# Patient Record
Sex: Female | Born: 1950
Health system: Southern US, Community
[De-identification: ages and names within clinical notes are randomized; demographics above are authoritative.]

## PROBLEM LIST (undated history)

## (undated) DIAGNOSIS — I10 Essential (primary) hypertension: Secondary | ICD-10-CM

## (undated) DIAGNOSIS — E039 Hypothyroidism, unspecified: Secondary | ICD-10-CM

## (undated) DIAGNOSIS — K589 Irritable bowel syndrome without diarrhea: Secondary | ICD-10-CM

## (undated) DIAGNOSIS — F32A Depression, unspecified: Secondary | ICD-10-CM

## (undated) DIAGNOSIS — K3184 Gastroparesis: Secondary | ICD-10-CM

## (undated) DIAGNOSIS — I2510 Atherosclerotic heart disease of native coronary artery without angina pectoris: Secondary | ICD-10-CM

## (undated) DIAGNOSIS — D649 Anemia, unspecified: Secondary | ICD-10-CM

## (undated) DIAGNOSIS — M1990 Unspecified osteoarthritis, unspecified site: Secondary | ICD-10-CM

## (undated) DIAGNOSIS — N289 Disorder of kidney and ureter, unspecified: Secondary | ICD-10-CM

## (undated) DIAGNOSIS — G8929 Other chronic pain: Secondary | ICD-10-CM

## (undated) DIAGNOSIS — R519 Headache, unspecified: Secondary | ICD-10-CM

## (undated) DIAGNOSIS — G2581 Restless legs syndrome: Secondary | ICD-10-CM

## (undated) DIAGNOSIS — L509 Urticaria, unspecified: Secondary | ICD-10-CM

## (undated) DIAGNOSIS — R011 Cardiac murmur, unspecified: Secondary | ICD-10-CM

## (undated) DIAGNOSIS — Z8719 Personal history of other diseases of the digestive system: Secondary | ICD-10-CM

## (undated) DIAGNOSIS — R413 Other amnesia: Secondary | ICD-10-CM

## (undated) DIAGNOSIS — E119 Type 2 diabetes mellitus without complications: Secondary | ICD-10-CM

## (undated) DIAGNOSIS — J449 Chronic obstructive pulmonary disease, unspecified: Secondary | ICD-10-CM

## (undated) DIAGNOSIS — I639 Cerebral infarction, unspecified: Secondary | ICD-10-CM

## (undated) DIAGNOSIS — G4733 Obstructive sleep apnea (adult) (pediatric): Secondary | ICD-10-CM

## (undated) DIAGNOSIS — G629 Polyneuropathy, unspecified: Secondary | ICD-10-CM

## (undated) DIAGNOSIS — Z8489 Family history of other specified conditions: Secondary | ICD-10-CM

## (undated) DIAGNOSIS — N189 Chronic kidney disease, unspecified: Secondary | ICD-10-CM

## (undated) DIAGNOSIS — M51369 Other intervertebral disc degeneration, lumbar region without mention of lumbar back pain or lower extremity pain: Secondary | ICD-10-CM

## (undated) DIAGNOSIS — K219 Gastro-esophageal reflux disease without esophagitis: Secondary | ICD-10-CM

## (undated) DIAGNOSIS — E785 Hyperlipidemia, unspecified: Secondary | ICD-10-CM

## (undated) DIAGNOSIS — K760 Fatty (change of) liver, not elsewhere classified: Secondary | ICD-10-CM

## (undated) DIAGNOSIS — Z9989 Dependence on other enabling machines and devices: Secondary | ICD-10-CM

## (undated) DIAGNOSIS — I219 Acute myocardial infarction, unspecified: Secondary | ICD-10-CM

## (undated) DIAGNOSIS — E1142 Type 2 diabetes mellitus with diabetic polyneuropathy: Secondary | ICD-10-CM

## (undated) DIAGNOSIS — M5126 Other intervertebral disc displacement, lumbar region: Secondary | ICD-10-CM

## (undated) DIAGNOSIS — I447 Left bundle-branch block, unspecified: Secondary | ICD-10-CM

## (undated) DIAGNOSIS — R51 Headache: Secondary | ICD-10-CM

## (undated) DIAGNOSIS — E1140 Type 2 diabetes mellitus with diabetic neuropathy, unspecified: Secondary | ICD-10-CM

## (undated) DIAGNOSIS — F419 Anxiety disorder, unspecified: Secondary | ICD-10-CM

## (undated) DIAGNOSIS — M545 Low back pain: Secondary | ICD-10-CM

## (undated) DIAGNOSIS — R0600 Dyspnea, unspecified: Secondary | ICD-10-CM

## (undated) DIAGNOSIS — M5136 Other intervertebral disc degeneration, lumbar region: Secondary | ICD-10-CM

## (undated) DIAGNOSIS — F319 Bipolar disorder, unspecified: Secondary | ICD-10-CM

## (undated) DIAGNOSIS — F329 Major depressive disorder, single episode, unspecified: Secondary | ICD-10-CM

## (undated) HISTORY — DX: Hypothyroidism, unspecified: E03.9

## (undated) HISTORY — DX: Other intervertebral disc degeneration, lumbar region without mention of lumbar back pain or lower extremity pain: M51.369

## (undated) HISTORY — DX: Type 2 diabetes mellitus without complications: E11.9

## (undated) HISTORY — DX: Irritable bowel syndrome, unspecified: K58.9

## (undated) HISTORY — DX: Polyneuropathy, unspecified: G62.9

## (undated) HISTORY — DX: Hyperlipidemia, unspecified: E78.5

## (undated) HISTORY — DX: Chronic obstructive pulmonary disease, unspecified: J44.9

## (undated) HISTORY — DX: Depression, unspecified: F32.A

## (undated) HISTORY — PX: RECTAL SURGERY: SHX760

## (undated) HISTORY — DX: Anemia, unspecified: D64.9

## (undated) HISTORY — DX: Headache: R51

## (undated) HISTORY — DX: Other chronic pain: G89.29

## (undated) HISTORY — DX: Bipolar disorder, unspecified: F31.9

## (undated) HISTORY — DX: Low back pain: M54.5

## (undated) HISTORY — DX: Dependence on other enabling machines and devices: Z99.89

## (undated) HISTORY — PX: NECK SURGERY: SHX720

## (undated) HISTORY — DX: Cerebral infarction, unspecified: I63.9

## (undated) HISTORY — PX: CHOLECYSTECTOMY: SHX55

## (undated) HISTORY — DX: Urticaria, unspecified: L50.9

## (undated) HISTORY — DX: Other intervertebral disc degeneration, lumbar region: M51.36

## (undated) HISTORY — DX: Obstructive sleep apnea (adult) (pediatric): G47.33

## (undated) HISTORY — PX: OTHER SURGICAL HISTORY: SHX169

## (undated) HISTORY — DX: Headache, unspecified: R51.9

## (undated) HISTORY — DX: Type 2 diabetes mellitus with diabetic polyneuropathy: E11.42

## (undated) HISTORY — DX: Other amnesia: R41.3

## (undated) HISTORY — DX: Gastro-esophageal reflux disease without esophagitis: K21.9

## (undated) HISTORY — DX: Restless legs syndrome: G25.81

## (undated) HISTORY — DX: Gastroparesis: K31.84

## (undated) HISTORY — DX: Unspecified osteoarthritis, unspecified site: M19.90

## (undated) HISTORY — DX: Morbid (severe) obesity due to excess calories: E66.01

## (undated) HISTORY — PX: SHOULDER SURGERY: SHX246

## (undated) HISTORY — PX: ABDOMINAL HYSTERECTOMY: SHX81

## (undated) HISTORY — DX: Other intervertebral disc displacement, lumbar region: M51.26

## (undated) HISTORY — DX: Major depressive disorder, single episode, unspecified: F32.9

---
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
## 1999-10-29 ENCOUNTER — Encounter: Payer: Self-pay | Admitting: Neurosurgery

## 1999-10-29 ENCOUNTER — Encounter: Admission: RE | Admit: 1999-10-29 | Discharge: 1999-10-29 | Payer: Self-pay | Admitting: Neurosurgery

## 2000-05-18 ENCOUNTER — Inpatient Hospital Stay (HOSPITAL_COMMUNITY): Admission: EM | Admit: 2000-05-18 | Discharge: 2000-05-20 | Payer: Self-pay | Admitting: Psychiatry

## 2000-05-20 ENCOUNTER — Encounter: Payer: Self-pay | Admitting: Psychiatry

## 2000-05-20 ENCOUNTER — Inpatient Hospital Stay: Admission: RE | Admit: 2000-05-20 | Discharge: 2000-05-24 | Payer: Self-pay | Admitting: Psychiatry

## 2000-05-21 ENCOUNTER — Encounter: Payer: Self-pay | Admitting: Internal Medicine

## 2000-05-22 ENCOUNTER — Encounter: Payer: Self-pay | Admitting: Pulmonary Disease

## 2000-05-24 ENCOUNTER — Inpatient Hospital Stay (HOSPITAL_COMMUNITY): Admission: EM | Admit: 2000-05-24 | Discharge: 2000-05-26 | Payer: Self-pay | Admitting: Psychiatry

## 2000-09-02 ENCOUNTER — Other Ambulatory Visit: Admission: RE | Admit: 2000-09-02 | Discharge: 2000-09-02 | Payer: Self-pay | Admitting: Obstetrics and Gynecology

## 2001-03-20 ENCOUNTER — Encounter: Payer: Self-pay | Admitting: Otolaryngology

## 2001-03-20 ENCOUNTER — Ambulatory Visit (HOSPITAL_COMMUNITY): Admission: RE | Admit: 2001-03-20 | Discharge: 2001-03-20 | Payer: Self-pay | Admitting: Otolaryngology

## 2001-08-12 ENCOUNTER — Ambulatory Visit (HOSPITAL_COMMUNITY): Admission: RE | Admit: 2001-08-12 | Discharge: 2001-08-12 | Payer: Self-pay | Admitting: Cardiology

## 2001-09-18 ENCOUNTER — Ambulatory Visit (HOSPITAL_COMMUNITY): Admission: RE | Admit: 2001-09-18 | Discharge: 2001-09-18 | Payer: Self-pay | Admitting: General Surgery

## 2001-09-25 ENCOUNTER — Encounter: Payer: Self-pay | Admitting: Emergency Medicine

## 2001-09-25 ENCOUNTER — Emergency Department (HOSPITAL_COMMUNITY): Admission: EM | Admit: 2001-09-25 | Discharge: 2001-09-25 | Payer: Self-pay | Admitting: Emergency Medicine

## 2001-10-02 ENCOUNTER — Emergency Department (HOSPITAL_COMMUNITY): Admission: EM | Admit: 2001-10-02 | Discharge: 2001-10-02 | Payer: Self-pay | Admitting: Emergency Medicine

## 2001-10-22 ENCOUNTER — Encounter: Admission: RE | Admit: 2001-10-22 | Discharge: 2002-01-20 | Payer: Self-pay | Admitting: Pulmonary Disease

## 2001-11-18 ENCOUNTER — Ambulatory Visit (HOSPITAL_BASED_OUTPATIENT_CLINIC_OR_DEPARTMENT_OTHER): Admission: RE | Admit: 2001-11-18 | Discharge: 2001-11-18 | Payer: Self-pay | Admitting: Otolaryngology

## 2002-02-03 ENCOUNTER — Ambulatory Visit (HOSPITAL_COMMUNITY): Admission: RE | Admit: 2002-02-03 | Discharge: 2002-02-03 | Payer: Self-pay | Admitting: Pulmonary Disease

## 2002-04-09 ENCOUNTER — Ambulatory Visit (HOSPITAL_COMMUNITY): Admission: RE | Admit: 2002-04-09 | Discharge: 2002-04-09 | Payer: Self-pay | Admitting: Pulmonary Disease

## 2002-05-01 ENCOUNTER — Emergency Department (HOSPITAL_COMMUNITY): Admission: EM | Admit: 2002-05-01 | Discharge: 2002-05-01 | Payer: Self-pay | Admitting: Emergency Medicine

## 2002-11-18 ENCOUNTER — Ambulatory Visit (HOSPITAL_COMMUNITY): Admission: RE | Admit: 2002-11-18 | Discharge: 2002-11-18 | Payer: Self-pay | Admitting: Pulmonary Disease

## 2003-01-18 ENCOUNTER — Emergency Department (HOSPITAL_COMMUNITY): Admission: EM | Admit: 2003-01-18 | Discharge: 2003-01-18 | Payer: Self-pay | Admitting: Emergency Medicine

## 2003-02-16 ENCOUNTER — Ambulatory Visit (HOSPITAL_COMMUNITY): Admission: RE | Admit: 2003-02-16 | Discharge: 2003-02-16 | Payer: Self-pay | Admitting: Internal Medicine

## 2003-03-04 ENCOUNTER — Ambulatory Visit (HOSPITAL_COMMUNITY): Admission: RE | Admit: 2003-03-04 | Discharge: 2003-03-04 | Payer: Self-pay | Admitting: Internal Medicine

## 2003-05-03 ENCOUNTER — Ambulatory Visit (HOSPITAL_COMMUNITY): Admission: RE | Admit: 2003-05-03 | Discharge: 2003-05-03 | Payer: Self-pay | Admitting: Internal Medicine

## 2003-08-09 ENCOUNTER — Ambulatory Visit (HOSPITAL_COMMUNITY): Admission: RE | Admit: 2003-08-09 | Discharge: 2003-08-09 | Payer: Self-pay | Admitting: Pulmonary Disease

## 2003-09-02 ENCOUNTER — Ambulatory Visit: Admission: RE | Admit: 2003-09-02 | Discharge: 2003-09-02 | Payer: Self-pay | Admitting: Otolaryngology

## 2003-12-30 ENCOUNTER — Ambulatory Visit (HOSPITAL_COMMUNITY): Admission: RE | Admit: 2003-12-30 | Discharge: 2003-12-30 | Payer: Self-pay | Admitting: Obstetrics and Gynecology

## 2004-08-31 ENCOUNTER — Ambulatory Visit (HOSPITAL_COMMUNITY): Admission: RE | Admit: 2004-08-31 | Discharge: 2004-08-31 | Payer: Self-pay | Admitting: Pulmonary Disease

## 2004-09-21 ENCOUNTER — Encounter: Admission: RE | Admit: 2004-09-21 | Discharge: 2004-09-21 | Payer: Self-pay | Admitting: Pulmonary Disease

## 2004-10-09 ENCOUNTER — Encounter: Admission: RE | Admit: 2004-10-09 | Discharge: 2004-10-09 | Payer: Self-pay | Admitting: Pulmonary Disease

## 2005-01-21 ENCOUNTER — Ambulatory Visit: Payer: Self-pay | Admitting: Urgent Care

## 2005-02-08 ENCOUNTER — Ambulatory Visit (HOSPITAL_COMMUNITY): Admission: RE | Admit: 2005-02-08 | Discharge: 2005-02-08 | Payer: Self-pay | Admitting: Obstetrics and Gynecology

## 2005-06-12 ENCOUNTER — Inpatient Hospital Stay (HOSPITAL_COMMUNITY): Admission: AD | Admit: 2005-06-12 | Discharge: 2005-06-13 | Payer: Self-pay | Admitting: Cardiology

## 2005-06-12 ENCOUNTER — Ambulatory Visit: Payer: Self-pay | Admitting: Cardiology

## 2005-06-19 ENCOUNTER — Ambulatory Visit: Payer: Self-pay | Admitting: Cardiology

## 2005-06-19 ENCOUNTER — Encounter (HOSPITAL_COMMUNITY): Admission: RE | Admit: 2005-06-19 | Discharge: 2005-07-19 | Payer: Self-pay | Admitting: *Deleted

## 2005-07-01 ENCOUNTER — Ambulatory Visit: Payer: Self-pay | Admitting: Internal Medicine

## 2005-07-02 ENCOUNTER — Ambulatory Visit (HOSPITAL_COMMUNITY): Admission: RE | Admit: 2005-07-02 | Discharge: 2005-07-02 | Payer: Self-pay | Admitting: Cardiology

## 2005-07-02 ENCOUNTER — Ambulatory Visit: Payer: Self-pay | Admitting: Cardiology

## 2005-09-05 ENCOUNTER — Ambulatory Visit (HOSPITAL_COMMUNITY): Admission: RE | Admit: 2005-09-05 | Discharge: 2005-09-05 | Payer: Self-pay | Admitting: Pulmonary Disease

## 2006-01-15 ENCOUNTER — Encounter: Admission: RE | Admit: 2006-01-15 | Discharge: 2006-01-15 | Payer: Self-pay

## 2006-01-23 ENCOUNTER — Ambulatory Visit: Payer: Self-pay | Admitting: Internal Medicine

## 2006-04-24 ENCOUNTER — Ambulatory Visit: Payer: Self-pay | Admitting: Internal Medicine

## 2006-06-25 ENCOUNTER — Encounter: Admission: RE | Admit: 2006-06-25 | Discharge: 2006-06-25 | Payer: Self-pay | Admitting: Pulmonary Disease

## 2006-07-08 ENCOUNTER — Encounter: Admission: RE | Admit: 2006-07-08 | Discharge: 2006-07-08 | Payer: Self-pay | Admitting: Pulmonary Disease

## 2006-07-21 ENCOUNTER — Ambulatory Visit (HOSPITAL_COMMUNITY): Admission: RE | Admit: 2006-07-21 | Discharge: 2006-07-21 | Payer: Self-pay | Admitting: Pulmonary Disease

## 2007-07-22 ENCOUNTER — Ambulatory Visit (HOSPITAL_COMMUNITY): Admission: RE | Admit: 2007-07-22 | Discharge: 2007-07-22 | Payer: Self-pay | Admitting: Pulmonary Disease

## 2008-03-09 ENCOUNTER — Ambulatory Visit (HOSPITAL_COMMUNITY): Admission: RE | Admit: 2008-03-09 | Discharge: 2008-03-09 | Payer: Self-pay | Admitting: Pulmonary Disease

## 2008-04-01 ENCOUNTER — Ambulatory Visit: Payer: Self-pay | Admitting: Cardiology

## 2008-04-01 DIAGNOSIS — E785 Hyperlipidemia, unspecified: Secondary | ICD-10-CM

## 2008-04-01 DIAGNOSIS — I10 Essential (primary) hypertension: Secondary | ICD-10-CM | POA: Insufficient documentation

## 2008-04-01 DIAGNOSIS — E1169 Type 2 diabetes mellitus with other specified complication: Secondary | ICD-10-CM

## 2008-04-01 DIAGNOSIS — G4733 Obstructive sleep apnea (adult) (pediatric): Secondary | ICD-10-CM | POA: Insufficient documentation

## 2008-04-01 DIAGNOSIS — I447 Left bundle-branch block, unspecified: Secondary | ICD-10-CM | POA: Insufficient documentation

## 2008-09-14 ENCOUNTER — Other Ambulatory Visit: Admission: RE | Admit: 2008-09-14 | Discharge: 2008-09-14 | Payer: Self-pay | Admitting: Obstetrics and Gynecology

## 2009-07-06 ENCOUNTER — Ambulatory Visit (HOSPITAL_COMMUNITY)
Admission: RE | Admit: 2009-07-06 | Discharge: 2009-07-06 | Payer: Self-pay | Source: Home / Self Care | Admitting: Pulmonary Disease

## 2010-03-26 ENCOUNTER — Ambulatory Visit: Payer: Self-pay | Admitting: Internal Medicine

## 2010-04-10 ENCOUNTER — Ambulatory Visit (HOSPITAL_COMMUNITY)
Admission: RE | Admit: 2010-04-10 | Discharge: 2010-04-10 | Payer: Self-pay | Source: Home / Self Care | Attending: Internal Medicine | Admitting: Internal Medicine

## 2010-04-10 ENCOUNTER — Ambulatory Visit: Payer: Self-pay | Admitting: Internal Medicine

## 2010-05-05 ENCOUNTER — Encounter: Payer: Self-pay | Admitting: Pulmonary Disease

## 2010-05-06 ENCOUNTER — Encounter: Payer: Self-pay | Admitting: Pulmonary Disease

## 2010-06-25 LAB — IRON AND TIBC
Iron: 18 ug/dL — ABNORMAL LOW (ref 42–135)
Saturation Ratios: 4 % — ABNORMAL LOW (ref 20–55)
TIBC: 468 ug/dL (ref 250–470)
UIBC: 450 ug/dL

## 2010-06-25 LAB — VITAMIN B12: Vitamin B-12: 223 pg/mL (ref 211–911)

## 2010-06-25 LAB — FOLATE RBC: RBC Folate: 482 ng/mL (ref 180–600)

## 2010-06-25 LAB — GLUCOSE, CAPILLARY: Glucose-Capillary: 170 mg/dL — ABNORMAL HIGH (ref 70–99)

## 2010-08-28 NOTE — Letter (Signed)
April 01, 2008    Ramon Dredge L. Juanetta Gosling, MD  [address]  [place]  [place] [address]   RE:  BRADY, PLANT  MRN:  [id number]  /  DOB:  September 26, 1950   Dear. Ed:   It was my pleasure reevaluating Ms. Hannon in the office today for EKG  abnormalities.  Her cardiac history dates to 1989 when she underwent  cardiac catheterization for chest discomfort, and was found to have  normal coronaries.  A stress nuclear study was performed in 2007 and was  negative.  She was first noted to have a left bundle branch block in  2003.  She describes no recent cardiopulmonary symptoms.  She was  undergoing a GI procedure at [facility], for which a routine EKG  was obtained.  She was told that she has a long QT interval.   An EKG tracing was obtained.  She continues to have a left bundle branch  block.  Her corrected QT interval is 517 milliseconds.  Her JT interval  is only 240 milliseconds.  Compared with a prior tracing obtained in  2003, the QRS axis has shifted rightward; there is no change in the QT  interval or in the remainder of the tracing.   IMPRESSION:  Shruti is doing well from a symptomatic standpoint.  She  has a long-standing left bundle branch block that is probably an  electrical abnormality, not reflecting significant structural heart  disease.  There is no reason to think that she has developed coronary  artery disease or cardiomyopathy.   The QT prolongation is primarily related to her left bundle branch  block.  This is unchanged over the past 6 years.  No further assessment  and certainly no treatment is warranted.   It was nice to see Caroljean again.  Please call me at any time that I can  be of assistance in her care.    Sincerely,      Gerrit Friends. Dietrich Pates, MD, [facility]  Electronically Signed    RMR/MedQ  DD: 04/01/2008  DT: 04/02/2008  Job #: [id number]

## 2010-08-28 NOTE — Procedures (Signed)
NAMESAMAURI, Norma [name]               ACCOUNT NO.:  [id number]   MEDICAL RECORD NO.:  [id number]          PATIENT TYPE:  OUT   LOCATION:  RESP                          FACILITY:  [facility]   PHYSICIAN:  Edward L. Juanetta Gosling, M.D.DATE OF BIRTH:  02/11/1951   DATE OF PROCEDURE:  DATE OF DISCHARGE:  03/09/2008                            PULMONARY FUNCTION TEST   Spirometry is normal.      Edward L. Juanetta Gosling, M.D.  Electronically Signed     ELH/MEDQ  D:  03/21/2008  T:  03/21/2008  Job:  [id number]

## 2010-08-31 NOTE — Discharge Summary (Signed)
[facility]  Patient:    Norma [name], Norma [name]                      MRN: [id number] Adm. Date:  [id number] Disc. Date: 05/26/00 Attending:  Samule Dry                           Discharge Summary  GENERAL MEDICAL FINDINGS:  None.  Please see the recent medical admission.  HOSPITAL COURSE:  Norma [name] was admitted to the inpatient psychiatric unit of [facility] and underwent milieu and group psychotherapy. Her Topamax was increased to 150 mg p.o. q.h.s. for mood stabilization.  She was continued on her antidepressant trial of Effexor XR 300 mg q.a.m. and Remeron 30 mg q.h.s.  She did recall Ativan 1 mg 1/2 to 1 t.i.d. for acute anxiety which worked well.  She was also continued on her Zyprexa 10 mg q.h.s. for antimania and mood stabilization.  CONDITION ON DISCHARGE:  By February 11, Norma [name] was cooperative and socially appropriate.  Her energy had increased to 70%.  Interest had increased to 60%.   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  Appetite was within normal limits.  She was not experiencing any racing thoughts, no hallucinations, no euphoria.  MENTAL STATUS EXAMINATION:  Upon discharge, Norma [name] is alert, oriented to all spheres.  Thought processes were logical and coherent, goal directed.  No looseness of associations.  Thought content: No thoughts of harming herself, no thoughts of harming others.  No delusions.  No hallucinations.  Memory within normal limits.  Concentration within normal limits.  Affect broad and appropriate.  Judgment within normal limits.  DISCHARGE DIAGNOSES: Axis I:     1. Bipolar 2 disorder, depressed, in partial remission.             2. Anxiety disorder not otherwise specified. Axis II:    Deferred. Axis III:   Vascular headache, hypertension, recent pneumonia. Axis IV:    Occupational, economic. Axis V:      60.  DISCHARGE PLAN:  Patient agrees to not drive if drowsy and to call emergency services for distress, thoughts of harming self, others.  DISCHARGE MEDICATIONS: 1.  Ativan 1/2 to 1 t.i.d. p.r.n. 2.  Asafex 20 mg 1 q.d. 3.  Zyprexa 10 mg 1  q.h.s. 4.  Topamax 150 mg p.o. q.h.s. 5.  Synthroid 200 mcg p.o. q.d. 6.  Lipitor 10 mg p.o. q.d. 7.  Effexor XR 300 mg p.o. q.d. 8.  Remeron 30 mg p.o. q.h.s. 9.  Demadex 20 mg p.o. q.a.m. 10. Tequin 400 mg p.o. q.d. x 4 days. 11. Afrin nasal p.r.n., use as directed. 12. Vioxx 50 mg p.o. q.d. 12. Ortho-Est.  ACTIVITY:  No restrictions.  DIET:  1550 KCal per day, low sodium.  FOLLOW UP:  Dr. Jeanie Sewer for psychotropic medication management on May 29, 2000 at 11 a.m.  Dr. Juanetta Gosling for primary care follow up on February 12. DD:  05/26/00 TD:  05/26/00 Job: [id number]

## 2010-08-31 NOTE — Procedures (Signed)
Norma [name], Norma [name]                         ACCOUNT NO.:  [id number]   MEDICAL RECORD NO.:  [id number]                   PATIENT TYPE:  OUT   LOCATION:  RESP                                 FACILITY:  [facility]   PHYSICIAN:  Edward L. Juanetta Gosling, M.D.             DATE OF BIRTH:  July 20, 1950   DATE OF PROCEDURE:  DATE OF DISCHARGE:  08/09/2003                              PULMONARY FUNCTION TEST   FINDINGS:  1. Spirometry is normal.  2. Lung volumes are normal.  3. DLCO is moderately reduced.  4. Arterial blood gasses are normal.      ___________________________________________                                            Oneal Deputy. Juanetta Gosling, M.D.   ELH/MEDQ  D:  08/10/2003  T:  08/11/2003  Job:  [id number]

## 2010-08-31 NOTE — Op Note (Signed)
[facility]  Patient:    Norma [name], Norma [name] Visit Number: [id number] MRN: [id number]          Service Type: EMS Location: ED Attending Physician:  Annamarie Dawley Dictated by:   Elpidio Anis, M.D. Proc. Date: 09/18/01 Admit Date:  10/02/2001 Discharge Date: 10/02/2001                             Operative Report  PREOPERATIVE DIAGNOSIS:  Lipoma of the upper back 15 x 8 cm.  POSTOPERATIVE DIAGNOSIS:  Lipoma of the upper back 15 x 8 cm.  PROCEDURE:  Excision of lipoma of the back 15 x 8 cm.  SURGEON:  Elpidio Anis, M.D.  INDICATIONS FOR PROCEDURE:  The patient is a 60 year old mildly obese female who has had a developing mass of the upper back at the base of her neck for quite some time. It has become very noticeable. It is so large now that she cannot button her clothes because of the size and she wishes to have it excised under anesthesia.  DESCRIPTION OF PROCEDURE:  Under general anesthesia, the patient was positioned in the left lateral decubitus position. Her back and neck were prepped and draped in a sterile field. A midline longitudinal incision was made over the mass and extended through the subcutaneous tissue. In the subcutaneous tissue, the large fatty mass was excised. It was quite thick and there was a large space created with the excision of the mass. It was not as demarcated as would be expected of a mature lipoma but it was evident and separate from her surrounding fat. The mass was completely excised without difficulty. Hemostasis was achieved. A JP drain was placed. The edges of the subcutaneous tissue and skin were sutured to the underlying fatty tissue and fascia with #0 Biosyn. The midline portion of the skin flaps were closed over the drain using 2-0 and 3-0 Biosyn. The skin was closed with staples. The drain was secured with 3-0 nylon. A dressing was placed. The patient was turned to the supine position on the stretcher,  extubated without difficulty and transferred to the post anesthesia care unit for further monitoring. Dictated by:   Elpidio Anis, M.D. Attending Physician:  Annamarie Dawley DD:  10/27/01 TD:  10/30/01 Job: [id number]

## 2010-08-31 NOTE — Op Note (Signed)
[facility]  Patient:    Norma [name], Norma [name]                      MRN: [id number] Proc. Date: 05/20/00 Adm. Date:  [id number] Attending:  Samule Dry CC:         Shaune Pollack, M.D., [place], [place]  Adelene Amas. Williford, M.D.   Operative Report  FINAL DIAGNOSES:  1. Bilateral aspiration pneumonia with probable mild adult respiratory     distress syndrome.  2. Depression with suicidal attempt.  3. Hyperlipidemia.  4. Hypothyroidism.  5. Gastroesophageal reflux disease.  6. Morbid obesity.  7. Probable obstructive sleep apnea.  DISCHARGE MEDICATIONS:  1. Protonix 40 mg daily.  2. Zyprexa 10 mg at night.  3. Topamax 100 mg at night.  4. ______ 200 mcg daily.  5. Zocor 20 mg at night.  6. Effexor 300 mg daily.  7. Remeron 30 mg one at night.  8. Demodex 20 mg in the morning.  9. Tequin 400 mg daily for six days.  DISCHARGE ACTIVITY:  As tolerated.  DISCHARGE DIET:  1500 calories a day.  SPECIAL INSTRUCTIONS:  Call if problems. Follow-up with Dr. Juanetta Gosling in one week.  CONSULTATIONS:  Dr. Williford/psychiatry, Dr. Wyn Quaker.  SPECIAL PROCEDURES:  Chest CT scan.  HISTORY OF PRESENT ILLNESS:  The patient is a 60 year old morbidly obese female who was admitted after transfer from [facility] with bilateral pneumonia and hypoxemia. Further details were addressed to my history and physical on May 20, 2000.  HOSPITAL COURSE:  During the course of hospitalization, the patient was treated in the Med/Psyche unit. She was placed on oxygen and IV Tequin. She was treated with diuretics. In order to rule out underlying pulmonary disease, a CAT scan was done and the findings were consistent with multifocal polylobar air space lung densities. Subsequent chest x-ray showed the improvement of the infiltrate. During the course of hospitalization, her condition has steadily improved. On May 24, 2000, she was ready for either  discharge home or transfer to [facility]. The decision is to be made by Dr. Jeanie Sewer.  PHYSICAL EXAMINATION:  GENERAL:  The date of discharge, she is feeling well. She is in no acute distress.  HEENT:  Moist mucosa.  LUNGS:  Decreased breath sounds, no wheezes or rales.  HEART:  S1, S2, no murmur, no gallop.  ABDOMEN: Obese, soft, nontender.  EXTREMITIES:  Lower extremities without edema. She is alert, oriented and cooperative.  VITAL SIGNS:  Temperature 97.9, respirations 20, heart rate 98, blood pressure 103/55, O2 sat 93% on room air.  LABORATORY DATA:   White count on admission 19.9 with hemoglobin 11.0. Weight 422, sed rate 67, TSH 3.01, liver tests normal. Glucose 132-124. Sodium 142. HIV nonreactive. Rh factor 26 (normal between 0 and 20). ANA negative. LDH 202 normal.  Chest x-ray bilateral infiltrate. The chest x-ray on February 7 revealed improvement. CT scan of the chest showed multifocal polylobar air space lung densities. Pneumonia is differential diagnosis. Air pattern was not suggestive of DIP. DD:  05/24/00 TD:  05/26/00 Job: [id number]

## 2010-08-31 NOTE — H&P (Signed)
NAMEKIKUYE, KORENEK               ACCOUNT NO.:  [id number]   MEDICAL RECORD NO.:  [id number]          PATIENT TYPE:  INP   LOCATION:  3729                         FACILITY:  [facility]   PHYSICIAN:  [place] Bing, M.D. [facility] OF BIRTH:  Feb 11, 1951   DATE OF ADMISSION:  06/12/2005  DATE OF DISCHARGE:                                HISTORY & PHYSICAL   REFERRING PHYSICIAN:  Dr. Juanetta Gosling.   PRIMARY CARDIOLOGIST:  Dr. Daleen Squibb.   HISTORY OF PRESENT ILLNESS:  A 60 year old woman with multiple  cardiovascular risk factors but no known coronary disease presents with  recurrent chest discomfort.  Ms. Bazaldua was first seen by our group in the  1980s, when she developed left bundle branch block during a stress test.  Coronary angiography at that time was normal.  She has had subsequent stress  testing, in 1996 and 2003, both times without evidence for ischemia or  infarction.  She was doing well until this morning, when she noted the onset  of sharp intermittent mild to moderate mid substernal discomfort without  radiation or associated symptoms.  This occurred at rest.  There was no  relationship to exertion, movement of the trunk or upper extremities, or  respiration.  When symptoms had persisted for more than an hour, she decided  that evaluation in the emergency department would be appropriate.  She was  found to have her usual left bundle branch block.  Initial cardiac markers  were negative.  She was referred to [facility] from [facility] for further assessment.   1.  There is a 4-year history of diabetes treated with oral medications.  2.  Hyperlipidemia was recently discovered and has been successfully treated      with a Statin.  3.  There is a remote and modest history of tobacco use.   PAST MEDICAL HISTORY:  Is otherwise notable for:  1.  Bipolar disease.  2.  Arthritis.  3.  Fibromyalgia.  4.  Sleep apnea.  5.  Irritable bowel syndrome.  6.  GERD.  7.   Migraines.  8.  Chronic back pain.  9.  Obesity.  10. Hypothyroidism.  11. Neuropathy.   The patient has had adverse reactions to:  IMITREX.  LORTAB.   CURRENT MEDICATIONS:  1.  Avandomet 2/500 mg every day.  2.  Effexor 300 mg q.h.s.  3.  Lamictal 150 mg every day.  4.  Protonix 40 mg b.i.d.  5.  Levo-thyroxine 0.1 mg every day.  6.  Reglan 20 mg every day.  7.  Rosuvastatin 40 mg every day.   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  Married and lives with family.  No excessive use of alcohol.   FAMILY HISTORY:  Mother has hypertension.  No prominent family history for  coronary disease.   REVIEW OF SYSTEMS:  Notable for occasional numbness in the feet, pain in  multiple joints, intermittent wheezing with dyspnea on exertion.  She has  frequent headaches.  All other systems reviewed and are negative.   PHYSICAL  EXAMINATION:  GENERAL:  Overweight, pleasant woman in no acute  distress.  VITAL SIGNS:  The temperature is 98.7, heart rate 88 and regular,  respirations 18, blood pressure 120/60, weight 255, O2 saturation 97% on  room air.  HEENT:  Anicteric sclerae.  EOMs full.  Normal oral mucosa.  NECK:  No jugular venous distension.  Normal carotid upstrokes without  bruits.  LUNGS:  Clear.  CARDIAC:  Distant first and second heart sounds.  Fourth heart sound  present.  ABDOMEN:  Soft and nontender.  Normal bowel sounds.  No masses.  No  organomegaly.  EXTREMITIES:  Normal distal pulses.  No edema.  SKIN:  Scar in the upper back related to previous resection of a fatty  tumor. Lipoma over the left ankle.  NEUROLOGIC:  Symmetric strength and tone.  Normal cranial nerves.   Chest x-ray:  No acute abnormalities.   EKG:  Left bundle branch block.  Sinus rhythm.   Basic laboratory studies:  Normal including cardiac markers and BNP.   IMPRESSION:  Despite multiple cardiovascular risk factors, Ms. Lockamy  presents with atypical  chest pain, a benign exam and multiple previous  negative workups for coronary disease.  Her chest pain that is quite  nonspecific.  I would consider possible coronary spasm and esophageal spasm.  Amlodipine 5 mg every day will be added empirically to her medical regimen.  If chest pain resolves and additional cardiac markers are negative, as  expected, we will plan an outpatient pharmacologic stress nuclear study in  [place] with followup in our [place] office.      [place] Bing, M.D. [facility]  Electronically Signed     RR/MEDQ  D:  06/12/2005  T:  06/12/2005  Job:  [id number]   cc:   Ramon Dredge L. Juanetta Gosling, M.D.  Fax: [phone]

## 2010-08-31 NOTE — Procedures (Signed)
NAMECHANTIA, Norma [name]               ACCOUNT NO.:  [id number]   MEDICAL RECORD NO.:  [id number]          PATIENT TYPE:  OUT   LOCATION:  RAD                           FACILITY:  [facility]   PHYSICIAN:  Olga Millers, M.D. LHCDATE OF BIRTH:  1950/05/23   DATE OF PROCEDURE:  07/02/2005  DATE OF DISCHARGE:                                  ECHOCARDIOGRAM   DATE OF STUDY:  July 02, 2005.   CLINICAL HISTORY:  The patient is complaining of dyspnea and has a history  of diabetes mellitus.  This study is performed to quantify left ventricular  function.   TECHNICAL DESCRIPTION:  It is technically difficult.   TWO-DIMENSIONAL MEASUREMENTS ARE AS FOLLOWS:  The left ventricular end-  diastolic dimension is 4.1 cm.  The left ventricular end-systolic dimension  is 3.4 cm.  The aorta is 2.5 cm and the left atrium is 4.3 cm.  The septum  is 1.5 cm and the posterior wall is 1.4 cm.   The overall left ventricular function appears to be low normal with an  estimated ejection fraction at 50-55%.  It is difficult to quantitate due to  the patient's abnormal septal motion.  The septal motion is abnormal due to  a conduction delay.  There is mild concentric left ventricular hypertrophy.  The left atrium is mildly enlarged.  The right atrium and right ventricle  are normal in size and the right ventricular function is normal.  There is  no significant pericardial effusion.   The aortic valve is trileaflet and there is no aortic stenosis or aortic  insufficiency.  The mitral valve is mildly thickened and there is trace  mitral regurgitation.  There is no significant tricuspid regurgitation.   FINAL INTERPRETATION:  1.  Probable low normal left ventricular function.  2.  Abnormal septal motion consistent with conduction abnormality.  3.  Mild left atrial enlargement.  4.  Mild concentric left ventricular hypertrophy.  5.  Trace mitral regurgitation.           ______________________________  Olga Millers, M.D. [facility]     BC/MEDQ  D:  07/02/2005  T:  07/03/2005  Job:  [id number]

## 2010-08-31 NOTE — Procedures (Signed)
NAMELEZLY, RUMPF               ACCOUNT NO.:  [id number]   MEDICAL RECORD NO.:  [id number]          PATIENT TYPE:  OUT   LOCATION:  RESP                          FACILITY:  [facility]   PHYSICIAN:  Edward L. Juanetta Gosling, M.D.DATE OF BIRTH:  01-26-1951   DATE OF PROCEDURE:  07/21/2006  DATE OF DISCHARGE:                            PULMONARY FUNCTION TEST   1. Spirometry is normal.  2. Lung volumes are normal.  3. DLCO is moderately reduced.  4. Arterial blood gases are normal.  5. There is little change in comparison to previous test of 2005.      Edward L. Juanetta Gosling, M.D.  Electronically Signed     ELH/MEDQ  D:  07/21/2006  T:  07/22/2006  Job:  [id number]

## 2010-08-31 NOTE — Discharge Summary (Signed)
[facility]  Patient:    Norma [name], Norma [name]                      MRN: [id number] Adm. Date:  [id number] Disc. Date: [id number] Attending:  Samule Dry                           Discharge Summary  GENERAL MEDICAL FINDINGS:  Please see the description below.  HOSPITAL COURSE:  [name] was admitted to the inpatient psychiatric unit at [facility] and underwent milieu and group psychotherapy. Her Remeron was started at 15 mg q.h.s.  Pindolol was discontinued.  Serzone was tapered off.  Effexor was continued with Remeron for synergism.  The patient remained with the same depressive symptoms at the time of discharge. Please see the admission dictation.  CONDITION ON DISCHARGE:  By February 5 at midday the patient had redeveloped shortness of breath.  Her O2 saturation was 85-87% on room air.  The nurses examination revealed rhonchi in the upper lobes.  Her respiration rate was at 32.  She was continuing with a depressed mood.  She was having shortness of breath sitting as well.  The patient had to be transferred to [facility] for further evaluation and treatment of a possible recurrence of pneumonia.  DISCHARGE DIAGNOSES: Axis I:    Bipolar 2 disorder, depressed. Axis II:   Deferred. Axis III:  1. Likely recurrent pneumonia.            2. Hypothyroidism.            3. Abdominal adhesions.            4. Hypertension.            5. Vascular headaches.            6. History of iron deficiency anemia.            7. Fibromyalgia. Axis IV:   Occupational, economic, medical. Axis V:    35.  DISCHARGE PLAN:  The patient will be transferred to the [facility] Emergency Room for further evaluation and treatment of likely recurrent pneumonia. DD:  06/04/00 TD:  06/05/00 Job: [id number]

## 2010-08-31 NOTE — H&P (Signed)
[facility]  Patient:    Norma [name], Norma [name]                      MRN: [id number] Adm. Date:  [id number] Attending:  Samule Dry                         History and Physical  REASON FOR ADMISSION:  Ms. Norma [name] is a 60 year old single white female admitted for further evaluation and treatment of severe depression.  HISTORY OF PRESENT ILLNESS:  Ms. Norma [name] mood has fallen dramatically over the past two weeks with the onset of depressed mood, anhedonia, poor energy, poor concentration and suicidal thoughts.  At the advice of the undersigned on May 13, 2000, the patient went to the emergency room of her local hospital with suicidal thoughts.  She stated she waited over an hour to be seen.  She then went out to her car and took several medications in an overdose.  She was then discovered by her mother at home to be behaving abnormally.  She was brought to the hospital and discovered to have pneumonia.  She was treated for her overdose as well as pneumonia.  Then she was readmitted to this psychiatric unit for further evaluation and treatment of her mood state.  PAST PSYCHIATRIC HISTORY:  Ms. Norma [name] has a history of elevated mood states, lasting more than one week in the past, starting in approximately her 14s, that involved euphoria, racing thoughts and spending sprees.  Also, decreased need for sleep.  These periods have occurred at a rate of about 3-4 per year. Most of her life has involved depressed mood states with several severe depressions lasting longer than two weeks.  The patient has attempted suicide previously.  She has had three psychiatric admission. Her biologic trials have included Prozac, Zoloft, Celexa, Paxil, Effexor, Serzone and Wellbutrin.  Her mood stabilizer trials have included Depakote and lithium.  Her response to electroconvulsive therapy was good.  SOCIAL HISTORY:  Ms. Norma [name] was born in [place], [place]. Her  siblings include one brother and two sisters.  She denies ever being abused.  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  She has never been married.  She has no children.  She does have her nephews over quite often and has been a second mother to them.  Currently, one of her nephews who is 29 years old has been living in her house and she has been caring for her two children as much as she can.  FAMILY PSYCHIATRIC HISTORY:  The patient states her mother has had major depression but has never been treated for it.  ALCOHOL AND ILLEGAL SUBSTANCE HISTORY:  None.  Tobacco - none.  GENERAL MEDICAL PROBLEMS:  Recent pneumonia, hypothyroidism, abdominal adhesions with a history of three abdominal surgeries for various problems including ovarian cyst, hysterectomy and evisceration.  She also has a history of hypertension, vascular headaches and a history of iron deficiency anemia. She has no history of seizures, coronary artery disease symptoms or findings.  She has a history also of fibromyalgia ad obstructive sleep apnea.  ADMISSION MEDICATIONS:  Augmentin 875 mg p.o. b.i.d., Serzone 400 mg p.o. q.h.s., Zyprexa 10 mg p.o. q.h.s., Topamax 100 mg q.h.s., Synthroid 0.2  mg p.o. q.d., Aciphex 20 mg p.o. q.d., Vioxx 50 mg p.o. q.d., Pendalol 10 mg p.o. q.d., Lipitor 10 mg p.o. q.h.s. Effexor XR 150 mg p.o. q.d., Afrin 2 sprays each nostril b.i.d. x 3 days, Ativan 1 mg p.o. q.4h. p.r.n., Vicodin 1 p.o. q.4h. p.r.n.  DRUG ALLERGIES:  IMITREX and LORTAB.  REVIEW OF SYSTEMS:  SKIN:  No rashes or erythema.  HEAD:  No recent trauma. EENT:  Hearing and vision within normal limits, no sore throat.  ENDOCRINE: No heat or cold intolerance.  RESPIRATORY:  No coughing or wheezing. CARDIOVASCULAR:  No chest pain, palpitations or  edema.  GASTROINTESTINAL:  No nausea, vomiting or diarrhea.  NEUROLOGIC:  No history of seizures, no focal motor or sensory problems.  GENITOURINARY:  No dysuria.  PHYSICAL EXAMINATION:  VITAL SIGNS:  Temperature 97.1, pulse 83, respirations 22, blood pressure 147/79.  Please see the general medical workup from [facility].  MENTAL STATUS EXAMINATION:  Ms. Norma [name] is alert.  She is oriented to all spheres.  Her speech is within normal limits.  Her affect is constricted. Thought content: "I dont want to live."  No hallucinations, no delusions, no thoughts of harming others.  Thought process is logical, coherent and goal-directed, no looseness of associations.  Judgment is questionable, however, she does have the capacity for medication informed consent.  ADMITTING DIAGNOSES: Axis I:    Bipolar II disorder, depressed. Axis II:   Deferred. Axis III:  1. Recent pneumonia.            2. Hypothyroidism.            3. Abdominal adhesions.            4. Hypertension.            5. Vascular headaches.            6. Fibromyalgia.            7. Sleep apnea. Axis IV:   Occupational problems, economic problems, medical problems. Axis V:    Global assessment of functioning 35.  INITIAL PLAN: 1. The indications, alternative and adverse effects of Remeron were discussed    with the patient for augmenting Effexor and antidepression, including the    risk of weight gain and drop in her white blood cell count.  The patient    uanderstands the above information nd wants to start Remeron.  Remeron will    be started at 15 mg p.o. q.h.s. 2. Effexor will be increased for antidepression at 225 mg XR q.a.m. 3. Pendalol will be increased to 15 mg a day and will be spread out in t.i.d.    dosing at 5 mg t.i.d. p.o. for augmenting the Effexor. 4. Serzone will be tapered off. 5. Zyprexa will be continued at 10 mg q.h.s. as her second mood stabilizer. 6. Topamax will be continued at 100 mg q.h.s.  for anti-headahe and as her    primary mood stabilizer. 7. We will consider whether we need to restart Provigil at 200 mg q.a.m. 8. Milieu and group psychotherapy.   TENTATIVE LENGTH OF STAY AND DISCHARGE PLANS:  Five days.  Discharge plans include outpatient psychotherapy and psychotherapeutic mediation management. DD:  05/19/00 TD:  05/20/00 Job: [id number]

## 2010-08-31 NOTE — H&P (Signed)
[facility]  Patient:    Norma [name], Norma [name]                      MRN: [id number] Adm. Date:  [id number] Attending:  Tresa Garter CC:         Adelene Amas. Williford, M.D.   History and Physical  DATE OF BIRTH:  10-25-1950  CHIEF COMPLAINT:  Shortness of breath.  HISTORY OF PRESENT ILLNESS:  The patient is a 60 year old white female who was brought to the emergency room from [facility] with increasing shortness of breath and chest x-ray which revealed today bilateral infiltrates. She took an overdose of Darvocet and ______ medications about a week ago and was admitted with aspiration pneumonia to [facility] where she was treated with IV Unasyn. Later she was transferred to [facility] on oral Augmentin. Her condition has gotten worse.  PAST MEDICAL HISTORY:  Depression, hyperlipidemia, hypothyroidism, GERD, morbid obesity.  MEDICATIONS:  Augmentin 875 mg b.i.d., Zyprexa 10 mg q.h.s., Topamax 100 mg q.h.s., Levothroid 0.2 mg daily, Vioxx 50 mg daily, Zocor 20 mg daily, protonix 40 mg daily, remeron 15 mg q.h.s., ______ 5 mg t.i.d., effexor 225 mg daily, Ativan 1 mg q. 4h p.r.n.  ALLERGIES:  HYDROCODONE and IMITREX.  SOCIAL HISTORY:  She is a nonsmoker. She lives with her nephew and his two children.  FAMILY HISTORY:  Positive for hypertension, negative for cancer or diabetes.  REVIEW OF SYSTEMS:  Indigestion, cough, shortness of breath, weight gain, depression. The rest is as above or negative.  PHYSICAL EXAMINATION:  VITAL SIGNS:  Blood pressure 92/49, respirations 22, heart rate 67, temperature 97.8.  GENERAL:  She is in no acute distress. She is overweight.  HEENT:  Moist mucosa.  NECK:  Short, supple.  LUNGS:  Bilateral crackles.  HEART:  S1, S2, slight tachycardia.  ABDOMEN:  Obese, soft, nontender. No organomegaly, no masses felt.  EXTREMITIES:  Lower extremities without edema. Calves nontender.  SKIN:   Without changes or bruises.  NEUROLOGIC:  She is alert, oriented, and cooperative. Cranial nerves II-XII nonfocal. DTRs, muscle strength within normal limits.  LABORATORY DATA:  Chest x-ray with bilateral infiltrates. Other labs are pending.  ASSESSMENT/PLAN: 1. Bilateral infiltrates, possible pneumonia versus ______ disease, obtain    chest x-ray, DPD, sputum to be cultured and stained. Obtain HIV test,    may need to obtain pulmonary consult. 2. Hypothyroidism. ______ therapy. Check TSH. 3. Depression with recent suicidal attempt. I will consult Dr. Jeanie Sewer    tomorrow. In the meantime, continue current therapy. 4. Gastroesophageal reflux disease. Continue current therapy. DD:  05/20/00 TD:  05/21/00 Job: [id number]

## 2010-08-31 NOTE — Procedures (Signed)
[facility]  Patient:    Norma [name], Norma [name] Visit Number: [id number] MRN: [id number]          Service Type: Attending:  [place] Bing, M.D. Dictated by:   [place] Bing, M.D. Proc. Date: 08/12/01                              Echocardiograms  REFERRING PHYSICIAN:  Dr. Juanetta Gosling and Dr. Daleen Squibb  CLINICAL DATA:  A 60 year old woman with chest pain and dyspnea.  1. Progressive dobutamine infusion to a peak dose of 50 mcg/kg per minute.    Low level leg exercise utilized as well as 2 mg of intravenous atropine.    Despite this, the peak heart rate achieved was 140, 84% of age predicted    maximum. 2. Blood pressure increased from a resting value of 130/60 to a peak value of    160/65. 3. No important arrhythmias, PVCs present throughout. 4. Baseline EKG:  Normal sinus rhythm, left bundle branch block.  EKG during    dobutamine:  No significant change due to the presence of LVDB.  Baseline    echocardiogram:  Technically difficult.  Intravenous contrast was used to    assist in adequate LV imaging.  LV size and baseline function were normal    with abnormal septal motion related to the left bundle branch block. 5. Echocardiogram during pharmacologic stress:  There was no change in septal    motion.  Other segments became hyperdynamic with stress.  IMPRESSION:  Probably negative stress echocardiogram with a normal response to pharmacologic stress from all myocardial segments except perhaps the septum whose baseline motion is abnormal due to the presence of an IVCD.  No convincing evidence for myocardial ischemia or infarction.  Other findings as noted. Dictated by:   [place] Bing, M.D. Attending:  [place] Bing, M.D. DD:  08/13/01 TD:  08/16/01 Job: [id number]

## 2010-08-31 NOTE — Discharge Summary (Signed)
NAMERMANI, KAPUSTA               ACCOUNT NO.:  [id number]   MEDICAL RECORD NO.:  [id number]          PATIENT TYPE:  INP   LOCATION:  3729                         FACILITY:  [facility]   PHYSICIAN:  Cecil Cranker, M.D.DATE OF BIRTH:  October 02, 1950   DATE OF ADMISSION:  06/12/2005  DATE OF DISCHARGE:  06/13/2005                                 DISCHARGE SUMMARY   PROCEDURES:  None.   PRIMARY DIAGNOSIS:  Chest pain, cardiac enzymes negative for myocardial  infarction and outpatient Myoview recommended.   SECONDARY DIAGNOSES:  1.  Bipolar disorder.  2.  Arthritis.  3.  Chronic obstructive pulmonary disease.  4.  Fibromyalgia.  5.  Obstructive sleep apnea, on continuous positive airway pressure.  6.  Irritable bowel syndrome.  7.  History of left bundle branch block.  8.  Gastroesophageal reflux disease.  9.  Migraines.  10. Peripheral neuropathy.  11. Chronic lower back pain.  12. Morbid obesity.  13. Dyslipidemia.  14. Diabetes.  15. Hypothyroidism.   PROCEDURES:  None.   TIME OF DISCHARGE PLANNING:  Twenty-two minutes.   HOSPITAL COURSE:  Ms. Curenton is a 60 year old female with no known history of  coronary artery disease.  She had onset of substernal chest pain at 10 a.m.  while lying in bed.  She has a history of reflux disease and fibromyalgia,  but this was a different kind of pain.  It radiated into her jaw.  It was  sharp.  She received nitroglycerin and the pain improved.  She was admitted  for further evaluation and treatment.   She was evaluated by Dr. Dietrich Pates and he felt that she had atypical chest  pain with cardiac risk factors, but a benign exam.  He felt that if her  cardiac markers were negative and her symptoms resolved, she could have an  outpatient adenosine Myoview.   The next day, her cardiac enzymes were negative.  She was evaluated by Dr.  Glennon Hamilton and it was felt that she did not need further inpatient workup.  He recommended adding Norvasc to  her medication regimen and an outpatient  Myoview.  She was considered stable for discharge on June 13, 2005 with  outpatient followup arranged.   DISCHARGE INSTRUCTIONS:  1.  Her activity level is to be increased slowly.  2.  She is to stick to a low-fat diabetic diet.  3.  She is to follow up with Dr. Juanetta Gosling and call for an appointment.  4.  She is to have an appointment with the PA for Dr. Daleen Squibb in our [place]      office and the office will contact her with the date and time.  5.  She is to have an adenosine Myoview that will be a 2-day protocol and      this will be arranged by the [facility] office as well.   DISCHARGE MEDICATIONS:  1.  Norvasc 5 mg a day.  2.  Avandamet 2/500 mg nightly.  3.  Effexor 300 mg nightly.  4.  Lamictal 150 mg nightly.  5.  Protonix 40 mg twice daily.  6.  Synthroid 100 mcg daily.  7.  Reglan 20 mg q.a.m.  8.  Ibuprofen 800 mg 3 times daily p.r.n. and Excedrin Migraine one to two      tabs p.o. p.r.n.  9.  Crestor 40 mg nightly.      Theodore Demark, P.A. [facility]    ______________________________  E. Graceann Congress, M.D.    RB/MEDQ  D:  06/13/2005  T:  06/14/2005  Job:  [id number]   cc:   Ramon Dredge L. Juanetta Gosling, M.D.  Fax: [phone]   Jesse Sans. Wall, M.D.  [address]  [address]  [place]  [place] [address]

## 2010-09-17 ENCOUNTER — Ambulatory Visit (INDEPENDENT_AMBULATORY_CARE_PROVIDER_SITE_OTHER): Payer: Medicare HMO | Admitting: Internal Medicine

## 2010-09-17 DIAGNOSIS — K219 Gastro-esophageal reflux disease without esophagitis: Secondary | ICD-10-CM

## 2010-09-17 DIAGNOSIS — R109 Unspecified abdominal pain: Secondary | ICD-10-CM

## 2010-09-17 DIAGNOSIS — Z862 Personal history of diseases of the blood and blood-forming organs and certain disorders involving the immune mechanism: Secondary | ICD-10-CM

## 2011-02-04 ENCOUNTER — Ambulatory Visit (INDEPENDENT_AMBULATORY_CARE_PROVIDER_SITE_OTHER): Payer: Medicare HMO | Admitting: Internal Medicine

## 2011-02-04 ENCOUNTER — Telehealth (INDEPENDENT_AMBULATORY_CARE_PROVIDER_SITE_OTHER): Payer: Self-pay | Admitting: *Deleted

## 2011-02-04 ENCOUNTER — Encounter (INDEPENDENT_AMBULATORY_CARE_PROVIDER_SITE_OTHER): Payer: Self-pay | Admitting: Internal Medicine

## 2011-02-04 VITALS — BP 120/70 | HR 72 | Temp 97.6°F | Resp 12 | Ht 64.0 in | Wt 290.0 lb

## 2011-02-04 DIAGNOSIS — B3789 Other sites of candidiasis: Secondary | ICD-10-CM

## 2011-02-04 MED ORDER — NYSTATIN-TRIAMCINOLONE 100000-0.1 UNIT/GM-% EX OINT: TOPICAL_OINTMENT | Freq: Two times a day (BID) | CUTANEOUS | Status: DC

## 2011-02-04 NOTE — Patient Instructions (Signed)
Office will schedule Abdomino-pelvis CT tomorrow.

## 2011-02-04 NOTE — Telephone Encounter (Signed)
Patient states she is having abdominal Pain.  Dr. Karilyn Cota had said if she had this and he was in he would see.  Please advise if she should be added or do we need to do tests?

## 2011-02-05 ENCOUNTER — Encounter (HOSPITAL_COMMUNITY): Payer: Self-pay

## 2011-02-05 ENCOUNTER — Other Ambulatory Visit (INDEPENDENT_AMBULATORY_CARE_PROVIDER_SITE_OTHER): Payer: Self-pay | Admitting: Internal Medicine

## 2011-02-05 ENCOUNTER — Ambulatory Visit (HOSPITAL_COMMUNITY)
Admission: RE | Admit: 2011-02-05 | Discharge: 2011-02-05 | Disposition: A | Discharge status: Home / Self Care | Payer: Medicare HMO | Source: Ambulatory Visit | Attending: Internal Medicine | Admitting: Internal Medicine

## 2011-02-05 DIAGNOSIS — R52 Pain, unspecified: Secondary | ICD-10-CM

## 2011-02-05 DIAGNOSIS — Z9889 Other specified postprocedural states: Secondary | ICD-10-CM | POA: Insufficient documentation

## 2011-02-05 DIAGNOSIS — R109 Unspecified abdominal pain: Secondary | ICD-10-CM | POA: Insufficient documentation

## 2011-02-05 DIAGNOSIS — E119 Type 2 diabetes mellitus without complications: Secondary | ICD-10-CM | POA: Insufficient documentation

## 2011-02-05 DIAGNOSIS — I10 Essential (primary) hypertension: Secondary | ICD-10-CM | POA: Insufficient documentation

## 2011-02-05 DIAGNOSIS — E785 Hyperlipidemia, unspecified: Secondary | ICD-10-CM | POA: Insufficient documentation

## 2011-02-05 HISTORY — DX: Essential (primary) hypertension: I10

## 2011-02-05 LAB — CREATININE, SERUM
Creatinine, Ser: 1.1 mg/dL (ref 0.50–1.10)
GFR calc Af Amer: 63 mL/min — ABNORMAL LOW (ref >90)

## 2011-02-05 MED ORDER — IOHEXOL 300 MG/ML  SOLN
100.0000 mL | Freq: Once | INTRAMUSCULAR | Status: AC | PRN
  Administered 2011-02-05: 100 mL via INTRAVENOUS

## 2011-02-05 NOTE — Progress Notes (Signed)
Presenting complaint; abdominal pain of 4 days' duration. Subjective; patient is 60 year old Caucasian female who called this morning and requested to be seen because of 4 day history of unrelenting mid abdominal pain. She's been having this pain off and on for the past couple of years and 2 CTs in the past have been negative last one was in December 2011. She had an episode of pain in July lasting for few days. She stopped Pravachol and got better and felt she had found the cause of this pain. Her pain started 4 days ago and has been constant. It is located in the peri- umbilicus region. She gets little relief from pain medication. She has not experienced nausea or vomiting fever or chills diarrhea melena or rectal bleeding or dysuria or hematuria. She uses Excedrin 4-5 days each week for headache. She has lost 12 pounds in the last 4 months which is a voluntary weight loss. Current medications. Current Outpatient Prescriptions  Medication Sig Dispense Refill  . Aspirin-Acetaminophen-Caffeine (EXCEDRIN MIGRAINE PO) Take by mouth. 1-2 tablets daily as needed       . buPROPion (WELLBUTRIN XL) 150 MG 24 hr tablet Take 150 mg by mouth daily.        . ferrous sulfate 325 (65 FE) MG tablet Take 325 mg by mouth. Take 1 by mouth 3-4 times a week       . glyBURIDE (DIABETA) 2.5 MG tablet Take 2.5 mg by mouth daily with breakfast.        . HYDROcodone-acetaminophen (VICODIN) 5-500 MG per tablet Take 1 tablet by mouth daily.        [name] lamoTRIgine (LAMICTAL) 150 MG tablet Take 150 mg by mouth daily.        [name] levothyroxine (SYNTHROID, LEVOTHROID) 100 MCG tablet Take 100 mcg by mouth daily.        [name] lisinopril (PRINIVIL,ZESTRIL) 10 MG tablet Take 10 mg by mouth daily.        . metFORMIN (GLUCOPHAGE) 500 MG tablet Take 500 mg by mouth daily.        [name] omeprazole (PRILOSEC) 20 MG capsule Take 20 mg by mouth daily.        [name] venlafaxine (EFFEXOR) 100 MG tablet Take 150 mg by mouth 2 (two) times daily.        [name]  nystatin-triamcinolone (MYCOLOG) ointment Apply topically 2 (two) times daily.  60 g  1   objective BP 120/70  Pulse 72  Temp(Src) 97.6 F (36.4 C) (Oral)  Resp 12  Ht 5\' 4"  (1.626 m)  Wt 290 lb (131.543 kg)  BMI 49.78 kg/m2 Conjunctiva is pink. Sclerae nonicteric. Oropharyngeal mucosa is normal No neck masses or thyromegaly noted. Abdomen is protuberant; bowel sounds are normal no bruit is noted. She has erythema to skin at the umbilicus and periumbilical region but no discharge noted. Abdomen is soft. She has week fullness and moderate tenderness in the umbilicus region primarily above the level of the umbilicus. No definite mass palpated. No organomegaly or masses. She also has  rash in both groins No peripheral edema. She has lipoma at left lateral malleolus. Assessment Recurrent mid abdominal pain not associated with any other symptom. 2 prior studies have failed to show any mass or other abnormality. I am concerned that she may have a small hernia or panniculitis. It may be worthwhile to repeat her CT while she is having acute symptoms. He has a fungal skin infection in periumbilical and inguinal  areas Obesity. I've asked her  to discuss with Dr. Juanetta Gosling if she should consider bariatric surgery. Plan Abdominopelvic CT with IV contrast as soon as possible. Mycolog-II cream or ointment to be applied to areas with skin rash twice daily until rash is resolved. She also needs to keep these areas dry.

## 2011-02-06 NOTE — Telephone Encounter (Signed)
Patient was seen in the office on 02-04-11 by Dr. Karilyn Cota.

## 2011-05-27 ENCOUNTER — Other Ambulatory Visit (HOSPITAL_COMMUNITY): Payer: Self-pay | Admitting: Orthopedic Surgery

## 2011-05-27 DIAGNOSIS — M25561 Pain in right knee: Secondary | ICD-10-CM

## 2011-06-03 ENCOUNTER — Ambulatory Visit (HOSPITAL_COMMUNITY): Payer: 59

## 2011-06-14 ENCOUNTER — Other Ambulatory Visit (HOSPITAL_COMMUNITY): Payer: Self-pay | Admitting: Pulmonary Disease

## 2011-06-14 DIAGNOSIS — Z139 Encounter for screening, unspecified: Secondary | ICD-10-CM

## 2011-06-14 HISTORY — PX: ARTHROSCOPY KNEE W/ DRILLING: SUR92

## 2011-06-21 ENCOUNTER — Ambulatory Visit (HOSPITAL_COMMUNITY): Payer: 59

## 2011-06-28 ENCOUNTER — Ambulatory Visit (HOSPITAL_COMMUNITY): Payer: 59

## 2011-07-04 ENCOUNTER — Ambulatory Visit (HOSPITAL_COMMUNITY): Payer: 59

## 2011-07-05 ENCOUNTER — Ambulatory Visit (HOSPITAL_COMMUNITY): Payer: 59

## 2011-07-08 ENCOUNTER — Ambulatory Visit (HOSPITAL_COMMUNITY)
Admission: RE | Admit: 2011-07-08 | Discharge: 2011-07-08 | Disposition: A | Discharge status: Home / Self Care | Payer: Medicare Other | Source: Ambulatory Visit | Attending: Pulmonary Disease | Admitting: Pulmonary Disease

## 2011-07-08 DIAGNOSIS — Z139 Encounter for screening, unspecified: Secondary | ICD-10-CM

## 2011-07-08 DIAGNOSIS — Z1231 Encounter for screening mammogram for malignant neoplasm of breast: Secondary | ICD-10-CM | POA: Insufficient documentation

## 2011-09-20 ENCOUNTER — Other Ambulatory Visit: Payer: Self-pay

## 2011-09-20 DIAGNOSIS — J449 Chronic obstructive pulmonary disease, unspecified: Secondary | ICD-10-CM

## 2011-09-27 ENCOUNTER — Ambulatory Visit (HOSPITAL_COMMUNITY): Payer: 59

## 2011-10-11 ENCOUNTER — Ambulatory Visit (HOSPITAL_COMMUNITY)
Admission: RE | Admit: 2011-10-11 | Discharge: 2011-10-11 | Disposition: A | Discharge status: Home / Self Care | Payer: Medicare Other | Source: Ambulatory Visit | Attending: Pulmonary Disease | Admitting: Pulmonary Disease

## 2011-10-11 ENCOUNTER — Other Ambulatory Visit (HOSPITAL_COMMUNITY): Payer: Self-pay | Admitting: Pulmonary Disease

## 2011-10-11 DIAGNOSIS — J449 Chronic obstructive pulmonary disease, unspecified: Secondary | ICD-10-CM | POA: Insufficient documentation

## 2011-10-11 DIAGNOSIS — R0989 Other specified symptoms and signs involving the circulatory and respiratory systems: Secondary | ICD-10-CM | POA: Insufficient documentation

## 2011-10-11 DIAGNOSIS — J4489 Other specified chronic obstructive pulmonary disease: Secondary | ICD-10-CM | POA: Insufficient documentation

## 2011-10-11 DIAGNOSIS — R0609 Other forms of dyspnea: Secondary | ICD-10-CM | POA: Insufficient documentation

## 2011-10-15 NOTE — Procedures (Signed)
NAMESOLENE, HEREFORD               ACCOUNT NO.:  [id number]  MEDICAL RECORD NO.:  [id number]  LOCATION:                                 FACILITY:  PHYSICIAN:  Zymeir Salminen L. Juanetta Gosling, M.D.DATE OF BIRTH:  Oct 15, 1950  DATE OF PROCEDURE:  10/14/2011 DATE OF DISCHARGE:                           PULMONARY FUNCTION TEST   Reason for pulmonary function testing is COPD. 1. Spirometry shows no ventilatory defect and no evidence of airflow     obstruction. 2. Lung volumes are normal. 3. DLCO is normal. 4. Airway resistance is low. 5. Although she has previously been diagnosed with COPD, current     pulmonary function testing is essentially normal.     Eugene Zeiders L. Juanetta Gosling, M.D.     ELH/MEDQ  D:  10/14/2011  T:  10/14/2011  Job:  [id number]

## 2011-10-16 LAB — PULMONARY FUNCTION TEST

## 2011-10-18 ENCOUNTER — Telehealth (INDEPENDENT_AMBULATORY_CARE_PROVIDER_SITE_OTHER): Payer: Self-pay | Admitting: *Deleted

## 2011-10-18 NOTE — Telephone Encounter (Signed)
Norma [name] has called office requesting that we send records to [facility] in [facility] as a referral. Patient has had abdominal pain 4 years and has decides to followup with him to rule out hernia. Per Dr.Rehman may release patient's to this Surgeon

## 2011-10-21 NOTE — Telephone Encounter (Signed)
appt w/ Dr Doylene Canning for 10/24/11 @ 930, patient aware, take ins card, med list & copay

## 2011-10-30 ENCOUNTER — Other Ambulatory Visit (INDEPENDENT_AMBULATORY_CARE_PROVIDER_SITE_OTHER): Payer: Self-pay | Admitting: Internal Medicine

## 2011-10-30 NOTE — Telephone Encounter (Signed)
Needs OV.  

## 2011-10-30 NOTE — Telephone Encounter (Signed)
Makayela would like to know if Dr. Karilyn Cota would like for her to continue taking Regland? She is out of refills and will need a new prescription sent in for Regland or a new medication.  She use [facility] in [place]. Patient's return phone number is [phone].

## 2011-11-04 NOTE — Telephone Encounter (Signed)
Apt has been scheduled for 02/03/12 at 2:00 pm with Dr. Karilyn Cota

## 2011-12-09 ENCOUNTER — Other Ambulatory Visit (INDEPENDENT_AMBULATORY_CARE_PROVIDER_SITE_OTHER): Payer: Self-pay | Admitting: Internal Medicine

## 2012-02-03 ENCOUNTER — Ambulatory Visit (INDEPENDENT_AMBULATORY_CARE_PROVIDER_SITE_OTHER): Payer: Medicare Other | Admitting: Internal Medicine

## 2012-02-03 ENCOUNTER — Encounter (INDEPENDENT_AMBULATORY_CARE_PROVIDER_SITE_OTHER): Payer: Self-pay | Admitting: Internal Medicine

## 2012-02-03 VITALS — BP 118/74 | HR 72 | Temp 98.6°F | Resp 20 | Ht 64.0 in | Wt 297.1 lb

## 2012-02-03 DIAGNOSIS — R109 Unspecified abdominal pain: Secondary | ICD-10-CM

## 2012-02-03 DIAGNOSIS — R251 Tremor, unspecified: Secondary | ICD-10-CM

## 2012-02-03 DIAGNOSIS — R259 Unspecified abnormal involuntary movements: Secondary | ICD-10-CM

## 2012-02-03 MED ORDER — METOCLOPRAMIDE HCL 10 MG PO TABS: 10.0000 mg | ORAL_TABLET | Freq: Every day | ORAL | Status: DC

## 2012-02-03 NOTE — Progress Notes (Signed)
Presenting complaint;  Recurrent abdominal pain.  Subjective:  Patient is 61 year old Caucasian female who presents with recurrent. Likely pain. She was last seen one year ago. She says she is having pain every month. She has not been able to figure out any pattern or triggers. Last month she had 4 days of moderate to severe pain. She has some nausea but no vomiting or fever. She also denies dysuria or diarrhea. On few occasions she has noted pain radiating to hypogastric region. She has noted mild pain over the last 2 days. She has had tremor of right hand. She is seen by neurologist and her tremor felt not to be due to an Reglan. She had lab studies last month and says everything was normal or near normal.  Current Medications: Current Outpatient Prescriptions  Medication Sig Dispense Refill  . Aspirin-Acetaminophen-Caffeine (EXCEDRIN MIGRAINE PO) Take by mouth. 1-2 tablets daily as needed       . buPROPion (WELLBUTRIN XL) 150 MG 24 hr tablet Take 150 mg by mouth daily.       . ferrous sulfate 325 (65 FE) MG tablet Take 325 mg by mouth. Take 1 by mouth 3-4 times a week       . glyBURIDE (DIABETA) 2.5 MG tablet Take 5 mg by mouth daily with breakfast.       . HYDROcodone-acetaminophen (VICODIN) 5-500 MG per tablet Take 1 tablet by mouth daily. Patient states that she is taking 7.5/325 mg Takes 1 by mouth as needed      . lamoTRIgine (LAMICTAL) 150 MG tablet Take 150 mg by mouth daily.        [name] levothyroxine (SYNTHROID, LEVOTHROID) 100 MCG tablet Take 100 mcg by mouth daily.        [name] lisinopril (PRINIVIL,ZESTRIL) 10 MG tablet Take 10 mg by mouth daily.        . metFORMIN (GLUCOPHAGE) 500 MG tablet Take 500 mg by mouth 2 (two) times daily.       . metoCLOPramide (REGLAN) 10 MG tablet TAKE 2 TABLETS EVERY MORNING AND 2 TABLETS EVERY EVENING AS NEEDED FOR NAUSEA AND VOMITING  120 tablet  1  . nystatin-triamcinolone (MYCOLOG) ointment Apply topically 2 (two) times daily.  60 g  1  . omeprazole  (PRILOSEC) 20 MG capsule Take 20 mg by mouth daily.        [name] topiramate (TOPAMAX) 25 MG capsule Take 25 mg by mouth.      . venlafaxine (EFFEXOR) 100 MG tablet Take 150 mg by mouth 2 (two) times daily.           Objective: Blood pressure 118/74, pulse 72, temperature 98.6 F (37 C), temperature source Oral, resp. rate 20, height 5\' 4"  (1.626 m), weight 297 lb 1.6 oz (134.764 kg). Patient is alert and in no acute distress. Conjunctiva is pink. Sclera is nonicteric Oropharyngeal mucosa is normal. No neck masses or thyromegaly noted. Cardiac exam with regular rhythm normal S1 and S2. No murmur or gallop noted. Lungs are clear to auscultation. Abdomen is obese with erythema at umbilicus and across lower abdominal crease. No abdominal bruit noted. Abdomen is soft and nontender without organomegaly or masses.  No LE edema or clubbing noted. She has tremor to right hand.    Assessment:  Recurrent mid abdominal pain with extensive evaluation. She has been evaluated at [facility] and thought to have IBS her symptoms are not typical. She also has undergone gynecologic evaluation as well as opinion by Dr. Mikal Plane who felt  laparoscopy would have low yield and possibly take things worse for her. Last abdominopelvic CT was one year ago and was unremarkable. If pain recurs will try to arrange MR abdomen was she symptomatic. Tremor right hand possibly related to metoclopramide.   Plan:  Patient will call when she has next episode of abdominal pain in which case we'll consider MR abdomen with contrast. Patient advised to decrease metoclopramide 10 mg by mouth each bedtime Office visit in 6 months.

## 2012-02-03 NOTE — Patient Instructions (Signed)
Please keep symptom diary as discussed. MR abdomen would be performed with next episode of moderate to severe pain.

## 2012-02-05 ENCOUNTER — Other Ambulatory Visit (INDEPENDENT_AMBULATORY_CARE_PROVIDER_SITE_OTHER): Payer: Self-pay | Admitting: Internal Medicine

## 2012-02-16 DIAGNOSIS — R109 Unspecified abdominal pain: Secondary | ICD-10-CM | POA: Insufficient documentation

## 2012-02-16 DIAGNOSIS — E039 Hypothyroidism, unspecified: Secondary | ICD-10-CM | POA: Insufficient documentation

## 2012-02-16 DIAGNOSIS — K219 Gastro-esophageal reflux disease without esophagitis: Secondary | ICD-10-CM | POA: Insufficient documentation

## 2012-02-16 DIAGNOSIS — R251 Tremor, unspecified: Secondary | ICD-10-CM | POA: Insufficient documentation

## 2012-03-19 DIAGNOSIS — R519 Headache, unspecified: Secondary | ICD-10-CM | POA: Insufficient documentation

## 2012-03-19 DIAGNOSIS — R51 Headache: Secondary | ICD-10-CM | POA: Insufficient documentation

## 2012-05-14 DIAGNOSIS — J449 Chronic obstructive pulmonary disease, unspecified: Secondary | ICD-10-CM

## 2012-05-14 DIAGNOSIS — K3184 Gastroparesis: Secondary | ICD-10-CM

## 2012-05-14 DIAGNOSIS — G478 Other sleep disorders: Secondary | ICD-10-CM

## 2012-05-14 DIAGNOSIS — D72829 Elevated white blood cell count, unspecified: Secondary | ICD-10-CM

## 2012-05-19 ENCOUNTER — Encounter (INDEPENDENT_AMBULATORY_CARE_PROVIDER_SITE_OTHER): Payer: Self-pay

## 2012-06-05 DIAGNOSIS — E119 Type 2 diabetes mellitus without complications: Secondary | ICD-10-CM

## 2012-06-05 DIAGNOSIS — I10 Essential (primary) hypertension: Secondary | ICD-10-CM

## 2012-06-05 DIAGNOSIS — D72829 Elevated white blood cell count, unspecified: Secondary | ICD-10-CM

## 2012-07-17 ENCOUNTER — Encounter (INDEPENDENT_AMBULATORY_CARE_PROVIDER_SITE_OTHER): Payer: Self-pay | Admitting: *Deleted

## 2012-07-17 DIAGNOSIS — E119 Type 2 diabetes mellitus without complications: Secondary | ICD-10-CM

## 2012-07-17 DIAGNOSIS — I10 Essential (primary) hypertension: Secondary | ICD-10-CM

## 2012-07-17 DIAGNOSIS — D72829 Elevated white blood cell count, unspecified: Secondary | ICD-10-CM

## 2012-08-03 ENCOUNTER — Ambulatory Visit (INDEPENDENT_AMBULATORY_CARE_PROVIDER_SITE_OTHER): Payer: Medicare Other | Admitting: Internal Medicine

## 2012-09-17 ENCOUNTER — Encounter: Payer: Self-pay | Admitting: Neurology

## 2012-09-17 ENCOUNTER — Ambulatory Visit (INDEPENDENT_AMBULATORY_CARE_PROVIDER_SITE_OTHER): Payer: Medicare Other | Admitting: Neurology

## 2012-09-17 VITALS — BP 137/77 | HR 90 | Ht 63.75 in | Wt 290.0 lb

## 2012-09-17 DIAGNOSIS — R51 Headache: Secondary | ICD-10-CM

## 2012-09-17 DIAGNOSIS — R251 Tremor, unspecified: Secondary | ICD-10-CM

## 2012-09-17 DIAGNOSIS — G2581 Restless legs syndrome: Secondary | ICD-10-CM

## 2012-09-17 DIAGNOSIS — R259 Unspecified abnormal involuntary movements: Secondary | ICD-10-CM

## 2012-09-17 HISTORY — DX: Restless legs syndrome: G25.81

## 2012-09-17 MED ORDER — CLONAZEPAM 0.5 MG PO TABS: 0.5000 mg | ORAL_TABLET | Freq: Two times a day (BID) | ORAL | Status: DC

## 2012-09-17 NOTE — Progress Notes (Signed)
Reason for visit: Tremor  Norma [name] is an 62 y.o. female  History of present illness:  Norma [name] is a 62 year old right-handed white female with a history of morbid obesity, degenerative arthritis, and a benign essential tremor. The patient has had a questionable reaction to the Mysoline with anaphylaxis, but the patient was on amoxicillin at the time of the reaction as well. The patient is now on clonazepam, and she is tolerating this medication fairly well. The patient indicates that it does help her tremor. The patient recently lost her prescription bottle, and she has been off the medication for several days. The patient returns for an evaluation. The patient reports some discomfort in the legs when she sits up for a certain length of time, or when she goes to bed at night. The patient notes that Ultram will help this discomfort when she takes it. The patient returns for an evaluation.  Past Medical History  Diagnosis Date  . Anemia   . GERD (gastroesophageal reflux disease)   . Hypertension   . Diabetes mellitus   . Morbid obesity   . Dyslipidemia   . Chronic daily headache   . Depression   . IBS (irritable bowel syndrome)   . Obstructive sleep apnea on CPAP   . Gastroparesis   . Bipolar disorder   . Hypothyroidism   . Restless legs syndrome (RLS) 09/17/2012  . Degenerative arthritis     Past Surgical History  Procedure Laterality Date  . Cholecystectomy    . Abdominal hysterectomy    . Lesions      Removed from tongue  . Rectal surgery      fissure  . Arthroscopy knee w/ drilling  06/2011    Family History  Problem Relation Age of Onset  . Hypertension Mother   . Arthritis Father   . Alcohol abuse Brother   . Hypertension Sister     Social history:  reports that she quit smoking about 41 years ago. Her smoking use included Cigarettes. She smoked 0.00 packs per day. She has never used smokeless tobacco. She reports that she does not drink alcohol or use  illicit drugs.  Allergies:  Allergies  Allergen Reactions  . Amoxicillin Anaphylaxis  . Hydrocodone Anaphylaxis  . Primidone Anaphylaxis  . Depacon (Valproate Sodium)     Causes falls     Medications:  Current Outpatient Prescriptions on File Prior to Visit  Medication Sig Dispense Refill  . Aspirin-Acetaminophen-Caffeine (EXCEDRIN MIGRAINE PO) Take by mouth. 1-2 tablets daily as needed       . buPROPion (WELLBUTRIN XL) 150 MG 24 hr tablet Take 150 mg by mouth daily.       [name] glyBURIDE (DIABETA) 2.5 MG tablet Take 5 mg by mouth daily with breakfast.       . lamoTRIgine (LAMICTAL) 150 MG tablet Take 150 mg by mouth daily.        [name] levothyroxine (SYNTHROID, LEVOTHROID) 100 MCG tablet Take 100 mcg by mouth daily.        [name] lisinopril (PRINIVIL,ZESTRIL) 10 MG tablet Take 10 mg by mouth daily.        . metoCLOPramide (REGLAN) 10 MG tablet Take 1 tablet (10 mg total) by mouth at bedtime.  30 tablet  5  . nystatin-triamcinolone ointment (MYCOLOG) APPLY TWICE A DAY  60 g  0  . omeprazole (PRILOSEC) 20 MG capsule Take 20 mg by mouth daily.        . promethazine (PHENERGAN) 25 MG tablet  Take 25 mg by mouth every 6 (six) hours as needed. 1/2 tablet twice daily as needed      . venlafaxine (EFFEXOR) 100 MG tablet Take 150 mg by mouth 2 (two) times daily.        . metFORMIN (GLUCOPHAGE) 500 MG tablet Take 500 mg by mouth 2 (two) times daily.        No current facility-administered medications on file prior to visit.    ROS:  Out of a complete 14 system review of symptoms, the patient complains only of the following symptoms, and all other reviewed systems are negative.  Weight loss, fatigue Heart murmur Hearing loss Shortness of breath  Incontinence of bladder Joint pain, muscle cramps, achy muscles Allergies Headache, numbness, weakness, dizziness Depression, drowsiness, decreased energy, disinterest in activities, snoring  Blood pressure 137/77, pulse 90, height 5' 3.75" (1.619 m),  weight 290 lb (131.543 kg).  Physical Exam  General: The patient is alert and cooperative at the time of the examination. The patient is morbidly obese.  Skin: No significant peripheral edema is noted.   Neurologic Exam  Cranial nerves: Facial symmetry is present. Speech is normal, no aphasia or dysarthria is noted. Extraocular movements are full. Visual fields are full.  Motor: The patient has good strength in all 4 extremities.  Coordination: The patient has good finger-nose-finger and heel-to-shin bilaterally.  Gait and station: The patient has a normal gait. Tandem gait is normal. Romberg is negative. No drift is seen.  Reflexes: Deep tendon reflexes are symmetric.   Assessment/Plan:  1. Benign essential tremor  2. Restless leg syndrome  3. Morbid obesity  The patient will be placed back on clonazepam taking 0.5 mg tablets twice daily. The patient will followup in 6 months. The patient likely has a low-grade restless leg syndrome. The clonazepam may help this.  Marlan Palau MD 09/17/2012 7:58 PM  [facility] [address] [place], [place] [address]  Phone [phone] Fax [phone]

## 2012-09-28 ENCOUNTER — Ambulatory Visit (INDEPENDENT_AMBULATORY_CARE_PROVIDER_SITE_OTHER): Payer: Medicare Other | Admitting: Internal Medicine

## 2012-10-07 ENCOUNTER — Encounter (INDEPENDENT_AMBULATORY_CARE_PROVIDER_SITE_OTHER): Payer: Medicare Other | Admitting: Internal Medicine

## 2012-10-07 DIAGNOSIS — D72829 Elevated white blood cell count, unspecified: Secondary | ICD-10-CM

## 2012-10-07 DIAGNOSIS — D473 Essential (hemorrhagic) thrombocythemia: Secondary | ICD-10-CM

## 2012-10-07 DIAGNOSIS — R21 Rash and other nonspecific skin eruption: Secondary | ICD-10-CM

## 2012-10-26 DIAGNOSIS — D473 Essential (hemorrhagic) thrombocythemia: Secondary | ICD-10-CM

## 2012-10-26 DIAGNOSIS — D72829 Elevated white blood cell count, unspecified: Secondary | ICD-10-CM

## 2012-11-04 ENCOUNTER — Ambulatory Visit (INDEPENDENT_AMBULATORY_CARE_PROVIDER_SITE_OTHER): Payer: Medicare Other | Admitting: Internal Medicine

## 2012-11-04 ENCOUNTER — Encounter (INDEPENDENT_AMBULATORY_CARE_PROVIDER_SITE_OTHER): Payer: Self-pay | Admitting: Internal Medicine

## 2012-11-04 VITALS — BP 94/72 | HR 84 | Temp 98.5°F | Ht 63.0 in | Wt 293.7 lb

## 2012-11-04 DIAGNOSIS — R110 Nausea: Secondary | ICD-10-CM

## 2012-11-04 DIAGNOSIS — R112 Nausea with vomiting, unspecified: Secondary | ICD-10-CM | POA: Insufficient documentation

## 2012-11-04 DIAGNOSIS — K3184 Gastroparesis: Secondary | ICD-10-CM | POA: Insufficient documentation

## 2012-11-04 MED ORDER — METOCLOPRAMIDE HCL 5 MG PO TABS: 5.0000 mg | ORAL_TABLET | Freq: Two times a day (BID) | ORAL | Status: DC

## 2012-11-04 NOTE — Progress Notes (Signed)
Subjective:     Patient ID: Norma [name], female   DOB: Aug 23, 1950, 62 y.o.   MRN: [id number]  HPI Last week she woke up vomiting. She vomited x 2 last week. She tells me she is nauseated all the time.  She does tell me today is the best she has felt. She is not nauseated today. No fever.  She has a hx of nausea.  Occurs after 1st meal of the day.  She has a hx of gastroparesis.  She takes Reglan 10mg  at hs.  She also tells me she has had some diarrhea. She had diarrhea x 2 but now has resolved. Symptoms lasted about 24 hrs. She cannot take Citrocel because of the vomiting.  She is eating 2 meals a days. She says she cannot eat more than 2 meals a day.    Review of Systems  See hpi Current Outpatient Prescriptions  Medication Sig Dispense Refill  . buPROPion (WELLBUTRIN XL) 150 MG 24 hr tablet Take 150 mg by mouth daily.       . Cetirizine HCl (ZYRTEC ALLERGY) 10 MG CAPS Take by mouth.      . clonazePAM (KLONOPIN) 0.5 MG tablet Take 1 tablet (0.5 mg total) by mouth 2 (two) times daily.  60 tablet  5  . EPINEPHrine (EPIPEN IJ) Inject as directed. As needed      . glyBURIDE (DIABETA) 2.5 MG tablet Take 5 mg by mouth daily with breakfast.       . lamoTRIgine (LAMICTAL) 150 MG tablet Take 150 mg by mouth daily.        [name] levothyroxine (SYNTHROID, LEVOTHROID) 100 MCG tablet Take 100 mcg by mouth daily.        [name] losartan (COZAAR) 50 MG tablet Take 50 mg by mouth daily.      . metFORMIN (GLUCOPHAGE) 500 MG tablet Take 500 mg by mouth 2 (two) times daily.       [name] nystatin-triamcinolone ointment (MYCOLOG) APPLY TWICE A DAY  60 g  0  . omeprazole (PRILOSEC) 20 MG capsule Take 20 mg by mouth daily.        . predniSONE (DELTASONE) 5 MG tablet Take by mouth daily.      . promethazine (PHENERGAN) 25 MG tablet Take 25 mg by mouth every 6 (six) hours as needed. 1/2 tablet twice daily as needed      . ranitidine (ZANTAC) 150 MG capsule Take 150 mg by mouth 2 (two) times daily.      . traMADol (ULTRAM  ER) 100 MG 24 hr tablet Take 100 mg by mouth daily.      [name] venlafaxine (EFFEXOR) 100 MG tablet Take 150 mg by mouth 2 (two) times daily.        . metoCLOPramide (REGLAN) 5 MG tablet Take 1 tablet (5 mg total) by mouth 2 (two) times daily before a meal.  90 tablet  3   No current facility-administered medications for this visit.   Past Medical History  Diagnosis Date  . Anemia   . GERD (gastroesophageal reflux disease)   . Hypertension   . Diabetes mellitus   . Morbid obesity   . Dyslipidemia   . Chronic daily headache   . Depression   . IBS (irritable bowel syndrome)   . Obstructive sleep apnea on CPAP   . Gastroparesis   . Bipolar disorder   . Hypothyroidism   . Restless legs syndrome (RLS) 09/17/2012  . Degenerative arthritis   . COPD (chronic obstructive pulmonary  disease)    Past Surgical History  Procedure Laterality Date  . Cholecystectomy    . Abdominal hysterectomy    . Lesions      Removed from tongue  . Rectal surgery      fissure  . Arthroscopy knee w/ drilling  06/2011    and Decemer of 2013.  [name] Shoulder surgery      arthroscopy in March of this year   Allergies  Allergen Reactions  . Amoxicillin Anaphylaxis  . Hydrocodone Anaphylaxis  . Primidone Anaphylaxis  . Aspirin   . Depacon (Valproate Sodium)     Causes falls   . Lisinopril          Objective:   Physical Exam  Filed Vitals:   11/04/12 1024  BP: 94/72  Pulse: 84  Temp: 98.5 F (36.9 C)  Height: 5\' 3"  (1.6 m)  Weight: 293 lb 11.2 oz (133.221 kg)   Alert and oriented. Skin warm and dry. Oral mucosa is moist.   . Sclera anicteric, conjunctivae is pink. Thyroid not enlarged. No cervical lymphadenopathy. Lungs clear. Heart regular rate and rhythm.  Abdomen is soft. Bowel sounds are positive. No hepatomegaly. No abdominal masses felt. Morbidly obese. No tenderness.  No edema to lower extremities.  Mass to lateral left ankle.   No tremors to hand or side effects per patient.     Assessment:     Gastroparesis. Presently taking Reglan 10mg  at night. Nausea: chronic. Worse especially at night.     Plan:    Reglan 5mg  BID. OV in 2 months. PR next week.

## 2012-11-04 NOTE — Patient Instructions (Addendum)
Reglan 5mg  BID. OV 3 months. PR next week

## 2012-11-26 ENCOUNTER — Encounter: Payer: Self-pay | Admitting: Physical Medicine & Rehabilitation

## 2012-12-25 ENCOUNTER — Encounter: Payer: Medicare Other | Attending: Physical Medicine & Rehabilitation

## 2012-12-25 ENCOUNTER — Encounter: Payer: Self-pay | Admitting: Physical Medicine & Rehabilitation

## 2012-12-25 ENCOUNTER — Other Ambulatory Visit: Payer: Self-pay

## 2012-12-25 ENCOUNTER — Ambulatory Visit (HOSPITAL_BASED_OUTPATIENT_CLINIC_OR_DEPARTMENT_OTHER): Payer: Medicare Other | Admitting: Physical Medicine & Rehabilitation

## 2012-12-25 VITALS — BP 182/90 | HR 107 | Resp 16 | Ht 63.0 in | Wt 294.0 lb

## 2012-12-25 DIAGNOSIS — M549 Dorsalgia, unspecified: Secondary | ICD-10-CM | POA: Insufficient documentation

## 2012-12-25 DIAGNOSIS — M47817 Spondylosis without myelopathy or radiculopathy, lumbosacral region: Secondary | ICD-10-CM | POA: Insufficient documentation

## 2012-12-25 DIAGNOSIS — Z79899 Other long term (current) drug therapy: Secondary | ICD-10-CM

## 2012-12-25 DIAGNOSIS — M1710 Unilateral primary osteoarthritis, unspecified knee: Secondary | ICD-10-CM

## 2012-12-25 DIAGNOSIS — G8929 Other chronic pain: Secondary | ICD-10-CM

## 2012-12-25 DIAGNOSIS — G5690 Unspecified mononeuropathy of unspecified upper limb: Secondary | ICD-10-CM

## 2012-12-25 DIAGNOSIS — M1711 Unilateral primary osteoarthritis, right knee: Secondary | ICD-10-CM

## 2012-12-25 DIAGNOSIS — M25519 Pain in unspecified shoulder: Secondary | ICD-10-CM | POA: Insufficient documentation

## 2012-12-25 DIAGNOSIS — Z5181 Encounter for therapeutic drug level monitoring: Secondary | ICD-10-CM

## 2012-12-25 DIAGNOSIS — M792 Neuralgia and neuritis, unspecified: Secondary | ICD-10-CM

## 2012-12-25 MED ORDER — TRAMADOL HCL 50 MG PO TABS: 100.0000 mg | ORAL_TABLET | Freq: Three times a day (TID) | ORAL | Status: DC | PRN

## 2012-12-25 NOTE — Progress Notes (Signed)
Subjective:    Patient ID: Norma [name], female    DOB: November 28, 1950, 62 y.o.   MRN: [id number] CC:  Right knee pain for 2 years HPI Gradual onset right knee pain. Was evaluated by orthopedic surgery. Diagnosed with toward meniscus as well as bone spur. Has benefited from both Synvisc injections as well as cortisone injections. Last cortisone injection in the right knee was in 2013. Last cortisone injection for the left knee was at the beginning of this year.  Also has back pain. Numbness and tingling in the feet when she stands. Has tried epidural steroid injection x3 for the back pain thus far. This was not helpful Asks about various treatment options  Also has complaints of pain in the left shoulder. Was seen by orthopedics. They do not feel this was a shoulder problem but more likely related to pinched nerve in the neck. Was referred for EMG study however has not been able to get an appointment for this. Pain when she sleeps on the left shoulder at night.  Tramadol 2 tablets during the day and at bedtime helps  in combination with tylenol 1000mg  .  This combination takes away 1/2 of pain during the day and all of the pain at night  Had a severe and a prophylactic reaction earlier this year which required hospitalization. She has allergies listed as amoxicillin, hydrocodone, primidone, and aspirin Pain Inventory Average Pain 7 Pain Right Now 2 My pain is sharp and aching  In the last 24 hours, has pain interfered with the following? General activity 7 Relation with others 2 Enjoyment of life 7 What TIME of day is your pain at its worst? evening Sleep (in general) Fair  Pain is worse with: na Pain improves with: na Relief from Meds: na  Mobility walk without assistance how many minutes can you walk? 5-10 ability to climb steps?  no do you drive?  yes transfers alone Do you have any goals in this area?  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  yes  Neuro/Psych bowel control problems weakness tremor trouble walking dizziness depression  Prior Studies Any changes since last visit?  no  Physicians involved in your care Any changes since last visit?  no   Family History  Problem Relation Age of Onset  . Hypertension Mother   . Arthritis Father   . Hypertension Sister    History   Social History  . Marital Status: Single    Spouse Name: N/A    Number of Children: 0  . Years of Education: 16   Occupational History  .     Social History Main Topics  . Smoking status: Former Smoker    Types: Cigarettes    Quit date: 02/04/1971  . Smokeless tobacco: Never Used     Comment: smoked 2 cigarettes a day  . Alcohol Use: No  . Drug Use: No  . Sexual Activity: None   Other Topics Concern  . None   Social History Narrative  . None   Past Surgical History  Procedure Laterality Date  . Cholecystectomy    . Abdominal hysterectomy    . Lesions      Removed from tongue  . Rectal surgery      fissure  . Arthroscopy knee w/ drilling  06/2011  and Decemer of 2013.  [name] Shoulder surgery      arthroscopy in March of this year   Past Medical History  Diagnosis Date  . Anemia   . GERD (gastroesophageal reflux disease)   . Hypertension   . Diabetes mellitus   . Morbid obesity   . Dyslipidemia   . Chronic daily headache   . Depression   . IBS (irritable bowel syndrome)   . Obstructive sleep apnea on CPAP   . Gastroparesis   . Bipolar disorder   . Hypothyroidism   . Restless legs syndrome (RLS) 09/17/2012  . Degenerative arthritis   . COPD (chronic obstructive pulmonary disease)    BP 182/90  Pulse 107  Resp 16  Ht 5\' 3"  (1.6 m)  Wt 294 lb (133.358 kg)  BMI 52.09 kg/m2  SpO2 94%     Review of Systems  Constitutional: Positive for diaphoresis.   Respiratory: Positive for apnea and shortness of breath.   Genitourinary:       Bowel control problems   Musculoskeletal: Positive for gait problem.  Neurological: Positive for dizziness, tremors and weakness.  Psychiatric/Behavioral: Positive for dysphoric mood.  All other systems reviewed and are negative.       Objective:   Physical Exam  Neurological: A sensory deficit is present.  Sensation reduced to pinprick in the left C5, C6, C8 dermatomal distribution  Sensation reduced in a stocking distribution below the knees in both lower extremity Motor strength is normal in bilateral deltoid, bicep, tricep, grip, wrist extensors   Tenderness to palpation over the left patellar tendon otherwise no tenderness to palpation over the knees. Has mild crepitus at the right knee. No evidence of joint effusion in the knee no evidence of erythema.  Left lateral malleolus with 8 cm x 6 cm  lipoma, nontender Lumbar range of motion 50% of normal but without any pain at end range. No tenderness palpation in the lumbar spine. Neck range of motion is full. Negative foraminal compression test  DTRs difficult to obtain, morbid obesity       Assessment & Plan:  1. Osteoarthritis both knees, more chronic and symptomatic on the right side. Has had good response with injections but none recently. Will schedule for ultrasound guided injection corticosteroid and monitor duration of treatment. If less than 3 months, would consider Synvisc 1 2. Back pain. Review of MRI from 7 years ago revealed lumbar facet arthrosis rated as moderate at L4-5 and L5-S1. She states she has had a more recent MRI done at another hospital, does not have any records for this. We discussed medial branch blocks and possible radiofrequency. #3. Left shoulder pain does not appear to be glenohumeral joint or a.c. joint related. Suspect radicular component. Will check EMG/N CV, LUE  We discussed chronic pain medications including  tramadol in combination with Tylenol. This is actually giving her fairly good relief. In addition she has a history of Anaphyllaxis associated with hydrocodone, therefore I am not recommending this medicine or related opioids. She is already tolerating tramadol without issues. I would recommend increasing tramadol to 2 tablets 3 times per day as needed in combination with Tylenol 500 mg 2 tablets 3 times per day as needed  Opioid risk tool score is low

## 2012-12-25 NOTE — Patient Instructions (Signed)
Next visit is for EMG Following that will need knee injection Following that we'll do back injection

## 2013-01-04 ENCOUNTER — Encounter (HOSPITAL_BASED_OUTPATIENT_CLINIC_OR_DEPARTMENT_OTHER): Payer: Medicare Other

## 2013-01-04 ENCOUNTER — Ambulatory Visit (INDEPENDENT_AMBULATORY_CARE_PROVIDER_SITE_OTHER): Payer: Medicare Other | Admitting: Internal Medicine

## 2013-01-04 DIAGNOSIS — D72829 Elevated white blood cell count, unspecified: Secondary | ICD-10-CM

## 2013-01-04 DIAGNOSIS — D473 Essential (hemorrhagic) thrombocythemia: Secondary | ICD-10-CM

## 2013-01-08 ENCOUNTER — Ambulatory Visit: Payer: Medicare Other | Admitting: Physical Medicine & Rehabilitation

## 2013-01-08 ENCOUNTER — Telehealth: Payer: Self-pay | Admitting: *Deleted

## 2013-01-08 NOTE — Telephone Encounter (Signed)
I lost my clonazepam and I cant refill until 10/11.  Can you call in a few to get me by until refill time. Please call and advise

## 2013-01-08 NOTE — Telephone Encounter (Signed)
I called patient. I will call in a small prescription for clonazepam with no refills. I called and 30 tablets, no refills.

## 2013-01-08 NOTE — Telephone Encounter (Signed)
Dr Anne Hahn, Patient says she has lost her Klonopin and it is too soon to refill until 10/11.  She would like a new Rx to last her until the fill date.  Please advise.  Thank you.

## 2013-02-05 ENCOUNTER — Ambulatory Visit: Payer: Medicare Other | Admitting: Physical Medicine & Rehabilitation

## 2013-02-05 ENCOUNTER — Encounter: Payer: Medicare Other | Attending: Physical Medicine & Rehabilitation

## 2013-02-05 DIAGNOSIS — M25519 Pain in unspecified shoulder: Secondary | ICD-10-CM | POA: Insufficient documentation

## 2013-02-05 DIAGNOSIS — M1710 Unilateral primary osteoarthritis, unspecified knee: Secondary | ICD-10-CM | POA: Insufficient documentation

## 2013-02-05 DIAGNOSIS — M549 Dorsalgia, unspecified: Secondary | ICD-10-CM | POA: Insufficient documentation

## 2013-02-05 DIAGNOSIS — G8929 Other chronic pain: Secondary | ICD-10-CM | POA: Insufficient documentation

## 2013-02-05 DIAGNOSIS — M47817 Spondylosis without myelopathy or radiculopathy, lumbosacral region: Secondary | ICD-10-CM | POA: Insufficient documentation

## 2013-02-10 ENCOUNTER — Encounter (INDEPENDENT_AMBULATORY_CARE_PROVIDER_SITE_OTHER): Payer: Medicare Other

## 2013-02-10 DIAGNOSIS — D72829 Elevated white blood cell count, unspecified: Secondary | ICD-10-CM

## 2013-02-10 DIAGNOSIS — N390 Urinary tract infection, site not specified: Secondary | ICD-10-CM

## 2013-02-15 ENCOUNTER — Ambulatory Visit (INDEPENDENT_AMBULATORY_CARE_PROVIDER_SITE_OTHER): Payer: Medicare Other | Admitting: Internal Medicine

## 2013-02-15 ENCOUNTER — Encounter (INDEPENDENT_AMBULATORY_CARE_PROVIDER_SITE_OTHER): Payer: Self-pay | Admitting: Internal Medicine

## 2013-02-15 VITALS — BP 104/70 | HR 80 | Temp 97.8°F | Resp 18 | Ht 63.0 in | Wt 285.2 lb

## 2013-02-15 DIAGNOSIS — K219 Gastro-esophageal reflux disease without esophagitis: Secondary | ICD-10-CM

## 2013-02-15 DIAGNOSIS — K589 Irritable bowel syndrome without diarrhea: Secondary | ICD-10-CM

## 2013-02-15 DIAGNOSIS — K3184 Gastroparesis: Secondary | ICD-10-CM

## 2013-02-15 MED ORDER — INULIN 1.5 G PO CHEW: 2.0000 | CHEWABLE_TABLET | Freq: Every day | ORAL | Status: DC

## 2013-02-15 NOTE — Patient Instructions (Addendum)
Drop Reglan or metoclopramide to 1 dose daily before evening meal. Can go back to Reglan twice daily if nausea and vomiting recurs. Call if you have able to obtain [place]per[place] from [place].

## 2013-02-15 NOTE — Progress Notes (Signed)
Presenting complaint;  Followup for nausea and vomiting/gastroparesis.  Subjective:  Patient is 62 year old Caucasian female who has history of gastroparesis and was last seen in July 2014 for nausea and vomiting. She has been on metoclopramide up to 40 mg per day last year. Dose was dropped eventually to 10 mg at bedtime. On her last visit of July 2014 with Ms. Norma Setzer,NP she was advised to take metoclopramide 5 mg twice daily. She states her nausea and vomiting resolved as soon as she stopped taking Citrucel. She has not taken promethazine in the last 3 months. She rarely experiences heartburn or regurgitation. She has not experienced abdominal pain this year. She states pain resolved after she took prednisone for several weeks. Her appetite is not good. She has lost 8 pounds since her last visit of 11/04/2012. She denies melena or rectal bleeding she does complain of frequent stooling. She passes small amount of stool every time she empties her bladder. Patient's last colonoscopy was in December 2011 with removal of small tubular adenoma from ascending colon. She is using Mycolog-II cream on an as-needed basis for skin rash in the inguinal areas and under her breasts.  Current Medications: Current Outpatient Prescriptions  Medication Sig Dispense Refill  . buPROPion (WELLBUTRIN XL) 150 MG 24 hr tablet Take 150 mg by mouth daily.       . Cetirizine HCl (ZYRTEC ALLERGY) 10 MG CAPS Take by mouth.      . ciprofloxacin (CIPRO) 500 MG tablet Take 500 mg by mouth 2 (two) times daily. Patient is taking for UTI      . clonazePAM (KLONOPIN) 0.5 MG tablet Take 1 tablet (0.5 mg total) by mouth 2 (two) times daily.  60 tablet  5  . EPINEPHrine (EPIPEN IJ) Inject as directed. As needed      . glyBURIDE (DIABETA) 2.5 MG tablet Take 5 mg by mouth daily with breakfast.       . hydrOXYzine (ATARAX/VISTARIL) 25 MG tablet Take 25 mg by mouth at bedtime.      . lamoTRIgine (LAMICTAL) 150 MG tablet Take 150 mg  by mouth daily.        [name] levothyroxine (SYNTHROID, LEVOTHROID) 100 MCG tablet Take 100 mcg by mouth daily.        [name] losartan (COZAAR) 50 MG tablet Take 50 mg by mouth daily.      . metFORMIN (GLUCOPHAGE) 500 MG tablet Take 500 mg by mouth 2 (two) times daily.       . metoCLOPramide (REGLAN) 5 MG tablet Take 1 tablet (5 mg total) by mouth 2 (two) times daily before a meal.  90 tablet  3  . naproxen sodium (ANAPROX) 220 MG tablet Take 220 mg by mouth. Patient states that she takes 2 by mouth daily      . nystatin-triamcinolone ointment (MYCOLOG) APPLY TWICE A DAY  60 g  0  . omeprazole (PRILOSEC) 20 MG capsule Take 20 mg by mouth daily.        . promethazine (PHENERGAN) 25 MG tablet Take 25 mg by mouth every 6 (six) hours as needed. 1/2 tablet twice daily as needed      . ranitidine (ZANTAC) 150 MG capsule Take 150 mg by mouth 2 (two) times daily.      . traMADol (ULTRAM) 50 MG tablet Take 2 tablets (100 mg total) by mouth every 8 (eight) hours as needed for pain.  180 tablet  5  . venlafaxine (EFFEXOR) 100 MG tablet Take 150 mg by  mouth 2 (two) times daily.         No current facility-administered medications for this visit.     Objective: Blood pressure 104/70, pulse 80, temperature 97.8 F (36.6 C), temperature source Oral, resp. rate 18, height 5\' 3"  (1.6 m), weight 285 lb 3.2 oz (129.366 kg). Patient is alert and in no acute distress. Conjunctiva is pink. Sclera is nonicteric Oropharyngeal mucosa is normal. No neck masses or thyromegaly noted. Cardiac exam with regular rhythm normal S1 and S2. No murmur or gallop noted. Lungs are clear to auscultation. Abdomen is full. Erythema noted to skin at the umbilicus. Abdomen is soft and nontender without organomegaly or masses.  No LE edema or clubbing noted. She has a lipoma on the outer aspect of left ankle about the size of small tangerine.   Assessment:  #1. Nausea and vomiting. Nausea and vomiting has resolved since she stopped  Citrucel. It must be preservative in Citrucel rather than psyllium itself. She has history of gastroparesis he is well documented on emptying study of January 2005 with T 1/2 of 167 minutes. She is on low-dose metoclopramide but ideally she should discontinue this medication. She will [place] to [place]peridone from [place] if she [place]. #2. GERD. Symptoms well-controlled with therapy. #3. Irritable bowel syndrome. She has frequent defecation. She needs to be back on fiber supplement.    Plan:  Drop metoclopramide dose to 5 mg before evening meal. Will switch her to domperidone at 10 mg by mouth twice a day if she is able to obtain this medication from [place]. Patient informed that this is not an FDA approved medication does not usually have CNS side effects Associated with metoclopramide. Fiber choice 2 tablets daily. Office visit in 6 months.

## 2013-03-18 ENCOUNTER — Encounter (INDEPENDENT_AMBULATORY_CARE_PROVIDER_SITE_OTHER): Payer: Self-pay

## 2013-03-18 ENCOUNTER — Ambulatory Visit (INDEPENDENT_AMBULATORY_CARE_PROVIDER_SITE_OTHER): Payer: Medicare Other | Admitting: Nurse Practitioner

## 2013-03-18 ENCOUNTER — Encounter: Payer: Self-pay | Admitting: Nurse Practitioner

## 2013-03-18 VITALS — BP 118/66 | HR 79 | Ht 64.0 in | Wt 289.0 lb

## 2013-03-18 DIAGNOSIS — R259 Unspecified abnormal involuntary movements: Secondary | ICD-10-CM

## 2013-03-18 DIAGNOSIS — R251 Tremor, unspecified: Secondary | ICD-10-CM

## 2013-03-18 DIAGNOSIS — G2581 Restless legs syndrome: Secondary | ICD-10-CM

## 2013-03-18 MED ORDER — CLONAZEPAM 0.5 MG PO TABS: ORAL_TABLET | ORAL | Status: DC

## 2013-03-18 NOTE — Progress Notes (Signed)
[facility]  PATIENT: ARINE FOLEY DOB: 03-30-1951   REASON FOR VISIT: Followup for restless leg syndrome and tremor  HISTORY OF PRESENT ILLNESS:Ms Macias, 62 year old female returns for followup. She was last seen in this office by Dr. Anne Hahn 09/17/2012. She has a history of benign essential tremor and mild restless legs. Her symptoms are well controlled on Klonopin however she states she gets daytime drowsiness. Questable allergic reaction to Mysoline in the past. Patient was recently taken off of Reglan. Her depression is managed by her primary care Dr.  Juanetta Gosling.   HISTORY: right-handed white female with a history of morbid obesity, degenerative arthritis, and a benign essential tremor. The patient has had a questionable reaction to the Mysoline with anaphylaxis, but the patient was on amoxicillin at the time of the reaction as well. The patient is now on clonazepam, and she is tolerating this medication fairly well. The patient indicates that it does help her tremor. The patient recently lost her prescription bottle, and she has been off the medication for several days. The patient returns for an evaluation. The patient reports some discomfort in the legs when she sits up for a certain length of time, or when she goes to bed at night. The patient notes that Ultram will help this discomfort when she takes it.    REVIEW OF SYSTEMS: Full 14 system review of systems performed and notable only for: All negative except where indicated Constitutional: Weight loss, fatigue Cardiovascular: N/A  Ear/Nose/Throat: N/A  Skin: N/A  Eyes: N/A  Respiratory: Shortness of breath Gastroitestinal: N/A  Hematology/Lymphatic: Easy bruising Endocrine: N/A Musculoskeletal: joint pain Allergy/Immunology: Allergies  Neurological: Weakness  Psychiatric: Depression too much sleep, decreased energy, disinterest in activities   ALLERGIES: Allergies  Allergen Reactions  . Amoxicillin  Anaphylaxis  . Hydrocodone Anaphylaxis  . Primidone Anaphylaxis  . Aspirin   . Depacon [Valproate Sodium]     Causes falls   . Lisinopril     HOME MEDICATIONS: Outpatient Prescriptions Prior to Visit  Medication Sig Dispense Refill  . buPROPion (WELLBUTRIN XL) 150 MG 24 hr tablet Take 150 mg by mouth daily.       . Cetirizine HCl (ZYRTEC ALLERGY) 10 MG CAPS Take 10 mg by mouth as needed.       . ciprofloxacin (CIPRO) 500 MG tablet Take 500 mg by mouth 2 (two) times daily. Patient is taking for UTI      . clonazePAM (KLONOPIN) 0.5 MG tablet Take 1 tablet (0.5 mg total) by mouth 2 (two) times daily.  60 tablet  5  . EPINEPHrine (EPIPEN IJ) Inject as directed. As needed      . glyBURIDE (DIABETA) 2.5 MG tablet Take 5 mg by mouth daily with breakfast.       . hydrOXYzine (ATARAX/VISTARIL) 25 MG tablet Take 25 mg by mouth at bedtime as needed.       . lamoTRIgine (LAMICTAL) 150 MG tablet Take 150 mg by mouth daily.        [name] levothyroxine (SYNTHROID, LEVOTHROID) 100 MCG tablet Take 100 mcg by mouth daily.        [name] losartan (COZAAR) 50 MG tablet Take 50 mg by mouth daily.      . metFORMIN (GLUCOPHAGE) 500 MG tablet Take 500 mg by mouth 2 (two) times daily.       . naproxen sodium (ANAPROX) 220 MG tablet Take 220 mg by mouth. Patient states that she takes 2 by mouth daily      .  nystatin-triamcinolone ointment (MYCOLOG) APPLY TWICE A DAY  60 g  0  . omeprazole (PRILOSEC) 20 MG capsule Take 20 mg by mouth daily.        . promethazine (PHENERGAN) 25 MG tablet Take 25 mg by mouth every 6 (six) hours as needed. 1/2 tablet twice daily as needed      . ranitidine (ZANTAC) 150 MG capsule Take 150 mg by mouth as needed.       . traMADol (ULTRAM) 50 MG tablet Take 2 tablets (100 mg total) by mouth every 8 (eight) hours as needed for pain.  180 tablet  5  . venlafaxine (EFFEXOR) 100 MG tablet Take 75 mg by mouth.       . Inulin 1.5 G CHEW Chew 2 tablets by mouth daily.      . metoCLOPramide (REGLAN) 5  MG tablet Take 1 tablet (5 mg total) by mouth 2 (two) times daily before a meal.  90 tablet  3   No facility-administered medications prior to visit.    PAST MEDICAL HISTORY: Past Medical History  Diagnosis Date  . Anemia   . GERD (gastroesophageal reflux disease)   . Hypertension   . Diabetes mellitus   . Morbid obesity   . Dyslipidemia   . Chronic daily headache   . Depression   . IBS (irritable bowel syndrome)   . Obstructive sleep apnea on CPAP   . Gastroparesis   . Bipolar disorder   . Hypothyroidism   . Restless legs syndrome (RLS) 09/17/2012  . Degenerative arthritis   . COPD (chronic obstructive pulmonary disease)     PAST SURGICAL HISTORY: Past Surgical History  Procedure Laterality Date  . Cholecystectomy    . Abdominal hysterectomy    . Lesions      Removed from tongue  . Rectal surgery      fissure  . Arthroscopy knee w/ drilling  06/2011    and Decemer of 2013.  [name] Shoulder surgery      arthroscopy in March of this year    FAMILY HISTORY: Family History  Problem Relation Age of Onset  . Hypertension Mother   . Arthritis Father   . Hypertension Sister     SOCIAL HISTORY: History   Social History  . Marital Status: Single    Spouse Name: N/A    Number of Children: 0  . Years of Education: 16   Occupational History  .     Social History Main Topics  . Smoking status: Former Smoker    Types: Cigarettes    Quit date: 02/04/1971  . Smokeless tobacco: Never Used     Comment: smoked 2 cigarettes a day  . Alcohol Use: No  . Drug Use: No  . Sexual Activity: Not on file   Other Topics Concern  . Not on file   Social History Narrative   Patient lives at home alone.    Patient has no children.    Patient has her masters in nursing.    Patient is single.      PHYSICAL EXAM  Filed Vitals:   03/18/13 1029  BP: 118/66  Pulse: 79  Height: 5\' 4"  (1.626 m)  Weight: 289 lb (131.09 kg)   Body mass index is 49.58 kg/(m^2).  Generalized:  Well developed, morbidly obese female in no acute distress  Neurological examination   Mentation: Alert oriented to time, place, history taking. Follows all commands speech and language fluent  Cranial nerve II-XII: Pupils were equal round reactive  to light extraocular movements were full, visual field were full on confrontational test. Facial sensation and strength were normal. hearing was intact to finger rubbing bilaterally. Uvula tongue midline. head turning and shoulder shrug and were normal and symmetric.Tongue protrusion into cheek strength was normal. Motor: normal bulk and tone, full strength in the BUE, BLE, fine finger movements normal, no pronator drift. No focal weakness, no tremor Coordination: finger-nose-finger, heel-to-shin bilaterally, no dysmetria Reflexes: Depressed upper and lower and symmetric Gait and Station: Rising up from seated position without assistance, wide based  stance,  moderate stride, good arm swing, smooth turning, able to perform tiptoe, and heel walking without difficulty. Tandem gait steady  DIAGNOSTIC DATA (LABS, IMAGING, TESTING) -None to review    ASSESSMENT AND PLAN  62 y.o. year old female  has a past medical history of  Morbid obesity;  Restless legs syndrome and essential tremor here for followup. The Klonopin is causing drowsiness during her daytime hours while  she is trying to work.  Continue Klonopin may aware it does cause drowsiness, will decrease to 1/2 tab during the day and full tab at hs, refills  Discussed using oversized utensils and writing instruments Followup in 6 months  Nilda Riggs, [facility], [facility], APRN  [facility] [address] [place], [place] [address] [phone]

## 2013-03-18 NOTE — Progress Notes (Signed)
I have read the note, and I agree with the clinical assessment and plan.  WILLIS,CHARLES KEITH   

## 2013-03-18 NOTE — Patient Instructions (Addendum)
Continue Klonopin may aware it does cause drowsiness, will decrease to 1/2 tab during the day and full tab at hs Discussed using oversized utensils and writing instruments Followup in 6 months

## 2013-03-19 ENCOUNTER — Other Ambulatory Visit: Payer: Self-pay | Admitting: Nurse Practitioner

## 2013-03-19 NOTE — Telephone Encounter (Signed)
Patient's prescription was faxed over to [facility] at [phone].

## 2013-03-29 ENCOUNTER — Ambulatory Visit: Payer: Medicare Other | Admitting: Physical Medicine & Rehabilitation

## 2013-07-02 ENCOUNTER — Telehealth (HOSPITAL_COMMUNITY): Payer: Self-pay | Admitting: Dietician

## 2013-07-02 NOTE — Telephone Encounter (Signed)
Received consult via fax from Dr. Hawkins for dx: diabetes  

## 2013-07-05 LAB — HM DIABETES EYE EXAM

## 2013-07-07 ENCOUNTER — Encounter (INDEPENDENT_AMBULATORY_CARE_PROVIDER_SITE_OTHER): Payer: Self-pay | Admitting: *Deleted

## 2013-07-08 NOTE — Telephone Encounter (Signed)
No response from previous contact attempt. Sent letter to pt home via US Mail in attempt to contact pt to schedule appointment.  

## 2013-07-12 NOTE — Telephone Encounter (Addendum)
Appointment scheduled for 08/16/13 at 1500. She reports group DM classes don't fit her needs because she has multiple health concerns. Pt disappointed that she cannot be sooner. Pt agreeable to be on waiting list if earlier appointment opens up.

## 2013-08-16 ENCOUNTER — Encounter (HOSPITAL_COMMUNITY): Payer: Self-pay | Admitting: Dietician

## 2013-08-16 NOTE — Progress Notes (Signed)
Pt was a no-show for appointment scheduled for 08/16/2013 at 1500.

## 2013-09-16 ENCOUNTER — Encounter (INDEPENDENT_AMBULATORY_CARE_PROVIDER_SITE_OTHER): Payer: Self-pay

## 2013-09-16 ENCOUNTER — Encounter: Payer: Self-pay | Admitting: Nurse Practitioner

## 2013-09-16 ENCOUNTER — Ambulatory Visit (INDEPENDENT_AMBULATORY_CARE_PROVIDER_SITE_OTHER): Payer: Medicare Other | Admitting: Nurse Practitioner

## 2013-09-16 VITALS — BP 143/74 | HR 95 | Ht 64.0 in | Wt 273.0 lb

## 2013-09-16 DIAGNOSIS — R251 Tremor, unspecified: Secondary | ICD-10-CM

## 2013-09-16 DIAGNOSIS — G2581 Restless legs syndrome: Secondary | ICD-10-CM

## 2013-09-16 DIAGNOSIS — R259 Unspecified abnormal involuntary movements: Secondary | ICD-10-CM

## 2013-09-16 MED ORDER — CLONAZEPAM 0.5 MG PO TABS: 0.5000 mg | ORAL_TABLET | Freq: Two times a day (BID) | ORAL | Status: DC

## 2013-09-16 NOTE — Patient Instructions (Addendum)
Increase Klonopin to one tablet twice daily Rx given Tremor is stable Followup in one year Next visit with Dr. Jannifer Franklin

## 2013-09-16 NOTE — Progress Notes (Signed)
[facility]  PATIENT: Norma [name] DOB: [birth year]   REASON FOR VISIT: follow up for tremor, RLS   HISTORY OF PRESENT ILLNESS: Norma [name], 63 year old female returns for followup. She has a history of restless leg syndrome and an essential tremor. When last seen she was asked to decrease her Klonopin during the day due to drowsiness but her tremor was less controlled so she went back to one tablet twice a day. Questable allergic reaction to Mysoline in the past. Patient was recently taken off of Reglan. Her depression is managed by her primary care Dr. Luan [name]. She also has obstructive sleep apnea and is diabetic. Most recent hemoglobin A1c 7.4. She returns for reevaluation   HISTORY: right-handed white female with a history of morbid obesity, degenerative arthritis, and a benign essential tremor. The patient has had a questionable reaction to the Mysoline with anaphylaxis, but the patient was on amoxicillin at the time of the reaction as well. The patient is now on clonazepam, and she is tolerating this medication fairly well. The patient indicates that it does help her tremor. The patient recently lost her prescription bottle, and she has been off the medication for several days. The patient returns for an evaluation. The patient reports some discomfort in the legs when she sits up for a certain length of time, or when she goes to bed at night. The patient notes that Ultram will help this discomfort when she takes it.    REVIEW OF SYSTEMS: Full 14 system review of systems performed and notable only for those listed, all others are neg:  Constitutional: N/A  Cardiovascular: N/A  Ear/Nose/Throat: N/A  Skin: N/A  Eyes: N/A  Respiratory: N/A  Gastroitestinal: N/A  Hematology/Lymphatic: N/A  Endocrine: N/A Musculoskeletal:N/A  Allergy/Immunology: N/A  Neurological: N/A Psychiatric: N/A Sleep : NA   ALLERGIES: Allergies  Allergen Reactions  . Amoxicillin  Anaphylaxis  . Hydrocodone Anaphylaxis  . Primidone Anaphylaxis  . Aspirin   . Depacon [Valproate Sodium]     Causes falls   . Lisinopril     HOME MEDICATIONS: Outpatient Prescriptions Prior to Visit  Medication Sig Dispense Refill  . buPROPion (WELLBUTRIN XL) 150 MG 24 hr tablet Take 150 mg by mouth daily.       . Cetirizine HCl (ZYRTEC ALLERGY) 10 MG CAPS Take 10 mg by mouth as needed.       . clonazePAM (KLONOPIN) 0.5 MG tablet 1/2 tab in the day and full tab at night  45 tablet  5  . EPINEPHrine (EPIPEN IJ) Inject as directed. As needed      . glyBURIDE (DIABETA) 2.5 MG tablet Take 5 mg by mouth 2 (two) times daily with a meal.       . hydrOXYzine (ATARAX/VISTARIL) 25 MG tablet Take 25 mg by mouth at bedtime as needed.       . lamoTRIgine (LAMICTAL) 150 MG tablet Take 150 mg by mouth daily.        [name] levothyroxine (SYNTHROID, LEVOTHROID) 100 MCG tablet Take 100 mcg by mouth daily.        [name] losartan (COZAAR) 50 MG tablet Take 50 mg by mouth daily.      . metFORMIN (GLUCOPHAGE) 500 MG tablet Take 500 mg by mouth 2 (two) times daily.       . naproxen sodium (ANAPROX) 220 MG tablet Take 220 mg by mouth as needed. Patient states that she takes 2 by mouth daily      . nystatin-triamcinolone ointment (  MYCOLOG) APPLY TWICE A DAY  60 g  0  . omeprazole (PRILOSEC) 20 MG capsule Take 20 mg by mouth daily.        . promethazine (PHENERGAN) 25 MG tablet Take 25 mg by mouth every 6 (six) hours as needed. 1/2 tablet twice daily as needed      . ranitidine (ZANTAC) 150 MG capsule Take 150 mg by mouth as needed.       . traMADol (ULTRAM) 50 MG tablet Take 2 tablets (100 mg total) by mouth every 8 (eight) hours as needed for pain.  180 tablet  5  . venlafaxine (EFFEXOR) 100 MG tablet Take 150 mg by mouth 2 (two) times daily.       . ciprofloxacin (CIPRO) 500 MG tablet Take 500 mg by mouth 2 (two) times daily. Patient is taking for UTI       No facility-administered medications prior to visit.     PAST MEDICAL HISTORY: Past Medical History  Diagnosis Date  . Anemia   . GERD (gastroesophageal reflux disease)   . Hypertension   . Diabetes mellitus   . Morbid obesity   . Dyslipidemia   . Chronic daily headache   . Depression   . IBS (irritable bowel syndrome)   . Obstructive sleep apnea on CPAP   . Gastroparesis   . Bipolar disorder   . Hypothyroidism   . Restless legs syndrome (RLS) 09/17/2012  . Degenerative arthritis   . COPD (chronic obstructive pulmonary disease)     PAST SURGICAL HISTORY: Past Surgical History  Procedure Laterality Date  . Cholecystectomy    . Abdominal hysterectomy    . Lesions      Removed from tongue  . Rectal surgery      fissure  . Arthroscopy knee w/ drilling  06/2011    and Decemer of 2013.  [name] Shoulder surgery      arthroscopy in March of this year    FAMILY HISTORY: Family History  Problem Relation Age of Onset  . Hypertension Mother   . Arthritis Father   . Hypertension Sister     SOCIAL HISTORY: History   Social History  . Marital Status: Single    Spouse Name: N/A    Number of Children: 0  . Years of Education: 16   Occupational History  .     Social History Main Topics  . Smoking status: Former Smoker    Types: Cigarettes    Quit date: 02/04/1971  . Smokeless tobacco: Never Used     Comment: smoked 2 cigarettes a day  . Alcohol Use: No  . Drug Use: No  . Sexual Activity: Not on file   Other Topics Concern  . Not on file   Social History Narrative   Patient lives at home alone.    Patient has no children.    Patient has her masters in nursing.    Patient is single.      PHYSICAL EXAM  Filed Vitals:   09/16/13 1108  BP: 143/74  Pulse: 95  Height: 5\' 4"  (1.626 m)  Weight: 273 lb (123.832 kg)   Body mass index is 46.84 kg/(m^2).  Generalized: Well developed, morbidly obese female in no acute distress  Head: normocephalic and atraumatic,. [place] 3  Neck: Supple, no carotid bruits   Cardiac: Regular rate rhythm, no murmur  Musculoskeletal: No deformity   Neurological examination   Mentation: Alert oriented to time, place, history taking. Follows all commands speech and language fluent  Cranial nerve II-XII: Pupils were equal round reactive to light extraocular movements were full, visual field were full on confrontational test. Facial sensation and strength were normal. hearing was intact to finger rubbing bilaterally. Uvula tongue midline. head turning and shoulder shrug were normal and symmetric.Tongue protrusion into cheek strength was normal. Motor: normal bulk and tone, full strength in the BUE, BLE, fine finger movements normal, no pronator drift. No focal weakness Coordination: finger-nose-finger, heel-to-shin bilaterally, no dysmetria Reflexes: Depressed upper and lower and symmetric, plantar responses were flexor bilaterally. Gait and Station: Rising up from seated position without assistance, wide based stance,  moderate stride, good arm swing, smooth turning, able to perform tiptoe, and heel walking without difficulty. Tandem gait is unsteady. Romberg negative  DIAGNOSTIC DATA (LABS, IMAGING, TESTING) -  ASSESSMENT AND PLAN  63 y.o. year old female  has a past medical history restless leg syndrome and essential tremor here for followup.  Increase Klonopin to one tablet twice daily Rx given Tremor is stable Followup in one year Next visit with Dr. Jerline Pain, [facility], [facility], APRN  [facility] [address], [place], [place] [address] [phone]

## 2013-09-16 NOTE — Progress Notes (Signed)
I have read the note, and I agree with the clinical assessment and plan.  Keyoni Lapinski K Edessa Jakubowicz   

## 2013-11-09 ENCOUNTER — Ambulatory Visit (INDEPENDENT_AMBULATORY_CARE_PROVIDER_SITE_OTHER): Payer: Medicare Other | Admitting: Internal Medicine

## 2013-11-15 ENCOUNTER — Telehealth (INDEPENDENT_AMBULATORY_CARE_PROVIDER_SITE_OTHER): Payer: Self-pay | Admitting: *Deleted

## 2013-11-15 ENCOUNTER — Encounter (INDEPENDENT_AMBULATORY_CARE_PROVIDER_SITE_OTHER): Payer: Self-pay | Admitting: *Deleted

## 2013-11-15 NOTE — Telephone Encounter (Signed)
Norma [name] No Showed for her apt with Dr. Laural Golden on 11/09/13. A NS Letter has been mailed.

## 2014-03-02 ENCOUNTER — Encounter: Payer: Self-pay | Admitting: Neurology

## 2014-03-08 ENCOUNTER — Encounter: Payer: Self-pay | Admitting: Neurology

## 2014-03-08 ENCOUNTER — Ambulatory Visit (INDEPENDENT_AMBULATORY_CARE_PROVIDER_SITE_OTHER): Payer: Medicare Other | Admitting: Internal Medicine

## 2014-03-17 ENCOUNTER — Encounter (INDEPENDENT_AMBULATORY_CARE_PROVIDER_SITE_OTHER): Payer: Self-pay | Admitting: Internal Medicine

## 2014-03-17 ENCOUNTER — Ambulatory Visit (INDEPENDENT_AMBULATORY_CARE_PROVIDER_SITE_OTHER): Payer: Medicare Other | Admitting: Internal Medicine

## 2014-03-17 VITALS — BP 118/60 | HR 64 | Temp 97.5°F | Ht 65.0 in | Wt 272.0 lb

## 2014-03-17 DIAGNOSIS — K3184 Gastroparesis: Secondary | ICD-10-CM

## 2014-03-17 DIAGNOSIS — K219 Gastro-esophageal reflux disease without esophagitis: Secondary | ICD-10-CM

## 2014-03-17 MED ORDER — OMEPRAZOLE 40 MG PO CPDR: 40.0000 mg | DELAYED_RELEASE_CAPSULE | Freq: Every day | ORAL | Status: DC

## 2014-03-17 NOTE — Progress Notes (Signed)
Subjective:    Patient ID: Norma [name], female    DOB: 1950-12-08, 63 y.o.   MRN: [id number]  HPI Hx of gastroparesis and last seen in November of last year for nausea and vomiting. She is maintained on Reglan for her gastroparesis.  Last colonoscopy was in 2011 with removal of small tubular adenoma from ascending colon.  Presents today with c/o nausea since the spring. She has lost 13 pounds since her last visit. Hx of chronic nausea. She says the phenergan does not work by itself, she has to take the [place]. She really has not been taking the Reglan for about a year. Started again about 3-4 days. She has not had any side effects from the Reglan.  She says her appetite is not good.  Since being on the Reglan her appetite is better. No abdominal pain.  No tremors noted.   05/05/2003 Gastric emptying study       Clinical Data: History of abdominal pain and nausea.    GASTRIC EMPTYING STUDY   Gastric emptying study was performed following 2.1 mCi sulfur colloid with egg.   The images of abdomen anterior and posterior views show delayed gastric emptying with a T-1/2 of 167 minutes (normal between 60 to 90 minutes).    IMPRESSION   Delayed gastric emptying with a T-1/2 of 167 minutes.     Review of Systems Past Medical History  Diagnosis Date  . Anemia   . GERD (gastroesophageal reflux disease)   . Hypertension   . Diabetes mellitus   . Morbid obesity   . Dyslipidemia   . Chronic daily headache   . Depression   . IBS (irritable bowel syndrome)   . Obstructive sleep apnea on CPAP   . Gastroparesis   . Bipolar disorder   . Hypothyroidism   . Restless legs syndrome (RLS) 09/17/2012  . Degenerative arthritis   . COPD (chronic obstructive pulmonary disease)     Past Surgical History  Procedure Laterality Date  . Cholecystectomy    . Abdominal hysterectomy    . Lesions      Removed from tongue  . Rectal surgery      fissure  . Arthroscopy knee w/ drilling  06/2011    and  Decemer of 2013.  [name] Shoulder surgery      arthroscopy in March of this year    Allergies  Allergen Reactions  . Amoxicillin Anaphylaxis  . Hydrocodone Anaphylaxis  . Primidone Anaphylaxis  . Aspirin   . Depacon [Valproate Sodium]     Causes falls   . Lisinopril     Current Outpatient Prescriptions on File Prior to Visit  Medication Sig Dispense Refill  . buPROPion (WELLBUTRIN XL) 150 MG 24 hr tablet Take 150 mg by mouth daily.     . Cetirizine HCl (ZYRTEC ALLERGY) 10 MG CAPS Take 10 mg by mouth as needed.     . clonazePAM (KLONOPIN) 0.5 MG tablet Take 1 tablet (0.5 mg total) by mouth 2 (two) times daily. 60 tablet 5  . EPINEPHrine (EPIPEN IJ) Inject as directed. As needed    . gabapentin (NEURONTIN) 100 MG capsule Take 200 mg by mouth 2 (two) times daily.    [name] glyBURIDE (DIABETA) 2.5 MG tablet Take 5 mg by mouth 2 (two) times daily with a meal.     . hydrOXYzine (ATARAX/VISTARIL) 25 MG tablet Take 25 mg by mouth at bedtime as needed.     . lamoTRIgine (LAMICTAL) 150 MG tablet Take 150  mg by mouth daily.      [name] levothyroxine (SYNTHROID, LEVOTHROID) 100 MCG tablet Take 100 mcg by mouth daily.      [name] losartan (COZAAR) 50 MG tablet Take 50 mg by mouth daily.    . metFORMIN (GLUCOPHAGE) 500 MG tablet Take 500 mg by mouth 2 (two) times daily.     [name] nystatin-triamcinolone ointment (MYCOLOG) APPLY TWICE A DAY 60 g 0  . promethazine (PHENERGAN) 25 MG tablet Take 25 mg by mouth every 6 (six) hours as needed. 1/2 tablet twice daily as needed    . traMADol (ULTRAM) 50 MG tablet Take 2 tablets (100 mg total) by mouth every 8 (eight) hours as needed for pain. 180 tablet 5  . venlafaxine (EFFEXOR) 100 MG tablet Take 150 mg by mouth 2 (two) times daily.      No current facility-administered medications on file prior to visit.        Objective:   Physical Exam  Filed Vitals:   03/17/14 1142  Height: 5\' 5"  (1.651 m)  Weight: 272 lb (123.378 kg)   Alert and oriented. Skin warm and dry.  Oral mucosa is moist.   . Sclera anicteric, conjunctivae is pink. Thyroid not enlarged. No cervical lymphadenopathy. Lungs clear. Heart regular rate and rhythm.  Abdomen is soft. Bowel sounds are positive. No hepatomegaly. No abdominal masses felt. No tenderness.  No edema to lower extremities.  Obese       Assessment & Plan:  Gastroparesis. Nausea. Has been taking Reglan prn for last couple of days with improvement in her appetite. Start Domperidone 10mg  BID and she how she does. She was informed Domperidone was not FDA approved. Stop the Reglan.

## 2014-03-17 NOTE — Patient Instructions (Signed)
Rx for Domperidone 10mg  BID. OV in 1 year.

## 2014-03-18 ENCOUNTER — Other Ambulatory Visit (HOSPITAL_COMMUNITY): Payer: Self-pay | Admitting: Pulmonary Disease

## 2014-03-18 DIAGNOSIS — R200 Anesthesia of skin: Secondary | ICD-10-CM

## 2014-03-25 ENCOUNTER — Ambulatory Visit (HOSPITAL_COMMUNITY): Payer: Medicare Other

## 2014-03-29 ENCOUNTER — Encounter (HOSPITAL_COMMUNITY): Payer: Self-pay | Admitting: *Deleted

## 2014-03-29 ENCOUNTER — Emergency Department (HOSPITAL_COMMUNITY)
Admission: EM | Admit: 2014-03-29 | Discharge: 2014-03-29 | Disposition: A | Discharge status: Home / Self Care | Payer: Medicare Other | Attending: Emergency Medicine | Admitting: Emergency Medicine

## 2014-03-29 DIAGNOSIS — J449 Chronic obstructive pulmonary disease, unspecified: Secondary | ICD-10-CM | POA: Insufficient documentation

## 2014-03-29 DIAGNOSIS — Z79899 Other long term (current) drug therapy: Secondary | ICD-10-CM | POA: Diagnosis not present

## 2014-03-29 DIAGNOSIS — Z8739 Personal history of other diseases of the musculoskeletal system and connective tissue: Secondary | ICD-10-CM | POA: Insufficient documentation

## 2014-03-29 DIAGNOSIS — Z7982 Long term (current) use of aspirin: Secondary | ICD-10-CM | POA: Insufficient documentation

## 2014-03-29 DIAGNOSIS — Z9981 Dependence on supplemental oxygen: Secondary | ICD-10-CM | POA: Diagnosis not present

## 2014-03-29 DIAGNOSIS — G8929 Other chronic pain: Secondary | ICD-10-CM | POA: Insufficient documentation

## 2014-03-29 DIAGNOSIS — Z87891 Personal history of nicotine dependence: Secondary | ICD-10-CM | POA: Insufficient documentation

## 2014-03-29 DIAGNOSIS — E039 Hypothyroidism, unspecified: Secondary | ICD-10-CM | POA: Insufficient documentation

## 2014-03-29 DIAGNOSIS — Z862 Personal history of diseases of the blood and blood-forming organs and certain disorders involving the immune mechanism: Secondary | ICD-10-CM | POA: Diagnosis not present

## 2014-03-29 DIAGNOSIS — I10 Essential (primary) hypertension: Secondary | ICD-10-CM | POA: Insufficient documentation

## 2014-03-29 DIAGNOSIS — R51 Headache: Secondary | ICD-10-CM | POA: Diagnosis not present

## 2014-03-29 DIAGNOSIS — E119 Type 2 diabetes mellitus without complications: Secondary | ICD-10-CM | POA: Diagnosis not present

## 2014-03-29 DIAGNOSIS — R42 Dizziness and giddiness: Secondary | ICD-10-CM | POA: Diagnosis present

## 2014-03-29 DIAGNOSIS — G4733 Obstructive sleep apnea (adult) (pediatric): Secondary | ICD-10-CM | POA: Insufficient documentation

## 2014-03-29 DIAGNOSIS — E785 Hyperlipidemia, unspecified: Secondary | ICD-10-CM | POA: Insufficient documentation

## 2014-03-29 DIAGNOSIS — K219 Gastro-esophageal reflux disease without esophagitis: Secondary | ICD-10-CM | POA: Diagnosis not present

## 2014-03-29 DIAGNOSIS — Z880 Allergy status to penicillin: Secondary | ICD-10-CM | POA: Insufficient documentation

## 2014-03-29 MED ORDER — MECLIZINE HCL 12.5 MG PO TABS
25.0000 mg | ORAL_TABLET | Freq: Once | ORAL | Status: DC
  Filled 2014-03-29: qty 2

## 2014-03-29 MED ORDER — PROMETHAZINE HCL 12.5 MG PO TABS
25.0000 mg | ORAL_TABLET | Freq: Four times a day (QID) | ORAL | Status: DC | PRN
  Administered 2014-03-29: 25 mg via ORAL
  Filled 2014-03-29: qty 2

## 2014-03-29 MED ORDER — MECLIZINE HCL 12.5 MG PO TABS
25.0000 mg | ORAL_TABLET | Freq: Once | ORAL | Status: AC
  Administered 2014-03-29: 25 mg via ORAL
  Filled 2014-03-29: qty 2

## 2014-03-29 MED ORDER — MECLIZINE HCL 25 MG PO TABS: 25.0000 mg | ORAL_TABLET | Freq: Three times a day (TID) | ORAL | Status: DC | PRN

## 2014-03-29 NOTE — ED Notes (Signed)
Onset 10 am with dizziness, vomited x 2,  No HI.  Feels like 'Room spinning",

## 2014-03-29 NOTE — ED Notes (Signed)
Antivert given to patient to take at home in the morning as directed by Dr. Wilson Singer.

## 2014-03-29 NOTE — Discharge Instructions (Signed)

## 2014-03-29 NOTE — ED Provider Notes (Signed)
CSN: [id number]     Arrival date & time 03/29/14  1840 History  This chart was scribed for Virgel Manifold, MD by Lowella Petties, ED Scribe. The patient was seen in room APA04/APA04. Patient's care was started at 7:34 PM.    Chief Complaint  Patient presents with  . Dizziness   The history is provided by the patient. No language interpreter was used.   HPI Comments: Norma [name] is a 63 y.o. female with a history of DM who presents to the Emergency Department complaining of dizziness which began 8 hours ago. She describes her dizziness as "the room spinning." She states that the dizziness did not go away, but reports that it is alleviated by sitting down and laying on her side. She reports associated light headedness "like she was going to pass out" about 1 hour ago which is what brought her to the ED. She reports 2 episodes of associated vomiting today. She reports a history of baseline pain in her knees and headaches. She denies changes in hearing, ringing in her ears, or fullness. She reports taking Lasix.  Past Medical History  Diagnosis Date  . Anemia   . GERD (gastroesophageal reflux disease)   . Hypertension   . Diabetes mellitus   . Morbid obesity   . Dyslipidemia   . Chronic daily headache   . Depression   . IBS (irritable bowel syndrome)   . Obstructive sleep apnea on CPAP   . Gastroparesis   . Bipolar disorder   . Hypothyroidism   . Restless legs syndrome (RLS) 09/17/2012  . Degenerative arthritis   . COPD (chronic obstructive pulmonary disease)    Past Surgical History  Procedure Laterality Date  . Cholecystectomy    . Abdominal hysterectomy    . Lesions      Removed from tongue  . Rectal surgery      fissure  . Arthroscopy knee w/ drilling  06/2011    and Decemer of 2013.  [name] Shoulder surgery      arthroscopy in March of this year   Family History  Problem Relation Age of Onset  . Hypertension Mother   . Arthritis Father   . Hypertension Sister    History   Substance Use Topics  . Smoking status: Former Smoker    Types: Cigarettes    Quit date: 02/04/1971  . Smokeless tobacco: Never Used     Comment: smoked 2 cigarettes a day  . Alcohol Use: No   OB History    No data available     Review of Systems  Musculoskeletal: Positive for arthralgias (knees bilaterally).  Neurological: Positive for dizziness, light-headedness and headaches.  All other systems reviewed and are negative.  Allergies  Amoxicillin; Hydrocodone; Primidone; Aspirin; Depacon; and Lisinopril  Home Medications   Prior to Admission medications   Medication Sig Start Date End Date Taking? Authorizing Provider  buPROPion (WELLBUTRIN XL) 150 MG 24 hr tablet Take 150 mg by mouth daily.     Historical Provider, MD  Cetirizine HCl (ZYRTEC ALLERGY) 10 MG CAPS Take 10 mg by mouth as needed.     Historical Provider, MD  clonazePAM (KLONOPIN) 0.5 MG tablet Take 1 tablet (0.5 mg total) by mouth 2 (two) times daily. 09/16/13   Dennie Bible, NP  Dulaglutide (TRULICITY) 9.93 ZJ/6.9CV SOPN Inject into the skin.    Historical Provider, MD  EPINEPHrine (EPIPEN IJ) Inject as directed. As needed    Historical Provider, MD  gabapentin (NEURONTIN) 100  MG capsule Take 200 mg by mouth 2 (two) times daily.    Historical Provider, MD  glyBURIDE (DIABETA) 2.5 MG tablet Take 5 mg by mouth 2 (two) times daily with a meal.     Historical Provider, MD  hydrOXYzine (ATARAX/VISTARIL) 25 MG tablet Take 25 mg by mouth at bedtime as needed.     Historical Provider, MD  lamoTRIgine (LAMICTAL) 150 MG tablet Take 150 mg by mouth daily.      Historical Provider, MD  levothyroxine (SYNTHROID, LEVOTHROID) 100 MCG tablet Take 100 mcg by mouth daily.      Historical Provider, MD  losartan (COZAAR) 50 MG tablet Take 50 mg by mouth daily.    Historical Provider, MD  metFORMIN (GLUCOPHAGE) 500 MG tablet Take 500 mg by mouth 2 (two) times daily.     Historical Provider, MD  metoCLOPramide (REGLAN) 10 MG  tablet Take 10 mg by mouth at bedtime.    Historical Provider, MD  nystatin-triamcinolone ointment (MYCOLOG) APPLY TWICE A DAY 02/05/12   Rogene Houston, MD  omeprazole (PRILOSEC) 40 MG capsule Take 1 capsule (40 mg total) by mouth daily. 03/17/14   Butch Penny, NP  promethazine (PHENERGAN) 25 MG tablet Take 25 mg by mouth every 6 (six) hours as needed. 1/2 tablet twice daily as needed    Historical Provider, MD  traMADol (ULTRAM) 50 MG tablet Take 2 tablets (100 mg total) by mouth every 8 (eight) hours as needed for pain. 12/25/12   Charlett Blake, MD  venlafaxine (EFFEXOR) 100 MG tablet Take 150 mg by mouth 2 (two) times daily.     Historical Provider, MD   Triage Vitals: BP 133/73 mmHg  Pulse 89  Temp(Src) 99.1 F (37.3 C) (Oral)  Resp 20  Ht 5' 3.75" (1.619 m)  Wt 273 lb (123.832 kg)  BMI 47.24 kg/m2  SpO2 97% Physical Exam  Constitutional: She is oriented to person, place, and time. She appears well-developed and well-nourished. No distress.  HENT:  Head: Normocephalic and atraumatic.  Eyes: EOM are normal.  Neck: Normal range of motion.  Cardiovascular: Normal rate, regular rhythm and normal heart sounds.   Pulmonary/Chest: Effort normal and breath sounds normal.  Abdominal: Soft. She exhibits no distension. There is no tenderness.  Musculoskeletal: Normal range of motion.  Neurological: She is alert and oriented to person, place, and time. She has normal reflexes. No cranial nerve deficit. She exhibits normal muscle tone. Coordination normal.  Good FTN testing bilaterally  Skin: Skin is warm and dry.  Psychiatric: She has a normal mood and affect. Judgment normal.  Nursing note and vitals reviewed.   ED Course  Procedures (including critical care time) DIAGNOSTIC STUDIES: Oxygen Saturation is 97% on room air, normal by my interpretation.    COORDINATION OF CARE: 7:40 PM-Discussed treatment plan which includes Antivert and at home exercises with pt at bedside and pt  agreed to plan.   Labs Review Labs Reviewed - No data to display  Imaging Review No results found.   EKG Interpretation   Date/Time:  Tuesday March 29 2014 19:16:28 EST Ventricular Rate:  86 PR Interval:  178 QRS Duration: 149 QT Interval:  470 QTC Calculation: 562 R Axis:   33 Text Interpretation:  Sinus rhythm Left bundle branch block lbbb nored on  previous Confirmed by Waylan Busta  MD, Shaindel Sweeten ([id number]) on 03/29/2014 7:18:09 PM      MDM   Final diagnoses:  Vertigo   63 year old female with symptoms consistent with vertigo.  Suspect peripheral etiology. Positional. Reproducible. Nonfocal neurological examination. Doubt central cause. Complaints treatment. Return precautions were discussed.   I personally preformed the services scribed in my presence. The recorded information has been reviewed is accurate. Virgel Manifold, MD.       Virgel Manifold, MD 04/11/14 [phone]

## 2014-03-30 ENCOUNTER — Ambulatory Visit (HOSPITAL_COMMUNITY): Payer: Medicare Other

## 2014-04-05 ENCOUNTER — Ambulatory Visit (HOSPITAL_COMMUNITY): Payer: Medicare Other

## 2014-04-06 ENCOUNTER — Other Ambulatory Visit: Payer: Self-pay | Admitting: Nurse Practitioner

## 2014-04-11 ENCOUNTER — Other Ambulatory Visit: Payer: Self-pay

## 2014-04-11 MED ORDER — CLONAZEPAM 0.5 MG PO TABS: 0.5000 mg | ORAL_TABLET | Freq: Two times a day (BID) | ORAL | Status: DC

## 2014-04-11 NOTE — Telephone Encounter (Signed)
Rx signed and faxed.

## 2014-05-16 ENCOUNTER — Ambulatory Visit (INDEPENDENT_AMBULATORY_CARE_PROVIDER_SITE_OTHER): Payer: Medicare Other | Admitting: Internal Medicine

## 2014-06-02 ENCOUNTER — Other Ambulatory Visit (HOSPITAL_COMMUNITY): Payer: Self-pay | Admitting: Pulmonary Disease

## 2014-06-02 DIAGNOSIS — R29898 Other symptoms and signs involving the musculoskeletal system: Secondary | ICD-10-CM

## 2014-06-02 DIAGNOSIS — M25512 Pain in left shoulder: Secondary | ICD-10-CM

## 2014-06-10 ENCOUNTER — Other Ambulatory Visit (HOSPITAL_COMMUNITY): Payer: Self-pay | Admitting: Pulmonary Disease

## 2014-06-10 ENCOUNTER — Ambulatory Visit (HOSPITAL_COMMUNITY)
Admission: RE | Admit: 2014-06-10 | Discharge: 2014-06-10 | Disposition: A | Discharge status: Home / Self Care | Payer: PRIVATE HEALTH INSURANCE | Source: Ambulatory Visit | Attending: Pulmonary Disease | Admitting: Pulmonary Disease

## 2014-06-10 DIAGNOSIS — R937 Abnormal findings on diagnostic imaging of other parts of musculoskeletal system: Secondary | ICD-10-CM | POA: Diagnosis not present

## 2014-06-10 DIAGNOSIS — R531 Weakness: Secondary | ICD-10-CM | POA: Insufficient documentation

## 2014-06-10 DIAGNOSIS — R29898 Other symptoms and signs involving the musculoskeletal system: Secondary | ICD-10-CM

## 2014-06-10 DIAGNOSIS — M79645 Pain in left finger(s): Secondary | ICD-10-CM

## 2014-06-10 DIAGNOSIS — M5126 Other intervertebral disc displacement, lumbar region: Secondary | ICD-10-CM | POA: Diagnosis not present

## 2014-06-10 DIAGNOSIS — M25512 Pain in left shoulder: Secondary | ICD-10-CM

## 2014-06-13 ENCOUNTER — Telehealth (INDEPENDENT_AMBULATORY_CARE_PROVIDER_SITE_OTHER): Payer: Self-pay | Admitting: *Deleted

## 2014-06-13 ENCOUNTER — Encounter (INDEPENDENT_AMBULATORY_CARE_PROVIDER_SITE_OTHER): Payer: Self-pay | Admitting: *Deleted

## 2014-06-13 NOTE — Telephone Encounter (Signed)
Norma [name] No Showed for her apt with Dr. Laural Golden on 05/16/14. A NS letter has been mailed.

## 2014-06-13 NOTE — Telephone Encounter (Signed)
Noted  

## 2014-07-15 ENCOUNTER — Telehealth: Payer: Self-pay | Admitting: Neurology
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
## 2014-07-15 NOTE — Telephone Encounter (Signed)
I called the patient, unable to talk to the patient, unable to leave a message. I will allow an early refill. The patient apparently lost her clonazepam prescription in September 2014, she was given a two-week supply at that time. This is the first time this is happened since then.
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
## 2014-09-20 ENCOUNTER — Ambulatory Visit (INDEPENDENT_AMBULATORY_CARE_PROVIDER_SITE_OTHER): Payer: Medicare Other | Admitting: Neurology

## 2014-09-20 ENCOUNTER — Encounter: Payer: Self-pay | Admitting: Neurology

## 2014-09-20 VITALS — BP 138/74 | HR 84 | Ht 64.0 in | Wt 285.4 lb

## 2014-09-20 DIAGNOSIS — E538 Deficiency of other specified B group vitamins: Secondary | ICD-10-CM

## 2014-09-20 DIAGNOSIS — G629 Polyneuropathy, unspecified: Secondary | ICD-10-CM | POA: Diagnosis not present

## 2014-09-20 DIAGNOSIS — E119 Type 2 diabetes mellitus without complications: Secondary | ICD-10-CM

## 2014-09-20 DIAGNOSIS — M5442 Lumbago with sciatica, left side: Secondary | ICD-10-CM | POA: Insufficient documentation

## 2014-09-20 DIAGNOSIS — R251 Tremor, unspecified: Secondary | ICD-10-CM

## 2014-09-20 DIAGNOSIS — M545 Low back pain, unspecified: Secondary | ICD-10-CM

## 2014-09-20 DIAGNOSIS — G2581 Restless legs syndrome: Secondary | ICD-10-CM | POA: Diagnosis not present

## 2014-09-20 DIAGNOSIS — R413 Other amnesia: Secondary | ICD-10-CM | POA: Diagnosis not present

## 2014-09-20 DIAGNOSIS — E1142 Type 2 diabetes mellitus with diabetic polyneuropathy: Secondary | ICD-10-CM

## 2014-09-20 DIAGNOSIS — G8929 Other chronic pain: Secondary | ICD-10-CM

## 2014-09-20 DIAGNOSIS — E1159 Type 2 diabetes mellitus with other circulatory complications: Secondary | ICD-10-CM | POA: Insufficient documentation

## 2014-09-20 DIAGNOSIS — E1121 Type 2 diabetes mellitus with diabetic nephropathy: Secondary | ICD-10-CM

## 2014-09-20 HISTORY — DX: Other amnesia: R41.3

## 2014-09-20 HISTORY — DX: Type 2 diabetes mellitus with diabetic polyneuropathy: E11.42

## 2014-09-20 HISTORY — DX: Low back pain, unspecified: M54.50

## 2014-09-20 MED ORDER — CLONAZEPAM 0.5 MG PO TABS: 0.5000 mg | ORAL_TABLET | Freq: Two times a day (BID) | ORAL | Status: DC

## 2014-09-20 MED ORDER — GABAPENTIN 300 MG PO CAPS: ORAL_CAPSULE | ORAL | Status: DC

## 2014-09-20 NOTE — Patient Instructions (Signed)
Back Pain, Adult Low back pain is very common. About 1 in 5 people have back pain.The cause of low back pain is rarely dangerous. The pain often gets better over time.About half of people with a sudden onset of back pain feel better in just 2 weeks. About 8 in 10 people feel better by 6 weeks.  CAUSES Some common causes of back pain include:  Strain of the muscles or ligaments supporting the spine.  Wear and tear (degeneration) of the spinal discs.  Arthritis.  Direct injury to the back. DIAGNOSIS Most of the time, the direct cause of low back pain is not known.However, back pain can be treated effectively even when the exact cause of the pain is unknown.Answering your caregiver's questions about your overall health and symptoms is one of the most accurate ways to make sure the cause of your pain is not dangerous. If your caregiver needs more information, he or she may order lab work or imaging tests (X-rays or MRIs).However, even if imaging tests show changes in your back, this usually does not require surgery. HOME CARE INSTRUCTIONS For many people, back pain returns.Since low back pain is rarely dangerous, it is often a condition that people can learn to manageon their own.   Remain active. It is stressful on the back to sit or stand in one place. Do not sit, drive, or stand in one place for more than 30 minutes at a time. Take short walks on level surfaces as soon as pain allows.Try to increase the length of time you walk each day.  Do not stay in bed.Resting more than 1 or 2 days can delay your recovery.  Do not avoid exercise or work.Your body is made to move.It is not dangerous to be active, even though your back may hurt.Your back will likely heal faster if you return to being active before your pain is gone.  Pay attention to your body when you bend and lift. Many people have less discomfortwhen lifting if they bend their knees, keep the load close to their bodies,and  avoid twisting. Often, the most comfortable positions are those that put less stress on your recovering back.  Find a comfortable position to sleep. Use a firm mattress and lie on your side with your knees slightly bent. If you lie on your back, put a pillow under your knees.  Only take over-the-counter or prescription medicines as directed by your caregiver. Over-the-counter medicines to reduce pain and inflammation are often the most helpful.Your caregiver may prescribe muscle relaxant drugs.These medicines help dull your pain so you can more quickly return to your normal activities and healthy exercise.  Put ice on the injured area.  Put ice in a plastic bag.  Place a towel between your skin and the bag.  Leave the ice on for 15-20 minutes, 03-04 times a day for the first 2 to 3 days. After that, ice and heat may be alternated to reduce pain and spasms.  Ask your caregiver about trying back exercises and gentle massage. This may be of some benefit.  Avoid feeling anxious or stressed.Stress increases muscle tension and can worsen back pain.It is important to recognize when you are anxious or stressed and learn ways to manage it.Exercise is a great option. SEEK MEDICAL CARE IF:  You have pain that is not relieved with rest or medicine.  You have pain that does not improve in 1 week.  You have new symptoms.  You are generally not feeling well. SEEK   IMMEDIATE MEDICAL CARE IF:   You have pain that radiates from your back into your legs.  You develop new bowel or bladder control problems.  You have unusual weakness or numbness in your arms or legs.  You develop nausea or vomiting.  You develop abdominal pain.  You feel faint. Document Released: 04/01/2005 Document Revised: 10/01/2011 Document Reviewed: 08/03/2013 Exit[employer] Patient Information 2015 [employer]. This information is not intended to replace advice given to you by your health care provider. Make sure you  discuss any questions you have with your health care provider.  

## 2014-09-20 NOTE — Progress Notes (Signed)
Reason for visit: Restless leg syndrome  Norma [name] is an 64 y.o. female  History of present illness:  Norma [name] is a 64 year old right-handed white female with a history of morbid obesity and a multitude of associated disorders such as diabetes, chronic low back pain, peripheral neuropathy, restless leg syndrome, obstructive sleep apnea, and a gait disorder. The patient also has a history of essential tremor. The tremor is relatively mild, the patient will have good days and bad days with the tremor, occasionally she will have difficulty with handwriting. The patient has had ongoing low back pain, some pain down the left leg. MRI of the low back has been done recently questioning some impingement of the left L4 nerve root. The patient has a peripheral neuropathy with numbness that she reports up to the knees bilaterally, associated with some balance issues without associated falls. The patient has had some gradual worsening of problems with memory, increased fatigue. The patient has obstructive sleep apnea on CPAP, but she indicates that her CPAP has not been reevaluated in quite some time. The patient does have restless legs in the evening hours associated some increased pain from her peripheral neuropathy. The patient is on gabapentin taking 300 mg twice during the day and 600 mg at night. She returns for further evaluation.  Past Medical History  Diagnosis Date  . Anemia   . GERD (gastroesophageal reflux disease)   . Hypertension   . Diabetes mellitus   . Morbid obesity   . Dyslipidemia   . Chronic daily headache   . Depression   . IBS (irritable bowel syndrome)   . Obstructive sleep apnea on CPAP   . Gastroparesis   . Bipolar disorder   . Hypothyroidism   . Restless legs syndrome (RLS) 09/17/2012  . Degenerative arthritis   . COPD (chronic obstructive pulmonary disease)   . Bulging lumbar disc     L3-4  . Neuropathy   . DM type 2 with diabetic peripheral neuropathy 09/20/2014   . Chronic low back pain 09/20/2014  . Memory difficulty 09/20/2014    Past Surgical History  Procedure Laterality Date  . Cholecystectomy    . Abdominal hysterectomy    . Lesions      Removed from tongue  . Rectal surgery      fissure  . Arthroscopy knee w/ drilling  06/2011    and Decemer of 2013.  [name] Shoulder surgery      arthroscopy in March of this year    Family History  Problem Relation Age of Onset  . Hypertension Mother   . Lymphoma Mother   . Arthritis Father   . Hypertension Sister   . Cancer Brother     kidney and lung    Social history:  reports that she quit smoking about 43 years ago. Her smoking use included Cigarettes. She has never used smokeless tobacco. She reports that she does not drink alcohol or use illicit drugs.    Allergies  Allergen Reactions  . Amoxicillin Anaphylaxis  . Hydrocodone Anaphylaxis  . Primidone Anaphylaxis  . Aspirin   . Depacon [Valproate Sodium]     Causes falls   . Lisinopril     Medications:  Prior to Admission medications   Medication Sig Start Date End Date Taking? Authorizing Provider  aspirin-acetaminophen-caffeine (EXCEDRIN MIGRAINE) [phone] MG per tablet Take 2 tablets by mouth every 6 (six) hours as needed for headache.    Historical Provider, MD  Biotin 5000 MCG CAPS  Take 1 capsule by mouth at bedtime.    Historical Provider, MD  buPROPion (WELLBUTRIN XL) 150 MG 24 hr tablet Take 150 mg by mouth at bedtime.     Historical Provider, MD  clonazePAM (KLONOPIN) 0.5 MG tablet Take 1 tablet (0.5 mg total) by mouth 2 (two) times daily. 04/11/14   Kathrynn Ducking, MD  Dulaglutide (TRULICITY) 6.76 PP/5.0DT SOPN Inject 0.75 mg into the skin every 7 (seven) days.     Historical Provider, MD  EPINEPHrine (EPIPEN IJ) Inject as directed once. As needed    Historical Provider, MD  gabapentin (NEURONTIN) 100 MG capsule Take 200 mg by mouth 2 (two) times daily.    Historical Provider, MD  glyBURIDE (DIABETA) 2.5 MG tablet Take 5  mg by mouth 2 (two) times daily with a meal.     Historical Provider, MD  hydrOXYzine (ATARAX/VISTARIL) 25 MG tablet Take 25 mg by mouth at bedtime as needed for anxiety or itching.     Historical Provider, MD  lamoTRIgine (LAMICTAL) 150 MG tablet Take 150 mg by mouth at bedtime.     Historical Provider, MD  levothyroxine (SYNTHROID, LEVOTHROID) 100 MCG tablet Take 100 mcg by mouth at bedtime.     Historical Provider, MD  losartan (COZAAR) 50 MG tablet Take 50 mg by mouth at bedtime.     Historical Provider, MD  meclizine (ANTIVERT) 25 MG tablet Take 1 tablet (25 mg total) by mouth 3 (three) times daily as needed for dizziness. 03/29/14   Virgel Manifold, MD  MELATONIN PO Take 1 tablet by mouth at bedtime as needed (for sleep).    Historical Provider, MD  metFORMIN (GLUCOPHAGE) 500 MG tablet Take 500 mg by mouth 2 (two) times daily.     Historical Provider, MD  nystatin-triamcinolone ointment (MYCOLOG) APPLY TWICE A DAY Patient not taking: Reported on 03/29/2014 02/05/12   Rogene Houston, MD  omeprazole (PRILOSEC) 40 MG capsule Take 1 capsule (40 mg total) by mouth daily. 03/17/14   Butch Penny, NP  pravastatin (PRAVACHOL) 20 MG tablet Take 20 mg by mouth daily. 03/08/14   Historical Provider, MD  promethazine (PHENERGAN) 25 MG tablet Take 12.5-25 mg by mouth every 6 (six) hours as needed. 1/2 tablet twice daily as needed    Historical Provider, MD  traMADol (ULTRAM) 50 MG tablet Take 2 tablets (100 mg total) by mouth every 8 (eight) hours as needed for pain. 12/25/12   Charlett Blake, MD  venlafaxine XR (EFFEXOR-XR) 150 MG 24 hr capsule Take 300 mg by mouth at bedtime. 02/21/14   Historical Provider, MD    ROS:  Out of a complete 14 system review of symptoms, the patient complains only of the following symptoms, and all other reviewed systems are negative.  Hearing loss, ringing in the ears Shortness of breath Chest pain, leg swelling, heart murmur Eye itching Heat intolerance, excessive  thirst Nausea Restless legs, sleep apnea, daytime sleepiness, snoring Environmental allergies, food allergies Incontinence of the bladder, urinary urgency Joint pain, back pain, achy muscles, muscle cramps, walking difficulty Dizziness, headache, numbness, weakness, tremors  Blood pressure 138/74, pulse 84, height 5\' 4"  (1.626 m), weight 285 lb 6.4 oz (129.457 kg).  Physical Exam  General: The patient is alert and cooperative at the time of the examination. The patient is morbidly obese.  Skin: 2+ edema at the ankles is noted bilaterally.   Neurologic Exam  Mental status: The patient is alert and oriented x 3 at the time of the  examination. The patient has apparent normal recent and remote memory, with an apparently normal attention span and concentration ability.   Cranial nerves: Facial symmetry is present. Speech is normal, no aphasia or dysarthria is noted. Extraocular movements are full. Visual fields are full.  Motor: The patient has good strength in all 4 extremities.  Sensory examination: Soft touch sensation is symmetric on the face, arms, and legs.  Coordination: The patient has good finger-nose-finger and heel-to-shin bilaterally. No significant tremors are noted today.  Gait and station: The patient has a normal gait. Tandem gait is unsteady. Romberg is negative. No drift is seen.  Reflexes: Deep tendon reflexes are symmetric, but are depressed.   MRI lumbar 06/10/14:  IMPRESSION: 1. Shallow extra foraminal disc protrusion on the left at L4-5 could potentially irritate the left L4 nerve root. 2. Small annular fissures at L1-2 and L5-S1. 3. Mild bilateral lateral recess encroachment at L2-3.    Assessment/Plan:  1. Essential tremor  2. Morbid obesity  3. Chronic back pain, left leg pain  4. Diabetes  5. Diabetic peripheral neuropathy  6. Restless leg syndrome  7. Reported memory disturbance  The patient will be sent for blood work today to  evaluate the memory issues. In the past, the patient has had low normal vitamin B12 levels. The patient may require MRI or CT examination of the brain. She will continue the clonazepam, a prescription was given today. She will follow-up in 6 months. The patient will need to have the CPAP settings reviewed in the near future, as she reports some increased excessive daytime drowsiness and fatigue paralleling the memory issue.  Jill Alexanders MD 09/20/2014 7:40 PM  [facility] [address] [place], [place] [address]  Phone [phone] Fax [phone]

## 2014-09-22 ENCOUNTER — Telehealth: Payer: Self-pay

## 2014-09-22 LAB — COPPER, SERUM: COPPER: 105 ug/dL (ref 72–166)

## 2014-09-22 LAB — METHYLMALONIC ACID, SERUM: METHYLMALONIC ACID: 164 nmol/L (ref 0–378)

## 2014-09-22 LAB — VITAMIN B12: Vitamin B-12: 240 pg/mL (ref 211–946)

## 2014-09-22 LAB — RPR: RPR Ser Ql: NONREACTIVE

## 2014-09-22 NOTE — Telephone Encounter (Signed)
I tried to call the patient. Unable to leave a message. Her mailbox is full.

## 2014-09-22 NOTE — Telephone Encounter (Signed)
-----   Message from Kathrynn Ducking, MD sent at 09/22/2014  3:30 PM EDT -----  The blood work results are unremarkable. Please call the patient.  ----- Message -----    From: [facility] Lab Results In Interface    Sent: 09/21/2014   7:45 AM      To: Kathrynn Ducking, MD

## 2014-09-23 NOTE — Telephone Encounter (Signed)
I called the patient and relayed results. 

## 2014-10-21 ENCOUNTER — Other Ambulatory Visit: Payer: Self-pay | Admitting: Neurology

## 2014-10-24 ENCOUNTER — Telehealth: Payer: Self-pay | Admitting: Neurology

## 2014-10-24 NOTE — Telephone Encounter (Signed)
Norma [name] from  [place], [place], [place], Called stating that they have not received a faxed Rx for clonazePAM (KLONOPIN) 0.5 MG tablet. Would like it faxed back if possible.

## 2014-10-24 NOTE — Telephone Encounter (Signed)
Rx has been resent 

## 2014-11-29 ENCOUNTER — Ambulatory Visit (INDEPENDENT_AMBULATORY_CARE_PROVIDER_SITE_OTHER): Payer: PRIVATE HEALTH INSURANCE | Admitting: Internal Medicine

## 2014-12-09 ENCOUNTER — Other Ambulatory Visit (INDEPENDENT_AMBULATORY_CARE_PROVIDER_SITE_OTHER): Payer: Self-pay | Admitting: Internal Medicine

## 2014-12-16 LAB — HM DIABETES EYE EXAM

## 2015-02-06 ENCOUNTER — Ambulatory Visit: Payer: Medicare Other | Admitting: "Endocrinology

## 2015-02-07 ENCOUNTER — Ambulatory Visit (INDEPENDENT_AMBULATORY_CARE_PROVIDER_SITE_OTHER): Payer: PRIVATE HEALTH INSURANCE | Admitting: Internal Medicine

## 2015-02-08 LAB — HEMOGLOBIN A1C: Hgb A1c MFr Bld: 8.3 % — AB (ref 4.0–6.0)

## 2015-02-14 ENCOUNTER — Encounter (INDEPENDENT_AMBULATORY_CARE_PROVIDER_SITE_OTHER): Payer: Self-pay | Admitting: *Deleted

## 2015-02-15 ENCOUNTER — Encounter: Payer: Self-pay | Admitting: "Endocrinology

## 2015-02-15 ENCOUNTER — Ambulatory Visit (INDEPENDENT_AMBULATORY_CARE_PROVIDER_SITE_OTHER): Payer: Medicare Other | Admitting: "Endocrinology

## 2015-02-15 VITALS — BP 129/73 | HR 96 | Ht 61.0 in | Wt 262.0 lb

## 2015-02-15 DIAGNOSIS — E1165 Type 2 diabetes mellitus with hyperglycemia: Secondary | ICD-10-CM

## 2015-02-15 DIAGNOSIS — E785 Hyperlipidemia, unspecified: Secondary | ICD-10-CM

## 2015-02-15 DIAGNOSIS — IMO0002 Reserved for concepts with insufficient information to code with codable children: Secondary | ICD-10-CM

## 2015-02-15 DIAGNOSIS — I10 Essential (primary) hypertension: Secondary | ICD-10-CM | POA: Diagnosis not present

## 2015-02-15 DIAGNOSIS — E118 Type 2 diabetes mellitus with unspecified complications: Secondary | ICD-10-CM

## 2015-02-15 DIAGNOSIS — E039 Hypothyroidism, unspecified: Secondary | ICD-10-CM

## 2015-02-15 MED ORDER — METFORMIN HCL 500 MG PO TABS: 1000.0000 mg | ORAL_TABLET | Freq: Two times a day (BID) | ORAL | Status: DC

## 2015-02-15 NOTE — Progress Notes (Signed)
Subjective:    Patient ID: Norma [name], female    DOB: 26-Sep-1950,    Past Medical History  Diagnosis Date  . Anemia   . GERD (gastroesophageal reflux disease)   . Hypertension   . Diabetes mellitus   . Morbid obesity ([place])   . Dyslipidemia   . Chronic daily headache   . Depression   . IBS (irritable bowel syndrome)   . Obstructive sleep apnea on CPAP   . Gastroparesis   . Bipolar disorder ([place])   . Hypothyroidism   . Restless legs syndrome (RLS) 09/17/2012  . Degenerative arthritis   . COPD (chronic obstructive pulmonary disease) ([place])   . Bulging lumbar disc     L3-4  . Neuropathy ([place])   . DM type 2 with diabetic peripheral neuropathy ([place]) 09/20/2014  . Chronic low back pain 09/20/2014  . Memory difficulty 09/20/2014   Past Surgical History  Procedure Laterality Date  . Cholecystectomy    . Abdominal hysterectomy    . Lesions      Removed from tongue  . Rectal surgery      fissure  . Arthroscopy knee w/ drilling  06/2011    and Decemer of 2013.  [name] Shoulder surgery      arthroscopy in March of this year   Social History   Social History  . Marital Status: Single    Spouse Name: N/A  . Number of Children: 0  . Years of Education: 16   Occupational History  .     Social History Main Topics  . Smoking status: Former Smoker    Types: Cigarettes    Quit date: 02/04/1971  . Smokeless tobacco: Never Used     Comment: smoked 2 cigarettes a day  . Alcohol Use: No  . Drug Use: No  . Sexual Activity: Not Asked   Other Topics Concern  . None   Social History Narrative   Patient lives at home alone.    Patient has no children.    Patient has her masters in nursing.    Patient is single.    Patient drinks about 2 glasses of tea daily.   Patient is right handed.   Outpatient Encounter Prescriptions as of 02/15/2015  Medication Sig  . aspirin-acetaminophen-caffeine (EXCEDRIN MIGRAINE) [phone] MG per tablet Take 2 tablets by mouth every 6 (six) hours  as needed for headache.  . Biotin 5000 MCG CAPS Take 1 capsule by mouth at bedtime.  [name] buPROPion (WELLBUTRIN XL) 150 MG 24 hr tablet Take 150 mg by mouth at bedtime.   . canagliflozin (INVOKANA) 100 MG TABS tablet Take 200 mg by mouth daily.  . clonazePAM (KLONOPIN) 0.5 MG tablet Take 1 tablet (0.5 mg total) by mouth 2 (two) times daily.  [name] gabapentin (NEURONTIN) 300 MG capsule One capsule twice a day and 2 capsules at night  . lamoTRIgine (LAMICTAL) 150 MG tablet Take 150 mg by mouth at bedtime.   [name] levothyroxine (SYNTHROID, LEVOTHROID) 100 MCG tablet Take 100 mcg by mouth at bedtime.   [name] losartan (COZAAR) 50 MG tablet Take 50 mg by mouth at bedtime.   . metFORMIN (GLUCOPHAGE) 500 MG tablet Take 2 tablets (1,000 mg total) by mouth 2 (two) times daily with a meal.  . omeprazole (PRILOSEC) 40 MG capsule Take 1 capsule (40 mg total) by mouth daily.  . pravastatin (PRAVACHOL) 20 MG tablet Take 20 mg by mouth daily.  . traMADol (ULTRAM) 50 MG tablet Take 2 tablets (  100 mg total) by mouth every 8 (eight) hours as needed for pain.  [name] venlafaxine XR (EFFEXOR-XR) 150 MG 24 hr capsule Take 300 mg by mouth at bedtime.  . [DISCONTINUED] metFORMIN (GLUCOPHAGE) 500 MG tablet Take 1,000 mg by mouth 2 (two) times daily.   . Dulaglutide (TRULICITY) 3.49 ZP/9.1TA SOPN Inject 0.75 mg into the skin every 7 (seven) days.   [name] EPINEPHrine (EPIPEN IJ) Inject as directed once. As needed  . hydrOXYzine (ATARAX/VISTARIL) 25 MG tablet Take 25 mg by mouth at bedtime as needed for anxiety or itching.   . meclizine (ANTIVERT) 25 MG tablet Take 1 tablet (25 mg total) by mouth 3 (three) times daily as needed for dizziness.  [name] MELATONIN PO Take 1 tablet by mouth at bedtime as needed (for sleep).  . nystatin-triamcinolone ointment (MYCOLOG) APPLY TWICE DAILY  . promethazine (PHENERGAN) 25 MG tablet Take 12.5-25 mg by mouth every 6 (six) hours as needed. 1/2 tablet twice daily as needed  . [DISCONTINUED] glipiZIDE (GLUCOTROL) 5  MG tablet Take 5 mg by mouth 2 (two) times daily before a meal.   No facility-administered encounter medications on file as of 02/15/2015.   ALLERGIES: Allergies  Allergen Reactions  . Amoxicillin Anaphylaxis  . Hydrocodone Anaphylaxis  . Primidone Anaphylaxis  . Aspirin   . Depacon [Valproate Sodium]     Causes falls   . Lisinopril    VACCINATION STATUS:  There is no immunization history on file for this patient.  Diabetes She presents for her follow-up diabetic visit. She has type 2 diabetes mellitus. Onset time: She was diagnosed at approximate age of 65 years. Her disease course has been improving. There are no hypoglycemic associated symptoms. Pertinent negatives for hypoglycemia include no confusion, headaches, pallor or seizures. Associated symptoms include fatigue, polydipsia and polyuria. Pertinent negatives for diabetes include no chest pain and no polyphagia. There are no hypoglycemic complications. Symptoms are improving. There are no diabetic complications. Risk factors for coronary artery disease include diabetes mellitus, dyslipidemia, hypertension, obesity and sedentary lifestyle. Current diabetic treatment includes oral agent (dual therapy). Her weight is stable. She is following a generally unhealthy diet. She has not had a previous visit with a dietitian. She never participates in exercise. Home blood sugar record trend: She is not monitoring blood glucose. An ACE inhibitor/angiotensin II receptor blocker is being taken. Eye exam is current.  Hypertension This is a chronic problem. The current episode started more than 1 year ago. Pertinent negatives include no chest pain, headaches, palpitations or shortness of breath. Past treatments include ACE inhibitors. Compliance problems include diet, exercise, medication cost, medication side effects and psychosocial issues.  Hypertensive end-organ damage includes a thyroid problem.  Hyperlipidemia This is a chronic problem. The  current episode started more than 1 year ago. Pertinent negatives include no chest pain, myalgias or shortness of breath. Current antihyperlipidemic treatment includes statins. Risk factors for coronary artery disease include a sedentary lifestyle, obesity, hypertension, diabetes mellitus and dyslipidemia.  Thyroid Problem Presents for follow-up visit. Symptoms include fatigue. Patient reports no cold intolerance, diarrhea, heat intolerance or palpitations. The symptoms have been stable. Past treatments include levothyroxine. Her past medical history is significant for hyperlipidemia.     Review of Systems  Constitutional: Positive for fatigue. Negative for unexpected weight change.  HENT: Negative for trouble swallowing and voice change.   Eyes: Negative for visual disturbance.  Respiratory: Negative for cough, shortness of breath and wheezing.   Cardiovascular: Negative for chest pain, palpitations and leg  swelling.  Gastrointestinal: Negative for nausea, vomiting and diarrhea.  Endocrine: Positive for polydipsia and polyuria. Negative for cold intolerance, heat intolerance and polyphagia.  Musculoskeletal: Negative for myalgias and arthralgias.  Skin: Negative for color change, pallor, rash and wound.  Neurological: Negative for seizures and headaches.  Psychiatric/Behavioral: Negative for suicidal ideas and confusion.    Objective:    BP 129/73 mmHg  Pulse 96  Ht 5\' 1"  (1.549 m)  Wt 262 lb (118.842 kg)  BMI 49.53 kg/m2  SpO2 97%  Wt Readings from Last 3 Encounters:  02/15/15 262 lb (118.842 kg)  09/20/14 285 lb 6.4 oz (129.457 kg)  06/10/14 275 lb (124.739 kg)    Physical Exam  Constitutional: She is oriented to person, place, and time. She appears well-developed.  HENT:  Head: Normocephalic and atraumatic.  Eyes: EOM are normal.  Neck: Normal range of motion. Neck supple. No tracheal deviation present. No thyromegaly present.  Cardiovascular: Normal rate and regular  rhythm.   Pulmonary/Chest: Effort normal and breath sounds normal.  Abdominal: Soft. Bowel sounds are normal. There is no tenderness. There is no guarding.  Musculoskeletal: Normal range of motion. She exhibits no edema.  Neurological: She is alert and oriented to person, place, and time. She has normal reflexes. No cranial nerve deficit. Coordination normal.  Skin: Skin is warm and dry. No rash noted. No erythema. No pallor.  Psychiatric: She has a normal mood and affect. Judgment normal.    Results for orders placed or performed in visit on 02/15/15  Hemoglobin A1c  Result Value Ref Range   Hgb A1c MFr Bld 8.3 (A) 4.0 - 6.0 %  HM DIABETES EYE EXAM  Result Value Ref Range   HM Diabetic Eye Exam No Retinopathy No Retinopathy   Complete Blood Count (Most recent): No results found for: WBC, HGB, HCT, MCV, PLT Chemistry (most recent): Lab Results  Component Value Date   CREATININE 1.10 02/05/2011   Diabetic Labs (most recent): Lab Results  Component Value Date   HGBA1C 8.3* 02/08/2015   Lipid profile (most recent): No results found for: TRIG, CHOL       Assessment & Plan:   1. Uncontrolled type 2 diabetes mellitus with complication, without long-term current use of insulin (HCC)  Her diabetes is  complicated by neuropathy and patient remains at a high risk for more acute and chronic complications of diabetes which include CAD, CVA, CKD, retinopathy, and neuropathy. These are all discussed in detail with the patient.  Patient came with stable  A1c of 8.3 %, she took metformin only once a day despite my advice to take it twice a day.   Recent labs reviewed.   - I have re-counseled the patient on diet management and weight loss  by adopting a carbohydrate restricted / protein rich  Diet.  - Suggestion is made for patient to avoid simple carbohydrates   from their diet including Cakes , Desserts, Ice Cream,  Soda (  diet and regular) , Sweet Tea , Candies,  Chips, Cookies,  Artificial Sweeteners,   and "Sugar-free" Products .  This will help patient to have stable blood glucose profile and potentially avoid unintended  Weight gain.  - Patient is advised to stick to a routine mealtimes to eat 3 meals  a day and avoid unnecessary snacks ( to snack only to correct hypoglycemia).  - The patient  will be  scheduled with Jearld Fenton, RDN, CDE for individualized DM education.  - I have approached patient  with the following individualized plan to manage diabetes and patient agrees.  -For better insulin sensitivity I will continue MTF 1gm PO BID, continue Invokana 100mg  po qam.   - Patient will be considered for incretin therapy as appropriate next visit. - Patient specific target  for A1c; LDL, HDL, Triglycerides, and  Waist Circumference were discussed in detail.  2) BP/HTN: Controlled. Continue current medications including ACEI/ARB. 3) Lipids/HPL:  continue statins. 4)  Weight/Diet: CDE consult in progress, exercise, and carbohydrates information provided.  5) Hypothyroidism, unspecified hypothyroidism type  She is euthyroid. I advised her to continue levothyroxine 100 g by mouth every morning.  - We discussed about correct intake of levothyroxine, at fasting, with water, separated by at least 30 minutes from breakfast, and separated by more than 4 hours from calcium, iron, multivitamins, acid reflux medications (PPIs). -Patient is made aware of the fact that thyroid hormone replacement is needed for life, dose to be adjusted by periodic monitoring of thyroid function tests.  6) Chronic Care/Health Maintenance:  -Patient is on ACEI/ARB and Statin medications and encouraged to continue to follow up with Ophthalmology, Podiatrist at least yearly or according to recommendations, and advised to  stay away from smoking. I have recommended yearly flu vaccine and pneumonia vaccination at least every 5 years; moderate intensity exercise for up to 150 minutes weekly; and   sleep for at least 7 hours a day.  I advised patient to maintain close follow up with their PCP for primary care needs.  Patient is asked to bring meter and  blood glucose logs during their next visit.   Follow up plan: Return in about 3 months (around 05/18/2015) for diabetes, high blood pressure, high cholesterol, underactive thyroid.  Glade Lloyd, MD Phone: [phone]  Fax: [phone]   02/15/2015, 8:47 PM

## 2015-02-15 NOTE — Patient Instructions (Signed)

## 2015-03-06 ENCOUNTER — Ambulatory Visit (INDEPENDENT_AMBULATORY_CARE_PROVIDER_SITE_OTHER): Payer: PRIVATE HEALTH INSURANCE | Admitting: Internal Medicine

## 2015-03-22 ENCOUNTER — Ambulatory Visit: Payer: Medicare Other | Admitting: Neurology

## 2015-03-23 ENCOUNTER — Telehealth (INDEPENDENT_AMBULATORY_CARE_PROVIDER_SITE_OTHER): Payer: Self-pay | Admitting: *Deleted

## 2015-03-23 NOTE — Telephone Encounter (Signed)
Patient states that she knows that she has missed the last 3 appointments. She would like to see if there is an appointment sooner than May for her to see Dr.Rehman.  I did not see where she had been put in recall for May.

## 2015-03-23 NOTE — Telephone Encounter (Signed)
appt sch'd 05/08/14 at 130, patient aware

## 2015-03-24 ENCOUNTER — Encounter: Payer: PRIVATE HEALTH INSURANCE | Attending: "Endocrinology | Admitting: Nutrition

## 2015-03-24 ENCOUNTER — Encounter: Payer: Self-pay | Admitting: Nutrition

## 2015-03-24 VITALS — Ht 63.75 in | Wt 264.4 lb

## 2015-03-24 DIAGNOSIS — Z7984 Long term (current) use of oral hypoglycemic drugs: Secondary | ICD-10-CM | POA: Diagnosis not present

## 2015-03-24 DIAGNOSIS — IMO0002 Reserved for concepts with insufficient information to code with codable children: Secondary | ICD-10-CM

## 2015-03-24 DIAGNOSIS — E119 Type 2 diabetes mellitus without complications: Secondary | ICD-10-CM | POA: Insufficient documentation

## 2015-03-24 DIAGNOSIS — Z713 Dietary counseling and surveillance: Secondary | ICD-10-CM | POA: Diagnosis present

## 2015-03-24 DIAGNOSIS — E1165 Type 2 diabetes mellitus with hyperglycemia: Secondary | ICD-10-CM

## 2015-03-24 DIAGNOSIS — E118 Type 2 diabetes mellitus with unspecified complications: Secondary | ICD-10-CM

## 2015-03-24 NOTE — Patient Instructions (Signed)
Goals 1. Follow the Plate Method 2. Will try to eat lunch and not skip meals. 3. Eat breakfast at home  4. Work on cutting out of sweet tea. 5. INcrease water intake at work. 6. Get A1C down to 7.5% in three months. 7.  Test blood sugars in am daily.

## 2015-03-24 NOTE — Progress Notes (Signed)
  Medical Nutrition Therapy:  Appt start time: 1100 end time:  1200.  Assessment:  Primary concerns today: Diabetes Type 2. A1C 8.3%.. Lives by herself. Most meals eaten out.  Fast food and restaurants/diners. Not testing blood sugars very often. Metformin 1000 mg BID and Invocanna  200 mg a day. Physical activity; ADL Has back and leg problems and has limited mobility and can't exercise.[name] Hasn't met with a RD before.  Doesn't like white milk. Works part time. On disability. Works for [employer] to make changes needed to improve her blood sugars. Eats 2-3 meals per day. Drinks sweet tea and not a lot of water. Diet is high in fat, sodium, carbs and low in fresh fruits and vegetables. She has a meter and willing to start testing.  Lab Results  Component Value Date   HGBA1C 8.3* 02/08/2015    Preferred Learning Style:    No preference indicated   Learning Readiness:   Ready  Change in progress   MEDICATIONS: See list.   DIETARY INTAKE:   24-hr recall:  B ( AM): Fried egg biscuit with sweet tea from [place] ( AM):   L ( PM): Skips. Or onion rings, water Snk ( PM):  D ( PM): Cube steak and gravy, mashed potatoes and okra, water Snk ( PM):  [place] and ritz crackers, Beverages: Tea large glass iin am: water or chocolate milk at times.  Usual physical activity: ADL  Estimated energy needs: 1200 calories 135 g carbohydrates 90 g protein 33 g fat  Progress Towards Goal(s):  In progress.   Nutritional Diagnosis:  NB-1.1 Food and nutrition-related knowledge deficit As related to DM.  As evidenced by A1C 8.3%..    Intervention:  Nutrition and Diabetes education provided on My Plate, CHO counting, meal planning, portion sizes, timing of meals, avoiding snacks between meals unless having a low blood sugar, target ranges for A1C and blood sugars, signs/symptoms and treatment of hyper/hypoglycemia, monitoring blood sugars, taking medications as  prescribed, benefits of exercising 30 minutes per day and prevention of complications of DM.  Goals 1. Follow the Plate Method 2. Will try to eat lunch and not skip meals. 3. Eat breakfast at home  4. Work on cutting out of sweet tea. 5. INcrease water intake at work. 6. Get A1C down to 7.5% in three months. 7.  Test blood sugars in am daily.  Teaching Method Utilized:  Visual Auditory Hands on  Handouts given during visit include:  The Plate Method  Meal Plan Card  Diabetes Instructions  Barriers to learning/adherence to lifestyle change: Back and leg pain  Demonstrated degree of understanding via:  Teach Back   Monitoring/Evaluation:  Dietary intake, exercise, meal planning, SBG, and body weight in 1 month(s).

## 2015-04-06 ENCOUNTER — Other Ambulatory Visit (INDEPENDENT_AMBULATORY_CARE_PROVIDER_SITE_OTHER): Payer: Self-pay | Admitting: Internal Medicine

## 2015-04-20 ENCOUNTER — Other Ambulatory Visit: Payer: Self-pay | Admitting: "Endocrinology

## 2015-04-20 ENCOUNTER — Other Ambulatory Visit: Payer: Self-pay | Admitting: Neurology

## 2015-05-03 ENCOUNTER — Encounter: Payer: Self-pay | Admitting: Nutrition

## 2015-05-03 ENCOUNTER — Encounter: Payer: PPO | Attending: "Endocrinology | Admitting: Nutrition

## 2015-05-03 VITALS — Ht 63.75 in | Wt 262.2 lb

## 2015-05-03 DIAGNOSIS — Z7984 Long term (current) use of oral hypoglycemic drugs: Secondary | ICD-10-CM | POA: Diagnosis not present

## 2015-05-03 DIAGNOSIS — E119 Type 2 diabetes mellitus without complications: Secondary | ICD-10-CM | POA: Diagnosis not present

## 2015-05-03 DIAGNOSIS — Z713 Dietary counseling and surveillance: Secondary | ICD-10-CM | POA: Insufficient documentation

## 2015-05-03 DIAGNOSIS — E118 Type 2 diabetes mellitus with unspecified complications: Secondary | ICD-10-CM

## 2015-05-03 DIAGNOSIS — E1165 Type 2 diabetes mellitus with hyperglycemia: Secondary | ICD-10-CM

## 2015-05-03 NOTE — Patient Instructions (Signed)
Goals: 1. Follow Plate Method 2. Increase low carb vegetables to 4 serving per day/ 3. Substitute light yogurt for ice cream 4. Choose high protein cereal bar for breakfast. 5. Don't skp meals and avoid snacks between meals. 6. Get A1C down to 7.5% in three months. 7. Check blood sugars at night before bed daily.

## 2015-05-03 NOTE — Progress Notes (Signed)
  Medical Nutrition Therapy:  Appt start time: 1000 end time:  1030  Assessment:  Primary concerns today: Diabetes Type 2. Last A1C 8.3%. Hasn't had a1c done recently. She will get done next week at Dr. Luan Pulling. PMH: Bipolar and on disability.  She lost 6 lbs since last visit. She notes she did well at first and then hasn't done as well recently. Downloaded meter. BS remain high >160 fasting. Only testing a few times per week and only in am.  Tends to sleep in and only eats 2 meals per day, skipping breakfast. Stays up at night at times and sleeps in often.  Still taking Metformin and Invokana. Couldn't take Trulicity due to cost. Not exercising due to chronic back and leg pain and arthritis. Will try chair exercises. Admits to having a weakness for ice cream. Diet is excessive in carbs and lacking fresh fruits, vegetables and whole grains.   Lab Results  Component Value Date   HGBA1C 8.3* 02/08/2015    Preferred Learning Style:    No preference indicated   Learning Readiness:   Ready  Change in progress   MEDICATIONS: See list.   DIETARY INTAKE:   24-hr recall:  B ( AM): skips usually  Due to sleeping in late. Snk ( AM):   L ( PM): Eggs, bacon, toast or hasbrowns or apple sauce Snk ( PM):  D ( PM): Cube steak and gravy, mashed potatoes and okra, water Snk ( PM):  Ice cream Beverages: water or Tea large glass iin am: water or chocolate milk at times.  Usual physical activity: ADL  Estimated energy needs: 1200 calories 135 g carbohydrates 90 g protein 33 g fat  Progress Towards Goal(s):  In progress.   Nutritional Diagnosis:  NB-1.1 Food and nutrition-related knowledge deficit As related to DM.  As evidenced by A1C 8.3%..    Intervention:  Nutrition and Diabetes education provided on My Plate, CHO counting, meal planning, portion sizes, timing of meals, avoiding snacks between meals unless having a low blood sugar, target ranges for A1C and blood sugars, signs/symptoms  and treatment of hyper/hypoglycemia, monitoring blood sugars, taking medications as prescribed, benefits of exercising 30 minutes per day and prevention of complications of DM.  Goals: 1. Follow Plate Method 2. Increase low carb vegetables to 4 serving per day/ 3. Substitute light yogurt for ice cream 4. Choose high protein cereal bar for breakfast. 5. Don't skp meals and avoid snacks between meals. 6. Get A1C down to 7.5% in three months. 7. Check blood sugars at night before bed daily.    Teaching Method Utilized:  Visual Auditory Hands on  Handouts given during visit include:  The Plate Method  Meal Plan Card  Diabetes Instructions  Barriers to learning/adherence to lifestyle change: Back and leg pain  Demonstrated degree of understanding via:  Teach Back   Monitoring/Evaluation:  Dietary intake, exercise, meal planning, SBG, and body weight in 3 month(s).

## 2015-05-09 ENCOUNTER — Encounter (INDEPENDENT_AMBULATORY_CARE_PROVIDER_SITE_OTHER): Payer: Self-pay | Admitting: Internal Medicine

## 2015-05-09 ENCOUNTER — Ambulatory Visit (INDEPENDENT_AMBULATORY_CARE_PROVIDER_SITE_OTHER): Payer: PPO | Admitting: Internal Medicine

## 2015-05-09 VITALS — BP 118/68 | HR 72 | Temp 97.9°F | Resp 18 | Ht 65.0 in | Wt 261.4 lb

## 2015-05-09 DIAGNOSIS — R1033 Periumbilical pain: Secondary | ICD-10-CM

## 2015-05-09 DIAGNOSIS — R110 Nausea: Secondary | ICD-10-CM

## 2015-05-09 DIAGNOSIS — K3184 Gastroparesis: Secondary | ICD-10-CM

## 2015-05-09 NOTE — Patient Instructions (Addendum)
Call office when you have domperidone prescription filled. Call if abdominal pain worsens.

## 2015-05-09 NOTE — Progress Notes (Signed)
Presenting complaint;  Nausea and midabdominal pain. History of GERD and gastroparesis.  Subjective:  Patient is 65 year old Caucasian female was here for scheduled visit. She was last seen in December 2015.  She has chronic GERD as well as gastroparesis and now presents with nausea virtually every night. She wakes up with nausea and mid left night. She has to take promethazine for relief. She has not experienced vomiting. She does not have nausea during the daytime. She rarely has heartburn. She also denies hoarseness so throat or chronic cough. She also complains of mid abdominal pain which started about a month ago. She states she was pain-free for several months after she took prednisone for allergic symptoms last year and also resulted in resolution of this pain. Her bowels move 2-3 times a day. Most of her stools are formed every now and then it slows. She denies melena or rectal bleeding. Her appetite is fair. She has lost 11 pounds in 13 months since she went on a diabetic diet. She feels some of her medications have also decreased her appetite. She is on gabapentin for per peripheral neuropathy. She is not able to do much walking on account of peripheral neuropathy.   Current Medications: Outpatient Encounter Prescriptions as of 05/09/2015  Medication Sig  . Biotin 5000 MCG CAPS Take 1 capsule by mouth at bedtime.  [name] buPROPion (WELLBUTRIN XL) 150 MG 24 hr tablet Take 150 mg by mouth at bedtime.   . canagliflozin (INVOKANA) 100 MG TABS tablet Take 200 mg by mouth daily.  . clonazePAM (KLONOPIN) 0.5 MG tablet TAKE ONE TABLET BY MOUTH TWICE DAILY.  [name] diclofenac sodium (VOLTAREN) 1 % GEL Apply topically as needed.  [name] EPINEPHrine (EPIPEN IJ) Inject as directed once. As needed  . gabapentin (NEURONTIN) 300 MG capsule One capsule twice a day and 2 capsules at night (Patient taking differently: Patient is taking 3 in the afternoon and 4 at night.)  . hydrOXYzine (ATARAX/VISTARIL) 25 MG tablet  Take 25 mg by mouth at bedtime as needed for anxiety or itching.   . lamoTRIgine (LAMICTAL) 150 MG tablet Take 150 mg by mouth at bedtime.   [name] levothyroxine (SYNTHROID, LEVOTHROID) 100 MCG tablet Take 100 mcg by mouth at bedtime.   [name] losartan (COZAAR) 50 MG tablet Take 50 mg by mouth at bedtime.   . meclizine (ANTIVERT) 25 MG tablet Take 1 tablet (25 mg total) by mouth 3 (three) times daily as needed for dizziness.  [name] MELATONIN PO Take 1 tablet by mouth at bedtime as needed (for sleep).  . metFORMIN (GLUCOPHAGE) 500 MG tablet Take 2 tablets (1,000 mg total) by mouth 2 (two) times daily with a meal.  . metroNIDAZOLE (METROGEL) 0.75 % gel Apply 1 application topically as needed.  . mupirocin ointment (BACTROBAN) 2 % APPLY AS DIRECTED TWICE DAILY  . nystatin-triamcinolone ointment (MYCOLOG) APPLY TWICE DAILY  . omeprazole (PRILOSEC) 40 MG capsule TAKE ONE CAPSULE BY MOUTH ONCE DAILY  . pravastatin (PRAVACHOL) 20 MG tablet Take 20 mg by mouth daily.  . promethazine (PHENERGAN) 25 MG tablet Take 12.5-25 mg by mouth every 6 (six) hours as needed. 1/2 tablet twice daily as needed  . traMADol (ULTRAM) 50 MG tablet Take 2 tablets (100 mg total) by mouth every 8 (eight) hours as needed for pain.  [name] venlafaxine XR (EFFEXOR-XR) 150 MG 24 hr capsule Take 300 mg by mouth at bedtime.  . [DISCONTINUED] aspirin-acetaminophen-caffeine (EXCEDRIN MIGRAINE) 250-250-65 MG per tablet Take 2 tablets by mouth every 6 (  six) hours as needed for headache. Reported on 05/09/2015  . [DISCONTINUED] Dulaglutide (TRULICITY) [id number] [id number] SOPN Inject 0.75 mg into the skin every 7 (seven) days. Reported on 05/09/2015   No facility-administered encounter medications on file as of 05/09/2015.    Objective: Blood pressure 118/68, pulse 72, temperature 97.9 F (36.6 C), temperature source Oral, resp. rate 18, height 5\' 5"  (1.651 m), weight 261 lb 6.4 oz (118.57 kg). Patient is alert and in no acute distress. Conjunctiva is pink.  Sclera is nonicteric Oropharyngeal mucosa is normal. No neck masses or thyromegaly noted. Cardiac exam with regular rhythm normal S1 and S2. No murmur or gallop noted. Lungs are clear to auscultation. Abdomen is obese. Superficial skin ulcers noted below the level of umbilicus one on either side of midline. Focal erythema noted to. Umbilicus skin. No drainage. Abdomen is soft and nontender without organomegaly or masses.  No LE edema or clubbing noted. She has very soft subcutaneous lesion close to left lateral malleolus consistent with lipoma. It is size of a small tangerine  Records reviewed:  Solid-phase gastric emptying study in January 2005 revealed T1 half of 167 minutes. She had normal EGD on 04/10/2010 She had 6 mm tubular adenoma removed from ascending colon on 04/10/2010.    Assessment:  #1. Nocturnal nausea most likely due to gastroparesis.  it is possible that her gastroparesis has worsened with her medications. She is not a candidate for metoclopramide. #2. GERD. Heartburn is well controlled with PPI. #3. Mid abdominal pain. She has had this pain off and on for a few years. Pain resolved after she was on prednisone for allergic problems. If pain gets worse will consider further evaluation and/or empiric therapy with prednisone. #4. History of colonic tubular adenoma. She had 6 mm tubular adenoma removed in December 2011. May consider colonoscopy in December 2018 which would be 7 years from her last exam.   Plan:  Continue anti-reflux measures. Continue Omeprazole at 20 mg by mouth twice a day for now. Domperidone 10 mg by mouth daily at bedtime. Patient informed that this is not FDA approved drug and she can get it from pharmacy in [place] or from overseas. She will call office when she has this prescription so that document start date. Patient will call if abdominal pain gets worse. Office visit in 6 months.

## 2015-05-12 ENCOUNTER — Other Ambulatory Visit: Payer: Self-pay | Admitting: "Endocrinology

## 2015-05-12 DIAGNOSIS — E039 Hypothyroidism, unspecified: Secondary | ICD-10-CM | POA: Diagnosis not present

## 2015-05-12 DIAGNOSIS — E118 Type 2 diabetes mellitus with unspecified complications: Secondary | ICD-10-CM | POA: Diagnosis not present

## 2015-05-12 DIAGNOSIS — E1165 Type 2 diabetes mellitus with hyperglycemia: Secondary | ICD-10-CM | POA: Diagnosis not present

## 2015-05-13 LAB — T4, FREE: FREE T4: 1.22 ng/dL (ref 0.80–1.80)

## 2015-05-13 LAB — TSH: TSH: 1.766 u[IU]/mL (ref 0.350–4.500)

## 2015-05-13 LAB — HEMOGLOBIN A1C
HEMOGLOBIN A1C: 7.9 % — AB (ref <5.7)
MEAN PLASMA GLUCOSE: 180 mg/dL — AB (ref <117)

## 2015-05-19 ENCOUNTER — Ambulatory Visit: Payer: Medicare Other | Admitting: "Endocrinology

## 2015-05-26 ENCOUNTER — Ambulatory Visit: Payer: Medicare Other | Admitting: "Endocrinology

## 2015-06-02 ENCOUNTER — Ambulatory Visit (HOSPITAL_COMMUNITY): Payer: Self-pay | Admitting: Psychiatry

## 2015-06-02 ENCOUNTER — Telehealth (HOSPITAL_COMMUNITY): Payer: Self-pay | Admitting: *Deleted

## 2015-06-02 NOTE — Telephone Encounter (Signed)
[id number]

## 2015-06-05 ENCOUNTER — Encounter (HOSPITAL_COMMUNITY): Payer: Self-pay | Admitting: Psychiatry

## 2015-06-05 ENCOUNTER — Telehealth (INDEPENDENT_AMBULATORY_CARE_PROVIDER_SITE_OTHER): Payer: Self-pay | Admitting: *Deleted

## 2015-06-05 ENCOUNTER — Ambulatory Visit (INDEPENDENT_AMBULATORY_CARE_PROVIDER_SITE_OTHER): Payer: PPO | Admitting: Psychiatry

## 2015-06-05 VITALS — BP 120/70 | Ht 65.0 in | Wt 261.0 lb

## 2015-06-05 DIAGNOSIS — F3130 Bipolar disorder, current episode depressed, mild or moderate severity, unspecified: Secondary | ICD-10-CM | POA: Diagnosis not present

## 2015-06-05 MED ORDER — LAMOTRIGINE 150 MG PO TABS: 150.0000 mg | ORAL_TABLET | Freq: Every day | ORAL | Status: DC

## 2015-06-05 MED ORDER — VENLAFAXINE HCL ER 150 MG PO CP24: 300.0000 mg | ORAL_CAPSULE | Freq: Every day | ORAL | Status: DC

## 2015-06-05 MED ORDER — BUPROPION HCL ER (XL) 300 MG PO TB24: 300.0000 mg | ORAL_TABLET | ORAL | Status: DC

## 2015-06-05 NOTE — Telephone Encounter (Signed)
Norma [name] presented to the office to say that she had started her Domperidone on 06/03/15. She is having no abdominal pain at this time. She currently has an appointment 11/07/15, and she is questioning if she needs to have an appointment sooner that this? Patient was advised that we would check with Dr.Rehman and call her with his recommendation.

## 2015-06-05 NOTE — Progress Notes (Signed)
Psychiatric Initial Adult Assessment   Patient Identification: Norma [name] MRN:  [id number] Date of Evaluation:  06/05/2015 Referral Source: Dr. Sinda Du Chief Complaint:   Chief Complaint    Depression; Establish Care     Visit Diagnosis:    ICD-9-CM ICD-10-CM   1. Bipolar I disorder, most recent episode depressed ([place]) 296.50 F31.30    Diagnosis:   Patient Active Problem List   Diagnosis Date Noted  . Bipolar I disorder, most recent episode depressed ([place]) [F31.30] 06/05/2015  . Type II diabetes mellitus, uncontrolled ([place]) [E11.65] 09/20/2014  . Chronic low back pain [M54.5, G89.29] 09/20/2014  . Morbid obesity ([place]) [E66.01] 09/20/2014  . Memory difficulty [R41.3] 09/20/2014  . Lumbosacral spondylosis without myelopathy [M47.817] 12/25/2012  . Gastroparesis [K31.84] 11/04/2012  . Nausea alone [R11.0] 11/04/2012  . Restless legs syndrome (RLS) [G25.81] 09/17/2012  . Headache(784.0) [R51] 03/19/2012  . GERD (gastroesophageal reflux disease) [K21.9] 02/16/2012  . Abdominal pain [R10.9] 02/16/2012  . Hypothyroidism [E03.9] 02/16/2012  . Tremor [R25.1] 02/16/2012  . AODM [E11.9] 04/01/2008  . Hyperlipidemia [E78.5] 04/01/2008  . SLEEP APNEA, OBSTRUCTIVE [G47.33] 04/01/2008  . ESSENTIAL HYPERTENSION, BENIGN [I10] 04/01/2008  . LEFT BUNDLE BRANCH BLOCK [I44.7] 04/01/2008   History of Present Illness:  This patient is a 65 year old single white female who lives alone in [place]. 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  The patient was referred by her primary physician, Dr. Sinda Du, for further assessment and treatment of depression. She does have a past history of bipolar disorder.  The patient states that she was diagnosed with depression initially around 2001. She was seeing a therapist but was getting worse and eventually started seeing a  psychiatrist. She was also spending a lot of money that she couldn't afford but she didn't have any other manic or impulsive symptoms. Nevertheless she was diagnosed as being bipolar. She was hospitalized 3 or 4 times around that time. She was hospitalized once for being suicidal. She was at [facility] in went through a course of ECT. She was tried on numerous medications such as Serzone Effexor Wellbutrin Zyprexa. For the last several years she's been on a combination of Effexor XR 300 mg daily Lamictal 150 mg daily and Wellbutrin XL 150 mg daily. She also takes clonazepam for essential tremor.  The patient states she was actually doing fairly well in and hadn't seen a psychiatrist in 3 or 4 years. Dr. Luan Pulling was managing her psychiatric medications. 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 One of the administrators left and took a lot of the clients with him as well as the staff. The company didn't have enough money to pay the nursing assistance around the holidays so the patient herself took out of $5000 loan to pay people. The company ended up going out of business a few weeks ago. The patient is now out of work and financially strapped. She has not yet seen an attorney about money that is owed to her by the company or to get a repayment for the loan.  For the last several weeks she's been sleeping through the day and staying up at night. She has no energy or motivation. She just lies around all day or sleeps. She avoids people and has few friends or activities. She's not close to her family. She denies crying spells or suicidal  ideation. She denies auditory or visual hallucinations. She does state she might get another job with another agency. She denies any current manic symptoms. She has never used drugs or alcohol to any extent. Elements:  Location:  Global. Quality:  Worsening. Severity:  Severe. Timing:  Daily. Duration:  Several months. Context:  Loss of  job, financial stress. Associated Signs/Symptoms: Depression Symptoms:  depressed mood, anhedonia, hypersomnia, psychomotor retardation, feelings of worthlessness/guilt, difficulty concentrating, hopelessness, loss of energy/fatigue, disturbed sleep, (Hypo) Manic Symptoms:  Impulsivity,   Past Medical History:  Past Medical History  Diagnosis Date  . Anemia   . GERD (gastroesophageal reflux disease)   . Hypertension   . Diabetes mellitus   . Morbid obesity ([place])   . Dyslipidemia   . Chronic daily headache   . Depression   . IBS (irritable bowel syndrome)   . Obstructive sleep apnea on CPAP   . Gastroparesis   . Bipolar disorder ([place])   . Hypothyroidism   . Restless legs syndrome (RLS) 09/17/2012  . Degenerative arthritis   . COPD (chronic obstructive pulmonary disease) ([place])   . Bulging lumbar disc     L3-4  . Neuropathy ([place])   . DM type 2 with diabetic peripheral neuropathy ([place]) 09/20/2014  . Chronic low back pain 09/20/2014  . Memory difficulty 09/20/2014  . Diabetes mellitus, type II [facility])     Past Surgical History  Procedure Laterality Date  . Cholecystectomy    . Abdominal hysterectomy    . Lesions      Removed from tongue  . Rectal surgery      fissure  . Arthroscopy knee w/ drilling  06/2011    and Decemer of 2013.  [name] Shoulder surgery      arthroscopy in March of this year   Family History:  Family History  Problem Relation Age of Onset  . Hypertension Mother   . Lymphoma Mother   . Depression Mother   . Arthritis Father   . Alcohol abuse Father   . Hypertension Sister   . Cancer Brother     kidney and lung  . Alcohol abuse Brother   . Alcohol abuse Paternal Uncle   . Alcohol abuse Paternal Grandfather   . Alcohol abuse Paternal Grandmother    Social History:   Social History   Social History  . Marital Status: Single    Spouse Name: N/A  . Number of Children: 0  . Years of Education: 16   Occupational History  .     Social History  Main Topics  . Smoking status: Former Smoker    Types: Cigarettes    Quit date: 02/04/1971  . Smokeless tobacco: Never Used     Comment: smoked 2 cigarettes a day  . Alcohol Use: No  . Drug Use: No  . Sexual Activity: Not Asked   Other Topics Concern  . None   Social History Narrative   Patient lives at home alone.    Patient has no children.    Patient has her masters in nursing.    Patient is single.    Patient drinks about 2 glasses of tea daily.   Patient is right handed.   Additional Social History: The patient grew up in [place] with both parents. She is the second of 4 children. Her father was an alcoholic but he was not mean or abusive. She denies any history of trauma or abuse throughout her life. She has never been married. She has always worked  as a Marine scientist. It seems as of job related stress is always been the cause of her mental illness.  Musculoskeletal: Strength & Muscle Tone: decreased Gait & Station: unsteady Patient leans: N/A  Psychiatric Specialty Exam: HPI  Review of Systems  Constitutional: Positive for malaise/fatigue.  Musculoskeletal: Positive for back pain and joint pain.  Neurological: Positive for tremors.  Psychiatric/Behavioral: Positive for depression and memory loss.    Blood pressure 120/70, height 5\' 5"  (1.651 m), weight 261 lb (118.389 kg).Body mass index is 43.43 kg/(m^2).  General Appearance: Casual and Fairly Groomed  Eye Contact:  Fair  Speech:  Clear and Coherent  Volume:  Decreased  Mood:  Depressed, Dysphoric and Hopeless  Affect:  Constricted, Depressed and Flat  Thought Process:  Goal Directed  Orientation:  Full (Time, Place, and Person)  Thought Content:  Rumination  Suicidal Thoughts:  No  Homicidal Thoughts:  No  Memory:  Immediate;   Good Recent;   Fair Remote;   Poor  Judgement:  Fair  Insight:  Fair  Psychomotor Activity:  Decreased  Concentration:  Poor  Recall:  [place] of Knowledge:Good  Language: Good   Akathisia:  No  Handed:  Right  AIMS (if indicated):    Assets:  Communication Skills Desire for Improvement Resilience Talents/Skills  ADL's:  Intact  Cognition: WNL  Sleep:  Has her days and nights reversed, has sleep apnea and uses CPAP    Is the patient at risk to self?  No. Has the patient been a risk to self in the past 6 months?  no Has the patient been a risk to self within the distant past?  Yes.   Is the patient a risk to others?  No. Has the patient been a risk to others in the past 6 months?  No. Has the patient been a risk to others within the distant past?  No.  Allergies:   Allergies  Allergen Reactions  . Amoxicillin Anaphylaxis  . Hydrocodone Anaphylaxis  . Primidone Anaphylaxis  . Aspirin   . Depacon [Valproate Sodium]     Causes falls   . Lisinopril   . Shellfish Allergy    Current Medications: Current Outpatient Prescriptions  Medication Sig Dispense Refill  . Biotin 5000 MCG CAPS Take 1 capsule by mouth at bedtime.    [name] buPROPion (WELLBUTRIN XL) 300 MG 24 hr tablet Take 1 tablet (300 mg total) by mouth every morning. 30 tablet 2  . canagliflozin (INVOKANA) 100 MG TABS tablet Take 200 mg by mouth daily.    . clonazePAM (KLONOPIN) 0.5 MG tablet TAKE ONE TABLET BY MOUTH TWICE DAILY. 60 tablet 1  . diclofenac sodium (VOLTAREN) 1 % GEL Apply topically as needed.    [name] EPINEPHrine (EPIPEN IJ) Inject as directed once. As needed    . gabapentin (NEURONTIN) 300 MG capsule One capsule twice a day and 2 capsules at night (Patient taking differently: Patient is taking 3 in the afternoon and 4 at night.)    . hydrOXYzine (ATARAX/VISTARIL) 25 MG tablet Take 25 mg by mouth at bedtime as needed for anxiety or itching.     . lamoTRIgine (LAMICTAL) 150 MG tablet Take 1 tablet (150 mg total) by mouth at bedtime. 30 tablet 2  . levothyroxine (SYNTHROID, LEVOTHROID) 100 MCG tablet Take 100 mcg by mouth at bedtime.     [name] losartan (COZAAR) 50 MG tablet Take 50 mg by mouth at  bedtime.     . meclizine (ANTIVERT) 25 MG tablet  Take 1 tablet (25 mg total) by mouth 3 (three) times daily as needed for dizziness. 30 tablet 0  . MELATONIN PO Take 1 tablet by mouth at bedtime as needed (for sleep).    . metFORMIN (GLUCOPHAGE) 500 MG tablet Take 2 tablets (1,000 mg total) by mouth 2 (two) times daily with a meal. 60 tablet 3  . metroNIDAZOLE (METROGEL) 0.75 % gel Apply 1 application topically as needed.    . mupirocin ointment (BACTROBAN) 2 % APPLY AS DIRECTED TWICE DAILY  5  . nystatin-triamcinolone ointment (MYCOLOG) APPLY TWICE DAILY 60 g 0  . omeprazole (PRILOSEC) 40 MG capsule TAKE ONE CAPSULE BY MOUTH ONCE DAILY 90 capsule 3  . pravastatin (PRAVACHOL) 20 MG tablet Take 20 mg by mouth daily.  99  . promethazine (PHENERGAN) 25 MG tablet Take 12.5-25 mg by mouth every 6 (six) hours as needed. 1/2 tablet twice daily as needed    . traMADol (ULTRAM) 50 MG tablet Take 2 tablets (100 mg total) by mouth every 8 (eight) hours as needed for pain. 180 tablet 5  . venlafaxine XR (EFFEXOR-XR) 150 MG 24 hr capsule Take 2 capsules (300 mg total) by mouth at bedtime. 60 capsule 2   No current facility-administered medications for this visit.    Previous Psychotropic Medications: Yes   Substance Abuse History in the last 12 months:  No.  Consequences of Substance Abuse: NA  Medical Decision Making:  Review of Psycho-Social Stressors (1), Review or order clinical lab tests (1), Review and summation of old records (2), Established Problem, Worsening (2), Review of Medication Regimen & Side Effects (2) and Review of New Medication or Change in Dosage (2)  Treatment Plan Summary: Medication management   This patient is a 65 year old female with a long history of mental illness. It's difficult to tease out whether this is truly bipolar disorder or major depression. At any rate she is quite depressed at the moment. She is agreed to increase her Wellbutrin XL to 300 mg every morning and  take it all in the morning and continue the Effexor XR and Lamictal at bedtime. I strongly urged her to strive to stay awake during the day and sleep at night. She also agreed to counseling here. She will return in 4 weeks to see me or call sooner if symptoms worsen    Miana Politte, Norma [name] 2/20/201711:31 AM

## 2015-06-08 ENCOUNTER — Ambulatory Visit: Payer: Medicare Other | Admitting: "Endocrinology

## 2015-06-13 ENCOUNTER — Ambulatory Visit: Payer: Medicare Other | Admitting: "Endocrinology

## 2015-06-20 DIAGNOSIS — G4730 Sleep apnea, unspecified: Secondary | ICD-10-CM | POA: Diagnosis not present

## 2015-06-20 DIAGNOSIS — E1165 Type 2 diabetes mellitus with hyperglycemia: Secondary | ICD-10-CM | POA: Diagnosis not present

## 2015-06-20 DIAGNOSIS — M1990 Unspecified osteoarthritis, unspecified site: Secondary | ICD-10-CM | POA: Diagnosis not present

## 2015-06-20 DIAGNOSIS — J449 Chronic obstructive pulmonary disease, unspecified: Secondary | ICD-10-CM | POA: Diagnosis not present

## 2015-06-28 ENCOUNTER — Ambulatory Visit: Payer: Medicare Other | Admitting: "Endocrinology

## 2015-06-29 ENCOUNTER — Ambulatory Visit (INDEPENDENT_AMBULATORY_CARE_PROVIDER_SITE_OTHER): Payer: PPO | Admitting: Nurse Practitioner

## 2015-06-29 ENCOUNTER — Encounter: Payer: Self-pay | Admitting: Nurse Practitioner

## 2015-06-29 VITALS — BP 120/76 | HR 96 | Ht 65.0 in | Wt 260.0 lb

## 2015-06-29 DIAGNOSIS — G2581 Restless legs syndrome: Secondary | ICD-10-CM | POA: Diagnosis not present

## 2015-06-29 DIAGNOSIS — R251 Tremor, unspecified: Secondary | ICD-10-CM

## 2015-06-29 MED ORDER — CLONAZEPAM 0.5 MG PO TABS: 0.5000 mg | ORAL_TABLET | Freq: Two times a day (BID) | ORAL | Status: DC

## 2015-06-29 NOTE — Progress Notes (Signed)
[facility]  PATIENT: [name] DOB: May 19, 1950   REASON FOR VISIT:follow-up for restless leg syndrome ,essential tremor HISTORY FROM:patient    HISTORY OF PRESENT ILLNESS:Ms. Norma [name] is a 65 year old right-handed white female with a history of morbid obesity and other  disorders such as diabetes, chronic low back pain, peripheral neuropathy, restless leg syndrome, obstructive sleep apnea, and a gait disorder. The patient also has a history of essential tremor. The tremor is relatively mild, the patient will have good days and bad days with the tremor, occasionally she will have difficulty with handwriting. The patient has had ongoing low back pain, some pain down the left leg. MRI of the low back has been done recently questioning some impingement of the left L4 nerve root. The patient has a peripheral neuropathy with numbness that she reports up to the knees bilaterally, associated with some balance issues without any recent falls. Gradual increased fatigue she  has obstructive sleep apnea on CPAP, but she indicates that her CPAP with  no recent download. She has made an appt with her sleep physician to follow up with this. The patient does have restless legs in the evening hours associated some increased pain from her peripheral neuropathy. The patient is on gabapentin taking 300 mg twice during the day and 600 mg at night. Most recent hemoglobin A1c was 7.9 She returns for further evaluation.    REVIEW OF SYSTEMS: Full 14 system review of systems performed and notable only for those listed, all others are neg:  Constitutional: fatigue  Cardiovascular: neg Ear/Nose/Throat: neg  Skin: neg Eyes: blurred vision, itching Respiratory: neg Gastroitestinal: neg  Hematology/Lymphatic: neg  Endocrine: neg Musculoskeletal:joint pain back pain, walking difficulty Allergy/Immunology: neg Neurological: memory loss dizziness tremors Psychiatric: depression followed by  psychiatry Sleep : obstructive sleep apnea with CPAP   ALLERGIES: Allergies  Allergen Reactions  . Amoxicillin Anaphylaxis  . Hydrocodone Anaphylaxis  . Primidone Anaphylaxis  . Aspirin   . Depacon [Valproate Sodium]     Causes falls   . Lisinopril     Dr took off as precaution  . Peanuts [Peanut Oil]     All nuts  . Shellfish Allergy     HOME MEDICATIONS: Outpatient Prescriptions Prior to Visit  Medication Sig Dispense Refill  . Biotin 5000 MCG CAPS Take 1 capsule by mouth at bedtime.    [name] buPROPion (WELLBUTRIN XL) 300 MG 24 hr tablet Take 1 tablet (300 mg total) by mouth every morning. 30 tablet 2  . canagliflozin (INVOKANA) 100 MG TABS tablet Take 200 mg by mouth daily.    . diclofenac sodium (VOLTAREN) 1 % GEL Apply topically as needed.    [name] EPINEPHrine (EPIPEN IJ) Inject as directed once. As needed    . gabapentin (NEURONTIN) 300 MG capsule One capsule twice a day and 2 capsules at night (Patient taking differently: Patient is taking 3 in the afternoon and 4 at night.)    . hydrOXYzine (ATARAX/VISTARIL) 25 MG tablet Take 25 mg by mouth at bedtime as needed for anxiety or itching.     . lamoTRIgine (LAMICTAL) 150 MG tablet Take 1 tablet (150 mg total) by mouth at bedtime. 30 tablet 2  . levothyroxine (SYNTHROID, LEVOTHROID) 100 MCG tablet Take 100 mcg by mouth at bedtime.     [name] losartan (COZAAR) 50 MG tablet Take 50 mg by mouth at bedtime.     . meclizine (ANTIVERT) 25 MG tablet Take 1 tablet (25 mg total) by mouth 3 (three)  times daily as needed for dizziness. 30 tablet 0  . MELATONIN PO Take 1 tablet by mouth at bedtime as needed (for sleep).    . metFORMIN (GLUCOPHAGE) 500 MG tablet Take 2 tablets (1,000 mg total) by mouth 2 (two) times daily with a meal. 60 tablet 3  . metroNIDAZOLE (METROGEL) 0.75 % gel Apply 1 application topically as needed.    . mupirocin ointment (BACTROBAN) 2 % APPLY AS DIRECTED TWICE DAILY  5  . nystatin-triamcinolone ointment (MYCOLOG) APPLY  TWICE DAILY 60 g 0  . omeprazole (PRILOSEC) 40 MG capsule TAKE ONE CAPSULE BY MOUTH ONCE DAILY 90 capsule 3  . pravastatin (PRAVACHOL) 20 MG tablet Take 20 mg by mouth daily.  99  . promethazine (PHENERGAN) 25 MG tablet Take 12.5-25 mg by mouth every 6 (six) hours as needed. 1/2 tablet twice daily as needed    . traMADol (ULTRAM) 50 MG tablet Take 2 tablets (100 mg total) by mouth every 8 (eight) hours as needed for pain. 180 tablet 5  . venlafaxine XR (EFFEXOR-XR) 150 MG 24 hr capsule Take 2 capsules (300 mg total) by mouth at bedtime. 60 capsule 2  . clonazePAM (KLONOPIN) 0.5 MG tablet TAKE ONE TABLET BY MOUTH TWICE DAILY. 60 tablet 1   No facility-administered medications prior to visit.    PAST MEDICAL HISTORY: Past Medical History  Diagnosis Date  . Anemia   . GERD (gastroesophageal reflux disease)   . Hypertension   . Diabetes mellitus   . Morbid obesity ([place])   . Dyslipidemia   . Chronic daily headache   . Depression   . IBS (irritable bowel syndrome)   . Obstructive sleep apnea on CPAP   . Gastroparesis   . Bipolar disorder ([place])   . Hypothyroidism   . Restless legs syndrome (RLS) 09/17/2012  . Degenerative arthritis   . COPD (chronic obstructive pulmonary disease) ([place])   . Bulging lumbar disc     L3-4  . Neuropathy ([place])   . DM type 2 with diabetic peripheral neuropathy ([place]) 09/20/2014  . Chronic low back pain 09/20/2014  . Memory difficulty 09/20/2014  . Diabetes mellitus, type II ([place])     PAST SURGICAL HISTORY: Past Surgical History  Procedure Laterality Date  . Cholecystectomy    . Abdominal hysterectomy    . Lesions      Removed from tongue  . Rectal surgery      fissure  . Arthroscopy knee w/ drilling  06/2011    and Dece[place] of 2013.  [name] Shoulder surgery      arthroscopy in March of this year    FAMILY HISTORY: Family History  Problem Relation Age of Onset  . Hypertension Mother   . Lymphoma Mother   . Depression Mother   . Arthritis Father   .  Alcohol abuse Father   . Hypertension Sister   . Cancer Brother     kidney and lung  . Alcohol abuse Brother   . Alcohol abuse Paternal Uncle   . Alcohol abuse Paternal Grandfather   . Alcohol abuse Paternal Grandmother     SOCIAL HISTORY: Social History   Social History  . Marital Status: Single    Spouse Name: N/A  . Number of Children: 0  . Years of Education: 16   Occupational History  .     Social History Main Topics  . Smoking status: Former Smoker    Types: Cigarettes    Quit date: 02/04/1971  . Smokeless tobacco: Never  Used     Comment: smoked 2 cigarettes a day  . Alcohol Use: No  . Drug Use: No  . Sexual Activity: Not on file   Other Topics Concern  . Not on file   Social History Narrative   Patient lives at home alone.    Patient has no children.    Patient has her masters in nursing.    Patient is single.    Patient drinks about 2 glasses of tea daily.   Patient is right handed.     PHYSICAL EXAM  Filed Vitals:   06/29/15 1642  BP: 120/76  Pulse: 96  Height: 5\' 5"  (1.651 m)  Weight: 260 lb (117.935 kg)   Body mass index is 43.27 kg/(m^2). General: The patient is alert and cooperative at the time of the examination. The patient is morbidly obese. Skin: 1+ edema at the ankles is noted bilaterally. Neurologic Exam Mental status: The patient is alert and oriented x 3 at the time of the examination. The patient has apparent normal recent and remote memory, with  normal attention span and concentration  Cranial nerves: Facial symmetry is present. Speech is normal, no aphasia or dysarthria is noted. Extraocular movements are full. Visual fields are full. Motor: The patient has good strength in all 4 extremities. Sensory examination: Soft touch sensation is symmetric on the face, arms, and legs. Coordination: The patient has good finger-nose-finger and heel-to-shin bilaterally. No significant tremors are noted today. Gait and station: The patient has  a normal gait. Tandem gait is unsteady. Romberg is negative. No drift is seen. Reflexes: Deep tendon reflexes are symmetric, but are depressed in the upper and lower extremities. DIAGNOSTIC DATA (LABS, IMAGING, TESTING) - I reviewed patient records, labs, notes, testing and imaging myself where available.    Lab Results  Component Value Date   HGBA1C 7.9* 05/12/2015   Lab Results  Component Value Date   VITAMINB12 240 09/20/2014   Lab Results  Component Value Date   TSH 1.766 05/12/2015      ASSESSMENT AND PLAN  65 y.o. year old female  has a past medical history of  Hypertension; Diabetes mellitus; Morbid obesity ([place]); Dyslipidemia; Depression; IBS (irritable bowel syndrome); Obstructive sleep apnea on CPAP;  Bipolar disorder ([place]);  Restless legs syndrome (RLS) (09/17/2012); Degenerative arthritis; COPD (chronic obstructive pulmonary disease) (H[facility]); Bulging lumbar disc; Neuropathy ([place]); DM type 2 with diabetic peripheral neuropathy ([place]) (09/20/2014); Chronic low back pain (09/20/2014); Memory difficulty (09/20/2014);. here to follow-up for her restless legs and essential tremor.  Continue clonazepam at current dose, Rx to patient Continue Gabapentin at current dose  Continue close monitoring of diabetes most recent A1C 7.9 Has appt for CPAP download with sleep physician in [place], [place] F/U in 6 months Dennie Bible, [facility], [facility], [facility] [address] [place], [place] [address] [phone]

## 2015-06-29 NOTE — Patient Instructions (Signed)
Continue clonazepam at current dose, Rx to patient Continue Gabapentin Continue close monitoring of diabetes F/U in 6 months

## 2015-06-29 NOTE — Progress Notes (Signed)
I have read the note, and I agree with the clinical assessment and plan.  WILLIS,CHARLES KEITH   

## 2015-06-30 DIAGNOSIS — G4733 Obstructive sleep apnea (adult) (pediatric): Secondary | ICD-10-CM | POA: Diagnosis not present

## 2015-07-03 ENCOUNTER — Ambulatory Visit (HOSPITAL_COMMUNITY): Payer: Self-pay | Admitting: Psychiatry

## 2015-07-12 ENCOUNTER — Ambulatory Visit (INDEPENDENT_AMBULATORY_CARE_PROVIDER_SITE_OTHER): Payer: PPO | Admitting: Psychiatry

## 2015-07-12 ENCOUNTER — Encounter (HOSPITAL_COMMUNITY): Payer: Self-pay | Admitting: Psychiatry

## 2015-07-12 DIAGNOSIS — F3130 Bipolar disorder, current episode depressed, mild or moderate severity, unspecified: Secondary | ICD-10-CM
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
## 2015-07-12 NOTE — Patient Instructions (Signed)
Discussed orally 

## 2015-07-14 ENCOUNTER — Encounter (HOSPITAL_COMMUNITY): Payer: Self-pay | Admitting: Psychiatry

## 2015-07-14 ENCOUNTER — Ambulatory Visit (INDEPENDENT_AMBULATORY_CARE_PROVIDER_SITE_OTHER): Payer: PPO | Admitting: "Endocrinology

## 2015-07-14 ENCOUNTER — Ambulatory Visit (INDEPENDENT_AMBULATORY_CARE_PROVIDER_SITE_OTHER): Payer: PPO | Admitting: Psychiatry

## 2015-07-14 ENCOUNTER — Encounter: Payer: Self-pay | Admitting: "Endocrinology

## 2015-07-14 VITALS — Ht 65.0 in | Wt 261.0 lb

## 2015-07-14 VITALS — BP 130/80 | HR 92 | Ht 65.0 in | Wt 263.0 lb

## 2015-07-14 DIAGNOSIS — F3130 Bipolar disorder, current episode depressed, mild or moderate severity, unspecified: Secondary | ICD-10-CM

## 2015-07-14 DIAGNOSIS — IMO0002 Reserved for concepts with insufficient information to code with codable children: Secondary | ICD-10-CM

## 2015-07-14 DIAGNOSIS — E1165 Type 2 diabetes mellitus with hyperglycemia: Secondary | ICD-10-CM

## 2015-07-14 DIAGNOSIS — E118 Type 2 diabetes mellitus with unspecified complications: Secondary | ICD-10-CM

## 2015-07-14 DIAGNOSIS — I10 Essential (primary) hypertension: Secondary | ICD-10-CM

## 2015-07-14 DIAGNOSIS — E039 Hypothyroidism, unspecified: Secondary | ICD-10-CM | POA: Diagnosis not present

## 2015-07-14 MED ORDER — LAMOTRIGINE 150 MG PO TABS: 150.0000 mg | ORAL_TABLET | Freq: Every day | ORAL | Status: DC

## 2015-07-14 MED ORDER — VENLAFAXINE HCL ER 150 MG PO CP24: 300.0000 mg | ORAL_CAPSULE | Freq: Every day | ORAL | Status: DC

## 2015-07-14 MED ORDER — BUPROPION HCL ER (XL) 300 MG PO TB24: 300.0000 mg | ORAL_TABLET | ORAL | Status: DC

## 2015-07-14 NOTE — Progress Notes (Signed)
Subjective:    Patient ID: Norma [name], female    DOB: [birth year],    Past Medical History  Diagnosis Date  . Anemia   . GERD (gastroesophageal reflux disease)   . Hypertension   . Diabetes mellitus   . Morbid obesity ([place])   . Dyslipidemia   . Chronic daily headache   . Depression   . IBS (irritable bowel syndrome)   . Obstructive sleep apnea on CPAP   . Gastroparesis   . Bipolar disorder ([place])   . Hypothyroidism   . Restless legs syndrome (RLS) 09/17/2012  . Degenerative arthritis   . COPD (chronic obstructive pulmonary disease) ([place])   . Bulging lumbar disc     L3-4  . Neuropathy ([place])   . DM type 2 with diabetic peripheral neuropathy ([place]) 09/20/2014  . Chronic low back pain 09/20/2014  . Memory difficulty 09/20/2014  . Diabetes mellitus, type II [facility])    Past Surgical History  Procedure Laterality Date  . Cholecystectomy    . Abdominal hysterectomy    . Lesions      Removed from tongue  . Rectal surgery      fissure  . Arthroscopy knee w/ drilling  06/2011    and Decemer of 2013.  [name] Shoulder surgery      arthroscopy in March of this year  . Carpel tunnel  1980's   Social History   Social History  . Marital Status: Single    Spouse Name: N/A  . Number of Children: 0  . Years of Education: 16   Occupational History  .     Social History Main Topics  . Smoking status: Former Smoker    Types: Cigarettes    Quit date: 02/04/1971  . Smokeless tobacco: Never Used     Comment: smoked 2 cigarettes a day  . Alcohol Use: No  . Drug Use: No  . Sexual Activity: No   Other Topics Concern  . None   Social History Narrative   Patient lives at home alone.    Patient has no children.    Patient has her masters in nursing.    Patient is single.    Patient drinks about 2 glasses of tea daily.   Patient is right handed.   Outpatient Encounter Prescriptions as of 07/14/2015  Medication Sig  . canagliflozin (INVOKANA) 100 MG TABS tablet Take 100 mg by  mouth daily.  . metFORMIN (GLUCOPHAGE) 500 MG tablet Take 2 tablets (1,000 mg total) by mouth 2 (two) times daily with a meal.  . Biotin 5000 MCG CAPS Take 1 capsule by mouth at bedtime.  [name] buPROPion (WELLBUTRIN XL) 300 MG 24 hr tablet Take 1 tablet (300 mg total) by mouth every morning.  . clonazePAM (KLONOPIN) 0.5 MG tablet Take 1 tablet (0.5 mg total) by mouth 2 (two) times daily.  . diclofenac sodium (VOLTAREN) 1 % GEL Apply topically as needed.  [name] EPINEPHrine (EPIPEN IJ) Inject as directed once. As needed  . gabapentin (NEURONTIN) 300 MG capsule One capsule twice a day and 2 capsules at night (Patient taking differently: Patient is taking 3 in the afternoon and 4 at night.)  . hydrOXYzine (ATARAX/VISTARIL) 25 MG tablet Take 25 mg by mouth at bedtime as needed for anxiety or itching.   . lamoTRIgine (LAMICTAL) 150 MG tablet Take 1 tablet (150 mg total) by mouth at bedtime.  [name] levothyroxine (SYNTHROID, LEVOTHROID) 100 MCG tablet Take 100 mcg by mouth at bedtime.   [name]  losartan (COZAAR) 50 MG tablet Take 50 mg by mouth at bedtime.   . meclizine (ANTIVERT) 25 MG tablet Take 1 tablet (25 mg total) by mouth 3 (three) times daily as needed for dizziness.  [name] MELATONIN PO Take 1 tablet by mouth at bedtime as needed (for sleep).  . metroNIDAZOLE (METROGEL) 0.75 % gel Apply 1 application topically as needed.  . mupirocin ointment (BACTROBAN) 2 % APPLY AS DIRECTED TWICE DAILY  . nystatin-triamcinolone ointment (MYCOLOG) APPLY TWICE DAILY  . omeprazole (PRILOSEC) 40 MG capsule TAKE ONE CAPSULE BY MOUTH ONCE DAILY  . pravastatin (PRAVACHOL) 20 MG tablet Take 20 mg by mouth daily.  . promethazine (PHENERGAN) 25 MG tablet Take 12.5-25 mg by mouth every 6 (six) hours as needed. 1/2 tablet twice daily as needed  . traMADol (ULTRAM) 50 MG tablet Take 2 tablets (100 mg total) by mouth every 8 (eight) hours as needed for pain.  [name] venlafaxine XR (EFFEXOR-XR) 150 MG 24 hr capsule Take 2 capsules (300 mg total) by  mouth at bedtime.   No facility-administered encounter medications on file as of 07/14/2015.   ALLERGIES: Allergies  Allergen Reactions  . Amoxicillin Anaphylaxis  . Hydrocodone Anaphylaxis  . Primidone Anaphylaxis  . Aspirin   . Depacon [Valproate Sodium]     Causes falls   . Lisinopril     Dr took off as precaution  . Peanuts [Peanut Oil]     All nuts  . Shellfish Allergy    VACCINATION STATUS:  There is no immunization history on file for this patient.  Diabetes She presents for her follow-up diabetic visit. She has type 2 diabetes mellitus. Onset time: She was diagnosed at approximate age of 53 years. Her disease course has been improving. There are no hypoglycemic associated symptoms. Pertinent negatives for hypoglycemia include no confusion, headaches, pallor or seizures. Associated symptoms include fatigue, polydipsia and polyuria. Pertinent negatives for diabetes include no chest pain and no polyphagia. There are no hypoglycemic complications. Symptoms are improving. There are no diabetic complications. Risk factors for coronary artery disease include diabetes mellitus, dyslipidemia, hypertension, obesity and sedentary lifestyle. Current diabetic treatment includes oral agent (dual therapy). Her weight is stable. She is following a generally unhealthy diet. She has not had a previous visit with a dietitian. She never participates in exercise. Home blood sugar record trend: She is not monitoring blood glucose. An ACE inhibitor/angiotensin II receptor blocker is being taken. Eye exam is current.  Hypertension This is a chronic problem. The current episode started more than 1 year ago. Pertinent negatives include no chest pain, headaches, palpitations or shortness of breath. Past treatments include ACE inhibitors. Compliance problems include diet, exercise, medication cost, medication side effects and psychosocial issues.  Hypertensive end-organ damage includes a thyroid problem.   Hyperlipidemia This is a chronic problem. The current episode started more than 1 year ago. Pertinent negatives include no chest pain, myalgias or shortness of breath. Current antihyperlipidemic treatment includes statins. Risk factors for coronary artery disease include a sedentary lifestyle, obesity, hypertension, diabetes mellitus and dyslipidemia.  Thyroid Problem Presents for follow-up visit. Symptoms include fatigue. Patient reports no cold intolerance, diarrhea, heat intolerance or palpitations. The symptoms have been stable. Past treatments include levothyroxine. Her past medical history is significant for hyperlipidemia.     Review of Systems  Constitutional: Positive for fatigue. Negative for unexpected weight change.  HENT: Negative for trouble swallowing and voice change.   Eyes: Negative for visual disturbance.  Respiratory: Negative for cough, shortness  of breath and wheezing.   Cardiovascular: Negative for chest pain, palpitations and leg swelling.  Gastrointestinal: Negative for nausea, vomiting and diarrhea.  Endocrine: Positive for polydipsia and polyuria. Negative for cold intolerance, heat intolerance and polyphagia.  Musculoskeletal: Negative for myalgias and arthralgias.  Skin: Negative for color change, pallor, rash and wound.  Neurological: Negative for seizures and headaches.  Psychiatric/Behavioral: Negative for suicidal ideas and confusion.    Objective:    BP 130/80 mmHg  Pulse 92  Ht 5\' 5"  (1.651 m)  Wt 263 lb (119.296 kg)  BMI 43.77 kg/m2  SpO2 93%  Wt Readings from Last 3 Encounters:  07/14/15 263 lb (119.296 kg)  07/14/15 261 lb (118.389 kg)  06/29/15 260 lb (117.935 kg)    Physical Exam  Constitutional: She is oriented to person, place, and time. She appears well-developed.  HENT:  Head: Normocephalic and atraumatic.  Eyes: EOM are normal.  Neck: Normal range of motion. Neck supple. No tracheal deviation present. No thyromegaly present.   Cardiovascular: Normal rate and regular rhythm.   Pulmonary/Chest: Effort normal and breath sounds normal.  Abdominal: Soft. Bowel sounds are normal. There is no tenderness. There is no guarding.  Musculoskeletal: Normal range of motion. She exhibits no edema.  Neurological: She is alert and oriented to person, place, and time. She has normal reflexes. No cranial nerve deficit. Coordination normal.  Skin: Skin is warm and dry. No rash noted. No erythema. No pallor.  Psychiatric: She has a normal mood and affect. Judgment normal.    Results for orders placed or performed in visit on 05/12/15  TSH  Result Value Ref Range   TSH 1.766 0.350 - 4.500 uIU/mL  T4, free  Result Value Ref Range   Free T4 1.22 0.80 - 1.80 ng/dL  Hemoglobin A1c  Result Value Ref Range   Hgb A1c MFr Bld 7.9 (H) <5.7 %   Mean Plasma Glucose 180 (H) <117 mg/dL   Complete Blood Count (Most recent): No results found for: WBC, HGB, HCT, MCV, PLT Chemistry (most recent): Lab Results  Component Value Date   CREATININE 1.10 02/05/2011   Diabetic Labs (most recent): Lab Results  Component Value Date   HGBA1C 7.9* 05/12/2015   HGBA1C 8.3* 02/08/2015    Assessment & Plan:   1. Uncontrolled type 2 diabetes mellitus with complication, without long-term current use of insulin (HCC)  Her diabetes is  complicated by neuropathy and patient remains at a high risk for more acute and chronic complications of diabetes which include CAD, CVA, CKD, retinopathy, and neuropathy. These are all discussed in detail with the patient.  Patient came with  Improved A1c of 7.9% from  8.3 %.  - I have re-counseled the patient on diet management and weight loss  by adopting a carbohydrate restricted / protein rich  Diet.  - Suggestion is made for patient to avoid simple carbohydrates   from their diet including Cakes , Desserts, Ice Cream,  Soda (  diet and regular) , Sweet Tea , Candies,  Chips, Cookies, Artificial Sweeteners,   and  "Sugar-free" Products .  This will help patient to have stable blood glucose profile and potentially avoid unintended  Weight gain.  - Patient is advised to stick to a routine mealtimes to eat 3 meals  a day and avoid unnecessary snacks ( to snack only to correct hypoglycemia).  - The patient  will be  scheduled with Jearld Fenton, RDN, CDE for individualized DM education.  - I have  approached patient with the following individualized plan to manage diabetes and patient agrees.  -For better insulin sensitivity I will continue MTF 1gm PO BID, continue Invokana 100mg  po qam.   - Patient will be considered for incretin therapy as appropriate next visit. - Patient specific target  for A1c; LDL, HDL, Triglycerides, and  Waist Circumference were discussed in detail.  2) BP/HTN: Controlled. Continue current medications including ACEI/ARB. 3) Lipids/HPL:  continue statins. 4)  Weight/Diet: CDE consult in progress, exercise, and carbohydrates information provided.  5) Hypothyroidism, unspecified hypothyroidism type  She is euthyroid. I advised her to continue levothyroxine 100 g by mouth every morning.  - We discussed about correct intake of levothyroxine, at fasting, with water, separated by at least 30 minutes from breakfast, and separated by more than 4 hours from calcium, iron, multivitamins, acid reflux medications (PPIs). -Patient is made aware of the fact that thyroid hormone replacement is needed for life, dose to be adjusted by periodic monitoring of thyroid function tests.  6) Chronic Care/Health Maintenance:  -Patient is on ACEI/ARB and Statin medications and encouraged to continue to follow up with Ophthalmology, Podiatrist at least yearly or according to recommendations, and advised to  stay away from smoking. I have recommended yearly flu vaccine and pneumonia vaccination at least every 5 years; moderate intensity exercise for up to 150 minutes weekly; and  sleep for at least 7 hours a  day.  I advised patient to maintain close follow up with their PCP for primary care needs.  Patient is asked to bring meter and  blood glucose logs during their next visit.   Follow up plan: Return in about 8 weeks (around 09/08/2015) for diabetes, high blood pressure, high cholesterol, underactive thyroid, follow up with pre-visit labs.  Glade Lloyd, MD Phone: [phone]  Fax: [phone]   07/14/2015, 3:21 PM

## 2015-07-14 NOTE — Patient Instructions (Signed)

## 2015-07-14 NOTE — Progress Notes (Signed)
Patient ID: Norma [name], female   DOB: 1950-05-21, 65 y.o.   MRN: [id number]  Psychiatric Initial Adult Assessment   Patient Identification: Norma [name] MRN:  [id number] Date of Evaluation:  07/14/2015 Referral Source: Dr. Sinda Du Chief Complaint:   Chief Complaint    Depression; Anxiety; Follow-up     Visit Diagnosis:    ICD-9-CM ICD-10-CM   1. Bipolar I disorder, most recent episode depressed ([place]) 296.50 F31.30    Diagnosis:   Patient Active Problem List   Diagnosis Date Noted  . Bipolar I disorder, most recent episode depressed ([place]) [F31.30] 06/05/2015  . Type II diabetes mellitus, uncontrolled ([place]) [E11.65] 09/20/2014  . Chronic low back pain [M54.5, G89.29] 09/20/2014  . Morbid obesity ([place]) [E66.01] 09/20/2014  . Memory difficulty [R41.3] 09/20/2014  . Lumbosacral spondylosis without myelopathy [M47.817] 12/25/2012  . Gastroparesis [K31.84] 11/04/2012  . Nausea alone [R11.0] 11/04/2012  . Restless legs syndrome (RLS) [G25.81] 09/17/2012  . Headache(784.0) [R51] 03/19/2012  . GERD (gastroesophageal reflux disease) [K21.9] 02/16/2012  . Abdominal pain [R10.9] 02/16/2012  . Hypothyroidism [E03.9] 02/16/2012  . Tremor [R25.1] 02/16/2012  . AODM [E11.9] 04/01/2008  . Hyperlipidemia [E78.5] 04/01/2008  . SLEEP APNEA, OBSTRUCTIVE [G47.33] 04/01/2008  . ESSENTIAL HYPERTENSION, BENIGN [I10] 04/01/2008  . LEFT BUNDLE BRANCH BLOCK [I44.7] 04/01/2008   History of Present Illness:  This patient is a 66 year old single white female who lives alone in [place]. 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  The patient was referred by her primary physician, Dr. Sinda Du, for further assessment and treatment of depression. She does have a past history of bipolar disorder.  The patient states that she was diagnosed with depression initially around 2001. She  was seeing a therapist but was getting worse and eventually started seeing a psychiatrist. She was also spending a lot of money that she couldn't afford but she didn't have any other manic or impulsive symptoms. Nevertheless she was diagnosed as being bipolar. She was hospitalized 3 or 4 times around that time. She was hospitalized once for being suicidal. She was at [facility] in went through a course of ECT. She was tried on numerous medications such as Serzone Effexor Wellbutrin Zyprexa. For the last several years she's been on a combination of Effexor XR 300 mg daily Lamictal 150 mg daily and Wellbutrin XL 150 mg daily. She also takes clonazepam for essential tremor.  The patient states she was actually doing fairly well in and hadn't seen a psychiatrist in 3 or 4 years. Dr. Luan Pulling was managing her psychiatric medications. 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 One of the administrators left and took a lot of the clients with him as well as the staff. The company didn't have enough money to pay the nursing assistance around the holidays so the patient herself took out of $5000 loan to pay people. The company ended up going out of business a few weeks ago. The patient is now out of work and financially strapped. She has not yet seen an attorney about money that is owed to her by the company or to get a repayment for the loan.  For the last several weeks she's been sleeping through the day and staying up at night. She has no energy or motivation. She just lies around all day or sleeps. She avoids people and  has few friends or activities. She's not close to her family. She denies crying spells or suicidal ideation. She denies auditory or visual hallucinations. She does state she might get another job with another agency. She denies any current manic symptoms. She has never used drugs or alcohol to any extent.  The patient returns after 6 weeks. She states she is a  little bit better since we increased the Wellbutrin XL to 300 mg daily. She has a little bit more energy. She's trying to help a friend get another home health agency started up. She is a little bit more active during the day than she was before. She has some tongue thrusting motions today and states it's because her mouth is dry and her partial plate is bothering her. She is a little bit negative today but denies any suicidal ideation Elements:  Location:  Global. Quality:  Worsening. Severity:  Severe. Timing:  Daily. Duration:  Several months. Context:  Loss of job, financial stress. Associated Signs/Symptoms: Depression Symptoms:  depressed mood, anhedonia, hypersomnia, psychomotor retardation, feelings of worthlessness/guilt, difficulty concentrating, hopelessness, loss of energy/fatigue, disturbed sleep, (Hypo) Manic Symptoms:  Impulsivity,   Past Medical History:  Past Medical History  Diagnosis Date  . Anemia   . GERD (gastroesophageal reflux disease)   . Hypertension   . Diabetes mellitus   . Morbid obesity ([place])   . Dyslipidemia   . Chronic daily headache   . Depression   . IBS (irritable bowel syndrome)   . Obstructive sleep apnea on CPAP   . Gastroparesis   . Bipolar disorder ([place])   . Hypothyroidism   . Restless legs syndrome (RLS) 09/17/2012  . Degenerative arthritis   . COPD (chronic obstructive pulmonary disease) ([place])   . Bulging lumbar disc     L3-4  . Neuropathy ([place])   . DM type 2 with diabetic peripheral neuropathy ([place]) 09/20/2014  . Chronic low back pain 09/20/2014  . Memory difficulty 09/20/2014  . Diabetes mellitus, type II [facility])     Past Surgical History  Procedure Laterality Date  . Cholecystectomy    . Abdominal hysterectomy    . Lesions      Removed from tongue  . Rectal surgery      fissure  . Arthroscopy knee w/ drilling  06/2011    and Dece[place] of 2013.  [name] Shoulder surgery      arthroscopy in March of this year  . Carpel tunnel  1980's    Family History:  Family History  Problem Relation Age of Onset  . Hypertension Mother   . Lymphoma Mother   . Depression Mother   . Arthritis Father   . Alcohol abuse Father   . Hypertension Sister   . Cancer Brother     kidney and lung  . Alcohol abuse Brother   . Alcohol abuse Paternal Uncle   . Alcohol abuse Paternal Grandfather   . Alcohol abuse Paternal Grandmother    Social History:   Social History   Social History  . Marital Status: Single    Spouse Name: N/A  . Number of Children: 0  . Years of Education: 16   Occupational History  .     Social History Main Topics  . Smoking status: Former Smoker    Types: Cigarettes    Quit date: 02/04/1971  . Smokeless tobacco: Never Used     Comment: smoked 2 cigarettes a day  . Alcohol Use: No  . Drug Use: No  .  Sexual Activity: No   Other Topics Concern  . None   Social History Narrative   Patient lives at home alone.    Patient has no children.    Patient has her masters in nursing.    Patient is single.    Patient drinks about 2 glasses of tea daily.   Patient is right handed.   Additional Social History: The patient grew up in [place] with both parents. She is the second of 4 children. Her father was an alcoholic but he was not mean or abusive. She denies any history of trauma or abuse throughout her life. She has never been married. She has always worked as a Marine scientist. It seems as of job related stress is always been the cause of her mental illness.  Musculoskeletal: Strength & Muscle Tone: decreased Gait & Station: unsteady Patient leans: N/A  Psychiatric Specialty Exam: Depression        Past medical history includes anxiety.   Anxiety      Review of Systems  Constitutional: Positive for malaise/fatigue.  Musculoskeletal: Positive for back pain and joint pain.  Neurological: Positive for tremors.  Psychiatric/Behavioral: Positive for depression and memory loss.    Height 5\' 5"  (1.651 m), weight  261 lb (118.389 kg).Body mass index is 43.43 kg/(m^2).  General Appearance: Casual and Fairly Groomed  Eye Contact:  Fair  Speech:  Clear and Coherent  Volume:  Decreased  Mood: Mildly depressed   Affect:  Constricted, better than last visit   Thought Process:  Goal Directed  Orientation:  Full (Time, Place, and Person)  Thought Content:  Rumination  Suicidal Thoughts:  No  Homicidal Thoughts:  No  Memory:  Immediate;   Good Recent;   Fair Remote;   Poor  Judgement:  Fair  Insight:  Fair  Psychomotor Activity:  Decreased  Concentration:  Poor  Recall:  [place] of Knowledge:Good  Language: Good  Akathisia:  No  Handed:  Right  AIMS (if indicated):    Assets:  Communication Skills Desire for Improvement Resilience Talents/Skills  ADL's:  Intact  Cognition: WNL  Sleep:  Has her days and nights reversed, has sleep apnea and uses CPAP    Is the patient at risk to self?  No. Has the patient been a risk to self in the past 6 months?  no Has the patient been a risk to self within the distant past?  Yes.   Is the patient a risk to others?  No. Has the patient been a risk to others in the past 6 months?  No. Has the patient been a risk to others within the distant past?  No.  Allergies:   Allergies  Allergen Reactions  . Amoxicillin Anaphylaxis  . Hydrocodone Anaphylaxis  . Primidone Anaphylaxis  . Aspirin   . Depacon [Valproate Sodium]     Causes falls   . Lisinopril     Dr took off as precaution  . Peanuts [Peanut Oil]     All nuts  . Shellfish Allergy    Current Medications: Current Outpatient Prescriptions  Medication Sig Dispense Refill  . Biotin 5000 MCG CAPS Take 1 capsule by mouth at bedtime.    [name] buPROPion (WELLBUTRIN XL) 300 MG 24 hr tablet Take 1 tablet (300 mg total) by mouth every morning. 30 tablet 2  . canagliflozin (INVOKANA) 100 MG TABS tablet Take 200 mg by mouth daily.    . clonazePAM (KLONOPIN) 0.5 MG tablet Take 1 tablet (0.5 mg total)  by  mouth 2 (two) times daily. 60 tablet 5  . diclofenac sodium (VOLTAREN) 1 % GEL Apply topically as needed.    [name] EPINEPHrine (EPIPEN IJ) Inject as directed once. As needed    . gabapentin (NEURONTIN) 300 MG capsule One capsule twice a day and 2 capsules at night (Patient taking differently: Patient is taking 3 in the afternoon and 4 at night.)    . hydrOXYzine (ATARAX/VISTARIL) 25 MG tablet Take 25 mg by mouth at bedtime as needed for anxiety or itching.     . lamoTRIgine (LAMICTAL) 150 MG tablet Take 1 tablet (150 mg total) by mouth at bedtime. 30 tablet 2  . levothyroxine (SYNTHROID, LEVOTHROID) 100 MCG tablet Take 100 mcg by mouth at bedtime.     [name] losartan (COZAAR) 50 MG tablet Take 50 mg by mouth at bedtime.     . meclizine (ANTIVERT) 25 MG tablet Take 1 tablet (25 mg total) by mouth 3 (three) times daily as needed for dizziness. 30 tablet 0  . MELATONIN PO Take 1 tablet by mouth at bedtime as needed (for sleep).    . metFORMIN (GLUCOPHAGE) 500 MG tablet Take 2 tablets (1,000 mg total) by mouth 2 (two) times daily with a meal. 60 tablet 3  . metroNIDAZOLE (METROGEL) 0.75 % gel Apply 1 application topically as needed.    . mupirocin ointment (BACTROBAN) 2 % APPLY AS DIRECTED TWICE DAILY  5  . nystatin-triamcinolone ointment (MYCOLOG) APPLY TWICE DAILY 60 g 0  . omeprazole (PRILOSEC) 40 MG capsule TAKE ONE CAPSULE BY MOUTH ONCE DAILY 90 capsule 3  . pravastatin (PRAVACHOL) 20 MG tablet Take 20 mg by mouth daily.  99  . promethazine (PHENERGAN) 25 MG tablet Take 12.5-25 mg by mouth every 6 (six) hours as needed. 1/2 tablet twice daily as needed    . traMADol (ULTRAM) 50 MG tablet Take 2 tablets (100 mg total) by mouth every 8 (eight) hours as needed for pain. 180 tablet 5  . venlafaxine XR (EFFEXOR-XR) 150 MG 24 hr capsule Take 2 capsules (300 mg total) by mouth at bedtime. 60 capsule 2   No current facility-administered medications for this visit.    Previous Psychotropic Medications: Yes    Substance Abuse History in the last 12 months:  No.  Consequences of Substance Abuse: NA  Medical Decision Making:  Review of Psycho-Social Stressors (1), Review or order clinical lab tests (1), Review and summation of old records (2), Established Problem, Worsening (2), Review of Medication Regimen & Side Effects (2) and Review of New Medication or Change in Dosage (2)  Treatment Plan Summary: Medication management   The patient will continue Wellbutrin and Effexor for depression and Lamictal for mood swings. She will continue clonazepam for anxiety. She'll continue her counseling with Maurice Small and return to see me in 2 months or call sooner if needed    [facility] 3/31/20171:22 PM

## 2015-08-04 ENCOUNTER — Ambulatory Visit: Payer: Medicare Other | Admitting: Nutrition

## 2015-08-07 ENCOUNTER — Ambulatory Visit (HOSPITAL_COMMUNITY): Payer: Self-pay | Admitting: Psychiatry

## 2015-08-09 ENCOUNTER — Ambulatory Visit: Payer: PPO | Admitting: Nutrition

## 2015-08-14 ENCOUNTER — Telehealth (HOSPITAL_COMMUNITY): Payer: Self-pay | Admitting: *Deleted

## 2015-08-14 NOTE — Telephone Encounter (Signed)
Discharge pt

## 2015-08-14 NOTE — Telephone Encounter (Signed)
Pt called back to cancel her appt with Dr. Harrington Challenger. Per pt, she don't have the money to come to appt. Asked pt if she was interested in applying for financial assistance and she stated no she don't think she'll qualify. Asked pt who will be taking over her medications and she stated Dr. Luan Pulling will. Asked pt if she discussed this with Dr. Luan Pulling and she stated he will because he was filling medications for her before seeing Dr. Harrington Challenger. Per pt, she don't mind if Dr. Harrington Challenger close out her chart but she will not be able to come to appts.

## 2015-08-14 NOTE — Telephone Encounter (Signed)
Spoke with pt and message was sent to provider 

## 2015-08-14 NOTE — Telephone Encounter (Signed)
voice message from patient, she would like to cancel all appointments, said she don't have the money to come.

## 2015-08-15 ENCOUNTER — Encounter (HOSPITAL_COMMUNITY): Payer: Self-pay | Admitting: *Deleted

## 2015-08-18 NOTE — Telephone Encounter (Signed)
noted 

## 2015-08-21 ENCOUNTER — Ambulatory Visit (HOSPITAL_COMMUNITY): Payer: Self-pay | Admitting: Psychiatry

## 2015-09-07 DIAGNOSIS — E039 Hypothyroidism, unspecified: Secondary | ICD-10-CM | POA: Diagnosis not present

## 2015-09-07 DIAGNOSIS — E1165 Type 2 diabetes mellitus with hyperglycemia: Secondary | ICD-10-CM | POA: Diagnosis not present

## 2015-09-07 DIAGNOSIS — E118 Type 2 diabetes mellitus with unspecified complications: Secondary | ICD-10-CM | POA: Diagnosis not present

## 2015-09-07 LAB — HEMOGLOBIN A1C: Hemoglobin A1C: 7.8

## 2015-09-08 ENCOUNTER — Telehealth (HOSPITAL_COMMUNITY): Payer: Self-pay | Admitting: *Deleted

## 2015-09-08 NOTE — Telephone Encounter (Signed)
Pt called asking if she could get pt assistance number for financial assistance with [facility] and office provided it to her. Then pt asked if she could make appt with Dr. Harrington Challenger and Maurice Small. When staff pulled pt up in system, staff realized pt was dismissed from practice. Informed pt that per chart, it looks like office can not resch appt due to her calling on 08-14-2015 stating she don't mind if Dr. Harrington Challenger close her chart and her PCP was going to fill her medications. Per pt, she can't remember but okay. Office staff then offered patient with other office numbers to call to sch appt with them and pt turned them down and stated she did not want them.

## 2015-09-13 ENCOUNTER — Ambulatory Visit: Payer: PPO | Admitting: "Endocrinology

## 2015-09-13 ENCOUNTER — Ambulatory Visit (HOSPITAL_COMMUNITY): Payer: Self-pay | Admitting: Psychiatry

## 2015-09-13 DIAGNOSIS — M25561 Pain in right knee: Secondary | ICD-10-CM | POA: Diagnosis not present

## 2015-09-13 DIAGNOSIS — M25562 Pain in left knee: Secondary | ICD-10-CM | POA: Diagnosis not present

## 2015-09-13 DIAGNOSIS — M170 Bilateral primary osteoarthritis of knee: Secondary | ICD-10-CM | POA: Diagnosis not present

## 2015-09-21 DIAGNOSIS — M25562 Pain in left knee: Secondary | ICD-10-CM | POA: Diagnosis not present

## 2015-09-21 DIAGNOSIS — L84 Corns and callosities: Secondary | ICD-10-CM | POA: Diagnosis not present

## 2015-09-21 DIAGNOSIS — M170 Bilateral primary osteoarthritis of knee: Secondary | ICD-10-CM | POA: Diagnosis not present

## 2015-09-21 DIAGNOSIS — R262 Difficulty in walking, not elsewhere classified: Secondary | ICD-10-CM | POA: Diagnosis not present

## 2015-09-21 DIAGNOSIS — M1711 Unilateral primary osteoarthritis, right knee: Secondary | ICD-10-CM | POA: Diagnosis not present

## 2015-09-21 DIAGNOSIS — M25561 Pain in right knee: Secondary | ICD-10-CM | POA: Diagnosis not present

## 2015-09-21 DIAGNOSIS — E1142 Type 2 diabetes mellitus with diabetic polyneuropathy: Secondary | ICD-10-CM | POA: Diagnosis not present

## 2015-09-21 DIAGNOSIS — B351 Tinea unguium: Secondary | ICD-10-CM | POA: Diagnosis not present

## 2015-09-25 ENCOUNTER — Telehealth (HOSPITAL_COMMUNITY): Payer: Self-pay | Admitting: *Deleted

## 2015-09-25 NOTE — Telephone Encounter (Signed)
Office sent discharge letter to pt via certified mail. Office received discharged mail back. Certified Mail article number [id number]. On mail, it stated that letter was unclaimed and was unable to to forward.

## 2015-09-26 ENCOUNTER — Ambulatory Visit (INDEPENDENT_AMBULATORY_CARE_PROVIDER_SITE_OTHER): Payer: PPO | Admitting: Nurse Practitioner

## 2015-09-26 ENCOUNTER — Encounter: Payer: Self-pay | Admitting: Nurse Practitioner

## 2015-09-26 VITALS — BP 122/58 | HR 84 | Ht 65.0 in | Wt 258.6 lb

## 2015-09-26 DIAGNOSIS — R251 Tremor, unspecified: Secondary | ICD-10-CM

## 2015-09-26 DIAGNOSIS — G2581 Restless legs syndrome: Secondary | ICD-10-CM | POA: Diagnosis not present

## 2015-09-26 DIAGNOSIS — G8929 Other chronic pain: Secondary | ICD-10-CM

## 2015-09-26 DIAGNOSIS — M1711 Unilateral primary osteoarthritis, right knee: Secondary | ICD-10-CM | POA: Diagnosis not present

## 2015-09-26 DIAGNOSIS — M545 Low back pain: Secondary | ICD-10-CM | POA: Diagnosis not present

## 2015-09-26 DIAGNOSIS — M25561 Pain in right knee: Secondary | ICD-10-CM | POA: Diagnosis not present

## 2015-09-26 NOTE — Patient Instructions (Signed)
Continue clonazepam at current dose, Rx to patient Continue Gabapentin at current dose  Continue close monitoring of diabetes most recent A1C 7.9 Follow up in 6 months

## 2015-09-26 NOTE — Progress Notes (Addendum)
[facility]  PATIENT: Norma [name] DOB: 1950/11/07   REASON FOR VISIT: Follow-up for tremor,  restless legs ,obesity HISTORY FROM: Patient    HISTORY OF PRESENT ILLNESS:Norma [name] is a 65 year old right-handed white female with a history of morbid obesity and other disorders such as diabetes, chronic low back pain, peripheral neuropathy, restless leg syndrome, obstructive sleep apnea, and a gait disorder. The patient also has a history of essential tremor. The tremor is relatively mild, the patient will have good days and bad days with the tremor, occasionally she will have difficulty with handwriting. The patient has had ongoing low back pain, some pain down the left leg. MRI of the low back has been done recently questioning some impingement of the left L4 nerve root. The patient has a peripheral neuropathy with numbness that she reports up to the knees bilaterally, associated with some balance issues without any recent falls. Gradual increased fatigue she has obstructive sleep apnea on CPAP. She had recent  appt with her sleep physician to follow up on compliance . The patient does have restless legs in the evening hours associated some increased pain from her peripheral neuropathy. The patient is on gabapentin taking 300 mg twice during the day and 600 mg at night. Most recent hemoglobin A1c was 7.9 She returns for further evaluation.    REVIEW OF SYSTEMS: Full 14 system review of systems performed and notable only for those listed, all others are neg:  Constitutional: Fatigue Cardiovascular: neg Ear/Nose/Throat: neg  Skin: neg Eyes: neg Respiratory: neg Gastroitestinal: neg  Hematology/Lymphatic: neg  Endocrine: neg Musculoskeletal: Walking difficulty, joint pain Allergy/Immunology: neg Neurological: neg Psychiatric: Depression and anxiety no longer seeing psychiatrist Sleep : Obstructive sleep apnea with CPAP   ALLERGIES: Allergies  Allergen  Reactions  . Amoxicillin Anaphylaxis  . Hydrocodone Anaphylaxis  . Primidone Anaphylaxis  . Aspirin   . Depacon [Valproic Acid]     Causes falls   . Lisinopril     Dr took off as precaution  . Peanuts [Peanut Oil]     All nuts  . Shellfish Allergy     HOME MEDICATIONS: Outpatient Prescriptions Prior to Visit  Medication Sig Dispense Refill  . buPROPion (WELLBUTRIN XL) 300 MG 24 hr tablet Take 1 tablet (300 mg total) by mouth every morning. 30 tablet 2  . canagliflozin (INVOKANA) 100 MG TABS tablet Take 100 mg by mouth daily.    . clonazePAM (KLONOPIN) 0.5 MG tablet Take 1 tablet (0.5 mg total) by mouth 2 (two) times daily. 60 tablet 5  . diclofenac sodium (VOLTAREN) 1 % GEL Apply topically as needed.    [name] EPINEPHrine (EPIPEN IJ) Inject as directed once. As needed    . gabapentin (NEURONTIN) 300 MG capsule One capsule twice a day and 2 capsules at night (Patient taking differently: 1,200 mg 2 (two) times daily. )    . hydrOXYzine (ATARAX/VISTARIL) 25 MG tablet Take 25 mg by mouth at bedtime as needed for anxiety or itching.     . lamoTRIgine (LAMICTAL) 150 MG tablet Take 1 tablet (150 mg total) by mouth at bedtime. 30 tablet 2  . levothyroxine (SYNTHROID, LEVOTHROID) 100 MCG tablet Take 100 mcg by mouth at bedtime.     [name] losartan (COZAAR) 50 MG tablet Take 50 mg by mouth at bedtime.     . meclizine (ANTIVERT) 25 MG tablet Take 1 tablet (25 mg total) by mouth 3 (three) times daily as needed for dizziness. 30 tablet 0  .  MELATONIN PO Take 1 tablet by mouth at bedtime as needed (for sleep).    . metFORMIN (GLUCOPHAGE) 500 MG tablet Take 2 tablets (1,000 mg total) by mouth 2 (two) times daily with a meal. 60 tablet 3  . metroNIDAZOLE (METROGEL) 0.75 % gel Apply 1 application topically as needed.    . mupirocin ointment (BACTROBAN) 2 % APPLY AS DIRECTED TWICE DAILY  5  . nystatin-triamcinolone ointment (MYCOLOG) APPLY TWICE DAILY 60 g 0  . omeprazole (PRILOSEC) 40 MG capsule TAKE ONE  CAPSULE BY MOUTH ONCE DAILY 90 capsule 3  . pravastatin (PRAVACHOL) 20 MG tablet Take 20 mg by mouth daily.  99  . promethazine (PHENERGAN) 25 MG tablet Take 12.5-25 mg by mouth every 6 (six) hours as needed. 1/2 tablet twice daily as needed    . traMADol (ULTRAM) 50 MG tablet Take 2 tablets (100 mg total) by mouth every 8 (eight) hours as needed for pain. 180 tablet 5  . venlafaxine XR (EFFEXOR-XR) 150 MG 24 hr capsule Take 2 capsules (300 mg total) by mouth at bedtime. 60 capsule 2  . Biotin 5000 MCG CAPS Take 1 capsule by mouth at bedtime.     No facility-administered medications prior to visit.    PAST MEDICAL HISTORY: Past Medical History  Diagnosis Date  . Anemia   . GERD (gastroesophageal reflux disease)   . Hypertension   . Diabetes mellitus   . Morbid obesity ([place])   . Dyslipidemia   . Chronic daily headache   . Depression   . IBS (irritable bowel syndrome)   . Obstructive sleep apnea on CPAP   . Gastroparesis   . Bipolar disorder ([place])   . Hypothyroidism   . Restless legs syndrome (RLS) 09/17/2012  . Degenerative arthritis   . COPD (chronic obstructive pulmonary disease) ([place])   . Bulging lumbar disc     L3-4  . Neuropathy ([place])   . DM type 2 with diabetic peripheral neuropathy ([place]) 09/20/2014  . Chronic low back pain 09/20/2014  . Memory difficulty 09/20/2014  . Diabetes mellitus, type II ([place])     PAST SURGICAL HISTORY: Past Surgical History  Procedure Laterality Date  . Cholecystectomy    . Abdominal hysterectomy    . Lesions      Removed from tongue  . Rectal surgery      fissure  . Arthroscopy knee w/ drilling  06/2011    and Decemer of 2013.  [name] Shoulder surgery      arthroscopy in March of this year  . Carpel tunnel  1980's    FAMILY HISTORY: Family History  Problem Relation Age of Onset  . Hypertension Mother   . Lymphoma Mother   . Depression Mother   . Arthritis Father   . Alcohol abuse Father   . Hypertension Sister   . Cancer Brother      kidney and lung  . Alcohol abuse Brother   . Alcohol abuse Paternal Uncle   . Alcohol abuse Paternal Grandfather   . Alcohol abuse Paternal Grandmother     SOCIAL HISTORY: Social History   Social History  . Marital Status: Single    Spouse Name: N/A  . Number of Children: 0  . Years of Education: 16   Occupational History  .     Social History Main Topics  . Smoking status: Former Smoker    Types: Cigarettes    Quit date: 02/04/1971  . Smokeless tobacco: Never Used     Comment: smoked  2 cigarettes a day  . Alcohol Use: No  . Drug Use: No  . Sexual Activity: No   Other Topics Concern  . Not on file   Social History Narrative   Patient lives at home alone.    Patient has no children.    Patient has her masters in nursing.    Patient is single.    Patient drinks about 2 glasses of tea daily.   Patient is right handed.     PHYSICAL EXAM  Filed Vitals:   09/26/15 1115  BP: 122/58  Pulse: 84  Height: 5\' 5"  (1.651 m)  Weight: 258 lb 9.6 oz (117.3 kg)   Body mass index is 43.03 kg/(m^2). General: The patient is alert and cooperative at the time of the examination. The patient is morbidly obese. Skin: 1+ edema at the ankles is noted bilaterally. Neurologic Exam Mental status: The patient is alert and oriented x 3 at the time of the examination. The patient has apparent normal recent and remote memory, with normal attention span and concentration  Cranial nerves: Facial symmetry is present. Speech is normal, no aphasia or dysarthria is noted. Extraocular movements are full. Visual fields are full. Motor: The patient has good strength in all 4 extremities. Sensory examination: Soft touch sensation is symmetric on the face, arms, and legs. Coordination: The patient has good finger-nose-finger and heel-to-shin bilaterally. No significant tremors are noted today. Gait and station: The patient has a normal gait. Tandem gait is unsteady. Romberg is negative. No drift is  seen.No assistive device Reflexes: Deep tendon reflexes are symmetric, but are depressed in the upper and lower extremities.  DIAGNOSTIC DATA (LABS, IMAGING, TESTING) -  Lab Results  Component Value Date   HGBA1C 7.9* 05/12/2015    Lab Results  Component Value Date   TSH 1.766 05/12/2015      ASSESSMENT AND PLAN 65 y.o. year old female has a past medical history of Hypertension; Diabetes mellitus; Morbid obesity ([place]); Dyslipidemia; Depression; IBS (irritable bowel syndrome); Obstructive sleep apnea on CPAP; Bipolar disorder ([place]); Restless legs syndrome (RLS) (09/17/2012); Degenerative arthritis; COPD (chronic obstructive pulmonary disease) (HCC); Bulging lumbar disc; Neuropathy ([place]); DM type 2 with diabetic peripheral neuropathy ([place]) (09/20/2014); Chronic low back pain (09/20/2014); Memory difficulty (09/20/2014);. here to follow-up for her restless legs and essential tremor.The patient is a current patient of Dr. Jannifer Franklin  who is out of the office today . This note is sent to the work in doctor.     Continue clonazepam at current dose, does not need refills Continue Gabapentin at current dose RX by Dr. Luan Pulling Continue close monitoring of diabetes most recent A1C 7.9 Need  to exercise for overall health and well-being Stay well hydrated F/U in 6 months  Dennie Bible, [facility], [facility], APRN  [facility] [address] [place], [place] [address] [phone]  Personally reviewed plan as stated above and agree.  I have personally reviewed the history, evaluated lab date, reviewed imaging studies and agree with radiology interpretations.   [facility]

## 2015-09-27 ENCOUNTER — Ambulatory Visit (INDEPENDENT_AMBULATORY_CARE_PROVIDER_SITE_OTHER): Payer: PPO | Admitting: "Endocrinology

## 2015-09-27 ENCOUNTER — Encounter: Payer: Self-pay | Admitting: "Endocrinology

## 2015-09-27 VITALS — BP 148/81 | HR 108 | Ht 65.0 in | Wt 258.0 lb

## 2015-09-27 DIAGNOSIS — E1165 Type 2 diabetes mellitus with hyperglycemia: Secondary | ICD-10-CM

## 2015-09-27 DIAGNOSIS — I10 Essential (primary) hypertension: Secondary | ICD-10-CM

## 2015-09-27 DIAGNOSIS — E785 Hyperlipidemia, unspecified: Secondary | ICD-10-CM

## 2015-09-27 DIAGNOSIS — E118 Type 2 diabetes mellitus with unspecified complications: Secondary | ICD-10-CM

## 2015-09-27 DIAGNOSIS — E039 Hypothyroidism, unspecified: Secondary | ICD-10-CM

## 2015-09-27 DIAGNOSIS — IMO0002 Reserved for concepts with insufficient information to code with codable children: Secondary | ICD-10-CM

## 2015-09-27 MED ORDER — CANAGLIFLOZIN 300 MG PO TABS: 300.0000 mg | ORAL_TABLET | Freq: Every day | ORAL | Status: DC

## 2015-09-27 NOTE — Progress Notes (Signed)
Subjective:    Patient ID: Norma [name], female    DOB: 08/16/1950,    Past Medical History  Diagnosis Date  . Anemia   . GERD (gastroesophageal reflux disease)   . Hypertension   . Diabetes mellitus   . Morbid obesity ([place])   . Dyslipidemia   . Chronic daily headache   . Depression   . IBS (irritable bowel syndrome)   . Obstructive sleep apnea on CPAP   . Gastroparesis   . Bipolar disorder ([place])   . Hypothyroidism   . Restless legs syndrome (RLS) 09/17/2012  . Degenerative arthritis   . COPD (chronic obstructive pulmonary disease) ([place])   . Bulging lumbar disc     L3-4  . Neuropathy ([place])   . DM type 2 with diabetic peripheral neuropathy ([place]) 09/20/2014  . Chronic low back pain 09/20/2014  . Memory difficulty 09/20/2014  . Diabetes mellitus, type II [facility])    Past Surgical History  Procedure Laterality Date  . Cholecystectomy    . Abdominal hysterectomy    . Lesions      Removed from tongue  . Rectal surgery      fissure  . Arthroscopy knee w/ drilling  06/2011    and Decemer of 2013.  [name] Shoulder surgery      arthroscopy in March of this year  . Carpel tunnel  1980's   Social History   Social History  . Marital Status: Single    Spouse Name: N/A  . Number of Children: 0  . Years of Education: 16   Occupational History  .     Social History Main Topics  . Smoking status: Former Smoker    Types: Cigarettes    Quit date: 02/04/1971  . Smokeless tobacco: Never Used     Comment: smoked 2 cigarettes a day  . Alcohol Use: No  . Drug Use: No  . Sexual Activity: No   Other Topics Concern  . None   Social History Narrative   Patient lives at home alone.    Patient has no children.    Patient has her masters in nursing.    Patient is single.    Patient drinks about 2 glasses of tea daily.   Patient is right handed.   Outpatient Encounter Prescriptions as of 09/27/2015  Medication Sig  . Biotin (HM BIOTIN) 16109 MCG TBDP Take 1 tablet by mouth  daily.  [name] buPROPion (WELLBUTRIN XL) 300 MG 24 hr tablet Take 1 tablet (300 mg total) by mouth every morning.  . canagliflozin (INVOKANA) 300 MG TABS tablet Take 1 tablet (300 mg total) by mouth daily.  . clonazePAM (KLONOPIN) 0.5 MG tablet Take 1 tablet (0.5 mg total) by mouth 2 (two) times daily.  . diclofenac sodium (VOLTAREN) 1 % GEL Apply topically as needed.  [name] EPINEPHrine (EPIPEN IJ) Inject as directed once. As needed  . fexofenadine (ALLEGRA) 180 MG tablet Take 180 mg by mouth daily as needed for allergies or rhinitis.  [name] gabapentin (NEURONTIN) 300 MG capsule One capsule twice a day and 2 capsules at night (Patient taking differently: 1,200 mg 2 (two) times daily. )  . hydrOXYzine (ATARAX/VISTARIL) 25 MG tablet Take 25 mg by mouth at bedtime as needed for anxiety or itching.   . lamoTRIgine (LAMICTAL) 150 MG tablet Take 1 tablet (150 mg total) by mouth at bedtime.  [name] levothyroxine (SYNTHROID, LEVOTHROID) 100 MCG tablet Take 100 mcg by mouth at bedtime.   [name] losartan (COZAAR)  50 MG tablet Take 50 mg by mouth at bedtime.   . meclizine (ANTIVERT) 25 MG tablet Take 1 tablet (25 mg total) by mouth 3 (three) times daily as needed for dizziness.  [name] MELATONIN PO Take 1 tablet by mouth at bedtime as needed (for sleep).  . metFORMIN (GLUCOPHAGE) 500 MG tablet Take 2 tablets (1,000 mg total) by mouth 2 (two) times daily with a meal.  . metroNIDAZOLE (METROGEL) 0.75 % gel Apply 1 application topically as needed.  . mupirocin ointment (BACTROBAN) 2 % APPLY AS DIRECTED TWICE DAILY  . nystatin-triamcinolone ointment (MYCOLOG) APPLY TWICE DAILY  . omeprazole (PRILOSEC) 40 MG capsule TAKE ONE CAPSULE BY MOUTH ONCE DAILY  . pravastatin (PRAVACHOL) 20 MG tablet Take 20 mg by mouth daily.  . promethazine (PHENERGAN) 25 MG tablet Take 12.5-25 mg by mouth every 6 (six) hours as needed. 1/2 tablet twice daily as needed  . traMADol (ULTRAM) 50 MG tablet Take 2 tablets (100 mg total) by mouth every 8 (eight) hours  as needed for pain.  [name] venlafaxine XR (EFFEXOR-XR) 150 MG 24 hr capsule Take 2 capsules (300 mg total) by mouth at bedtime.  . [DISCONTINUED] canagliflozin (INVOKANA) 100 MG TABS tablet Take 100 mg by mouth daily.   No facility-administered encounter medications on file as of 09/27/2015.   ALLERGIES: Allergies  Allergen Reactions  . Amoxicillin Anaphylaxis  . Hydrocodone Anaphylaxis  . Primidone Anaphylaxis  . Aspirin   . Depacon [Valproic Acid]     Causes falls   . Lisinopril     Dr took off as precaution  . Peanuts [Peanut Oil]     All nuts  . Shellfish Allergy    VACCINATION STATUS:  There is no immunization history on file for this patient.  Diabetes She presents for her follow-up diabetic visit. She has type 2 diabetes mellitus. Onset time: She was diagnosed at approximate age of 89 years. Her disease course has been improving. There are no hypoglycemic associated symptoms. Pertinent negatives for hypoglycemia include no confusion, headaches, pallor or seizures. Associated symptoms include fatigue, polydipsia and polyuria. Pertinent negatives for diabetes include no chest pain and no polyphagia. There are no hypoglycemic complications. Symptoms are improving. There are no diabetic complications. Risk factors for coronary artery disease include diabetes mellitus, dyslipidemia, hypertension, obesity and sedentary lifestyle. Current diabetic treatment includes oral agent (dual therapy). She is compliant with treatment most of the time. Her weight is decreasing steadily. She is following a generally unhealthy diet. She has not had a previous visit with a dietitian. She never participates in exercise. An ACE inhibitor/angiotensin II receptor blocker is being taken. Eye exam is current.  Hypertension This is a chronic problem. The current episode started more than 1 year ago. Pertinent negatives include no chest pain, headaches, palpitations or shortness of breath. Past treatments include  ACE inhibitors. Compliance problems include diet, exercise, medication cost, medication side effects and psychosocial issues.  Hypertensive end-organ damage includes a thyroid problem.  Hyperlipidemia This is a chronic problem. The current episode started more than 1 year ago. Pertinent negatives include no chest pain, myalgias or shortness of breath. Current antihyperlipidemic treatment includes statins. Risk factors for coronary artery disease include a sedentary lifestyle, obesity, hypertension, diabetes mellitus and dyslipidemia.  Thyroid Problem Presents for follow-up visit. Symptoms include fatigue. Patient reports no cold intolerance, diarrhea, heat intolerance or palpitations. The symptoms have been stable. Past treatments include levothyroxine. Her past medical history is significant for hyperlipidemia.     Review  of Systems  Constitutional: Positive for fatigue. Negative for unexpected weight change.  HENT: Negative for trouble swallowing and voice change.   Eyes: Negative for visual disturbance.  Respiratory: Negative for cough, shortness of breath and wheezing.   Cardiovascular: Negative for chest pain, palpitations and leg swelling.  Gastrointestinal: Negative for nausea, vomiting and diarrhea.  Endocrine: Positive for polydipsia and polyuria. Negative for cold intolerance, heat intolerance and polyphagia.  Musculoskeletal: Negative for myalgias and arthralgias.  Skin: Negative for color change, pallor, rash and wound.  Neurological: Negative for seizures and headaches.  Psychiatric/Behavioral: Negative for suicidal ideas and confusion.    Objective:    BP 148/81 mmHg  Pulse 108  Ht 5\' 5"  (1.651 m)  Wt 258 lb (117.028 kg)  BMI 42.93 kg/m2  Wt Readings from Last 3 Encounters:  09/27/15 258 lb (117.028 kg)  09/26/15 258 lb 9.6 oz (117.3 kg)  07/14/15 263 lb (119.296 kg)    Physical Exam  Constitutional: She is oriented to person, place, and time. She appears  well-developed.  HENT:  Head: Normocephalic and atraumatic.  Eyes: EOM are normal.  Neck: Normal range of motion. Neck supple. No tracheal deviation present. No thyromegaly present.  Cardiovascular: Normal rate and regular rhythm.   Pulmonary/Chest: Effort normal and breath sounds normal.  Abdominal: Soft. Bowel sounds are normal. There is no tenderness. There is no guarding.  Musculoskeletal: Normal range of motion. She exhibits no edema.  Neurological: She is alert and oriented to person, place, and time. She has normal reflexes. No cranial nerve deficit. Coordination normal.  Skin: Skin is warm and dry. No rash noted. No erythema. No pallor.  Psychiatric: She has a normal mood and affect. Judgment normal.    Results for orders placed or performed in visit on 09/27/15  Hemoglobin A1c  Result Value Ref Range   Hemoglobin A1C 7.8    Complete Blood Count (Most recent): No results found for: WBC, HGB, HCT, MCV, PLT Chemistry (most recent): Lab Results  Component Value Date   CREATININE 1.10 02/05/2011   Diabetic Labs (most recent): Lab Results  Component Value Date   HGBA1C 7.8 09/07/2015   HGBA1C 7.9* 05/12/2015   HGBA1C 8.3* 02/08/2015    Assessment & Plan:   1. Uncontrolled type 2 diabetes mellitus with complication, without long-term current use of insulin (HCC)  Her diabetes is  complicated by neuropathy and patient remains at a high risk for more acute and chronic complications of diabetes which include CAD, CVA, CKD, retinopathy, and neuropathy. These are all discussed in detail with the patient.  Patient came with  Improved A1c of 7.8% from  8.3 %.  - I have re-counseled the patient on diet management and weight loss  by adopting a carbohydrate restricted / protein rich  Diet.  - Suggestion is made for patient to avoid simple carbohydrates   from their diet including Cakes , Desserts, Ice Cream,  Soda (  diet and regular) , Sweet Tea , Candies,  Chips, Cookies,  Artificial Sweeteners,   and "Sugar-free" Products .  This will help patient to have stable blood glucose profile and potentially avoid unintended  Weight gain.  - Patient is advised to stick to a routine mealtimes to eat 3 meals  a day and avoid unnecessary snacks ( to snack only to correct hypoglycemia).  - The patient  will be  scheduled with Jearld Fenton, RDN, CDE for individualized DM education.  - I have approached patient with the following individualized plan  to manage diabetes and patient agrees.  -For better insulin sensitivity I will continue MTF 1gm PO BID, increase Invokana to 300mg  po qam.   - Patient will be considered for incretin therapy as appropriate next visit. - Patient specific target  for A1c; LDL, HDL, Triglycerides, and  Waist Circumference were discussed in detail.  2) BP/HTN: Controlled. Continue current medications including ACEI/ARB. 3) Lipids/HPL:  continue statins. 4)  Weight/Diet: CDE consult in progress, exercise, and carbohydrates information provided.  5) Hypothyroidism, unspecified hypothyroidism type   - Her thyroid function tests are consistent with appropriate replacement.  I advised her to continue levothyroxine 100 g by mouth every morning.  - We discussed about correct intake of levothyroxine, at fasting, with water, separated by at least 30 minutes from breakfast, and separated by more than 4 hours from calcium, iron, multivitamins, acid reflux medications (PPIs). -Patient is made aware of the fact that thyroid hormone replacement is needed for life, dose to be adjusted by periodic monitoring of thyroid function tests.  6) Chronic Care/Health Maintenance:  -Patient is on ACEI/ARB and Statin medications and encouraged to continue to follow up with Ophthalmology, Podiatrist at least yearly or according to recommendations, and advised to  stay away from smoking. I have recommended yearly flu vaccine and pneumonia vaccination at least every 5 years;  moderate intensity exercise for up to 150 minutes weekly; and  sleep for at least 7 hours a day.  I advised patient to maintain close follow up with their PCP for primary care needs.  Patient is asked to bring meter and  blood glucose logs during their next visit.   Follow up plan: Return in about 3 months (around 12/28/2015) for diabetes, high blood pressure, high cholesterol, underactive thyroid, follow up with pre-visit labs.  Glade Lloyd, MD Phone: [phone]  Fax: [phone]   09/27/2015, 3:20 PM

## 2015-09-27 NOTE — Patient Instructions (Signed)
Advice for weight management -For most of us the best way to lose weight is by diet management. Generally speaking, diet management means restricting carbohydrate consumption to minimum possible (and to unprocessed or minimally processed complex starch) and increasing protein intake (animal or plant source), fruits, and vegetables.  -Sticking to a routine mealtime to eat 3 meals a day and avoiding unnecessary snacks is shown to have a big role in weight control.  -It is better to avoid simple carbohydrates including: Cakes, Desserts, Ice Cream, Soda (diet and regular), Sweet Tea, Candies, Chips, Cookies, Artificial Sweeteners, and "Sugar-free" Products.   -Exercise: 30 minutes a day 3-4 days a week, or 150 minutes a week. Combine stretch, strength, and aerobic activities. You may seek evaluation by your heart doctor prior to initiating exercise if you have high risk for heart disease.  -If you are interested, we can schedule a visit with Norma [name], RDN, CDE for individualized nutrition education.  

## 2015-10-02 DIAGNOSIS — M1712 Unilateral primary osteoarthritis, left knee: Secondary | ICD-10-CM | POA: Diagnosis not present

## 2015-10-02 DIAGNOSIS — M25562 Pain in left knee: Secondary | ICD-10-CM | POA: Diagnosis not present

## 2015-10-10 DIAGNOSIS — M25561 Pain in right knee: Secondary | ICD-10-CM | POA: Diagnosis not present

## 2015-10-10 DIAGNOSIS — M1711 Unilateral primary osteoarthritis, right knee: Secondary | ICD-10-CM | POA: Diagnosis not present

## 2015-10-18 ENCOUNTER — Other Ambulatory Visit (HOSPITAL_COMMUNITY): Payer: Self-pay | Admitting: Psychiatry

## 2015-10-19 DIAGNOSIS — M25562 Pain in left knee: Secondary | ICD-10-CM | POA: Diagnosis not present

## 2015-10-19 DIAGNOSIS — M1712 Unilateral primary osteoarthritis, left knee: Secondary | ICD-10-CM | POA: Diagnosis not present

## 2015-10-25 DIAGNOSIS — M170 Bilateral primary osteoarthritis of knee: Secondary | ICD-10-CM | POA: Diagnosis not present

## 2015-10-25 DIAGNOSIS — M25562 Pain in left knee: Secondary | ICD-10-CM | POA: Diagnosis not present

## 2015-10-25 DIAGNOSIS — R262 Difficulty in walking, not elsewhere classified: Secondary | ICD-10-CM | POA: Diagnosis not present

## 2015-10-25 DIAGNOSIS — M1712 Unilateral primary osteoarthritis, left knee: Secondary | ICD-10-CM | POA: Diagnosis not present

## 2015-10-25 DIAGNOSIS — M25561 Pain in right knee: Secondary | ICD-10-CM | POA: Diagnosis not present

## 2015-11-01 DIAGNOSIS — M1711 Unilateral primary osteoarthritis, right knee: Secondary | ICD-10-CM | POA: Diagnosis not present

## 2015-11-01 DIAGNOSIS — M25561 Pain in right knee: Secondary | ICD-10-CM | POA: Diagnosis not present

## 2015-11-07 ENCOUNTER — Ambulatory Visit (INDEPENDENT_AMBULATORY_CARE_PROVIDER_SITE_OTHER): Payer: PPO | Admitting: Internal Medicine

## 2015-11-08 DIAGNOSIS — M25562 Pain in left knee: Secondary | ICD-10-CM | POA: Diagnosis not present

## 2015-11-08 DIAGNOSIS — M170 Bilateral primary osteoarthritis of knee: Secondary | ICD-10-CM | POA: Diagnosis not present

## 2015-11-08 DIAGNOSIS — M25561 Pain in right knee: Secondary | ICD-10-CM | POA: Diagnosis not present

## 2015-11-17 DIAGNOSIS — G4733 Obstructive sleep apnea (adult) (pediatric): Secondary | ICD-10-CM | POA: Diagnosis not present

## 2015-11-28 DIAGNOSIS — K210 Gastro-esophageal reflux disease with esophagitis: Secondary | ICD-10-CM | POA: Diagnosis not present

## 2015-11-28 DIAGNOSIS — J449 Chronic obstructive pulmonary disease, unspecified: Secondary | ICD-10-CM | POA: Diagnosis not present

## 2015-11-28 DIAGNOSIS — F319 Bipolar disorder, unspecified: Secondary | ICD-10-CM | POA: Diagnosis not present

## 2015-11-28 DIAGNOSIS — E1165 Type 2 diabetes mellitus with hyperglycemia: Secondary | ICD-10-CM | POA: Diagnosis not present

## 2015-11-29 ENCOUNTER — Other Ambulatory Visit (HOSPITAL_COMMUNITY): Payer: Self-pay | Admitting: Pulmonary Disease

## 2015-11-29 DIAGNOSIS — Z1231 Encounter for screening mammogram for malignant neoplasm of breast: Secondary | ICD-10-CM

## 2015-12-07 ENCOUNTER — Ambulatory Visit (HOSPITAL_COMMUNITY): Payer: Self-pay

## 2015-12-12 ENCOUNTER — Encounter (INDEPENDENT_AMBULATORY_CARE_PROVIDER_SITE_OTHER): Payer: Self-pay | Admitting: Internal Medicine

## 2015-12-12 ENCOUNTER — Ambulatory Visit (INDEPENDENT_AMBULATORY_CARE_PROVIDER_SITE_OTHER): Payer: PPO | Admitting: Internal Medicine

## 2015-12-12 VITALS — BP 128/64 | HR 68 | Temp 98.1°F | Resp 18 | Ht 64.0 in | Wt 256.4 lb

## 2015-12-12 DIAGNOSIS — R079 Chest pain, unspecified: Secondary | ICD-10-CM | POA: Diagnosis not present

## 2015-12-12 DIAGNOSIS — K219 Gastro-esophageal reflux disease without esophagitis: Secondary | ICD-10-CM | POA: Diagnosis not present

## 2015-12-12 DIAGNOSIS — R110 Nausea: Secondary | ICD-10-CM | POA: Diagnosis not present

## 2015-12-12 NOTE — Progress Notes (Signed)
Presenting complaint;  Follow-up for GERD and nausea.  Subjective:  Norma [name] 65 year old Caucasian female who is here for scheduled visit. She was last seen on 05/09/2015. Se was complaniing of nocturnal nausea felt to be due to gastroparesis. She was advised to continue omeprazole and given prescription for domperidone which she took at bedtime. She also has history of mid abdominal pain and workup has been negative.  She states she tried domperidone for 3 months. While it did not alleviate his nausea she had excessive flatus. She says nausea has improved and she hasn't experienced it over the last 1 month. She now complains of chest pain which has been occurring daily for the last 3 weeks. Pain occurs at night. It may last for 2-3 minutes. This pain is not associated with diaphoresis or shortness of breath. She states she had cardiac evaluation and cardiac as 7 years ago and was normal. She does not have this pain during the daytime. She does not experience heartburn very often while on PPI. She says she is taking PPI at bedtime. She has not experienced abdominal pain since her last visit. Bowels move every other day. She denies melena or rectal bleeding. She has lateral knee pain. She has had multiple injections. She has lost 5 pounds since her last visit. She used to weigh 302 pounds in June 2012. She feels her walk weight loss is secondary to manage total intake.    Current Medications: Outpatient Encounter Prescriptions as of 12/12/2015  Medication Sig  . Aspirin-Acetaminophen-Caffeine (EXCEDRIN MIGRAINE PO) Take by mouth 2 (two) times daily. Patient takes this with her Tramadol.  . Biotin (HM BIOTIN) 09811 MCG TBDP Take 1 tablet by mouth daily.  [name] buPROPion (WELLBUTRIN XL) 300 MG 24 hr tablet TAKE ONE TABLET BY MOUTH EVERY MORNING.  . canagliflozin (INVOKANA) 300 MG TABS tablet Take 1 tablet (300 mg total) by mouth daily.  . clonazePAM (KLONOPIN) 0.5 MG tablet Take 1 tablet (0.5 mg total)  by mouth 2 (two) times daily.  . diclofenac sodium (VOLTAREN) 1 % GEL Apply topically as needed.  [name] EPINEPHrine (EPIPEN IJ) Inject as directed once. As needed  . fexofenadine (ALLEGRA) 180 MG tablet Take 180 mg by mouth daily as needed for allergies or rhinitis.  [name] gabapentin (NEURONTIN) 300 MG capsule One capsule twice a day and 2 capsules at night (Patient taking differently: 1,200 mg 2 (two) times daily. )  . hydrOXYzine (ATARAX/VISTARIL) 25 MG tablet Take 25 mg by mouth at bedtime as needed for anxiety or itching.   . lamoTRIgine (LAMICTAL) 150 MG tablet TAKE ONE TABLET BY MOUTH AT BEDTIME.  [name] levothyroxine (SYNTHROID, LEVOTHROID) 100 MCG tablet Take 100 mcg by mouth at bedtime.   [name] losartan (COZAAR) 50 MG tablet Take 50 mg by mouth at bedtime.   . meclizine (ANTIVERT) 25 MG tablet Take 1 tablet (25 mg total) by mouth 3 (three) times daily as needed for dizziness.  [name] MELATONIN PO Take 1 tablet by mouth at bedtime as needed (for sleep).  . metFORMIN (GLUCOPHAGE) 500 MG tablet Take 2 tablets (1,000 mg total) by mouth 2 (two) times daily with a meal.  . metroNIDAZOLE (METROGEL) 0.75 % gel Apply 1 application topically as needed.  . mupirocin ointment (BACTROBAN) 2 % APPLY AS DIRECTED TWICE DAILY  . nystatin-triamcinolone ointment (MYCOLOG) APPLY TWICE DAILY  . omeprazole (PRILOSEC) 40 MG capsule TAKE ONE CAPSULE BY MOUTH ONCE DAILY  . pravastatin (PRAVACHOL) 20 MG tablet Take 20 mg by mouth daily.  [name]  PROAIR HFA 108 (90 Base) MCG/ACT inhaler INHALE TWO PUFFS FOUR TIMES DAILY  . promethazine (PHENERGAN) 25 MG tablet Take 12.5-25 mg by mouth every 6 (six) hours as needed. 1/2 tablet twice daily as needed  . traMADol (ULTRAM) 50 MG tablet Take 2 tablets (100 mg total) by mouth every 8 (eight) hours as needed for pain.  [name] venlafaxine XR (EFFEXOR-XR) 150 MG 24 hr capsule TAKE TWO (2) CAPSULES BY MOUTH AT BEDTIME.   No facility-administered encounter medications on file as of 12/12/2015.       Objective: Blood pressure 128/64, pulse 68, temperature 98.1 F (36.7 C), temperature source Oral, resp. rate 18, height 5\' 5"  (1.651 m), weight 256 lb 6.4 oz (116.3 kg). Patient is alert and in no acute distress. Conjunctiva is pink. Sclera is nonicteric Oropharyngeal mucosa is dry otherwise normal. No neck masses or thyromegaly noted. Cardiac exam with regular rhythm normal S1 and S2. Faint SEM noted at left sternal border. Lungs are clear to auscultation. Abdomen is full. She has 2 superficial ulcers below the level of umbilicus one in each side of the midline covered with dressing. Abdomen is soft and nontender without organomegaly or masses. No LE edema or clubbing noted. She has a soft subcutaneous mass size of small tangerine below her left lateral malleolus consistent with lipoma.    Assessment:  #1. Nausea most likely secondary to gastroparesis ease. She had side effects with domperidone but apparently did not help with nausea which is now gone. #2.  Atypical chest pain most likely secondary to gastroesophageal reflux disease. However she should talk with Dr. Luan Pulling and determine whether or not she should undergo cardiac evaluation. #3. GERD. While she is not having typical symptoms in the form of heartburn and chest pain may be secondary to GERD. She is not taking omeprazole correctly.   Plan:  Patient advised to take omeprazole 30 minutes before evening meal. She can take Pepcid OTC at bedtime as needed for breakthrough heartburn. Patient will call with progress report in 2 weeks. Unless chest pain resolves she should follow-up with Dr. Luan Pulling. Office visit in one year.

## 2015-12-12 NOTE — Patient Instructions (Addendum)
Remember to take Omeprazole or Prilosec by mouth 30 minutes before evening meal daily. Can take Pepcid OTC 20 mg at bedtime as needed for breakthrough heartburn. Please call office with progress report in 2 weeks.

## 2015-12-16 ENCOUNTER — Other Ambulatory Visit (HOSPITAL_COMMUNITY): Payer: Self-pay | Admitting: Psychiatry

## 2015-12-16 ENCOUNTER — Other Ambulatory Visit: Payer: Self-pay | Admitting: Neurology

## 2015-12-25 DIAGNOSIS — E118 Type 2 diabetes mellitus with unspecified complications: Secondary | ICD-10-CM | POA: Diagnosis not present

## 2015-12-25 DIAGNOSIS — E1165 Type 2 diabetes mellitus with hyperglycemia: Secondary | ICD-10-CM | POA: Diagnosis not present

## 2015-12-25 LAB — HEMOGLOBIN A1C: HEMOGLOBIN A1C: 9

## 2015-12-27 ENCOUNTER — Telehealth (INDEPENDENT_AMBULATORY_CARE_PROVIDER_SITE_OTHER): Payer: Self-pay | Admitting: Internal Medicine

## 2015-12-27 NOTE — Telephone Encounter (Signed)
Patient called, stated that her heartburn is better, but Prilosec sometimes works, but Personal assistant off before the next dose is due.  [phone]

## 2015-12-28 ENCOUNTER — Ambulatory Visit (INDEPENDENT_AMBULATORY_CARE_PROVIDER_SITE_OTHER): Payer: PPO | Admitting: "Endocrinology

## 2015-12-28 ENCOUNTER — Encounter: Payer: Self-pay | Admitting: "Endocrinology

## 2015-12-28 VITALS — BP 128/76 | HR 80 | Wt 254.0 lb

## 2015-12-28 DIAGNOSIS — E1165 Type 2 diabetes mellitus with hyperglycemia: Secondary | ICD-10-CM

## 2015-12-28 DIAGNOSIS — I10 Essential (primary) hypertension: Secondary | ICD-10-CM

## 2015-12-28 DIAGNOSIS — IMO0002 Reserved for concepts with insufficient information to code with codable children: Secondary | ICD-10-CM

## 2015-12-28 DIAGNOSIS — E118 Type 2 diabetes mellitus with unspecified complications: Secondary | ICD-10-CM

## 2015-12-28 DIAGNOSIS — E039 Hypothyroidism, unspecified: Secondary | ICD-10-CM

## 2015-12-28 DIAGNOSIS — E785 Hyperlipidemia, unspecified: Secondary | ICD-10-CM

## 2015-12-28 NOTE — Progress Notes (Signed)
Subjective:    Patient ID: Norma [name], female    DOB: 08-06-1950,    Past Medical History:  Diagnosis Date  . Anemia   . Bipolar disorder ([place])   . Bulging lumbar disc    L3-4  . Chronic daily headache   . Chronic low back pain 09/20/2014  . COPD (chronic obstructive pulmonary disease) ([place])   . Degenerative arthritis   . Depression   . Diabetes mellitus   . Diabetes mellitus, type II ([place])   . DM type 2 with diabetic peripheral neuropathy ([place]) 09/20/2014  . Dyslipidemia   . Gastroparesis   . GERD (gastroesophageal reflux disease)   . Hypertension   . Hypothyroidism   . IBS (irritable bowel syndrome)   . Memory difficulty 09/20/2014  . Morbid obesity ([place])   . Neuropathy ([place])   . Obstructive sleep apnea on CPAP   . Restless legs syndrome (RLS) 09/17/2012   Past Surgical History:  Procedure Laterality Date  . ABDOMINAL HYSTERECTOMY    . ARTHROSCOPY KNEE W/ DRILLING  06/2011   and Decemer of 2013.  [name] Carpel tunnel  1980's  . CHOLECYSTECTOMY    . lesions     Removed from tongue  . RECTAL SURGERY     fissure  . SHOULDER SURGERY     arthroscopy in March of this year   Social History   Social History  . Marital status: Single    Spouse name: N/A  . Number of children: 0  . Years of education: 7   Occupational History  .  [employer]   Social History Main Topics  . Smoking status: Former Smoker    Types: Cigarettes    Quit date: 02/04/1971  . Smokeless tobacco: Never Used     Comment: smoked 2 cigarettes a day  . Alcohol use No  . Drug use: No  . Sexual activity: No   Other Topics Concern  . Not on file   Social History Narrative   Patient lives at home alone.    Patient has no children.    Patient has her masters in nursing.    Patient is single.    Patient drinks about 2 glasses of tea daily.   Patient is right handed.   Outpatient Encounter Prescriptions as of 12/28/2015  Medication Sig  . Aspirin-Acetaminophen-Caffeine  (EXCEDRIN MIGRAINE PO) Take by mouth 2 (two) times daily. Patient takes this with her Tramadol.  . Biotin (HM BIOTIN) 09811 MCG TBDP Take 1 tablet by mouth daily.  [name] buPROPion (WELLBUTRIN XL) 300 MG 24 hr tablet TAKE ONE TABLET BY MOUTH EVERY MORNING.  . canagliflozin (INVOKANA) 300 MG TABS tablet Take 1 tablet (300 mg total) by mouth daily.  . clonazePAM (KLONOPIN) 0.5 MG tablet Take 1 tablet (0.5 mg total) by mouth 2 (two) times daily.  . diclofenac sodium (VOLTAREN) 1 % GEL Apply topically as needed.  [name] EPINEPHrine (EPIPEN IJ) Inject as directed once. As needed  . fexofenadine (ALLEGRA) 180 MG tablet Take 180 mg by mouth daily as needed for allergies or rhinitis.  [name] gabapentin (NEURONTIN) 300 MG capsule One capsule twice a day and 2 capsules at night (Patient taking differently: 1,200 mg 2 (two) times daily. )  . lamoTRIgine (LAMICTAL) 150 MG tablet TAKE ONE TABLET BY MOUTH AT BEDTIME.  [name] levothyroxine (SYNTHROID, LEVOTHROID) 100 MCG tablet Take 100 mcg by mouth at bedtime.   [name] losartan (COZAAR) 50 MG tablet Take 50 mg by mouth at  bedtime.   . meclizine (ANTIVERT) 25 MG tablet Take 1 tablet (25 mg total) by mouth 3 (three) times daily as needed for dizziness.  [name] MELATONIN PO Take 1 tablet by mouth at bedtime as needed (for sleep).  . metFORMIN (GLUCOPHAGE) 500 MG tablet Take 2 tablets (1,000 mg total) by mouth 2 (two) times daily with a meal.  . metroNIDAZOLE (METROGEL) 0.75 % gel Apply 1 application topically as needed.  . mupirocin ointment (BACTROBAN) 2 % APPLY AS DIRECTED TWICE DAILY  . nystatin-triamcinolone ointment (MYCOLOG) APPLY TWICE DAILY  . omeprazole (PRILOSEC) 40 MG capsule TAKE ONE CAPSULE BY MOUTH ONCE DAILY  . pravastatin (PRAVACHOL) 20 MG tablet Take 20 mg by mouth daily.  [name] PROAIR HFA 108 (90 Base) MCG/ACT inhaler INHALE TWO PUFFS FOUR TIMES DAILY  . promethazine (PHENERGAN) 25 MG tablet Take 12.5-25 mg by mouth every 6 (six) hours as needed. 1/2 tablet twice daily as  needed  . traMADol (ULTRAM) 50 MG tablet Take 2 tablets (100 mg total) by mouth every 8 (eight) hours as needed for pain.  [name] venlafaxine XR (EFFEXOR-XR) 150 MG 24 hr capsule TAKE TWO (2) CAPSULES BY MOUTH AT BEDTIME.   No facility-administered encounter medications on file as of 12/28/2015.    ALLERGIES: Allergies  Allergen Reactions  . Amoxicillin Anaphylaxis  . Hydrocodone Anaphylaxis  . Primidone Anaphylaxis  . Aspirin   . Depacon [Valproic Acid]     Causes falls   . Lisinopril     Dr took off as precaution  . Peanuts [Peanut Oil]     All nuts  . Shellfish Allergy    VACCINATION STATUS:  There is no immunization history on file for this patient.  Diabetes  She presents for her follow-up diabetic visit. She has type 2 diabetes mellitus. Onset time: She was diagnosed at approximate age of 43 years. Her disease course has been worsening. There are no hypoglycemic associated symptoms. Pertinent negatives for hypoglycemia include no confusion, headaches, pallor or seizures. Associated symptoms include fatigue, polydipsia and polyuria. Pertinent negatives for diabetes include no chest pain and no polyphagia. There are no hypoglycemic complications. Symptoms are worsening. There are no diabetic complications. Risk factors for coronary artery disease include diabetes mellitus, dyslipidemia, hypertension, obesity and sedentary lifestyle. Current diabetic treatment includes oral agent (dual therapy). She is compliant with treatment most of the time. Her weight is stable. She is following a generally unhealthy diet. She has not had a previous visit with a dietitian. She never participates in exercise. An ACE inhibitor/angiotensin II receptor blocker is being taken. Eye exam is current.  Hypertension  This is a chronic problem. The current episode started more than 1 year ago. Pertinent negatives include no chest pain, headaches, palpitations or shortness of breath. Past treatments include ACE  inhibitors. Compliance problems include diet, exercise, medication cost, medication side effects and psychosocial issues.  Hypertensive end-organ damage includes a thyroid problem.  Hyperlipidemia  This is a chronic problem. The current episode started more than 1 year ago. Pertinent negatives include no chest pain, myalgias or shortness of breath. Current antihyperlipidemic treatment includes statins. Risk factors for coronary artery disease include a sedentary lifestyle, obesity, hypertension, diabetes mellitus and dyslipidemia.  Thyroid Problem  Presents for follow-up visit. Symptoms include fatigue. Patient reports no cold intolerance, diarrhea, heat intolerance or palpitations. The symptoms have been stable. Past treatments include levothyroxine. Her past medical history is significant for hyperlipidemia.     Review of Systems  Constitutional: Positive for fatigue.  Negative for unexpected weight change.  HENT: Negative for trouble swallowing and voice change.   Eyes: Negative for visual disturbance.  Respiratory: Negative for cough, shortness of breath and wheezing.   Cardiovascular: Negative for chest pain, palpitations and leg swelling.  Gastrointestinal: Negative for diarrhea, nausea and vomiting.  Endocrine: Positive for polydipsia and polyuria. Negative for cold intolerance, heat intolerance and polyphagia.  Musculoskeletal: Negative for arthralgias and myalgias.  Skin: Negative for color change, pallor, rash and wound.  Neurological: Negative for seizures and headaches.  Psychiatric/Behavioral: Negative for confusion and suicidal ideas.    Objective:    BP 128/76   Pulse 80   Wt 254 lb (115.2 kg)   BMI 43.60 kg/m   Wt Readings from Last 3 Encounters:  12/28/15 254 lb (115.2 kg)  12/12/15 256 lb 6.4 oz (116.3 kg)  09/27/15 258 lb (117 kg)    Physical Exam  Constitutional: She is oriented to person, place, and time. She appears well-developed.  HENT:  Head: Normocephalic  and atraumatic.  Eyes: EOM are normal.  Neck: Normal range of motion. Neck supple. No tracheal deviation present. No thyromegaly present.  Cardiovascular: Normal rate and regular rhythm.   Pulmonary/Chest: Effort normal and breath sounds normal.  Abdominal: Soft. Bowel sounds are normal. There is no tenderness. There is no guarding.  Musculoskeletal: Normal range of motion. She exhibits no edema.  Neurological: She is alert and oriented to person, place, and time. She has normal reflexes. No cranial nerve deficit. Coordination normal.  Skin: Skin is warm and dry. No rash noted. No erythema. No pallor.  Psychiatric: She has a normal mood and affect. Judgment normal.    Results for orders placed or performed in visit on 12/28/15  Hemoglobin A1c  Result Value Ref Range   Hemoglobin A1C 9    Complete Blood Count (Most recent): No results found for: WBC, HGB, HCT, MCV, PLT Chemistry (most recent): Lab Results  Component Value Date   CREATININE 1.10 02/05/2011   Diabetic Labs (most recent): Lab Results  Component Value Date   HGBA1C 9 12/25/2015   HGBA1C 7.8 09/07/2015   HGBA1C 7.9 (H) 05/12/2015    Assessment & Plan:   1. Uncontrolled type 2 diabetes mellitus with complication, without long-term current use of insulin (HCC)  Her diabetes is  complicated by neuropathy and patient remains at a high risk for more acute and chronic complications of diabetes which include CAD, CVA, CKD, retinopathy, and neuropathy. These are all discussed in detail with the patient.  Patient came with Loss of control of her diabetes with A1c rising to 9% from 7.8%.  - I have re-counseled the patient on diet management and weight loss  by adopting a carbohydrate restricted / protein rich  Diet.  - Suggestion is made for patient to avoid simple carbohydrates   from their diet including Cakes , Desserts, Ice Cream,  Soda (  diet and regular) , Sweet Tea , Candies,  Chips, Cookies, Artificial Sweeteners,    and "Sugar-free" Products .  This will help patient to have stable blood glucose profile and potentially avoid unintended  Weight gain.  - Patient is advised to stick to a routine mealtimes to eat 3 meals  a day and avoid unnecessary snacks ( to snack only to correct hypoglycemia).  - The patient  will be  scheduled with Jearld Fenton, RDN, CDE for individualized DM education.  - I have approached patient with the following individualized plan to manage diabetes and patient  agrees.  - She will likely require insulin treatment for her diabetes.  -However, it is essential to assure her commitment for monitoring before initiating. -I will proceed for her  to initiate monitoring of blood glucose before meals and at bedtime for a week and she'll bring her meter and logs to review with me. - Based on her blood glucose profile, she will be approached for insulin treatment. In the meantime, I will continue on metformin 1000 mg by mouth twice a day and  Invokana to 300mg  po qam.   - Patient will be considered for incretin therapy as appropriate next visit. - Patient specific target  for A1c; LDL, HDL, Triglycerides, and  Waist Circumference were discussed in detail.  2) BP/HTN: Controlled. Continue current medications including ACEI/ARB. 3) Lipids/HPL:  continue statins. 4)  Weight/Diet: CDE consult in progress, exercise, and carbohydrates information provided.  5) Hypothyroidism, unspecified hypothyroidism type   - Her thyroid function tests are consistent with appropriate replacement.  I advised her to continue levothyroxine 100 g by mouth every morning.  - We discussed about correct intake of levothyroxine, at fasting, with water, separated by at least 30 minutes from breakfast, and separated by more than 4 hours from calcium, iron, multivitamins, acid reflux medications (PPIs). -Patient is made aware of the fact that thyroid hormone replacement is needed for life, dose to be adjusted by  periodic monitoring of thyroid function tests.  6) Chronic Care/Health Maintenance:  -Patient is on ACEI/ARB and Statin medications and encouraged to continue to follow up with Ophthalmology, Podiatrist at least yearly or according to recommendations, and advised to  stay away from smoking. I have recommended yearly flu vaccine and pneumonia vaccination at least every 5 years; moderate intensity exercise for up to 150 minutes weekly; and  sleep for at least 7 hours a day.  I advised patient to maintain close follow up with their PCP for primary care needs.  Patient is asked to bring meter and  blood glucose logs during their next visit.   Follow up plan: Return in about 1 week (around 01/04/2016) for follow up with meter and logs- no labs.  Glade Lloyd, MD Phone: [phone]  Fax: [phone]   12/28/2015, 2:33 PM

## 2015-12-28 NOTE — Telephone Encounter (Signed)
Per Dr.Rehman the patient may take Omeprazole 40 mg before evening meal. If this does not work, may try [employer]. Patient was called and message was left on her voicemail with Dr.Rehman's recommendation.

## 2015-12-29 ENCOUNTER — Other Ambulatory Visit (INDEPENDENT_AMBULATORY_CARE_PROVIDER_SITE_OTHER): Payer: Self-pay | Admitting: *Deleted

## 2015-12-29 DIAGNOSIS — K219 Gastro-esophageal reflux disease without esophagitis: Secondary | ICD-10-CM

## 2015-12-29 MED ORDER — DEXLANSOPRAZOLE 60 MG PO CPDR: 60.0000 mg | DELAYED_RELEASE_CAPSULE | Freq: Every day | ORAL | 3 refills | Status: DC

## 2015-12-29 NOTE — Telephone Encounter (Signed)
Patient has been on Omeprazole 40 mg for a while and she has used Prilosec OTC for breakthrough pain. This is not working for her. Per Dr.Rehman patient may try Dexilant 60 mg Take 1 by mouth at bedtime.  Insurance recommends #90 be dispensed. Patient was called and made aware.

## 2016-01-01 ENCOUNTER — Ambulatory Visit: Payer: PPO | Admitting: Nurse Practitioner

## 2016-01-04 ENCOUNTER — Other Ambulatory Visit: Payer: Self-pay | Admitting: Neurology

## 2016-01-05 NOTE — Telephone Encounter (Signed)
Rx printed, signed, faxed to pharmacy. 

## 2016-01-08 ENCOUNTER — Ambulatory Visit: Payer: PPO | Admitting: "Endocrinology

## 2016-01-11 ENCOUNTER — Encounter: Payer: Self-pay | Admitting: "Endocrinology

## 2016-01-11 ENCOUNTER — Ambulatory Visit (INDEPENDENT_AMBULATORY_CARE_PROVIDER_SITE_OTHER): Payer: PPO | Admitting: "Endocrinology

## 2016-01-11 VITALS — BP 131/64 | HR 105 | Ht 64.0 in | Wt 252.0 lb

## 2016-01-11 DIAGNOSIS — E118 Type 2 diabetes mellitus with unspecified complications: Secondary | ICD-10-CM

## 2016-01-11 DIAGNOSIS — E785 Hyperlipidemia, unspecified: Secondary | ICD-10-CM

## 2016-01-11 DIAGNOSIS — E1165 Type 2 diabetes mellitus with hyperglycemia: Secondary | ICD-10-CM

## 2016-01-11 DIAGNOSIS — I10 Essential (primary) hypertension: Secondary | ICD-10-CM

## 2016-01-11 DIAGNOSIS — E038 Other specified hypothyroidism: Secondary | ICD-10-CM | POA: Diagnosis not present

## 2016-01-11 DIAGNOSIS — Z794 Long term (current) use of insulin: Secondary | ICD-10-CM

## 2016-01-11 DIAGNOSIS — IMO0002 Reserved for concepts with insufficient information to code with codable children: Secondary | ICD-10-CM

## 2016-01-11 MED ORDER — INSULIN GLARGINE 300 UNIT/ML ~~LOC~~ SOPN: 20.0000 [IU] | PEN_INJECTOR | Freq: Every day | SUBCUTANEOUS | 2 refills | Status: DC

## 2016-01-11 MED ORDER — CANAGLIFLOZIN 100 MG PO TABS: 100.0000 mg | ORAL_TABLET | Freq: Every day | ORAL | 3 refills | Status: DC

## 2016-01-11 NOTE — Progress Notes (Signed)
Subjective:    Patient ID: Norma [name], female    DOB: 03-Nov-1950,    Past Medical History:  Diagnosis Date  . Anemia   . Bipolar disorder ([place])   . Bulging lumbar disc    L3-4  . Chronic daily headache   . Chronic low back pain 09/20/2014  . COPD (chronic obstructive pulmonary disease) ([place])   . Degenerative arthritis   . Depression   . Diabetes mellitus   . Diabetes mellitus, type II ([place])   . DM type 2 with diabetic peripheral neuropathy ([place]) 09/20/2014  . Dyslipidemia   . Gastroparesis   . GERD (gastroesophageal reflux disease)   . Hypertension   . Hypothyroidism   . IBS (irritable bowel syndrome)   . Memory difficulty 09/20/2014  . Morbid obesity ([place])   . Neuropathy ([place])   . Obstructive sleep apnea on CPAP   . Restless legs syndrome (RLS) 09/17/2012   Past Surgical History:  Procedure Laterality Date  . ABDOMINAL HYSTERECTOMY    . ARTHROSCOPY KNEE W/ DRILLING  06/2011   and Decemer of 2013.  [name] Carpel tunnel  1980's  . CHOLECYSTECTOMY    . lesions     Removed from tongue  . RECTAL SURGERY     fissure  . SHOULDER SURGERY     arthroscopy in March of this year   Social History   Social History  . Marital status: Single    Spouse name: N/A  . Number of children: 0  . Years of education: 103   Occupational History  .  [employer]   Social History Main Topics  . Smoking status: Former Smoker    Types: Cigarettes    Quit date: 02/04/1971  . Smokeless tobacco: Never Used     Comment: smoked 2 cigarettes a day  . Alcohol use No  . Drug use: No  . Sexual activity: No   Other Topics Concern  . None   Social History Narrative   Patient lives at home alone.    Patient has no children.    Patient has her masters in nursing.    Patient is single.    Patient drinks about 2 glasses of tea daily.   Patient is right handed.   Outpatient Encounter Prescriptions as of 01/11/2016  Medication Sig  . Aspirin-Acetaminophen-Caffeine (EXCEDRIN  MIGRAINE PO) Take by mouth 2 (two) times daily. Patient takes this with her Tramadol.  . Biotin (HM BIOTIN) 28413 MCG TBDP Take 1 tablet by mouth daily.  [name] buPROPion (WELLBUTRIN XL) 300 MG 24 hr tablet TAKE ONE TABLET BY MOUTH EVERY MORNING.  . canagliflozin (INVOKANA) 100 MG TABS tablet Take 1 tablet (100 mg total) by mouth daily before breakfast.  . clonazePAM (KLONOPIN) 0.5 MG tablet TAKE ONE TABLET BY MOUTH TWICE DAILY.  [name] dexlansoprazole (DEXILANT) 60 MG capsule Take 1 capsule (60 mg total) by mouth daily.  . diclofenac sodium (VOLTAREN) 1 % GEL Apply topically as needed.  [name] EPINEPHrine (EPIPEN IJ) Inject as directed once. As needed  . fexofenadine (ALLEGRA) 180 MG tablet Take 180 mg by mouth daily as needed for allergies or rhinitis.  [name] gabapentin (NEURONTIN) 300 MG capsule One capsule twice a day and 2 capsules at night (Patient taking differently: 1,200 mg 2 (two) times daily. )  . Insulin Glargine (TOUJEO SOLOSTAR) 300 UNIT/ML SOPN Inject 20 Units into the skin at bedtime.  . lamoTRIgine (LAMICTAL) 150 MG tablet TAKE ONE TABLET BY MOUTH AT BEDTIME.  [name]  levothyroxine (SYNTHROID, LEVOTHROID) 100 MCG tablet Take 100 mcg by mouth at bedtime.   [name] losartan (COZAAR) 50 MG tablet Take 50 mg by mouth at bedtime.   . meclizine (ANTIVERT) 25 MG tablet Take 1 tablet (25 mg total) by mouth 3 (three) times daily as needed for dizziness.  [name] MELATONIN PO Take 1 tablet by mouth at bedtime as needed (for sleep).  . metFORMIN (GLUCOPHAGE) 500 MG tablet Take 2 tablets (1,000 mg total) by mouth 2 (two) times daily with a meal.  . metroNIDAZOLE (METROGEL) 0.75 % gel Apply 1 application topically as needed.  . mupirocin ointment (BACTROBAN) 2 % APPLY AS DIRECTED TWICE DAILY  . nystatin-triamcinolone ointment (MYCOLOG) APPLY TWICE DAILY  . pravastatin (PRAVACHOL) 20 MG tablet Take 20 mg by mouth daily.  [name] PROAIR HFA 108 (90 Base) MCG/ACT inhaler INHALE TWO PUFFS FOUR TIMES DAILY  . promethazine (PHENERGAN) 25  MG tablet Take 12.5-25 mg by mouth every 6 (six) hours as needed. 1/2 tablet twice daily as needed  . traMADol (ULTRAM) 50 MG tablet Take 2 tablets (100 mg total) by mouth every 8 (eight) hours as needed for pain.  [name] venlafaxine XR (EFFEXOR-XR) 150 MG 24 hr capsule TAKE TWO (2) CAPSULES BY MOUTH AT BEDTIME.  . [DISCONTINUED] canagliflozin (INVOKANA) 300 MG TABS tablet Take 1 tablet (300 mg total) by mouth daily.  . [DISCONTINUED] omeprazole (PRILOSEC) 40 MG capsule TAKE ONE CAPSULE BY MOUTH ONCE DAILY   No facility-administered encounter medications on file as of 01/11/2016.    ALLERGIES: Allergies  Allergen Reactions  . Amoxicillin Anaphylaxis  . Hydrocodone Anaphylaxis  . Primidone Anaphylaxis  . Aspirin   . Depacon [Valproic Acid]     Causes falls   . Lisinopril     Dr took off as precaution  . Peanuts [Peanut Oil]     All nuts  . Shellfish Allergy    VACCINATION STATUS:  There is no immunization history on file for this patient.  Diabetes  She presents for her follow-up diabetic visit. She has type 2 diabetes mellitus. Onset time: She was diagnosed at approximate age of 10 years. Her disease course has been worsening. There are no hypoglycemic associated symptoms. Pertinent negatives for hypoglycemia include no confusion, headaches, pallor or seizures. Associated symptoms include fatigue, polydipsia and polyuria. Pertinent negatives for diabetes include no chest pain and no polyphagia. There are no hypoglycemic complications. Symptoms are worsening. There are no diabetic complications. Risk factors for coronary artery disease include diabetes mellitus, dyslipidemia, hypertension, obesity and sedentary lifestyle. Current diabetic treatment includes oral agent (dual therapy). She is compliant with treatment most of the time. Her weight is stable. She is following a generally unhealthy diet. She has not had a previous visit with a dietitian. She never participates in exercise. An ACE  inhibitor/angiotensin II receptor blocker is being taken. Eye exam is current.  Hypertension  This is a chronic problem. The current episode started more than 1 year ago. Pertinent negatives include no chest pain, headaches, palpitations or shortness of breath. Past treatments include ACE inhibitors. Compliance problems include diet, exercise, medication cost, medication side effects and psychosocial issues.  Hypertensive end-organ damage includes a thyroid problem.  Hyperlipidemia  This is a chronic problem. The current episode started more than 1 year ago. Pertinent negatives include no chest pain, myalgias or shortness of breath. Current antihyperlipidemic treatment includes statins. Risk factors for coronary artery disease include a sedentary lifestyle, obesity, hypertension, diabetes mellitus and dyslipidemia.  Thyroid Problem  Presents for follow-up visit. Symptoms include fatigue. Patient reports no cold intolerance, diarrhea, heat intolerance or palpitations. The symptoms have been stable. Past treatments include levothyroxine. Her past medical history is significant for hyperlipidemia.     Review of Systems  Constitutional: Positive for fatigue. Negative for unexpected weight change.  HENT: Negative for trouble swallowing and voice change.   Eyes: Negative for visual disturbance.  Respiratory: Negative for cough, shortness of breath and wheezing.   Cardiovascular: Negative for chest pain, palpitations and leg swelling.  Gastrointestinal: Negative for diarrhea, nausea and vomiting.  Endocrine: Positive for polydipsia and polyuria. Negative for cold intolerance, heat intolerance and polyphagia.  Musculoskeletal: Negative for arthralgias and myalgias.  Skin: Negative for color change, pallor, rash and wound.  Neurological: Negative for seizures and headaches.  Psychiatric/Behavioral: Negative for confusion and suicidal ideas.    Objective:    BP 131/64   Pulse (!) 105   Ht 5\' 4"   (1.626 m)   Wt 252 lb (114.3 kg)   BMI 43.26 kg/m   Wt Readings from Last 3 Encounters:  01/11/16 252 lb (114.3 kg)  12/28/15 254 lb (115.2 kg)  12/12/15 256 lb 6.4 oz (116.3 kg)    Physical Exam  Constitutional: She is oriented to person, place, and time. She appears well-developed.  HENT:  Head: Normocephalic and atraumatic.  Eyes: EOM are normal.  Neck: Normal range of motion. Neck supple. No tracheal deviation present. No thyromegaly present.  Cardiovascular: Normal rate and regular rhythm.   Pulmonary/Chest: Effort normal and breath sounds normal.  Abdominal: Soft. Bowel sounds are normal. There is no tenderness. There is no guarding.  Musculoskeletal: Normal range of motion. She exhibits no edema.  Neurological: She is alert and oriented to person, place, and time. She has normal reflexes. No cranial nerve deficit. Coordination normal.  Skin: Skin is warm and dry. No rash noted. No erythema. No pallor.  Psychiatric: She has a normal mood and affect. Judgment normal.    Results for orders placed or performed in visit on 12/28/15  Hemoglobin A1c  Result Value Ref Range   Hemoglobin A1C 9    Complete Blood Count (Most recent): No results found for: WBC, HGB, HCT, MCV, PLT Chemistry (most recent): Lab Results  Component Value Date   CREATININE 1.10 02/05/2011   Diabetic Labs (most recent): Lab Results  Component Value Date   HGBA1C 9 12/25/2015   HGBA1C 7.8 09/07/2015   HGBA1C 7.9 (H) 05/12/2015    Assessment & Plan:   1. Uncontrolled type 2 diabetes mellitus with complication, without long-term current use of insulin (HCC)  Her diabetes is  complicated by neuropathy and patient remains at a high risk for more acute and chronic complications of diabetes which include CAD, CVA, CKD, retinopathy, and neuropathy. These are all discussed in detail with the patient.  Patient came with Loss of control of her diabetes with A1c rising to 9% from 7.8%.  - I have  re-counseled the patient on diet management and weight loss  by adopting a carbohydrate restricted / protein rich  Diet.  - Suggestion is made for patient to avoid simple carbohydrates   from their diet including Cakes , Desserts, Ice Cream,  Soda (  diet and regular) , Sweet Tea , Candies,  Chips, Cookies, Artificial Sweeteners,   and "Sugar-free" Products .  This will help patient to have stable blood glucose profile and potentially avoid unintended  Weight gain.  - Patient is advised to stick to a  routine mealtimes to eat 3 meals  a day and avoid unnecessary snacks ( to snack only to correct hypoglycemia).  - The patient  will be  scheduled with Jearld Fenton, RDN, CDE for individualized DM education.  - I have approached patient with the following individualized plan to manage diabetes and patient agrees.  - Based on her glycemic burden, She will  require insulin treatment for her diabetes.  -I have demonstrated insulin use in the exam room for her, with a sample. -I will proceed for her  to initiate basal insulin Toujeo 20 units daily at bedtime associated with monitoring of blood glucose before breakfast and at bedtime . - I will continue on metformin 1000 mg by mouth twice a day and lower  Invokana to 100mg  po qam.   - Patient will be considered for incretin therapy as appropriate next visit. - Patient specific target  for A1c; LDL, HDL, Triglycerides, and  Waist Circumference were discussed in detail.  2) BP/HTN: Controlled. Continue current medications including ACEI/ARB. 3) Lipids/HPL:  continue statins. 4)  Weight/Diet: CDE consult in progress, exercise, and carbohydrates information provided.  5) Hypothyroidism, unspecified hypothyroidism type   - Her thyroid function tests are consistent with appropriate replacement.  I advised her to continue levothyroxine 100 g by mouth every morning.  - We discussed about correct intake of levothyroxine, at fasting, with water, separated  by at least 30 minutes from breakfast, and separated by more than 4 hours from calcium, iron, multivitamins, acid reflux medications (PPIs). -Patient is made aware of the fact that thyroid hormone replacement is needed for life, dose to be adjusted by periodic monitoring of thyroid function tests.  6) Chronic Care/Health Maintenance:  -Patient is on ACEI/ARB and Statin medications and encouraged to continue to follow up with Ophthalmology, Podiatrist at least yearly or according to recommendations, and advised to  stay away from smoking. I have recommended yearly flu vaccine and pneumonia vaccination at least every 5 years; moderate intensity exercise for up to 150 minutes weekly; and  sleep for at least 7 hours a day.  I advised patient to maintain close follow up with their PCP for primary care needs.  Patient is asked to bring meter and  blood glucose logs during their next visit.   Follow up plan: Return in about 10 weeks (around 03/21/2016) for follow up with pre-visit labs, meter, and logs.  Glade Lloyd, MD Phone: [phone]  Fax: [phone]   01/11/2016, 2:53 PM

## 2016-01-11 NOTE — Patient Instructions (Signed)

## 2016-01-16 ENCOUNTER — Encounter: Payer: Self-pay | Admitting: "Endocrinology

## 2016-01-16 DIAGNOSIS — Z23 Encounter for immunization: Secondary | ICD-10-CM | POA: Diagnosis not present

## 2016-01-17 ENCOUNTER — Other Ambulatory Visit: Payer: Self-pay

## 2016-01-17 MED ORDER — INSULIN GLARGINE 300 UNIT/ML ~~LOC~~ SOPN: 20.0000 [IU] | PEN_INJECTOR | Freq: Every day | SUBCUTANEOUS | 0 refills | Status: DC

## 2016-01-18 ENCOUNTER — Other Ambulatory Visit (HOSPITAL_COMMUNITY): Payer: Self-pay | Admitting: Psychiatry

## 2016-01-23 ENCOUNTER — Telehealth (INDEPENDENT_AMBULATORY_CARE_PROVIDER_SITE_OTHER): Payer: Self-pay | Admitting: *Deleted

## 2016-01-23 NOTE — Telephone Encounter (Signed)
Patient called the office this morning leaving a PR report. She ask that Dr.Rehman be made aware that the new medication given to her is not helping the chest pain. The tums does help. This discomfort happens 3 times a day.  At the time of her OV on 12/12/2015 this is the Plan that [place] documented.  Patient advised to take omeprazole 30 minutes before evening meal. She can take Pepcid OTC at bedtime as needed for breakthrough heartburn. Patient will call with progress report in 2 weeks. Unless chest pain resolves she should follow-up with Dr. Luan Pulling. Office visit in one year.

## 2016-01-29 ENCOUNTER — Encounter: Payer: Self-pay | Admitting: Neurology

## 2016-01-29 ENCOUNTER — Ambulatory Visit (INDEPENDENT_AMBULATORY_CARE_PROVIDER_SITE_OTHER): Payer: PPO | Admitting: Neurology

## 2016-01-29 ENCOUNTER — Encounter (INDEPENDENT_AMBULATORY_CARE_PROVIDER_SITE_OTHER): Payer: Self-pay

## 2016-01-29 VITALS — BP 116/60 | HR 86 | Ht 64.0 in | Wt 251.5 lb

## 2016-01-29 DIAGNOSIS — G2581 Restless legs syndrome: Secondary | ICD-10-CM | POA: Diagnosis not present

## 2016-01-29 DIAGNOSIS — R413 Other amnesia: Secondary | ICD-10-CM

## 2016-01-29 DIAGNOSIS — R251 Tremor, unspecified: Secondary | ICD-10-CM | POA: Diagnosis not present

## 2016-01-29 MED ORDER — TOPIRAMATE 25 MG PO TABS
25.0000 mg | ORAL_TABLET | Freq: Two times a day (BID) | ORAL | 3 refills | Status: DC
Start: 2016-01-29 — End: 2016-06-03

## 2016-01-29 NOTE — Patient Instructions (Addendum)
   With the clonazepam 0.5 mg tablet, begin one at night for 3 weeks, then stop.  With the Topamax 25 mg take one at night for 2 weeks, then go to one twice a day.  Topamax (topiramate) is a seizure medication that has an FDA approval for seizures and for migraine headache. Potential side effects of this medication include weight loss, cognitive slowing, tingling in the fingers and toes, and carbonated drinks will taste bad. If any significant side effects are noted on this drug, please contact our office.

## 2016-01-29 NOTE — Progress Notes (Signed)
Reason for visit: Tremor  Norma [name] is an 65 y.o. female  History of present illness:  Norma [name] is a 65 year old right-handed white female with a history of diabetes, morbid obesity, and tremor. The patient is on CPAP for sleep apnea. The patient has had problems with increased fatigue and sleepiness. The patient indicates that recently she went to bed on Friday night, and essentially stayed in bed until Sunday morning. The patient will have good days and bad days with the tremor, occasionally she has difficulty performing handwriting, some days she does much better. The patient is on clonazepam taking 0.5 mg twice daily, she is also on gabapentin in part for the tremor and in part for restless leg syndrome. The patient is not bothered with restless leg syndrome much at this time. She indicates that her CPAP machine was interrogated recently, no adjustments were made to the settings. She comes back to this office for an evaluation. She is having increasing problems with memory, she is feeling somewhat spacey and with difficulty with concentration.  Past Medical History:  Diagnosis Date  . Anemia   . Bipolar disorder ([place])   . Bulging lumbar disc    L3-4  . Chronic daily headache   . Chronic low back pain 09/20/2014  . COPD (chronic obstructive pulmonary disease) ([place])   . Degenerative arthritis   . Depression   . Diabetes mellitus   . Diabetes mellitus, type II ([place])   . DM type 2 with diabetic peripheral neuropathy ([place]) 09/20/2014  . Dyslipidemia   . Gastroparesis   . GERD (gastroesophageal reflux disease)   . Hypertension   . Hypothyroidism   . IBS (irritable bowel syndrome)   . Memory difficulty 09/20/2014  . Morbid obesity ([place])   . Neuropathy ([place])   . Obstructive sleep apnea on CPAP   . Restless legs syndrome (RLS) 09/17/2012    Past Surgical History:  Procedure Laterality Date  . ABDOMINAL HYSTERECTOMY    . ARTHROSCOPY KNEE W/ DRILLING  06/2011   and Decemer of  2013.  [name] Carpel tunnel  1980's  . CHOLECYSTECTOMY    . lesions     Removed from tongue  . RECTAL SURGERY     fissure  . SHOULDER SURGERY     arthroscopy in March of this year    Family History  Problem Relation Age of Onset  . Hypertension Mother   . Lymphoma Mother   . Depression Mother   . Arthritis Father   . Alcohol abuse Father   . Hypertension Sister   . Cancer Brother     kidney and lung  . Alcohol abuse Brother   . Alcohol abuse Paternal Uncle   . Alcohol abuse Paternal Grandfather   . Alcohol abuse Paternal Grandmother     Social history:  reports that she quit smoking about 45 years ago. Her smoking use included Cigarettes. She has never used smokeless tobacco. She reports that she does not drink alcohol or use drugs.    Allergies  Allergen Reactions  . Amoxicillin Anaphylaxis  . Hydrocodone Anaphylaxis  . Primidone Anaphylaxis  . Aspirin   . Depacon [Valproic Acid]     Causes falls   . Lisinopril     Dr took off as precaution  . Peanuts [Peanut Oil]     All nuts  . Shellfish Allergy     Medications:  Prior to Admission medications   Medication Sig Start Date End Date Taking? Authorizing Provider  Aspirin-Acetaminophen-Caffeine (EXCEDRIN MIGRAINE PO) Take by mouth 2 (two) times daily. Patient takes this with her Tramadol.   Yes Historical Provider, MD  Biotin (HM BIOTIN) 57846 MCG TBDP Take 1 tablet by mouth daily.   Yes Historical Provider, MD  buPROPion (WELLBUTRIN XL) 300 MG 24 hr tablet TAKE ONE TABLET BY MOUTH EVERY MORNING. 10/18/15  Yes Cloria Spring, MD  canagliflozin [facility]) 100 MG TABS tablet Take 1 tablet (100 mg total) by mouth daily before breakfast. 01/11/16  Yes Cassandria Anger, MD  clonazePAM (KLONOPIN) 0.5 MG tablet TAKE ONE TABLET BY MOUTH TWICE DAILY. 01/04/16  Yes Kathrynn Ducking, MD  dexlansoprazole (DEXILANT) 60 MG capsule Take 1 capsule (60 mg total) by mouth daily. 12/29/15  Yes Rogene Houston, MD  diclofenac sodium  (VOLTAREN) 1 % GEL Apply topically as needed.   Yes Historical Provider, MD  EPINEPHrine (EPIPEN IJ) Inject as directed once. As needed   Yes Historical Provider, MD  fexofenadine (ALLEGRA) 180 MG tablet Take 180 mg by mouth daily as needed for allergies or rhinitis.   Yes Historical Provider, MD  gabapentin (NEURONTIN) 300 MG capsule One capsule twice a day and 2 capsules at night Patient taking differently: 1,200 mg 2 (two) times daily.  09/20/14  Yes Kathrynn Ducking, MD  Insulin Glargine [facility]) 100 UNIT/ML SOPN Inject 20 Units into the skin at bedtime.   Yes Historical Provider, MD  lamoTRIgine (LAMICTAL) 150 MG tablet TAKE ONE TABLET BY MOUTH AT BEDTIME. 10/18/15  Yes Cloria Spring, MD  levothyroxine (SYNTHROID, LEVOTHROID) 100 MCG tablet Take 100 mcg by mouth at bedtime.    Yes Historical Provider, MD  losartan (COZAAR) 50 MG tablet Take 50 mg by mouth at bedtime.    Yes Historical Provider, MD  meclizine (ANTIVERT) 25 MG tablet Take 1 tablet (25 mg total) by mouth 3 (three) times daily as needed for dizziness. 03/29/14  Yes Virgel Manifold, MD  metFORMIN (GLUCOPHAGE) 500 MG tablet Take 2 tablets (1,000 mg total) by mouth 2 (two) times daily with a meal. 02/15/15  Yes Cassandria Anger, MD  mupirocin ointment (BACTROBAN) 2 % APPLY AS DIRECTED TWICE DAILY 04/20/15  Yes Historical Provider, MD  nystatin-triamcinolone ointment (MYCOLOG) APPLY TWICE DAILY 12/12/14  Yes Rogene Houston, MD  pravastatin (PRAVACHOL) 20 MG tablet Take 20 mg by mouth daily. 03/08/14  Yes Historical Provider, MD  PROAIR HFA 108 (90 Base) MCG/ACT inhaler INHALE TWO PUFFS FOUR TIMES DAILY 09/04/15  Yes Historical Provider, MD  promethazine (PHENERGAN) 25 MG tablet Take 12.5-25 mg by mouth every 6 (six) hours as needed. 1/2 tablet twice daily as needed   Yes Historical Provider, MD  traMADol (ULTRAM) 50 MG tablet Take 2 tablets (100 mg total) by mouth every 8 (eight) hours as needed for pain. 12/25/12  Yes Charlett Blake, MD  venlafaxine XR (EFFEXOR-XR) 150 MG 24 hr capsule TAKE TWO (2) CAPSULES BY MOUTH AT BEDTIME. 10/18/15  Yes Cloria Spring, MD    ROS:  Out of a complete 14 system review of symptoms, the patient complains only of the following symptoms, and all other reviewed systems are negative.  Fatigue Hearing loss Eye pain, blurred vision Chest tightness Heart murmur Heat intolerance Sleep apnea, daytime sleepiness Environmental allergies, food allergies Joint pain, back pain, walking difficulty, neck pain Skin wounds Memory loss, headache, numbness Decreased concentration, depression  Blood pressure 116/60, pulse 86, height 5\' 4"  (1.626 m), weight 251 lb 8 oz (114.1 kg).  Physical Exam  General: The patient is alert and cooperative at the time of the examination. The patient is markedly obese.  Skin: No significant peripheral edema is noted.   Neurologic Exam  Mental status: The patient is alert and oriented x 3 at the time of the examination. The patient has apparent normal recent and remote memory, with an apparently normal attention span and concentration ability.   Cranial nerves: Facial symmetry is present. Speech is normal, no aphasia or dysarthria is noted. Extraocular movements are full. Visual fields are full. The patient has continual movements of the mouth and tongue.  Motor: The patient has good strength in all 4 extremities.  Sensory examination: Soft touch sensation is symmetric on the face, arms, and legs.  Coordination: The patient has good finger-nose-finger and heel-to-shin bilaterally. No significant tremor was seen with drawing a spiral.  Gait and station: The patient has a normal gait. Tandem gait is normal. Romberg is negative. No drift is seen.  Reflexes: Deep tendon reflexes are symmetric, but are depressed.   Assessment/Plan:  1. Essential tremor  2. Excessive daytime drowsiness  3. Reported memory disturbance  4. Sleep apnea on  CPAP  5. Restless leg syndrome  6. Tardive dyskinesia  The patient is reporting increased tremor, she will have good days and bad days with the tremor. She is having a general increase in fatigue and increased problems with mentation. The patient will be tapered off of her clonazepam going to 0.5 mg at night for 3 weeks and then stop the drug. Topamax will be added for the tremor working up to 25 mg twice daily. If the drowsiness has not improved coming off of the clonazepam, the patient may be a candidate for Provigil. She will follow-up in 4 months.  Jill Alexanders MD 01/29/2016 10:16 AM  [facility] [address] [place], [place] [address]  Phone [phone] Fax [phone]

## 2016-01-30 NOTE — Telephone Encounter (Signed)
Per Dr.Rehman the patient should follow up with Dr.Hawkins for Cardiac Workup. Patient was called and made aware.

## 2016-01-30 NOTE — Telephone Encounter (Signed)
Patient was advised. She states that she may not have given the medication enough time to work. She has only had 1 mild attack and that was Monday. She wants to give the medication a little more time,if it  happends again she will call Dr.Hawkins.

## 2016-02-02 ENCOUNTER — Other Ambulatory Visit: Payer: Self-pay | Admitting: "Endocrinology

## 2016-02-12 DIAGNOSIS — E1165 Type 2 diabetes mellitus with hyperglycemia: Secondary | ICD-10-CM | POA: Diagnosis not present

## 2016-02-12 DIAGNOSIS — K219 Gastro-esophageal reflux disease without esophagitis: Secondary | ICD-10-CM | POA: Diagnosis not present

## 2016-02-12 DIAGNOSIS — J449 Chronic obstructive pulmonary disease, unspecified: Secondary | ICD-10-CM | POA: Diagnosis not present

## 2016-02-12 DIAGNOSIS — R05 Cough: Secondary | ICD-10-CM | POA: Diagnosis not present

## 2016-02-12 DIAGNOSIS — Z23 Encounter for immunization: Secondary | ICD-10-CM | POA: Diagnosis not present

## 2016-02-13 ENCOUNTER — Ambulatory Visit (HOSPITAL_COMMUNITY)
Admission: RE | Admit: 2016-02-13 | Discharge: 2016-02-13 | Disposition: A | Discharge status: Home / Self Care | Payer: PPO | Source: Ambulatory Visit | Attending: Pulmonary Disease | Admitting: Pulmonary Disease

## 2016-02-13 ENCOUNTER — Other Ambulatory Visit (HOSPITAL_COMMUNITY): Payer: Self-pay | Admitting: Pulmonary Disease

## 2016-02-13 ENCOUNTER — Ambulatory Visit: Payer: Self-pay | Admitting: Nutrition

## 2016-02-13 DIAGNOSIS — R05 Cough: Secondary | ICD-10-CM

## 2016-02-13 DIAGNOSIS — R0602 Shortness of breath: Secondary | ICD-10-CM | POA: Diagnosis not present

## 2016-02-13 DIAGNOSIS — R059 Cough, unspecified: Secondary | ICD-10-CM

## 2016-02-13 DIAGNOSIS — R079 Chest pain, unspecified: Secondary | ICD-10-CM | POA: Diagnosis not present

## 2016-02-23 ENCOUNTER — Ambulatory Visit: Payer: Self-pay | Admitting: Internal Medicine

## 2016-03-21 ENCOUNTER — Telehealth: Payer: Self-pay | Admitting: "Endocrinology

## 2016-03-21 NOTE — Telephone Encounter (Signed)
Pt notified that we do have a sample that she can pick up but our supply is limited. Advised her to call back and let us know when she can get this and that we may be out if she waits.

## 2016-03-21 NOTE — Telephone Encounter (Signed)
Patient is requesting a sample of Toujeo. Please call.

## 2016-03-25 ENCOUNTER — Telehealth: Payer: Self-pay

## 2016-03-25 NOTE — Telephone Encounter (Signed)
Pt picked up toujeo sample. 1 pen

## 2016-03-26 ENCOUNTER — Ambulatory Visit: Payer: Self-pay | Admitting: Nutrition

## 2016-03-26 ENCOUNTER — Ambulatory Visit: Payer: PPO | Admitting: "Endocrinology

## 2016-03-27 ENCOUNTER — Other Ambulatory Visit: Payer: Self-pay | Admitting: "Endocrinology

## 2016-03-27 ENCOUNTER — Ambulatory Visit: Payer: PPO | Admitting: Nurse Practitioner

## 2016-03-27 ENCOUNTER — Other Ambulatory Visit (INDEPENDENT_AMBULATORY_CARE_PROVIDER_SITE_OTHER): Payer: Self-pay | Admitting: Internal Medicine

## 2016-03-27 DIAGNOSIS — E118 Type 2 diabetes mellitus with unspecified complications: Secondary | ICD-10-CM | POA: Diagnosis not present

## 2016-03-27 DIAGNOSIS — E1165 Type 2 diabetes mellitus with hyperglycemia: Secondary | ICD-10-CM | POA: Diagnosis not present

## 2016-03-27 DIAGNOSIS — Z794 Long term (current) use of insulin: Secondary | ICD-10-CM | POA: Diagnosis not present

## 2016-03-28 LAB — COMPREHENSIVE METABOLIC PANEL
ALBUMIN: 3.6 g/dL (ref 3.6–5.1)
ALK PHOS: 62 U/L (ref 33–130)
ALT: 28 U/L (ref 6–29)
AST: 21 U/L (ref 10–35)
BILIRUBIN TOTAL: 0.2 mg/dL (ref 0.2–1.2)
BUN: 12 mg/dL (ref 7–25)
CALCIUM: 9.1 mg/dL (ref 8.6–10.4)
CO2: 24 mmol/L (ref 20–31)
Chloride: 104 mmol/L (ref 98–110)
Creat: 0.96 mg/dL (ref 0.50–0.99)
Glucose, Bld: 188 mg/dL — ABNORMAL HIGH (ref 65–99)
Potassium: 4.7 mmol/L (ref 3.5–5.3)
Sodium: 139 mmol/L (ref 135–146)
TOTAL PROTEIN: 6.5 g/dL (ref 6.1–8.1)

## 2016-03-28 LAB — HEMOGLOBIN A1C
Hgb A1c MFr Bld: 6.6 % — ABNORMAL HIGH (ref <5.7)
MEAN PLASMA GLUCOSE: 143 mg/dL

## 2016-03-29 ENCOUNTER — Ambulatory Visit: Payer: PPO | Admitting: "Endocrinology

## 2016-04-02 ENCOUNTER — Ambulatory Visit (INDEPENDENT_AMBULATORY_CARE_PROVIDER_SITE_OTHER): Payer: PPO | Admitting: Cardiology

## 2016-04-02 ENCOUNTER — Encounter: Payer: Self-pay | Admitting: *Deleted

## 2016-04-02 ENCOUNTER — Encounter: Payer: PPO | Attending: "Endocrinology | Admitting: Nutrition

## 2016-04-02 ENCOUNTER — Encounter: Payer: Self-pay | Admitting: Cardiology

## 2016-04-02 VITALS — BP 114/72 | HR 79 | Ht 64.0 in | Wt 251.4 lb

## 2016-04-02 VITALS — Ht 64.0 in | Wt 251.4 lb

## 2016-04-02 DIAGNOSIS — R079 Chest pain, unspecified: Secondary | ICD-10-CM | POA: Diagnosis not present

## 2016-04-02 DIAGNOSIS — Z713 Dietary counseling and surveillance: Secondary | ICD-10-CM | POA: Diagnosis not present

## 2016-04-02 DIAGNOSIS — IMO0002 Reserved for concepts with insufficient information to code with codable children: Secondary | ICD-10-CM

## 2016-04-02 DIAGNOSIS — E119 Type 2 diabetes mellitus without complications: Secondary | ICD-10-CM | POA: Diagnosis not present

## 2016-04-02 DIAGNOSIS — I447 Left bundle-branch block, unspecified: Secondary | ICD-10-CM

## 2016-04-02 DIAGNOSIS — E1165 Type 2 diabetes mellitus with hyperglycemia: Secondary | ICD-10-CM

## 2016-04-02 DIAGNOSIS — E669 Obesity, unspecified: Secondary | ICD-10-CM

## 2016-04-02 DIAGNOSIS — Z794 Long term (current) use of insulin: Secondary | ICD-10-CM

## 2016-04-02 DIAGNOSIS — E118 Type 2 diabetes mellitus with unspecified complications: Secondary | ICD-10-CM

## 2016-04-02 NOTE — Patient Instructions (Addendum)
Goals . Try to eat three meals per day   Increase vegetables   Make a PB sandwich and take fruit or chex mix  to eat on way home from work and then eat dinner at [place] at 6/7. Prevent low blood sugar  Try Carnation Instant Breakfast or Glucerna for a meal replacement if desired. Increase physical actvity

## 2016-04-02 NOTE — Progress Notes (Signed)
  Medical Nutrition Therapy:  Appt start time: 1000 end time:  1030  Assessment:  Primary concerns today: Diabetes Type 2. A1C down from 8.3 to 6.6%. Saw Cardiologist today and they want to do a stress test on the 28th of Dec.  Has been having chest pain for last few months.  No weight change since last visit. Toujeo  20 units a day now. BS are coming down. On 100 mg of Invokana daily now.   Changes made since last visit-- she says none.  She is resistant to change eating habits. She continues to state she can't eat but 1 meals per day and small snacks throughtout the day because she isn't hungry. LIkes to snack on trail mix and Belvita cookies (30 gram carbs/4 g fiber) . Says her stomach bothers her and sometimes she can't eat much. Didn't bring meter. Says she can't follow diet as suggested. Denies low blood sugars. She notes she gets weak or shaky when blood sugars is like 113 mg/dl.     Not able to exercise due to physical debilitated state. Only works 3-4 hours a day. Likes to sleep in til 10-11 when she isn't working. Appears depressed. Has Bipolar.    Diet is inconsistent to meet her nutritional needs and maintain good blood sugar control. At risk for low blood sugars due to inconsistent meal pattern. Doesn' eat much fruit or low carb vegetables.   Lab Results  Component Value Date   HGBA1C 6.6 (H) 03/27/2016    Preferred Learning Style:    No preference indicated   Learning Readiness:   Ready  Change in progress   MEDICATIONS: See list.   DIETARY INTAKE:   24-hr recall:  B ( AM): Biscuit,  And egg.  Or Belivita  Snk ( AM):   L ( PM):  Eggs, bacon, toast and grits, , Unsweet tea Snk ( PM):  D ( PM):  2 hamburger patties, corn nugget s and okra, water Snk ( PM):  Chex mix Beverages: water or Tea large glass iin am: water   Usual physical activity: ADL  Estimated energy needs: 1200 calories 135 g carbohydrates 90 g protein 33 g fat  Progress Towards Goal(s):  In  progress.   Nutritional Diagnosis:  NB-1.1 Food and nutrition-related knowledge deficit As related to DM.  As evidenced by A1C 8.3%..    Intervention:  Nutrition and Diabetes education provided on My Plate, CHO counting, meal planning, portion sizes, timing of meals, avoiding snacks between meals unless having a low blood sugar, target ranges for A1C and blood sugars, signs/symptoms and treatment of hyper/hypoglycemia, monitoring blood sugars, taking medications as prescribed, benefits of exercising 30 minutes per day and prevention of complications of DM.  Goals . Try to eat three meals per day   Increase vegetables   Make a PB sandwich and take fruit or chex mix  to eat on way home from work and then eat dinner at [place] at 6/7. Prevent low blood sugar  Try Carnation Instant Breakfast or Glucerna for a meal replacement if desired. Increase physical actvity Teaching Method Utilized:  Visual Auditory Hands on  Handouts given during visit include:  The Plate Method  Meal Plan Card  Diabetes Instructions  Barriers to learning/adherence to lifestyle change: Back and leg pain  Demonstrated degree of understanding via:  Teach Back   Monitoring/Evaluation:  Dietary intake, exercise, meal planning, SBG, and body weight in 1 month(s).

## 2016-04-02 NOTE — Progress Notes (Signed)
Clinical Summary Norma [name] is a 65 y.o.female seen as a new consult, she is referred by Dr Luan Pulling.   1. Chest pain - nuclear stress 2007 without clear ischemia.  - symptoms started in early August. Cramping pain epigastric area that spreads throughout chest into both arms and left side of neck. 10/10 in severity. Occurs at rest or with exertion. Can have some SOB. Can be worst with laying down. No clear association with eating. - can have some DOE at times, though limited due to chronic back and leg pains. Fairly sedentary lifestyle. - No LE edema. - pain occurs daily, can be up to two times daily  CAD risk factors: DM2, HTN, hyperlipidemia, father with "weak heart" in late 13s   2. Chronic LBBB  Past Medical History:  Diagnosis Date  . Anemia   . Bipolar disorder ([place])   . Bulging lumbar disc    L3-4  . Chronic daily headache   . Chronic low back pain 09/20/2014  . COPD (chronic obstructive pulmonary disease) ([place])   . Degenerative arthritis   . Depression   . Diabetes mellitus   . Diabetes mellitus, type II ([place])   . DM type 2 with diabetic peripheral neuropathy ([place]) 09/20/2014  . Dyslipidemia   . Gastroparesis   . GERD (gastroesophageal reflux disease)   . Hypertension   . Hypothyroidism   . IBS (irritable bowel syndrome)   . Memory difficulty 09/20/2014  . Morbid obesity ([place])   . Neuropathy ([place])   . Obstructive sleep apnea on CPAP   . Restless legs syndrome (RLS) 09/17/2012     Allergies  Allergen Reactions  . Amoxicillin Anaphylaxis  . Hydrocodone Anaphylaxis  . Primidone Anaphylaxis  . Aspirin   . Depacon [Valproic Acid]     Causes falls   . Lisinopril     Dr took off as precaution  . Peanuts [Peanut Oil]     All nuts  . Shellfish Allergy      Current Outpatient Prescriptions  Medication Sig Dispense Refill  . Aspirin-Acetaminophen-Caffeine (EXCEDRIN MIGRAINE PO) Take by mouth 2 (two) times daily. Patient takes this with her Tramadol.    .  Biotin (HM BIOTIN) 91478 MCG TBDP Take 1 tablet by mouth daily.    [name] buPROPion (WELLBUTRIN XL) 300 MG 24 hr tablet TAKE ONE TABLET BY MOUTH EVERY MORNING. 30 tablet 2  . canagliflozin (INVOKANA) 100 MG TABS tablet Take 1 tablet (100 mg total) by mouth daily before breakfast. 30 tablet 3  . dexlansoprazole (DEXILANT) 60 MG capsule Take 1 capsule (60 mg total) by mouth daily. 90 capsule 3  . diclofenac sodium (VOLTAREN) 1 % GEL Apply topically as needed.    [name] EPINEPHrine (EPIPEN IJ) Inject as directed once. As needed    . fexofenadine (ALLEGRA) 180 MG tablet Take 180 mg by mouth daily as needed for allergies or rhinitis.    [name] gabapentin (NEURONTIN) 300 MG capsule One capsule twice a day and 2 capsules at night (Patient taking differently: 1,200 mg 2 (two) times daily. )    . lamoTRIgine (LAMICTAL) 150 MG tablet TAKE ONE TABLET BY MOUTH AT BEDTIME. 30 tablet 2  . levothyroxine (SYNTHROID, LEVOTHROID) 100 MCG tablet Take 100 mcg by mouth at bedtime.     [name] LITE TOUCH PEN NEEDLES 31G X 5 MM MISC USE ONE DAILY. 100 each 5  . losartan (COZAAR) 50 MG tablet Take 50 mg by mouth at bedtime.     . meclizine (  ANTIVERT) 25 MG tablet Take 1 tablet (25 mg total) by mouth 3 (three) times daily as needed for dizziness. 30 tablet 0  . metFORMIN (GLUCOPHAGE) 500 MG tablet Take 2 tablets (1,000 mg total) by mouth 2 (two) times daily with a meal. 60 tablet 3  . mupirocin ointment (BACTROBAN) 2 % APPLY AS DIRECTED TWICE DAILY  5  . nystatin-triamcinolone ointment (MYCOLOG) APPLY TWICE DAILY 60 g 0  . omeprazole (PRILOSEC) 40 MG capsule TAKE ONE CAPSULE BY MOUTH ONCE DAILY 90 capsule 3  . pravastatin (PRAVACHOL) 20 MG tablet Take 20 mg by mouth daily.  99  . PROAIR HFA 108 (90 Base) MCG/ACT inhaler INHALE TWO PUFFS FOUR TIMES DAILY  12  . promethazine (PHENERGAN) 25 MG tablet Take 12.5-25 mg by mouth every 6 (six) hours as needed. 1/2 tablet twice daily as needed    . topiramate (TOPAMAX) 25 MG tablet Take 1 tablet (25  mg total) by mouth 2 (two) times daily. 60 tablet 3  . traMADol (ULTRAM) 50 MG tablet Take 2 tablets (100 mg total) by mouth every 8 (eight) hours as needed for pain. 180 tablet 5  . venlafaxine XR (EFFEXOR-XR) 150 MG 24 hr capsule TAKE TWO (2) CAPSULES BY MOUTH AT BEDTIME. 60 capsule 2   No current facility-administered medications for this visit.      Past Surgical History:  Procedure Laterality Date  . ABDOMINAL HYSTERECTOMY    . ARTHROSCOPY KNEE W/ DRILLING  06/2011   and Decemer of 2013.  [name] Carpel tunnel  1980's  . CHOLECYSTECTOMY    . lesions     Removed from tongue  . RECTAL SURGERY     fissure  . SHOULDER SURGERY     arthroscopy in March of this year     Allergies  Allergen Reactions  . Amoxicillin Anaphylaxis  . Hydrocodone Anaphylaxis  . Primidone Anaphylaxis  . Aspirin   . Depacon [Valproic Acid]     Causes falls   . Lisinopril     Dr took off as precaution  . Peanuts [Peanut Oil]     All nuts  . Shellfish Allergy       Family History  Problem Relation Age of Onset  . Hypertension Mother   . Lymphoma Mother   . Depression Mother   . Arthritis Father   . Alcohol abuse Father   . Hypertension Sister   . Cancer Brother     kidney and lung  . Alcohol abuse Brother   . Alcohol abuse Paternal Uncle   . Alcohol abuse Paternal Grandfather   . Alcohol abuse Paternal Grandmother      Social History Norma [name] reports that she quit smoking about 45 years ago. Her smoking use included Cigarettes. She has never used smokeless tobacco. Norma [name] reports that she does not drink alcohol.   Review of Systems CONSTITUTIONAL: No weight loss, fever, chills, weakness or fatigue.  HEENT: Eyes: No visual loss, blurred vision, double vision or yellow sclerae.No hearing loss, sneezing, congestion, runny nose or sore throat.  SKIN: No rash or itching.  CARDIOVASCULAR: per hPI RESPIRATORY: No shortness of breath, cough or sputum.  GASTROINTESTINAL: No anorexia,  nausea, vomiting or diarrhea. No abdominal pain or blood.  GENITOURINARY: No burning on urination, no polyuria NEUROLOGICAL: No headache, dizziness, syncope, paralysis, ataxia, numbness or tingling in the extremities. No change in bowel or bladder control.  MUSCULOSKELETAL: No muscle, back pain, joint pain or stiffness.  LYMPHATICS: No enlarged nodes. No history of  splenectomy.  PSYCHIATRIC: No history of depression or anxiety.  ENDOCRINOLOGIC: No reports of sweating, cold or heat intolerance. No polyuria or polydipsia.  [name]   Physical Examination Vitals:   04/02/16 1334  BP: 114/72  Pulse: 79   Vitals:   04/02/16 1334  Weight: 251 lb 6.4 oz (114 kg)  Height: 5\' 4"  (1.626 m)    Gen: resting comfortably, no acute distress HEENT: no scleral icterus, pupils equal round and reactive, no palptable cervical adenopathy,  CV: RRR, no mr/g, no jvd Resp: Clear to auscultation bilaterally GI: abdomen is soft, non-tender, non-distended, normal bowel sounds, no hepatosplenomegaly MSK: extremities are warm, no edema.  Skin: warm, no rash Neuro:  no focal deficits Psych: appropriate affect     Assessment and Plan  1. Chest pain - unclear etiology. With CAD risk factors and LBBB we will plan for a lexiscan MPI   2. LBBB - EKG in clinic today shows LBBB - chronic, noted on prior EKGs - lexiscan MPI to evaluate for CAD      Arnoldo Lenis, M.D.,

## 2016-04-02 NOTE — Patient Instructions (Signed)
Your physician recommends that you schedule a follow-up appointment TO BE DETERMINED  Your physician recommends that you continue on your current medications as directed. Please refer to the Current Medication list given to you today.  Your physician has requested that you have a lexiscan myoview. For further information please visit [email or web address]. Please follow instruction sheet, as given. 2 DAY   Thank you for choosing [facility]!!

## 2016-04-11 ENCOUNTER — Encounter (HOSPITAL_COMMUNITY)
Admission: RE | Admit: 2016-04-11 | Discharge: 2016-04-11 | Disposition: A | Discharge status: Home / Self Care | Payer: PPO | Source: Ambulatory Visit | Attending: Cardiology | Admitting: Cardiology

## 2016-04-11 ENCOUNTER — Inpatient Hospital Stay (HOSPITAL_COMMUNITY): Admission: RE | Admit: 2016-04-11 | Payer: Self-pay | Source: Ambulatory Visit

## 2016-04-11 ENCOUNTER — Encounter (HOSPITAL_COMMUNITY): Payer: Self-pay

## 2016-04-11 DIAGNOSIS — R079 Chest pain, unspecified: Secondary | ICD-10-CM | POA: Diagnosis not present

## 2016-04-11 MED ORDER — REGADENOSON 0.4 MG/5ML IV SOLN
INTRAVENOUS | Status: AC
  Administered 2016-04-11: 0.4 mg via INTRAVENOUS
  Filled 2016-04-11: qty 5

## 2016-04-11 MED ORDER — SODIUM CHLORIDE 0.9% FLUSH
INTRAVENOUS | Status: AC
  Administered 2016-04-11: 10 mL via INTRAVENOUS
  Filled 2016-04-11: qty 10

## 2016-04-11 MED ORDER — TECHNETIUM TC 99M TETROFOSMIN IV KIT
30.0000 | PACK | Freq: Once | INTRAVENOUS | Status: AC | PRN
  Administered 2016-04-11: 30 via INTRAVENOUS

## 2016-04-12 ENCOUNTER — Encounter (HOSPITAL_COMMUNITY): Payer: Self-pay

## 2016-04-12 ENCOUNTER — Encounter (HOSPITAL_COMMUNITY)
Admission: RE | Admit: 2016-04-12 | Discharge: 2016-04-12 | Disposition: A | Discharge status: Home / Self Care | Payer: PPO | Source: Ambulatory Visit | Attending: Cardiology | Admitting: Cardiology

## 2016-04-12 DIAGNOSIS — R079 Chest pain, unspecified: Secondary | ICD-10-CM | POA: Diagnosis not present

## 2016-04-12 LAB — NM MYOCAR MULTI W/SPECT W/WALL MOTION / EF
CHL CUP RESTING HR STRESS: 74 {beats}/min
LVDIAVOL: 85 mL (ref 46–106)
LVSYSVOL: 37 mL
Peak HR: 95 {beats}/min
RATE: 0.27
SDS: 5
SRS: 1
SSS: 6
TID: 0.87

## 2016-04-12 MED ORDER — TECHNETIUM TC 99M TETROFOSMIN IV KIT
25.0000 | PACK | Freq: Once | INTRAVENOUS | Status: AC | PRN
  Administered 2016-04-12: 24.3 via INTRAVENOUS

## 2016-04-16 ENCOUNTER — Ambulatory Visit (INDEPENDENT_AMBULATORY_CARE_PROVIDER_SITE_OTHER): Payer: PPO | Admitting: "Endocrinology

## 2016-04-16 ENCOUNTER — Ambulatory Visit: Payer: Self-pay | Admitting: Nutrition

## 2016-04-16 ENCOUNTER — Ambulatory Visit: Payer: PPO | Admitting: "Endocrinology

## 2016-04-16 ENCOUNTER — Encounter: Payer: Self-pay | Admitting: "Endocrinology

## 2016-04-16 VITALS — BP 126/78 | HR 101 | Ht 64.0 in | Wt 252.0 lb

## 2016-04-16 DIAGNOSIS — E1165 Type 2 diabetes mellitus with hyperglycemia: Secondary | ICD-10-CM | POA: Diagnosis not present

## 2016-04-16 DIAGNOSIS — Z794 Long term (current) use of insulin: Secondary | ICD-10-CM

## 2016-04-16 DIAGNOSIS — I10 Essential (primary) hypertension: Secondary | ICD-10-CM

## 2016-04-16 DIAGNOSIS — IMO0002 Reserved for concepts with insufficient information to code with codable children: Secondary | ICD-10-CM

## 2016-04-16 DIAGNOSIS — E782 Mixed hyperlipidemia: Secondary | ICD-10-CM | POA: Diagnosis not present

## 2016-04-16 DIAGNOSIS — E038 Other specified hypothyroidism: Secondary | ICD-10-CM | POA: Diagnosis not present

## 2016-04-16 DIAGNOSIS — E118 Type 2 diabetes mellitus with unspecified complications: Secondary | ICD-10-CM | POA: Diagnosis not present

## 2016-04-16 NOTE — Progress Notes (Signed)
Subjective:    Patient ID: Norma [name], female    DOB: 05/25/1950,    Past Medical History:  Diagnosis Date  . Anemia   . Bipolar disorder ([place])   . Bulging lumbar disc    L3-4  . Chronic daily headache   . Chronic low back pain 09/20/2014  . COPD (chronic obstructive pulmonary disease) ([place])   . Degenerative arthritis   . Depression   . Diabetes mellitus   . Diabetes mellitus, type II ([place])   . DM type 2 with diabetic peripheral neuropathy ([place]) 09/20/2014  . Dyslipidemia   . Gastroparesis   . GERD (gastroesophageal reflux disease)   . Hypertension   . Hypothyroidism   . IBS (irritable bowel syndrome)   . Memory difficulty 09/20/2014  . Morbid obesity ([place])   . Neuropathy ([place])   . Obstructive sleep apnea on CPAP   . Restless legs syndrome (RLS) 09/17/2012   Past Surgical History:  Procedure Laterality Date  . ABDOMINAL HYSTERECTOMY    . ARTHROSCOPY KNEE W/ DRILLING  06/2011   and Decemer of 2013.  [name] Carpel tunnel  1980's  . CHOLECYSTECTOMY    . lesions     Removed from tongue  . RECTAL SURGERY     fissure  . SHOULDER SURGERY     arthroscopy in March of this year   Social History   Social History  . Marital status: Single    Spouse name: N/A  . Number of children: 0  . Years of education: 44   Occupational History  .  [employer]   Social History Main Topics  . Smoking status: Former Smoker    Types: Cigarettes    Quit date: 02/04/1971  . Smokeless tobacco: Never Used     Comment: smoked 2 cigarettes a day  . Alcohol use No  . Drug use: No  . Sexual activity: No   Other Topics Concern  . None   Social History Narrative   Patient lives at home alone.    Patient has no children.    Patient has her masters in nursing.    Patient is single.    Patient drinks about 2 glasses of tea daily.   Patient is right handed.   Outpatient Encounter Prescriptions as of 04/16/2016  Medication Sig  . Aspirin-Acetaminophen-Caffeine (EXCEDRIN  MIGRAINE PO) Take by mouth 2 (two) times daily. Patient takes this with her Tramadol.  [name] buPROPion (WELLBUTRIN XL) 300 MG 24 hr tablet TAKE ONE TABLET BY MOUTH EVERY MORNING.  . butalbital-acetaminophen-caffeine (FIORICET WITH CODEINE) 50-325-40-30 MG capsule Take 1 capsule by mouth every 4 (four) hours as needed for headache.  . canagliflozin (INVOKANA) 100 MG TABS tablet Take 1 tablet (100 mg total) by mouth daily before breakfast.  . diclofenac sodium (VOLTAREN) 1 % GEL Apply topically as needed.  [name] EPINEPHrine (EPIPEN IJ) Inject as directed once. As needed  . gabapentin (NEURONTIN) 300 MG capsule Take 1,200 mg by mouth 2 (two) times daily.  . Insulin Glargine (TOUJEO SOLOSTAR) 300 UNIT/ML SOPN Inject 20 Units into the skin at bedtime.  . lamoTRIgine (LAMICTAL) 150 MG tablet TAKE ONE TABLET BY MOUTH AT BEDTIME.  [name] levothyroxine (SYNTHROID, LEVOTHROID) 100 MCG tablet Take 100 mcg by mouth at bedtime.   [name] LITE TOUCH PEN NEEDLES 31G X 5 MM MISC USE ONE DAILY.  [name] losartan (COZAAR) 50 MG tablet Take 50 mg by mouth at bedtime.   . meclizine (ANTIVERT) 25 MG tablet Take  1 tablet (25 mg total) by mouth 3 (three) times daily as needed for dizziness.  . metFORMIN (GLUCOPHAGE) 500 MG tablet Take 2 tablets (1,000 mg total) by mouth 2 (two) times daily with a meal.  . mupirocin ointment (BACTROBAN) 2 % APPLY AS DIRECTED TWICE DAILY  . nystatin-triamcinolone ointment (MYCOLOG) APPLY TWICE DAILY  . omeprazole (PRILOSEC) 40 MG capsule TAKE ONE CAPSULE BY MOUTH ONCE DAILY  . pravastatin (PRAVACHOL) 20 MG tablet Take 20 mg by mouth daily.  [name] PROAIR HFA 108 (90 Base) MCG/ACT inhaler INHALE TWO PUFFS FOUR TIMES DAILY  . promethazine (PHENERGAN) 25 MG tablet Take 12.5-25 mg by mouth every 6 (six) hours as needed. 1/2 tablet twice daily as needed  . topiramate (TOPAMAX) 25 MG tablet Take 1 tablet (25 mg total) by mouth 2 (two) times daily.  . traMADol (ULTRAM) 50 MG tablet Take 2 tablets (100 mg total) by mouth  every 8 (eight) hours as needed for pain.  [name] venlafaxine XR (EFFEXOR-XR) 150 MG 24 hr capsule TAKE TWO (2) CAPSULES BY MOUTH AT BEDTIME.   No facility-administered encounter medications on file as of 04/16/2016.    ALLERGIES: Allergies  Allergen Reactions  . Amoxicillin Anaphylaxis  . Hydrocodone Anaphylaxis  . Primidone Anaphylaxis  . Aspirin   . Depacon [Valproic Acid]     Causes falls   . Lisinopril     Dr took off as precaution  . Peanuts [Peanut Oil]     All nuts  . Shellfish Allergy    VACCINATION STATUS:  There is no immunization history on file for this patient.  Diabetes  She presents for her follow-up diabetic visit. She has type 2 diabetes mellitus. Onset time: She was diagnosed at approximate age of 70 years. Her disease course has been worsening. There are no hypoglycemic associated symptoms. Pertinent negatives for hypoglycemia include no confusion, headaches, pallor or seizures. Associated symptoms include fatigue, polydipsia and polyuria. Pertinent negatives for diabetes include no chest pain and no polyphagia. There are no hypoglycemic complications. Symptoms are worsening. There are no diabetic complications. Risk factors for coronary artery disease include diabetes mellitus, dyslipidemia, hypertension, obesity and sedentary lifestyle. Current diabetic treatment includes oral agent (dual therapy). She is compliant with treatment most of the time. Her weight is stable. She is following a generally unhealthy diet. She has not had a previous visit with a dietitian. She never participates in exercise. Her breakfast blood glucose range is generally 130-140 mg/dl. Her dinner blood glucose range is generally 140-180 mg/dl. Her overall blood glucose range is 140-180 mg/dl. An ACE inhibitor/angiotensin II receptor blocker is being taken. Eye exam is current.  Hypertension  This is a chronic problem. The current episode started more than 1 year ago. Pertinent negatives include no  chest pain, headaches, palpitations or shortness of breath. Past treatments include ACE inhibitors. Compliance problems include diet, exercise, medication cost, medication side effects and psychosocial issues.  Hypertensive end-organ damage includes a thyroid problem.  Hyperlipidemia  This is a chronic problem. The current episode started more than 1 year ago. Pertinent negatives include no chest pain, myalgias or shortness of breath. Current antihyperlipidemic treatment includes statins. Risk factors for coronary artery disease include a sedentary lifestyle, obesity, hypertension, diabetes mellitus and dyslipidemia.  Thyroid Problem  Presents for follow-up visit. Symptoms include fatigue. Patient reports no cold intolerance, diarrhea, heat intolerance or palpitations. The symptoms have been stable. Past treatments include levothyroxine. Her past medical history is significant for hyperlipidemia.     Review  of Systems  Constitutional: Positive for fatigue. Negative for unexpected weight change.  HENT: Negative for trouble swallowing and voice change.   Eyes: Negative for visual disturbance.  Respiratory: Negative for cough, shortness of breath and wheezing.   Cardiovascular: Negative for chest pain, palpitations and leg swelling.  Gastrointestinal: Negative for diarrhea, nausea and vomiting.  Endocrine: Positive for polydipsia and polyuria. Negative for cold intolerance, heat intolerance and polyphagia.  Musculoskeletal: Negative for arthralgias and myalgias.  Skin: Negative for color change, pallor, rash and wound.  Neurological: Negative for seizures and headaches.  Psychiatric/Behavioral: Negative for confusion and suicidal ideas.    Objective:    BP 126/78   Pulse (!) 101   Ht 5\' 4"  (1.626 m)   Wt 252 lb (114.3 kg)   BMI 43.26 kg/m   Wt Readings from Last 3 Encounters:  04/16/16 252 lb (114.3 kg)  04/02/16 251 lb 6.4 oz (114 kg)  04/02/16 251 lb 6.4 oz (114 kg)    Physical Exam   Constitutional: She is oriented to person, place, and time. She appears well-developed.  HENT:  Head: Normocephalic and atraumatic.  Eyes: EOM are normal.  Neck: Normal range of motion. Neck supple. No tracheal deviation present. No thyromegaly present.  Cardiovascular: Normal rate and regular rhythm.   Pulmonary/Chest: Effort normal and breath sounds normal.  Abdominal: Soft. Bowel sounds are normal. There is no tenderness. There is no guarding.  Musculoskeletal: Normal range of motion. She exhibits no edema.  Neurological: She is alert and oriented to person, place, and time. She has normal reflexes. No cranial nerve deficit. Coordination normal.  Skin: Skin is warm and dry. No rash noted. No erythema. No pallor.  Psychiatric: She has a normal mood and affect. Judgment normal.    Results for orders placed or performed during the hospital encounter of 04/11/16  NM Myocar Multi W/Spect W/Wall Motion / EF  Result Value Ref Range   Rest HR 74 bpm   Rest BP 94/46 mmHg   Peak HR 95 bpm   Peak BP 125/62 mmHg   SSS 6    SRS 1    SDS 5    LHR 0.27    TID 0.87    LV sys vol 37 mL   LV dias vol 85 46 - 106 mL   Complete Blood Count (Most recent): No results found for: WBC, HGB, HCT, MCV, PLT Chemistry (most recent): Lab Results  Component Value Date   NA 139 03/27/2016   K 4.7 03/27/2016   CL 104 03/27/2016   CO2 24 03/27/2016   BUN 12 03/27/2016   CREATININE 0.96 03/27/2016   Diabetic Labs (most recent): Lab Results  Component Value Date   HGBA1C 6.6 (H) 03/27/2016   HGBA1C 9 12/25/2015   HGBA1C 7.8 09/07/2015    Assessment & Plan:   1. Uncontrolled type 2 diabetes mellitus with complication, without long-term current use of insulin (HCC)  Her diabetes is  complicated by neuropathy and patient remains at a high risk for more acute and chronic complications of diabetes which include CAD, CVA, CKD, retinopathy, and neuropathy. These are all discussed in detail with the  patient.  Patient came with better control of her diabetes with A1c improving to 6.6% from to  9% .  - I have re-counseled the patient on diet management and weight loss  by adopting a carbohydrate restricted / protein rich  Diet.  - Suggestion is made for patient to avoid simple carbohydrates   from  their diet including Cakes , Desserts, Ice Cream,  Soda (  diet and regular) , Sweet Tea , Candies,  Chips, Cookies, Artificial Sweeteners,   and "Sugar-free" Products .  This will help patient to have stable blood glucose profile and potentially avoid unintended  Weight gain.  - Patient is advised to stick to a routine mealtimes to eat 3 meals  a day and avoid unnecessary snacks ( to snack only to correct hypoglycemia).  - The patient  will be  scheduled with Jearld Fenton, RDN, CDE for individualized DM education.  - I have approached patient with the following individualized plan to manage diabetes and patient agrees.  - Based on her  Recent glycemic burden, She will continue to  require basal insulin treatment for her diabetes.   -I will proceed  with basal insulin Toujeo 20 units daily at bedtime associated with monitoring of blood glucose before breakfast and at bedtime . - I will continue on metformin 1000 mg by mouth twice a day and  Invokana  100mg  po qam.   - Patient will be considered for incretin therapy as appropriate next visit. - Patient specific target  for A1c; LDL, HDL, Triglycerides, and  Waist Circumference were discussed in detail.  2) BP/HTN: Controlled. Continue current medications including ACEI/ARB. 3) Lipids/HPL:  continue statins. 4)  Weight/Diet: CDE consult in progress, exercise, and carbohydrates information provided.  5) Hypothyroidism  - Her thyroid function tests are consistent with appropriate replacement.  I advised her to continue levothyroxine 100 g by mouth every morning.  - We discussed about correct intake of levothyroxine, at fasting, with water,  separated by at least 30 minutes from breakfast, and separated by more than 4 hours from calcium, iron, multivitamins, acid reflux medications (PPIs). -Patient is made aware of the fact that thyroid hormone replacement is needed for life, dose to be adjusted by periodic monitoring of thyroid function tests.  6) Chronic Care/Health Maintenance:  -Patient is on ACEI/ARB and Statin medications and encouraged to continue to follow up with Ophthalmology, Podiatrist at least yearly or according to recommendations, and advised to  stay away from smoking. I have recommended yearly flu vaccine and pneumonia vaccination at least every 5 years; moderate intensity exercise for up to 150 minutes weekly; and  sleep for at least 7 hours a day.  I advised patient to maintain close follow up with their PCP for primary care needs.  Patient is asked to bring meter and  blood glucose logs during their next visit.   Follow up plan: Return in about 3 months (around 07/15/2016) for follow up with pre-visit labs, meter, and logs.  Glade Lloyd, MD Phone: [phone]  Fax: [phone]   04/16/2016, 1:46 PM

## 2016-04-16 NOTE — Patient Instructions (Signed)

## 2016-04-18 ENCOUNTER — Telehealth: Payer: Self-pay | Admitting: *Deleted

## 2016-04-18 NOTE — Telephone Encounter (Signed)
Pt aware - scheduled 1 month f/u - routed to pcp

## 2016-04-18 NOTE — Telephone Encounter (Signed)
-----   Message from Arnoldo Lenis, MD sent at 04/16/2016 12:12 PM EST ----- Stress test overall looks good, no evidence of blockages. She should f/u in 1 month  Zandra Abts MD

## 2016-04-19 ENCOUNTER — Telehealth (INDEPENDENT_AMBULATORY_CARE_PROVIDER_SITE_OTHER): Payer: Self-pay | Admitting: *Deleted

## 2016-04-19 NOTE — Telephone Encounter (Signed)
Norma [name] called the office this morning. She ask that you review her Stress Test, that it was normal. States that you had wanted her to have th at done prior to doing something else. Sates that her symptoms have got worse. [phone].

## 2016-04-23 ENCOUNTER — Other Ambulatory Visit (INDEPENDENT_AMBULATORY_CARE_PROVIDER_SITE_OTHER): Payer: Self-pay | Admitting: *Deleted

## 2016-04-23 DIAGNOSIS — R079 Chest pain, unspecified: Secondary | ICD-10-CM | POA: Insufficient documentation

## 2016-04-23 DIAGNOSIS — R0789 Other chest pain: Secondary | ICD-10-CM

## 2016-04-23 NOTE — Telephone Encounter (Signed)
EGD sch'd 04/25/16 at 730 (630), patient aware, verbal instructions given to patient

## 2016-04-23 NOTE — Telephone Encounter (Signed)
Talked with patient. She remains with chest pain which is worse at night. Cardiac evaluation is negative. Will proceed with EGD.

## 2016-04-25 ENCOUNTER — Ambulatory Visit (HOSPITAL_COMMUNITY)
Admission: RE | Admit: 2016-04-25 | Discharge: 2016-04-25 | Disposition: A | Discharge status: Home / Self Care | Payer: PPO | Source: Ambulatory Visit | Attending: Internal Medicine | Admitting: Internal Medicine

## 2016-04-25 ENCOUNTER — Encounter (HOSPITAL_COMMUNITY): Payer: Self-pay | Admitting: *Deleted

## 2016-04-25 ENCOUNTER — Encounter (HOSPITAL_COMMUNITY)
Admission: RE | Disposition: A | Discharge status: Home / Self Care | Payer: Self-pay | Source: Ambulatory Visit | Attending: Internal Medicine

## 2016-04-25 DIAGNOSIS — R51 Headache: Secondary | ICD-10-CM | POA: Insufficient documentation

## 2016-04-25 DIAGNOSIS — K449 Diaphragmatic hernia without obstruction or gangrene: Secondary | ICD-10-CM | POA: Insufficient documentation

## 2016-04-25 DIAGNOSIS — K2950 Unspecified chronic gastritis without bleeding: Secondary | ICD-10-CM | POA: Insufficient documentation

## 2016-04-25 DIAGNOSIS — E1143 Type 2 diabetes mellitus with diabetic autonomic (poly)neuropathy: Secondary | ICD-10-CM | POA: Insufficient documentation

## 2016-04-25 DIAGNOSIS — R0789 Other chest pain: Secondary | ICD-10-CM | POA: Insufficient documentation

## 2016-04-25 DIAGNOSIS — K3189 Other diseases of stomach and duodenum: Secondary | ICD-10-CM | POA: Diagnosis not present

## 2016-04-25 DIAGNOSIS — E669 Obesity, unspecified: Secondary | ICD-10-CM | POA: Diagnosis not present

## 2016-04-25 DIAGNOSIS — G2581 Restless legs syndrome: Secondary | ICD-10-CM | POA: Insufficient documentation

## 2016-04-25 DIAGNOSIS — J449 Chronic obstructive pulmonary disease, unspecified: Secondary | ICD-10-CM | POA: Insufficient documentation

## 2016-04-25 DIAGNOSIS — Z794 Long term (current) use of insulin: Secondary | ICD-10-CM | POA: Insufficient documentation

## 2016-04-25 DIAGNOSIS — R079 Chest pain, unspecified: Secondary | ICD-10-CM | POA: Insufficient documentation

## 2016-04-25 DIAGNOSIS — Z6841 Body Mass Index (BMI) 40.0 and over, adult: Secondary | ICD-10-CM | POA: Insufficient documentation

## 2016-04-25 DIAGNOSIS — F319 Bipolar disorder, unspecified: Secondary | ICD-10-CM | POA: Diagnosis not present

## 2016-04-25 DIAGNOSIS — Z79899 Other long term (current) drug therapy: Secondary | ICD-10-CM | POA: Diagnosis not present

## 2016-04-25 DIAGNOSIS — I10 Essential (primary) hypertension: Secondary | ICD-10-CM | POA: Insufficient documentation

## 2016-04-25 DIAGNOSIS — G4733 Obstructive sleep apnea (adult) (pediatric): Secondary | ICD-10-CM | POA: Insufficient documentation

## 2016-04-25 DIAGNOSIS — K3184 Gastroparesis: Secondary | ICD-10-CM | POA: Insufficient documentation

## 2016-04-25 DIAGNOSIS — K228 Other specified diseases of esophagus: Secondary | ICD-10-CM | POA: Insufficient documentation

## 2016-04-25 DIAGNOSIS — Z87891 Personal history of nicotine dependence: Secondary | ICD-10-CM | POA: Insufficient documentation

## 2016-04-25 DIAGNOSIS — K219 Gastro-esophageal reflux disease without esophagitis: Secondary | ICD-10-CM | POA: Diagnosis not present

## 2016-04-25 DIAGNOSIS — Z7982 Long term (current) use of aspirin: Secondary | ICD-10-CM | POA: Diagnosis not present

## 2016-04-25 DIAGNOSIS — E039 Hypothyroidism, unspecified: Secondary | ICD-10-CM | POA: Insufficient documentation

## 2016-04-25 DIAGNOSIS — K766 Portal hypertension: Secondary | ICD-10-CM | POA: Insufficient documentation

## 2016-04-25 HISTORY — PX: ESOPHAGOGASTRODUODENOSCOPY: SHX5428

## 2016-04-25 LAB — GLUCOSE, CAPILLARY: Glucose-Capillary: 173 mg/dL — ABNORMAL HIGH (ref 65–99)

## 2016-04-25 SURGERY — EGD (ESOPHAGOGASTRODUODENOSCOPY)
Anesthesia: Moderate Sedation

## 2016-04-25 MED ORDER — MEPERIDINE HCL 50 MG/ML IJ SOLN
INTRAMUSCULAR | Status: DC | PRN
  Administered 2016-04-25 (×2): 25 mg via INTRAVENOUS

## 2016-04-25 MED ORDER — SODIUM CHLORIDE 0.9 % IV SOLN
INTRAVENOUS | Status: DC
Start: 2016-04-25 — End: 2016-04-25
  Administered 2016-04-25: 1000 mL via INTRAVENOUS

## 2016-04-25 MED ORDER — MEPERIDINE HCL 50 MG/ML IJ SOLN
INTRAMUSCULAR | Status: AC
  Filled 2016-04-25: qty 1

## 2016-04-25 MED ORDER — MIDAZOLAM HCL 5 MG/5ML IJ SOLN
INTRAMUSCULAR | Status: DC | PRN
  Administered 2016-04-25 (×2): 2 mg via INTRAVENOUS
  Administered 2016-04-25: 3 mg via INTRAVENOUS

## 2016-04-25 MED ORDER — MIDAZOLAM HCL 5 MG/5ML IJ SOLN
INTRAMUSCULAR | Status: AC
  Filled 2016-04-25: qty 10

## 2016-04-25 MED ORDER — STERILE WATER FOR IRRIGATION IR SOLN
Status: DC | PRN
  Administered 2016-04-25: 08:00:00

## 2016-04-25 NOTE — H&P (Signed)
Norma [name] is an 66 y.o. female.   Chief Complaint: Patient is here for EGD. HPI: Patient is 66 year old Caucasian female with multiple medical problems who presents with retrosternal chest pain. She says lately pains been occurring every night. She has nausea but no vomiting. She states she sleeps and 5 pillows. She is using Tums which helps sometimes. He never radiates into her left shoulder neck and jaw. She denies dysphagia abdominal pain melena or rectal bleeding. She denies shortness of breath. She was seen by Dr. Carlyle Dolly noninvasive cardiac evaluation negative. She is taking Excedrin Migraine once or twice daily.  Past Medical History:  Diagnosis Date      . Bipolar disorder ([place])   . Bulging lumbar disc    L3-4  . Chronic daily headache   . Chronic low back pain 09/20/2014  . COPD (chronic obstructive pulmonary disease) ([place])   . Degenerative arthritis   . Depression       . Diabetes mellitus, type II ([place])   . DM type 2 with diabetic peripheral neuropathy ([place]) 09/20/2014  . Dyslipidemia   . Gastroparesis   . GERD (gastroesophageal reflux disease)   . Hypertension   . Hypothyroidism   . IBS (irritable bowel syndrome)   . Memory difficulty 09/20/2014   Obesity   . Neuropathy ([place])   . Obstructive sleep apnea on CPAP   . Restless legs syndrome (RLS) 09/17/2012    Past Surgical History:  Procedure Laterality Date  . ABDOMINAL HYSTERECTOMY    . ARTHROSCOPY KNEE W/ DRILLING  06/2011   and Decemer of 2013.  [name] Carpel tunnel  1980's  . CHOLECYSTECTOMY    . lesions     Removed from tongue  . RECTAL SURGERY     fissure  . SHOULDER SURGERY     arthroscopy in March of this year    Family History  Problem Relation Age of Onset  . Hypertension Mother   . Lymphoma Mother   . Depression Mother   . Arthritis Father   . Alcohol abuse Father   . Hypertension Sister   . Cancer Brother     kidney and lung  . Alcohol abuse Brother   . Alcohol abuse Paternal Uncle    . Alcohol abuse Paternal Grandfather   . Alcohol abuse Paternal Grandmother    Social History:  reports that she quit smoking about 45 years ago. Her smoking use included Cigarettes. She has never used smokeless tobacco. She reports that she does not drink alcohol or use drugs.  Allergies:  Allergies  Allergen Reactions  . Amoxicillin Anaphylaxis  . Hydrocodone Anaphylaxis  . Primidone Anaphylaxis  . Depacon [Valproic Acid]     Causes falls   . Lisinopril     Dr took off as precaution    Medications Prior to Admission  Medication Sig Dispense Refill  . albuterol (PROVENTIL HFA;VENTOLIN HFA) 108 (90 Base) MCG/ACT inhaler Inhale 2 puffs into the lungs every 6 (six) hours as needed for wheezing or shortness of breath.    . Aspirin-Acetaminophen-Caffeine (EXCEDRIN MIGRAINE PO) Take 2 tablets by mouth 3 (three) times daily as needed (pain). Take with tramadol    . buPROPion (WELLBUTRIN XL) 300 MG 24 hr tablet TAKE ONE TABLET BY MOUTH EVERY MORNING. 30 tablet 2  . butalbital-acetaminophen-caffeine (FIORICET WITH CODEINE) 50-325-40-30 MG capsule Take 1 capsule by mouth every 4 (four) hours as needed for headache.    . canagliflozin (INVOKANA) 100 MG TABS tablet Take 1 tablet (100  mg total) by mouth daily before breakfast. 30 tablet 3  . diclofenac sodium (VOLTAREN) 1 % GEL Apply 1 application topically as needed (pain).     [name] gabapentin (NEURONTIN) 300 MG capsule Take 1,200 mg by mouth 2 (two) times daily.    . Insulin Glargine (TOUJEO SOLOSTAR) 300 UNIT/ML SOPN Inject 20 Units into the skin at bedtime.    . lamoTRIgine (LAMICTAL) 150 MG tablet TAKE ONE TABLET BY MOUTH AT BEDTIME. 30 tablet 2  . levothyroxine (SYNTHROID, LEVOTHROID) 100 MCG tablet Take 100 mcg by mouth at bedtime.     [name] losartan (COZAAR) 50 MG tablet Take 50 mg by mouth at bedtime.     . meclizine (ANTIVERT) 25 MG tablet Take 1 tablet (25 mg total) by mouth 3 (three) times daily as needed for dizziness. (Patient taking  differently: Take 25 mg by mouth daily as needed for dizziness. ) 30 tablet 0  . metFORMIN (GLUCOPHAGE) 500 MG tablet Take 2 tablets (1,000 mg total) by mouth 2 (two) times daily with a meal. 60 tablet 3  . nystatin-triamcinolone ointment (MYCOLOG) APPLY TWICE DAILY (Patient taking differently: Apply as needed for rash) 60 g 0  . omeprazole (PRILOSEC) 40 MG capsule TAKE ONE CAPSULE BY MOUTH ONCE DAILY 90 capsule 3  . pravastatin (PRAVACHOL) 20 MG tablet Take 20 mg by mouth every evening.   99  . pseudoephedrine (SUDAFED) 120 MG 12 hr tablet Take 120 mg by mouth at bedtime as needed for congestion.    . topiramate (TOPAMAX) 25 MG tablet Take 1 tablet (25 mg total) by mouth 2 (two) times daily. 60 tablet 3  . traMADol (ULTRAM) 50 MG tablet Take 2 tablets (100 mg total) by mouth every 8 (eight) hours as needed for pain. 180 tablet 5  . venlafaxine XR (EFFEXOR-XR) 150 MG 24 hr capsule TAKE TWO (2) CAPSULES BY MOUTH AT BEDTIME. 60 capsule 2  . EPINEPHrine (EPIPEN IJ) Inject 1 Dose as directed as needed (allergic reaction).     [name] LITE TOUCH PEN NEEDLES 31G X 5 MM MISC USE ONE DAILY. 100 each 5  . Melatonin 5 MG TABS Take 5 mg by mouth at bedtime as needed (sleep).    . mupirocin ointment (BACTROBAN) 2 % APPLY AS DIRECTED TWICE DAILY AS NEEDED FOR SORES  5  . promethazine (PHENERGAN) 25 MG tablet Take 25 mg by mouth at bedtime as needed for nausea or vomiting.       Results for orders placed or performed during the hospital encounter of 04/25/16 (from the past 48 hour(s))  Glucose, capillary     Status: Abnormal   Collection Time: 04/25/16  7:06 AM  Result Value Ref Range   Glucose-Capillary 173 (H) 65 - 99 mg/dL   No results found.  ROS  Blood pressure (!) 152/73, pulse 96, temperature 98.5 F (36.9 C), temperature source Oral, resp. rate 16, height 5\' 4"  (1.626 m), weight 253 lb (114.8 kg), SpO2 98 %. Physical Exam  Constitutional: She appears well-developed and well-nourished.  HENT:   Mouth/Throat: Oropharynx is clear and moist.  Eyes: Conjunctivae are normal. No scleral icterus.  Cardiovascular: Normal rate, regular rhythm and normal heart sounds.   No murmur heard. Respiratory: Effort normal and breath sounds normal.  GI:  Two skin excoriations below the level of umbilicus close to midline. Abdomen is soft and nontender without organomegaly or masses.  Musculoskeletal: She exhibits no edema.  Lymphadenopathy:    She has no cervical adenopathy.  Neurological: She  is alert.  Skin: Skin is warm and dry.     Assessment/Plan Noncardiac chest pain. Chronic GERD. Diagnostic EGD.  Hildred Laser, MD 04/25/2016, 7:33 AM

## 2016-04-25 NOTE — Discharge Instructions (Signed)
Resume usual medications and diet. No driving for 24 hours. Physician will call with biopsy results and further recommendations.  Esophagogastroduodenoscopy, Care After Introduction Refer to this sheet in the next few weeks. These instructions provide you with information about caring for yourself after your procedure. Your health care provider may also give you more specific instructions. Your treatment has been planned according to current medical practices, but problems sometimes occur. Call your health care provider if you have any problems or questions after your procedure. What can I expect after the procedure? After the procedure, it is common to have:  A sore throat.  Nausea.  Bloating.  Dizziness.  Fatigue. Follow these instructions at home:  Do not eat or drink anything until the numbing medicine (local anesthetic) has worn off and your gag reflex has returned. You will know that the local anesthetic has worn off when you can swallow comfortably.  Do not drive for 24 hours if you received a medicine to help you relax (sedative).  If your health care provider took a tissue sample for testing during the procedure, make sure to get your test results. This is your responsibility. Ask your health care provider or the department performing the test when your results will be ready.  Keep all follow-up visits as told by your health care provider. This is important. Contact a health care provider if:  You cannot stop coughing.  You are not urinating.  You are urinating less than usual. Get help right away if:  You have trouble swallowing.  You cannot eat or drink.  You have throat or chest pain that gets worse.  You are dizzy or light-headed.  You faint.  You have nausea or vomiting.  You have chills.  You have a fever.  You have severe abdominal pain.  You have black, tarry, or bloody stools. This information is not intended to replace advice given to you by  your health care provider. Make sure you discuss any questions you have with your health care provider. Document Released: 03/18/2012 Document Revised: 09/07/2015 Document Reviewed: 02/23/2015  2017 Elsevier  PATIENT INSTRUCTIONS POST-ANESTHESIA  IMMEDIATELY FOLLOWING SURGERY:  Do not drive or operate machinery for the first twenty four hours after surgery.  Do not make any important decisions for twenty four hours after surgery or while taking narcotic pain medications or sedatives.  If you develop intractable nausea and vomiting or a severe headache please notify your doctor immediately.  FOLLOW-UP:  Please make an appointment with your surgeon as instructed. You do not need to follow up with anesthesia unless specifically instructed to do so.  WOUND CARE INSTRUCTIONS (if applicable):  Keep a dry clean dressing on the anesthesia/puncture wound site if there is drainage.  Once the wound has quit draining you may leave it open to air.  Generally you should leave the bandage intact for twenty four hours unless there is drainage.  If the epidural site drains for more than 36-48 hours please call the anesthesia department.  QUESTIONS?:  Please feel free to call your physician or the hospital operator if you have any questions, and they will be happy to assist you.

## 2016-04-25 NOTE — Op Note (Signed)
[facility] Patient Name: Norma [name] Procedure Date: 04/25/2016 7:33 AM MRN: [id number] Date of Birth: 11-18-1950 Attending MD: Hildred Laser , MD CSN: [id number] Age: 66 Admit Type: Outpatient Procedure:                Upper GI endoscopy Indications:              Gastro-esophageal reflux disease, Chest pain (non                            cardiac) Providers:                Hildred Laser, MD, Otis Peak B. Sharon Seller, RN, Isabella Stalling, Technician Referring MD:             Jasper Loser. Luan Pulling, MD Medicines:                Cetacaine spray, Meperidine 50 mg IV, Midazolam 7                            mg IV Complications:            No immediate complications. Estimated Blood Loss:     Estimated blood loss was minimal. Procedure:                Pre-Anesthesia Assessment:                           - Prior to the procedure, a History and Physical                            was performed, and patient medications and                            allergies were reviewed. The patient's tolerance of                            previous anesthesia was also reviewed. The risks                            and benefits of the procedure and the sedation                            options and risks were discussed with the patient.                            All questions were answered, and informed consent                            was obtained. Prior Anticoagulants: The patient                            last took aspirin 7 days prior to the procedure.  ASA Grade Assessment: III - A patient with severe                            systemic disease. After reviewing the risks and                            benefits, the patient was deemed in satisfactory                            condition to undergo the procedure.                           After obtaining informed consent, the endoscope was                            passed under direct vision.  Throughout the                            procedure, the patient's blood pressure, pulse, and                            oxygen saturations were monitored continuously. The                            [id number]) scope was introduced through the                            mouth, and advanced to the second part of duodenum.                            The upper GI endoscopy was accomplished without                            difficulty. The patient tolerated the procedure                            well. Scope In: 7:48:05 AM Scope Out: 7:55:05 AM Total Procedure Duration: 0 hours 7 minutes 0 seconds  Findings:      The examined esophagus was normal.      The Z-line was irregular and was found 35 cm from the incisors.      A 2 cm hiatal hernia was present.      Mild portal hypertensive gastropathy was found in the gastric fundus and       in the gastric body. Biopsies were taken with a cold forceps for       histology.      Patchy mildly erythematous mucosa without bleeding was found in the       prepyloric region of the stomach.      The exam of the stomach was otherwise normal.      The duodenal bulb and second portion of the duodenum were normal. Impression:               - Normal esophagus.                           -  Z-line irregular, 35 cm from the incisors.                           - 2 cm hiatal hernia.                           - Portal hypertensive gastropathy. Biopsied.                           - Erythematous mucosa in the prepyloric region of                            the stomach.                           - Normal duodenal bulb and second portion of the                            duodenum. Moderate Sedation:      Moderate (conscious) sedation was administered by the endoscopy nurse       and supervised by the endoscopist. The following parameters were       monitored: oxygen saturation, heart rate, blood pressure, CO2       capnography and response to care. Total  physician intraservice time was       14 minutes. Recommendation:           - Patient has a contact number available for                            emergencies. The signs and symptoms of potential                            delayed complications were discussed with the                            patient. Return to normal activities tomorrow.                            Written discharge instructions were provided to the                            patient.                           - Resume previous diet today.                           - Continue present medications.                           - Await pathology results. Procedure Code(s):        --- Professional ---                           [phone], Esophagogastroduodenoscopy, flexible,  transoral; with biopsy, single or multiple                           99152, Moderate sedation services provided by the                            same physician or other qualified health care                            professional performing the diagnostic or                            therapeutic service that the sedation supports,                            requiring the presence of an independent trained                            observer to assist in the monitoring of the                            patient's level of consciousness and physiological                            status; initial 15 minutes of intraservice time,                            patient age 65 years or older Diagnosis Code(s):        --- Professional ---                           K22.8, Other specified diseases of esophagus                           K44.9, Diaphragmatic hernia without obstruction or                            gangrene                           K76.6, Portal hypertension                           K31.89, Other diseases of stomach and duodenum                           K21.9, Gastro-esophageal reflux disease without                             esophagitis                           R07.89, Other chest pain CPT copyright 2016 [employer]. All rights reserved. The codes documented in this report are preliminary and upon coder review may  be revised to meet current compliance requirements. Hildred Laser, MD Hildred Laser, MD 04/25/2016  8:05:07 AM This report has been signed electronically. Number of Addenda: 0

## 2016-04-26 ENCOUNTER — Encounter (HOSPITAL_COMMUNITY): Payer: Self-pay | Admitting: Internal Medicine

## 2016-05-03 ENCOUNTER — Other Ambulatory Visit: Payer: Self-pay | Admitting: Neurology

## 2016-05-07 DIAGNOSIS — E1142 Type 2 diabetes mellitus with diabetic polyneuropathy: Secondary | ICD-10-CM | POA: Diagnosis not present

## 2016-05-07 DIAGNOSIS — L84 Corns and callosities: Secondary | ICD-10-CM | POA: Diagnosis not present

## 2016-05-07 DIAGNOSIS — B351 Tinea unguium: Secondary | ICD-10-CM | POA: Diagnosis not present

## 2016-05-13 ENCOUNTER — Other Ambulatory Visit (INDEPENDENT_AMBULATORY_CARE_PROVIDER_SITE_OTHER): Payer: Self-pay | Admitting: *Deleted

## 2016-05-13 DIAGNOSIS — R0789 Other chest pain: Secondary | ICD-10-CM

## 2016-05-13 DIAGNOSIS — K219 Gastro-esophageal reflux disease without esophagitis: Secondary | ICD-10-CM

## 2016-05-13 DIAGNOSIS — R110 Nausea: Secondary | ICD-10-CM

## 2016-05-14 DIAGNOSIS — G2581 Restless legs syndrome: Secondary | ICD-10-CM | POA: Diagnosis not present

## 2016-05-14 DIAGNOSIS — J449 Chronic obstructive pulmonary disease, unspecified: Secondary | ICD-10-CM | POA: Diagnosis not present

## 2016-05-14 DIAGNOSIS — E1165 Type 2 diabetes mellitus with hyperglycemia: Secondary | ICD-10-CM | POA: Diagnosis not present

## 2016-05-14 DIAGNOSIS — R079 Chest pain, unspecified: Secondary | ICD-10-CM | POA: Diagnosis not present

## 2016-05-17 ENCOUNTER — Encounter (HOSPITAL_COMMUNITY)
Admission: RE | Admit: 2016-05-17 | Discharge: 2016-05-17 | Disposition: A | Discharge status: Home / Self Care | Payer: PPO | Source: Ambulatory Visit | Attending: Cardiology | Admitting: Cardiology

## 2016-05-17 DIAGNOSIS — R110 Nausea: Secondary | ICD-10-CM

## 2016-05-17 DIAGNOSIS — R079 Chest pain, unspecified: Secondary | ICD-10-CM | POA: Diagnosis not present

## 2016-05-17 DIAGNOSIS — R0789 Other chest pain: Secondary | ICD-10-CM

## 2016-05-17 DIAGNOSIS — K219 Gastro-esophageal reflux disease without esophagitis: Secondary | ICD-10-CM

## 2016-05-17 DIAGNOSIS — R918 Other nonspecific abnormal finding of lung field: Secondary | ICD-10-CM | POA: Diagnosis not present

## 2016-05-17 LAB — POCT I-STAT CREATININE: CREATININE: 0.9 mg/dL (ref 0.44–1.00)

## 2016-05-17 MED ORDER — IOPAMIDOL (ISOVUE-300) INJECTION 61%
75.0000 mL | Freq: Once | INTRAVENOUS | Status: AC | PRN
  Administered 2016-05-17: 75 mL via INTRAVENOUS

## 2016-05-31 ENCOUNTER — Other Ambulatory Visit: Payer: Self-pay | Admitting: Neurology

## 2016-06-03 ENCOUNTER — Other Ambulatory Visit: Payer: Self-pay | Admitting: *Deleted

## 2016-06-03 MED ORDER — TOPIRAMATE 25 MG PO TABS
25.0000 mg | ORAL_TABLET | Freq: Two times a day (BID) | ORAL | 3 refills | Status: DC
Start: 2016-06-03 — End: 2016-06-21

## 2016-06-05 ENCOUNTER — Ambulatory Visit: Payer: PPO | Admitting: Nurse Practitioner

## 2016-06-10 ENCOUNTER — Encounter: Payer: Self-pay | Admitting: Cardiology

## 2016-06-10 ENCOUNTER — Ambulatory Visit (INDEPENDENT_AMBULATORY_CARE_PROVIDER_SITE_OTHER): Payer: PPO | Admitting: Cardiology

## 2016-06-10 VITALS — BP 95/63 | HR 89 | Ht 64.0 in | Wt 251.6 lb

## 2016-06-10 DIAGNOSIS — I447 Left bundle-branch block, unspecified: Secondary | ICD-10-CM | POA: Diagnosis not present

## 2016-06-10 DIAGNOSIS — R0789 Other chest pain: Secondary | ICD-10-CM

## 2016-06-10 NOTE — Progress Notes (Signed)
Clinical Summary Ms. Poitra is a 66 y.o.female seen tpday for follow up of the following medical problems.   1. Chest pain - nuclear stress 2007 without clear ischemia.  - symptoms started in early August. Cramping pain epigastric area that spreads throughout chest into both arms and left side of neck. 10/10 in severity. Occurs at rest or with exertion. Can have some SOB. Can be worst with laying down. No clear association with eating. - can have some DOE at times, though limited due to chronic back and leg pains. Fairly sedentary lifestyle. - No LE edema. - pain occurs daily, can be up to two times daily  - normal stress test 03/2016 - EGD Jan 2018: erythemaous mucosa  - still with pains at times. She states after changing gabapentin to lyrica with some improvement initailly however pain has returned. Pain epigastric radiating to midback.   2. Chronic LBBB   Past Medical History:  Diagnosis Date  . Anemia   . Bipolar disorder ([place])   . Bulging lumbar disc    L3-4  . Chronic daily headache   . Chronic low back pain 09/20/2014  . COPD (chronic obstructive pulmonary disease) ([place])   . Degenerative arthritis   . Depression   . Diabetes mellitus   . Diabetes mellitus, type II ([place])   . DM type 2 with diabetic peripheral neuropathy ([place]) 09/20/2014  . Dyslipidemia   . Gastroparesis   . GERD (gastroesophageal reflux disease)   . Hypertension   . Hypothyroidism   . IBS (irritable bowel syndrome)   . Memory difficulty 09/20/2014  . Morbid obesity ([place])   . Neuropathy ([place])   . Obstructive sleep apnea on CPAP   . Restless legs syndrome (RLS) 09/17/2012     Allergies  Allergen Reactions  . Amoxicillin Anaphylaxis  . Hydrocodone Anaphylaxis  . Primidone Anaphylaxis  . Depacon [Valproic Acid]     Causes falls   . Lisinopril     Dr took off as precaution     Current Outpatient Prescriptions  Medication Sig Dispense Refill  . albuterol (PROVENTIL HFA;VENTOLIN HFA) 108  (90 Base) MCG/ACT inhaler Inhale 2 puffs into the lungs every 6 (six) hours as needed for wheezing or shortness of breath.    . Aspirin-Acetaminophen-Caffeine (EXCEDRIN MIGRAINE PO) Take 2 tablets by mouth 3 (three) times daily as needed (pain). Take with tramadol    . buPROPion (WELLBUTRIN XL) 300 MG 24 hr tablet TAKE ONE TABLET BY MOUTH EVERY MORNING. 30 tablet 2  . butalbital-acetaminophen-caffeine (FIORICET WITH CODEINE) 50-325-40-30 MG capsule Take 1 capsule by mouth every 4 (four) hours as needed for headache.    . canagliflozin (INVOKANA) 100 MG TABS tablet Take 1 tablet (100 mg total) by mouth daily before breakfast. 30 tablet 3  . diclofenac sodium (VOLTAREN) 1 % GEL Apply 1 application topically as needed (pain).     [name] EPINEPHrine (EPIPEN IJ) Inject 1 Dose as directed as needed (allergic reaction).     . gabapentin (NEURONTIN) 300 MG capsule Take 1,200 mg by mouth 2 (two) times daily.    . Insulin Glargine (TOUJEO SOLOSTAR) 300 UNIT/ML SOPN Inject 20 Units into the skin at bedtime.    . lamoTRIgine (LAMICTAL) 150 MG tablet TAKE ONE TABLET BY MOUTH AT BEDTIME. 30 tablet 2  . levothyroxine (SYNTHROID, LEVOTHROID) 100 MCG tablet Take 100 mcg by mouth at bedtime.     [name] LITE TOUCH PEN NEEDLES 31G X 5 MM MISC USE ONE DAILY. 100  each 5  . losartan (COZAAR) 50 MG tablet Take 50 mg by mouth at bedtime.     . meclizine (ANTIVERT) 25 MG tablet Take 1 tablet (25 mg total) by mouth 3 (three) times daily as needed for dizziness. (Patient taking differently: Take 25 mg by mouth daily as needed for dizziness. ) 30 tablet 0  . Melatonin 5 MG TABS Take 5 mg by mouth at bedtime as needed (sleep).    . metFORMIN (GLUCOPHAGE) 500 MG tablet Take 2 tablets (1,000 mg total) by mouth 2 (two) times daily with a meal. 60 tablet 3  . mupirocin ointment (BACTROBAN) 2 % APPLY AS DIRECTED TWICE DAILY AS NEEDED FOR SORES  5  . nystatin-triamcinolone ointment (MYCOLOG) APPLY TWICE DAILY (Patient taking differently: Apply  as needed for rash) 60 g 0  . omeprazole (PRILOSEC) 40 MG capsule TAKE ONE CAPSULE BY MOUTH ONCE DAILY 90 capsule 3  . pravastatin (PRAVACHOL) 20 MG tablet Take 20 mg by mouth every evening.   99  . promethazine (PHENERGAN) 25 MG tablet Take 25 mg by mouth at bedtime as needed for nausea or vomiting.     . pseudoephedrine (SUDAFED) 120 MG 12 hr tablet Take 120 mg by mouth at bedtime as needed for congestion.    . topiramate (TOPAMAX) 25 MG tablet Take 1 tablet (25 mg total) by mouth 2 (two) times daily. 60 tablet 3  . traMADol (ULTRAM) 50 MG tablet Take 2 tablets (100 mg total) by mouth every 8 (eight) hours as needed for pain. 180 tablet 5  . venlafaxine XR (EFFEXOR-XR) 150 MG 24 hr capsule TAKE TWO (2) CAPSULES BY MOUTH AT BEDTIME. 60 capsule 2   No current facility-administered medications for this visit.      Past Surgical History:  Procedure Laterality Date  . ABDOMINAL HYSTERECTOMY    . ARTHROSCOPY KNEE W/ DRILLING  06/2011   and Decemer of 2013.  [name] Carpel tunnel  1980's  . CHOLECYSTECTOMY    . ESOPHAGOGASTRODUODENOSCOPY N/A 04/25/2016   Procedure: ESOPHAGOGASTRODUODENOSCOPY (EGD);  Surgeon: Rogene Houston, MD;  [facility];  Service: Endoscopy;  Laterality: N/A;  730  . lesions     Removed from tongue  . RECTAL SURGERY     fissure  . SHOULDER SURGERY     arthroscopy in March of this year     Allergies  Allergen Reactions  . Amoxicillin Anaphylaxis  . Hydrocodone Anaphylaxis  . Primidone Anaphylaxis  . Depacon [Valproic Acid]     Causes falls   . Lisinopril     Dr took off as precaution      Family History  Problem Relation Age of Onset  . Hypertension Mother   . Lymphoma Mother   . Depression Mother   . Arthritis Father   . Alcohol abuse Father   . Hypertension Sister   . Cancer Brother     kidney and lung  . Alcohol abuse Brother   . Alcohol abuse Paternal Uncle   . Alcohol abuse Paternal Grandfather   . Alcohol abuse Paternal Grandmother        Social History Ms. Urbin reports that she quit smoking about 45 years ago. Her smoking use included Cigarettes. She has never used smokeless tobacco. Ms. Speake reports that she does not drink alcohol.   Review of Systems CONSTITUTIONAL: No weight loss, fever, chills, weakness or fatigue.  HEENT: Eyes: No visual loss, blurred vision, double vision or yellow sclerae.No hearing loss, sneezing, congestion, runny nose  or sore throat.  SKIN: No rash or itching.  CARDIOVASCULAR: per HPI RESPIRATORY: No shortness of breath, cough or sputum.  GASTROINTESTINAL: No anorexia, nausea, vomiting or diarrhea. No abdominal pain or blood.  GENITOURINARY: No burning on urination, no polyuria NEUROLOGICAL: No headache, dizziness, syncope, paralysis, ataxia, numbness or tingling in the extremities. No change in bowel or bladder control.  MUSCULOSKELETAL: No muscle, back pain, joint pain or stiffness.  LYMPHATICS: No enlarged nodes. No history of splenectomy.  PSYCHIATRIC: No history of depression or anxiety.  ENDOCRINOLOGIC: No reports of sweating, cold or heat intolerance. No polyuria or polydipsia.  [name]   Physical Examination Vitals:   06/10/16 1544  BP: 95/63  Pulse: 89   Vitals:   06/10/16 1544  Weight: 251 lb 9.6 oz (114.1 kg)  Height: 5\' 4"  (1.626 m)    Gen: resting comfortably, no acute distress HEENT: no scleral icterus, pupils equal round and reactive, no palptable cervical adenopathy,  CV:RRR, no m/r/g, no jvd Resp: Clear to auscultation bilaterally GI: abdomen is soft, non-tender, non-distended, normal bowel sounds, no hepatosplenomegaly MSK: extremities are warm, no edema.  Skin: warm, no rash Neuro:  no focal deficits Psych: appropriate affect   Diagnostic Studies 03/2016 Lexiscan  Probable normal perfusion and mild soft tissue attenuation (breast) No significant ischemia or evidence for scar  The left ventricular ejection fraction is normal (55-65%).  Low risk  scan.    Assessment and Plan   1. Chest pain - negative stress test, recent EGD without significant findings - chest pain not typical for cardiac related chest pain - she reports some improvement after starting lyrica, but symptoms have returned. Has appt coming up with neuro - at this time do not see strong indication for cath in setting of atypical symptoms and normal stress test. Continue to monitor at this time.    2. LBBB - negative stress test recently, no evidence of ischemic etiology at this time.      Arnoldo Lenis, M.D.

## 2016-06-10 NOTE — Patient Instructions (Signed)
Your physician recommends that you schedule a follow-up appointment in: 2 MONTHS WITH DR BRANCH  Your physician recommends that you continue on your current medications as directed. Please refer to the Current Medication list given to you today.  Thank you for choosing [facility]!!    

## 2016-06-21 ENCOUNTER — Ambulatory Visit (INDEPENDENT_AMBULATORY_CARE_PROVIDER_SITE_OTHER): Payer: PPO | Admitting: Nurse Practitioner

## 2016-06-21 ENCOUNTER — Encounter: Payer: Self-pay | Admitting: Nurse Practitioner

## 2016-06-21 VITALS — BP 113/65 | HR 81 | Wt 248.6 lb

## 2016-06-21 DIAGNOSIS — R251 Tremor, unspecified: Secondary | ICD-10-CM

## 2016-06-21 DIAGNOSIS — G4733 Obstructive sleep apnea (adult) (pediatric): Secondary | ICD-10-CM | POA: Diagnosis not present

## 2016-06-21 DIAGNOSIS — G2581 Restless legs syndrome: Secondary | ICD-10-CM

## 2016-06-21 DIAGNOSIS — R42 Dizziness and giddiness: Secondary | ICD-10-CM | POA: Diagnosis not present

## 2016-06-21 MED ORDER — TOPIRAMATE 25 MG PO TABS
ORAL_TABLET | ORAL | 6 refills | Status: DC
Start: 2016-06-21 — End: 2017-02-08

## 2016-06-21 NOTE — Progress Notes (Signed)
[facility]  PATIENT: Norma [name] DOB: 1950/04/17   REASON FOR VISIT: Follow-up for tremor HISTORY FROM: Patient    HISTORY OF PRESENT ILLNESS:UPDATE 06/21/2016 [name], 66 year old [name] returns for follow-up with history of essential tremor morbid obesity diabetes obstructive sleep apnea with CPAP. When last seen by Dr. Jannifer [name] in October she was having increased sleepiness and fatigue .she was slowly titrated off of clonazepam. Her daytime drowsiness has improved. She was also placed on Topamax 25 mg twice a day for tremor. Her tremor is in fair control. The patient will have good days and bad days with the tremor, occasionally she has difficulty performing handwriting, some days she does much better. Her handwriting on intake form today is very little legible  She is also on Lyrica restless leg syndrome. The patient is not bothered with restless leg syndrome much at this time. She indicates that her CPAP machine was interrogated recently at Dr Norma [name] office , no adjustments were made to the settings. she also complains of some dizziness however on further questioning she does not have a lot of by mouth intake. She reports her CBGs this morning was 150. She also has a history of bipolar disorder which is currently being treated by her primary care physician She comes back to this office for an evaluation.  Her problems with memory also improved coming off of the clonazepam   REVIEW OF SYSTEMS: Full 14 system review of systems performed and notable only for those listed, all others are neg:  Constitutional: neg  Cardiovascular: neg Ear/Nose/Throat: neg  Skin: neg Eyes: neg Respiratory:Shortness of breath Gastroitestinal: neg  Hematology/Lymphatic: neg  Endocrine: neg Musculoskeletal: joint pain Allergy/Immunology: neg Neurological dizziness tremors Psychiatric depression SleepObstructive sleep apnea with CPAP  ALLERGIES: Allergies  Allergen Reactions    . Amoxicillin Anaphylaxis  . Hydrocodone Anaphylaxis  . Primidone Anaphylaxis  . Depacon [Valproic Acid]     Causes falls   . Lisinopril     Dr took off as precaution    HOME MEDICATIONS: Outpatient Medications Prior to Visit  Medication Sig Dispense Refill  . albuterol (PROVENTIL HFA;VENTOLIN HFA) 108 (90 Base) MCG/ACT inhaler Inhale 2 puffs into the lungs every 6 (six) hours as needed for wheezing or shortness of breath.    . Aspirin-Acetaminophen-Caffeine (EXCEDRIN MIGRAINE PO) Take 2 tablets by mouth 3 (three) times daily as needed (pain). Take with tramadol    . buPROPion (WELLBUTRIN XL) 300 MG 24 hr tablet TAKE ONE TABLET BY MOUTH EVERY MORNING. 30 tablet 2  . butalbital-acetaminophen-caffeine (FIORICET WITH CODEINE) 50-325-40-30 MG capsule Take 1 capsule by mouth every 4 (four) hours as needed for headache.    . canagliflozin (INVOKANA) 100 MG TABS tablet Take 1 tablet (100 mg total) by mouth daily before breakfast. 30 tablet 3  . diclofenac sodium (VOLTAREN) 1 % GEL Apply 1 application topically as needed (pain).     [name] EPINEPHrine (EPIPEN IJ) Inject 1 Dose as directed as needed (allergic reaction).     . Insulin Glargine (TOUJEO SOLOSTAR) 300 UNIT/ML SOPN Inject 20 Units into the skin at bedtime.    . lamoTRIgine (LAMICTAL) 150 MG tablet TAKE ONE TABLET BY MOUTH AT BEDTIME. 30 tablet 2  . levothyroxine (SYNTHROID, LEVOTHROID) 100 MCG tablet Take 100 mcg by mouth at bedtime.     [name] LITE TOUCH PEN NEEDLES 31G X 5 MM MISC USE ONE DAILY. 100 each 5  . losartan (COZAAR) 50 MG tablet Take 50 mg by mouth at  bedtime.     . meclizine (ANTIVERT) 25 MG tablet Take 1 tablet (25 mg total) by mouth 3 (three) times daily as needed for dizziness. (Patient taking differently: Take 25 mg by mouth daily as needed for dizziness. ) 30 tablet 0  . Melatonin 5 MG TABS Take 5 mg by mouth at bedtime as needed (sleep).    . metFORMIN (GLUCOPHAGE) 500 MG tablet Take 2 tablets (1,000 mg total) by mouth 2  (two) times daily with a meal. 60 tablet 3  . mupirocin ointment (BACTROBAN) 2 % APPLY AS DIRECTED TWICE DAILY AS NEEDED FOR SORES  5  . nystatin-triamcinolone ointment (MYCOLOG) APPLY TWICE DAILY (Patient taking differently: Apply as needed for rash) 60 g 0  . oxymetazoline (AFRIN) 0.05 % nasal spray Place 1 spray into both nostrils as needed for congestion.    . pravastatin (PRAVACHOL) 20 MG tablet Take 20 mg by mouth every evening.   99  . pregabalin (LYRICA) 75 MG capsule Take 75 mg by mouth 3 (three) times daily.    . promethazine (PHENERGAN) 25 MG tablet Take 25 mg by mouth at bedtime as needed for nausea or vomiting.     . pseudoephedrine (SUDAFED) 120 MG 12 hr tablet Take 120 mg by mouth at bedtime as needed for congestion.    . topiramate (TOPAMAX) 25 MG tablet Take 1 tablet (25 mg total) by mouth 2 (two) times daily. 60 tablet 3  . traMADol (ULTRAM) 50 MG tablet Take 2 tablets (100 mg total) by mouth every 8 (eight) hours as needed for pain. 180 tablet 5  . venlafaxine XR (EFFEXOR-XR) 150 MG 24 hr capsule TAKE TWO (2) CAPSULES BY MOUTH AT BEDTIME. 60 capsule 2   No facility-administered medications prior to visit.     PAST MEDICAL HISTORY: Past Medical History:  Diagnosis Date  . Anemia   . Bipolar disorder ([place])   . Bulging lumbar disc    L3-4  . Chronic daily headache   . Chronic low back pain 09/20/2014  . COPD (chronic obstructive pulmonary disease) ([place])   . Degenerative arthritis   . Depression   . Diabetes mellitus   . Diabetes mellitus, type II ([place])   . DM type 2 with diabetic peripheral neuropathy ([place]) 09/20/2014  . Dyslipidemia   . Gastroparesis   . GERD (gastroesophageal reflux disease)   . Hypertension   . Hypothyroidism   . IBS (irritable bowel syndrome)   . Memory difficulty 09/20/2014  . Morbid obesity ([place])   . Neuropathy ([place])   . Obstructive sleep apnea on CPAP   . Restless legs syndrome (RLS) 09/17/2012    PAST SURGICAL HISTORY: Past Surgical  History:  Procedure Laterality Date  . ABDOMINAL HYSTERECTOMY    . ARTHROSCOPY KNEE W/ DRILLING  06/2011   and Decemer of 2013.  [name] Carpel tunnel  1980's  . CHOLECYSTECTOMY    . ESOPHAGOGASTRODUODENOSCOPY N/A 04/25/2016   Procedure: ESOPHAGOGASTRODUODENOSCOPY (EGD);  Surgeon: Rogene Houston, MD;  [facility];  Service: Endoscopy;  Laterality: N/A;  730  . lesions     Removed from tongue  . RECTAL SURGERY     fissure  . SHOULDER SURGERY     arthroscopy in March of this year    FAMILY HISTORY: Family History  Problem Relation Age of Onset  . Hypertension Mother   . Lymphoma Mother   . Depression Mother   . Arthritis Father   . Alcohol abuse Father   . Hypertension Sister   .  Cancer Brother     kidney and lung  . Alcohol abuse Brother   . Alcohol abuse Paternal Uncle   . Alcohol abuse Paternal Grandfather   . Alcohol abuse Paternal Grandmother     SOCIAL HISTORY: Social History   Social History  . Marital status: Single    Spouse name: N/A  . Number of children: 0  . Years of education: 91   Occupational History  .  [employer]   Social History Main Topics  . Smoking status: Former Smoker    Types: Cigarettes    Quit date: 02/04/1971  . Smokeless tobacco: Never Used     Comment: smoked 2 cigarettes a day  . Alcohol use No  . Drug use: No  . Sexual activity: No   Other Topics Concern  . Not on file   Social History Narrative   Patient lives at home alone.    Patient has no children.    Patient has her masters in nursing.    Patient is single.    Patient drinks about 2 glasses of tea daily.   Patient is right handed.     PHYSICAL EXAM  Vitals:   06/21/16 1021  BP: 113/65  Pulse: 81  Weight: 248 lb 9.6 oz (112.8 kg)   Body mass index is 42.Norma kg/m.  Generalized: Well developed, Morbidly obese in no acute distress  Head: normocephalic and atraumatic,. Oropharynx benign  Neck: Supple, no carotid bruits  Musculoskeletal:  No deformity   Neurological examination   Mentation: Alert oriented to time, place, history taking. Attention span and concentration appropriate. Recent and remote memory intact.  Follows all commands speech and language fluent.   Cranial nerve II-XII: .Pupils were equal round reactive to light extraocular movements were full, visual field were full on confrontational test. Facial sensation and strength were normal. hearing was intact to finger rubbing bilaterally. Uvula tongue midline. head turning and shoulder shrug were normal and symmetric.Tongue protrusion into cheek strength was normal. Motor: normal bulk and tone, full strength in the BUE, BLE, fine finger movements normal, no pronator drift. No focal weakness Sensory: normal and symmetric to light touon the face arms and legs Coordination  finger-nose-finger, heel-to-shin bilaterally, no dysmetria, no significant tremor seen with handwriting or spiral  Reflexes: Depressed upper and lower , plantar responses were flexor bilaterally. Gait and Station: Rising up from seated position without assistance, normal stance,  moderate stride, good arm swing, smooth turning, able to perform tiptoe, and heel walking without difficulty. Tandem gait is steady, no assistive device  DIAGNOSTIC DATA (LABS, IMAGING, TESTING) - I reviewed patient records, labs, notes, testing and imaging myself where available.  No results found for: WBC, HGB, HCT, MCV, PLT    Component Value Date/Time   NA 139 03/27/2016 1039   K 4.7 03/27/2016 1039   CL 104 03/27/2016 1039   CO2 24 03/27/2016 1039   GLUCOSE 188 (H) 03/27/2016 1039   BUN 12 03/27/2016 1039   CREATININE 0.90 05/17/2016 1407   CREATININE 0.96 03/27/2016 1039   CALCIUM 9.1 03/27/2016 1039   PROT 6.5 03/27/2016 1039   ALBUMIN 3.6 03/27/2016 1039   AST 21 03/27/2016 1039   ALT 28 03/27/2016 1039   ALKPHOS 62 03/27/2016 1039   BILITOT 0.2 03/27/2016 1039   GFRNONAA 55 (L) 02/05/2011 1500   GFRAA 63  (L) 02/05/2011 1500    Lab Results  Component Value Date   HGBA1C 6.6 (H) 03/27/2016    Lab Results  Component Value Date   TSH 1.766 05/12/2015      ASSESSMENT AND PLAN  66 y.o. year old [name]  has a past medical history of ; Bipolar disorder ([place]); Essential tremor, sleep apnea on CPAP, restless leg syndrome. Excessive daytime drowsiness and memory disturbance are improved with being off clonazepam.   Continue Topamax for tremor, increase to 75 mg total dose Increase water intake blood pressure in the office today 113/65 with complaints of dizziness Continue Lyrica for peripheral neuropathy, restless legs Try to exercise for overhealth and well being She was also given information about dehydration and the importance of having adequate fluid intake  F/U in 6 months  I spent 25 minutes in total face to face time with the patient more than 50% of which was spent counseling and coordination of care, reviewing test results reviewing medications and discussing and reviewing the diagnosis of essential tremor, dizziness due to  dehydration , Dennie Bible, [facility], [facility], APRN  [facility] [address] [place],  [address] [phone]

## 2016-06-21 NOTE — Patient Instructions (Addendum)
Continue Topamax for tremor, increase to 75 mg total dose Increase water intake Continue Lyrica for peripheral neuropathy Try to exercise for overhealth and well being F/U in 6 months   Dehydration, Adult Dehydration is a condition in which there is not enough fluid or water in the body. This happens when you lose more fluids than you take in. Important organs, such as the kidneys, brain, and heart, cannot function without a proper amount of fluids. Any loss of fluids from the body can lead to dehydration. Dehydration can range from mild to severe. This condition should be treated right away to prevent it from becoming severe. What are the causes? This condition may be caused by:  Vomiting.  Diarrhea.  Excessive sweating, such as from heat exposure or exercise.  Not drinking enough fluid, especially:  When ill.  While doing activity that requires a lot of energy.  Excessive urination.  Fever.  Infection.  Certain medicines, such as medicines that cause the body to lose excess fluid (diuretics).  Inability to access safe drinking water.  Reduced physical ability to get adequate water and food. What increases the risk? This condition is more likely to develop in people:  Who have a poorly controlled long-term (chronic) illness, such as diabetes, heart disease, or kidney disease.  Who are age 88 or older.  Who are disabled.  Who live in a place with high altitude.  Who play endurance sports. What are the signs or symptoms? Symptoms of mild dehydration may include:   Thirst.  Dry lips.  Slightly dry mouth.  Dry, warm skin.  Dizziness. Symptoms of moderate dehydration may include:   Very dry mouth.  Muscle cramps.  Dark urine. Urine may be the color of tea.  Decreased urine production.  Decreased tear production.  Heartbeat that is irregular or faster than normal (palpitations).  Headache.  Light-headedness, especially when you stand up from a  sitting position.  Fainting (syncope). Symptoms of severe dehydration may include:   Changes in skin, such as:  Cold and clammy skin.  Blotchy (mottled) or pale skin.  Skin that does not quickly return to normal after being lightly pinched and released (poor skin turgor).  Changes in body fluids, such as:  Extreme thirst.  No tear production.  Inability to sweat when body temperature is high, such as in hot weather.  Very little urine production.  Changes in vital signs, such as:  Weak pulse.  Pulse that is more than 100 beats a minute when sitting still.  Rapid breathing.  Low blood pressure.  Other changes, such as:  Sunken eyes.  Cold hands and feet.  Confusion.  Lack of energy (lethargy).  Difficulty waking up from sleep.  Short-term weight loss.  Unconsciousness. How is this diagnosed? This condition is diagnosed based on your symptoms and a physical exam. Blood and urine tests may be done to help confirm the diagnosis. How is this treated? Treatment for this condition depends on the severity. Mild or moderate dehydration can often be treated at home. Treatment should be started right away. Do not wait until dehydration becomes severe. Severe dehydration is an emergency and it needs to be treated in a hospital. Treatment for mild dehydration may include:   Drinking more fluids.  Replacing salts and minerals in your blood (electrolytes) that you may have lost. Treatment for moderate dehydration may include:   Drinking an oral rehydration solution (ORS). This is a drink that helps you replace fluids and electrolytes (rehydrate). It can  be found at pharmacies and retail stores. Treatment for severe dehydration may include:   Receiving fluids through an IV tube.  Receiving an electrolyte solution through a feeding tube that is passed through your nose and into your stomach (nasogastric tube, or NG tube).  Correcting any abnormalities in  electrolytes.  Treating the underlying cause of dehydration. Follow these instructions at home:  If directed by your health care provider, drink an ORS:  Make an ORS by following instructions on the package.  Start by drinking small amounts, about  cup (120 mL) every 5-10 minutes.  Slowly increase how much you drink until you have taken the amount recommended by your health care provider.  Drink enough clear fluid to keep your urine clear or pale yellow. If you were told to drink an ORS, finish the ORS first, then start slowly drinking other clear fluids. Drink fluids such as:  Water. Do not drink only water. Doing that can lead to having too little salt (sodium) in the body (hyponatremia).  Ice chips.  Fruit juice that you have added water to (diluted fruit juice).  Low-calorie sports drinks.  Avoid:  Alcohol.  Drinks that contain a lot of sugar. These include high-calorie sports drinks, fruit juice that is not diluted, and soda.  Caffeine.  Foods that are greasy or contain a lot of fat or sugar.  Take over-the-counter and prescription medicines only as told by your health care provider.  Do not take sodium tablets. This can lead to having too much sodium in the body (hypernatremia).  Eat foods that contain a healthy balance of electrolytes, such as bananas, oranges, potatoes, tomatoes, and spinach.  Keep all follow-up visits as told by your health care provider. This is important. Contact a health care provider if:  You have abdominal pain that:  Gets worse.  Stays in one area (localizes).  You have a rash.  You have a stiff neck.  You are more irritable than usual.  You are sleepier or more difficult to wake up than usual.  You feel weak or dizzy.  You feel very thirsty.  You have urinated only a small amount of very dark urine over 6-8 hours. Get help right away if:  You have symptoms of severe dehydration.  You cannot drink fluids without  vomiting.  Your symptoms get worse with treatment.  You have a fever.  You have a severe headache.  You have vomiting or diarrhea that:  Gets worse.  Does not go away.  You have blood or green matter (bile) in your vomit.  You have blood in your stool. This may cause stool to look black and tarry.  You have not urinated in 6-8 hours.  You faint.  Your heart rate while sitting still is over 100 beats a minute.  You have trouble breathing. This information is not intended to replace advice given to you by your health care provider. Make sure you discuss any questions you have with your health care provider. Document Released: 04/01/2005 Document Revised: 10/27/2015 Document Reviewed: 05/26/2015 Elsevier Interactive Patient Education  2017 [employer].

## 2016-06-21 NOTE — Progress Notes (Signed)
I have read the note, and I agree with the clinical assessment and plan.  Norma [name],Norma [name]   

## 2016-06-26 DIAGNOSIS — J449 Chronic obstructive pulmonary disease, unspecified: Secondary | ICD-10-CM | POA: Diagnosis not present

## 2016-06-26 DIAGNOSIS — F319 Bipolar disorder, unspecified: Secondary | ICD-10-CM | POA: Diagnosis not present

## 2016-06-26 DIAGNOSIS — R079 Chest pain, unspecified: Secondary | ICD-10-CM | POA: Diagnosis not present

## 2016-06-26 DIAGNOSIS — E1165 Type 2 diabetes mellitus with hyperglycemia: Secondary | ICD-10-CM | POA: Diagnosis not present

## 2016-07-10 ENCOUNTER — Other Ambulatory Visit: Payer: Self-pay | Admitting: "Endocrinology

## 2016-07-10 DIAGNOSIS — Z794 Long term (current) use of insulin: Secondary | ICD-10-CM | POA: Diagnosis not present

## 2016-07-10 DIAGNOSIS — E782 Mixed hyperlipidemia: Secondary | ICD-10-CM | POA: Diagnosis not present

## 2016-07-10 DIAGNOSIS — E1165 Type 2 diabetes mellitus with hyperglycemia: Secondary | ICD-10-CM | POA: Diagnosis not present

## 2016-07-10 DIAGNOSIS — E038 Other specified hypothyroidism: Secondary | ICD-10-CM | POA: Diagnosis not present

## 2016-07-10 DIAGNOSIS — E118 Type 2 diabetes mellitus with unspecified complications: Secondary | ICD-10-CM | POA: Diagnosis not present

## 2016-07-10 LAB — COMPREHENSIVE METABOLIC PANEL
ALT: 9 U/L (ref 6–29)
AST: 9 U/L — ABNORMAL LOW (ref 10–35)
Albumin: 3.4 g/dL — ABNORMAL LOW (ref 3.6–5.1)
Alkaline Phosphatase: 53 U/L (ref 33–130)
BUN: 14 mg/dL (ref 7–25)
CHLORIDE: 106 mmol/L (ref 98–110)
CO2: 24 mmol/L (ref 20–31)
CREATININE: 0.83 mg/dL (ref 0.50–0.99)
Calcium: 8.9 mg/dL (ref 8.6–10.4)
GLUCOSE: 118 mg/dL — AB (ref 65–99)
Potassium: 4.6 mmol/L (ref 3.5–5.3)
SODIUM: 139 mmol/L (ref 135–146)
Total Bilirubin: 0.2 mg/dL (ref 0.2–1.2)
Total Protein: 6.1 g/dL (ref 6.1–8.1)

## 2016-07-10 LAB — LIPID PANEL
Cholesterol: 161 mg/dL (ref <200)
HDL: 47 mg/dL — AB (ref >50)
LDL CALC: 83 mg/dL (ref <100)
TRIGLYCERIDES: 153 mg/dL — AB (ref <150)
Total CHOL/HDL Ratio: 3.4 Ratio (ref <5.0)
VLDL: 31 mg/dL — AB (ref <30)

## 2016-07-11 LAB — TSH: TSH: 2.13 m[IU]/L

## 2016-07-11 LAB — HEMOGLOBIN A1C
HEMOGLOBIN A1C: 6.5 % — AB (ref <5.7)
Mean Plasma Glucose: 140 mg/dL

## 2016-07-11 LAB — MICROALBUMIN / CREATININE URINE RATIO
CREATININE, URINE: 153 mg/dL (ref 20–320)
MICROALB UR: 1.5 mg/dL
MICROALB/CREAT RATIO: 10 ug/mg{creat} (ref <30)

## 2016-07-11 LAB — T4, FREE: Free T4: 1 ng/dL (ref 0.8–1.8)

## 2016-07-16 ENCOUNTER — Encounter: Payer: Self-pay | Admitting: "Endocrinology

## 2016-07-16 ENCOUNTER — Ambulatory Visit (INDEPENDENT_AMBULATORY_CARE_PROVIDER_SITE_OTHER): Payer: PPO | Admitting: "Endocrinology

## 2016-07-16 VITALS — BP 141/63 | HR 96 | Ht 64.0 in | Wt 254.0 lb

## 2016-07-16 DIAGNOSIS — E782 Mixed hyperlipidemia: Secondary | ICD-10-CM

## 2016-07-16 DIAGNOSIS — Z794 Long term (current) use of insulin: Secondary | ICD-10-CM | POA: Diagnosis not present

## 2016-07-16 DIAGNOSIS — E118 Type 2 diabetes mellitus with unspecified complications: Secondary | ICD-10-CM | POA: Diagnosis not present

## 2016-07-16 DIAGNOSIS — E038 Other specified hypothyroidism: Secondary | ICD-10-CM | POA: Diagnosis not present

## 2016-07-16 DIAGNOSIS — E1165 Type 2 diabetes mellitus with hyperglycemia: Secondary | ICD-10-CM | POA: Diagnosis not present

## 2016-07-16 DIAGNOSIS — IMO0002 Reserved for concepts with insufficient information to code with codable children: Secondary | ICD-10-CM

## 2016-07-16 NOTE — Patient Instructions (Signed)

## 2016-07-16 NOTE — Progress Notes (Signed)
Subjective:    Patient ID: Norma [name], female    DOB: [birth year],    Past Medical History:  Diagnosis Date  . Anemia   . Bipolar disorder ([place])   . Bulging lumbar disc    L3-4  . Chronic daily headache   . Chronic low back pain 09/20/2014  . COPD (chronic obstructive pulmonary disease) ([place])   . Degenerative arthritis   . Depression   . Diabetes mellitus   . Diabetes mellitus, type II ([place])   . DM type 2 with diabetic peripheral neuropathy ([place]) 09/20/2014  . Dyslipidemia   . Gastroparesis   . GERD (gastroesophageal reflux disease)   . Hypertension   . Hypothyroidism   . IBS (irritable bowel syndrome)   . Memory difficulty 09/20/2014  . Morbid obesity ([place])   . Neuropathy ([place])   . Obstructive sleep apnea on CPAP   . Restless legs syndrome (RLS) 09/17/2012   Past Surgical History:  Procedure Laterality Date  . ABDOMINAL HYSTERECTOMY    . ARTHROSCOPY KNEE W/ DRILLING  06/2011   and Decemer of 2013.  [name] Carpel tunnel  1980's  . CHOLECYSTECTOMY    . ESOPHAGOGASTRODUODENOSCOPY N/A 04/25/2016   Procedure: ESOPHAGOGASTRODUODENOSCOPY (EGD);  Surgeon: Rogene Houston, MD;  Location: [facility];  Service: Endoscopy;  Laterality: N/A;  730  . lesions     Removed from tongue  . RECTAL SURGERY     fissure  . SHOULDER SURGERY     arthroscopy in March of this year   Social History   Social History  . Marital status: Single    Spouse name: N/A  . Number of children: 0  . Years of education: 46   Occupational History  .  [employer]   Social History Main Topics  . Smoking status: Former Smoker    Types: Cigarettes    Quit date: 02/04/1971  . Smokeless tobacco: Never Used     Comment: smoked 2 cigarettes a day  . Alcohol use No  . Drug use: No  . Sexual activity: No   Other Topics Concern  . None   Social History Narrative   Patient lives at home alone.    Patient has no children.    Patient has her masters in nursing.    Patient is single.     Patient drinks about 2 glasses of tea daily.   Patient is right handed.   Outpatient Encounter Prescriptions as of 07/16/2016  Medication Sig  . albuterol (PROVENTIL HFA;VENTOLIN HFA) 108 (90 Base) MCG/ACT inhaler Inhale 2 puffs into the lungs every 6 (six) hours as needed for wheezing or shortness of breath.  . Aspirin-Acetaminophen-Caffeine (EXCEDRIN MIGRAINE PO) Take 2 tablets by mouth 3 (three) times daily as needed (pain). Take with tramadol  . buPROPion (WELLBUTRIN XL) 300 MG 24 hr tablet TAKE ONE TABLET BY MOUTH EVERY MORNING.  . butalbital-acetaminophen-caffeine (FIORICET WITH CODEINE) 50-325-40-30 MG capsule Take 1 capsule by mouth every 4 (four) hours as needed for headache.  . canagliflozin (INVOKANA) 100 MG TABS tablet Take 1 tablet (100 mg total) by mouth daily before breakfast.  . DEXILANT 60 MG capsule 60 mg daily.  . diclofenac sodium (VOLTAREN) 1 % GEL Apply 1 application topically as needed (pain).   [name] EPINEPHrine (EPIPEN IJ) Inject 1 Dose as directed as needed (allergic reaction).   . Insulin Glargine (TOUJEO SOLOSTAR) 300 UNIT/ML SOPN Inject 20 Units into the skin at bedtime.  . lamoTRIgine (LAMICTAL) 150 MG  tablet TAKE ONE TABLET BY MOUTH AT BEDTIME.  [name] levothyroxine (SYNTHROID, LEVOTHROID) 100 MCG tablet Take 100 mcg by mouth at bedtime.   [name] LITE TOUCH PEN NEEDLES 31G X 5 MM MISC USE ONE DAILY.  [name] losartan (COZAAR) 50 MG tablet Take 50 mg by mouth at bedtime.   . meclizine (ANTIVERT) 25 MG tablet Take 1 tablet (25 mg total) by mouth 3 (three) times daily as needed for dizziness. (Patient taking differently: Take 25 mg by mouth daily as needed for dizziness. )  . Melatonin 5 MG TABS Take 5 mg by mouth at bedtime as needed (sleep).  . metFORMIN (GLUCOPHAGE) 500 MG tablet Take 2 tablets (1,000 mg total) by mouth 2 (two) times daily with a meal.  . mupirocin ointment (BACTROBAN) 2 % APPLY AS DIRECTED TWICE DAILY AS NEEDED FOR SORES  . nystatin-triamcinolone ointment (MYCOLOG)  APPLY TWICE DAILY (Patient taking differently: Apply as needed for rash)  . oxymetazoline (AFRIN) 0.05 % nasal spray Place 1 spray into both nostrils as needed for congestion.  . pravastatin (PRAVACHOL) 20 MG tablet Take 20 mg by mouth every evening.   . pregabalin (LYRICA) 75 MG capsule Take 75 mg by mouth 3 (three) times daily.  . promethazine (PHENERGAN) 25 MG tablet Take 25 mg by mouth at bedtime as needed for nausea or vomiting.   . pseudoephedrine (SUDAFED) 120 MG 12 hr tablet Take 120 mg by mouth at bedtime as needed for congestion.  [name] rOPINIRole (REQUIP) 0.5 MG tablet 1 mg daily. Takes 0.5 mg with nap  . topiramate (TOPAMAX) 25 MG tablet 1 in the am and 2 in the pm  . traMADol (ULTRAM) 50 MG tablet Take 2 tablets (100 mg total) by mouth every 8 (eight) hours as needed for pain.  [name] venlafaxine XR (EFFEXOR-XR) 150 MG 24 hr capsule TAKE TWO (2) CAPSULES BY MOUTH AT BEDTIME.   No facility-administered encounter medications on file as of 07/16/2016.    ALLERGIES: Allergies  Allergen Reactions  . Amoxicillin Anaphylaxis  . Hydrocodone Anaphylaxis  . Primidone Anaphylaxis  . Depacon [Valproic Acid]     Causes falls   . Lisinopril     Dr took off as precaution   VACCINATION STATUS:  There is no immunization history on file for this patient.  Diabetes  She presents for her follow-up diabetic visit. She has type 2 diabetes mellitus. Onset time: She was diagnosed at approximate age of 12 years. Her disease course has been improving. There are no hypoglycemic associated symptoms. Pertinent negatives for hypoglycemia include no confusion, headaches, pallor or seizures. Associated symptoms include fatigue, polydipsia and polyuria. Pertinent negatives for diabetes include no chest pain and no polyphagia. There are no hypoglycemic complications. Symptoms are improving. There are no diabetic complications. Risk factors for coronary artery disease include diabetes mellitus, dyslipidemia,  hypertension, obesity and sedentary lifestyle. Current diabetic treatment includes oral agent (dual therapy). She is compliant with treatment most of the time. Her weight is increasing steadily. She is following a generally unhealthy diet. She has not had a previous visit with a dietitian. She never participates in exercise. Her breakfast blood glucose range is generally 140-180 mg/dl. Her dinner blood glucose range is generally 140-180 mg/dl. Her overall blood glucose range is 140-180 mg/dl. An ACE inhibitor/angiotensin II receptor blocker is being taken. Eye exam is current.  Hypertension  This is a chronic problem. The current episode started more than 1 year ago. Pertinent negatives include no chest pain, headaches, palpitations or shortness  of breath. Past treatments include ACE inhibitors. Compliance problems include diet, exercise, medication cost, medication side effects and psychosocial issues.  Identifiable causes of hypertension include a thyroid problem.  Hyperlipidemia  This is a chronic problem. The current episode started more than 1 year ago. Pertinent negatives include no chest pain, myalgias or shortness of breath. Current antihyperlipidemic treatment includes statins. Risk factors for coronary artery disease include a sedentary lifestyle, obesity, hypertension, diabetes mellitus and dyslipidemia.  Thyroid Problem  Presents for follow-up visit. Symptoms include fatigue. Patient reports no cold intolerance, diarrhea, heat intolerance or palpitations. The symptoms have been stable. Past treatments include levothyroxine. Her past medical history is significant for hyperlipidemia.     Review of Systems  Constitutional: Positive for fatigue. Negative for unexpected weight change.  HENT: Negative for trouble swallowing and voice change.   Eyes: Negative for visual disturbance.  Respiratory: Negative for cough, shortness of breath and wheezing.   Cardiovascular: Negative for chest pain,  palpitations and leg swelling.  Gastrointestinal: Negative for diarrhea, nausea and vomiting.  Endocrine: Positive for polydipsia and polyuria. Negative for cold intolerance, heat intolerance and polyphagia.  Musculoskeletal: Negative for arthralgias and myalgias.  Skin: Negative for color change, pallor, rash and wound.  Neurological: Negative for seizures and headaches.  Psychiatric/Behavioral: Negative for confusion and suicidal ideas.    Objective:    BP (!) 141/63   Pulse 96   Ht 5\' 4"  (1.626 m)   Wt 254 lb (115.2 kg)   BMI 43.60 kg/m   Wt Readings from Last 3 Encounters:  07/16/16 254 lb (115.2 kg)  06/21/16 248 lb 9.6 oz (112.8 kg)  06/10/16 251 lb 9.6 oz (114.1 kg)    Physical Exam  Constitutional: She is oriented to person, place, and time. She appears well-developed.  HENT:  Head: Normocephalic and atraumatic.  Eyes: EOM are normal.  Neck: Normal range of motion. Neck supple. No tracheal deviation present. No thyromegaly present.  Cardiovascular: Normal rate and regular rhythm.   Pulmonary/Chest: Effort normal and breath sounds normal.  Abdominal: Soft. Bowel sounds are normal. There is no tenderness. There is no guarding.  Musculoskeletal: Normal range of motion. She exhibits no edema.  Neurological: She is alert and oriented to person, place, and time. She has normal reflexes. No cranial nerve deficit. Coordination normal.  Skin: Skin is warm and dry. No rash noted. No erythema. No pallor.  Psychiatric: She has a normal mood and affect. Judgment normal.    Results for orders placed or performed in visit on 07/10/16  Microalbumin / creatinine urine ratio  Result Value Ref Range   Creatinine, Urine 153 20 - 320 mg/dL   Microalb, Ur 1.5 Not estab mg/dL   Microalb Creat Ratio 10 <30 mcg/mg creat  Comprehensive metabolic panel  Result Value Ref Range   Sodium 139 135 - 146 mmol/L   Potassium 4.6 3.5 - 5.3 mmol/L   Chloride 106 98 - 110 mmol/L   CO2 24 20 - 31  mmol/L   Glucose, Bld 118 (H) 65 - 99 mg/dL   BUN 14 7 - 25 mg/dL   Creat 0.83 0.50 - 0.99 mg/dL   Total Bilirubin 0.2 0.2 - 1.2 mg/dL   Alkaline Phosphatase 53 33 - 130 U/L   AST 9 (L) 10 - 35 U/L   ALT 9 6 - 29 U/L   Total Protein 6.1 6.1 - 8.1 g/dL   Albumin 3.4 (L) 3.6 - 5.1 g/dL   Calcium 8.9 8.6 - 10.4  mg/dL  Lipid panel  Result Value Ref Range   Cholesterol 161 <200 mg/dL   Triglycerides 153 (H) <150 mg/dL   HDL 47 (L) >50 mg/dL   Total CHOL/HDL Ratio 3.4 <5.0 Ratio   VLDL 31 (H) <30 mg/dL   LDL Cholesterol 83 <100 mg/dL  TSH  Result Value Ref Range   TSH 2.13 mIU/L  T4, free  Result Value Ref Range   Free T4 1.0 0.8 - 1.8 ng/dL  Hemoglobin A1c  Result Value Ref Range   Hgb A1c MFr Bld 6.5 (H) <5.7 %   Mean Plasma Glucose 140 mg/dL   Complete Blood Count (Most recent): No results found for: WBC, HGB, HCT, MCV, PLT Chemistry (most recent): Lab Results  Component Value Date   NA 139 07/10/2016   K 4.6 07/10/2016   CL 106 07/10/2016   CO2 24 07/10/2016   BUN 14 07/10/2016   CREATININE 0.83 07/10/2016   Diabetic Labs (most recent): Lab Results  Component Value Date   HGBA1C 6.5 (H) 07/10/2016   HGBA1C 6.6 (H) 03/27/2016   HGBA1C 9 12/25/2015    Assessment & Plan:   1. Uncontrolled type 2 diabetes mellitus with complication, without long-term current use of insulin (HCC)  Her diabetes is  complicated by neuropathy and patient remains at a high risk for more acute and chronic complications of diabetes which include CAD, CVA, CKD, retinopathy, and neuropathy. These are all discussed in detail with the patient.  Patient came with better control of her diabetes with A1c improving to 6.5%, Slowly improving from  9% .  - I have re-counseled the patient on diet management and weight loss  by adopting a carbohydrate restricted / protein rich  Diet.  - Suggestion is made for patient to avoid simple carbohydrates   from their diet including Cakes , Desserts, Ice  Cream,  Soda (  diet and regular) , Sweet Tea , Candies,  Chips, Cookies, Artificial Sweeteners,   and "Sugar-free" Products .  This will help patient to have stable blood glucose profile and potentially avoid unintended  Weight gain.  - Patient is advised to stick to a routine mealtimes to eat 3 meals  a day and avoid unnecessary snacks ( to snack only to correct hypoglycemia).  - The patient  will be  scheduled with Jearld Fenton, RDN, CDE for individualized DM education.  - I have approached patient with the following individualized plan to manage diabetes and patient agrees.  - Based on her  Recent glycemic burden, She will continue to  require basal insulin treatment for her diabetes.   -I will proceed  with basal insulin Toujeo 20 units daily at bedtime associated with monitoring of blood glucose before breakfast and at bedtime .  - I will continue on metformin 1000 mg by mouth twice a day and  Invokana  100mg  po qam.   - Patient will be considered for incretin therapy as appropriate next visit. - Patient specific target  for A1c; LDL, HDL, Triglycerides, and  Waist Circumference were discussed in detail.  2) BP/HTN: Controlled. Continue current medications including ACEI/ARB. 3) Lipids/HPL:  continue statins. 4)  Weight/Diet: CDE consult in progress, exercise, and carbohydrates information provided.  5) Hypothyroidism  - Her thyroid function tests are consistent with appropriate replacement.  I advised her to continue levothyroxine 100 g by mouth every morning.  - We discussed about correct intake of levothyroxine, at fasting, with water, separated by at least 30 minutes from breakfast, and  separated by more than 4 hours from calcium, iron, multivitamins, acid reflux medications (PPIs). -Patient is made aware of the fact that thyroid hormone replacement is needed for life, dose to be adjusted by periodic monitoring of thyroid function tests.  6) Chronic Care/Health  Maintenance:  -Patient is on ACEI/ARB and Statin medications and encouraged to continue to follow up with Ophthalmology, Podiatrist at least yearly or according to recommendations, and advised to  stay away from smoking. I have recommended yearly flu vaccine and pneumonia vaccination at least every 5 years; moderate intensity exercise for up to 150 minutes weekly; and  sleep for at least 7 hours a day.  I advised patient to maintain close follow up with their PCP for primary care needs.  Patient is asked to bring meter and  blood glucose logs during their next visit.   Follow up plan: Return in about 3 months (around 10/15/2016) for follow up with pre-visit labs, meter, and logs.  Glade Lloyd, MD Phone: [phone]  Fax: [phone]   07/16/2016, 3:09 PM

## 2016-07-29 DIAGNOSIS — E119 Type 2 diabetes mellitus without complications: Secondary | ICD-10-CM | POA: Diagnosis not present

## 2016-07-29 DIAGNOSIS — R079 Chest pain, unspecified: Secondary | ICD-10-CM | POA: Diagnosis not present

## 2016-07-29 DIAGNOSIS — G2581 Restless legs syndrome: Secondary | ICD-10-CM | POA: Diagnosis not present

## 2016-07-29 DIAGNOSIS — J449 Chronic obstructive pulmonary disease, unspecified: Secondary | ICD-10-CM | POA: Diagnosis not present

## 2016-08-01 DIAGNOSIS — M549 Dorsalgia, unspecified: Secondary | ICD-10-CM | POA: Diagnosis not present

## 2016-08-01 DIAGNOSIS — B351 Tinea unguium: Secondary | ICD-10-CM | POA: Diagnosis not present

## 2016-08-01 DIAGNOSIS — Z6841 Body Mass Index (BMI) 40.0 and over, adult: Secondary | ICD-10-CM | POA: Diagnosis not present

## 2016-08-01 DIAGNOSIS — E1142 Type 2 diabetes mellitus with diabetic polyneuropathy: Secondary | ICD-10-CM | POA: Diagnosis not present

## 2016-08-01 DIAGNOSIS — M79676 Pain in unspecified toe(s): Secondary | ICD-10-CM | POA: Diagnosis not present

## 2016-08-01 DIAGNOSIS — L84 Corns and callosities: Secondary | ICD-10-CM | POA: Diagnosis not present

## 2016-08-01 DIAGNOSIS — M546 Pain in thoracic spine: Secondary | ICD-10-CM | POA: Diagnosis not present

## 2016-08-01 DIAGNOSIS — M47814 Spondylosis without myelopathy or radiculopathy, thoracic region: Secondary | ICD-10-CM | POA: Diagnosis not present

## 2016-08-09 ENCOUNTER — Ambulatory Visit (INDEPENDENT_AMBULATORY_CARE_PROVIDER_SITE_OTHER): Payer: PPO | Admitting: Cardiology

## 2016-08-09 ENCOUNTER — Encounter: Payer: Self-pay | Admitting: Cardiology

## 2016-08-09 VITALS — BP 143/78 | HR 98 | Ht 64.0 in | Wt 255.0 lb

## 2016-08-09 DIAGNOSIS — R109 Unspecified abdominal pain: Secondary | ICD-10-CM

## 2016-08-09 DIAGNOSIS — R0789 Other chest pain: Secondary | ICD-10-CM | POA: Diagnosis not present

## 2016-08-09 DIAGNOSIS — I447 Left bundle-branch block, unspecified: Secondary | ICD-10-CM

## 2016-08-09 NOTE — Patient Instructions (Signed)
Medication Instructions:  Your physician recommends that you continue on your current medications as directed. Please refer to the Current Medication list given to you today.  Labwork: NONE  Testing/Procedures: NONE  Follow-Up: Your physician recommends that you schedule a follow-up appointment in: 3 MONTHS WITH DR. BRANCH   You have been referred to Dr. Laural Golden (Gastroenterology)   Any Other Special Instructions Will Be Listed Below (If Applicable).  If you need a refill on your cardiac medications before your next appointment, please call your pharmacy.

## 2016-08-09 NOTE — Progress Notes (Signed)
Clinical Summary Norma [name] is a 66 y.o.female seen today for follow up of the following medical problems.   1. Chest pain - nuclear stress 2007 without clear ischemia.  - symptoms started in early August. Cramping pain epigastric area that spreads throughout chest into both arms and left side of neck. 10/10 in severity. Occurs at rest or with exertion. Can have some SOB. Can be worst with laying down. No clear association with eating. - can have some DOE at times, though limited due to chronic back and leg pains. Fairly sedentary lifestyle. - No LE edema. - pain occurs daily, can be up to two times daily  - normal stress test 03/2016 - EGD Jan 2018: erythemaous mucosa  - chest pain initially better with lyrica. Since that time pain has returned - sharp pain midchest, radiates to back. 10/10 in severe. Often occurs at rest. Can have some belching. No SOB. Pain typically lasts just a few minutes. Can last up to 3.5 hours. Better with sitting up. No relation to food.   2. Chronic LBBB Past Medical History:  Diagnosis Date  . Anemia   . Bipolar disorder ([place])   . Bulging lumbar disc    L3-4  . Chronic daily headache   . Chronic low back pain 09/20/2014  . COPD (chronic obstructive pulmonary disease) ([place])   . Degenerative arthritis   . Depression   . Diabetes mellitus   . Diabetes mellitus, type II ([place])   . DM type 2 with diabetic peripheral neuropathy ([place]) 09/20/2014  . Dyslipidemia   . Gastroparesis   . GERD (gastroesophageal reflux disease)   . Hypertension   . Hypothyroidism   . IBS (irritable bowel syndrome)   . Memory difficulty 09/20/2014  . Morbid obesity ([place])   . Neuropathy   . Obstructive sleep apnea on CPAP   . Restless legs syndrome (RLS) 09/17/2012     Allergies  Allergen Reactions  . Amoxicillin Anaphylaxis  . Hydrocodone Anaphylaxis  . Primidone Anaphylaxis  . Depacon [Valproic Acid]     Causes falls   . Lisinopril     Dr took off as precaution       Current Outpatient Prescriptions  Medication Sig Dispense Refill  . albuterol (PROVENTIL HFA;VENTOLIN HFA) 108 (90 Base) MCG/ACT inhaler Inhale 2 puffs into the lungs every 6 (six) hours as needed for wheezing or shortness of breath.    . Aspirin-Acetaminophen-Caffeine (EXCEDRIN MIGRAINE PO) Take 2 tablets by mouth 3 (three) times daily as needed (pain). Take with tramadol    . buPROPion (WELLBUTRIN XL) 300 MG 24 hr tablet TAKE ONE TABLET BY MOUTH EVERY MORNING. 30 tablet 2  . butalbital-acetaminophen-caffeine (FIORICET WITH CODEINE) 50-325-40-30 MG capsule Take 1 capsule by mouth every 4 (four) hours as needed for headache.    . canagliflozin (INVOKANA) 100 MG TABS tablet Take 1 tablet (100 mg total) by mouth daily before breakfast. 30 tablet 3  . DEXILANT 60 MG capsule 60 mg daily.    . diclofenac sodium (VOLTAREN) 1 % GEL Apply 1 application topically as needed (pain).     [name] EPINEPHrine (EPIPEN IJ) Inject 1 Dose as directed as needed (allergic reaction).     . Insulin Glargine (TOUJEO SOLOSTAR) 300 UNIT/ML SOPN Inject 20 Units into the skin at bedtime.    . lamoTRIgine (LAMICTAL) 150 MG tablet TAKE ONE TABLET BY MOUTH AT BEDTIME. 30 tablet 2  . levothyroxine (SYNTHROID, LEVOTHROID) 100 MCG tablet Take 100 mcg by mouth at  bedtime.     [name] LITE TOUCH PEN NEEDLES 31G X 5 MM MISC USE ONE DAILY. 100 each 5  . losartan (COZAAR) 50 MG tablet Take 50 mg by mouth at bedtime.     . meclizine (ANTIVERT) 25 MG tablet Take 1 tablet (25 mg total) by mouth 3 (three) times daily as needed for dizziness. (Patient taking differently: Take 25 mg by mouth daily as needed for dizziness. ) 30 tablet 0  . Melatonin 5 MG TABS Take 5 mg by mouth at bedtime as needed (sleep).    . metFORMIN (GLUCOPHAGE) 500 MG tablet Take 2 tablets (1,000 mg total) by mouth 2 (two) times daily with a meal. 60 tablet 3  . mupirocin ointment (BACTROBAN) 2 % APPLY AS DIRECTED TWICE DAILY AS NEEDED FOR SORES  5  .  nystatin-triamcinolone ointment (MYCOLOG) APPLY TWICE DAILY (Patient taking differently: Apply as needed for rash) 60 g 0  . oxymetazoline (AFRIN) 0.05 % nasal spray Place 1 spray into both nostrils as needed for congestion.    . pravastatin (PRAVACHOL) 20 MG tablet Take 20 mg by mouth every evening.   99  . pregabalin (LYRICA) 75 MG capsule Take 75 mg by mouth 3 (three) times daily.    . promethazine (PHENERGAN) 25 MG tablet Take 25 mg by mouth at bedtime as needed for nausea or vomiting.     . pseudoephedrine (SUDAFED) 120 MG 12 hr tablet Take 120 mg by mouth at bedtime as needed for congestion.    [name] rOPINIRole (REQUIP) 0.5 MG tablet 1 mg daily. Takes 0.5 mg with nap    . topiramate (TOPAMAX) 25 MG tablet 1 in the am and 2 in the pm 90 tablet 6  . traMADol (ULTRAM) 50 MG tablet Take 2 tablets (100 mg total) by mouth every 8 (eight) hours as needed for pain. 180 tablet 5  . venlafaxine XR (EFFEXOR-XR) 150 MG 24 hr capsule TAKE TWO (2) CAPSULES BY MOUTH AT BEDTIME. 60 capsule 2   No current facility-administered medications for this visit.      Past Surgical History:  Procedure Laterality Date  . ABDOMINAL HYSTERECTOMY    . ARTHROSCOPY KNEE W/ DRILLING  06/2011   and Decemer of 2013.  [name] Carpel tunnel  1980's  . CHOLECYSTECTOMY    . ESOPHAGOGASTRODUODENOSCOPY N/A 04/25/2016   Procedure: ESOPHAGOGASTRODUODENOSCOPY (EGD);  Surgeon: Rogene Houston, MD;  Location: [facility];  Service: Endoscopy;  Laterality: N/A;  730  . lesions     Removed from tongue  . RECTAL SURGERY     fissure  . SHOULDER SURGERY     arthroscopy in March of this year     Allergies  Allergen Reactions  . Amoxicillin Anaphylaxis  . Hydrocodone Anaphylaxis  . Primidone Anaphylaxis  . Depacon [Valproic Acid]     Causes falls   . Lisinopril     Dr took off as precaution      Family History  Problem Relation Age of Onset  . Hypertension Mother   . Lymphoma Mother   . Depression Mother   . Arthritis  Father   . Alcohol abuse Father   . Hypertension Sister   . Cancer Brother     kidney and lung  . Alcohol abuse Brother   . Alcohol abuse Paternal Uncle   . Alcohol abuse Paternal Grandfather   . Alcohol abuse Paternal Grandmother      Social History Norma [name] reports that she quit smoking about 45 years ago.  Her smoking use included Cigarettes. She has never used smokeless tobacco. Norma [name] reports that she does not drink alcohol.   Review of Systems CONSTITUTIONAL: No weight loss, fever, chills, weakness or fatigue.  HEENT: Eyes: No visual loss, blurred vision, double vision or yellow sclerae.No hearing loss, sneezing, congestion, runny nose or sore throat.  SKIN: No rash or itching.  CARDIOVASCULAR: per hpi RESPIRATORY: No shortness of breath, cough or sputum.  GASTROINTESTINAL: No anorexia, nausea, vomiting or diarrhea. No abdominal pain or blood.  GENITOURINARY: No burning on urination, no polyuria NEUROLOGICAL: No headache, dizziness, syncope, paralysis, ataxia, numbness or tingling in the extremities. No change in bowel or bladder control.  MUSCULOSKELETAL: No muscle, back pain, joint pain or stiffness.  LYMPHATICS: No enlarged nodes. No history of splenectomy.  PSYCHIATRIC: No history of depression or anxiety.  ENDOCRINOLOGIC: No reports of sweating, cold or heat intolerance. No polyuria or polydipsia.  [name]   Physical Examination Vitals:   08/09/16 1316  BP: (!) 143/78  Pulse: 98   Vitals:   08/09/16 1316  Weight: 255 lb (115.7 kg)  Height: 5\' 4"  (1.626 m)    Gen: resting comfortably, no acute distress HEENT: no scleral icterus, pupils equal round and reactive, no palptable cervical adenopathy,  CV: RRR, no m/r/g, no jvd Resp: Clear to auscultation bilaterally GI: abdomen is soft, non-tender, non-distended, normal bowel sounds, no hepatosplenomegaly MSK: extremities are warm, no edema.  Skin: warm, no rash Neuro:  no focal deficits Psych: appropriate  affect   Diagnostic Studies 03/2016 Lexiscan  Probable normal perfusion and mild soft tissue attenuation (breast) No significant ischemia or evidence for scar  The left ventricular ejection fraction is normal (55-65%).  Low risk scan.    Assessment and Plan  1. Atypical hest pain - negative stress test - continue to monitor at this time. She has asked for a referral to GI.    2. LBBB - negative stress test recently, no evidence of ischemic etiology at this time.       Arnoldo Lenis, M.D.

## 2016-08-16 ENCOUNTER — Ambulatory Visit (INDEPENDENT_AMBULATORY_CARE_PROVIDER_SITE_OTHER): Payer: Self-pay | Admitting: Internal Medicine

## 2016-08-20 ENCOUNTER — Other Ambulatory Visit (HOSPITAL_COMMUNITY): Payer: Self-pay | Admitting: Rehabilitation

## 2016-08-20 ENCOUNTER — Telehealth (INDEPENDENT_AMBULATORY_CARE_PROVIDER_SITE_OTHER): Payer: Self-pay | Admitting: Internal Medicine

## 2016-08-20 DIAGNOSIS — M546 Pain in thoracic spine: Secondary | ICD-10-CM

## 2016-08-20 NOTE — Telephone Encounter (Signed)
Patient called, she stated that she has been having a lot of chest pain.  Stated that Dr. Laural Golden has done several test and made several referrals.  Dr. Harl Bowie, Dr. Maia Petties, and Dr. Luan Pulling all agree that it must be reflux.  She started taking Prilosec 40mg , twice a day and Lyrica 100mg  3 times a day.  For the most part it seems to control the pain.  The Dexilant does not work.  She would like to know if Dr. Laural Golden has any recommendations.  [phone] - she has a busy day today, will be hard to catch, but please leave a message

## 2016-08-27 ENCOUNTER — Ambulatory Visit (HOSPITAL_COMMUNITY): Payer: PPO

## 2016-08-27 ENCOUNTER — Encounter (HOSPITAL_COMMUNITY): Payer: Self-pay

## 2016-08-27 NOTE — Telephone Encounter (Signed)
Referral and notes have been faxed, [facility] will contact patient with appt

## 2016-08-27 NOTE — Telephone Encounter (Signed)
Dr.Rehman has no further recommendations at this time,other than refer her to [facility] for Manometry. Hs still feels that this is not Reflux causing her symptoms/chest pain.  Ann - I am calling the patient  To let her know. She may or may not want to precede with this.

## 2016-09-02 DIAGNOSIS — G4733 Obstructive sleep apnea (adult) (pediatric): Secondary | ICD-10-CM | POA: Diagnosis not present

## 2016-09-03 ENCOUNTER — Ambulatory Visit (HOSPITAL_COMMUNITY): Payer: PPO

## 2016-09-05 DIAGNOSIS — R5383 Other fatigue: Secondary | ICD-10-CM | POA: Diagnosis not present

## 2016-09-05 DIAGNOSIS — E785 Hyperlipidemia, unspecified: Secondary | ICD-10-CM | POA: Diagnosis not present

## 2016-09-05 DIAGNOSIS — M1990 Unspecified osteoarthritis, unspecified site: Secondary | ICD-10-CM | POA: Diagnosis not present

## 2016-09-05 DIAGNOSIS — G4730 Sleep apnea, unspecified: Secondary | ICD-10-CM | POA: Diagnosis not present

## 2016-09-05 DIAGNOSIS — G43909 Migraine, unspecified, not intractable, without status migrainosus: Secondary | ICD-10-CM | POA: Diagnosis not present

## 2016-09-05 DIAGNOSIS — R5381 Other malaise: Secondary | ICD-10-CM | POA: Diagnosis not present

## 2016-09-05 DIAGNOSIS — J449 Chronic obstructive pulmonary disease, unspecified: Secondary | ICD-10-CM | POA: Diagnosis not present

## 2016-09-05 DIAGNOSIS — K219 Gastro-esophageal reflux disease without esophagitis: Secondary | ICD-10-CM | POA: Diagnosis not present

## 2016-09-05 DIAGNOSIS — R079 Chest pain, unspecified: Secondary | ICD-10-CM | POA: Diagnosis not present

## 2016-09-05 DIAGNOSIS — E039 Hypothyroidism, unspecified: Secondary | ICD-10-CM | POA: Diagnosis not present

## 2016-09-05 DIAGNOSIS — G2581 Restless legs syndrome: Secondary | ICD-10-CM | POA: Diagnosis not present

## 2016-09-05 DIAGNOSIS — E1165 Type 2 diabetes mellitus with hyperglycemia: Secondary | ICD-10-CM | POA: Diagnosis not present

## 2016-09-05 DIAGNOSIS — F319 Bipolar disorder, unspecified: Secondary | ICD-10-CM | POA: Diagnosis not present

## 2016-09-10 ENCOUNTER — Ambulatory Visit (HOSPITAL_COMMUNITY)
Admission: RE | Admit: 2016-09-10 | Discharge: 2016-09-10 | Disposition: A | Discharge status: Home / Self Care | Payer: PPO | Source: Ambulatory Visit | Attending: Rehabilitation | Admitting: Rehabilitation

## 2016-09-10 DIAGNOSIS — M5124 Other intervertebral disc displacement, thoracic region: Secondary | ICD-10-CM | POA: Insufficient documentation

## 2016-09-10 DIAGNOSIS — M546 Pain in thoracic spine: Secondary | ICD-10-CM

## 2016-09-17 DIAGNOSIS — K219 Gastro-esophageal reflux disease without esophagitis: Secondary | ICD-10-CM | POA: Diagnosis not present

## 2016-09-17 DIAGNOSIS — K222 Esophageal obstruction: Secondary | ICD-10-CM | POA: Diagnosis not present

## 2016-09-17 DIAGNOSIS — R079 Chest pain, unspecified: Secondary | ICD-10-CM | POA: Diagnosis not present

## 2016-09-18 DIAGNOSIS — M546 Pain in thoracic spine: Secondary | ICD-10-CM | POA: Diagnosis not present

## 2016-09-18 DIAGNOSIS — M47814 Spondylosis without myelopathy or radiculopathy, thoracic region: Secondary | ICD-10-CM | POA: Diagnosis not present

## 2016-09-19 ENCOUNTER — Telehealth (INDEPENDENT_AMBULATORY_CARE_PROVIDER_SITE_OTHER): Payer: Self-pay | Admitting: *Deleted

## 2016-09-19 ENCOUNTER — Other Ambulatory Visit (INDEPENDENT_AMBULATORY_CARE_PROVIDER_SITE_OTHER): Payer: Self-pay | Admitting: *Deleted

## 2016-09-19 ENCOUNTER — Encounter (INDEPENDENT_AMBULATORY_CARE_PROVIDER_SITE_OTHER): Payer: Self-pay | Admitting: *Deleted

## 2016-09-19 DIAGNOSIS — D649 Anemia, unspecified: Secondary | ICD-10-CM | POA: Insufficient documentation

## 2016-09-19 MED ORDER — PEG 3350-KCL-NA BICARB-NACL 420 G PO SOLR: 4000.0000 mL | Freq: Once | ORAL | 0 refills | Status: AC

## 2016-09-19 NOTE — Telephone Encounter (Signed)
Patient needs trilyte 

## 2016-09-19 NOTE — Telephone Encounter (Signed)
TCS w propofol sch'd 10/04/16, left detailed message for patient instructions mailed

## 2016-09-19 NOTE — Telephone Encounter (Signed)
Patient was called and a message left that Dr.Rehman had agreed to precede with a TCS with Propofol. She was advised that Lacretia Nicks , Referral Coordinator would be calling her with the date and the time.

## 2016-09-19 NOTE — Telephone Encounter (Signed)
Patient called to let Dr.Rehman know that her HGB is 8.7 , she thinks that her HCT was 27. This was completed at PCP, Dr.Hawkins. She has had EGD. Patient states that it is going to be while before she can be seen by Dr.Rehman in office. She is asking of she can precede with Colonoscopy.  Discussed with Dr.Rehman. He agrees that we should precede with Colonoscopy with Propofol.  Patient has not been made aware of this, forwarded to [facility].

## 2016-09-26 NOTE — Patient Instructions (Signed)
Norma [name]  09/26/2016     @PREFPERIOPPHARMACY @   Your procedure is scheduled on  10/04/2016   Report to [facility] at  7  A.M.  Call this number if you have problems the morning of surgery:  [phone]   Remember:  Do not eat food or drink liquids after midnight.  Take these medicines the morning of surgery with A SIP OF WATER  Wellbutrin, fioricet, dexilant, levothyroxine, cozaar, lyrica, antivert, topamax, ultram. Take your inhaler before you come. Take 1/2 of your usual insulin dosage the night before your procedure. DO NOT take any medications for diabetes the morning of your procedure.   Do not wear jewelry, make-up or nail polish.  Do not wear lotions, powders, or perfumes, or deoderant.  Do not shave 48 hours prior to surgery.  Men may shave face and neck.  Do not bring valuables to the hospital.  [facility] is not responsible for any belongings or valuables.  Contacts, dentures or bridgework may not be worn into surgery.  Leave your suitcase in the car.  After surgery it may be brought to your room.  For patients admitted to the hospital, discharge time will be determined by your treatment team.  Patients discharged the day of surgery will not be allowed to drive home.   Name and phone number of your driver:   family Special instructions:  Follow the diet and prep instructions given to you by Dr Olevia Perches office.  Please read over the following fact sheets that you were given. Anesthesia Post-op Instructions and Care and Recovery After Surgery       Colonoscopy, Adult A colonoscopy is an exam to look at the entire large intestine. During the exam, a lubricated, bendable tube is inserted into the anus and then passed into the rectum, colon, and other parts of the large intestine. A colonoscopy is often done as a part of normal colorectal screening or in response to certain symptoms, such as anemia, persistent diarrhea, abdominal pain, and blood  in the stool. The exam can help screen for and diagnose medical problems, including:  Tumors.  Polyps.  Inflammation.  Areas of bleeding.  Tell a health care provider about:  Any allergies you have.  All medicines you are taking, including vitamins, herbs, eye drops, creams, and over-the-counter medicines.  Any problems you or family members have had with anesthetic medicines.  Any blood disorders you have.  Any surgeries you have had.  Any medical conditions you have.  Any problems you have had passing stool. What are the risks? Generally, this is a safe procedure. However, problems may occur, including:  Bleeding.  A tear in the intestine.  A reaction to medicines given during the exam.  Infection (rare).  What happens before the procedure? Eating and drinking restrictions Follow instructions from your health care provider about eating and drinking, which may include:  A few days before the procedure - follow a low-fiber diet. Avoid nuts, seeds, dried fruit, raw fruits, and vegetables.  1-3 days before the procedure - follow a clear liquid diet. Drink only clear liquids, such as clear broth or bouillon, black coffee or tea, clear juice, clear soft drinks or sports drinks, gelatin dessert, and popsicles. Avoid any liquids that contain red or purple dye.  On the day of the procedure - do not eat or drink anything during the 2 hours before the procedure, or within the time period that your  health care provider recommends.  Bowel prep If you were prescribed an oral bowel prep to clean out your colon:  Take it as told by your health care provider. Starting the day before your procedure, you will need to drink a large amount of medicated liquid. The liquid will cause you to have multiple loose stools until your stool is almost clear or light green.  If your skin or anus gets irritated from diarrhea, you may use these to relieve the irritation: ? Medicated wipes, such  as adult wet wipes with aloe and vitamin E. ? A skin soothing-product like petroleum jelly.  If you vomit while drinking the bowel prep, take a break for up to 60 minutes and then begin the bowel prep again. If vomiting continues and you cannot take the bowel prep without vomiting, call your health care provider.  General instructions  Ask your health care provider about changing or stopping your regular medicines. This is especially important if you are taking diabetes medicines or blood thinners.  Plan to have someone take you home from the hospital or clinic. What happens during the procedure?  An IV tube may be inserted into one of your veins.  You will be given medicine to help you relax (sedative).  To reduce your risk of infection: ? Your health care team will wash or sanitize their hands. ? Your anal area will be washed with soap.  You will be asked to lie on your side with your knees bent.  Your health care provider will lubricate a long, thin, flexible tube. The tube will have a camera and a light on the end.  The tube will be inserted into your anus.  The tube will be gently eased through your rectum and colon.  Air will be delivered into your colon to keep it open. You may feel some pressure or cramping.  The camera will be used to take images during the procedure.  A small tissue sample may be removed from your body to be examined under a microscope (biopsy). If any potential problems are found, the tissue will be sent to a lab for testing.  If small polyps are found, your health care provider may remove them and have them checked for cancer cells.  The tube that was inserted into your anus will be slowly removed. The procedure may vary among health care providers and hospitals. What happens after the procedure?  Your blood pressure, heart rate, breathing rate, and blood oxygen level will be monitored until the medicines you were given have worn off.  Do not  drive for 24 hours after the exam.  You may have a small amount of blood in your stool.  You may pass gas and have mild abdominal cramping or bloating due to the air that was used to inflate your colon during the exam.  It is up to you to get the results of your procedure. Ask your health care provider, or the department performing the procedure, when your results will be ready. This information is not intended to replace advice given to you by your health care provider. Make sure you discuss any questions you have with your health care provider. Document Released: 03/29/2000 Document Revised: 01/31/2016 Document Reviewed: 06/13/2015 Elsevier Interactive Patient Education  2018 [employer].  Colonoscopy, Adult, Care After This sheet gives you information about how to care for yourself after your procedure. Your health care provider may also give you more specific instructions. If you have problems or questions,  contact your health care provider. What can I expect after the procedure? After the procedure, it is common to have:  A small amount of blood in your stool for 24 hours after the procedure.  Some gas.  Mild abdominal cramping or bloating.  Follow these instructions at home: General instructions   For the first 24 hours after the procedure: ? Do not drive or use machinery. ? Do not sign important documents. ? Do not drink alcohol. ? Do your regular daily activities at a slower pace than normal. ? Eat soft, easy-to-digest foods. ? Rest often.  Take over-the-counter or prescription medicines only as told by your health care provider.  It is up to you to get the results of your procedure. Ask your health care provider, or the department performing the procedure, when your results will be ready. Relieving cramping and bloating  Try walking around when you have cramps or feel bloated.  Apply heat to your abdomen as told by your health care provider. Use a heat source that  your health care provider recommends, such as a moist heat pack or a heating pad. ? Place a towel between your skin and the heat source. ? Leave the heat on for 20-30 minutes. ? Remove the heat if your skin turns bright red. This is especially important if you are unable to feel pain, heat, or cold. You may have a greater risk of getting burned. Eating and drinking  Drink enough fluid to keep your urine clear or pale yellow.  Resume your normal diet as instructed by your health care provider. Avoid heavy or fried foods that are hard to digest.  Avoid drinking alcohol for as long as instructed by your health care provider. Contact a health care provider if:  You have blood in your stool 2-3 days after the procedure. Get help right away if:  You have more than a small spotting of blood in your stool.  You pass large blood clots in your stool.  Your abdomen is swollen.  You have nausea or vomiting.  You have a fever.  You have increasing abdominal pain that is not relieved with medicine. This information is not intended to replace advice given to you by your health care provider. Make sure you discuss any questions you have with your health care provider. Document Released: 11/14/2003 Document Revised: 12/25/2015 Document Reviewed: 06/13/2015 Elsevier Interactive Patient Education  2018 [place] Anesthesia is a term that refers to techniques, procedures, and medicines that help a person stay safe and comfortable during a medical procedure. Monitored anesthesia care, or sedation, is one type of anesthesia. Your anesthesia specialist may recommend sedation if you will be having a procedure that does not require you to be unconscious, such as:  Cataract surgery.  A dental procedure.  A biopsy.  A colonoscopy.  During the procedure, you may receive a medicine to help you relax (sedative). There are three levels of sedation:  Mild sedation. At this  level, you may feel awake and relaxed. You will be able to follow directions.  Moderate sedation. At this level, you will be sleepy. You may not remember the procedure.  Deep sedation. At this level, you will be asleep. You will not remember the procedure.  The more medicine you are given, the deeper your level of sedation will be. Depending on how you respond to the procedure, the anesthesia specialist may change your level of sedation or the type of anesthesia to fit your  needs. An anesthesia specialist will monitor you closely during the procedure. Let your health care provider know about:  Any allergies you have.  All medicines you are taking, including vitamins, herbs, eye drops, creams, and over-the-counter medicines.  Any use of steroids (by mouth or as a cream).  Any problems you or family members have had with sedatives and anesthetic medicines.  Any blood disorders you have.  Any surgeries you have had.  Any medical conditions you have, such as sleep apnea.  Whether you are pregnant or may be pregnant.  Any use of cigarettes, alcohol, or street drugs. What are the risks? Generally, this is a safe procedure. However, problems may occur, including:  Getting too much medicine (oversedation).  Nausea.  Allergic reaction to medicines.  Trouble breathing. If this happens, a breathing tube may be used to help with breathing. It will be removed when you are awake and breathing on your own.  Heart trouble.  Lung trouble.  Before the procedure Staying hydrated Follow instructions from your health care provider about hydration, which may include:  Up to 2 hours before the procedure - you may continue to drink clear liquids, such as water, clear fruit juice, black coffee, and plain tea.  Eating and drinking restrictions Follow instructions from your health care provider about eating and drinking, which may include:  8 hours before the procedure - stop eating heavy  meals or foods such as meat, fried foods, or fatty foods.  6 hours before the procedure - stop eating light meals or foods, such as toast or cereal.  6 hours before the procedure - stop drinking milk or drinks that contain milk.  2 hours before the procedure - stop drinking clear liquids.  Medicines Ask your health care provider about:  Changing or stopping your regular medicines. This is especially important if you are taking diabetes medicines or blood thinners.  Taking medicines such as aspirin and ibuprofen. These medicines can thin your blood. Do not take these medicines before your procedure if your health care provider instructs you not to.  Tests and exams  You will have a physical exam.  You may have blood tests done to show: ? How well your kidneys and liver are working. ? How well your blood can clot.  General instructions  Plan to have someone take you home from the hospital or clinic.  If you will be going home right after the procedure, plan to have someone with you for 24 hours.  What happens during the procedure?  Your blood pressure, heart rate, breathing, level of pain and overall condition will be monitored.  An IV tube will be inserted into one of your veins.  Your anesthesia specialist will give you medicines as needed to keep you comfortable during the procedure. This may mean changing the level of sedation.  The procedure will be performed. After the procedure  Your blood pressure, heart rate, breathing rate, and blood oxygen level will be monitored until the medicines you were given have worn off.  Do not drive for 24 hours if you received a sedative.  You may: ? Feel sleepy, clumsy, or nauseous. ? Feel forgetful about what happened after the procedure. ? Have a sore throat if you had a breathing tube during the procedure. ? Vomit. This information is not intended to replace advice given to you by your health care provider. Make sure you  discuss any questions you have with your health care provider. Document Released: 12/26/2004 Document Revised:  09/08/2015 Document Reviewed: 07/23/2015 Elsevier Interactive Patient Education  2018 [place], Care After These instructions provide you with information about caring for yourself after your procedure. Your health care provider may also give you more specific instructions. Your treatment has been planned according to current medical practices, but problems sometimes occur. Call your health care provider if you have any problems or questions after your procedure. What can I expect after the procedure? After your procedure, it is common to:  Feel sleepy for several hours.  Feel clumsy and have poor balance for several hours.  Feel forgetful about what happened after the procedure.  Have poor judgment for several hours.  Feel nauseous or vomit.  Have a sore throat if you had a breathing tube during the procedure.  Follow these instructions at home: For at least 24 hours after the procedure:   Do not: ? Participate in activities in which you could fall or become injured. ? Drive. ? Use heavy machinery. ? Drink alcohol. ? Take sleeping pills or medicines that cause drowsiness. ? Make important decisions or sign legal documents. ? Take care of children on your own.  Rest. Eating and drinking  Follow the diet that is recommended by your health care provider.  If you vomit, drink water, juice, or soup when you can drink without vomiting.  Make sure you have little or no nausea before eating solid foods. General instructions  Have a responsible adult stay with you until you are awake and alert.  Take over-the-counter and prescription medicines only as told by your health care provider.  If you smoke, do not smoke without supervision.  Keep all follow-up visits as told by your health care provider. This is important. Contact a health  care provider if:  You keep feeling nauseous or you keep vomiting.  You feel light-headed.  You develop a rash.  You have a fever. Get help right away if:  You have trouble breathing. This information is not intended to replace advice given to you by your health care provider. Make sure you discuss any questions you have with your health care provider. Document Released: 07/23/2015 Document Revised: 11/22/2015 Document Reviewed: 07/23/2015 Elsevier Interactive Patient Education  [employer].

## 2016-10-01 ENCOUNTER — Encounter (HOSPITAL_COMMUNITY)
Admission: RE | Admit: 2016-10-01 | Discharge: 2016-10-01 | Disposition: A | Discharge status: Home / Self Care | Payer: PPO | Source: Ambulatory Visit | Attending: Internal Medicine | Admitting: Internal Medicine

## 2016-10-02 ENCOUNTER — Encounter (HOSPITAL_COMMUNITY)
Admission: RE | Admit: 2016-10-02 | Discharge: 2016-10-02 | Disposition: A | Discharge status: Home / Self Care | Payer: PPO | Source: Ambulatory Visit | Attending: Internal Medicine | Admitting: Internal Medicine

## 2016-10-02 ENCOUNTER — Encounter (INDEPENDENT_AMBULATORY_CARE_PROVIDER_SITE_OTHER): Payer: Self-pay | Admitting: *Deleted

## 2016-10-02 ENCOUNTER — Encounter (HOSPITAL_COMMUNITY): Payer: Self-pay

## 2016-10-02 DIAGNOSIS — D509 Iron deficiency anemia, unspecified: Secondary | ICD-10-CM | POA: Diagnosis not present

## 2016-10-02 DIAGNOSIS — G4733 Obstructive sleep apnea (adult) (pediatric): Secondary | ICD-10-CM | POA: Diagnosis not present

## 2016-10-02 DIAGNOSIS — Z794 Long term (current) use of insulin: Secondary | ICD-10-CM | POA: Diagnosis not present

## 2016-10-02 DIAGNOSIS — E039 Hypothyroidism, unspecified: Secondary | ICD-10-CM | POA: Diagnosis not present

## 2016-10-02 DIAGNOSIS — J449 Chronic obstructive pulmonary disease, unspecified: Secondary | ICD-10-CM | POA: Diagnosis not present

## 2016-10-02 DIAGNOSIS — E1143 Type 2 diabetes mellitus with diabetic autonomic (poly)neuropathy: Secondary | ICD-10-CM | POA: Diagnosis not present

## 2016-10-02 DIAGNOSIS — Z880 Allergy status to penicillin: Secondary | ICD-10-CM | POA: Diagnosis not present

## 2016-10-02 DIAGNOSIS — K219 Gastro-esophageal reflux disease without esophagitis: Secondary | ICD-10-CM | POA: Diagnosis not present

## 2016-10-02 DIAGNOSIS — Z79899 Other long term (current) drug therapy: Secondary | ICD-10-CM | POA: Diagnosis not present

## 2016-10-02 DIAGNOSIS — F319 Bipolar disorder, unspecified: Secondary | ICD-10-CM | POA: Diagnosis not present

## 2016-10-02 DIAGNOSIS — Z7982 Long term (current) use of aspirin: Secondary | ICD-10-CM | POA: Diagnosis not present

## 2016-10-02 DIAGNOSIS — F419 Anxiety disorder, unspecified: Secondary | ICD-10-CM | POA: Diagnosis not present

## 2016-10-02 DIAGNOSIS — Z87891 Personal history of nicotine dependence: Secondary | ICD-10-CM | POA: Diagnosis not present

## 2016-10-02 DIAGNOSIS — E785 Hyperlipidemia, unspecified: Secondary | ICD-10-CM | POA: Diagnosis not present

## 2016-10-02 DIAGNOSIS — K589 Irritable bowel syndrome without diarrhea: Secondary | ICD-10-CM | POA: Diagnosis not present

## 2016-10-02 DIAGNOSIS — Z6841 Body Mass Index (BMI) 40.0 and over, adult: Secondary | ICD-10-CM | POA: Diagnosis not present

## 2016-10-02 DIAGNOSIS — I10 Essential (primary) hypertension: Secondary | ICD-10-CM | POA: Diagnosis not present

## 2016-10-02 HISTORY — DX: Cardiac murmur, unspecified: R01.1

## 2016-10-02 HISTORY — DX: Anxiety disorder, unspecified: F41.9

## 2016-10-02 HISTORY — DX: Personal history of other diseases of the digestive system: Z87.19

## 2016-10-02 HISTORY — DX: Dyspnea, unspecified: R06.00

## 2016-10-02 LAB — BASIC METABOLIC PANEL
Anion gap: 11 (ref 5–15)
BUN: 15 mg/dL (ref 6–20)
CALCIUM: 9.1 mg/dL (ref 8.9–10.3)
CHLORIDE: 102 mmol/L (ref 101–111)
CO2: 25 mmol/L (ref 22–32)
CREATININE: 0.94 mg/dL (ref 0.44–1.00)
GFR calc Af Amer: >60 mL/min (ref >60)
GFR calc non Af Amer: >60 mL/min (ref >60)
GLUCOSE: 177 mg/dL — AB (ref 65–99)
Potassium: 3.9 mmol/L (ref 3.5–5.1)
Sodium: 138 mmol/L (ref 135–145)

## 2016-10-02 LAB — CBC WITH DIFFERENTIAL/PLATELET
BASOS PCT: 0 %
Basophils Absolute: 0 10*3/uL (ref 0.0–0.1)
EOS ABS: 0.4 10*3/uL (ref 0.0–0.7)
EOS PCT: 3 %
HCT: 34.8 % — ABNORMAL LOW (ref 36.0–46.0)
Hemoglobin: 10.5 g/dL — ABNORMAL LOW (ref 12.0–15.0)
LYMPHS ABS: 2.6 10*3/uL (ref 0.7–4.0)
Lymphocytes Relative: 20 %
MCH: 23.9 pg — AB (ref 26.0–34.0)
MCHC: 30.2 g/dL (ref 30.0–36.0)
MCV: 79.1 fL (ref 78.0–100.0)
MONO ABS: 0.8 10*3/uL (ref 0.1–1.0)
Monocytes Relative: 6 %
NEUTROS ABS: 9.4 10*3/uL — AB (ref 1.7–7.7)
NEUTROS PCT: 71 %
PLATELETS: 468 10*3/uL — AB (ref 150–400)
RBC: 4.4 MIL/uL (ref 3.87–5.11)
RDW: 25.4 % — ABNORMAL HIGH (ref 11.5–15.5)
WBC: 13.2 10*3/uL — ABNORMAL HIGH (ref 4.0–10.5)

## 2016-10-02 NOTE — Progress Notes (Signed)
..[place] [id number]  PLEASE READ ALL INSTRUCTIONS CAREFULLY BEFORE YOUR COLONOSCOPY  The prescription for your prep kit has been sent electronically to your pharmacy.  If the pharmacy does not get the prescription please call and let me know.  PICK UP YOUR PRESCRIPTION NO LATER THAN 5 DAYS BEFORE YOUR PROCEDURE  Date and time to be at [facility] Stay:  Pre-OP 10/01/2016 at 2:45PM Procedure date:  10/04/16 at 9:40 AM   If you have any questions, you may call Ann at [phone].  You must have a responsible adult available to drive you home and be with you after the procedure.  If you do not have an available adult the hospital will not do the procedure.  Please notify us if you are diabetic, take blood thinners, iron or if you have an internal defibrillator device.    You can take your medication as normal unless something is listed below to hold or adjust.   Please stop the following medication:  Aspirins 2 days prior to procedure date   Diabetic medication adjustments: No diabetic medication evening before procedure and none the morning of procedure.  Keep a check on your blood sugar, if they get low you can drink apple juice, white grape juice or any juice of your choice as long as it is sweet and CLEAR.  On 10/02/2016  you can have regular food and drink until 2 pm and after 2:00 pm start clear liquids and do clear liquids the rest of the day, NO SOLID FOOD AFTER 2:00.  On 10/03/2016 you will continue clear liquids all day today, NO SOLID FOODS.  At 6:00 pm: Pour ONE (1) 6-ounce bottle of SUPREP liquid into the mixing container.  Add cool drinking water to the 16-ounce line on the container and mix. Drink ALL the liquid in the container. You must drink two (2) more 16-ounce containers of water over the next 1 hour.   At 9:00 pm: repeat steps from above using the other 6-ounce bottle.   Once you finish the solution you can go back on clear  liquids and you can continue clear liquids up until 4 hours to your arrival time at the hospital but nothing to eat or drink during the 4 hour time period before your procedure.  On the morning of the procedure give yourself 1 enema at least 1 hour before you leave to go to the hospital.  This will get the lower part of your colon cleaned out.  YOU WILL NOT NEED A PRESCRIPTION FOR THE ENEMA.  THIS IS A LIST OF CLEAR LIQUIDS THAT YOU CAN HAVE DURING YOUR CLEAR LIQUID TIME: Jell-O (NOT RED), popsicles (NOT RED), apple juice or white grape juice, Kool-Aid (NOT RED), soft drinks (any flavor except cherry), water, coffee or tea (NO CREAM or MILK), broth (beef/chicken/vegetable)  THIS LIST IS WHAT YOU CANNOT HAVE: beer, wine, liquor or any alcoholic beverage, dairy products, Swanson's broth, cranberry juice, tomato juice, V8 juice, grapefruit juice, orange juice, red grape juice, or any solid foods such as cereal, oatmeal, yogurt, fruits, vegetables, creamed soup, eggs, bread, etc.    Educate yourself to understand colonoscopies and your insurance  Types of colonoscopy (preventative, surveillance, or diagnostic)  Diagnostic/Therapeutic colonoscopy (CPT [phone] Colonoscopy, flexible, proximal to splenic flexure; diagnostic, with or without collection of specimen(s) by brushing or washing, with or without colon decompression (separate procedure)   Patient has a gastrointestinal sign, symptom(s), and/or diagnosis.(rectal bleeding, change  in stool, etc.)  Preventive colonoscopy screening (CPT [phone], [phone] Colorectal cancer screening; colonoscopy on individual not meeting criteria for high risk)   Patient is 64 years of age or older  Patient does not have any gastrointestinal sign, symptom(s), and/or relevant diagnosis  Patient does not have any personal history of colon cancer, polyps, and/or gastrointestinal disease  Patient may have a family history of gastrointestinal sign, symptom(s), and/or  relevant diagnosis  Surveillance colonoscopy (CPT [phone], [phone])   Patient does not have any gastrointestinal sign, symptom(s), and/or relevant diagnosis.  Patient has a personal history of colon cancer, polyps, and/or gastrointestinal disease.  IMPORTANT When the screening test results in the diagnosis of clinically significant colorectal adenomas or cancer, the patient will be followed by a surveillance regimen and recommendations for screening are no longer applicable."  The only exception is with Medicare.    Your insurance carrier may not pay in full and may apply your colonoscopy to your deductible if polyps are found during your screening exam.    A surveillance exam is not a screening exam.    Please ask your insurance carrier what your benefits are for screening, diagnositc and surveillance.  Your preventative wellness may not pay for your high risk colonoscopy at 100%. Our office DOES NOT check benefits.

## 2016-10-02 NOTE — Pre-Procedure Instructions (Signed)
WBC 13.2 and Glucose 468 from PAT labs today.  Routed results to Dr Luan Pulling and Dereck Leep.  Patient made aware of results and advised, per the direction of Dr Patsey Berthold to take usual dose of night insulin evening prior to procedure, rather than the instructed 1/2 dose.  Verbalized understanding.

## 2016-10-02 NOTE — Progress Notes (Signed)
..[place] [address]  PLEASE READ ALL INSTRUCTIONS CAREFULLY BEFORE YOUR COLONOSCOPY  The prescription for your prep kit has been sent electronically to your pharmacy.  If the pharmacy does not get the prescription please call and let me know.  PICK UP YOUR PRESCRIPTION NO LATER THAN 5 DAYS BEFORE YOUR PROCEDURE  Date and time to be at [place] [facility]: Pre-OP 10/01/16 at 2:45 PM    Please arrive for procedure at Short Stay [place]:   10/04/16 at 9:40 AM   If you have any questions, you may call Ann at [phone].  You must have a responsible adult available to drive you home and be with you after the procedure.  If you do not have an available adult the hospital will not do the procedure.  Please notify us if you are diabetic, take blood thinners, iron or if you have an internal defibrillator device.    You can take your medication as normal unless something is listed below to hold or adjust.   Please stop the following medication:  Aspirins 2 days before   Diabetic medication adjustments: Don't take diabetic medications evening before procedure and morning of procedure  Keep a check on your blood sugar, if they get low you can drink apple juice, white grape juice or any juice of your choice as long as it is sweet and CLEAR.  On 10/02/2016 you can have regular food and drink until 2 pm and after 2:00 pm start clear liquids and do clear liquids the rest of the day, NO SOLID FOOD AFTER 2:00.  On 10/03/2016 you will continue clear liquids all day today, NO SOLID FOODS.  At 6:00 pm: Pour ONE (1) 6-ounce bottle of SUPREP liquid into the mixing container.  Add cool drinking water to the 16-ounce line on the container and mix. Drink ALL the liquid in the container. You must drink two (2) more 16-ounce containers of water over the next 1 hour.   At 9:00 pm: repeat steps from above using the other 6-ounce bottle.   Once you finish the solution you  can go back on clear liquids and you can continue clear liquids up until 4 hours to your arrival time at the hospital but nothing to eat or drink during the 4 hour time period before your procedure.  On the morning of the procedure give yourself 1 enema at least 1 hour before you leave to go to the hospital.  This will get the lower part of your colon cleaned out.  YOU WILL NOT NEED A PRESCRIPTION FOR THE ENEMA.  THIS IS A LIST OF CLEAR LIQUIDS THAT YOU CAN HAVE DURING YOUR CLEAR LIQUID TIME: Jell-O (NOT RED), popsicles (NOT RED), apple juice or white grape juice, Kool-Aid (NOT RED), soft drinks (any flavor except cherry), water, coffee or tea (NO CREAM or MILK), broth (beef/chicken/vegetable)  THIS LIST IS WHAT YOU CANNOT HAVE: beer, wine, liquor or any alcoholic beverage, dairy products, Swanson's broth, cranberry juice, tomato juice, V8 juice, grapefruit juice, orange juice, red grape juice, or any solid foods such as cereal, oatmeal, yogurt, fruits, vegetables, creamed soup, eggs, bread, etc.    Educate yourself to understand colonoscopies and your insurance  Types of colonoscopy (preventative, surveillance, or diagnostic)  Diagnostic/Therapeutic colonoscopy (CPT [phone] Colonoscopy, flexible, proximal to splenic flexure; diagnostic, with or without collection of specimen(s) by brushing or washing, with or without colon decompression (separate procedure)   Patient has a gastrointestinal  sign, symptom(s), and/or diagnosis.(rectal bleeding, change in stool, etc.)  Preventive colonoscopy screening (CPT [phone], [phone] Colorectal cancer screening; colonoscopy on individual not meeting criteria for high risk)   Patient is 40 years of age or older  Patient does not have any gastrointestinal sign, symptom(s), and/or relevant diagnosis  Patient does not have any personal history of colon cancer, polyps, and/or gastrointestinal disease  Patient may have a family history of gastrointestinal sign,  symptom(s), and/or relevant diagnosis  Surveillance colonoscopy (CPT [phone], [phone])   Patient does not have any gastrointestinal sign, symptom(s), and/or relevant diagnosis.  Patient has a personal history of colon cancer, polyps, and/or gastrointestinal disease.  IMPORTANT When the screening test results in the diagnosis of clinically significant colorectal adenomas or cancer, the patient will be followed by a surveillance regimen and recommendations for screening are no longer applicable."  The only exception is with Medicare.    Your insurance carrier may not pay in full and may apply your colonoscopy to your deductible if polyps are found during your screening exam.    A surveillance exam is not a screening exam.    Please ask your insurance carrier what your benefits are for screening, diagnositc and surveillance.  Your preventative wellness may not pay for your high risk colonoscopy at 100%. Our office DOES NOT check benefits.

## 2016-10-03 ENCOUNTER — Encounter (INDEPENDENT_AMBULATORY_CARE_PROVIDER_SITE_OTHER): Payer: Self-pay | Admitting: *Deleted

## 2016-10-03 ENCOUNTER — Other Ambulatory Visit: Payer: Self-pay | Admitting: "Endocrinology

## 2016-10-03 NOTE — Pre-Procedure Instructions (Signed)
Per Dr Olevia Perches office, patient has cancelled procedure due to multiple issues.  WBC 13.2 and Glu 468 routed to PCP Dr Luan Pulling yesterday, however called and left message with Marletta Lor in office to have him review results as soon as possible.

## 2016-10-03 NOTE — Progress Notes (Signed)
..[place] [address]  PLEASE READ ALL INSTRUCTIONS CAREFULLY BEFORE YOUR COLONOSCOPY  The prescription for your prep kit has been sent electronically to your pharmacy.  If the pharmacy does not get the prescription please call and let me know.  PICK UP YOUR PRESCRIPTION NO LATER THAN 5 DAYS BEFORE YOUR PROCEDURE  Date and time to be at [place] Stay:   Friday June 22 at 9:40 AM  If you have any questions, you may call Ann at [phone].  You must have a responsible adult available to drive you home and be with you after the procedure.  If you do not have an available adult the hospital will not do the procedure.  Please notify us if you are diabetic, take blood thinners, iron or if you have an internal defibrillator device.   You can take your medication as normal unless something is listed below to hold or adjust.   Please stop the following medication: Aspirins 2 days before   Diabetic medication adjustments:  None the evening of your procedure and none the morning of your procedure.  Keep a check on your blood sugar, if they get low you can drink apple juice, white grape juice or any juice of your choice as long as it is sweet and CLEAR.  On 10/02/2016 you can have regular food and drink until 2 pm and after 2:00 pm start clear liquids and do clear liquids the rest of the day, NO SOLID FOOD AFTER 2:00.  On 10/03/2016 you will continue clear liquids all day today, NO SOLID FOODS.  At 6:00 pm: empty 1 pouch A and 1 pouch B into disposable container and add lukewarm water to fill line and shake.  The container is divided by 4 marks, every 15 minutes drink the solutions down to the next mark until you finish.  At 9:00 pm: Repeat with remaining pouches.  Once you finish the solution you can go back on clear liquids and you can continue clear liquids up until 4 hours to your arrival time at the hospital but nothing to eat or drink during the 4  hour time period before your procedure.  On the morning of the procedure give yourself 1 enema at least 1 hour before you leave to go to the hospital.  This will get the lower part of your colon cleaned out.  YOU WILL NOT NEED A PRESCRIPTION FOR THE ENEMA.  THIS IS A LIST OF CLEAR LIQUIDS THAT YOU CAN HAVE DURING YOUR CLEAR LIQUID TIME: Jell-O (NOT RED), popsicles (NOT RED), apple juice or white grape juice, Kool-Aid (NOT RED), soft drinks (any flavor except cherry), water, coffee or tea (NO CREAM or MILK), broth (beef/chicken/vegetable)  THIS LIST IS WHAT YOU CANNOT HAVE: beer, wine, liquor or any alcoholic beverage, dairy products, Swanson's broth, cranberry juice, tomato juice, V8 juice, grapefruit juice, orange juice, red grape juice, or any solid foods such as cereal, oatmeal, yogurt, fruits, vegetables, creamed soup, eggs, bread, etc.          Educate yourself to understand colonoscopies and your insurance  Types of colonoscopy (preventative, surveillance, or diagnostic)  Diagnostic/Therapeutic colonoscopy (CPT [phone] Colonoscopy, flexible, proximal to splenic flexure; diagnostic, with or without collection of specimen(s) by brushing or washing, with or without colon decompression (separate procedure)   Patient has a gastrointestinal sign, symptom(s), and/or diagnosis.(rectal bleeding, change in stool, etc.)  Preventive colonoscopy screening (CPT [phone], [phone] Colorectal cancer screening; colonoscopy on individual  not meeting criteria for high risk)   Patient is 64 years of age or older  Patient does not have any gastrointestinal sign, symptom(s), and/or relevant diagnosis  Patient does not have any personal history of colon cancer, polyps, and/or gastrointestinal disease  Patient may have a family history of gastrointestinal sign, symptom(s), and/or relevant diagnosis  Surveillance colonoscopy (CPT [phone], [phone])   Patient does not have any gastrointestinal sign,  symptom(s), and/or relevant diagnosis.  Patient has a personal history of colon cancer, polyps, and/or gastrointestinal disease.  IMPORTANT When the screening test results in the diagnosis of clinically significant colorectal adenomas or cancer, the patient will be followed by a surveillance regimen and recommendations for screening are no longer applicable."  The only exception is with Medicare.    Your insurance carrier may not pay in full and may apply your colonoscopy to your deductible if polyps are found during your screening exam.    A surveillance exam is not a screening exam.    Please ask your insurance carrier what your benefits are for screening, diagnositc and surveillance.  Your preventative wellness may not pay for your high risk colonoscopy at 100%. Our office DOES NOT check benefits.

## 2016-10-04 ENCOUNTER — Ambulatory Visit (HOSPITAL_COMMUNITY)
Admission: RE | Admit: 2016-10-04 | Discharge: 2016-10-04 | Disposition: A | Discharge status: Home / Self Care | Payer: PPO | Source: Ambulatory Visit | Attending: Internal Medicine | Admitting: Internal Medicine

## 2016-10-04 ENCOUNTER — Telehealth: Payer: Self-pay | Admitting: "Endocrinology

## 2016-10-04 ENCOUNTER — Encounter (HOSPITAL_COMMUNITY): Payer: Self-pay | Admitting: *Deleted

## 2016-10-04 ENCOUNTER — Ambulatory Visit (HOSPITAL_COMMUNITY): Payer: PPO | Admitting: Anesthesiology

## 2016-10-04 ENCOUNTER — Encounter (HOSPITAL_COMMUNITY)
Admission: RE | Disposition: A | Discharge status: Home / Self Care | Payer: Self-pay | Source: Ambulatory Visit | Attending: Internal Medicine

## 2016-10-04 DIAGNOSIS — Z6841 Body Mass Index (BMI) 40.0 and over, adult: Secondary | ICD-10-CM | POA: Diagnosis not present

## 2016-10-04 DIAGNOSIS — G4733 Obstructive sleep apnea (adult) (pediatric): Secondary | ICD-10-CM | POA: Insufficient documentation

## 2016-10-04 DIAGNOSIS — K589 Irritable bowel syndrome without diarrhea: Secondary | ICD-10-CM | POA: Insufficient documentation

## 2016-10-04 DIAGNOSIS — J449 Chronic obstructive pulmonary disease, unspecified: Secondary | ICD-10-CM | POA: Insufficient documentation

## 2016-10-04 DIAGNOSIS — E785 Hyperlipidemia, unspecified: Secondary | ICD-10-CM | POA: Diagnosis not present

## 2016-10-04 DIAGNOSIS — F419 Anxiety disorder, unspecified: Secondary | ICD-10-CM | POA: Diagnosis not present

## 2016-10-04 DIAGNOSIS — Z794 Long term (current) use of insulin: Secondary | ICD-10-CM | POA: Insufficient documentation

## 2016-10-04 DIAGNOSIS — Z87891 Personal history of nicotine dependence: Secondary | ICD-10-CM | POA: Insufficient documentation

## 2016-10-04 DIAGNOSIS — K219 Gastro-esophageal reflux disease without esophagitis: Secondary | ICD-10-CM | POA: Diagnosis not present

## 2016-10-04 DIAGNOSIS — F319 Bipolar disorder, unspecified: Secondary | ICD-10-CM | POA: Insufficient documentation

## 2016-10-04 DIAGNOSIS — D509 Iron deficiency anemia, unspecified: Secondary | ICD-10-CM | POA: Diagnosis not present

## 2016-10-04 DIAGNOSIS — I10 Essential (primary) hypertension: Secondary | ICD-10-CM | POA: Diagnosis not present

## 2016-10-04 DIAGNOSIS — Z880 Allergy status to penicillin: Secondary | ICD-10-CM | POA: Insufficient documentation

## 2016-10-04 DIAGNOSIS — D649 Anemia, unspecified: Secondary | ICD-10-CM | POA: Diagnosis not present

## 2016-10-04 DIAGNOSIS — Z79899 Other long term (current) drug therapy: Secondary | ICD-10-CM | POA: Insufficient documentation

## 2016-10-04 DIAGNOSIS — E1143 Type 2 diabetes mellitus with diabetic autonomic (poly)neuropathy: Secondary | ICD-10-CM | POA: Diagnosis not present

## 2016-10-04 DIAGNOSIS — Z7982 Long term (current) use of aspirin: Secondary | ICD-10-CM | POA: Insufficient documentation

## 2016-10-04 DIAGNOSIS — E039 Hypothyroidism, unspecified: Secondary | ICD-10-CM | POA: Diagnosis not present

## 2016-10-04 HISTORY — PX: COLONOSCOPY WITH PROPOFOL: SHX5780

## 2016-10-04 LAB — GLUCOSE, CAPILLARY
GLUCOSE-CAPILLARY: 131 mg/dL — AB (ref 65–99)
Glucose-Capillary: 146 mg/dL — ABNORMAL HIGH (ref 65–99)

## 2016-10-04 SURGERY — COLONOSCOPY WITH PROPOFOL
Anesthesia: Monitor Anesthesia Care

## 2016-10-04 MED ORDER — PROPOFOL 10 MG/ML IV BOLUS
INTRAVENOUS | Status: AC
  Filled 2016-10-04: qty 40

## 2016-10-04 MED ORDER — MIDAZOLAM HCL 2 MG/2ML IJ SOLN
1.0000 mg | INTRAMUSCULAR | Status: AC
  Administered 2016-10-04: 2 mg via INTRAVENOUS
  Filled 2016-10-04: qty 2

## 2016-10-04 MED ORDER — FENTANYL CITRATE (PF) 100 MCG/2ML IJ SOLN
INTRAMUSCULAR | Status: AC
  Filled 2016-10-04: qty 2

## 2016-10-04 MED ORDER — CHLORHEXIDINE GLUCONATE CLOTH 2 % EX PADS: 6.0000 | MEDICATED_PAD | Freq: Once | CUTANEOUS | Status: DC

## 2016-10-04 MED ORDER — FENTANYL CITRATE (PF) 100 MCG/2ML IJ SOLN
25.0000 ug | Freq: Once | INTRAMUSCULAR | Status: AC
  Administered 2016-10-04: 25 ug via INTRAVENOUS

## 2016-10-04 MED ORDER — PROPOFOL 500 MG/50ML IV EMUL
INTRAVENOUS | Status: DC | PRN
  Administered 2016-10-04: 150 ug/kg/min via INTRAVENOUS

## 2016-10-04 MED ORDER — LACTATED RINGERS IV SOLN
INTRAVENOUS | Status: DC
  Administered 2016-10-04: 12:00:00 via INTRAVENOUS

## 2016-10-04 MED ORDER — MIDAZOLAM HCL 2 MG/2ML IJ SOLN
INTRAMUSCULAR | Status: AC
  Filled 2016-10-04: qty 2

## 2016-10-04 NOTE — Transfer of Care (Signed)
Immediate Anesthesia Transfer of Care Note  Patient: Norma [name]  Procedure(s) Performed: Procedure(s) with comments: COLONOSCOPY WITH PROPOFOL (N/A) - 11:10  Patient Location: PACU  Anesthesia Type:MAC  Level of Consciousness: awake, alert , oriented and patient cooperative  Airway & Oxygen Therapy: Patient Spontanous Breathing  Post-op Assessment: Report given to RN and Post -op Vital signs reviewed and stable  Post vital signs: Reviewed and stable  Last Vitals:  Vitals:   10/04/16 1110 10/04/16 1115  BP:    Pulse:    Resp: 14 15  Temp:      Last Pain:  Vitals:   10/04/16 1018  TempSrc: Oral         Complications: No apparent anesthesia complications

## 2016-10-04 NOTE — Anesthesia Preprocedure Evaluation (Signed)
Anesthesia Evaluation  Patient identified by MRN, date of birth, ID band Patient awake    Reviewed: Allergy & Precautions, NPO status , Patient's Chart, lab work & pertinent test results  Airway Mallampati: II  TM Distance: >3 FB     Dental  (+) Edentulous Upper   Pulmonary shortness of breath and with exertion, sleep apnea , COPD, former smoker,    breath sounds clear to auscultation       Cardiovascular hypertension, Pt. on medications  Rhythm:Regular Rate:Normal     Neuro/Psych  Headaches, PSYCHIATRIC DISORDERS Anxiety Depression Bipolar Disorder  Neuromuscular disease    GI/Hepatic hiatal hernia, GERD  ,  Endo/Other  diabetes  Renal/GU      Musculoskeletal   Abdominal   Peds  Hematology   Anesthesia Other Findings   Reproductive/Obstetrics                             Anesthesia Physical Anesthesia Plan  ASA: III  Anesthesia Plan: MAC   Post-op Pain Management:    Induction: Intravenous  PONV Risk Score and Plan:   Airway Management Planned: Simple Face Mask  Additional Equipment:   Intra-op Plan:   Post-operative Plan:   Informed Consent: I have reviewed the patients History and Physical, chart, labs and discussed the procedure including the risks, benefits and alternatives for the proposed anesthesia with the patient or authorized representative who has indicated his/her understanding and acceptance.     Plan Discussed with:   Anesthesia Plan Comments:         Anesthesia Quick Evaluation

## 2016-10-04 NOTE — Telephone Encounter (Signed)
Norma [name] is calling requesting a 90 day supply on her [employer]okana, it will be cheaper for her that way, please advise?

## 2016-10-04 NOTE — Op Note (Signed)
[facility] Patient Name: Norma [name] Procedure Date: 10/04/2016 11:52 AM MRN: [id number] Date of Birth: [birth year] Attending MD: Hildred Laser , MD CSN: [id number] Age: 66 Admit Type: Outpatient Procedure:                Colonoscopy Indications:              Iron deficiency anemia Providers:                Hildred Laser, MD, Janeece Riggers, RN, Randa Spike,                            Technician Referring MD:             Jasper Loser. Luan Pulling, MD Medicines:                Propofol per Anesthesia Complications:            No immediate complications. Estimated Blood Loss:     Estimated blood loss: none. Procedure:                Pre-Anesthesia Assessment:                           - Prior to the procedure, a History and Physical                            was performed, and patient medications and                            allergies were reviewed. The patient's tolerance of                            previous anesthesia was also reviewed. The risks                            and benefits of the procedure and the sedation                            options and risks were discussed with the patient.                            All questions were answered, and informed consent                            was obtained. Prior Anticoagulants: The patient has                            taken no previous anticoagulant or antiplatelet                            agents. ASA Grade Assessment: III - A patient with                            severe systemic disease. After reviewing the risks  and benefits, the patient was deemed in                            satisfactory condition to undergo the procedure.                           After obtaining informed consent, the colonoscope                            was passed under direct vision. Throughout the                            procedure, the patient's blood pressure, pulse, and                            oxygen  saturations were monitored continuously. The                            [id number] ([id number]) scope was introduced through                            the and advanced to the the cecum, identified by                            appendiceal orifice and ileocecal valve. The                            colonoscopy was performed without difficulty. The                            patient tolerated the procedure well. The quality                            of the bowel preparation was adequate. The                            ileocecal valve, appendiceal orifice, and rectum                            were photographed. Scope In: 12:06:32 PM Scope Out: 12:26:22 PM Scope Withdrawal Time: 0 hours 13 minutes 45 seconds  Total Procedure Duration: 0 hours 19 minutes 50 seconds  Findings:      The perianal and digital rectal examinations were normal.      The colon (entire examined portion) appeared normal.      No additional abnormalities were found on retroflexion. Impression:               - The entire examined colon is normal.                           - No specimens collected.                           Comment: IDA most likely secondary to impaired iron  absorption in the setting of chronic PPI. She had                            EGD and January 2018 and therefore test does not to                            be repeated.                           If she does not respond to by mouth ironwill                            consider further evaluation. Moderate Sedation:      Per Anesthesia Care Recommendation:           - Patient has a contact number available for                            emergencies. The signs and symptoms of potential                            delayed complications were discussed with the                            patient. Return to normal activities tomorrow.                            Written discharge instructions were provided to the                             patient.                           - Patient has a contact number available for                            emergencies. The signs and symptoms of potential                            delayed complications were discussed with the                            patient. Return to normal activities tomorrow.                            Written discharge instructions were provided to the                            patient.                           - Resume previous diet today.                           - Continue present medications.                           -  Repeat colonoscopy. Procedure Code(s):        --- Professional ---                           [phone], Colonoscopy, flexible; diagnostic, including                            collection of specimen(s) by brushing or washing,                            when performed (separate procedure) Diagnosis Code(s):        --- Professional ---                           D50.9, Iron deficiency anemia, unspecified CPT copyright 2016 [employer]. All rights reserved. The codes documented in this report are preliminary and upon coder review may  be revised to meet current compliance requirements. Hildred Laser, MD Hildred Laser, MD 10/04/2016 12:33:39 PM This report has been signed electronically. Number of Addenda: 0

## 2016-10-04 NOTE — H&P (Signed)
Norma [name] is an 66 y.o. female.   Chief Complaint: Patient is here for colonoscopy. 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 She has history of iron deficiency anemia and has been on iron until a few months back. She denies melena or rectal bleeding hematuria or vaginal bleeding. She has chronic GERD and is on PPI. She had EGD back in January 2018. She takes Excedrin on when necessary basis for headache. Last colonoscopy was in December 2011 with removal of single small polyp and was a tubular adenoma. Family history is negative for CRC.  Past Medical History:  Diagnosis Date  . Anemia   . Anxiety   . Bipolar disorder ([place])   . Bulging lumbar disc    L3-4  . Chronic daily headache   . Chronic low back pain 09/20/2014  . COPD (chronic obstructive pulmonary disease) ([place])   . Degenerative arthritis   . Depression   . Diabetes mellitus   . Diabetes mellitus, type II ([place])   . DM type 2 with diabetic peripheral neuropathy ([place]) 09/20/2014  . Dyslipidemia   . Dyspnea   . Gastroparesis   . GERD (gastroesophageal reflux disease)   . Heart murmur   . History of hiatal hernia   . Hypertension   . Hypothyroidism   . IBS (irritable bowel syndrome)   . Memory difficulty 09/20/2014  . Morbid obesity ([place])   . Neuropathy   . Obstructive sleep apnea on CPAP   . Restless legs syndrome (RLS) 09/17/2012    Past Surgical History:  Procedure Laterality Date  . ABDOMINAL HYSTERECTOMY    . ARTHROSCOPY KNEE W/ DRILLING  06/2011   and Decemer of 2013.  [name] Carpel tunnel  1980's  . CHOLECYSTECTOMY    . ESOPHAGOGASTRODUODENOSCOPY N/A 04/25/2016   Procedure: ESOPHAGOGASTRODUODENOSCOPY (EGD);  Surgeon: Rogene Houston, MD;  Location: [facility];  Service: Endoscopy;  Laterality: N/A;  730  . RECTAL SURGERY     fissure  . SHOULDER SURGERY Left    arthroscopy in March of this year    Family History   Problem Relation Age of Onset  . Hypertension Mother   . Lymphoma Mother   . Depression Mother   . Arthritis Father   . Alcohol abuse Father   . Hypertension Sister   . Cancer Brother        kidney and lung  . Alcohol abuse Brother   . Alcohol abuse Paternal Uncle   . Alcohol abuse Paternal Grandfather   . Alcohol abuse Paternal Grandmother    Social History:  reports that she quit smoking about 45 years ago. Her smoking use included Cigarettes. She has never used smokeless tobacco. She reports that she does not drink alcohol or use drugs.  Allergies:  Allergies  Allergen Reactions  . Amoxicillin Anaphylaxis    Has patient had a PCN reaction causing immediate rash, facial/tongue/throat swelling, SOB or lightheadedness with hypotension: Yes Has patient had a PCN reaction causing severe rash involving mucus membranes or skin necrosis: Yes Has patient had a PCN reaction that required hospitalization: Yes Has patient had a PCN reaction occurring within the last 10 years: Yes If all of the above answers are "NO", then may proceed with Cephalosporin use.   [name] Hydrocodone Anaphylaxis  . Primidone Anaphylaxis  . Depacon [Valproic Acid]     Causes falls   . Lisinopril     Dr took off as  precaution    Medications Prior to Admission  Medication Sig Dispense Refill  . albuterol (PROVENTIL HFA;VENTOLIN HFA) 108 (90 Base) MCG/ACT inhaler Inhale 2 puffs into the lungs every 6 (six) hours as needed for wheezing or shortness of breath.    . Aspirin-Acetaminophen-Caffeine (EXCEDRIN MIGRAINE PO) Take 2 tablets by mouth 3 (three) times daily as needed (pain). Take with tramadol    . Biotin 10000 MCG TABS Take 10,000 mcg by mouth at bedtime.    [name] buPROPion (WELLBUTRIN XL) 300 MG 24 hr tablet TAKE ONE TABLET BY MOUTH EVERY MORNING. 30 tablet 2  . canagliflozin (INVOKANA) 100 MG TABS tablet Take 1 tablet (100 mg total) by mouth daily before breakfast. 30 tablet 3  . diclofenac sodium (VOLTAREN) 1  % GEL Apply 1 application topically as needed (pain).     . ferrous sulfate 325 (65 FE) MG EC tablet Take 325 mg by mouth 2 (two) times daily.    . fexofenadine (ALLEGRA) 180 MG tablet Take 180 mg by mouth daily.    [name] gabapentin (NEURONTIN) 300 MG capsule Take 1,200 mg by mouth 2 (two) times daily.    . Insulin Glargine (TOUJEO SOLOSTAR) 300 UNIT/ML SOPN Inject 20 Units into the skin at bedtime.    . lamoTRIgine (LAMICTAL) 150 MG tablet TAKE ONE TABLET BY MOUTH AT BEDTIME. 30 tablet 2  . levothyroxine (SYNTHROID, LEVOTHROID) 100 MCG tablet Take 100 mcg by mouth daily before breakfast.     . LITE TOUCH PEN NEEDLES 31G X 5 MM MISC USE ONE DAILY. 100 each 5  . losartan (COZAAR) 50 MG tablet Take 50 mg by mouth at bedtime.     . meclizine (ANTIVERT) 25 MG tablet Take 1 tablet (25 mg total) by mouth 3 (three) times daily as needed for dizziness. (Patient taking differently: Take 25 mg by mouth daily as needed for dizziness. ) 30 tablet 0  . metFORMIN (GLUCOPHAGE) 500 MG tablet Take 2 tablets (1,000 mg total) by mouth 2 (two) times daily with a meal. 60 tablet 3  . omeprazole (PRILOSEC) 40 MG capsule Take 40 mg by mouth 2 (two) times daily.    [name] oxymetazoline (AFRIN) 0.05 % nasal spray Place 1 spray into both nostrils as needed for congestion.    . pravastatin (PRAVACHOL) 20 MG tablet Take 20 mg by mouth every evening.   99  . promethazine (PHENERGAN) 25 MG tablet Take 25 mg by mouth at bedtime as needed for nausea or vomiting.     . pseudoephedrine (SUDAFED) 120 MG 12 hr tablet Take 120 mg by mouth at bedtime as needed for congestion.    [name] rOPINIRole (REQUIP) 0.5 MG tablet Take 2 mg by mouth daily. Takes 0.5 mg with nap as needed    . topiramate (TOPAMAX) 25 MG tablet 1 in the am and 2 in the pm 90 tablet 6  . traMADol (ULTRAM) 50 MG tablet Take 2 tablets (100 mg total) by mouth every 8 (eight) hours as needed for pain. 180 tablet 5  . venlafaxine XR (EFFEXOR-XR) 150 MG 24 hr capsule TAKE TWO (2)  CAPSULES BY MOUTH AT BEDTIME. 60 capsule 2  . EPINEPHrine (EPIPEN IJ) Inject 1 Dose as directed as needed (allergic reaction).     . nystatin-triamcinolone ointment (MYCOLOG) APPLY TWICE DAILY (Patient not taking: Reported on 09/27/2016) 60 g 0    Results for orders placed or performed during the hospital encounter of 10/04/16 (from the past 48 hour(s))  Glucose, capillary  Status: Abnormal   Collection Time: 10/04/16  9:54 AM  Result Value Ref Range   Glucose-Capillary 146 (H) 65 - 99 mg/dL   No results found.  ROS  Blood pressure (!) 101/45, pulse 83, temperature 98.9 F (37.2 C), temperature source Oral, resp. rate 16, height 5\' 4"  (1.626 m), weight 252 lb (114.3 kg), SpO2 96 %. Physical Exam  Constitutional:  Well-developed obese Caucasian female in NAD.  HENT:  Mouth/Throat: Oropharynx is clear and moist.  Eyes: Conjunctivae are normal. No scleral icterus.  Neck: No thyromegaly present.  Cardiovascular: Normal rate, regular rhythm and normal heart sounds.   No murmur heard. Respiratory: Effort normal and breath sounds normal.  GI:  Abdomen is full. She has erythematous skin rash along crease line at mid abdomen involving umbilicus. She has 2 superficial ulcers below this level. She has mild tenderness in epigastrium and perineum like region. No organomegaly or masses.  Musculoskeletal: She exhibits no edema.  Lymphadenopathy:    She has no cervical adenopathy.  Neurological: She is alert.  Skin: Skin is warm and dry.     Assessment/Plan Iron deficiency anemia. Diagnostic colonoscopy.    Hildred Laser, MD 10/04/2016, 11:38 AM

## 2016-10-04 NOTE — Discharge Instructions (Signed)
Resume usual medications including ferrous sulfate. Resume usual diet. No driving for 24 hours. H&H in one month. Office will call.   PATIENT INSTRUCTIONS POST-ANESTHESIA  IMMEDIATELY FOLLOWING SURGERY:  Do not drive or operate machinery for the first twenty four hours after surgery.  Do not make any important decisions for twenty four hours after surgery or while taking narcotic pain medications or sedatives.  If you develop intractable nausea and vomiting or a severe headache please notify your doctor immediately.  FOLLOW-UP:  Please make an appointment with your surgeon as instructed. You do not need to follow up with anesthesia unless specifically instructed to do so.  WOUND CARE INSTRUCTIONS (if applicable):  Keep a dry clean dressing on the anesthesia/puncture wound site if there is drainage.  Once the wound has quit draining you may leave it open to air.  Generally you should leave the bandage intact for twenty four hours unless there is drainage.  If the epidural site drains for more than 36-48 hours please call the anesthesia department.  QUESTIONS?:  Please feel free to call your physician or the hospital operator if you have any questions, and they will be happy to assist you.       Colonoscopy, Adult, Care After This sheet gives you information about how to care for yourself after your procedure. Your doctor may also give you more specific instructions. If you have problems or questions, call your doctor. Follow these instructions at home: General instructions   For the first 24 hours after the procedure: ? Do not drive or use machinery. ? Do not sign important documents. ? Do not drink alcohol. ? Do your daily activities more slowly than normal. ? Eat foods that are soft and easy to digest. ? Rest often.  Take over-the-counter or prescription medicines only as told by your doctor.  It is up to you to get the results of your procedure. Ask your doctor, or the  department performing the procedure, when your results will be ready. To help cramping and bloating:  Try walking around.  Put heat on your belly (abdomen) as told by your doctor. Use a heat source that your doctor recommends, such as a moist heat pack or a heating pad. ? Put a towel between your skin and the heat source. ? Leave the heat on for 20-30 minutes. ? Remove the heat if your skin turns bright red. This is especially important if you cannot feel pain, heat, or cold. You can get burned. Eating and drinking  Drink enough fluid to keep your pee (urine) clear or pale yellow.  Return to your normal diet as told by your doctor. Avoid heavy or fried foods that are hard to digest.  Avoid drinking alcohol for as long as told by your doctor. Contact a doctor if:  You have blood in your poop (stool) 2-3 days after the procedure. Get help right away if:  You have more than a small amount of blood in your poop.  You see large clumps of tissue (blood clots) in your poop.  Your belly is swollen.  You feel sick to your stomach (nauseous).  You throw up (vomit).  You have a fever.  You have belly pain that gets worse, and medicine does not help your pain. This information is not intended to replace advice given to you by your health care provider. Make sure you discuss any questions you have with your health care provider. Document Released: 05/04/2010 Document Revised: 12/25/2015 Document Reviewed: 12/25/2015  Chartered certified accountant Patient Education  [employer].

## 2016-10-04 NOTE — Addendum Note (Signed)
Addendum  created 10/04/16 1502 by Mickel Baas, CRNA   Charge Capture section accepted

## 2016-10-04 NOTE — Anesthesia Procedure Notes (Signed)
Procedure Name: MAC Date/Time: 10/04/2016 11:53 AM Performed by: Andree Elk, Demario Faniel A Pre-anesthesia Checklist: Patient identified, Emergency Drugs available, Suction available, Patient being monitored and Timeout performed Oxygen Delivery Method: Simple face mask

## 2016-10-04 NOTE — Anesthesia Postprocedure Evaluation (Signed)
Anesthesia Post Note  Patient: Norma [name]  Procedure(s) Performed: Procedure(s) (LRB): COLONOSCOPY WITH PROPOFOL (N/A)  Patient location during evaluation: PACU Anesthesia Type: MAC Level of consciousness: awake and alert and oriented Pain management: pain level controlled Vital Signs Assessment: post-procedure vital signs reviewed and stable Respiratory status: spontaneous breathing and respiratory function stable Cardiovascular status: stable Postop Assessment: no signs of nausea or vomiting Anesthetic complications: no     Last Vitals:  Vitals:   10/04/16 1110 10/04/16 1115  BP:    Pulse:    Resp: 14 15  Temp:      Last Pain:  Vitals:   10/04/16 1018  TempSrc: Oral                 ADAMS, AMY A

## 2016-10-07 NOTE — Telephone Encounter (Signed)
Rx faxed

## 2016-10-08 ENCOUNTER — Encounter (HOSPITAL_COMMUNITY): Payer: Self-pay | Admitting: Internal Medicine

## 2016-10-08 DIAGNOSIS — M5114 Intervertebral disc disorders with radiculopathy, thoracic region: Secondary | ICD-10-CM | POA: Diagnosis not present

## 2016-10-14 DIAGNOSIS — K210 Gastro-esophageal reflux disease with esophagitis: Secondary | ICD-10-CM | POA: Diagnosis not present

## 2016-10-14 DIAGNOSIS — M1990 Unspecified osteoarthritis, unspecified site: Secondary | ICD-10-CM | POA: Diagnosis not present

## 2016-10-14 DIAGNOSIS — J449 Chronic obstructive pulmonary disease, unspecified: Secondary | ICD-10-CM | POA: Diagnosis not present

## 2016-10-14 DIAGNOSIS — G4730 Sleep apnea, unspecified: Secondary | ICD-10-CM | POA: Diagnosis not present

## 2016-10-24 ENCOUNTER — Other Ambulatory Visit: Payer: Self-pay | Admitting: "Endocrinology

## 2016-10-24 DIAGNOSIS — E1165 Type 2 diabetes mellitus with hyperglycemia: Secondary | ICD-10-CM | POA: Diagnosis not present

## 2016-10-24 DIAGNOSIS — E118 Type 2 diabetes mellitus with unspecified complications: Secondary | ICD-10-CM | POA: Diagnosis not present

## 2016-10-24 DIAGNOSIS — Z794 Long term (current) use of insulin: Secondary | ICD-10-CM | POA: Diagnosis not present

## 2016-10-24 LAB — COMPREHENSIVE METABOLIC PANEL
ALK PHOS: 58 U/L (ref 33–130)
ALT: 11 U/L (ref 6–29)
AST: 11 U/L (ref 10–35)
Albumin: 3.7 g/dL (ref 3.6–5.1)
BUN: 12 mg/dL (ref 7–25)
CO2: 26 mmol/L (ref 20–31)
CREATININE: 0.92 mg/dL (ref 0.50–0.99)
Calcium: 9.2 mg/dL (ref 8.6–10.4)
Chloride: 103 mmol/L (ref 98–110)
GLUCOSE: 129 mg/dL — AB (ref 65–99)
Potassium: 4.3 mmol/L (ref 3.5–5.3)
SODIUM: 138 mmol/L (ref 135–146)
TOTAL PROTEIN: 6.6 g/dL (ref 6.1–8.1)
Total Bilirubin: 0.2 mg/dL (ref 0.2–1.2)

## 2016-10-25 LAB — HEMOGLOBIN A1C
HEMOGLOBIN A1C: 6.2 % — AB (ref <5.7)
Mean Plasma Glucose: 131 mg/dL

## 2016-10-30 ENCOUNTER — Telehealth (INDEPENDENT_AMBULATORY_CARE_PROVIDER_SITE_OTHER): Payer: Self-pay | Admitting: Internal Medicine

## 2016-10-30 ENCOUNTER — Ambulatory Visit: Payer: PPO | Admitting: "Endocrinology

## 2016-10-30 NOTE — Telephone Encounter (Signed)
Report to PCP

## 2016-10-30 NOTE — Telephone Encounter (Signed)
Patient called, stated that she had an esophageal manometry done on June 5 and she still has not gotten the results.  She stated that she knows it was taking longer than normal to read it, but over a month is ridiculous.    [phone]

## 2016-10-30 NOTE — Telephone Encounter (Signed)
Esophageal manometry results are retrieved from [place] and reviewed with patient. Incomplete relaxation of LES consistent with EG outflow obstruction. However she does not have dysphagia. No abnormality to account for chest pain. Report to PCP.

## 2016-11-01 ENCOUNTER — Ambulatory Visit: Payer: Self-pay | Admitting: Cardiology

## 2016-11-21 ENCOUNTER — Ambulatory Visit: Payer: PPO | Admitting: "Endocrinology

## 2016-12-06 ENCOUNTER — Encounter: Payer: Self-pay | Admitting: "Endocrinology

## 2016-12-06 ENCOUNTER — Ambulatory Visit (INDEPENDENT_AMBULATORY_CARE_PROVIDER_SITE_OTHER): Payer: PPO | Admitting: "Endocrinology

## 2016-12-06 VITALS — BP 142/81 | HR 93 | Ht 64.0 in | Wt 252.0 lb

## 2016-12-06 DIAGNOSIS — I10 Essential (primary) hypertension: Secondary | ICD-10-CM | POA: Diagnosis not present

## 2016-12-06 DIAGNOSIS — E1165 Type 2 diabetes mellitus with hyperglycemia: Secondary | ICD-10-CM

## 2016-12-06 DIAGNOSIS — E038 Other specified hypothyroidism: Secondary | ICD-10-CM

## 2016-12-06 DIAGNOSIS — E782 Mixed hyperlipidemia: Secondary | ICD-10-CM

## 2016-12-06 DIAGNOSIS — Z794 Long term (current) use of insulin: Secondary | ICD-10-CM

## 2016-12-06 DIAGNOSIS — E118 Type 2 diabetes mellitus with unspecified complications: Secondary | ICD-10-CM | POA: Diagnosis not present

## 2016-12-06 DIAGNOSIS — IMO0002 Reserved for concepts with insufficient information to code with codable children: Secondary | ICD-10-CM

## 2016-12-06 NOTE — Progress Notes (Signed)
Subjective:    Patient ID: Norma [name], female    DOB: [birth year],    Past Medical History:  Diagnosis Date  . Anemia   . Anxiety   . Bipolar disorder ([place])   . Bulging lumbar disc    L3-4  . Chronic daily headache   . Chronic low back pain 09/20/2014  . COPD (chronic obstructive pulmonary disease) ([place])   . Degenerative arthritis   . Depression   . Diabetes mellitus   . Diabetes mellitus, type II ([place])   . DM type 2 with diabetic peripheral neuropathy ([place]) 09/20/2014  . Dyslipidemia   . Dyspnea   . Gastroparesis   . GERD (gastroesophageal reflux disease)   . Heart murmur   . History of hiatal hernia   . Hypertension   . Hypothyroidism   . IBS (irritable bowel syndrome)   . Memory difficulty 09/20/2014  . Morbid obesity ([place])   . Neuropathy   . Obstructive sleep apnea on CPAP   . Restless legs syndrome (RLS) 09/17/2012   Past Surgical History:  Procedure Laterality Date  . ABDOMINAL HYSTERECTOMY    . ARTHROSCOPY KNEE W/ DRILLING  06/2011   and Decemer of 2013.  [name] Carpel tunnel  1980's  . CHOLECYSTECTOMY    . COLONOSCOPY WITH PROPOFOL N/A 10/04/2016   Procedure: COLONOSCOPY WITH PROPOFOL;  Surgeon: Rogene Houston, MD;  Location: [facility];  Service: Endoscopy;  Laterality: N/A;  11:10  . ESOPHAGOGASTRODUODENOSCOPY N/A 04/25/2016   Procedure: ESOPHAGOGASTRODUODENOSCOPY (EGD);  Surgeon: Rogene Houston, MD;  Location: [facility];  Service: Endoscopy;  Laterality: N/A;  730  . RECTAL SURGERY     fissure  . SHOULDER SURGERY Left    arthroscopy in March of this year   Social History   Social History  . Marital status: Single    Spouse name: N/A  . Number of children: 0  . Years of education: 44   Occupational History  .  [employer]   Social History Main Topics  . Smoking status: Former Smoker    Types: Cigarettes    Quit date: 02/04/1971  . Smokeless tobacco: Never Used     Comment: smoked 2 cigarettes a day  . Alcohol use No  . Drug  use: No  . Sexual activity: No   Other Topics Concern  . None   Social History Narrative   Patient lives at home alone.    Patient has no children.    Patient has her masters in nursing.    Patient is single.    Patient drinks about 2 glasses of tea daily.   Patient is right handed.   Outpatient Encounter Prescriptions as of 12/06/2016  Medication Sig  . albuterol (PROVENTIL HFA;VENTOLIN HFA) 108 (90 Base) MCG/ACT inhaler Inhale 2 puffs into the lungs every 6 (six) hours as needed for wheezing or shortness of breath.  . Aspirin-Acetaminophen-Caffeine (EXCEDRIN MIGRAINE PO) Take 2 tablets by mouth 3 (three) times daily as needed (pain). Take with tramadol  . Biotin 10000 MCG TABS Take 10,000 mcg by mouth at bedtime.  [name] buPROPion (WELLBUTRIN XL) 300 MG 24 hr tablet TAKE ONE TABLET BY MOUTH EVERY MORNING.  . diclofenac sodium (VOLTAREN) 1 % GEL Apply 1 application topically as needed (pain).   [name] EPINEPHrine (EPIPEN IJ) Inject 1 Dose as directed as needed (allergic reaction).   . ferrous sulfate 325 (65 FE) MG EC tablet Take 325 mg by mouth 2 (two) times daily.  [name]  fexofenadine (ALLEGRA) 180 MG tablet Take 180 mg by mouth daily.  [name] gabapentin (NEURONTIN) 300 MG capsule Take 1,200 mg by mouth 2 (two) times daily.  . Insulin Glargine (TOUJEO SOLOSTAR) 300 UNIT/ML SOPN Inject 20 Units into the skin at bedtime.  . INVOKANA 100 MG TABS tablet TAKE ONE TABLET BY MOUTH DAILY BEFORE BREAKFAST  . lamoTRIgine (LAMICTAL) 150 MG tablet TAKE ONE TABLET BY MOUTH AT BEDTIME.  [name] levothyroxine (SYNTHROID, LEVOTHROID) 100 MCG tablet Take 100 mcg by mouth daily before breakfast.   . LITE TOUCH PEN NEEDLES 31G X 5 MM MISC USE ONE DAILY.  [name] losartan (COZAAR) 50 MG tablet Take 50 mg by mouth at bedtime.   . meclizine (ANTIVERT) 25 MG tablet Take 1 tablet (25 mg total) by mouth 3 (three) times daily as needed for dizziness. (Patient taking differently: Take 25 mg by mouth daily as needed for dizziness. )  .  metFORMIN (GLUCOPHAGE) 500 MG tablet Take 2 tablets (1,000 mg total) by mouth 2 (two) times daily with a meal.  . omeprazole (PRILOSEC) 40 MG capsule Take 40 mg by mouth 2 (two) times daily.  [name] oxymetazoline (AFRIN) 0.05 % nasal spray Place 1 spray into both nostrils as needed for congestion.  . pravastatin (PRAVACHOL) 20 MG tablet Take 20 mg by mouth every evening.   . promethazine (PHENERGAN) 25 MG tablet Take 25 mg by mouth at bedtime as needed for nausea or vomiting.   . pseudoephedrine (SUDAFED) 120 MG 12 hr tablet Take 120 mg by mouth at bedtime as needed for congestion.  [name] rOPINIRole (REQUIP) 0.5 MG tablet Take 2 mg by mouth daily. Takes 0.5 mg with nap as needed  . topiramate (TOPAMAX) 25 MG tablet 1 in the am and 2 in the pm  . traMADol (ULTRAM) 50 MG tablet Take 2 tablets (100 mg total) by mouth every 8 (eight) hours as needed for pain.  [name] venlafaxine XR (EFFEXOR-XR) 150 MG 24 hr capsule TAKE TWO (2) CAPSULES BY MOUTH AT BEDTIME.   No facility-administered encounter medications on file as of 12/06/2016.    ALLERGIES: Allergies  Allergen Reactions  . Amoxicillin Anaphylaxis    Has patient had a PCN reaction causing immediate rash, facial/tongue/throat swelling, SOB or lightheadedness with hypotension: Yes Has patient had a PCN reaction causing severe rash involving mucus membranes or skin necrosis: Yes Has patient had a PCN reaction that required hospitalization: Yes Has patient had a PCN reaction occurring within the last 10 years: Yes If all of the above answers are "NO", then may proceed with Cephalosporin use.   [name] Hydrocodone Anaphylaxis  . Primidone Anaphylaxis  . Depacon [Valproic Acid]     Causes falls   . Lisinopril     Dr took off as precaution   VACCINATION STATUS:  There is no immunization history on file for this patient.  Diabetes  She presents for her follow-up diabetic visit. She has type 2 diabetes mellitus. Onset time: She was diagnosed at approximate age  of 60 years. Her disease course has been improving. There are no hypoglycemic associated symptoms. Pertinent negatives for hypoglycemia include no confusion, headaches, pallor or seizures. Associated symptoms include fatigue. Pertinent negatives for diabetes include no chest pain, no polydipsia, no polyphagia and no polyuria. There are no hypoglycemic complications. Symptoms are improving. There are no diabetic complications. Risk factors for coronary artery disease include diabetes mellitus, dyslipidemia, hypertension, obesity and sedentary lifestyle. Current diabetic treatment includes oral agent (dual therapy). She is compliant with  treatment most of the time. Her weight is stable. She is following a generally unhealthy diet. She has not had a previous visit with a dietitian. She never participates in exercise. Her breakfast blood glucose range is generally 140-180 mg/dl. Her overall blood glucose range is 140-180 mg/dl. An ACE inhibitor/angiotensin II receptor blocker is being taken. Eye exam is current.  Hypertension  This is a chronic problem. The current episode started more than 1 year ago. Pertinent negatives include no chest pain, headaches, palpitations or shortness of breath. Past treatments include ACE inhibitors. Compliance problems include diet, exercise, medication cost, medication side effects and psychosocial issues.  Identifiable causes of hypertension include a thyroid problem.  Hyperlipidemia  This is a chronic problem. The current episode started more than 1 year ago. Pertinent negatives include no chest pain, myalgias or shortness of breath. Current antihyperlipidemic treatment includes statins. Risk factors for coronary artery disease include a sedentary lifestyle, obesity, hypertension, diabetes mellitus and dyslipidemia.  Thyroid Problem  Presents for follow-up visit. Symptoms include fatigue. Patient reports no cold intolerance, diarrhea, heat intolerance or palpitations. The  symptoms have been stable. Past treatments include levothyroxine. Her past medical history is significant for hyperlipidemia.     Review of Systems  Constitutional: Positive for fatigue. Negative for unexpected weight change.  HENT: Negative for trouble swallowing and voice change.   Eyes: Negative for visual disturbance.  Respiratory: Negative for cough, shortness of breath and wheezing.   Cardiovascular: Negative for chest pain, palpitations and leg swelling.  Gastrointestinal: Negative for diarrhea, nausea and vomiting.  Endocrine: Negative for cold intolerance, heat intolerance, polydipsia, polyphagia and polyuria.  Musculoskeletal: Negative for arthralgias and myalgias.  Skin: Negative for color change, pallor, rash and wound.  Neurological: Negative for seizures and headaches.  Psychiatric/Behavioral: Negative for confusion and suicidal ideas.    Objective:    BP (!) 142/81   Pulse 93   Ht 5\' 4"  (1.626 m)   Wt 252 lb (114.3 kg)   BMI 43.26 kg/m   Wt Readings from Last 3 Encounters:  12/06/16 252 lb (114.3 kg)  10/04/16 252 lb (114.3 kg)  10/02/16 252 lb (114.3 kg)    Physical Exam  Constitutional: She is oriented to person, place, and time. She appears well-developed.  HENT:  Head: Normocephalic and atraumatic.  Eyes: EOM are normal.  Neck: Normal range of motion. Neck supple. No tracheal deviation present. No thyromegaly present.  Cardiovascular: Normal rate and regular rhythm.   Pulmonary/Chest: Effort normal and breath sounds normal.  Abdominal: Soft. Bowel sounds are normal. There is no tenderness. There is no guarding.  Musculoskeletal: Normal range of motion. She exhibits no edema.  Neurological: She is alert and oriented to person, place, and time. She has normal reflexes. No cranial nerve deficit. Coordination normal.  Skin: Skin is warm and dry. No rash noted. No erythema. No pallor.  Psychiatric: She has a normal mood and affect. Judgment normal.     Results for orders placed or performed in visit on 10/24/16  Comprehensive metabolic panel  Result Value Ref Range   Sodium 138 135 - 146 mmol/L   Potassium 4.3 3.5 - 5.3 mmol/L   Chloride 103 98 - 110 mmol/L   CO2 26 20 - 31 mmol/L   Glucose, Bld 129 (H) 65 - 99 mg/dL   BUN 12 7 - 25 mg/dL   Creat 0.92 0.50 - 0.99 mg/dL   Total Bilirubin 0.2 0.2 - 1.2 mg/dL   Alkaline Phosphatase 58 33 -  130 U/L   AST 11 10 - 35 U/L   ALT 11 6 - 29 U/L   Total Protein 6.6 6.1 - 8.1 g/dL   Albumin 3.7 3.6 - 5.1 g/dL   Calcium 9.2 8.6 - 10.4 mg/dL  Hemoglobin A1c  Result Value Ref Range   Hgb A1c MFr Bld 6.2 (H) <5.7 %   Mean Plasma Glucose 131 mg/dL   Complete Blood Count (Most recent): Lab Results  Component Value Date   WBC 13.2 (H) 10/02/2016   HGB 10.5 (L) 10/02/2016   HCT 34.8 (L) 10/02/2016   MCV 79.1 10/02/2016   PLT 468 (H) 10/02/2016   Diabetic Labs (most recent): Lab Results  Component Value Date   HGBA1C 6.2 (H) 10/24/2016   HGBA1C 6.5 (H) 07/10/2016   HGBA1C 6.6 (H) 03/27/2016    Assessment & Plan:   1. Uncontrolled type 2 diabetes mellitus with complication, without long-term current use of insulin (HCC)  Her diabetes is  complicated by neuropathy and patient remains at a high risk for more acute and chronic complications of diabetes which include CAD, CVA, CKD, retinopathy, and neuropathy. These are all discussed in detail with the patient.  Patient came with better control of her diabetes with A1c improving to 6.2%, Slowly improving from  9% .  - I have re-counseled the patient on diet management and weight loss  by adopting a carbohydrate restricted / protein rich  Diet.  - Suggestion is made for her to avoid simple carbohydrates  from her diet including Cakes, Sweet Desserts, Ice Cream, Soda (diet and regular), Sweet Tea, Candies, Chips, Cookies, Store Bought Juices, Alcohol in Excess of  1-2 drinks a day, Artificial Sweeteners, and "Sugar-free" Products. This  will help patient to have stable blood glucose profile and potentially avoid unintended weight gain.   - Patient is advised to stick to a routine mealtimes to eat 3 meals  a day and avoid unnecessary snacks ( to snack only to correct hypoglycemia).  - I have approached patient with the following individualized plan to manage diabetes and patient agrees.  -  She will continue to  require basal insulin treatment for her diabetes, although she has some chance to come off of it. -I will proceed  with basal insulin Toujeo 20 units daily at bedtime associated with monitoring of blood glucose before breakfast and at bedtime .  - I will continue on metformin 1000 mg by mouth twice a day and  Invokana  100mg  po qam.   - Patient will be considered for incretin therapy as appropriate next visit. - Patient specific target  for A1c; LDL, HDL, Triglycerides, and  Waist Circumference were discussed in detail.  2) BP/HTN: uncontrolled. Continue current medications including ACEI/ARB. 3) Lipids/HPL:  continue statins. 4)  Weight/Diet: CDE consult in progress, exercise, and carbohydrates information provided.  5) Hypothyroidism  - Her thyroid function tests are consistent with appropriate replacement.  I advised her to continue levothyroxine 100 g by mouth every morning.  - We discussed about correct intake of levothyroxine, at fasting, with water, separated by at least 30 minutes from breakfast, and separated by more than 4 hours from calcium, iron, multivitamins, acid reflux medications (PPIs). -Patient is made aware of the fact that thyroid hormone replacement is needed for life, dose to be adjusted by periodic monitoring of thyroid function tests.  6) Chronic Care/Health Maintenance:  -Patient is on ACEI/ARB and Statin medications and encouraged to continue to follow up with Ophthalmology, Podiatrist at  least yearly or according to recommendations, and advised to  stay away from smoking. I have  recommended yearly flu vaccine and pneumonia vaccination at least every 5 years; moderate intensity exercise for up to 150 minutes weekly; and  sleep for at least 7 hours a day.  I advised patient to maintain close follow up with her PCP for primary care needs.  Patient is asked to bring meter and  blood glucose logs during her next visit.  - Time spent with the patient: 25 min, of which >50% was spent in reviewing her sugar logs , discussing her hypo- and hyper-glycemic episodes, reviewing her current and  previous labs and insulin doses and developing a plan to avoid hypo- and hyper-glycemia.   Follow up plan: Return in about 3 months (around 03/08/2017) for follow up with pre-visit labs, meter, and logs.  Glade Lloyd, MD Phone: [phone]  Fax: [phone]  This note was partially dictated with voice recognition software. Similar sounding words can be transcribed inadequately or may not  be corrected upon review.  12/06/2016, 11:47 AM

## 2016-12-06 NOTE — Patient Instructions (Signed)

## 2016-12-22 NOTE — Progress Notes (Deleted)
[facility]  PATIENT: Norma [name] DOB: 07-22-1950   REASON FOR VISIT: Follow-up for tremor HISTORY FROM: Patient    HISTORY OF PRESENT ILLNESS:UPDATE 06/21/2016 [name], 66 year old female returns for follow-up with history of essential tremor morbid obesity diabetes obstructive sleep apnea with CPAP. When last seen by Dr. Jannifer Franklin in October she was having increased sleepiness and fatigue .she was slowly titrated off of clonazepam. Her daytime drowsiness has improved. She was also placed on Topamax 25 mg twice a day for tremor. Her tremor is in fair control. The patient will have good days and bad days with the tremor, occasionally she has difficulty performing handwriting, some days she does much better. Her handwriting on intake form today is very little legible  She is also on Lyrica restless leg syndrome. The patient is not bothered with restless leg syndrome much at this time. She indicates that her CPAP machine was interrogated recently at Dr Luan Pulling office , no adjustments were made to the settings. she also complains of some dizziness however on further questioning she does not have a lot of by mouth intake. She reports her CBGs this morning was 150. She also has a history of bipolar disorder which is currently being treated by her primary care physician She comes back to this office for an evaluation.  Her problems with memory also improved coming off of the clonazepam   REVIEW OF SYSTEMS: Full 14 system review of systems performed and notable only for those listed, all others are neg:  Constitutional: neg  Cardiovascular: neg Ear/Nose/Throat: neg  Skin: neg Eyes: neg Respiratory:Shortness of breath Gastroitestinal: neg  Hematology/Lymphatic: neg  Endocrine: neg Musculoskeletal: joint pain Allergy/Immunology: neg Neurological dizziness tremors Psychiatric depression SleepObstructive sleep apnea with CPAP  ALLERGIES: Allergies  Allergen Reactions    . Amoxicillin Anaphylaxis    Has patient had a PCN reaction causing immediate rash, facial/tongue/throat swelling, SOB or lightheadedness with hypotension: Yes Has patient had a PCN reaction causing severe rash involving mucus membranes or skin necrosis: Yes Has patient had a PCN reaction that required hospitalization: Yes Has patient had a PCN reaction occurring within the last 10 years: Yes If all of the above answers are "NO", then may proceed with Cephalosporin use.   [name] Hydrocodone Anaphylaxis  . Primidone Anaphylaxis  . Depacon [Valproic Acid]     Causes falls   . Lisinopril     Dr took off as precaution    HOME MEDICATIONS: Outpatient Medications Prior to Visit  Medication Sig Dispense Refill  . albuterol (PROVENTIL HFA;VENTOLIN HFA) 108 (90 Base) MCG/ACT inhaler Inhale 2 puffs into the lungs every 6 (six) hours as needed for wheezing or shortness of breath.    . Aspirin-Acetaminophen-Caffeine (EXCEDRIN MIGRAINE PO) Take 2 tablets by mouth 3 (three) times daily as needed (pain). Take with tramadol    . Biotin 10000 MCG TABS Take 10,000 mcg by mouth at bedtime.    [name] buPROPion (WELLBUTRIN XL) 300 MG 24 hr tablet TAKE ONE TABLET BY MOUTH EVERY MORNING. 30 tablet 2  . diclofenac sodium (VOLTAREN) 1 % GEL Apply 1 application topically as needed (pain).     [name] EPINEPHrine (EPIPEN IJ) Inject 1 Dose as directed as needed (allergic reaction).     . ferrous sulfate 325 (65 FE) MG EC tablet Take 325 mg by mouth 2 (two) times daily.    . fexofenadine (ALLEGRA) 180 MG tablet Take 180 mg by mouth daily.    [name] gabapentin (NEURONTIN) 300  MG capsule Take 1,200 mg by mouth 2 (two) times daily.    . Insulin Glargine (TOUJEO SOLOSTAR) 300 UNIT/ML SOPN Inject 20 Units into the skin at bedtime.    . INVOKANA 100 MG TABS tablet TAKE ONE TABLET BY MOUTH DAILY BEFORE BREAKFAST 90 tablet 0  . lamoTRIgine (LAMICTAL) 150 MG tablet TAKE ONE TABLET BY MOUTH AT BEDTIME. 30 tablet 2  . levothyroxine (SYNTHROID,  LEVOTHROID) 100 MCG tablet Take 100 mcg by mouth daily before breakfast.     . LITE TOUCH PEN NEEDLES 31G X 5 MM MISC USE ONE DAILY. 100 each 5  . losartan (COZAAR) 50 MG tablet Take 50 mg by mouth at bedtime.     . meclizine (ANTIVERT) 25 MG tablet Take 1 tablet (25 mg total) by mouth 3 (three) times daily as needed for dizziness. (Patient taking differently: Take 25 mg by mouth daily as needed for dizziness. ) 30 tablet 0  . metFORMIN (GLUCOPHAGE) 500 MG tablet Take 2 tablets (1,000 mg total) by mouth 2 (two) times daily with a meal. 60 tablet 3  . omeprazole (PRILOSEC) 40 MG capsule Take 40 mg by mouth 2 (two) times daily.    [name] oxymetazoline (AFRIN) 0.05 % nasal spray Place 1 spray into both nostrils as needed for congestion.    . pravastatin (PRAVACHOL) 20 MG tablet Take 20 mg by mouth every evening.   99  . promethazine (PHENERGAN) 25 MG tablet Take 25 mg by mouth at bedtime as needed for nausea or vomiting.     . pseudoephedrine (SUDAFED) 120 MG 12 hr tablet Take 120 mg by mouth at bedtime as needed for congestion.    [name] rOPINIRole (REQUIP) 0.5 MG tablet Take 2 mg by mouth daily. Takes 0.5 mg with nap as needed    . topiramate (TOPAMAX) 25 MG tablet 1 in the am and 2 in the pm 90 tablet 6  . traMADol (ULTRAM) 50 MG tablet Take 2 tablets (100 mg total) by mouth every 8 (eight) hours as needed for pain. 180 tablet 5  . venlafaxine XR (EFFEXOR-XR) 150 MG 24 hr capsule TAKE TWO (2) CAPSULES BY MOUTH AT BEDTIME. 60 capsule 2   No facility-administered medications prior to visit.     PAST MEDICAL HISTORY: Past Medical History:  Diagnosis Date  . Anemia   . Anxiety   . Bipolar disorder ([place])   . Bulging lumbar disc    L3-4  . Chronic daily headache   . Chronic low back pain 09/20/2014  . COPD (chronic obstructive pulmonary disease) ([place])   . Degenerative arthritis   . Depression   . Diabetes mellitus   . Diabetes mellitus, type II ([place])   . DM type 2 with diabetic peripheral neuropathy  ([place]) 09/20/2014  . Dyslipidemia   . Dyspnea   . Gastroparesis   . GERD (gastroesophageal reflux disease)   . Heart murmur   . History of hiatal hernia   . Hypertension   . Hypothyroidism   . IBS (irritable bowel syndrome)   . Memory difficulty 09/20/2014  . Morbid obesity ([place])   . Neuropathy   . Obstructive sleep apnea on CPAP   . Restless legs syndrome (RLS) 09/17/2012    PAST SURGICAL HISTORY: Past Surgical History:  Procedure Laterality Date  . ABDOMINAL HYSTERECTOMY    . ARTHROSCOPY KNEE W/ DRILLING  06/2011   and Decemer of 2013.  [name] Carpel tunnel  1980's  . CHOLECYSTECTOMY    . COLONOSCOPY WITH PROPOFOL N/A  10/04/2016   Procedure: COLONOSCOPY WITH PROPOFOL;  Surgeon: Rogene Houston, MD;  Location: [facility];  Service: Endoscopy;  Laterality: N/A;  11:10  . ESOPHAGOGASTRODUODENOSCOPY N/A 04/25/2016   Procedure: ESOPHAGOGASTRODUODENOSCOPY (EGD);  Surgeon: Rogene Houston, MD;  Location: [facility];  Service: Endoscopy;  Laterality: N/A;  730  . RECTAL SURGERY     fissure  . SHOULDER SURGERY Left    arthroscopy in March of this year    FAMILY HISTORY: Family History  Problem Relation Age of Onset  . Hypertension Mother   . Lymphoma Mother   . Depression Mother   . Arthritis Father   . Alcohol abuse Father   . Hypertension Sister   . Cancer Brother        kidney and lung  . Alcohol abuse Brother   . Alcohol abuse Paternal Uncle   . Alcohol abuse Paternal Grandfather   . Alcohol abuse Paternal Grandmother     SOCIAL HISTORY: Social History   Social History  . Marital status: Single    Spouse name: N/A  . Number of children: 0  . Years of education: 57   Occupational History  .  [employer]   Social History Main Topics  . Smoking status: Former Smoker    Types: Cigarettes    Quit date: 02/04/1971  . Smokeless tobacco: Never Used     Comment: smoked 2 cigarettes a day  . Alcohol use No  . Drug use: No  . Sexual activity: No   Other  Topics Concern  . Not on file   Social History Narrative   Patient lives at home alone.    Patient has no children.    Patient has her masters in nursing.    Patient is single.    Patient drinks about 2 glasses of tea daily.   Patient is right handed.     PHYSICAL EXAM  There were no vitals filed for this visit. There is no height or weight on file to calculate BMI.  Generalized: Well developed, Morbidly obese in no acute distress  Head: normocephalic and atraumatic,. Oropharynx benign  Neck: Supple, no carotid bruits  Musculoskeletal: No deformity   Neurological examination   Mentation: Alert oriented to time, place, history taking. Attention span and concentration appropriate. Recent and remote memory intact.  Follows all commands speech and language fluent.   Cranial nerve II-XII: .Pupils were equal round reactive to light extraocular movements were full, visual field were full on confrontational test. Facial sensation and strength were normal. hearing was intact to finger rubbing bilaterally. Uvula tongue midline. head turning and shoulder shrug were normal and symmetric.Tongue protrusion into cheek strength was normal. Motor: normal bulk and tone, full strength in the BUE, BLE, fine finger movements normal, no pronator drift. No focal weakness Sensory: normal and symmetric to light touon the face arms and legs Coordination  finger-nose-finger, heel-to-shin bilaterally, no dysmetria, no significant tremor seen with handwriting or spiral  Reflexes: Depressed upper and lower , plantar responses were flexor bilaterally. Gait and Station: Rising up from seated position without assistance, normal stance,  moderate stride, good arm swing, smooth turning, able to perform tiptoe, and heel walking without difficulty. Tandem gait is steady, no assistive device  DIAGNOSTIC DATA (LABS, IMAGING, TESTING) - I reviewed patient records, labs, notes, testing and imaging myself where  available.  Lab Results  Component Value Date   WBC 13.2 (H) 10/02/2016   HGB 10.5 (L) 10/02/2016   HCT  34.8 (L) 10/02/2016   MCV 79.1 10/02/2016   PLT 468 (H) 10/02/2016      Component Value Date/Time   NA 138 10/24/2016 1431   K 4.3 10/24/2016 1431   CL 103 10/24/2016 1431   CO2 26 10/24/2016 1431   GLUCOSE 129 (H) 10/24/2016 1431   BUN 12 10/24/2016 1431   CREATININE 0.92 10/24/2016 1431   CALCIUM 9.2 10/24/2016 1431   PROT 6.6 10/24/2016 1431   ALBUMIN 3.7 10/24/2016 1431   AST 11 10/24/2016 1431   ALT 11 10/24/2016 1431   ALKPHOS 58 10/24/2016 1431   BILITOT 0.2 10/24/2016 1431   GFRNONAA >60 10/02/2016 0733   GFRAA >60 10/02/2016 0733    Lab Results  Component Value Date   HGBA1C 6.2 (H) 10/24/2016    Lab Results  Component Value Date   TSH 2.13 07/10/2016      ASSESSMENT AND PLAN  66 y.o. year old female  has a past medical history of ; Bipolar disorder ([place]); Essential tremor, sleep apnea on CPAP, restless leg syndrome. Excessive daytime drowsiness and memory disturbance are improved with being off clonazepam.   Continue Topamax for tremor, increase to 75 mg total dose Increase water intake blood pressure in the office today 113/65 with complaints of dizziness Continue Lyrica for peripheral neuropathy, restless legs Try to exercise for overhealth and well being She was also given information about dehydration and the importance of having adequate fluid intake  F/U in 6 months  I spent 25 minutes in total face to face time with the patient more than 50% of which was spent counseling and coordination of care, reviewing test results reviewing medications and discussing and reviewing the diagnosis of essential tremor, dizziness due to  dehydration , Dennie Bible, [facility], [facility], APRN  [facility] [address], [place], [place] [address] [phone]

## 2016-12-23 ENCOUNTER — Ambulatory Visit: Payer: PPO | Admitting: Nurse Practitioner

## 2016-12-24 ENCOUNTER — Encounter: Payer: Self-pay | Admitting: Nurse Practitioner

## 2016-12-26 ENCOUNTER — Ambulatory Visit: Payer: Self-pay | Admitting: Cardiology

## 2017-01-01 DIAGNOSIS — H524 Presbyopia: Secondary | ICD-10-CM | POA: Diagnosis not present

## 2017-01-01 DIAGNOSIS — H35361 Drusen (degenerative) of macula, right eye: Secondary | ICD-10-CM | POA: Diagnosis not present

## 2017-01-02 DIAGNOSIS — E1142 Type 2 diabetes mellitus with diabetic polyneuropathy: Secondary | ICD-10-CM | POA: Diagnosis not present

## 2017-01-02 DIAGNOSIS — L84 Corns and callosities: Secondary | ICD-10-CM | POA: Diagnosis not present

## 2017-01-02 DIAGNOSIS — B351 Tinea unguium: Secondary | ICD-10-CM | POA: Diagnosis not present

## 2017-01-02 DIAGNOSIS — M79676 Pain in unspecified toe(s): Secondary | ICD-10-CM | POA: Diagnosis not present

## 2017-01-07 ENCOUNTER — Ambulatory Visit (INDEPENDENT_AMBULATORY_CARE_PROVIDER_SITE_OTHER): Payer: PPO | Admitting: Internal Medicine

## 2017-01-07 ENCOUNTER — Encounter (INDEPENDENT_AMBULATORY_CARE_PROVIDER_SITE_OTHER): Payer: Self-pay | Admitting: Internal Medicine

## 2017-01-07 VITALS — BP 112/68 | HR 70 | Temp 97.4°F | Resp 18 | Ht 64.0 in | Wt 250.1 lb

## 2017-01-07 DIAGNOSIS — K219 Gastro-esophageal reflux disease without esophagitis: Secondary | ICD-10-CM | POA: Diagnosis not present

## 2017-01-07 DIAGNOSIS — R0789 Other chest pain: Secondary | ICD-10-CM

## 2017-01-07 DIAGNOSIS — D509 Iron deficiency anemia, unspecified: Secondary | ICD-10-CM | POA: Diagnosis not present

## 2017-01-07 NOTE — Progress Notes (Addendum)
Presenting complaint;  Follow-up for iron deficiency anemia GERD and atypical chest pain.  Database and Subjective:  Patient is 66 year old Caucasian female who has chronic GERD. She underwent EGD in January 2018 because of atypical chest pain. She had small hiatal hernia and gastritis without H. pylori. She was found to have iron deficiency anemia and underwent colonoscopy in June 2018 and was normal. She was begun on iron. She denies melena or rectal bleeding. Overall she feels better. She has gained 7 pounds since her last visit. She states she is change her diet completely and eats supper at least 4 hour before she goes to bed. She also denies abdominal pain. She has not had her H&H repeated since her colonoscopy.    Current Medications: Outpatient Encounter Prescriptions as of 01/07/2017  Medication Sig  . albuterol (PROVENTIL HFA;VENTOLIN HFA) 108 (90 Base) MCG/ACT inhaler Inhale 2 puffs into the lungs every 6 (six) hours as needed for wheezing or shortness of breath.  . Aspirin-Acetaminophen-Caffeine (EXCEDRIN MIGRAINE PO) Take 2 tablets by mouth 3 (three) times daily as needed (pain). Take with tramadol  . Biotin 10000 MCG TABS Take 10,000 mcg by mouth at bedtime.  [name] buPROPion (WELLBUTRIN XL) 300 MG 24 hr tablet TAKE ONE TABLET BY MOUTH EVERY MORNING.  . diclofenac sodium (VOLTAREN) 1 % GEL Apply 1 application topically as needed (pain).   [name] esomeprazole (NEXIUM) 40 MG capsule Take 40 mg by mouth 2 (two) times daily.  [name] gabapentin (NEURONTIN) 300 MG capsule Take 1,200 mg by mouth 2 (two) times daily.  . Insulin Glargine (TOUJEO SOLOSTAR) 300 UNIT/ML SOPN Inject 20 Units into the skin at bedtime.  . INVOKANA 100 MG TABS tablet TAKE ONE TABLET BY MOUTH DAILY BEFORE BREAKFAST  . lamoTRIgine (LAMICTAL) 150 MG tablet TAKE ONE TABLET BY MOUTH AT BEDTIME.  [name] levothyroxine (SYNTHROID, LEVOTHROID) 100 MCG tablet Take 100 mcg by mouth daily before breakfast.   . LITE TOUCH PEN NEEDLES 31G X 5  MM MISC USE ONE DAILY.  [name] losartan (COZAAR) 50 MG tablet Take 50 mg by mouth at bedtime.   . meclizine (ANTIVERT) 25 MG tablet Take 1 tablet (25 mg total) by mouth 3 (three) times daily as needed for dizziness. (Patient taking differently: Take 25 mg by mouth daily as needed for dizziness. )  . metFORMIN (GLUCOPHAGE) 500 MG tablet Take 2 tablets (1,000 mg total) by mouth 2 (two) times daily with a meal.  . oxymetazoline (AFRIN) 0.05 % nasal spray Place 1 spray into both nostrils as needed for congestion.  . pravastatin (PRAVACHOL) 20 MG tablet Take 20 mg by mouth every evening.   . promethazine (PHENERGAN) 25 MG tablet Take 25 mg by mouth at bedtime as needed for nausea or vomiting.   . pseudoephedrine (SUDAFED) 120 MG 12 hr tablet Take 120 mg by mouth at bedtime as needed for congestion.  [name] rOPINIRole (REQUIP) 0.5 MG tablet Take 2 mg by mouth 2 (two) times daily. Takes 0.5 mg with nap as needed  . topiramate (TOPAMAX) 25 MG tablet 1 in the am and 2 in the pm  . traMADol (ULTRAM) 50 MG tablet Take 2 tablets (100 mg total) by mouth every 8 (eight) hours as needed for pain.  [name] venlafaxine XR (EFFEXOR-XR) 150 MG 24 hr capsule TAKE TWO (2) CAPSULES BY MOUTH AT BEDTIME.  [name] EPINEPHrine (EPIPEN IJ) Inject 1 Dose as directed as needed (allergic reaction).   . ferrous sulfate 325 (65 FE) MG EC tablet Take 325 mg  by mouth 2 (two) times daily.  . [DISCONTINUED] fexofenadine (ALLEGRA) 180 MG tablet Take 180 mg by mouth daily.  . [DISCONTINUED] omeprazole (PRILOSEC) 40 MG capsule Take 40 mg by mouth 2 (two) times daily.   No facility-administered encounter medications on file as of 01/07/2017.      Objective: Blood pressure 112/68, pulse 70, temperature (!) 97.4 F (36.3 C), temperature source Oral, resp. rate 18, height 5\' 4"  (1.626 m), weight 250 lb 2 oz (113.5 kg). Patient is alert and in no acute distress. Conjunctiva is pink. Sclera is nonicteric Oropharyngeal mucosa is normal. No neck masses or  thyromegaly noted. Cardiac exam with regular rhythm normal S1 and S2. No murmur or gallop noted. Lungs are clear to auscultation. Abdomen is full. She has low midline scar. She has superficial skin ulcers below the level of umbilicus one in each site. These are dry and appeared to be healing. She has skin erythema along the crease. Abdomen is soft and nontender without organomegaly or masses.  No LE edema or clubbing noted. Soft subcutaneous lump noted next to left lateral malleolus but the size of golf ball felt to be lipoma.  Labs/studies Results: H&H was 10.5 and 34.8 with MCV of 79.1 on 10/02/2016.    Assessment:  #1.History of iron deficiency anemia. She had colonoscopy in June 2018 without any abnormality. She had EGD in January 2018(chest pain). She had small sliding hiatal hernia and gastritis without H. pylori. It was felt carotid deficiency anemia was due to impaired aren't option because of chronic PPI therapy.  #2. Chronic GERD. Heartburn well controlled with therapy.  #3. Atypical chest pain. She has responded to anti-reflex therapy although she did not have evidence of erosive esophagitis on EGD of January 2018.   Plan:  Continue PPI double dose. Patient advised to take second dose before evening meal rather than at bedtime. She'll go to lab for CBC.. Office visit in one year.

## 2017-01-07 NOTE — Patient Instructions (Signed)
Physician will call with results of blood test when completed. 

## 2017-01-09 DIAGNOSIS — H25813 Combined forms of age-related cataract, bilateral: Secondary | ICD-10-CM | POA: Diagnosis not present

## 2017-01-21 ENCOUNTER — Other Ambulatory Visit: Payer: Self-pay

## 2017-01-21 DIAGNOSIS — J449 Chronic obstructive pulmonary disease, unspecified: Secondary | ICD-10-CM | POA: Diagnosis not present

## 2017-01-21 DIAGNOSIS — K219 Gastro-esophageal reflux disease without esophagitis: Secondary | ICD-10-CM | POA: Diagnosis not present

## 2017-01-21 DIAGNOSIS — Z23 Encounter for immunization: Secondary | ICD-10-CM | POA: Diagnosis not present

## 2017-01-21 DIAGNOSIS — D509 Iron deficiency anemia, unspecified: Secondary | ICD-10-CM | POA: Diagnosis not present

## 2017-01-21 DIAGNOSIS — E119 Type 2 diabetes mellitus without complications: Secondary | ICD-10-CM | POA: Diagnosis not present

## 2017-01-21 DIAGNOSIS — F319 Bipolar disorder, unspecified: Secondary | ICD-10-CM | POA: Diagnosis not present

## 2017-01-21 LAB — CBC
HCT: 38.6 % (ref 35.0–45.0)
HEMOGLOBIN: 12.6 g/dL (ref 11.7–15.5)
MCH: 28.3 pg (ref 27.0–33.0)
MCHC: 32.6 g/dL (ref 32.0–36.0)
MCV: 86.5 fL (ref 80.0–100.0)
MPV: 10.4 fL (ref 7.5–12.5)
PLATELETS: 377 10*3/uL (ref 140–400)
RBC: 4.46 10*6/uL (ref 3.80–5.10)
RDW: 13.6 % (ref 11.0–15.0)
WBC: 12 10*3/uL — AB (ref 3.8–10.8)

## 2017-01-22 ENCOUNTER — Other Ambulatory Visit: Payer: Self-pay

## 2017-01-22 MED ORDER — CANAGLIFLOZIN 100 MG PO TABS: ORAL_TABLET | ORAL | 0 refills | Status: DC

## 2017-02-08 ENCOUNTER — Other Ambulatory Visit: Payer: Self-pay | Admitting: Nurse Practitioner

## 2017-02-14 NOTE — Patient Instructions (Signed)
Your procedure is scheduled on: 02/28/2017   Report to [facility] at   92  AM.  Call this number if you have problems the morning of surgery: [phone]   Do not eat food or drink liquids :After Midnight.      Take these medicines the morning of surgery with A SIP OF WATER: wellbutrin, neurontin, levothyroxine, losartan, antivert, requip, topamax, ultram. Use your inhaler before you come. See enclosed information for your diabetes information.   Do not wear jewelry, make-up or nail polish.  Do not wear lotions, powders, or perfumes. You may wear deodorant.  Do not shave 48 hours prior to surgery.  Do not bring valuables to the hospital.  Contacts, dentures or bridgework may not be worn into surgery.  Leave suitcase in the car. After surgery it may be brought to your room.  For patients admitted to the hospital, checkout time is 11:00 AM the day of discharge.   Patients discharged the day of surgery will not be allowed to drive home.  :     Please read over the following fact sheets that you were given: Coughing and Deep Breathing, Surgical Site Infection Prevention, Anesthesia Post-op Instructions and Care and Recovery After Surgery    Cataract A cataract is a clouding of the lens of the eye. When a lens becomes cloudy, vision is reduced based on the degree and nature of the clouding. Many cataracts reduce vision to some degree. Some cataracts make people more near-sighted as they develop. Other cataracts increase glare. Cataracts that are ignored and become worse can sometimes look white. The white color can be seen through the pupil. CAUSES   Aging. However, cataracts may occur at any age, even in newborns.   Certain drugs.   Trauma to the eye.   Certain diseases such as diabetes.   Specific eye diseases such as chronic inflammation inside the eye or a sudden attack of a rare form of glaucoma.   Inherited or acquired medical problems.  SYMPTOMS   Gradual, progressive drop in  vision in the affected eye.   Severe, rapid visual loss. This most often happens when trauma is the cause.  DIAGNOSIS  To detect a cataract, an eye doctor examines the lens. Cataracts are best diagnosed with an exam of the eyes with the pupils enlarged (dilated) by drops.  TREATMENT  For an early cataract, vision may improve by using different eyeglasses or stronger lighting. If that does not help your vision, surgery is the only effective treatment. A cataract needs to be surgically removed when vision loss interferes with your everyday activities, such as driving, reading, or watching TV. A cataract may also have to be removed if it prevents examination or treatment of another eye problem. Surgery removes the cloudy lens and usually replaces it with a substitute lens (intraocular lens, IOL).  At a time when both you and your doctor agree, the cataract will be surgically removed. If you have cataracts in both eyes, only one is usually removed at a time. This allows the operated eye to heal and be out of danger from any possible problems after surgery (such as infection or poor wound healing). In rare cases, a cataract may be doing damage to your eye. In these cases, your caregiver may advise surgical removal right away. The vast majority of people who have cataract surgery have better vision afterward. HOME CARE INSTRUCTIONS  If you are not planning surgery, you may be asked to do the following:  Use different eyeglasses.   Use stronger or brighter lighting.   Ask your eye doctor about reducing your medicine dose or changing medicines if it is thought that a medicine caused your cataract. Changing medicines does not make the cataract go away on its own.   Become familiar with your surroundings. Poor vision can lead to injury. Avoid bumping into things on the affected side. You are at a higher risk for tripping or falling.   Exercise extreme care when driving or operating machinery.   Wear  sunglasses if you are sensitive to bright light or experiencing problems with glare.  SEEK IMMEDIATE MEDICAL CARE IF:   You have a worsening or sudden vision loss.   You notice redness, swelling, or increasing pain in the eye.   You have a fever.  Document Released: 04/01/2005 Document Revised: 03/21/2011 Document Reviewed: 11/23/2010 [facility] Patient Information 2012 [employer].PATIENT INSTRUCTIONS POST-ANESTHESIA  IMMEDIATELY FOLLOWING SURGERY:  Do not drive or operate machinery for the first twenty four hours after surgery.  Do not make any important decisions for twenty four hours after surgery or while taking narcotic pain medications or sedatives.  If you develop intractable nausea and vomiting or a severe headache please notify your doctor immediately.  FOLLOW-UP:  Please make an appointment with your surgeon as instructed. You do not need to follow up with anesthesia unless specifically instructed to do so.  WOUND CARE INSTRUCTIONS (if applicable):  Keep a dry clean dressing on the anesthesia/puncture wound site if there is drainage.  Once the wound has quit draining you may leave it open to air.  Generally you should leave the bandage intact for twenty four hours unless there is drainage.  If the epidural site drains for more than 36-48 hours please call the anesthesia department.  QUESTIONS?:  Please feel free to call your physician or the hospital operator if you have any questions, and they will be happy to assist you.

## 2017-02-21 ENCOUNTER — Encounter (HOSPITAL_COMMUNITY)
Admission: RE | Admit: 2017-02-21 | Discharge: 2017-02-21 | Disposition: A | Discharge status: Home / Self Care | Payer: PPO | Source: Ambulatory Visit | Attending: Ophthalmology | Admitting: Ophthalmology

## 2017-02-21 ENCOUNTER — Other Ambulatory Visit (HOSPITAL_COMMUNITY): Payer: Self-pay

## 2017-02-24 ENCOUNTER — Other Ambulatory Visit: Payer: Self-pay

## 2017-02-24 ENCOUNTER — Ambulatory Visit (INDEPENDENT_AMBULATORY_CARE_PROVIDER_SITE_OTHER): Payer: PPO | Admitting: Neurology

## 2017-02-24 ENCOUNTER — Encounter: Payer: Self-pay | Admitting: Neurology

## 2017-02-24 VITALS — BP 150/70 | HR 99 | Ht 64.0 in | Wt 252.5 lb

## 2017-02-24 DIAGNOSIS — R251 Tremor, unspecified: Secondary | ICD-10-CM | POA: Diagnosis not present

## 2017-02-24 NOTE — Patient Instructions (Signed)
   With the Topamax, reduce the medication by one tablet a week until off of the medication.

## 2017-02-24 NOTE — Progress Notes (Signed)
Reason for visit: Tremor  Norma [name] is an 66 y.o. female  History of present illness:  Norma [name] is a 66 year old right-handed white female with a history of an essential tremor that is relatively mild.  The patient is on Topamax for this.  The patient has a peripheral neuropathy and some discomfort with this, she is on 1200 mg of gabapentin twice a day.  The patient is on several antidepressant medications for bipolar disorder.  She has COPD on CPAP, but she has not been on CPAP for several months as her machine is in need of repair.  The patient claims that the tremor is not bothersome every day, it is an intermittent problem for her.  She does have restless leg syndrome and she has some jerks and twitches that may occur on the arms and legs at times.  The patient returns to this office for an evaluation.  Past Medical History:  Diagnosis Date  . Anemia   . Anxiety   . Bipolar disorder ([place])   . Bulging lumbar disc    L3-4  . Chronic daily headache   . Chronic low back pain 09/20/2014  . COPD (chronic obstructive pulmonary disease) ([place])   . Degenerative arthritis   . Depression   . Diabetes mellitus   . Diabetes mellitus, type II ([place])   . DM type 2 with diabetic peripheral neuropathy ([place]) 09/20/2014  . Dyslipidemia   . Dyspnea   . Gastroparesis   . GERD (gastroesophageal reflux disease)   . Heart murmur   . History of hiatal hernia   . Hypertension   . Hypothyroidism   . IBS (irritable bowel syndrome)   . Memory difficulty 09/20/2014  . Morbid obesity ([place])   . Neuropathy   . Obstructive sleep apnea on CPAP   . Restless legs syndrome (RLS) 09/17/2012    Past Surgical History:  Procedure Laterality Date  . ABDOMINAL HYSTERECTOMY    . ARTHROSCOPY KNEE W/ DRILLING  06/2011   and Decemer of 2013.  [name] Carpel tunnel  1980's  . CHOLECYSTECTOMY    . RECTAL SURGERY     fissure  . SHOULDER SURGERY Left    arthroscopy in March of this year    Family History  Problem  Relation Age of Onset  . Hypertension Mother   . Lymphoma Mother   . Depression Mother   . Arthritis Father   . Alcohol abuse Father   . Hypertension Sister   . Cancer Brother        kidney and lung  . Alcohol abuse Brother   . Alcohol abuse Paternal Uncle   . Alcohol abuse Paternal Grandfather   . Alcohol abuse Paternal Grandmother     Social history:  reports that she quit smoking about 46 years ago. Her smoking use included cigarettes. she has never used smokeless tobacco. She reports that she does not drink alcohol or use drugs.    Allergies  Allergen Reactions  . Amoxicillin Anaphylaxis and Other (See Comments)    Has patient had a PCN reaction causing immediate rash, facial/tongue/throat swelling, SOB or lightheadedness with hypotension: Yes Has patient had a PCN reaction causing severe rash involving mucus membranes or skin necrosis: Yes Has patient had a PCN reaction that required hospitalization: Yes Has patient had a PCN reaction occurring within the last 10 years: Yes If all of the above answers are "NO", then may proceed with Cephalosporin use.   [name] Hydrocodone Anaphylaxis  . Hydrocodone-Acetaminophen  Other (See Comments)  . Depacon [Valproic Acid] Other (See Comments)    Causes falls     Medications:  Prior to Admission medications   Medication Sig Start Date End Date Taking? Authorizing Provider  albuterol (PROVENTIL HFA;VENTOLIN HFA) 108 (90 Base) MCG/ACT inhaler Inhale 2 puffs into the lungs every 6 (six) hours as needed for wheezing or shortness of breath.   Yes [provider]  Aspirin-Acetaminophen-Caffeine (EXCEDRIN MIGRAINE PO) Take 2 tablets by mouth daily as needed (pain). Take with tramadol   Yes [provider]  buPROPion (WELLBUTRIN XL) 300 MG 24 hr tablet TAKE ONE TABLET BY MOUTH EVERY MORNING. 10/18/15  Yes Cloria Spring, MD  canagliflozin (INVOKANA) 100 MG TABS tablet TAKE ONE TABLET BY MOUTH DAILY BEFORE BREAKFAST Patient taking  differently: Take 100 mg by mouth daily before breakfast.  01/22/17  Yes Nida, Marella Chimes, MD  diclofenac sodium (VOLTAREN) 1 % GEL Apply 1 application topically as needed (pain).    Yes [provider]  EPINEPHrine (EPIPEN IJ) Inject 1 Dose as directed as needed (allergic reaction).    Yes [provider]  esomeprazole (NEXIUM) 40 MG capsule Take 40 mg by mouth 2 (two) times daily. 01/28/17  Yes [provider]  gabapentin (NEURONTIN) 300 MG capsule Take 1,200 mg by mouth 2 (two) times daily.   Yes [provider]  Insulin Glargine (TOUJEO SOLOSTAR) 300 UNIT/ML SOPN Inject 20 Units into the skin at bedtime.   Yes [provider]  lamoTRIgine (LAMICTAL) 150 MG tablet TAKE ONE TABLET BY MOUTH AT BEDTIME. 10/18/15  Yes Cloria Spring, MD  levothyroxine (SYNTHROID, LEVOTHROID) 100 MCG tablet Take 100 mcg by mouth daily before breakfast.    Yes [provider]  LITE TOUCH PEN NEEDLES 31G X 5 MM MISC USE ONE DAILY. 02/05/16  Yes Nida, Marella Chimes, MD  loratadine-pseudoephedrine (CLARITIN-D 24-HOUR) 10-240 MG 24 hr tablet Take 1 tablet by mouth at bedtime.   Yes [provider]  losartan (COZAAR) 50 MG tablet Take 50 mg by mouth at bedtime.    Yes [provider]  meclizine (ANTIVERT) 25 MG tablet Take 1 tablet (25 mg total) by mouth 3 (three) times daily as needed for dizziness. Patient taking differently: Take 25 mg by mouth daily as needed for dizziness.  03/29/14  Yes Virgel Manifold, MD  metFORMIN (GLUCOPHAGE) 500 MG tablet Take 2 tablets (1,000 mg total) by mouth 2 (two) times daily with a meal. 02/15/15  Yes Nida, Marella Chimes, MD  oxymetazoline (AFRIN) 0.05 % nasal spray Place 1 spray into both nostrils as needed for congestion.   Yes [provider]  pravastatin (PRAVACHOL) 20 MG tablet Take 20 mg by mouth every evening.  03/08/14  Yes [provider]  promethazine (PHENERGAN) 25 MG tablet Take 25 mg  by mouth at bedtime as needed for nausea or vomiting.    Yes [provider]  rOPINIRole (REQUIP) 2 MG tablet Take 2 mg by mouth 2 (two) times daily.   Yes [provider]  topiramate (TOPAMAX) 25 MG tablet TAKE ONE TABLET BY MOUTH EVERY MORNING AND TWO TABLETS EVERY EVENING. 02/10/17  Yes Kathrynn Ducking, MD  traMADol (ULTRAM) 50 MG tablet Take 2 tablets (100 mg total) by mouth every 8 (eight) hours as needed for pain. Patient taking differently: Take 100 mg by mouth every 6 (six) hours as needed for moderate pain.  12/25/12  Yes Kirsteins, Luanna Salk, MD  venlafaxine XR (EFFEXOR-XR) 150 MG  24 hr capsule TAKE TWO (2) CAPSULES BY MOUTH AT BEDTIME. 10/18/15  Yes Cloria Spring, MD    ROS:  Out of a complete 14 system review of symptoms, the patient complains only of the following symptoms, and all other reviewed systems are negative.  Fatigue Hearing loss Restless legs, sleep apnea Joint pain, aching muscles, walking difficulty Tremor Anemia  Blood pressure (!) 150/70, pulse 99, height 5\' 4"  (1.626 m), weight 252 lb 8 oz (114.5 kg), SpO2 98 %.  Physical Exam  General: The patient is alert and cooperative at the time of the examination.  Patient is moderately to markedly obese.  Skin: 1-2+ edema is noted below the knees bilaterally.   Neurologic Exam  Mental status: The patient is alert and oriented x 3 at the time of the examination. The patient has apparent normal recent and remote memory, with an apparently normal attention span and concentration ability.   Cranial nerves: Facial symmetry is present. Speech is normal, no aphasia or dysarthria is noted. Extraocular movements are full. Visual fields are full.  Motor: The patient has good strength in all 4 extremities.  Sensory examination: Soft touch sensation is symmetric on the face, arms, and legs.  Coordination: The patient has good finger-nose-finger and heel-to-shin bilaterally.  When drawing a spiral, no  tremor seen translated into the handwriting.  Gait and station: The patient has a normal gait. Tandem gait is slightly unsteady. Romberg is negative. No drift is seen.  Reflexes: Deep tendon reflexes are symmetric.   Assessment/Plan:  1.  Essential tremor  2.  Restless leg syndrome  3.  Reports of cognitive slowing  The patient may be having side effects from medication with her cognitive functioning.  The patient is on several psychoactive medications.  The Topamax and gabapentin may be affecting her ability to think and concentrate, and she has not been on CPAP recently.  The patient will be tapered off of the Topamax by 1 tablet each week until the medication is stopped.  The patient may require a reduction in the gabapentin dose as well.  She will follow-up in about 4 months.  Jill Alexanders MD 02/24/2017 12:18 PM  [facility] [address] [place], [place] [address]  Phone [phone] Fax [phone]

## 2017-02-25 ENCOUNTER — Encounter (HOSPITAL_COMMUNITY): Admission: RE | Admit: 2017-02-25 | Payer: PPO | Source: Ambulatory Visit

## 2017-02-25 ENCOUNTER — Encounter (HOSPITAL_COMMUNITY): Payer: Self-pay

## 2017-03-12 NOTE — Patient Instructions (Addendum)
Your procedure is scheduled on: 03/21/2017  Report to [facility] at  750  AM.  Call this number if you have problems the morning of surgery: [phone]   Do not eat food or drink liquids :After Midnight.      Take these medicines the morning of surgery with A SIP OF WATER: Wellbutrin, nexium, neurontin, levothyroxine, claritin, losartan, antivert, requip, ultram. Use your inhaler before you come and bring your rescue inhaler with you. Take 10 units of Toujeo the night before your procedure. DO NOT take any medications for diabetes the morning of your surgery.    Do not wear jewelry, make-up or nail polish.  Do not wear lotions, powders, or perfumes. You may wear deodorant.  Do not shave 48 hours prior to surgery.  Do not bring valuables to the hospital.  Contacts, dentures or bridgework may not be worn into surgery.  Leave suitcase in the car. After surgery it may be brought to your room.  For patients admitted to the hospital, checkout time is 11:00 AM the day of discharge.   Patients discharged the day of surgery will not be allowed to drive home.  :     Please read over the following fact sheets that you were given: Coughing and Deep Breathing, Surgical Site Infection Prevention, Anesthesia Post-op Instructions and Care and Recovery After Surgery    Cataract A cataract is a clouding of the lens of the eye. When a lens becomes cloudy, vision is reduced based on the degree and nature of the clouding. Many cataracts reduce vision to some degree. Some cataracts make people more near-sighted as they develop. Other cataracts increase glare. Cataracts that are ignored and become worse can sometimes look white. The white color can be seen through the pupil. CAUSES   Aging. However, cataracts may occur at any age, even in newborns.   Certain drugs.   Trauma to the eye.   Certain diseases such as diabetes.   Specific eye diseases such as chronic inflammation inside the eye or a sudden  attack of a rare form of glaucoma.   Inherited or acquired medical problems.  SYMPTOMS   Gradual, progressive drop in vision in the affected eye.   Severe, rapid visual loss. This most often happens when trauma is the cause.  DIAGNOSIS  To detect a cataract, an eye doctor examines the lens. Cataracts are best diagnosed with an exam of the eyes with the pupils enlarged (dilated) by drops.  TREATMENT  For an early cataract, vision may improve by using different eyeglasses or stronger lighting. If that does not help your vision, surgery is the only effective treatment. A cataract needs to be surgically removed when vision loss interferes with your everyday activities, such as driving, reading, or watching TV. A cataract may also have to be removed if it prevents examination or treatment of another eye problem. Surgery removes the cloudy lens and usually replaces it with a substitute lens (intraocular lens, IOL).  At a time when both you and your doctor agree, the cataract will be surgically removed. If you have cataracts in both eyes, only one is usually removed at a time. This allows the operated eye to heal and be out of danger from any possible problems after surgery (such as infection or poor wound healing). In rare cases, a cataract may be doing damage to your eye. In these cases, your caregiver may advise surgical removal right away. The vast majority of people who have cataract surgery have  better vision afterward. HOME CARE INSTRUCTIONS  If you are not planning surgery, you may be asked to do the following:  Use different eyeglasses.   Use stronger or brighter lighting.   Ask your eye doctor about reducing your medicine dose or changing medicines if it is thought that a medicine caused your cataract. Changing medicines does not make the cataract go away on its own.   Become familiar with your surroundings. Poor vision can lead to injury. Avoid bumping into things on the affected side. You  are at a higher risk for tripping or falling.   Exercise extreme care when driving or operating machinery.   Wear sunglasses if you are sensitive to bright light or experiencing problems with glare.  SEEK IMMEDIATE MEDICAL CARE IF:   You have a worsening or sudden vision loss.   You notice redness, swelling, or increasing pain in the eye.   You have a fever.  Document Released: 04/01/2005 Document Revised: 03/21/2011 Document Reviewed: 11/23/2010 [facility] Patient Information 2012 [place].PATIENT INSTRUCTIONS POST-ANESTHESIA  IMMEDIATELY FOLLOWING SURGERY:  Do not drive or operate machinery for the first twenty four hours after surgery.  Do not make any important decisions for twenty four hours after surgery or while taking narcotic pain medications or sedatives.  If you develop intractable nausea and vomiting or a severe headache please notify your doctor immediately.  FOLLOW-UP:  Please make an appointment with your surgeon as instructed. You do not need to follow up with anesthesia unless specifically instructed to do so.  WOUND CARE INSTRUCTIONS (if applicable):  Keep a dry clean dressing on the anesthesia/puncture wound site if there is drainage.  Once the wound has quit draining you may leave it open to air.  Generally you should leave the bandage intact for twenty four hours unless there is drainage.  If the epidural site drains for more than 36-48 hours please call the anesthesia department.  QUESTIONS?:  Please feel free to call your physician or the hospital operator if you have any questions, and they will be happy to assist you.

## 2017-03-14 ENCOUNTER — Other Ambulatory Visit: Payer: Self-pay

## 2017-03-14 ENCOUNTER — Encounter (HOSPITAL_COMMUNITY)
Admission: RE | Admit: 2017-03-14 | Discharge: 2017-03-14 | Disposition: A | Discharge status: Home / Self Care | Payer: PPO | Source: Ambulatory Visit | Attending: Ophthalmology | Admitting: Ophthalmology

## 2017-03-14 ENCOUNTER — Encounter (HOSPITAL_COMMUNITY): Payer: Self-pay

## 2017-03-14 DIAGNOSIS — Z794 Long term (current) use of insulin: Secondary | ICD-10-CM | POA: Diagnosis not present

## 2017-03-14 DIAGNOSIS — Z79890 Hormone replacement therapy (postmenopausal): Secondary | ICD-10-CM | POA: Diagnosis not present

## 2017-03-14 DIAGNOSIS — Z79899 Other long term (current) drug therapy: Secondary | ICD-10-CM | POA: Diagnosis not present

## 2017-03-14 DIAGNOSIS — H25811 Combined forms of age-related cataract, right eye: Secondary | ICD-10-CM | POA: Diagnosis not present

## 2017-03-14 DIAGNOSIS — Z7982 Long term (current) use of aspirin: Secondary | ICD-10-CM | POA: Diagnosis not present

## 2017-03-14 DIAGNOSIS — D509 Iron deficiency anemia, unspecified: Secondary | ICD-10-CM | POA: Diagnosis not present

## 2017-03-14 LAB — CBC WITH DIFFERENTIAL/PLATELET
BASOS ABS: 0 10*3/uL (ref 0.0–0.1)
Basophils Relative: 0 %
Eosinophils Absolute: 0.3 10*3/uL (ref 0.0–0.7)
Eosinophils Relative: 3 %
HCT: 38.7 % (ref 36.0–46.0)
HEMOGLOBIN: 12.2 g/dL (ref 12.0–15.0)
LYMPHS ABS: 1.4 10*3/uL (ref 0.7–4.0)
LYMPHS PCT: 14 %
MCH: 29 pg (ref 26.0–34.0)
MCHC: 31.5 g/dL (ref 30.0–36.0)
MCV: 92.1 fL (ref 78.0–100.0)
Monocytes Absolute: 0.8 10*3/uL (ref 0.1–1.0)
Monocytes Relative: 8 %
Neutro Abs: 7.5 10*3/uL (ref 1.7–7.7)
Neutrophils Relative %: 75 %
Platelets: 348 10*3/uL (ref 150–400)
RBC: 4.2 MIL/uL (ref 3.87–5.11)
RDW: 14.6 % (ref 11.5–15.5)
WBC: 10 10*3/uL (ref 4.0–10.5)

## 2017-03-14 LAB — BASIC METABOLIC PANEL
ANION GAP: 10 (ref 5–15)
BUN: 19 mg/dL (ref 6–20)
CHLORIDE: 101 mmol/L (ref 101–111)
CO2: 25 mmol/L (ref 22–32)
Calcium: 9 mg/dL (ref 8.9–10.3)
Creatinine, Ser: 0.9 mg/dL (ref 0.44–1.00)
GFR calc Af Amer: >60 mL/min (ref >60)
GLUCOSE: 175 mg/dL — AB (ref 65–99)
POTASSIUM: 3.9 mmol/L (ref 3.5–5.1)
Sodium: 136 mmol/L (ref 135–145)

## 2017-03-14 LAB — HEMOGLOBIN A1C
Hgb A1c MFr Bld: 6.7 % — ABNORMAL HIGH (ref 4.8–5.6)
MEAN PLASMA GLUCOSE: 145.59 mg/dL

## 2017-03-14 LAB — GLUCOSE, CAPILLARY: Glucose-Capillary: 161 mg/dL — ABNORMAL HIGH (ref 65–99)

## 2017-03-21 ENCOUNTER — Ambulatory Visit (HOSPITAL_COMMUNITY): Payer: PPO | Admitting: Anesthesiology

## 2017-03-21 ENCOUNTER — Ambulatory Visit: Payer: PPO | Admitting: "Endocrinology

## 2017-03-21 ENCOUNTER — Encounter (HOSPITAL_COMMUNITY)
Admission: RE | Disposition: A | Discharge status: Home / Self Care | Payer: Self-pay | Source: Ambulatory Visit | Attending: Ophthalmology

## 2017-03-21 ENCOUNTER — Ambulatory Visit (HOSPITAL_COMMUNITY)
Admission: RE | Admit: 2017-03-21 | Discharge: 2017-03-21 | Disposition: A | Discharge status: Home / Self Care | Payer: PPO | Source: Ambulatory Visit | Attending: Ophthalmology | Admitting: Ophthalmology

## 2017-03-21 ENCOUNTER — Encounter (HOSPITAL_COMMUNITY): Payer: Self-pay | Admitting: *Deleted

## 2017-03-21 DIAGNOSIS — J449 Chronic obstructive pulmonary disease, unspecified: Secondary | ICD-10-CM | POA: Diagnosis not present

## 2017-03-21 DIAGNOSIS — G2581 Restless legs syndrome: Secondary | ICD-10-CM | POA: Insufficient documentation

## 2017-03-21 DIAGNOSIS — H52201 Unspecified astigmatism, right eye: Secondary | ICD-10-CM | POA: Insufficient documentation

## 2017-03-21 DIAGNOSIS — K219 Gastro-esophageal reflux disease without esophagitis: Secondary | ICD-10-CM | POA: Insufficient documentation

## 2017-03-21 DIAGNOSIS — H269 Unspecified cataract: Secondary | ICD-10-CM | POA: Insufficient documentation

## 2017-03-21 DIAGNOSIS — G4730 Sleep apnea, unspecified: Secondary | ICD-10-CM | POA: Diagnosis not present

## 2017-03-21 DIAGNOSIS — F419 Anxiety disorder, unspecified: Secondary | ICD-10-CM | POA: Diagnosis not present

## 2017-03-21 DIAGNOSIS — F329 Major depressive disorder, single episode, unspecified: Secondary | ICD-10-CM | POA: Insufficient documentation

## 2017-03-21 DIAGNOSIS — H25811 Combined forms of age-related cataract, right eye: Secondary | ICD-10-CM | POA: Diagnosis not present

## 2017-03-21 DIAGNOSIS — H2511 Age-related nuclear cataract, right eye: Secondary | ICD-10-CM | POA: Diagnosis not present

## 2017-03-21 DIAGNOSIS — Z87891 Personal history of nicotine dependence: Secondary | ICD-10-CM | POA: Diagnosis not present

## 2017-03-21 HISTORY — PX: CATARACT EXTRACTION W/PHACO: SHX586

## 2017-03-21 LAB — GLUCOSE, CAPILLARY: GLUCOSE-CAPILLARY: 141 mg/dL — AB (ref 65–99)

## 2017-03-21 SURGERY — PHACOEMULSIFICATION, CATARACT, WITH IOL INSERTION
Anesthesia: Monitor Anesthesia Care | Site: Eye | Laterality: Right

## 2017-03-21 MED ORDER — BSS IO SOLN
INTRAOCULAR | Status: DC | PRN
Start: 2017-03-21 — End: 2017-03-21
  Administered 2017-03-21: 15 mL via INTRAOCULAR

## 2017-03-21 MED ORDER — PHENYLEPHRINE HCL 2.5 % OP SOLN
1.0000 [drp] | OPHTHALMIC | Status: AC
  Administered 2017-03-21 (×3): 1 [drp] via OPHTHALMIC

## 2017-03-21 MED ORDER — MIDAZOLAM HCL 2 MG/2ML IJ SOLN
INTRAMUSCULAR | Status: AC
  Filled 2017-03-21: qty 2

## 2017-03-21 MED ORDER — POVIDONE-IODINE 5 % OP SOLN
OPHTHALMIC | Status: DC | PRN
Start: 2017-03-21 — End: 2017-03-21
  Administered 2017-03-21: 1 via OPHTHALMIC

## 2017-03-21 MED ORDER — CYCLOPENTOLATE-PHENYLEPHRINE 0.2-1 % OP SOLN
1.0000 [drp] | OPHTHALMIC | Status: AC
  Administered 2017-03-21 (×3): 1 [drp] via OPHTHALMIC

## 2017-03-21 MED ORDER — ONDANSETRON 4 MG PO TBDP
4.0000 mg | ORAL_TABLET | Freq: Once | ORAL | Status: AC
  Administered 2017-03-21: 4 mg via ORAL

## 2017-03-21 MED ORDER — EPINEPHRINE PF 1 MG/ML IJ SOLN
INTRAMUSCULAR | Status: DC | PRN
  Administered 2017-03-21: 500 mL

## 2017-03-21 MED ORDER — PROVISC 10 MG/ML IO SOLN
INTRAOCULAR | Status: DC | PRN
  Administered 2017-03-21: 0.85 mL via INTRAOCULAR

## 2017-03-21 MED ORDER — MIDAZOLAM HCL 2 MG/2ML IJ SOLN
1.0000 mg | Freq: Once | INTRAMUSCULAR | Status: AC | PRN
  Administered 2017-03-21: 2 mg via INTRAVENOUS

## 2017-03-21 MED ORDER — NEOMYCIN-POLYMYXIN-DEXAMETH 3.5-10000-0.1 OP SUSP
OPHTHALMIC | Status: DC | PRN
  Administered 2017-03-21: 2 [drp] via OPHTHALMIC

## 2017-03-21 MED ORDER — ONDANSETRON 4 MG PO TBDP
ORAL_TABLET | ORAL | Status: AC
  Filled 2017-03-21: qty 1

## 2017-03-21 MED ORDER — LIDOCAINE HCL (PF) 1 % IJ SOLN
INTRAOCULAR | Status: DC | PRN
  Administered 2017-03-21: 1 mL via OPHTHALMIC

## 2017-03-21 MED ORDER — TETRACAINE HCL 0.5 % OP SOLN
1.0000 [drp] | OPHTHALMIC | Status: AC
  Administered 2017-03-21 (×3): 1 [drp] via OPHTHALMIC

## 2017-03-21 MED ORDER — LACTATED RINGERS IV SOLN
INTRAVENOUS | Status: DC
  Administered 2017-03-21: 09:00:00 via INTRAVENOUS

## 2017-03-21 MED ORDER — LIDOCAINE HCL 3.5 % OP GEL: 1.0000 "application " | Freq: Once | OPHTHALMIC | Status: DC

## 2017-03-21 MED ORDER — SODIUM HYALURONATE 23 MG/ML IO SOLN
INTRAOCULAR | Status: DC | PRN
  Administered 2017-03-21: 0.6 mL via INTRAOCULAR

## 2017-03-21 SURGICAL SUPPLY — 18 items
CLOTH BEACON ORANGE TIMEOUT ST (SAFETY) ×2 IMPLANT
EYE SHIELD UNIVERSAL CLEAR (GAUZE/BANDAGES/DRESSINGS) ×2 IMPLANT
GLOVE BIOGEL PI IND STRL 6.5 (GLOVE) IMPLANT
GLOVE BIOGEL PI IND STRL 7.0 (GLOVE) IMPLANT
GLOVE BIOGEL PI INDICATOR 6.5 (GLOVE) ×2
GLOVE BIOGEL PI INDICATOR 7.0 (GLOVE) ×2
LENS IOL ACRYSOF IQ TORIC 15.0 ×2 IMPLANT
NDL HYPO 18GX1.5 BLUNT FILL (NEEDLE) IMPLANT
NEEDLE HYPO 18GX1.5 BLUNT FILL (NEEDLE) ×3 IMPLANT
PAD ARMBOARD 7.5X6 YLW CONV (MISCELLANEOUS) ×2 IMPLANT
PROC W SPEC LENS (INTRAOCULAR LENS) ×3
PROCESS W SPEC LENS (INTRAOCULAR LENS) IMPLANT
RING MALYGIN (MISCELLANEOUS) IMPLANT
SYR TB 1ML LL NO SAFETY (SYRINGE) ×2 IMPLANT
TAPE SURG TRANSPORE 1 IN (GAUZE/BANDAGES/DRESSINGS) IMPLANT
TAPE SURGICAL TRANSPORE 1 IN (GAUZE/BANDAGES/DRESSINGS) ×2
VISCOELASTIC ADDITIONAL (OPHTHALMIC RELATED) ×2 IMPLANT
WATER STERILE IRR 250ML POUR (IV SOLUTION) ×2 IMPLANT

## 2017-03-21 NOTE — Anesthesia Procedure Notes (Signed)
Procedure Name: MAC Date/Time: 03/21/2017 9:54 AM Performed by: Andree Elk Anson Peddie A, CRNA Pre-anesthesia Checklist: Patient identified, Timeout performed, Emergency Drugs available, Suction available and Patient being monitored Oxygen Delivery Method: Nasal cannula

## 2017-03-21 NOTE — H&P (Signed)
The H and P was reviewed and updated. The patient was examined.  No changes were found after exam.  The surgical eye was marked.  

## 2017-03-21 NOTE — Anesthesia Postprocedure Evaluation (Signed)
Anesthesia Post Note  Patient: CAMIYAH FRIBERG  Procedure(s) Performed: CATARACT EXTRACTION PHACO AND INTRAOCULAR LENS PLACEMENT RIGHT EYE (Right Eye)  Patient location during evaluation: Short Stay Anesthesia Type: MAC Level of consciousness: awake and alert and oriented Pain management: pain level controlled Vital Signs Assessment: post-procedure vital signs reviewed and stable Respiratory status: spontaneous breathing Cardiovascular status: stable Postop Assessment: no apparent nausea or vomiting Anesthetic complications: no     Last Vitals:  Vitals:   03/21/17 0825 03/21/17 0830  BP: 90/62   Resp: (!) 23 (!) 22  SpO2: 93% 96%    Last Pain:  Vitals:   03/21/17 0806  TempSrc: Oral                 ADAMS, AMY A

## 2017-03-21 NOTE — Transfer of Care (Signed)
Immediate Anesthesia Transfer of Care Note  Patient: Norma [name]  Procedure(s) Performed: CATARACT EXTRACTION PHACO AND INTRAOCULAR LENS PLACEMENT RIGHT EYE (Right Eye)  Patient Location: Short Stay  Anesthesia Type:MAC  Level of Consciousness: awake, alert , oriented and patient cooperative  Airway & Oxygen Therapy: Patient Spontanous Breathing  Post-op Assessment: Report given to RN and Post -op Vital signs reviewed and stable  Post vital signs: Reviewed and stable  Last Vitals:  Vitals:   03/21/17 0825 03/21/17 0830  BP: 90/62   Resp: (!) 23 (!) 22  SpO2: 93% 96%    Last Pain:  Vitals:   03/21/17 0806  TempSrc: Oral      Patients Stated Pain Goal: 5 (73/41/93 7902)  Complications: No apparent anesthesia complications

## 2017-03-21 NOTE — Anesthesia Preprocedure Evaluation (Signed)
Anesthesia Evaluation  Patient identified by MRN, date of birth, ID band Patient awake    Airway Mallampati: I  TM Distance: >3 FB     Dental  (+) Teeth Intact   Pulmonary sleep apnea , COPD, former smoker,    Pulmonary exam normal breath sounds clear to auscultation       Cardiovascular + dysrhythmias (LBBB)  Rhythm:Regular Rate:Normal     Neuro/Psych  Headaches, Anxiety Depression Bipolar Disorder Restless leg syndrome  Neuromuscular disease    GI/Hepatic hiatal hernia, GERD  ,  Endo/Other  diabetesHypothyroidism   Renal/GU      Musculoskeletal   Abdominal   Peds  Hematology   Anesthesia Other Findings   Reproductive/Obstetrics                             Anesthesia Physical Anesthesia Plan  ASA: III  Anesthesia Plan: MAC   Post-op Pain Management:    Induction:   PONV Risk Score and Plan:   Airway Management Planned: Nasal Cannula  Additional Equipment:   Intra-op Plan:   Post-operative Plan:   Informed Consent: I have reviewed the patients History and Physical, chart, labs and discussed the procedure including the risks, benefits and alternatives for the proposed anesthesia with the patient or authorized representative who has indicated his/her understanding and acceptance.     Plan Discussed with: CRNA  Anesthesia Plan Comments:         Anesthesia Quick Evaluation

## 2017-03-21 NOTE — Op Note (Signed)
Date of procedure: 03/21/17  Pre-operative diagnosis: Visually significant cataract, Right Eye; Visually Significant Astigmatism, Right Eye  Post-operative diagnosis: Visually significant cataract, Right Eye; Visually Significant Astigmatism, Right Eye  Procedure: Removal of cataract via phacoemulsification and insertion of intra-ocular lens AMO ZCT150 +18.0D into the capsular bag of the Right Eye  Attending surgeon: Gerda Diss. Jameria Bradway, MD, MA  Anesthesia: MAC, Topical [place]  Complications: None  Estimated Blood Loss: <74m (minimal)  Specimens: None  Implants: As above  Indications:  Visually significant cataract, Right Eye; Visually Significant Astigmatism, Right Eye  Procedure:  The patient was seen and identified in the pre-operative area. The operative eye was identified and dilated.  The operative eye was marked.  Pre-operative toric markers were used to mark the eye at 0 and 180 degrees. Topical anesthesia was administered to the operative eye.     The patient was then to the operative suite and placed in the supine position.  A timeout was performed confirming the patient, procedure to be performed, and all other relevant information.   The patient's face was prepped and draped in the usual fashion for intra-ocular surgery.  A lid speculum was placed into the operative eye and the surgical microscope moved into place and focused.  A superotemporal paracentesis was created using a 20 gauge paracentesis blade.  Shugarcaine was injected into the anterior chamber.  Viscoelastic was injected into the anterior chamber.  A temporal clear-corneal main wound incision was created using a 2.444mmicrokeratome.  A continuous curvilinear capsulorrhexis was initiated using an irrigating cystitome and completed using capsulorrhexis forceps.  Hydrodissection and hydrodeliniation were performed.  Viscoelastic was injected into the anterior chamber.  A phacoemulsification handpiece and a chopper as a  second instrument were used to remove the nucleus and epinucleus. The irrigation/aspiration handpiece was used to remove any remaining cortical material.   The capsular bag was reinflated with viscoelastic, checked, and found to be intact.  The intraocular lens was inserted into the capsular bag and dialed into place using a Kuglen hook to 38 degrees.  The irrigation/aspiration handpiece was used to remove any remaining viscoelastic.  The clear corneal wound and paracentesis wounds were then hydrated and checked with Weck-Cels to be watertight.  The lid-speculum and drape was removed, and the patient's face was cleaned with a wet and dry 4x4.  Maxitrol was instilled in the eye before a clear shield was taped over the eye. The patient was taken to the post-operative care unit in good condition, having tolerated the procedure well.  Post-Op Instructions: [facility] and will receive all other orders and instructions.

## 2017-03-21 NOTE — Discharge Instructions (Signed)
Please discharge patient when stable, will follow up today with Dr. Milderd Manocchio at the [facility] office immediately following discharge.  Leave shield in place until visit.  All paperwork with discharge instructions will be given at the office. ° ° °PATIENT INSTRUCTIONS °POST-ANESTHESIA ° °IMMEDIATELY FOLLOWING SURGERY:  Do not drive or operate machinery for the first twenty four hours after surgery.  Do not make any important decisions for twenty four hours after surgery or while taking narcotic pain medications or sedatives.  If you develop intractable nausea and vomiting or a severe headache please notify your doctor immediately. ° °FOLLOW-UP:  Please make an appointment with your surgeon as instructed. You do not need to follow up with anesthesia unless specifically instructed to do so. ° °WOUND CARE INSTRUCTIONS (if applicable):  Keep a dry clean dressing on the anesthesia/puncture wound site if there is drainage.  Once the wound has quit draining you may leave it open to air.  Generally you should leave the bandage intact for twenty four hours unless there is drainage.  If the epidural site drains for more than 36-48 hours please call the anesthesia department. ° °QUESTIONS?:  Please feel free to call your physician or the hospital operator if you have any questions, and they will be happy to assist you.    ° ° ° °

## 2017-03-25 ENCOUNTER — Encounter (HOSPITAL_COMMUNITY): Payer: Self-pay | Admitting: Ophthalmology

## 2017-03-28 ENCOUNTER — Ambulatory Visit: Payer: PPO | Admitting: "Endocrinology

## 2017-04-01 ENCOUNTER — Encounter (HOSPITAL_COMMUNITY)
Admission: RE | Admit: 2017-04-01 | Discharge: 2017-04-01 | Disposition: A | Discharge status: Home / Self Care | Payer: PPO | Source: Ambulatory Visit | Attending: Ophthalmology | Admitting: Ophthalmology

## 2017-04-07 DIAGNOSIS — G2581 Restless legs syndrome: Secondary | ICD-10-CM | POA: Diagnosis not present

## 2017-04-07 DIAGNOSIS — R413 Other amnesia: Secondary | ICD-10-CM | POA: Diagnosis not present

## 2017-04-07 DIAGNOSIS — J449 Chronic obstructive pulmonary disease, unspecified: Secondary | ICD-10-CM | POA: Diagnosis not present

## 2017-04-07 DIAGNOSIS — F319 Bipolar disorder, unspecified: Secondary | ICD-10-CM | POA: Diagnosis not present

## 2017-04-11 DIAGNOSIS — R413 Other amnesia: Secondary | ICD-10-CM | POA: Diagnosis not present

## 2017-04-11 DIAGNOSIS — F319 Bipolar disorder, unspecified: Secondary | ICD-10-CM | POA: Diagnosis not present

## 2017-04-11 DIAGNOSIS — G2581 Restless legs syndrome: Secondary | ICD-10-CM | POA: Diagnosis not present

## 2017-04-11 DIAGNOSIS — J449 Chronic obstructive pulmonary disease, unspecified: Secondary | ICD-10-CM | POA: Diagnosis not present

## 2017-04-14 DIAGNOSIS — H25812 Combined forms of age-related cataract, left eye: Secondary | ICD-10-CM | POA: Diagnosis not present

## 2017-04-16 ENCOUNTER — Encounter (HOSPITAL_COMMUNITY): Payer: Self-pay

## 2017-04-16 ENCOUNTER — Ambulatory Visit: Payer: PPO | Admitting: "Endocrinology

## 2017-04-16 ENCOUNTER — Encounter (HOSPITAL_COMMUNITY)
Admission: RE | Admit: 2017-04-16 | Discharge: 2017-04-16 | Disposition: A | Discharge status: Home / Self Care | Payer: PPO | Source: Ambulatory Visit | Attending: Ophthalmology | Admitting: Ophthalmology

## 2017-04-18 ENCOUNTER — Ambulatory Visit (HOSPITAL_COMMUNITY): Payer: PPO | Admitting: Anesthesiology

## 2017-04-18 ENCOUNTER — Ambulatory Visit (HOSPITAL_COMMUNITY)
Admission: RE | Admit: 2017-04-18 | Discharge: 2017-04-18 | Disposition: A | Discharge status: Home / Self Care | Payer: PPO | Source: Ambulatory Visit | Attending: Ophthalmology | Admitting: Ophthalmology

## 2017-04-18 ENCOUNTER — Encounter (HOSPITAL_COMMUNITY): Payer: Self-pay

## 2017-04-18 ENCOUNTER — Encounter (HOSPITAL_COMMUNITY)
Admission: RE | Disposition: A | Discharge status: Home / Self Care | Payer: Self-pay | Source: Ambulatory Visit | Attending: Ophthalmology

## 2017-04-18 DIAGNOSIS — G4733 Obstructive sleep apnea (adult) (pediatric): Secondary | ICD-10-CM | POA: Diagnosis not present

## 2017-04-18 DIAGNOSIS — K219 Gastro-esophageal reflux disease without esophagitis: Secondary | ICD-10-CM | POA: Insufficient documentation

## 2017-04-18 DIAGNOSIS — F419 Anxiety disorder, unspecified: Secondary | ICD-10-CM | POA: Insufficient documentation

## 2017-04-18 DIAGNOSIS — Z87891 Personal history of nicotine dependence: Secondary | ICD-10-CM | POA: Diagnosis not present

## 2017-04-18 DIAGNOSIS — F319 Bipolar disorder, unspecified: Secondary | ICD-10-CM | POA: Insufficient documentation

## 2017-04-18 DIAGNOSIS — G4730 Sleep apnea, unspecified: Secondary | ICD-10-CM | POA: Diagnosis not present

## 2017-04-18 DIAGNOSIS — E1136 Type 2 diabetes mellitus with diabetic cataract: Secondary | ICD-10-CM | POA: Diagnosis not present

## 2017-04-18 DIAGNOSIS — K449 Diaphragmatic hernia without obstruction or gangrene: Secondary | ICD-10-CM | POA: Diagnosis not present

## 2017-04-18 DIAGNOSIS — H25812 Combined forms of age-related cataract, left eye: Secondary | ICD-10-CM | POA: Diagnosis not present

## 2017-04-18 DIAGNOSIS — R51 Headache: Secondary | ICD-10-CM | POA: Diagnosis not present

## 2017-04-18 DIAGNOSIS — R413 Other amnesia: Secondary | ICD-10-CM

## 2017-04-18 DIAGNOSIS — H2512 Age-related nuclear cataract, left eye: Secondary | ICD-10-CM | POA: Diagnosis not present

## 2017-04-18 HISTORY — PX: CATARACT EXTRACTION W/PHACO: SHX586

## 2017-04-18 LAB — GLUCOSE, CAPILLARY: Glucose-Capillary: 140 mg/dL — ABNORMAL HIGH (ref 65–99)

## 2017-04-18 SURGERY — PHACOEMULSIFICATION, CATARACT, WITH IOL INSERTION
Anesthesia: Monitor Anesthesia Care | Laterality: Left

## 2017-04-18 MED ORDER — PHENYLEPHRINE HCL 2.5 % OP SOLN
1.0000 [drp] | OPHTHALMIC | Status: AC
  Administered 2017-04-18 (×3): 1 [drp] via OPHTHALMIC

## 2017-04-18 MED ORDER — EPINEPHRINE PF 1 MG/ML IJ SOLN
INTRAOCULAR | Status: DC | PRN
  Administered 2017-04-18: 500 mL

## 2017-04-18 MED ORDER — SODIUM HYALURONATE 23 MG/ML IO SOLN
INTRAOCULAR | Status: DC | PRN
  Administered 2017-04-18: 0.6 mL via INTRAOCULAR

## 2017-04-18 MED ORDER — FENTANYL CITRATE (PF) 100 MCG/2ML IJ SOLN
25.0000 ug | Freq: Once | INTRAMUSCULAR | Status: AC
  Administered 2017-04-18: 25 ug via INTRAVENOUS
  Filled 2017-04-18: qty 2

## 2017-04-18 MED ORDER — NEOMYCIN-POLYMYXIN-DEXAMETH 3.5-10000-0.1 OP SUSP
OPHTHALMIC | Status: DC | PRN
  Administered 2017-04-18: 2 [drp] via OPHTHALMIC

## 2017-04-18 MED ORDER — MIDAZOLAM HCL 2 MG/2ML IJ SOLN
1.0000 mg | INTRAMUSCULAR | Status: AC
  Administered 2017-04-18: 1 mg via INTRAVENOUS
  Filled 2017-04-18: qty 2

## 2017-04-18 MED ORDER — PROVISC 10 MG/ML IO SOLN
INTRAOCULAR | Status: DC | PRN
  Administered 2017-04-18: 0.85 mL via INTRAOCULAR

## 2017-04-18 MED ORDER — POVIDONE-IODINE 5 % OP SOLN
OPHTHALMIC | Status: DC | PRN
  Administered 2017-04-18: 1 via OPHTHALMIC

## 2017-04-18 MED ORDER — LACTATED RINGERS IV SOLN
INTRAVENOUS | Status: DC
  Administered 2017-04-18 (×2): via INTRAVENOUS

## 2017-04-18 MED ORDER — EPINEPHRINE PF 1 MG/ML IJ SOLN
INTRAMUSCULAR | Status: AC
  Filled 2017-04-18: qty 2

## 2017-04-18 MED ORDER — CYCLOPENTOLATE-PHENYLEPHRINE 0.2-1 % OP SOLN
1.0000 [drp] | OPHTHALMIC | Status: AC
  Administered 2017-04-18 (×3): 1 [drp] via OPHTHALMIC

## 2017-04-18 MED ORDER — LIDOCAINE HCL 3.5 % OP GEL
1.0000 "application " | Freq: Once | OPHTHALMIC | Status: AC
  Administered 2017-04-18: 1 via OPHTHALMIC

## 2017-04-18 MED ORDER — BSS IO SOLN
INTRAOCULAR | Status: DC | PRN
  Administered 2017-04-18: 15 mL via INTRAOCULAR

## 2017-04-18 MED ORDER — LIDOCAINE HCL (PF) 1 % IJ SOLN
INTRAOCULAR | Status: DC | PRN
  Administered 2017-04-18: 1 mL via OPHTHALMIC

## 2017-04-18 MED ORDER — TETRACAINE HCL 0.5 % OP SOLN
1.0000 [drp] | OPHTHALMIC | Status: AC
  Administered 2017-04-18 (×3): 1 [drp] via OPHTHALMIC

## 2017-04-18 SURGICAL SUPPLY — 18 items
CLOTH BEACON ORANGE TIMEOUT ST (SAFETY) ×2 IMPLANT
EYE SHIELD UNIVERSAL CLEAR (GAUZE/BANDAGES/DRESSINGS) ×2 IMPLANT
GLOVE BIOGEL PI IND STRL 6.5 (GLOVE) IMPLANT
GLOVE BIOGEL PI IND STRL 7.0 (GLOVE) IMPLANT
GLOVE BIOGEL PI IND STRL 7.5 (GLOVE) IMPLANT
GLOVE BIOGEL PI INDICATOR 6.5 (GLOVE) ×2
GLOVE BIOGEL PI INDICATOR 7.0 (GLOVE) ×2
GLOVE BIOGEL PI INDICATOR 7.5 (GLOVE) ×2
GLOVE EXAM NITRILE LRG STRL (GLOVE) IMPLANT
GLOVE EXAM NITRILE MD LF STRL (GLOVE) IMPLANT
LENS ALC ACRYL/TECN (Ophthalmic Related) ×2 IMPLANT
NDL HYPO 18GX1.5 BLUNT FILL (NEEDLE) IMPLANT
NEEDLE HYPO 18GX1.5 BLUNT FILL (NEEDLE) ×3 IMPLANT
PAD ARMBOARD 7.5X6 YLW CONV (MISCELLANEOUS) ×2 IMPLANT
RING MALYGIN (MISCELLANEOUS) IMPLANT
SYR TB 1ML LL NO SAFETY (SYRINGE) ×2 IMPLANT
VISCOELASTIC ADDITIONAL (OPHTHALMIC RELATED) ×2 IMPLANT
WATER STERILE IRR 250ML POUR (IV SOLUTION) ×2 IMPLANT

## 2017-04-18 NOTE — Transfer of Care (Signed)
Immediate Anesthesia Transfer of Care Note  Patient: Norma [name]  Procedure(s) Performed: CATARACT EXTRACTION PHACO AND INTRAOCULAR LENS PLACEMENT LEFT EYE (Left )  Patient Location: Short Stay  Anesthesia Type:MAC  Level of Consciousness: awake, alert , oriented and patient cooperative  Airway & Oxygen Therapy: Patient Spontanous Breathing  Post-op Assessment: Report given to RN and Post -op Vital signs reviewed and stable  Post vital signs: Reviewed and stable  Last Vitals:  Vitals:   04/18/17 0835 04/18/17 0853  BP: (!) 118/57 (!) 124/50  Pulse:    Resp: 19 18  Temp:    SpO2: 94% 97%    Last Pain:  Vitals:   04/18/17 0819  TempSrc: Oral  PainSc: 2          Complications: No apparent anesthesia complications

## 2017-04-18 NOTE — Anesthesia Postprocedure Evaluation (Signed)
Anesthesia Post Note  Patient: Norma [name]  Procedure(s) Performed: CATARACT EXTRACTION PHACO AND INTRAOCULAR LENS PLACEMENT LEFT EYE (Left )  Patient location during evaluation: Short Stay Anesthesia Type: MAC Level of consciousness: awake and alert, oriented and patient cooperative Pain management: pain level controlled Vital Signs Assessment: post-procedure vital signs reviewed and stable Respiratory status: spontaneous breathing Cardiovascular status: stable Postop Assessment: no apparent nausea or vomiting Anesthetic complications: no     Last Vitals:  Vitals:   04/18/17 0835 04/18/17 0853  BP: (!) 118/57 (!) 124/50  Pulse:    Resp: 19 18  Temp:    SpO2: 94% 97%    Last Pain:  Vitals:   04/18/17 0819  TempSrc: Oral  PainSc: 2                  ADAMS, AMY A

## 2017-04-18 NOTE — Anesthesia Preprocedure Evaluation (Signed)
Anesthesia Evaluation  Patient identified by MRN, date of birth, ID band Patient awake    Airway Mallampati: I  TM Distance: >3 FB     Dental  (+) Teeth Intact   Pulmonary sleep apnea , COPD, former smoker,    Pulmonary exam normal breath sounds clear to auscultation       Cardiovascular + dysrhythmias (LBBB)  Rhythm:Regular Rate:Normal     Neuro/Psych  Headaches, Anxiety Depression Bipolar Disorder Restless leg syndrome  Neuromuscular disease    GI/Hepatic hiatal hernia, GERD  ,  Endo/Other  diabetesHypothyroidism   Renal/GU      Musculoskeletal   Abdominal   Peds  Hematology   Anesthesia Other Findings   Reproductive/Obstetrics                             Anesthesia Physical Anesthesia Plan  ASA: III  Anesthesia Plan: MAC   Post-op Pain Management:    Induction:   PONV Risk Score and Plan:   Airway Management Planned: Nasal Cannula  Additional Equipment:   Intra-op Plan:   Post-operative Plan:   Informed Consent: I have reviewed the patients History and Physical, chart, labs and discussed the procedure including the risks, benefits and alternatives for the proposed anesthesia with the patient or authorized representative who has indicated his/her understanding and acceptance.     Plan Discussed with: CRNA  Anesthesia Plan Comments:         Anesthesia Quick Evaluation

## 2017-04-18 NOTE — Discharge Instructions (Signed)
Please discharge patient when stable, will follow up today with Dr. Dajae Kizer at the [facility] office immediately following discharge.  Leave shield in place until visit.  All paperwork with discharge instructions will be given at the office. ° °

## 2017-04-18 NOTE — Anesthesia Procedure Notes (Signed)
Procedure Name: MAC Date/Time: 04/18/2017 9:51 AM Performed by: Andree Elk Amy A, CRNA Pre-anesthesia Checklist: Patient identified, Emergency Drugs available, Suction available and Patient being monitored Oxygen Delivery Method: Nasal cannula

## 2017-04-18 NOTE — Op Note (Signed)
Date of procedure: 04/18/17  Pre-operative diagnosis: Visually significant cataract, Left Eye  Post-operative diagnosis: Visually significant cataract, Left Eye  Procedure: Removal of cataract via phacoemulsification and insertion of intra-ocular lens AMO PCB00  +17.5D into the capsular bag of the Left Eye  Attending surgeon: Gerda Diss. Willadean Guyton, MD, MA  Anesthesia: MAC, Topical [place]  Complications: None  Estimated Blood Loss: <43m (minimal)  Specimens: None  Implants: As above  Indications:  Visually significant cataract, Left Eye  Procedure:  The patient was seen and identified in the pre-operative area. The operative eye was identified and dilated.  The operative eye was marked.  Topical anesthesia was administered to the operative eye.     The patient was then to the operative suite and placed in the supine position.  A timeout was performed confirming the patient, procedure to be performed, and all other relevant information.   The patient's face was prepped and draped in the usual fashion for intra-ocular surgery.  A lid speculum was placed into the operative eye and the surgical microscope moved into place and focused.  A superotemporal paracentesis was created using a 20 gauge paracentesis blade.  Shugarcaine was injected into the anterior chamber.  Viscoelastic was injected into the anterior chamber.  A temporal clear-corneal main wound incision was created using a 2.450mmicrokeratome.  A continuous curvilinear capsulorrhexis was initiated using an irrigating cystitome and completed using capsulorrhexis forceps.  Hydrodissection and hydrodeliniation were performed.  Viscoelastic was injected into the anterior chamber.  A phacoemulsification handpiece and a chopper as a second instrument were used to remove the nucleus and epinucleus. The irrigation/aspiration handpiece was used to remove any remaining cortical material.   The capsular bag was reinflated with viscoelastic, checked,  and found to be intact.  The intraocular lens was inserted into the capsular bag and dialed into place using a Kuglen hook.  The irrigation/aspiration handpiece was used to remove any remaining viscoelastic.  The clear corneal wound and paracentesis wounds were then hydrated and checked with Weck-Cels to be watertight.  The lid-speculum and drape was removed, and the patient's face was cleaned with a wet and dry 4x4.  Maxitrol was instilled in the eye before a clear shield was taped over the eye. The patient was taken to the post-operative care unit in good condition, having tolerated the procedure well.  Post-Op Instructions: The patient will follow up at [facility] a same day post-operative evaluation and will receive all other orders and instructions.

## 2017-04-18 NOTE — H&P (Signed)
The H and P was reviewed and updated. The patient was examined.  No changes were found after exam.  The surgical eye was marked.  

## 2017-04-21 ENCOUNTER — Encounter (HOSPITAL_COMMUNITY): Payer: Self-pay | Admitting: Ophthalmology

## 2017-04-21 ENCOUNTER — Ambulatory Visit: Payer: PPO | Admitting: "Endocrinology

## 2017-04-21 ENCOUNTER — Other Ambulatory Visit (HOSPITAL_COMMUNITY): Payer: Self-pay | Admitting: Pulmonary Disease

## 2017-04-21 DIAGNOSIS — R413 Other amnesia: Secondary | ICD-10-CM

## 2017-04-22 ENCOUNTER — Encounter: Payer: Self-pay | Admitting: "Endocrinology

## 2017-04-22 ENCOUNTER — Ambulatory Visit (INDEPENDENT_AMBULATORY_CARE_PROVIDER_SITE_OTHER): Payer: PPO | Admitting: "Endocrinology

## 2017-04-22 VITALS — BP 119/71 | HR 98 | Ht 64.0 in | Wt 252.0 lb

## 2017-04-22 DIAGNOSIS — E038 Other specified hypothyroidism: Secondary | ICD-10-CM | POA: Diagnosis not present

## 2017-04-22 DIAGNOSIS — E1165 Type 2 diabetes mellitus with hyperglycemia: Secondary | ICD-10-CM

## 2017-04-22 DIAGNOSIS — I10 Essential (primary) hypertension: Secondary | ICD-10-CM

## 2017-04-22 DIAGNOSIS — IMO0002 Reserved for concepts with insufficient information to code with codable children: Secondary | ICD-10-CM

## 2017-04-22 DIAGNOSIS — Z794 Long term (current) use of insulin: Secondary | ICD-10-CM | POA: Diagnosis not present

## 2017-04-22 DIAGNOSIS — E782 Mixed hyperlipidemia: Secondary | ICD-10-CM | POA: Diagnosis not present

## 2017-04-22 DIAGNOSIS — E118 Type 2 diabetes mellitus with unspecified complications: Secondary | ICD-10-CM | POA: Diagnosis not present

## 2017-04-22 NOTE — Progress Notes (Signed)
Subjective:    Patient ID: Norma [name], female    DOB: Jun 16, 1950,    Past Medical History:  Diagnosis Date  . Anemia   . Anxiety   . Bipolar disorder ([place])   . Bulging lumbar disc    L3-4  . Chronic daily headache   . Chronic low back pain 09/20/2014  . COPD (chronic obstructive pulmonary disease) ([place])   . Degenerative arthritis   . Depression   . Diabetes mellitus   . Diabetes mellitus, type II ([place])   . DM type 2 with diabetic peripheral neuropathy ([place]) 09/20/2014  . Dyslipidemia   . Dyspnea   . Gastroparesis   . GERD (gastroesophageal reflux disease)   . Heart murmur   . History of hiatal hernia   . Hypothyroidism   . IBS (irritable bowel syndrome)   . Memory difficulty 09/20/2014  . Morbid obesity ([place])   . Neuropathy   . Obstructive sleep apnea on CPAP   . Restless legs syndrome (RLS) 09/17/2012   Past Surgical History:  Procedure Laterality Date  . ABDOMINAL HYSTERECTOMY    . ARTHROSCOPY KNEE W/ DRILLING  06/2011   and Decemer of 2013.  [name] Carpel tunnel  1980's  . CATARACT EXTRACTION W/PHACO Right 03/21/2017   Procedure: CATARACT EXTRACTION PHACO AND INTRAOCULAR LENS PLACEMENT RIGHT EYE;  Surgeon: Baruch Goldmann, MD;  Location: [facility];  Service: Ophthalmology;  Laterality: Right;  CDE: 2.91   . CATARACT EXTRACTION W/PHACO Left 04/18/2017   Procedure: CATARACT EXTRACTION PHACO AND INTRAOCULAR LENS PLACEMENT LEFT EYE;  Surgeon: Baruch Goldmann, MD;  Location: [facility];  Service: Ophthalmology;  Laterality: Left;  left  . CHOLECYSTECTOMY    . COLONOSCOPY WITH PROPOFOL N/A 10/04/2016   Procedure: COLONOSCOPY WITH PROPOFOL;  Surgeon: Rogene Houston, MD;  Location: [facility];  Service: Endoscopy;  Laterality: N/A;  11:10  . ESOPHAGOGASTRODUODENOSCOPY N/A 04/25/2016   Procedure: ESOPHAGOGASTRODUODENOSCOPY (EGD);  Surgeon: Rogene Houston, MD;  Location: [facility];  Service: Endoscopy;  Laterality: N/A;  730  . RECTAL SURGERY     fissure  . SHOULDER SURGERY  Left    arthroscopy in March of this year   Social History   Socioeconomic History  . Marital status: Single    Spouse name: None  . Number of children: 0  . Years of education: 57  . Highest education level: None  Social Needs  . Financial resource strain: None  . Food insecurity - worry: None  . Food insecurity - inability: None  . Transportation needs - medical: None  . Transportation needs - non-medical: None  Occupational History    Employer: [employer]  Tobacco Use  . Smoking status: Former Smoker    Types: Cigarettes    Last attempt to quit: 02/04/1971    Years since quitting: 46.2  . Smokeless tobacco: Never Used  . Tobacco comment: smoked 2 cigarettes a day  Substance and Sexual Activity  . Alcohol use: No    Alcohol/week: 0.0 oz  . Drug use: No  . Sexual activity: No  Other Topics Concern  . None  Social History Narrative   Patient lives at home alone.    Patient has no children.    Patient has her masters in nursing.    Patient is single.    Patient drinks about 2 glasses of tea daily.   Patient is right handed.   Outpatient Encounter Medications as of 04/22/2017  Medication Sig  . albuterol (  PROVENTIL HFA;VENTOLIN HFA) 108 (90 Base) MCG/ACT inhaler Inhale 2 puffs into the lungs every 6 (six) hours as needed for wheezing or shortness of breath.  . Aspirin-Acetaminophen-Caffeine (EXCEDRIN MIGRAINE PO) Take 2 tablets by mouth daily as needed (pain). Take with tramadol  . buPROPion (WELLBUTRIN XL) 300 MG 24 hr tablet TAKE ONE TABLET BY MOUTH EVERY MORNING.  . Charcoal Activated (CHARCOAL PO) Take 2 capsules by mouth daily as needed (FOR CHEST PAIN).  [name] diclofenac sodium (VOLTAREN) 1 % GEL Apply 1 application topically as needed (pain).   [name] EPINEPHrine (EPIPEN IJ) Inject 1 Dose as directed as needed (allergic reaction).   [name] esomeprazole (NEXIUM) 40 MG capsule Take 40 mg by mouth 2 (two) times daily.  [name] gabapentin (NEURONTIN) 300 MG capsule Take 900 mg  by mouth 2 (two) times daily.   . Insulin Glargine (TOUJEO SOLOSTAR) 300 UNIT/ML SOPN Inject 30 Units into the skin at bedtime.  . lamoTRIgine (LAMICTAL) 150 MG tablet TAKE ONE TABLET BY MOUTH AT BEDTIME.  [name] levothyroxine (SYNTHROID, LEVOTHROID) 100 MCG tablet Take 100 mcg by mouth daily before breakfast.   . LITE TOUCH PEN NEEDLES 31G X 5 MM MISC USE ONE DAILY.  [name] loratadine-pseudoephedrine (CLARITIN-D 24-HOUR) 10-240 MG 24 hr tablet Take 1 tablet by mouth at bedtime.  [name] losartan (COZAAR) 50 MG tablet Take 50 mg by mouth at bedtime.   . meclizine (ANTIVERT) 25 MG tablet Take 1 tablet (25 mg total) by mouth 3 (three) times daily as needed for dizziness. (Patient taking differently: Take 25 mg by mouth daily as needed for dizziness. )  . metFORMIN (GLUCOPHAGE) 500 MG tablet Take 2 tablets (1,000 mg total) by mouth 2 (two) times daily with a meal.  . oxymetazoline (AFRIN) 0.05 % nasal spray Place 1 spray into both nostrils as needed for congestion.  . promethazine (PHENERGAN) 25 MG tablet Take 25 mg by mouth at bedtime as needed for nausea or vomiting.   [name] rOPINIRole (REQUIP) 2 MG tablet Take 2-4 mg by mouth 2 (two) times daily. Takes 1 tablet in the morning and 2 tablets in the evening  . traMADol (ULTRAM) 50 MG tablet Take 2 tablets (100 mg total) by mouth every 8 (eight) hours as needed for pain. (Patient taking differently: Take 100 mg by mouth every 6 (six) hours as needed for moderate pain (takes at bedtime every night). )  . venlafaxine XR (EFFEXOR-XR) 150 MG 24 hr capsule TAKE TWO (2) CAPSULES BY MOUTH AT BEDTIME. (Patient taking differently: TAKE 1 CAPSULE BY MOUTH TWICE DAILY)  . [DISCONTINUED] canagliflozin (INVOKANA) 100 MG TABS tablet TAKE ONE TABLET BY MOUTH DAILY BEFORE BREAKFAST (Patient taking differently: Take 100 mg by mouth daily before breakfast. )   No facility-administered encounter medications on file as of 04/22/2017.    ALLERGIES: Allergies  Allergen Reactions  .  Amoxicillin Anaphylaxis and Other (See Comments)    Has patient had a PCN reaction causing immediate rash, facial/tongue/throat swelling, SOB or lightheadedness with hypotension: Yes Has patient had a PCN reaction causing severe rash involving mucus membranes or skin necrosis: Yes Has patient had a PCN reaction that required hospitalization: Yes Has patient had a PCN reaction occurring within the last 10 years: Yes If all of the above answers are "NO", then may proceed with Cephalosporin use.   [name] Hydrocodone Anaphylaxis  . Hydrocodone-Acetaminophen Other (See Comments)  . Depacon [Valproic Acid] Other (See Comments)    Causes falls    VACCINATION STATUS:  There is no  immunization history on file for this patient.  Diabetes  She presents for her follow-up diabetic visit. She has type 2 diabetes mellitus. Onset time: She was diagnosed at approximate age of 57 years. Her disease course has been worsening. There are no hypoglycemic associated symptoms. Pertinent negatives for hypoglycemia include no confusion, headaches, pallor or seizures. Associated symptoms include fatigue. Pertinent negatives for diabetes include no chest pain, no polydipsia, no polyphagia and no polyuria. There are no hypoglycemic complications. Symptoms are worsening. There are no diabetic complications. Risk factors for coronary artery disease include diabetes mellitus, dyslipidemia, hypertension, obesity and sedentary lifestyle. Current diabetic treatment includes oral agent (dual therapy). She is compliant with treatment most of the time. Her weight is stable. She is following a generally unhealthy diet. She has not had a previous visit with a dietitian. She never participates in exercise. Blood glucose monitoring compliance is inadequate. Her breakfast blood glucose range is generally 140-180 mg/dl. Her overall blood glucose range is 140-180 mg/dl. (She monitored inadequately, 13 readings in the last 30 days average 168.) An  ACE inhibitor/angiotensin II receptor blocker is being taken. Eye exam is current.  Hypertension  This is a chronic problem. The current episode started more than 1 year ago. Pertinent negatives include no chest pain, headaches, palpitations or shortness of breath. Past treatments include ACE inhibitors. Compliance problems include diet, exercise, medication cost, medication side effects and psychosocial issues.  Identifiable causes of hypertension include a thyroid problem.  Hyperlipidemia  This is a chronic problem. The current episode started more than 1 year ago. Pertinent negatives include no chest pain, myalgias or shortness of breath. Current antihyperlipidemic treatment includes statins. Risk factors for coronary artery disease include a sedentary lifestyle, obesity, hypertension, diabetes mellitus and dyslipidemia.  Thyroid Problem  Presents for follow-up visit. Symptoms include fatigue. Patient reports no cold intolerance, diarrhea, heat intolerance or palpitations. The symptoms have been stable. Past treatments include levothyroxine. Her past medical history is significant for hyperlipidemia.     Review of Systems  Constitutional: Positive for fatigue. Negative for unexpected weight change.  HENT: Negative for trouble swallowing and voice change.   Eyes: Negative for visual disturbance.  Respiratory: Negative for cough, shortness of breath and wheezing.   Cardiovascular: Negative for chest pain, palpitations and leg swelling.  Gastrointestinal: Negative for diarrhea, nausea and vomiting.  Endocrine: Negative for cold intolerance, heat intolerance, polydipsia, polyphagia and polyuria.  Musculoskeletal: Negative for arthralgias and myalgias.  Skin: Negative for color change, pallor, rash and wound.  Neurological: Negative for seizures and headaches.  Psychiatric/Behavioral: Negative for confusion and suicidal ideas.    Objective:    BP 119/71   Pulse 98   Ht _0  (1.626 m)   Wt  252 lb (114.3 kg)   BMI 43.26 kg/m   Wt Readings from Last 3 Encounters:  04/22/17 252 lb (114.3 kg)  03/21/17 250 lb (113.4 kg)  03/14/17 250 lb 12.8 oz (113.8 kg)    Physical Exam  Constitutional: She is oriented to person, place, and time. She appears well-developed.  HENT:  Head: Normocephalic and atraumatic.  Eyes: EOM are normal.  Neck: Normal range of motion. Neck supple. No tracheal deviation present. No thyromegaly present.  Cardiovascular: Normal rate and regular rhythm.  Pulmonary/Chest: Effort normal and breath sounds normal.  Abdominal: Soft. Bowel sounds are normal. There is no tenderness. There is no guarding.  Musculoskeletal: Normal range of motion. She exhibits no edema.  Neurological: She is alert and oriented to person,  place, and time. She has normal reflexes. No cranial nerve deficit. Coordination normal.  Skin: Skin is warm and dry. No rash noted. No erythema. No pallor.  Psychiatric: She has a normal mood and affect. Judgment normal.   Recent Results (from the past 2160 hour(s))  Hemoglobin A1c     Status: Abnormal   Collection Time: 03/14/17  9:58 AM  Result Value Ref Range   Hgb A1c MFr Bld 6.7 (H) 4.8 - 5.6 %    Comment: (NOTE) Pre diabetes:          5.7%-6.4% Diabetes:              >6.4% Glycemic control for   <7.0% adults with diabetes    Mean Plasma Glucose 145.59 mg/dL    Comment: Performed at [facility] [address], [place], [place] [address]  CBC with Differential/Platelet     Status: None   Collection Time: 03/14/17  9:58 AM  Result Value Ref Range   WBC 10.0 4.0 - 10.5 K/uL   RBC 4.20 3.87 - 5.11 MIL/uL   Hemoglobin 12.2 12.0 - 15.0 g/dL   HCT 38.7 36.0 - 46.0 %   MCV 92.1 78.0 - 100.0 fL   MCH 29.0 26.0 - 34.0 pg   MCHC 31.5 30.0 - 36.0 g/dL   RDW 14.6 11.5 - 15.5 %   Platelets 348 150 - 400 K/uL   Neutrophils Relative % 75 %   Neutro Abs 7.5 1.7 - 7.7 K/uL   Lymphocytes Relative 14 %   Lymphs Abs 1.4 0.7 - 4.0 K/uL    Monocytes Relative 8 %   Monocytes Absolute 0.8 0.1 - 1.0 K/uL   Eosinophils Relative 3 %   Eosinophils Absolute 0.3 0.0 - 0.7 K/uL   Basophils Relative 0 %   Basophils Absolute 0.0 0.0 - 0.1 K/uL  Basic metabolic panel     Status: Abnormal   Collection Time: 03/14/17  9:58 AM  Result Value Ref Range   Sodium 136 135 - 145 mmol/L   Potassium 3.9 3.5 - 5.1 mmol/L   Chloride 101 101 - 111 mmol/L   CO2 25 22 - 32 mmol/L   Glucose, Bld 175 (H) 65 - 99 mg/dL   BUN 19 6 - 20 mg/dL   Creatinine, Ser 0.90 0.44 - 1.00 mg/dL   Calcium 9.0 8.9 - 10.3 mg/dL   GFR calc non Af Amer >60 >60 mL/min   GFR calc Af Amer >60 >60 mL/min    Comment: (NOTE) The eGFR has been calculated using the CKD EPI equation. This calculation has not been validated in all clinical situations. eGFR's persistently <60 mL/min signify possible Chronic Kidney Disease.    Anion gap 10 5 - 15  Glucose, capillary     Status: Abnormal   Collection Time: 03/14/17 10:16 AM  Result Value Ref Range   Glucose-Capillary 161 (H) 65 - 99 mg/dL  Glucose, capillary     Status: Abnormal   Collection Time: 03/21/17  8:18 AM  Result Value Ref Range   Glucose-Capillary 141 (H) 65 - 99 mg/dL  Glucose, capillary     Status: Abnormal   Collection Time: 04/18/17  8:44 AM  Result Value Ref Range   Glucose-Capillary 140 (H) 65 - 99 mg/dL     Results for orders placed or performed during the hospital encounter of 04/18/17  Glucose, capillary  Result Value Ref Range   Glucose-Capillary 140 (H) 65 - 99 mg/dL   Diabetic Labs (  most recent): Lab Results  Component Value Date   HGBA1C 6.7 (H) 03/14/2017   HGBA1C 6.2 (H) 10/24/2016   HGBA1C 6.5 (H) 07/10/2016    Assessment & Plan:   1. Uncontrolled type 2 diabetes mellitus with complication , with long-term use of insulin  Her diabetes is  complicated by neuropathy and patient remains at a high risk for more acute and chronic complications of diabetes which include CAD, CVA, CKD,  retinopathy, and neuropathy. These are all discussed in detail with the patient.  Patient came with higher fasting blood glucose profile, A1c 6.7% increasing from 6.2%.   - I have re-counseled the patient on diet management and weight loss  by adopting a carbohydrate restricted / protein rich  Diet.  -  Suggestion is made for her to avoid simple carbohydrates  from her diet including Cakes, Sweet Desserts / Pastries, Ice Cream, Soda (diet and regular), Sweet Tea, Candies, Chips, Cookies, Store Bought Juices, Alcohol in Excess of  1-2 drinks a day, Artificial Sweeteners, and "Sugar-free" Products. This will help patient to have stable blood glucose profile and potentially avoid unintended weight gain.   - Patient is advised to stick to a routine mealtimes to eat 3 meals  a day and avoid unnecessary snacks ( to snack only to correct hypoglycemia).  - I have approached patient with the following individualized plan to manage diabetes and patient agrees.  -  She will continue to  require at least basal insulin in order for her to maintain control of diabetes to target.  -I will crease her basal insulin Toujeo to 30 units daily at bedtime associated with monitoring of blood glucose before breakfast and at bedtime .  - I will continue on metformin 1000 mg by mouth twice a day   with meals and discontinue  Invokana.  - Patient will be considered for incretin therapy as appropriate next visit. - Patient specific target  for A1c; LDL, HDL, Triglycerides, and  Waist Circumference were discussed in detail.  2) BP/HTN: Blood pressure is controlled to target, I advised her to continue current medications including ACEI/ARB. 3) Lipids/HPL:  continue statins. 4)  Weight/Diet: CDE consult in progress, exercise, and carbohydrates information provided.  5) Hypothyroidism  - Her thyroid function tests are consistent with appropriate replacement.  I advised her to continue levothyroxine 100 g by mouth  every morning.   - We discussed about correct intake of levothyroxine, at fasting, with water, separated by at least 30 minutes from breakfast, and separated by more than 4 hours from calcium, iron, multivitamins, acid reflux medications (PPIs). -Patient is made aware of the fact that thyroid hormone replacement is needed for life, dose to be adjusted by periodic monitoring of thyroid function tests.  6) Chronic Care/Health Maintenance:  -Patient is on ACEI/ARB and Statin medications and encouraged to continue to follow up with Ophthalmology, Podiatrist at least yearly or according to recommendations, and advised to  stay away from smoking. I have recommended yearly flu vaccine and pneumonia vaccination at least every 5 years; moderate intensity exercise for up to 150 minutes weekly; and  sleep for at least 7 hours a day.  I advised patient to maintain close follow up with her PCP for primary care needs. - Time spent with the patient: 25 min, of which >50% was spent in reviewing her blood glucose logs , discussing her hypo- and hyper-glycemic episodes, reviewing her current and  previous labs and insulin doses and developing a plan to avoid hypo-  and hyper-glycemia. Please refer to Patient Instructions for Blood Glucose Monitoring and Insulin/Medications Dosing Guide"  in media tab for additional information.   Follow up plan: Return in about 3 months (around 07/21/2017) for follow up with pre-visit labs, meter, and logs.  Glade Lloyd, MD Phone: [phone]  Fax: [phone]  This note was partially dictated with voice recognition software. Similar sounding words can be transcribed inadequately or may not  be corrected upon review.  04/22/2017, 1:55 PM

## 2017-04-22 NOTE — Patient Instructions (Signed)

## 2017-04-24 ENCOUNTER — Ambulatory Visit (HOSPITAL_COMMUNITY): Payer: PPO

## 2017-04-29 ENCOUNTER — Ambulatory Visit (HOSPITAL_COMMUNITY): Payer: PPO

## 2017-05-02 DIAGNOSIS — G4730 Sleep apnea, unspecified: Secondary | ICD-10-CM | POA: Diagnosis not present

## 2017-05-02 DIAGNOSIS — J449 Chronic obstructive pulmonary disease, unspecified: Secondary | ICD-10-CM | POA: Diagnosis not present

## 2017-05-06 ENCOUNTER — Ambulatory Visit (HOSPITAL_COMMUNITY)
Admission: RE | Admit: 2017-05-06 | Discharge: 2017-05-06 | Disposition: A | Discharge status: Home / Self Care | Payer: PPO | Source: Ambulatory Visit | Attending: Pulmonary Disease | Admitting: Pulmonary Disease

## 2017-05-06 DIAGNOSIS — R9089 Other abnormal findings on diagnostic imaging of central nervous system: Secondary | ICD-10-CM | POA: Insufficient documentation

## 2017-05-06 DIAGNOSIS — I639 Cerebral infarction, unspecified: Secondary | ICD-10-CM | POA: Insufficient documentation

## 2017-05-06 DIAGNOSIS — R413 Other amnesia: Secondary | ICD-10-CM | POA: Diagnosis not present

## 2017-05-13 ENCOUNTER — Telehealth (INDEPENDENT_AMBULATORY_CARE_PROVIDER_SITE_OTHER): Payer: Self-pay | Admitting: *Deleted

## 2017-05-13 ENCOUNTER — Telehealth: Payer: Self-pay | Admitting: Neurology

## 2017-05-13 DIAGNOSIS — Z961 Presence of intraocular lens: Secondary | ICD-10-CM | POA: Diagnosis not present

## 2017-05-13 NOTE — Telephone Encounter (Signed)
Pt called she had MRI of the brain on 1/22 ordered by Dr Luan Pulling. She is wanting to know if Dr Jannifer Franklin will take a look at it and call to discuss with her. She said Dr Luan Pulling said there was nothing that could be done, she was wanting to make sure.

## 2017-05-13 NOTE — Telephone Encounter (Signed)
MRI of the brain does show an acute left hemispheric stroke, cortical.  This probably has nothing to do with her reported memory problems, but does deserve a bit further workup, I will get the patient to come in for a revisit, we may consider getting a 2D echocardiogram and carotid Doppler study.  The patient will need to go on low-dose aspirin if she is not already on this.  MRI brain 05/06/17:  IMPRESSION: 1. Acute subcentimeter infarct of the left precentral gyrus. 2. Mild chronic small vessel ischemic change in the cerebral white matter and pons. 3. Normal brain volume. No specific or reversible explanation for memory loss.

## 2017-05-13 NOTE — Telephone Encounter (Signed)
Patient called stating she is having a lot of chest pain and gas and she has been taking her charcoal but she cannot take it with her other medications and she is taking her nexium twice daily. Patient states it is not helping and she wants to know what else she needs to do. [phone]

## 2017-05-15 NOTE — Telephone Encounter (Signed)
Talked with the patient. She states that she had a Manometry 09/26/2016 at [facility]. She ask about what she can take for Gas and Burping, it was recommended that she try Phazyme OTC for this. Patient also states that earlier this month she had a small stroke  ,acute . She is waiting for an appointment with Neurologist , Dr.Willis. She feels that he will be doing a Echo , and Carotid Studies on her. She will contact us with a progress report and update form Dr.Willis.

## 2017-05-15 NOTE — Telephone Encounter (Signed)
Per Dr.Rehman follow up with Dr.Hawkins again. If, given clearance we will do the Manometry to rule out Esophageal Spasms. Patient was called and advised.

## 2017-05-16 ENCOUNTER — Encounter: Payer: Self-pay | Admitting: Neurology

## 2017-05-16 ENCOUNTER — Ambulatory Visit (INDEPENDENT_AMBULATORY_CARE_PROVIDER_SITE_OTHER): Payer: PPO | Admitting: Neurology

## 2017-05-16 VITALS — BP 141/80 | HR 103 | Ht 64.0 in | Wt 256.0 lb

## 2017-05-16 DIAGNOSIS — R413 Other amnesia: Secondary | ICD-10-CM | POA: Diagnosis not present

## 2017-05-16 DIAGNOSIS — I63132 Cerebral infarction due to embolism of left carotid artery: Secondary | ICD-10-CM | POA: Diagnosis not present

## 2017-05-16 DIAGNOSIS — R251 Tremor, unspecified: Secondary | ICD-10-CM

## 2017-05-16 DIAGNOSIS — I639 Cerebral infarction, unspecified: Secondary | ICD-10-CM | POA: Insufficient documentation

## 2017-05-16 HISTORY — DX: Cerebral infarction, unspecified: I63.9

## 2017-05-16 MED ORDER — BACLOFEN 10 MG PO TABS: 10.0000 mg | ORAL_TABLET | Freq: Every day | ORAL | 2 refills | Status: DC

## 2017-05-16 NOTE — Telephone Encounter (Signed)
Called and spoke with pt. Scheduled appt for today at 11am, check in 1045am. MD had cancellation.  Pt agreeable to date/time. Appreciative for call.

## 2017-05-16 NOTE — Progress Notes (Signed)
Reason for visit: Stroke  Norma [name] is a 67 y.o. female  History of present illness:  Norma [name] is a 67 year old right-handed white female with a history of diabetes, hypertension, and dyslipidemia.  The patient also has a history of frequent headaches, bipolar disorder, essential tremor, and a memory disorder.  The patient underwent MRI evaluation of the brain on 06 May 2017 for evaluation of the memory.  She had no new symptoms of numbness or weakness or speech changes or balance problems prior to the MRI evaluation.  The study showed a small acute cortical left parietal infarct.  The patient comes back to this office for an evaluation.  The patient has just started low-dose aspirin following the MRI.  The patient indicates that she does not smoke cigarettes.  She has not noted any difficulty with swallowing, or any problems with double vision or loss of vision.  She again reports no new numbness or weakness, she does have some numbness in the hands and feet related to her diabetic neuropathy.  MRI of the brain showed a mild degree of small vessel disease that was chronic as well.  Past Medical History:  Diagnosis Date  . Anemia   . Anxiety   . Bipolar disorder ([place])   . Bulging lumbar disc    L3-4  . Chronic daily headache   . Chronic low back pain 09/20/2014  . COPD (chronic obstructive pulmonary disease) ([place])   . Degenerative arthritis   . Depression   . Diabetes mellitus   . Diabetes mellitus, type II ([place])   . DM type 2 with diabetic peripheral neuropathy ([place]) 09/20/2014  . Dyslipidemia   . Dyspnea   . Gastroparesis   . GERD (gastroesophageal reflux disease)   . Heart murmur   . History of hiatal hernia   . Hypothyroidism   . IBS (irritable bowel syndrome)   . Memory difficulty 09/20/2014  . Morbid obesity ([place])   . Neuropathy   . Obstructive sleep apnea on CPAP   . Restless legs syndrome (RLS) 09/17/2012    Past Surgical History:  Procedure Laterality Date  .  ABDOMINAL HYSTERECTOMY    . ARTHROSCOPY KNEE W/ DRILLING  06/2011   and Decemer of 2013.  [name] Carpel tunnel  1980's  . CATARACT EXTRACTION W/PHACO Right 03/21/2017   Procedure: CATARACT EXTRACTION PHACO AND INTRAOCULAR LENS PLACEMENT RIGHT EYE;  Surgeon: Baruch Goldmann, MD;  Location: [facility];  Service: Ophthalmology;  Laterality: Right;  CDE: 2.91   . CATARACT EXTRACTION W/PHACO Left 04/18/2017   Procedure: CATARACT EXTRACTION PHACO AND INTRAOCULAR LENS PLACEMENT LEFT EYE;  Surgeon: Baruch Goldmann, MD;  Location: [facility];  Service: Ophthalmology;  Laterality: Left;  left  . CHOLECYSTECTOMY    . COLONOSCOPY WITH PROPOFOL N/A 10/04/2016   Procedure: COLONOSCOPY WITH PROPOFOL;  Surgeon: Rogene Houston, MD;  Location: [facility];  Service: Endoscopy;  Laterality: N/A;  11:10  . ESOPHAGOGASTRODUODENOSCOPY N/A 04/25/2016   Procedure: ESOPHAGOGASTRODUODENOSCOPY (EGD);  Surgeon: Rogene Houston, MD;  Location: [facility];  Service: Endoscopy;  Laterality: N/A;  730  . RECTAL SURGERY     fissure  . SHOULDER SURGERY Left    arthroscopy in March of this year    Family History  Problem Relation Age of Onset  . Hypertension Mother   . Lymphoma Mother   . Depression Mother   . Arthritis Father   . Alcohol abuse Father   . Hypertension Sister   . Cancer  Brother        kidney and lung  . Alcohol abuse Brother   . Alcohol abuse Paternal Uncle   . Alcohol abuse Paternal Grandfather   . Alcohol abuse Paternal Grandmother     Social history:  reports that she quit smoking about 46 years ago. Her smoking use included cigarettes. she has never used smokeless tobacco. She reports that she does not drink alcohol or use drugs.  Medications:  Prior to Admission medications   Medication Sig Start Date End Date Taking? Authorizing Provider  albuterol (PROVENTIL HFA;VENTOLIN HFA) 108 (90 Base) MCG/ACT inhaler Inhale 2 puffs into the lungs every 6 (six) hours as needed for wheezing or shortness of breath.    Yes [provider]  ASPIRIN 81 PO Take 1 tablet by mouth daily.   Yes [provider]  Aspirin-Acetaminophen-Caffeine (EXCEDRIN MIGRAINE PO) Take 2 tablets by mouth daily as needed (pain). Take with tramadol   Yes [provider]  buPROPion (WELLBUTRIN XL) 300 MG 24 hr tablet TAKE ONE TABLET BY MOUTH EVERY MORNING. 10/18/15  Yes Cloria Spring, MD  Charcoal Activated (CHARCOAL PO) Take 2 capsules by mouth daily as needed (FOR CHEST PAIN).   Yes [provider]  diclofenac sodium (VOLTAREN) 1 % GEL Apply 1 application topically as needed (pain).    Yes [provider]  EPINEPHrine (EPIPEN IJ) Inject 1 Dose as directed as needed (allergic reaction).    Yes [provider]  esomeprazole (NEXIUM) 40 MG capsule Take 40 mg by mouth 2 (two) times daily. 01/28/17  Yes [provider]  gabapentin (NEURONTIN) 300 MG capsule Take 900 mg by mouth 2 (two) times daily.    Yes [provider]  Insulin Glargine (TOUJEO SOLOSTAR) 300 UNIT/ML SOPN Inject 30 Units into the skin at bedtime.   Yes [provider]  lamoTRIgine (LAMICTAL) 150 MG tablet TAKE ONE TABLET BY MOUTH AT BEDTIME. 10/18/15  Yes Cloria Spring, MD  levothyroxine (SYNTHROID, LEVOTHROID) 100 MCG tablet Take 100 mcg by mouth daily before breakfast.    Yes [provider]  LITE TOUCH PEN NEEDLES 31G X 5 MM MISC USE ONE DAILY. 02/05/16  Yes Nida, Marella Chimes, MD  loratadine-pseudoephedrine (CLARITIN-D 24-HOUR) 10-240 MG 24 hr tablet Take 1 tablet by mouth at bedtime.   Yes [provider]  losartan (COZAAR) 50 MG tablet Take 50 mg by mouth at bedtime.    Yes [provider]  meclizine (ANTIVERT) 25 MG tablet Take 1 tablet (25 mg total) by mouth 3 (three) times daily as needed for dizziness. Patient taking differently: Take 25 mg by mouth daily as needed for dizziness.  03/29/14  Yes Virgel Manifold, MD  metFORMIN (GLUCOPHAGE) 500 MG tablet  Take 2 tablets (1,000 mg total) by mouth 2 (two) times daily with a meal. 02/15/15  Yes Nida, Marella Chimes, MD  oxymetazoline (AFRIN) 0.05 % nasal spray Place 1 spray into both nostrils as needed for congestion.   Yes [provider]  promethazine (PHENERGAN) 25 MG tablet Take 25 mg by mouth at bedtime as needed for nausea or vomiting.    Yes [provider]  rOPINIRole (REQUIP) 2 MG tablet Take 2-4 mg by mouth 2 (two) times daily. Takes 1 tablet in the morning and 2 tablets in the evening   Yes [provider]  traMADol (ULTRAM) 50 MG tablet Take 2 tablets (100 mg total) by mouth every 8 (eight) hours as needed for pain. Patient taking differently:  Take 100 mg by mouth every 6 (six) hours as needed for moderate pain (takes at bedtime every night).  12/25/12  Yes Kirsteins, Luanna Salk, MD  venlafaxine XR (EFFEXOR-XR) 150 MG 24 hr capsule TAKE TWO (2) CAPSULES BY MOUTH AT BEDTIME. Patient taking differently: TAKE 1 CAPSULE BY MOUTH TWICE DAILY 10/18/15  Yes Cloria Spring, MD      Allergies  Allergen Reactions  . Amoxicillin Anaphylaxis and Other (See Comments)    Has patient had a PCN reaction causing immediate rash, facial/tongue/throat swelling, SOB or lightheadedness with hypotension: Yes Has patient had a PCN reaction causing severe rash involving mucus membranes or skin necrosis: Yes Has patient had a PCN reaction that required hospitalization: Yes Has patient had a PCN reaction occurring within the last 10 years: Yes If all of the above answers are "NO", then may proceed with Cephalosporin use.   [name] Hydrocodone Anaphylaxis  . Hydrocodone-Acetaminophen Other (See Comments)  . Depacon [Valproic Acid] Other (See Comments)    Causes falls     ROS:  Out of a complete 14 system review of symptoms, the patient complains only of the following symptoms, and all other reviewed systems are negative.  Memory disturbance Tremor Insomnia  Blood pressure (!) 141/80,  pulse (!) 103, height 5\' 4"  (1.626 m), weight 256 lb (116.1 kg).  Physical Exam  General: The patient is alert and cooperative at the time of the examination.  The patient is moderately obese.  Eyes: Pupils are equal, round, and reactive to light. Discs are flat bilaterally.  Neck: The neck is supple, no carotid bruits are noted.  Respiratory: The respiratory examination is clear.  Cardiovascular: The cardiovascular examination reveals a regular rate and rhythm, no obvious murmurs or rubs are noted.  Skin: Extremities are with 1+ edema at the ankles bilaterally.  Neurologic Exam  Mental status: The patient is alert and oriented x 3 at the time of the examination. The patient has apparent normal recent and remote memory, with an apparently normal attention span and concentration ability.  Cranial nerves: Facial symmetry is present. There is good sensation of the face to pinprick and soft touch bilaterally. The strength of the facial muscles and the muscles to head turning and shoulder shrug are normal bilaterally. Speech is well enunciated, no aphasia or dysarthria is noted. Extraocular movements are full. Visual fields are full. The tongue is midline, and the patient has symmetric elevation of the soft palate. No obvious hearing deficits are noted.  Motor: The motor testing reveals 5 over 5 strength of all 4 extremities. Good symmetric motor tone is noted throughout.  Sensory: Sensory testing is intact to pinprick, soft touch, vibration sensation, and position sense on all 4 extremities, with exception of a stocking pattern pinprick sensory deficit up to the knees bilaterally. No evidence of extinction is noted.  Coordination: Cerebellar testing reveals good finger-nose-finger and heel-to-shin bilaterally.  Mild tremors are seen with finger-nose-finger bilaterally.  Gait and station: Gait is normal. Tandem gait is normal. Romberg is negative. No drift is seen.  Reflexes: Deep tendon  reflexes are symmetric, but are depressed bilaterally. Toes are downgoing bilaterally.   MRI brain 05/06/17:  IMPRESSION: 1. Acute subcentimeter infarct of the left precentral gyrus. 2. Mild chronic small vessel ischemic change in the cerebral white matter and pons. 3. Normal brain volume. No specific or reversible explanation for memory loss.  * MRI scan images were reviewed online. I agree with the written report.    Assessment/Plan:  1.  Left parietal stroke, acute  2.  Diabetes  3.  Hypertension  4.  Dyslipidemia  5.  Essential tremor  6.  Reported memory disturbance  7.  Chronic insomnia  8.  Restless leg syndrome  The patient is being evaluated for the recent acute stroke.  The stroke itself was clinically asymptomatic.  The patient has gone on low-dose aspirin.  We will check a 2D echocardiogram, we will get a carotid Doppler study.  The patient will be placed on baclofen 10 mg at night as she reports a tight sensation in the legs that keeps her awake.  She is on Requip which helps some but it is not fully managing her symptoms.  She will follow-up in 4 months.  She is now off of Topamax, she believes that her tremor has worsened slightly.  Stopping the Topamax did not help the memory.  We may consider medication for memory in the future.  Her diabetes is relatively well controlled, her last hemoglobin A1c was 6.7.  Jill Alexanders MD 05/16/2017 11:10 AM  [facility] [address] [place], [place] [address]  Phone [phone] Fax [phone]

## 2017-05-19 DIAGNOSIS — G4730 Sleep apnea, unspecified: Secondary | ICD-10-CM | POA: Diagnosis not present

## 2017-05-19 DIAGNOSIS — J449 Chronic obstructive pulmonary disease, unspecified: Secondary | ICD-10-CM | POA: Diagnosis not present

## 2017-05-21 ENCOUNTER — Telehealth: Payer: Self-pay | Admitting: Neurology

## 2017-05-21 NOTE — Telephone Encounter (Signed)
Patient is calling regarding scheduling a doppler and wants to check on her referral for an echocardiogram.

## 2017-05-23 NOTE — Telephone Encounter (Signed)
[name] has been trying to call and schedule Norma [name] has left message's. Patient will call them back at [phone] to schedule . I spoke with her.

## 2017-05-27 ENCOUNTER — Ambulatory Visit (HOSPITAL_COMMUNITY): Payer: PPO

## 2017-06-02 DIAGNOSIS — G4730 Sleep apnea, unspecified: Secondary | ICD-10-CM | POA: Diagnosis not present

## 2017-06-02 DIAGNOSIS — J449 Chronic obstructive pulmonary disease, unspecified: Secondary | ICD-10-CM | POA: Diagnosis not present

## 2017-06-06 ENCOUNTER — Telehealth: Payer: Self-pay | Admitting: Neurology

## 2017-06-06 ENCOUNTER — Ambulatory Visit (HOSPITAL_COMMUNITY)
Admission: RE | Admit: 2017-06-06 | Discharge: 2017-06-06 | Disposition: A | Discharge status: Home / Self Care | Payer: PPO | Source: Ambulatory Visit | Attending: Neurology | Admitting: Neurology

## 2017-06-06 ENCOUNTER — Ambulatory Visit (HOSPITAL_BASED_OUTPATIENT_CLINIC_OR_DEPARTMENT_OTHER)
Admission: RE | Admit: 2017-06-06 | Discharge: 2017-06-06 | Disposition: A | Discharge status: Home / Self Care | Payer: PPO | Source: Ambulatory Visit | Attending: Neurology | Admitting: Neurology

## 2017-06-06 DIAGNOSIS — I10 Essential (primary) hypertension: Secondary | ICD-10-CM | POA: Diagnosis not present

## 2017-06-06 DIAGNOSIS — E119 Type 2 diabetes mellitus without complications: Secondary | ICD-10-CM | POA: Diagnosis not present

## 2017-06-06 DIAGNOSIS — E785 Hyperlipidemia, unspecified: Secondary | ICD-10-CM | POA: Diagnosis not present

## 2017-06-06 DIAGNOSIS — I63132 Cerebral infarction due to embolism of left carotid artery: Secondary | ICD-10-CM

## 2017-06-06 DIAGNOSIS — J449 Chronic obstructive pulmonary disease, unspecified: Secondary | ICD-10-CM | POA: Diagnosis not present

## 2017-06-06 HISTORY — PX: TRANSTHORACIC ECHOCARDIOGRAM: SHX275

## 2017-06-06 NOTE — Progress Notes (Signed)
Bilateral carotid duplex completed. 1% to 39% ICA stenosis. Vertebral artery flow is antegrade. [facility], [place]  06/06/2017, 1:35 PM

## 2017-06-06 NOTE — Telephone Encounter (Signed)
   I called the patient. The carotid doppler is unremarkable and the 2D echo shows a low EF of 40 to 45%. Nothing seen that would alter current therapy. Stay on aspirin therapy.  Carotid doppler 06/06/17:  Final Interpretation: Right Carotid: Velocities in the right ICA are consistent with a 1-39% stenosis.  Left Carotid: Velocities in the left ICA are consistent with a 1-39% stenosis.  Vertebrals: Both vertebral arteries were patent with antegrade flow. Subclavians: Normal flow hemodynamics were seen in bilateral subclavian       arteries.    2D Echo 06/06/17:  ------------------------------------------------------------------- Study Conclusions  - Left ventricle: The cavity size was normal. There was mild   concentric hypertrophy. Systolic function was mildly to   moderately reduced. The estimated ejection fraction was in the   range of 40% to 45%. Hypokinesis of the anteroseptal,   inferoseptal and basal to mid inferior myocardium. Doppler   parameters are consistent with abnormal left ventricular   relaxation (grade 1 diastolic dysfunction). Doppler parameters   are consistent with indeterminate ventricular filling pressure. - Aortic valve: Transvalvular velocity was within the normal range.   There was no stenosis. There was no regurgitation. - Mitral valve: Transvalvular velocity was within the normal range.   There was no evidence for stenosis. There was no regurgitation. - Right ventricle: The cavity size was normal. Wall thickness was   normal. Systolic function was normal. - Tricuspid valve: There was trivial regurgitation. - Global longitudinal strain -11.9%.

## 2017-06-06 NOTE — Progress Notes (Signed)
  Echocardiogram 2D Echocardiogram has been performed.  Darlina Sicilian M 06/06/2017, 2:24 PM

## 2017-06-07 ENCOUNTER — Other Ambulatory Visit: Payer: Self-pay | Admitting: "Endocrinology

## 2017-06-11 ENCOUNTER — Telehealth: Payer: Self-pay | Admitting: "Endocrinology

## 2017-06-11 NOTE — Telephone Encounter (Signed)
Norma [name] Is asking if Dr. Dorris Fetch would call in a 90 day supply of Insulin Glargine (TOUJEO SOLOSTAR) 300 UNIT/ML SOPN she states it would be cheaper for her that way, please advise?

## 2017-06-12 ENCOUNTER — Other Ambulatory Visit: Payer: Self-pay

## 2017-06-12 MED ORDER — INSULIN GLARGINE 300 UNIT/ML ~~LOC~~ SOPN: 30.0000 [IU] | PEN_INJECTOR | Freq: Every day | SUBCUTANEOUS | 2 refills | Status: DC

## 2017-06-12 MED ORDER — INSULIN PEN NEEDLE 31G X 5 MM MISC: 5 refills | Status: DC

## 2017-06-12 NOTE — Telephone Encounter (Signed)
Rx sent 

## 2017-06-26 ENCOUNTER — Ambulatory Visit: Payer: PPO | Admitting: Nurse Practitioner

## 2017-06-30 DIAGNOSIS — J449 Chronic obstructive pulmonary disease, unspecified: Secondary | ICD-10-CM | POA: Diagnosis not present

## 2017-06-30 DIAGNOSIS — G4730 Sleep apnea, unspecified: Secondary | ICD-10-CM | POA: Diagnosis not present

## 2017-07-09 ENCOUNTER — Other Ambulatory Visit (HOSPITAL_COMMUNITY): Payer: Self-pay | Admitting: Pulmonary Disease

## 2017-07-09 DIAGNOSIS — L97528 Non-pressure chronic ulcer of other part of left foot with other specified severity: Secondary | ICD-10-CM

## 2017-07-09 DIAGNOSIS — E1165 Type 2 diabetes mellitus with hyperglycemia: Secondary | ICD-10-CM | POA: Diagnosis not present

## 2017-07-09 DIAGNOSIS — J449 Chronic obstructive pulmonary disease, unspecified: Secondary | ICD-10-CM | POA: Diagnosis not present

## 2017-07-09 DIAGNOSIS — R079 Chest pain, unspecified: Secondary | ICD-10-CM | POA: Diagnosis not present

## 2017-07-11 ENCOUNTER — Ambulatory Visit (HOSPITAL_COMMUNITY)
Admission: RE | Admit: 2017-07-11 | Discharge: 2017-07-11 | Disposition: A | Discharge status: Home / Self Care | Payer: PPO | Source: Ambulatory Visit | Attending: Pulmonary Disease | Admitting: Pulmonary Disease

## 2017-07-11 DIAGNOSIS — E118 Type 2 diabetes mellitus with unspecified complications: Secondary | ICD-10-CM | POA: Diagnosis not present

## 2017-07-11 DIAGNOSIS — Z794 Long term (current) use of insulin: Secondary | ICD-10-CM | POA: Diagnosis not present

## 2017-07-11 DIAGNOSIS — E1165 Type 2 diabetes mellitus with hyperglycemia: Secondary | ICD-10-CM | POA: Diagnosis not present

## 2017-07-11 DIAGNOSIS — L97528 Non-pressure chronic ulcer of other part of left foot with other specified severity: Secondary | ICD-10-CM | POA: Diagnosis not present

## 2017-07-11 DIAGNOSIS — Z87828 Personal history of other (healed) physical injury and trauma: Secondary | ICD-10-CM | POA: Diagnosis not present

## 2017-07-11 DIAGNOSIS — E038 Other specified hypothyroidism: Secondary | ICD-10-CM | POA: Diagnosis not present

## 2017-07-12 LAB — COMPLETE METABOLIC PANEL WITH GFR
AG RATIO: 1.2 (calc) (ref 1.0–2.5)
ALT: 8 U/L (ref 6–29)
AST: 9 U/L — ABNORMAL LOW (ref 10–35)
Albumin: 3.6 g/dL (ref 3.6–5.1)
Alkaline phosphatase (APISO): 48 U/L (ref 33–130)
BILIRUBIN TOTAL: 0.3 mg/dL (ref 0.2–1.2)
BUN: 14 mg/dL (ref 7–25)
CHLORIDE: 101 mmol/L (ref 98–110)
CO2: 29 mmol/L (ref 20–32)
Calcium: 9.3 mg/dL (ref 8.6–10.4)
Creat: 0.87 mg/dL (ref 0.50–0.99)
GFR, Est African American: 82 mL/min/{1.73_m2} (ref >=60)
GFR, Est Non African American: 70 mL/min/{1.73_m2} (ref >=60)
GLOBULIN: 3.1 g/dL (ref 1.9–3.7)
Glucose, Bld: 125 mg/dL — ABNORMAL HIGH (ref 65–99)
POTASSIUM: 5.2 mmol/L (ref 3.5–5.3)
SODIUM: 140 mmol/L (ref 135–146)
TOTAL PROTEIN: 6.7 g/dL (ref 6.1–8.1)

## 2017-07-12 LAB — HEMOGLOBIN A1C
EAG (MMOL/L): 9.3 (calc)
Hgb A1c MFr Bld: 7.5 % of total Hgb — ABNORMAL HIGH (ref <5.7)
Mean Plasma Glucose: 169 (calc)

## 2017-07-12 LAB — TSH: TSH: 3.71 mIU/L (ref 0.40–4.50)

## 2017-07-12 LAB — T4, FREE: Free T4: 1.4 ng/dL (ref 0.8–1.8)

## 2017-07-21 ENCOUNTER — Ambulatory Visit: Payer: PPO | Admitting: "Endocrinology

## 2017-07-21 DIAGNOSIS — L97522 Non-pressure chronic ulcer of other part of left foot with fat layer exposed: Secondary | ICD-10-CM | POA: Diagnosis not present

## 2017-07-21 DIAGNOSIS — M21612 Bunion of left foot: Secondary | ICD-10-CM | POA: Diagnosis not present

## 2017-08-04 ENCOUNTER — Other Ambulatory Visit: Payer: Self-pay | Admitting: Neurology

## 2017-08-13 ENCOUNTER — Ambulatory Visit: Payer: PPO | Admitting: "Endocrinology

## 2017-08-15 ENCOUNTER — Ambulatory Visit: Payer: Self-pay | Admitting: Cardiology

## 2017-08-15 NOTE — Progress Notes (Deleted)
Clinical Summary Norma [name] is a 67 y.o.female  seen today for follow up of the following medical problems.   1. Chest pain - nuclear stress 2007 without clear ischemia.  - symptoms started in early August. Cramping pain epigastric area that spreads throughout chest into both arms and left side of neck. 10/10 in severity. Occurs at rest or with exertion. Can have some SOB. Can be worst with laying down. No clear association with eating. - can have some DOE at times, though limited due to chronic back and leg pains. Fairly sedentary lifestyle. - No LE edema. - pain occurs daily, can be up to two times daily  - normal stress test 03/2016 - EGD Jan 2018: erythemaous mucosa  - chest pain initially better with lyrica. Since that time pain has returned - sharp pain midchest, radiates to back. 10/10 in severe. Often occurs at rest. Can have some belching. No SOB. Pain typically lasts just a few minutes. Can last up to 3.5 hours. Better with sitting up. No relation to food.   2. CVA - followed by neuro, noted by MRI Jan 2019 she had for memory changes. Stroke was asymptomatic.    3. Chronic systolicHF - echo 0/9735 LVEF 40-45%, hypokinesis of anteroseptal, inferoseptal, and basal to mid inferior walls. Grade I diastolic dysfunction. - only prior echo LVEF 50-55%   Past Medical History:  Diagnosis Date  . Anemia   . Anxiety   . Bipolar disorder ([place])   . Bulging lumbar disc    L3-4  . Chronic daily headache   . Chronic low back pain 09/20/2014  . COPD (chronic obstructive pulmonary disease) ([place])   . Degenerative arthritis   . Depression   . Diabetes mellitus   . Diabetes mellitus, type II ([place])   . DM type 2 with diabetic peripheral neuropathy ([place]) 09/20/2014  . Dyslipidemia   . Dyspnea   . Gastroparesis   . GERD (gastroesophageal reflux disease)   . Heart murmur   . History of hiatal hernia   . Hypothyroidism   . IBS (irritable bowel syndrome)   . Memory difficulty  09/20/2014  . Morbid obesity ([place])   . Neuropathy   . Obstructive sleep apnea on CPAP   . Restless legs syndrome (RLS) 09/17/2012  . Stroke (cerebrum) ([place]) 05/16/2017   Left parietal     Allergies  Allergen Reactions  . Amoxicillin Anaphylaxis and Other (See Comments)    Has patient had a PCN reaction causing immediate rash, facial/tongue/throat swelling, SOB or lightheadedness with hypotension: Yes Has patient had a PCN reaction causing severe rash involving mucus membranes or skin necrosis: Yes Has patient had a PCN reaction that required hospitalization: Yes Has patient had a PCN reaction occurring within the last 10 years: Yes If all of the above answers are "NO", then may proceed with Cephalosporin use.   [name] Hydrocodone Anaphylaxis  . Hydrocodone-Acetaminophen Other (See Comments)  . Depacon [Valproic Acid] Other (See Comments)    Causes falls      Current Outpatient Medications  Medication Sig Dispense Refill  . albuterol (PROVENTIL HFA;VENTOLIN HFA) 108 (90 Base) MCG/ACT inhaler Inhale 2 puffs into the lungs every 6 (six) hours as needed for wheezing or shortness of breath.    . ASPIRIN 81 PO Take 1 tablet by mouth daily.    . Aspirin-Acetaminophen-Caffeine (EXCEDRIN MIGRAINE PO) Take 2 tablets by mouth daily as needed (pain). Take with tramadol    . baclofen (LIORESAL) 10 MG tablet  TAKE ONE TABLET BY MOUTH AT BEDTIME. 30 tablet 1  . buPROPion (WELLBUTRIN XL) 300 MG 24 hr tablet TAKE ONE TABLET BY MOUTH EVERY MORNING. 30 tablet 2  . Charcoal Activated (CHARCOAL PO) Take 2 capsules by mouth daily as needed (FOR CHEST PAIN).    [name] diclofenac sodium (VOLTAREN) 1 % GEL Apply 1 application topically as needed (pain).     [name] EPINEPHrine (EPIPEN IJ) Inject 1 Dose as directed as needed (allergic reaction).     [name] esomeprazole (NEXIUM) 40 MG capsule Take 40 mg by mouth 2 (two) times daily.  12  . gabapentin (NEURONTIN) 300 MG capsule Take 900 mg by mouth 2 (two) times daily.     . Insulin  Glargine (TOUJEO SOLOSTAR) 300 UNIT/ML SOPN Inject 30 Units into the skin at bedtime.    . Insulin Glargine (TOUJEO SOLOSTAR) 300 UNIT/ML SOPN Inject 30 Units into the skin at bedtime. 13.5 mL 2  . Insulin Pen Needle (LITE TOUCH PEN NEEDLES) 31G X 5 MM MISC USE ONE DAILY. 100 each 5  . lamoTRIgine (LAMICTAL) 150 MG tablet TAKE ONE TABLET BY MOUTH AT BEDTIME. 30 tablet 2  . levothyroxine (SYNTHROID, LEVOTHROID) 100 MCG tablet Take 100 mcg by mouth daily before breakfast.     . loratadine-pseudoephedrine (CLARITIN-D 24-HOUR) 10-240 MG 24 hr tablet Take 1 tablet by mouth at bedtime.    [name] losartan (COZAAR) 50 MG tablet Take 50 mg by mouth at bedtime.     . meclizine (ANTIVERT) 25 MG tablet Take 1 tablet (25 mg total) by mouth 3 (three) times daily as needed for dizziness. (Patient taking differently: Take 25 mg by mouth daily as needed for dizziness. ) 30 tablet 0  . metFORMIN (GLUCOPHAGE) 500 MG tablet Take 2 tablets (1,000 mg total) by mouth 2 (two) times daily with a meal. 60 tablet 3  . oxymetazoline (AFRIN) 0.05 % nasal spray Place 1 spray into both nostrils as needed for congestion.    . promethazine (PHENERGAN) 25 MG tablet Take 25 mg by mouth at bedtime as needed for nausea or vomiting.     [name] rOPINIRole (REQUIP) 2 MG tablet Take 2-4 mg by mouth 2 (two) times daily. Takes 1 tablet in the morning and 2 tablets in the evening    . traMADol (ULTRAM) 50 MG tablet Take 2 tablets (100 mg total) by mouth every 8 (eight) hours as needed for pain. (Patient taking differently: Take 100 mg by mouth every 6 (six) hours as needed for moderate pain (takes at bedtime every night). ) 180 tablet 5  . venlafaxine XR (EFFEXOR-XR) 150 MG 24 hr capsule TAKE TWO (2) CAPSULES BY MOUTH AT BEDTIME. (Patient taking differently: TAKE 1 CAPSULE BY MOUTH TWICE DAILY) 60 capsule 2   No current facility-administered medications for this visit.      Past Surgical History:  Procedure Laterality Date  . ABDOMINAL HYSTERECTOMY     . ARTHROSCOPY KNEE W/ DRILLING  06/2011   and Decemer of 2013.  [name] Carpel tunnel  1980's  . CATARACT EXTRACTION W/PHACO Right 03/21/2017   Procedure: CATARACT EXTRACTION PHACO AND INTRAOCULAR LENS PLACEMENT RIGHT EYE;  Surgeon: Baruch Goldmann, MD;  Location: [facility];  Service: Ophthalmology;  Laterality: Right;  CDE: 2.91   . CATARACT EXTRACTION W/PHACO Left 04/18/2017   Procedure: CATARACT EXTRACTION PHACO AND INTRAOCULAR LENS PLACEMENT LEFT EYE;  Surgeon: Baruch Goldmann, MD;  Location: [facility];  Service: Ophthalmology;  Laterality: Left;  left  . CHOLECYSTECTOMY    .  COLONOSCOPY WITH PROPOFOL N/A 10/04/2016   Procedure: COLONOSCOPY WITH PROPOFOL;  Surgeon: Rogene Houston, MD;  Location: [facility];  Service: Endoscopy;  Laterality: N/A;  11:10  . ESOPHAGOGASTRODUODENOSCOPY N/A 04/25/2016   Procedure: ESOPHAGOGASTRODUODENOSCOPY (EGD);  Surgeon: Rogene Houston, MD;  Location: [facility];  Service: Endoscopy;  Laterality: N/A;  730  . RECTAL SURGERY     fissure  . SHOULDER SURGERY Left    arthroscopy in March of this year     Allergies  Allergen Reactions  . Amoxicillin Anaphylaxis and Other (See Comments)    Has patient had a PCN reaction causing immediate rash, facial/tongue/throat swelling, SOB or lightheadedness with hypotension: Yes Has patient had a PCN reaction causing severe rash involving mucus membranes or skin necrosis: Yes Has patient had a PCN reaction that required hospitalization: Yes Has patient had a PCN reaction occurring within the last 10 years: Yes If all of the above answers are "NO", then may proceed with Cephalosporin use.   [name] Hydrocodone Anaphylaxis  . Hydrocodone-Acetaminophen Other (See Comments)  . Depacon [Valproic Acid] Other (See Comments)    Causes falls       Family History  Problem Relation Age of Onset  . Hypertension Mother   . Lymphoma Mother   . Depression Mother   . Arthritis Father   . Alcohol abuse Father   . Hypertension Sister    . Cancer Brother        kidney and lung  . Alcohol abuse Brother   . Alcohol abuse Paternal Uncle   . Alcohol abuse Paternal Grandfather   . Alcohol abuse Paternal Grandmother      Social History Ms. Pavone reports that she quit smoking about 46 years ago. Her smoking use included cigarettes. She has never used smokeless tobacco. Ms. Fickel reports that she does not drink alcohol.   Review of Systems CONSTITUTIONAL: No weight loss, fever, chills, weakness or fatigue.  HEENT: Eyes: No visual loss, blurred vision, double vision or yellow sclerae.No hearing loss, sneezing, congestion, runny nose or sore throat.  SKIN: No rash or itching.  CARDIOVASCULAR:  RESPIRATORY: No shortness of breath, cough or sputum.  GASTROINTESTINAL: No anorexia, nausea, vomiting or diarrhea. No abdominal pain or blood.  GENITOURINARY: No burning on urination, no polyuria NEUROLOGICAL: No headache, dizziness, syncope, paralysis, ataxia, numbness or tingling in the extremities. No change in bowel or bladder control.  MUSCULOSKELETAL: No muscle, back pain, joint pain or stiffness.  LYMPHATICS: No enlarged nodes. No history of splenectomy.  PSYCHIATRIC: No history of depression or anxiety.  ENDOCRINOLOGIC: No reports of sweating, cold or heat intolerance. No polyuria or polydipsia.  [name]   Physical Examination There were no vitals filed for this visit. There were no vitals filed for this visit.  Gen: resting comfortably, no acute distress HEENT: no scleral icterus, pupils equal round and reactive, no palptable cervical adenopathy,  CV Resp: Clear to auscultation bilaterally GI: abdomen is soft, non-tender, non-distended, normal bowel sounds, no hepatosplenomegaly MSK: extremities are warm, no edema.  Skin: warm, no rash Neuro:  no focal deficits Psych: appropriate affect   Diagnostic Studies 03/2016 Lexiscan  Probable normal perfusion and mild soft tissue attenuation (breast) No significant ischemia  or evidence for scar  The left ventricular ejection fraction is normal (55-65%).  Low risk scan.   05/2017 echo Study Conclusions  - Left ventricle: The cavity size was normal. There was mild   concentric hypertrophy. Systolic function was mildly to   moderately  reduced. The estimated ejection fraction was in the   range of 40% to 45%. Hypokinesis of the anteroseptal,   inferoseptal and basal to mid inferior myocardium. Doppler   parameters are consistent with abnormal left ventricular   relaxation (grade 1 diastolic dysfunction). Doppler parameters   are consistent with indeterminate ventricular filling pressure. - Aortic valve: Transvalvular velocity was within the normal range.   There was no stenosis. There was no regurgitation. - Mitral valve: Transvalvular velocity was within the normal range.   There was no evidence for stenosis. There was no regurgitation. - Right ventricle: The cavity size was normal. Wall thickness was   normal. Systolic function was normal. - Tricuspid valve: There was trivial regurgitation. - Global longitudinal strain -11.9%.  05/2017 carotid US Final Interpretation: Right Carotid: Velocities in the right ICA are consistent with a 1-39% stenosis.  Left Carotid: Velocities in the left ICA are consistent with a 1-39% stenosis.  Vertebrals: Both vertebral arteries were patent with antegrade flow. Subclavians: Normal flow hemodynamics were seen in bilateral subclavian    Assessment and Plan  1. Atypical hest pain - negative stress test - continue to monitor at this time. She has asked for a referral to GI.    2. LBBB - negative stress test recently, no evidence of ischemic etiology at this time.        Arnoldo Lenis, M.D., F.A.C.C.

## 2017-08-22 ENCOUNTER — Other Ambulatory Visit (HOSPITAL_COMMUNITY)
Admission: RE | Admit: 2017-08-22 | Discharge: 2017-08-22 | Disposition: A | Discharge status: Home / Self Care | Payer: PPO | Source: Ambulatory Visit | Attending: Cardiology | Admitting: Cardiology

## 2017-08-22 ENCOUNTER — Ambulatory Visit: Payer: PPO | Admitting: Cardiology

## 2017-08-22 ENCOUNTER — Ambulatory Visit (HOSPITAL_COMMUNITY)
Admission: RE | Admit: 2017-08-22 | Discharge: 2017-08-22 | Disposition: A | Discharge status: Home / Self Care | Payer: PPO | Source: Ambulatory Visit | Attending: Cardiology | Admitting: Cardiology

## 2017-08-22 ENCOUNTER — Encounter: Payer: Self-pay | Admitting: Cardiology

## 2017-08-22 DIAGNOSIS — Z01810 Encounter for preprocedural cardiovascular examination: Secondary | ICD-10-CM | POA: Insufficient documentation

## 2017-08-22 DIAGNOSIS — R079 Chest pain, unspecified: Secondary | ICD-10-CM

## 2017-08-22 DIAGNOSIS — Z794 Long term (current) use of insulin: Secondary | ICD-10-CM | POA: Diagnosis not present

## 2017-08-22 DIAGNOSIS — I429 Cardiomyopathy, unspecified: Secondary | ICD-10-CM

## 2017-08-22 DIAGNOSIS — I63132 Cerebral infarction due to embolism of left carotid artery: Secondary | ICD-10-CM

## 2017-08-22 DIAGNOSIS — E119 Type 2 diabetes mellitus without complications: Secondary | ICD-10-CM | POA: Diagnosis not present

## 2017-08-22 DIAGNOSIS — I447 Left bundle-branch block, unspecified: Secondary | ICD-10-CM

## 2017-08-22 DIAGNOSIS — IMO0001 Reserved for inherently not codable concepts without codable children: Secondary | ICD-10-CM

## 2017-08-22 DIAGNOSIS — Z01818 Encounter for other preprocedural examination: Secondary | ICD-10-CM | POA: Diagnosis not present

## 2017-08-22 LAB — CBC WITH DIFFERENTIAL/PLATELET
Basophils Absolute: 0 10*3/uL (ref 0.0–0.1)
Basophils Relative: 0 %
Eosinophils Absolute: 0.4 10*3/uL (ref 0.0–0.7)
Eosinophils Relative: 3 %
HCT: 37.4 % (ref 36.0–46.0)
Hemoglobin: 11.9 g/dL — ABNORMAL LOW (ref 12.0–15.0)
Lymphocytes Relative: 14 %
Lymphs Abs: 1.7 10*3/uL (ref 0.7–4.0)
MCH: 28.5 pg (ref 26.0–34.0)
MCHC: 31.8 g/dL (ref 30.0–36.0)
MCV: 89.5 fL (ref 78.0–100.0)
Monocytes Absolute: 0.9 10*3/uL (ref 0.1–1.0)
Monocytes Relative: 7 %
Neutro Abs: 9 10*3/uL — ABNORMAL HIGH (ref 1.7–7.7)
Neutrophils Relative %: 76 %
Platelets: 413 10*3/uL — ABNORMAL HIGH (ref 150–400)
RBC: 4.18 MIL/uL (ref 3.87–5.11)
RDW: 14.3 % (ref 11.5–15.5)
WBC: 11.9 10*3/uL — ABNORMAL HIGH (ref 4.0–10.5)

## 2017-08-22 LAB — PROTIME-INR
INR: 0.9
Prothrombin Time: 12 seconds (ref 11.4–15.2)

## 2017-08-22 LAB — BASIC METABOLIC PANEL
Anion gap: 8 (ref 5–15)
BUN: 15 mg/dL (ref 6–20)
CO2: 29 mmol/L (ref 22–32)
Calcium: 9.1 mg/dL (ref 8.9–10.3)
Chloride: 101 mmol/L (ref 101–111)
Creatinine, Ser: 0.84 mg/dL (ref 0.44–1.00)
GFR calc Af Amer: >60 mL/min (ref >60)
GFR calc non Af Amer: >60 mL/min (ref >60)
Glucose, Bld: 233 mg/dL — ABNORMAL HIGH (ref 65–99)
Potassium: 4.2 mmol/L (ref 3.5–5.1)
Sodium: 138 mmol/L (ref 135–145)

## 2017-08-22 NOTE — H&P (View-Only) (Signed)
08/22/2017 Norma [name]   Apr 13, 1951  [id number]  Primary Physician Sinda Du, MD Primary Cardiologist: Dr Harl Bowie  HPI:  67 y/o obese female with a history of prio chest pain evaluation with a negative Myoview in Dec 2017. Her LVF was normal then-55-60% by Myoview. Other ,medical problems include DM, HTN, HLD, LBBB, Bipolar disorder, and recent diagnosis of Lt parietal CVA by MRI Jan 2019. Echo done Feb 2019 showed LVD-EF 40-45% with AS and inferior WMA. The pt does have chest pain, hard for her to describe but she feels its is related to activity.   Current Outpatient Medications  Medication Sig Dispense Refill  . albuterol (PROVENTIL HFA;VENTOLIN HFA) 108 (90 Base) MCG/ACT inhaler Inhale 2 puffs into the lungs every 6 (six) hours as needed for wheezing or shortness of breath.    . ASPIRIN 81 PO Take 1 tablet by mouth daily.    . Aspirin-Acetaminophen-Caffeine (EXCEDRIN MIGRAINE PO) Take 2 tablets by mouth daily as needed (pain). Take with tramadol    . baclofen (LIORESAL) 10 MG tablet TAKE ONE TABLET BY MOUTH AT BEDTIME. 30 tablet 1  . buPROPion (WELLBUTRIN XL) 300 MG 24 hr tablet TAKE ONE TABLET BY MOUTH EVERY MORNING. 30 tablet 2  . Charcoal Activated (CHARCOAL PO) Take 2 capsules by mouth daily as needed (FOR CHEST PAIN).    [name] diclofenac sodium (VOLTAREN) 1 % GEL Apply 1 application topically as needed (pain).     [name] EPINEPHrine (EPIPEN IJ) Inject 1 Dose as directed as needed (allergic reaction).     [name] esomeprazole (NEXIUM) 40 MG capsule Take 40 mg by mouth 2 (two) times daily.  12  . furosemide (LASIX) 40 MG tablet Take 40 mg by mouth daily as needed.  12  . gabapentin (NEURONTIN) 300 MG capsule Take 900 mg by mouth 2 (two) times daily.     . Insulin Glargine (TOUJEO SOLOSTAR) 300 UNIT/ML SOPN Inject 30 Units into the skin at bedtime.    . Insulin Glargine (TOUJEO SOLOSTAR) 300 UNIT/ML SOPN Inject 30 Units into the skin at bedtime. 13.5 mL 2  . Insulin Pen Needle (LITE  TOUCH PEN NEEDLES) 31G X 5 MM MISC USE ONE DAILY. 100 each 5  . lamoTRIgine (LAMICTAL) 150 MG tablet TAKE ONE TABLET BY MOUTH AT BEDTIME. 30 tablet 2  . levothyroxine (SYNTHROID, LEVOTHROID) 100 MCG tablet Take 100 mcg by mouth daily before breakfast.     . loratadine-pseudoephedrine (CLARITIN-D 24-HOUR) 10-240 MG 24 hr tablet Take 1 tablet by mouth at bedtime.    [name] losartan (COZAAR) 50 MG tablet Take 50 mg by mouth at bedtime.     . meclizine (ANTIVERT) 25 MG tablet Take 1 tablet (25 mg total) by mouth 3 (three) times daily as needed for dizziness. (Patient taking differently: Take 25 mg by mouth daily as needed for dizziness. ) 30 tablet 0  . metFORMIN (GLUCOPHAGE) 500 MG tablet Take 2 tablets (1,000 mg total) by mouth 2 (two) times daily with a meal. 60 tablet 3  . oxymetazoline (AFRIN) 0.05 % nasal spray Place 1 spray into both nostrils as needed for congestion.    . promethazine (PHENERGAN) 25 MG tablet Take 25 mg by mouth at bedtime as needed for nausea or vomiting.     [name] rOPINIRole (REQUIP) 2 MG tablet Take 2-4 mg by mouth 2 (two) times daily. Takes 1 tablet in the morning and 2 tablets in the evening    . traMADol (ULTRAM) 50 MG tablet Take 2  tablets (100 mg total) by mouth every 8 (eight) hours as needed for pain. (Patient taking differently: Take 100 mg by mouth every 6 (six) hours as needed for moderate pain (takes at bedtime every night). ) 180 tablet 5  . traZODone (DESYREL) 50 MG tablet Take 50 mg by mouth at bedtime.  5  . venlafaxine XR (EFFEXOR-XR) 150 MG 24 hr capsule TAKE TWO (2) CAPSULES BY MOUTH AT BEDTIME. (Patient taking differently: TAKE 1 CAPSULE BY MOUTH TWICE DAILY) 60 capsule 2   No current facility-administered medications for this visit.     Allergies  Allergen Reactions  . Amoxicillin Anaphylaxis and Other (See Comments)    Has patient had a PCN reaction causing immediate rash, facial/tongue/throat swelling, SOB or lightheadedness with hypotension: Yes Has patient  had a PCN reaction causing severe rash involving mucus membranes or skin necrosis: Yes Has patient had a PCN reaction that required hospitalization: Yes Has patient had a PCN reaction occurring within the last 10 years: Yes If all of the above answers are "NO", then may proceed with Cephalosporin use.   [name] Hydrocodone Anaphylaxis  . Hydrocodone-Acetaminophen Other (See Comments)  . Depacon [Valproic Acid] Other (See Comments)    Causes falls     Past Medical History:  Diagnosis Date  . Anemia   . Anxiety   . Bipolar disorder ([place])   . Bulging lumbar disc    L3-4  . Chronic daily headache   . Chronic low back pain 09/20/2014  . COPD (chronic obstructive pulmonary disease) ([place])   . Degenerative arthritis   . Depression   . Diabetes mellitus   . Diabetes mellitus, type II ([place])   . DM type 2 with diabetic peripheral neuropathy ([place]) 09/20/2014  . Dyslipidemia   . Dyspnea   . Gastroparesis   . GERD (gastroesophageal reflux disease)   . Heart murmur   . History of hiatal hernia   . Hypothyroidism   . IBS (irritable bowel syndrome)   . Memory difficulty 09/20/2014  . Morbid obesity ([place])   . Neuropathy   . Obstructive sleep apnea on CPAP   . Restless legs syndrome (RLS) 09/17/2012  . Stroke (cerebrum) ([place]) 05/16/2017   Left parietal    Social History   Socioeconomic History  . Marital status: Single    Spouse name: Not on file  . Number of children: 0  . Years of education: 42  . Highest education level: Not on file  Occupational History    Employer: [employer]  . Financial resource strain: Not on file  . Food insecurity:    Worry: Not on file    Inability: Not on file  . Transportation needs:    Medical: Not on file    Non-medical: Not on file  Tobacco Use  . Smoking status: Former Smoker    Types: Cigarettes    Last attempt to quit: 02/04/1971    Years since quitting: 46.5  . Smokeless tobacco: Never Used  . Tobacco comment: smoked 2  cigarettes a day  Substance and Sexual Activity  . Alcohol use: No    Alcohol/week: 0.0 oz  . Drug use: No  . Sexual activity: Never  Lifestyle  . Physical activity:    Days per week: Not on file    Minutes per session: Not on file  . Stress: Not on file  Relationships  . Social connections:    Talks on phone: Not on file    Gets together:  Not on file    Attends religious service: Not on file    Active member of club or organization: Not on file    Attends meetings of clubs or organizations: Not on file    Relationship status: Not on file  . Intimate partner violence:    Fear of current or ex partner: Not on file    Emotionally abused: Not on file    Physically abused: Not on file    Forced sexual activity: Not on file  Other Topics Concern  . Not on file  Social History Narrative   Patient lives at home alone.    Patient has no children.    Patient has her masters in nursing.    Patient is single.    Patient drinks about 2 glasses of tea daily.   Patient is right handed.     Family History  Problem Relation Age of Onset  . Hypertension Mother   . Lymphoma Mother   . Depression Mother   . Arthritis Father   . Alcohol abuse Father   . Hypertension Sister   . Cancer Brother        kidney and lung  . Alcohol abuse Brother   . Alcohol abuse Paternal Uncle   . Alcohol abuse Paternal Grandfather   . Alcohol abuse Paternal Grandmother      Review of Systems: General: negative for chills, fever, night sweats or weight changes.  Cardiovascular: negative for chest pain, dyspnea on exertion, edema, orthopnea, palpitations, paroxysmal nocturnal dyspnea or shortness of breath Dermatological: negative for rash Respiratory: negative for cough or wheezing Urologic: negative for hematuria Abdominal: negative for nausea, vomiting, diarrhea, bright red blood per rectum, melena, or hematemesis Neurologic: negative for visual changes, syncope, or dizziness All other systems  reviewed and are otherwise negative except as noted above.    Blood pressure 128/74, pulse (!) 107, height 5\' 3"  (1.6 m), weight 256 lb (116.1 kg), SpO2 98 %.  General appearance: alert, cooperative, no distress and morbidly obese Neck: no carotid bruit and no JVD Lungs: clear to auscultation bilaterally Heart: regular rate and rhythm and short 2/6 systolic murmur AOV and LSB Abdomen: obese, non tender Extremities: 1+ pedal edema Pulses: Rt radial pulse 2+ Skin: Skin color, texture, turgor normal. No rashes or lesions Neurologic: Grossly normal  EKG NSR, LBBB  ASSESSMENT AND PLAN:   Chest pain with moderate risk for cardiac etiology Low risk Myoview in 2017 but now with abnormal LVF by echo and history of exertional chest pain.  Cardiomyopathy ([place]) EF 40-45% with AS, inferior HK by echo Jan 2019  LBBB (left bundle branch block) Chronic  Stroke (cerebrum) ([place]) Left parietal noted on MRI Jan 2019  Insulin dependent diabetes mellitus ([place]) .  Morbid obesity, unspecified obesity type ([place]) BMI 45   PLAN  Discussed with Dr Harl Bowie- plan OP diagnostic cath next week. The patient understands that risks included but are not limited to stroke (1 in 1000), death (1 in 52), kidney failure [usually temporary] (1 in 500), bleeding (1 in 200), allergic reaction [possibly serious] (1 in 200).  The patient understands and agrees to proceed.   Kerin Ransom PA-C 08/22/2017 1:36 PM

## 2017-08-22 NOTE — Assessment & Plan Note (Signed)
EF 40-45% with AS, inferior HK by echo Jan 2019

## 2017-08-22 NOTE — Progress Notes (Signed)
08/22/2017 Norma [name]   1950-10-22  [id number]  Primary Physician Sinda Du, MD Primary Cardiologist: Dr Harl Bowie  HPI:  67 y/o obese female with a history of prio chest pain evaluation with a negative Myoview in Dec 2017. Her LVF was normal then-55-60% by Myoview. Other ,medical problems include DM, HTN, HLD, LBBB, Bipolar disorder, and recent diagnosis of Lt parietal CVA by MRI Jan 2019. Echo done Feb 2019 showed LVD-EF 40-45% with AS and inferior WMA. The pt does have chest pain, hard for her to describe but she feels its is related to activity.   Current Outpatient Medications  Medication Sig Dispense Refill  . albuterol (PROVENTIL HFA;VENTOLIN HFA) 108 (90 Base) MCG/ACT inhaler Inhale 2 puffs into the lungs every 6 (six) hours as needed for wheezing or shortness of breath.    . ASPIRIN 81 PO Take 1 tablet by mouth daily.    . Aspirin-Acetaminophen-Caffeine (EXCEDRIN MIGRAINE PO) Take 2 tablets by mouth daily as needed (pain). Take with tramadol    . baclofen (LIORESAL) 10 MG tablet TAKE ONE TABLET BY MOUTH AT BEDTIME. 30 tablet 1  . buPROPion (WELLBUTRIN XL) 300 MG 24 hr tablet TAKE ONE TABLET BY MOUTH EVERY MORNING. 30 tablet 2  . Charcoal Activated (CHARCOAL PO) Take 2 capsules by mouth daily as needed (FOR CHEST PAIN).    [name] diclofenac sodium (VOLTAREN) 1 % GEL Apply 1 application topically as needed (pain).     [name] EPINEPHrine (EPIPEN IJ) Inject 1 Dose as directed as needed (allergic reaction).     [name] esomeprazole (NEXIUM) 40 MG capsule Take 40 mg by mouth 2 (two) times daily.  12  . furosemide (LASIX) 40 MG tablet Take 40 mg by mouth daily as needed.  12  . gabapentin (NEURONTIN) 300 MG capsule Take 900 mg by mouth 2 (two) times daily.     . Insulin Glargine (TOUJEO SOLOSTAR) 300 UNIT/ML SOPN Inject 30 Units into the skin at bedtime.    . Insulin Glargine (TOUJEO SOLOSTAR) 300 UNIT/ML SOPN Inject 30 Units into the skin at bedtime. 13.5 mL 2  . Insulin Pen Needle (LITE  TOUCH PEN NEEDLES) 31G X 5 MM MISC USE ONE DAILY. 100 each 5  . lamoTRIgine (LAMICTAL) 150 MG tablet TAKE ONE TABLET BY MOUTH AT BEDTIME. 30 tablet 2  . levothyroxine (SYNTHROID, LEVOTHROID) 100 MCG tablet Take 100 mcg by mouth daily before breakfast.     . loratadine-pseudoephedrine (CLARITIN-D 24-HOUR) 10-240 MG 24 hr tablet Take 1 tablet by mouth at bedtime.    [name] losartan (COZAAR) 50 MG tablet Take 50 mg by mouth at bedtime.     . meclizine (ANTIVERT) 25 MG tablet Take 1 tablet (25 mg total) by mouth 3 (three) times daily as needed for dizziness. (Patient taking differently: Take 25 mg by mouth daily as needed for dizziness. ) 30 tablet 0  . metFORMIN (GLUCOPHAGE) 500 MG tablet Take 2 tablets (1,000 mg total) by mouth 2 (two) times daily with a meal. 60 tablet 3  . oxymetazoline (AFRIN) 0.05 % nasal spray Place 1 spray into both nostrils as needed for congestion.    . promethazine (PHENERGAN) 25 MG tablet Take 25 mg by mouth at bedtime as needed for nausea or vomiting.     [name] rOPINIRole (REQUIP) 2 MG tablet Take 2-4 mg by mouth 2 (two) times daily. Takes 1 tablet in the morning and 2 tablets in the evening    . traMADol (ULTRAM) 50 MG tablet Take 2  tablets (100 mg total) by mouth every 8 (eight) hours as needed for pain. (Patient taking differently: Take 100 mg by mouth every 6 (six) hours as needed for moderate pain (takes at bedtime every night). ) 180 tablet 5  . traZODone (DESYREL) 50 MG tablet Take 50 mg by mouth at bedtime.  5  . venlafaxine XR (EFFEXOR-XR) 150 MG 24 hr capsule TAKE TWO (2) CAPSULES BY MOUTH AT BEDTIME. (Patient taking differently: TAKE 1 CAPSULE BY MOUTH TWICE DAILY) 60 capsule 2   No current facility-administered medications for this visit.     Allergies  Allergen Reactions  . Amoxicillin Anaphylaxis and Other (See Comments)    Has patient had a PCN reaction causing immediate rash, facial/tongue/throat swelling, SOB or lightheadedness with hypotension: Yes Has patient  had a PCN reaction causing severe rash involving mucus membranes or skin necrosis: Yes Has patient had a PCN reaction that required hospitalization: Yes Has patient had a PCN reaction occurring within the last 10 years: Yes If all of the above answers are "NO", then may proceed with Cephalosporin use.   [name] Hydrocodone Anaphylaxis  . Hydrocodone-Acetaminophen Other (See Comments)  . Depacon [Valproic Acid] Other (See Comments)    Causes falls     Past Medical History:  Diagnosis Date  . Anemia   . Anxiety   . Bipolar disorder ([place])   . Bulging lumbar disc    L3-4  . Chronic daily headache   . Chronic low back pain 09/20/2014  . COPD (chronic obstructive pulmonary disease) ([place])   . Degenerative arthritis   . Depression   . Diabetes mellitus   . Diabetes mellitus, type II ([place])   . DM type 2 with diabetic peripheral neuropathy ([place]) 09/20/2014  . Dyslipidemia   . Dyspnea   . Gastroparesis   . GERD (gastroesophageal reflux disease)   . Heart murmur   . History of hiatal hernia   . Hypothyroidism   . IBS (irritable bowel syndrome)   . Memory difficulty 09/20/2014  . Morbid obesity ([place])   . Neuropathy   . Obstructive sleep apnea on CPAP   . Restless legs syndrome (RLS) 09/17/2012  . Stroke (cerebrum) ([place]) 05/16/2017   Left parietal    Social History   Socioeconomic History  . Marital status: Single    Spouse name: Not on file  . Number of children: 0  . Years of education: 80  . Highest education level: Not on file  Occupational History    Employer: [employer]  . Financial resource strain: Not on file  . Food insecurity:    Worry: Not on file    Inability: Not on file  . Transportation needs:    Medical: Not on file    Non-medical: Not on file  Tobacco Use  . Smoking status: Former Smoker    Types: Cigarettes    Last attempt to quit: 02/04/1971    Years since quitting: 46.5  . Smokeless tobacco: Never Used  . Tobacco comment: smoked 2  cigarettes a day  Substance and Sexual Activity  . Alcohol use: No    Alcohol/week: 0.0 oz  . Drug use: No  . Sexual activity: Never  Lifestyle  . Physical activity:    Days per week: Not on file    Minutes per session: Not on file  . Stress: Not on file  Relationships  . Social connections:    Talks on phone: Not on file    Gets together:  Not on file    Attends religious service: Not on file    Active member of club or organization: Not on file    Attends meetings of clubs or organizations: Not on file    Relationship status: Not on file  . Intimate partner violence:    Fear of current or ex partner: Not on file    Emotionally abused: Not on file    Physically abused: Not on file    Forced sexual activity: Not on file  Other Topics Concern  . Not on file  Social History Narrative   Patient lives at home alone.    Patient has no children.    Patient has her masters in nursing.    Patient is single.    Patient drinks about 2 glasses of tea daily.   Patient is right handed.     Family History  Problem Relation Age of Onset  . Hypertension Mother   . Lymphoma Mother   . Depression Mother   . Arthritis Father   . Alcohol abuse Father   . Hypertension Sister   . Cancer Brother        kidney and lung  . Alcohol abuse Brother   . Alcohol abuse Paternal Uncle   . Alcohol abuse Paternal Grandfather   . Alcohol abuse Paternal Grandmother      Review of Systems: General: negative for chills, fever, night sweats or weight changes.  Cardiovascular: negative for chest pain, dyspnea on exertion, edema, orthopnea, palpitations, paroxysmal nocturnal dyspnea or shortness of breath Dermatological: negative for rash Respiratory: negative for cough or wheezing Urologic: negative for hematuria Abdominal: negative for nausea, vomiting, diarrhea, bright red blood per rectum, melena, or hematemesis Neurologic: negative for visual changes, syncope, or dizziness All other systems  reviewed and are otherwise negative except as noted above.    Blood pressure 128/74, pulse (!) 107, height 5\' 3"  (1.6 m), weight 256 lb (116.1 kg), SpO2 98 %.  General appearance: alert, cooperative, no distress and morbidly obese Neck: no carotid bruit and no JVD Lungs: clear to auscultation bilaterally Heart: regular rate and rhythm and short 2/6 systolic murmur AOV and LSB Abdomen: obese, non tender Extremities: 1+ pedal edema Pulses: Rt radial pulse 2+ Skin: Skin color, texture, turgor normal. No rashes or lesions Neurologic: Grossly normal  EKG NSR, LBBB  ASSESSMENT AND PLAN:   Chest pain with moderate risk for cardiac etiology Low risk Myoview in 2017 but now with abnormal LVF by echo and history of exertional chest pain.  Cardiomyopathy ([place]) EF 40-45% with AS, inferior HK by echo Jan 2019  LBBB (left bundle branch block) Chronic  Stroke (cerebrum) ([place]) Left parietal noted on MRI Jan 2019  Insulin dependent diabetes mellitus ([place]) .  Morbid obesity, unspecified obesity type ([place]) BMI 45   PLAN  Discussed with Dr Harl Bowie- plan OP diagnostic cath next week. The patient understands that risks included but are not limited to stroke (1 in 1000), death (1 in 70), kidney failure [usually temporary] (1 in 500), bleeding (1 in 200), allergic reaction [possibly serious] (1 in 200).  The patient understands and agrees to proceed.   Kerin Ransom PA-C 08/22/2017 1:36 PM

## 2017-08-22 NOTE — Patient Instructions (Addendum)
   [place] [id number] Dept: [phone] Loc: [phone]  Norma [name]  08/22/2017  You are scheduled for a Cardiac Catheterization on Wednesday, June 15 with Dr. Sherren Mocha.  1. Please arrive at the [facility] (Main Entrance A) at [facility]: [address] [place], [place] [address] at 8:00 AM (two hours before your procedure to ensure your preparation). Free valet parking service is available.   Special note: Every effort is made to have your procedure done on time. Please understand that emergencies sometimes delay scheduled procedures.  2. Diet:Nothing to eat or drink after midnight  3. Labs: lab work and chest x-ray today  4. Medication instructions in preparation for your procedure:    HOLD METFORMIN THE DAY BEFORE TEST, THE DAY OF, AND 2 DAYS AFTER TEST  HOLD LASIX THE MORNING OF TEST     Take only 15 units of insulin the night before your procedure. Do not take any insulin on the day of the procedure.  On the morning of your procedure, take your Aspirin and any morning medicines NOT listed above.  You may use sips of water.  5. Plan for one night stay--bring personal belongings. 6. Bring a current list of your medications and current insurance cards. 7. You MUST have a responsible person to drive you home. 8. Someone MUST be with you the first 24 hours after you arrive home or your discharge will be delayed. 9. Please wear clothes that are easy to get on and off and wear slip-on shoes.  Thank you for allowing [place] to care for you!   --  Invasive Cardiovascular services

## 2017-08-22 NOTE — Assessment & Plan Note (Signed)
BMI 45 

## 2017-08-22 NOTE — Assessment & Plan Note (Signed)
Chronic. 

## 2017-08-22 NOTE — Assessment & Plan Note (Signed)
Left parietal noted on MRI Jan 2019

## 2017-08-22 NOTE — Assessment & Plan Note (Signed)
Low risk Myoview in 2017 but now with abnormal LVF by echo and history of exertional chest pain.

## 2017-08-26 ENCOUNTER — Telehealth: Payer: Self-pay | Admitting: *Deleted

## 2017-08-26 DIAGNOSIS — M79676 Pain in unspecified toe(s): Secondary | ICD-10-CM | POA: Diagnosis not present

## 2017-08-26 DIAGNOSIS — B351 Tinea unguium: Secondary | ICD-10-CM | POA: Diagnosis not present

## 2017-08-26 DIAGNOSIS — L84 Corns and callosities: Secondary | ICD-10-CM | POA: Diagnosis not present

## 2017-08-26 DIAGNOSIS — E1142 Type 2 diabetes mellitus with diabetic polyneuropathy: Secondary | ICD-10-CM | POA: Diagnosis not present

## 2017-08-26 NOTE — Telephone Encounter (Signed)
Pt contacted pre-catheterization scheduled at [facility] for: Wednesday Aug 27, 2017 10 AM Verified arrival time and place: [place] Entrance A at: 8 AM  No solid food after midnight prior to cath, clear liquids until 5 AM day of procedure. Verified allergies in Epic.  Hold: Metformin 08/27/17 and 48 hours post procedure. Insulin 1/2 of usual evening dose prior to procedure.  Furosemide AM of procedure.  AM meds can be  taken pre-cath with sip of water including: ASA 81 mg   Confirmed patient has responsible person to drive home post procedure and observe patient for 24 hours: yes

## 2017-08-27 ENCOUNTER — Inpatient Hospital Stay (HOSPITAL_COMMUNITY)
Admission: RE | Disposition: A | Discharge status: Skilled Nursing Facility | Payer: Self-pay | Source: Ambulatory Visit | Attending: Cardiothoracic Surgery

## 2017-08-27 ENCOUNTER — Encounter (HOSPITAL_COMMUNITY): Payer: Self-pay | Admitting: Cardiovascular Disease

## 2017-08-27 ENCOUNTER — Inpatient Hospital Stay (HOSPITAL_COMMUNITY)
Admission: RE | Admit: 2017-08-27 | Discharge: 2017-09-05 | DRG: 234 | Disposition: A | Discharge status: Skilled Nursing Facility | Payer: PPO | Source: Ambulatory Visit | Attending: Cardiothoracic Surgery | Admitting: Cardiothoracic Surgery

## 2017-08-27 DIAGNOSIS — G4733 Obstructive sleep apnea (adult) (pediatric): Secondary | ICD-10-CM | POA: Diagnosis not present

## 2017-08-27 DIAGNOSIS — E039 Hypothyroidism, unspecified: Secondary | ICD-10-CM | POA: Diagnosis present

## 2017-08-27 DIAGNOSIS — G2581 Restless legs syndrome: Secondary | ICD-10-CM | POA: Diagnosis not present

## 2017-08-27 DIAGNOSIS — D72829 Elevated white blood cell count, unspecified: Secondary | ICD-10-CM

## 2017-08-27 DIAGNOSIS — Y839 Surgical procedure, unspecified as the cause of abnormal reaction of the patient, or of later complication, without mention of misadventure at the time of the procedure: Secondary | ICD-10-CM | POA: Diagnosis not present

## 2017-08-27 DIAGNOSIS — F419 Anxiety disorder, unspecified: Secondary | ICD-10-CM | POA: Diagnosis not present

## 2017-08-27 DIAGNOSIS — Z79899 Other long term (current) drug therapy: Secondary | ICD-10-CM

## 2017-08-27 DIAGNOSIS — K3184 Gastroparesis: Secondary | ICD-10-CM | POA: Diagnosis present

## 2017-08-27 DIAGNOSIS — I429 Cardiomyopathy, unspecified: Secondary | ICD-10-CM | POA: Diagnosis present

## 2017-08-27 DIAGNOSIS — M6281 Muscle weakness (generalized): Secondary | ICD-10-CM | POA: Diagnosis not present

## 2017-08-27 DIAGNOSIS — Z79890 Hormone replacement therapy (postmenopausal): Secondary | ICD-10-CM

## 2017-08-27 DIAGNOSIS — I25118 Atherosclerotic heart disease of native coronary artery with other forms of angina pectoris: Secondary | ICD-10-CM | POA: Diagnosis not present

## 2017-08-27 DIAGNOSIS — I214 Non-ST elevation (NSTEMI) myocardial infarction: Principal | ICD-10-CM | POA: Diagnosis present

## 2017-08-27 DIAGNOSIS — M545 Low back pain: Secondary | ICD-10-CM | POA: Diagnosis not present

## 2017-08-27 DIAGNOSIS — Z01810 Encounter for preprocedural cardiovascular examination: Secondary | ICD-10-CM | POA: Diagnosis not present

## 2017-08-27 DIAGNOSIS — D62 Acute posthemorrhagic anemia: Secondary | ICD-10-CM

## 2017-08-27 DIAGNOSIS — Z4682 Encounter for fitting and adjustment of non-vascular catheter: Secondary | ICD-10-CM | POA: Diagnosis not present

## 2017-08-27 DIAGNOSIS — Z8249 Family history of ischemic heart disease and other diseases of the circulatory system: Secondary | ICD-10-CM

## 2017-08-27 DIAGNOSIS — G4700 Insomnia, unspecified: Secondary | ICD-10-CM | POA: Diagnosis not present

## 2017-08-27 DIAGNOSIS — G8918 Other acute postprocedural pain: Secondary | ICD-10-CM

## 2017-08-27 DIAGNOSIS — I4891 Unspecified atrial fibrillation: Secondary | ICD-10-CM | POA: Diagnosis not present

## 2017-08-27 DIAGNOSIS — Z881 Allergy status to other antibiotic agents status: Secondary | ICD-10-CM | POA: Diagnosis not present

## 2017-08-27 DIAGNOSIS — I447 Left bundle-branch block, unspecified: Secondary | ICD-10-CM | POA: Diagnosis present

## 2017-08-27 DIAGNOSIS — R51 Headache: Secondary | ICD-10-CM | POA: Diagnosis present

## 2017-08-27 DIAGNOSIS — E1142 Type 2 diabetes mellitus with diabetic polyneuropathy: Secondary | ICD-10-CM | POA: Diagnosis present

## 2017-08-27 DIAGNOSIS — K589 Irritable bowel syndrome without diarrhea: Secondary | ICD-10-CM | POA: Diagnosis present

## 2017-08-27 DIAGNOSIS — J449 Chronic obstructive pulmonary disease, unspecified: Secondary | ICD-10-CM | POA: Diagnosis present

## 2017-08-27 DIAGNOSIS — E669 Obesity, unspecified: Secondary | ICD-10-CM

## 2017-08-27 DIAGNOSIS — G8929 Other chronic pain: Secondary | ICD-10-CM | POA: Diagnosis not present

## 2017-08-27 DIAGNOSIS — E0590 Thyrotoxicosis, unspecified without thyrotoxic crisis or storm: Secondary | ICD-10-CM | POA: Diagnosis not present

## 2017-08-27 DIAGNOSIS — E1143 Type 2 diabetes mellitus with diabetic autonomic (poly)neuropathy: Secondary | ICD-10-CM | POA: Diagnosis not present

## 2017-08-27 DIAGNOSIS — E119 Type 2 diabetes mellitus without complications: Secondary | ICD-10-CM

## 2017-08-27 DIAGNOSIS — R079 Chest pain, unspecified: Secondary | ICD-10-CM | POA: Diagnosis present

## 2017-08-27 DIAGNOSIS — I200 Unstable angina: Secondary | ICD-10-CM | POA: Diagnosis present

## 2017-08-27 DIAGNOSIS — Z8673 Personal history of transient ischemic attack (TIA), and cerebral infarction without residual deficits: Secondary | ICD-10-CM

## 2017-08-27 DIAGNOSIS — Z6841 Body Mass Index (BMI) 40.0 and over, adult: Secondary | ICD-10-CM | POA: Diagnosis not present

## 2017-08-27 DIAGNOSIS — I25110 Atherosclerotic heart disease of native coronary artery with unstable angina pectoris: Secondary | ICD-10-CM | POA: Diagnosis not present

## 2017-08-27 DIAGNOSIS — Z951 Presence of aortocoronary bypass graft: Secondary | ICD-10-CM | POA: Diagnosis not present

## 2017-08-27 DIAGNOSIS — E1169 Type 2 diabetes mellitus with other specified complication: Secondary | ICD-10-CM | POA: Diagnosis not present

## 2017-08-27 DIAGNOSIS — K219 Gastro-esophageal reflux disease without esophagitis: Secondary | ICD-10-CM | POA: Diagnosis not present

## 2017-08-27 DIAGNOSIS — E785 Hyperlipidemia, unspecified: Secondary | ICD-10-CM | POA: Diagnosis present

## 2017-08-27 DIAGNOSIS — F319 Bipolar disorder, unspecified: Secondary | ICD-10-CM

## 2017-08-27 DIAGNOSIS — E876 Hypokalemia: Secondary | ICD-10-CM

## 2017-08-27 DIAGNOSIS — Z9689 Presence of other specified functional implants: Secondary | ICD-10-CM

## 2017-08-27 DIAGNOSIS — I10 Essential (primary) hypertension: Secondary | ICD-10-CM | POA: Diagnosis not present

## 2017-08-27 DIAGNOSIS — E7849 Other hyperlipidemia: Secondary | ICD-10-CM | POA: Diagnosis not present

## 2017-08-27 DIAGNOSIS — R918 Other nonspecific abnormal finding of lung field: Secondary | ICD-10-CM | POA: Diagnosis not present

## 2017-08-27 DIAGNOSIS — Z48812 Encounter for surgical aftercare following surgery on the circulatory system: Secondary | ICD-10-CM | POA: Diagnosis not present

## 2017-08-27 DIAGNOSIS — Z794 Long term (current) use of insulin: Secondary | ICD-10-CM | POA: Diagnosis not present

## 2017-08-27 DIAGNOSIS — L8990 Pressure ulcer of unspecified site, unspecified stage: Secondary | ICD-10-CM

## 2017-08-27 DIAGNOSIS — Z8261 Family history of arthritis: Secondary | ICD-10-CM

## 2017-08-27 DIAGNOSIS — Z7982 Long term (current) use of aspirin: Secondary | ICD-10-CM

## 2017-08-27 DIAGNOSIS — I2510 Atherosclerotic heart disease of native coronary artery without angina pectoris: Secondary | ICD-10-CM | POA: Diagnosis not present

## 2017-08-27 DIAGNOSIS — T8189XA Other complications of procedures, not elsewhere classified, initial encounter: Secondary | ICD-10-CM | POA: Diagnosis present

## 2017-08-27 DIAGNOSIS — Z888 Allergy status to other drugs, medicaments and biological substances status: Secondary | ICD-10-CM

## 2017-08-27 DIAGNOSIS — I340 Nonrheumatic mitral (valve) insufficiency: Secondary | ICD-10-CM | POA: Diagnosis not present

## 2017-08-27 DIAGNOSIS — E1121 Type 2 diabetes mellitus with diabetic nephropathy: Secondary | ICD-10-CM

## 2017-08-27 DIAGNOSIS — Z87891 Personal history of nicotine dependence: Secondary | ICD-10-CM

## 2017-08-27 DIAGNOSIS — E1159 Type 2 diabetes mellitus with other circulatory complications: Secondary | ICD-10-CM

## 2017-08-27 DIAGNOSIS — Z9842 Cataract extraction status, left eye: Secondary | ICD-10-CM

## 2017-08-27 DIAGNOSIS — J9811 Atelectasis: Secondary | ICD-10-CM | POA: Diagnosis not present

## 2017-08-27 DIAGNOSIS — Z09 Encounter for follow-up examination after completed treatment for conditions other than malignant neoplasm: Secondary | ICD-10-CM

## 2017-08-27 DIAGNOSIS — Z885 Allergy status to narcotic agent status: Secondary | ICD-10-CM

## 2017-08-27 DIAGNOSIS — Z9841 Cataract extraction status, right eye: Secondary | ICD-10-CM

## 2017-08-27 DIAGNOSIS — K449 Diaphragmatic hernia without obstruction or gangrene: Secondary | ICD-10-CM | POA: Diagnosis present

## 2017-08-27 DIAGNOSIS — Z961 Presence of intraocular lens: Secondary | ICD-10-CM | POA: Diagnosis present

## 2017-08-27 DIAGNOSIS — R0602 Shortness of breath: Secondary | ICD-10-CM | POA: Diagnosis not present

## 2017-08-27 HISTORY — PX: LEFT HEART CATH AND CORONARY ANGIOGRAPHY: CATH118249

## 2017-08-27 LAB — GLUCOSE, CAPILLARY
GLUCOSE-CAPILLARY: 154 mg/dL — AB (ref 65–99)
GLUCOSE-CAPILLARY: 200 mg/dL — AB (ref 65–99)
Glucose-Capillary: 157 mg/dL — ABNORMAL HIGH (ref 65–99)
Glucose-Capillary: 153 mg/dL — ABNORMAL HIGH (ref 65–99)

## 2017-08-27 SURGERY — LEFT HEART CATH AND CORONARY ANGIOGRAPHY
Anesthesia: LOCAL

## 2017-08-27 MED ORDER — GABAPENTIN 300 MG PO CAPS
900.0000 mg | ORAL_CAPSULE | Freq: Two times a day (BID) | ORAL | Status: DC
  Administered 2017-08-27 – 2017-08-28 (×3): 900 mg via ORAL
  Filled 2017-08-27 (×4): qty 3

## 2017-08-27 MED ORDER — HEPARIN (PORCINE) IN NACL 100-0.45 UNIT/ML-% IJ SOLN
1700.0000 [IU]/h | INTRAMUSCULAR | Status: DC
  Administered 2017-08-27: 1150 [IU]/h via INTRAVENOUS
  Filled 2017-08-27 (×2): qty 250

## 2017-08-27 MED ORDER — ROPINIROLE HCL 1 MG PO TABS
2.0000 mg | ORAL_TABLET | Freq: Every day | ORAL | Status: DC
  Administered 2017-08-27 – 2017-09-05 (×7): 2 mg via ORAL
  Filled 2017-08-27 (×8): qty 2

## 2017-08-27 MED ORDER — FENTANYL CITRATE (PF) 100 MCG/2ML IJ SOLN
INTRAMUSCULAR | Status: DC | PRN
  Administered 2017-08-27: 25 ug via INTRAVENOUS

## 2017-08-27 MED ORDER — FENTANYL CITRATE (PF) 100 MCG/2ML IJ SOLN
INTRAMUSCULAR | Status: AC
  Filled 2017-08-27: qty 2

## 2017-08-27 MED ORDER — LOSARTAN POTASSIUM 50 MG PO TABS
50.0000 mg | ORAL_TABLET | Freq: Every day | ORAL | Status: DC
  Administered 2017-08-27 – 2017-08-28 (×2): 50 mg via ORAL
  Filled 2017-08-27 (×2): qty 1

## 2017-08-27 MED ORDER — SODIUM CHLORIDE 0.9 % WEIGHT BASED INFUSION
3.0000 mL/kg/h | INTRAVENOUS | Status: DC
  Administered 2017-08-27: 3 mL/kg/h via INTRAVENOUS

## 2017-08-27 MED ORDER — ACETAMINOPHEN 325 MG PO TABS
650.0000 mg | ORAL_TABLET | ORAL | Status: DC | PRN
  Administered 2017-08-28: 650 mg via ORAL
  Filled 2017-08-27: qty 2

## 2017-08-27 MED ORDER — SODIUM CHLORIDE 0.9% FLUSH: 3.0000 mL | INTRAVENOUS | Status: DC | PRN

## 2017-08-27 MED ORDER — MECLIZINE HCL 25 MG PO TABS: 25.0000 mg | ORAL_TABLET | Freq: Every day | ORAL | Status: DC | PRN

## 2017-08-27 MED ORDER — ROPINIROLE HCL 1 MG PO TABS
4.0000 mg | ORAL_TABLET | Freq: Every day | ORAL | Status: DC
  Administered 2017-08-27 – 2017-09-04 (×8): 4 mg via ORAL
  Filled 2017-08-27 (×11): qty 4

## 2017-08-27 MED ORDER — ALBUTEROL SULFATE (2.5 MG/3ML) 0.083% IN NEBU
2.5000 mg | INHALATION_SOLUTION | Freq: Four times a day (QID) | RESPIRATORY_TRACT | Status: DC | PRN
  Administered 2017-08-28: 2.5 mg via RESPIRATORY_TRACT

## 2017-08-27 MED ORDER — HEPARIN (PORCINE) IN NACL 1000-0.9 UT/500ML-% IV SOLN
INTRAVENOUS | Status: AC
  Filled 2017-08-27: qty 1000

## 2017-08-27 MED ORDER — LAMOTRIGINE 100 MG PO TABS
150.0000 mg | ORAL_TABLET | Freq: Every day | ORAL | Status: DC
  Administered 2017-08-27 – 2017-08-28 (×2): 150 mg via ORAL
  Filled 2017-08-27 (×2): qty 2

## 2017-08-27 MED ORDER — HEPARIN SODIUM (PORCINE) 1000 UNIT/ML IJ SOLN
INTRAMUSCULAR | Status: AC
  Filled 2017-08-27: qty 1

## 2017-08-27 MED ORDER — SODIUM CHLORIDE 0.9 % WEIGHT BASED INFUSION: 1.0000 mL/kg/h | INTRAVENOUS | Status: DC

## 2017-08-27 MED ORDER — LIDOCAINE HCL (PF) 1 % IJ SOLN
INTRAMUSCULAR | Status: DC | PRN
  Administered 2017-08-27: 2 mL via INTRADERMAL

## 2017-08-27 MED ORDER — KETOCONAZOLE 2 % EX CREA: 1.0000 "application " | TOPICAL_CREAM | Freq: Two times a day (BID) | CUTANEOUS | Status: DC | PRN

## 2017-08-27 MED ORDER — INSULIN GLARGINE 100 UNIT/ML ~~LOC~~ SOLN
30.0000 [IU] | Freq: Every day | SUBCUTANEOUS | Status: DC
  Administered 2017-08-27 – 2017-08-28 (×2): 30 [IU] via SUBCUTANEOUS
  Filled 2017-08-27 (×2): qty 0.3

## 2017-08-27 MED ORDER — VENLAFAXINE HCL ER 150 MG PO CP24
300.0000 mg | ORAL_CAPSULE | Freq: Every day | ORAL | Status: DC
  Administered 2017-08-27 – 2017-08-28 (×2): 300 mg via ORAL
  Filled 2017-08-27 (×2): qty 2

## 2017-08-27 MED ORDER — BACLOFEN 10 MG PO TABS
10.0000 mg | ORAL_TABLET | Freq: Every day | ORAL | Status: DC
  Administered 2017-08-27 – 2017-08-28 (×2): 10 mg via ORAL
  Filled 2017-08-27 (×2): qty 1

## 2017-08-27 MED ORDER — ONDANSETRON HCL 4 MG/2ML IJ SOLN: 4.0000 mg | Freq: Four times a day (QID) | INTRAMUSCULAR | Status: DC | PRN

## 2017-08-27 MED ORDER — TRAMADOL HCL 50 MG PO TABS
100.0000 mg | ORAL_TABLET | Freq: Every evening | ORAL | Status: DC | PRN
  Administered 2017-08-28 (×2): 100 mg via ORAL
  Filled 2017-08-27 (×2): qty 2

## 2017-08-27 MED ORDER — SODIUM CHLORIDE 0.9% FLUSH
3.0000 mL | Freq: Two times a day (BID) | INTRAVENOUS | Status: DC
  Administered 2017-08-27 – 2017-08-28 (×4): 3 mL via INTRAVENOUS

## 2017-08-27 MED ORDER — ROPINIROLE HCL 1 MG PO TABS: 2.0000 mg | ORAL_TABLET | ORAL | Status: DC

## 2017-08-27 MED ORDER — LEVOTHYROXINE SODIUM 100 MCG PO TABS
100.0000 ug | ORAL_TABLET | Freq: Every day | ORAL | Status: DC
  Administered 2017-08-28 – 2017-08-29 (×2): 100 ug via ORAL
  Filled 2017-08-27 (×2): qty 1

## 2017-08-27 MED ORDER — INSULIN ASPART 100 UNIT/ML ~~LOC~~ SOLN
0.0000 [IU] | Freq: Three times a day (TID) | SUBCUTANEOUS | Status: DC
  Administered 2017-08-27: 3 [IU] via SUBCUTANEOUS
  Administered 2017-08-28 (×3): 2 [IU] via SUBCUTANEOUS

## 2017-08-27 MED ORDER — BUPROPION HCL ER (XL) 150 MG PO TB24
300.0000 mg | ORAL_TABLET | Freq: Every morning | ORAL | Status: DC
  Administered 2017-08-28: 300 mg via ORAL
  Filled 2017-08-27: qty 2

## 2017-08-27 MED ORDER — SODIUM CHLORIDE 0.9 % IV SOLN: 250.0000 mL | INTRAVENOUS | Status: DC | PRN

## 2017-08-27 MED ORDER — PANTOPRAZOLE SODIUM 40 MG PO TBEC
40.0000 mg | DELAYED_RELEASE_TABLET | Freq: Every day | ORAL | Status: DC
  Administered 2017-08-28: 40 mg via ORAL
  Filled 2017-08-27: qty 1

## 2017-08-27 MED ORDER — HEPARIN (PORCINE) IN NACL 2-0.9 UNITS/ML
INTRAMUSCULAR | Status: AC | PRN
  Administered 2017-08-27 (×2): 500 mL via INTRA_ARTERIAL

## 2017-08-27 MED ORDER — ASPIRIN EC 81 MG PO TBEC
81.0000 mg | DELAYED_RELEASE_TABLET | Freq: Every day | ORAL | Status: DC
  Administered 2017-08-28: 81 mg via ORAL
  Filled 2017-08-27 (×2): qty 1

## 2017-08-27 MED ORDER — SODIUM CHLORIDE 0.9 % IV SOLN: INTRAVENOUS | Status: AC

## 2017-08-27 MED ORDER — LIDOCAINE HCL (PF) 1 % IJ SOLN
INTRAMUSCULAR | Status: AC
  Filled 2017-08-27: qty 30

## 2017-08-27 MED ORDER — MIDAZOLAM HCL 2 MG/2ML IJ SOLN
INTRAMUSCULAR | Status: AC
  Filled 2017-08-27: qty 2

## 2017-08-27 MED ORDER — TRAZODONE HCL 50 MG PO TABS
50.0000 mg | ORAL_TABLET | Freq: Every day | ORAL | Status: DC
  Administered 2017-08-27 – 2017-08-28 (×2): 50 mg via ORAL
  Filled 2017-08-27 (×2): qty 1

## 2017-08-27 MED ORDER — VERAPAMIL HCL 2.5 MG/ML IV SOLN
INTRAVENOUS | Status: DC | PRN
  Administered 2017-08-27: 10 mL via INTRA_ARTERIAL

## 2017-08-27 MED ORDER — MIDAZOLAM HCL 2 MG/2ML IJ SOLN
INTRAMUSCULAR | Status: DC | PRN
  Administered 2017-08-27: 1 mg via INTRAVENOUS

## 2017-08-27 MED ORDER — IOHEXOL 350 MG/ML SOLN
INTRAVENOUS | Status: DC | PRN
  Administered 2017-08-27: 90 mL via INTRA_ARTERIAL

## 2017-08-27 MED ORDER — ASPIRIN 81 MG PO CHEW: 81.0000 mg | CHEWABLE_TABLET | ORAL | Status: DC

## 2017-08-27 MED ORDER — VERAPAMIL HCL 2.5 MG/ML IV SOLN
INTRAVENOUS | Status: AC
  Filled 2017-08-27: qty 2

## 2017-08-27 MED ORDER — HEPARIN SODIUM (PORCINE) 1000 UNIT/ML IJ SOLN
INTRAMUSCULAR | Status: DC | PRN
  Administered 2017-08-27: 6000 [IU] via INTRAVENOUS

## 2017-08-27 MED ORDER — PROMETHAZINE HCL 25 MG PO TABS: 25.0000 mg | ORAL_TABLET | Freq: Every evening | ORAL | Status: DC | PRN

## 2017-08-27 SURGICAL SUPPLY — 13 items
CATH INFINITI 5 FR JL3.5 (CATHETERS) ×1 IMPLANT
CATH INFINITI 5FR ANG PIGTAIL (CATHETERS) ×1 IMPLANT
CATH INFINITI JR4 5F (CATHETERS) ×1 IMPLANT
DEVICE RAD COMP TR BAND LRG (VASCULAR PRODUCTS) ×1 IMPLANT
GLIDESHEATH SLEND SS 6F .021 (SHEATH) ×1 IMPLANT
GUIDEWIRE INQWIRE 1.5J.035X260 (WIRE) IMPLANT
INQWIRE 1.5J .035X260CM (WIRE) ×2
KIT HEART LEFT (KITS) ×2 IMPLANT
PACK CARDIAC CATHETERIZATION (CUSTOM PROCEDURE TRAY) ×2 IMPLANT
SYR MEDRAD MARK V 150ML (SYRINGE) ×2 IMPLANT
TRANSDUCER W/STOPCOCK (MISCELLANEOUS) ×2 IMPLANT
TUBING CIL FLEX 10 FLL-RA (TUBING) ×2 IMPLANT
WIRE HI TORQ VERSACORE-J 145CM (WIRE) ×1 IMPLANT

## 2017-08-27 NOTE — Consult Note (Addendum)
[address]       [place] [address]             [phone]        Norma [name] [facility] Medical Record #[id number] Date of Birth: 1950/06/29  Referring: No ref. provider found Primary Care: Sinda Du, MD Primary Cardiologist:Branch, Roderic Palau, MD  Chief Complaint:   No chief complaint on file.   History of Present Illness:      The patient is a 67 year old obese female with a past medical history significant for diabetes mellitus type 2, chronic low back pain, hypertension, obstructive sleep apnea, GERD, COPD hyperlipidemia, hypothyroidism, left bundle branch block, bipolar I disorder, and a recently diagnosed left parietal CVA in January 2019 who presents to her cardiologist office for an evaluation for chest pain.  She was seen by Dr. Harl Bowie back in the fall of 2017 and  Had a Myoview performed which showed that she was low risk. She now is having chest pain with activity with associated shortness of breath.  She was scheduled for a cardiac catheterization today which revealed severe multivessel disease with critical stenosis of the LAD/first diagonal bifurcation, large OM branch of circumflex, and PDA branch of RCA.  Her last echocardiogram was on June 06, 2017 which showed an estimated LVEF of 40 to 45%.  She was noted to have hypokinesis of the anteroseptal, inferoseptal and basal to mid inferior myocardium.  There was no valvular disease noted at this time.  She was placed on IV heparin.  We are being consulted for consideration for coronary artery bypass grafting.  The patient is currently chest pain-free.   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  She recently has had difficulty getting to work due to her multiple comorbidities.  Specifically, her issues with GI dysfunction.  She was convinced that her chest discomfort was due to  a lactose intolerance therefore she started taking lactose. She loves ice cream and milk before bed and refused to give it up.  Her pain did resolve after a few months however, now she is becoming more short of breath and has had the addition of some peripheral edema.    Current Activity/ Functional Status: Patient was independent with mobility/ambulation, transfers, ADL's, IADL's.   Zubrod Score: At the time of surgery this patient's most appropriate activity status/level should be described as: []     0    Normal activity, no symptoms [x]     1    Restricted in physical strenuous activity but ambulatory, able to do out light work []     2    Ambulatory and capable of self care, unable to do work activities, up and about                 more than 50%  Of the time                            []     3    Only limited self care, in bed greater than 50% of waking hours []     4    Completely disabled, no self care, confined to bed or chair []     5    Moribund  Past Medical History:  Diagnosis Date  . Anemia   . Anxiety   .  Bipolar disorder ([place])   . Bulging lumbar disc    L3-4  . Chronic daily headache   . Chronic low back pain 09/20/2014  . COPD (chronic obstructive pulmonary disease) ([place])   . Degenerative arthritis   . Depression   . Diabetes mellitus   . Diabetes mellitus, type II ([place])   . DM type 2 with diabetic peripheral neuropathy ([place]) 09/20/2014  . Dyslipidemia   . Dyspnea   . Gastroparesis   . GERD (gastroesophageal reflux disease)   . Heart murmur   . History of hiatal hernia   . Hypothyroidism   . IBS (irritable bowel syndrome)   . Memory difficulty 09/20/2014  . Morbid obesity ([place])   . Neuropathy   . Obstructive sleep apnea on CPAP   . Restless legs syndrome (RLS) 09/17/2012  . Stroke (cerebrum) ([place]) 05/16/2017   Left parietal    Past Surgical History:  Procedure Laterality Date  . ABDOMINAL HYSTERECTOMY    . ARTHROSCOPY KNEE W/ DRILLING  06/2011   and Decemer of  2013.  [name] Carpel tunnel  1980's  . CATARACT EXTRACTION W/PHACO Right 03/21/2017   Procedure: CATARACT EXTRACTION PHACO AND INTRAOCULAR LENS PLACEMENT RIGHT EYE;  Surgeon: Baruch Goldmann, MD;  Location: [facility];  Service: Ophthalmology;  Laterality: Right;  CDE: 2.91   . CATARACT EXTRACTION W/PHACO Left 04/18/2017   Procedure: CATARACT EXTRACTION PHACO AND INTRAOCULAR LENS PLACEMENT LEFT EYE;  Surgeon: Baruch Goldmann, MD;  Location: [facility];  Service: Ophthalmology;  Laterality: Left;  left  . CHOLECYSTECTOMY    . COLONOSCOPY WITH PROPOFOL N/A 10/04/2016   Procedure: COLONOSCOPY WITH PROPOFOL;  Surgeon: Rogene Houston, MD;  Location: [facility];  Service: Endoscopy;  Laterality: N/A;  11:10  . ESOPHAGOGASTRODUODENOSCOPY N/A 04/25/2016   Procedure: ESOPHAGOGASTRODUODENOSCOPY (EGD);  Surgeon: Rogene Houston, MD;  Location: [facility];  Service: Endoscopy;  Laterality: N/A;  730  . RECTAL SURGERY     fissure  . SHOULDER SURGERY Left    arthroscopy in March of this year    Social History   Tobacco Use  Smoking Status Former Smoker  . Types: Cigarettes  . Last attempt to quit: 02/04/1971  . Years since quitting: 46.5  Smokeless Tobacco Never Used  Tobacco Comment   smoked 2 cigarettes a day    Social History   Substance and Sexual Activity  Alcohol Use No  . Alcohol/week: 0.0 oz     Allergies  Allergen Reactions  . Amoxicillin Anaphylaxis and Other (See Comments)    Has patient had a PCN reaction causing immediate rash, facial/tongue/throat swelling, SOB or lightheadedness with hypotension: Yes Has patient had a PCN reaction causing severe rash involving mucus membranes or skin necrosis: Yes Has patient had a PCN reaction that required hospitalization: Yes Has patient had a PCN reaction occurring within the last 10 years: Yes If all of the above answers are "NO", then may proceed with Cephalosporin use.   [name] Hydrocodone Anaphylaxis  . Depacon [Valproic Acid] Other (See  Comments)    Causes falls     Current Facility-Administered Medications  Medication Dose Route Frequency Provider Last Rate Last Dose  . 0.9 %  sodium chloride infusion   Intravenous Continuous Sherren Mocha, MD 75 mL/hr at 08/27/17 1142    . 0.9% sodium chloride infusion  1 mL/kg/hr Intravenous Continuous Erlene Quan, PA-C 116.1 mL/hr at 08/27/17 0933 1 mL/kg/hr at 08/27/17 0933  . [START ON 08/28/2017] aspirin chewable tablet 81 mg  81  mg Oral Pre-Cath Kilroy, Luke K, [place]      . sodium chloride flush (NS) 0.9 % injection 3 mL  3 mL Intravenous PRN Kerin Ransom K, PA-C        Medications Prior to Admission  Medication Sig Dispense Refill Last Dose  . aspirin EC 81 MG tablet Take 81 mg by mouth daily.   08/27/2017 at 0630  . aspirin-acetaminophen-caffeine (EXCEDRIN MIGRAINE) 250-250-65 MG tablet Take 2 tablets by mouth every 6 (six) hours as needed for headache.   Past Week at Unknown time  . baclofen (LIORESAL) 10 MG tablet TAKE ONE TABLET BY MOUTH AT BEDTIME. 30 tablet 1 08/26/2017 at Unknown time  . buPROPion (WELLBUTRIN XL) 300 MG 24 hr tablet TAKE ONE TABLET BY MOUTH EVERY MORNING. 30 tablet 2 08/27/2017 at 0630  . Charcoal Activated (CHARCOAL PO) Take 2 capsules by mouth daily as needed (for gas).    Past Month at Unknown time  . diclofenac sodium (VOLTAREN) 1 % GEL Apply 1 application topically daily as needed (for pain).    Past Week at Unknown time  . esomeprazole (NEXIUM) 40 MG capsule Take 40 mg by mouth daily.   12 08/27/2017 at 0630  . furosemide (LASIX) 40 MG tablet Take 40 mg by mouth daily as needed for fluid.   12 Past Week at Unknown time  . gabapentin (NEURONTIN) 300 MG capsule Take 900 mg by mouth 2 (two) times daily.    08/26/2017 at Unknown time  . Insulin Glargine (TOUJEO SOLOSTAR) 300 UNIT/ML SOPN Inject 30 Units into the skin at bedtime. 13.5 mL 2 08/26/2017 at Unknown time  . ketoconazole (NIZORAL) 2 % cream Apply 1 application topically 2 (two) times daily as needed  for irritation.   3 Past Week at Unknown time  . lamoTRIgine (LAMICTAL) 150 MG tablet TAKE ONE TABLET BY MOUTH AT BEDTIME. 30 tablet 2 08/26/2017 at Unknown time  . levothyroxine (SYNTHROID, LEVOTHROID) 100 MCG tablet Take 100 mcg by mouth daily before breakfast.    08/27/2017 at 0630  . losartan (COZAAR) 50 MG tablet Take 50 mg by mouth at bedtime.    08/26/2017 at Unknown time  . meclizine (ANTIVERT) 25 MG tablet Take 1 tablet (25 mg total) by mouth 3 (three) times daily as needed for dizziness. (Patient taking differently: Take 25 mg by mouth daily as needed for dizziness. ) 30 tablet 0 Past Week at Unknown time  . metFORMIN (GLUCOPHAGE) 500 MG tablet Take 2 tablets (1,000 mg total) by mouth 2 (two) times daily with a meal. 60 tablet 3 08/24/2017  . oxymetazoline (AFRIN) 0.05 % nasal spray Place 1 spray into both nostrils as needed for congestion.   08/27/2017 at 0630  . promethazine (PHENERGAN) 25 MG tablet Take 25 mg by mouth at bedtime as needed for nausea or vomiting.    Past Month at Unknown time  . rOPINIRole (REQUIP) 2 MG tablet Take 2-4 mg by mouth See admin instructions. Takes 2 mg by mouth in the morning and 4 mg by mouth in the evening   08/27/2017 at 0630  . traMADol (ULTRAM) 50 MG tablet Take 2 tablets (100 mg total) by mouth every 8 (eight) hours as needed for pain. (Patient taking differently: Take 100 mg by mouth at bedtime as needed for moderate pain. ) 180 tablet 5 Past Month at Unknown time  . traZODone (DESYREL) 50 MG tablet Take 50 mg by mouth at bedtime.  5 Past Week at Unknown time  . venlafaxine  XR (EFFEXOR-XR) 150 MG 24 hr capsule TAKE TWO (2) CAPSULES BY MOUTH AT BEDTIME. (Patient taking differently: TAKE 300 MG BY MOUTH AT BEDTIME) 60 capsule 2 08/26/2017 at Unknown time  . albuterol (PROVENTIL HFA;VENTOLIN HFA) 108 (90 Base) MCG/ACT inhaler Inhale 2 puffs into the lungs every 6 (six) hours as needed for wheezing or shortness of breath.   More than a month at Unknown time  .  EPINEPHrine (EPIPEN 2-PAK) 0.3 mg/0.3 mL IJ SOAJ injection Inject 0.3 mg into the muscle once.   Unknown at Unknown time  . Insulin Pen Needle (LITE TOUCH PEN NEEDLES) 31G X 5 MM MISC USE ONE DAILY. (Patient not taking: Reported on 08/26/2017) 100 each 5 Not Taking at Unknown time  . Polyvinyl Alcohol-Povidone (REFRESH OP) Place 2 drops into both eyes daily as needed (for dry eyes).   More than a month at Unknown time    Family History  Problem Relation Age of Onset  . Hypertension Mother   . Lymphoma Mother   . Depression Mother   . Arthritis Father   . Alcohol abuse Father   . Hypertension Sister   . Cancer Brother        kidney and lung  . Alcohol abuse Brother   . Alcohol abuse Paternal Uncle   . Alcohol abuse Paternal Grandfather   . Alcohol abuse Paternal Grandmother      Review of Systems:   ROS Pertinent items are noted in HPI.     Cardiac Review of Systems: Y or  [    ]= no  Chest Pain [    ]  Resting SOB [   ] Exertional SOB  [ Y ]  Orthopnea [  ]   Pedal Edema [ Y  ]    Palpitations [ N ] Syncope  [  ]   Presyncope [   ]  General Review of Systems: [Y] = yes [  ]=no Constitional: recent weight change [  ]; anorexia [  ]; fatigue [ Y ]; nausea [  ]; night sweats [  ]; fever [ N ]; or chills [  ]                                                               Dental: Last Dentist visit:   Eye : blurred vision [  ]; diplopia [   ]; vision changes [  ];  Amaurosis fugax[  ]; Resp: cough [ N ];  wheezing[ N ];  hemoptysis[  ]; shortness of breath[ Y ]; paroxysmal nocturnal dyspnea[  ]; dyspnea on exertion[ Y ]; or orthopnea[  ];  GI:  gallstones[  ], vomiting[ N ];  dysphagia[  ]; melena[  ];  hematochezia [  ]; heartburn[ Y ];   Hx of  Colonoscopy[  ]; GU: kidney stones [  ]; hematuria[  ];   dysuria [  ];  nocturia[  ];  history of     obstruction [  ]; urinary frequency [  ]             Skin: rash, swelling[  ];, hair loss[  ];  peripheral edema[ Y ];  or itching[   ]; Musculosketetal: myalgias[  ];  joint swelling[  ];  joint erythema[  ];  joint pain[ Y ];  back pain[ Y ];  Heme/Lymph: bruising[  ];  bleeding[ N ];  anemia[ Y ];  Neuro: TIA[  ];  headaches[  ];  stroke[ Y ];  vertigo[  ];  seizures[  ];   paresthesias[  ];  difficulty walking[Y  ];  Psych:depression[Y  ]; anxiety[  ];  Endocrine: diabetes[ Y ];  thyroid dysfunction[ Y ];               Physical Exam: BP (!) 103/41   Pulse 78   Temp 98 F (36.7 C) (Oral)   Resp 19   Ht 5\' 4"  (1.626 m)   Wt 266 lb (120.7 kg)   SpO2 98%   BMI 45.66 kg/m    General appearance: alert, cooperative and no distress Resp: clear to auscultation bilaterally Cardio: regular rate and rhythm, S1, S2 normal, no murmur, click, rub or gallop Extremities: 1-2+ nonpitting edema in bilateral extremities  Neurologic: Grossly normal  Diagnostic Studies & Laboratory data:     Recent Radiology Findings:   CLINICAL DATA:  Preop for heart catheterization  EXAM: CHEST - 2 VIEW  COMPARISON:  02/13/2016  FINDINGS: Normal heart size. Stable mild aortic tortuosity. There is no edema, consolidation, effusion, or pneumothorax. No osseous findings.  IMPRESSION: No evidence of active disease.   Electronically Signed   By: Monte Fantasia M.D.   On: 08/23/2017 08:52    I have independently reviewed the above radiologic studies and discussed with the patient   Recent Lab Findings: Lab Results  Component Value Date   WBC 11.9 (H) 08/22/2017   HGB 11.9 (L) 08/22/2017   HCT 37.4 08/22/2017   PLT 413 (H) 08/22/2017   GLUCOSE 233 (H) 08/22/2017   CHOL 161 07/10/2016   TRIG 153 (H) 07/10/2016   HDL 47 (L) 07/10/2016   LDLCALC 83 07/10/2016   ALT 8 07/11/2017   AST 9 (L) 07/11/2017   NA 138 08/22/2017   K 4.2 08/22/2017   CL 101 08/22/2017   CREATININE 0.84 08/22/2017   BUN 15 08/22/2017   CO2 29 08/22/2017   TSH 3.71 07/11/2017   INR 0.90 08/22/2017   HGBA1C 7.5 (H) 07/11/2017       Assessment / Plan:      1.  Multivessel CAD-continue asa, heparin, and admit to [facility] for workup for high risk CABG.  2.  Gastroparesis/GERD- ++flatus and motility issues. Continue PPI.  EGD and colonoscopy back in Jan 2018 due to iron deficiency anemia. No active bleed. Diagnosed with small hiatal hernia and gastritis without H. Pylori.  3. Cardiomyopathy- EF 40-45% on Jan 2019 echocardiogram. Weight gain over the last few months with increased peripheral edema 4. LBBB- chronic, aymptomatic 5. Left parietal stoke in January 2019, does not appear to have residual effects.  6. Diabetes mellitus type 2- On metformin and Troujeo  7. Hypothyroidism-continue Levothyroxine, recent TSH and free T4 within range 8. Hypertension-BP low at the moment. Will restart Cozaar when appropriate.  9. Morbid Obesity- BMI of 45.66 10. COPD- per patient she is solely on a rescue inhaler and no other medication. Not on home oxygen.   Plan: Discussed the procedure in detail with the patient and family at bedside. All questions were answered to their satisfaction. Dr. Prescott Gum to evaluate patient for candidacy.     I  spent 40 minutes counseling the patient face to face.   Nicholes Rough, PA-C 08/27/2017 12:08 PM  Patient examined and cath -echo  images personally reviewed I have discussed CABG with the patient  which would be at some increased risk due to obesity and degenerative arthritis but would be the best long term therapy for her cAD. She agrees CABG sched for am

## 2017-08-27 NOTE — Interval H&P Note (Signed)
History and Physical Interval Note:  08/27/2017 10:44 AM  Norma [name]  has presented today for surgery, with the diagnosis of chest pain  The various methods of treatment have been discussed with the patient and family. After consideration of risks, benefits and other options for treatment, the patient has consented to  Procedure(s): LEFT HEART CATH AND CORONARY ANGIOGRAPHY (N/A) as a surgical intervention .  The patient's history has been reviewed, patient examined, no change in status, stable for surgery.  I have reviewed the patient's chart and labs.  Questions were answered to the patient's satisfaction.     Sherren Mocha

## 2017-08-27 NOTE — H&P (Signed)
08/22/2017 Norma [name]   [birth year]  [id number]  Primary Physician Sinda Du, MD Primary Cardiologist: Dr Harl Bowie  HPI:  67 y/o obese female with a history of prio chest pain evaluation with a negative Myoview in Dec 2017. Her LVF was normal then-55-60% by Myoview. Other ,medical problems include DM, HTN, HLD, LBBB, Bipolar disorder, and recent diagnosis of Lt parietal CVA by MRI Jan 2019. Echo done Feb 2019 showed LVD-EF 40-45% with AS and inferior WMA. The pt does have chest pain, hard for her to describe but she feels its is related to activity.         Current Outpatient Medications  Medication Sig Dispense Refill  . albuterol (PROVENTIL HFA;VENTOLIN HFA) 108 (90 Base) MCG/ACT inhaler Inhale 2 puffs into the lungs every 6 (six) hours as needed for wheezing or shortness of breath.    . ASPIRIN 81 PO Take 1 tablet by mouth daily.    . Aspirin-Acetaminophen-Caffeine (EXCEDRIN MIGRAINE PO) Take 2 tablets by mouth daily as needed (pain). Take with tramadol    . baclofen (LIORESAL) 10 MG tablet TAKE ONE TABLET BY MOUTH AT BEDTIME. 30 tablet 1  . buPROPion (WELLBUTRIN XL) 300 MG 24 hr tablet TAKE ONE TABLET BY MOUTH EVERY MORNING. 30 tablet 2  . Charcoal Activated (CHARCOAL PO) Take 2 capsules by mouth daily as needed (FOR CHEST PAIN).    [name] diclofenac sodium (VOLTAREN) 1 % GEL Apply 1 application topically as needed (pain).     [name] EPINEPHrine (EPIPEN IJ) Inject 1 Dose as directed as needed (allergic reaction).     [name] esomeprazole (NEXIUM) 40 MG capsule Take 40 mg by mouth 2 (two) times daily.  12  . furosemide (LASIX) 40 MG tablet Take 40 mg by mouth daily as needed.  12  . gabapentin (NEURONTIN) 300 MG capsule Take 900 mg by mouth 2 (two) times daily.     . Insulin Glargine (TOUJEO SOLOSTAR) 300 UNIT/ML SOPN Inject 30 Units into the skin at bedtime.    . Insulin Glargine (TOUJEO SOLOSTAR) 300 UNIT/ML SOPN Inject 30 Units into the skin at bedtime. 13.5 mL 2  .  Insulin Pen Needle (LITE TOUCH PEN NEEDLES) 31G X 5 MM MISC USE ONE DAILY. 100 each 5  . lamoTRIgine (LAMICTAL) 150 MG tablet TAKE ONE TABLET BY MOUTH AT BEDTIME. 30 tablet 2  . levothyroxine (SYNTHROID, LEVOTHROID) 100 MCG tablet Take 100 mcg by mouth daily before breakfast.     . loratadine-pseudoephedrine (CLARITIN-D 24-HOUR) 10-240 MG 24 hr tablet Take 1 tablet by mouth at bedtime.    [name] losartan (COZAAR) 50 MG tablet Take 50 mg by mouth at bedtime.     . meclizine (ANTIVERT) 25 MG tablet Take 1 tablet (25 mg total) by mouth 3 (three) times daily as needed for dizziness. (Patient taking differently: Take 25 mg by mouth daily as needed for dizziness. ) 30 tablet 0  . metFORMIN (GLUCOPHAGE) 500 MG tablet Take 2 tablets (1,000 mg total) by mouth 2 (two) times daily with a meal. 60 tablet 3  . oxymetazoline (AFRIN) 0.05 % nasal spray Place 1 spray into both nostrils as needed for congestion.    . promethazine (PHENERGAN) 25 MG tablet Take 25 mg by mouth at bedtime as needed for nausea or vomiting.     [name] rOPINIRole (REQUIP) 2 MG tablet Take 2-4 mg by mouth 2 (two) times daily. Takes 1 tablet in the morning and 2 tablets in the evening    . traMADol Veatrice Bourbon)  50 MG tablet Take 2 tablets (100 mg total) by mouth every 8 (eight) hours as needed for pain. (Patient taking differently: Take 100 mg by mouth every 6 (six) hours as needed for moderate pain (takes at bedtime every night). ) 180 tablet 5  . traZODone (DESYREL) 50 MG tablet Take 50 mg by mouth at bedtime.  5  . venlafaxine XR (EFFEXOR-XR) 150 MG 24 hr capsule TAKE TWO (2) CAPSULES BY MOUTH AT BEDTIME. (Patient taking differently: TAKE 1 CAPSULE BY MOUTH TWICE DAILY) 60 capsule 2   No current facility-administered medications for this visit.          Allergies  Allergen Reactions  . Amoxicillin Anaphylaxis and Other (See Comments)    Has patient had a PCN reaction causing immediate rash, facial/tongue/throat swelling, SOB or  lightheadedness with hypotension: Yes Has patient had a PCN reaction causing severe rash involving mucus membranes or skin necrosis: Yes Has patient had a PCN reaction that required hospitalization: Yes Has patient had a PCN reaction occurring within the last 10 years: Yes If all of the above answers are "NO", then may proceed with Cephalosporin use.   [name] Hydrocodone Anaphylaxis  . Hydrocodone-Acetaminophen Other (See Comments)  . Depacon [Valproic Acid] Other (See Comments)    Causes falls         Past Medical History:  Diagnosis Date  . Anemia   . Anxiety   . Bipolar disorder ([place])   . Bulging lumbar disc    L3-4  . Chronic daily headache   . Chronic low back pain 09/20/2014  . COPD (chronic obstructive pulmonary disease) ([place])   . Degenerative arthritis   . Depression   . Diabetes mellitus   . Diabetes mellitus, type II ([place])   . DM type 2 with diabetic peripheral neuropathy ([place]) 09/20/2014  . Dyslipidemia   . Dyspnea   . Gastroparesis   . GERD (gastroesophageal reflux disease)   . Heart murmur   . History of hiatal hernia   . Hypothyroidism   . IBS (irritable bowel syndrome)   . Memory difficulty 09/20/2014  . Morbid obesity ([place])   . Neuropathy   . Obstructive sleep apnea on CPAP   . Restless legs syndrome (RLS) 09/17/2012  . Stroke (cerebrum) ([place]) 05/16/2017   Left parietal    Social History        Socioeconomic History  . Marital status: Single    Spouse name: Not on file  . Number of children: 0  . Years of education: 56  . Highest education level: Not on file  Occupational History    Employer: [employer]  . Financial resource strain: Not on file  . Food insecurity:    Worry: Not on file    Inability: Not on file  . Transportation needs:    Medical: Not on file    Non-medical: Not on file  Tobacco Use  . Smoking status: Former Smoker    Types: Cigarettes    Last attempt to quit:  02/04/1971    Years since quitting: 46.5  . Smokeless tobacco: Never Used  . Tobacco comment: smoked 2 cigarettes a day  Substance and Sexual Activity  . Alcohol use: No    Alcohol/week: 0.0 oz  . Drug use: No  . Sexual activity: Never  Lifestyle  . Physical activity:    Days per week: Not on file    Minutes per session: Not on file  . Stress: Not on file  Relationships  . Social connections:    Talks on phone: Not on file    Gets together: Not on file    Attends religious service: Not on file    Active member of club or organization: Not on file    Attends meetings of clubs or organizations: Not on file    Relationship status: Not on file  . Intimate partner violence:    Fear of current or ex partner: Not on file    Emotionally abused: Not on file    Physically abused: Not on file    Forced sexual activity: Not on file  Other Topics Concern  . Not on file  Social History Narrative   Patient lives at home alone.    Patient has no children.    Patient has her masters in nursing.    Patient is single.    Patient drinks about 2 glasses of tea daily.   Patient is right handed.          Family History  Problem Relation Age of Onset  . Hypertension Mother   . Lymphoma Mother   . Depression Mother   . Arthritis Father   . Alcohol abuse Father   . Hypertension Sister   . Cancer Brother        kidney and lung  . Alcohol abuse Brother   . Alcohol abuse Paternal Uncle   . Alcohol abuse Paternal Grandfather   . Alcohol abuse Paternal Grandmother      Review of Systems: General: negative for chills, fever, night sweats or weight changes.  Cardiovascular: negative for chest pain, dyspnea on exertion, edema, orthopnea, palpitations, paroxysmal nocturnal dyspnea or shortness of breath Dermatological: negative for rash Respiratory: negative for cough or wheezing Urologic: negative for hematuria Abdominal: negative for  nausea, vomiting, diarrhea, bright red blood per rectum, melena, or hematemesis Neurologic: negative for visual changes, syncope, or dizziness All other systems reviewed and are otherwise negative except as noted above.    Blood pressure 128/74, pulse (!) 107, height 5\' 3"  (1.6 m), weight 256 lb (116.1 kg), SpO2 98 %.  General appearance: alert, cooperative, no distress and morbidly obese Neck: no carotid bruit and no JVD Lungs: clear to auscultation bilaterally Heart: regular rate and rhythm and short 2/6 systolic murmur AOV and LSB Abdomen: obese, non tender Extremities: 1+ pedal edema Pulses: Rt radial pulse 2+ Skin: Skin color, texture, turgor normal. No rashes or lesions Neurologic: Grossly normal  EKG NSR, LBBB  ASSESSMENT AND PLAN:   Chest pain with moderate risk for cardiac etiology Low risk Myoview in 2017 but now with abnormal LVF by echo and history of exertional chest pain.  Cardiomyopathy ([place]) EF 40-45% with AS, inferior HK by echo Jan 2019  LBBB (left bundle branch block) Chronic  Stroke (cerebrum) ([place]) Left parietal noted on MRI Jan 2019  Insulin dependent diabetes mellitus ([place]) .  Morbid obesity, unspecified obesity type ([place]) BMI 45   PLAN  Discussed with Dr Harl Bowie- plan OP diagnostic cath next week. The patient understands that risks included but are not limited to stroke (1 in 1000), death (1 in 11), kidney failure [usually temporary] (1 in 500), bleeding (1 in 200), allergic reaction [possibly serious] (1 in 200).  The patient understands and agrees to proceed.   Kerin Ransom PA-C 08/22/2017 1:36 PM   ADDENDUM: This is an addendum to the office note copied above.  The patient presented today for outpatient cardiac catheterization for evaluation of chest pain, left bundle  branch block, and abnormal echocardiogram with anteroseptal and inferoapical hypokinesis and reduced LV function.  She is a long-standing type II diabetic, both  insulin requiring and on oral hypoglycemics.  The patient has typical symptoms of progressive exertional angina consistent with unstable angina.  She underwent diagnostic cardiac catheterization this morning demonstrating severe multivessel disease with critical stenosis of the LAD/first diagonal bifurcation, large OM branch of the circumflex, and PDA branch of the RCA.  Based on her coronary anatomy and severe underlying cardiovascular risk factors, I feel that she is at high risk of abrupt vessel closure and MI unless she is hospitalized and treated with therapeutic anticoagulation.  She will be admitted to the hospital to a telemetry bed, treated with IV heparin, and a cardiac surgical consultation will be placed for consideration of CABG.  If her morbid obesity and other risk factors are prohibitive for CABG, PCI could be performed albeit at somewhat higher risk because of LAD/diagonal bifurcational disease.  The patient is chest pain-free at present.  Sherren Mocha MD 08/27/2017 11:42 AM

## 2017-08-27 NOTE — Progress Notes (Signed)
ANTICOAGULATION CONSULT NOTE - Initial Consult  Pharmacy Consult for heparin  Indication: chest pain/ACS  Allergies  Allergen Reactions  . Amoxicillin Anaphylaxis and Other (See Comments)    Has patient had a PCN reaction causing immediate rash, facial/tongue/throat swelling, SOB or lightheadedness with hypotension: Yes Has patient had a PCN reaction causing severe rash involving mucus membranes or skin necrosis: Yes Has patient had a PCN reaction that required hospitalization: Yes Has patient had a PCN reaction occurring within the last 10 years: Yes If all of the above answers are "NO", then may proceed with Cephalosporin use.   [name] Hydrocodone Anaphylaxis  . Depacon [Valproic Acid] Other (See Comments)    Causes falls     Patient Measurements: Height: 5\' 4"  (162.6 cm) Weight: 266 lb (120.7 kg) IBW/kg (Calculated) : 54.7 Heparin Dosing Weight: 84kg  Vital Signs: Temp: 98 F (36.7 C) (05/15 0808) Temp Source: Oral (05/15 0808) BP: 133/45 (05/15 1425) Pulse Rate: 84 (05/15 1425)  Labs: No results for input(s): HGB, HCT, PLT, APTT, LABPROT, INR, HEPARINUNFRC, HEPRLOWMOCWT, CREATININE, CKTOTAL, CKMB, TROPONINI in the last 72 hours.  Estimated Creatinine Clearance: 86.6 mL/min (by C-G formula based on SCr of 0.84 mg/dL).   Medical History: Past Medical History:  Diagnosis Date  . Anemia   . Anxiety   . Bipolar disorder ([place])   . Bulging lumbar disc    L3-4  . Chronic daily headache   . Chronic low back pain 09/20/2014  . COPD (chronic obstructive pulmonary disease) ([place])   . Degenerative arthritis   . Depression   . Diabetes mellitus   . Diabetes mellitus, type II ([place])   . DM type 2 with diabetic peripheral neuropathy ([place]) 09/20/2014  . Dyslipidemia   . Dyspnea   . Gastroparesis   . GERD (gastroesophageal reflux disease)   . Heart murmur   . History of hiatal hernia   . Hypothyroidism   . IBS (irritable bowel syndrome)   . Memory difficulty 09/20/2014  . Morbid  obesity ([place])   . Neuropathy   . Obstructive sleep apnea on CPAP   . Restless legs syndrome (RLS) 09/17/2012  . Stroke (cerebrum) ([place]) 05/16/2017   Left parietal    Medications:  Medications Prior to Admission  Medication Sig Dispense Refill Last Dose  . aspirin EC 81 MG tablet Take 81 mg by mouth daily.   08/27/2017 at 0630  . aspirin-acetaminophen-caffeine (EXCEDRIN MIGRAINE) 250-250-65 MG tablet Take 2 tablets by mouth every 6 (six) hours as needed for headache.   Past Week at Unknown time  . baclofen (LIORESAL) 10 MG tablet TAKE ONE TABLET BY MOUTH AT BEDTIME. 30 tablet 1 08/26/2017 at Unknown time  . buPROPion (WELLBUTRIN XL) 300 MG 24 hr tablet TAKE ONE TABLET BY MOUTH EVERY MORNING. 30 tablet 2 08/27/2017 at 0630  . Charcoal Activated (CHARCOAL PO) Take 2 capsules by mouth daily as needed (for gas).    Past Month at Unknown time  . diclofenac sodium (VOLTAREN) 1 % GEL Apply 1 application topically daily as needed (for pain).    Past Week at Unknown time  . esomeprazole (NEXIUM) 40 MG capsule Take 40 mg by mouth daily.   12 08/27/2017 at 0630  . furosemide (LASIX) 40 MG tablet Take 40 mg by mouth daily as needed for fluid.   12 Past Week at Unknown time  . gabapentin (NEURONTIN) 300 MG capsule Take 900 mg by mouth 2 (two) times daily.    08/26/2017 at Unknown time  .  Insulin Glargine (TOUJEO SOLOSTAR) 300 UNIT/ML SOPN Inject 30 Units into the skin at bedtime. 13.5 mL 2 08/26/2017 at Unknown time  . ketoconazole (NIZORAL) 2 % cream Apply 1 application topically 2 (two) times daily as needed for irritation.   3 Past Week at Unknown time  . lamoTRIgine (LAMICTAL) 150 MG tablet TAKE ONE TABLET BY MOUTH AT BEDTIME. 30 tablet 2 08/26/2017 at Unknown time  . levothyroxine (SYNTHROID, LEVOTHROID) 100 MCG tablet Take 100 mcg by mouth daily before breakfast.    08/27/2017 at 0630  . losartan (COZAAR) 50 MG tablet Take 50 mg by mouth at bedtime.    08/26/2017 at Unknown time  . meclizine (ANTIVERT) 25 MG  tablet Take 1 tablet (25 mg total) by mouth 3 (three) times daily as needed for dizziness. (Patient taking differently: Take 25 mg by mouth daily as needed for dizziness. ) 30 tablet 0 Past Week at Unknown time  . metFORMIN (GLUCOPHAGE) 500 MG tablet Take 2 tablets (1,000 mg total) by mouth 2 (two) times daily with a meal. 60 tablet 3 08/24/2017  . oxymetazoline (AFRIN) 0.05 % nasal spray Place 1 spray into both nostrils as needed for congestion.   08/27/2017 at 0630  . promethazine (PHENERGAN) 25 MG tablet Take 25 mg by mouth at bedtime as needed for nausea or vomiting.    Past Month at Unknown time  . rOPINIRole (REQUIP) 2 MG tablet Take 2-4 mg by mouth See admin instructions. Takes 2 mg by mouth in the morning and 4 mg by mouth in the evening   08/27/2017 at 0630  . traMADol (ULTRAM) 50 MG tablet Take 2 tablets (100 mg total) by mouth every 8 (eight) hours as needed for pain. (Patient taking differently: Take 100 mg by mouth at bedtime as needed for moderate pain. ) 180 tablet 5 Past Month at Unknown time  . traZODone (DESYREL) 50 MG tablet Take 50 mg by mouth at bedtime.  5 Past Week at Unknown time  . venlafaxine XR (EFFEXOR-XR) 150 MG 24 hr capsule TAKE TWO (2) CAPSULES BY MOUTH AT BEDTIME. (Patient taking differently: TAKE 300 MG BY MOUTH AT BEDTIME) 60 capsule 2 08/26/2017 at Unknown time  . albuterol (PROVENTIL HFA;VENTOLIN HFA) 108 (90 Base) MCG/ACT inhaler Inhale 2 puffs into the lungs every 6 (six) hours as needed for wheezing or shortness of breath.   More than a month at Unknown time  . EPINEPHrine (EPIPEN 2-PAK) 0.3 mg/0.3 mL IJ SOAJ injection Inject 0.3 mg into the muscle once.   Unknown at Unknown time  . Insulin Pen Needle (LITE TOUCH PEN NEEDLES) 31G X 5 MM MISC USE ONE DAILY. (Patient not taking: Reported on 08/26/2017) 100 each 5 Not Taking at Unknown time  . Polyvinyl Alcohol-Povidone (REFRESH OP) Place 2 drops into both eyes daily as needed (for dry eyes).   More than a month at Unknown  time    Assessment: 67 yo female with CP now s/p cath with multivessel CAD for CABG consult. Pharmacy consulted to doe heparin to begin 4hrs post TR band removal (sheath removed ~ 11:30am)  Goal of Therapy:  Heparin level 0.3-0.7 units/ml Monitor platelets by anticoagulation protocol: Yes   Plan:  -begin heparin at 1150 units/hr 4 hours post sheath removal -Heparin level in 6 hours and daily wth CBC daily  Hildred Laser, PharmD Clinical Pharmacist Clinical phone from 8:30-4:00 is [phone] After 4pm, please call Main Rx [phone]) for assistance. 08/27/2017 2:38 PM

## 2017-08-28 ENCOUNTER — Encounter (HOSPITAL_COMMUNITY): Payer: Self-pay | Admitting: Cardiology

## 2017-08-28 ENCOUNTER — Other Ambulatory Visit: Payer: Self-pay | Admitting: *Deleted

## 2017-08-28 ENCOUNTER — Inpatient Hospital Stay (HOSPITAL_COMMUNITY): Payer: PPO

## 2017-08-28 DIAGNOSIS — G4733 Obstructive sleep apnea (adult) (pediatric): Secondary | ICD-10-CM

## 2017-08-28 DIAGNOSIS — Z01810 Encounter for preprocedural cardiovascular examination: Secondary | ICD-10-CM

## 2017-08-28 DIAGNOSIS — E785 Hyperlipidemia, unspecified: Secondary | ICD-10-CM

## 2017-08-28 DIAGNOSIS — E119 Type 2 diabetes mellitus without complications: Secondary | ICD-10-CM

## 2017-08-28 DIAGNOSIS — I25110 Atherosclerotic heart disease of native coronary artery with unstable angina pectoris: Secondary | ICD-10-CM

## 2017-08-28 DIAGNOSIS — E1169 Type 2 diabetes mellitus with other specified complication: Secondary | ICD-10-CM

## 2017-08-28 DIAGNOSIS — I2510 Atherosclerotic heart disease of native coronary artery without angina pectoris: Secondary | ICD-10-CM

## 2017-08-28 DIAGNOSIS — Z794 Long term (current) use of insulin: Secondary | ICD-10-CM

## 2017-08-28 DIAGNOSIS — I10 Essential (primary) hypertension: Secondary | ICD-10-CM

## 2017-08-28 DIAGNOSIS — I200 Unstable angina: Secondary | ICD-10-CM

## 2017-08-28 LAB — CBC
HCT: 33.8 % — ABNORMAL LOW (ref 36.0–46.0)
HEMOGLOBIN: 10.8 g/dL — AB (ref 12.0–15.0)
MCH: 28.7 pg (ref 26.0–34.0)
MCHC: 32 g/dL (ref 30.0–36.0)
MCV: 89.9 fL (ref 78.0–100.0)
Platelets: 340 10*3/uL (ref 150–400)
RBC: 3.76 MIL/uL — AB (ref 3.87–5.11)
RDW: 14.2 % (ref 11.5–15.5)
WBC: 10.9 10*3/uL — ABNORMAL HIGH (ref 4.0–10.5)

## 2017-08-28 LAB — PULMONARY FUNCTION TEST
DL/VA % pred: 96 %
DL/VA: 4.62 ml/min/mmHg/L
DLCO unc % pred: 86 %
DLCO unc: 21.01 ml/min/mmHg
FEF 25-75 Post: 3.04 L/sec
FEF 25-75 Pre: 2.86 L/sec
FEF2575-%Change-Post: 6 %
FEF2575-%Pred-Post: 140 %
FEF2575-%Pred-Pre: 132 %
FEV1-%Change-Post: 2 %
FEV1-%Pred-Post: 100 %
FEV1-%Pred-Pre: 98 %
FEV1-Post: 2.45 L
FEV1-Pre: 2.4 L
FEV1FVC-%Change-Post: 6 %
FEV1FVC-%Pred-Pre: 106 %
FEV6-%Change-Post: -3 %
FEV6-%Pred-Post: 92 %
FEV6-%Pred-Pre: 96 %
FEV6-Post: 2.82 L
FEV6-Pre: 2.93 L
FEV6FVC-%Pred-Post: 104 %
FEV6FVC-%Pred-Pre: 104 %
FVC-%Change-Post: -3 %
FVC-%Pred-Post: 88 %
FVC-%Pred-Pre: 92 %
FVC-Post: 2.82 L
FVC-Pre: 2.93 L
Post FEV1/FVC ratio: 87 %
Post FEV6/FVC ratio: 100 %
Pre FEV1/FVC ratio: 82 %
Pre FEV6/FVC Ratio: 100 %
RV % pred: 83 %
RV: 1.75 L
TLC % pred: 93 %
TLC: 4.72 L

## 2017-08-28 LAB — COMPREHENSIVE METABOLIC PANEL
ALT: 14 U/L (ref 14–54)
AST: 16 U/L (ref 15–41)
Albumin: 3.1 g/dL — ABNORMAL LOW (ref 3.5–5.0)
Alkaline Phosphatase: 51 U/L (ref 38–126)
Anion gap: 9 (ref 5–15)
BUN: 7 mg/dL (ref 6–20)
CO2: 28 mmol/L (ref 22–32)
Calcium: 8.9 mg/dL (ref 8.9–10.3)
Chloride: 103 mmol/L (ref 101–111)
Creatinine, Ser: 0.86 mg/dL (ref 0.44–1.00)
GFR calc Af Amer: >60 mL/min (ref >60)
GFR calc non Af Amer: >60 mL/min (ref >60)
Glucose, Bld: 124 mg/dL — ABNORMAL HIGH (ref 65–99)
Potassium: 3.9 mmol/L (ref 3.5–5.1)
Sodium: 140 mmol/L (ref 135–145)
Total Bilirubin: 0.4 mg/dL (ref 0.3–1.2)
Total Protein: 6.7 g/dL (ref 6.5–8.1)

## 2017-08-28 LAB — BLOOD GAS, ARTERIAL
Acid-Base Excess: 2.3 mmol/L — ABNORMAL HIGH (ref 0.0–2.0)
Bicarbonate: 26.3 mmol/L (ref 20.0–28.0)
Drawn by: 53309
FIO2: 21
O2 Saturation: 94.8 %
Patient temperature: 98.6
pCO2 arterial: 40.4 mmHg (ref 32.0–48.0)
pH, Arterial: 7.429 (ref 7.350–7.450)
pO2, Arterial: 76.7 mmHg — ABNORMAL LOW (ref 83.0–108.0)

## 2017-08-28 LAB — HEMOGLOBIN A1C
Hgb A1c MFr Bld: 7.2 % — ABNORMAL HIGH (ref 4.8–5.6)
Mean Plasma Glucose: 159.94 mg/dL

## 2017-08-28 LAB — GLUCOSE, CAPILLARY
GLUCOSE-CAPILLARY: 127 mg/dL — AB (ref 65–99)
Glucose-Capillary: 138 mg/dL — ABNORMAL HIGH (ref 65–99)
Glucose-Capillary: 138 mg/dL — ABNORMAL HIGH (ref 65–99)

## 2017-08-28 LAB — HEPARIN LEVEL (UNFRACTIONATED)
Heparin Unfractionated: 0.1 IU/mL — ABNORMAL LOW (ref 0.30–0.70)
Heparin Unfractionated: 0.13 IU/mL — ABNORMAL LOW (ref 0.30–0.70)
Heparin Unfractionated: 0.28 IU/mL — ABNORMAL LOW (ref 0.30–0.70)

## 2017-08-28 LAB — PROTIME-INR
INR: 1.05
PROTHROMBIN TIME: 13.6 s (ref 11.4–15.2)

## 2017-08-28 LAB — APTT: aPTT: 55 seconds — ABNORMAL HIGH (ref 24–36)

## 2017-08-28 LAB — PREPARE RBC (CROSSMATCH)

## 2017-08-28 LAB — ABO/RH: ABO/RH(D): AB NEG

## 2017-08-28 MED ORDER — CHLORHEXIDINE GLUCONATE 4 % EX LIQD
60.0000 mL | Freq: Once | CUTANEOUS | Status: AC
  Administered 2017-08-29: 4 via TOPICAL
  Filled 2017-08-28: qty 45

## 2017-08-28 MED ORDER — SODIUM CHLORIDE 0.9 % IV SOLN
1.5000 mg/kg/h | INTRAVENOUS | Status: DC
  Filled 2017-08-28: qty 25

## 2017-08-28 MED ORDER — MAGNESIUM SULFATE 50 % IJ SOLN
40.0000 meq | INTRAMUSCULAR | Status: DC
  Filled 2017-08-28: qty 9.85

## 2017-08-28 MED ORDER — TEMAZEPAM 15 MG PO CAPS: 15.0000 mg | ORAL_CAPSULE | Freq: Once | ORAL | Status: DC | PRN

## 2017-08-28 MED ORDER — EPINEPHRINE PF 1 MG/ML IJ SOLN
0.0000 ug/min | INTRAVENOUS | Status: DC
  Filled 2017-08-28: qty 4

## 2017-08-28 MED ORDER — CHLORHEXIDINE GLUCONATE 0.12 % MT SOLN
15.0000 mL | Freq: Once | OROMUCOSAL | Status: AC
  Administered 2017-08-29: 15 mL via OROMUCOSAL
  Filled 2017-08-28: qty 15

## 2017-08-28 MED ORDER — ALPRAZOLAM 0.25 MG PO TABS: 0.2500 mg | ORAL_TABLET | ORAL | Status: DC | PRN

## 2017-08-28 MED ORDER — NITROGLYCERIN IN D5W 200-5 MCG/ML-% IV SOLN
2.0000 ug/min | INTRAVENOUS | Status: DC
  Filled 2017-08-28: qty 250

## 2017-08-28 MED ORDER — TRANEXAMIC ACID (OHS) BOLUS VIA INFUSION
15.0000 mg/kg | INTRAVENOUS | Status: DC
  Filled 2017-08-28: qty 1758

## 2017-08-28 MED ORDER — MILRINONE LACTATE IN DEXTROSE 20-5 MG/100ML-% IV SOLN
0.1250 ug/kg/min | INTRAVENOUS | Status: DC
  Filled 2017-08-28: qty 100

## 2017-08-28 MED ORDER — CHLORHEXIDINE GLUCONATE 4 % EX LIQD
60.0000 mL | Freq: Once | CUTANEOUS | Status: AC
  Administered 2017-08-28: 4 via TOPICAL
  Filled 2017-08-28: qty 15

## 2017-08-28 MED ORDER — DOPAMINE-DEXTROSE 3.2-5 MG/ML-% IV SOLN
0.0000 ug/kg/min | INTRAVENOUS | Status: DC
  Filled 2017-08-28: qty 250

## 2017-08-28 MED ORDER — PLASMA-LYTE 148 IV SOLN
INTRAVENOUS | Status: DC
  Filled 2017-08-28: qty 2.5

## 2017-08-28 MED ORDER — SALINE SPRAY 0.65 % NA SOLN
1.0000 | NASAL | Status: DC | PRN
  Administered 2017-08-28: 1 via NASAL
  Filled 2017-08-28: qty 44

## 2017-08-28 MED ORDER — METOPROLOL TARTRATE 12.5 MG HALF TABLET
12.5000 mg | ORAL_TABLET | Freq: Two times a day (BID) | ORAL | Status: DC
  Administered 2017-08-28: 12.5 mg via ORAL
  Filled 2017-08-28: qty 1

## 2017-08-28 MED ORDER — PHENYLEPHRINE HCL 10 MG/ML IJ SOLN
30.0000 ug/min | INTRAMUSCULAR | Status: DC
  Filled 2017-08-28: qty 2

## 2017-08-28 MED ORDER — METOPROLOL TARTRATE 12.5 MG HALF TABLET
12.5000 mg | ORAL_TABLET | Freq: Once | ORAL | Status: AC
  Administered 2017-08-29: 12.5 mg via ORAL
  Filled 2017-08-28: qty 1

## 2017-08-28 MED ORDER — POTASSIUM CHLORIDE 2 MEQ/ML IV SOLN
80.0000 meq | INTRAVENOUS | Status: DC
  Filled 2017-08-28: qty 40

## 2017-08-28 MED ORDER — TRANEXAMIC ACID (OHS) PUMP PRIME SOLUTION
2.0000 mg/kg | INTRAVENOUS | Status: DC
  Filled 2017-08-28: qty 2.34

## 2017-08-28 MED ORDER — FLUTICASONE PROPIONATE 50 MCG/ACT NA SUSP
1.0000 | Freq: Every day | NASAL | Status: DC | PRN
Start: 2017-08-28 — End: 2017-08-29
  Filled 2017-08-28: qty 16

## 2017-08-28 MED ORDER — VANCOMYCIN HCL 10 G IV SOLR
1500.0000 mg | INTRAVENOUS | Status: DC
  Filled 2017-08-28: qty 1500

## 2017-08-28 MED ORDER — SODIUM CHLORIDE 0.9 % IV SOLN
INTRAVENOUS | Status: DC
  Filled 2017-08-28: qty 30

## 2017-08-28 MED ORDER — SODIUM CHLORIDE 0.9 % IV SOLN
INTRAVENOUS | Status: DC
  Filled 2017-08-28: qty 1

## 2017-08-28 MED ORDER — ROPINIROLE HCL 1 MG PO TABS
2.0000 mg | ORAL_TABLET | Freq: Once | ORAL | Status: AC
  Administered 2017-08-29: 2 mg via ORAL
  Filled 2017-08-28: qty 2

## 2017-08-28 MED ORDER — DEXMEDETOMIDINE HCL IN NACL 400 MCG/100ML IV SOLN
0.1000 ug/kg/h | INTRAVENOUS | Status: DC
  Filled 2017-08-28: qty 100

## 2017-08-28 MED ORDER — LEVOFLOXACIN IN D5W 500 MG/100ML IV SOLN
500.0000 mg | INTRAVENOUS | Status: DC
  Filled 2017-08-28: qty 100

## 2017-08-28 MED ORDER — BISACODYL 5 MG PO TBEC
5.0000 mg | DELAYED_RELEASE_TABLET | Freq: Once | ORAL | Status: AC
  Administered 2017-08-28: 5 mg via ORAL
  Filled 2017-08-28: qty 1

## 2017-08-28 MED FILL — Heparin Sod (Porcine)-NaCl IV Soln 1000 Unit/500ML-0.9%: INTRAVENOUS | Qty: 1000 | Status: AC

## 2017-08-28 NOTE — Progress Notes (Signed)
Pre-op Cardiac Surgery  Carotid duplex completed 06/06/17, ABI completed 07/11/17. Results are available in [facility] for review.  Upper Extremity Right Left  Brachial Pressures 129-Triphasic 122-Triphasic  Radial Waveforms Triphasic Triphasic  Ulnar Waveforms Triphasic Triphasic  Palmar Arch (Allen's Test) Within normal limits Signal diminishes >50% with radial compression, and is unaffected with ulnar compression.   08/28/2017 2:47 PM Maudry Mayhew, BS, RVT, RDCS, RDMS

## 2017-08-28 NOTE — Progress Notes (Signed)
1330-1405 Gave pt [facility] booklet, care guide and in the tube handout. Wrote down how to view pre op video. Pt stated she can only walk very short distances and will need Rehab at discharge. Told her that SW and case manager will speak with her after surgery for discharge needs. Discussed sternal precautions. Gave IS and pt demonstrated 750 ml correctly.  Did not walk with pt as transporter here for test. Graylon Good RN BSN 08/28/2017 2:07 PM

## 2017-08-28 NOTE — Progress Notes (Signed)
[place] for heparin  Indication: chest pain/ACS  Allergies  Allergen Reactions  . Amoxicillin Anaphylaxis and Other (See Comments)    Has patient had a PCN reaction causing immediate rash, facial/tongue/throat swelling, SOB or lightheadedness with hypotension: Yes Has patient had a PCN reaction causing severe rash involving mucus membranes or skin necrosis: Yes Has patient had a PCN reaction that required hospitalization: Yes Has patient had a PCN reaction occurring within the last 10 years: Yes If all of the above answers are "NO", then may proceed with Cephalosporin use.   [name] Hydrocodone Anaphylaxis  . Depacon [Valproic Acid] Other (See Comments)    Causes falls     Patient Measurements: Height: 5\' 4"  (162.6 cm) Weight: 258 lb 6.4 oz (117.2 kg) IBW/kg (Calculated) : 54.7 Heparin Dosing Weight: 84kg  Vital Signs: Temp: 98.6 F (37 C) (05/16 0348) Temp Source: Oral (05/16 0348) BP: 129/49 (05/16 0348) Pulse Rate: 93 (05/16 0348)  Labs: Recent Labs    08/28/17 0525  HGB 10.8*  HCT 33.8*  PLT 340  HEPARINUNFRC 0.10*    Estimated Creatinine Clearance: 85.1 mL/min (by C-G formula based on SCr of 0.84 mg/dL).   Medical History: Past Medical History:  Diagnosis Date  . Anemia   . Anxiety   . Bipolar disorder ([place])   . Bulging lumbar disc    L3-4  . Chronic daily headache   . Chronic low back pain 09/20/2014  . COPD (chronic obstructive pulmonary disease) ([place])   . Degenerative arthritis   . Depression   . Diabetes mellitus   . Diabetes mellitus, type II ([place])   . DM type 2 with diabetic peripheral neuropathy ([place]) 09/20/2014  . Dyslipidemia   . Dyspnea   . Gastroparesis   . GERD (gastroesophageal reflux disease)   . Heart murmur   . History of hiatal hernia   . Hypothyroidism   . IBS (irritable bowel syndrome)   . Memory difficulty 09/20/2014  . Morbid obesity ([place])   . Neuropathy   . Obstructive sleep apnea on CPAP    . Restless legs syndrome (RLS) 09/17/2012  . Stroke (cerebrum) ([place]) 05/16/2017   Left parietal    Medications:  Medications Prior to Admission  Medication Sig Dispense Refill Last Dose  . aspirin EC 81 MG tablet Take 81 mg by mouth daily.   08/27/2017 at 0630  . aspirin-acetaminophen-caffeine (EXCEDRIN MIGRAINE) 250-250-65 MG tablet Take 2 tablets by mouth every 6 (six) hours as needed for headache.   Past Week at Unknown time  . baclofen (LIORESAL) 10 MG tablet TAKE ONE TABLET BY MOUTH AT BEDTIME. 30 tablet 1 08/26/2017 at Unknown time  . buPROPion (WELLBUTRIN XL) 300 MG 24 hr tablet TAKE ONE TABLET BY MOUTH EVERY MORNING. 30 tablet 2 08/27/2017 at 0630  . Charcoal Activated (CHARCOAL PO) Take 2 capsules by mouth daily as needed (for gas).    Past Month at Unknown time  . diclofenac sodium (VOLTAREN) 1 % GEL Apply 1 application topically daily as needed (for pain).    Past Week at Unknown time  . esomeprazole (NEXIUM) 40 MG capsule Take 40 mg by mouth daily.   12 08/27/2017 at 0630  . furosemide (LASIX) 40 MG tablet Take 40 mg by mouth daily as needed for fluid.   12 Past Week at Unknown time  . gabapentin (NEURONTIN) 300 MG capsule Take 900 mg by mouth 2 (two) times daily.    08/26/2017 at Unknown time  .  Insulin Glargine (TOUJEO SOLOSTAR) 300 UNIT/ML SOPN Inject 30 Units into the skin at bedtime. 13.5 mL 2 08/26/2017 at Unknown time  . ketoconazole (NIZORAL) 2 % cream Apply 1 application topically 2 (two) times daily as needed for irritation.   3 Past Week at Unknown time  . lamoTRIgine (LAMICTAL) 150 MG tablet TAKE ONE TABLET BY MOUTH AT BEDTIME. 30 tablet 2 08/26/2017 at Unknown time  . levothyroxine (SYNTHROID, LEVOTHROID) 100 MCG tablet Take 100 mcg by mouth daily before breakfast.    08/27/2017 at 0630  . losartan (COZAAR) 50 MG tablet Take 50 mg by mouth at bedtime.    08/26/2017 at Unknown time  . meclizine (ANTIVERT) 25 MG tablet Take 1 tablet (25 mg total) by mouth 3 (three) times daily as  needed for dizziness. (Patient taking differently: Take 25 mg by mouth daily as needed for dizziness. ) 30 tablet 0 Past Week at Unknown time  . metFORMIN (GLUCOPHAGE) 500 MG tablet Take 2 tablets (1,000 mg total) by mouth 2 (two) times daily with a meal. 60 tablet 3 08/24/2017  . oxymetazoline (AFRIN) 0.05 % nasal spray Place 1 spray into both nostrils as needed for congestion.   08/27/2017 at 0630  . promethazine (PHENERGAN) 25 MG tablet Take 25 mg by mouth at bedtime as needed for nausea or vomiting.    Past Month at Unknown time  . rOPINIRole (REQUIP) 2 MG tablet Take 2-4 mg by mouth See admin instructions. Takes 2 mg by mouth in the morning and 4 mg by mouth in the evening   08/27/2017 at 0630  . traMADol (ULTRAM) 50 MG tablet Take 2 tablets (100 mg total) by mouth every 8 (eight) hours as needed for pain. (Patient taking differently: Take 100 mg by mouth at bedtime as needed for moderate pain. ) 180 tablet 5 Past Month at Unknown time  . traZODone (DESYREL) 50 MG tablet Take 50 mg by mouth at bedtime.  5 Past Week at Unknown time  . venlafaxine XR (EFFEXOR-XR) 150 MG 24 hr capsule TAKE TWO (2) CAPSULES BY MOUTH AT BEDTIME. (Patient taking differently: TAKE 300 MG BY MOUTH AT BEDTIME) 60 capsule 2 08/26/2017 at Unknown time  . albuterol (PROVENTIL HFA;VENTOLIN HFA) 108 (90 Base) MCG/ACT inhaler Inhale 2 puffs into the lungs every 6 (six) hours as needed for wheezing or shortness of breath.   More than a month at Unknown time  . EPINEPHrine (EPIPEN 2-PAK) 0.3 mg/0.3 mL IJ SOAJ injection Inject 0.3 mg into the muscle once.   Unknown at Unknown time  . Insulin Pen Needle (LITE TOUCH PEN NEEDLES) 31G X 5 MM MISC USE ONE DAILY. (Patient not taking: Reported on 08/26/2017) 100 each 5 Not Taking at Unknown time  . Polyvinyl Alcohol-Povidone (REFRESH OP) Place 2 drops into both eyes daily as needed (for dry eyes).   More than a month at Unknown time    Assessment: 67 yo female with CP now s/p cath with  multivessel CAD for CABG consult. Pharmacy consulted to dose heparin to begin 4hrs post TR band removal (sheath removed ~ 11:30am)  Initial heparin level is 0.1 units/ml  Goal of Therapy:  Heparin level 0.3-0.7 units/ml Monitor platelets by anticoagulation protocol: Yes   Plan:  -Increase heparin to 1300 units/hr -Heparin level in 6 hours  Thanks for allowing pharmacy to be a part of this patient's care.  Excell Seltzer, PharmD Clinical Pharmacist

## 2017-08-28 NOTE — Progress Notes (Signed)
[place] for heparin  Indication: chest pain/ACS  Allergies  Allergen Reactions  . Amoxicillin Anaphylaxis and Other (See Comments)    Has patient had a PCN reaction causing immediate rash, facial/tongue/throat swelling, SOB or lightheadedness with hypotension: Yes Has patient had a PCN reaction causing severe rash involving mucus membranes or skin necrosis: Yes Has patient had a PCN reaction that required hospitalization: Yes Has patient had a PCN reaction occurring within the last 10 years: Yes If all of the above answers are "NO", then may proceed with Cephalosporin use.   [name] Hydrocodone Anaphylaxis  . Depacon [Valproic Acid] Other (See Comments)    Causes falls     Patient Measurements: Height: 5\' 4"  (162.6 cm) Weight: 258 lb 6.4 oz (117.2 kg) IBW/kg (Calculated) : 54.7 Heparin Dosing Weight: 84kg  Vital Signs: Temp: 97.8 F (36.6 C) (05/16 1135) Temp Source: Oral (05/16 0348) BP: 129/49 (05/16 0348) Pulse Rate: 93 (05/16 0348)  Labs: Recent Labs    08/28/17 0525 08/28/17 1156  HGB 10.8*  --   HCT 33.8*  --   PLT 340  --   LABPROT  --  13.6  INR  --  1.05  HEPARINUNFRC 0.10* 0.13*  CREATININE  --  0.86    Estimated Creatinine Clearance: 83.1 mL/min (by C-G formula based on SCr of 0.86 mg/dL).   Medical History: Past Medical History:  Diagnosis Date  . Anemia   . Anxiety   . Bipolar disorder ([place])   . Bulging lumbar disc    L3-4  . Chronic daily headache   . Chronic low back pain 09/20/2014  . COPD (chronic obstructive pulmonary disease) ([place])   . Degenerative arthritis   . Depression   . Diabetes mellitus, type II ([place])   . DM type 2 with diabetic peripheral neuropathy ([place]) 09/20/2014  . Dyslipidemia   . Dyspnea   . Gastroparesis   . GERD (gastroesophageal reflux disease)   . Heart murmur   . History of hiatal hernia   . Hypothyroidism   . IBS (irritable bowel syndrome)   . Memory difficulty 09/20/2014  .  Morbid obesity ([place])   . Neuropathy   . Obstructive sleep apnea on CPAP   . Restless legs syndrome (RLS) 09/17/2012  . Stroke (cerebrum) ([place]) 05/16/2017   Left parietal    Medications:  Medications Prior to Admission  Medication Sig Dispense Refill Last Dose  . aspirin EC 81 MG tablet Take 81 mg by mouth daily.   08/27/2017 at 0630  . aspirin-acetaminophen-caffeine (EXCEDRIN MIGRAINE) 250-250-65 MG tablet Take 2 tablets by mouth every 6 (six) hours as needed for headache.   Past Week at Unknown time  . baclofen (LIORESAL) 10 MG tablet TAKE ONE TABLET BY MOUTH AT BEDTIME. 30 tablet 1 08/26/2017 at Unknown time  . buPROPion (WELLBUTRIN XL) 300 MG 24 hr tablet TAKE ONE TABLET BY MOUTH EVERY MORNING. 30 tablet 2 08/27/2017 at 0630  . Charcoal Activated (CHARCOAL PO) Take 2 capsules by mouth daily as needed (for gas).    Past Month at Unknown time  . diclofenac sodium (VOLTAREN) 1 % GEL Apply 1 application topically daily as needed (for pain).    Past Week at Unknown time  . esomeprazole (NEXIUM) 40 MG capsule Take 40 mg by mouth daily.   12 08/27/2017 at 0630  . furosemide (LASIX) 40 MG tablet Take 40 mg by mouth daily as needed for fluid.   12 Past Week at Unknown time  .  gabapentin (NEURONTIN) 300 MG capsule Take 900 mg by mouth 2 (two) times daily.    08/26/2017 at Unknown time  . Insulin Glargine (TOUJEO SOLOSTAR) 300 UNIT/ML SOPN Inject 30 Units into the skin at bedtime. 13.5 mL 2 08/26/2017 at Unknown time  . ketoconazole (NIZORAL) 2 % cream Apply 1 application topically 2 (two) times daily as needed for irritation.   3 Past Week at Unknown time  . lamoTRIgine (LAMICTAL) 150 MG tablet TAKE ONE TABLET BY MOUTH AT BEDTIME. 30 tablet 2 08/26/2017 at Unknown time  . levothyroxine (SYNTHROID, LEVOTHROID) 100 MCG tablet Take 100 mcg by mouth daily before breakfast.    08/27/2017 at 0630  . losartan (COZAAR) 50 MG tablet Take 50 mg by mouth at bedtime.    08/26/2017 at Unknown time  . meclizine (ANTIVERT) 25  MG tablet Take 1 tablet (25 mg total) by mouth 3 (three) times daily as needed for dizziness. (Patient taking differently: Take 25 mg by mouth daily as needed for dizziness. ) 30 tablet 0 Past Week at Unknown time  . metFORMIN (GLUCOPHAGE) 500 MG tablet Take 2 tablets (1,000 mg total) by mouth 2 (two) times daily with a meal. 60 tablet 3 08/24/2017  . oxymetazoline (AFRIN) 0.05 % nasal spray Place 1 spray into both nostrils as needed for congestion.   08/27/2017 at 0630  . promethazine (PHENERGAN) 25 MG tablet Take 25 mg by mouth at bedtime as needed for nausea or vomiting.    Past Month at Unknown time  . rOPINIRole (REQUIP) 2 MG tablet Take 2-4 mg by mouth See admin instructions. Takes 2 mg by mouth in the morning and 4 mg by mouth in the evening   08/27/2017 at 0630  . traMADol (ULTRAM) 50 MG tablet Take 2 tablets (100 mg total) by mouth every 8 (eight) hours as needed for pain. (Patient taking differently: Take 100 mg by mouth at bedtime as needed for moderate pain. ) 180 tablet 5 Past Month at Unknown time  . traZODone (DESYREL) 50 MG tablet Take 50 mg by mouth at bedtime.  5 Past Week at Unknown time  . venlafaxine XR (EFFEXOR-XR) 150 MG 24 hr capsule TAKE TWO (2) CAPSULES BY MOUTH AT BEDTIME. (Patient taking differently: TAKE 300 MG BY MOUTH AT BEDTIME) 60 capsule 2 08/26/2017 at Unknown time  . albuterol (PROVENTIL HFA;VENTOLIN HFA) 108 (90 Base) MCG/ACT inhaler Inhale 2 puffs into the lungs every 6 (six) hours as needed for wheezing or shortness of breath.   More than a month at Unknown time  . EPINEPHrine (EPIPEN 2-PAK) 0.3 mg/0.3 mL IJ SOAJ injection Inject 0.3 mg into the muscle once.   Unknown at Unknown time  . Insulin Pen Needle (LITE TOUCH PEN NEEDLES) 31G X 5 MM MISC USE ONE DAILY. (Patient not taking: Reported on 08/26/2017) 100 each 5 Not Taking at Unknown time  . Polyvinyl Alcohol-Povidone (REFRESH OP) Place 2 drops into both eyes daily as needed (for dry eyes).   More than a month at  Unknown time    Assessment: 67 yo female s/p cath with multivessel CAD awaiting CABG 5/17. Pharmacy resumed heparin yesterday evening post cath.   Heparin level subtherapeutic: 0.13, no infusion issues or overt bleeding reported by RN  Goal of Therapy:  Heparin level 0.3-0.7 units/ml Monitor platelets by anticoagulation protocol: Yes   Plan:  -Increase heparin to 1550 units/hr -Heparin level in 6 hours -Monitor for s/sx of bleeding  Thanks for allowing pharmacy to be a part of  this patient's care.  Georga Bora, PharmD Clinical Pharmacist 08/28/2017 2:05 PM

## 2017-08-28 NOTE — Progress Notes (Addendum)
I discussed / reviewed the pharmacy note by Ms. Raffi and I agree with the findings and plans as documented.  Orders have been placed accordingly.    [employer], Pharm.D., BCPS Clinical Pharmacist 08/28/2017 10:05 PM    ANTICOAGULATION CONSULT NOTE - Follow Up Consult  Pharmacy Consult for Heparin Indication: chest pain/ACS  Allergies  Allergen Reactions  . Amoxicillin Anaphylaxis and Other (See Comments)    Has patient had a PCN reaction causing immediate rash, facial/tongue/throat swelling, SOB or lightheadedness with hypotension: Yes Has patient had a PCN reaction causing severe rash involving mucus membranes or skin necrosis: Yes Has patient had a PCN reaction that required hospitalization: Yes Has patient had a PCN reaction occurring within the last 10 years: Yes If all of the above answers are "NO", then may proceed with Cephalosporin use.   [name] Hydrocodone Anaphylaxis  . Depacon [Valproic Acid] Other (See Comments)    Causes falls     Patient Measurements: Height: 5\' 4"  (162.6 cm) Weight: 258 lb 6.4 oz (117.2 kg) IBW/kg (Calculated) : 54.7 Heparin Dosing Weight: 84.1 kg  Vital Signs: Temp: 98.3 F (36.8 C) (05/16 1509) Temp Source: Oral (05/16 1509) BP: 127/55 (05/16 1509) Pulse Rate: 79 (05/16 1509)  Labs: Recent Labs    08/28/17 0525 08/28/17 1156 08/28/17 1924  HGB 10.8*  --   --   HCT 33.8*  --   --   PLT 340  --   --   APTT  --   --  55*  LABPROT  --  13.6  --   INR  --  1.05  --   HEPARINUNFRC 0.10* 0.13* 0.28*  CREATININE  --  0.86  --     Estimated Creatinine Clearance: 83.1 mL/min (by C-G formula based on SCr of 0.86 mg/dL).   Medications:  Infusions:  . sodium chloride    . [START ON 08/29/2017] dexmedetomidine    . [START ON 08/29/2017] DOPamine    . [START ON 08/29/2017] epinephrine    . [START ON 08/29/2017] heparin 30,000 units/NS 1000 mL solution for CELLSAVER    . heparin 1,550 Units/hr (08/28/17 1454)  . [START ON 08/29/2017]  insulin (NOVOLIN-R) infusion    . [START ON 08/29/2017] levofloxacin (LEVAQUIN) IV    . [START ON 08/29/2017] milrinone    . [START ON 08/29/2017] nitroGLYCERIN    . [START ON 08/29/2017] phenylephrine 20mg /247mL NS (0.08mg /ml) infusion    . [START ON 08/29/2017] tranexamic acid (CYKLOKAPRON) infusion ([facility])    . [START ON 08/29/2017] vancomycin      Assessment: 87 yof presents with unstable angina. S/p cath with multivessel CAD awaiting CABG 5/17. Heparin infusion was resumed post cath. Heparin levels is slightly sub therapeutic on 1550 units/hr.  Will increase heparin accordingly.  Noted plans for CABG in AM.  Will not recheck heparin level in AM as it will be stopped prior to surgery.  Goal of Therapy:  Heparin level 0.3-0.7 units/ml Monitor platelets by anticoagulation protocol: Yes   Plan:  Increase heparin infusion to 1700 units/hr  CABG in AM. Monitor for s/sx of bleeding.   [place], PharmD Student 08/28/2017,9:35 PM

## 2017-08-28 NOTE — Progress Notes (Signed)
Progress Note  Patient Name: Norma [name] Date of Encounter: 08/28/2017  Primary Cardiologist: Carlyle Dolly, MD   Subjective   Resting comfortably now.  She did have an episode of chest discomfort while going to the bathroom last night.  She had previously noted having chest pain when lying down, but has not had any since starting the heparin.  No breathing issues.  However she did ask about her CPAP.   Inpatient Medications    Scheduled Meds: . aspirin EC  81 mg Oral Daily  . baclofen  10 mg Oral QHS  . buPROPion  300 mg Oral q morning - 10a  . gabapentin  900 mg Oral BID  . insulin aspart  0-15 Units Subcutaneous TID WC  . insulin glargine  30 Units Subcutaneous QHS  . lamoTRIgine  150 mg Oral QHS  . levothyroxine  100 mcg Oral QAC breakfast  . losartan  50 mg Oral QHS  . pantoprazole  40 mg Oral Daily  . rOPINIRole  2 mg Oral Daily   And  . rOPINIRole  4 mg Oral QHS  . sodium chloride flush  3 mL Intravenous Q12H  . traZODone  50 mg Oral QHS  . venlafaxine XR  300 mg Oral QHS   Continuous Infusions: . sodium chloride    . heparin 1,300 Units/hr (08/28/17 0640)   PRN Meds: sodium chloride, acetaminophen, albuterol, ketoconazole, meclizine, ondansetron (ZOFRAN) IV, promethazine, sodium chloride, sodium chloride flush, traMADol   Vital Signs    Vitals:   08/27/17 1446 08/27/17 1951 08/27/17 2333 08/28/17 0348  BP: 131/63 93/65  (!) 129/49  Pulse: 85 80 81 93  Resp: 18 16 17  (!) 25  Temp: 98.5 F (36.9 C) 98.7 F (37.1 C)  98.6 F (37 C)  TempSrc: Oral Oral  Oral  SpO2: 98% 97% 96% 95%  Weight:    258 lb 6.4 oz (117.2 kg)  Height:        Intake/Output Summary (Last 24 hours) at 08/28/2017 1008 Last data filed at 08/27/2017 1721 Gross per 24 hour  Intake 240 ml  Output -  Net 240 ml   Filed Weights   08/27/17 0808 08/28/17 0348  Weight: 266 lb (120.7 kg) 258 lb 6.4 oz (117.2 kg)    Telemetry     - Personally Reviewed  ECG    No new EKG-  Personally Reviewed  Physical Exam   GEN: No acute distress.  Morbidly obese.  Pleasant mood and affect. Neck: No JVD Cardiac: RRR, no rubs, or gallops.  1-2/6 SEM at RUSB.  Distant S1 and S2. Respiratory: Clear to auscultation bilaterally.nonlabored.  Good air movement  GI: Soft, nontender, non-distended; NABS. MS: No edema; No deformity.  She notes that her edema is notably improved.  She only took Lasix briefly. -->  Neuro:  Nonfocal    Labs    Chemistry Recent Labs  Lab 08/22/17 1437  NA 138  K 4.2  CL 101  CO2 29  GLUCOSE 233*  BUN 15  CREATININE 0.84  CALCIUM 9.1  GFRNONAA >60  GFRAA >60  ANIONGAP 8     Hematology Recent Labs  Lab 08/22/17 1437 08/28/17 0525  WBC 11.9* 10.9*  RBC 4.18 3.76*  HGB 11.9* 10.8*  HCT 37.4 33.8*  MCV 89.5 89.9  MCH 28.5 28.7  MCHC 31.8 32.0  RDW 14.3 14.2  PLT 413* 340    Cardiac EnzymesNo results for input(s): TROPONINI in the last 168 hours. No results for  input(s): TROPIPOC in the last 168 hours.   BNPNo results for input(s): BNP, PROBNP in the last 168 hours.   DDimer No results for input(s): DDIMER in the last 168 hours.   Radiology    No results found.  Cardiac Studies    Transthoracic Echo June 06, 2017: Mild to moderate reduced EF 40 and 45%.  Anterior septal, inferoseptal and basal to mid inferior hypokinesis.  GR 1 DD.  No significant valvular lesion  Cardiac Cath 08/27/2017: pLAD 95% - p-mLAD 50%, ostD1 90% -pD1 80%; ostOM1 90%; rPDA 80%. EF ~50-55% - HK of dital Anterolateral & Apical wall.   Recommendation - CVTS consult.   Patient Profile     67 y.o. female with a history of prio chest pain evaluation with a negative Myoview in Dec 2017. Her LVF was normal then-55-60% by Myoview. Other ,medical problems include DM, HTN, HLD, LBBB, Bipolar disorder, and recent diagnosis of Lt parietal CVA by MRI Jan 2019. Echo done Feb 2019 showed LVD-EF 40-45% with AS and inferior WMA.  She was then seen by Kerin Ransom, PA for symptoms concerning for progressive unstable angina.  She underwent cardiac catheterization on Aug 27, 2017 by Dr. Burt Knack revealing multivessel CAD.  Decision was to admit and consult CVTS. The CVTS PA has seen the patient and written a note, Dr. Prescott Gum has reviewed the films.  Tentatively plan for CABG on 08/29/2017.   Assessment & Plan    Principal Problem:   Unstable angina (HCC) Active Problems:   Coronary artery disease involving native coronary artery of native heart with unstable angina pectoris ([place])   Hyperlipidemia associated with type 2 diabetes mellitus ([place])   Obstructive sleep apnea   Essential hypertension   Insulin dependent diabetes mellitus ([place])   Morbid obesity (HCC)  Principal Problem:   Unstable angina (HCC) => Cath showing:  Coronary artery disease involving native coronary artery of native heart with unstable angina pectoris (HCC) (EF appears better than by Echo)  Placed on heparin drip along with aspirin post-cath. ->  On PPI for GI prophylaxis and GERD.  On ARB  Will add low-dose beta-blocker  Had been on Lasix at home, monitor for volume.  Appears to be euvolemic.  LVEDP was 17-23 yesterday.  Per discussion with Dr. Prescott Gum, he is considering CABG tomorrow (he intends to see the patient today)     Hyperlipidemia associated with type 2 diabetes mellitus ([place]) -we will add statin    Obstructive sleep apnea -> will order CPAP (usually her setting at home is 12)    Essential hypertension on ARB at home.  Continue current dose and add beta-blocker -     Insulin dependent diabetes mellitus ([place]) currently on standing Lantus with sliding scale -.  May need meal coverage after initial assessment.    Morbid obesity ([place]) -we will make recovery from surgery difficult, will need diet and exercise counseling.  She is on Effexor for depression and trazodone for sleep as well as Requip for restless leg.  She is also on Neurontin for neuropathic  pain.   Await discussion with Dr. Prescott Gum to determine candidacy for CABG but with tentative plan for tomorrow.   For questions or updates, please contact [place] Please consult [email or web address] for contact info under Cardiology/STEMI.      Signed, Glenetta Hew, MD  08/28/2017, 10:08 AM

## 2017-08-28 NOTE — Anesthesia Preprocedure Evaluation (Addendum)
Anesthesia Evaluation  Patient identified by MRN, date of birth, ID band Patient awake    Reviewed: Allergy & Precautions, NPO status , Patient's Chart, lab work & pertinent test results  Airway Mallampati: I  TM Distance: >3 FB Neck ROM: Full    Dental  (+) Partial Upper, Dental Advisory Given   Pulmonary sleep apnea and Continuous Positive Airway Pressure Ventilation , COPD, former smoker,     + decreased breath sounds      Cardiovascular hypertension, Pt. on medications + CAD  + dysrhythmias  Rhythm:Regular Rate:Normal     Neuro/Psych  Headaches, PSYCHIATRIC DISORDERS Anxiety Depression Bipolar Disorder  Neuromuscular disease CVA    GI/Hepatic Neg liver ROS, hiatal hernia, GERD  Medicated,  Endo/Other  diabetes, Type 2, Insulin Dependent, Oral Hypoglycemic AgentsHypothyroidism   Renal/GU negative Renal ROS     Musculoskeletal  (+) Arthritis ,   Abdominal (+) + obese,   Peds  Hematology  (+) anemia ,   Anesthesia Other Findings   Reproductive/Obstetrics                            Lab Results  Component Value Date   WBC 10.9 (H) 08/28/2017   HGB 10.8 (L) 08/28/2017   HCT 33.8 (L) 08/28/2017   MCV 89.9 08/28/2017   PLT 340 08/28/2017   Lab Results  Component Value Date   CREATININE 0.86 08/28/2017   BUN 7 08/28/2017   NA 140 08/28/2017   K 3.9 08/28/2017   CL 103 08/28/2017   CO2 28 08/28/2017   Lab Results  Component Value Date   INR 1.05 08/28/2017   INR 0.90 08/22/2017   EKG: Sinus tachycardia, LBBB  Echo: - Left ventricle: The cavity size was normal. There was mild   concentric hypertrophy. Systolic function was mildly to   moderately reduced. The estimated ejection fraction was in the   range of 40% to 45%. Hypokinesis of the anteroseptal,   inferoseptal and basal to mid inferior myocardium. Doppler   parameters are consistent with abnormal left ventricular  relaxation (grade 1 diastolic dysfunction). Doppler parameters   are consistent with indeterminate ventricular filling pressure. - Aortic valve: Transvalvular velocity was within the normal range.   There was no stenosis. There was no regurgitation. - Mitral valve: Transvalvular velocity was within the normal range.   There was no evidence for stenosis. There was no regurgitation. - Right ventricle: The cavity size was normal. Wall thickness was   normal. Systolic function was normal. - Tricuspid valve: There was trivial regurgitation.  Anesthesia Physical Anesthesia Plan  ASA: IV  Anesthesia Plan: General   Post-op Pain Management:    Induction: Intravenous  PONV Risk Score and Plan: 4 or greater and Treatment may vary due to age or medical condition  Airway Management Planned: Oral ETT  Additional Equipment: Arterial line, CVP, TEE and PA Cath  Intra-op Plan:   Post-operative Plan: Post-operative intubation/ventilation  Informed Consent: I have reviewed the patients History and Physical, chart, labs and discussed the procedure including the risks, benefits and alternatives for the proposed anesthesia with the patient or authorized representative who has indicated his/her understanding and acceptance.   Dental advisory given  Plan Discussed with: CRNA  Anesthesia Plan Comments:        Anesthesia Quick Evaluation

## 2017-08-28 NOTE — Progress Notes (Signed)
Responded to [facility] to assist patient with completing AD.  AD completed and notarized.  Provide guidance, listening  emotional.and spiritual support.  Patient indicated that she was  Going to surgery tomorrow. Patient alert ,sitting on side of bed. Will follow as needed.      Jaclynn Major, [place], [facility], Pager [phone]

## 2017-08-29 ENCOUNTER — Inpatient Hospital Stay (HOSPITAL_COMMUNITY): Payer: PPO | Admitting: Anesthesiology

## 2017-08-29 ENCOUNTER — Inpatient Hospital Stay (HOSPITAL_COMMUNITY): Payer: PPO

## 2017-08-29 ENCOUNTER — Encounter (HOSPITAL_COMMUNITY)
Admission: RE | Disposition: A | Discharge status: Skilled Nursing Facility | Payer: Self-pay | Source: Ambulatory Visit | Attending: Cardiothoracic Surgery

## 2017-08-29 DIAGNOSIS — I2510 Atherosclerotic heart disease of native coronary artery without angina pectoris: Secondary | ICD-10-CM

## 2017-08-29 DIAGNOSIS — Z951 Presence of aortocoronary bypass graft: Secondary | ICD-10-CM

## 2017-08-29 HISTORY — PX: CORONARY ARTERY BYPASS GRAFT: SHX141

## 2017-08-29 HISTORY — PX: TEE WITHOUT CARDIOVERSION: SHX5443

## 2017-08-29 LAB — POCT I-STAT 3, ART BLOOD GAS (G3+)
ACID-BASE DEFICIT: 1 mmol/L (ref 0.0–2.0)
ACID-BASE DEFICIT: 3 mmol/L — AB (ref 0.0–2.0)
Acid-base deficit: 2 mmol/L (ref 0.0–2.0)
Acid-base deficit: 4 mmol/L — ABNORMAL HIGH (ref 0.0–2.0)
BICARBONATE: 23.6 mmol/L (ref 20.0–28.0)
BICARBONATE: 22.1 mmol/L (ref 20.0–28.0)
Bicarbonate: 25.3 mmol/L (ref 20.0–28.0)
Bicarbonate: 26 mmol/L (ref 20.0–28.0)
Bicarbonate: 23.3 mmol/L (ref 20.0–28.0)
O2 SAT: 89 %
O2 SAT: 92 %
O2 SAT: 95 %
O2 Saturation: 100 %
O2 Saturation: 99 %
PCO2 ART: 43.4 mmHg (ref 32.0–48.0)
PCO2 ART: 41.5 mmHg (ref 32.0–48.0)
PH ART: 7.355 (ref 7.350–7.450)
PH ART: 7.318 — AB (ref 7.350–7.450)
PH ART: 7.31 — AB (ref 7.350–7.450)
PO2 ART: 154 mmHg — AB (ref 83.0–108.0)
PO2 ART: 82 mmHg — AB (ref 83.0–108.0)
Patient temperature: 36.9
TCO2: 27 mmol/L (ref 22–32)
TCO2: 25 mmol/L (ref 22–32)
TCO2: 28 mmol/L (ref 22–32)
TCO2: 25 mmol/L (ref 22–32)
TCO2: 23 mmol/L (ref 22–32)
pCO2 arterial: 45.2 mmHg (ref 32.0–48.0)
pCO2 arterial: 49.8 mmHg — ABNORMAL HIGH (ref 32.0–48.0)
pCO2 arterial: 45.7 mmHg (ref 32.0–48.0)
pH, Arterial: 7.343 — ABNORMAL LOW (ref 7.350–7.450)
pH, Arterial: 7.334 — ABNORMAL LOW (ref 7.350–7.450)
pO2, Arterial: 294 mmHg — ABNORMAL HIGH (ref 83.0–108.0)
pO2, Arterial: 58 mmHg — ABNORMAL LOW (ref 83.0–108.0)
pO2, Arterial: 67 mmHg — ABNORMAL LOW (ref 83.0–108.0)

## 2017-08-29 LAB — POCT I-STAT, CHEM 8
BUN: 13 mg/dL (ref 6–20)
BUN: 12 mg/dL (ref 6–20)
BUN: 13 mg/dL (ref 6–20)
BUN: 12 mg/dL (ref 6–20)
BUN: 14 mg/dL (ref 6–20)
BUN: 13 mg/dL (ref 6–20)
BUN: 12 mg/dL (ref 6–20)
CALCIUM ION: 1.23 mmol/L (ref 1.15–1.40)
CALCIUM ION: 1.02 mmol/L — AB (ref 1.15–1.40)
CALCIUM ION: 1.14 mmol/L — AB (ref 1.15–1.40)
CALCIUM ION: 1.09 mmol/L — AB (ref 1.15–1.40)
CHLORIDE: 102 mmol/L (ref 101–111)
CHLORIDE: 106 mmol/L (ref 101–111)
CHLORIDE: 106 mmol/L (ref 101–111)
CREATININE: 0.8 mg/dL (ref 0.44–1.00)
CREATININE: 0.8 mg/dL (ref 0.44–1.00)
CREATININE: 0.6 mg/dL (ref 0.44–1.00)
Calcium, Ion: 1.03 mmol/L — ABNORMAL LOW (ref 1.15–1.40)
Calcium, Ion: 1.31 mmol/L (ref 1.15–1.40)
Calcium, Ion: 1.22 mmol/L (ref 1.15–1.40)
Chloride: 105 mmol/L (ref 101–111)
Chloride: 106 mmol/L (ref 101–111)
Chloride: 104 mmol/L (ref 101–111)
Chloride: 104 mmol/L (ref 101–111)
Creatinine, Ser: 0.6 mg/dL (ref 0.44–1.00)
Creatinine, Ser: 0.6 mg/dL (ref 0.44–1.00)
Creatinine, Ser: 0.7 mg/dL (ref 0.44–1.00)
Creatinine, Ser: 0.7 mg/dL (ref 0.44–1.00)
GLUCOSE: 115 mg/dL — AB (ref 65–99)
GLUCOSE: 144 mg/dL — AB (ref 65–99)
GLUCOSE: 152 mg/dL — AB (ref 65–99)
Glucose, Bld: 144 mg/dL — ABNORMAL HIGH (ref 65–99)
Glucose, Bld: 153 mg/dL — ABNORMAL HIGH (ref 65–99)
Glucose, Bld: 154 mg/dL — ABNORMAL HIGH (ref 65–99)
Glucose, Bld: 148 mg/dL — ABNORMAL HIGH (ref 65–99)
HCT: 30 % — ABNORMAL LOW (ref 36.0–46.0)
HCT: 25 % — ABNORMAL LOW (ref 36.0–46.0)
HCT: 25 % — ABNORMAL LOW (ref 36.0–46.0)
HEMATOCRIT: 21 % — AB (ref 36.0–46.0)
HEMATOCRIT: 25 % — AB (ref 36.0–46.0)
HEMATOCRIT: 27 % — AB (ref 36.0–46.0)
HEMATOCRIT: 30 % — AB (ref 36.0–46.0)
HEMOGLOBIN: 10.2 g/dL — AB (ref 12.0–15.0)
HEMOGLOBIN: 8.5 g/dL — AB (ref 12.0–15.0)
HEMOGLOBIN: 8.5 g/dL — AB (ref 12.0–15.0)
HEMOGLOBIN: 8.5 g/dL — AB (ref 12.0–15.0)
HEMOGLOBIN: 10.2 g/dL — AB (ref 12.0–15.0)
Hemoglobin: 7.1 g/dL — ABNORMAL LOW (ref 12.0–15.0)
Hemoglobin: 9.2 g/dL — ABNORMAL LOW (ref 12.0–15.0)
POTASSIUM: 4.2 mmol/L (ref 3.5–5.1)
POTASSIUM: 4.9 mmol/L (ref 3.5–5.1)
POTASSIUM: 4.4 mmol/L (ref 3.5–5.1)
Potassium: 3.9 mmol/L (ref 3.5–5.1)
Potassium: 4.2 mmol/L (ref 3.5–5.1)
Potassium: 4.1 mmol/L (ref 3.5–5.1)
Potassium: 4.2 mmol/L (ref 3.5–5.1)
SODIUM: 141 mmol/L (ref 135–145)
SODIUM: 139 mmol/L (ref 135–145)
SODIUM: 140 mmol/L (ref 135–145)
SODIUM: 142 mmol/L (ref 135–145)
Sodium: 142 mmol/L (ref 135–145)
Sodium: 140 mmol/L (ref 135–145)
Sodium: 141 mmol/L (ref 135–145)
TCO2: 26 mmol/L (ref 22–32)
TCO2: 24 mmol/L (ref 22–32)
TCO2: 26 mmol/L (ref 22–32)
TCO2: 24 mmol/L (ref 22–32)
TCO2: 25 mmol/L (ref 22–32)
TCO2: 24 mmol/L (ref 22–32)
TCO2: 23 mmol/L (ref 22–32)

## 2017-08-29 LAB — GLUCOSE, CAPILLARY
GLUCOSE-CAPILLARY: 151 mg/dL — AB (ref 65–99)
GLUCOSE-CAPILLARY: 118 mg/dL — AB (ref 65–99)
Glucose-Capillary: 122 mg/dL — ABNORMAL HIGH (ref 65–99)
Glucose-Capillary: 122 mg/dL — ABNORMAL HIGH (ref 65–99)
Glucose-Capillary: 163 mg/dL — ABNORMAL HIGH (ref 65–99)
Glucose-Capillary: 151 mg/dL — ABNORMAL HIGH (ref 65–99)
Glucose-Capillary: 135 mg/dL — ABNORMAL HIGH (ref 65–99)
Glucose-Capillary: 129 mg/dL — ABNORMAL HIGH (ref 65–99)
Glucose-Capillary: 102 mg/dL — ABNORMAL HIGH (ref 65–99)

## 2017-08-29 LAB — BASIC METABOLIC PANEL
Anion gap: 10 (ref 5–15)
BUN: 11 mg/dL (ref 6–20)
CO2: 25 mmol/L (ref 22–32)
Calcium: 8.9 mg/dL (ref 8.9–10.3)
Chloride: 106 mmol/L (ref 101–111)
Creatinine, Ser: 1.01 mg/dL — ABNORMAL HIGH (ref 0.44–1.00)
GFR calc Af Amer: >60 mL/min (ref >60)
GFR calc non Af Amer: 58 mL/min — ABNORMAL LOW (ref >60)
Glucose, Bld: 128 mg/dL — ABNORMAL HIGH (ref 65–99)
Potassium: 3.7 mmol/L (ref 3.5–5.1)
Sodium: 141 mmol/L (ref 135–145)

## 2017-08-29 LAB — CBC
HCT: 34.1 % — ABNORMAL LOW (ref 36.0–46.0)
HCT: 31.9 % — ABNORMAL LOW (ref 36.0–46.0)
HEMATOCRIT: 32.3 % — AB (ref 36.0–46.0)
HEMOGLOBIN: 10.7 g/dL — AB (ref 12.0–15.0)
HEMOGLOBIN: 10.5 g/dL — AB (ref 12.0–15.0)
Hemoglobin: 10.4 g/dL — ABNORMAL LOW (ref 12.0–15.0)
MCH: 28 pg (ref 26.0–34.0)
MCH: 28.5 pg (ref 26.0–34.0)
MCH: 28.6 pg (ref 26.0–34.0)
MCHC: 31.4 g/dL (ref 30.0–36.0)
MCHC: 32.5 g/dL (ref 30.0–36.0)
MCHC: 32.6 g/dL (ref 30.0–36.0)
MCV: 89.3 fL (ref 78.0–100.0)
MCV: 87.5 fL (ref 78.0–100.0)
MCV: 87.6 fL (ref 78.0–100.0)
Platelets: 316 10*3/uL (ref 150–400)
Platelets: 218 10*3/uL (ref 150–400)
Platelets: 238 10*3/uL (ref 150–400)
RBC: 3.82 MIL/uL — ABNORMAL LOW (ref 3.87–5.11)
RBC: 3.69 MIL/uL — AB (ref 3.87–5.11)
RBC: 3.64 MIL/uL — ABNORMAL LOW (ref 3.87–5.11)
RDW: 14.4 % (ref 11.5–15.5)
RDW: 14.7 % (ref 11.5–15.5)
RDW: 14.9 % (ref 11.5–15.5)
WBC: 10.8 10*3/uL — ABNORMAL HIGH (ref 4.0–10.5)
WBC: 27.2 10*3/uL — ABNORMAL HIGH (ref 4.0–10.5)
WBC: 26.6 10*3/uL — ABNORMAL HIGH (ref 4.0–10.5)

## 2017-08-29 LAB — POCT I-STAT 4, (NA,K, GLUC, HGB,HCT)
GLUCOSE: 161 mg/dL — AB (ref 65–99)
HEMATOCRIT: 33 % — AB (ref 36.0–46.0)
HEMOGLOBIN: 11.2 g/dL — AB (ref 12.0–15.0)
POTASSIUM: 3.8 mmol/L (ref 3.5–5.1)
Sodium: 143 mmol/L (ref 135–145)

## 2017-08-29 LAB — SURGICAL PCR SCREEN
MRSA, PCR: NEGATIVE
Staphylococcus aureus: POSITIVE — AB

## 2017-08-29 LAB — URINALYSIS, ROUTINE W REFLEX MICROSCOPIC
Bilirubin Urine: NEGATIVE
Glucose, UA: NEGATIVE mg/dL
Hgb urine dipstick: NEGATIVE
Ketones, ur: NEGATIVE mg/dL
Leukocytes, UA: NEGATIVE
Nitrite: NEGATIVE
Protein, ur: NEGATIVE mg/dL
Specific Gravity, Urine: 1.008 (ref 1.005–1.030)
pH: 5 (ref 5.0–8.0)

## 2017-08-29 LAB — PROTIME-INR
INR: 1.23
Prothrombin Time: 15.4 seconds — ABNORMAL HIGH (ref 11.4–15.2)

## 2017-08-29 LAB — APTT: aPTT: 27 seconds (ref 24–36)

## 2017-08-29 LAB — HEMOGLOBIN AND HEMATOCRIT, BLOOD
HCT: 24 % — ABNORMAL LOW (ref 36.0–46.0)
Hemoglobin: 7.7 g/dL — ABNORMAL LOW (ref 12.0–15.0)

## 2017-08-29 LAB — CREATININE, SERUM
Creatinine, Ser: 0.88 mg/dL (ref 0.44–1.00)
GFR calc Af Amer: >60 mL/min (ref >60)
GFR calc non Af Amer: >60 mL/min (ref >60)

## 2017-08-29 LAB — MAGNESIUM: Magnesium: 2.6 mg/dL — ABNORMAL HIGH (ref 1.7–2.4)

## 2017-08-29 LAB — PREPARE RBC (CROSSMATCH)

## 2017-08-29 LAB — PLATELET COUNT: Platelets: 239 10*3/uL (ref 150–400)

## 2017-08-29 SURGERY — CORONARY ARTERY BYPASS GRAFTING (CABG)
Anesthesia: General | Site: Chest

## 2017-08-29 MED ORDER — ONDANSETRON HCL 4 MG/2ML IJ SOLN
4.0000 mg | Freq: Four times a day (QID) | INTRAMUSCULAR | Status: DC | PRN
  Administered 2017-08-30 – 2017-09-02 (×3): 4 mg via INTRAVENOUS
  Filled 2017-08-29 (×6): qty 2

## 2017-08-29 MED ORDER — SODIUM CHLORIDE 0.9 % IV SOLN
INTRAVENOUS | Status: DC
  Administered 2017-08-29: 5.5 [IU]/h via INTRAVENOUS
  Filled 2017-08-29: qty 1

## 2017-08-29 MED ORDER — NITROGLYCERIN IN D5W 200-5 MCG/ML-% IV SOLN: 0.0000 ug/min | INTRAVENOUS | Status: DC

## 2017-08-29 MED ORDER — LACTATED RINGERS IV SOLN: 500.0000 mL | Freq: Once | INTRAVENOUS | Status: DC | PRN

## 2017-08-29 MED ORDER — POTASSIUM CHLORIDE 2 MEQ/ML IV SOLN
80.0000 meq | INTRAVENOUS | Status: DC
  Filled 2017-08-29: qty 40

## 2017-08-29 MED ORDER — MAGNESIUM SULFATE 4 GM/100ML IV SOLN
4.0000 g | Freq: Once | INTRAVENOUS | Status: AC
  Administered 2017-08-29: 4 g via INTRAVENOUS
  Filled 2017-08-29: qty 100

## 2017-08-29 MED ORDER — BACLOFEN 10 MG PO TABS
10.0000 mg | ORAL_TABLET | Freq: Every day | ORAL | Status: DC
  Administered 2017-08-30: 10 mg via ORAL
  Filled 2017-08-29: qty 1

## 2017-08-29 MED ORDER — METOPROLOL TARTRATE 12.5 MG HALF TABLET
12.5000 mg | ORAL_TABLET | Freq: Two times a day (BID) | ORAL | Status: DC
  Administered 2017-08-30 – 2017-09-05 (×12): 12.5 mg via ORAL
  Filled 2017-08-29 (×12): qty 1

## 2017-08-29 MED ORDER — SODIUM CHLORIDE 0.9 % IV SOLN: INTRAVENOUS | Status: DC

## 2017-08-29 MED ORDER — ROCURONIUM BROMIDE 50 MG/5ML IV SOLN
INTRAVENOUS | Status: AC
  Filled 2017-08-29: qty 2

## 2017-08-29 MED ORDER — METOPROLOL TARTRATE 25 MG/10 ML ORAL SUSPENSION
12.5000 mg | Freq: Two times a day (BID) | ORAL | Status: DC
  Filled 2017-08-29 (×6): qty 5

## 2017-08-29 MED ORDER — HEMOSTATIC AGENTS (NO CHARGE) OPTIME
TOPICAL | Status: DC | PRN
  Administered 2017-08-29: 1 via TOPICAL

## 2017-08-29 MED ORDER — FENTANYL CITRATE (PF) 250 MCG/5ML IJ SOLN
INTRAMUSCULAR | Status: AC
Start: 2017-08-29 — End: ?
  Filled 2017-08-29: qty 30

## 2017-08-29 MED ORDER — SODIUM CHLORIDE 0.9% FLUSH
3.0000 mL | Freq: Two times a day (BID) | INTRAVENOUS | Status: DC
  Administered 2017-08-30 – 2017-09-04 (×6): 3 mL via INTRAVENOUS

## 2017-08-29 MED ORDER — SODIUM CHLORIDE 0.9 % IV SOLN
Freq: Once | INTRAVENOUS | Status: AC
  Administered 2017-08-29: 09:00:00 via INTRAVENOUS

## 2017-08-29 MED ORDER — VANCOMYCIN HCL IN DEXTROSE 1-5 GM/200ML-% IV SOLN
1000.0000 mg | Freq: Once | INTRAVENOUS | Status: AC
  Administered 2017-08-29: 1000 mg via INTRAVENOUS
  Filled 2017-08-29: qty 200

## 2017-08-29 MED ORDER — METOPROLOL TARTRATE 5 MG/5ML IV SOLN
2.5000 mg | INTRAVENOUS | Status: DC | PRN
  Administered 2017-08-31: 5 mg via INTRAVENOUS
  Filled 2017-08-29 (×3): qty 5

## 2017-08-29 MED ORDER — PLASMA-LYTE 148 IV SOLN
INTRAVENOUS | Status: AC | PRN
  Administered 2017-08-29: 500 mL via INTRAVENOUS

## 2017-08-29 MED ORDER — SODIUM CHLORIDE 0.9 % IV SOLN
30.0000 ug/min | INTRAVENOUS | Status: DC
  Filled 2017-08-29: qty 2

## 2017-08-29 MED ORDER — ASPIRIN 81 MG PO CHEW
324.0000 mg | CHEWABLE_TABLET | Freq: Every day | ORAL | Status: DC
  Filled 2017-08-29: qty 4

## 2017-08-29 MED ORDER — TRANEXAMIC ACID (OHS) PUMP PRIME SOLUTION
2.0000 mg/kg | INTRAVENOUS | Status: DC
Start: 2017-08-29 — End: 2017-08-29
  Filled 2017-08-29: qty 2.34

## 2017-08-29 MED ORDER — HEMOSTATIC AGENTS (NO CHARGE) OPTIME
TOPICAL | Status: DC | PRN
  Administered 2017-08-29 (×3): 1 via TOPICAL

## 2017-08-29 MED ORDER — DIPHENHYDRAMINE HCL 50 MG/ML IJ SOLN
INTRAMUSCULAR | Status: AC
  Filled 2017-08-29: qty 1

## 2017-08-29 MED ORDER — PROTAMINE SULFATE 10 MG/ML IV SOLN
INTRAVENOUS | Status: AC
  Filled 2017-08-29: qty 25

## 2017-08-29 MED ORDER — BUPROPION HCL ER (XL) 300 MG PO TB24
300.0000 mg | ORAL_TABLET | Freq: Every morning | ORAL | Status: DC
  Administered 2017-09-01 – 2017-09-05 (×5): 300 mg via ORAL
  Filled 2017-08-29 (×7): qty 1

## 2017-08-29 MED ORDER — DEXMEDETOMIDINE HCL IN NACL 200 MCG/50ML IV SOLN
0.0000 ug/kg/h | INTRAVENOUS | Status: DC
  Administered 2017-08-29 – 2017-08-30 (×9): 0.7 ug/kg/h via INTRAVENOUS
  Filled 2017-08-29 (×5): qty 50
  Filled 2017-08-29: qty 100
  Filled 2017-08-29: qty 50

## 2017-08-29 MED ORDER — SODIUM CHLORIDE 0.9 % IV SOLN
250.0000 mL | INTRAVENOUS | Status: DC
  Administered 2017-08-30: 250 mL via INTRAVENOUS

## 2017-08-29 MED ORDER — PROTAMINE SULFATE 10 MG/ML IV SOLN
INTRAVENOUS | Status: DC | PRN
  Administered 2017-08-29: 320 mg via INTRAVENOUS

## 2017-08-29 MED ORDER — ARTIFICIAL TEARS OPHTHALMIC OINT
TOPICAL_OINTMENT | OPHTHALMIC | Status: DC | PRN
  Administered 2017-08-29: 1 via OPHTHALMIC

## 2017-08-29 MED ORDER — DEXAMETHASONE SODIUM PHOSPHATE 10 MG/ML IJ SOLN
INTRAMUSCULAR | Status: AC
  Filled 2017-08-29: qty 1

## 2017-08-29 MED ORDER — MILRINONE LACTATE IN DEXTROSE 20-5 MG/100ML-% IV SOLN
0.1250 ug/kg/min | INTRAVENOUS | Status: AC
  Administered 2017-08-29: .25 ug/kg/min via INTRAVENOUS
  Filled 2017-08-29: qty 100

## 2017-08-29 MED ORDER — FAMOTIDINE IN NACL 20-0.9 MG/50ML-% IV SOLN
20.0000 mg | Freq: Two times a day (BID) | INTRAVENOUS | Status: AC
  Administered 2017-08-29 (×2): 20 mg via INTRAVENOUS
  Filled 2017-08-29: qty 50

## 2017-08-29 MED ORDER — HEPARIN SODIUM (PORCINE) 1000 UNIT/ML IJ SOLN
INTRAMUSCULAR | Status: DC | PRN
  Administered 2017-08-29: 35000 [IU] via INTRAVENOUS

## 2017-08-29 MED ORDER — ACETAMINOPHEN 160 MG/5ML PO SOLN: 650.0000 mg | Freq: Once | ORAL | Status: AC

## 2017-08-29 MED ORDER — LEVOFLOXACIN IN D5W 750 MG/150ML IV SOLN
750.0000 mg | INTRAVENOUS | Status: AC
  Administered 2017-08-30 – 2017-08-31 (×2): 750 mg via INTRAVENOUS
  Filled 2017-08-29 (×2): qty 150

## 2017-08-29 MED ORDER — LEVOFLOXACIN IN D5W 750 MG/150ML IV SOLN: 750.0000 mg | INTRAVENOUS | Status: DC

## 2017-08-29 MED ORDER — ORAL CARE MOUTH RINSE
15.0000 mL | Freq: Four times a day (QID) | OROMUCOSAL | Status: DC
  Administered 2017-08-29 – 2017-09-01 (×8): 15 mL via OROMUCOSAL

## 2017-08-29 MED ORDER — LEVOTHYROXINE SODIUM 100 MCG PO TABS
100.0000 ug | ORAL_TABLET | Freq: Every day | ORAL | Status: DC
  Administered 2017-08-30 – 2017-09-01 (×3): 100 ug via ORAL
  Filled 2017-08-29 (×3): qty 1

## 2017-08-29 MED ORDER — HEMOSTATIC AGENTS (NO CHARGE) OPTIME
TOPICAL | Status: DC | PRN
  Administered 2017-08-29 (×2): 1 via TOPICAL

## 2017-08-29 MED ORDER — LACTATED RINGERS IV SOLN
INTRAVENOUS | Status: DC
  Administered 2017-08-31: 20 mL/h via INTRAVENOUS

## 2017-08-29 MED ORDER — ASPIRIN EC 325 MG PO TBEC
325.0000 mg | DELAYED_RELEASE_TABLET | Freq: Every day | ORAL | Status: DC
  Administered 2017-08-31 – 2017-09-05 (×6): 325 mg via ORAL
  Filled 2017-08-29 (×7): qty 1

## 2017-08-29 MED ORDER — TRANEXAMIC ACID 1000 MG/10ML IV SOLN
1.5000 mg/kg/h | INTRAVENOUS | Status: DC
  Filled 2017-08-29: qty 25

## 2017-08-29 MED ORDER — LEVALBUTEROL HCL 1.25 MG/0.5ML IN NEBU
1.2500 mg | INHALATION_SOLUTION | Freq: Four times a day (QID) | RESPIRATORY_TRACT | Status: DC
  Administered 2017-08-29 – 2017-09-02 (×15): 1.25 mg via RESPIRATORY_TRACT
  Filled 2017-08-29 (×15): qty 0.5

## 2017-08-29 MED ORDER — LIDOCAINE 2% (20 MG/ML) 5 ML SYRINGE
INTRAMUSCULAR | Status: AC
  Filled 2017-08-29: qty 5

## 2017-08-29 MED ORDER — METOCLOPRAMIDE HCL 5 MG/ML IJ SOLN
10.0000 mg | Freq: Four times a day (QID) | INTRAMUSCULAR | Status: DC
  Administered 2017-08-29 – 2017-08-30 (×3): 10 mg via INTRAVENOUS
  Filled 2017-08-29 (×3): qty 2

## 2017-08-29 MED ORDER — PROTAMINE SULFATE 10 MG/ML IV SOLN
INTRAVENOUS | Status: AC
  Filled 2017-08-29: qty 10

## 2017-08-29 MED ORDER — ACETAMINOPHEN 650 MG RE SUPP
650.0000 mg | Freq: Once | RECTAL | Status: AC
  Administered 2017-08-29: 650 mg via RECTAL

## 2017-08-29 MED ORDER — ALBUMIN HUMAN 5 % IV SOLN
12.5000 g | Freq: Four times a day (QID) | INTRAVENOUS | Status: AC
  Administered 2017-08-29 – 2017-08-30 (×6): 12.5 g via INTRAVENOUS
  Filled 2017-08-29 (×4): qty 250

## 2017-08-29 MED ORDER — VENLAFAXINE HCL ER 150 MG PO CP24
300.0000 mg | ORAL_CAPSULE | Freq: Every day | ORAL | Status: DC
  Administered 2017-08-30 – 2017-09-04 (×6): 300 mg via ORAL
  Filled 2017-08-29 (×7): qty 2

## 2017-08-29 MED ORDER — PHENYLEPHRINE HCL 10 MG/ML IJ SOLN
INTRAVENOUS | Status: DC | PRN
  Administered 2017-08-29: 25 ug/min via INTRAVENOUS
  Administered 2017-08-29: 50 ug/min via INTRAVENOUS

## 2017-08-29 MED ORDER — SODIUM CHLORIDE 0.9% FLUSH: 10.0000 mL | INTRAVENOUS | Status: DC | PRN

## 2017-08-29 MED ORDER — CHLORHEXIDINE GLUCONATE 0.12 % MT SOLN
15.0000 mL | OROMUCOSAL | Status: AC
  Administered 2017-08-29: 15 mL via OROMUCOSAL

## 2017-08-29 MED ORDER — ARTIFICIAL TEARS OPHTHALMIC OINT
TOPICAL_OINTMENT | OPHTHALMIC | Status: AC
  Filled 2017-08-29: qty 3.5

## 2017-08-29 MED ORDER — HEPARIN SODIUM (PORCINE) 1000 UNIT/ML IJ SOLN
INTRAMUSCULAR | Status: AC
  Filled 2017-08-29: qty 1

## 2017-08-29 MED ORDER — DEXAMETHASONE SODIUM PHOSPHATE 10 MG/ML IJ SOLN
INTRAMUSCULAR | Status: DC | PRN
  Administered 2017-08-29: 10 mg via INTRAVENOUS

## 2017-08-29 MED ORDER — DOCUSATE SODIUM 100 MG PO CAPS
200.0000 mg | ORAL_CAPSULE | Freq: Every day | ORAL | Status: DC
  Administered 2017-08-31 – 2017-09-04 (×3): 200 mg via ORAL
  Filled 2017-08-29 (×5): qty 2

## 2017-08-29 MED ORDER — SUCCINYLCHOLINE CHLORIDE 200 MG/10ML IV SOSY
PREFILLED_SYRINGE | INTRAVENOUS | Status: AC
  Filled 2017-08-29: qty 10

## 2017-08-29 MED ORDER — VASOPRESSIN 20 UNIT/ML IV SOLN
INTRAVENOUS | Status: AC
  Filled 2017-08-29: qty 1

## 2017-08-29 MED ORDER — NITROGLYCERIN IN D5W 200-5 MCG/ML-% IV SOLN
2.0000 ug/min | INTRAVENOUS | Status: AC
  Administered 2017-08-29: 16.6 ug/min via INTRAVENOUS
  Filled 2017-08-29: qty 250

## 2017-08-29 MED ORDER — BISACODYL 5 MG PO TBEC
10.0000 mg | DELAYED_RELEASE_TABLET | Freq: Every day | ORAL | Status: DC
  Administered 2017-08-31 – 2017-09-01 (×2): 10 mg via ORAL
  Filled 2017-08-29 (×6): qty 2

## 2017-08-29 MED ORDER — TRANEXAMIC ACID (OHS) BOLUS VIA INFUSION
15.0000 mg/kg | INTRAVENOUS | Status: AC
  Administered 2017-08-29: 1758 mg via INTRAVENOUS
  Filled 2017-08-29: qty 1758

## 2017-08-29 MED ORDER — EPINEPHRINE PF 1 MG/ML IJ SOLN
0.0000 ug/min | INTRAVENOUS | Status: DC
  Filled 2017-08-29: qty 4

## 2017-08-29 MED ORDER — NOREPINEPHRINE BITARTRATE 1 MG/ML IV SOLN
0.0000 ug/min | INTRAVENOUS | Status: DC
  Administered 2017-08-29: 4 ug/min via INTRAVENOUS
  Filled 2017-08-29 (×2): qty 4

## 2017-08-29 MED ORDER — 0.9 % SODIUM CHLORIDE (POUR BTL) OPTIME
TOPICAL | Status: DC | PRN
  Administered 2017-08-29: 1000 mL

## 2017-08-29 MED ORDER — TRAZODONE HCL 50 MG PO TABS
50.0000 mg | ORAL_TABLET | Freq: Every day | ORAL | Status: DC
  Administered 2017-08-30: 50 mg via ORAL
  Filled 2017-08-29: qty 1

## 2017-08-29 MED ORDER — PROPOFOL 10 MG/ML IV BOLUS
INTRAVENOUS | Status: AC
  Filled 2017-08-29: qty 20

## 2017-08-29 MED ORDER — BISACODYL 10 MG RE SUPP: 10.0000 mg | Freq: Every day | RECTAL | Status: DC

## 2017-08-29 MED ORDER — ACETAMINOPHEN 500 MG PO TABS
1000.0000 mg | ORAL_TABLET | Freq: Four times a day (QID) | ORAL | Status: AC
  Administered 2017-08-30 – 2017-09-03 (×12): 1000 mg via ORAL
  Filled 2017-08-29 (×12): qty 2

## 2017-08-29 MED ORDER — PHENYLEPHRINE HCL 10 MG/ML IJ SOLN
INTRAMUSCULAR | Status: AC
  Filled 2017-08-29: qty 3

## 2017-08-29 MED ORDER — DIPHENHYDRAMINE HCL 50 MG/ML IJ SOLN
INTRAMUSCULAR | Status: DC | PRN
  Administered 2017-08-29: 25 mg via INTRAVENOUS

## 2017-08-29 MED ORDER — SODIUM CHLORIDE 0.9% FLUSH: 3.0000 mL | INTRAVENOUS | Status: DC | PRN

## 2017-08-29 MED ORDER — NOREPINEPHRINE BITARTRATE 1 MG/ML IV SOLN
0.0000 ug/min | INTRAVENOUS | Status: DC
  Administered 2017-08-29: 8 ug/min via INTRAVENOUS
  Filled 2017-08-29: qty 4

## 2017-08-29 MED ORDER — CHLORHEXIDINE GLUCONATE 0.12% ORAL RINSE (MEDLINE KIT)
15.0000 mL | Freq: Two times a day (BID) | OROMUCOSAL | Status: DC
  Administered 2017-08-29 – 2017-09-03 (×8): 15 mL via OROMUCOSAL

## 2017-08-29 MED ORDER — MILRINONE LACTATE IN DEXTROSE 20-5 MG/100ML-% IV SOLN
0.1250 ug/kg/min | INTRAVENOUS | Status: AC
  Administered 2017-08-29 – 2017-08-31 (×4): 0.25 ug/kg/min via INTRAVENOUS
  Filled 2017-08-29 (×4): qty 100

## 2017-08-29 MED ORDER — LAMOTRIGINE 100 MG PO TABS
150.0000 mg | ORAL_TABLET | Freq: Every day | ORAL | Status: DC
  Administered 2017-08-30 – 2017-09-04 (×6): 150 mg via ORAL
  Filled 2017-08-29 (×3): qty 1
  Filled 2017-08-29: qty 2
  Filled 2017-08-29: qty 1
  Filled 2017-08-29: qty 2

## 2017-08-29 MED ORDER — SODIUM CHLORIDE 0.9 % IV SOLN
INTRAVENOUS | Status: DC
  Filled 2017-08-29: qty 30

## 2017-08-29 MED ORDER — ALBUMIN HUMAN 5 % IV SOLN
INTRAVENOUS | Status: DC | PRN
  Administered 2017-08-29 (×4): via INTRAVENOUS

## 2017-08-29 MED ORDER — HEPARIN SODIUM (PORCINE) 1000 UNIT/ML IJ SOLN
INTRAMUSCULAR | Status: AC
  Filled 2017-08-29: qty 2

## 2017-08-29 MED ORDER — SODIUM CHLORIDE 0.9 % IV SOLN
0.0000 ug/min | INTRAVENOUS | Status: DC
  Administered 2017-08-29: 80 ug/min via INTRAVENOUS
  Administered 2017-08-29 – 2017-08-30 (×2): 60 ug/min via INTRAVENOUS
  Filled 2017-08-29: qty 2
  Filled 2017-08-29 (×2): qty 20
  Filled 2017-08-29: qty 2

## 2017-08-29 MED ORDER — VANCOMYCIN HCL 10 G IV SOLR
1500.0000 mg | INTRAVENOUS | Status: AC
  Administered 2017-08-29: 1500 mg via INTRAVENOUS
  Filled 2017-08-29: qty 1500

## 2017-08-29 MED ORDER — INSULIN REGULAR BOLUS VIA INFUSION
0.0000 [IU] | Freq: Three times a day (TID) | INTRAVENOUS | Status: DC
  Filled 2017-08-29: qty 10

## 2017-08-29 MED ORDER — SODIUM CHLORIDE 0.9 % IV SOLN
INTRAVENOUS | Status: AC
  Administered 2017-08-29: 1.1 [IU]/h via INTRAVENOUS
  Filled 2017-08-29: qty 1

## 2017-08-29 MED ORDER — PROPOFOL 10 MG/ML IV BOLUS
INTRAVENOUS | Status: DC | PRN
  Administered 2017-08-29: 100 mg via INTRAVENOUS

## 2017-08-29 MED ORDER — POTASSIUM CHLORIDE 10 MEQ/50ML IV SOLN
10.0000 meq | INTRAVENOUS | Status: AC
  Administered 2017-08-29 (×3): 10 meq via INTRAVENOUS

## 2017-08-29 MED ORDER — PHENYLEPHRINE 40 MCG/ML (10ML) SYRINGE FOR IV PUSH (FOR BLOOD PRESSURE SUPPORT)
PREFILLED_SYRINGE | INTRAVENOUS | Status: AC
  Filled 2017-08-29: qty 10

## 2017-08-29 MED ORDER — DOPAMINE-DEXTROSE 3.2-5 MG/ML-% IV SOLN: 2.5000 ug/kg/min | INTRAVENOUS | Status: DC

## 2017-08-29 MED ORDER — LACTATED RINGERS IV SOLN: INTRAVENOUS | Status: DC

## 2017-08-29 MED ORDER — FENTANYL CITRATE (PF) 100 MCG/2ML IJ SOLN
50.0000 ug | INTRAMUSCULAR | Status: DC | PRN
  Administered 2017-08-29 – 2017-08-30 (×7): 50 ug via INTRAVENOUS
  Filled 2017-08-29 (×9): qty 2

## 2017-08-29 MED ORDER — PLASMA-LYTE 148 IV SOLN
INTRAVENOUS | Status: DC
  Filled 2017-08-29: qty 2.5

## 2017-08-29 MED ORDER — FENTANYL CITRATE (PF) 250 MCG/5ML IJ SOLN
INTRAMUSCULAR | Status: DC | PRN
  Administered 2017-08-29: 150 ug via INTRAVENOUS
  Administered 2017-08-29: 100 ug via INTRAVENOUS
  Administered 2017-08-29: 200 ug via INTRAVENOUS
  Administered 2017-08-29: 250 ug via INTRAVENOUS
  Administered 2017-08-29: 50 ug via INTRAVENOUS

## 2017-08-29 MED ORDER — ACETAMINOPHEN 160 MG/5ML PO SOLN
1000.0000 mg | Freq: Four times a day (QID) | ORAL | Status: AC
  Administered 2017-08-29 – 2017-09-01 (×6): 1000 mg
  Filled 2017-08-29 (×5): qty 40.6

## 2017-08-29 MED ORDER — LEVALBUTEROL HCL 1.25 MG/0.5ML IN NEBU: 1.2500 mg | INHALATION_SOLUTION | Freq: Four times a day (QID) | RESPIRATORY_TRACT | Status: DC

## 2017-08-29 MED ORDER — SODIUM CHLORIDE 0.45 % IV SOLN: INTRAVENOUS | Status: DC | PRN

## 2017-08-29 MED ORDER — VASOPRESSIN 20 UNIT/ML IV SOLN
INTRAVENOUS | Status: DC | PRN
  Administered 2017-08-29: 2 [IU] via INTRAVENOUS
  Administered 2017-08-29: 1 [IU] via INTRAVENOUS
  Administered 2017-08-29: 2 [IU] via INTRAVENOUS
  Administered 2017-08-29: 1 [IU] via INTRAVENOUS
  Administered 2017-08-29 (×2): 2 [IU] via INTRAVENOUS

## 2017-08-29 MED ORDER — SODIUM CHLORIDE 0.9% FLUSH
10.0000 mL | Freq: Two times a day (BID) | INTRAVENOUS | Status: DC
  Administered 2017-08-29 – 2017-09-04 (×12): 10 mL

## 2017-08-29 MED ORDER — GABAPENTIN 300 MG PO CAPS
900.0000 mg | ORAL_CAPSULE | Freq: Two times a day (BID) | ORAL | Status: DC
  Administered 2017-08-30: 900 mg via ORAL
  Filled 2017-08-29: qty 3

## 2017-08-29 MED ORDER — MIDAZOLAM HCL 5 MG/5ML IJ SOLN
INTRAMUSCULAR | Status: DC | PRN
  Administered 2017-08-29: 2 mg via INTRAVENOUS
  Administered 2017-08-29: 3 mg via INTRAVENOUS
  Administered 2017-08-29: 1 mg via INTRAVENOUS
  Administered 2017-08-29 (×2): 2 mg via INTRAVENOUS

## 2017-08-29 MED ORDER — MIDAZOLAM HCL 2 MG/2ML IJ SOLN
2.0000 mg | INTRAMUSCULAR | Status: DC | PRN
  Administered 2017-08-30 (×2): 2 mg via INTRAVENOUS
  Filled 2017-08-29 (×2): qty 2

## 2017-08-29 MED ORDER — MAGNESIUM SULFATE 50 % IJ SOLN
40.0000 meq | INTRAMUSCULAR | Status: DC
  Filled 2017-08-29: qty 9.85

## 2017-08-29 MED ORDER — DOPAMINE-DEXTROSE 3.2-5 MG/ML-% IV SOLN
0.0000 ug/kg/min | INTRAVENOUS | Status: AC
  Administered 2017-08-29: 2.5 ug/kg/min via INTRAVENOUS
  Filled 2017-08-29: qty 250

## 2017-08-29 MED ORDER — MIDAZOLAM HCL 10 MG/2ML IJ SOLN
INTRAMUSCULAR | Status: AC
  Filled 2017-08-29: qty 2

## 2017-08-29 MED ORDER — ROCURONIUM BROMIDE 100 MG/10ML IV SOLN
INTRAVENOUS | Status: DC | PRN
  Administered 2017-08-29: 40 mg via INTRAVENOUS
  Administered 2017-08-29: 60 mg via INTRAVENOUS
  Administered 2017-08-29: 40 mg via INTRAVENOUS
  Administered 2017-08-29: 20 mg via INTRAVENOUS

## 2017-08-29 MED ORDER — CHLORHEXIDINE GLUCONATE CLOTH 2 % EX PADS
6.0000 | MEDICATED_PAD | Freq: Every day | CUTANEOUS | Status: DC
  Administered 2017-08-29 – 2017-09-03 (×6): 6 via TOPICAL

## 2017-08-29 MED ORDER — LEVOFLOXACIN IN D5W 500 MG/100ML IV SOLN
500.0000 mg | INTRAVENOUS | Status: AC
  Administered 2017-08-29: 500 mg via INTRAVENOUS
  Filled 2017-08-29: qty 100

## 2017-08-29 MED ORDER — LACTATED RINGERS IV SOLN
INTRAVENOUS | Status: DC | PRN
  Administered 2017-08-29 (×4): via INTRAVENOUS

## 2017-08-29 MED ORDER — TRAMADOL HCL 50 MG PO TABS
50.0000 mg | ORAL_TABLET | ORAL | Status: DC | PRN
  Administered 2017-09-04 (×3): 50 mg via ORAL
  Filled 2017-08-29 (×3): qty 1

## 2017-08-29 MED ORDER — PANTOPRAZOLE SODIUM 40 MG PO TBEC
40.0000 mg | DELAYED_RELEASE_TABLET | Freq: Every day | ORAL | Status: DC
  Administered 2017-08-31 – 2017-09-05 (×6): 40 mg via ORAL
  Filled 2017-08-29 (×6): qty 1

## 2017-08-29 MED ORDER — DEXMEDETOMIDINE HCL IN NACL 400 MCG/100ML IV SOLN
0.1000 ug/kg/h | INTRAVENOUS | Status: AC
  Administered 2017-08-29: 0.7 ug/kg/h via INTRAVENOUS
  Filled 2017-08-29: qty 100

## 2017-08-29 MED ORDER — TRANEXAMIC ACID 1000 MG/10ML IV SOLN
1.5000 mg/kg/h | INTRAVENOUS | Status: AC
  Administered 2017-08-29: 1.5 mg/kg/h via INTRAVENOUS
  Administered 2017-08-29: 13:00:00 via INTRAVENOUS
  Filled 2017-08-29: qty 25

## 2017-08-29 SURGICAL SUPPLY — 108 items
ADAPTER CARDIO PERF ANTE/RETRO (ADAPTER) ×4 IMPLANT
ADPR PRFSN 84XANTGRD RTRGD (ADAPTER) ×2
BAG DECANTER FOR FLEXI CONT (MISCELLANEOUS) ×4 IMPLANT
BANDAGE ACE 4X5 VEL STRL LF (GAUZE/BANDAGES/DRESSINGS) ×4 IMPLANT
BANDAGE ACE 6X5 VEL STRL LF (GAUZE/BANDAGES/DRESSINGS) ×4 IMPLANT
BASKET HEART  (ORDER IN 25'S) (MISCELLANEOUS) ×1
BASKET HEART (ORDER IN 25'S) (MISCELLANEOUS) ×1
BASKET HEART (ORDER IN 25S) (MISCELLANEOUS) ×2 IMPLANT
BLADE CLIPPER SURG (BLADE) IMPLANT
BLADE STERNUM SYSTEM 6 (BLADE) ×4 IMPLANT
BLADE SURG 11 STRL SS (BLADE) ×2 IMPLANT
BLADE SURG 12 STRL SS (BLADE) ×4 IMPLANT
BNDG GAUZE ELAST 4 BULKY (GAUZE/BANDAGES/DRESSINGS) ×4 IMPLANT
CANISTER SUCT 3000ML PPV (MISCELLANEOUS) ×4 IMPLANT
CANNULA GUNDRY RCSP 15FR (MISCELLANEOUS) ×4 IMPLANT
CATH CPB KIT VANTRIGT (MISCELLANEOUS) ×4 IMPLANT
CATH ROBINSON RED A/P 18FR (CATHETERS) ×12 IMPLANT
CATH THORACIC 36FR RT ANG (CATHETERS) ×4 IMPLANT
CLIP RETRACTION 3.0MM CORONARY (MISCELLANEOUS) ×2 IMPLANT
CLIP VESOCCLUDE SM WIDE 24/CT (CLIP) ×2 IMPLANT
CRADLE DONUT ADULT HEAD (MISCELLANEOUS) ×4 IMPLANT
DRAIN CHANNEL 32F RND 10.7 FF (WOUND CARE) ×4 IMPLANT
DRAPE CARDIOVASCULAR INCISE (DRAPES) ×4
DRAPE SLUSH/WARMER DISC (DRAPES) ×4 IMPLANT
DRAPE SRG 135X102X78XABS (DRAPES) ×2 IMPLANT
DRSG AQUACEL AG ADV 3.5X14 (GAUZE/BANDAGES/DRESSINGS) ×4 IMPLANT
ELECT BLADE 4.0 EZ CLEAN MEGAD (MISCELLANEOUS) ×4
ELECT BLADE 6.5 EXT (BLADE) ×4 IMPLANT
ELECT CAUTERY BLADE 6.4 (BLADE) ×4 IMPLANT
ELECT REM PT RETURN 9FT ADLT (ELECTROSURGICAL) ×8
ELECTRODE BLDE 4.0 EZ CLN MEGD (MISCELLANEOUS) ×2 IMPLANT
ELECTRODE REM PT RTRN 9FT ADLT (ELECTROSURGICAL) ×4 IMPLANT
FELT TEFLON 1X6 (MISCELLANEOUS) ×6 IMPLANT
FLOSEAL 5ML (HEMOSTASIS) ×2 IMPLANT
GAUZE SPONGE 4X4 12PLY STRL (GAUZE/BANDAGES/DRESSINGS) ×8 IMPLANT
GLOVE BIO SURGEON STRL SZ 6.5 (GLOVE) ×4 IMPLANT
GLOVE BIO SURGEON STRL SZ7.5 (GLOVE) ×12 IMPLANT
GLOVE BIO SURGEONS STRL SZ 6.5 (GLOVE) ×4
GLOVE BIOGEL PI IND STRL 6.5 (GLOVE) IMPLANT
GLOVE BIOGEL PI INDICATOR 6.5 (GLOVE) ×6
GLOVE SURG SS PI 6.0 STRL IVOR (GLOVE) ×10 IMPLANT
GOWN STRL REUS W/ TWL LRG LVL3 (GOWN DISPOSABLE) ×8 IMPLANT
GOWN STRL REUS W/TWL LRG LVL3 (GOWN DISPOSABLE) ×24
HEMOSTAT ARISTA ABSORB 3G PWDR (MISCELLANEOUS) ×6 IMPLANT
HEMOSTAT POWDER SURGIFOAM 1G (HEMOSTASIS) ×6 IMPLANT
HEMOSTAT SURGICEL 2X14 (HEMOSTASIS) ×4 IMPLANT
INSERT FOGARTY XLG (MISCELLANEOUS) IMPLANT
KIT BASIN OR (CUSTOM PROCEDURE TRAY) ×4 IMPLANT
KIT SUCTION CATH 14FR (SUCTIONS) ×4 IMPLANT
KIT TURNOVER KIT B (KITS) ×4 IMPLANT
KIT VASOVIEW HEMOPRO VH 3000 (KITS) ×4 IMPLANT
LEAD PACING MYOCARDI (MISCELLANEOUS) ×4 IMPLANT
MARKER GRAFT CORONARY BYPASS (MISCELLANEOUS) ×12 IMPLANT
NS IRRIG 1000ML POUR BTL (IV SOLUTION) ×20 IMPLANT
PACK E OPEN HEART (SUTURE) ×4 IMPLANT
PACK OPEN HEART (CUSTOM PROCEDURE TRAY) ×4 IMPLANT
PAD ARMBOARD 7.5X6 YLW CONV (MISCELLANEOUS) ×8 IMPLANT
PAD ELECT DEFIB RADIOL ZOLL (MISCELLANEOUS) ×4 IMPLANT
PENCIL BUTTON HOLSTER BLD 10FT (ELECTRODE) ×4 IMPLANT
POWDER SURGICEL 3.0 GRAM (HEMOSTASIS) ×2 IMPLANT
PUNCH AORTIC ROT 4.0MM RCL 40 (MISCELLANEOUS) ×2 IMPLANT
PUNCH AORTIC ROTATE 4.0MM (MISCELLANEOUS) IMPLANT
PUNCH AORTIC ROTATE 4.5MM 8IN (MISCELLANEOUS) IMPLANT
PUNCH AORTIC ROTATE 5MM 8IN (MISCELLANEOUS) IMPLANT
SET CARDIOPLEGIA MPS 5001102 (MISCELLANEOUS) ×2 IMPLANT
SOLUTION ANTI FOG 6CC (MISCELLANEOUS) ×2 IMPLANT
SPONGE LAP 18X18 X RAY DECT (DISPOSABLE) ×6 IMPLANT
SPONGE LAP 4X18 RFD (DISPOSABLE) ×2 IMPLANT
STOPCOCK 4 WAY LG BORE MALE ST (IV SETS) ×2 IMPLANT
SURGIFLO W/THROMBIN 8M KIT (HEMOSTASIS) ×2 IMPLANT
SUT BONE WAX W31G (SUTURE) ×6 IMPLANT
SUT MNCRL AB 4-0 PS2 18 (SUTURE) ×2 IMPLANT
SUT PROLENE 3 0 SH DA (SUTURE) IMPLANT
SUT PROLENE 3 0 SH1 36 (SUTURE) IMPLANT
SUT PROLENE 4 0 RB 1 (SUTURE) ×4
SUT PROLENE 4 0 SH DA (SUTURE) ×4 IMPLANT
SUT PROLENE 4-0 RB1 .5 CRCL 36 (SUTURE) ×2 IMPLANT
SUT PROLENE 5 0 C 1 36 (SUTURE) IMPLANT
SUT PROLENE 6 0 C 1 30 (SUTURE) ×6 IMPLANT
SUT PROLENE 6 0 CC (SUTURE) ×20 IMPLANT
SUT PROLENE 8 0 BV175 6 (SUTURE) IMPLANT
SUT PROLENE BLUE 7 0 (SUTURE) ×6 IMPLANT
SUT SILK  1 MH (SUTURE)
SUT SILK 1 MH (SUTURE) IMPLANT
SUT SILK 2 0 SH CR/8 (SUTURE) ×2 IMPLANT
SUT SILK 3 0 SH CR/8 (SUTURE) IMPLANT
SUT STEEL 6MS V (SUTURE) ×8 IMPLANT
SUT STEEL SZ 6 DBL 3X14 BALL (SUTURE) ×4 IMPLANT
SUT VIC AB 1 CTX 36 (SUTURE) ×20
SUT VIC AB 1 CTX36XBRD ANBCTR (SUTURE) ×4 IMPLANT
SUT VIC AB 2-0 CT1 27 (SUTURE) ×4
SUT VIC AB 2-0 CT1 TAPERPNT 27 (SUTURE) IMPLANT
SUT VIC AB 2-0 CTX 27 (SUTURE) IMPLANT
SUT VIC AB 2-0 CTX 36 (SUTURE) ×6 IMPLANT
SUT VIC AB 3-0 X1 27 (SUTURE) IMPLANT
SYSTEM SAHARA CHEST DRAIN ATS (WOUND CARE) ×4 IMPLANT
TAPE CLOTH SURG 4X10 WHT LF (GAUZE/BANDAGES/DRESSINGS) ×4 IMPLANT
TAPE PAPER 2X10 WHT MICROPORE (GAUZE/BANDAGES/DRESSINGS) ×2 IMPLANT
TOWEL GREEN STERILE (TOWEL DISPOSABLE) ×4 IMPLANT
TOWEL GREEN STERILE FF (TOWEL DISPOSABLE) ×6 IMPLANT
TRAY CATH LUMEN 1 20CM STRL (SET/KITS/TRAYS/PACK) ×2 IMPLANT
TRAY FOLEY SLVR 16FR TEMP STAT (SET/KITS/TRAYS/PACK) ×2 IMPLANT
TUBING ART PRESS 48 MALE/FEM (TUBING) ×4 IMPLANT
TUBING ART PRESS 72  MALE/FEM (TUBING) ×2
TUBING ART PRESS 72 MALE/FEM (TUBING) IMPLANT
TUBING INSUFFLATION (TUBING) ×4 IMPLANT
UNDERPAD 30X30 (UNDERPADS AND DIAPERS) ×4 IMPLANT
WATER STERILE IRR 1000ML POUR (IV SOLUTION) ×8 IMPLANT

## 2017-08-29 NOTE — Anesthesia Procedure Notes (Signed)
Central Venous Catheter Insertion Performed by: Effie Berkshire, MD, anesthesiologist Start/End5/17/2019 7:25 AM, 08/29/2017 7:30 AM Patient location: Pre-op. Preanesthetic checklist: patient identified, IV checked, site marked, risks and benefits discussed, surgical consent, monitors and equipment checked, pre-op evaluation, timeout performed and anesthesia consent Hand hygiene performed  and maximum sterile barriers used  PA cath was placed.Swan type:thermodilution Procedure performed without using ultrasound guided technique. Attempts: 1 Patient tolerated the procedure well with no immediate complications.

## 2017-08-29 NOTE — Brief Op Note (Signed)
08/27/2017 - 08/29/2017  10:57 AM  PATIENT:  Norma [name]  67 y.o. female  PRE-OPERATIVE DIAGNOSIS:  CAD  POST-OPERATIVE DIAGNOSIS:  CAD  PROCEDURE:  Procedure(s): CORONARY ARTERY BYPASS GRAFTING (CABG) x 3 WITH ENDOSCOPIC HARVESTING OF RIGHT SAPHENOUS VEIN (N/A) TRANSESOPHAGEAL ECHOCARDIOGRAM (TEE) (N/A)  SURGEON:  Surgeon(s) and Role:    Ivin Poot, MD - Primary  PHYSICIAN ASSISTANT:  Nicholes Rough, PA-C   ANESTHESIA:   general  EBL:  1250 mL   BLOOD ADMINISTERED:none  DRAINS: ROUTINE   LOCAL MEDICATIONS USED:  NONE  SPECIMEN:  No Specimen  DISPOSITION OF SPECIMEN:  n/a  COUNTS:  YES  DICTATION: .Dragon Dictation  PLAN OF CARE: Admit to inpatient   PATIENT DISPOSITION:  ICU - intubated and hemodynamically stable.   Delay start of Pharmacological VTE agent (>24hrs) due to surgical blood loss or risk of bleeding: yes

## 2017-08-29 NOTE — Progress Notes (Signed)
Patient ID: Norma [name], female   DOB: 05/10/1950, 67 y.o.   MRN: [id number] EVENING ROUNDS NOTE :     [address]       [place] [address]             [phone]                 Day of Surgery Procedure(s) (LRB): CORONARY ARTERY BYPASS GRAFTING (CABG) x 3 WITH ENDOSCOPIC HARVESTING OF RIGHT SAPHENOUS VEIN (N/A) TRANSESOPHAGEAL ECHOCARDIOGRAM (TEE) (N/A)  Total Length of Stay:  LOS: 2 days  BP (!) 107/50   Pulse 89   Temp 97.7 F (36.5 C)   Resp (!) 21   Ht 5\' 4"  (1.626 m)   Wt 258 lb 6.1 oz (117.2 kg)   SpO2 92%   BMI 44.35 kg/m   .Intake/Output      05/16 0701 - 05/17 0700 05/17 0701 - 05/18 0700   P.O. 0    I.V. (mL/kg) 462.2 (3.9) 3323.9 (28.4)   Blood  1335   IV Piggyback  1550   Total Intake(mL/kg) 462.2 (3.9) 6208.9 (53)   Urine (mL/kg/hr) 650 (0.2) 825 (0.6)   Other  2700   Blood  1250   Chest Tube  100   Total Output 650 4875   Net -187.8 +1333.9          . sodium chloride 10 mL/hr at 08/29/17 1600  . [START ON 08/30/2017] sodium chloride    . sodium chloride 10 mL/hr at 08/29/17 1600  . albumin human    . dexmedetomidine (PRECEDEX) IV infusion 0.7 mcg/kg/hr (08/29/17 1802)  . DOPamine 2.5 mcg/kg/min (08/29/17 1640)  . famotidine (PEPCID) IV Stopped (08/29/17 1614)  . insulin (NOVOLIN-R) infusion 7.3 Units/hr (08/29/17 1802)  . lactated ringers 10 mL/hr at 08/29/17 1600  . lactated ringers Stopped (08/29/17 1700)  . [START ON 08/30/2017] levofloxacin (LEVAQUIN) IV    . magnesium sulfate 4 g (08/29/17 1537)  . milrinone 0.25 mcg/kg/min (08/29/17 1640)  . nitroGLYCERIN Stopped (08/29/17 1538)  . norepinephrine (LEVOPHED) Adult infusion 8 mcg/min (08/29/17 1719)  . phenylephrine (NEO-SYNEPHRINE) Adult infusion 60 mcg/min (08/29/17 1643)  . potassium chloride 10 mEq (08/29/17 1740)  . vancomycin       Lab Results  Component Value Date   WBC 27.2 (H) 08/29/2017   HGB 11.2 (L) 08/29/2017   HCT 33.0 (L) 08/29/2017   PLT 218  08/29/2017   GLUCOSE 161 (H) 08/29/2017   CHOL 161 07/10/2016   TRIG 153 (H) 07/10/2016   HDL 47 (L) 07/10/2016   LDLCALC 83 07/10/2016   ALT 14 08/28/2017   AST 16 08/28/2017   NA 143 08/29/2017   K 3.8 08/29/2017   CL 106 08/29/2017   CREATININE 0.70 08/29/2017   BUN 13 08/29/2017   CO2 25 08/29/2017   TSH 3.71 07/11/2017   INR 1.23 08/29/2017   HGBA1C 7.2 (H) 08/28/2017   MICROALBUR 1.5 07/10/2016    postp today Neuro intact Still on vent fio2 80 % keep on vent until o2 needs decreased  Grace Isaac MD  Beeper [phone] Office [phone] 08/29/2017 6:14 PM

## 2017-08-29 NOTE — Transfer of Care (Addendum)
Immediate Anesthesia Transfer of Care Note  Patient: Norma [name]  Procedure(s) Performed: CORONARY ARTERY BYPASS GRAFTING (CABG) x 3 WITH ENDOSCOPIC HARVESTING OF RIGHT SAPHENOUS VEIN (N/A Chest) TRANSESOPHAGEAL ECHOCARDIOGRAM (TEE) (N/A )  Patient Location: PACU  Anesthesia Type:General  Level of Consciousness: sedated and Patient remains intubated per anesthesia plan  Airway & Oxygen Therapy: Patient remains intubated per anesthesia plan and Patient placed on Ventilator (see vital sign flow sheet for setting)  Post-op Assessment: Report given to RN and Post -op Vital signs reviewed and stable  Post vital signs: Reviewed and stable  Last Vitals:  Vitals Value Taken Time  HR 90 (paced) BP 105/51 (per Fem Aline) PA 36/15 SpO2 94%  Last Pain:  Vitals:   08/29/17 0556  TempSrc: Oral  PainSc:       Patients Stated Pain Goal: 5 (32/35/57 3220)  Complications: No apparent anesthesia complications

## 2017-08-29 NOTE — Anesthesia Procedure Notes (Signed)
Procedure Name: Intubation Date/Time: 08/29/2017 8:26 AM Performed by: Lowella Dell, CRNA Pre-anesthesia Checklist: Patient identified, Emergency Drugs available, Suction available and Patient being monitored Patient Re-evaluated:Patient Re-evaluated prior to induction Oxygen Delivery Method: Circle System Utilized Preoxygenation: Pre-oxygenation with 100% oxygen Induction Type: IV induction Ventilation: Mask ventilation without difficulty and Oral airway inserted - appropriate to patient size Laryngoscope Size: Mac and 3 Grade View: Grade I Tube type: Oral Number of attempts: 1 Airway Equipment and Method: Stylet Placement Confirmation: ETT inserted through vocal cords under direct vision,  positive ETCO2 and breath sounds checked- equal and bilateral Secured at: 22 cm Tube secured with: Tape Dental Injury: Teeth and Oropharynx as per pre-operative assessment

## 2017-08-29 NOTE — Anesthesia Procedure Notes (Signed)
Arterial Line Insertion Start/End5/17/2019 7:00 AM, 08/29/2017 7:15 AM Performed by: Lowella Dell, CRNA, CRNA  Preanesthetic checklist: patient identified, IV checked, site marked, risks and benefits discussed, surgical consent, monitors and equipment checked, pre-op evaluation, timeout performed and anesthesia consent Lidocaine 1% used for infiltration and patient sedated Right was placed Catheter size: 20 G Hand hygiene performed  and maximum sterile barriers used   Attempts: 2 Procedure performed without using ultrasound guided technique. Following insertion, Biopatch and dressing applied. Post procedure assessment: normal  Patient tolerated the procedure well with no immediate complications.

## 2017-08-29 NOTE — Progress Notes (Signed)
Pre Procedure note for inpatients:   Norma [name] has been scheduled for Procedure(s): CORONARY ARTERY BYPASS GRAFTING (CABG) (N/A) TRANSESOPHAGEAL ECHOCARDIOGRAM (TEE) (N/A) today. The various methods of treatment have been discussed with the patient. After consideration of the risks, benefits and treatment options the patient has consented to the planned procedure.   The patient has been seen and labs reviewed. There are no changes in the patient's condition to prevent proceeding with the planned procedure today.  Recent labs:  Lab Results  Component Value Date   WBC 10.8 (H) 08/29/2017   HGB 10.7 (L) 08/29/2017   HCT 34.1 (L) 08/29/2017   PLT 316 08/29/2017   GLUCOSE 128 (H) 08/29/2017   CHOL 161 07/10/2016   TRIG 153 (H) 07/10/2016   HDL 47 (L) 07/10/2016   LDLCALC 83 07/10/2016   ALT 14 08/28/2017   AST 16 08/28/2017   NA 141 08/29/2017   K 3.7 08/29/2017   CL 106 08/29/2017   CREATININE 1.01 (H) 08/29/2017   BUN 11 08/29/2017   CO2 25 08/29/2017   TSH 3.71 07/11/2017   INR 1.05 08/28/2017   HGBA1C 7.2 (H) 08/28/2017   MICROALBUR 1.5 07/10/2016    Len Childs, MD 08/29/2017 7:16 AM

## 2017-08-29 NOTE — Consult Note (Signed)
            [facility] CM Primary Care Navigator  08/29/2017  [name] [birth year] [id number]   Went to see patientearlier today at the bedsideto identify possible discharge needs but she is off the unit, in the OR per staff report. (Coronary Artery Bypass Grafting- CABG x 3).  Will attempt to meet patient at another time when she is available in the room.    Addendum:  Went back to see patient but she was transferred to ICU ([place] 22- intubated) after surgery.  Will attempt to see patient at another time when she is out of ICU.   For additional questions please contact:  Edwena Felty A. Darshay Deupree, BSN, RN-BC [facility] PRIMARY CARE Navigator Cell: [phone]

## 2017-08-29 NOTE — Anesthesia Procedure Notes (Addendum)
Arterial Line Insertion Start/End5/17/2019 8:25 AM, 08/29/2017 8:30 AM Performed by: Effie Berkshire, MD, anesthesiologist  Patient location: Pre-op. Preanesthetic checklist: patient identified, IV checked, site marked, risks and benefits discussed, surgical consent, monitors and equipment checked, pre-op evaluation, timeout performed and anesthesia consent Lidocaine 1% used for infiltration Left, radial was placed Catheter size: 20 Fr Hand hygiene performed  and maximum sterile barriers used   Attempts: 1 Procedure performed without using ultrasound guided technique. Following insertion, dressing applied and Biopatch. Post procedure assessment: normal and unchanged  Patient tolerated the procedure well with no immediate complications.

## 2017-08-29 NOTE — Anesthesia Procedure Notes (Signed)
Central Venous Catheter Insertion Performed by: Effie Berkshire, MD, anesthesiologist Start/End5/17/2019 7:15 AM, 08/29/2017 7:25 AM Patient location: Pre-op. Preanesthetic checklist: patient identified, IV checked, site marked, risks and benefits discussed, surgical consent, monitors and equipment checked, pre-op evaluation, timeout performed and anesthesia consent Position: Trendelenburg Lidocaine 1% used for infiltration and patient sedated Hand hygiene performed , maximum sterile barriers used  and Seldinger technique used Catheter size: 9 Fr Total catheter length 10. Central line was placed.Sheath introducer Swan type:thermodilution PA Cath depth:50 Procedure performed using ultrasound guided technique. Ultrasound Notes:anatomy identified, needle tip was noted to be adjacent to the nerve/plexus identified, no ultrasound evidence of intravascular and/or intraneural injection and image(s) printed for medical record Attempts: 1 Following insertion, line sutured and dressing applied. Post procedure assessment: blood return through all ports, free fluid flow and no air  Patient tolerated the procedure well with no immediate complications.

## 2017-08-29 NOTE — Progress Notes (Signed)
  Echocardiogram Echocardiogram Transesophageal has been performed.  Merrie Roof F 08/29/2017, 10:46 AM

## 2017-08-29 NOTE — Anesthesia Procedure Notes (Deleted)
Arterial Line Insertion Start/End5/17/2019 9:00 AM, 08/29/2017 9:05 AM Performed by: attending  Patient location: OR. Right, femoral was placed Maximum sterile barriers used   Procedure performed without using ultrasound guided technique. Additional procedure comments: R Femoral arterial line placed by Dr Darcey Nora in [name]

## 2017-08-30 ENCOUNTER — Inpatient Hospital Stay (HOSPITAL_COMMUNITY): Payer: PPO

## 2017-08-30 LAB — BASIC METABOLIC PANEL
Anion gap: 6 (ref 5–15)
BUN: 10 mg/dL (ref 6–20)
CALCIUM: 8.1 mg/dL — AB (ref 8.9–10.3)
CO2: 24 mmol/L (ref 22–32)
CREATININE: 0.77 mg/dL (ref 0.44–1.00)
Chloride: 109 mmol/L (ref 101–111)
GFR calc Af Amer: >60 mL/min (ref >60)
Glucose, Bld: 110 mg/dL — ABNORMAL HIGH (ref 65–99)
Potassium: 4.2 mmol/L (ref 3.5–5.1)
SODIUM: 139 mmol/L (ref 135–145)

## 2017-08-30 LAB — ECHO INTRAOPERATIVE TEE
Height: 64 in
Weight: 4134.4 oz

## 2017-08-30 LAB — BPAM FFP
BLOOD PRODUCT EXPIRATION DATE: 201905222359
BLOOD PRODUCT EXPIRATION DATE: 201905222359
ISSUE DATE / TIME: 201905171304
ISSUE DATE / TIME: 201905171304
UNIT TYPE AND RH: 8400
Unit Type and Rh: 2800

## 2017-08-30 LAB — POCT I-STAT, CHEM 8
BUN: 7 mg/dL (ref 6–20)
Calcium, Ion: 1.19 mmol/L (ref 1.15–1.40)
Chloride: 105 mmol/L (ref 101–111)
Creatinine, Ser: 0.6 mg/dL (ref 0.44–1.00)
Glucose, Bld: 141 mg/dL — ABNORMAL HIGH (ref 65–99)
HEMATOCRIT: 30 % — AB (ref 36.0–46.0)
HEMOGLOBIN: 10.2 g/dL — AB (ref 12.0–15.0)
Potassium: 4.1 mmol/L (ref 3.5–5.1)
Sodium: 138 mmol/L (ref 135–145)
TCO2: 21 mmol/L — AB (ref 22–32)

## 2017-08-30 LAB — PREPARE FRESH FROZEN PLASMA
Unit division: 0
Unit division: 0

## 2017-08-30 LAB — GLUCOSE, CAPILLARY
GLUCOSE-CAPILLARY: 96 mg/dL (ref 65–99)
GLUCOSE-CAPILLARY: 121 mg/dL — AB (ref 65–99)
GLUCOSE-CAPILLARY: 108 mg/dL — AB (ref 65–99)
GLUCOSE-CAPILLARY: 108 mg/dL — AB (ref 65–99)
GLUCOSE-CAPILLARY: 135 mg/dL — AB (ref 65–99)
GLUCOSE-CAPILLARY: 114 mg/dL — AB (ref 65–99)
GLUCOSE-CAPILLARY: 137 mg/dL — AB (ref 65–99)
GLUCOSE-CAPILLARY: 133 mg/dL — AB (ref 65–99)
GLUCOSE-CAPILLARY: 86 mg/dL (ref 65–99)
GLUCOSE-CAPILLARY: 150 mg/dL — AB (ref 65–99)
Glucose-Capillary: 104 mg/dL — ABNORMAL HIGH (ref 65–99)
Glucose-Capillary: 110 mg/dL — ABNORMAL HIGH (ref 65–99)
Glucose-Capillary: 115 mg/dL — ABNORMAL HIGH (ref 65–99)
Glucose-Capillary: 119 mg/dL — ABNORMAL HIGH (ref 65–99)
Glucose-Capillary: 126 mg/dL — ABNORMAL HIGH (ref 65–99)
Glucose-Capillary: 97 mg/dL (ref 65–99)
Glucose-Capillary: 98 mg/dL (ref 65–99)

## 2017-08-30 LAB — POCT I-STAT 3, ART BLOOD GAS (G3+)
Acid-base deficit: 1 mmol/L (ref 0.0–2.0)
Acid-base deficit: 4 mmol/L — ABNORMAL HIGH (ref 0.0–2.0)
Acid-base deficit: 5 mmol/L — ABNORMAL HIGH (ref 0.0–2.0)
BICARBONATE: 20.4 mmol/L (ref 20.0–28.0)
Bicarbonate: 24.9 mmol/L (ref 20.0–28.0)
Bicarbonate: 19.7 mmol/L — ABNORMAL LOW (ref 20.0–28.0)
O2 SAT: 94 %
O2 SAT: 94 %
O2 Saturation: 96 %
PCO2 ART: 45.6 mmHg (ref 32.0–48.0)
PCO2 ART: 33.5 mmHg (ref 32.0–48.0)
PCO2 ART: 33.1 mmHg (ref 32.0–48.0)
PO2 ART: 92 mmHg (ref 83.0–108.0)
Patient temperature: 37.2
Patient temperature: 36.6
Patient temperature: 37.5
TCO2: 26 mmol/L (ref 22–32)
TCO2: 21 mmol/L — AB (ref 22–32)
TCO2: 21 mmol/L — AB (ref 22–32)
pH, Arterial: 7.347 — ABNORMAL LOW (ref 7.350–7.450)
pH, Arterial: 7.39 (ref 7.350–7.450)
pH, Arterial: 7.384 (ref 7.350–7.450)
pO2, Arterial: 70 mmHg — ABNORMAL LOW (ref 83.0–108.0)
pO2, Arterial: 71 mmHg — ABNORMAL LOW (ref 83.0–108.0)

## 2017-08-30 LAB — CBC
HEMATOCRIT: 29.6 % — AB (ref 36.0–46.0)
HEMATOCRIT: 27.8 % — AB (ref 36.0–46.0)
Hemoglobin: 9.8 g/dL — ABNORMAL LOW (ref 12.0–15.0)
Hemoglobin: 9.1 g/dL — ABNORMAL LOW (ref 12.0–15.0)
MCH: 28.8 pg (ref 26.0–34.0)
MCH: 28.5 pg (ref 26.0–34.0)
MCHC: 33.1 g/dL (ref 30.0–36.0)
MCHC: 32.7 g/dL (ref 30.0–36.0)
MCV: 87.1 fL (ref 78.0–100.0)
MCV: 87.1 fL (ref 78.0–100.0)
PLATELETS: 226 10*3/uL (ref 150–400)
Platelets: 205 10*3/uL (ref 150–400)
RBC: 3.4 MIL/uL — AB (ref 3.87–5.11)
RBC: 3.19 MIL/uL — ABNORMAL LOW (ref 3.87–5.11)
RDW: 14.9 % (ref 11.5–15.5)
RDW: 15.1 % (ref 11.5–15.5)
WBC: 31.8 10*3/uL — AB (ref 4.0–10.5)
WBC: 25.9 10*3/uL — AB (ref 4.0–10.5)

## 2017-08-30 LAB — COOXEMETRY PANEL
Carboxyhemoglobin: 1.5 % (ref 0.5–1.5)
Carboxyhemoglobin: 1.6 % — ABNORMAL HIGH (ref 0.5–1.5)
Methemoglobin: 0.9 % (ref 0.0–1.5)
Methemoglobin: 1.1 % (ref 0.0–1.5)
O2 Saturation: 65.3 %
O2 Saturation: 64.6 %
Total hemoglobin: 8.7 g/dL — ABNORMAL LOW (ref 12.0–16.0)
Total hemoglobin: 8.7 g/dL — ABNORMAL LOW (ref 12.0–16.0)

## 2017-08-30 LAB — BLOOD GAS, ARTERIAL
Acid-base deficit: 0.4 mmol/L (ref 0.0–2.0)
Bicarbonate: 23.7 mmol/L (ref 20.0–28.0)
O2 Content: 2 L/min
O2 Saturation: 95.5 %
Patient temperature: 98.6
pCO2 arterial: 38.6 mmHg (ref 32.0–48.0)
pH, Arterial: 7.406 (ref 7.350–7.450)
pO2, Arterial: 76.2 mmHg — ABNORMAL LOW (ref 83.0–108.0)

## 2017-08-30 LAB — CREATININE, SERUM: Creatinine, Ser: 0.81 mg/dL (ref 0.44–1.00)

## 2017-08-30 LAB — MAGNESIUM
MAGNESIUM: 2.3 mg/dL (ref 1.7–2.4)
MAGNESIUM: 2 mg/dL (ref 1.7–2.4)

## 2017-08-30 MED ORDER — INSULIN DETEMIR 100 UNIT/ML ~~LOC~~ SOLN
20.0000 [IU] | Freq: Every day | SUBCUTANEOUS | Status: DC
  Administered 2017-08-30: 20 [IU] via SUBCUTANEOUS
  Filled 2017-08-30 (×2): qty 0.2

## 2017-08-30 MED ORDER — INSULIN DETEMIR 100 UNIT/ML ~~LOC~~ SOLN
20.0000 [IU] | Freq: Every day | SUBCUTANEOUS | Status: DC
  Administered 2017-08-31 – 2017-09-01 (×2): 20 [IU] via SUBCUTANEOUS
  Filled 2017-08-30 (×3): qty 0.2

## 2017-08-30 MED ORDER — INSULIN ASPART 100 UNIT/ML ~~LOC~~ SOLN
0.0000 [IU] | SUBCUTANEOUS | Status: DC
  Administered 2017-08-30 – 2017-08-31 (×3): 2 [IU] via SUBCUTANEOUS
  Administered 2017-08-31: 4 [IU] via SUBCUTANEOUS
  Administered 2017-08-31: 2 [IU] via SUBCUTANEOUS
  Administered 2017-08-31 – 2017-09-01 (×3): 4 [IU] via SUBCUTANEOUS

## 2017-08-30 MED ORDER — FUROSEMIDE 10 MG/ML IJ SOLN
40.0000 mg | Freq: Once | INTRAMUSCULAR | Status: AC
  Administered 2017-08-30: 40 mg via INTRAVENOUS
  Filled 2017-08-30: qty 4

## 2017-08-30 MED ORDER — ENOXAPARIN SODIUM 30 MG/0.3ML ~~LOC~~ SOLN
30.0000 mg | Freq: Every day | SUBCUTANEOUS | Status: DC
  Administered 2017-08-30 – 2017-09-04 (×6): 30 mg via SUBCUTANEOUS
  Filled 2017-08-30 (×6): qty 0.3

## 2017-08-30 NOTE — Op Note (Signed)
NAME: Norma [name], WIGLEY MEDICAL RECORD [id number] ACCOUNT [id number] DATE OF BIRTH:1950-08-30 FACILITY: [facility] LOCATION: [facility] PHYSICIAN:Adrieanna Boteler VAN TRIGT III, MD  OPERATIVE REPORT  DATE OF PROCEDURE:  08/29/2017  PROCEDURE PERFORMED: 1.  Coronary artery bypass grafting x3 (sequential saphenous vein graft diagonal to LAD, saphenous vein graft to obtuse marginal). 2.  Endoscopic harvest of right leg greater saphenous vein.  SURGEON:  Ivin Poot III, MD  ASSISTANT:  Nicholes Rough, PA-C   PREOPERATIVE DIAGNOSES:  Non-ST elevation myocardial infarction, diabetes, morbid obesity, moderate left ventricular dysfunction.  POSTOPERATIVE DIAGNOSES:  Non-ST elevation myocardial infarction, diabetes, morbid obesity, moderate left ventricular dysfunction.  ANESTHESIA:  General.  INDICATIONS:  The patient is a 67 year old morbidly obese, poorly ambulatory female who presents with a non-STEMI with chest pain and shortness of breath and fatigue and decreased exercise tolerance.  Cardiac enzymes are positive.  Echocardiogram shows  EF of 35% to  40% without valvular insufficiency.  Cardiac catheterization by Dr. Burt Knack demonstrates severe multivessel coronary artery disease with high-grade stenosis at the LAD diagonal bifurcation, 90% stenosis of a large circumflex marginal, and  75% to 80% stenosis of a very distal posterior descending.  She was felt to be a high-risk candidate for coronary bypass grafting because of poor coronary anatomy for PCI, her age, and diabetes.  Prior to surgery, I discussed the procedure in detail with the patient including the expected benefits, the alternatives, and the risks.  She understood that we would use cardiopulmonary bypass under general anesthesia.  We discussed the location of the  surgical incisions, and we discussed the expected postoperative hospital recovery.  We discussed the potential risks to her of CABG including the risks of stroke, bleeding, blood  transfusion, postoperative infection, postoperative pulmonary problems  including pleural effusion, postoperative organ failure, and death.  After reviewing these issues, she demonstrated her understanding and agreed to proceed with surgery under what I felt was informed consent.  OPERATIVE FINDINGS: 1.  Severe obesity, making all aspects of the procedure increased level of difficulty including positioning the patient, performing sternotomy, cannulation for bypass, and closure of the sternal incisions. 2.  Saphenous vein harvested endoscopically was of good quality. 3.  Left mammary artery was not harvested because it was too small with poor flow.  Attempt was made to harvest the vessel, but this was not completed due to small-vessel size, poor flow, and extreme difficulty in exposing the vessel due to her body  habitus.  DESCRIPTION OF PROCEDURE:  The patient was brought to the operating room and placed supine on the operating table.  General anesthesia was induced under invasive hemodynamic monitoring.  Multiple attempts at arterial lines were performed including both  radial arterial lines, and then finally I placed a femoral A-line for adequate hemodynamic monitoring.  The patient was positioned and then prepped and draped as a sterile field.  A proper time-out was performed.  A sternal incision was made.  The  saphenous vein was harvested endoscopically from the right leg.  The sternum was elevated.  The left internal mammary artery was visualized but with great difficulty.  It was partially harvested, but it was clear the vessel was extremely small, even  proximally, and blood flow would not be adequate.  The harvest was stopped, and the sternum re-placed to a normal position to avoid any sternal fracture.  The deep sternal ablated retractor was placed, and the pericardium was opened and suspended.  Pursestrings were placed in the ascending aorta and right atrium.  When the vein was harvested,  the patient was heparinized and cannulated and placed on  cardiopulmonary bypass.  The LAD, diagonal and OM were found to be adequate target for grafting.  The distal posterior descending was too small to graft.  Cardioplegia cannulas were placed, both antegrade and retrograde cold blood cardioplegia, and the  vein was prepared for the distal anastomosis.  Cardioplegia cannulas were placed in both antegrade and retrograde cold blood cardioplegia, and the patient was cooled to 32 degrees.  The crossclamp was applied, and 1 L of cold blood cardioplegia was delivered in split doses between the antegrade aortic and retrograde coronary sinus catheters.  There was good cardioplegic arrest, and supple temperature dropped to less than 12 degrees.   Cardioplegia was delivered every 20 minutes.  The first coronary anastomoses were the sequential vein graft to the diagonal and to the mid-LAD.  The diagonal was a 1.5 mm vessel and actual 90% stenosis.  The vein was sewn side-to-side with running 7-0 Prolene, and there was good flow through the  graft.  This was then continued as a sequential vein graft to the mid-LAD.  Here, the LAD was 1.4 mm.  The end of the vein was sewn end-to-side with running 7-0 Prolene, and there was good flow through the sequential vein graft.  Cardioplegia was  redosed.  A third distal anastomosis was the obtuse marginal branch of the left coronary.  This was a 1.5 mm vessel with proximal 90% stenosis.  The reverse saphenous vein was sewn end-to-side with running 7-0 Prolene with good flow through the graft.   Cardioplegia was redosed.  The cross clamp was now placed.  Two proximal vein anastomoses were performed on the ascending aorta using a 4.0 mm punch and running 6-0 Prolene.  Prior to tying down the final anastomosis, air was vented from the coronaries with a dose of retrograde  warm blood cardioplegia.  The crossclamp was removed.  The heart resumed a spontaneous rhythm.  The  vein grafts were deaired and opened.  Each had good flow, and hemostasis was documented to the proximal and distal sites.  The patient was rewarmed and reperfused.  Temporary pacing wires were applied.  The  cardioplegia cannula was removed.  The lungs were expanded.  The ventilator was resumed.  The patient was then weaned from cardiopulmonary bypass without difficulty.  Echo showed global LV function to be improved.  Hemodynamics were stable.  Protamine  was administered without adverse reaction.  The cannula was removed.  The mediastinum was irrigated.  The patient still had coagulopathy from her preoperative Plavix and reduced platelet function level and was given two FFPs with improved coagulation  function.  The superior pericardial fat was closed over the aorta and vein grafts.  Anterior mediastinal left pleural chest tube was placed and brought out through separate incisions.  The sternum was closed with a wire.  The pectoralis fascia was closed with a #1  Vicryl.  Subcutaneous and skin layers were closed with running Vicryl.  Total cardiopulmonary bypass time was 111 minutes.  LN/NUANCE  D:08/30/2017 T:08/30/2017 JOB:[id number]

## 2017-08-30 NOTE — Progress Notes (Signed)
Patient ID: Norma [name], female   DOB: [birth year], 67 y.o.   MRN: [id number] EVENING ROUNDS NOTE :     [address]       [place] [address]             [phone]                 1 Day Post-Op Procedure(s) (LRB): CORONARY ARTERY BYPASS GRAFTING (CABG) x 3 WITH ENDOSCOPIC HARVESTING OF RIGHT SAPHENOUS VEIN (N/A) TRANSESOPHAGEAL ECHOCARDIOGRAM (TEE) (N/A)  Total Length of Stay:  LOS: 3 days  BP (!) 115/59   Pulse 98   Temp 99.1 F (37.3 C)   Resp (!) 23   Ht 5\' 4"  (1.626 m)   Wt 277 lb 12.5 oz (126 kg)   SpO2 95%   BMI 47.68 kg/m   .Intake/Output      05/18 0701 - 05/19 0700   P.O.    I.V. (mL/kg) 913 (7.2)   Blood    IV Piggyback 900   Total Intake(mL/kg) 1813 (14.4)   Urine (mL/kg/hr) 700 (0.4)   Emesis/NG output    Other    Blood    Chest Tube 305   Total Output 1005   Net +808         . sodium chloride 10 mL/hr at 08/30/17 1900  . sodium chloride 250 mL (08/30/17 0517)  . sodium chloride 10 mL/hr at 08/30/17 1900  . albumin human    . dexmedetomidine (PRECEDEX) IV infusion Stopped (08/30/17 1119)  . DOPamine 2.5 mcg/kg/min (08/30/17 1900)  . insulin (NOVOLIN-R) infusion Stopped (08/30/17 1600)  . lactated ringers 10 mL/hr at 08/30/17 1900  . lactated ringers Stopped (08/29/17 1700)  . levofloxacin (LEVAQUIN) IV Stopped (08/30/17 1030)  . milrinone 0.25 mcg/kg/min (08/30/17 1907)  . nitroGLYCERIN Stopped (08/29/17 1538)  . norepinephrine (LEVOPHED) Adult infusion Stopped (08/30/17 1404)  . phenylephrine (NEO-SYNEPHRINE) Adult infusion Stopped (08/30/17 1047)     Lab Results  Component Value Date   WBC 25.9 (H) 08/30/2017   HGB 10.2 (L) 08/30/2017   HCT 30.0 (L) 08/30/2017   PLT 205 08/30/2017   GLUCOSE 141 (H) 08/30/2017   CHOL 161 07/10/2016   TRIG 153 (H) 07/10/2016   HDL 47 (L) 07/10/2016   LDLCALC 83 07/10/2016   ALT 14 08/28/2017   AST 16 08/28/2017   NA 138 08/30/2017   K 4.1 08/30/2017   CL 105 08/30/2017   CREATININE 0.60 08/30/2017   BUN 7 08/30/2017   CO2 24 08/30/2017   TSH 3.71 07/11/2017   INR 1.23 08/29/2017   HGBA1C 7.2 (H) 08/28/2017   MICROALBUR 1.5 07/10/2016   Stable day, tolerated extubation  Spontaneous diuresis    Grace Isaac MD  Beeper [phone] Office [phone] 08/30/2017 7:44 PM

## 2017-08-30 NOTE — Procedures (Signed)
Extubation Procedure Note  Patient Details:   Name: Norma [name] DOB: 1950/10/20 MRN: [id number]   Airway Documentation:  Airway (Active)  Secured at (cm) 23 cm 08/30/2017  8:41 AM  Measured From Lips 08/30/2017  8:41 AM  Secured Location Right 08/30/2017  8:41 AM  Secured By Rana Snare Tape 08/30/2017  8:41 AM  Cuff Pressure (cm H2O) 22 cm H2O 08/30/2017  8:41 AM  Site Condition Dry 08/30/2017  3:53 AM   Vent end date: (not recorded) Vent end time: (not recorded)   Evaluation  O2 sats: stable throughout Complications: No apparent complications Patient did tolerate procedure well. Bilateral Breath Sounds: Diminished   Yes  Positive cuff leak   Hope Pigeon 08/30/2017, 11:15 AM

## 2017-08-30 NOTE — Anesthesia Postprocedure Evaluation (Signed)
Anesthesia Post Note  Patient: Norma [name]  Procedure(s) Performed: CORONARY ARTERY BYPASS GRAFTING (CABG) x 3 WITH ENDOSCOPIC HARVESTING OF RIGHT SAPHENOUS VEIN (N/A Chest) TRANSESOPHAGEAL ECHOCARDIOGRAM (TEE) (N/A )     Patient location during evaluation: SICU Anesthesia Type: General Level of consciousness: sedated Pain management: pain level controlled Vital Signs Assessment: post-procedure vital signs reviewed and stable Respiratory status: patient remains intubated per anesthesia plan Cardiovascular status: stable Postop Assessment: no apparent nausea or vomiting Anesthetic complications: no Comments: Weaning ventilator    Last Vitals:  Vitals:   08/30/17 0915 08/30/17 0930  BP:    Pulse: 89 89  Resp: 16 14  Temp: 36.6 C 36.5 C  SpO2: 98% 97%    Last Pain:  Vitals:   08/30/17 0815  TempSrc:   PainSc: 0-No pain                 Effie Berkshire

## 2017-08-30 NOTE — Progress Notes (Signed)
Patient ID: YAFFA SECKMAN, female   DOB: [birth year], 67 y.o.   MRN: [id number] TCTS DAILY ICU PROGRESS NOTE                   [address]            [place] [address]          [phone]   1 Day Post-Op Procedure(s) (LRB): CORONARY ARTERY BYPASS GRAFTING (CABG) x 3 WITH ENDOSCOPIC HARVESTING OF RIGHT SAPHENOUS VEIN (N/A) TRANSESOPHAGEAL ECHOCARDIOGRAM (TEE) (N/A)  Total Length of Stay:  LOS: 3 days   Subjective: Patient awake neurologically intact, remains on the ventilator, weaning FiO2 currently on 50%  Objective: Vital signs in last 24 hours: Temp:  [96.1 F (35.6 C)-99.7 F (37.6 C)] 97.9 F (36.6 C) (05/18 0815) Pulse Rate:  [88-90] 90 (05/18 0841) Cardiac Rhythm: Atrial paced (05/18 0800) Resp:  [12-30] 17 (05/18 0841) BP: (93-125)/(50-69) 125/60 (05/18 0841) SpO2:  [90 %-100 %] 99 % (05/18 0844) Arterial Line BP: (80-148)/(47-83) 93/61 (05/18 0815) FiO2 (%):  [50 %-80 %] 50 % (05/18 0844) Weight:  [258 lb 6.1 oz (117.2 kg)-277 lb 12.5 oz (126 kg)] 277 lb 12.5 oz (126 kg) (05/18 0500)  Filed Weights   08/28/17 0348 08/29/17 1600 08/30/17 0500  Weight: 258 lb 6.4 oz (117.2 kg) 258 lb 6.1 oz (117.2 kg) 277 lb 12.5 oz (126 kg)    Weight change:    Hemodynamic parameters for last 24 hours: PAP: (28-37)/(13-22) 37/22 CO:  [6.2 L/min-7.2 L/min] 6.2 L/min CI:  [2.3 L/min/m2-3.3 L/min/m2] 2.8 L/min/m2  Intake/Output from previous day: 05/17 0701 - 05/18 0700 In: 8413.3 [I.V.:5028.3; Blood:1335; IV Piggyback:2050] Out: 1583 [Urine:1990; Emesis/NG output:150; Blood:1250; Chest Tube:325]  Intake/Output this shift: Total I/O In: 154.8 [I.V.:154.8] Out: 85 [Urine:75; Chest Tube:10]  Current Meds: Scheduled Meds: . acetaminophen  1,000 mg Oral Q6H   Or  . acetaminophen (TYLENOL) oral liquid 160 mg/5 mL  1,000 mg Per Tube Q6H  . aspirin EC  325 mg Oral Daily   Or  . aspirin  324 mg Per Tube Daily  . baclofen  10 mg Oral QHS  . bisacodyl  10  mg Oral Daily   Or  . bisacodyl  10 mg Rectal Daily  . buPROPion  300 mg Oral q morning - 10a  . chlorhexidine gluconate (MEDLINE KIT)  15 mL Mouth Rinse BID  . Chlorhexidine Gluconate Cloth  6 each Topical Daily  . docusate sodium  200 mg Oral Daily  . gabapentin  900 mg Oral BID  . insulin regular  0-10 Units Intravenous TID WC  . lamoTRIgine  150 mg Oral QHS  . levalbuterol  1.25 mg Nebulization Q6H  . levothyroxine  100 mcg Oral QAC breakfast  . mouth rinse  15 mL Mouth Rinse QID  . metoCLOPramide (REGLAN) injection  10 mg Intravenous Q6H  . metoprolol tartrate  12.5 mg Oral BID   Or  . metoprolol tartrate  12.5 mg Per Tube BID  . [START ON 08/31/2017] pantoprazole  40 mg Oral Daily  . rOPINIRole  2 mg Oral Daily   And  . rOPINIRole  4 mg Oral QHS  . sodium chloride flush  10-40 mL Intracatheter Q12H  . sodium chloride flush  3 mL Intravenous Q12H  . traZODone  50 mg Oral QHS  . venlafaxine XR  300 mg Oral QHS   Continuous Infusions: . sodium chloride 10 mL/hr at 08/30/17 0800  . sodium  chloride 250 mL (08/30/17 0517)  . sodium chloride 10 mL/hr at 08/30/17 0800  . albumin human    . dexmedetomidine (PRECEDEX) IV infusion 0.7 mcg/kg/hr (08/30/17 0854)  . DOPamine 2.5 mcg/kg/min (08/30/17 0800)  . insulin (NOVOLIN-R) infusion 3.2 Units/hr (08/30/17 0905)  . lactated ringers 10 mL/hr at 08/30/17 0800  . lactated ringers Stopped (08/29/17 1700)  . levofloxacin (LEVAQUIN) IV 750 mg (08/30/17 0900)  . milrinone 0.25 mcg/kg/min (08/30/17 0800)  . nitroGLYCERIN Stopped (08/29/17 1538)  . norepinephrine (LEVOPHED) Adult infusion 2 mcg/min (08/30/17 0849)  . phenylephrine (NEO-SYNEPHRINE) Adult infusion 50 mcg/min (08/30/17 0857)   PRN Meds:.sodium chloride, fentaNYL (SUBLIMAZE) injection, metoprolol tartrate, midazolam, ondansetron (ZOFRAN) IV, sodium chloride flush, sodium chloride flush, traMADol  General appearance: alert, cooperative and no distress Neurologic:  intact Heart: regular rate and rhythm, S1, S2 normal, no murmur, click, rub or gallop Lungs: diminished breath sounds bibasilar Abdomen: soft, non-tender; bowel sounds normal; no masses,  no organomegaly Extremities: extremities normal, atraumatic, no cyanosis or edema and Homans sign is negative, no sign of DVT Wound: Sternum intact  Lab Results: CBC: Recent Labs    08/29/17 2106 08/29/17 2111 08/30/17 0444  WBC 26.6*  --  31.8*  HGB 10.4* 10.2* 9.8*  HCT 31.9* 30.0* 29.6*  PLT 238  --  226   BMET:  Recent Labs    08/29/17 0508  08/29/17 2111 08/30/17 0444  NA 141   < > 141 139  K 3.7   < > 4.4 4.2  CL 106   < > 106 109  CO2 25  --   --  24  GLUCOSE 128*   < > 148* 110*  BUN 11   < > 12 10  CREATININE 1.01*   < > 0.60 0.77  CALCIUM 8.9  --   --  8.1*   < > = values in this interval not displayed.    CMET: Lab Results  Component Value Date   WBC 31.8 (H) 08/30/2017   HGB 9.8 (L) 08/30/2017   HCT 29.6 (L) 08/30/2017   PLT 226 08/30/2017   GLUCOSE 110 (H) 08/30/2017   CHOL 161 07/10/2016   TRIG 153 (H) 07/10/2016   HDL 47 (L) 07/10/2016   LDLCALC 83 07/10/2016   ALT 14 08/28/2017   AST 16 08/28/2017   NA 139 08/30/2017   K 4.2 08/30/2017   CL 109 08/30/2017   CREATININE 0.77 08/30/2017   BUN 10 08/30/2017   CO2 24 08/30/2017   TSH 3.71 07/11/2017   INR 1.23 08/29/2017   HGBA1C 7.2 (H) 08/28/2017   MICROALBUR 1.5 07/10/2016      PT/INR:  Recent Labs    08/29/17 1503  LABPROT 15.4*  INR 1.23   Radiology: Dg Chest Port 1 View  Result Date: 08/30/2017 CLINICAL DATA:  CABG. EXAM: PORTABLE CHEST 1 VIEW COMPARISON:  08/29/2017. FINDINGS: Endotracheal tube terminates 4.8 cm above the carina. Right IJ size Swan-Ganz catheter projects over the proximal right pulmonary artery. Nasogastric tube is followed into the stomach with the tip projecting beyond the inferior margin of the image. Mediastinal drain and left chest tube are in place. Improving bilateral  perihilar aeration with minimal residual interstitial prominence. Left lower lobe subsegmental atelectasis. No definite pleural fluid although the right costophrenic angle is not included on the image. No pneumothorax. IMPRESSION: 1. Improving bilateral perihilar aeration with mild residual edema and/or atelectasis. 2. No pneumothorax. Electronically Signed   By: Lorin Picket M.D.   On: 08/30/2017  08:08   Dg Chest Port 1 View  Result Date: 08/29/2017 CLINICAL DATA:  67 year old female postoperative day zero status post CABG. EXAM: PORTABLE CHEST 1 VIEW COMPARISON:  08/22/2017 and earlier. FINDINGS: Portable AP supine view at 1530 hours. Endotracheal tube tip at the level the clavicles. Enteric tube courses to the left upper quadrant, side hole the level of the gastric body. Mediastinal tube in place. Right IJ approach Swan-Ganz catheter with tip at the proximal right main pulmonary artery level. Left chest tube in place. Low lung volumes. Stable cardiac size and mediastinal contours. No pneumothorax is identified. There is patchy and confluent right greater than left perihilar opacity. Atelectasis is favored. The pulmonary vascularity appears within normal limits. No pleural effusion or consolidation identified. IMPRESSION: 1. Lines and tubes appear appropriately placed. 2. No pneumothorax or pulmonary edema. Low lung volumes with perihilar opacity favored to be atelectasis. Electronically Signed   By: Genevie Ann M.D.   On: 08/29/2017 15:42     Assessment/Plan: S/P Procedure(s) (LRB): CORONARY ARTERY BYPASS GRAFTING (CABG) x 3 WITH ENDOSCOPIC HARVESTING OF RIGHT SAPHENOUS VEIN (N/A) TRANSESOPHAGEAL ECHOCARDIOGRAM (TEE) (N/A) Mobilize Diuresis Diabetes control Expected blood loss anemia Weaning FiO2 and then wean toward extubation Remains on low-dose Levophed and neo- Renal function stable  Grace Isaac 08/30/2017 9:19 AM

## 2017-08-31 ENCOUNTER — Inpatient Hospital Stay (HOSPITAL_COMMUNITY): Payer: PPO

## 2017-08-31 ENCOUNTER — Other Ambulatory Visit: Payer: Self-pay

## 2017-08-31 LAB — COOXEMETRY PANEL
Carboxyhemoglobin: 1.2 % (ref 0.5–1.5)
Methemoglobin: 1.8 % — ABNORMAL HIGH (ref 0.0–1.5)
O2 Saturation: 57.1 %
Total hemoglobin: 8.5 g/dL — ABNORMAL LOW (ref 12.0–16.0)

## 2017-08-31 LAB — GLUCOSE, CAPILLARY
GLUCOSE-CAPILLARY: 101 mg/dL — AB (ref 65–99)
GLUCOSE-CAPILLARY: 166 mg/dL — AB (ref 65–99)
GLUCOSE-CAPILLARY: 156 mg/dL — AB (ref 65–99)
GLUCOSE-CAPILLARY: 199 mg/dL — AB (ref 65–99)
GLUCOSE-CAPILLARY: 108 mg/dL — AB (ref 65–99)
Glucose-Capillary: 159 mg/dL — ABNORMAL HIGH (ref 65–99)
Glucose-Capillary: 168 mg/dL — ABNORMAL HIGH (ref 65–99)

## 2017-08-31 LAB — BASIC METABOLIC PANEL
Anion gap: 8 (ref 5–15)
BUN: 9 mg/dL (ref 6–20)
CO2: 24 mmol/L (ref 22–32)
Calcium: 8.3 mg/dL — ABNORMAL LOW (ref 8.9–10.3)
Chloride: 105 mmol/L (ref 101–111)
Creatinine, Ser: 0.88 mg/dL (ref 0.44–1.00)
GFR calc Af Amer: >60 mL/min (ref >60)
GFR calc non Af Amer: >60 mL/min (ref >60)
Glucose, Bld: 184 mg/dL — ABNORMAL HIGH (ref 65–99)
Potassium: 3.9 mmol/L (ref 3.5–5.1)
Sodium: 137 mmol/L (ref 135–145)

## 2017-08-31 LAB — POCT I-STAT 3, ART BLOOD GAS (G3+)
Acid-Base Excess: 1 mmol/L (ref 0.0–2.0)
Acid-base deficit: 2 mmol/L (ref 0.0–2.0)
Bicarbonate: 23.3 mmol/L (ref 20.0–28.0)
Bicarbonate: 24.6 mmol/L (ref 20.0–28.0)
O2 SAT: 96 %
O2 Saturation: 95 %
PCO2 ART: 43.6 mmHg (ref 32.0–48.0)
PCO2 ART: 35 mmHg (ref 32.0–48.0)
PH ART: 7.336 — AB (ref 7.350–7.450)
PH ART: 7.452 — AB (ref 7.350–7.450)
PO2 ART: 79 mmHg — AB (ref 83.0–108.0)
PO2 ART: 78 mmHg — AB (ref 83.0–108.0)
Patient temperature: 97.9
TCO2: 25 mmol/L (ref 22–32)
TCO2: 26 mmol/L (ref 22–32)

## 2017-08-31 LAB — CBC
HCT: 26.8 % — ABNORMAL LOW (ref 36.0–46.0)
Hemoglobin: 8.7 g/dL — ABNORMAL LOW (ref 12.0–15.0)
MCH: 28.9 pg (ref 26.0–34.0)
MCHC: 32.5 g/dL (ref 30.0–36.0)
MCV: 89 fL (ref 78.0–100.0)
Platelets: 165 10*3/uL (ref 150–400)
RBC: 3.01 MIL/uL — ABNORMAL LOW (ref 3.87–5.11)
RDW: 15.4 % (ref 11.5–15.5)
WBC: 23.8 10*3/uL — ABNORMAL HIGH (ref 4.0–10.5)

## 2017-08-31 MED ORDER — FUROSEMIDE 10 MG/ML IJ SOLN
INTRAMUSCULAR | Status: AC
  Administered 2017-08-31: 40 mg
  Filled 2017-08-31: qty 4

## 2017-08-31 MED ORDER — ENOXAPARIN SODIUM 30 MG/0.3ML ~~LOC~~ SOLN: 30.0000 mg | SUBCUTANEOUS | Status: DC

## 2017-08-31 MED ORDER — MORPHINE SULFATE (PF) 4 MG/ML IV SOLN
INTRAVENOUS | Status: AC
  Filled 2017-08-31: qty 1

## 2017-08-31 MED ORDER — FUROSEMIDE 10 MG/ML IJ SOLN
40.0000 mg | Freq: Once | INTRAMUSCULAR | Status: AC
  Administered 2017-08-31: 40 mg via INTRAVENOUS
  Filled 2017-08-31: qty 4

## 2017-08-31 MED ORDER — GABAPENTIN 300 MG PO CAPS
300.0000 mg | ORAL_CAPSULE | Freq: Two times a day (BID) | ORAL | Status: DC
  Administered 2017-08-31 – 2017-09-05 (×10): 300 mg via ORAL
  Filled 2017-08-31 (×10): qty 1

## 2017-08-31 MED ORDER — SILVER SULFADIAZINE 1 % EX CREA
TOPICAL_CREAM | Freq: Two times a day (BID) | CUTANEOUS | Status: DC
  Filled 2017-08-31 (×2): qty 85

## 2017-08-31 MED ORDER — FUROSEMIDE 10 MG/ML IJ SOLN
40.0000 mg | Freq: Once | INTRAMUSCULAR | Status: AC
  Administered 2017-08-31: 40 mg via INTRAVENOUS

## 2017-08-31 NOTE — Progress Notes (Signed)
Patient ID: Norma [name], female   DOB: [birth year], 67 y.o.   MRN: [id number] TCTS DAILY ICU PROGRESS NOTE                   [address]            [place] [address]          [phone]   2 Days Post-Op Procedure(s) (LRB): CORONARY ARTERY BYPASS GRAFTING (CABG) x 3 WITH ENDOSCOPIC HARVESTING OF RIGHT SAPHENOUS VEIN (N/A) TRANSESOPHAGEAL ECHOCARDIOGRAM (TEE) (N/A)  Total Length of Stay:  LOS: 4 days   Subjective: Somewhat somnolent this morning after IV fentanyl and 900 mg of gabapentin 3 times yesterday Patient is neurologically intact  Objective: Vital signs in last 24 hours: Temp:  [97.7 F (36.5 C)-100.2 F (37.9 C)] 100 F (37.8 C) (05/19 0900) Pulse Rate:  [28-106] 86 (05/19 0900) Cardiac Rhythm: Normal sinus rhythm (05/19 0830) Resp:  [14-30] 22 (05/19 0900) BP: (100-150)/(44-69) 144/56 (05/19 0858) SpO2:  [73 %-100 %] 96 % (05/19 0900) FiO2 (%):  [40 %] 40 % (05/18 1029) Weight:  [275 lb 5.7 oz (124.9 kg)] 275 lb 5.7 oz (124.9 kg) (05/19 0130)  Filed Weights   08/29/17 1600 08/30/17 0500 08/31/17 0130  Weight: 258 lb 6.1 oz (117.2 kg) 277 lb 12.5 oz (126 kg) 275 lb 5.7 oz (124.9 kg)    Weight change: 16 lb 15.6 oz (7.7 kg)   Hemodynamic parameters for last 24 hours: PAP: (34-57)/(15-31) 42/19 CO:  [7.1 L/min-9.7 L/min] 7.4 L/min CI:  [3.3 L/min/m2-4.5 L/min/m2] 3.4 L/min/m2  Intake/Output from previous day: 05/18 0701 - 05/19 0700 In: 2542.2 [I.V.:1392.2; IV Piggyback:1150] Out: 3195 [Urine:2830; Chest Tube:365]  Intake/Output this shift: No intake/output data recorded.  Current Meds: Scheduled Meds: . acetaminophen  1,000 mg Oral Q6H   Or  . acetaminophen (TYLENOL) oral liquid 160 mg/5 mL  1,000 mg Per Tube Q6H  . aspirin EC  325 mg Oral Daily   Or  . aspirin  324 mg Per Tube Daily  . baclofen  10 mg Oral QHS  . bisacodyl  10 mg Oral Daily   Or  . bisacodyl  10 mg Rectal Daily  . buPROPion  300 mg Oral q morning - 10a  .  chlorhexidine gluconate (MEDLINE KIT)  15 mL Mouth Rinse BID  . Chlorhexidine Gluconate Cloth  6 each Topical Daily  . docusate sodium  200 mg Oral Daily  . enoxaparin (LOVENOX) injection  30 mg Subcutaneous QHS  . gabapentin  900 mg Oral BID  . insulin aspart  0-24 Units Subcutaneous Q4H  . insulin detemir  20 Units Subcutaneous Daily  . insulin detemir  20 Units Subcutaneous Daily  . insulin regular  0-10 Units Intravenous TID WC  . lamoTRIgine  150 mg Oral QHS  . levalbuterol  1.25 mg Nebulization Q6H  . levothyroxine  100 mcg Oral QAC breakfast  . mouth rinse  15 mL Mouth Rinse QID  . metoprolol tartrate  12.5 mg Oral BID   Or  . metoprolol tartrate  12.5 mg Per Tube BID  . pantoprazole  40 mg Oral Daily  . rOPINIRole  2 mg Oral Daily   And  . rOPINIRole  4 mg Oral QHS  . sodium chloride flush  10-40 mL Intracatheter Q12H  . sodium chloride flush  3 mL Intravenous Q12H  . traZODone  50 mg Oral QHS  . venlafaxine XR  300 mg Oral QHS  Continuous Infusions: . sodium chloride 10 mL/hr at 08/31/17 0700  . sodium chloride 250 mL (08/30/17 0517)  . sodium chloride 10 mL/hr at 08/31/17 0700  . dexmedetomidine (PRECEDEX) IV infusion Stopped (08/30/17 1119)  . DOPamine Stopped (08/30/17 2300)  . insulin (NOVOLIN-R) infusion Stopped (08/30/17 1600)  . lactated ringers 10 mL/hr at 08/31/17 0700  . lactated ringers 20 mL/hr (08/31/17 0600)  . levofloxacin (LEVAQUIN) IV 750 mg (08/31/17 0853)  . milrinone 0.125 mcg/kg/min (08/31/17 0917)  . nitroGLYCERIN Stopped (08/29/17 1538)  . norepinephrine (LEVOPHED) Adult infusion Stopped (08/30/17 1404)  . phenylephrine (NEO-SYNEPHRINE) Adult infusion Stopped (08/30/17 1047)   PRN Meds:.sodium chloride, fentaNYL (SUBLIMAZE) injection, metoprolol tartrate, midazolam, ondansetron (ZOFRAN) IV, sodium chloride flush, sodium chloride flush, traMADol  General appearance: cooperative, appears older than stated age, no distress and slowed  mentation Neurologic: intact Heart: regular rate and rhythm, S1, S2 normal, no murmur, click, rub or gallop Lungs: diminished breath sounds bilaterally Abdomen: soft, non-tender; bowel sounds normal; no masses,  no organomegaly Extremities: edema Bilateral lower extremity edema, A-line in the right groin Wound: Sternum is stable  Lab Results: CBC: Recent Labs    08/30/17 1705 08/30/17 1715 08/31/17 0406  WBC 25.9*  --  23.8*  HGB 9.1* 10.2* 8.7*  HCT 27.8* 30.0* 26.8*  PLT 205  --  165   BMET:  Recent Labs    08/30/17 0444  08/30/17 1715 08/31/17 0406  NA 139  --  138 137  K 4.2  --  4.1 3.9  CL 109  --  105 105  CO2 24  --   --  24  GLUCOSE 110*  --  141* 184*  BUN 10  --  7 9  CREATININE 0.77   < > 0.60 0.88  CALCIUM 8.1*  --   --  8.3*   < > = values in this interval not displayed.    CMET: Lab Results  Component Value Date   WBC 23.8 (H) 08/31/2017   HGB 8.7 (L) 08/31/2017   HCT 26.8 (L) 08/31/2017   PLT 165 08/31/2017   GLUCOSE 184 (H) 08/31/2017   CHOL 161 07/10/2016   TRIG 153 (H) 07/10/2016   HDL 47 (L) 07/10/2016   LDLCALC 83 07/10/2016   ALT 14 08/28/2017   AST 16 08/28/2017   NA 137 08/31/2017   K 3.9 08/31/2017   CL 105 08/31/2017   CREATININE 0.88 08/31/2017   BUN 9 08/31/2017   CO2 24 08/31/2017   TSH 3.71 07/11/2017   INR 1.23 08/29/2017   HGBA1C 7.2 (H) 08/28/2017   MICROALBUR 1.5 07/10/2016      PT/INR:  Recent Labs    08/29/17 1503  LABPROT 15.4*  INR 1.23   Radiology: No results found.   Assessment/Plan: S/P Procedure(s) (LRB): CORONARY ARTERY BYPASS GRAFTING (CABG) x 3 WITH ENDOSCOPIC HARVESTING OF RIGHT SAPHENOUS VEIN (N/A) TRANSESOPHAGEAL ECHOCARDIOGRAM (TEE) (N/A) Mobilize Diuresis Diabetes control d/c tubes/lines DC the A-line Decrease gabapentin dose secondary to sedation Mobilize patient Wean milrinone off, COOX [place] 08/31/2017 9:17 AM

## 2017-08-31 NOTE — Progress Notes (Signed)
Patient ID: Norma [name], female   DOB: 09-04-1950, 67 y.o.   MRN: [id number] EVENING ROUNDS NOTE :     [address]       [place] [address]             [phone]                 2 Days Post-Op Procedure(s) (LRB): CORONARY ARTERY BYPASS GRAFTING (CABG) x 3 WITH ENDOSCOPIC HARVESTING OF RIGHT SAPHENOUS VEIN (N/A) TRANSESOPHAGEAL ECHOCARDIOGRAM (TEE) (N/A)  Total Length of Stay:  LOS: 4 days  BP (!) 128/48   Pulse 80   Temp 97.9 F (36.6 C) (Oral)   Resp (!) 23   Ht 5\' 4"  (1.626 m)   Wt 275 lb 5.7 oz (124.9 kg)   SpO2 98%   BMI 47.26 kg/m   .Intake/Output      05/19 0701 - 05/20 0700   P.O. 120   I.V. (mL/kg) 45.2 (0.4)   IV Piggyback    Total Intake(mL/kg) 165.2 (1.3)   Urine (mL/kg/hr) 825 (0.5)   Chest Tube 50   Total Output 875   Net -709.8         . norepinephrine (LEVOPHED) Adult infusion Stopped (08/30/17 1404)     Lab Results  Component Value Date   WBC 23.8 (H) 08/31/2017   HGB 8.7 (L) 08/31/2017   HCT 26.8 (L) 08/31/2017   PLT 165 08/31/2017   GLUCOSE 184 (H) 08/31/2017   CHOL 161 07/10/2016   TRIG 153 (H) 07/10/2016   HDL 47 (L) 07/10/2016   LDLCALC 83 07/10/2016   ALT 14 08/28/2017   AST 16 08/28/2017   NA 137 08/31/2017   K 3.9 08/31/2017   CL 105 08/31/2017   CREATININE 0.88 08/31/2017   BUN 9 08/31/2017   CO2 24 08/31/2017   TSH 3.71 07/11/2017   INR 1.23 08/29/2017   HGBA1C 7.2 (H) 08/28/2017   MICROALBUR 1.5 07/10/2016   More alert this pm  Resume Neurontin at decreased dose preop pressure ulcers on anterior panniculus - treat with silvadene  Pt ot consults  All drips off    Grace Isaac MD  Beeper [phone] Office [phone] 08/31/2017 7:18 PM

## 2017-09-01 ENCOUNTER — Encounter (HOSPITAL_COMMUNITY): Payer: Self-pay | Admitting: Cardiothoracic Surgery

## 2017-09-01 LAB — GLUCOSE, CAPILLARY
GLUCOSE-CAPILLARY: 135 mg/dL — AB (ref 65–99)
GLUCOSE-CAPILLARY: 141 mg/dL — AB (ref 65–99)
Glucose-Capillary: 143 mg/dL — ABNORMAL HIGH (ref 65–99)
Glucose-Capillary: 145 mg/dL — ABNORMAL HIGH (ref 65–99)
Glucose-Capillary: 148 mg/dL — ABNORMAL HIGH (ref 65–99)
Glucose-Capillary: 152 mg/dL — ABNORMAL HIGH (ref 65–99)
Glucose-Capillary: 122 mg/dL — ABNORMAL HIGH (ref 65–99)

## 2017-09-01 LAB — POCT I-STAT, CHEM 8
BUN: 14 mg/dL (ref 6–20)
CHLORIDE: 100 mmol/L — AB (ref 101–111)
CREATININE: 0.7 mg/dL (ref 0.44–1.00)
Calcium, Ion: 1.17 mmol/L (ref 1.15–1.40)
GLUCOSE: 162 mg/dL — AB (ref 65–99)
HCT: 27 % — ABNORMAL LOW (ref 36.0–46.0)
Hemoglobin: 9.2 g/dL — ABNORMAL LOW (ref 12.0–15.0)
Potassium: 3.5 mmol/L (ref 3.5–5.1)
Sodium: 140 mmol/L (ref 135–145)
TCO2: 27 mmol/L (ref 22–32)

## 2017-09-01 LAB — BASIC METABOLIC PANEL
Anion gap: 9 (ref 5–15)
BUN: 15 mg/dL (ref 6–20)
CO2: 26 mmol/L (ref 22–32)
Calcium: 8.2 mg/dL — ABNORMAL LOW (ref 8.9–10.3)
Chloride: 102 mmol/L (ref 101–111)
Creatinine, Ser: 0.94 mg/dL (ref 0.44–1.00)
GFR calc Af Amer: >60 mL/min (ref >60)
GFR calc non Af Amer: >60 mL/min (ref >60)
Glucose, Bld: 164 mg/dL — ABNORMAL HIGH (ref 65–99)
Potassium: 3.5 mmol/L (ref 3.5–5.1)
Sodium: 137 mmol/L (ref 135–145)

## 2017-09-01 LAB — BPAM RBC
Blood Product Expiration Date: 201906052359
Blood Product Expiration Date: 201906052359
Blood Product Expiration Date: 201906062359
Blood Product Expiration Date: 201906092359
Blood Product Expiration Date: 201906092359
Blood Product Expiration Date: 201906102359
ISSUE DATE / TIME: 201905170933
ISSUE DATE / TIME: 201905170933
ISSUE DATE / TIME: 201905171216
ISSUE DATE / TIME: 201905171216
Unit Type and Rh: 600
Unit Type and Rh: 600
Unit Type and Rh: 600
Unit Type and Rh: 600
Unit Type and Rh: 600
Unit Type and Rh: 600

## 2017-09-01 LAB — COOXEMETRY PANEL
Carboxyhemoglobin: 1.6 % — ABNORMAL HIGH (ref 0.5–1.5)
Methemoglobin: 1 % (ref 0.0–1.5)
O2 Saturation: 61.3 %
Total hemoglobin: 8.4 g/dL — ABNORMAL LOW (ref 12.0–16.0)

## 2017-09-01 LAB — CBC
HCT: 28.2 % — ABNORMAL LOW (ref 36.0–46.0)
Hemoglobin: 9 g/dL — ABNORMAL LOW (ref 12.0–15.0)
MCH: 28.6 pg (ref 26.0–34.0)
MCHC: 31.9 g/dL (ref 30.0–36.0)
MCV: 89.5 fL (ref 78.0–100.0)
Platelets: 199 10*3/uL (ref 150–400)
RBC: 3.15 MIL/uL — ABNORMAL LOW (ref 3.87–5.11)
RDW: 15.3 % (ref 11.5–15.5)
WBC: 21.3 10*3/uL — ABNORMAL HIGH (ref 4.0–10.5)

## 2017-09-01 LAB — TYPE AND SCREEN
ABO/RH(D): AB NEG
Antibody Screen: NEGATIVE
Unit division: 0
Unit division: 0
Unit division: 0
Unit division: 0
Unit division: 0
Unit division: 0

## 2017-09-01 MED ORDER — MUPIROCIN CALCIUM 2 % EX CREA
TOPICAL_CREAM | Freq: Two times a day (BID) | CUTANEOUS | Status: DC
  Administered 2017-09-01 – 2017-09-04 (×5): via TOPICAL
  Administered 2017-09-04: 1 via TOPICAL
  Administered 2017-09-05: 11:00:00 via TOPICAL
  Filled 2017-09-01: qty 15

## 2017-09-01 MED ORDER — POTASSIUM CHLORIDE CRYS ER 20 MEQ PO TBCR: 20.0000 meq | EXTENDED_RELEASE_TABLET | ORAL | Status: DC | PRN

## 2017-09-01 MED ORDER — LEVOFLOXACIN 500 MG PO TABS
500.0000 mg | ORAL_TABLET | Freq: Every day | ORAL | Status: DC
  Administered 2017-09-01: 500 mg via ORAL
  Filled 2017-09-01 (×3): qty 1

## 2017-09-01 MED ORDER — INSULIN ASPART 100 UNIT/ML ~~LOC~~ SOLN
0.0000 [IU] | Freq: Three times a day (TID) | SUBCUTANEOUS | Status: DC
  Administered 2017-09-01: 4 [IU] via SUBCUTANEOUS
  Administered 2017-09-01 (×2): 3 [IU] via SUBCUTANEOUS
  Administered 2017-09-02 (×3): 4 [IU] via SUBCUTANEOUS
  Administered 2017-09-03 – 2017-09-04 (×5): 3 [IU] via SUBCUTANEOUS
  Administered 2017-09-05: 4 [IU] via SUBCUTANEOUS

## 2017-09-01 MED ORDER — POTASSIUM CHLORIDE CRYS ER 20 MEQ PO TBCR
20.0000 meq | EXTENDED_RELEASE_TABLET | Freq: Two times a day (BID) | ORAL | Status: DC
  Administered 2017-09-01 (×2): 20 meq via ORAL
  Filled 2017-09-01 (×2): qty 1

## 2017-09-01 MED ORDER — MUPIROCIN 2 % EX OINT
TOPICAL_OINTMENT | Freq: Two times a day (BID) | CUTANEOUS | Status: DC
  Administered 2017-09-01 – 2017-09-02 (×3): via NASAL
  Administered 2017-09-02: 1 via NASAL
  Administered 2017-09-03 (×2): via NASAL
  Filled 2017-09-01: qty 22

## 2017-09-01 MED ORDER — INSULIN ASPART 100 UNIT/ML ~~LOC~~ SOLN
0.0000 [IU] | Freq: Every day | SUBCUTANEOUS | Status: DC
  Administered 2017-09-03: 2 [IU] via SUBCUTANEOUS

## 2017-09-01 MED ORDER — AMIODARONE HCL 200 MG PO TABS
400.0000 mg | ORAL_TABLET | Freq: Two times a day (BID) | ORAL | Status: DC
  Administered 2017-09-01 – 2017-09-02 (×4): 400 mg via ORAL
  Filled 2017-09-01 (×4): qty 2

## 2017-09-01 MED ORDER — POTASSIUM CHLORIDE CRYS ER 20 MEQ PO TBCR
20.0000 meq | EXTENDED_RELEASE_TABLET | ORAL | Status: AC
  Administered 2017-09-01 (×3): 20 meq via ORAL
  Filled 2017-09-01 (×3): qty 1

## 2017-09-01 MED ORDER — FUROSEMIDE 10 MG/ML IJ SOLN
40.0000 mg | Freq: Two times a day (BID) | INTRAMUSCULAR | Status: DC
  Administered 2017-09-01 – 2017-09-02 (×4): 40 mg via INTRAVENOUS
  Filled 2017-09-01 (×4): qty 4

## 2017-09-01 MED ORDER — POTASSIUM CHLORIDE 10 MEQ/50ML IV SOLN
10.0000 meq | INTRAVENOUS | Status: AC
  Administered 2017-09-01 (×2): 10 meq via INTRAVENOUS
  Filled 2017-09-01 (×2): qty 50

## 2017-09-01 NOTE — Care Management Note (Signed)
Case Management Note Marvetta Gibbons RN,BSN [facility] 1-22 Case Manager  [phone]  Patient Details  Name: Norma [name] MRN: [id number] Date of Birth: Aug 15, 1950  Subjective/Objective:   Pt admitted with [place], MVD- s/p CABGx3 on 5/17                 Action/Plan: PTA Pt lived at home alone, per PT eval recommendation for SNF- CSW has been consulted for placement needs.   Expected Discharge Date:                  Expected Discharge Plan:  Skilled Nursing Facility  In-House Referral:  Clinical Social Work  Discharge planning Services  CM Consult  Post Acute Care Choice:    Choice offered to:     DME Arranged:    DME Agency:     HH Arranged:    [place] Agency:     Status of Service:  In process, will continue to follow  If discussed at Long Length of Stay Meetings, dates discussed:  5/21   Discharge Disposition:   Additional Comments:  Dawayne Patricia, RN 09/01/2017, 11:38 AM

## 2017-09-01 NOTE — Consult Note (Signed)
[facility] Nurse wound consult note Reason for Consult: Consult requested for abd wounds.  Pt states these are chronic and nonhealing after a previous surgery, and her physician has ordered Bactroban and nonadherent dressings BID. Wound type: 2 full thickness wounds; 2X2X.2cm and .5X2X.2cm, red and moist Drainage (amount, consistency, odor) mod amt tan drainage, no odor Periwound: intact skin surrounding Dressing procedure/placement/frequency: Continue present plan of care with Bactroban and Telfa.  Discussed plan of care; Pt can resume followup with her previous physician after discharge. Please re-consult if further assistance is needed.  Thank-you,  Julien Girt MSN, [place], [place], [place], [place]

## 2017-09-01 NOTE — NC FL2 (Signed)
[place] LEVEL OF CARE SCREENING TOOL     IDENTIFICATION  Patient Name: Norma [name] Birthdate: 08/07/1950 Sex: female Admission Date (Current Location): 08/27/2017  [facility] and [place] Number:  [employer] and Address:  The . [facility], [place] [address] [place], [place] [address]      Provider Number: [id number]  Attending Physician Name and Address:  Ivin Poot, MD  Relative Name and Phone Number:       Current Level of Care: Hospital Recommended Level of Care: [place] Prior Approval Number:    Date Approved/Denied:   PASRR Number:    Discharge Plan: SNF    Current Diagnoses: Patient Active Problem List   Diagnosis Date Noted  . S/P CABG x 3 08/29/2017  . Coronary artery disease involving native coronary artery of native heart with unstable angina pectoris ([place]) 08/28/2017  . Unstable angina ([place]) 08/27/2017  . Cardiomyopathy ([place]) 08/22/2017  . Stroke (cerebrum) ([place]) 05/16/2017  . Low hemoglobin 09/19/2016  . Dizziness 06/21/2016  . Chest pain with moderate risk for cardiac etiology 04/23/2016  . Bipolar I disorder, most recent episode depressed ([place]) 06/05/2015  . Insulin dependent diabetes mellitus ([place]) 09/20/2014  . Chronic low back pain 09/20/2014  . Morbid obesity ([place]) 09/20/2014  . Memory difficulty 09/20/2014  . Lumbosacral spondylosis without myelopathy 12/25/2012  . Gastroparesis 11/04/2012  . Nausea alone 11/04/2012  . Restless legs syndrome (RLS) 09/17/2012  . Headache(784.0) 03/19/2012  . GERD (gastroesophageal reflux disease) 02/16/2012  . Abdominal pain 02/16/2012  . Hypothyroidism 02/16/2012  . Tremor 02/16/2012  . Hyperlipidemia associated with type 2 diabetes mellitus ([place]) 04/01/2008  . Obstructive sleep apnea 04/01/2008  . Essential hypertension 04/01/2008  . LBBB (left bundle branch block) 04/01/2008    Orientation RESPIRATION BLADDER Height & Weight      Self, Time, Situation, Place  O2(2L [place]) Incontinent, Indwelling catheter Weight: 265 lb 10.5 oz (120.5 kg) Height:  5' 4"  (162.6 cm)  BEHAVIORAL SYMPTOMS/MOOD NEUROLOGICAL BOWEL NUTRITION STATUS      Continent Diet(see DC summary)  AMBULATORY STATUS COMMUNICATION OF NEEDS Skin   Limited Assist Verbally Surgical wounds                       Personal Care Assistance Level of Assistance  Bathing, Dressing Bathing Assistance: Limited assistance   Dressing Assistance: Limited assistance     Functional Limitations Info             SPECIAL CARE FACTORS FREQUENCY  PT (By licensed PT), OT (By licensed OT)     PT Frequency: 5/wk OT Frequency: 5/wk            Contractures      Additional Factors Info  Code Status, Allergies Code Status Info: FULL Allergies Info: Amoxicillin, Hydrocodone, Depacon Valproic Acid           Current Medications (09/01/2017):  This is the current hospital active medication list Current Facility-Administered Medications  Medication Dose Route Frequency Provider Last Rate Last Dose  . acetaminophen (TYLENOL) tablet 1,000 mg  1,000 mg Oral Q6H Conte, Tessa N, PA-C   1,000 mg at 09/01/17 5093   Or  . acetaminophen (TYLENOL) solution 1,000 mg  1,000 mg Per Tube Q6H Conte, Tessa N, PA-C   1,000 mg at 08/31/17 1802  . amiodarone (PACERONE) tablet 400 mg  400 mg Oral BID Prescott Gum, Collier Salina, MD   400 mg at 09/01/17 0949  .  aspirin EC tablet 325 mg  325 mg Oral Daily Elgie Collard, [place]   325 mg at 09/01/17 0945   Or  . aspirin chewable tablet 324 mg  324 mg Per Tube Daily Conte, Tessa N, PA-C      . bisacodyl (DULCOLAX) EC tablet 10 mg  10 mg Oral Daily Nicholes Rough N, [place]   10 mg at 09/01/17 0569   Or  . bisacodyl (DULCOLAX) suppository 10 mg  10 mg Rectal Daily Conte, Tessa N, PA-C      . buPROPion (WELLBUTRIN XL) 24 hr tablet 300 mg  300 mg Oral q morning - 10a Conte, Tessa N, PA-C   300 mg at 09/01/17 1002  . chlorhexidine gluconate (MEDLINE  KIT) (PERIDEX) 0.12 % solution 15 mL  15 mL Mouth Rinse BID Ivin Poot, MD   15 mL at 08/31/17 2238  . Chlorhexidine Gluconate Cloth 2 % PADS 6 each  6 each Topical Daily Ivin Poot, MD   6 each at 08/31/17 [phone]  . docusate sodium (COLACE) capsule 200 mg  200 mg Oral Daily Nicholes Rough N, PA-C   200 mg at 09/01/17 0945  . enoxaparin (LOVENOX) injection 30 mg  30 mg Subcutaneous QHS Grace Isaac, MD   30 mg at 08/31/17 2224  . furosemide (LASIX) injection 40 mg  40 mg Intravenous BID Ivin Poot, MD   40 mg at 09/01/17 [phone]  . gabapentin (NEURONTIN) capsule 300 mg  300 mg Oral BID Grace Isaac, MD   300 mg at 09/01/17 0951  . insulin aspart (novoLOG) injection 0-20 Units  0-20 Units Subcutaneous TID WC Ivin Poot, MD   3 Units at 09/01/17 [phone]  . insulin aspart (novoLOG) injection 0-5 Units  0-5 Units Subcutaneous QHS Prescott Gum, Peter, MD      . insulin detemir (LEVEMIR) injection 20 Units  20 Units Subcutaneous Daily Grace Isaac, MD   20 Units at 09/01/17 [phone]  . lamoTRIgine (LAMICTAL) tablet 150 mg  150 mg Oral QHS Nicholes Rough N, PA-C   150 mg at 08/31/17 2225  . levalbuterol (XOPENEX) nebulizer solution 1.25 mg  1.25 mg Nebulization Q6H Prescott Gum, Collier Salina, MD   1.25 mg at 09/01/17 0738  . levofloxacin (LEVAQUIN) tablet 500 mg  500 mg Oral Daily Prescott Gum, Collier Salina, MD      . levothyroxine (SYNTHROID, LEVOTHROID) tablet 100 mcg  100 mcg Oral QAC breakfast Elgie Collard, PA-C   100 mcg at 09/01/17 0950  . MEDLINE mouth rinse  15 mL Mouth Rinse QID Prescott Gum, Collier Salina, MD   15 mL at 09/01/17 0953  . metoprolol tartrate (LOPRESSOR) tablet 12.5 mg  12.5 mg Oral BID Nicholes Rough N, PA-C   12.5 mg at 09/01/17 7867   Or  . metoprolol tartrate (LOPRESSOR) 25 mg/10 mL oral suspension 12.5 mg  12.5 mg Per Tube BID Harriet Pho, Tessa N, PA-C      . metoprolol tartrate (LOPRESSOR) injection 2.5-5 mg  2.5-5 mg Intravenous Q2H PRN Elgie Collard, PA-C   5 mg at 08/31/17 0657  .  ondansetron (ZOFRAN) injection 4 mg  4 mg Intravenous Q6H PRN Elgie Collard, PA-C   4 mg at 08/31/17 2233  . pantoprazole (PROTONIX) EC tablet 40 mg  40 mg Oral Daily Elgie Collard, PA-C   40 mg at 09/01/17 0946  . potassium chloride SA (K-DUR,KLOR-CON) CR tablet 20 mEq  20 mEq Oral BID Ivin Poot, MD  20 mEq at 09/01/17 0949  . rOPINIRole (REQUIP) tablet 2 mg  2 mg Oral Daily Nicholes Rough N, PA-C   2 mg at 09/01/17 4650   And  . rOPINIRole (REQUIP) tablet 4 mg  4 mg Oral QHS Conte, Tessa N, PA-C   4 mg at 08/31/17 2223  . silver sulfADIAZINE (SILVADENE) 1 % cream   Topical BID Lanelle Bal B, MD      . sodium chloride flush (NS) 0.9 % injection 10-40 mL  10-40 mL Intracatheter Q12H Prescott Gum, Collier Salina, MD   10 mL at 09/01/17 0945  . sodium chloride flush (NS) 0.9 % injection 10-40 mL  10-40 mL Intracatheter PRN Prescott Gum, Collier Salina, MD      . sodium chloride flush (NS) 0.9 % injection 3 mL  3 mL Intravenous Q12H Conte, Tessa N, PA-C   3 mL at 09/01/17 0945  . sodium chloride flush (NS) 0.9 % injection 3 mL  3 mL Intravenous PRN Conte, Tessa N, PA-C      . traMADol Veatrice Bourbon) tablet 50-100 mg  50-100 mg Oral Q4H PRN Conte, Tessa N, PA-C      . venlafaxine XR (EFFEXOR-XR) 24 hr capsule 300 mg  300 mg Oral QHS Conte, Tessa N, PA-C   300 mg at 08/31/17 2225     Discharge Medications: Please see discharge summary for a list of discharge medications.  Relevant Imaging Results:  Relevant Lab Results:   Additional Information SS#: [id number]  Jorge Ny, LCSW

## 2017-09-01 NOTE — Evaluation (Signed)
Physical Therapy Evaluation Patient Details Name: Norma [name] MRN: [id number] DOB: [birth year] Today's Date: 09/01/2017   History of Present Illness  67 yo admitted with [place] s/p CABG x 3. PMHx: bipolar, DM, HTN, CAD, left parietal CVA Jan 2019, LBBB, AS, neuropathy  Clinical Impression  Pt alert and agreeable to participate with PT. Pt demonstrates decreased functional mobility and increased fall risk. Pt ambulates 20 ft with chair follow before requiring seated rest break with reported dizziness. Pt would benefit from acute PT in order to improve level of independence, functional mobility, and decrease risk of fall. Pt lives alone and is agreeable to D/C to SNF. Pt educated regarding sternal precautions but needs cueing for proper hand placement.  Vitals:  Heart Rate: 80 bpm rest, 100 bpm with activity SpO2: 99% on RA at rest, 94% on RA with activity BP: 116/53 at rest, 137/64 after activity    Follow Up Recommendations SNF;Supervision/Assistance - 24 hour    Equipment Recommendations  Rolling walker with 5" wheels    Recommendations for Other Services       Precautions / Restrictions Precautions Precautions: Fall;Sternal Precaution Booklet Issued: Yes (comment)      Mobility  Bed Mobility Overal bed mobility: Needs Assistance Bed Mobility: Sit to Supine       Sit to supine: Mod assist   General bed mobility comments: in chair on arrival. mod assist to control trunk and elevate legs to return to bed with cues for sequence and safety  Transfers Overall transfer level: Needs assistance   Transfers: Sit to/from Stand;Stand Pivot Transfers Sit to Stand: Min assist;Min guard Stand pivot transfers: Min guard       General transfer comment: pt min assist 1st 2 trials and minguard on 3rd stand with cues for hand placement  Ambulation/Gait Ambulation/Gait assistance: Min guard Ambulation Distance (Feet): 20 Feet Assistive device: Rolling walker (2 wheeled) Gait  Pattern/deviations: Decreased stride length;Wide base of support;Step-through pattern   Gait velocity interpretation: <1.31 ft/sec, indicative of household ambulator General Gait Details: cues for posture, looking up and position in RW with chair follow. Pt fatigues quickly with 2 trials of gait 20' each with seated rest between  Stairs            Wheelchair Mobility    Modified Rankin (Stroke Patients Only)       Balance Overall balance assessment: Needs assistance   Sitting balance-Leahy Scale: Good       Standing balance-Leahy Scale: Fair                               Pertinent Vitals/Pain Pain Assessment: No/denies pain    Home Living Family/patient expects to be discharged to:: Skilled nursing facility Living Arrangements: Alone   Type of Home: House Home Access: Stairs to enter Entrance Stairs-Rails: Right Entrance Stairs-Number of Steps: 2 Home Layout: Two level Home Equipment: Cane - single point      Prior Function Level of Independence: Independent               Hand Dominance        Extremity/Trunk Assessment                Communication   Communication: No difficulties  Cognition Arousal/Alertness: Awake/alert Behavior During Therapy: WFL for tasks assessed/performed Overall Cognitive Status: Within Functional Limits for tasks assessed  General Comments General comments (skin integrity, edema, etc.): 1 fall in the last year. 3 cats at home    Exercises     Assessment/Plan    PT Assessment Patient needs continued PT services  PT Problem List Decreased strength;Decreased mobility;Decreased activity tolerance;Decreased balance;Decreased knowledge of use of DME;Decreased skin integrity;Decreased knowledge of precautions       PT Treatment Interventions Gait training;Therapeutic exercise;Patient/family education;Stair training;Functional mobility  training;DME instruction;Therapeutic activities    PT Goals (Current goals can be found in the Care Plan section)  Acute Rehab PT Goals Patient Stated Goal: work for [facility] agency, watch Tv PT Goal Formulation: With patient Time For Goal Achievement: 09/15/17 Potential to Achieve Goals: Good    Frequency Min 3X/week   Barriers to discharge Decreased caregiver support      Co-evaluation               AM-PAC PT "6 Clicks" Daily Activity  Outcome Measure Difficulty turning over in bed (including adjusting bedclothes, sheets and blankets)?: Unable Difficulty moving from lying on back to sitting on the side of the bed? : Unable Difficulty sitting down on and standing up from a chair with arms (e.g., wheelchair, bedside commode, etc,.)?: Unable Help needed moving to and from a bed to chair (including a wheelchair)?: A Little Help needed walking in hospital room?: A Little Help needed climbing 3-5 steps with a railing? : A Lot 6 Click Score: 11    End of Session Equipment Utilized During Treatment: Gait belt Activity Tolerance: Patient tolerated treatment well Patient left: in bed;with call bell/phone within reach;with nursing/sitter in room;with bed alarm set Nurse Communication: Mobility status;Precautions PT Visit Diagnosis: Other abnormalities of gait and mobility (R26.89);Muscle weakness (generalized) (M62.81)    Time: 6546-5035 PT Time Calculation (min) (ACUTE ONLY): 31 min   Charges:   PT Evaluation $PT Eval Moderate Complexity: 1 Mod PT Treatments $Therapeutic Activity: 8-22 mins   PT G Codes:       [place] 09/01/2017, 9:14 AM

## 2017-09-01 NOTE — Progress Notes (Signed)
3 Days Post-Op Procedure(s) (LRB): CORONARY ARTERY BYPASS GRAFTING (CABG) x 3 WITH ENDOSCOPIC HARVESTING OF RIGHT SAPHENOUS VEIN (N/A) TRANSESOPHAGEAL ECHOCARDIOGRAM (TEE) (N/A) Subjective: Up in chair Runs of a-fib nedes asissit with walking  Objective: Vital signs in last 24 hours: Temp:  [97.8 F (36.6 C)-100 F (37.8 C)] 97.8 F (36.6 C) (05/20 0400) Pulse Rate:  [68-87] 76 (05/20 0740) Cardiac Rhythm: Normal sinus rhythm (05/19 1600) Resp:  [13-29] 17 (05/20 0740) BP: (96-144)/(39-73) 109/68 (05/20 0740) SpO2:  [93 %-100 %] 100 % (05/20 0740) Weight:  [265 lb 10.5 oz (120.5 kg)] 265 lb 10.5 oz (120.5 kg) (05/20 0700)  Hemodynamic parameters for last 24 hours: PAP: (42-47)/(19-28) 47/28  Intake/Output from previous day: 05/19 0701 - 05/20 0700 In: 523.2 [P.O.:478; I.V.:45.2] Out: 2056 [Urine:2005; Stool:1; Chest Tube:50] Intake/Output this shift: No intake/output data recorded.       Exam    General- alert and comfortable    Neck- no JVD, no cervical adenopathy palpable, no carotid bruit   Lungs- clear without rales, wheezes   Cor- regular rate and rhythm, no murmur , gallop   Abdomen- soft, non-tender   Extremities - warm, non-tender, minimal edema   Neuro- oriented, appropriate, no focal weakness   Lab Results: Recent Labs    08/31/17 0406 09/01/17 0427  WBC 23.8* 21.3*  HGB 8.7* 9.0*  HCT 26.8* 28.2*  PLT 165 199   BMET:  Recent Labs    08/31/17 0406 09/01/17 0427  NA 137 137  K 3.9 3.5  CL 105 102  CO2 24 26  GLUCOSE 184* 164*  BUN 9 15  CREATININE 0.88 0.94  CALCIUM 8.3* 8.2*    PT/INR:  Recent Labs    08/29/17 1503  LABPROT 15.4*  INR 1.23   ABG    Component Value Date/Time   PHART 7.452 (H) 08/31/2017 1752   HCO3 24.6 08/31/2017 1752   TCO2 26 08/31/2017 1752   ACIDBASEDEF 2.0 08/31/2017 0338   O2SAT 61.3 09/01/2017 0430   CBG (last 3)  Recent Labs    08/31/17 1202 08/31/17 1947 08/31/17 2319  GLUCAP 168* 199* 108*     Assessment/Plan: S/P Procedure(s) (LRB): CORONARY ARTERY BYPASS GRAFTING (CABG) x 3 WITH ENDOSCOPIC HARVESTING OF RIGHT SAPHENOUS VEIN (N/A) TRANSESOPHAGEAL ECHOCARDIOGRAM (TEE) (N/A) Mobilize Diuresis Diabetes control amio for postop a-fib   LOS: 5 days    Tharon Aquas Trigt III 09/01/2017

## 2017-09-01 NOTE — Progress Notes (Signed)
Patient diet order changed from full liquid regular diet to full liquid diabetic diet.  Also advised dietary that patient does not like milk.

## 2017-09-01 NOTE — Progress Notes (Signed)
Patient assisted back to bed with assistance of walker and staff x1.  Gait steady and slightly weak.  NAD noted.  Call bell in reach. Patient provided with sprite zero per request.

## 2017-09-01 NOTE — Clinical Social Work Note (Signed)
Clinical Social Work Assessment  Patient Details  Name: Norma [name] MRN: [id number] Date of Birth: 1950/06/17  Date of referral:  09/01/17               Reason for consult:  Facility Placement                Permission sought to share information with:  Facility Art therapist granted to share information::  Yes, Verbal Permission Granted  Name::        Agency::     Relationship::     Contact Information:     Housing/Transportation Living arrangements for the past 2 months:  [place] of Information:  Patient Patient Interpreter Needed:  None Criminal Activity/Legal Involvement Pertinent to Current Situation/Hospitalization:  No - Comment as needed Significant Relationships:  Parents, Siblings Lives with:  Self Do you feel safe going back to the place where you live?  No Need for family participation in patient care:  No (Coment)  Care giving concerns:  Pt lives at home alone and would not have 24 hour assistance available- MDs concerned about pt being home alone following CABG.   Social Worker assessment / plan:  CSW met with pt to discuss PT recommendation for SNF.  Explained SNF and SNF referral process.  Employment status:  Retired Nurse, adult PT Recommendations:  [place] / Referral to community resources:  [place]  Patient/Family's Response to care:  Pt understands recommendation for SNF due to lack of help at home and is agreeable to SNF search- hopeful for [facility] which would be close to home.  Patient/Family's Understanding of and Emotional Response to Diagnosis, Current Treatment, and Prognosis:  Pt seems to have good understanding of current condition and DC needs.  Hopeful she will improve quickly and be able to return home after a week or two.  Emotional Assessment Appearance:  Appears stated age Attitude/Demeanor/Rapport:    Affect  (typically observed):  Appropriate, Pleasant Orientation:  Oriented to Self, Oriented to Place, Oriented to  Time, Oriented to Situation Alcohol / Substance use:  Not Applicable Psych involvement (Current and /or in the community):  No (Comment)  Discharge Needs  Concerns to be addressed:  Care Coordination Readmission within the last 30 days:  Yes Current discharge risk:  Physical Impairment Barriers to Discharge:  Continued Medical Work up   Jorge Ny, LCSW 09/01/2017, 12:04 PM

## 2017-09-01 NOTE — Progress Notes (Signed)
Patient sitting up in recliner on RA.  NAD noted.  Dressing to right IJ introducer insertion site CDI.  No acute needs identified.  Call bell in reach.

## 2017-09-01 NOTE — Plan of Care (Signed)
Pt continues to progressing slowly.

## 2017-09-01 NOTE — Progress Notes (Signed)
      [address]       [place] [address]             [phone]      POD # 3 CABG x 3  Up in chair, no complaints  BP 125/64 (BP Location: Right Arm) Comment (BP Location): forearm  Pulse 79   Temp 98.5 F (36.9 C) (Oral)   Resp 17   Ht 5\' 4"  (1.626 m)   Wt 265 lb 10.5 oz (120.5 kg)   SpO2 95%   BMI 45.60 kg/m   Intake/Output Summary (Last 24 hours) at 09/01/2017 1810 Last data filed at 09/01/2017 1700 Gross per 24 hour  Intake 1060 ml  Output 2056 ml  Net -996 ml   Doing well this evening  Remo Lipps C. Roxan Hockey, MD [facility] [phone]'

## 2017-09-02 ENCOUNTER — Inpatient Hospital Stay (HOSPITAL_COMMUNITY): Payer: PPO

## 2017-09-02 DIAGNOSIS — E1169 Type 2 diabetes mellitus with other specified complication: Secondary | ICD-10-CM

## 2017-09-02 DIAGNOSIS — D62 Acute posthemorrhagic anemia: Secondary | ICD-10-CM

## 2017-09-02 DIAGNOSIS — Z8673 Personal history of transient ischemic attack (TIA), and cerebral infarction without residual deficits: Secondary | ICD-10-CM

## 2017-09-02 DIAGNOSIS — F319 Bipolar disorder, unspecified: Secondary | ICD-10-CM

## 2017-09-02 DIAGNOSIS — D72829 Elevated white blood cell count, unspecified: Secondary | ICD-10-CM

## 2017-09-02 DIAGNOSIS — G8918 Other acute postprocedural pain: Secondary | ICD-10-CM

## 2017-09-02 DIAGNOSIS — Z951 Presence of aortocoronary bypass graft: Secondary | ICD-10-CM

## 2017-09-02 DIAGNOSIS — G8929 Other chronic pain: Secondary | ICD-10-CM

## 2017-09-02 DIAGNOSIS — E876 Hypokalemia: Secondary | ICD-10-CM

## 2017-09-02 DIAGNOSIS — L8990 Pressure ulcer of unspecified site, unspecified stage: Secondary | ICD-10-CM

## 2017-09-02 DIAGNOSIS — I2510 Atherosclerotic heart disease of native coronary artery without angina pectoris: Secondary | ICD-10-CM

## 2017-09-02 DIAGNOSIS — M545 Low back pain: Secondary | ICD-10-CM

## 2017-09-02 DIAGNOSIS — E669 Obesity, unspecified: Secondary | ICD-10-CM

## 2017-09-02 LAB — POCT I-STAT, CHEM 8
BUN: 16 mg/dL (ref 6–20)
CALCIUM ION: 1.15 mmol/L (ref 1.15–1.40)
CHLORIDE: 98 mmol/L — AB (ref 101–111)
CREATININE: 0.8 mg/dL (ref 0.44–1.00)
GLUCOSE: 182 mg/dL — AB (ref 65–99)
HCT: 27 % — ABNORMAL LOW (ref 36.0–46.0)
Hemoglobin: 9.2 g/dL — ABNORMAL LOW (ref 12.0–15.0)
Potassium: 3.9 mmol/L (ref 3.5–5.1)
SODIUM: 139 mmol/L (ref 135–145)
TCO2: 26 mmol/L (ref 22–32)

## 2017-09-02 LAB — COMPREHENSIVE METABOLIC PANEL
ALT: 19 U/L (ref 14–54)
AST: 16 U/L (ref 15–41)
Albumin: 2.7 g/dL — ABNORMAL LOW (ref 3.5–5.0)
Alkaline Phosphatase: 59 U/L (ref 38–126)
Anion gap: 8 (ref 5–15)
BUN: 15 mg/dL (ref 6–20)
CO2: 28 mmol/L (ref 22–32)
Calcium: 8.3 mg/dL — ABNORMAL LOW (ref 8.9–10.3)
Chloride: 102 mmol/L (ref 101–111)
Creatinine, Ser: 0.86 mg/dL (ref 0.44–1.00)
GFR calc Af Amer: >60 mL/min (ref >60)
GFR calc non Af Amer: >60 mL/min (ref >60)
Glucose, Bld: 189 mg/dL — ABNORMAL HIGH (ref 65–99)
Potassium: 3.4 mmol/L — ABNORMAL LOW (ref 3.5–5.1)
Sodium: 138 mmol/L (ref 135–145)
Total Bilirubin: 0.9 mg/dL (ref 0.3–1.2)
Total Protein: 5.4 g/dL — ABNORMAL LOW (ref 6.5–8.1)

## 2017-09-02 LAB — CBC
HCT: 26.7 % — ABNORMAL LOW (ref 36.0–46.0)
Hemoglobin: 8.6 g/dL — ABNORMAL LOW (ref 12.0–15.0)
MCH: 29 pg (ref 26.0–34.0)
MCHC: 32.2 g/dL (ref 30.0–36.0)
MCV: 89.9 fL (ref 78.0–100.0)
Platelets: 258 10*3/uL (ref 150–400)
RBC: 2.97 MIL/uL — ABNORMAL LOW (ref 3.87–5.11)
RDW: 15.5 % (ref 11.5–15.5)
WBC: 17.6 10*3/uL — ABNORMAL HIGH (ref 4.0–10.5)

## 2017-09-02 LAB — GLUCOSE, CAPILLARY
GLUCOSE-CAPILLARY: 179 mg/dL — AB (ref 65–99)
Glucose-Capillary: 164 mg/dL — ABNORMAL HIGH (ref 65–99)

## 2017-09-02 MED ORDER — INSULIN DETEMIR 100 UNIT/ML ~~LOC~~ SOLN
24.0000 [IU] | Freq: Every day | SUBCUTANEOUS | Status: DC
  Administered 2017-09-02 – 2017-09-05 (×4): 24 [IU] via SUBCUTANEOUS
  Filled 2017-09-02 (×4): qty 0.24

## 2017-09-02 MED ORDER — CHLORHEXIDINE GLUCONATE 0.12 % MT SOLN
OROMUCOSAL | Status: AC
  Filled 2017-09-02: qty 15

## 2017-09-02 MED ORDER — POTASSIUM CHLORIDE CRYS ER 20 MEQ PO TBCR: 20.0000 meq | EXTENDED_RELEASE_TABLET | ORAL | Status: DC | PRN

## 2017-09-02 MED ORDER — POTASSIUM CHLORIDE CRYS ER 20 MEQ PO TBCR
40.0000 meq | EXTENDED_RELEASE_TABLET | Freq: Two times a day (BID) | ORAL | Status: DC
  Administered 2017-09-02 – 2017-09-05 (×7): 40 meq via ORAL
  Filled 2017-09-02 (×7): qty 2

## 2017-09-02 MED ORDER — LEVALBUTEROL HCL 1.25 MG/0.5ML IN NEBU
1.2500 mg | INHALATION_SOLUTION | Freq: Three times a day (TID) | RESPIRATORY_TRACT | Status: DC
  Administered 2017-09-02 – 2017-09-05 (×10): 1.25 mg via RESPIRATORY_TRACT
  Filled 2017-09-02 (×11): qty 0.5

## 2017-09-02 MED ORDER — LEVOTHYROXINE SODIUM 100 MCG PO TABS
100.0000 ug | ORAL_TABLET | Freq: Every day | ORAL | Status: DC
  Administered 2017-09-02 – 2017-09-05 (×4): 100 ug via ORAL
  Filled 2017-09-02 (×4): qty 1

## 2017-09-02 MED FILL — Potassium Chloride Inj 2 mEq/ML: INTRAVENOUS | Qty: 40 | Status: AC

## 2017-09-02 MED FILL — Magnesium Sulfate Inj 50%: INTRAMUSCULAR | Qty: 10 | Status: AC

## 2017-09-02 MED FILL — Heparin Sodium (Porcine) Inj 1000 Unit/ML: INTRAMUSCULAR | Qty: 30 | Status: AC

## 2017-09-02 MED FILL — Heparin Sodium (Porcine) Inj 1000 Unit/ML: INTRAMUSCULAR | Qty: 2500 | Status: AC

## 2017-09-02 NOTE — Progress Notes (Signed)
[facility] BRIEF SICU PROGRESS NOTE  4 Days Post-Op  S/P Procedure(s) (LRB): CORONARY ARTERY BYPASS GRAFTING (CABG) x 3 WITH ENDOSCOPIC HARVESTING OF RIGHT SAPHENOUS VEIN (N/A) TRANSESOPHAGEAL ECHOCARDIOGRAM (TEE) (N/A)   Stable day NSR w/ stable BP Breathing comfortably on room air Diuresing some  Plan: Continue current plan  Rexene Alberts, MD 09/02/2017 6:50 PM

## 2017-09-02 NOTE — Consult Note (Signed)
Physical Medicine and Rehabilitation Consult Reason for Consult: Decreased functional mobility Referring Physician: Dr. Lucianne Lei trite   HPI: Norma [name] is a 67 y.o. right-handed female with history of bipolar disorder, chronic low back pain, diabetes mellitus, chronic abdominal wound, morbid obesity as well as CVA 05/16/2017 maintained on aspirin.  Per chart review and patient, patient lives alone.  She still works as a Marine scientist.  Independent prior to admission.  She has a mother and father in the area who can assist but limited physically.  Presented 08/27/2017 with non-STEMI and associated chest pain with shortness of breath as well as fatigue and decreased exercise tolerance.  Cardiac enzymes are positive.  Echocardiogram with ejection fraction of 40% without valvular insufficiency.  Underwent cardiac catheterization showing severe multivessel CAD with high-grade stenosis at the LAD diagonal bifurcation.  Underwent CABG x3 08/29/2017 per Dr. Prescott Gum.  Hospital course pain management with sternal precautions.  Acute blood loss anemia 8.6 as well as monitoring of leukocytosis 17,600.  Follow-up wound care nurse for chronic and nonhealing abdominal wounds.  Physical therapy evaluation completed.  MD has requested physical medicine rehab consult.   Review of Systems  Constitutional: Negative for fever.  HENT: Negative for hearing loss.   Eyes: Negative for blurred vision and double vision.  Respiratory: Positive for shortness of breath.   Cardiovascular: Positive for leg swelling. Negative for chest pain.  Gastrointestinal: Positive for constipation. Negative for nausea and vomiting.       GERD  Genitourinary: Negative for dysuria, flank pain and hematuria.  Musculoskeletal: Positive for back pain.  Psychiatric/Behavioral:       Anxiety, bipolar disorder  All other systems reviewed and are negative.  Past Medical History:  Diagnosis Date  . Anemia   . Anxiety   . Bipolar disorder ([place])    . Bulging lumbar disc    L3-4  . Chronic daily headache   . Chronic low back pain 09/20/2014  . COPD (chronic obstructive pulmonary disease) ([place])   . Degenerative arthritis   . Depression   . Diabetes mellitus, type II ([place])   . DM type 2 with diabetic peripheral neuropathy ([place]) 09/20/2014  . Dyslipidemia   . Dyspnea   . Gastroparesis   . GERD (gastroesophageal reflux disease)   . Heart murmur   . History of hiatal hernia   . Hypothyroidism   . IBS (irritable bowel syndrome)   . Memory difficulty 09/20/2014  . Morbid obesity ([place])   . Neuropathy   . Obstructive sleep apnea on CPAP   . Restless legs syndrome (RLS) 09/17/2012  . Stroke (cerebrum) ([place]) 05/16/2017   Left parietal   Past Surgical History:  Procedure Laterality Date  . ABDOMINAL HYSTERECTOMY    . ARTHROSCOPY KNEE W/ DRILLING  06/2011   and Decemer of 2013.  [name] Carpel tunnel  1980's  . CATARACT EXTRACTION W/PHACO Right 03/21/2017   Procedure: CATARACT EXTRACTION PHACO AND INTRAOCULAR LENS PLACEMENT RIGHT EYE;  Surgeon: Baruch Goldmann, MD;  Location: [facility];  Service: Ophthalmology;  Laterality: Right;  CDE: 2.91   . CATARACT EXTRACTION W/PHACO Left 04/18/2017   Procedure: CATARACT EXTRACTION PHACO AND INTRAOCULAR LENS PLACEMENT LEFT EYE;  Surgeon: Baruch Goldmann, MD;  Location: [facility];  Service: Ophthalmology;  Laterality: Left;  left  . CHOLECYSTECTOMY    . COLONOSCOPY WITH PROPOFOL N/A 10/04/2016   Procedure: COLONOSCOPY WITH PROPOFOL;  Surgeon: Rogene Houston, MD;  Location: [facility];  Service: Endoscopy;  Laterality:  N/A;  11:10  . CORONARY ARTERY BYPASS GRAFT N/A 08/29/2017   Procedure: CORONARY ARTERY BYPASS GRAFTING (CABG) x 3 WITH ENDOSCOPIC HARVESTING OF RIGHT SAPHENOUS VEIN;  Surgeon: Ivin Poot, MD;  Location: [facility];  Service: Open Heart Surgery;  Laterality: N/A;  . ESOPHAGOGASTRODUODENOSCOPY N/A 04/25/2016   Procedure: ESOPHAGOGASTRODUODENOSCOPY (EGD);  Surgeon: Rogene Houston, MD;  [facility]: [facility];  Service: Endoscopy;  Laterality: N/A;  730  . LEFT HEART CATH AND CORONARY ANGIOGRAPHY N/A 08/27/2017   Procedure: LEFT HEART CATH AND CORONARY ANGIOGRAPHY;  Surgeon: Sherren Mocha, MD;  Location: [facility];  Service: Cardiovascular::  pLAD 95% - p-mLAD 50%, ostD1 90% -pD1 80%; ostOM1 90%; rPDA 80%. EF ~50-55% - HK of dital Anterolateral & Apical wall.  - Rec CVTS c/s  . RECTAL SURGERY     fissure  . SHOULDER SURGERY Left    arthroscopy in March of this year  . TEE WITHOUT CARDIOVERSION N/A 08/29/2017   Procedure: TRANSESOPHAGEAL ECHOCARDIOGRAM (TEE);  Surgeon: Prescott Gum, Collier Salina, MD;  Location: [facility];  Service: Open Heart Surgery;  Laterality: N/A;  . TRANSTHORACIC ECHOCARDIOGRAM  06/06/2017   Mild to moderate reduced EF 40 and 45%.  Anterior septal, inferoseptal and basal to mid inferior hypokinesis.  GR 1 DD.  No significant valvular lesion   Family History  Problem Relation Age of Onset  . Hypertension Mother   . Lymphoma Mother   . Depression Mother   . Arthritis Father   . Alcohol abuse Father   . Hypertension Sister   . Cancer Brother        kidney and lung  . Alcohol abuse Brother   . Alcohol abuse Paternal Uncle   . Alcohol abuse Paternal Grandfather   . Alcohol abuse Paternal Grandmother    Social History:  reports that she quit smoking about 46 years ago. Her smoking use included cigarettes. She has never used smokeless tobacco. She reports that she does not drink alcohol or use drugs. Allergies:  Allergies  Allergen Reactions  . Amoxicillin Anaphylaxis and Other (See Comments)    Has patient had a PCN reaction causing immediate rash, facial/tongue/throat swelling, SOB or lightheadedness with hypotension: Yes Has patient had a PCN reaction causing severe rash involving mucus membranes or skin necrosis: Yes Has patient had a PCN reaction that required hospitalization: Yes Has patient had a PCN reaction occurring within the last 10 years: Yes If all of  the above answers are "NO", then may proceed with Cephalosporin use.   [name] Hydrocodone Anaphylaxis  . Depacon [Valproic Acid] Other (See Comments)    Causes falls    Medications Prior to Admission  Medication Sig Dispense Refill  . aspirin EC 81 MG tablet Take 81 mg by mouth daily.    [name] aspirin-acetaminophen-caffeine (EXCEDRIN MIGRAINE) 250-250-65 MG tablet Take 2 tablets by mouth every 6 (six) hours as needed for headache.    . baclofen (LIORESAL) 10 MG tablet TAKE ONE TABLET BY MOUTH AT BEDTIME. 30 tablet 1  . buPROPion (WELLBUTRIN XL) 300 MG 24 hr tablet TAKE ONE TABLET BY MOUTH EVERY MORNING. 30 tablet 2  . Charcoal Activated (CHARCOAL PO) Take 2 capsules by mouth daily as needed (for gas).     . diclofenac sodium (VOLTAREN) 1 % GEL Apply 1 application topically daily as needed (for pain).     [name] esomeprazole (NEXIUM) 40 MG capsule Take 40 mg by mouth daily.   12  . furosemide (LASIX) 40 MG tablet  Take 40 mg by mouth daily as needed for fluid.   12  . gabapentin (NEURONTIN) 300 MG capsule Take 900 mg by mouth 2 (two) times daily.     . Insulin Glargine (TOUJEO SOLOSTAR) 300 UNIT/ML SOPN Inject 30 Units into the skin at bedtime. 13.5 mL 2  . ketoconazole (NIZORAL) 2 % cream Apply 1 application topically 2 (two) times daily as needed for irritation.   3  . lamoTRIgine (LAMICTAL) 150 MG tablet TAKE ONE TABLET BY MOUTH AT BEDTIME. 30 tablet 2  . levothyroxine (SYNTHROID, LEVOTHROID) 100 MCG tablet Take 100 mcg by mouth daily before breakfast.     . losartan (COZAAR) 50 MG tablet Take 50 mg by mouth at bedtime.     . meclizine (ANTIVERT) 25 MG tablet Take 1 tablet (25 mg total) by mouth 3 (three) times daily as needed for dizziness. (Patient taking differently: Take 25 mg by mouth daily as needed for dizziness. ) 30 tablet 0  . metFORMIN (GLUCOPHAGE) 500 MG tablet Take 2 tablets (1,000 mg total) by mouth 2 (two) times daily with a meal. 60 tablet 3  . oxymetazoline (AFRIN) 0.05 % nasal spray  Place 1 spray into both nostrils as needed for congestion.    . promethazine (PHENERGAN) 25 MG tablet Take 25 mg by mouth at bedtime as needed for nausea or vomiting.     [name] rOPINIRole (REQUIP) 2 MG tablet Take 2-4 mg by mouth See admin instructions. Takes 2 mg by mouth in the morning and 4 mg by mouth in the evening    . traMADol (ULTRAM) 50 MG tablet Take 2 tablets (100 mg total) by mouth every 8 (eight) hours as needed for pain. (Patient taking differently: Take 100 mg by mouth at bedtime as needed for moderate pain. ) 180 tablet 5  . traZODone (DESYREL) 50 MG tablet Take 50 mg by mouth at bedtime.  5  . venlafaxine XR (EFFEXOR-XR) 150 MG 24 hr capsule TAKE TWO (2) CAPSULES BY MOUTH AT BEDTIME. (Patient taking differently: TAKE 300 MG BY MOUTH AT BEDTIME) 60 capsule 2  . albuterol (PROVENTIL HFA;VENTOLIN HFA) 108 (90 Base) MCG/ACT inhaler Inhale 2 puffs into the lungs every 6 (six) hours as needed for wheezing or shortness of breath.    . EPINEPHrine (EPIPEN 2-PAK) 0.3 mg/0.3 mL IJ SOAJ injection Inject 0.3 mg into the muscle once.    . Insulin Pen Needle (LITE TOUCH PEN NEEDLES) 31G X 5 MM MISC USE ONE DAILY. (Patient not taking: Reported on 08/26/2017) 100 each 5  . Polyvinyl Alcohol-Povidone (REFRESH OP) Place 2 drops into both eyes daily as needed (for dry eyes).      Home: Home Living Family/patient expects to be discharged to:: Skilled nursing facility Living Arrangements: Alone Type of Home: House Home Access: Stairs to enter Entrance Stairs-Number of Steps: 2 Entrance Stairs-Rails: Right Home Layout: Two level [place]: [place] - single point  Functional History: Prior Function Level of Independence: Independent Functional Status:  Mobility: Bed Mobility Overal bed mobility: Needs Assistance Bed Mobility: Sit to Supine Sit to supine: Mod assist General bed mobility comments: in chair on arrival. mod assist to control trunk and elevate legs to return to bed with cues for  sequence and safety Transfers Overall transfer level: Needs assistance Transfers: Sit to/from Stand, Stand Pivot Transfers Sit to Stand: Min assist, Min guard Stand pivot transfers: Min guard General transfer comment: pt min assist 1st 2 trials and minguard on 3rd stand with cues for hand  placement Ambulation/Gait Ambulation/Gait assistance: Min guard Ambulation Distance (Feet): 20 Feet Assistive device: Rolling walker (2 wheeled) Gait Pattern/deviations: Decreased stride length, Wide base of support, Step-through pattern General Gait Details: cues for posture, looking up and position in RW with chair follow. Pt fatigues quickly with 2 trials of gait 20' each with seated rest between Gait velocity interpretation: <1.31 ft/sec, indicative of household ambulator    ADL:    Cognition: Cognition Overall Cognitive Status: Within Functional Limits for tasks assessed Orientation Level: Oriented X4 Cognition Arousal/Alertness: Awake/alert Behavior During Therapy: WFL for tasks assessed/performed Overall Cognitive Status: Within Functional Limits for tasks assessed  Blood pressure (!) 134/99, pulse 74, temperature 97.9 F (36.6 C), temperature source Oral, resp. rate (!) 23, height 5\' 4"  (1.626 m), weight 119.9 kg (264 lb 5.3 oz), SpO2 98 %. Physical Exam  Vitals reviewed. Constitutional: She is oriented to person, place, and time. She appears well-developed.  66 year old obese female  HENT:  Head: Normocephalic and atraumatic.  Eyes: EOM are normal. Right eye exhibits no discharge. Left eye exhibits no discharge.  Neck: Normal range of motion. Neck supple. No thyromegaly present.  Cardiovascular: Normal rate and regular rhythm.  Respiratory: Effort normal and breath sounds normal. No respiratory distress.  GI: Soft. Bowel sounds are normal. She exhibits no distension.  Musculoskeletal:  B/l LE edema  Neurological: She is alert and oriented to person, place, and time.  Motor: 4+/5  throughout  Skin:  Midline chest incision clean and dry  Psychiatric: She has a normal mood and affect. Her behavior is normal. Thought content normal.    Results for orders placed or performed during the hospital encounter of 08/27/17 (from the past 24 hour(s))  Glucose, capillary     Status: Abnormal   Collection Time: 09/01/17 12:14 PM  Result Value Ref Range   Glucose-Capillary 148 (H) 65 - 99 mg/dL   Comment 1 Notify RN   I-STAT, chem 8     Status: Abnormal   Collection Time: 09/01/17  3:22 PM  Result Value Ref Range   Sodium 140 135 - 145 mmol/L   Potassium 3.5 3.5 - 5.1 mmol/L   Chloride 100 (L) 101 - 111 mmol/L   BUN 14 6 - 20 mg/dL   Creatinine, Ser 0.70 0.44 - 1.00 mg/dL   Glucose, Bld 162 (H) 65 - 99 mg/dL   Calcium, Ion 1.17 1.15 - 1.40 mmol/L   TCO2 27 22 - 32 mmol/L   Hemoglobin 9.2 (L) 12.0 - 15.0 g/dL   HCT 27.0 (L) 36.0 - 46.0 %  Glucose, capillary     Status: Abnormal   Collection Time: 09/01/17  4:28 PM  Result Value Ref Range   Glucose-Capillary 152 (H) 65 - 99 mg/dL   Comment 1 Notify RN   Glucose, capillary     Status: Abnormal   Collection Time: 09/01/17 10:05 PM  Result Value Ref Range   Glucose-Capillary 122 (H) 65 - 99 mg/dL   Comment 1 Capillary Specimen   Comprehensive metabolic panel     Status: Abnormal   Collection Time: 09/02/17  4:46 AM  Result Value Ref Range   Sodium 138 135 - 145 mmol/L   Potassium 3.4 (L) 3.5 - 5.1 mmol/L   Chloride 102 101 - 111 mmol/L   CO2 28 22 - 32 mmol/L   Glucose, Bld 189 (H) 65 - 99 mg/dL   BUN 15 6 - 20 mg/dL   Creatinine, Ser 0.86 0.44 - 1.00 mg/dL   Calcium  8.3 (L) 8.9 - 10.3 mg/dL   Total Protein 5.4 (L) 6.5 - 8.1 g/dL   Albumin 2.7 (L) 3.5 - 5.0 g/dL   AST 16 15 - 41 U/L   ALT 19 14 - 54 U/L   Alkaline Phosphatase 59 38 - 126 U/L   Total Bilirubin 0.9 0.3 - 1.2 mg/dL   GFR calc non Af Amer >60 >60 mL/min   GFR calc Af Amer >60 >60 mL/min   Anion gap 8 5 - 15  CBC     Status: Abnormal    Collection Time: 09/02/17  4:46 AM  Result Value Ref Range   WBC 17.6 (H) 4.0 - 10.5 K/uL   RBC 2.97 (L) 3.87 - 5.11 MIL/uL   Hemoglobin 8.6 (L) 12.0 - 15.0 g/dL   HCT 26.7 (L) 36.0 - 46.0 %   MCV 89.9 78.0 - 100.0 fL   MCH 29.0 26.0 - 34.0 pg   MCHC 32.2 30.0 - 36.0 g/dL   RDW 15.5 11.5 - 15.5 %   Platelets 258 150 - 400 K/uL  Glucose, capillary     Status: Abnormal   Collection Time: 09/02/17  6:44 AM  Result Value Ref Range   Glucose-Capillary 179 (H) 65 - 99 mg/dL   Comment 1 Notify RN    No results found.  Assessment/Plan: Diagnosis: Cardiac debility Labs independently reviewed.  Records reviewed and summated above.  1. Does the need for close, 24 hr/day medical supervision in concert with the patient's rehab needs make it unreasonable for this patient to be served in a less intensive setting? No  2. Co-Morbidities requiring supervision/potential complications: leukocytosis (cont to monitor for signs and symptoms of infection, further workup if indicated), Acute blood loss anemia (transfuse if necessary to ensure appropriate perfusion for increased activity tolerance), post-op pain management (Biofeedback training with therapies to help reduce reliance on opiate pain medications, monitor pain control during therapies, and sedation at rest and titrate to maximum efficacy to ensure participation and gains in therapies), bipolar disorder (cont meds), chronic low back pain (see post-op pain), diabetes mellitus (Monitor in accordance with exercise and adjust meds as necessary), chronic abdominal wound, morbid obesity (encourage weight loss), hx of CVA, hypokalemia (continue to monitor and replete as necessary) 3. Due to safety, skin/wound care, disease management and patient education, does the patient require 24 hr/day rehab nursing? Potentially 4. Does the patient require coordinated care of a physician, rehab nurse, PT (1-2 hrs/day, 5 days/week) and OT (1-2 hrs/day, 5 days/week) to  address physical and functional deficits in the context of the above medical diagnosis(es)? Potentially Addressing deficits in the following areas: balance, endurance, locomotion, strength, transferring, bathing, dressing, toileting and psychosocial support 5. Can the patient actively participate in an intensive therapy program of at least 3 hrs of therapy per day at least 5 days per week? Yes 6. The potential for patient to make measurable gains while on inpatient rehab is good 7. Anticipated functional outcomes upon discharge from inpatient rehab are modified independent  with PT, modified independent with OT, n/a with SLP. 8. Estimated rehab length of stay to reach the above functional goals is: 3-6 days. 9. Anticipated D/C setting: Home 10. Anticipated post D/C treatments: HH therapy and Home excercise program 11. Overall Rehab/Functional Prognosis: good  RECOMMENDATIONS: This patient's condition is appropriate for continued rehabilitative care in the following setting: Pt would like to go to SNF in [place].  Given current deficits, if pt does not feel safe for discharge home  recommend SNF in [place].   Patient has agreed to participate in recommended program. Potentially Note that insurance prior authorization may be required for reimbursement for recommended care.  Comment: Rehab Admissions Coordinator to follow up.   I have personally performed a face to face diagnostic evaluation, including, but not limited to relevant history and physical exam findings, of this patient and developed relevant assessment and plan.  Additionally, I have reviewed and concur with the physician assistant's documentation above.   Delice Lesch, MD, ABPMR Lavon Paganini Angiulli, PA-C 09/02/2017

## 2017-09-02 NOTE — Progress Notes (Addendum)
[facility] DAILY ICU PROGRESS NOTE                   [address]            [place] [address]          [phone]   4 Days Post-Op Procedure(s) (LRB): CORONARY ARTERY BYPASS GRAFTING (CABG) x 3 WITH ENDOSCOPIC HARVESTING OF RIGHT SAPHENOUS VEIN (N/A) TRANSESOPHAGEAL ECHOCARDIOGRAM (TEE) (N/A)  Total Length of Stay:  LOS: 6 days   Subjective: Feels okay this morning. She is worried that her orthopedic issues with her back and knees will hold her back.   Objective: Vital signs in last 24 hours: Temp:  [96.7 F (35.9 C)-98.6 F (37 C)] 97.9 F (36.6 C) (05/21 0837) Pulse Rate:  [70-84] 74 (05/21 0700) Cardiac Rhythm: Normal sinus rhythm (05/21 0400) Resp:  [17-26] 23 (05/21 0700) BP: (75-143)/(33-99) 134/99 (05/21 0700) SpO2:  [91 %-100 %] 98 % (05/21 0830) Weight:  [264 lb 5.3 oz (119.9 kg)] 264 lb 5.3 oz (119.9 kg) (05/21 0600)  Filed Weights   08/31/17 0130 09/01/17 0700 09/02/17 0600  Weight: 275 lb 5.7 oz (124.9 kg) 265 lb 10.5 oz (120.5 kg) 264 lb 5.3 oz (119.9 kg)    Weight change: -1 lb 5.2 oz (-0.6 kg)      Intake/Output from previous day: 05/20 0701 - 05/21 0700 In: 820 [P.O.:720; IV Piggyback:100] Out: 5188 [Urine:1325]  Intake/Output this shift: No intake/output data recorded.  Current Meds: Scheduled Meds: . acetaminophen  1,000 mg Oral Q6H   Or  . acetaminophen (TYLENOL) oral liquid 160 mg/5 mL  1,000 mg Per Tube Q6H  . amiodarone  400 mg Oral BID  . aspirin EC  325 mg Oral Daily   Or  . aspirin  324 mg Per Tube Daily  . bisacodyl  10 mg Oral Daily   Or  . bisacodyl  10 mg Rectal Daily  . buPROPion  300 mg Oral q morning - 10a  . chlorhexidine gluconate (MEDLINE KIT)  15 mL Mouth Rinse BID  . Chlorhexidine Gluconate Cloth  6 each Topical Daily  . docusate sodium  200 mg Oral Daily  . enoxaparin (LOVENOX) injection  30 mg Subcutaneous QHS  . furosemide  40 mg Intravenous BID  . gabapentin  300 mg Oral BID  . insulin aspart   0-20 Units Subcutaneous TID WC  . insulin aspart  0-5 Units Subcutaneous QHS  . insulin detemir  20 Units Subcutaneous Daily  . lamoTRIgine  150 mg Oral QHS  . levalbuterol  1.25 mg Nebulization TID  . levofloxacin  500 mg Oral Daily  . levothyroxine  100 mcg Oral QAC breakfast  . metoprolol tartrate  12.5 mg Oral BID   Or  . metoprolol tartrate  12.5 mg Per Tube BID  . mupirocin cream   Topical BID  . mupirocin ointment   Nasal BID  . pantoprazole  40 mg Oral Daily  . potassium chloride  20 mEq Oral BID  . rOPINIRole  2 mg Oral Daily   And  . rOPINIRole  4 mg Oral QHS  . sodium chloride flush  10-40 mL Intracatheter Q12H  . sodium chloride flush  3 mL Intravenous Q12H  . venlafaxine XR  300 mg Oral QHS   Continuous Infusions: PRN Meds:.metoprolol tartrate, ondansetron (ZOFRAN) IV, potassium chloride, sodium chloride flush, sodium chloride flush, traMADol  General appearance: alert, cooperative and no distress Heart: regular rate and rhythm, S1, S2  normal, no murmur, click, rub or gallop Lungs: clear to auscultation bilaterally Abdomen: soft, non-tender; bowel sounds normal; no masses,  no organomegaly Extremities: 1+ non-pitting edema Wound: clean and dry with breast binder in place.   Lab Results: CBC: Recent Labs    09/01/17 0427 09/01/17 1522 09/02/17 0446  WBC 21.3*  --  17.6*  HGB 9.0* 9.2* 8.6*  HCT 28.2* 27.0* 26.7*  PLT 199  --  258   BMET:  Recent Labs    09/01/17 0427 09/01/17 1522 09/02/17 0446  NA 137 140 138  K 3.5 3.5 3.4*  CL 102 100* 102  CO2 26  --  28  GLUCOSE 164* 162* 189*  BUN 15 14 15   CREATININE 0.94 0.70 0.86  CALCIUM 8.2*  --  8.3*    CMET: Lab Results  Component Value Date   WBC 17.6 (H) 09/02/2017   HGB 8.6 (L) 09/02/2017   HCT 26.7 (L) 09/02/2017   PLT 258 09/02/2017   GLUCOSE 189 (H) 09/02/2017   CHOL 161 07/10/2016   TRIG 153 (H) 07/10/2016   HDL 47 (L) 07/10/2016   LDLCALC 83 07/10/2016   ALT 19 09/02/2017   AST 16  09/02/2017   NA 138 09/02/2017   K 3.4 (L) 09/02/2017   CL 102 09/02/2017   CREATININE 0.86 09/02/2017   BUN 15 09/02/2017   CO2 28 09/02/2017   TSH 3.71 07/11/2017   INR 1.23 08/29/2017   HGBA1C 7.2 (H) 08/28/2017   MICROALBUR 1.5 07/10/2016      PT/INR: No results for input(s): LABPROT, INR in the last 72 hours. Radiology: No results found.   Assessment/Plan: S/P Procedure(s) (LRB): CORONARY ARTERY BYPASS GRAFTING (CABG) x 3 WITH ENDOSCOPIC HARVESTING OF RIGHT SAPHENOUS VEIN (N/A) TRANSESOPHAGEAL ECHOCARDIOGRAM (TEE) (N/A)  1. CV- post-op atrial fibrillation. Now on Amio 482m BID. In NSR in the 70s.  2. Pulm- tolerating room air with good oxygen saturation. Encouraged incentive spirometer use. Chest tubes are out.  3. Renal-weight is trending down. Creatinine 0.86, hypokalemia. Replacing. On Lasix 474mIV BID for fluid overload.  4. Blood glucose level with moderate control. She is on SSI and levemir.  5. H and H is stable and platelets trending up. 6. Continue aggressive work with ambulation. PT following will consult OT .    Plan: Continue to work with PT/OT regarding mobility issues. OOB to chair this morning. Remains in NSR. Continue foley catheter until more mobile. Continue diuresis for fluid overload.     TeElgie [name]/21/2019 8:59 AM   Resting comfortably Leave foley in place Attention to  areas of chronic skin breakdown needed Consult to CIR placed because of sig chronic orthopedic issues Cont with iv lasix  patient examined and medical record reviewed,agree with above note. PeTharon Aquasrigt III 09/02/2017

## 2017-09-02 NOTE — Evaluation (Signed)
Occupational Therapy Evaluation Patient Details Name: Norma [name] MRN: [id number] DOB: 11/25/1950 Today's Date: 09/02/2017    History of Present Illness 67 yo admitted with [place] s/p CABG x 3. PMHx: bipolar, DM, HTN, CAD, left parietal CVA Jan 2019, LBBB, AS, neuropathy   Clinical Impression   PTA, pt was living alone and was independent. Pt currently requiring Min A for UB ADLs, Max A for LB ADLs, and Min Guard A for functional mobility with RW. Providing education on sternal precautions, UB ADLs, LB ADLs, toileting, and bed mobility. Pt will requiring further acute OT to increase safety and independence with ADLs and functional mobility. Recommend dc to SNF for further OT to optimize return to PLOF and facilitate safe return home.     Follow Up Recommendations  SNF;Supervision/Assistance - 24 hour    Equipment Recommendations  Other (comment)(Defer to next venue)    Recommendations for Other Services PT consult     Precautions / Restrictions Precautions Precautions: Fall;Sternal Precaution Comments: Reviewed sternal precautions. Restrictions Weight Bearing Restrictions: Yes(sternal precautions)      Mobility Bed Mobility Overal bed mobility: Needs Assistance Bed Mobility: Rolling;Sidelying to Sit Rolling: Min assist Sidelying to sit: Min assist       General bed mobility comments: Min A to roll towards left side. Pt bringing BLEs over EOB and Min A to power up into sitting  Transfers Overall transfer level: Needs assistance Equipment used: Rolling walker (2 wheeled) Transfers: Sit to/from Stand Sit to Stand: Min guard         General transfer comment: Min Guard A for safety in standing. VCs for sequencing and sternal precautions.     Balance Overall balance assessment: Needs assistance   Sitting balance-Leahy Scale: Good       Standing balance-Leahy Scale: Fair                             ADL either performed or assessed with clinical  judgement   ADL Overall ADL's : Needs assistance/impaired Eating/Feeding: Set up;Sitting   Grooming: Brushing hair;Minimal assistance;Sitting Grooming Details (indicate cue type and reason): Min A to brush back of hair.  Upper Body Bathing: Minimal assistance;Sitting   Lower Body Bathing: Maximal assistance;Sit to/from stand   Upper Body Dressing : Minimal assistance;Sitting Upper Body Dressing Details (indicate cue type and reason): Donning second gown like jacket. Education pt on compensatory technique for UB dressing Lower Body Dressing: Maximal assistance;Sit to/from stand Lower Body Dressing Details (indicate cue type and reason): Pt unable to bring ankles to knees. Will need education on AE Toilet Transfer: Min guard;Ambulation;RW(Simulated to recliner) Armed forces technical officer Details (indicate cue type and reason): Min Guard A for safety. VCs for sequencing and sternal precautions   Toileting - Clothing Manipulation Details (indicate cue type and reason): Educating pt on compensatory techniques for toielt hygiene to adhere to sternal precautions     Functional mobility during ADLs: Min guard;Rolling walker General ADL Comments: Pt with decreased activity tolerance and ROM. Pt reports dizziness during funcitonal mobility. BP before activity 119/63 and after activity 135/61     Vision         Perception     Praxis      Pertinent Vitals/Pain Pain Assessment: No/denies pain     Hand Dominance Right   Extremity/Trunk Assessment Upper Extremity Assessment Upper Extremity Assessment: Generalized weakness   Lower Extremity Assessment Lower Extremity Assessment: Defer to PT evaluation   Cervical / Trunk  Assessment Cervical / Trunk Assessment: Other exceptions Cervical / Trunk Exceptions: s/p CABG   Communication Communication Communication: No difficulties   Cognition Arousal/Alertness: Awake/alert Behavior During Therapy: WFL for tasks assessed/performed Overall  Cognitive Status: Within Functional Limits for tasks assessed                                     General Comments  1 fall in the last year. 3 cats at home    Exercises     Shoulder Instructions      Home Living Family/patient expects to be discharged to:: Skilled nursing facility Living Arrangements: Alone   Type of Home: House Home Access: Stairs to enter Entrance Stairs-Number of Steps: 2 Entrance Stairs-Rails: Right Home Layout: Two level;Able to live on main level with bedroom/bathroom     Bathroom Shower/Tub: Tub/shower unit   Bathroom Toilet: Handicapped height     Home Equipment: [place] - single point          Prior Functioning/Environment Level of Independence: Independent                 OT Problem List: Decreased strength;Decreased range of motion;Decreased activity tolerance;Impaired balance (sitting and/or standing);Decreased knowledge of use of DME or AE;Decreased knowledge of precautions      OT Treatment/Interventions: Self-care/ADL training;Therapeutic exercise;Energy conservation;DME and/or AE instruction;Therapeutic activities;Patient/family education    OT Goals(Current goals can be found in the care plan section) Acute Rehab OT Goals Patient Stated Goal: work for [employer], watch Tv OT Goal Formulation: With patient Time For Goal Achievement: 09/16/17 Potential to Achieve Goals: Good ADL Goals Pt Will Perform Upper Body Dressing: with modified independence;sitting Pt Will Perform Lower Body Dressing: with modified independence;with adaptive equipment;sit to/from stand Pt Will Transfer to Toilet: with modified independence;ambulating;bedside commode Pt Will Perform Toileting - Clothing Manipulation and hygiene: with modified independence;sit to/from stand Additional ADL Goal #1: Pt will independently verablize sternal precautions and adherance during ADLs. Additional ADL Goal #2: Pt will perform bed mobility at Mod I level in  preparation for ADLs  OT Frequency: Min 2X/week   Barriers to D/C:            Co-evaluation              AM-PAC PT "6 Clicks" Daily Activity     Outcome Measure Help from another person eating meals?: None Help from another person taking care of personal grooming?: A Little Help from another person toileting, which includes using toliet, bedpan, or urinal?: A Little Help from another person bathing (including washing, rinsing, drying)?: A Lot Help from another person to put on and taking off regular upper body clothing?: A Little Help from another person to put on and taking off regular lower body clothing?: A Lot 6 Click Score: 17   End of Session Equipment Utilized During Treatment: Gait belt;Rolling walker Nurse Communication: Mobility status  Activity Tolerance: Patient tolerated treatment well Patient left: in chair;with call bell/phone within reach  OT Visit Diagnosis: Unsteadiness on feet (R26.81);Other abnormalities of gait and mobility (R26.89);Muscle weakness (generalized) (M62.81)                Time: 2130-8657 OT Time Calculation (min): 24 min Charges:  OT General Charges $OT Visit: 1 Visit OT Evaluation $OT Eval Moderate Complexity: 1 Mod OT Treatments $Self Care/Home Management : 8-22 mins G-Codes:     Manolito Jurewicz MSOT, OTR/L Acute Rehab Pager:  [phone] Office: [place] 09/02/2017, 11:58 AM

## 2017-09-02 NOTE — Progress Notes (Addendum)
Inpatient Rehabilitation-Admissions Coordinator   Met with Pt at the bedside as follow up from PM&R consult. AC discussed recommendations for rehab venue. Pt has a SNF in mind that she would consider. Pt is aware that she is not a candidate for CIR at this time. AC has contacted SW regarding recommendation. CIR will sign off at this time. Please call for questions.   Jhonnie Garner, OTR/L  Rehab Admissions Coordinator  [phone] 09/02/2017 4:56 PM

## 2017-09-03 ENCOUNTER — Inpatient Hospital Stay (HOSPITAL_COMMUNITY): Payer: PPO

## 2017-09-03 LAB — COMPREHENSIVE METABOLIC PANEL
ALT: 18 U/L (ref 14–54)
AST: 12 U/L — ABNORMAL LOW (ref 15–41)
Albumin: 2.8 g/dL — ABNORMAL LOW (ref 3.5–5.0)
Alkaline Phosphatase: 55 U/L (ref 38–126)
Anion gap: 9 (ref 5–15)
BUN: 16 mg/dL (ref 6–20)
CO2: 27 mmol/L (ref 22–32)
Calcium: 8.5 mg/dL — ABNORMAL LOW (ref 8.9–10.3)
Chloride: 104 mmol/L (ref 101–111)
Creatinine, Ser: 0.89 mg/dL (ref 0.44–1.00)
GFR calc Af Amer: >60 mL/min (ref >60)
GFR calc non Af Amer: >60 mL/min (ref >60)
Glucose, Bld: 158 mg/dL — ABNORMAL HIGH (ref 65–99)
Potassium: 4.3 mmol/L (ref 3.5–5.1)
Sodium: 140 mmol/L (ref 135–145)
Total Bilirubin: 0.5 mg/dL (ref 0.3–1.2)
Total Protein: 5.6 g/dL — ABNORMAL LOW (ref 6.5–8.1)

## 2017-09-03 LAB — CBC
HCT: 26.7 % — ABNORMAL LOW (ref 36.0–46.0)
Hemoglobin: 8.6 g/dL — ABNORMAL LOW (ref 12.0–15.0)
MCH: 28.9 pg (ref 26.0–34.0)
MCHC: 32.2 g/dL (ref 30.0–36.0)
MCV: 89.6 fL (ref 78.0–100.0)
Platelets: 272 10*3/uL (ref 150–400)
RBC: 2.98 MIL/uL — ABNORMAL LOW (ref 3.87–5.11)
RDW: 15.2 % (ref 11.5–15.5)
WBC: 13.7 10*3/uL — ABNORMAL HIGH (ref 4.0–10.5)

## 2017-09-03 LAB — GLUCOSE, CAPILLARY
GLUCOSE-CAPILLARY: 136 mg/dL — AB (ref 65–99)
Glucose-Capillary: 163 mg/dL — ABNORMAL HIGH (ref 65–99)
Glucose-Capillary: 147 mg/dL — ABNORMAL HIGH (ref 65–99)
Glucose-Capillary: 145 mg/dL — ABNORMAL HIGH (ref 65–99)
Glucose-Capillary: 214 mg/dL — ABNORMAL HIGH (ref 65–99)

## 2017-09-03 MED ORDER — AMIODARONE HCL 200 MG PO TABS
200.0000 mg | ORAL_TABLET | Freq: Two times a day (BID) | ORAL | Status: DC
  Administered 2017-09-03 – 2017-09-05 (×5): 200 mg via ORAL
  Filled 2017-09-03 (×5): qty 1

## 2017-09-03 MED ORDER — FUROSEMIDE 10 MG/ML IJ SOLN
40.0000 mg | Freq: Every day | INTRAMUSCULAR | Status: DC
  Administered 2017-09-03 – 2017-09-04 (×2): 40 mg via INTRAVENOUS
  Filled 2017-09-03 (×3): qty 4

## 2017-09-03 MED FILL — Mannitol IV Soln 20%: INTRAVENOUS | Qty: 500 | Status: AC

## 2017-09-03 MED FILL — Lidocaine HCl Local Soln Prefilled Syringe 100 MG/5ML (2%): INTRAMUSCULAR | Qty: 5 | Status: AC

## 2017-09-03 MED FILL — Heparin Sodium (Porcine) Inj 1000 Unit/ML: INTRAMUSCULAR | Qty: 10 | Status: AC

## 2017-09-03 MED FILL — Sodium Bicarbonate IV Soln 8.4%: INTRAVENOUS | Qty: 50 | Status: AC

## 2017-09-03 MED FILL — Electrolyte-R (PH 7.4) Solution: INTRAVENOUS | Qty: 4000 | Status: AC

## 2017-09-03 MED FILL — Sodium Chloride IV Soln 0.9%: INTRAVENOUS | Qty: 2000 | Status: AC

## 2017-09-03 MED FILL — Calcium Chloride Inj 10%: INTRAVENOUS | Qty: 10 | Status: AC

## 2017-09-03 MED FILL — Heparin Sodium (Porcine) Inj 1000 Unit/ML: INTRAMUSCULAR | Qty: 30 | Status: AC

## 2017-09-03 NOTE — Progress Notes (Addendum)
[facility] DAILY ICU PROGRESS NOTE                   [address]            [place] [address]          [phone]   5 Days Post-Op Procedure(s) (LRB): CORONARY ARTERY BYPASS GRAFTING (CABG) x 3 WITH ENDOSCOPIC HARVESTING OF RIGHT SAPHENOUS VEIN (N/A) TRANSESOPHAGEAL ECHOCARDIOGRAM (TEE) (N/A)  Total Length of Stay:  LOS: 7 days   Subjective: She feels okay today.   Objective: Vital signs in last 24 hours: Temp:  [97.9 F (36.6 C)-98.4 F (36.9 C)] 98.1 F (36.7 C) (05/22 0800) Pulse Rate:  [66-114] 69 (05/22 0800) Cardiac Rhythm: Normal sinus rhythm (05/21 2200) Resp:  [17-30] 17 (05/22 0800) BP: (93-143)/(35-102) 96/35 (05/22 0800) SpO2:  [90 %-100 %] 94 % (05/22 0822) Weight:  [262 lb 12.6 oz (119.2 kg)] 262 lb 12.6 oz (119.2 kg) (05/22 0600)  Filed Weights   09/01/17 0700 09/02/17 0600 09/03/17 0600  Weight: 265 lb 10.5 oz (120.5 kg) 264 lb 5.3 oz (119.9 kg) 262 lb 12.6 oz (119.2 kg)    Weight change: -1 lb 8.7 oz (-0.7 kg)      Intake/Output from previous day: 05/21 0701 - 05/22 0700 In: 957 [P.O.:957] Out: 2550 [Urine:2550]  Intake/Output this shift: No intake/output data recorded.  Current Meds: Scheduled Meds: . acetaminophen  1,000 mg Oral Q6H   Or  . acetaminophen (TYLENOL) oral liquid 160 mg/5 mL  1,000 mg Per Tube Q6H  . amiodarone  200 mg Oral BID  . aspirin EC  325 mg Oral Daily   Or  . aspirin  324 mg Per Tube Daily  . bisacodyl  10 mg Oral Daily   Or  . bisacodyl  10 mg Rectal Daily  . buPROPion  300 mg Oral q morning - 10a  . chlorhexidine gluconate (MEDLINE KIT)  15 mL Mouth Rinse BID  . Chlorhexidine Gluconate Cloth  6 each Topical Daily  . docusate sodium  200 mg Oral Daily  . enoxaparin (LOVENOX) injection  30 mg Subcutaneous QHS  . furosemide  40 mg Intravenous Daily  . gabapentin  300 mg Oral BID  . insulin aspart  0-20 Units Subcutaneous TID WC  . insulin aspart  0-5 Units Subcutaneous QHS  . insulin detemir  24  Units Subcutaneous Daily  . lamoTRIgine  150 mg Oral QHS  . levalbuterol  1.25 mg Nebulization TID  . levothyroxine  100 mcg Oral QAC breakfast  . metoprolol tartrate  12.5 mg Oral BID   Or  . metoprolol tartrate  12.5 mg Per Tube BID  . mupirocin cream   Topical BID  . mupirocin ointment   Nasal BID  . pantoprazole  40 mg Oral Daily  . potassium chloride  40 mEq Oral BID  . rOPINIRole  2 mg Oral Daily   And  . rOPINIRole  4 mg Oral QHS  . sodium chloride flush  10-40 mL Intracatheter Q12H  . sodium chloride flush  3 mL Intravenous Q12H  . venlafaxine XR  300 mg Oral QHS   Continuous Infusions: PRN Meds:.metoprolol tartrate, ondansetron (ZOFRAN) IV, potassium chloride, sodium chloride flush, sodium chloride flush, traMADol  General appearance: alert, cooperative and no distress Heart: regular rate and rhythm, S1, S2 normal, no murmur, click, rub or gallop Lungs: clear to auscultation bilaterally Abdomen: soft, non-tender; bowel sounds normal; no masses,  no organomegaly Extremities: extremities normal,  atraumatic, no cyanosis or edema Wound: clean and dry  Lab Results: CBC: Recent Labs    09/02/17 0446 09/02/17 1543 09/03/17 0457  WBC 17.6*  --  13.7*  HGB 8.6* 9.2* 8.6*  HCT 26.7* 27.0* 26.7*  PLT 258  --  272   BMET:  Recent Labs    09/02/17 0446 09/02/17 1543 09/03/17 0457  NA 138 139 140  K 3.4* 3.9 4.3  CL 102 98* 104  CO2 28  --  27  GLUCOSE 189* 182* 158*  BUN 15 16 16   CREATININE 0.86 0.80 0.89  CALCIUM 8.3*  --  8.5*    CMET: Lab Results  Component Value Date   WBC 13.7 (H) 09/03/2017   HGB 8.6 (L) 09/03/2017   HCT 26.7 (L) 09/03/2017   PLT 272 09/03/2017   GLUCOSE 158 (H) 09/03/2017   CHOL 161 07/10/2016   TRIG 153 (H) 07/10/2016   HDL 47 (L) 07/10/2016   LDLCALC 83 07/10/2016   ALT 18 09/03/2017   AST 12 (L) 09/03/2017   NA 140 09/03/2017   K 4.3 09/03/2017   CL 104 09/03/2017   CREATININE 0.89 09/03/2017   BUN 16 09/03/2017   CO2  27 09/03/2017   TSH 3.71 07/11/2017   INR 1.23 08/29/2017   HGBA1C 7.2 (H) 08/28/2017   MICROALBUR 1.5 07/10/2016      PT/INR: No results for input(s): LABPROT, INR in the last 72 hours. Radiology: No results found.   Assessment/Plan: S/P Procedure(s) (LRB): CORONARY ARTERY BYPASS GRAFTING (CABG) x 3 WITH ENDOSCOPIC HARVESTING OF RIGHT SAPHENOUS VEIN (N/A) TRANSESOPHAGEAL ECHOCARDIOGRAM (TEE) (N/A)   1. CV-NSR in the 60s. BP low at times. Amio 253m BID. Continue ASA and not taking a statin. 2. Pulm-CXR stable, on room air with good oxygen saturation 3. Renal-creatinine 0.89, electrolytes okay. Continue IV diuretics for fluid overload.  4. H and H 8.6/26.7 this morning. Platelets 272k 5. Blood glucose well controlled 6.  CIR consult for chronic debility  Plan: Continue to ambulate around the unit. CIR consulted for assistance after hospital stay.     TElgie Collard5/22/2019 8:32 AM   [facility]  patient examined and medical record reviewed,agree with above note. PTharon AquasTrigt III 09/03/2017

## 2017-09-04 LAB — GLUCOSE, CAPILLARY
GLUCOSE-CAPILLARY: 148 mg/dL — AB (ref 65–99)
GLUCOSE-CAPILLARY: 144 mg/dL — AB (ref 65–99)
GLUCOSE-CAPILLARY: 136 mg/dL — AB (ref 65–99)
GLUCOSE-CAPILLARY: 194 mg/dL — AB (ref 65–99)
Glucose-Capillary: 150 mg/dL — ABNORMAL HIGH (ref 65–99)

## 2017-09-04 LAB — CBC
HCT: 28 % — ABNORMAL LOW (ref 36.0–46.0)
Hemoglobin: 8.9 g/dL — ABNORMAL LOW (ref 12.0–15.0)
MCH: 29 pg (ref 26.0–34.0)
MCHC: 31.8 g/dL (ref 30.0–36.0)
MCV: 91.2 fL (ref 78.0–100.0)
Platelets: 286 10*3/uL (ref 150–400)
RBC: 3.07 MIL/uL — ABNORMAL LOW (ref 3.87–5.11)
RDW: 15.6 % — ABNORMAL HIGH (ref 11.5–15.5)
WBC: 14.2 10*3/uL — ABNORMAL HIGH (ref 4.0–10.5)

## 2017-09-04 LAB — COMPREHENSIVE METABOLIC PANEL
ALT: 17 U/L (ref 14–54)
AST: 13 U/L — ABNORMAL LOW (ref 15–41)
Albumin: 3 g/dL — ABNORMAL LOW (ref 3.5–5.0)
Alkaline Phosphatase: 59 U/L (ref 38–126)
Anion gap: 9 (ref 5–15)
BUN: 14 mg/dL (ref 6–20)
CO2: 29 mmol/L (ref 22–32)
Calcium: 8.9 mg/dL (ref 8.9–10.3)
Chloride: 101 mmol/L (ref 101–111)
Creatinine, Ser: 0.87 mg/dL (ref 0.44–1.00)
GFR calc Af Amer: >60 mL/min (ref >60)
GFR calc non Af Amer: >60 mL/min (ref >60)
Glucose, Bld: 129 mg/dL — ABNORMAL HIGH (ref 65–99)
Potassium: 4.2 mmol/L (ref 3.5–5.1)
Sodium: 139 mmol/L (ref 135–145)
Total Bilirubin: 0.5 mg/dL (ref 0.3–1.2)
Total Protein: 6.1 g/dL — ABNORMAL LOW (ref 6.5–8.1)

## 2017-09-04 MED ORDER — LORATADINE 10 MG PO TABS
10.0000 mg | ORAL_TABLET | Freq: Every day | ORAL | Status: DC
  Administered 2017-09-04: 10 mg via ORAL
  Filled 2017-09-04: qty 1

## 2017-09-04 NOTE — Discharge Instructions (Signed)
Endoscopic Saphenous Vein Harvesting, Care After °Refer to this sheet in the next few weeks. These instructions provide you with information about caring for yourself after your procedure. Your health care provider may also give you more specific instructions. Your treatment has been planned according to current medical practices, but problems sometimes occur. Call your health care provider if you have any problems or questions after your procedure. °What can I expect after the procedure? °After the procedure, it is common to have: °· Pain. °· Bruising. °· Swelling. °· Numbness. ° °Follow these instructions at home: °Medicine °· Take over-the-counter and prescription medicines only as told by your health care provider. °· Do not drive or operate heavy machinery while taking prescription pain medicine. °Incision care ° °· Follow instructions from your health care provider about how to take care of the cut made during surgery (incision). Make sure you: °? Wash your hands with soap and water before you change your bandage (dressing). If soap and water are not available, use hand sanitizer. °? Change your dressing as told by your health care provider. °? Leave stitches (sutures), skin glue, or adhesive strips in place. These skin closures may need to be in place for 2 weeks or longer. If adhesive strip edges start to loosen and curl up, you may trim the loose edges. Do not remove adhesive strips completely unless your health care provider tells you to do that. °· Check your incision area every day for signs of infection. Check for: °? More redness, swelling, or pain. °? More fluid or blood. °? Warmth. °? Pus or a bad smell. °General instructions °· Raise (elevate) your legs above the level of your heart while you are sitting or lying down. °· Do any exercises your health care providers have given you. These may include deep breathing, coughing, and walking exercises. °· Do not shower, take baths, swim, or use a hot tub  unless told by your health care provider. °· Wear your elastic stocking if told by your health care provider. °· Keep all follow-up visits as told by your health care provider. This is important. °Contact a health care provider if: °· Medicine does not help your pain. °· Your pain gets worse. °· You have new leg bruises or your leg bruises get bigger. °· You have a fever. °· Your leg feels numb. °· You have more redness, swelling, or pain around your incision. °· You have more fluid or blood coming from your incision. °· Your incision feels warm to the touch. °· You have pus or a bad smell coming from your incision. °Get help right away if: °· Your pain is severe. °· You develop pain, tenderness, warmth, redness, or swelling in any part of your leg. °· You have chest pain. °· You have trouble breathing. °This information is not intended to replace advice given to you by your health care provider. Make sure you discuss any questions you have with your health care provider. °Document Released: 12/12/2010 Document Revised: 09/07/2015 Document Reviewed: 02/13/2015 °Elsevier Interactive Patient Education © 2018 [employer] °Coronary Artery Bypass Grafting, Care After °These instructions give you information on caring for yourself after your procedure. Your doctor may also give you more specific instructions. Call your doctor if you have any problems or questions after your procedure. °Follow these instructions at home: °· Only take medicine as told by your doctor. Take medicines exactly as told. Do not stop taking medicines or start any new medicines without talking to your doctor first. °·   Take your pulse as told by your doctor. °· Do deep breathing as told by your doctor. Use your breathing device (incentive spirometer), if given, to practice deep breathing several times a day. Support your chest with a pillow or your arms when you take deep breaths or cough. °· Keep the area clean, dry, and protected where the  surgery cuts (incisions) were made. Remove bandages (dressings) only as told by your doctor. If strips were applied to surgical area, do not take them off. They fall off on their own. °· Check the surgery area daily for puffiness (swelling), redness, or leaking fluid. °· If surgery cuts were made in your legs: °? Avoid crossing your legs. °? Avoid sitting for long periods of time. Change positions every 30 minutes. °? Raise your legs when you are sitting. Place them on pillows. °· Wear stockings that help keep blood clots from forming in your legs (compression stockings). °· Only take sponge baths until your doctor says it is okay to take showers. Pat the surgery area dry. Do not rub the surgery area with a washcloth or towel. Do not bathe, swim, or use a hot tub until your doctor says it is okay. °· Eat foods that are high in fiber. These include raw fruits and vegetables, whole grains, beans, and nuts. Choose lean meats. Avoid canned, processed, and fried foods. °· Drink enough fluids to keep your pee (urine) clear or pale yellow. °· Weigh yourself every day. °· Rest and limit activity as told by your doctor. You may be told to: °? Stop any activity if you have chest pain, shortness of breath, changes in heartbeat, or dizziness. Get help right away if this happens. °? Move around often for short amounts of time or take short walks as told by your doctor. Gradually become more active. You may need help to strengthen your muscles and build endurance. °? Avoid lifting, pushing, or pulling anything heavier than 10 pounds (4.5 kg) for at least 6 weeks after surgery. °· Do not drive until your doctor says it is okay. °· Ask your doctor when you can go back to work. °· Ask your doctor when you can begin sexual activity again. °· Follow up with your doctor as told. °Contact a doctor if: °· You have puffiness, redness, more pain, or fluid draining from the incision site. °· You have a fever. °· You have puffiness in your  ankles or legs. °· You have pain in your legs. °· You gain 2 or more pounds (0.9 kg) a day. °· You feel sick to your stomach (nauseous) or throw up (vomit). °· You have watery poop (diarrhea). °Get help right away if: °· You have chest pain that goes to your jaw or arms. °· You have shortness of breath. °· You have a fast or irregular heartbeat. °· You notice a "clicking" in your breastbone when you move. °· You have numbness or weakness in your arms or legs. °· You feel dizzy or light-headed. °This information is not intended to replace advice given to you by your health care provider. Make sure you discuss any questions you have with your health care provider. °Document Released: 04/06/2013 Document Revised: 09/07/2015 Document Reviewed: 09/08/2012 °Elsevier Interactive Patient Education © 2017 [employer] Inc. ° °

## 2017-09-04 NOTE — Clinical Social Work Note (Addendum)
CSW met with patient and confirmed plan for [facility]. She is aware of daily copays. Per PA note, patient should be ready for discharge tomorrow. CSW left voicemail for admissions coordinator to confirm bed availability tomorrow. Will start insurance authorization once confirmed.  Norma [name], [place]  2:28 pm [facility] will have a bed for patient tomorrow. Will need discharge summary by 4:00 pm. CSW started insurance authorization. 30-day note on the front of her chart to be signed by MD for PASARR review. CSW paged PA.  Norma [name], [place] [phone]  4:08 pm Requested documents uploaded into [place] [facility] for PASARR review.  Norma [name], [place]

## 2017-09-04 NOTE — Discharge Summary (Addendum)
Physician Discharge Summary  Patient ID: NIL BOLSER MRN: [id number] DOB/AGE: 1950-06-12 67 y.o.  Admit date: 08/27/2017 Discharge date: 09/05/2017  Admission Diagnoses: Chest pain  Discharge Diagnoses:  Principal Problem:   Unstable angina ([place]) Active Problems:   Hyperlipidemia associated with type 2 diabetes mellitus ([place])   Obstructive sleep apnea   Essential hypertension   Insulin dependent diabetes mellitus ([place])   Morbid obesity ([place])   Coronary artery disease involving native coronary artery of native heart with unstable angina pectoris (HCC)   Hx of CABG   Pressure injury of skin   Coronary artery disease involving native heart without angina pectoris   S/P CABG x 3   Leukocytosis   Acute blood loss anemia   Post-operative pain   Bipolar affective disorder ([place])   Diabetes mellitus type 2 in obese ([place])   History of CVA (cerebrovascular accident)   Hypokalemia  Patient Active Problem List   Diagnosis Date Noted  . Pressure injury of skin 09/02/2017  . Coronary artery disease involving native heart without angina pectoris   . S/P CABG x 3   . Leukocytosis   . Acute blood loss anemia   . Post-operative pain   . Bipolar affective disorder ([place])   . Diabetes mellitus type 2 in obese ([place])   . History of CVA (cerebrovascular accident)   . Hypokalemia   . Hx of CABG 08/29/2017  . Coronary artery disease involving native coronary artery of native heart with unstable angina pectoris ([place]) 08/28/2017  . Unstable angina ([place]) 08/27/2017  . Cardiomyopathy ([place]) 08/22/2017  . Stroke (cerebrum) ([place]) 05/16/2017  . Low hemoglobin 09/19/2016  . Dizziness 06/21/2016  . Chest pain with moderate risk for cardiac etiology 04/23/2016  . Bipolar I disorder, most recent episode depressed ([place]) 06/05/2015  . Insulin dependent diabetes mellitus ([place]) 09/20/2014  . Chronic low back pain 09/20/2014  . Morbid obesity ([place]) 09/20/2014  . Memory difficulty 09/20/2014  .  Lumbosacral spondylosis without myelopathy 12/25/2012  . Gastroparesis 11/04/2012  . Nausea alone 11/04/2012  . Restless legs syndrome (RLS) 09/17/2012  . Headache(784.0) 03/19/2012  . GERD (gastroesophageal reflux disease) 02/16/2012  . Abdominal pain 02/16/2012  . Hypothyroidism 02/16/2012  . Tremor 02/16/2012  . Hyperlipidemia associated with type 2 diabetes mellitus ([place]) 04/01/2008  . Obstructive sleep apnea 04/01/2008  . Essential hypertension 04/01/2008  . LBBB (left bundle branch block) 04/01/2008   History of present illness:  Patient is a 67 year old obese female with a past medical history significant for multiple coronary risk factors.  These include diabetes mellitus type 2, hypertension obstructive sleep apnea, hyperlipidemia, left bundle branch block and a recently diagnosed left parietal CVA in January 2019.  She also has multiple other medical comorbidities.  She was recently seen at her cardiologist's office to evaluate increasing symptoms of chest pain.  She was seen by Dr. Harl Bowie in the fall 2017 at which time she had a Myoview performed which showed low risk.  More recently she has been having chest pain with activity which has also been associated with shortness of breath.  She was admitted this hospitalization for cardiac catheterization and this revealed severe multivessel coronary artery disease.  Due to these findings cardiothoracic surgical consultation was obtained with Dahlia Byes MD who evaluated the patient and studies and agree with recommendations to proceed with coronary artery surgical revascularization.  Discharged Condition: good  Hospital Course: The procedure was scheduled and on 08/29/2017 the patient was taken to the operating room where she  underwent the below described procedure.  She tolerated it well and was taken to the surgical intensive care unit in stable condition.  Postoperative hospital course:  The patient was extubated without difficulty  using standard postoperative protocol.  She has remained neurologically intact.  Inotropic support was weaned without difficulty.  All routine lines, monitors and drainage devices have been discontinued in the standard fashion.  She did have a postoperative volume overload but is diuresing well.  She has an expected acute blood loss anemia and values have stabilized.  Most recent hemoglobin is 8.9 and hematocrit is 28.0.  Renal function has remained within normal limits.  Most recent BUN is 14 and creatinine 0.87.  Blood sugars have been under good control initially with Glucomander protocols with transition to insulin which she was on preoperatively.  She has had a slow physical recovery due to deconditioning and morbid obesity.  Been seen by both cardiac rehab as well as physical therapy who were assisting but it is felt she will require at least a short-term stay in a skilled nursing facility for further rehabilitation.  Her incisions are healing well without evidence of infection.  She is tolerating diet.  Oxygen has been weaned and she maintains adequate saturations on room air.  She did have postoperative atrial fibrillation but has been chemically cardioverted to sinus rhythm with amiodarone and beta-blocker.  At the time of discharge to the skilled nursing facility the patient is felt to be medically stable.  Consults: None  Significant Diagnostic Studies: Cardiac catheterization  LEFT HEART CATH AND CORONARY ANGIOGRAPHY  Conclusion     RPDA lesion is 80% stenosed.  Ost 1st Mrg to 1st Mrg lesion is 90% stenosed.  Prox LAD lesion is 95% stenosed.  Prox LAD to Mid LAD lesion is 50% stenosed.  Ost 1st Diag lesion is 90% stenosed.  1st Diag lesion is 80% stenosed.  The left ventricular ejection fraction is 50-55% by visual estimate.  There is mild left ventricular systolic dysfunction.   Treatments: surgery:    DATE OF PROCEDURE:  08/29/2017  PROCEDURE PERFORMED: 1.  Coronary  artery bypass grafting x3 (sequential saphenous vein graft diagonal to LAD, saphenous vein graft to obtuse marginal). 2.  Endoscopic harvest of right leg greater saphenous vein.  SURGEON:  Ivin Poot III, MD  ASSISTANT:  Nicholes Rough, PA-C   PREOPERATIVE DIAGNOSES:  Non-ST elevation myocardial infarction, diabetes, morbid obesity, moderate left ventricular dysfunction.  POSTOPERATIVE DIAGNOSES:  Non-ST elevation myocardial infarction, diabetes, morbid obesity, moderate left ventricular dysfunction.   Discharge Exam: Blood pressure 135/82, pulse 70, temperature 98 F (36.7 C), temperature source Oral, resp. rate (!) 21, height 5\' 4"  (1.626 m), weight 117.7 kg (259 lb 7.7 oz), SpO2 94 %.   General appearance: alert, cooperative and no distress Heart: regular rate and rhythm Lungs: clear to auscultation bilaterally Abdomen: benign Extremities: + edema Wound: incis healing well   Disposition: Discharge disposition: 03-Skilled Nursing Facility       Discharge Instructions    Amb Referral to Cardiac Rehabilitation   Complete by:  As directed    Diagnosis:  CABG   CABG X ___:  3   Discharge patient   Complete by:  As directed    Discharge disposition:  03-Skilled [facility]   Discharge patient date:  09/05/2017     Allergies as of 09/05/2017      Reactions   Amoxicillin Anaphylaxis, Other (See Comments)   Has patient had a PCN reaction causing immediate  rash, facial/tongue/throat swelling, SOB or lightheadedness with hypotension: Yes Has patient had a PCN reaction causing severe rash involving mucus membranes or skin necrosis: Yes Has patient had a PCN reaction that required hospitalization: Yes Has patient had a PCN reaction occurring within the last 10 years: Yes If all of the above answers are "NO", then may proceed with Cephalosporin use.   Hydrocodone Anaphylaxis   Depacon [valproic Acid] Other (See Comments)   Causes falls       Medication List     STOP taking these medications   aspirin-acetaminophen-caffeine 250-250-65 MG tablet Commonly known as:  EXCEDRIN MIGRAINE   CHARCOAL PO   Insulin Pen Needle 31G X 5 MM Misc Commonly known as:  LITE TOUCH PEN NEEDLES   losartan 50 MG tablet Commonly known as:  COZAAR   metFORMIN 500 MG tablet Commonly known as:  GLUCOPHAGE   promethazine 25 MG tablet Commonly known as:  PHENERGAN     TAKE these medications   albuterol 108 (90 Base) MCG/ACT inhaler Commonly known as:  PROVENTIL HFA;VENTOLIN HFA Inhale 2 puffs into the lungs every 6 (six) hours as needed for wheezing or shortness of breath.   amiodarone 200 MG tablet Commonly known as:  PACERONE Take 1 tablet (200 mg total) by mouth 2 (two) times daily.   aspirin 325 MG EC tablet Take 1 tablet (325 mg total) by mouth daily. What changed:    medication strength  how much to take   atorvastatin 10 MG tablet Commonly known as:  LIPITOR Take 1 tablet (10 mg total) by mouth daily at 6 PM.   baclofen 10 MG tablet Commonly known as:  LIORESAL TAKE ONE TABLET BY MOUTH AT BEDTIME.   buPROPion 300 MG 24 hr tablet Commonly known as:  WELLBUTRIN XL TAKE ONE TABLET BY MOUTH EVERY MORNING.   diclofenac sodium 1 % Gel Commonly known as:  VOLTAREN Apply 1 application topically daily as needed (for pain).   EPIPEN 2-PAK 0.3 mg/0.3 mL Soaj injection Generic drug:  EPINEPHrine Inject 0.3 mg into the muscle once.   esomeprazole 40 MG capsule Commonly known as:  NEXIUM Take 40 mg by mouth daily.   furosemide 40 MG tablet Commonly known as:  LASIX Take 1 tablet (40 mg total) by mouth daily for 10 days. What changed:    when to take this  reasons to take this   gabapentin 300 MG capsule Commonly known as:  NEURONTIN Take 1 capsule (300 mg total) by mouth 2 (two) times daily. What changed:  how much to take   Insulin Glargine 300 UNIT/ML Sopn Commonly known as:  TOUJEO SOLOSTAR Inject 30 Units into the skin at  bedtime.   ketoconazole 2 % cream Commonly known as:  NIZORAL Apply 1 application topically 2 (two) times daily as needed for irritation.   lamoTRIgine 150 MG tablet Commonly known as:  LAMICTAL TAKE ONE TABLET BY MOUTH AT BEDTIME.   levothyroxine 100 MCG tablet Commonly known as:  SYNTHROID, LEVOTHROID Take 100 mcg by mouth daily before breakfast.   meclizine 25 MG tablet Commonly known as:  ANTIVERT Take 1 tablet (25 mg total) by mouth 3 (three) times daily as needed for dizziness. What changed:  when to take this   metoprolol tartrate 25 MG tablet Commonly known as:  LOPRESSOR Take 0.5 tablets (12.5 mg total) by mouth 2 (two) times daily.   mupirocin cream 2 % Commonly known as:  BACTROBAN Apply topically 2 (two) times daily.   oxymetazoline  0.05 % nasal spray Commonly known as:  AFRIN Place 1 spray into both nostrils as needed for congestion.   potassium chloride SA 20 MEQ tablet Commonly known as:  K-DUR,KLOR-CON Take 1 tablet (20 mEq total) by mouth daily for 10 days.   REFRESH OP Place 2 drops into both eyes daily as needed (for dry eyes).   rOPINIRole 2 MG tablet Commonly known as:  REQUIP Take 2-4 mg by mouth See admin instructions. Takes 2 mg by mouth in the morning and 4 mg by mouth in the evening   traMADol 50 MG tablet Commonly known as:  ULTRAM Take 1-2 tablets (50-100 mg total) by mouth every 6 (six) hours as needed for up to 7 days for moderate pain. What changed:    how much to take  when to take this  reasons to take this   traZODone 50 MG tablet Commonly known as:  DESYREL Take 50 mg by mouth at bedtime.   venlafaxine XR 150 MG 24 hr capsule Commonly known as:  EFFEXOR-XR TAKE TWO (2) CAPSULES BY MOUTH AT BEDTIME. What changed:  See the new instructions.       Contact information for follow-up providers    Branch, Alphonse Guild, MD Follow up.   Specialty:  Cardiology Why:  Please see discharge paperwork for 2-week follow-up  appointment with cardiology. Contact information: [place] [address] [phone]        Ivin Poot, MD Follow up.   Specialty:  Cardiothoracic Surgery Why:  Appointment to see the surgeon on October 01, 2017 at 12:30 PM.  Is obtain a chest x-ray at [facility] at 12 noon.  [facility] is located in the same office complex on the first floor. Contact information: [address] [place] [address] [phone]            Contact information for after-discharge care    [facility] .   Service:  Skilled Nursing Contact information: [address] [place] [phone]                The patient has been discharged on:   1.Beta Blocker:  Yes [ y  ]                              No   [   ]                              If No, reason:  2.Ace Inhibitor/ARB: Yes [   ]                                     No  [ n   ]                                     If No, reason:low bp  3.Statin:   Yes [ y  ]                  No  [   ]                  If No,  reason:  4.Ecasa:  Yes  [ y  ]                  No   [   ]                  If No, reason:  1. Please obtain vital signs at least one time daily 2.Please weigh the patient daily. If he or she continues to gain weight or develops lower extremity edema, contact the office at [phone]. 3. Ambulate patient at least three times daily and please use sternal precautions.  Signed: John Giovanni 09/05/2017, 8:13 AM

## 2017-09-04 NOTE — Progress Notes (Signed)
Physical Therapy Treatment Patient Details Name: Norma [name] MRN: [id number] DOB: 05-Apr-1951 Today's Date: 09/04/2017    History of Present Illness 68 yo admitted with [place] s/p CABG x 3. PMHx: bipolar, DM, HTN, CAD, left parietal CVA Jan 2019, LBBB, AS, neuropathy    PT Comments    Pt with improved affect despite not being a fan of mornings. Pt with increased ability with transfers and gait today. Pt only able to recall staying in "tunnel" and educated for all precautions, transfers, gait and functional mobility. Will continue to follow and educated for seated HEP to increase strength and function as well. Encouraged continued ambulation and HEp with nursing throughout.   115/48 HR 79 SpO2 89-95% on RA    Follow Up Recommendations  SNF;Supervision/Assistance - 24 hour     Equipment Recommendations  Rolling walker with 5" wheels    Recommendations for Other Services       Precautions / Restrictions Precautions Precautions: Fall;Sternal Precaution Comments: Reviewed sternal precautions. Pt unable to recall precautions    Mobility  Bed Mobility Overal bed mobility: Needs Assistance Bed Mobility: Rolling;Sidelying to Sit Rolling: Supervision Sidelying to sit: Min assist       General bed mobility comments: cues to roll with pt holding heart pillow, min assist to elevate trunk from surface  Transfers Overall transfer level: Needs assistance   Transfers: Sit to/from Stand Sit to Stand: Min guard         General transfer comment: cues for hands on thighs  Ambulation/Gait Ambulation/Gait assistance: Min guard Ambulation Distance (Feet): 120 Feet Assistive device: Rolling walker (2 wheeled) Gait Pattern/deviations: Step-through pattern;Decreased stride length   Gait velocity interpretation: 1.31 - 2.62 ft/sec, indicative of limited community ambulator General Gait Details: cues for posture, looking up and position in RW, pt able to self-regulate distance this  session   Stairs             Wheelchair Mobility    Modified Rankin (Stroke Patients Only)       Balance Overall balance assessment: Needs assistance   Sitting balance-Leahy Scale: Good       Standing balance-Leahy Scale: Fair                              Cognition Arousal/Alertness: Awake/alert Behavior During Therapy: WFL for tasks assessed/performed Overall Cognitive Status: Impaired/Different from baseline Area of Impairment: Memory                     Memory: Decreased recall of precautions                Exercises General Exercises - Lower Extremity Long Arc Quad: AROM;15 reps;Seated;Both Hip Flexion/Marching: AROM;15 reps;Seated;Both    General Comments        Pertinent Vitals/Pain Pain Assessment: 0-10 Pain Score: 8  Pain Location: left shoulder Pain Descriptors / Indicators: Aching;Grimacing Pain Intervention(s): Limited activity within patient's tolerance;Repositioned    Home Living                      Prior Function            PT Goals (current goals can now be found in the care plan section) Progress towards PT goals: Progressing toward goals    Frequency    Min 3X/week      PT Plan Current plan remains appropriate    Co-evaluation  AM-PAC PT "6 Clicks" Daily Activity  Outcome Measure  Difficulty turning over in bed (including adjusting bedclothes, sheets and blankets)?: A Lot Difficulty moving from lying on back to sitting on the side of the bed? : Unable   Help needed moving to and from a bed to chair (including a wheelchair)?: A Little Help needed walking in hospital room?: A Little Help needed climbing 3-5 steps with a railing? : A Lot 6 Click Score: 11    End of Session Equipment Utilized During Treatment: Gait belt Activity Tolerance: Patient tolerated treatment well Patient left: in chair;with call bell/phone within reach Nurse Communication: Mobility  status;Precautions PT Visit Diagnosis: Other abnormalities of gait and mobility (R26.89);Muscle weakness (generalized) (M62.81)     Time: 3343-5686 PT Time Calculation (min) (ACUTE ONLY): 26 min  Charges:  $Gait Training: 8-22 mins $Therapeutic Exercise: 8-22 mins                    G Codes:       Elwyn Reach, PT [phone]    [place] 09/04/2017, 8:31 AM

## 2017-09-04 NOTE — Progress Notes (Signed)
1320-1410 Offered to walk with pt but she declined at this time. Encouraged her to walk with staff later. Education completed with pt who voiced understanding. Reviewed sternal precautions and staying in the tube. Moved  IS closer to pt and encouraged 10 x an hour. Gave pt diabetic and heart healthy diets and encouraged carb counting. Encouraged pt to view discharge and diabetic videos.  Encouraged walking as tolerated. Discussed CRP 2 and will refer to [facility] program. Graylon Good RN BSN 09/04/2017 2:12 PM

## 2017-09-04 NOTE — Progress Notes (Signed)
Pt placed on Cpap for night time rest tolerating well

## 2017-09-04 NOTE — Care Management Important Message (Signed)
Important Message  Patient Details  Name: ELIS RAWLINSON MRN: [id number] Date of Birth: March 01, 1951   Medicare Important Message Given:  Yes    Barb Merino Chozen Latulippe 09/04/2017, 1:52 PM

## 2017-09-04 NOTE — Progress Notes (Addendum)
      [address]       [place] [address]             [phone]      6 Days Post-Op Procedure(s) (LRB): CORONARY ARTERY BYPASS GRAFTING (CABG) x 3 WITH ENDOSCOPIC HARVESTING OF RIGHT SAPHENOUS VEIN (N/A) TRANSESOPHAGEAL ECHOCARDIOGRAM (TEE) (N/A) Subjective: Feels okay. She states that she cannot use her CPAP due to congestion.   Objective: Vital signs in last 24 hours: Temp:  [97.8 F (36.6 C)-98.7 F (37.1 C)] 97.8 F (36.6 C) (05/23 0338) Pulse Rate:  [63-75] 71 (05/23 0338) Cardiac Rhythm: Normal sinus rhythm;Bundle branch block;Heart block (05/23 0700) Resp:  [15-25] 25 (05/23 0338) BP: (100-130)/(44-85) 119/44 (05/23 0338) SpO2:  [91 %-99 %] 99 % (05/23 0338) Weight:  [261 lb 1.6 oz (118.4 kg)] 261 lb 1.6 oz (118.4 kg) (05/23 0600)     Intake/Output from previous day: 05/22 0701 - 05/23 0700 In: 840 [P.O.:840] Out: 1200 [Urine:1200] Intake/Output this shift: No intake/output data recorded.  General appearance: alert, cooperative and no distress Heart: regular rate and rhythm, S1, S2 normal, no murmur, click, rub or gallop Lungs: clear to auscultation bilaterally Abdomen: soft, non-tender; bowel sounds normal; no masses,  no organomegaly Extremities: extremities normal, atraumatic, no cyanosis or edema Wound: clean and dry  Lab Results: Recent Labs    09/03/17 0457 09/04/17 0213  WBC 13.7* 14.2*  HGB 8.6* 8.9*  HCT 26.7* 28.0*  PLT 272 286   BMET:  Recent Labs    09/03/17 0457 09/04/17 0213  NA 140 139  K 4.3 4.2  CL 104 101  CO2 27 29  GLUCOSE 158* 129*  BUN 16 14  CREATININE 0.89 0.87  CALCIUM 8.5* 8.9    PT/INR: No results for input(s): LABPROT, INR in the last 72 hours. ABG    Component Value Date/Time   PHART 7.452 (H) 08/31/2017 1752   HCO3 24.6 08/31/2017 1752   TCO2 26 09/02/2017 1543   ACIDBASEDEF 2.0 08/31/2017 0338   O2SAT 61.3 09/01/2017 0430   CBG (last 3)  Recent Labs    09/03/17 0745  09/03/17 1132 09/03/17 2101  GLUCAP 147* 145* 214*    Assessment/Plan: S/P Procedure(s) (LRB): CORONARY ARTERY BYPASS GRAFTING (CABG) x 3 WITH ENDOSCOPIC HARVESTING OF RIGHT SAPHENOUS VEIN (N/A) TRANSESOPHAGEAL ECHOCARDIOGRAM (TEE) (N/A)   1. CV-NSR in the 60s-70s. BP low at times. Amio 200mg  BID. Continue ASA and not taking a statin. 2. Pulm-CXR stable, on room air with good oxygen saturation 3. Renal-creatinine 0.87, electrolytes okay. Continue IV diuretics for fluid overload.  4. H and H 8.9/28 this morning. Platelets 286k 5. Blood glucose well controlled 6. CIR consult for chronic debility-they recommend SNF and the patient is requesting [facility] in [place].  7. She was on Claritin D for her allergies. She is requesting to be put back on that medication.    Plan: Should be ready for SNF by Friday.    LOS: 8 days    Elgie Collard 09/04/2017  patient examined and medical record reviewed,agree with above note. Tharon Aquas Trigt III 09/05/2017

## 2017-09-05 DIAGNOSIS — E119 Type 2 diabetes mellitus without complications: Secondary | ICD-10-CM | POA: Diagnosis not present

## 2017-09-05 DIAGNOSIS — F319 Bipolar disorder, unspecified: Secondary | ICD-10-CM | POA: Diagnosis not present

## 2017-09-05 DIAGNOSIS — E038 Other specified hypothyroidism: Secondary | ICD-10-CM | POA: Diagnosis not present

## 2017-09-05 DIAGNOSIS — Z8673 Personal history of transient ischemic attack (TIA), and cerebral infarction without residual deficits: Secondary | ICD-10-CM | POA: Diagnosis not present

## 2017-09-05 DIAGNOSIS — I63132 Cerebral infarction due to embolism of left carotid artery: Secondary | ICD-10-CM | POA: Diagnosis not present

## 2017-09-05 DIAGNOSIS — E782 Mixed hyperlipidemia: Secondary | ICD-10-CM | POA: Diagnosis not present

## 2017-09-05 DIAGNOSIS — E0590 Thyrotoxicosis, unspecified without thyrotoxic crisis or storm: Secondary | ICD-10-CM | POA: Diagnosis not present

## 2017-09-05 DIAGNOSIS — I25118 Atherosclerotic heart disease of native coronary artery with other forms of angina pectoris: Secondary | ICD-10-CM | POA: Diagnosis not present

## 2017-09-05 DIAGNOSIS — E785 Hyperlipidemia, unspecified: Secondary | ICD-10-CM | POA: Diagnosis not present

## 2017-09-05 DIAGNOSIS — G4700 Insomnia, unspecified: Secondary | ICD-10-CM | POA: Diagnosis not present

## 2017-09-05 DIAGNOSIS — Z881 Allergy status to other antibiotic agents status: Secondary | ICD-10-CM | POA: Diagnosis not present

## 2017-09-05 DIAGNOSIS — M6281 Muscle weakness (generalized): Secondary | ICD-10-CM | POA: Diagnosis not present

## 2017-09-05 DIAGNOSIS — R251 Tremor, unspecified: Secondary | ICD-10-CM | POA: Diagnosis not present

## 2017-09-05 DIAGNOSIS — E7849 Other hyperlipidemia: Secondary | ICD-10-CM | POA: Diagnosis not present

## 2017-09-05 DIAGNOSIS — G2581 Restless legs syndrome: Secondary | ICD-10-CM | POA: Diagnosis not present

## 2017-09-05 DIAGNOSIS — I2510 Atherosclerotic heart disease of native coronary artery without angina pectoris: Secondary | ICD-10-CM | POA: Diagnosis not present

## 2017-09-05 DIAGNOSIS — E876 Hypokalemia: Secondary | ICD-10-CM | POA: Diagnosis not present

## 2017-09-05 DIAGNOSIS — G4733 Obstructive sleep apnea (adult) (pediatric): Secondary | ICD-10-CM | POA: Diagnosis not present

## 2017-09-05 DIAGNOSIS — R413 Other amnesia: Secondary | ICD-10-CM | POA: Diagnosis not present

## 2017-09-05 DIAGNOSIS — E1159 Type 2 diabetes mellitus with other circulatory complications: Secondary | ICD-10-CM | POA: Diagnosis not present

## 2017-09-05 DIAGNOSIS — K219 Gastro-esophageal reflux disease without esophagitis: Secondary | ICD-10-CM | POA: Diagnosis not present

## 2017-09-05 DIAGNOSIS — I10 Essential (primary) hypertension: Secondary | ICD-10-CM | POA: Diagnosis not present

## 2017-09-05 DIAGNOSIS — Z48812 Encounter for surgical aftercare following surgery on the circulatory system: Secondary | ICD-10-CM | POA: Diagnosis not present

## 2017-09-05 LAB — COMPREHENSIVE METABOLIC PANEL
ALT: 14 U/L (ref 14–54)
AST: 12 U/L — ABNORMAL LOW (ref 15–41)
Albumin: 3 g/dL — ABNORMAL LOW (ref 3.5–5.0)
Alkaline Phosphatase: 53 U/L (ref 38–126)
Anion gap: 8 (ref 5–15)
BUN: 11 mg/dL (ref 6–20)
CO2: 30 mmol/L (ref 22–32)
Calcium: 8.9 mg/dL (ref 8.9–10.3)
Chloride: 101 mmol/L (ref 101–111)
Creatinine, Ser: 0.87 mg/dL (ref 0.44–1.00)
GFR calc Af Amer: >60 mL/min (ref >60)
GFR calc non Af Amer: >60 mL/min (ref >60)
Glucose, Bld: 134 mg/dL — ABNORMAL HIGH (ref 65–99)
Potassium: 4.9 mmol/L (ref 3.5–5.1)
Sodium: 139 mmol/L (ref 135–145)
Total Bilirubin: 0.6 mg/dL (ref 0.3–1.2)
Total Protein: 6.1 g/dL — ABNORMAL LOW (ref 6.5–8.1)

## 2017-09-05 LAB — CBC
HCT: 27.8 % — ABNORMAL LOW (ref 36.0–46.0)
Hemoglobin: 8.8 g/dL — ABNORMAL LOW (ref 12.0–15.0)
MCH: 28.6 pg (ref 26.0–34.0)
MCHC: 31.7 g/dL (ref 30.0–36.0)
MCV: 90.3 fL (ref 78.0–100.0)
Platelets: 190 10*3/uL (ref 150–400)
RBC: 3.08 MIL/uL — ABNORMAL LOW (ref 3.87–5.11)
RDW: 15.7 % — ABNORMAL HIGH (ref 11.5–15.5)
WBC: 15.4 10*3/uL — ABNORMAL HIGH (ref 4.0–10.5)

## 2017-09-05 LAB — GLUCOSE, CAPILLARY
Glucose-Capillary: 140 mg/dL — ABNORMAL HIGH (ref 65–99)
Glucose-Capillary: 173 mg/dL — ABNORMAL HIGH (ref 65–99)

## 2017-09-05 MED ORDER — POTASSIUM CHLORIDE CRYS ER 20 MEQ PO TBCR: 20.0000 meq | EXTENDED_RELEASE_TABLET | Freq: Every day | ORAL | 0 refills | Status: DC

## 2017-09-05 MED ORDER — TRAMADOL HCL 50 MG PO TABS: 50.0000 mg | ORAL_TABLET | Freq: Four times a day (QID) | ORAL | 0 refills | Status: DC | PRN

## 2017-09-05 MED ORDER — METOPROLOL TARTRATE 25 MG PO TABS: 12.5000 mg | ORAL_TABLET | Freq: Two times a day (BID) | ORAL | Status: DC

## 2017-09-05 MED ORDER — AMIODARONE HCL 200 MG PO TABS: 200.0000 mg | ORAL_TABLET | Freq: Two times a day (BID) | ORAL | Status: DC

## 2017-09-05 MED ORDER — GABAPENTIN 300 MG PO CAPS: 300.0000 mg | ORAL_CAPSULE | Freq: Two times a day (BID) | ORAL | Status: DC

## 2017-09-05 MED ORDER — ATORVASTATIN CALCIUM 10 MG PO TABS
10.0000 mg | ORAL_TABLET | Freq: Every day | ORAL | Status: DC
  Administered 2017-09-05: 10 mg via ORAL
  Filled 2017-09-05: qty 1

## 2017-09-05 MED ORDER — FUROSEMIDE 40 MG PO TABS: 40.0000 mg | ORAL_TABLET | Freq: Every day | ORAL | Status: DC

## 2017-09-05 MED ORDER — ATORVASTATIN CALCIUM 10 MG PO TABS: 10.0000 mg | ORAL_TABLET | Freq: Every day | ORAL | Status: DC

## 2017-09-05 MED ORDER — MUPIROCIN CALCIUM 2 % EX CREA: TOPICAL_CREAM | Freq: Two times a day (BID) | CUTANEOUS | 0 refills | Status: DC

## 2017-09-05 MED ORDER — ASPIRIN 325 MG PO TBEC: 325.0000 mg | DELAYED_RELEASE_TABLET | Freq: Every day | ORAL | 0 refills | Status: DC

## 2017-09-05 MED ORDER — TRAMADOL HCL 50 MG PO TABS: 50.0000 mg | ORAL_TABLET | Freq: Four times a day (QID) | ORAL | 0 refills | Status: AC | PRN

## 2017-09-05 NOTE — Progress Notes (Signed)
DC delayed d/t patient awaiting ride from family members to facility. DC instructions given to patient. Sutures DC from previous chest tubes, no dressing needed. VSS. Ordered second abdominal binder to send with patient. PIV DC, hemostasis achieved. Continue to monitor patient until time of DC.

## 2017-09-05 NOTE — Progress Notes (Signed)
Printed prescription given to pt's mother. Still waiting on abd binder.  [place]  Maisee Vollman, RN

## 2017-09-05 NOTE — Progress Notes (Signed)
Reported to Theophilus Bones at [facility]. Waiting on prescription to print & abdominal binder to arrive on unit. Ride in room. Will wheel pt out once ready.   [place]  Paxtyn Boyar, RN

## 2017-09-05 NOTE — Plan of Care (Signed)
Pt. Is progressing. Continue with plan of care. 

## 2017-09-05 NOTE — Progress Notes (Signed)
Physical Therapy Treatment Patient Details Name: Norma [name] MRN: [id number] DOB: 1950/07/31 Today's Date: 09/05/2017    History of Present Illness 67 yo admitted with [place] s/p CABG x 3. PMHx: bipolar, DM, HTN, CAD, left parietal CVA Jan 2019, LBBB, AS, neuropathy    PT Comments    Pt pleasant throughout session and willing to participate with therapy. Session focused on precaution review, gait and exercise tolerance, and LE therEx. Pt has difficulty with remembering precautions and requires cueing for hand placement with transfers. Pt demonstrating improved mobility as she is min guard for all transfers and gait. Pt did become SOB with ambulation but recovered quickly after resting.    Follow Up Recommendations  SNF;Supervision/Assistance - 24 hour     Equipment Recommendations  Rolling walker with 5" wheels    Recommendations for Other Services       Precautions / Restrictions Precautions Precautions: Fall;Sternal Precaution Booklet Issued: Yes (comment) Precaution Comments: Reviewed sternal precautions. Pt able to recall 2 at beginning and end of session. Educated for all with additional handout provided    Mobility  Bed Mobility               General bed mobility comments: standing in room on arrival  Transfers Overall transfer level: Needs assistance   Transfers: Sit to/from Stand Sit to Stand: Min guard         General transfer comment: cues for hands on thighs  Ambulation/Gait Ambulation/Gait assistance: Min guard Ambulation Distance (Feet): 140 Feet Assistive device: Rolling walker (2 wheeled) Gait Pattern/deviations: Step-through pattern;Decreased stride length   Gait velocity interpretation: 1.31 - 2.62 ft/sec, indicative of limited community ambulator General Gait Details: cues for posture, looking up and position in RW, pt able to self-regulate distance this session, pt with 2/4 dyspnea with SpO2 maintained above 90%   Stairs              Wheelchair Mobility    Modified Rankin (Stroke Patients Only)       Balance Overall balance assessment: Needs assistance   Sitting balance-Leahy Scale: Good       Standing balance-Leahy Scale: Fair                              Cognition Arousal/Alertness: Awake/alert Behavior During Therapy: WFL for tasks assessed/performed Overall Cognitive Status: Impaired/Different from baseline Area of Impairment: Memory                     Memory: Decreased recall of precautions                Exercises General Exercises - Lower Extremity Ankle Circles/Pumps: AROM;15 reps;Seated;Both Long Arc Quad: AROM;15 reps;Seated;Both Hip Flexion/Marching: AROM;15 reps;Seated;Both    General Comments        Pertinent Vitals/Pain Pain Assessment: No/denies pain    Home Living                      Prior Function            PT Goals (current goals can now be found in the care plan section) Progress towards PT goals: Progressing toward goals    Frequency           PT Plan Current plan remains appropriate    Co-evaluation              AM-PAC PT "6 Clicks" Daily Activity  Outcome Measure  Difficulty  turning over in bed (including adjusting bedclothes, sheets and blankets)?: A Lot Difficulty moving from lying on back to sitting on the side of the bed? : Unable Difficulty sitting down on and standing up from a chair with arms (e.g., wheelchair, bedside commode, etc,.)?: A Lot Help needed moving to and from a bed to chair (including a wheelchair)?: A Little Help needed walking in hospital room?: A Little Help needed climbing 3-5 steps with a railing? : A Lot 6 Click Score: 13    End of Session Equipment Utilized During Treatment: Gait belt Activity Tolerance: Patient tolerated treatment well Patient left: in chair;with call bell/phone within reach Nurse Communication: Mobility status;Precautions PT Visit Diagnosis: Other  abnormalities of gait and mobility (R26.89);Muscle weakness (generalized) (M62.81)     Time: 8675-4492 PT Time Calculation (min) (ACUTE ONLY): 25 min  Charges:  $Gait Training: 8-22 mins $Therapeutic Exercise: 8-22 mins                    G Codes:      Gabe Natalya Domzalski, SPT  [employer] 09/05/2017, 9:51 AM

## 2017-09-05 NOTE — Consult Note (Signed)
            [facility] CM Primary Care Navigator  09/05/2017  Norma [name] [birth year] [id number]   Went back to see patient after transfer out from ICU to identify possible discharge needs. Patientreports having"chest pain, shortness of breath and lower extremity swelling" thatresultedtothis admission.(unstable angina status post CABG- coronary artery bypass graft x 3)  PatientconfirmsDr. Sinda Du with [facility] as herprimary care provider.    [facility] [place] [facility] without[facility]y so far.Patient states that she gets sample medications from his provider if she reaches the "gap/ donut hole". Patient made aware to seek referral to [facility] from primary care provider for medication assistance needed in the future.  Patientreports thatshehas beenmanagingher medications at [employer] use of "pill box"  filled once a week.  Patientverbalized thatshe was driving prior to admission/ surgery butsister Juliann Pulse- lives nearby), nephew Audry Pili) or her parents will be providing transportationto herdoctors'appointmentsafter discharge.  Patientmentionedthatshe lives at home alone, but her sister or her mother will be able to provide assistance when needed. Patient mentioned that great nephew Karie Soda) has agreed to stay with her to assist with any needs at home after discharge.  Anticipateddischargeplan is skilled nursing facility (SNF- [facility])per therapy recommendation.  Patientvoiced understanding to call primary care provider's office whenshegetsback home, for a post discharge follow-up visit within1- 2weeksor sooner if needs arise. Patient letter (with PCP's contact number) was provided asareminder.   Discussed with patientregarding [facility] CM services available for health managementat home and patientvoiced interest about it. She had expressed understandingto seekreferral  from primary care provider to [facility] care management ifdeemed necessary and appropriatefor services in the nearfuture- onceshe returns back home.  [facility] care management information was provided for future needs thatshemay have.  Primary care provider's office is listed as providing transition of care (TOC) follow-up.   For additional questions please contact:  Edwena Felty A. Riku Buttery, BSN, RN-BC [facility] PRIMARY CARE Navigator Cell: [phone]

## 2017-09-05 NOTE — Progress Notes (Signed)
Pt requested documentation of BG's, insulin coverage, and A1C from past few days--given; pt wheeled out to vehicle by NT.   [place]  Samari Gorby, RN

## 2017-09-05 NOTE — Progress Notes (Signed)
[address]       [place] [address]             [phone]      7 Days Post-Op Procedure(s) (LRB): CORONARY ARTERY BYPASS GRAFTING (CABG) x 3 WITH ENDOSCOPIC HARVESTING OF RIGHT SAPHENOUS VEIN (N/A) TRANSESOPHAGEAL ECHOCARDIOGRAM (TEE) (N/A) Subjective: Feels well, some shoulder discomfort  Objective: Vital signs in last 24 hours: Temp:  [97.7 F (36.5 C)-98.5 F (36.9 C)] 98 F (36.7 C) (05/24 0350) Pulse Rate:  [70-77] 70 (05/24 0350) Cardiac Rhythm: Normal sinus rhythm (05/24 0300) Resp:  [16-27] 21 (05/24 0350) BP: (95-135)/(46-82) 135/82 (05/24 0350) SpO2:  [89 %-96 %] 94 % (05/24 0745) Weight:  [117.7 kg (259 lb 7.7 oz)] 117.7 kg (259 lb 7.7 oz) (05/24 0604)  Hemodynamic parameters for last 24 hours:    Intake/Output from previous day: 05/23 0701 - 05/24 0700 In: 5009 [P.O.:1560; I.V.:23] Out: 1400 [Urine:1400] Intake/Output this shift: No intake/output data recorded.  General appearance: alert, cooperative and no distress Heart: regular rate and rhythm Lungs: clear to auscultation bilaterally Abdomen: benign Extremities: + edema Wound: incis healing well  Lab Results: Recent Labs    09/04/17 0213 09/05/17 0332  WBC 14.2* 15.4*  HGB 8.9* 8.8*  HCT 28.0* 27.8*  PLT 286 190   BMET:  Recent Labs    09/04/17 0213 09/05/17 0332  NA 139 139  K 4.2 4.9  CL 101 101  CO2 29 30  GLUCOSE 129* 134*  BUN 14 11  CREATININE 0.87 0.87  CALCIUM 8.9 8.9    PT/INR: No results for input(s): LABPROT, INR in the last 72 hours. ABG    Component Value Date/Time   PHART 7.452 (H) 08/31/2017 1752   HCO3 24.6 08/31/2017 1752   TCO2 26 09/02/2017 1543   ACIDBASEDEF 2.0 08/31/2017 0338   O2SAT 61.3 09/01/2017 0430   CBG (last 3)  Recent Labs    09/04/17 1246 09/04/17 1730 09/04/17 2055  GLUCAP 150* 136* 194*    Meds Scheduled Meds: . amiodarone  200 mg Oral BID  . aspirin EC  325 mg Oral Daily   Or  . aspirin  324 mg Per  Tube Daily  . bisacodyl  10 mg Oral Daily   Or  . bisacodyl  10 mg Rectal Daily  . buPROPion  300 mg Oral q morning - 10a  . docusate sodium  200 mg Oral Daily  . enoxaparin (LOVENOX) injection  30 mg Subcutaneous QHS  . furosemide  40 mg Intravenous Daily  . gabapentin  300 mg Oral BID  . insulin aspart  0-20 Units Subcutaneous TID WC  . insulin aspart  0-5 Units Subcutaneous QHS  . insulin detemir  24 Units Subcutaneous Daily  . lamoTRIgine  150 mg Oral QHS  . levalbuterol  1.25 mg Nebulization TID  . levothyroxine  100 mcg Oral QAC breakfast  . loratadine  10 mg Oral Daily  . metoprolol tartrate  12.5 mg Oral BID   Or  . metoprolol tartrate  12.5 mg Per Tube BID  . mupirocin cream   Topical BID  . mupirocin ointment   Nasal BID  . pantoprazole  40 mg Oral Daily  . potassium chloride  40 mEq Oral BID  . rOPINIRole  2 mg Oral Daily   And  . rOPINIRole  4 mg Oral QHS  . sodium chloride flush  10-40 mL Intracatheter Q12H  . sodium chloride flush  3 mL Intravenous  Q12H  . venlafaxine XR  300 mg Oral QHS   Continuous Infusions: PRN Meds:.metoprolol tartrate, ondansetron (ZOFRAN) IV, potassium chloride, sodium chloride flush, sodium chloride flush, traMADol  Xrays No results found.  Assessment/Plan: S/P Procedure(s) (LRB): CORONARY ARTERY BYPASS GRAFTING (CABG) x 3 WITH ENDOSCOPIC HARVESTING OF RIGHT SAPHENOUS VEIN (N/A) TRANSESOPHAGEAL ECHOCARDIOGRAM (TEE) (N/A)  1 stable overall 2 hemodyn stable in sinus rhythm 3 labs stable, slightly increased WBC from 14 to 15K, was as high as 31 K 4 no fevers, incis healing well 5 cont diuresis  6 appears stable for SNF     LOS: 9 days    John Giovanni 09/05/2017

## 2017-09-05 NOTE — Progress Notes (Signed)
Offered Pt a bath. Pt request to wait to see if she can get in shower.

## 2017-09-05 NOTE — Clinical Social Work Note (Addendum)
PASARR obtained: [id number] E. Insurance authorization still pending.  Dayton Scrape, [place] [phone]  8:29 am Authorization approved: [id number].  Dayton Scrape, [place]

## 2017-09-05 NOTE — Clinical Social Work Note (Signed)
CSW facilitated patient discharge including contacting facility to confirm patient discharge plans. Patient will contact her parents after attending MD comes to see her. Clinical information faxed to facility and family agreeable with plan. Patient's parents will transport by car to [facility]. RN to call report prior to discharge [phone]).  CSW will sign off for now as social work intervention is no longer needed. Please consult [place] again if new needs arise.  Dayton Scrape, [place]

## 2017-09-05 NOTE — Clinical Social Work Placement (Signed)
   CLINICAL SOCIAL WORK PLACEMENT  NOTE  Date:  09/05/2017  Patient Details  Name: Norma [name] MRN: [id number] Date of Birth: 05-28-1950  Clinical Social Work is seeking post-discharge placement for this patient at the [place] level of care (*CSW will initial, date and re-position this form in  chart as items are completed):  Yes   Patient/family provided with [facility] Work Department's list of facilities offering this level of care within the geographic area requested by the patient (or if unable, by the patient's family).  Yes   Patient/family informed of their freedom to choose among providers that offer the needed level of care, that participate in Medicare, Medicaid or managed care program needed by the patient, have an available bed and are willing to accept the patient.  Yes   Patient/family informed of [facility]'s ownership interest in [facility] and [facility], as well as of the fact that they are under no obligation to receive care at these facilities.  PASRR submitted to EDS on 09/01/17     PASRR number received on 09/05/17     Existing PASRR number confirmed on       FL2 transmitted to all facilities in geographic area requested by pt/family on 09/01/17     FL2 transmitted to all facilities within larger geographic area on       Patient informed that his/her managed care company has contracts with or will negotiate with certain facilities, including the following:        Yes   Patient/family informed of bed offers received.  Patient chooses bed at [facility])     Physician recommends and patient chooses bed at      Patient to be transferred to [facility]) on 09/05/17.  Patient to be transferred to facility by Parents will transport by car     Patient family notified on 09/05/17 of transfer.  Name of family member notified:        PHYSICIAN Please  prepare prescriptions     Additional Comment:    _______________________________________________ Candie Chroman, LCSW 09/05/2017, 8:57 AM

## 2017-09-11 ENCOUNTER — Encounter: Payer: Self-pay | Admitting: "Endocrinology

## 2017-09-11 ENCOUNTER — Ambulatory Visit (INDEPENDENT_AMBULATORY_CARE_PROVIDER_SITE_OTHER): Payer: PPO | Admitting: "Endocrinology

## 2017-09-11 VITALS — BP 103/56 | HR 70 | Ht 63.0 in | Wt 260.0 lb

## 2017-09-11 DIAGNOSIS — E782 Mixed hyperlipidemia: Secondary | ICD-10-CM | POA: Insufficient documentation

## 2017-09-11 DIAGNOSIS — I10 Essential (primary) hypertension: Secondary | ICD-10-CM

## 2017-09-11 DIAGNOSIS — E1159 Type 2 diabetes mellitus with other circulatory complications: Secondary | ICD-10-CM

## 2017-09-11 DIAGNOSIS — E038 Other specified hypothyroidism: Secondary | ICD-10-CM | POA: Diagnosis not present

## 2017-09-11 MED ORDER — INSULIN GLARGINE 300 UNIT/ML ~~LOC~~ SOPN: 40.0000 [IU] | PEN_INJECTOR | Freq: Every day | SUBCUTANEOUS | 2 refills | Status: DC

## 2017-09-11 NOTE — Patient Instructions (Signed)

## 2017-09-11 NOTE — Progress Notes (Signed)
Subjective:    Patient ID: Norma [name], female    DOB: [birth year],    Past Medical History:  Diagnosis Date  . Anemia   . Anxiety   . Bipolar disorder ([place])   . Bulging lumbar disc    L3-4  . Chronic daily headache   . Chronic low back pain 09/20/2014  . COPD (chronic obstructive pulmonary disease) ([place])   . Degenerative arthritis   . Depression   . Diabetes mellitus, type II ([place])   . DM type 2 with diabetic peripheral neuropathy ([place]) 09/20/2014  . Dyslipidemia   . Dyspnea   . Gastroparesis   . GERD (gastroesophageal reflux disease)   . Heart murmur   . History of hiatal hernia   . Hypothyroidism   . IBS (irritable bowel syndrome)   . Memory difficulty 09/20/2014  . Morbid obesity ([place])   . Neuropathy   . Obstructive sleep apnea on CPAP   . Restless legs syndrome (RLS) 09/17/2012  . Stroke (cerebrum) ([place]) 05/16/2017   Left parietal   Past Surgical History:  Procedure Laterality Date  . ABDOMINAL HYSTERECTOMY    . ARTHROSCOPY KNEE W/ DRILLING  06/2011   and Dece[place] of 2013.  [name] Carpel tunnel  1980's  . CATARACT EXTRACTION W/PHACO Right 03/21/2017   Procedure: CATARACT EXTRACTION PHACO AND INTRAOCULAR LENS PLACEMENT RIGHT EYE;  Surgeon: Baruch Goldmann, MD;  Location: [facility];  Service: Ophthalmology;  Laterality: Right;  CDE: 2.91   . CATARACT EXTRACTION W/PHACO Left 04/18/2017   Procedure: CATARACT EXTRACTION PHACO AND INTRAOCULAR LENS PLACEMENT LEFT EYE;  Surgeon: Baruch Goldmann, MD;  Location: [facility];  Service: Ophthalmology;  Laterality: Left;  left  . CHOLECYSTECTOMY    . COLONOSCOPY WITH PROPOFOL N/A 10/04/2016   Procedure: COLONOSCOPY WITH PROPOFOL;  Surgeon: Rogene Houston, MD;  [facility]: [facility];  Service: Endoscopy;  Laterality: N/A;  11:10  . CORONARY ARTERY BYPASS GRAFT N/A 08/29/2017   Procedure: CORONARY ARTERY BYPASS GRAFTING (CABG) x 3 WITH ENDOSCOPIC HARVESTING OF RIGHT SAPHENOUS VEIN;  Surgeon: Ivin Poot, MD;  Location: [facility];  Service:  Open Heart Surgery;  Laterality: N/A;  . ESOPHAGOGASTRODUODENOSCOPY N/A 04/25/2016   Procedure: ESOPHAGOGASTRODUODENOSCOPY (EGD);  Surgeon: Rogene Houston, MD;  [facility];  Service: Endoscopy;  Laterality: N/A;  730  . LEFT HEART CATH AND CORONARY ANGIOGRAPHY N/A 08/27/2017   Procedure: LEFT HEART CATH AND CORONARY ANGIOGRAPHY;  Surgeon: Sherren Mocha, MD;  Location: [facility];  Service: Cardiovascular::  pLAD 95% - p-mLAD 50%, ostD1 90% -pD1 80%; ostOM1 90%; rPDA 80%. EF ~50-55% - HK of dital Anterolateral & Apical wall.  - Rec CVTS c/s  . RECTAL SURGERY     fissure  . SHOULDER SURGERY Left    arthroscopy in March of this year  . TEE WITHOUT CARDIOVERSION N/A 08/29/2017   Procedure: TRANSESOPHAGEAL ECHOCARDIOGRAM (TEE);  Surgeon: Prescott Gum, Collier Salina, MD;  Location: [facility];  Service: Open Heart Surgery;  Laterality: N/A;  . TRANSTHORACIC ECHOCARDIOGRAM  06/06/2017   Mild to moderate reduced EF 40 and 45%.  Anterior septal, inferoseptal and basal to mid inferior hypokinesis.  GR 1 DD.  No significant valvular lesion   Social History   Socioeconomic History  . Marital status: Single    Spouse name: Not on file  . Number of children: 0  . Years of education: 30  . Highest education level: Not on file  Occupational History    Employer: [employer]  Social Needs  . Financial resource strain: Not on file  . Food insecurity:    Worry: Not on file    Inability: Not on file  . Transportation needs:    Medical: Not on file    Non-medical: Not on file  Tobacco Use  . Smoking status: Former Smoker    Types: Cigarettes    Last attempt to quit: 02/04/1971    Years since quitting: 46.6  . Smokeless tobacco: Never Used  . Tobacco comment: smoked 2 cigarettes a day  Substance and Sexual Activity  . Alcohol use: No    Alcohol/week: 0.0 oz  . Drug use: No  . Sexual activity: Never  Lifestyle  . Physical activity:    Days per week: Not on file    Minutes per  session: Not on file  . Stress: Not on file  Relationships  . Social connections:    Talks on phone: Not on file    Gets together: Not on file    Attends religious service: Not on file    Active member of club or organization: Not on file    Attends meetings of clubs or organizations: Not on file    Relationship status: Not on file  Other Topics Concern  . Not on file  Social History Narrative   Patient lives at home alone.    Patient has no children.    Patient has her masters in nursing.    Patient is single.    Patient drinks about 2 glasses of tea daily.   Patient is right handed.   Outpatient Encounter Medications as of 09/11/2017  Medication Sig  . amiodarone (PACERONE) 200 MG tablet Take 1 tablet (200 mg total) by mouth 2 (two) times daily.  [name] aspirin EC 325 MG EC tablet Take 1 tablet (325 mg total) by mouth daily.  [name] atorvastatin (LIPITOR) 10 MG tablet Take 1 tablet (10 mg total) by mouth daily at 6 PM.  . baclofen (LIORESAL) 10 MG tablet TAKE ONE TABLET BY MOUTH AT BEDTIME.  [name] buPROPion (WELLBUTRIN XL) 300 MG 24 hr tablet TAKE ONE TABLET BY MOUTH EVERY MORNING.  . cetirizine (ZYRTEC) 10 MG tablet Take 10 mg by mouth daily.  [name] esomeprazole (NEXIUM) 40 MG capsule Take 40 mg by mouth daily.   . furosemide (LASIX) 40 MG tablet Take 1 tablet (40 mg total) by mouth daily for 10 days.  [name] gabapentin (NEURONTIN) 300 MG capsule Take 1 capsule (300 mg total) by mouth 2 (two) times daily.  [name] lamoTRIgine (LAMICTAL) 150 MG tablet TAKE ONE TABLET BY MOUTH AT BEDTIME.  [name] levothyroxine (SYNTHROID, LEVOTHROID) 100 MCG tablet Take 100 mcg by mouth daily before breakfast.   . metoprolol tartrate (LOPRESSOR) 25 MG tablet Take 0.5 tablets (12.5 mg total) by mouth 2 (two) times daily.  . potassium chloride SA (K-DUR,KLOR-CON) 20 MEQ tablet Take 1 tablet (20 mEq total) by mouth daily for 10 days.  [name] rOPINIRole (REQUIP) 2 MG tablet Take 2-4 mg by mouth See admin instructions. Takes 2 mg by mouth in  the morning and 4 mg by mouth in the evening  . traMADol (ULTRAM) 50 MG tablet Take 1-2 tablets (50-100 mg total) by mouth every 6 (six) hours as needed for up to 7 days for moderate pain.  . traZODone (DESYREL) 50 MG tablet Take 50 mg by mouth at bedtime.  [name] venlafaxine XR (EFFEXOR-XR) 150 MG 24 hr capsule TAKE TWO (2) CAPSULES BY MOUTH AT BEDTIME. (  Patient taking differently: TAKE 300 MG BY MOUTH AT BEDTIME)  . albuterol (PROVENTIL HFA;VENTOLIN HFA) 108 (90 Base) MCG/ACT inhaler Inhale 2 puffs into the lungs every 6 (six) hours as needed for wheezing or shortness of breath.  . diclofenac sodium (VOLTAREN) 1 % GEL Apply 1 application topically daily as needed (for pain).   [name] EPINEPHrine (EPIPEN 2-PAK) 0.3 mg/0.3 mL IJ SOAJ injection Inject 0.3 mg into the muscle once.  . Insulin Glargine (TOUJEO SOLOSTAR) 300 UNIT/ML SOPN Inject 40 Units into the skin at bedtime.  [name] ketoconazole (NIZORAL) 2 % cream Apply 1 application topically 2 (two) times daily as needed for irritation.   . meclizine (ANTIVERT) 25 MG tablet Take 1 tablet (25 mg total) by mouth 3 (three) times daily as needed for dizziness. (Patient taking differently: Take 25 mg by mouth daily as needed for dizziness. )  . mupirocin cream (BACTROBAN) 2 % Apply topically 2 (two) times daily.  [name] oxymetazoline (AFRIN) 0.05 % nasal spray Place 1 spray into both nostrils as needed for congestion.  . Polyvinyl Alcohol-Povidone (REFRESH OP) Place 2 drops into both eyes daily as needed (for dry eyes).  . [DISCONTINUED] Insulin Glargine (TOUJEO SOLOSTAR) 300 UNIT/ML SOPN Inject 30 Units into the skin at bedtime.   No facility-administered encounter medications on file as of 09/11/2017.    ALLERGIES: Allergies  Allergen Reactions  . Amoxicillin Anaphylaxis and Other (See Comments)    Has patient had a PCN reaction causing immediate rash, facial/tongue/throat swelling, SOB or lightheadedness with hypotension: Yes Has patient had a PCN reaction causing  severe rash involving mucus membranes or skin necrosis: Yes Has patient had a PCN reaction that required hospitalization: Yes Has patient had a PCN reaction occurring within the last 10 years: Yes If all of the above answers are "NO", then may proceed with Cephalosporin use.   [name] Hydrocodone Anaphylaxis  . Depacon [Valproic Acid] Other (See Comments)    Causes falls    VACCINATION STATUS:  There is no immunization history on file for this patient.  Diabetes  She presents for her follow-up diabetic visit. She has type 2 diabetes mellitus. Onset time: She was diagnosed at approximate age of 73 years. Her disease course has been worsening. There are no hypoglycemic associated symptoms. Pertinent negatives for hypoglycemia include no confusion, headaches, pallor or seizures. Associated symptoms include fatigue. Pertinent negatives for diabetes include no chest pain, no polydipsia, no polyphagia and no polyuria. There are no hypoglycemic complications. Symptoms are worsening. Diabetic complications include heart disease. (Since her last visit, she underwent coronary artery bypass graft for 3 coronary vessels.) Risk factors for coronary artery disease include diabetes mellitus, dyslipidemia, hypertension, obesity and sedentary lifestyle. Current diabetic treatment includes oral agent (dual therapy) and insulin injections. She is compliant with treatment most of the time. Her weight is stable. She is following a generally unhealthy diet. She has not had a previous visit with a dietitian. She never participates in exercise. Her breakfast blood glucose range is generally 140-180 mg/dl. Her overall blood glucose range is 140-180 mg/dl. An ACE inhibitor/angiotensin II receptor blocker is being taken. Eye exam is current.  Hypertension  This is a chronic problem. The current episode started more than 1 year ago. Pertinent negatives include no chest pain, headaches, palpitations or shortness of breath. Past  treatments include ACE inhibitors. Compliance problems include diet, exercise, medication cost, medication side effects and psychosocial issues.  Identifiable causes of hypertension include a thyroid problem.  Hyperlipidemia  This is  a chronic problem. The current episode started more than 1 year ago. Pertinent negatives include no chest pain, myalgias or shortness of breath. Current antihyperlipidemic treatment includes statins. Risk factors for coronary artery disease include a sedentary lifestyle, obesity, hypertension, diabetes mellitus and dyslipidemia.  Thyroid Problem  Presents for follow-up visit. Symptoms include fatigue. Patient reports no cold intolerance, diarrhea, heat intolerance or palpitations. The symptoms have been stable. Past treatments include levothyroxine. Her past medical history is significant for hyperlipidemia.     Review of Systems  Constitutional: Positive for fatigue. Negative for unexpected weight change.  HENT: Negative for trouble swallowing and voice change.   Eyes: Negative for visual disturbance.  Respiratory: Negative for cough, shortness of breath and wheezing.   Cardiovascular: Negative for chest pain, palpitations and leg swelling.  Gastrointestinal: Negative for diarrhea, nausea and vomiting.  Endocrine: Negative for cold intolerance, heat intolerance, polydipsia, polyphagia and polyuria.  Musculoskeletal: Negative for arthralgias and myalgias.  Skin: Negative for color change, pallor, rash and wound.  Neurological: Negative for seizures and headaches.  Psychiatric/Behavioral: Negative for confusion and suicidal ideas.    Objective:    BP (!) 103/56   Pulse 70   Ht 5\' 3"  (1.6 m)   Wt 260 lb (117.9 kg)   BMI 46.06 kg/m   Wt Readings from Last 3 Encounters:  09/11/17 260 lb (117.9 kg)  09/05/17 259 lb 7.7 oz (117.7 kg)  08/22/17 256 lb (116.1 kg)    Physical Exam  Constitutional: She is oriented to person, place, and time. She appears  well-developed.  HENT:  Head: Normocephalic and atraumatic.  Eyes: EOM are normal.  Neck: Normal range of motion. Neck supple. No tracheal deviation present. No thyromegaly present.  Cardiovascular: Normal rate and regular rhythm.  Pulmonary/Chest: Effort normal and breath sounds normal.  Abdominal: Soft. Bowel sounds are normal. There is no tenderness. There is no guarding.  Musculoskeletal: Normal range of motion. She exhibits no edema.  Neurological: She is alert and oriented to person, place, and time. She has normal reflexes. No cranial nerve deficit. Coordination normal.  Skin: Skin is warm and dry. No rash noted. No erythema. No pallor.  Psychiatric: She has a normal mood and affect. Judgment normal.    CMP Latest Ref Rng & Units 09/05/2017 09/04/2017 09/03/2017  Glucose 65 - 99 mg/dL 134(H) 129(H) 158(H)  BUN 6 - 20 mg/dL 11 14 16   Creatinine 0.44 - 1.00 mg/dL 0.87 0.87 0.89  Sodium 135 - 145 mmol/L 139 139 140  Potassium 3.5 - 5.1 mmol/L 4.9 4.2 4.3  Chloride 101 - 111 mmol/L 101 101 104  CO2 22 - 32 mmol/L 30 29 27   Calcium 8.9 - 10.3 mg/dL 8.9 8.9 8.5(L)  Total Protein 6.5 - 8.1 g/dL 6.1(L) 6.1(L) 5.6(L)  Total Bilirubin 0.3 - 1.2 mg/dL 0.6 0.5 0.5  Alkaline Phos 38 - 126 U/L 53 59 55  AST 15 - 41 U/L 12(L) 13(L) 12(L)  ALT 14 - 54 U/L 14 17 18    Lipid Panel     Component Value Date/Time   CHOL 161 07/10/2016 0934   TRIG 153 (H) 07/10/2016 0934   HDL 47 (L) 07/10/2016 0934   CHOLHDL 3.4 07/10/2016 0934   VLDL 31 (H) 07/10/2016 0934   LDLCALC 83 07/10/2016 0934     Diabetic Labs (most recent): Lab Results  Component Value Date   HGBA1C 7.2 (H) 08/28/2017   HGBA1C 7.5 (H) 07/11/2017   HGBA1C 6.7 (H) 03/14/2017    Assessment &  Plan:   1. Uncontrolled type 2 diabetes mellitus with coronary artery disease status post coronary artery bypass graft   Her diabetes is  complicated by coronary artery disease, neuropathy and patient remains at a high risk for more  acute and chronic complications of diabetes which include CAD, CVA, CKD, retinopathy, and neuropathy. These are all discussed in detail with the patient.  Patient came with higher fasting blood glucose profile, A1c of 7.2%, higher than her last visit A1c of 6.7%.    - I have re-counseled the patient on diet management and weight loss  by adopting a carbohydrate restricted / protein rich  Diet.  -  Suggestion is made for her to avoid simple carbohydrates  from her diet including Cakes, Sweet Desserts / Pastries, Ice Cream, Soda (diet and regular), Sweet Tea, Candies, Chips, Cookies, Store Bought Juices, Alcohol in Excess of  1-2 drinks a day, Artificial Sweeteners, and "Sugar-free" Products. This will help patient to have stable blood glucose profile and potentially avoid unintended weight gain.  - Patient is advised to stick to a routine mealtimes to eat 3 meals  a day and avoid unnecessary snacks ( to snack only to correct hypoglycemia).  - I have approached patient with the following individualized plan to manage diabetes and patient agrees.  -She is currently rehabilitating in nursing home after her coronary artery bypass graft surgery.  -  She will continue to  require at least basal insulin in order for her to maintain control of diabetes to target.  -I we will increase her Toujeo to 40 units nightly associated with monitoring of blood glucose twice a day-daily before breakfast and at bedtime.    - I will continue on metformin 1000 mg by mouth twice a day   with meals and discontinue  Invokana.  - Patient will be considered for incretin therapy as appropriate next visit. - Patient specific target  for A1c; LDL, HDL, Triglycerides, and  Waist Circumference were discussed in detail.  2) BP/HTN: Her blood pressure is controlled to target.   -She is advised to continue on her current medications including Lasix, metoprolol. 3) Lipids/HPL: Her recent lipid panel showed LDL uncontrolled at 83.   She is advised to continue Lipitor 10 mg p.o. daily.    4)  Weight/Diet: CDE consult in progress, exercise, and carbohydrates information provided.  5) Hypothyroidism  - Her thyroid function tests are consistent with appropriate replacement.  She is now on amiodarone 200 mg p.o. twice daily.  I advised her to continue levothyroxine 100 mcg p.o. every morning.     - We discussed about correct intake of levothyroxine, at fasting, with water, separated by at least 30 minutes from breakfast, and separated by more than 4 hours from calcium, iron, multivitamins, acid reflux medications (PPIs). -Patient is made aware of the fact that thyroid hormone replacement is needed for life, dose to be adjusted by periodic monitoring of thyroid function tests.  6) Chronic Care/Health Maintenance:  -Patient is on ACEI/ARB and Statin medications and encouraged to continue to follow up with Ophthalmology, Podiatrist at least yearly or according to recommendations, and advised to  stay away from smoking. I have recommended yearly flu vaccine and pneumonia vaccination at least every 5 years; moderate intensity exercise for up to 150 minutes weekly; and  sleep for at least 7 hours a day.  I advised patient to maintain close follow up with her PCP for primary care needs.  - Time spent with the patient: 25  min, of which >50% was spent in reviewing her blood glucose logs , discussing her hypo- and hyper-glycemic episodes, reviewing her current and  previous labs and insulin doses and developing a plan to avoid hypo- and hyper-glycemia. Please refer to Patient Instructions for Blood Glucose Monitoring and Insulin/Medications Dosing Guide"  in media tab for additional information. Norma [name] participated in the discussions, expressed understanding, and voiced agreement with the above plans.  All questions were answered to her satisfaction. she is encouraged to contact clinic should she have any questions or concerns  prior to her return visit.   Follow up plan: Return in about 3 months (around 12/12/2017) for meter, and logs, follow up with pre-visit labs, meter, and logs.  Glade Lloyd, MD Phone: [phone]  Fax: [phone]  This note was partially dictated with voice recognition software. Similar sounding words can be transcribed inadequately or may not  be corrected upon review.  09/11/2017, 2:45 PM

## 2017-09-16 NOTE — Progress Notes (Signed)
[facility]  PATIENT: Norma [name] DOB: [birth year]   REASON FOR VISIT: Follow-up for stroke  HISTORY FROM: Patient alone at visit  HISTORY OF PRESENT ILLNESS:2/1/19KWMs. Norma [name] is a 67 year old right-handed white female with a history of diabetes, hypertension, and dyslipidemia.  The patient also has a history of frequent headaches, bipolar disorder, essential tremor, and a memory disorder.  The patient underwent MRI evaluation of the brain on 06 May 2017 for evaluation of the memory.  She had no new symptoms of numbness or weakness or speech changes or balance problems prior to the MRI evaluation.  The study showed a small acute cortical left parietal infarct.  The patient comes back to this office for an evaluation.  The patient has just started low-dose aspirin following the MRI.  The patient indicates that she does not smoke cigarettes.  She has not noted any difficulty with swallowing, or any problems with double vision or loss of vision.  She again reports no new numbness or weakness, she does have some numbness in the hands and feet related to her diabetic neuropathy.  MRI of the brain showed a mild degree of small vessel disease that was chronic as well. UPDATE 6/5/2019[name], 67 year old female returns for follow-up with history of stroke seen on MRI in January 2019.  She was started on low-dose aspirin and is now on aspirin 325 daily.  She has not had further stroke or TIA symptoms.  Carotid Doppler  Unremarkable.  2D echo 40 to 45% EF.  She remains on Lipitor without myalgias most recent hemoglobin A1c 7.2 in May 2019.  She continued to have angina and was seen by cardiology.  She had three-vessel CABG on 08/27/2017.  She is currently in inpatient rehab receiving occupational and physical therapy.  She returns for reevaluation.  REVIEW OF SYSTEMS: Full 14 system review of systems performed and notable only for those listed, all others are neg:    Constitutional: neg  Cardiovascular: neg Ear/Nose/Throat: neg  Skin: neg Eyes: neg Respiratory: Shortness of breath Gastroitestinal: Constipation Hematology/Lymphatic: Anemia Endocrine: neg Musculoskeletal: Joint pain back pain walking difficulty Allergy/Immunology: neg Neurological: Memory loss dizziness numbness weakness Psychiatric: neg Sleep : Restless leg   ALLERGIES: Allergies  Allergen Reactions  . Amoxicillin Anaphylaxis and Other (See Comments)    Has patient had a PCN reaction causing immediate rash, facial/tongue/throat swelling, SOB or lightheadedness with hypotension: Yes Has patient had a PCN reaction causing severe rash involving mucus membranes or skin necrosis: Yes Has patient had a PCN reaction that required hospitalization: Yes Has patient had a PCN reaction occurring within the last 10 years: Yes If all of the above answers are "NO", then may proceed with Cephalosporin use.   [name] Hydrocodone Anaphylaxis  . Depacon [Valproic Acid] Other (See Comments)    Causes falls     HOME MEDICATIONS: Outpatient Medications Prior to Visit  Medication Sig Dispense Refill  . albuterol (PROVENTIL HFA;VENTOLIN HFA) 108 (90 Base) MCG/ACT inhaler Inhale 2 puffs into the lungs every 6 (six) hours as needed for wheezing or shortness of breath.    [name] amiodarone (PACERONE) 200 MG tablet Take 1 tablet (200 mg total) by mouth 2 (two) times daily.    [name] aspirin EC 325 MG EC tablet Take 1 tablet (325 mg total) by mouth daily. 30 tablet 0  . atorvastatin (LIPITOR) 10 MG tablet Take 1 tablet (10 mg total) by mouth daily at 6 PM.    . baclofen (LIORESAL) 10 MG  tablet TAKE ONE TABLET BY MOUTH AT BEDTIME. 30 tablet 1  . buPROPion (WELLBUTRIN XL) 300 MG 24 hr tablet TAKE ONE TABLET BY MOUTH EVERY MORNING. 30 tablet 2  . cetirizine (ZYRTEC) 10 MG tablet Take 10 mg by mouth daily.    . diclofenac sodium (VOLTAREN) 1 % GEL Apply 1 application topically daily as needed (for pain).     [name]  EPINEPHrine (EPIPEN 2-PAK) 0.3 mg/0.3 mL IJ SOAJ injection Inject 0.3 mg into the muscle once.    [name] esomeprazole (NEXIUM) 40 MG capsule Take 40 mg by mouth 2 (two) times daily as needed.   12  . gabapentin (NEURONTIN) 300 MG capsule Take 1 capsule (300 mg total) by mouth 2 (two) times daily.    . Insulin Glargine (TOUJEO SOLOSTAR) 300 UNIT/ML SOPN Inject 40 Units into the skin at bedtime. 6 pen 2  . ketoconazole (NIZORAL) 2 % cream Apply 1 application topically 2 (two) times daily as needed for irritation.   3  . lamoTRIgine (LAMICTAL) 150 MG tablet TAKE ONE TABLET BY MOUTH AT BEDTIME. 30 tablet 2  . levothyroxine (SYNTHROID, LEVOTHROID) 100 MCG tablet Take 100 mcg by mouth daily before breakfast.     . meclizine (ANTIVERT) 25 MG tablet Take 1 tablet (25 mg total) by mouth 3 (three) times daily as needed for dizziness. (Patient taking differently: Take 25 mg by mouth daily as needed for dizziness. ) 30 tablet 0  . metoprolol tartrate (LOPRESSOR) 25 MG tablet Take 0.5 tablets (12.5 mg total) by mouth 2 (two) times daily.    . mupirocin cream (BACTROBAN) 2 % Apply topically 2 (two) times daily. 15 g 0  . oxymetazoline (AFRIN) 0.05 % nasal spray Place 1 spray into both nostrils as needed for congestion.    . polyethylene glycol (MIRALAX / GLYCOLAX) packet Take 17 g by mouth daily as needed.    . Polyvinyl Alcohol-Povidone (REFRESH OP) Place 2 drops into both eyes daily as needed (for dry eyes).    [name] rOPINIRole (REQUIP) 2 MG tablet Take 2 mg by mouth 3 (three) times daily. Takes 2 mg by mouth in the morning and 4 mg by mouth in the evening    . traZODone (DESYREL) 50 MG tablet Take 50 mg by mouth at bedtime.  5  . venlafaxine XR (EFFEXOR-XR) 150 MG 24 hr capsule TAKE TWO (2) CAPSULES BY MOUTH AT BEDTIME. (Patient taking differently: TAKE 300 MG BY MOUTH AT BEDTIME) 60 capsule 2  . furosemide (LASIX) 40 MG tablet Take 1 tablet (40 mg total) by mouth daily for 10 days. (Patient not taking: Reported on  09/17/2017)    . potassium chloride SA (K-DUR,KLOR-CON) 20 MEQ tablet Take 1 tablet (20 mEq total) by mouth daily for 10 days. 10 tablet 0   No facility-administered medications prior to visit.     PAST MEDICAL HISTORY: Past Medical History:  Diagnosis Date  . Anemia   . Anxiety   . Bipolar disorder ([place])   . Bulging lumbar disc    L3-4  . Chronic daily headache   . Chronic low back pain 09/20/2014  . COPD (chronic obstructive pulmonary disease) ([place])   . Degenerative arthritis   . Depression   . Diabetes mellitus, type II ([place])   . DM type 2 with diabetic peripheral neuropathy ([place]) 09/20/2014  . Dyslipidemia   . Dyspnea   . Gastroparesis   . GERD (gastroesophageal reflux disease)   . Heart murmur   . History of hiatal hernia   .  Hypothyroidism   . IBS (irritable bowel syndrome)   . Memory difficulty 09/20/2014  . Morbid obesity ([place])   . Neuropathy   . Obstructive sleep apnea on CPAP   . Restless legs syndrome (RLS) 09/17/2012  . Stroke (cerebrum) ([place]) 05/16/2017   Left parietal    PAST SURGICAL HISTORY: Past Surgical History:  Procedure Laterality Date  . ABDOMINAL HYSTERECTOMY    . ARTHROSCOPY KNEE W/ DRILLING  06/2011   and Decemer of 2013.  [name] Carpel tunnel  1980's  . CATARACT EXTRACTION W/PHACO Right 03/21/2017   Procedure: CATARACT EXTRACTION PHACO AND INTRAOCULAR LENS PLACEMENT RIGHT EYE;  Surgeon: Baruch Goldmann, MD;  Location: [facility];  Service: Ophthalmology;  Laterality: Right;  CDE: 2.91   . CATARACT EXTRACTION W/PHACO Left 04/18/2017   Procedure: CATARACT EXTRACTION PHACO AND INTRAOCULAR LENS PLACEMENT LEFT EYE;  Surgeon: Baruch Goldmann, MD;  Location: [facility];  Service: Ophthalmology;  Laterality: Left;  left  . CHOLECYSTECTOMY    . COLONOSCOPY WITH PROPOFOL N/A 10/04/2016   Procedure: COLONOSCOPY WITH PROPOFOL;  Surgeon: Rogene Houston, MD;  [facility]: [facility];  Service: Endoscopy;  Laterality: N/A;  11:10  . CORONARY ARTERY BYPASS GRAFT N/A 08/29/2017    Procedure: CORONARY ARTERY BYPASS GRAFTING (CABG) x 3 WITH ENDOSCOPIC HARVESTING OF RIGHT SAPHENOUS VEIN;  Surgeon: Ivin Poot, MD;  Location: [facility];  Service: Open Heart Surgery;  Laterality: N/A;  . ESOPHAGOGASTRODUODENOSCOPY N/A 04/25/2016   Procedure: ESOPHAGOGASTRODUODENOSCOPY (EGD);  Surgeon: Rogene Houston, MD;  [facility];  Service: Endoscopy;  Laterality: N/A;  730  . LEFT HEART CATH AND CORONARY ANGIOGRAPHY N/A 08/27/2017   Procedure: LEFT HEART CATH AND CORONARY ANGIOGRAPHY;  Surgeon: Sherren Mocha, MD;  Location: [facility];  Service: Cardiovascular::  pLAD 95% - p-mLAD 50%, ostD1 90% -pD1 80%; ostOM1 90%; rPDA 80%. EF ~50-55% - HK of dital Anterolateral & Apical wall.  - Rec CVTS c/s  . RECTAL SURGERY     fissure  . SHOULDER SURGERY Left    arthroscopy in March of this year  . TEE WITHOUT CARDIOVERSION N/A 08/29/2017   Procedure: TRANSESOPHAGEAL ECHOCARDIOGRAM (TEE);  Surgeon: Prescott Gum, Collier Salina, MD;  Location: [facility];  Service: Open Heart Surgery;  Laterality: N/A;  . TRANSTHORACIC ECHOCARDIOGRAM  06/06/2017   Mild to moderate reduced EF 40 and 45%.  Anterior septal, inferoseptal and basal to mid inferior hypokinesis.  GR 1 DD.  No significant valvular lesion    FAMILY HISTORY: Family History  Problem Relation Age of Onset  . Hypertension Mother   . Lymphoma Mother   . Depression Mother   . Arthritis Father   . Alcohol abuse Father   . Hypertension Sister   . Cancer Brother        kidney and lung  . Alcohol abuse Brother   . Alcohol abuse Paternal Uncle   . Alcohol abuse Paternal Grandfather   . Alcohol abuse Paternal Grandmother     SOCIAL HISTORY: Social History   Socioeconomic History  . Marital status: Single    Spouse name: Not on file  . Number of children: 0  . Years of education: 59  . Highest education level: Not on file  Occupational History    Employer: [employer]  . Financial resource strain: Not on  file  . Food insecurity:    Worry: Not on file    Inability: Not on file  . Transportation needs:    Medical:  Not on file    Non-medical: Not on file  Tobacco Use  . Smoking status: Former Smoker    Types: Cigarettes    Last attempt to quit: 02/04/1971    Years since quitting: 46.6  . Smokeless tobacco: Never Used  . Tobacco comment: smoked 2 cigarettes a day  Substance and Sexual Activity  . Alcohol use: No    Alcohol/week: 0.0 oz  . Drug use: No  . Sexual activity: Never  Lifestyle  . Physical activity:    Days per week: Not on file    Minutes per session: Not on file  . Stress: Not on file  Relationships  . Social connections:    Talks on phone: Not on file    Gets together: Not on file    Attends religious service: Not on file    Active member of club or organization: Not on file    Attends meetings of clubs or organizations: Not on file    Relationship status: Not on file  . Intimate partner violence:    Fear of current or ex partner: Not on file    Emotionally abused: Not on file    Physically abused: Not on file    Forced sexual activity: Not on file  Other Topics Concern  . Not on file  Social History Narrative   Patient lives at home alone.    Patient has no children.    Patient has her masters in nursing.    Patient is single.    Patient drinks about 2 glasses of tea daily.   Patient is right handed.     PHYSICAL EXAM  Vitals:   09/17/17 1356  BP: 124/64  Pulse: 68  Weight: 261 lb 9.6 oz (118.7 kg)  Height: 5\' 3"  (1.6 m)   Body mass index is 46.34 kg/m.  Generalized: Well developed, morbidly obese female in no acute distress  Head: normocephalic and atraumatic,. Oropharynx benign  Neck: Supple, no carotid bruits  Cardiac: Regular rate rhythm, no murmur  Musculoskeletal: No deformity   Neurological examination   Mentation: Alert oriented to time, place, history taking. Attention span and concentration appropriate. Recent and remote memory  intact.  Follows all commands speech and language fluent. MMSE 28/30 missing items in calculation and 1 of 3 recall.  Cranial nerve II-XII: .Pupils were equal round reactive to light extraocular movements were full, visual field were full on confrontational test. Facial sensation and strength were normal. hearing was intact to finger rubbing bilaterally. Uvula tongue midline. head turning and shoulder shrug were normal and symmetric.Tongue protrusion into cheek strength was normal. Motor: normal bulk and tone, full strength in the BUE, BLE,  Sensory: Sensory testing is intact to pinprick, soft touch, vibration sensation, and position sense on all 4 extremities, with exception of a stocking pattern pinprick sensory deficit up to the knees bilaterally. No evidence of extinction is noted Coordination: finger-nose-finger, heel-to-shin bilaterally, no dysmetria, mild tremors seen with finger-to-nose bilaterally Reflexes: Symmetric and depressed plantar responses were flexor bilaterally. Gait and Station: In wheelchair not ambulated DIAGNOSTIC DATA (LABS, IMAGING, TESTING) - I reviewed patient records, labs, notes, testing and imaging myself where available.  Lab Results  Component Value Date   WBC 15.4 (H) 09/05/2017   HGB 8.8 (L) 09/05/2017   HCT 27.8 (L) 09/05/2017   MCV 90.3 09/05/2017   PLT 190 09/05/2017      Component Value Date/Time   NA 139 09/05/2017 0332   K 4.9 09/05/2017 0332  CL 101 09/05/2017 0332   CO2 30 09/05/2017 0332   GLUCOSE 134 (H) 09/05/2017 0332   BUN 11 09/05/2017 0332   CREATININE 0.87 09/05/2017 0332   CREATININE 0.87 07/11/2017 1214   CALCIUM 8.9 09/05/2017 0332   PROT 6.1 (L) 09/05/2017 0332   ALBUMIN 3.0 (L) 09/05/2017 0332   AST 12 (L) 09/05/2017 0332   ALT 14 09/05/2017 0332   ALKPHOS 53 09/05/2017 0332   BILITOT 0.6 09/05/2017 0332   GFRNONAA >60 09/05/2017 0332   GFRNONAA 70 07/11/2017 1214   GFRAA >60 09/05/2017 0332   GFRAA 82 07/11/2017 1214    Lab Results  Component Value Date   CHOL 161 07/10/2016   HDL 47 (L) 07/10/2016   LDLCALC 83 07/10/2016   TRIG 153 (H) 07/10/2016   CHOLHDL 3.4 07/10/2016   Lab Results  Component Value Date   HGBA1C 7.2 (H) 08/28/2017   Lab Results  Component Value Date   VITAMINB12 240 09/20/2014   Lab Results  Component Value Date   TSH 3.71 07/11/2017      ASSESSMENT AND PLAN  66 y.o. year old female  has a past medical history of left parietal stroke , diabetes, hypertension, dyslipidemia, essential tremor, mild cognitive impairment and restless leg syndrome.  Readmission to the hospital Aug 27, 2017 for three-vessel CABG.  She is now an inpatient rehab  Stressed the importance of management of risk factors to prevent further stroke Continue aspirin for secondary stroke prevention  Maintain strict control of hypertension with blood pressure goal below 130/90, today's reading  124/64 continue antihypertensive medications Control of diabetes with hemoglobin A1c below 6.5 followed by primary care most recent hemoglobin A1c 7.2 continue diabetic medications Cholesterol with LDL cholesterol less than 70, followed by primary care,  most recent 83 continue statin drugs Lipitor Remains in inpatient rehab. Then discharge home with [facility] eat healthy diet with whole grains,  fresh fruits and vegetables Follow-up with primary care for stroke risk factor modification, maintain blood pressure goal less than 124 systolic, diabetes with P8K below 7, lipids with LDL below 70 Follow up with vascular surgery as planned Follow up 6 months Dennie Bible, [facility], [facility], [facility] [address], [place], [place] [address] [phone]

## 2017-09-17 ENCOUNTER — Encounter

## 2017-09-17 ENCOUNTER — Ambulatory Visit (INDEPENDENT_AMBULATORY_CARE_PROVIDER_SITE_OTHER): Payer: PPO | Admitting: Nurse Practitioner

## 2017-09-17 ENCOUNTER — Encounter: Payer: Self-pay | Admitting: Nurse Practitioner

## 2017-09-17 VITALS — BP 124/64 | HR 68 | Ht 63.0 in | Wt 261.6 lb

## 2017-09-17 DIAGNOSIS — R413 Other amnesia: Secondary | ICD-10-CM

## 2017-09-17 DIAGNOSIS — E782 Mixed hyperlipidemia: Secondary | ICD-10-CM | POA: Diagnosis not present

## 2017-09-17 DIAGNOSIS — I63132 Cerebral infarction due to embolism of left carotid artery: Secondary | ICD-10-CM | POA: Diagnosis not present

## 2017-09-17 DIAGNOSIS — R251 Tremor, unspecified: Secondary | ICD-10-CM

## 2017-09-17 NOTE — Progress Notes (Signed)
I have read the note, and I agree with the clinical assessment and plan.  Kwamaine Cuppett K Lawanda Holzheimer   

## 2017-09-17 NOTE — Patient Instructions (Signed)
See skilled nursing sheet

## 2017-09-19 ENCOUNTER — Other Ambulatory Visit: Payer: Self-pay

## 2017-09-19 DIAGNOSIS — G8929 Other chronic pain: Secondary | ICD-10-CM | POA: Diagnosis not present

## 2017-09-19 DIAGNOSIS — K3184 Gastroparesis: Secondary | ICD-10-CM | POA: Diagnosis not present

## 2017-09-19 DIAGNOSIS — G2581 Restless legs syndrome: Secondary | ICD-10-CM | POA: Diagnosis not present

## 2017-09-19 DIAGNOSIS — Z791 Long term (current) use of non-steroidal anti-inflammatories (NSAID): Secondary | ICD-10-CM | POA: Diagnosis not present

## 2017-09-19 DIAGNOSIS — E1143 Type 2 diabetes mellitus with diabetic autonomic (poly)neuropathy: Secondary | ICD-10-CM | POA: Diagnosis not present

## 2017-09-19 DIAGNOSIS — Z951 Presence of aortocoronary bypass graft: Secondary | ICD-10-CM | POA: Diagnosis not present

## 2017-09-19 DIAGNOSIS — I10 Essential (primary) hypertension: Secondary | ICD-10-CM | POA: Diagnosis not present

## 2017-09-19 DIAGNOSIS — M549 Dorsalgia, unspecified: Secondary | ICD-10-CM | POA: Diagnosis not present

## 2017-09-19 DIAGNOSIS — I2510 Atherosclerotic heart disease of native coronary artery without angina pectoris: Secondary | ICD-10-CM | POA: Diagnosis not present

## 2017-09-19 DIAGNOSIS — Z48812 Encounter for surgical aftercare following surgery on the circulatory system: Secondary | ICD-10-CM | POA: Diagnosis not present

## 2017-09-19 DIAGNOSIS — Z7982 Long term (current) use of aspirin: Secondary | ICD-10-CM | POA: Diagnosis not present

## 2017-09-19 DIAGNOSIS — F319 Bipolar disorder, unspecified: Secondary | ICD-10-CM | POA: Diagnosis not present

## 2017-09-19 DIAGNOSIS — E039 Hypothyroidism, unspecified: Secondary | ICD-10-CM | POA: Diagnosis not present

## 2017-09-19 DIAGNOSIS — E785 Hyperlipidemia, unspecified: Secondary | ICD-10-CM | POA: Diagnosis not present

## 2017-09-19 NOTE — Patient Outreach (Signed)
[facility]) Care Management  09/19/2017  Norma [name] Apr 27, 1950 [id number]   Transition of care  Referral date:  Referral source:  Insurance:   Telephone call to patient regarding transition of care referral. Unable to reach patient. HIPAA compliant voice message left with call back phone number.   PLAN: RNCM will attempt 2nd telephone call to patient within 4 business days. RNCM will send outreach letter.   Quinn Plowman RN,BSN,CCM [facility] Telephonic  [phone]  PLAN:

## 2017-09-24 ENCOUNTER — Encounter: Payer: Self-pay | Admitting: Cardiology

## 2017-09-24 ENCOUNTER — Ambulatory Visit (INDEPENDENT_AMBULATORY_CARE_PROVIDER_SITE_OTHER): Payer: PPO | Admitting: Cardiology

## 2017-09-24 ENCOUNTER — Other Ambulatory Visit: Payer: Self-pay

## 2017-09-24 VITALS — BP 106/68 | HR 89 | Ht 63.0 in | Wt 257.8 lb

## 2017-09-24 DIAGNOSIS — I2510 Atherosclerotic heart disease of native coronary artery without angina pectoris: Secondary | ICD-10-CM | POA: Diagnosis not present

## 2017-09-24 DIAGNOSIS — I4891 Unspecified atrial fibrillation: Secondary | ICD-10-CM | POA: Diagnosis not present

## 2017-09-24 DIAGNOSIS — I9789 Other postprocedural complications and disorders of the circulatory system, not elsewhere classified: Secondary | ICD-10-CM

## 2017-09-24 MED ORDER — AMIODARONE HCL 200 MG PO TABS: 200.0000 mg | ORAL_TABLET | Freq: Every day | ORAL | 1 refills | Status: DC

## 2017-09-24 NOTE — Patient Outreach (Signed)
[facility]) Care Management  09/24/2017  MEGGIN OLA 01-24-1951 [id number]   Transition of care  Referral date: 09/19/17 Referral source: discharged from [facility] rehab and nursing center.  Insurance: health team advantage Attempt #2  Second telephone call to patient regarding transition of care referral. Unable to reach patient. HIPAA compliant voice message left with call back phone number.   PLAN: RNCM will attempt 3rd  telephone call to patient within 4 business days.  Quinn Plowman RN,BSN,CCM [facility] Telephonic  [phone]

## 2017-09-24 NOTE — Progress Notes (Signed)
Clinical Summary Norma [name] is a 67 y.o.female seen today for follow up of the following medical problems.   1. Chest pain - 05/2017 echo LVEF 40-45% - 08/2017 cath for unstable angina: LAD 95%, D1 90%, RPDA 80%, OM 90%.  - CT surgery was consulted. 08/30/17 CABG with sequential SVG to diag and LAD, SVG-OM.   - recent leg edema. Recent SOB. Home weights 254 lbs. Some MSK pain after surgery at surgical site.  - fatigue on metoprolol. Prior issues with in the past she reports as well  2. Chronic LBBB  3. Postop afib - started on amio after CABG - no recent palpitations.    Past Medical History:  Diagnosis Date  . Anemia   . Anxiety   . Bipolar disorder ([place])   . Bulging lumbar disc    L3-4  . Chronic daily headache   . Chronic low back pain 09/20/2014  . COPD (chronic obstructive pulmonary disease) ([place])   . Degenerative arthritis   . Depression   . Diabetes mellitus, type II ([place])   . DM type 2 with diabetic peripheral neuropathy ([place]) 09/20/2014  . Dyslipidemia   . Dyspnea   . Gastroparesis   . GERD (gastroesophageal reflux disease)   . Heart murmur   . History of hiatal hernia   . Hypothyroidism   . IBS (irritable bowel syndrome)   . Memory difficulty 09/20/2014  . Morbid obesity ([place])   . Neuropathy   . Obstructive sleep apnea on CPAP   . Restless legs syndrome (RLS) 09/17/2012  . Stroke (cerebrum) ([place]) 05/16/2017   Left parietal     Allergies  Allergen Reactions  . Amoxicillin Anaphylaxis and Other (See Comments)    Has patient had a PCN reaction causing immediate rash, facial/tongue/throat swelling, SOB or lightheadedness with hypotension: Yes Has patient had a PCN reaction causing severe rash involving mucus membranes or skin necrosis: Yes Has patient had a PCN reaction that required hospitalization: Yes Has patient had a PCN reaction occurring within the last 10 years: Yes If all of the above answers are "NO", then may proceed with Cephalosporin use.   [name]  Hydrocodone Anaphylaxis  . Depacon [Valproic Acid] Other (See Comments)    Causes falls      Current Outpatient Medications  Medication Sig Dispense Refill  . acetaminophen (TYLENOL) 650 MG CR tablet Take 650 mg by mouth every 6 (six) hours as needed for pain.    [name] albuterol (PROVENTIL HFA;VENTOLIN HFA) 108 (90 Base) MCG/ACT inhaler Inhale 2 puffs into the lungs every 6 (six) hours as needed for wheezing or shortness of breath.    [name] amiodarone (PACERONE) 200 MG tablet Take 1 tablet (200 mg total) by mouth 2 (two) times daily.    [name] aspirin EC 325 MG EC tablet Take 1 tablet (325 mg total) by mouth daily. 30 tablet 0  . atorvastatin (LIPITOR) 10 MG tablet Take 1 tablet (10 mg total) by mouth daily at 6 PM.    . baclofen (LIORESAL) 10 MG tablet TAKE ONE TABLET BY MOUTH AT BEDTIME. 30 tablet 1  . buPROPion (WELLBUTRIN XL) 300 MG 24 hr tablet TAKE ONE TABLET BY MOUTH EVERY MORNING. 30 tablet 2  . calcium carbonate (TUMS - DOSED IN MG ELEMENTAL CALCIUM) 500 MG chewable tablet Chew 1 tablet by mouth as needed for indigestion or heartburn.    . cetirizine (ZYRTEC) 10 MG tablet Take 10 mg by mouth daily.    [name] docusate sodium (COLACE)  100 MG capsule Take 100 mg by mouth 2 (two) times daily.    [name] EPINEPHrine (EPIPEN 2-PAK) 0.3 mg/0.3 mL IJ SOAJ injection Inject 0.3 mg into the muscle once.    [name] esomeprazole (NEXIUM) 40 MG capsule Take 40 mg by mouth 2 (two) times daily as needed.   12  . furosemide (LASIX) 40 MG tablet Take 1 tablet (40 mg total) by mouth daily for 10 days. (Patient not taking: Reported on 09/17/2017)    . gabapentin (NEURONTIN) 300 MG capsule Take 1 capsule (300 mg total) by mouth 2 (two) times daily.    . Insulin Glargine (TOUJEO SOLOSTAR) 300 UNIT/ML SOPN Inject 40 Units into the skin at bedtime. 6 pen 2  . lamoTRIgine (LAMICTAL) 150 MG tablet TAKE ONE TABLET BY MOUTH AT BEDTIME. 30 tablet 2  . levothyroxine (SYNTHROID, LEVOTHROID) 100 MCG tablet Take 100 mcg by mouth daily before  breakfast.     . meclizine (ANTIVERT) 25 MG tablet Take 1 tablet (25 mg total) by mouth 3 (three) times daily as needed for dizziness. (Patient taking differently: Take 25 mg by mouth daily as needed for dizziness. ) 30 tablet 0  . metFORMIN (GLUCOPHAGE) 1000 MG tablet Take 1,000 mg by mouth 2 (two) times daily with a meal.    . metoprolol tartrate (LOPRESSOR) 25 MG tablet Take 0.5 tablets (12.5 mg total) by mouth 2 (two) times daily.    . polyethylene glycol (MIRALAX / GLYCOLAX) packet Take 17 g by mouth daily as needed.    . Polyvinyl Alcohol-Povidone (REFRESH OP) Place 2 drops into both eyes daily as needed (for dry eyes).    . potassium chloride SA (K-DUR,KLOR-CON) 20 MEQ tablet Take 1 tablet (20 mEq total) by mouth daily for 10 days. 10 tablet 0  . rOPINIRole (REQUIP) 2 MG tablet Take by mouth 3 (three) times daily. Takes 2 mg in am and PM and 4mg  at bedtime.    . traMADol (ULTRAM) 50 MG tablet Take by mouth every 8 (eight) hours as needed.    . traZODone (DESYREL) 50 MG tablet Take 50 mg by mouth at bedtime.  5  . venlafaxine XR (EFFEXOR-XR) 150 MG 24 hr capsule TAKE TWO (2) CAPSULES BY MOUTH AT BEDTIME. (Patient taking differently: TAKE 300 MG BY MOUTH AT BEDTIME) 60 capsule 2   No current facility-administered medications for this visit.      Past Surgical History:  Procedure Laterality Date  . ABDOMINAL HYSTERECTOMY    . ARTHROSCOPY KNEE W/ DRILLING  06/2011   and Decemer of 2013.  [name] Carpel tunnel  1980's  . CATARACT EXTRACTION W/PHACO Right 03/21/2017   Procedure: CATARACT EXTRACTION PHACO AND INTRAOCULAR LENS PLACEMENT RIGHT EYE;  Surgeon: Baruch Goldmann, MD;  [facility];  Service: Ophthalmology;  Laterality: Right;  CDE: 2.91   . CATARACT EXTRACTION W/PHACO Left 04/18/2017   Procedure: CATARACT EXTRACTION PHACO AND INTRAOCULAR LENS PLACEMENT LEFT EYE;  Surgeon: Baruch Goldmann, MD;  [facility]: [facility];  Service: Ophthalmology;  Laterality: Left;  left  . CHOLECYSTECTOMY    .  COLONOSCOPY WITH PROPOFOL N/A 10/04/2016   Procedure: COLONOSCOPY WITH PROPOFOL;  Surgeon: Rogene Houston, MD;  [facility];  Service: Endoscopy;  Laterality: N/A;  11:10  . CORONARY ARTERY BYPASS GRAFT N/A 08/29/2017   Procedure: CORONARY ARTERY BYPASS GRAFTING (CABG) x 3 WITH ENDOSCOPIC HARVESTING OF RIGHT SAPHENOUS VEIN;  Surgeon: Ivin Poot, MD;  Location: [place];  Service: Open Heart Surgery;  Laterality: N/A;  .  ESOPHAGOGASTRODUODENOSCOPY N/A 04/25/2016   Procedure: ESOPHAGOGASTRODUODENOSCOPY (EGD);  Surgeon: Rogene Houston, MD;  [facility];  Service: Endoscopy;  Laterality: N/A;  730  . LEFT HEART CATH AND CORONARY ANGIOGRAPHY N/A 08/27/2017   Procedure: LEFT HEART CATH AND CORONARY ANGIOGRAPHY;  Surgeon: Sherren Mocha, MD;  Location: [place] [facility];  Service: Cardiovascular::  pLAD 95% - p-mLAD 50%, ostD1 90% -pD1 80%; ostOM1 90%; rPDA 80%. EF ~50-55% - HK of dital Anterolateral & Apical wall.  - Rec CVTS c/s  . RECTAL SURGERY     fissure  . SHOULDER SURGERY Left    arthroscopy in March of this year  . TEE WITHOUT CARDIOVERSION N/A 08/29/2017   Procedure: TRANSESOPHAGEAL ECHOCARDIOGRAM (TEE);  Surgeon: Prescott Gum, Collier Salina, MD;  Location: [place];  Service: Open Heart Surgery;  Laterality: N/A;  . TRANSTHORACIC ECHOCARDIOGRAM  06/06/2017   Mild to moderate reduced EF 40 and 45%.  Anterior septal, inferoseptal and basal to mid inferior hypokinesis.  GR 1 DD.  No significant valvular lesion     Allergies  Allergen Reactions  . Amoxicillin Anaphylaxis and Other (See Comments)    Has patient had a PCN reaction causing immediate rash, facial/tongue/throat swelling, SOB or lightheadedness with hypotension: Yes Has patient had a PCN reaction causing severe rash involving mucus membranes or skin necrosis: Yes Has patient had a PCN reaction that required hospitalization: Yes Has patient had a PCN reaction occurring within the last 10 years: Yes If all of the above  answers are "NO", then may proceed with Cephalosporin use.   [name] Hydrocodone Anaphylaxis  . Depacon [Valproic Acid] Other (See Comments)    Causes falls       Family History  Problem Relation Age of Onset  . Hypertension Mother   . Lymphoma Mother   . Depression Mother   . Arthritis Father   . Alcohol abuse Father   . Hypertension Sister   . Cancer Brother        kidney and lung  . Alcohol abuse Brother   . Alcohol abuse Paternal Uncle   . Alcohol abuse Paternal Grandfather   . Alcohol abuse Paternal Grandmother      Social History Ms. Pollino reports that she quit smoking about 46 years ago. Her smoking use included cigarettes. She has never used smokeless tobacco. Ms. Verne reports that she does not drink alcohol.   Review of Systems CONSTITUTIONAL: No weight loss, fever, chills, weakness or fatigue.  HEENT: Eyes: No visual loss, blurred vision, double vision or yellow sclerae.No hearing loss, sneezing, congestion, runny nose or sore throat.  SKIN: No rash or itching.  CARDIOVASCULAR: per hpi RESPIRATORY: No shortness of breath, cough or sputum.  GASTROINTESTINAL: No anorexia, nausea, vomiting or diarrhea. No abdominal pain or blood.  GENITOURINARY: No burning on urination, no polyuria NEUROLOGICAL: No headache, dizziness, syncope, paralysis, ataxia, numbness or tingling in the extremities. No change in bowel or bladder control.  MUSCULOSKELETAL: No muscle, back pain, joint pain or stiffness.  LYMPHATICS: No enlarged nodes. No history of splenectomy.  PSYCHIATRIC: No history of depression or anxiety.  ENDOCRINOLOGIC: No reports of sweating, cold or heat intolerance. No polyuria or polydipsia.  [name]   Physical Examination Vitals:   09/24/17 1439  BP: 106/68  Pulse: 89  SpO2: 96%   Vitals:   09/24/17 1439  Weight: 257 lb 12.8 oz (116.9 kg)  Height: 5\' 3"  (1.6 m)    Gen: resting comfortably, no acute distress HEENT: no scleral icterus, pupils  equal round and  reactive, no palptable cervical adenopathy,  CV: RRR, no m/r/g, no jvd Resp: Clear to auscultation bilaterally GI: abdomen is soft, non-tender, non-distended, normal bowel sounds, no hepatosplenomegaly MSK: extremities are warm, no edema.  Skin: warm, no rash Neuro:  no focal deficits Psych: appropriate affect   Diagnostic Studies  03/2016 Lexiscan  Probable normal perfusion and mild soft tissue attenuation (breast) No significant ischemia or evidence for scar  The left ventricular ejection fraction is normal (55-65%).  Low risk scan.  08/2017 cath  RPDA lesion is 80% stenosed.  Ost 1st Mrg to 1st Mrg lesion is 90% stenosed.  Prox LAD lesion is 95% stenosed.  Prox LAD to Mid LAD lesion is 50% stenosed.  Ost 1st Diag lesion is 90% stenosed.  1st Diag lesion is 80% stenosed.  The left ventricular ejection fraction is 50-55% by visual estimate.  There is mild left ventricular systolic dysfunction.   1.  Severe multivessel coronary artery disease with severe stenosis of the proximal LAD/first diagonal bifurcation, first obtuse marginal branch of the circumflex, and PDA branch of the RCA. 2.  Mild segmental LV systolic dysfunction with hypokinesis of the distal anterolateral and apical walls, LVEF estimated at 50 to 55%.  08/2017 TEE  Aortic valve: The valve is trileaflet. No stenosis. No regurgitation.  Mitral valve: Mild regurgitation. Central jet.  Right ventricle: Normal cavity size, wall thickness and ejection fraction.  Pericardium: Trivial pericardial effusion.  Tricuspid valve: No regurgitation  Pulmonic valve: No regurgitation by color doppler.  Left ventricle: Regional wall motion abnormalities present, dyskinetic basal and apical septal wall, hypokinesis of lateral wall and inferoseptal wall. Increased wall thickness. LVEF 35-40%.  No ASD/PFO present  Unable to rule out thrombus in LAA but velocities make thrombus less likely.  05/2017 echo Study  Conclusions  - Left ventricle: The cavity size was normal. There was mild   concentric hypertrophy. Systolic function was mildly to   moderately reduced. The estimated ejection fraction was in the   range of 40% to 45%. Hypokinesis of the anteroseptal,   inferoseptal and basal to mid inferior myocardium. Doppler   parameters are consistent with abnormal left ventricular   relaxation (grade 1 diastolic dysfunction). Doppler parameters   are consistent with indeterminate ventricular filling pressure. - Aortic valve: Transvalvular velocity was within the normal range.   There was no stenosis. There was no regurgitation. - Mitral valve: Transvalvular velocity was within the normal range.   There was no evidence for stenosis. There was no regurgitation. - Right ventricle: The cavity size was normal. Wall thickness was   normal. Systolic function was normal. - Tricuspid valve: There was trivial regurgitation. - Global longitudinal strain -11.9%.   Assessment and Plan  1. CAD - recent CABG, recocoverning well - significant fatigue likely due to beta blocker, she has had similar issues in the past on beta blocker - stop lopressor.    2. Postoperative afib - change to amio 200mg  daily - likely d/c amio at next f/u, no prior history of afib before surgery - has not been committed to anticoag at this time.   F/u 6 weeks     Arnoldo Lenis, M.D

## 2017-09-24 NOTE — Patient Instructions (Signed)
Medication Instructions:  Your physician has recommended you make the following change in your medication:  STOP Lopressor  Change Amiodarone to 200 mg once daily  Please continue all other medications as prescribed   Labwork: NONE  Testing/Procedures: NONE  Follow-Up: Your physician recommends that you schedule a follow-up appointment in: [place] DR. BRANCH   Any Other Special Instructions Will Be Listed Below (If Applicable).  If you need a refill on your cardiac medications before your next appointment, please call your pharmacy.

## 2017-09-29 ENCOUNTER — Other Ambulatory Visit: Payer: Self-pay

## 2017-09-29 NOTE — Patient Outreach (Signed)
[facility]) Care Management  09/29/2017  CAMILLIA MARCY Sep 10, 1950 [id number]  Transition of care  Referral date: 09/19/17 Referral source: discharged from [facility] rehab and nursing center and referral received from Utilization management Insurance:health team advantage Attempt #3  Telephone call to patient regarding transition of care referral. HIPAA verified with patient. Explained reason for call. Patient states she is going out with her sister to run errands. Request call back at another time.   PLAN; RNCM will send [facility] care management letter to patient.  Will attempt call to patient within 4 business days.   Quinn Plowman RN,BSN,CCM [facility] Telephonic  [phone]

## 2017-09-30 ENCOUNTER — Other Ambulatory Visit: Payer: Self-pay | Admitting: Cardiothoracic Surgery

## 2017-09-30 ENCOUNTER — Ambulatory Visit: Payer: Self-pay

## 2017-09-30 DIAGNOSIS — Z951 Presence of aortocoronary bypass graft: Secondary | ICD-10-CM

## 2017-10-01 ENCOUNTER — Ambulatory Visit
Admission: RE | Admit: 2017-10-01 | Discharge: 2017-10-01 | Disposition: A | Discharge status: Home / Self Care | Payer: PPO | Source: Ambulatory Visit | Attending: Cardiothoracic Surgery | Admitting: Cardiothoracic Surgery

## 2017-10-01 ENCOUNTER — Other Ambulatory Visit: Payer: Self-pay

## 2017-10-01 ENCOUNTER — Encounter: Payer: Self-pay | Admitting: Cardiothoracic Surgery

## 2017-10-01 ENCOUNTER — Other Ambulatory Visit: Payer: Self-pay | Admitting: Cardiology

## 2017-10-01 ENCOUNTER — Encounter: Payer: Self-pay | Admitting: Cardiology

## 2017-10-01 ENCOUNTER — Ambulatory Visit (INDEPENDENT_AMBULATORY_CARE_PROVIDER_SITE_OTHER): Payer: Self-pay | Admitting: Cardiothoracic Surgery

## 2017-10-01 VITALS — BP 136/72 | HR 88 | Resp 18 | Ht 63.0 in | Wt 252.0 lb

## 2017-10-01 DIAGNOSIS — Z951 Presence of aortocoronary bypass graft: Secondary | ICD-10-CM

## 2017-10-01 DIAGNOSIS — R0602 Shortness of breath: Secondary | ICD-10-CM | POA: Diagnosis not present

## 2017-10-01 DIAGNOSIS — L089 Local infection of the skin and subcutaneous tissue, unspecified: Secondary | ICD-10-CM

## 2017-10-01 DIAGNOSIS — L299 Pruritus, unspecified: Secondary | ICD-10-CM

## 2017-10-01 DIAGNOSIS — I2510 Atherosclerotic heart disease of native coronary artery without angina pectoris: Secondary | ICD-10-CM

## 2017-10-01 MED ORDER — HYDROXYZINE HCL 10 MG PO TABS: 10.0000 mg | ORAL_TABLET | Freq: Three times a day (TID) | ORAL | 0 refills | Status: DC | PRN

## 2017-10-01 MED ORDER — CEPHALEXIN 500 MG PO CAPS: 500.0000 mg | ORAL_CAPSULE | Freq: Three times a day (TID) | ORAL | 0 refills | Status: DC

## 2017-10-01 NOTE — Progress Notes (Signed)
ta

## 2017-10-01 NOTE — Progress Notes (Signed)
PCP is Sinda Du, MD Referring Provider is Arnoldo Lenis, MD  Chief Complaint  Patient presents with  . Routine Post Op    s/p CABG X 3.Marland KitchenMarland Kitchen5/17/19,,,with a CXR    HPI: Patient returns with chest x-ray 1 month after CABG x3 for non-STEMI.  She is morbidly obese with history of COPD.  Her chest x-ray today is clear sternal wires intact.  She denies chest pain but has had some shortness of breath with exertion probably related to deconditioning.  No history of edema.  The right leg incision at the saphenous vein site his nonhealing with separation of the skin.  She is treating it with wet-to-dry saline dressings.  She is a Marine scientist.  The sternal incision is healing well except for a small skin pimple with some intermittent drainage probably at the site of a skin suture knot.  The leg incision has surrounding erythema-cellulitis and will treated with Keflex. We talked about increasing her activity levels to include driving in light lifting.  Past Medical History:  Diagnosis Date  . Anemia   . Anxiety   . Bipolar disorder ([place])   . Bulging lumbar disc    L3-4  . Chronic daily headache   . Chronic low back pain 09/20/2014  . COPD (chronic obstructive pulmonary disease) ([place])   . Degenerative arthritis   . Depression   . Diabetes mellitus, type II ([place])   . DM type 2 with diabetic peripheral neuropathy ([place]) 09/20/2014  . Dyslipidemia   . Dyspnea   . Gastroparesis   . GERD (gastroesophageal reflux disease)   . Heart murmur   . History of hiatal hernia   . Hypothyroidism   . IBS (irritable bowel syndrome)   . Memory difficulty 09/20/2014  . Morbid obesity ([place])   . Neuropathy   . Obstructive sleep apnea on CPAP   . Restless legs syndrome (RLS) 09/17/2012  . Stroke (cerebrum) ([place]) 05/16/2017   Left parietal    Past Surgical History:  Procedure Laterality Date  . ABDOMINAL HYSTERECTOMY    . ARTHROSCOPY KNEE W/ DRILLING  06/2011   and Decemer of 2013.  [name] Carpel tunnel  1980's  .  CATARACT EXTRACTION W/PHACO Right 03/21/2017   Procedure: CATARACT EXTRACTION PHACO AND INTRAOCULAR LENS PLACEMENT RIGHT EYE;  Surgeon: Baruch Goldmann, MD;  [facility];  Service: Ophthalmology;  Laterality: Right;  CDE: 2.91   . CATARACT EXTRACTION W/PHACO Left 04/18/2017   Procedure: CATARACT EXTRACTION PHACO AND INTRAOCULAR LENS PLACEMENT LEFT EYE;  Surgeon: Baruch Goldmann, MD;  [facility];  Service: Ophthalmology;  Laterality: Left;  left  . CHOLECYSTECTOMY    . COLONOSCOPY WITH PROPOFOL N/A 10/04/2016   Procedure: COLONOSCOPY WITH PROPOFOL;  Surgeon: Rogene Houston, MD;  [facility];  Service: Endoscopy;  Laterality: N/A;  11:10  . CORONARY ARTERY BYPASS GRAFT N/A 08/29/2017   Procedure: CORONARY ARTERY BYPASS GRAFTING (CABG) x 3 WITH ENDOSCOPIC HARVESTING OF RIGHT SAPHENOUS VEIN;  Surgeon: Ivin Poot, MD;  Location: [place];  Service: Open Heart Surgery;  Laterality: N/A;  . ESOPHAGOGASTRODUODENOSCOPY N/A 04/25/2016   Procedure: ESOPHAGOGASTRODUODENOSCOPY (EGD);  Surgeon: Rogene Houston, MD;  [facility];  Service: Endoscopy;  Laterality: N/A;  730  . LEFT HEART CATH AND CORONARY ANGIOGRAPHY N/A 08/27/2017   Procedure: LEFT HEART CATH AND CORONARY ANGIOGRAPHY;  Surgeon: Sherren Mocha, MD;  Location: [place] [facility];  Service: Cardiovascular::  pLAD 95% - p-mLAD 50%, ostD1 90% -pD1 80%; ostOM1 90%;  rPDA 80%. EF ~50-55% - HK of dital Anterolateral & Apical wall.  - Rec CVTS c/s  . RECTAL SURGERY     fissure  . SHOULDER SURGERY Left    arthroscopy in March of this year  . TEE WITHOUT CARDIOVERSION N/A 08/29/2017   Procedure: TRANSESOPHAGEAL ECHOCARDIOGRAM (TEE);  Surgeon: Prescott Gum, Collier Salina, MD;  Location: [place];  Service: Open Heart Surgery;  Laterality: N/A;  . TRANSTHORACIC ECHOCARDIOGRAM  06/06/2017   Mild to moderate reduced EF 40 and 45%.  Anterior septal, inferoseptal and basal to mid inferior hypokinesis.  GR 1 DD.  No significant valvular lesion     Family History  Problem Relation Age of Onset  . Hypertension Mother   . Lymphoma Mother   . Depression Mother   . Arthritis Father   . Alcohol abuse Father   . Hypertension Sister   . Cancer Brother        kidney and lung  . Alcohol abuse Brother   . Alcohol abuse Paternal Uncle   . Alcohol abuse Paternal Grandfather   . Alcohol abuse Paternal Grandmother     Social History Social History   Tobacco Use  . Smoking status: Former Smoker    Types: Cigarettes    Last attempt to quit: 02/04/1971    Years since quitting: 46.6  . Smokeless tobacco: Never Used  . Tobacco comment: smoked 2 cigarettes a day  Substance Use Topics  . Alcohol use: No    Alcohol/week: 0.0 oz  . Drug use: No    Current Outpatient Medications  Medication Sig Dispense Refill  . acetaminophen (TYLENOL) 650 MG CR tablet Take 650 mg by mouth every 6 (six) hours as needed for pain.    [name] albuterol (PROVENTIL HFA;VENTOLIN HFA) 108 (90 Base) MCG/ACT inhaler Inhale 2 puffs into the lungs every 6 (six) hours as needed for wheezing or shortness of breath.    [name] amiodarone (PACERONE) 200 MG tablet Take 1 tablet (200 mg total) by mouth daily. 30 tablet 1  . aspirin EC 325 MG EC tablet Take 1 tablet (325 mg total) by mouth daily. 30 tablet 0  . atorvastatin (LIPITOR) 10 MG tablet Take 1 tablet (10 mg total) by mouth daily at 6 PM.    . baclofen (LIORESAL) 10 MG tablet TAKE ONE TABLET BY MOUTH AT BEDTIME. 30 tablet 1  . buPROPion (WELLBUTRIN XL) 300 MG 24 hr tablet TAKE ONE TABLET BY MOUTH EVERY MORNING. 30 tablet 2  . calcium carbonate (TUMS - DOSED IN MG ELEMENTAL CALCIUM) 500 MG chewable tablet Chew 1 tablet by mouth as needed for indigestion or heartburn.    [name] EPINEPHrine (EPIPEN 2-PAK) 0.3 mg/0.3 mL IJ SOAJ injection Inject 0.3 mg into the muscle once.    [name] esomeprazole (NEXIUM) 40 MG capsule Take 40 mg by mouth 2 (two) times daily as needed.   12  . furosemide (LASIX) 40 MG tablet Take 40 mg by mouth as  needed for fluid or edema.    . gabapentin (NEURONTIN) 300 MG capsule Take 1 capsule (300 mg total) by mouth 2 (two) times daily.    . Insulin Glargine (TOUJEO SOLOSTAR) 300 UNIT/ML SOPN Inject 40 Units into the skin at bedtime. 6 pen 2  . lamoTRIgine (LAMICTAL) 150 MG tablet TAKE ONE TABLET BY MOUTH AT BEDTIME. 30 tablet 2  . levothyroxine (SYNTHROID, LEVOTHROID) 100 MCG tablet Take 100 mcg by mouth daily before breakfast.     . meclizine (ANTIVERT) 25 MG tablet Take 1  tablet (25 mg total) by mouth 3 (three) times daily as needed for dizziness. (Patient taking differently: Take 25 mg by mouth daily as needed for dizziness. ) 30 tablet 0  . metFORMIN (GLUCOPHAGE) 1000 MG tablet Take 1,000 mg by mouth 2 (two) times daily with a meal.    . Polyvinyl Alcohol-Povidone (REFRESH OP) Place 2 drops into both eyes daily as needed (for dry eyes).    [name] POTASSIUM CHLORIDE PO Take 20 mEq by mouth daily.    [name] rOPINIRole (REQUIP) 2 MG tablet Take 2 mg by mouth 3 (three) times daily. .    . traMADol (ULTRAM) 50 MG tablet Take 100 mg by mouth 4 (four) times daily as needed.     . traZODone (DESYREL) 50 MG tablet Take 50 mg by mouth at bedtime.  5  . venlafaxine XR (EFFEXOR-XR) 150 MG 24 hr capsule TAKE TWO (2) CAPSULES BY MOUTH AT BEDTIME. (Patient taking differently: TAKE 300 MG BY MOUTH AT BEDTIME) 60 capsule 2   No current facility-administered medications for this visit.     Allergies  Allergen Reactions  . Amoxicillin Anaphylaxis and Other (See Comments)    Has patient had a PCN reaction causing immediate rash, facial/tongue/throat swelling, SOB or lightheadedness with hypotension: Yes Has patient had a PCN reaction causing severe rash involving mucus membranes or skin necrosis: Yes Has patient had a PCN reaction that required hospitalization: Yes Has patient had a PCN reaction occurring within the last 10 years: Yes If all of the above answers are "NO", then may proceed with Cephalosporin use.   [name]  Hydrocodone Anaphylaxis  . Depacon [Valproic Acid] Other (See Comments)    Causes falls     Review of Systems  Improved appetite and strength No fever No angina No requirement for narcotics for musculoskeletal pain  BP 136/72 (BP Location: Right Arm, Patient Position: Sitting, Cuff Size: Large)   Pulse 88   Resp 18   Ht 5\' 3"  (1.6 m)   Wt 252 lb (114.3 kg)   SpO2 98% Comment: ION RA  BMI 44.64 kg/m  Physical Exam      Exam    General- alert and comfortable    Neck- no JVD, no cervical adenopathy palpable, no carotid bruit   Lungs- clear without rales, wheezes   Cor- regular rate and rhythm, no murmur , gallop   Abdomen- soft, non-tender   Extremities - warm, non-tender, minimal edema, right leg incision for vein harvest has split open with exposed subcutaneous tissue and surrounding erythema but without purulence   Neuro- oriented, appropriate, no focal weakness   Diagnostic Tests: Chest x-ray clear  Impression: Slow but steady progress after multivessel CABG in this short morbidly obese woman.  She has cellulitis and superficial infection of her leg incision and will be started on Keflex.  She will continue wet-to-dry daily dressing changes of the leg. She will increase her activity levels as we discussed in her visit. Plan: Return in 1 week for wound check.  Prescription for Keflex and for Atarax sent to her pharmacy, [facility].  Len Childs, MD [facility] [phone]

## 2017-10-03 ENCOUNTER — Other Ambulatory Visit: Payer: Self-pay

## 2017-10-03 NOTE — Patient Outreach (Signed)
[facility]) Care Management  10/03/2017  [name] 10-28-1950 [id number]   .No response from patient after 3 telephone calls and outreach letter attempt.  PLAN;  RNCM will close patient due to being unable to reach.  RNCM will send closure notification to patient's primary care provider  Quinn Plowman RN,BSN,CCM [facility] Telephonic  [phone]

## 2017-10-06 ENCOUNTER — Other Ambulatory Visit: Payer: Self-pay | Admitting: *Deleted

## 2017-10-06 ENCOUNTER — Ambulatory Visit (INDEPENDENT_AMBULATORY_CARE_PROVIDER_SITE_OTHER): Payer: Self-pay | Admitting: *Deleted

## 2017-10-06 DIAGNOSIS — S21109A Unspecified open wound of unspecified front wall of thorax without penetration into thoracic cavity, initial encounter: Secondary | ICD-10-CM

## 2017-10-06 DIAGNOSIS — I2510 Atherosclerotic heart disease of native coronary artery without angina pectoris: Secondary | ICD-10-CM

## 2017-10-06 DIAGNOSIS — Z951 Presence of aortocoronary bypass graft: Secondary | ICD-10-CM

## 2017-10-06 DIAGNOSIS — Z4889 Encounter for other specified surgical aftercare: Secondary | ICD-10-CM

## 2017-10-06 NOTE — Progress Notes (Signed)
Ms. Donaghy called the answering service over the weekend regarding an open area on her sternal incision. Dr. Roxy Manns asked her to call the office and see [place] today. On exam there is a slightly draining small pea sized hole between her breasts. This was noticed at her last visit but was only a pimple and thought to be the result of a stitch knot. There is drainage but I do not think it is frank pus. I obtained a specimen for culture noting that she is still on Keflex for her leg cellulitis. I cleansed the area with peroxide, then saline. The area was then trimmed of eschar and loose suture. I packed it with 1/4" plain nu gauze dampened with n/s  to be done daily. The area tunnels 3 cm straight into sternum. She is presently packing her lower leg wound and will start the sternal incision packing tomorrow. I notified [facility] of this new wound care. She will return on Wednesday as scheduled.

## 2017-10-07 ENCOUNTER — Ambulatory Visit: Payer: Self-pay | Admitting: Student

## 2017-10-07 ENCOUNTER — Other Ambulatory Visit: Payer: Self-pay | Admitting: *Deleted

## 2017-10-08 ENCOUNTER — Other Ambulatory Visit: Payer: Self-pay

## 2017-10-08 ENCOUNTER — Encounter: Payer: Self-pay | Admitting: Cardiothoracic Surgery

## 2017-10-08 ENCOUNTER — Ambulatory Visit (INDEPENDENT_AMBULATORY_CARE_PROVIDER_SITE_OTHER): Payer: Self-pay | Admitting: Cardiothoracic Surgery

## 2017-10-08 VITALS — BP 150/72 | HR 94 | Resp 18 | Ht 63.0 in | Wt 257.8 lb

## 2017-10-08 DIAGNOSIS — L089 Local infection of the skin and subcutaneous tissue, unspecified: Secondary | ICD-10-CM

## 2017-10-08 DIAGNOSIS — Z951 Presence of aortocoronary bypass graft: Secondary | ICD-10-CM

## 2017-10-08 MED ORDER — CEPHALEXIN 500 MG PO CAPS: 500.0000 mg | ORAL_CAPSULE | Freq: Three times a day (TID) | ORAL | 1 refills | Status: DC

## 2017-10-08 NOTE — Progress Notes (Signed)
PCP is Sinda Du, MD Referring Provider is Arnoldo Lenis, MD  Chief Complaint  Patient presents with  . Wound Check    sternal wound, s/p CABG x3 08/29/2017    HPI: 1 week office follow-up and wound check for this 67 year old morbidly obese diabetic that is post urgent CABG x3 with superficial wound infections of the right lower leg at the vein harvest site and involving the subcutaneous fat of the lower third of the sternal incision. She has home health nursing involved but only one day a week.  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  She is taking Keflex and cultures have been submitted but no report is available.  The sternal incision is being packed with quarter inch strip of Nu Gauze into the subcutaneous space and the leg open dermal wound is being packed with saline wet-to-dry.  The wounds appear to be improving on today's visit. Past Medical History:  Diagnosis Date  . Anemia   . Anxiety   . Bipolar disorder ([place])   . Bulging lumbar disc    L3-4  . Chronic daily headache   . Chronic low back pain 09/20/2014  . COPD (chronic obstructive pulmonary disease) ([place])   . Degenerative arthritis   . Depression   . Diabetes mellitus, type II ([place])   . DM type 2 with diabetic peripheral neuropathy ([place]) 09/20/2014  . Dyslipidemia   . Dyspnea   . Gastroparesis   . GERD (gastroesophageal reflux disease)   . Heart murmur   . History of hiatal hernia   . Hypothyroidism   . IBS (irritable bowel syndrome)   . Memory difficulty 09/20/2014  . Morbid obesity ([place])   . Neuropathy   . Obstructive sleep apnea on CPAP   . Restless legs syndrome (RLS) 09/17/2012  . Stroke (cerebrum) ([place]) 05/16/2017   Left parietal    Past Surgical History:  Procedure Laterality Date  . ABDOMINAL HYSTERECTOMY    . ARTHROSCOPY KNEE W/ DRILLING  06/2011   and Decemer of 2013.  [name] Carpel tunnel  1980's  . CATARACT EXTRACTION W/PHACO Right 03/21/2017   Procedure: CATARACT EXTRACTION  PHACO AND INTRAOCULAR LENS PLACEMENT RIGHT EYE;  Surgeon: Baruch Goldmann, MD;  [facility]: [facility];  Service: Ophthalmology;  Laterality: Right;  CDE: 2.91   . CATARACT EXTRACTION W/PHACO Left 04/18/2017   Procedure: CATARACT EXTRACTION PHACO AND INTRAOCULAR LENS PLACEMENT LEFT EYE;  Surgeon: Baruch Goldmann, MD;  [facility]: [facility];  Service: Ophthalmology;  Laterality: Left;  left  . CHOLECYSTECTOMY    . COLONOSCOPY WITH PROPOFOL N/A 10/04/2016   Procedure: COLONOSCOPY WITH PROPOFOL;  Surgeon: Rogene Houston, MD;  [facility];  Service: Endoscopy;  Laterality: N/A;  11:10  . CORONARY ARTERY BYPASS GRAFT N/A 08/29/2017   Procedure: CORONARY ARTERY BYPASS GRAFTING (CABG) x 3 WITH ENDOSCOPIC HARVESTING OF RIGHT SAPHENOUS VEIN;  Surgeon: Ivin Poot, MD;  Location: [place];  Service: Open Heart Surgery;  Laterality: N/A;  . ESOPHAGOGASTRODUODENOSCOPY N/A 04/25/2016   Procedure: ESOPHAGOGASTRODUODENOSCOPY (EGD);  Surgeon: Rogene Houston, MD;  [facility];  Service: Endoscopy;  Laterality: N/A;  730  . LEFT HEART CATH AND CORONARY ANGIOGRAPHY N/A 08/27/2017   Procedure: LEFT HEART CATH AND CORONARY ANGIOGRAPHY;  Surgeon: Sherren Mocha, MD;  Location: [place] [facility];  Service: Cardiovascular::  pLAD 95% - p-mLAD 50%, ostD1 90% -pD1 80%; ostOM1 90%; rPDA 80%. EF ~50-55% - HK of dital Anterolateral & Apical  wall.  - Rec CVTS c/s  . RECTAL SURGERY     fissure  . SHOULDER SURGERY Left    arthroscopy in March of this year  . TEE WITHOUT CARDIOVERSION N/A 08/29/2017   Procedure: TRANSESOPHAGEAL ECHOCARDIOGRAM (TEE);  Surgeon: Prescott Gum, Collier Salina, MD;  Location: [place];  Service: Open Heart Surgery;  Laterality: N/A;  . TRANSTHORACIC ECHOCARDIOGRAM  06/06/2017   Mild to moderate reduced EF 40 and 45%.  Anterior septal, inferoseptal and basal to mid inferior hypokinesis.  GR 1 DD.  No significant valvular lesion    Family History  Problem Relation Age of Onset  . Hypertension Mother    . Lymphoma Mother   . Depression Mother   . Arthritis Father   . Alcohol abuse Father   . Hypertension Sister   . Cancer Brother        kidney and lung  . Alcohol abuse Brother   . Alcohol abuse Paternal Uncle   . Alcohol abuse Paternal Grandfather   . Alcohol abuse Paternal Grandmother     Social History Social History   Tobacco Use  . Smoking status: Former Smoker    Types: Cigarettes    Last attempt to quit: 02/04/1971    Years since quitting: 46.7  . Smokeless tobacco: Never Used  . Tobacco comment: smoked 2 cigarettes a day  Substance Use Topics  . Alcohol use: No    Alcohol/week: 0.0 oz  . Drug use: No    Current Outpatient Medications  Medication Sig Dispense Refill  . acetaminophen (TYLENOL) 650 MG CR tablet Take 650 mg by mouth every 6 (six) hours as needed for pain.    [name] albuterol (PROVENTIL HFA;VENTOLIN HFA) 108 (90 Base) MCG/ACT inhaler Inhale 2 puffs into the lungs every 6 (six) hours as needed for wheezing or shortness of breath.    [name] amiodarone (PACERONE) 200 MG tablet Take 1 tablet (200 mg total) by mouth daily. 30 tablet 1  . aspirin EC 325 MG EC tablet Take 1 tablet (325 mg total) by mouth daily. 30 tablet 0  . atorvastatin (LIPITOR) 10 MG tablet TAKE ONE TABLET BY MOUTH AT BEDTIME 30 tablet 3  . baclofen (LIORESAL) 10 MG tablet TAKE ONE TABLET BY MOUTH AT BEDTIME. 30 tablet 1  . buPROPion (WELLBUTRIN XL) 300 MG 24 hr tablet TAKE ONE TABLET BY MOUTH EVERY MORNING. 30 tablet 2  . calcium carbonate (TUMS - DOSED IN MG ELEMENTAL CALCIUM) 500 MG chewable tablet Chew 1 tablet by mouth as needed for indigestion or heartburn.    . cephALEXin (KEFLEX) 500 MG capsule Take 1 capsule (500 mg total) by mouth 3 (three) times daily. 21 capsule 0  . EPINEPHrine (EPIPEN 2-PAK) 0.3 mg/0.3 mL IJ SOAJ injection Inject 0.3 mg into the muscle once.    [name] esomeprazole (NEXIUM) 40 MG capsule Take 40 mg by mouth 2 (two) times daily as needed.   12  . ferrous sulfate 325 (65 FE)  MG tablet Take 325 mg by mouth 2 (two) times daily with a meal.    . furosemide (LASIX) 40 MG tablet Take 40 mg by mouth as needed for fluid or edema.    . gabapentin (NEURONTIN) 300 MG capsule Take 1 capsule (300 mg total) by mouth 2 (two) times daily.    . hydrOXYzine (ATARAX/VISTARIL) 10 MG tablet Take 1 tablet (10 mg total) by mouth 3 (three) times daily as needed for itching. 30 tablet 0  . Insulin Glargine (TOUJEO SOLOSTAR) 300 UNIT/ML SOPN  Inject 40 Units into the skin at bedtime. 6 pen 2  . lamoTRIgine (LAMICTAL) 150 MG tablet TAKE ONE TABLET BY MOUTH AT BEDTIME. 30 tablet 2  . levothyroxine (SYNTHROID, LEVOTHROID) 100 MCG tablet Take 100 mcg by mouth daily before breakfast.     . meclizine (ANTIVERT) 25 MG tablet Take 1 tablet (25 mg total) by mouth 3 (three) times daily as needed for dizziness. (Patient taking differently: Take 25 mg by mouth daily as needed for dizziness. ) 30 tablet 0  . metFORMIN (GLUCOPHAGE) 1000 MG tablet Take 1,000 mg by mouth 2 (two) times daily with a meal.    . Polyvinyl Alcohol-Povidone (REFRESH OP) Place 2 drops into both eyes daily as needed (for dry eyes).    [name] POTASSIUM CHLORIDE PO Take 20 mEq by mouth daily.    . potassium chloride SA (K-DUR,KLOR-CON) 20 MEQ tablet TAKE ONE TABLET BY MOUTH DAILY 30 tablet 3  . rOPINIRole (REQUIP) 2 MG tablet Take 2 mg by mouth 3 (three) times daily. .    . traMADol (ULTRAM) 50 MG tablet Take 100 mg by mouth 4 (four) times daily as needed.     . traZODone (DESYREL) 50 MG tablet Take 50 mg by mouth at bedtime.  5  . venlafaxine XR (EFFEXOR-XR) 150 MG 24 hr capsule TAKE TWO (2) CAPSULES BY MOUTH AT BEDTIME. (Patient taking differently: TAKE 300 MG BY MOUTH AT BEDTIME) 60 capsule 2   No current facility-administered medications for this visit.     Allergies  Allergen Reactions  . Amoxicillin Anaphylaxis and Other (See Comments)    Has patient had a PCN reaction causing immediate rash, facial/tongue/throat swelling, SOB or  lightheadedness with hypotension: Yes Has patient had a PCN reaction causing severe rash involving mucus membranes or skin necrosis: Yes Has patient had a PCN reaction that required hospitalization: Yes Has patient had a PCN reaction occurring within the last 10 years: Yes If all of the above answers are "NO", then may proceed with Cephalosporin use.   [name] Hydrocodone Anaphylaxis  . Levaquin [Levofloxacin] Rash  . Depacon [Valproic Acid] Other (See Comments)    Causes falls     Review of Systems  No fever Minimal wound drainage  BP (!) 150/72 (BP Location: Left Arm, Patient Position: Sitting, Cuff Size: Large)   Pulse 94   Resp 18   Ht 5\' 3"  (1.6 m)   Wt 257 lb 12.8 oz (116.9 kg)   SpO2 95% Comment: RA  BMI 45.67 kg/m  Physical Exam Alert and responsive but somewhat depressed Lungs clear Sternum stable small opening in the skin packed with quarter inch iodoform gauze personally Heart rhythm regular no murmur gallop Leg incision inspected and cleaned with peroxide saline and packed 2 x 2 wet-to-dry dressing  Diagnostic Tests: Wound culture pending Impression: Superficial wound infections, continue Keflex pending results of wound culture and respond appropriately  Plan: Weekly office visits to monitor the 2 wounds. Continue another week of oral Keflex.   Len Childs, MD [facility] [phone]

## 2017-10-09 ENCOUNTER — Telehealth: Payer: Self-pay

## 2017-10-09 DIAGNOSIS — I2510 Atherosclerotic heart disease of native coronary artery without angina pectoris: Secondary | ICD-10-CM | POA: Diagnosis not present

## 2017-10-09 DIAGNOSIS — E1169 Type 2 diabetes mellitus with other specified complication: Secondary | ICD-10-CM | POA: Diagnosis not present

## 2017-10-09 DIAGNOSIS — F319 Bipolar disorder, unspecified: Secondary | ICD-10-CM | POA: Diagnosis not present

## 2017-10-09 DIAGNOSIS — J449 Chronic obstructive pulmonary disease, unspecified: Secondary | ICD-10-CM | POA: Diagnosis not present

## 2017-10-09 NOTE — Telephone Encounter (Signed)
Ms. Vanderloop called and stated that after speaking with Dr. Prescott Gum yesterday, [place] PT/OT was to be discontinued.  She also requested for nursing with [facility] to come out more frequently.  After Speaking with Anderson Malta, Case Manager with the patient, she stated that nursing would not be able to come out more often due to insurance reasons, Ms. Grumbine was updated on the information.  Anderson Malta with [facility] was given verbal orders for discontinuation of PT/OT orders and also verbalized correct dressing changes for Ms. Girardin's wounds.  Will continue to monitor.

## 2017-10-10 DIAGNOSIS — F319 Bipolar disorder, unspecified: Secondary | ICD-10-CM | POA: Diagnosis not present

## 2017-10-10 DIAGNOSIS — E039 Hypothyroidism, unspecified: Secondary | ICD-10-CM | POA: Diagnosis not present

## 2017-10-10 DIAGNOSIS — E785 Hyperlipidemia, unspecified: Secondary | ICD-10-CM | POA: Diagnosis not present

## 2017-10-10 DIAGNOSIS — I2510 Atherosclerotic heart disease of native coronary artery without angina pectoris: Secondary | ICD-10-CM | POA: Diagnosis not present

## 2017-10-10 DIAGNOSIS — Z951 Presence of aortocoronary bypass graft: Secondary | ICD-10-CM | POA: Diagnosis not present

## 2017-10-10 DIAGNOSIS — Z48812 Encounter for surgical aftercare following surgery on the circulatory system: Secondary | ICD-10-CM | POA: Diagnosis not present

## 2017-10-10 DIAGNOSIS — G2581 Restless legs syndrome: Secondary | ICD-10-CM | POA: Diagnosis not present

## 2017-10-14 ENCOUNTER — Ambulatory Visit (INDEPENDENT_AMBULATORY_CARE_PROVIDER_SITE_OTHER): Payer: Self-pay | Admitting: Cardiothoracic Surgery

## 2017-10-14 ENCOUNTER — Encounter: Payer: Self-pay | Admitting: Cardiothoracic Surgery

## 2017-10-14 ENCOUNTER — Other Ambulatory Visit: Payer: Self-pay

## 2017-10-14 VITALS — BP 138/80 | HR 95 | Temp 98.3°F | Resp 18 | Ht 63.0 in | Wt 250.2 lb

## 2017-10-14 DIAGNOSIS — T8132XD Disruption of internal operation (surgical) wound, not elsewhere classified, subsequent encounter: Secondary | ICD-10-CM

## 2017-10-14 DIAGNOSIS — Z951 Presence of aortocoronary bypass graft: Secondary | ICD-10-CM

## 2017-10-14 MED ORDER — DOXYCYCLINE HYCLATE 100 MG PO TABS: 100.0000 mg | ORAL_TABLET | Freq: Two times a day (BID) | ORAL | 0 refills | Status: DC

## 2017-10-14 NOTE — Progress Notes (Signed)
PCP is Sinda Du, MD Referring Provider is Arnoldo Lenis, MD  Chief Complaint  Patient presents with  . Wound Check    sternal wound/ EVH site, s/p CABG x3 08/29/2017    HPI: 1 week wound check The patient has developed a subcutaneous pocket in her sternal incision.  She is morbidly obese with a thick layer of fat overlying the sternum.  The tunnel goes approximately 2-1/2 to 3 cm and is horizontal and does not go deep to the sternal bone.  It is clean but not healing very quickly.  We will add zinc to her medication list to help granulate the wound in.  She is changing it daily with Nu Gauze dressing.  A home health nurse is monitoring her weekly.  We are seeing her on a weekly basis as well.  She has finished her course of Keflex and will start a course of oral doxycycline 100 mg daily.  Wound cultures have been negative consistently.   Past Medical History:  Diagnosis Date  . Anemia   . Anxiety   . Bipolar disorder ([place])   . Bulging lumbar disc    L3-4  . Chronic daily headache   . Chronic low back pain 09/20/2014  . COPD (chronic obstructive pulmonary disease) ([place])   . Degenerative arthritis   . Depression   . Diabetes mellitus, type II ([place])   . DM type 2 with diabetic peripheral neuropathy ([place]) 09/20/2014  . Dyslipidemia   . Dyspnea   . Gastroparesis   . GERD (gastroesophageal reflux disease)   . Heart murmur   . History of hiatal hernia   . Hypothyroidism   . IBS (irritable bowel syndrome)   . Memory difficulty 09/20/2014  . Morbid obesity ([place])   . Neuropathy   . Obstructive sleep apnea on CPAP   . Restless legs syndrome (RLS) 09/17/2012  . Stroke (cerebrum) ([place]) 05/16/2017   Left parietal    Past Surgical History:  Procedure Laterality Date  . ABDOMINAL HYSTERECTOMY    . ARTHROSCOPY KNEE W/ DRILLING  06/2011   and Decemer of 2013.  [name] Carpel tunnel  1980's  . CATARACT EXTRACTION W/PHACO Right 03/21/2017   Procedure: CATARACT EXTRACTION PHACO AND  INTRAOCULAR LENS PLACEMENT RIGHT EYE;  Surgeon: Baruch Goldmann, MD;  [facility];  Service: Ophthalmology;  Laterality: Right;  CDE: 2.91   . CATARACT EXTRACTION W/PHACO Left 04/18/2017   Procedure: CATARACT EXTRACTION PHACO AND INTRAOCULAR LENS PLACEMENT LEFT EYE;  Surgeon: Baruch Goldmann, MD;  [facility];  Service: Ophthalmology;  Laterality: Left;  left  . CHOLECYSTECTOMY    . COLONOSCOPY WITH PROPOFOL N/A 10/04/2016   Procedure: COLONOSCOPY WITH PROPOFOL;  Surgeon: Rogene Houston, MD;  [facility];  Service: Endoscopy;  Laterality: N/A;  11:10  . CORONARY ARTERY BYPASS GRAFT N/A 08/29/2017   Procedure: CORONARY ARTERY BYPASS GRAFTING (CABG) x 3 WITH ENDOSCOPIC HARVESTING OF RIGHT SAPHENOUS VEIN;  Surgeon: Ivin Poot, MD;  Location: [place];  Service: Open Heart Surgery;  Laterality: N/A;  . ESOPHAGOGASTRODUODENOSCOPY N/A 04/25/2016   Procedure: ESOPHAGOGASTRODUODENOSCOPY (EGD);  Surgeon: Rogene Houston, MD;  [facility];  Service: Endoscopy;  Laterality: N/A;  730  . LEFT HEART CATH AND CORONARY ANGIOGRAPHY N/A 08/27/2017   Procedure: LEFT HEART CATH AND CORONARY ANGIOGRAPHY;  Surgeon: Sherren Mocha, MD;  Location: [place] [facility];  Service: Cardiovascular::  pLAD 95% - p-mLAD 50%, ostD1 90% -pD1 80%; ostOM1 90%; rPDA 80%. EF ~50-55% -  HK of dital Anterolateral & Apical wall.  - Rec CVTS c/s  . RECTAL SURGERY     fissure  . SHOULDER SURGERY Left    arthroscopy in March of this year  . TEE WITHOUT CARDIOVERSION N/A 08/29/2017   Procedure: TRANSESOPHAGEAL ECHOCARDIOGRAM (TEE);  Surgeon: Prescott Gum, Collier Salina, MD;  Location: [place];  Service: Open Heart Surgery;  Laterality: N/A;  . TRANSTHORACIC ECHOCARDIOGRAM  06/06/2017   Mild to moderate reduced EF 40 and 45%.  Anterior septal, inferoseptal and basal to mid inferior hypokinesis.  GR 1 DD.  No significant valvular lesion    Family History  Problem Relation Age of Onset  . Hypertension Mother   .  Lymphoma Mother   . Depression Mother   . Arthritis Father   . Alcohol abuse Father   . Hypertension Sister   . Cancer Brother        kidney and lung  . Alcohol abuse Brother   . Alcohol abuse Paternal Uncle   . Alcohol abuse Paternal Grandfather   . Alcohol abuse Paternal Grandmother     Social History Social History   Tobacco Use  . Smoking status: Former Smoker    Types: Cigarettes    Last attempt to quit: 02/04/1971    Years since quitting: 46.7  . Smokeless tobacco: Never Used  . Tobacco comment: smoked 2 cigarettes a day  Substance Use Topics  . Alcohol use: No    Alcohol/week: 0.0 oz  . Drug use: No    Current Outpatient Medications  Medication Sig Dispense Refill  . acetaminophen (TYLENOL) 650 MG CR tablet Take 650 mg by mouth every 6 (six) hours as needed for pain.    [name] albuterol (PROVENTIL HFA;VENTOLIN HFA) 108 (90 Base) MCG/ACT inhaler Inhale 2 puffs into the lungs every 6 (six) hours as needed for wheezing or shortness of breath.    [name] amiodarone (PACERONE) 200 MG tablet Take 1 tablet (200 mg total) by mouth daily. 30 tablet 1  . aspirin EC 325 MG EC tablet Take 1 tablet (325 mg total) by mouth daily. 30 tablet 0  . atorvastatin (LIPITOR) 10 MG tablet TAKE ONE TABLET BY MOUTH AT BEDTIME 30 tablet 3  . baclofen (LIORESAL) 10 MG tablet TAKE ONE TABLET BY MOUTH AT BEDTIME. 30 tablet 1  . buPROPion (WELLBUTRIN XL) 300 MG 24 hr tablet TAKE ONE TABLET BY MOUTH EVERY MORNING. 30 tablet 2  . calcium carbonate (TUMS - DOSED IN MG ELEMENTAL CALCIUM) 500 MG chewable tablet Chew 1 tablet by mouth as needed for indigestion or heartburn.    . cephALEXin (KEFLEX) 500 MG capsule Take 1 capsule (500 mg total) by mouth 3 (three) times daily. 21 capsule 1  . EPINEPHrine (EPIPEN 2-PAK) 0.3 mg/0.3 mL IJ SOAJ injection Inject 0.3 mg into the muscle once.    [name] esomeprazole (NEXIUM) 40 MG capsule Take 40 mg by mouth 2 (two) times daily as needed.   12  . ferrous sulfate 325 (65 FE) MG  tablet Take 325 mg by mouth 2 (two) times daily with a meal.    . furosemide (LASIX) 40 MG tablet Take 40 mg by mouth as needed for fluid or edema.    . gabapentin (NEURONTIN) 300 MG capsule Take 1 capsule (300 mg total) by mouth 2 (two) times daily.    . hydrOXYzine (ATARAX/VISTARIL) 10 MG tablet Take 1 tablet (10 mg total) by mouth 3 (three) times daily as needed for itching. 30 tablet 0  . Insulin  Glargine (TOUJEO SOLOSTAR) 300 UNIT/ML SOPN Inject 40 Units into the skin at bedtime. 6 pen 2  . lamoTRIgine (LAMICTAL) 150 MG tablet TAKE ONE TABLET BY MOUTH AT BEDTIME. 30 tablet 2  . levothyroxine (SYNTHROID, LEVOTHROID) 100 MCG tablet Take 100 mcg by mouth daily before breakfast.     . meclizine (ANTIVERT) 25 MG tablet Take 1 tablet (25 mg total) by mouth 3 (three) times daily as needed for dizziness. (Patient taking differently: Take 25 mg by mouth daily as needed for dizziness. ) 30 tablet 0  . metFORMIN (GLUCOPHAGE) 1000 MG tablet Take 1,000 mg by mouth 2 (two) times daily with a meal.    . Polyvinyl Alcohol-Povidone (REFRESH OP) Place 2 drops into both eyes daily as needed (for dry eyes).    [name] POTASSIUM CHLORIDE PO Take 20 mEq by mouth daily.    . potassium chloride SA (K-DUR,KLOR-CON) 20 MEQ tablet TAKE ONE TABLET BY MOUTH DAILY 30 tablet 3  . rOPINIRole (REQUIP) 2 MG tablet Take 2 mg by mouth 3 (three) times daily. .    . traMADol (ULTRAM) 50 MG tablet Take 100 mg by mouth 4 (four) times daily as needed.     . traZODone (DESYREL) 50 MG tablet Take 50 mg by mouth at bedtime.  5  . venlafaxine XR (EFFEXOR-XR) 150 MG 24 hr capsule TAKE TWO (2) CAPSULES BY MOUTH AT BEDTIME. (Patient taking differently: TAKE 300 MG BY MOUTH AT BEDTIME) 60 capsule 2   No current facility-administered medications for this visit.     Allergies  Allergen Reactions  . Amoxicillin Anaphylaxis and Other (See Comments)    Has patient had a PCN reaction causing immediate rash, facial/tongue/throat swelling, SOB or  lightheadedness with hypotension: Yes Has patient had a PCN reaction causing severe rash involving mucus membranes or skin necrosis: Yes Has patient had a PCN reaction that required hospitalization: Yes Has patient had a PCN reaction occurring within the last 10 years: Yes If all of the above answers are "NO", then may proceed with Cephalosporin use.   [name] Hydrocodone Anaphylaxis  . Levaquin [Levofloxacin] Rash  . Depacon [Valproic Acid] Other (See Comments)    Causes falls     Review of Systems  No fever Some drainage from the wound when the packing was changed by the home health nurse this week  BP 138/80 (BP Location: Left Arm, Patient Position: Sitting, Cuff Size: Large)   Pulse 95   Temp 98.3 F (36.8 C)   Resp 18   Ht 5\' 3"  (1.6 m)   Wt 250 lb 3.2 oz (113.5 kg)   SpO2 97% Comment: RA  BMI 44.32 kg/m  Physical Exam Alert and comfortable Lungs clear Heart rate regular Sternum stable Wound repacked personally approximately 2.5 to 3 cm deep but clean Minimal pedal edema  Diagnostic Tests: None  Impression: Superficial sternal wound infection with subcutaneous short defect  Plan: Continue current topical wound care therapy and start a course of oral doxycycline.  Return in 1 week.   Len Childs, MD [facility] [phone]

## 2017-10-15 ENCOUNTER — Ambulatory Visit: Payer: Self-pay | Admitting: Cardiothoracic Surgery

## 2017-10-20 ENCOUNTER — Encounter: Payer: Self-pay | Admitting: Physician Assistant

## 2017-10-20 ENCOUNTER — Ambulatory Visit: Payer: PPO | Admitting: Physician Assistant

## 2017-10-20 ENCOUNTER — Ambulatory Visit: Payer: Self-pay | Admitting: Student

## 2017-10-20 VITALS — BP 150/74 | HR 96 | Ht 63.0 in | Wt 258.0 lb

## 2017-10-20 DIAGNOSIS — E782 Mixed hyperlipidemia: Secondary | ICD-10-CM | POA: Diagnosis not present

## 2017-10-20 DIAGNOSIS — Z8673 Personal history of transient ischemic attack (TIA), and cerebral infarction without residual deficits: Secondary | ICD-10-CM

## 2017-10-20 DIAGNOSIS — E1169 Type 2 diabetes mellitus with other specified complication: Secondary | ICD-10-CM | POA: Diagnosis not present

## 2017-10-20 DIAGNOSIS — I10 Essential (primary) hypertension: Secondary | ICD-10-CM | POA: Diagnosis not present

## 2017-10-20 DIAGNOSIS — I2510 Atherosclerotic heart disease of native coronary artery without angina pectoris: Secondary | ICD-10-CM | POA: Diagnosis not present

## 2017-10-20 DIAGNOSIS — I429 Cardiomyopathy, unspecified: Secondary | ICD-10-CM | POA: Diagnosis not present

## 2017-10-20 DIAGNOSIS — F319 Bipolar disorder, unspecified: Secondary | ICD-10-CM | POA: Diagnosis not present

## 2017-10-20 DIAGNOSIS — Z951 Presence of aortocoronary bypass graft: Secondary | ICD-10-CM | POA: Diagnosis not present

## 2017-10-20 DIAGNOSIS — J449 Chronic obstructive pulmonary disease, unspecified: Secondary | ICD-10-CM | POA: Diagnosis not present

## 2017-10-20 MED ORDER — CARVEDILOL 3.125 MG PO TABS: 3.1250 mg | ORAL_TABLET | Freq: Two times a day (BID) | ORAL | 11 refills | Status: DC

## 2017-10-20 MED ORDER — LOSARTAN POTASSIUM 25 MG PO TABS: 25.0000 mg | ORAL_TABLET | Freq: Every day | ORAL | 3 refills | Status: DC

## 2017-10-20 NOTE — Progress Notes (Signed)
Cardiology Office Note    Date:  10/20/2017   ID:  Norma [name], DOB 11/26/1950, MRN [id number]  PCP:  Sinda Du, MD  Cardiologist: Carlyle Dolly, MD  Chief Complaint  Patient presents with  . Leg Swelling    History of Present Illness:  Norma [name] is a 67 y.o. female with history of CAD found to have three-vessel disease on cath in 08/2017 during admission for unstable angina.  She underwent CABG times 35/18/19 with SVG to the diagonal and LAD, SVG to OM.  She had postop atrial fibrillation treated with amiodarone, superficial sternal wound infection on antibiotics and followed closely by Dr. Darcey Nora.  2D echo 05/2017 LVEF 40 to 45%.  Saw Dr. Laruth Bouchard 09/24/2017 at which time amiodarone was reduced to 200 mg daily with plans to stop at next office visit.  She was also having significant fatigue felt secondary to the beta-blocker and Lopressor was stopped.  Patient comes in today complaining of fatigue, leg swelling and shortness of breath. She was told by Dr. Darcey Nora to take her lasix every other day. She ate a frozen dinner last night. Says she gained 8 lbs but lost 2 lbs overnight. BP up today. Used to be on cozaar 50 mg daily but says it was stopped when she was put on metoprolol.  She says her fatigue improved a little bit when the metoprolol was stopped but she still very fatigued.    Past Medical History:  Diagnosis Date  . Anemia   . Anxiety   . Bipolar disorder ([place])   . Bulging lumbar disc    L3-4  . Chronic daily headache   . Chronic low back pain 09/20/2014  . COPD (chronic obstructive pulmonary disease) ([place])   . Degenerative arthritis   . Depression   . Diabetes mellitus, type II ([place])   . DM type 2 with diabetic peripheral neuropathy ([place]) 09/20/2014  . Dyslipidemia   . Dyspnea   . Gastroparesis   . GERD (gastroesophageal reflux disease)   . Heart murmur   . History of hiatal hernia   . Hypothyroidism   . IBS (irritable bowel syndrome)   . Memory  difficulty 09/20/2014  . Morbid obesity ([place])   . Neuropathy   . Obstructive sleep apnea on CPAP   . Restless legs syndrome (RLS) 09/17/2012  . Stroke (cerebrum) ([place]) 05/16/2017   Left parietal    Past Surgical History:  Procedure Laterality Date  . ABDOMINAL HYSTERECTOMY    . ARTHROSCOPY KNEE W/ DRILLING  06/2011   and Dece[place] of 2013.  [name] Carpel tunnel  1980's  . CATARACT EXTRACTION W/PHACO Right 03/21/2017   Procedure: CATARACT EXTRACTION PHACO AND INTRAOCULAR LENS PLACEMENT RIGHT EYE;  Surgeon: Baruch Goldmann, MD;  Location: [facility];  Service: Ophthalmology;  Laterality: Right;  CDE: 2.91   . CATARACT EXTRACTION W/PHACO Left 04/18/2017   Procedure: CATARACT EXTRACTION PHACO AND INTRAOCULAR LENS PLACEMENT LEFT EYE;  Surgeon: Baruch Goldmann, MD;  Location: [facility];  Service: Ophthalmology;  Laterality: Left;  left  . CHOLECYSTECTOMY    . COLONOSCOPY WITH PROPOFOL N/A 10/04/2016   Procedure: COLONOSCOPY WITH PROPOFOL;  Surgeon: Rogene Houston, MD;  Location: [facility];  Service: Endoscopy;  Laterality: N/A;  11:10  . CORONARY ARTERY BYPASS GRAFT N/A 08/29/2017   Procedure: CORONARY ARTERY BYPASS GRAFTING (CABG) x 3 WITH ENDOSCOPIC HARVESTING OF RIGHT SAPHENOUS VEIN;  Surgeon: Ivin Poot, MD;  Location: [facility];  Service: Open Heart Surgery;  Laterality:  N/A;  . ESOPHAGOGASTRODUODENOSCOPY N/A 04/25/2016   Procedure: ESOPHAGOGASTRODUODENOSCOPY (EGD);  Surgeon: Rogene Houston, MD;  [facility];  Service: Endoscopy;  Laterality: N/A;  730  . LEFT HEART CATH AND CORONARY ANGIOGRAPHY N/A 08/27/2017   Procedure: LEFT HEART CATH AND CORONARY ANGIOGRAPHY;  Surgeon: Sherren Mocha, MD;  Location: [facility];  Service: Cardiovascular::  pLAD 95% - p-mLAD 50%, ostD1 90% -pD1 80%; ostOM1 90%; rPDA 80%. EF ~50-55% - HK of dital Anterolateral & Apical wall.  - Rec CVTS c/s  . RECTAL SURGERY     fissure  . SHOULDER SURGERY Left    arthroscopy in March of this year  . TEE WITHOUT  CARDIOVERSION N/A 08/29/2017   Procedure: TRANSESOPHAGEAL ECHOCARDIOGRAM (TEE);  Surgeon: Prescott Gum, Collier Salina, MD;  Location: [facility];  Service: Open Heart Surgery;  Laterality: N/A;  . TRANSTHORACIC ECHOCARDIOGRAM  06/06/2017   Mild to moderate reduced EF 40 and 45%.  Anterior septal, inferoseptal and basal to mid inferior hypokinesis.  GR 1 DD.  No significant valvular lesion    Current Medications: Current Meds  Medication Sig  . acetaminophen (TYLENOL) 650 MG CR tablet Take 650 mg by mouth every 6 (six) hours as needed for pain.  [name] albuterol (PROVENTIL HFA;VENTOLIN HFA) 108 (90 Base) MCG/ACT inhaler Inhale 2 puffs into the lungs every 6 (six) hours as needed for wheezing or shortness of breath.  [name] amiodarone (PACERONE) 200 MG tablet Take 1 tablet (200 mg total) by mouth daily.  [name] aspirin EC 325 MG EC tablet Take 1 tablet (325 mg total) by mouth daily.  [name] atorvastatin (LIPITOR) 10 MG tablet TAKE ONE TABLET BY MOUTH AT BEDTIME  . baclofen (LIORESAL) 10 MG tablet TAKE ONE TABLET BY MOUTH AT BEDTIME.  [name] buPROPion (WELLBUTRIN XL) 300 MG 24 hr tablet TAKE ONE TABLET BY MOUTH EVERY MORNING.  . calcium carbonate (TUMS - DOSED IN MG ELEMENTAL CALCIUM) 500 MG chewable tablet Chew 1 tablet by mouth as needed for indigestion or heartburn.  . doxycycline (VIBRA-TABS) 100 MG tablet Take 1 tablet (100 mg total) by mouth 2 (two) times daily.  [name] EPINEPHrine (EPIPEN 2-PAK) 0.3 mg/0.3 mL IJ SOAJ injection Inject 0.3 mg into the muscle once.  [name] esomeprazole (NEXIUM) 40 MG capsule Take 40 mg by mouth 2 (two) times daily as needed.   . ferrous sulfate 325 (65 FE) MG tablet Take 325 mg by mouth 2 (two) times daily with a meal.  . furosemide (LASIX) 40 MG tablet Take 40 mg by mouth as needed for fluid or edema.  . gabapentin (NEURONTIN) 300 MG capsule Take 1 capsule (300 mg total) by mouth 2 (two) times daily.  . hydrOXYzine (ATARAX/VISTARIL) 10 MG tablet Take 1 tablet (10 mg total) by mouth 3 (three) times daily as  needed for itching.  . Insulin Glargine (TOUJEO SOLOSTAR) 300 UNIT/ML SOPN Inject 40 Units into the skin at bedtime.  . lamoTRIgine (LAMICTAL) 150 MG tablet TAKE ONE TABLET BY MOUTH AT BEDTIME.  [name] levothyroxine (SYNTHROID, LEVOTHROID) 100 MCG tablet Take 100 mcg by mouth daily before breakfast.   . meclizine (ANTIVERT) 25 MG tablet Take 1 tablet (25 mg total) by mouth 3 (three) times daily as needed for dizziness. (Patient taking differently: Take 25 mg by mouth daily as needed for dizziness. )  . metFORMIN (GLUCOPHAGE) 1000 MG tablet Take 1,000 mg by mouth 2 (two) times daily with a meal.  . Polyvinyl Alcohol-Povidone (REFRESH OP) Place 2 drops into both eyes  daily as needed (for dry eyes).  [name] POTASSIUM CHLORIDE PO Take 20 mEq by mouth daily.  . potassium chloride SA (K-DUR,KLOR-CON) 20 MEQ tablet TAKE ONE TABLET BY MOUTH DAILY  . rOPINIRole (REQUIP) 2 MG tablet Take 2 mg by mouth 3 (three) times daily. .  . traMADol (ULTRAM) 50 MG tablet Take 100 mg by mouth 4 (four) times daily as needed.   . traZODone (DESYREL) 50 MG tablet Take 50 mg by mouth at bedtime.  [name] venlafaxine XR (EFFEXOR-XR) 150 MG 24 hr capsule TAKE TWO (2) CAPSULES BY MOUTH AT BEDTIME. (Patient taking differently: TAKE 300 MG BY MOUTH AT BEDTIME)     Allergies:   Amoxicillin; Hydrocodone; Levaquin [levofloxacin]; and Depacon [valproic acid]   Social History   Socioeconomic History  . Marital status: Single    Spouse name: Not on file  . Number of children: 0  . Years of education: 44  . Highest education level: Not on file  Occupational History    Employer: [employer]  . Financial resource strain: Not on file  . Food insecurity:    Worry: Not on file    Inability: Not on file  . Transportation needs:    Medical: Not on file    Non-medical: Not on file  Tobacco Use  . Smoking status: Former Smoker    Types: Cigarettes    Last attempt to quit: 02/04/1971    Years since quitting: 46.7  .  Smokeless tobacco: Never Used  . Tobacco comment: smoked 2 cigarettes a day  Substance and Sexual Activity  . Alcohol use: No    Alcohol/week: 0.0 oz  . Drug use: No  . Sexual activity: Never  Lifestyle  . Physical activity:    Days per week: Not on file    Minutes per session: Not on file  . Stress: Not on file  Relationships  . Social connections:    Talks on phone: Not on file    Gets together: Not on file    Attends religious service: Not on file    Active member of club or organization: Not on file    Attends meetings of clubs or organizations: Not on file    Relationship status: Not on file  Other Topics Concern  . Not on file  Social History Narrative   Patient lives at home alone.    Patient has no children.    Patient has her masters in nursing.    Patient is single.    Patient drinks about 2 glasses of tea daily.   Patient is right handed.     Family History:  The patient's family history includes Alcohol abuse in her brother, father, paternal grandfather, paternal grandmother, and paternal uncle; Arthritis in her father; Cancer in her brother; Depression in her mother; Hypertension in her mother and sister; Lymphoma in her mother.   ROS:   Please see the history of present illness.    Review of Systems  Constitution: Positive for malaise/fatigue and weight gain.  HENT: Positive for hearing loss.   Eyes: Positive for visual disturbance.  Cardiovascular: Positive for dyspnea on exertion.  Respiratory: Positive for snoring.   Hematologic/Lymphatic: Negative.   Musculoskeletal: Positive for back pain and myalgias. Negative for joint pain.  Gastrointestinal: Negative.   Genitourinary: Negative.   Neurological: Positive for dizziness and headaches.  Psychiatric/Behavioral: Positive for depression.   All other systems reviewed and are negative.   PHYSICAL EXAM:   VS:  BP [name])  150/74   Pulse 96   Ht 5\' 3"  (1.6 m)   Wt 258 lb (117 kg)   SpO2 97%   BMI 45.70  kg/m   Physical Exam  GEN: Well nourished, well developed, in no acute distress  Neck: no JVD, carotid bruits, or masses Cardiac:RRR; no murmurs, rubs, or gallops  Respiratory: Decreased breath sounds but clear to auscultation bilaterally, normal work of breathing GI: soft, nontender, nondistended, + BS Ext: Trace of edema bilaterally without cyanosis, clubbing,  Good distal pulses bilaterally Neuro:  Alert and Oriented x 3 Psych: euthymic mood, full affect  Wt Readings from Last 3 Encounters:  10/20/17 258 lb (117 kg)  10/14/17 250 lb 3.2 oz (113.5 kg)  10/08/17 257 lb 12.8 oz (116.9 kg)      Studies/Labs Reviewed:   EKG:  EKG is  ordered today.  EKG shows normal sinus rhythm at 96 bpm with left bundle branch block some ST-T wave changes similar to prior EKG when her heart rate was faster. Recent Labs: 07/11/2017: TSH 3.71 08/30/2017: Magnesium 2.0 09/05/2017: ALT 14; BUN 11; Creatinine, Ser 0.87; Hemoglobin 8.8; Platelets 190; Potassium 4.9; Sodium 139   Lipid Panel    Component Value Date/Time   CHOL 161 07/10/2016 0934   TRIG 153 (H) 07/10/2016 0934   HDL 47 (L) 07/10/2016 0934   CHOLHDL 3.4 07/10/2016 0934   VLDL 31 (H) 07/10/2016 0934   LDLCALC 83 07/10/2016 0934    Additional studies/ records that were reviewed today include:   03/2016 Lexiscan  Probable normal perfusion and mild soft tissue attenuation (breast) No significant ischemia or evidence for scar  The left ventricular ejection fraction is normal (55-65%).  Low risk scan.   08/2017 cath  RPDA lesion is 80% stenosed.  Ost 1st Mrg to 1st Mrg lesion is 90% stenosed.  Prox LAD lesion is 95% stenosed.  Prox LAD to Mid LAD lesion is 50% stenosed.  Ost 1st Diag lesion is 90% stenosed.  1st Diag lesion is 80% stenosed.  The left ventricular ejection fraction is 50-55% by visual estimate.  There is mild left ventricular systolic dysfunction.   1.  Severe multivessel coronary artery disease with  severe stenosis of the proximal LAD/first diagonal bifurcation, first obtuse marginal branch of the circumflex, and PDA branch of the RCA. 2.  Mild segmental LV systolic dysfunction with hypokinesis of the distal anterolateral and apical walls, LVEF estimated at 50 to 55%.   08/2017 TEE  Aortic valve: The valve is trileaflet. No stenosis. No regurgitation.  Mitral valve: Mild regurgitation. Central jet.  Right ventricle: Normal cavity size, wall thickness and ejection fraction.  Pericardium: Trivial pericardial effusion.  Tricuspid valve: No regurgitation  Pulmonic valve: No regurgitation by color doppler.  Left ventricle: Regional wall motion abnormalities present, dyskinetic basal and apical septal wall, hypokinesis of lateral wall and inferoseptal wall. Increased wall thickness. LVEF 35-40%.  No ASD/PFO present  Unable to rule out thrombus in LAA but velocities make thrombus less likely.   05/2017 echo Study Conclusions   - Left ventricle: The cavity size was normal. There was mild   concentric hypertrophy. Systolic function was mildly to   moderately reduced. The estimated ejection fraction was in the   range of 40% to 45%. Hypokinesis of the anteroseptal,   inferoseptal and basal to mid inferior myocardium. Doppler   parameters are consistent with abnormal left ventricular   relaxation (grade 1 diastolic dysfunction). Doppler parameters   are consistent with indeterminate ventricular filling  pressure. - Aortic valve: Transvalvular velocity was within the normal range.   There was no stenosis. There was no regurgitation. - Mitral valve: Transvalvular velocity was within the normal range.   There was no evidence for stenosis. There was no regurgitation. - Right ventricle: The cavity size was normal. Wall thickness was   normal. Systolic function was normal. - Tricuspid valve: There was trivial regurgitation. - Global longitudinal strain -11.9%.      ASSESSMENT:    1.  S/P CABG x 3   2. Cardiomyopathy, unspecified type ([place])   3. Essential hypertension, benign   4. Mixed hyperlipidemia   5. History of CVA (cerebrovascular accident)   6. Morbid obesity, unspecified obesity type ([place])      PLAN:  In order of problems listed above:  CAD status post CABG times three 06/5571 complicated by superficial sternal wound infection followed closely by Dr. Darcey Nora, postop atrial fibrillation has been treated with amiodarone.  She has maintained normal sinus rhythm but her heart rates a little fast today.  Will stop amiodarone and start Coreg 3.125 mg twice daily to slow her heart rate and help control her blood pressure.  She still complains of fatigue even off the beta-blocker.  Recommend cardiac rehab which she has forgotten to call them.  Cardiomyopathy ejection fraction 40 to 45% on echo 06/06/2017, Intra-Op TEE LVEF greater than 40% with vasopressor support.  Will recheck 2D echo after she has been on carvedilol and an ARB.  Essential hypertension blood pressure elevated today since off metoprolol and Cozaar.  Start Coreg 3.125 mg twice daily and Cozaar 25 mg daily.  2 g sodium diet reiterated.  Follow-up with me in 2 to 3 weeks.  Hyperlipidemia on low-dose Lipitor  Prior CVA  Morbid obesity weight loss would be very beneficial.     Medication Adjustments/Labs and Tests Ordered: Current medicines are reviewed at length with the patient today.  Concerns regarding medicines are outlined above.  Medication changes, Labs and Tests ordered today are listed in the Patient Instructions below. There are no Patient Instructions on file for this visit.   Signed, Ermalinda Barrios, PA-C  10/20/2017 2:57 PM    [facility] [address], [place], [place]  [address] Phone: [phone]; Fax: [phone]

## 2017-10-20 NOTE — Patient Instructions (Signed)
Medication Instructions:  Your physician has recommended you make the following change in your medication:  Stop Taking Amiodarone  Start Coreg 3.125 mg Two Times Daily  Start Cozaar 25 mg Daily    Labwork: NONE  Testing/Procedures: NONE   Follow-Up: Your physician recommends that you schedule a follow-up appointment in: 2-3 Weeks with Ermalinda Barrios, PA-C    Any Other Special Instructions Will Be Listed Below (If Applicable).     If you need a refill on your cardiac medications before your next appointment, please call your pharmacy.   Cooking With Less Salt Cooking with less salt is one way to reduce the amount of sodium you get from food. Depending on your condition and overall health, your health care provider or diet and nutrition specialist (dietitian) may recommend that you reduce your sodium intake. Most people should have less than 2,300 milligrams (mg) of sodium each day. If you have high blood pressure (hypertension), you may need to limit your sodium to 1,500 mg each day. Follow the tips below to help reduce your sodium intake. What do I need to know about cooking with less salt? Shopping  Buy sodium-free or low-sodium products. Look for the following words on food labels: ? Low-sodium. ? Sodium-free. ? Reduced-sodium. ? No salt added. ? Unsalted.  Buy fresh or frozen vegetables. Avoid canned vegetables.  Avoid buying meats or protein foods that have been injected with broth or saline solution.  Avoid cured or smoked meats, such as hot dogs, bacon, salami, ham, and bologna. Reading food labels  Check the food label before buying or using packaged ingredients.  Look for products with no more than 140 mg of sodium in one serving.  Do not choose foods with salt as one of the first three ingredients on the ingredients list. If salt is one of the first three ingredients, it usually means the item is high in sodium, because ingredients are listed in order of  amount in the food item. Cooking  Use herbs, seasonings without salt, and spices as substitutes for salt in foods.  Use sodium-free baking soda when baking.  Grill, braise, or roast foods to add flavor with less salt.  Avoid adding salt to pasta, rice, or hot cereals while cooking.  Drain and rinse canned vegetables before use.  Avoid adding salt when cooking sweets and desserts.  Cook with low-sodium ingredients. What are some salt alternatives? The following are herbs, seasonings, and spices that can be used instead of salt to give taste to your food. Herbs should be fresh or dried. Do not choose packaged mixes. Next to the name of the herb, spice, or seasoning are some examples of foods you can pair it with. Herbs  Bay leaves - Soups, meat and vegetable dishes, and spaghetti sauce.  Basil - [employer], soups, pasta, and fish dishes.  Cilantro - Meat, poultry, and vegetable dishes.  Chili powder - Marinades and Mexican dishes.  Chives - Salad dressings and potato dishes.  Cumin - Mexican dishes, couscous, and meat dishes.  Dill - Fish dishes, sauces, and salads.  Fennel - Meat and vegetable dishes, breads, and cookies.  Garlic (do not use garlic salt) - [place] dishes, meat dishes, salad dressings, and sauces.  Marjoram - Soups, potato dishes, and meat dishes.  Oregano - Pizza and spaghetti sauce.  Parsley - Salads, soups, pasta, and meat dishes.  Rosemary - [place] dishes, salad dressings, soups, and red meats.  Saffron - Fish dishes, pasta, and some poultry dishes.  Sage - Stuffings and sauces.  Tarragon - Fish and [employer].  Thyme - Stuffing, meat, and fish dishes. Seasonings  Lemon juice - Fish dishes, poultry dishes, vegetables, and salads.  Vinegar - Salad dressings, vegetables, and fish dishes. Spices  Cinnamon - Sweet dishes, such as cakes, cookies, and puddings.  Cloves - Gingerbread, puddings, and marinades for meats.  Curry -  Vegetable dishes, fish and poultry dishes, and stir-fry dishes.  Ginger - Vegetables dishes, fish dishes, and stir-fry dishes.  Nutmeg - Pasta, vegetables, poultry, fish dishes, and custard. What are some low-sodium ingredients and foods?  Fresh or frozen fruits and vegetables with no sauce added.  Fresh or frozen whole meats, poultry, and fish with no sauce added.  Eggs.  Noodles, pasta, quinoa, rice.  Shredded or puffed wheat or puffed rice.  Regular or quick oats.  Milk, yogurt, hard cheeses, and low-sodium cheeses. Good cheese choices include Swiss, Brethren. Always check the label for the serving size and sodium content.  Unsalted butter or margarine.  Unsalted nuts.  Sherbet or ice cream (keep to  cup per serving).  Homemade pudding.  Sodium-free baking soda and baking powder. This is not a complete list of low-sodium ingredients and foods. Contact your dietitian for more options. Summary  Cooking with less salt is one way to reduce the amount of sodium that you get from food.  Buy sodium-free or low-sodium products.  Check the food label before using or buying packaged ingredients.  Use herbs, seasonings without salt, and spices as substitutes for salt in foods. This information is not intended to replace advice given to you by your health care provider. Make sure you discuss any questions you have with your health care provider. Document Released: 04/01/2005 Document Revised: 04/09/2016 Document Reviewed: 04/09/2016 Elsevier Interactive Patient Education  2017 [employer].

## 2017-10-21 DIAGNOSIS — Z951 Presence of aortocoronary bypass graft: Secondary | ICD-10-CM | POA: Diagnosis not present

## 2017-10-21 DIAGNOSIS — E785 Hyperlipidemia, unspecified: Secondary | ICD-10-CM | POA: Diagnosis not present

## 2017-10-21 DIAGNOSIS — I10 Essential (primary) hypertension: Secondary | ICD-10-CM | POA: Diagnosis not present

## 2017-10-21 DIAGNOSIS — E1143 Type 2 diabetes mellitus with diabetic autonomic (poly)neuropathy: Secondary | ICD-10-CM | POA: Diagnosis not present

## 2017-10-21 DIAGNOSIS — M549 Dorsalgia, unspecified: Secondary | ICD-10-CM | POA: Diagnosis not present

## 2017-10-21 DIAGNOSIS — F319 Bipolar disorder, unspecified: Secondary | ICD-10-CM | POA: Diagnosis not present

## 2017-10-21 DIAGNOSIS — Z791 Long term (current) use of non-steroidal anti-inflammatories (NSAID): Secondary | ICD-10-CM | POA: Diagnosis not present

## 2017-10-21 DIAGNOSIS — K3184 Gastroparesis: Secondary | ICD-10-CM | POA: Diagnosis not present

## 2017-10-21 DIAGNOSIS — I2510 Atherosclerotic heart disease of native coronary artery without angina pectoris: Secondary | ICD-10-CM | POA: Diagnosis not present

## 2017-10-21 DIAGNOSIS — G8929 Other chronic pain: Secondary | ICD-10-CM | POA: Diagnosis not present

## 2017-10-21 DIAGNOSIS — Z48812 Encounter for surgical aftercare following surgery on the circulatory system: Secondary | ICD-10-CM | POA: Diagnosis not present

## 2017-10-21 DIAGNOSIS — Z7982 Long term (current) use of aspirin: Secondary | ICD-10-CM | POA: Diagnosis not present

## 2017-10-21 DIAGNOSIS — G2581 Restless legs syndrome: Secondary | ICD-10-CM | POA: Diagnosis not present

## 2017-10-21 DIAGNOSIS — E039 Hypothyroidism, unspecified: Secondary | ICD-10-CM | POA: Diagnosis not present

## 2017-10-22 ENCOUNTER — Ambulatory Visit (INDEPENDENT_AMBULATORY_CARE_PROVIDER_SITE_OTHER): Payer: Self-pay | Admitting: Cardiothoracic Surgery

## 2017-10-22 ENCOUNTER — Encounter: Payer: Self-pay | Admitting: Cardiothoracic Surgery

## 2017-10-22 ENCOUNTER — Other Ambulatory Visit: Payer: Self-pay | Admitting: *Deleted

## 2017-10-22 ENCOUNTER — Other Ambulatory Visit: Payer: Self-pay

## 2017-10-22 VITALS — BP 128/58 | HR 84 | Resp 16 | Ht 63.0 in | Wt 258.0 lb

## 2017-10-22 DIAGNOSIS — I25110 Atherosclerotic heart disease of native coronary artery with unstable angina pectoris: Secondary | ICD-10-CM

## 2017-10-22 DIAGNOSIS — S81801D Unspecified open wound, right lower leg, subsequent encounter: Secondary | ICD-10-CM

## 2017-10-22 DIAGNOSIS — S21109A Unspecified open wound of unspecified front wall of thorax without penetration into thoracic cavity, initial encounter: Secondary | ICD-10-CM

## 2017-10-22 DIAGNOSIS — Z951 Presence of aortocoronary bypass graft: Secondary | ICD-10-CM

## 2017-10-22 NOTE — Progress Notes (Signed)
PCP is Sinda Du, MD Referring Provider is Arnoldo Lenis, MD  Chief Complaint  Patient presents with  . Routine Post Op    1 wk f/u of sternal wound    HPI: Patient returns for scheduled office wound check.  Status post most multivessel CABG for non-STEMI.  Obese diabetic with a superficial lower sternal wound infection culture negative now granulating in secondarily with home daily dressing changes.  She has started taking zinc to promote wound healing and continues her doxycycline.  She also has a subcutaneous defect in her leg at the saphenous vein harvest site which is being cleaned with saline and packed with Xeroform gauze daily.  She denies any symptoms of angina fever or symptoms of CHF.  Past Medical History:  Diagnosis Date  . Anemia   . Anxiety   . Bipolar disorder ([place])   . Bulging lumbar disc    L3-4  . Chronic daily headache   . Chronic low back pain 09/20/2014  . COPD (chronic obstructive pulmonary disease) ([place])   . Degenerative arthritis   . Depression   . Diabetes mellitus, type II ([address])   . DM type 2 with diabetic peripheral neuropathy ([place]) 09/20/2014  . Dyslipidemia   . Dyspnea   . Gastroparesis   . GERD (gastroesophageal reflux disease)   . Heart murmur   . History of hiatal hernia   . Hypothyroidism   . IBS (irritable bowel syndrome)   . Memory difficulty 09/20/2014  . Morbid obesity ([place])   . Neuropathy   . Obstructive sleep apnea on CPAP   . Restless legs syndrome (RLS) 09/17/2012  . Stroke (cerebrum) ([place]) 05/16/2017   Left parietal    Past Surgical History:  Procedure Laterality Date  . ABDOMINAL HYSTERECTOMY    . ARTHROSCOPY KNEE W/ DRILLING  06/2011   and Decemer of 2013.  [name] Carpel tunnel  1980's  . CATARACT EXTRACTION W/PHACO Right 03/21/2017   Procedure: CATARACT EXTRACTION PHACO AND INTRAOCULAR LENS PLACEMENT RIGHT EYE;  Surgeon: Baruch Goldmann, MD;  Location: [facility];  Service: Ophthalmology;  Laterality: Right;  CDE: 2.91   .  CATARACT EXTRACTION W/PHACO Left 04/18/2017   Procedure: CATARACT EXTRACTION PHACO AND INTRAOCULAR LENS PLACEMENT LEFT EYE;  Surgeon: Baruch Goldmann, MD;  Location: [facility];  Service: Ophthalmology;  Laterality: Left;  left  . CHOLECYSTECTOMY    . COLONOSCOPY WITH PROPOFOL N/A 10/04/2016   Procedure: COLONOSCOPY WITH PROPOFOL;  Surgeon: Rogene Houston, MD;  [facility];  Service: Endoscopy;  Laterality: N/A;  11:10  . CORONARY ARTERY BYPASS GRAFT N/A 08/29/2017   Procedure: CORONARY ARTERY BYPASS GRAFTING (CABG) x 3 WITH ENDOSCOPIC HARVESTING OF RIGHT SAPHENOUS VEIN;  Surgeon: Ivin Poot, MD;  Location: [facility];  Service: Open Heart Surgery;  Laterality: N/A;  . ESOPHAGOGASTRODUODENOSCOPY N/A 04/25/2016   Procedure: ESOPHAGOGASTRODUODENOSCOPY (EGD);  Surgeon: Rogene Houston, MD;  [facility];  Service: Endoscopy;  Laterality: N/A;  730  . LEFT HEART CATH AND CORONARY ANGIOGRAPHY N/A 08/27/2017   Procedure: LEFT HEART CATH AND CORONARY ANGIOGRAPHY;  Surgeon: Sherren Mocha, MD;  Location: [facility];  Service: Cardiovascular::  pLAD 95% - p-mLAD 50%, ostD1 90% -pD1 80%; ostOM1 90%; rPDA 80%. EF ~50-55% - HK of dital Anterolateral & Apical wall.  - Rec CVTS c/s  . RECTAL SURGERY     fissure  . SHOULDER SURGERY Left    arthroscopy in March of this year  . TEE WITHOUT CARDIOVERSION N/A  08/29/2017   Procedure: TRANSESOPHAGEAL ECHOCARDIOGRAM (TEE);  Surgeon: Prescott Gum, Collier Salina, MD;  Location: [facility];  Service: Open Heart Surgery;  Laterality: N/A;  . TRANSTHORACIC ECHOCARDIOGRAM  06/06/2017   Mild to moderate reduced EF 40 and 45%.  Anterior septal, inferoseptal and basal to mid inferior hypokinesis.  GR 1 DD.  No significant valvular lesion    Family History  Problem Relation Age of Onset  . Hypertension Mother   . Lymphoma Mother   . Depression Mother   . Arthritis Father   . Alcohol abuse Father   . Hypertension Sister   . Cancer Brother        kidney and lung   . Alcohol abuse Brother   . Alcohol abuse Paternal Uncle   . Alcohol abuse Paternal Grandfather   . Alcohol abuse Paternal Grandmother     Social History Social History   Tobacco Use  . Smoking status: Former Smoker    Types: Cigarettes    Last attempt to quit: 02/04/1971    Years since quitting: 46.7  . Smokeless tobacco: Never Used  . Tobacco comment: smoked 2 cigarettes a day  Substance Use Topics  . Alcohol use: No    Alcohol/week: 0.0 oz  . Drug use: No    Current Outpatient Medications  Medication Sig Dispense Refill  . acetaminophen (TYLENOL) 650 MG CR tablet Take 650 mg by mouth every 6 (six) hours as needed for pain.    [name] albuterol (PROVENTIL HFA;VENTOLIN HFA) 108 (90 Base) MCG/ACT inhaler Inhale 2 puffs into the lungs every 6 (six) hours as needed for wheezing or shortness of breath.    [name] aspirin EC 325 MG EC tablet Take 1 tablet (325 mg total) by mouth daily. 30 tablet 0  . baclofen (LIORESAL) 10 MG tablet TAKE ONE TABLET BY MOUTH AT BEDTIME. 30 tablet 1  . buPROPion (WELLBUTRIN XL) 300 MG 24 hr tablet TAKE ONE TABLET BY MOUTH EVERY MORNING. 30 tablet 2  . calcium carbonate (TUMS - DOSED IN MG ELEMENTAL CALCIUM) 500 MG chewable tablet Chew 1 tablet by mouth as needed for indigestion or heartburn.    . carvedilol (COREG) 12.5 MG tablet Take 12.5 mg by mouth 2 (two) times daily with a meal.    . doxycycline (VIBRA-TABS) 100 MG tablet Take 1 tablet (100 mg total) by mouth 2 (two) times daily. 14 tablet 0  . EPINEPHrine (EPIPEN 2-PAK) 0.3 mg/0.3 mL IJ SOAJ injection Inject 0.3 mg into the muscle once.    [name] esomeprazole (NEXIUM) 40 MG capsule Take 40 mg by mouth 2 (two) times daily as needed.   12  . ferrous sulfate 325 (65 FE) MG tablet Take 325 mg by mouth 2 (two) times daily with a meal.    . furosemide (LASIX) 40 MG tablet Take 40 mg by mouth daily.     [name] gabapentin (NEURONTIN) 300 MG capsule Take 1 capsule (300 mg total) by mouth 2 (two) times daily.    . hydrOXYzine  (ATARAX/VISTARIL) 10 MG tablet Take 1 tablet (10 mg total) by mouth 3 (three) times daily as needed for itching. 30 tablet 0  . Insulin Glargine (TOUJEO SOLOSTAR) 300 UNIT/ML SOPN Inject 40 Units into the skin at bedtime. 6 pen 2  . lamoTRIgine (LAMICTAL) 150 MG tablet TAKE ONE TABLET BY MOUTH AT BEDTIME. 30 tablet 2  . levothyroxine (SYNTHROID, LEVOTHROID) 100 MCG tablet Take 100 mcg by mouth daily before breakfast.     . losartan (COZAAR) 25 MG tablet  Take 1 tablet (25 mg total) by mouth daily. 90 tablet 3  . meclizine (ANTIVERT) 25 MG tablet Take 1 tablet (25 mg total) by mouth 3 (three) times daily as needed for dizziness. (Patient taking differently: Take 25 mg by mouth daily as needed for dizziness. ) 30 tablet 0  . metFORMIN (GLUCOPHAGE) 1000 MG tablet Take 1,000 mg by mouth 2 (two) times daily with a meal.    . Polyvinyl Alcohol-Povidone (REFRESH OP) Place 2 drops into both eyes daily as needed (for dry eyes).    . potassium chloride SA (K-DUR,KLOR-CON) 20 MEQ tablet TAKE ONE TABLET BY MOUTH DAILY 30 tablet 3  . rOPINIRole (REQUIP) 2 MG tablet Take 2 mg by mouth 3 (three) times daily. .    . rosuvastatin (CRESTOR) 5 MG tablet Take 5 mg by mouth daily.    . traMADol (ULTRAM) 50 MG tablet Take 100 mg by mouth 4 (four) times daily as needed.     . traZODone (DESYREL) 50 MG tablet Take 50 mg by mouth at bedtime.  5  . venlafaxine XR (EFFEXOR-XR) 150 MG 24 hr capsule TAKE TWO (2) CAPSULES BY MOUTH AT BEDTIME. (Patient taking differently: TAKE 300 MG BY MOUTH AT BEDTIME) 60 capsule 2  . Zinc 100 MG TABS Take 1 tablet by mouth daily.     No current facility-administered medications for this visit.     Allergies  Allergen Reactions  . Amoxicillin Anaphylaxis and Other (See Comments)    Has patient had a PCN reaction causing immediate rash, facial/tongue/throat swelling, SOB or lightheadedness with hypotension: Yes Has patient had a PCN reaction causing severe rash involving mucus membranes or  skin necrosis: Yes Has patient had a PCN reaction that required hospitalization: Yes Has patient had a PCN reaction occurring within the last 10 years: Yes If all of the above answers are "NO", then may proceed with Cephalosporin use.   [name] Hydrocodone Anaphylaxis  . Levaquin [Levofloxacin] Rash  . Depacon [Valproic Acid] Other (See Comments)    Causes falls     Review of Systems patient is still not   Ready to start driving.  She is declined outpatient cardiac rehab. No sternal click or pop sensation.  No drainage from the incisions.  BP (!) 128/58 (BP Location: Left Arm, Patient Position: Sitting, Cuff Size: Large)   Pulse 84   Resp 16   Ht 5\' 3"  (1.6 m)   Wt 258 lb (117 kg)   SpO2 98% Comment: ON RA  BMI 45.70 kg/m  Physical Exam I examined both her chest and leg wounds.  Both are clean and granulating in and out fairly superficial.  Both dressings were replaced personally with 1/2 inch Nu Gauze in the chest incision and 1 inch iodoform in the leg incision.  She will continue packing the wounds daily.  A wound culture was performed of the leg.  Diagnostic Tests: None  Impression: Superficial sternal and leg and incision infections being treated with topical wound care at home.  Continue to monitor on a weekly basis at the office.  Plan: Continue doxycycline 100 mg twice daily.  Follow-up wound cultures from leg wound.   Len Childs, MD [facility] [phone]

## 2017-10-23 ENCOUNTER — Telehealth: Payer: Self-pay

## 2017-10-23 ENCOUNTER — Other Ambulatory Visit: Payer: Self-pay

## 2017-10-23 MED ORDER — DOXYCYCLINE HYCLATE 100 MG PO TABS: 100.0000 mg | ORAL_TABLET | Freq: Two times a day (BID) | ORAL | 0 refills | Status: DC

## 2017-10-23 NOTE — Telephone Encounter (Signed)
Per Dr. Lucianne Lei Trigt's note, patient was to continue taking Doxycycline 100mg  x10 days BID.  Patient was called and made aware that the prescription was placed to [facility] in [place].

## 2017-10-25 LAB — WOUND CULTURE
MICRO NUMBER:: 90819110
SPECIMEN QUALITY:: ADEQUATE

## 2017-10-27 ENCOUNTER — Other Ambulatory Visit: Payer: Self-pay | Admitting: *Deleted

## 2017-10-27 ENCOUNTER — Encounter: Payer: Self-pay | Admitting: *Deleted

## 2017-10-27 DIAGNOSIS — A498 Other bacterial infections of unspecified site: Secondary | ICD-10-CM

## 2017-10-27 DIAGNOSIS — S81801D Unspecified open wound, right lower leg, subsequent encounter: Secondary | ICD-10-CM

## 2017-10-27 MED ORDER — CIPROFLOXACIN HCL 500 MG PO TABS: 500.0000 mg | ORAL_TABLET | Freq: Two times a day (BID) | ORAL | 0 refills | Status: DC

## 2017-10-27 NOTE — Progress Notes (Signed)
Norma [name] 10/22/17 right lower leg wound culture returned Enterobacter cloacae complex. Dr. Prescott Gum ordered a course of Cipro for her and she was notified. She said she had started itching and felt it might be the doxycycline she is presently on. This was d/c'd. These orders were sent to her preferred RX.

## 2017-10-29 ENCOUNTER — Ambulatory Visit (INDEPENDENT_AMBULATORY_CARE_PROVIDER_SITE_OTHER): Payer: Self-pay | Admitting: Cardiothoracic Surgery

## 2017-10-29 ENCOUNTER — Encounter: Payer: Self-pay | Admitting: Cardiothoracic Surgery

## 2017-10-29 ENCOUNTER — Other Ambulatory Visit: Payer: Self-pay

## 2017-10-29 VITALS — BP 119/65 | HR 89 | Resp 16 | Ht 63.0 in | Wt 258.0 lb

## 2017-10-29 DIAGNOSIS — Z951 Presence of aortocoronary bypass graft: Secondary | ICD-10-CM

## 2017-10-29 DIAGNOSIS — S81801D Unspecified open wound, right lower leg, subsequent encounter: Secondary | ICD-10-CM

## 2017-10-29 DIAGNOSIS — S21109A Unspecified open wound of unspecified front wall of thorax without penetration into thoracic cavity, initial encounter: Secondary | ICD-10-CM

## 2017-10-29 NOTE — Progress Notes (Signed)
PCP is Sinda Du, MD Referring Provider is Arnoldo Lenis, MD  Chief Complaint  Patient presents with  . Wound Check    sternal wound, right EVH site wound s/p CABG x3 08/29/17.Marland KitchenMarland KitchenCIPRO began on 10/27/17 for leg wound culture results    HPI: The patient returns for recheck. Since her last visit the leg wound at the vein harvest site has grown out Enterobacter sensitive to Cipro.  She was started on Cipro 500 twice daily 2 days ago.  The wound is being packed with saline wet-to-dry dressings daily.  It appears to be improving with granulation tissue.  No purulence on exam today.  The superficial infection in the sternal incision is granulating in and now admits only the tip of the Q-tip.  She will continue to pack this with a small segment of saline Nu Gauze daily.  Otherwise she denies edema.  She denies shortness of breath.  She denies fever.  She denies chest pain.   Past Medical History:  Diagnosis Date  . Anemia   . Anxiety   . Bipolar disorder ([place])   . Bulging lumbar disc    L3-4  . Chronic daily headache   . Chronic low back pain 09/20/2014  . COPD (chronic obstructive pulmonary disease) ([place])   . Degenerative arthritis   . Depression   . Diabetes mellitus, type II ([place])   . DM type 2 with diabetic peripheral neuropathy ([place]) 09/20/2014  . Dyslipidemia   . Dyspnea   . Gastroparesis   . GERD (gastroesophageal reflux disease)   . Heart murmur   . History of hiatal hernia   . Hypothyroidism   . IBS (irritable bowel syndrome)   . Memory difficulty 09/20/2014  . Morbid obesity ([place])   . Neuropathy   . Obstructive sleep apnea on CPAP   . Restless legs syndrome (RLS) 09/17/2012  . Stroke (cerebrum) ([place]) 05/16/2017   Left parietal    Past Surgical History:  Procedure Laterality Date  . ABDOMINAL HYSTERECTOMY    . ARTHROSCOPY KNEE W/ DRILLING  06/2011   and Decemer of 2013.  [name] Carpel tunnel  1980's  . CATARACT EXTRACTION W/PHACO Right 03/21/2017   Procedure:  CATARACT EXTRACTION PHACO AND INTRAOCULAR LENS PLACEMENT RIGHT EYE;  Surgeon: Baruch Goldmann, MD;  [facility];  Service: Ophthalmology;  Laterality: Right;  CDE: 2.91   . CATARACT EXTRACTION W/PHACO Left 04/18/2017   Procedure: CATARACT EXTRACTION PHACO AND INTRAOCULAR LENS PLACEMENT LEFT EYE;  Surgeon: Baruch Goldmann, MD;  [facility];  Service: Ophthalmology;  Laterality: Left;  left  . CHOLECYSTECTOMY    . COLONOSCOPY WITH PROPOFOL N/A 10/04/2016   Procedure: COLONOSCOPY WITH PROPOFOL;  Surgeon: Rogene Houston, MD;  [facility];  Service: Endoscopy;  Laterality: N/A;  11:10  . CORONARY ARTERY BYPASS GRAFT N/A 08/29/2017   Procedure: CORONARY ARTERY BYPASS GRAFTING (CABG) x 3 WITH ENDOSCOPIC HARVESTING OF RIGHT SAPHENOUS VEIN;  Surgeon: Ivin Poot, MD;  Location: [facility];  Service: Open Heart Surgery;  Laterality: N/A;  . ESOPHAGOGASTRODUODENOSCOPY N/A 04/25/2016   Procedure: ESOPHAGOGASTRODUODENOSCOPY (EGD);  Surgeon: Rogene Houston, MD;  [facility];  Service: Endoscopy;  Laterality: N/A;  730  . LEFT HEART CATH AND CORONARY ANGIOGRAPHY N/A 08/27/2017   Procedure: LEFT HEART CATH AND CORONARY ANGIOGRAPHY;  Surgeon: Sherren Mocha, MD;  Location: [facility];  Service: Cardiovascular::  pLAD 95% - p-mLAD 50%, ostD1 90% -pD1 80%; ostOM1 90%; rPDA 80%. EF ~50-55% - HK  of dital Anterolateral & Apical wall.  - Rec CVTS c/s  . RECTAL SURGERY     fissure  . SHOULDER SURGERY Left    arthroscopy in March of this year  . TEE WITHOUT CARDIOVERSION N/A 08/29/2017   Procedure: TRANSESOPHAGEAL ECHOCARDIOGRAM (TEE);  Surgeon: Prescott Gum, Collier Salina, MD;  Location: [facility];  Service: Open Heart Surgery;  Laterality: N/A;  . TRANSTHORACIC ECHOCARDIOGRAM  06/06/2017   Mild to moderate reduced EF 40 and 45%.  Anterior septal, inferoseptal and basal to mid inferior hypokinesis.  GR 1 DD.  No significant valvular lesion    Family History  Problem Relation Age of Onset  .  Hypertension Mother   . Lymphoma Mother   . Depression Mother   . Arthritis Father   . Alcohol abuse Father   . Hypertension Sister   . Cancer Brother        kidney and lung  . Alcohol abuse Brother   . Alcohol abuse Paternal Uncle   . Alcohol abuse Paternal Grandfather   . Alcohol abuse Paternal Grandmother     Social History Social History   Tobacco Use  . Smoking status: Former Smoker    Types: Cigarettes    Last attempt to quit: 02/04/1971    Years since quitting: 46.7  . Smokeless tobacco: Never Used  . Tobacco comment: smoked 2 cigarettes a day  Substance Use Topics  . Alcohol use: No    Alcohol/week: 0.0 oz  . Drug use: No    Current Outpatient Medications  Medication Sig Dispense Refill  . acetaminophen (TYLENOL) 650 MG CR tablet Take 650 mg by mouth every 6 (six) hours as needed for pain.    [name] albuterol (PROVENTIL HFA;VENTOLIN HFA) 108 (90 Base) MCG/ACT inhaler Inhale 2 puffs into the lungs every 6 (six) hours as needed for wheezing or shortness of breath.    [name] aspirin EC 325 MG EC tablet Take 1 tablet (325 mg total) by mouth daily. 30 tablet 0  . baclofen (LIORESAL) 10 MG tablet TAKE ONE TABLET BY MOUTH AT BEDTIME. 30 tablet 1  . buPROPion (WELLBUTRIN XL) 300 MG 24 hr tablet TAKE ONE TABLET BY MOUTH EVERY MORNING. 30 tablet 2  . calcium carbonate (TUMS - DOSED IN MG ELEMENTAL CALCIUM) 500 MG chewable tablet Chew 1 tablet by mouth as needed for indigestion or heartburn.    . carvedilol (COREG) 3.125 MG tablet Take 3.125 mg by mouth 2 (two) times daily with a meal.    . ciprofloxacin (CIPRO) 500 MG tablet Take 1 tablet (500 mg total) by mouth 2 (two) times daily. 20 tablet 0  . EPINEPHrine (EPIPEN 2-PAK) 0.3 mg/0.3 mL IJ SOAJ injection Inject 0.3 mg into the muscle once.    [name] esomeprazole (NEXIUM) 40 MG capsule Take 40 mg by mouth 2 (two) times daily as needed.   12  . ferrous sulfate 325 (65 FE) MG tablet Take 325 mg by mouth 2 (two) times daily with a meal.    .  furosemide (LASIX) 40 MG tablet Take 40 mg by mouth daily.     [name] gabapentin (NEURONTIN) 300 MG capsule Take 1 capsule (300 mg total) by mouth 2 (two) times daily.    . hydrOXYzine (ATARAX/VISTARIL) 10 MG tablet Take 1 tablet (10 mg total) by mouth 3 (three) times daily as needed for itching. 30 tablet 0  . Insulin Glargine (TOUJEO SOLOSTAR) 300 UNIT/ML SOPN Inject 40 Units into the skin at bedtime. 6 pen 2  . lamoTRIgine (  LAMICTAL) 150 MG tablet TAKE ONE TABLET BY MOUTH AT BEDTIME. 30 tablet 2  . levothyroxine (SYNTHROID, LEVOTHROID) 100 MCG tablet Take 100 mcg by mouth daily before breakfast.     . losartan (COZAAR) 25 MG tablet Take 1 tablet (25 mg total) by mouth daily. 90 tablet 3  . meclizine (ANTIVERT) 25 MG tablet Take 1 tablet (25 mg total) by mouth 3 (three) times daily as needed for dizziness. (Patient taking differently: Take 25 mg by mouth daily as needed for dizziness. ) 30 tablet 0  . metFORMIN (GLUCOPHAGE) 1000 MG tablet Take 1,000 mg by mouth 2 (two) times daily with a meal.    . Multiple Vitamins-Minerals (ZINC PO) Take 200 mg by mouth daily.    . Polyvinyl Alcohol-Povidone (REFRESH OP) Place 2 drops into both eyes daily as needed (for dry eyes).    . potassium chloride SA (K-DUR,KLOR-CON) 20 MEQ tablet TAKE ONE TABLET BY MOUTH DAILY 30 tablet 3  . rOPINIRole (REQUIP) 2 MG tablet Take 2 mg by mouth 3 (three) times daily. .    . rosuvastatin (CRESTOR) 5 MG tablet Take 5 mg by mouth daily.    . traMADol (ULTRAM) 50 MG tablet Take 100 mg by mouth 4 (four) times daily as needed.     . traZODone (DESYREL) 50 MG tablet Take 50 mg by mouth at bedtime.  5  . venlafaxine XR (EFFEXOR-XR) 150 MG 24 hr capsule TAKE TWO (2) CAPSULES BY MOUTH AT BEDTIME. (Patient taking differently: TAKE 300 MG BY MOUTH AT BEDTIME) 60 capsule 2   No current facility-administered medications for this visit.     Allergies  Allergen Reactions  . Amoxicillin Anaphylaxis and Other (See Comments)    Has patient  had a PCN reaction causing immediate rash, facial/tongue/throat swelling, SOB or lightheadedness with hypotension: Yes Has patient had a PCN reaction causing severe rash involving mucus membranes or skin necrosis: Yes Has patient had a PCN reaction that required hospitalization: Yes Has patient had a PCN reaction occurring within the last 10 years: Yes If all of the above answers are "NO", then may proceed with Cephalosporin use.   [name] Hydrocodone Anaphylaxis  . Levaquin [Levofloxacin] Rash  . Depacon [Valproic Acid] Other (See Comments)    Causes falls     Review of Systems  Weight stable  Strength improving Ambulating and walking better  BP 119/65 (BP Location: Left Arm, Patient Position: Sitting, Cuff Size: Large)   Pulse 89   Resp 16   Ht 5\' 3"  (1.6 m)   Wt 258 lb (117 kg)   SpO2 97% Comment: RA  BMI 45.70 kg/m  Physical Exam Lungs clear Heart rate regular Sternum stable Both sternal and leg wounds are granulating.  Sternal wound almost completely healed. She will need probably 2 to 3 weeks of oral Cipro for the leg Enterobacter infection  Diagnostic Tests:  None Impression: Wounds improving as noted above  Plan: Return for recheck in 1 week.  Len Childs, MD [facility] [phone]

## 2017-10-30 ENCOUNTER — Other Ambulatory Visit: Payer: Self-pay | Admitting: Neurology

## 2017-11-05 ENCOUNTER — Ambulatory Visit: Payer: Self-pay | Admitting: Cardiothoracic Surgery

## 2017-11-05 NOTE — Progress Notes (Signed)
Cardiology Office Note    Date:  11/10/2017   ID:  Norma [name], DOB 04/05/1951, MRN [id number]  PCP:  Sinda Du, MD  Cardiologist: Carlyle Dolly, MD  Chief Complaint  Patient presents with  . Follow-up    History of Present Illness:  Norma [name] is a 67 y.o. female with history of CAD found to have three-vessel disease on cath in 08/2017 during admission for unstable angina.  She underwent CABG times 3-5/18/19 with SVG to the diagonal and LAD, SVG to OM.  She had postop atrial fibrillation treated with amiodarone, superficial sternal wound infection on antibiotics and followed closely by Dr. Darcey Nora.  2D echo 05/2017 LVEF 40 to 45%.  Saw Dr. Harl Bowie 09/24/2017 at which time amiodarone was reduced to 200 mg daily with plans to stop at next office visit.  She was also having significant fatigue felt secondary to the beta-blocker and Lopressor was stopped.   I saw the patient 10/20/2017 complaining of significant fatigue shortness of breath and leg swelling.  She was told to take her Lasix every other day but had a frozen dinner and had gained 8 pounds but says she lost 2 overnight.  She is to be on Cozaar but it was stopped when she was put on metoprolol.  Heart rate was a little fast but she was in normal sinus rhythm.  I stopped her amiodarone and start Coreg 3.125 mg twice daily for blood pressure and heart rate control.  Recommended cardiac rehab.  Patient comes in today for follow-up.  Blood pressure is high.  She cannot remember whether she took her medicines today or not.  She says she is watching her salt closer and her swelling is down but her weight is up.  She can do cardiac rehab until all her wounds are healed.  She does get short of breath with walking longer distances but overall thinks she is doing better.    Past Medical History:  Diagnosis Date  . Anemia   . Anxiety   . Bipolar disorder ([place])   . Bulging lumbar disc    L3-4  . Chronic daily headache   .  Chronic low back pain 09/20/2014  . COPD (chronic obstructive pulmonary disease) ([place])   . Degenerative arthritis   . Depression   . Diabetes mellitus, type II ([place])   . DM type 2 with diabetic peripheral neuropathy ([place]) 09/20/2014  . Dyslipidemia   . Dyspnea   . Gastroparesis   . GERD (gastroesophageal reflux disease)   . Heart murmur   . History of hiatal hernia   . Hypothyroidism   . IBS (irritable bowel syndrome)   . Memory difficulty 09/20/2014  . Morbid obesity ([place])   . Neuropathy   . Obstructive sleep apnea on CPAP   . Restless legs syndrome (RLS) 09/17/2012  . Stroke (cerebrum) ([place]) 05/16/2017   Left parietal    Past Surgical History:  Procedure Laterality Date  . ABDOMINAL HYSTERECTOMY    . ARTHROSCOPY KNEE W/ DRILLING  06/2011   and Dece[place] of 2013.  [name] Carpel tunnel  1980's  . CATARACT EXTRACTION W/PHACO Right 03/21/2017   Procedure: CATARACT EXTRACTION PHACO AND INTRAOCULAR LENS PLACEMENT RIGHT EYE;  Surgeon: Baruch Goldmann, MD;  Location: [facility];  Service: Ophthalmology;  Laterality: Right;  CDE: 2.91   . CATARACT EXTRACTION W/PHACO Left 04/18/2017   Procedure: CATARACT EXTRACTION PHACO AND INTRAOCULAR LENS PLACEMENT LEFT EYE;  Surgeon: Baruch Goldmann, MD;  Location: [facility];  Service:  Ophthalmology;  Laterality: Left;  left  . CHOLECYSTECTOMY    . COLONOSCOPY WITH PROPOFOL N/A 10/04/2016   Procedure: COLONOSCOPY WITH PROPOFOL;  Surgeon: Rogene Houston, MD;  Location: [facility];  Service: Endoscopy;  Laterality: N/A;  11:10  . CORONARY ARTERY BYPASS GRAFT N/A 08/29/2017   Procedure: CORONARY ARTERY BYPASS GRAFTING (CABG) x 3 WITH ENDOSCOPIC HARVESTING OF RIGHT SAPHENOUS VEIN;  Surgeon: Ivin Poot, MD;  Location: [facility];  Service: Open Heart Surgery;  Laterality: N/A;  . ESOPHAGOGASTRODUODENOSCOPY N/A 04/25/2016   Procedure: ESOPHAGOGASTRODUODENOSCOPY (EGD);  Surgeon: Rogene Houston, MD;  [facility];  Service: Endoscopy;  Laterality: N/A;  730  . LEFT  HEART CATH AND CORONARY ANGIOGRAPHY N/A 08/27/2017   Procedure: LEFT HEART CATH AND CORONARY ANGIOGRAPHY;  Surgeon: Sherren Mocha, MD;  Location: [facility];  Service: Cardiovascular::  pLAD 95% - p-mLAD 50%, ostD1 90% -pD1 80%; ostOM1 90%; rPDA 80%. EF ~50-55% - HK of dital Anterolateral & Apical wall.  - Rec CVTS c/s  . RECTAL SURGERY     fissure  . SHOULDER SURGERY Left    arthroscopy in March of this year  . TEE WITHOUT CARDIOVERSION N/A 08/29/2017   Procedure: TRANSESOPHAGEAL ECHOCARDIOGRAM (TEE);  Surgeon: Prescott Gum, Collier Salina, MD;  Location: [facility];  Service: Open Heart Surgery;  Laterality: N/A;  . TRANSTHORACIC ECHOCARDIOGRAM  06/06/2017   Mild to moderate reduced EF 40 and 45%.  Anterior septal, inferoseptal and basal to mid inferior hypokinesis.  GR 1 DD.  No significant valvular lesion    Current Medications: Current Meds  Medication Sig  . acetaminophen (TYLENOL) 650 MG CR tablet Take 650 mg by mouth every 6 (six) hours as needed for pain.  [name] albuterol (PROVENTIL HFA;VENTOLIN HFA) 108 (90 Base) MCG/ACT inhaler Inhale 2 puffs into the lungs every 6 (six) hours as needed for wheezing or shortness of breath.  [name] aspirin EC 325 MG EC tablet Take 1 tablet (325 mg total) by mouth daily.  . baclofen (LIORESAL) 10 MG tablet TAKE ONE TABLET BY MOUTH AT BEDTIME.  [name] buPROPion (WELLBUTRIN XL) 300 MG 24 hr tablet TAKE ONE TABLET BY MOUTH EVERY MORNING.  . calcium carbonate (TUMS - DOSED IN MG ELEMENTAL CALCIUM) 500 MG chewable tablet Chew 1 tablet by mouth as needed for indigestion or heartburn.  [name] EPINEPHrine (EPIPEN 2-PAK) 0.3 mg/0.3 mL IJ SOAJ injection Inject 0.3 mg into the muscle once.  [name] esomeprazole (NEXIUM) 40 MG capsule Take 40 mg by mouth 2 (two) times daily as needed.   . ferrous sulfate 325 (65 FE) MG tablet Take 325 mg by mouth 2 (two) times daily with a meal.  . gabapentin (NEURONTIN) 300 MG capsule Take 1 capsule (300 mg total) by mouth 2 (two) times daily.  . hydrOXYzine  (ATARAX/VISTARIL) 10 MG tablet Take 1 tablet (10 mg total) by mouth 3 (three) times daily as needed for itching.  . Insulin Glargine (TOUJEO SOLOSTAR) 300 UNIT/ML SOPN Inject 40 Units into the skin at bedtime.  . lamoTRIgine (LAMICTAL) 150 MG tablet TAKE ONE TABLET BY MOUTH AT BEDTIME.  [name] levothyroxine (SYNTHROID, LEVOTHROID) 100 MCG tablet Take 100 mcg by mouth daily before breakfast.   . losartan (COZAAR) 25 MG tablet Take 1 tablet (25 mg total) by mouth daily.  . meclizine (ANTIVERT) 25 MG tablet Take 1 tablet (25 mg total) by mouth 3 (three) times daily as needed for dizziness. (Patient taking differently: Take 25 mg by mouth daily as needed  for dizziness. )  . metFORMIN (GLUCOPHAGE) 1000 MG tablet Take 1,000 mg by mouth 2 (two) times daily with a meal.  . Multiple Vitamins-Minerals (ZINC PO) Take 200 mg by mouth daily.  . Polyvinyl Alcohol-Povidone (REFRESH OP) Place 2 drops into both eyes daily as needed (for dry eyes).  . potassium chloride SA (K-DUR,KLOR-CON) 20 MEQ tablet TAKE ONE TABLET BY MOUTH DAILY  . rOPINIRole (REQUIP) 2 MG tablet Take 2 mg by mouth 3 (three) times daily. .  . rosuvastatin (CRESTOR) 5 MG tablet Take 5 mg by mouth daily.  [name] torsemide (DEMADEX) 20 MG tablet Take 20 mg by mouth daily.  . traMADol (ULTRAM) 50 MG tablet Take 100 mg by mouth 4 (four) times daily as needed.   . traZODone (DESYREL) 50 MG tablet Take 50 mg by mouth at bedtime.  [name] venlafaxine XR (EFFEXOR-XR) 150 MG 24 hr capsule TAKE TWO (2) CAPSULES BY MOUTH AT BEDTIME. (Patient taking differently: TAKE 300 MG BY MOUTH AT BEDTIME)  . [DISCONTINUED] carvedilol (COREG) 3.125 MG tablet Take 3.125 mg by mouth 2 (two) times daily with a meal.     Allergies:   Amoxicillin; Hydrocodone; Levaquin [levofloxacin]; and Depacon [valproic acid]   Social History   Socioeconomic History  . Marital status: Single    Spouse name: Not on file  . Number of children: 0  . Years of education: 53  . Highest education  level: Not on file  Occupational History    Employer: [employer]  . Financial resource strain: Not on file  . Food insecurity:    Worry: Not on file    Inability: Not on file  . Transportation needs:    Medical: Not on file    Non-medical: Not on file  Tobacco Use  . Smoking status: Former Smoker    Types: Cigarettes    Last attempt to quit: 02/04/1971    Years since quitting: 46.7  . Smokeless tobacco: Never Used  . Tobacco comment: smoked 2 cigarettes a day  Substance and Sexual Activity  . Alcohol use: No    Alcohol/week: 0.0 oz  . Drug use: No  . Sexual activity: Never  Lifestyle  . Physical activity:    Days per week: Not on file    Minutes per session: Not on file  . Stress: Not on file  Relationships  . Social connections:    Talks on phone: Not on file    Gets together: Not on file    Attends religious service: Not on file    Active member of club or organization: Not on file    Attends meetings of clubs or organizations: Not on file    Relationship status: Not on file  Other Topics Concern  . Not on file  Social History Narrative   Patient lives at home alone.    Patient has no children.    Patient has her masters in nursing.    Patient is single.    Patient drinks about 2 glasses of tea daily.   Patient is right handed.     Family History:  The patient's family history includes Alcohol abuse in her brother, father, paternal grandfather, paternal grandmother, and paternal uncle; Arthritis in her father; Cancer in her brother; Depression in her mother; Hypertension in her mother and sister; Lymphoma in her mother.   ROS:   Please see the history of present illness.    Review of Systems  Constitution: Positive for weight gain.  HENT: Negative.   Eyes: Negative.   Cardiovascular: Positive for dyspnea on exertion.  Respiratory: Negative.   Hematologic/Lymphatic: Negative.   Skin: Positive for poor wound healing.    Musculoskeletal: Negative.  Negative for joint pain.  Gastrointestinal: Negative.   Genitourinary: Negative.   Neurological: Positive for weakness.   All other systems reviewed and are negative.   PHYSICAL EXAM:   VS:  BP (!) 168/84   Pulse 96   Ht 5\' 3"  (1.6 m)   Wt 260 lb 3.2 oz (118 kg)   SpO2 98% Comment: on room air  BMI 46.09 kg/m   Physical Exam  GEN: Obese, in no acute distress  Neck: no JVD, carotid bruits, or masses Cardiac: Incision on lower chest is bandaged RRR; 2/6 systolic murmur at the left sternal border Respiratory:  clear to auscultation bilaterally, normal work of breathing GI: soft, nontender, nondistended, + BS Ext: without cyanosis, clubbing, or edema, Good distal pulses bilaterally Neuro:  Alert and Oriented x 3 Psych: euthymic mood, full affect  Wt Readings from Last 3 Encounters:  11/10/17 260 lb 3.2 oz (118 kg)  10/29/17 258 lb (117 kg)  10/22/17 258 lb (117 kg)      Studies/Labs Reviewed:   EKG:  EKG is not ordered today.    Recent Labs: 07/11/2017: TSH 3.71 08/30/2017: Magnesium 2.0 09/05/2017: ALT 14; BUN 11; Creatinine, Ser 0.87; Hemoglobin 8.8; Platelets 190; Potassium 4.9; Sodium 139   Lipid Panel    Component Value Date/Time   CHOL 161 07/10/2016 0934   TRIG 153 (H) 07/10/2016 0934   HDL 47 (L) 07/10/2016 0934   CHOLHDL 3.4 07/10/2016 0934   VLDL 31 (H) 07/10/2016 0934   LDLCALC 83 07/10/2016 0934    Additional studies/ records that were reviewed today include:  03/2016 Lexiscan  Probable normal perfusion and mild soft tissue attenuation (breast) No significant ischemia or evidence for scar  The left ventricular ejection fraction is normal (55-65%).  Low risk scan.   08/2017 cath  RPDA lesion is 80% stenosed.  Ost 1st Mrg to 1st Mrg lesion is 90% stenosed.  Prox LAD lesion is 95% stenosed.  Prox LAD to Mid LAD lesion is 50% stenosed.  Ost 1st Diag lesion is 90% stenosed.  1st Diag lesion is 80% stenosed.  The  left ventricular ejection fraction is 50-55% by visual estimate.  There is mild left ventricular systolic dysfunction.   1.  Severe multivessel coronary artery disease with severe stenosis of the proximal LAD/first diagonal bifurcation, first obtuse marginal branch of the circumflex, and PDA branch of the RCA. 2.  Mild segmental LV systolic dysfunction with hypokinesis of the distal anterolateral and apical walls, LVEF estimated at 50 to 55%.   08/2017 TEE  Aortic valve: The valve is trileaflet. No stenosis. No regurgitation.  Mitral valve: Mild regurgitation. Central jet.  Right ventricle: Normal cavity size, wall thickness and ejection fraction.  Pericardium: Trivial pericardial effusion.  Tricuspid valve: No regurgitation  Pulmonic valve: No regurgitation by color doppler.  Left ventricle: Regional wall motion abnormalities present, dyskinetic basal and apical septal wall, hypokinesis of lateral wall and inferoseptal wall. Increased wall thickness. LVEF 35-40%.  No ASD/PFO present  Unable to rule out thrombus in LAA but velocities make thrombus less likely.   05/2017 echo Study Conclusions   - Left ventricle: The cavity size was normal. There was mild   concentric hypertrophy. Systolic function was mildly to   moderately reduced. The estimated ejection fraction was in the  range of 40% to 45%. Hypokinesis of the anteroseptal,   inferoseptal and basal to mid inferior myocardium. Doppler   parameters are consistent with abnormal left ventricular   relaxation (grade 1 diastolic dysfunction). Doppler parameters   are consistent with indeterminate ventricular filling pressure. - Aortic valve: Transvalvular velocity was within the normal range.   There was no stenosis. There was no regurgitation. - Mitral valve: Transvalvular velocity was within the normal range.   There was no evidence for stenosis. There was no regurgitation. - Right ventricle: The cavity size was normal. Wall  thickness was   normal. Systolic function was normal. - Tricuspid valve: There was trivial regurgitation. - Global longitudinal strain -11.9%.           ASSESSMENT:    1. Coronary artery disease involving native coronary artery of native heart without angina pectoris   2. Ischemic cardiomyopathy   3. Essential hypertension, benign   4. Mixed hyperlipidemia   5. Morbid obesity, unspecified obesity type ([place])      PLAN:  In order of problems listed above:  CAD status post CABG times three 06/1515 complicated by superficial sternal wound infection followed closely by Dr. Darcey Nora.  Postop atrial fibrillation was treated with amiodarone but I stopped at last office visit she was in his normal sinus rhythm. Coreg 3.125 mg twice daily was started for blood pressure and heart rate control.  Will increase to 6.25 mg twice daily today for better blood pressure control.  Ischemic cardiomyopathy ejection fraction 40 to 45% on echo 06/06/2017 Intra-Op TEE LV greater than 40% with vasopressor support.  Plan is to check 2D echo when she hass been on carvedilol and an ARB for a couple months.  Increase carvedilol to 6.25 mg twice daily.  Continue losartan 25 mg once daily.  Follow-up with Dr. Harl Bowie in 2 to 3 months.  Essential hypertension blood pressure elevated today.  She does not know whether she took her medications or not.  Will increase carvedilol to 6.25 mg twice daily.  Come back for blood pressure check in 2 weeks.  She has a cuff at home and I have asked her to keep track of her blood pressures daily.  Hyperlipidemia continue Crestor.  Morbid obesity weight loss would help her overall health.  Cardiac rehab once wounds are healed.  Medication Adjustments/Labs and Tests Ordered: Current medicines are reviewed at length with the patient today.  Concerns regarding medicines are outlined above.  Medication changes, Labs and Tests ordered today are listed in the Patient Instructions  below. Patient Instructions  Medication Instructions:  Your physician has recommended you make the following change in your medication:  Increase Coreg to 6.25 mg Two Times Daily    Labwork: NONE   Testing/Procedures: NONE   Follow-Up: Your physician recommends that you schedule a follow-up appointment in: 2 Months with Dr. Harl Bowie   Your physician recommends that you schedule a follow-up appointment in: 2 Weeks for a Blood Pressure Check.     Any Other Special Instructions Will Be Listed Below (If Applicable).  Your physician has requested that you regularly monitor and record your blood pressure readings at home. Please use the same machine at the same time of day to check your readings and record them to bring to your follow-up visit.     If you need a refill on your cardiac medications before your next appointment, please call your pharmacy.  Thank you for choosing [facility]!      Signed,  Ermalinda Barrios, PA-C  11/10/2017 1:30 PM    [facility] [address], [place], [place]  [address] Phone: [phone]; Fax: [phone]

## 2017-11-10 ENCOUNTER — Encounter: Payer: Self-pay | Admitting: Physician Assistant

## 2017-11-10 ENCOUNTER — Ambulatory Visit: Payer: PPO | Admitting: Physician Assistant

## 2017-11-10 VITALS — BP 168/84 | HR 96 | Ht 63.0 in | Wt 260.2 lb

## 2017-11-10 DIAGNOSIS — E782 Mixed hyperlipidemia: Secondary | ICD-10-CM | POA: Diagnosis not present

## 2017-11-10 DIAGNOSIS — I10 Essential (primary) hypertension: Secondary | ICD-10-CM | POA: Diagnosis not present

## 2017-11-10 DIAGNOSIS — I2510 Atherosclerotic heart disease of native coronary artery without angina pectoris: Secondary | ICD-10-CM | POA: Diagnosis not present

## 2017-11-10 DIAGNOSIS — I255 Ischemic cardiomyopathy: Secondary | ICD-10-CM

## 2017-11-10 MED ORDER — CARVEDILOL 6.25 MG PO TABS: 6.2500 mg | ORAL_TABLET | Freq: Two times a day (BID) | ORAL | 3 refills | Status: DC

## 2017-11-10 NOTE — Patient Instructions (Signed)
Medication Instructions:  Your physician has recommended you make the following change in your medication:  Increase Coreg to 6.25 mg Two Times Daily    Labwork: NONE   Testing/Procedures: NONE   Follow-Up: Your physician recommends that you schedule a follow-up appointment in: 2 Months with Dr. Harl Bowie   Your physician recommends that you schedule a follow-up appointment in: 2 Weeks for a Blood Pressure Check.     Any Other Special Instructions Will Be Listed Below (If Applicable).  Your physician has requested that you regularly monitor and record your blood pressure readings at home. Please use the same machine at the same time of day to check your readings and record them to bring to your follow-up visit.     If you need a refill on your cardiac medications before your next appointment, please call your pharmacy.  Thank you for choosing [facility]!

## 2017-11-12 ENCOUNTER — Encounter: Payer: Self-pay | Admitting: Cardiothoracic Surgery

## 2017-11-12 ENCOUNTER — Other Ambulatory Visit: Payer: Self-pay

## 2017-11-12 ENCOUNTER — Ambulatory Visit (INDEPENDENT_AMBULATORY_CARE_PROVIDER_SITE_OTHER): Payer: Self-pay | Admitting: Cardiothoracic Surgery

## 2017-11-12 VITALS — BP 136/64 | HR 76 | Resp 16 | Ht 63.0 in | Wt 260.0 lb

## 2017-11-12 DIAGNOSIS — Z951 Presence of aortocoronary bypass graft: Secondary | ICD-10-CM

## 2017-11-12 DIAGNOSIS — S81801D Unspecified open wound, right lower leg, subsequent encounter: Secondary | ICD-10-CM

## 2017-11-12 DIAGNOSIS — S21109A Unspecified open wound of unspecified front wall of thorax without penetration into thoracic cavity, initial encounter: Secondary | ICD-10-CM

## 2017-11-12 NOTE — Progress Notes (Signed)
PCP is Sinda Du, MD Referring Provider is Arnoldo Lenis, MD  Chief Complaint  Patient presents with  . Routine Post Op    wound check...sternal/right leg EVH incision    HPI: Patient returns for postop wound check.  She had a CABG x3         May 2019.  She developed a Enterobacter superficial leg infection at the site of her right endoscopic vein harvest site.  This was treated with Cipro for 10 days to which the organism was sensitive.  This wound is significantly healed and is now with complete granulation tissue and cellulitis has resolved.  She also has a small subcutaneous defect at the lower sternal incision probably related to her pendulous breasts and tension.  It has good granulation tissue.  This is too small to pack and is treated with topical agent-Neosporin.  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 Past Medical History:  Diagnosis Date  . Anemia   . Anxiety   . Bipolar disorder ([place])   . Bulging lumbar disc    L3-4  . Chronic daily headache   . Chronic low back pain 09/20/2014  . COPD (chronic obstructive pulmonary disease) ([place])   . Degenerative arthritis   . Depression   . Diabetes mellitus, type II ([place])   . DM type 2 with diabetic peripheral neuropathy ([place]) 09/20/2014  . Dyslipidemia   . Dyspnea   . Gastroparesis   . GERD (gastroesophageal reflux disease)   . Heart murmur   . History of hiatal hernia   . Hypothyroidism   . IBS (irritable bowel syndrome)   . Memory difficulty 09/20/2014  . Morbid obesity ([place])   . Neuropathy   . Obstructive sleep apnea on CPAP   . Restless legs syndrome (RLS) 09/17/2012  . Stroke (cerebrum) ([place]) 05/16/2017   Left parietal    Past Surgical History:  Procedure Laterality Date  . ABDOMINAL HYSTERECTOMY    . ARTHROSCOPY KNEE W/ DRILLING  06/2011   and Decemer of 2013.  [name] Carpel tunnel  1980's  . CATARACT EXTRACTION W/PHACO Right 03/21/2017   Procedure: CATARACT EXTRACTION PHACO AND  INTRAOCULAR LENS PLACEMENT RIGHT EYE;  Surgeon: Baruch Goldmann, MD;  Location: [facility];  Service: Ophthalmology;  Laterality: Right;  CDE: 2.91   . CATARACT EXTRACTION W/PHACO Left 04/18/2017   Procedure: CATARACT EXTRACTION PHACO AND INTRAOCULAR LENS PLACEMENT LEFT EYE;  Surgeon: Baruch Goldmann, MD;  Location: [facility];  Service: Ophthalmology;  Laterality: Left;  left  . CHOLECYSTECTOMY    . COLONOSCOPY WITH PROPOFOL N/A 10/04/2016   Procedure: COLONOSCOPY WITH PROPOFOL;  Surgeon: Rogene Houston, MD;  [facility];  Service: Endoscopy;  Laterality: N/A;  11:10  . CORONARY ARTERY BYPASS GRAFT N/A 08/29/2017   Procedure: CORONARY ARTERY BYPASS GRAFTING (CABG) x 3 WITH ENDOSCOPIC HARVESTING OF RIGHT SAPHENOUS VEIN;  Surgeon: Ivin Poot, MD;  Location: [place];  Service: Open Heart Surgery;  Laterality: N/A;  . ESOPHAGOGASTRODUODENOSCOPY N/A 04/25/2016   Procedure: ESOPHAGOGASTRODUODENOSCOPY (EGD);  Surgeon: Rogene Houston, MD;  [facility];  Service: Endoscopy;  Laterality: N/A;  730  . LEFT HEART CATH AND CORONARY ANGIOGRAPHY N/A 08/27/2017   Procedure: LEFT HEART CATH AND CORONARY ANGIOGRAPHY;  Surgeon: Sherren Mocha, MD;  Location: [place] [facility];  Service: Cardiovascular::  pLAD 95% - p-mLAD 50%, ostD1 90% -pD1 80%; ostOM1 90%; rPDA 80%. EF ~50-55% - HK of dital Anterolateral & Apical wall.  -  Rec CVTS c/s  . RECTAL SURGERY     fissure  . SHOULDER SURGERY Left    arthroscopy in March of this year  . TEE WITHOUT CARDIOVERSION N/A 08/29/2017   Procedure: TRANSESOPHAGEAL ECHOCARDIOGRAM (TEE);  Surgeon: Prescott Gum, Collier Salina, MD;  Location: [place];  Service: Open Heart Surgery;  Laterality: N/A;  . TRANSTHORACIC ECHOCARDIOGRAM  06/06/2017   Mild to moderate reduced EF 40 and 45%.  Anterior septal, inferoseptal and basal to mid inferior hypokinesis.  GR 1 DD.  No significant valvular lesion    Family History  Problem Relation Age of Onset  . Hypertension Mother   .  Lymphoma Mother   . Depression Mother   . Arthritis Father   . Alcohol abuse Father   . Hypertension Sister   . Cancer Brother        kidney and lung  . Alcohol abuse Brother   . Alcohol abuse Paternal Uncle   . Alcohol abuse Paternal Grandfather   . Alcohol abuse Paternal Grandmother     Social History Social History   Tobacco Use  . Smoking status: Former Smoker    Types: Cigarettes    Last attempt to quit: 02/04/1971    Years since quitting: 46.8  . Smokeless tobacco: Never Used  . Tobacco comment: smoked 2 cigarettes a day  Substance Use Topics  . Alcohol use: No    Alcohol/week: 0.0 oz  . Drug use: No    Current Outpatient Medications  Medication Sig Dispense Refill  . acetaminophen (TYLENOL) 650 MG CR tablet Take 650 mg by mouth every 6 (six) hours as needed for pain.    [name] albuterol (PROVENTIL HFA;VENTOLIN HFA) 108 (90 Base) MCG/ACT inhaler Inhale 2 puffs into the lungs every 6 (six) hours as needed for wheezing or shortness of breath.    [name] aspirin EC 325 MG EC tablet Take 1 tablet (325 mg total) by mouth daily. 30 tablet 0  . baclofen (LIORESAL) 10 MG tablet TAKE ONE TABLET BY MOUTH AT BEDTIME. 30 tablet 1  . buPROPion (WELLBUTRIN XL) 300 MG 24 hr tablet TAKE ONE TABLET BY MOUTH EVERY MORNING. 30 tablet 2  . calcium carbonate (TUMS - DOSED IN MG ELEMENTAL CALCIUM) 500 MG chewable tablet Chew 1 tablet by mouth as needed for indigestion or heartburn.    . carvedilol (COREG) 6.25 MG tablet Take 1 tablet (6.25 mg total) by mouth 2 (two) times daily. 180 tablet 3  . EPINEPHrine (EPIPEN 2-PAK) 0.3 mg/0.3 mL IJ SOAJ injection Inject 0.3 mg into the muscle once.    [name] esomeprazole (NEXIUM) 40 MG capsule Take 40 mg by mouth 2 (two) times daily as needed.   12  . ferrous sulfate 325 (65 FE) MG tablet Take 325 mg by mouth 2 (two) times daily with a meal.    . gabapentin (NEURONTIN) 300 MG capsule Take 1 capsule (300 mg total) by mouth 2 (two) times daily.    . hydrOXYzine  (ATARAX/VISTARIL) 10 MG tablet Take 1 tablet (10 mg total) by mouth 3 (three) times daily as needed for itching. 30 tablet 0  . Insulin Glargine (TOUJEO SOLOSTAR) 300 UNIT/ML SOPN Inject 40 Units into the skin at bedtime. 6 pen 2  . lamoTRIgine (LAMICTAL) 150 MG tablet TAKE ONE TABLET BY MOUTH AT BEDTIME. 30 tablet 2  . levothyroxine (SYNTHROID, LEVOTHROID) 100 MCG tablet Take 100 mcg by mouth daily before breakfast.     . losartan (COZAAR) 25 MG tablet Take 1 tablet (25 mg  total) by mouth daily. 90 tablet 3  . meclizine (ANTIVERT) 25 MG tablet Take 1 tablet (25 mg total) by mouth 3 (three) times daily as needed for dizziness. (Patient taking differently: Take 25 mg by mouth daily as needed for dizziness. ) 30 tablet 0  . metFORMIN (GLUCOPHAGE) 1000 MG tablet Take 1,000 mg by mouth 2 (two) times daily with a meal.    . Polyvinyl Alcohol-Povidone (REFRESH OP) Place 2 drops into both eyes daily as needed (for dry eyes).    . potassium chloride SA (K-DUR,KLOR-CON) 20 MEQ tablet TAKE ONE TABLET BY MOUTH DAILY 30 tablet 3  . rOPINIRole (REQUIP) 2 MG tablet Take 2 mg by mouth 3 (three) times daily. .    . rosuvastatin (CRESTOR) 5 MG tablet Take 5 mg by mouth daily.    [name] torsemide (DEMADEX) 20 MG tablet Take 20 mg by mouth daily.  5  . traMADol (ULTRAM) 50 MG tablet Take 100 mg by mouth 4 (four) times daily as needed.     . traZODone (DESYREL) 50 MG tablet Take 50 mg by mouth at bedtime.  5  . venlafaxine XR (EFFEXOR-XR) 150 MG 24 hr capsule TAKE TWO (2) CAPSULES BY MOUTH AT BEDTIME. (Patient taking differently: TAKE 300 MG BY MOUTH AT BEDTIME) 60 capsule 2  . Zinc Sulfate (ZINC-220 PO) Take 1 tablet by mouth daily.     No current facility-administered medications for this visit.     Allergies  Allergen Reactions  . Amoxicillin Anaphylaxis and Other (See Comments)    Has patient had a PCN reaction causing immediate rash, facial/tongue/throat swelling, SOB or lightheadedness with hypotension: Yes Has  patient had a PCN reaction causing severe rash involving mucus membranes or skin necrosis: Yes Has patient had a PCN reaction that required hospitalization: Yes Has patient had a PCN reaction occurring within the last 10 years: Yes If all of the above answers are "NO", then may proceed with Cephalosporin use.   [name] Hydrocodone Anaphylaxis  . Levaquin [Levofloxacin] Rash  . Depacon [Valproic Acid] Other (See Comments)    Causes falls     Review of Systems  No symptoms of angina or edema or fever  Some dizziness  BP 136/64 (BP Location: Left Arm, Patient Position: Sitting, Cuff Size: Small) Comment (Cuff Size): MANUALLY  Pulse 76   Resp 16   Ht 5\' 3"  (1.6 m)   Wt 260 lb (117.9 kg)   SpO2 96% Comment: ON RA  BMI 46.06 kg/m  Physical Exam      Exam    General- alert and comfortable    Neck- no JVD, no cervical adenopathy palpable, no carotid bruit   Lungs- clear without rales, wheezes   Cor- regular rate and rhythm, no murmur , gallop   Abdomen- soft, non-tender   Extremities - warm, non-tender, minimal edema   Neuro- oriented, appropriate, no focal weakness   Diagnostic Tests: None  Impression: Superficial wound of leg healing. No more antibiotics required. Continue packing until it is to shallow and then use Neosporin and a Band-Aid dressing  Plan: Return in 3 weeks for wound check.   Len Childs, MD [facility] [phone]

## 2017-11-25 ENCOUNTER — Encounter: Payer: Self-pay | Admitting: "Endocrinology

## 2017-12-10 ENCOUNTER — Encounter: Payer: Self-pay | Admitting: Cardiothoracic Surgery

## 2017-12-10 ENCOUNTER — Other Ambulatory Visit: Payer: Self-pay | Admitting: *Deleted

## 2017-12-10 ENCOUNTER — Ambulatory Visit (INDEPENDENT_AMBULATORY_CARE_PROVIDER_SITE_OTHER): Payer: PPO | Admitting: Cardiothoracic Surgery

## 2017-12-10 ENCOUNTER — Ambulatory Visit
Admission: RE | Admit: 2017-12-10 | Discharge: 2017-12-10 | Disposition: A | Discharge status: Home / Self Care | Payer: PPO | Source: Ambulatory Visit | Attending: Cardiothoracic Surgery | Admitting: Cardiothoracic Surgery

## 2017-12-10 VITALS — BP 129/61 | HR 86 | Resp 20 | Ht 63.0 in | Wt 266.0 lb

## 2017-12-10 DIAGNOSIS — I25110 Atherosclerotic heart disease of native coronary artery with unstable angina pectoris: Secondary | ICD-10-CM

## 2017-12-10 DIAGNOSIS — Z951 Presence of aortocoronary bypass graft: Secondary | ICD-10-CM

## 2017-12-10 DIAGNOSIS — R0602 Shortness of breath: Secondary | ICD-10-CM

## 2017-12-10 NOTE — Progress Notes (Signed)
PCP is Sinda Du, MD Referring Provider is Arnoldo Lenis, MD  Chief Complaint  Patient presents with  . Routine Post Op    4 week f/u for wound check, EVH site and sternal incision, s/p CABG    HPI: Patient returns for final postop wound check after urgent multivessel CABG earlier this summer.  Patient is morbidly obese with diabetes and had a superficial lower sternal incision breakdown with drainage which was treated with topical wound care and antibiotics.  It has now healed.  She then developed a gram-negative infection of her right lower leg saphenous vein harvest tunnel which required debridement packing and antibiotics.  It has now healed.  The patient denies angina.  She does note shortness of breath with exertion.  She understands she is very deconditioned and is looking forward to starting cardiac rehab which will be fine now that her incisions are all healed.  I reviewed her last chest x-ray which was clear and have ordered another one today which is pending.  Her saturation at rest is 97% on room air and does not decrease with exercise.   Past Medical History:  Diagnosis Date  . Anemia   . Anxiety   . Bipolar disorder ([place])   . Bulging lumbar disc    L3-4  . Chronic daily headache   . Chronic low back pain 09/20/2014  . COPD (chronic obstructive pulmonary disease) ([place])   . Degenerative arthritis   . Depression   . Diabetes mellitus, type II ([place])   . DM type 2 with diabetic peripheral neuropathy ([place]) 09/20/2014  . Dyslipidemia   . Dyspnea   . Gastroparesis   . GERD (gastroesophageal reflux disease)   . Heart murmur   . History of hiatal hernia   . Hypothyroidism   . IBS (irritable bowel syndrome)   . Memory difficulty 09/20/2014  . Morbid obesity ([place])   . Neuropathy   . Obstructive sleep apnea on CPAP   . Restless legs syndrome (RLS) 09/17/2012  . Stroke (cerebrum) ([place]) 05/16/2017   Left parietal    Past Surgical History:  Procedure Laterality Date  .  ABDOMINAL HYSTERECTOMY    . ARTHROSCOPY KNEE W/ DRILLING  06/2011   and Decemer of 2013.  [name] Carpel tunnel  1980's  . CATARACT EXTRACTION W/PHACO Right 03/21/2017   Procedure: CATARACT EXTRACTION PHACO AND INTRAOCULAR LENS PLACEMENT RIGHT EYE;  Surgeon: Baruch Goldmann, MD;  Location: [facility];  Service: Ophthalmology;  Laterality: Right;  CDE: 2.91   . CATARACT EXTRACTION W/PHACO Left 04/18/2017   Procedure: CATARACT EXTRACTION PHACO AND INTRAOCULAR LENS PLACEMENT LEFT EYE;  Surgeon: Baruch Goldmann, MD;  [facility];  Service: Ophthalmology;  Laterality: Left;  left  . CHOLECYSTECTOMY    . COLONOSCOPY WITH PROPOFOL N/A 10/04/2016   Procedure: COLONOSCOPY WITH PROPOFOL;  Surgeon: Rogene Houston, MD;  [facility];  Service: Endoscopy;  Laterality: N/A;  11:10  . CORONARY ARTERY BYPASS GRAFT N/A 08/29/2017   Procedure: CORONARY ARTERY BYPASS GRAFTING (CABG) x 3 WITH ENDOSCOPIC HARVESTING OF RIGHT SAPHENOUS VEIN;  Surgeon: Ivin Poot, MD;  Location: [place];  Service: Open Heart Surgery;  Laterality: N/A;  . ESOPHAGOGASTRODUODENOSCOPY N/A 04/25/2016   Procedure: ESOPHAGOGASTRODUODENOSCOPY (EGD);  Surgeon: Rogene Houston, MD;  [facility];  Service: Endoscopy;  Laterality: N/A;  730  . LEFT HEART CATH AND CORONARY ANGIOGRAPHY N/A 08/27/2017   Procedure: LEFT HEART CATH AND CORONARY ANGIOGRAPHY;  Surgeon: Sherren Mocha, MD;  Location:  [place] [facility];  Service: Cardiovascular::  pLAD 95% - p-mLAD 50%, ostD1 90% -pD1 80%; ostOM1 90%; rPDA 80%. EF ~50-55% - HK of dital Anterolateral & Apical wall.  - Rec CVTS c/s  . RECTAL SURGERY     fissure  . SHOULDER SURGERY Left    arthroscopy in March of this year  . TEE WITHOUT CARDIOVERSION N/A 08/29/2017   Procedure: TRANSESOPHAGEAL ECHOCARDIOGRAM (TEE);  Surgeon: Prescott Gum, Collier Salina, MD;  Location: [place];  Service: Open Heart Surgery;  Laterality: N/A;  . TRANSTHORACIC ECHOCARDIOGRAM  06/06/2017   Mild to moderate reduced EF 40  and 45%.  Anterior septal, inferoseptal and basal to mid inferior hypokinesis.  GR 1 DD.  No significant valvular lesion    Family History  Problem Relation Age of Onset  . Hypertension Mother   . Lymphoma Mother   . Depression Mother   . Arthritis Father   . Alcohol abuse Father   . Hypertension Sister   . Cancer Brother        kidney and lung  . Alcohol abuse Brother   . Alcohol abuse Paternal Uncle   . Alcohol abuse Paternal Grandfather   . Alcohol abuse Paternal Grandmother     Social History Social History   Tobacco Use  . Smoking status: Former Smoker    Types: Cigarettes    Last attempt to quit: 02/04/1971    Years since quitting: 46.8  . Smokeless tobacco: Never Used  . Tobacco comment: smoked 2 cigarettes a day  Substance Use Topics  . Alcohol use: No    Alcohol/week: 0.0 standard drinks  . Drug use: No    Current Outpatient Medications  Medication Sig Dispense Refill  . acetaminophen (TYLENOL) 650 MG CR tablet Take 650 mg by mouth every 6 (six) hours as needed for pain.    [name] albuterol (PROVENTIL HFA;VENTOLIN HFA) 108 (90 Base) MCG/ACT inhaler Inhale 2 puffs into the lungs every 6 (six) hours as needed for wheezing or shortness of breath.    [name] aspirin EC 325 MG EC tablet Take 1 tablet (325 mg total) by mouth daily. 30 tablet 0  . baclofen (LIORESAL) 10 MG tablet TAKE ONE TABLET BY MOUTH AT BEDTIME. 30 tablet 1  . buPROPion (WELLBUTRIN XL) 300 MG 24 hr tablet TAKE ONE TABLET BY MOUTH EVERY MORNING. 30 tablet 2  . calcium carbonate (TUMS - DOSED IN MG ELEMENTAL CALCIUM) 500 MG chewable tablet Chew 1 tablet by mouth as needed for indigestion or heartburn.    . carvedilol (COREG) 6.25 MG tablet Take 1 tablet (6.25 mg total) by mouth 2 (two) times daily. 180 tablet 3  . EPINEPHrine (EPIPEN 2-PAK) 0.3 mg/0.3 mL IJ SOAJ injection Inject 0.3 mg into the muscle once.    [name] esomeprazole (NEXIUM) 40 MG capsule Take 40 mg by mouth 2 (two) times daily as needed.   12  .  ferrous sulfate 325 (65 FE) MG tablet Take 325 mg by mouth 2 (two) times daily with a meal.    . gabapentin (NEURONTIN) 300 MG capsule Take 1 capsule (300 mg total) by mouth 2 (two) times daily.    . hydrOXYzine (ATARAX/VISTARIL) 10 MG tablet Take 1 tablet (10 mg total) by mouth 3 (three) times daily as needed for itching. 30 tablet 0  . Insulin Glargine (TOUJEO SOLOSTAR) 300 UNIT/ML SOPN Inject 40 Units into the skin at bedtime. 6 pen 2  . lamoTRIgine (LAMICTAL) 150 MG tablet TAKE ONE TABLET BY MOUTH AT BEDTIME.  30 tablet 2  . levothyroxine (SYNTHROID, LEVOTHROID) 100 MCG tablet Take 100 mcg by mouth daily before breakfast.     . losartan (COZAAR) 25 MG tablet Take 1 tablet (25 mg total) by mouth daily. 90 tablet 3  . meclizine (ANTIVERT) 25 MG tablet Take 1 tablet (25 mg total) by mouth 3 (three) times daily as needed for dizziness. (Patient taking differently: Take 25 mg by mouth daily as needed for dizziness. ) 30 tablet 0  . metFORMIN (GLUCOPHAGE) 1000 MG tablet Take 1,000 mg by mouth 2 (two) times daily with a meal.    . Polyvinyl Alcohol-Povidone (REFRESH OP) Place 2 drops into both eyes daily as needed (for dry eyes).    . potassium chloride SA (K-DUR,KLOR-CON) 20 MEQ tablet TAKE ONE TABLET BY MOUTH DAILY 30 tablet 3  . rOPINIRole (REQUIP) 2 MG tablet Take 2 mg by mouth 3 (three) times daily. .    . torsemide (DEMADEX) 20 MG tablet Take 20 mg by mouth daily.  5  . traMADol (ULTRAM) 50 MG tablet Take 100 mg by mouth 4 (four) times daily as needed.     . traZODone (DESYREL) 50 MG tablet Take 50 mg by mouth at bedtime.  5  . venlafaxine XR (EFFEXOR-XR) 150 MG 24 hr capsule TAKE TWO (2) CAPSULES BY MOUTH AT BEDTIME. (Patient taking differently: TAKE 300 MG BY MOUTH AT BEDTIME) 60 capsule 2  . Zinc Sulfate (ZINC-220 PO) Take 1 tablet by mouth daily.    . rosuvastatin (CRESTOR) 5 MG tablet Take 5 mg by mouth daily.     No current facility-administered medications for this visit.     Allergies   Allergen Reactions  . Amoxicillin Anaphylaxis and Other (See Comments)    Has patient had a PCN reaction causing immediate rash, facial/tongue/throat swelling, SOB or lightheadedness with hypotension: Yes Has patient had a PCN reaction causing severe rash involving mucus membranes or skin necrosis: Yes Has patient had a PCN reaction that required hospitalization: Yes Has patient had a PCN reaction occurring within the last 10 years: Yes If all of the above answers are "NO", then may proceed with Cephalosporin use.   [name] Hydrocodone Anaphylaxis  . Levaquin [Levofloxacin] Rash  . Depacon [Valproic Acid] Other (See Comments)    Causes falls     Review of Systems  Weight stable no fever  BP 129/61   Pulse 86   Resp 20   Ht 5\' 3"  (1.6 m)   Wt 266 lb (120.7 kg)   SpO2 97% Comment: RA  BMI 47.12 kg/m  Physical Exam      Exam    General- alert and comfortable    Neck- no JVD, no cervical adenopathy palpable, no carotid bruit   Lungs- clear without rales, wheezes   Cor- regular rate and rhythm, no murmur , gallop   Abdomen- soft, non-tender   Extremities - warm, non-tender, minimal edema   Neuro- oriented, appropriate, no focal weakness   Diagnostic Tests: Chest x-ray pending today  Impression: Surgical wounds now healed Patient ready for cardiac rehab Plan: Importance of heart healthy diet and heart healthy lifestyle emphasized to patient.  She will return as needed.  Len Childs, MD [facility] [phone]

## 2017-12-11 ENCOUNTER — Other Ambulatory Visit: Payer: Self-pay | Admitting: Nurse Practitioner

## 2017-12-11 ENCOUNTER — Telehealth: Payer: Self-pay | Admitting: Cardiology

## 2017-12-11 MED ORDER — BACLOFEN 10 MG PO TABS: 10.0000 mg | ORAL_TABLET | Freq: Every day | ORAL | 6 refills | Status: DC

## 2017-12-11 NOTE — Telephone Encounter (Signed)
Wants to know if she can be referred for cardiac rehab to [facility] --states she can't get in here at [facility] till December.

## 2017-12-11 NOTE — Telephone Encounter (Signed)
[facility] does not have cardiac rehab, Dr.Branch is going to speak with Norma [name] and let us know how he wants to precede

## 2017-12-12 ENCOUNTER — Ambulatory Visit: Payer: PPO | Admitting: "Endocrinology

## 2017-12-16 ENCOUNTER — Ambulatory Visit: Payer: PPO | Admitting: "Endocrinology

## 2017-12-24 ENCOUNTER — Ambulatory Visit (INDEPENDENT_AMBULATORY_CARE_PROVIDER_SITE_OTHER): Payer: PPO | Admitting: Orthopaedic Surgery

## 2017-12-24 ENCOUNTER — Ambulatory Visit (INDEPENDENT_AMBULATORY_CARE_PROVIDER_SITE_OTHER): Payer: PPO

## 2017-12-24 ENCOUNTER — Ambulatory Visit (INDEPENDENT_AMBULATORY_CARE_PROVIDER_SITE_OTHER): Payer: Self-pay

## 2017-12-24 ENCOUNTER — Encounter (INDEPENDENT_AMBULATORY_CARE_PROVIDER_SITE_OTHER): Payer: Self-pay | Admitting: Orthopaedic Surgery

## 2017-12-24 VITALS — BP 113/61 | HR 81 | Ht 64.0 in | Wt 260.0 lb

## 2017-12-24 DIAGNOSIS — G8929 Other chronic pain: Secondary | ICD-10-CM | POA: Diagnosis not present

## 2017-12-24 DIAGNOSIS — M25562 Pain in left knee: Secondary | ICD-10-CM

## 2017-12-24 DIAGNOSIS — M25561 Pain in right knee: Secondary | ICD-10-CM | POA: Diagnosis not present

## 2017-12-24 MED ORDER — METHYLPREDNISOLONE ACETATE 40 MG/ML IJ SUSP
80.0000 mg | INTRAMUSCULAR | Status: AC | PRN
  Administered 2017-12-24: 80 mg

## 2017-12-24 MED ORDER — LIDOCAINE HCL 1 % IJ SOLN
2.0000 mL | INTRAMUSCULAR | Status: AC | PRN
  Administered 2017-12-24: 2 mL

## 2017-12-24 MED ORDER — BUPIVACAINE HCL 0.5 % IJ SOLN
2.0000 mL | INTRAMUSCULAR | Status: AC | PRN
  Administered 2017-12-24: 2 mL via INTRA_ARTICULAR

## 2017-12-24 NOTE — Progress Notes (Signed)
Office Visit Note   Patient: Norma [name]           Date of Birth: 10/22/1950           MRN: [id number] Visit Date: 12/24/2017              Requested by: Sinda Du, MD [place], [place] [address] PCP: Sinda Du, MD   Assessment & Plan: Visit Diagnoses:  1. Chronic pain of left knee   2. Chronic pain of right knee     Plan: End-stage osteoarthritis both knees.  Long discussion regarding diagnosis and treatment options.  The definitive procedure would be total knee replacement Norma [name] has an elevated BMI of nearly 45 and recent CABG procedure and she needs to work on her weight and her cardiac rehab before even considering the surgery.  She would have to decrease her weight by at least 40 pounds to have a BMI less than 40.  Will inject her right knee with cortisone.  She is aware that it may raise her blood sugars.  Office 2 weeks to inject left knee  Follow-Up Instructions: Return in about 2 weeks (around 01/07/2018).   Orders:  Orders Placed This Encounter  Procedures  . Large Joint Inj: R knee  . XR KNEE 3 VIEW LEFT  . XR KNEE 3 VIEW RIGHT   No orders of the defined types were placed in this encounter.     Procedures: Large Joint Inj: R knee on 12/24/2017 10:33 AM Indications: pain and diagnostic evaluation Details: 25 G 1.5 in needle, anteromedial approach  Arthrogram: No  Medications: 2 mL lidocaine 1 %; 2 mL bupivacaine 0.5 %; 80 mg methylPREDNISolone acetate 40 MG/ML Procedure, treatment alternatives, risks and benefits explained, specific risks discussed. Consent was given by the patient. Immediately prior to procedure a time out was called to verify the correct patient, procedure, equipment, support staff and site/side marked as required. Patient was prepped and draped in the usual sterile fashion.       Clinical Data: No additional findings.   Subjective: Chief Complaint  Patient presents with  . New Patient  (Initial Visit)    BI LAT KNEE PAIN FOR YRS SEEN DR Curlene Labrum HAD ARTHRO ON BOTH KNEES ABOUT 5-6 YRS AGO  Norma [name] 67 years old and has a chronic history of bilateral knee pain.  She is had prior treatment by Dr. Vanita Panda with arthroscopy, cortisone injections and Visco supplementation.  Continues to have pain to the point of compromise.  She is notes that she has chronic achiness and soreness oftentimes to the point of compromise.  She recently had a CABG and is in the midst of cardiac rehab.  She does take Tylenol, aspirin, baclofen and gabapentin.  She is insulin-dependent.  She has tramadol for pain.  HPI  Review of Systems  Constitutional: Positive for fatigue. Negative for fever.  HENT: Negative for ear pain.   Eyes: Negative for pain.  Respiratory: Positive for shortness of breath. Negative for cough.   Cardiovascular: Positive for leg swelling.  Gastrointestinal: Negative for constipation and diarrhea.  Genitourinary: Negative for difficulty urinating.  Musculoskeletal: Positive for back pain. Negative for neck pain.  Skin: Negative for rash.  Allergic/Immunologic: Positive for food allergies.  Neurological: Positive for weakness and numbness.  Hematological: Does not bruise/bleed easily.  Psychiatric/Behavioral: Positive for sleep disturbance.     Objective: Vital Signs: BP 113/61 (BP Location: Right Arm, Patient Position: Sitting, Cuff Size: Normal)  Pulse 81   Ht 5\' 4"  (1.626 m)   Wt 260 lb (117.9 kg)   BMI 44.63 kg/m   Physical Exam  Constitutional: She is oriented to person, place, and time. She appears well-developed and well-nourished.  HENT:  Mouth/Throat: Oropharynx is clear and moist.  Eyes: Pupils are equal, round, and reactive to light. EOM are normal.  Pulmonary/Chest: Effort normal.  Neurological: She is alert and oriented to person, place, and time.  Skin: Skin is warm and dry.  Psychiatric: She has a normal mood and affect. Her behavior is normal.     Ortho Exam awake alert.  Skin is is dry she does have some areas of dry skin and that she has scratched.  In terms of her knees be effusions bilaterally.  Knees and legs are large.  Some altered sensibility in both of her feet probably consistent with diabetic neuropathy.  Good refill but poor pulses.  No calf pain.  Has a large lipoma in the lateral aspect of her left ankle which is not symptomatic.  Flexed about 95 degrees when calf touches thigh.  No instability.  Considerable patellar crepitation bilaterally.  Loss of knee extension bilaterally less than 5 degrees.  Specialty Comments:  No specialty comments available.  Imaging: Xr Knee 3 View Left  Result Date: 12/24/2017 Films of the left knee were obtained in 3 projections standing.  Findings are similar to those on the right with end-stage osteoarthritis.  There are large osteophytes at the patellofemoral joint and possible loose body formation.  Alignment is is normal but narrowing of the joint spaces particularly laterally.  Films are consistent with end-stage osteoarthritis in all 3 compartments  Xr Knee 3 View Right  Result Date: 12/24/2017 Terms of the right knee were obtained in several projections standing.  The end-stage osteoarthritis in all 3 compartments.  Peripheral osteophytes subchondral sclerosis and narrowing of the joint line medial and lateral compartments.  There is severe degenerative change at the patellofemoral joint with bony overgrowth and minimal joint space remaining.  Could have several large loose bodies as are consistent with end-stage osteoarthritis    PMFS History: Patient Active Problem List   Diagnosis Date Noted  . Mixed hyperlipidemia 09/11/2017  . Pressure injury of skin 09/02/2017  . Coronary artery disease involving native heart without angina pectoris   . S/P CABG x 3   . Leukocytosis   . Acute blood loss anemia   . Post-operative pain   . Bipolar affective disorder ([place])   . Diabetes  mellitus type 2 in obese ([place])   . History of CVA (cerebrovascular accident)   . Hypokalemia   . Hx of CABG 08/29/2017  . Coronary artery disease involving native coronary artery of native heart with unstable angina pectoris ([place]) 08/28/2017  . Unstable angina ([place]) 08/27/2017  . Cardiomyopathy ([place]) 08/22/2017  . Stroke (cerebrum) ([place]) 05/16/2017  . Low hemoglobin 09/19/2016  . Dizziness 06/21/2016  . Chest pain with moderate risk for cardiac etiology 04/23/2016  . Bipolar I disorder, most recent episode depressed ([place]) 06/05/2015  . Uncontrolled type 2 diabetes mellitus with complication, with long-term current use of insulin ([place]) 09/20/2014  . Chronic low back pain 09/20/2014  . Morbid obesity, unspecified obesity type ([place]) 09/20/2014  . Memory difficulty 09/20/2014  . Lumbosacral spondylosis without myelopathy 12/25/2012  . Gastroparesis 11/04/2012  . Nausea alone 11/04/2012  . Restless legs syndrome (RLS) 09/17/2012  . Headache(784.0) 03/19/2012  . GERD (gastroesophageal reflux disease) 02/16/2012  . Abdominal  pain 02/16/2012  . Hypothyroidism 02/16/2012  . Tremor 02/16/2012  . Hyperlipidemia associated with type 2 diabetes mellitus ([place]) 04/01/2008  . Obstructive sleep apnea 04/01/2008  . Essential hypertension, benign 04/01/2008  . LBBB (left bundle branch block) 04/01/2008   Past Medical History:  Diagnosis Date  . Anemia   . Anxiety   . Bipolar disorder ([place])   . Bulging lumbar disc    L3-4  . Chronic daily headache   . Chronic low back pain 09/20/2014  . COPD (chronic obstructive pulmonary disease) ([place])   . Degenerative arthritis   . Depression   . Diabetes mellitus, type II ([place])   . DM type 2 with diabetic peripheral neuropathy ([place]) 09/20/2014  . Dyslipidemia   . Dyspnea   . Gastroparesis   . GERD (gastroesophageal reflux disease)   . Heart murmur   . History of hiatal hernia   . Hypothyroidism   . IBS (irritable bowel syndrome)   . Memory difficulty  09/20/2014  . Morbid obesity ([place])   . Neuropathy   . Obstructive sleep apnea on CPAP   . Restless legs syndrome (RLS) 09/17/2012  . Stroke (cerebrum) ([place]) 05/16/2017   Left parietal    Family History  Problem Relation Age of Onset  . Hypertension Mother   . Lymphoma Mother   . Depression Mother   . Arthritis Father   . Alcohol abuse Father   . Hypertension Sister   . Cancer Brother        kidney and lung  . Alcohol abuse Brother   . Alcohol abuse Paternal Uncle   . Alcohol abuse Paternal Grandfather   . Alcohol abuse Paternal Grandmother     Past Surgical History:  Procedure Laterality Date  . ABDOMINAL HYSTERECTOMY    . ARTHROSCOPY KNEE W/ DRILLING  06/2011   and Decemer of 2013.  [name] Carpel tunnel  1980's  . CATARACT EXTRACTION W/PHACO Right 03/21/2017   Procedure: CATARACT EXTRACTION PHACO AND INTRAOCULAR LENS PLACEMENT RIGHT EYE;  Surgeon: Baruch Goldmann, MD;  Location: [facility];  Service: Ophthalmology;  Laterality: Right;  CDE: 2.91   . CATARACT EXTRACTION W/PHACO Left 04/18/2017   Procedure: CATARACT EXTRACTION PHACO AND INTRAOCULAR LENS PLACEMENT LEFT EYE;  Surgeon: Baruch Goldmann, MD;  Location: [facility];  Service: Ophthalmology;  Laterality: Left;  left  . CHOLECYSTECTOMY    . COLONOSCOPY WITH PROPOFOL N/A 10/04/2016   Procedure: COLONOSCOPY WITH PROPOFOL;  Surgeon: Rogene Houston, MD;  Location: [facility];  Service: Endoscopy;  Laterality: N/A;  11:10  . CORONARY ARTERY BYPASS GRAFT N/A 08/29/2017   Procedure: CORONARY ARTERY BYPASS GRAFTING (CABG) x 3 WITH ENDOSCOPIC HARVESTING OF RIGHT SAPHENOUS VEIN;  Surgeon: Ivin Poot, MD;  Location: [facility];  Service: Open Heart Surgery;  Laterality: N/A;  . ESOPHAGOGASTRODUODENOSCOPY N/A 04/25/2016   Procedure: ESOPHAGOGASTRODUODENOSCOPY (EGD);  Surgeon: Rogene Houston, MD;  [facility];  Service: Endoscopy;  Laterality: N/A;  730  . LEFT HEART CATH AND CORONARY ANGIOGRAPHY N/A 08/27/2017   Procedure: LEFT HEART CATH  AND CORONARY ANGIOGRAPHY;  Surgeon: Sherren Mocha, MD;  Location: [facility];  Service: Cardiovascular::  pLAD 95% - p-mLAD 50%, ostD1 90% -pD1 80%; ostOM1 90%; rPDA 80%. EF ~50-55% - HK of dital Anterolateral & Apical wall.  - Rec CVTS c/s  . RECTAL SURGERY     fissure  . SHOULDER SURGERY Left    arthroscopy in March of this year  . TEE WITHOUT CARDIOVERSION N/A 08/29/2017  Procedure: TRANSESOPHAGEAL ECHOCARDIOGRAM (TEE);  Surgeon: Prescott Gum, Collier Salina, MD;  Location: [facility];  Service: Open Heart Surgery;  Laterality: N/A;  . TRANSTHORACIC ECHOCARDIOGRAM  06/06/2017   Mild to moderate reduced EF 40 and 45%.  Anterior septal, inferoseptal and basal to mid inferior hypokinesis.  GR 1 DD.  No significant valvular lesion   Social History   Occupational History    Employer: [employer]  Tobacco Use  . Smoking status: Former Smoker    Types: Cigarettes    Last attempt to quit: 02/04/1971    Years since quitting: 46.9  . Smokeless tobacco: Never Used  . Tobacco comment: smoked 2 cigarettes a day  Substance and Sexual Activity  . Alcohol use: No    Alcohol/week: 0.0 standard drinks  . Drug use: No  . Sexual activity: Never

## 2018-01-06 ENCOUNTER — Encounter (INDEPENDENT_AMBULATORY_CARE_PROVIDER_SITE_OTHER): Payer: Self-pay | Admitting: Internal Medicine

## 2018-01-06 ENCOUNTER — Ambulatory Visit (INDEPENDENT_AMBULATORY_CARE_PROVIDER_SITE_OTHER): Payer: PPO | Admitting: Internal Medicine

## 2018-01-06 VITALS — BP 112/70 | HR 66 | Temp 98.6°F | Resp 18 | Ht 64.0 in | Wt 264.9 lb

## 2018-01-06 DIAGNOSIS — Z862 Personal history of diseases of the blood and blood-forming organs and certain disorders involving the immune mechanism: Secondary | ICD-10-CM

## 2018-01-06 DIAGNOSIS — K219 Gastro-esophageal reflux disease without esophagitis: Secondary | ICD-10-CM | POA: Diagnosis not present

## 2018-01-06 MED ORDER — OMEPRAZOLE 40 MG PO CPDR: 40.0000 mg | DELAYED_RELEASE_CAPSULE | Freq: Every day | ORAL | 3 refills | Status: DC | PRN

## 2018-01-06 NOTE — Patient Instructions (Signed)
Please have lab send [place] your next blood work/CBC.

## 2018-01-06 NOTE — Progress Notes (Signed)
Presenting complaint;  Follow-up for chronic GERD and abdominal pain. She has history of iron deficiency anemia.  Database and subjective:  Patient is 67 year old Caucasian female who is here for scheduled visit.  She was last seen 1 year ago.  She has chronic GERD as well as history of IBS dating back to 2011.  Her iron deficiency anemia felt to be due to impaired iron absorption due to chronic PPI therapy.  She also has a history of chest and abdominal pain.  Chest pain felt to be musculoskeletal.  Work-up for abdominal pain has been negative. She states she has not had abdominal pain since she went on lactose-free diet.  She says heartburn is well controlled with Nexium but her co-pay is $40 a month.  She is taking Nexium 2-3 times a week. she tells me omeprazole will cost her nothing.  She therefore want her PPI to be switched. Since her last visit she has undergone CABG which was in May 2019.  During that admission she did require blood transfusion.  She also had right cataract extraction in December 2018 and left fine in January 2019.  She states she had MRI in January 2019 and she was told that she had many CVAs in the past. She states recent hemoglobin was close to normal.  She does not remember the value.  She has chronic back pain.  She has multiple bulging disks.  She has gained 14 pounds since her last visit.  She says she is not able to do much exercise on account of her chronic pain and arthritis.  Current Medications: Outpatient Encounter Medications as of 01/06/2018  Medication Sig  . albuterol (PROVENTIL HFA;VENTOLIN HFA) 108 (90 Base) MCG/ACT inhaler Inhale 2 puffs into the lungs every 6 (six) hours as needed for wheezing or shortness of breath.  [name] aspirin EC 325 MG EC tablet Take 1 tablet (325 mg total) by mouth daily.  . baclofen (LIORESAL) 10 MG tablet Take 1 tablet (10 mg total) by mouth at bedtime.  [name] buPROPion (WELLBUTRIN XL) 300 MG 24 hr tablet TAKE ONE TABLET BY MOUTH EVERY  MORNING.  . calcium carbonate (TUMS - DOSED IN MG ELEMENTAL CALCIUM) 500 MG chewable tablet Chew 2 tablets by mouth as needed for indigestion or heartburn.   . carvedilol (COREG) 6.25 MG tablet Take 1 tablet (6.25 mg total) by mouth 2 (two) times daily.  [name] EPINEPHrine (EPIPEN 2-PAK) 0.3 mg/0.3 mL IJ SOAJ injection Inject 0.3 mg into the muscle once.  [name] esomeprazole (NEXIUM) 40 MG capsule Take 40 mg by mouth 2 (two) times daily as needed.   . ferrous sulfate 325 (65 FE) MG tablet Take 325 mg by mouth 2 (two) times daily with a meal.  . gabapentin (NEURONTIN) 300 MG capsule Take 1 capsule (300 mg total) by mouth 2 (two) times daily.  . hydrOXYzine (ATARAX/VISTARIL) 10 MG tablet Take 1 tablet (10 mg total) by mouth 3 (three) times daily as needed for itching.  . Insulin Glargine (TOUJEO SOLOSTAR) 300 UNIT/ML SOPN Inject 40 Units into the skin at bedtime.  . Lactase (LACTAID FAST ACT) 9000 units TABS Take by mouth. Patient states that she takes as needed.  . lamoTRIgine (LAMICTAL) 150 MG tablet TAKE ONE TABLET BY MOUTH AT BEDTIME.  [name] levothyroxine (SYNTHROID, LEVOTHROID) 100 MCG tablet Take 100 mcg by mouth daily before breakfast.   . losartan (COZAAR) 25 MG tablet Take 1 tablet (25 mg total) by mouth daily.  . meclizine (ANTIVERT) 25 MG tablet  Take 1 tablet (25 mg total) by mouth 3 (three) times daily as needed for dizziness. (Patient taking differently: Take 25 mg by mouth daily as needed for dizziness. )  . metFORMIN (GLUCOPHAGE) 1000 MG tablet Take 1,000 mg by mouth 2 (two) times daily with a meal.  . Multiple Vitamin (MULTIVITAMIN) capsule Take 1 capsule by mouth daily.  . Polyvinyl Alcohol-Povidone (REFRESH OP) Place 2 drops into both eyes daily as needed (for dry eyes).  . potassium chloride SA (K-DUR,KLOR-CON) 20 MEQ tablet TAKE ONE TABLET BY MOUTH DAILY  . rOPINIRole (REQUIP) 2 MG tablet Take 2 mg by mouth 3 (three) times daily. .  . torsemide (DEMADEX) 20 MG tablet Take 20 mg by mouth daily.   . traMADol (ULTRAM) 50 MG tablet Take 100 mg by mouth 4 (four) times daily as needed.   . traZODone (DESYREL) 50 MG tablet Take 50 mg by mouth at bedtime.  [name] venlafaxine XR (EFFEXOR-XR) 150 MG 24 hr capsule TAKE TWO (2) CAPSULES BY MOUTH AT BEDTIME. (Patient taking differently: TAKE 300 MG BY MOUTH AT BEDTIME)  . rosuvastatin (CRESTOR) 5 MG tablet Take 5 mg by mouth daily.  . [DISCONTINUED] acetaminophen (TYLENOL) 650 MG CR tablet Take 650 mg by mouth every 6 (six) hours as needed for pain.  . [DISCONTINUED] Zinc Sulfate (ZINC-220 PO) Take 1 tablet by mouth daily.   No facility-administered encounter medications on file as of 01/06/2018.      Objective: Blood pressure 112/70, pulse 66, temperature 98.6 F (37 C), temperature source Oral, resp. rate 18, height 5\' 4"  (1.626 m), weight 264 lb 14.4 oz (120.2 kg). Patient is alert and in no acute distress. Conjunctiva is pink. Sclera is nonicteric Oropharyngeal mucosa is normal. No neck masses or thyromegaly noted. Cardiac exam with regular rhythm normal S1 and S2. No murmur or gallop noted. Lungs are clear to auscultation. Abdomen is protuberant.  She has 2 superficial ulcers below the level of umbilicus on either side of the midline.  There is no drainage.  On palpation her abdomen is soft and nontender with organomegaly or masses. No LE edema or clubbing noted. She has submucosal lesion partly covering the left lateral malleolus consistent with lipoma.  It is size of a small tangerine.  Labs/studies Results:  Recent H&H not available.  Assessment:  #1.  Chronic GERD.  She is doing well with PPI.  PPI will be changed for cost reasons.  Hopefully it will still would be effective.  #2.  History of abdominal pain which has resolved since she change her diet.  She is using Lactaid with dairy products.  #3.  History of iron deficiency anemia.  Iron deficiency anemia dates back to about 8 years.  She had EGD in January 2018 and colonoscopy  in June 2018 last year.   Plan:  Discontinue Nexium. Begin omeprazole 40 mg p.o. daily PRN. Request copy of recent blood work for review. Office visit in 1 year.

## 2018-01-07 ENCOUNTER — Ambulatory Visit (INDEPENDENT_AMBULATORY_CARE_PROVIDER_SITE_OTHER): Payer: PPO | Admitting: Orthopaedic Surgery

## 2018-01-09 ENCOUNTER — Encounter (HOSPITAL_COMMUNITY): Payer: PPO

## 2018-01-20 ENCOUNTER — Encounter: Payer: Self-pay | Admitting: Cardiology

## 2018-01-20 ENCOUNTER — Ambulatory Visit: Payer: PPO | Admitting: Cardiology

## 2018-01-20 ENCOUNTER — Ambulatory Visit: Payer: PPO | Admitting: "Endocrinology

## 2018-01-20 VITALS — BP 128/76 | HR 86 | Ht 64.0 in | Wt 271.0 lb

## 2018-01-20 DIAGNOSIS — I9789 Other postprocedural complications and disorders of the circulatory system, not elsewhere classified: Secondary | ICD-10-CM | POA: Diagnosis not present

## 2018-01-20 DIAGNOSIS — I4891 Unspecified atrial fibrillation: Secondary | ICD-10-CM | POA: Diagnosis not present

## 2018-01-20 DIAGNOSIS — I2510 Atherosclerotic heart disease of native coronary artery without angina pectoris: Secondary | ICD-10-CM | POA: Diagnosis not present

## 2018-01-20 DIAGNOSIS — R0602 Shortness of breath: Secondary | ICD-10-CM | POA: Diagnosis not present

## 2018-01-20 DIAGNOSIS — E782 Mixed hyperlipidemia: Secondary | ICD-10-CM | POA: Diagnosis not present

## 2018-01-20 DIAGNOSIS — Z23 Encounter for immunization: Secondary | ICD-10-CM | POA: Diagnosis not present

## 2018-01-20 DIAGNOSIS — I5023 Acute on chronic systolic (congestive) heart failure: Secondary | ICD-10-CM

## 2018-01-20 MED ORDER — PRAVASTATIN SODIUM 20 MG PO TABS: 20.0000 mg | ORAL_TABLET | Freq: Every evening | ORAL | 3 refills | Status: DC

## 2018-01-20 MED ORDER — ASPIRIN EC 81 MG PO TBEC: 81.0000 mg | DELAYED_RELEASE_TABLET | Freq: Every day | ORAL | 3 refills | Status: AC

## 2018-01-20 NOTE — Progress Notes (Signed)
Clinical Summary Norma [name] is a 67 y.o.female seen today for follow up of the following medical problems.  1. Chest pain - 05/2017 echo LVEF 40-45% - 08/2017 cath for unstable angina: LAD 95%, D1 90%, RPDA 80%, OM 90%.  - CT surgery was consulted. 08/30/17 CABG with sequential SVG to diag and LAD, SVG-OM.      - last visit we stopped lopressor due to fatigue.  - home weights around 267- 271 lbs, up from 254 lbs reported last vist. Has had some increased SOB, LE edema - labs due to with Dr Dorris Fetch next week including CMET  2. Chronic LBBB  3. Postop afib - started on amio after CABG  - off amio. No recent palpitations.    4. GERD - followed by GI Dr Laural Golden  5. Hyperlipidemia - off crestor currently due to muscle aches, stopped she in August. Symptoms mildly improved.   Past Medical History:  Diagnosis Date  . Anemia   . Anxiety   . Bipolar disorder ([place])   . Bulging lumbar disc    L3-4  . Chronic daily headache   . Chronic low back pain 09/20/2014  . COPD (chronic obstructive pulmonary disease) ([place])   . Degenerative arthritis   . Depression   . Diabetes mellitus, type II ([place])   . DM type 2 with diabetic peripheral neuropathy ([place]) 09/20/2014  . Dyslipidemia   . Dyspnea   . Gastroparesis   . GERD (gastroesophageal reflux disease)   . Heart murmur   . History of hiatal hernia   . Hypothyroidism   . IBS (irritable bowel syndrome)   . Memory difficulty 09/20/2014  . Morbid obesity ([place])   . Neuropathy   . Obstructive sleep apnea on CPAP   . Restless legs syndrome (RLS) 09/17/2012  . Stroke (cerebrum) ([place]) 05/16/2017   Left parietal     Allergies  Allergen Reactions  . Amoxicillin Anaphylaxis and Other (See Comments)    Has patient had a PCN reaction causing immediate rash, facial/tongue/throat swelling, SOB or lightheadedness with hypotension: Yes Has patient had a PCN reaction causing severe rash involving mucus membranes or skin necrosis: Yes Has patient  had a PCN reaction that required hospitalization: Yes Has patient had a PCN reaction occurring within the last 10 years: Yes If all of the above answers are "NO", then may proceed with Cephalosporin use.   [name] Hydrocodone Anaphylaxis  . Vancomycin Itching  . Levaquin [Levofloxacin] Rash  . Depacon [Valproic Acid] Other (See Comments)    Causes falls      Current Outpatient Medications  Medication Sig Dispense Refill  . albuterol (PROVENTIL HFA;VENTOLIN HFA) 108 (90 Base) MCG/ACT inhaler Inhale 2 puffs into the lungs every 6 (six) hours as needed for wheezing or shortness of breath.    [name] aspirin EC 325 MG EC tablet Take 1 tablet (325 mg total) by mouth daily. 30 tablet 0  . baclofen (LIORESAL) 10 MG tablet Take 1 tablet (10 mg total) by mouth at bedtime. 30 tablet 6  . buPROPion (WELLBUTRIN XL) 300 MG 24 hr tablet TAKE ONE TABLET BY MOUTH EVERY MORNING. 30 tablet 2  . calcium carbonate (TUMS - DOSED IN MG ELEMENTAL CALCIUM) 500 MG chewable tablet Chew 2 tablets by mouth as needed for indigestion or heartburn.     . carvedilol (COREG) 6.25 MG tablet Take 1 tablet (6.25 mg total) by mouth 2 (two) times daily. 180 tablet 3  . EPINEPHrine (EPIPEN 2-PAK) 0.3 mg/0.3 mL  IJ SOAJ injection Inject 0.3 mg into the muscle once.    . gabapentin (NEURONTIN) 300 MG capsule Take 1 capsule (300 mg total) by mouth 2 (two) times daily.    . hydrOXYzine (ATARAX/VISTARIL) 10 MG tablet Take 1 tablet (10 mg total) by mouth 3 (three) times daily as needed for itching. 30 tablet 0  . Insulin Glargine (TOUJEO SOLOSTAR) 300 UNIT/ML SOPN Inject 40 Units into the skin at bedtime. 6 pen 2  . Lactase (LACTAID FAST ACT) 9000 units TABS Take by mouth. Patient states that she takes as needed.    . lamoTRIgine (LAMICTAL) 150 MG tablet TAKE ONE TABLET BY MOUTH AT BEDTIME. 30 tablet 2  . levothyroxine (SYNTHROID, LEVOTHROID) 100 MCG tablet Take 100 mcg by mouth daily before breakfast.     . losartan (COZAAR) 25 MG tablet Take 1  tablet (25 mg total) by mouth daily. 90 tablet 3  . meclizine (ANTIVERT) 25 MG tablet Take 1 tablet (25 mg total) by mouth 3 (three) times daily as needed for dizziness. (Patient taking differently: Take 25 mg by mouth daily as needed for dizziness. ) 30 tablet 0  . metFORMIN (GLUCOPHAGE) 1000 MG tablet Take 1,000 mg by mouth 2 (two) times daily with a meal.    . Multiple Vitamin (MULTIVITAMIN) capsule Take 1 capsule by mouth daily.    [name] omeprazole (PRILOSEC) 40 MG capsule Take 1 capsule (40 mg total) by mouth daily as needed. 90 capsule 3  . Polyvinyl Alcohol-Povidone (REFRESH OP) Place 2 drops into both eyes daily as needed (for dry eyes).    . potassium chloride SA (K-DUR,KLOR-CON) 20 MEQ tablet TAKE ONE TABLET BY MOUTH DAILY 30 tablet 3  . rOPINIRole (REQUIP) 2 MG tablet Take 2 mg by mouth 3 (three) times daily. .    . rosuvastatin (CRESTOR) 5 MG tablet Take 5 mg by mouth daily.    [name] torsemide (DEMADEX) 20 MG tablet Take 20 mg by mouth daily.  5  . traMADol (ULTRAM) 50 MG tablet Take 100 mg by mouth 4 (four) times daily as needed.     . traZODone (DESYREL) 50 MG tablet Take 50 mg by mouth at bedtime.  5  . venlafaxine XR (EFFEXOR-XR) 150 MG 24 hr capsule TAKE TWO (2) CAPSULES BY MOUTH AT BEDTIME. (Patient taking differently: TAKE 300 MG BY MOUTH AT BEDTIME) 60 capsule 2   No current facility-administered medications for this visit.      Past Surgical History:  Procedure Laterality Date  . ABDOMINAL HYSTERECTOMY    . ARTHROSCOPY KNEE W/ DRILLING  06/2011   and Decemer of 2013.  [name] Carpel tunnel  1980's  . CATARACT EXTRACTION W/PHACO Right 03/21/2017   Procedure: CATARACT EXTRACTION PHACO AND INTRAOCULAR LENS PLACEMENT RIGHT EYE;  Surgeon: Baruch Goldmann, MD;  [facility];  Service: Ophthalmology;  Laterality: Right;  CDE: 2.91   . CATARACT EXTRACTION W/PHACO Left 04/18/2017   Procedure: CATARACT EXTRACTION PHACO AND INTRAOCULAR LENS PLACEMENT LEFT EYE;  Surgeon: Baruch Goldmann, MD;   Location: [facility];  Service: Ophthalmology;  Laterality: Left;  left  . CHOLECYSTECTOMY    . COLONOSCOPY WITH PROPOFOL N/A 10/04/2016   Procedure: COLONOSCOPY WITH PROPOFOL;  Surgeon: Rogene Houston, MD;  [facility];  Service: Endoscopy;  Laterality: N/A;  11:10  . CORONARY ARTERY BYPASS GRAFT N/A 08/29/2017   Procedure: CORONARY ARTERY BYPASS GRAFTING (CABG) x 3 WITH ENDOSCOPIC HARVESTING OF RIGHT SAPHENOUS VEIN;  Surgeon: Ivin Poot, MD;  [facility]  OR;  Service: Open Heart Surgery;  Laterality: N/A;  . ESOPHAGOGASTRODUODENOSCOPY N/A 04/25/2016   Procedure: ESOPHAGOGASTRODUODENOSCOPY (EGD);  Surgeon: Rogene Houston, MD;  [facility];  Service: Endoscopy;  Laterality: N/A;  730  . LEFT HEART CATH AND CORONARY ANGIOGRAPHY N/A 08/27/2017   Procedure: LEFT HEART CATH AND CORONARY ANGIOGRAPHY;  Surgeon: Sherren Mocha, MD;  Location: [facility];  Service: Cardiovascular::  pLAD 95% - p-mLAD 50%, ostD1 90% -pD1 80%; ostOM1 90%; rPDA 80%. EF ~50-55% - HK of dital Anterolateral & Apical wall.  - Rec CVTS c/s  . RECTAL SURGERY     fissure  . SHOULDER SURGERY Left    arthroscopy in March of this year  . TEE WITHOUT CARDIOVERSION N/A 08/29/2017   Procedure: TRANSESOPHAGEAL ECHOCARDIOGRAM (TEE);  Surgeon: Prescott Gum, Collier Salina, MD;  Location: [facility];  Service: Open Heart Surgery;  Laterality: N/A;  . TRANSTHORACIC ECHOCARDIOGRAM  06/06/2017   Mild to moderate reduced EF 40 and 45%.  Anterior septal, inferoseptal and basal to mid inferior hypokinesis.  GR 1 DD.  No significant valvular lesion     Allergies  Allergen Reactions  . Amoxicillin Anaphylaxis and Other (See Comments)    Has patient had a PCN reaction causing immediate rash, facial/tongue/throat swelling, SOB or lightheadedness with hypotension: Yes Has patient had a PCN reaction causing severe rash involving mucus membranes or skin necrosis: Yes Has patient had a PCN reaction that required hospitalization:  Yes Has patient had a PCN reaction occurring within the last 10 years: Yes If all of the above answers are "NO", then may proceed with Cephalosporin use.   [name] Hydrocodone Anaphylaxis  . Vancomycin Itching  . Levaquin [Levofloxacin] Rash  . Depacon [Valproic Acid] Other (See Comments)    Causes falls       Family History  Problem Relation Age of Onset  . Hypertension Mother   . Lymphoma Mother   . Depression Mother   . Arthritis Father   . Alcohol abuse Father   . Hypertension Sister   . Cancer Brother        kidney and lung  . Alcohol abuse Brother   . Alcohol abuse Paternal Uncle   . Alcohol abuse Paternal Grandfather   . Alcohol abuse Paternal Grandmother      Social History Ms. Rivers reports that she quit smoking about 46 years ago. Her smoking use included cigarettes. She has never used smokeless tobacco. Ms. Beidler reports that she does not drink alcohol.   Review of Systems CONSTITUTIONAL: No weight loss, fever, chills, weakness or fatigue.  HEENT: Eyes: No visual loss, blurred vision, double vision or yellow sclerae.No hearing loss, sneezing, congestion, runny nose or sore throat.  SKIN: No rash or itching.  CARDIOVASCULAR: per hpi RESPIRATORY: +SOB  GASTROINTESTINAL: No anorexia, nausea, vomiting or diarrhea. No abdominal pain or blood.  GENITOURINARY: No burning on urination, no polyuria NEUROLOGICAL: No headache, dizziness, syncope, paralysis, ataxia, numbness or tingling in the extremities. No change in bowel or bladder control.  MUSCULOSKELETAL: diffuse joint pains.  LYMPHATICS: No enlarged nodes. No history of splenectomy.  PSYCHIATRIC: No history of depression or anxiety.  ENDOCRINOLOGIC: No reports of sweating, cold or heat intolerance. No polyuria or polydipsia.  [name]   Physical Examination Vitals:   01/20/18 1129  BP: 128/76  Pulse: 86  SpO2: 99%   Vitals:   01/20/18 1129  Weight: 271 lb (122.9 kg)  Height: 5\' 4"  (1.626 m)    Gen: resting  comfortably,  no acute distress HEENT: no scleral icterus, pupils equal round and reactive, no palptable cervical adenopathy,  CV: RRR, no m/r/g, no jvd Resp: Clear to auscultation bilaterally GI: abdomen is soft, non-tender, non-distended, normal bowel sounds, no hepatosplenomegaly MSK: extremities are warm, no edema.  Skin: warm, no rash Neuro:  no focal deficits Psych: appropriate affect   Diagnostic Studies 03/2016 Lexiscan  Probable normal perfusion and mild soft tissue attenuation (breast) No significant ischemia or evidence for scar  The left ventricular ejection fraction is normal (55-65%).  Low risk scan.  08/2017 cath  RPDA lesion is 80% stenosed.  Ost 1st Mrg to 1st Mrg lesion is 90% stenosed.  Prox LAD lesion is 95% stenosed.  Prox LAD to Mid LAD lesion is 50% stenosed.  Ost 1st Diag lesion is 90% stenosed.  1st Diag lesion is 80% stenosed.  The left ventricular ejection fraction is 50-55% by visual estimate.  There is mild left ventricular systolic dysfunction.  1. Severe multivessel coronary artery disease with severe stenosis of the proximal LAD/first diagonal bifurcation, first obtuse marginal Tyree Vandruff of the circumflex, and PDA Dontaye Hur of the RCA. 2. Mild segmental LV systolic dysfunction with hypokinesis of the distal anterolateral and apical walls, LVEF estimated at 50 to 55%.  08/2017 TEE  Aortic valve: The valve is trileaflet. No stenosis. No regurgitation.  Mitral valve: Mild regurgitation. Central jet.  Right ventricle: Normal cavity size, wall thickness and ejection fraction.  Pericardium: Trivial pericardial effusion.  Tricuspid valve: No regurgitation  Pulmonic valve: No regurgitation by color doppler.  Left ventricle: Regional wall motion abnormalities present, dyskinetic basal and apical septal wall, hypokinesis of lateral wall and inferoseptal wall. Increased wall thickness. LVEF 35-40%.  No ASD/PFO present  Unable to rule out  thrombus in LAA but velocities make thrombus less likely.  05/2017 echo Study Conclusions  - Left ventricle: The cavity size was normal. There was mild concentric hypertrophy. Systolic function was mildly to moderately reduced. The estimated ejection fraction was in the range of 40% to 45%. Hypokinesis of the anteroseptal, inferoseptal and basal to mid inferior myocardium. Doppler parameters are consistent with abnormal left ventricular relaxation (grade 1 diastolic dysfunction). Doppler parameters are consistent with indeterminate ventricular filling pressure. - Aortic valve: Transvalvular velocity was within the normal range. There was no stenosis. There was no regurgitation. - Mitral valve: Transvalvular velocity was within the normal range. There was no evidence for stenosis. There was no regurgitation. - Right ventricle: The cavity size was normal. Wall thickness was normal. Systolic function was normal. - Tricuspid valve: There was trivial regurgitation. - Global longitudinal strain -11.9%.     Assessment and Plan  1.CAD - no recent chest pain - beta blocker stopped due to fatigue.  - continue current meds, but change ASA to 81mg  daily.    2. Postoperative afib - amio stopped, no recent palpitations. EKG today shows normal sinus rhythm   3. Acute on chronic systolic HF - significant weight gain since last visit. Exam is limited by body habitus, she does have 1+bilateral LE edema - repeat echo now that she is s/p CABG and has been on medical therapy, previously had mild systolic dysfunction - take torsemide 40mg  daily x 3 days, then 20mg  daily. She is due to for labs next week with Dr Dorris Fetch - she will update [place] on her weights next week  4. Hyperlipidemia - chronic leg pains worst on crestor, will try pravastatin 20mg  daily  F/u 4-6 weeks   Arnoldo Lenis,  M.D. 

## 2018-01-20 NOTE — Patient Instructions (Signed)
Medication Instructions:  Stop crestor   Decrease aspirin to 81 mg daily  Start pravastatin 20 mg daily   Take torsemide 40 mg daily for 3 days - then go back to 20 mg daily   Labwork: none  Testing/Procedures: Your physician has requested that you have an echocardiogram. Echocardiography is a painless test that uses sound waves to create images of your heart. It provides your doctor with information about the size and shape of your heart and how well your heart's chambers and valves are working. This procedure takes approximately one hour. There are no restrictions for this procedure.    Follow-Up: Your physician recommends that you schedule a follow-up appointment in: 4-6 weeks    Any Other Special Instructions Will Be Listed Below (If Applicable).  Please call in 1 week with an update on weights    If you need a refill on your cardiac medications before your next appointment, please call your pharmacy.

## 2018-01-21 DIAGNOSIS — J449 Chronic obstructive pulmonary disease, unspecified: Secondary | ICD-10-CM | POA: Diagnosis not present

## 2018-01-21 DIAGNOSIS — G4730 Sleep apnea, unspecified: Secondary | ICD-10-CM | POA: Diagnosis not present

## 2018-01-26 ENCOUNTER — Ambulatory Visit (HOSPITAL_COMMUNITY): Admission: RE | Admit: 2018-01-26 | Payer: PPO | Source: Ambulatory Visit

## 2018-01-27 DIAGNOSIS — M79676 Pain in unspecified toe(s): Secondary | ICD-10-CM | POA: Diagnosis not present

## 2018-01-27 DIAGNOSIS — L84 Corns and callosities: Secondary | ICD-10-CM | POA: Diagnosis not present

## 2018-01-27 DIAGNOSIS — B351 Tinea unguium: Secondary | ICD-10-CM | POA: Diagnosis not present

## 2018-01-27 DIAGNOSIS — E1142 Type 2 diabetes mellitus with diabetic polyneuropathy: Secondary | ICD-10-CM | POA: Diagnosis not present

## 2018-01-28 ENCOUNTER — Ambulatory Visit (INDEPENDENT_AMBULATORY_CARE_PROVIDER_SITE_OTHER): Payer: PPO | Admitting: Orthopaedic Surgery

## 2018-01-28 ENCOUNTER — Encounter (INDEPENDENT_AMBULATORY_CARE_PROVIDER_SITE_OTHER): Payer: Self-pay | Admitting: Orthopaedic Surgery

## 2018-01-28 VITALS — BP 107/57 | HR 94 | Ht 64.0 in | Wt 271.0 lb

## 2018-01-28 DIAGNOSIS — G8929 Other chronic pain: Secondary | ICD-10-CM | POA: Diagnosis not present

## 2018-01-28 DIAGNOSIS — M25562 Pain in left knee: Secondary | ICD-10-CM

## 2018-01-28 DIAGNOSIS — M25561 Pain in right knee: Secondary | ICD-10-CM | POA: Diagnosis not present

## 2018-01-28 MED ORDER — LIDOCAINE HCL 1 % IJ SOLN
2.0000 mL | INTRAMUSCULAR | Status: AC | PRN
  Administered 2018-01-28: 2 mL

## 2018-01-28 MED ORDER — METHYLPREDNISOLONE ACETATE 40 MG/ML IJ SUSP
80.0000 mg | INTRAMUSCULAR | Status: AC | PRN
  Administered 2018-01-28: 80 mg

## 2018-01-28 MED ORDER — BUPIVACAINE HCL 0.5 % IJ SOLN
2.0000 mL | INTRAMUSCULAR | Status: AC | PRN
  Administered 2018-01-28: 2 mL via INTRA_ARTICULAR

## 2018-01-28 NOTE — Progress Notes (Signed)
Office Visit Note   Patient: Norma [name]           Date of Birth: 05-19-1950           MRN: [id number] Visit Date: 01/28/2018              Requested by: Sinda Du, [address] [place], [place] [address] PCP: Sinda Du, MD   Assessment & Plan: Visit Diagnoses:  1. Chronic pain of both knees     Plan: Bilateral knee osteoarthritis.  Successful cortisone injection right knee last week.  Will inject right knee today  Follow-Up Instructions: Return if symptoms worsen or fail to improve.   Orders:  Orders Placed This Encounter  Procedures  . Large Joint Inj: L knee   No orders of the defined types were placed in this encounter.     Procedures: Large Joint Inj: L knee on 01/28/2018 1:20 PM Indications: pain and diagnostic evaluation Details: 25 G 1.5 in needle, anteromedial approach  Arthrogram: No  Medications: 2 mL lidocaine 1 %; 2 mL bupivacaine 0.5 %; 80 mg methylPREDNISolone acetate 40 MG/ML Procedure, treatment alternatives, risks and benefits explained, specific risks discussed. Consent was given by the patient. Patient was prepped and draped in the usual sterile fashion.       Clinical Data: No additional findings.   Subjective: Chief Complaint  Patient presents with  . Follow-up    L KNEE INJECTION HAD R KNEE INJECTION 3 WEEKS AGO    HPI  Review of Systems  Constitutional: Negative for fatigue and fever.  HENT: Negative for ear pain.   Eyes: Negative for pain.  Respiratory: Positive for shortness of breath. Negative for cough.   Cardiovascular: Positive for leg swelling.  Gastrointestinal: Negative for constipation and diarrhea.  Genitourinary: Negative for difficulty urinating.  Musculoskeletal: Positive for back pain and neck pain.  Skin: Negative for rash.  Allergic/Immunologic: Negative for food allergies.  Neurological: Positive for weakness and numbness.  Hematological: Does not bruise/bleed easily.    Psychiatric/Behavioral: Negative for sleep disturbance.     Objective: Vital Signs: BP (!) 107/57 (BP Location: Right Arm, Patient Position: Sitting, Cuff Size: Normal)   Pulse 94   Ht 5\' 4"  (1.626 m)   Wt 271 lb (122.9 kg)   BMI 46.52 kg/m   Physical Exam  Ortho Exam large knees.  No discomfort referable to the right knee.  Moderate medial joint pain left knee.  Large knees but probably has a small effusion left knee.  Positive patellar crepitation.  Full extension flexion about 9095 degrees  Specialty Comments:  No specialty comments available.  Imaging: No results found.   PMFS History: Patient Active Problem List   Diagnosis Date Noted  . Mixed hyperlipidemia 09/11/2017  . Pressure injury of skin 09/02/2017  . Coronary artery disease involving native heart without angina pectoris   . S/P CABG x 3   . Leukocytosis   . Acute blood loss anemia   . Post-operative pain   . Bipolar affective disorder ([place])   . Diabetes mellitus type 2 in obese ([place])   . History of CVA (cerebrovascular accident)   . Hypokalemia   . Hx of CABG 08/29/2017  . Coronary artery disease involving native coronary artery of native heart with unstable angina pectoris ([place]) 08/28/2017  . Unstable angina ([place]) 08/27/2017  . Cardiomyopathy ([place]) 08/22/2017  . Stroke (cerebrum) ([place]) 05/16/2017  . Low hemoglobin 09/19/2016  . Dizziness 06/21/2016  . Chest pain  with moderate risk for cardiac etiology 04/23/2016  . Bipolar I disorder, most recent episode depressed ([place]) 06/05/2015  . Uncontrolled type 2 diabetes mellitus with complication, with long-term current use of insulin ([place]) 09/20/2014  . Chronic low back pain 09/20/2014  . Morbid obesity, unspecified obesity type ([place]) 09/20/2014  . Memory difficulty 09/20/2014  . Lumbosacral spondylosis without myelopathy 12/25/2012  . Gastroparesis 11/04/2012  . Nausea alone 11/04/2012  . Restless legs syndrome (RLS) 09/17/2012  . Headache(784.0)  03/19/2012  . GERD (gastroesophageal reflux disease) 02/16/2012  . Abdominal pain 02/16/2012  . Hypothyroidism 02/16/2012  . Tremor 02/16/2012  . Hyperlipidemia associated with type 2 diabetes mellitus ([place]) 04/01/2008  . Obstructive sleep apnea 04/01/2008  . Essential hypertension, benign 04/01/2008  . LBBB (left bundle branch block) 04/01/2008   Past Medical History:  Diagnosis Date  . Anemia   . Anxiety   . Bipolar disorder ([place])   . Bulging lumbar disc    L3-4  . Chronic daily headache   . Chronic low back pain 09/20/2014  . COPD (chronic obstructive pulmonary disease) ([place])   . Degenerative arthritis   . Depression   . Diabetes mellitus, type II ([place])   . DM type 2 with diabetic peripheral neuropathy ([place]) 09/20/2014  . Dyslipidemia   . Dyspnea   . Gastroparesis   . GERD (gastroesophageal reflux disease)   . Heart murmur   . History of hiatal hernia   . Hypothyroidism   . IBS (irritable bowel syndrome)   . Memory difficulty 09/20/2014  . Morbid obesity ([place])   . Neuropathy   . Obstructive sleep apnea on CPAP   . Restless legs syndrome (RLS) 09/17/2012  . Stroke (cerebrum) ([place]) 05/16/2017   Left parietal    Family History  Problem Relation Age of Onset  . Hypertension Mother   . Lymphoma Mother   . Depression Mother   . Arthritis Father   . Alcohol abuse Father   . Hypertension Sister   . Cancer Brother        kidney and lung  . Alcohol abuse Brother   . Alcohol abuse Paternal Uncle   . Alcohol abuse Paternal Grandfather   . Alcohol abuse Paternal Grandmother     Past Surgical History:  Procedure Laterality Date  . ABDOMINAL HYSTERECTOMY    . ARTHROSCOPY KNEE W/ DRILLING  06/2011   and Decemer of 2013.  [name] Carpel tunnel  1980's  . CATARACT EXTRACTION W/PHACO Right 03/21/2017   Procedure: CATARACT EXTRACTION PHACO AND INTRAOCULAR LENS PLACEMENT RIGHT EYE;  Surgeon: Baruch Goldmann, MD;  [facility];  Service: Ophthalmology;  Laterality: Right;  CDE: 2.91     . CATARACT EXTRACTION W/PHACO Left 04/18/2017   Procedure: CATARACT EXTRACTION PHACO AND INTRAOCULAR LENS PLACEMENT LEFT EYE;  Surgeon: Baruch Goldmann, MD;  [facility];  Service: Ophthalmology;  Laterality: Left;  left  . CHOLECYSTECTOMY    . COLONOSCOPY WITH PROPOFOL N/A 10/04/2016   Procedure: COLONOSCOPY WITH PROPOFOL;  Surgeon: Rogene Houston, MD;  [facility];  Service: Endoscopy;  Laterality: N/A;  11:10  . CORONARY ARTERY BYPASS GRAFT N/A 08/29/2017   Procedure: CORONARY ARTERY BYPASS GRAFTING (CABG) x 3 WITH ENDOSCOPIC HARVESTING OF RIGHT SAPHENOUS VEIN;  Surgeon: Ivin Poot, MD;  Location: [facility];  Service: Open Heart Surgery;  Laterality: N/A;  . ESOPHAGOGASTRODUODENOSCOPY N/A 04/25/2016   Procedure: ESOPHAGOGASTRODUODENOSCOPY (EGD);  Surgeon: Rogene Houston, MD;  [facility];  Service: Endoscopy;  Laterality: N/A;  730  . LEFT HEART CATH AND CORONARY ANGIOGRAPHY N/A 08/27/2017   Procedure: LEFT HEART CATH AND CORONARY ANGIOGRAPHY;  Surgeon: Sherren Mocha, MD;  Location: [facility];  Service: Cardiovascular::  pLAD 95% - p-mLAD 50%, ostD1 90% -pD1 80%; ostOM1 90%; rPDA 80%. EF ~50-55% - HK of dital Anterolateral & Apical wall.  - Rec CVTS c/s  . RECTAL SURGERY     fissure  . SHOULDER SURGERY Left    arthroscopy in March of this year  . TEE WITHOUT CARDIOVERSION N/A 08/29/2017   Procedure: TRANSESOPHAGEAL ECHOCARDIOGRAM (TEE);  Surgeon: Prescott Gum, Collier Salina, MD;  Location: [facility];  Service: Open Heart Surgery;  Laterality: N/A;  . TRANSTHORACIC ECHOCARDIOGRAM  06/06/2017   Mild to moderate reduced EF 40 and 45%.  Anterior septal, inferoseptal and basal to mid inferior hypokinesis.  GR 1 DD.  No significant valvular lesion   Social History   Occupational History    Employer: [employer]  Tobacco Use  . Smoking status: Former Smoker    Types: Cigarettes    Last attempt to quit: 02/04/1971    Years since quitting: 47.0  . Smokeless  tobacco: Never Used  . Tobacco comment: smoked 2 cigarettes a day  Substance and Sexual Activity  . Alcohol use: No    Alcohol/week: 0.0 standard drinks  . Drug use: No  . Sexual activity: Never

## 2018-01-29 ENCOUNTER — Other Ambulatory Visit (INDEPENDENT_AMBULATORY_CARE_PROVIDER_SITE_OTHER): Payer: Self-pay | Admitting: *Deleted

## 2018-01-29 DIAGNOSIS — D508 Other iron deficiency anemias: Secondary | ICD-10-CM

## 2018-01-29 DIAGNOSIS — E038 Other specified hypothyroidism: Secondary | ICD-10-CM | POA: Diagnosis not present

## 2018-01-29 DIAGNOSIS — Z794 Long term (current) use of insulin: Secondary | ICD-10-CM | POA: Diagnosis not present

## 2018-01-29 DIAGNOSIS — E118 Type 2 diabetes mellitus with unspecified complications: Secondary | ICD-10-CM | POA: Diagnosis not present

## 2018-01-29 DIAGNOSIS — E1165 Type 2 diabetes mellitus with hyperglycemia: Secondary | ICD-10-CM | POA: Diagnosis not present

## 2018-01-29 LAB — HEMOGLOBIN AND HEMATOCRIT, BLOOD
HEMATOCRIT: 34.6 % — AB (ref 35.0–45.0)
Hemoglobin: 11.2 g/dL — ABNORMAL LOW (ref 11.7–15.5)

## 2018-01-30 ENCOUNTER — Telehealth: Payer: Self-pay | Admitting: Cardiology

## 2018-01-30 LAB — COMPLETE METABOLIC PANEL WITH GFR
AG RATIO: 1.2 (calc) (ref 1.0–2.5)
ALBUMIN MSPROF: 3.9 g/dL (ref 3.6–5.1)
ALKALINE PHOSPHATASE (APISO): 59 U/L (ref 33–130)
ALT: 12 U/L (ref 6–29)
AST: 11 U/L (ref 10–35)
BILIRUBIN TOTAL: 0.2 mg/dL (ref 0.2–1.2)
BUN / CREAT RATIO: 18 (calc) (ref 6–22)
BUN: 20 mg/dL (ref 7–25)
CHLORIDE: 91 mmol/L — AB (ref 98–110)
CO2: 33 mmol/L — ABNORMAL HIGH (ref 20–32)
Calcium: 8.5 mg/dL — ABNORMAL LOW (ref 8.6–10.4)
Creat: 1.1 mg/dL — ABNORMAL HIGH (ref 0.50–0.99)
GFR, Est African American: 61 mL/min/{1.73_m2} (ref >=60)
GFR, Est Non African American: 53 mL/min/{1.73_m2} — ABNORMAL LOW (ref >=60)
GLOBULIN: 3.2 g/dL (ref 1.9–3.7)
Glucose, Bld: 163 mg/dL — ABNORMAL HIGH (ref 65–99)
POTASSIUM: 4.2 mmol/L (ref 3.5–5.3)
SODIUM: 137 mmol/L (ref 135–146)
Total Protein: 7.1 g/dL (ref 6.1–8.1)

## 2018-01-30 LAB — MICROALBUMIN / CREATININE URINE RATIO
Creatinine, Urine: 101 mg/dL (ref 20–275)
Microalb Creat Ratio: 9 mcg/mg creat (ref <30)
Microalb, Ur: 0.9 mg/dL

## 2018-01-30 LAB — T4, FREE: FREE T4: 1.2 ng/dL (ref 0.8–1.8)

## 2018-01-30 LAB — TSH: TSH: 2.16 mIU/L (ref 0.40–4.50)

## 2018-01-30 LAB — HEMOGLOBIN A1C
HEMOGLOBIN A1C: 7.7 %{Hb} — AB (ref <5.7)
MEAN PLASMA GLUCOSE: 174 (calc)
eAG (mmol/L): 9.7 (calc)

## 2018-01-30 NOTE — Telephone Encounter (Signed)
Pt called to update on weights. She is down to 261 lbs. She is feeling well over all. Will send to Dr. Harl Bowie as an [place]

## 2018-01-30 NOTE — Telephone Encounter (Signed)
Pt is taking torsemide 20 mg daily. She is aware she may increase per weight gain.

## 2018-01-30 NOTE — Telephone Encounter (Signed)
Patient states that Dr.Branch said for her to call office back with "update". / tg

## 2018-01-30 NOTE — Telephone Encounter (Signed)
Weights are back down to what she was around July at her clinic visit. Labs show just mild stress on the kidneys, that should resolve now that she is back on her prior dose of torsemide, verify she is taking torsemide just 20mg  daily. If significant weight gain to 265 or higher or significant edema ok to take torsemide 40mg  for 2-3 days.   Zandra Abts MD

## 2018-02-02 ENCOUNTER — Ambulatory Visit (HOSPITAL_COMMUNITY): Payer: PPO

## 2018-02-03 ENCOUNTER — Ambulatory Visit (HOSPITAL_COMMUNITY)
Admission: RE | Admit: 2018-02-03 | Discharge: 2018-02-03 | Disposition: A | Discharge status: Home / Self Care | Payer: PPO | Source: Ambulatory Visit | Attending: Cardiology | Admitting: Cardiology

## 2018-02-03 ENCOUNTER — Encounter: Payer: Self-pay | Admitting: "Endocrinology

## 2018-02-03 ENCOUNTER — Ambulatory Visit (INDEPENDENT_AMBULATORY_CARE_PROVIDER_SITE_OTHER): Payer: PPO | Admitting: "Endocrinology

## 2018-02-03 VITALS — BP 150/68 | HR 97 | Ht 64.0 in | Wt 270.0 lb

## 2018-02-03 DIAGNOSIS — E1159 Type 2 diabetes mellitus with other circulatory complications: Secondary | ICD-10-CM

## 2018-02-03 DIAGNOSIS — I10 Essential (primary) hypertension: Secondary | ICD-10-CM

## 2018-02-03 DIAGNOSIS — E782 Mixed hyperlipidemia: Secondary | ICD-10-CM

## 2018-02-03 DIAGNOSIS — Z951 Presence of aortocoronary bypass graft: Secondary | ICD-10-CM | POA: Diagnosis not present

## 2018-02-03 DIAGNOSIS — E038 Other specified hypothyroidism: Secondary | ICD-10-CM

## 2018-02-03 DIAGNOSIS — Z8673 Personal history of transient ischemic attack (TIA), and cerebral infarction without residual deficits: Secondary | ICD-10-CM | POA: Diagnosis not present

## 2018-02-03 DIAGNOSIS — I447 Left bundle-branch block, unspecified: Secondary | ICD-10-CM | POA: Insufficient documentation

## 2018-02-03 DIAGNOSIS — E119 Type 2 diabetes mellitus without complications: Secondary | ICD-10-CM | POA: Diagnosis not present

## 2018-02-03 DIAGNOSIS — E785 Hyperlipidemia, unspecified: Secondary | ICD-10-CM | POA: Insufficient documentation

## 2018-02-03 DIAGNOSIS — I2510 Atherosclerotic heart disease of native coronary artery without angina pectoris: Secondary | ICD-10-CM | POA: Diagnosis not present

## 2018-02-03 DIAGNOSIS — Z87891 Personal history of nicotine dependence: Secondary | ICD-10-CM | POA: Diagnosis not present

## 2018-02-03 DIAGNOSIS — R0602 Shortness of breath: Secondary | ICD-10-CM | POA: Diagnosis not present

## 2018-02-03 LAB — ECHOCARDIOGRAM COMPLETE
Height: 64 in
WEIGHTICAEL: 4320 [oz_av]

## 2018-02-03 MED ORDER — INSULIN GLARGINE 300 UNIT/ML ~~LOC~~ SOPN: 50.0000 [IU] | PEN_INJECTOR | Freq: Every day | SUBCUTANEOUS | 2 refills | Status: DC

## 2018-02-03 NOTE — Progress Notes (Signed)
*  PRELIMINARY RESULTS* Echocardiogram 2D Echocardiogram has been performed.  Leavy Cella 02/03/2018, 2:01 PM

## 2018-02-03 NOTE — Patient Instructions (Signed)

## 2018-02-03 NOTE — Progress Notes (Signed)
Endocrinology follow-up note   Subjective:    Patient ID: Norma [name], female    DOB: 1950-05-22,    Past Medical History:  Diagnosis Date  . Anemia   . Anxiety   . Bipolar disorder ([place])   . Bulging lumbar disc    L3-4  . Chronic daily headache   . Chronic low back pain 09/20/2014  . COPD (chronic obstructive pulmonary disease) ([place])   . Degenerative arthritis   . Depression   . Diabetes mellitus, type II ([place])   . DM type 2 with diabetic peripheral neuropathy ([place]) 09/20/2014  . Dyslipidemia   . Dyspnea   . Gastroparesis   . GERD (gastroesophageal reflux disease)   . Heart murmur   . History of hiatal hernia   . Hypothyroidism   . IBS (irritable bowel syndrome)   . Memory difficulty 09/20/2014  . Morbid obesity ([place])   . Neuropathy   . Obstructive sleep apnea on CPAP   . Restless legs syndrome (RLS) 09/17/2012  . Stroke (cerebrum) ([place]) 05/16/2017   Left parietal   Past Surgical History:  Procedure Laterality Date  . ABDOMINAL HYSTERECTOMY    . ARTHROSCOPY KNEE W/ DRILLING  06/2011   and Decemer of 2013.  [name] Carpel tunnel  1980's  . CATARACT EXTRACTION W/PHACO Right 03/21/2017   Procedure: CATARACT EXTRACTION PHACO AND INTRAOCULAR LENS PLACEMENT RIGHT EYE;  Surgeon: Baruch Goldmann, MD;  Location: [facility];  Service: Ophthalmology;  Laterality: Right;  CDE: 2.91   . CATARACT EXTRACTION W/PHACO Left 04/18/2017   Procedure: CATARACT EXTRACTION PHACO AND INTRAOCULAR LENS PLACEMENT LEFT EYE;  Surgeon: Baruch Goldmann, MD;  Location: [facility];  Service: Ophthalmology;  Laterality: Left;  left  . CHOLECYSTECTOMY    . COLONOSCOPY WITH PROPOFOL N/A 10/04/2016   Procedure: COLONOSCOPY WITH PROPOFOL;  Surgeon: Rogene Houston, MD;  [facility];  Service: Endoscopy;  Laterality: N/A;  11:10  . CORONARY ARTERY BYPASS GRAFT N/A 08/29/2017   Procedure: CORONARY ARTERY BYPASS GRAFTING (CABG) x 3 WITH ENDOSCOPIC HARVESTING OF RIGHT SAPHENOUS VEIN;  Surgeon: Ivin Poot, MD;   Location: [facility];  Service: Open Heart Surgery;  Laterality: N/A;  . ESOPHAGOGASTRODUODENOSCOPY N/A 04/25/2016   Procedure: ESOPHAGOGASTRODUODENOSCOPY (EGD);  Surgeon: Rogene Houston, MD;  [facility];  Service: Endoscopy;  Laterality: N/A;  730  . LEFT HEART CATH AND CORONARY ANGIOGRAPHY N/A 08/27/2017   Procedure: LEFT HEART CATH AND CORONARY ANGIOGRAPHY;  Surgeon: Sherren Mocha, MD;  Location: [facility];  Service: Cardiovascular::  pLAD 95% - p-mLAD 50%, ostD1 90% -pD1 80%; ostOM1 90%; rPDA 80%. EF ~50-55% - HK of dital Anterolateral & Apical wall.  - Rec CVTS c/s  . RECTAL SURGERY     fissure  . SHOULDER SURGERY Left    arthroscopy in March of this year  . TEE WITHOUT CARDIOVERSION N/A 08/29/2017   Procedure: TRANSESOPHAGEAL ECHOCARDIOGRAM (TEE);  Surgeon: Prescott Gum, Collier Salina, MD;  Location: [facility];  Service: Open Heart Surgery;  Laterality: N/A;  . TRANSTHORACIC ECHOCARDIOGRAM  06/06/2017   Mild to moderate reduced EF 40 and 45%.  Anterior septal, inferoseptal and basal to mid inferior hypokinesis.  GR 1 DD.  No significant valvular lesion   Social History   Socioeconomic History  . Marital status: Single    Spouse name: Not on file  . Number of children: 0  . Years of education: 15  . Highest education level: Not on file  Occupational History  Employer: [employer]  Social Needs  . Financial resource strain: Not on file  . Food insecurity:    Worry: Not on file    Inability: Not on file  . Transportation needs:    Medical: Not on file    Non-medical: Not on file  Tobacco Use  . Smoking status: Former Smoker    Types: Cigarettes    Last attempt to quit: 02/04/1971    Years since quitting: 47.0  . Smokeless tobacco: Never Used  . Tobacco comment: smoked 2 cigarettes a day  Substance and Sexual Activity  . Alcohol use: No    Alcohol/week: 0.0 standard drinks  . Drug use: No  . Sexual activity: Never  Lifestyle  . Physical activity:    Days  per week: Not on file    Minutes per session: Not on file  . Stress: Not on file  Relationships  . Social connections:    Talks on phone: Not on file    Gets together: Not on file    Attends religious service: Not on file    Active member of club or organization: Not on file    Attends meetings of clubs or organizations: Not on file    Relationship status: Not on file  Other Topics Concern  . Not on file  Social History Narrative   Patient lives at home alone.    Patient has no children.    Patient has her masters in nursing.    Patient is single.    Patient drinks about 2 glasses of tea daily.   Patient is right handed.   Outpatient Encounter Medications as of 02/03/2018  Medication Sig  . albuterol (PROVENTIL HFA;VENTOLIN HFA) 108 (90 Base) MCG/ACT inhaler Inhale 2 puffs into the lungs every 6 (six) hours as needed for wheezing or shortness of breath.  [name] aspirin EC 81 MG tablet Take 1 tablet (81 mg total) by mouth daily.  . baclofen (LIORESAL) 10 MG tablet Take 1 tablet (10 mg total) by mouth at bedtime.  [name] buPROPion (WELLBUTRIN XL) 300 MG 24 hr tablet TAKE ONE TABLET BY MOUTH EVERY MORNING.  . calcium carbonate (TUMS - DOSED IN MG ELEMENTAL CALCIUM) 500 MG chewable tablet Chew 2 tablets by mouth as needed for indigestion or heartburn.   . carvedilol (COREG) 6.25 MG tablet Take 1 tablet (6.25 mg total) by mouth 2 (two) times daily.  [name] EPINEPHrine (EPIPEN 2-PAK) 0.3 mg/0.3 mL IJ SOAJ injection Inject 0.3 mg into the muscle once.  . gabapentin (NEURONTIN) 300 MG capsule Take 1 capsule (300 mg total) by mouth 2 (two) times daily.  . hydrOXYzine (ATARAX/VISTARIL) 10 MG tablet Take 1 tablet (10 mg total) by mouth 3 (three) times daily as needed for itching.  . Insulin Glargine (TOUJEO SOLOSTAR) 300 UNIT/ML SOPN Inject 50 Units into the skin at bedtime.  . Lactase (LACTAID FAST ACT) 9000 units TABS Take by mouth. Patient states that she takes as needed.  . lamoTRIgine (LAMICTAL) 150 MG  tablet TAKE ONE TABLET BY MOUTH AT BEDTIME.  [name] levothyroxine (SYNTHROID, LEVOTHROID) 100 MCG tablet Take 100 mcg by mouth daily before breakfast.   . losartan (COZAAR) 25 MG tablet Take 1 tablet (25 mg total) by mouth daily.  . meclizine (ANTIVERT) 25 MG tablet Take 1 tablet (25 mg total) by mouth 3 (three) times daily as needed for dizziness. (Patient taking differently: Take 25 mg by mouth daily as needed for dizziness. )  . metFORMIN (GLUCOPHAGE) 1000 MG tablet  Take 1,000 mg by mouth 2 (two) times daily with a meal.  . Multiple Vitamin (MULTIVITAMIN) capsule Take 1 capsule by mouth daily.  [name] omeprazole (PRILOSEC) 40 MG capsule Take 1 capsule (40 mg total) by mouth daily as needed.  . Polyvinyl Alcohol-Povidone (REFRESH OP) Place 2 drops into both eyes daily as needed (for dry eyes).  . potassium chloride SA (K-DUR,KLOR-CON) 20 MEQ tablet TAKE ONE TABLET BY MOUTH DAILY  . pravastatin (PRAVACHOL) 20 MG tablet Take 1 tablet (20 mg total) by mouth every evening.  [name] rOPINIRole (REQUIP) 2 MG tablet Take 2 mg by mouth 3 (three) times daily. .  . torsemide (DEMADEX) 20 MG tablet Take 20 mg by mouth daily.  . traMADol (ULTRAM) 50 MG tablet Take 100 mg by mouth 4 (four) times daily as needed.   . traZODone (DESYREL) 50 MG tablet Take 50 mg by mouth at bedtime.  [name] venlafaxine XR (EFFEXOR-XR) 150 MG 24 hr capsule TAKE TWO (2) CAPSULES BY MOUTH AT BEDTIME. (Patient taking differently: TAKE 300 MG BY MOUTH AT BEDTIME)  . [DISCONTINUED] Insulin Glargine (TOUJEO SOLOSTAR) 300 UNIT/ML SOPN Inject 40 Units into the skin at bedtime.   No facility-administered encounter medications on file as of 02/03/2018.    ALLERGIES: Allergies  Allergen Reactions  . Amoxicillin Anaphylaxis and Other (See Comments)    Has patient had a PCN reaction causing immediate rash, facial/tongue/throat swelling, SOB or lightheadedness with hypotension: Yes Has patient had a PCN reaction causing severe rash involving mucus membranes  or skin necrosis: Yes Has patient had a PCN reaction that required hospitalization: Yes Has patient had a PCN reaction occurring within the last 10 years: Yes If all of the above answers are "NO", then may proceed with Cephalosporin use.   [name] Hydrocodone Anaphylaxis  . Vancomycin Itching  . Levaquin [Levofloxacin] Rash  . Depacon [Valproic Acid] Other (See Comments)    Causes falls    VACCINATION STATUS: Immunization History  Administered Date(s) Administered  . Influenza,inj,Quad PF,6+ Mos 01/20/2018    Diabetes  She presents for her follow-up diabetic visit. She has type 2 diabetes mellitus. Onset time: She was diagnosed at approximate age of 50 years. Her disease course has been worsening. There are no hypoglycemic associated symptoms. Pertinent negatives for hypoglycemia include no confusion, headaches, pallor or seizures. Associated symptoms include fatigue. Pertinent negatives for diabetes include no chest pain, no polydipsia, no polyphagia and no polyuria. There are no hypoglycemic complications. Symptoms are worsening. Diabetic complications include heart disease. (Since her last visit, she underwent coronary artery bypass graft for 3 coronary vessels.) Risk factors for coronary artery disease include diabetes mellitus, dyslipidemia, hypertension, obesity and sedentary lifestyle. Current diabetic treatment includes oral agent (dual therapy) and insulin injections. She is compliant with treatment most of the time. Her weight is stable. She is following a generally unhealthy diet. She has not had a previous visit with a dietitian. She never participates in exercise. Her breakfast blood glucose range is generally 140-180 mg/dl. Her overall blood glucose range is 140-180 mg/dl. An ACE inhibitor/angiotensin II receptor blocker is being taken. Eye exam is current.  Hypertension  This is a chronic problem. The current episode started more than 1 year ago. Pertinent negatives include no chest  pain, headaches, palpitations or shortness of breath. Past treatments include ACE inhibitors. Compliance problems include diet, exercise, medication cost, medication side effects and psychosocial issues.  Identifiable causes of hypertension include a thyroid problem.  Hyperlipidemia  This is a chronic problem.  The current episode started more than 1 year ago. Pertinent negatives include no chest pain, myalgias or shortness of breath. Current antihyperlipidemic treatment includes statins. Risk factors for coronary artery disease include a sedentary lifestyle, obesity, hypertension, diabetes mellitus and dyslipidemia.  Thyroid Problem  Presents for follow-up visit. Symptoms include fatigue. Patient reports no cold intolerance, diarrhea, heat intolerance or palpitations. The symptoms have been stable. Past treatments include levothyroxine. Her past medical history is significant for hyperlipidemia.     Review of Systems  Constitutional: Positive for fatigue. Negative for unexpected weight change.  HENT: Negative for trouble swallowing and voice change.   Eyes: Negative for visual disturbance.  Respiratory: Negative for cough, shortness of breath and wheezing.   Cardiovascular: Negative for chest pain, palpitations and leg swelling.  Gastrointestinal: Negative for diarrhea, nausea and vomiting.  Endocrine: Negative for cold intolerance, heat intolerance, polydipsia, polyphagia and polyuria.  Musculoskeletal: Negative for arthralgias and myalgias.  Skin: Negative for color change, pallor, rash and wound.  Neurological: Negative for seizures and headaches.  Psychiatric/Behavioral: Negative for confusion and suicidal ideas.    Objective:    BP (!) 150/68   Pulse 97   Ht 5\' 4"  (1.626 m)   Wt 270 lb (122.5 kg)   BMI 46.35 kg/m   Wt Readings from Last 3 Encounters:  02/03/18 270 lb (122.5 kg)  01/28/18 271 lb (122.9 kg)  01/20/18 271 lb (122.9 kg)    Physical Exam  Constitutional: She is  oriented to person, place, and time. She appears well-developed.  HENT:  Head: Normocephalic and atraumatic.  Eyes: EOM are normal.  Neck: Normal range of motion. Neck supple. No tracheal deviation present. No thyromegaly present.  Cardiovascular: Normal rate and regular rhythm.  Pulmonary/Chest: Effort normal and breath sounds normal.  Abdominal: Soft. Bowel sounds are normal. There is no tenderness. There is no guarding.  Musculoskeletal: Normal range of motion. She exhibits no edema.  Neurological: She is alert and oriented to person, place, and time. She has normal reflexes. No cranial nerve deficit. Coordination normal.  Skin: Skin is warm and dry. No rash noted. No erythema. No pallor.  Psychiatric: She has a normal mood and affect. Judgment normal.    CMP Latest Ref Rng & Units 01/29/2018 09/05/2017 09/04/2017  Glucose 65 - 99 mg/dL 163(H) 134(H) 129(H)  BUN 7 - 25 mg/dL 20 11 14   Creatinine 0.50 - 0.99 mg/dL 1.10(H) 0.87 0.87  Sodium 135 - 146 mmol/L 137 139 139  Potassium 3.5 - 5.3 mmol/L 4.2 4.9 4.2  Chloride 98 - 110 mmol/L 91(L) 101 101  CO2 20 - 32 mmol/L 33(H) 30 29  Calcium 8.6 - 10.4 mg/dL 8.5(L) 8.9 8.9  Total Protein 6.1 - 8.1 g/dL 7.1 6.1(L) 6.1(L)  Total Bilirubin 0.2 - 1.2 mg/dL 0.2 0.6 0.5  Alkaline Phos 38 - 126 U/L - 53 59  AST 10 - 35 U/L 11 12(L) 13(L)  ALT 6 - 29 U/L 12 14 17    Lipid Panel     Component Value Date/Time   CHOL 161 07/10/2016 0934   TRIG 153 (H) 07/10/2016 0934   HDL 47 (L) 07/10/2016 0934   CHOLHDL 3.4 07/10/2016 0934   VLDL 31 (H) 07/10/2016 0934   LDLCALC 83 07/10/2016 0934     Diabetic Labs (most recent): Lab Results  Component Value Date   HGBA1C 7.7 (H) 01/29/2018   HGBA1C 7.2 (H) 08/28/2017   HGBA1C 7.5 (H) 07/11/2017    Assessment & Plan:   1.  Uncontrolled type 2 diabetes mellitus with coronary artery disease status post coronary artery bypass graft   Her diabetes is  complicated by coronary artery disease,  neuropathy and patient remains at a high risk for more acute and chronic complications of diabetes which include CAD, CVA, CKD, retinopathy, and neuropathy. These are all discussed in detail with the patient.  - she returns with above target Bg profile and a1c of 7.7% increasing slowly from 6.7%.  - I have re-counseled the patient on diet management and weight loss  by adopting a carbohydrate restricted / protein rich  Diet.  -  Suggestion is made for her to avoid simple carbohydrates  from her diet including Cakes, Sweet Desserts / Pastries, Ice Cream, Soda (diet and regular), Sweet Tea, Candies, Chips, Cookies, Store Bought Juices, Alcohol in Excess of  1-2 drinks a day, Artificial Sweeteners, and "Sugar-free" Products. This will help patient to have stable blood glucose profile and potentially avoid unintended weight gain.   - Patient is advised to stick to a routine mealtimes to eat 3 meals  a day and avoid unnecessary snacks ( to snack only to correct hypoglycemia).  - I have approached patient with the following individualized plan to manage diabetes and patient agrees.  -Based on her presentation, she will tolerate higher dose of basal insulin I discussed and increased her Toujeo to 50 units nightly, associated with monitoring of blood glucose twice a day-daily before breakfast and at bedtime.    - I will continue on metformin 1000 mg by mouth twice a day   with meals and discontinue  Invokana.  - Patient will be considered for incretin therapy as appropriate next visit. - Patient specific target  for A1c; LDL, HDL, Triglycerides, and  Waist Circumference were discussed in detail.  2) BP/HTN: Her blood pressure is not controlled to target.    -She is advised to continue on her current medications including Lasix, metoprolol. 3) Lipids/HPL: Her recent lipid panel showed LDL uncontrolled at 83.  She is advised to continue Lipitor 10 mg p.o. daily.    4)  Weight/Diet: CDE consult in  progress, exercise, and carbohydrates information provided.  5) Hypothyroidism  - Her previsit thyroid function tests are consistent with appropriate replacement.  She is now on amiodarone 200 mg p.o. twice daily. -She is advised to continue levothyroxine 100 mcg p.o. every morning.   - We discussed about correct intake of levothyroxine, at fasting, with water, separated by at least 30 minutes from breakfast, and separated by more than 4 hours from calcium, iron, multivitamins, acid reflux medications (PPIs). -Patient is made aware of the fact that thyroid hormone replacement is needed for life, dose to be adjusted by periodic monitoring of thyroid function tests.  6) Chronic Care/Health Maintenance:  -Patient is on ACEI/ARB and Statin medications and encouraged to continue to follow up with Ophthalmology, Podiatrist at least yearly or according to recommendations, and advised to  stay away from smoking. I have recommended yearly flu vaccine and pneumonia vaccination at least every 5 years; moderate intensity exercise for up to 150 minutes weekly; and  sleep for at least 7 hours a day.  I advised patient to maintain close follow up with her PCP for primary care needs.  - Time spent with the patient: 25 min, of which >50% was spent in reviewing her blood glucose logs , discussing her hypo- and hyper-glycemic episodes, reviewing her current and  previous labs and insulin doses and developing a plan to avoid  hypo- and hyper-glycemia. Please refer to Patient Instructions for Blood Glucose Monitoring and Insulin/Medications Dosing Guide"  in media tab for additional information. Norma [name] participated in the discussions, expressed understanding, and voiced agreement with the above plans.  All questions were answered to her satisfaction. she is encouraged to contact clinic should she have any questions or concerns prior to her return visit.  Follow up plan: Return in about 4 months (around  06/06/2018).  Glade Lloyd, MD Phone: [phone]  Fax: [phone]  This note was partially dictated with voice recognition software. Similar sounding words can be transcribed inadequately or may not  be corrected upon review.  02/03/2018, 12:46 PM

## 2018-02-04 ENCOUNTER — Other Ambulatory Visit (INDEPENDENT_AMBULATORY_CARE_PROVIDER_SITE_OTHER): Payer: Self-pay | Admitting: *Deleted

## 2018-02-04 DIAGNOSIS — D649 Anemia, unspecified: Secondary | ICD-10-CM

## 2018-02-10 DIAGNOSIS — M79672 Pain in left foot: Secondary | ICD-10-CM | POA: Diagnosis not present

## 2018-02-10 DIAGNOSIS — M778 Other enthesopathies, not elsewhere classified: Secondary | ICD-10-CM | POA: Diagnosis not present

## 2018-02-10 DIAGNOSIS — M79671 Pain in right foot: Secondary | ICD-10-CM | POA: Diagnosis not present

## 2018-02-17 NOTE — Progress Notes (Signed)
[facility]  PATIENT: Norma [name] DOB: 1951/02/08   REASON FOR VISIT: Follow-up for stroke  HISTORY FROM: Patient alone at visit  HISTORY OF PRESENT ILLNESS:2/1/19KWMs. Norma [name] is a 67 year old right-handed white female with a history of diabetes, hypertension, and dyslipidemia.  The patient also has a history of frequent headaches, bipolar disorder, essential tremor, and a memory disorder.  The patient underwent MRI evaluation of the brain on 06 May 2017 for evaluation of the memory.  She had no new symptoms of numbness or weakness or speech changes or balance problems prior to the MRI evaluation.  The study showed a small acute cortical left parietal infarct.  The patient comes back to this office for an evaluation.  The patient has just started low-dose aspirin following the MRI.  The patient indicates that she does not smoke cigarettes.  She has not noted any difficulty with swallowing, or any problems with double vision or loss of vision.  She again reports no new numbness or weakness, she does have some numbness in the hands and feet related to her diabetic neuropathy.  MRI of the brain showed a mild degree of small vessel disease that was chronic as well. UPDATE 6/5/2019CM Ms. Norma [name], 67 year old female returns for follow-up with history of stroke seen on MRI in January 2019.  She was started on low-dose aspirin and is now on aspirin 325 daily.  She has not had further stroke or TIA symptoms.  Carotid Doppler  Unremarkable.  2D echo 40 to 45% EF.  She remains on Lipitor without myalgias most recent hemoglobin A1c 7.2 in May 2019.  She continued to have angina and was seen by cardiology.  She had three-vessel CABG on 08/27/2017.  She is currently in inpatient rehab receiving occupational and physical therapy.  She returns for reevaluation. UPDATE 11/6/2019CM Ms. Norma [name], 67 year old female returns for follow-up with history of stroke which was seen on MRI in January 2019.   Patient also has a history of hypertension diabetes dyslipidemia bipolar disorder essential tremor and peripheral neuropathy.  She also has a memory disorder.  She remains on low-dose aspirin without recurrent stroke or TIA symptoms.  Most recent hemoglobin A1c 7.7 in January 29, 2018.  Most recent hemoglobin 11.2.  She was to resume her iron tablets but she has not done this.  Her physical therapy has concluded however she does not use home exercise program.  She is on Lipitor without myalgias.  She had three-vessel CABG in May 2019.  She spent some time in inpatient rehab but is now back at home she lives alone.  She continues to drive without difficulty her memory is stable.  One recent fall no apparent injury some bruises.  She was encouraged to use a cane she returns for reevaluation REVIEW OF SYSTEMS: Full 14 system review of systems performed and notable only for those listed, all others are neg:  Constitutional: neg  Cardiovascular: neg Ear/Nose/Throat: neg  Skin: neg Eyes: neg Respiratory: Shortness of breath Gastroitestinal: Constipation Hematology/Lymphatic: Anemia Endocrine: neg Musculoskeletal: Joint pain back pain walking difficulty Allergy/Immunology: neg Neurological: Memory loss dizziness numbness weakness Psychiatric: neg Sleep : Restless leg   ALLERGIES: Allergies  Allergen Reactions  . Amoxicillin Anaphylaxis and Other (See Comments)    Has patient had a PCN reaction causing immediate rash, facial/tongue/throat swelling, SOB or lightheadedness with hypotension: Yes Has patient had a PCN reaction causing severe rash involving mucus membranes or skin necrosis: Yes Has patient had a PCN reaction that required  hospitalization: Yes Has patient had a PCN reaction occurring within the last 10 years: Yes If all of the above answers are "NO", then may proceed with Cephalosporin use.   [name] Hydrocodone Anaphylaxis  . Vancomycin Itching  . Levaquin [Levofloxacin] Rash  . Depacon  [Valproic Acid] Other (See Comments)    Causes falls     HOME MEDICATIONS: Outpatient Medications Prior to Visit  Medication Sig Dispense Refill  . albuterol (PROVENTIL HFA;VENTOLIN HFA) 108 (90 Base) MCG/ACT inhaler Inhale 2 puffs into the lungs every 6 (six) hours as needed for wheezing or shortness of breath.    [name] aspirin EC 81 MG tablet Take 1 tablet (81 mg total) by mouth daily. 90 tablet 3  . baclofen (LIORESAL) 10 MG tablet Take 1 tablet (10 mg total) by mouth at bedtime. 30 tablet 6  . buPROPion (WELLBUTRIN XL) 300 MG 24 hr tablet TAKE ONE TABLET BY MOUTH EVERY MORNING. 30 tablet 2  . calcium carbonate (TUMS - DOSED IN MG ELEMENTAL CALCIUM) 500 MG chewable tablet Chew 2 tablets by mouth as needed for indigestion or heartburn.     . carvedilol (COREG) 6.25 MG tablet Take 1 tablet (6.25 mg total) by mouth 2 (two) times daily. 180 tablet 3  . EPINEPHrine (EPIPEN 2-PAK) 0.3 mg/0.3 mL IJ SOAJ injection Inject 0.3 mg into the muscle once.    [name] esomeprazole (NEXIUM) 40 MG capsule Take 40 mg by mouth 2 (two) times daily.  12  . gabapentin (NEURONTIN) 300 MG capsule Take 1 capsule (300 mg total) by mouth 2 (two) times daily.    . hydrOXYzine (ATARAX/VISTARIL) 10 MG tablet Take 1 tablet (10 mg total) by mouth 3 (three) times daily as needed for itching. 30 tablet 0  . Insulin Glargine (TOUJEO SOLOSTAR) 300 UNIT/ML SOPN Inject 50 Units into the skin at bedtime. 6 pen 2  . Lactase (LACTAID FAST ACT) 9000 units TABS Take by mouth. Patient states that she takes as needed.    . lamoTRIgine (LAMICTAL) 150 MG tablet TAKE ONE TABLET BY MOUTH AT BEDTIME. 30 tablet 2  . levothyroxine (SYNTHROID, LEVOTHROID) 100 MCG tablet Take 100 mcg by mouth daily before breakfast.     . meclizine (ANTIVERT) 25 MG tablet Take 1 tablet (25 mg total) by mouth 3 (three) times daily as needed for dizziness. (Patient taking differently: Take 25 mg by mouth daily as needed for dizziness. ) 30 tablet 0  . metFORMIN (GLUCOPHAGE)  1000 MG tablet Take 1,000 mg by mouth 2 (two) times daily with a meal.    . Multiple Vitamin (MULTIVITAMIN) capsule Take 1 capsule by mouth daily.    . potassium chloride SA (K-DUR,KLOR-CON) 20 MEQ tablet TAKE ONE TABLET BY MOUTH DAILY 30 tablet 3  . pravastatin (PRAVACHOL) 20 MG tablet Take 1 tablet (20 mg total) by mouth every evening. 90 tablet 3  . rOPINIRole (REQUIP) 2 MG tablet Take 2 mg by mouth 3 (three) times daily. .    . torsemide (DEMADEX) 20 MG tablet Take 20 mg by mouth daily.  5  . traMADol (ULTRAM) 50 MG tablet Take 100 mg by mouth 4 (four) times daily as needed.     . traZODone (DESYREL) 50 MG tablet Take 50 mg by mouth at bedtime.  5  . venlafaxine XR (EFFEXOR-XR) 150 MG 24 hr capsule TAKE TWO (2) CAPSULES BY MOUTH AT BEDTIME. (Patient taking differently: TAKE 300 MG BY MOUTH AT BEDTIME) 60 capsule 2  . aspirin 325 MG tablet Take 325  mg by mouth daily.    [name] losartan (COZAAR) 25 MG tablet Take 1 tablet (25 mg total) by mouth daily. 90 tablet 3  . Polyvinyl Alcohol-Povidone (REFRESH OP) Place 2 drops into both eyes daily as needed (for dry eyes).    [name] omeprazole (PRILOSEC) 40 MG capsule Take 1 capsule (40 mg total) by mouth daily as needed. 90 capsule 3   No facility-administered medications prior to visit.     PAST MEDICAL HISTORY: Past Medical History:  Diagnosis Date  . Anemia   . Anxiety   . Bipolar disorder ([place])   . Bulging lumbar disc    L3-4  . Chronic daily headache   . Chronic low back pain 09/20/2014  . COPD (chronic obstructive pulmonary disease) ([place])   . Degenerative arthritis   . Depression   . Diabetes mellitus, type II ([place])   . DM type 2 with diabetic peripheral neuropathy ([place]) 09/20/2014  . Dyslipidemia   . Dyspnea   . Gastroparesis   . GERD (gastroesophageal reflux disease)   . Heart murmur   . History of hiatal hernia   . Hypothyroidism   . IBS (irritable bowel syndrome)   . Memory difficulty 09/20/2014  . Morbid obesity ([place])   . Neuropathy     . Obstructive sleep apnea on CPAP   . Restless legs syndrome (RLS) 09/17/2012  . Stroke (cerebrum) ([place]) 05/16/2017   Left parietal    PAST SURGICAL HISTORY: Past Surgical History:  Procedure Laterality Date  . ABDOMINAL HYSTERECTOMY    . ARTHROSCOPY KNEE W/ DRILLING  06/2011   and Decemer of 2013.  [name] Carpel tunnel  1980's  . CATARACT EXTRACTION W/PHACO Right 03/21/2017   Procedure: CATARACT EXTRACTION PHACO AND INTRAOCULAR LENS PLACEMENT RIGHT EYE;  Surgeon: Baruch Goldmann, MD;  Location: [facility];  Service: Ophthalmology;  Laterality: Right;  CDE: 2.91   . CATARACT EXTRACTION W/PHACO Left 04/18/2017   Procedure: CATARACT EXTRACTION PHACO AND INTRAOCULAR LENS PLACEMENT LEFT EYE;  Surgeon: Baruch Goldmann, MD;  Location: [facility];  Service: Ophthalmology;  Laterality: Left;  left  . CHOLECYSTECTOMY    . COLONOSCOPY WITH PROPOFOL N/A 10/04/2016   Procedure: COLONOSCOPY WITH PROPOFOL;  Surgeon: Rogene Houston, MD;  Location: [facility];  Service: Endoscopy;  Laterality: N/A;  11:10  . CORONARY ARTERY BYPASS GRAFT N/A 08/29/2017   Procedure: CORONARY ARTERY BYPASS GRAFTING (CABG) x 3 WITH ENDOSCOPIC HARVESTING OF RIGHT SAPHENOUS VEIN;  Surgeon: Ivin Poot, MD;  Location: [place];  Service: Open Heart Surgery;  Laterality: N/A;  . ESOPHAGOGASTRODUODENOSCOPY N/A 04/25/2016   Procedure: ESOPHAGOGASTRODUODENOSCOPY (EGD);  Surgeon: Rogene Houston, MD;  [facility];  Service: Endoscopy;  Laterality: N/A;  730  . LEFT HEART CATH AND CORONARY ANGIOGRAPHY N/A 08/27/2017   Procedure: LEFT HEART CATH AND CORONARY ANGIOGRAPHY;  Surgeon: Sherren Mocha, MD;  Location: [place] [facility] [facility];  Service: Cardiovascular::  pLAD 95% - p-mLAD 50%, ostD1 90% -pD1 80%; ostOM1 90%; rPDA 80%. EF ~50-55% - HK of dital Anterolateral & Apical wall.  - Rec CVTS c/s  . RECTAL SURGERY     fissure  . SHOULDER SURGERY Left    arthroscopy in March of this year  . TEE WITHOUT CARDIOVERSION N/A 08/29/2017    Procedure: TRANSESOPHAGEAL ECHOCARDIOGRAM (TEE);  Surgeon: Prescott Gum, Collier Salina, MD;  Location: [facility];  Service: Open Heart Surgery;  Laterality: N/A;  . TRANSTHORACIC ECHOCARDIOGRAM  06/06/2017   Mild to moderate reduced EF 40 and 45%.  Anterior septal, inferoseptal and basal to mid inferior hypokinesis.  GR 1 DD.  No significant valvular lesion    FAMILY HISTORY: Family History  Problem Relation Age of Onset  . Hypertension Mother   . Lymphoma Mother   . Depression Mother   . Arthritis Father   . Alcohol abuse Father   . Hypertension Sister   . Cancer Brother        kidney and lung  . Alcohol abuse Brother   . Alcohol abuse Paternal Uncle   . Alcohol abuse Paternal Grandfather   . Alcohol abuse Paternal Grandmother     SOCIAL HISTORY: Social History   Socioeconomic History  . Marital status: Single    Spouse name: Not on file  . Number of children: 0  . Years of education: 11  . Highest education level: Not on file  Occupational History    Employer: [employer]  . Financial resource strain: Not on file  . Food insecurity:    Worry: Not on file    Inability: Not on file  . Transportation needs:    Medical: Not on file    Non-medical: Not on file  Tobacco Use  . Smoking status: Former Smoker    Types: Cigarettes    Last attempt to quit: 02/04/1971    Years since quitting: 47.0  . Smokeless tobacco: Never Used  . Tobacco comment: smoked 2 cigarettes a day  Substance and Sexual Activity  . Alcohol use: No    Alcohol/week: 0.0 standard drinks  . Drug use: No  . Sexual activity: Never  Lifestyle  . Physical activity:    Days per week: Not on file    Minutes per session: Not on file  . Stress: Not on file  Relationships  . Social connections:    Talks on phone: Not on file    Gets together: Not on file    Attends religious service: Not on file    Active member of club or organization: Not on file    Attends meetings of clubs or  organizations: Not on file    Relationship status: Not on file  . Intimate partner violence:    Fear of current or ex partner: Not on file    Emotionally abused: Not on file    Physically abused: Not on file    Forced sexual activity: Not on file  Other Topics Concern  . Not on file  Social History Narrative   Patient lives at home alone.    Patient has no children.    Patient has her masters in nursing.    Patient is single.    Patient drinks about 2 glasses of tea daily.   Patient is right handed.     PHYSICAL EXAM  Vitals:   02/18/18 1315  BP: 135/66  Pulse: 92  Weight: 268 lb 12.8 oz (121.9 kg)  Height: 5\' 3"  (1.6 m)   Body mass index is 47.62 kg/m.  Generalized: Well developed, morbidly obese female in no acute distress  Head: normocephalic and atraumatic,. Oropharynx benign  Neck: Supple, no carotid bruits  Cardiac: Regular rate rhythm, no murmur  Musculoskeletal: No deformity   Neurological examination   Mentation: Alert oriented to time, place, history taking. Attention span and concentration appropriate. Recent and remote memory intact.  Follows all commands speech and language fluent. MMSE - Mini Mental State Exam 02/18/2018 09/17/2017  Not completed: (No Data) -  Orientation to time 5 5  Orientation  to Place 5 5  Registration 3 3  Attention/ Calculation 5 4  Recall 3 2  Language- name 2 objects 2 2  Language- repeat 0 1  Language- follow 3 step command 2 3  Language- read & follow direction 1 1  Write a sentence 1 1  Copy design 1 1  Total score 28 28   Clock drawing 4/4  Cranial nerve II-XII: .Pupils were equal round reactive to light extraocular movements were full, visual field were full on confrontational test. Facial sensation and strength were normal. hearing was intact to finger rubbing bilaterally. Uvula tongue midline. head turning and shoulder shrug were normal and symmetric.Tongue protrusion into cheek strength was normal. Motor: normal bulk  and tone, full strength in the BUE, BLE,  Sensory: Sensory testing is intact to pinprick, soft touch, vibration sensation, and position sense on all 4 extremities, with exception of a stocking pattern pinprick sensory deficit up to the knees bilaterally. No evidence of extinction is noted Coordination: finger-nose-finger, heel-to-shin bilaterally, no dysmetria, mild tremors seen with finger-to-nose bilaterally Reflexes: Symmetric and depressed plantar responses were flexor bilaterally. Gait and Station: Ambulated a short distance in the hall, no difficulty with turns can heel and toe walk and mild difficulty with tandem.  Romberg negative no assistive device  DIAGNOSTIC DATA (LABS, IMAGING, TESTING) - I reviewed patient records, labs, notes, testing and imaging myself where available.  Lab Results  Component Value Date   WBC 15.4 (H) 09/05/2017   HGB 11.2 (L) 01/29/2018   HCT 34.6 (L) 01/29/2018   MCV 90.3 09/05/2017   PLT 190 09/05/2017      Component Value Date/Time   NA 137 01/29/2018 1408   K 4.2 01/29/2018 1408   CL 91 (L) 01/29/2018 1408   CO2 33 (H) 01/29/2018 1408   GLUCOSE 163 (H) 01/29/2018 1408   BUN 20 01/29/2018 1408   CREATININE 1.10 (H) 01/29/2018 1408   CALCIUM 8.5 (L) 01/29/2018 1408   PROT 7.1 01/29/2018 1408   ALBUMIN 3.0 (L) 09/05/2017 0332   AST 11 01/29/2018 1408   ALT 12 01/29/2018 1408   ALKPHOS 53 09/05/2017 0332   BILITOT 0.2 01/29/2018 1408   GFRNONAA 53 (L) 01/29/2018 1408   GFRAA 61 01/29/2018 1408   Lab Results  Component Value Date   CHOL 161 07/10/2016   HDL 47 (L) 07/10/2016   LDLCALC 83 07/10/2016   TRIG 153 (H) 07/10/2016   CHOLHDL 3.4 07/10/2016   Lab Results  Component Value Date   HGBA1C 7.7 (H) 01/29/2018   Lab Results  Component Value Date   VITAMINB12 240 09/20/2014   Lab Results  Component Value Date   TSH 2.16 01/29/2018      ASSESSMENT AND PLAN  67 y.o. year old female  has a past medical history of left parietal  stroke , diabetes, hypertension, dyslipidemia, essential tremor, mild cognitive impairment and restless leg syndrome and bipolar disorder.  Readmission to the hospital Aug 27, 2017 for three-vessel CABG.    PLAN: Stressed the importance of management of risk factors to prevent further stroke Continue aspirin .81mg  for secondary stroke prevention  Maintain strict control of hypertension with blood pressure goal below 130/90, today's reading  135/66 continue antihypertensive medications Control of diabetes with hemoglobin A1c below 6.5 followed by primary care most recent hemoglobin A1c 7.7 01/29/18 continue diabetic medications Cholesterol with LDL cholesterol less than 70, followed by primary care,  continue statin drugs Lipitor HEP every day provided by PT  eat  healthy diet with whole grains,  fresh fruits and vegetables Follow-up with primary care for stroke risk factor modification, maintain blood pressure goal less than 483 systolic, diabetes with A7H below 7, lipids with LDL below 70 Follow up with vascular surgery and cardiology Follow up 8 months Dennie Bible, [facility], [facility], [facility] [address], [place], [place] [address] [phone]

## 2018-02-18 ENCOUNTER — Ambulatory Visit (INDEPENDENT_AMBULATORY_CARE_PROVIDER_SITE_OTHER): Payer: PPO | Admitting: Nurse Practitioner

## 2018-02-18 ENCOUNTER — Encounter: Payer: Self-pay | Admitting: Nurse Practitioner

## 2018-02-18 VITALS — BP 135/66 | HR 92 | Ht 63.0 in | Wt 268.8 lb

## 2018-02-18 DIAGNOSIS — I10 Essential (primary) hypertension: Secondary | ICD-10-CM

## 2018-02-18 DIAGNOSIS — R413 Other amnesia: Secondary | ICD-10-CM

## 2018-02-18 DIAGNOSIS — Z8673 Personal history of transient ischemic attack (TIA), and cerebral infarction without residual deficits: Secondary | ICD-10-CM

## 2018-02-18 DIAGNOSIS — E669 Obesity, unspecified: Secondary | ICD-10-CM | POA: Diagnosis not present

## 2018-02-18 DIAGNOSIS — E1169 Type 2 diabetes mellitus with other specified complication: Secondary | ICD-10-CM

## 2018-02-18 DIAGNOSIS — E782 Mixed hyperlipidemia: Secondary | ICD-10-CM | POA: Diagnosis not present

## 2018-02-18 NOTE — Patient Instructions (Signed)
Stressed the importance of management of risk factors to prevent further stroke Continue aspirin .81mg  for secondary stroke prevention  Maintain strict control of hypertension with blood pressure goal below 130/90, today's reading  135/66 continue antihypertensive medications Control of diabetes with hemoglobin A1c below 6.5 followed by primary care most recent hemoglobin A1c 7.7 continue diabetic medications Cholesterol with LDL cholesterol less than 70, followed by primary care,  continue statin drugs Lipitor HEP every day provided by PT  eat healthy diet with whole grains,  fresh fruits and vegetables Follow-up with primary care for stroke risk factor modification, maintain blood pressure goal less than 277 systolic, diabetes with A1O below 7, lipids with LDL below 70 Follow up with vascular surgery and cardiology Follow up 8 months

## 2018-02-18 NOTE — Progress Notes (Signed)
I have read the note, and I agree with the clinical assessment and plan.  Norma [name] K Norma [name]   

## 2018-02-19 ENCOUNTER — Encounter (INDEPENDENT_AMBULATORY_CARE_PROVIDER_SITE_OTHER): Payer: Self-pay | Admitting: Orthopaedic Surgery

## 2018-02-19 ENCOUNTER — Ambulatory Visit (INDEPENDENT_AMBULATORY_CARE_PROVIDER_SITE_OTHER): Payer: PPO | Admitting: Orthopaedic Surgery

## 2018-02-19 VITALS — BP 103/78 | HR 73 | Ht 64.0 in | Wt 265.0 lb

## 2018-02-19 DIAGNOSIS — S8001XA Contusion of right knee, initial encounter: Secondary | ICD-10-CM

## 2018-02-19 DIAGNOSIS — Z6841 Body Mass Index (BMI) 40.0 and over, adult: Secondary | ICD-10-CM | POA: Diagnosis not present

## 2018-02-19 DIAGNOSIS — S8000XA Contusion of unspecified knee, initial encounter: Secondary | ICD-10-CM

## 2018-02-19 NOTE — Progress Notes (Signed)
Office Visit Note   Patient: Norma [name]           Date of Birth: [birth year]           MRN: [id number] Visit Date: 02/19/2018              Requested by: Sinda Du, MD [address] [place],  [address] PCP: Sinda Du, MD   Assessment & Plan: Visit Diagnoses:  1. Class 3 severe obesity due to excess calories with serious comorbidity and body mass index (BMI) of 45.0 to 49.9 in adult [facility])   2. Contusion of knee, unspecified laterality, initial encounter     Plan: Patient has contusions from her fall.  She has multiple medical problems with obesity with comorbidities.  Discussed the importance of weight loss to help with her diabetes, hypertension, heart problems.  She needs to lose more than 30 pounds to get her BMI below 40.  We discussed finding a pool where she could do some exercising, diet.  I offered a referral to weight loss clinic and she can call if she like to proceed with this.  She will work on her weight loss and can call and return as needed.  He can continue the ice Ultram and I have encouraged her to use a cane to decrease her fall risk.  Follow-Up Instructions: Return if symptoms worsen or fail to improve.   Orders:  No orders of the defined types were placed in this encounter.  No orders of the defined types were placed in this encounter.     Procedures: No procedures performed   Clinical Data: No additional findings.   Subjective: Chief Complaint  Patient presents with  . Right Knee - Pain    HPI 67 year old female with bilateral severe knee osteoarthritis fell 02/10/2018 landing on both knees and was not able to get up.  Did make a phone call to have people get her back to the upright position.  She had a knee injection 3 weeks ago by Dr. Durward Fortes and is been using ice and Ultram.  She ambulates with both knees in slight flexion due to the severe arthritis and inability to reach full extension.  BMI is 45 and she is trying to  work on weight loss so that she could be a possible surgical candidate.  She does have lumbar problems with chronic low back pain, bipolar disorder and cardiomyopathy, type 2 diabetes with hypertension previous CABG.  Review of Systems 14 point review of systems updated unchanged from 01/28/2018 office visit with Dr. Durward Fortes other than as mentioned in HPI.   Objective: Vital Signs: BP 103/78   Pulse 73   Ht 5\' 4"  (1.626 m)   Wt 265 lb (120.2 kg)   BMI 45.49 kg/m   Physical Exam  Constitutional: She is oriented to person, place, and time. She appears well-developed.  HENT:  Head: Normocephalic.  Right Ear: External ear normal.  Left Ear: External ear normal.  Eyes: Pupils are equal, round, and reactive to light.  Neck: No tracheal deviation present. No thyromegaly present.  Cardiovascular: Normal rate.  Pulmonary/Chest: Effort normal.  Abdominal: Soft.  Neurological: She is alert and oriented to person, place, and time.  Skin: Skin is warm and dry.  Psychiatric: She has a normal mood and affect. Her behavior is normal.    Ortho Exam patient has slight ecchymosis and abrasions over both knees.  Crepitus with knee extension both right and left.  Both knees passively reach full  extension but with active ambulation both knees remain in 10 to 15 degrees flexion.  She can flex both knees to 9095 degrees.  No calf tenderness.  Good capillary refill to both feet.  No plantar foot lesions.  Specialty Comments:  No specialty comments available.  Imaging: No results found.   PMFS History: Patient Active Problem List   Diagnosis Date Noted  . Mixed hyperlipidemia 09/11/2017  . Pressure injury of skin 09/02/2017  . Coronary artery disease involving native heart without angina pectoris   . S/P CABG x 3   . Leukocytosis   . Acute blood loss anemia   . Post-operative pain   . Bipolar affective disorder ([place])   . Diabetes mellitus type 2 in obese ([place])   . History of CVA  (cerebrovascular accident)   . Hypokalemia   . Hx of CABG 08/29/2017  . Coronary artery disease involving native coronary artery of native heart with unstable angina pectoris ([place]) 08/28/2017  . Unstable angina ([place]) 08/27/2017  . Cardiomyopathy ([place]) 08/22/2017  . Stroke (cerebrum) ([place]) 05/16/2017  . Low hemoglobin 09/19/2016  . Dizziness 06/21/2016  . Chest pain with moderate risk for cardiac etiology 04/23/2016  . Bipolar I disorder, most recent episode depressed ([place]) 06/05/2015  . DM type 2 causing vascular disease ([place]) 09/20/2014  . Chronic low back pain 09/20/2014  . Morbid obesity, unspecified obesity type ([place]) 09/20/2014  . Memory difficulty 09/20/2014  . Lumbosacral spondylosis without myelopathy 12/25/2012  . Gastroparesis 11/04/2012  . Nausea alone 11/04/2012  . Restless legs syndrome (RLS) 09/17/2012  . Headache(784.0) 03/19/2012  . GERD (gastroesophageal reflux disease) 02/16/2012  . Abdominal pain 02/16/2012  . Hypothyroidism 02/16/2012  . Tremor 02/16/2012  . Hyperlipidemia associated with type 2 diabetes mellitus ([place]) 04/01/2008  . Obstructive sleep apnea 04/01/2008  . Essential hypertension, benign 04/01/2008  . LBBB (left bundle branch block) 04/01/2008   Past Medical History:  Diagnosis Date  . Anemia   . Anxiety   . Bipolar disorder ([place])   . Bulging lumbar disc    L3-4  . Chronic daily headache   . Chronic low back pain 09/20/2014  . COPD (chronic obstructive pulmonary disease) ([place])   . Degenerative arthritis   . Depression   . Diabetes mellitus, type II ([place])   . DM type 2 with diabetic peripheral neuropathy ([place]) 09/20/2014  . Dyslipidemia   . Dyspnea   . Gastroparesis   . GERD (gastroesophageal reflux disease)   . Heart murmur   . History of hiatal hernia   . Hypothyroidism   . IBS (irritable bowel syndrome)   . Memory difficulty 09/20/2014  . Morbid obesity ([place])   . Neuropathy   . Obstructive sleep apnea on CPAP   . Restless legs  syndrome (RLS) 09/17/2012  . Stroke (cerebrum) ([place]) 05/16/2017   Left parietal    Family History  Problem Relation Age of Onset  . Hypertension Mother   . Lymphoma Mother   . Depression Mother   . Arthritis Father   . Alcohol abuse Father   . Hypertension Sister   . Cancer Brother        kidney and lung  . Alcohol abuse Brother   . Alcohol abuse Paternal Uncle   . Alcohol abuse Paternal Grandfather   . Alcohol abuse Paternal Grandmother     Past Surgical History:  Procedure Laterality Date  . ABDOMINAL HYSTERECTOMY    . ARTHROSCOPY KNEE W/ DRILLING  06/2011   and Decemer of 2013.  [name]  Carpel tunnel  1980's  . CATARACT EXTRACTION W/PHACO Right 03/21/2017   Procedure: CATARACT EXTRACTION PHACO AND INTRAOCULAR LENS PLACEMENT RIGHT EYE;  Surgeon: Baruch Goldmann, MD;  [facility]: [facility];  Service: Ophthalmology;  Laterality: Right;  CDE: 2.91   . CATARACT EXTRACTION W/PHACO Left 04/18/2017   Procedure: CATARACT EXTRACTION PHACO AND INTRAOCULAR LENS PLACEMENT LEFT EYE;  Surgeon: Baruch Goldmann, MD;  Location: [facility];  Service: Ophthalmology;  Laterality: Left;  left  . CHOLECYSTECTOMY    . COLONOSCOPY WITH PROPOFOL N/A 10/04/2016   Procedure: COLONOSCOPY WITH PROPOFOL;  Surgeon: Rogene Houston, MD;  [facility];  Service: Endoscopy;  Laterality: N/A;  11:10  . CORONARY ARTERY BYPASS GRAFT N/A 08/29/2017   Procedure: CORONARY ARTERY BYPASS GRAFTING (CABG) x 3 WITH ENDOSCOPIC HARVESTING OF RIGHT SAPHENOUS VEIN;  Surgeon: Ivin Poot, MD;  Location: [facility];  Service: Open Heart Surgery;  Laterality: N/A;  . ESOPHAGOGASTRODUODENOSCOPY N/A 04/25/2016   Procedure: ESOPHAGOGASTRODUODENOSCOPY (EGD);  Surgeon: Rogene Houston, MD;  [facility];  Service: Endoscopy;  Laterality: N/A;  730  . LEFT HEART CATH AND CORONARY ANGIOGRAPHY N/A 08/27/2017   Procedure: LEFT HEART CATH AND CORONARY ANGIOGRAPHY;  Surgeon: Sherren Mocha, MD;  Location: [place] [facility];  Service:  Cardiovascular::  pLAD 95% - p-mLAD 50%, ostD1 90% -pD1 80%; ostOM1 90%; rPDA 80%. EF ~50-55% - HK of dital Anterolateral & Apical wall.  - Rec CVTS c/s  . RECTAL SURGERY     fissure  . SHOULDER SURGERY Left    arthroscopy in March of this year  . TEE WITHOUT CARDIOVERSION N/A 08/29/2017   Procedure: TRANSESOPHAGEAL ECHOCARDIOGRAM (TEE);  Surgeon: Prescott Gum, Collier Salina, MD;  Location: [facility];  Service: Open Heart Surgery;  Laterality: N/A;  . TRANSTHORACIC ECHOCARDIOGRAM  06/06/2017   Mild to moderate reduced EF 40 and 45%.  Anterior septal, inferoseptal and basal to mid inferior hypokinesis.  GR 1 DD.  No significant valvular lesion   Social History   Occupational History    Employer: [employer]  Tobacco Use  . Smoking status: Former Smoker    Types: Cigarettes    Last attempt to quit: 02/04/1971    Years since quitting: 47.0  . Smokeless tobacco: Never Used  . Tobacco comment: smoked 2 cigarettes a day  Substance and Sexual Activity  . Alcohol use: No    Alcohol/week: 0.0 standard drinks  . Drug use: No  . Sexual activity: Never

## 2018-02-20 ENCOUNTER — Encounter (INDEPENDENT_AMBULATORY_CARE_PROVIDER_SITE_OTHER): Payer: Self-pay | Admitting: Orthopaedic Surgery

## 2018-02-20 DIAGNOSIS — Z6841 Body Mass Index (BMI) 40.0 and over, adult: Principal | ICD-10-CM

## 2018-02-20 DIAGNOSIS — E66813 Obesity, class 3: Secondary | ICD-10-CM | POA: Insufficient documentation

## 2018-02-21 DIAGNOSIS — J449 Chronic obstructive pulmonary disease, unspecified: Secondary | ICD-10-CM | POA: Diagnosis not present

## 2018-02-21 DIAGNOSIS — G4730 Sleep apnea, unspecified: Secondary | ICD-10-CM | POA: Diagnosis not present

## 2018-03-03 ENCOUNTER — Ambulatory Visit: Payer: Self-pay | Admitting: Student

## 2018-03-03 ENCOUNTER — Encounter (INDEPENDENT_AMBULATORY_CARE_PROVIDER_SITE_OTHER): Payer: Self-pay

## 2018-03-04 ENCOUNTER — Other Ambulatory Visit (INDEPENDENT_AMBULATORY_CARE_PROVIDER_SITE_OTHER): Payer: Self-pay | Admitting: *Deleted

## 2018-03-04 ENCOUNTER — Encounter (INDEPENDENT_AMBULATORY_CARE_PROVIDER_SITE_OTHER): Payer: Self-pay | Admitting: *Deleted

## 2018-03-04 DIAGNOSIS — D649 Anemia, unspecified: Secondary | ICD-10-CM

## 2018-03-06 ENCOUNTER — Other Ambulatory Visit: Payer: Self-pay | Admitting: *Deleted

## 2018-03-06 MED ORDER — TORSEMIDE 20 MG PO TABS: 20.0000 mg | ORAL_TABLET | Freq: Every day | ORAL | 5 refills | Status: DC

## 2018-03-19 ENCOUNTER — Other Ambulatory Visit: Payer: Self-pay | Admitting: *Deleted

## 2018-03-19 MED ORDER — POTASSIUM CHLORIDE CRYS ER 20 MEQ PO TBCR: 20.0000 meq | EXTENDED_RELEASE_TABLET | Freq: Every day | ORAL | 2 refills | Status: DC

## 2018-03-23 DIAGNOSIS — J449 Chronic obstructive pulmonary disease, unspecified: Secondary | ICD-10-CM | POA: Diagnosis not present

## 2018-03-23 DIAGNOSIS — G4730 Sleep apnea, unspecified: Secondary | ICD-10-CM | POA: Diagnosis not present

## 2018-03-27 DIAGNOSIS — J449 Chronic obstructive pulmonary disease, unspecified: Secondary | ICD-10-CM | POA: Diagnosis not present

## 2018-03-27 DIAGNOSIS — G4730 Sleep apnea, unspecified: Secondary | ICD-10-CM | POA: Diagnosis not present

## 2018-04-01 ENCOUNTER — Ambulatory Visit: Payer: PPO | Admitting: Nurse Practitioner

## 2018-04-09 ENCOUNTER — Encounter: Payer: Self-pay | Admitting: Cardiology

## 2018-04-09 ENCOUNTER — Ambulatory Visit (INDEPENDENT_AMBULATORY_CARE_PROVIDER_SITE_OTHER): Payer: PPO | Admitting: Cardiology

## 2018-04-09 VITALS — BP 134/84 | HR 86

## 2018-04-09 DIAGNOSIS — I5033 Acute on chronic diastolic (congestive) heart failure: Secondary | ICD-10-CM | POA: Diagnosis not present

## 2018-04-09 MED ORDER — TORSEMIDE 20 MG PO TABS: ORAL_TABLET | ORAL | 3 refills | Status: DC

## 2018-04-09 NOTE — Progress Notes (Signed)
Clinical Summary Norma [name] is a 67 y.o.female seen today for follow up of the following medical problems.This is a focused visit on recent issues with leg swelling and acute on chronic diastolic HF.    1. Acute on chronic diastolic HF 16/0737 echo VLEF 10-62%, grade I diastolic dysfunction.  - last visit incrased torsemide to 40mg  x 3 days, then 20mg  daliy.  - reports on 40mg  daily significant weight loss down to 259 lbs, but when goes back to 20mg  daily regains weight.    Past Medical History:  Diagnosis Date  . Anemia   . Anxiety   . Bipolar disorder ([place])   . Bulging lumbar disc    L3-4  . Chronic daily headache   . Chronic low back pain 09/20/2014  . COPD (chronic obstructive pulmonary disease) ([place])   . Degenerative arthritis   . Depression   . Diabetes mellitus, type II ([place])   . DM type 2 with diabetic peripheral neuropathy ([place]) 09/20/2014  . Dyslipidemia   . Dyspnea   . Gastroparesis   . GERD (gastroesophageal reflux disease)   . Heart murmur   . History of hiatal hernia   . Hypothyroidism   . IBS (irritable bowel syndrome)   . Memory difficulty 09/20/2014  . Morbid obesity ([place])   . Neuropathy   . Obstructive sleep apnea on CPAP   . Restless legs syndrome (RLS) 09/17/2012  . Stroke (cerebrum) ([place]) 05/16/2017   Left parietal     Allergies  Allergen Reactions  . Amoxicillin Anaphylaxis and Other (See Comments)    Has patient had a PCN reaction causing immediate rash, facial/tongue/throat swelling, SOB or lightheadedness with hypotension: Yes Has patient had a PCN reaction causing severe rash involving mucus membranes or skin necrosis: Yes Has patient had a PCN reaction that required hospitalization: Yes Has patient had a PCN reaction occurring within the last 10 years: Yes If all of the above answers are "NO", then may proceed with Cephalosporin use.   [name] Hydrocodone Anaphylaxis  . Vancomycin Itching  . Levaquin [Levofloxacin] Rash  . Depacon [Valproic  Acid] Other (See Comments)    Causes falls      Current Outpatient Medications  Medication Sig Dispense Refill  . albuterol (PROVENTIL HFA;VENTOLIN HFA) 108 (90 Base) MCG/ACT inhaler Inhale 2 puffs into the lungs every 6 (six) hours as needed for wheezing or shortness of breath.    [name] aspirin EC 81 MG tablet Take 1 tablet (81 mg total) by mouth daily. 90 tablet 3  . baclofen (LIORESAL) 10 MG tablet Take 1 tablet (10 mg total) by mouth at bedtime. 30 tablet 6  . buPROPion (WELLBUTRIN XL) 300 MG 24 hr tablet TAKE ONE TABLET BY MOUTH EVERY MORNING. 30 tablet 2  . calcium carbonate (TUMS - DOSED IN MG ELEMENTAL CALCIUM) 500 MG chewable tablet Chew 2 tablets by mouth as needed for indigestion or heartburn.     . carvedilol (COREG) 6.25 MG tablet Take 1 tablet (6.25 mg total) by mouth 2 (two) times daily. 180 tablet 3  . EPINEPHrine (EPIPEN 2-PAK) 0.3 mg/0.3 mL IJ SOAJ injection Inject 0.3 mg into the muscle once.    [name] esomeprazole (NEXIUM) 40 MG capsule Take 40 mg by mouth 2 (two) times daily.  12  . gabapentin (NEURONTIN) 300 MG capsule Take 1 capsule (300 mg total) by mouth 2 (two) times daily.    . hydrOXYzine (ATARAX/VISTARIL) 10 MG tablet Take 1 tablet (10 mg total) by mouth 3 (  three) times daily as needed for itching. 30 tablet 0  . Insulin Glargine (TOUJEO SOLOSTAR) 300 UNIT/ML SOPN Inject 50 Units into the skin at bedtime. 6 pen 2  . Lactase (LACTAID FAST ACT) 9000 units TABS Take by mouth. Patient states that she takes as needed.    . lamoTRIgine (LAMICTAL) 150 MG tablet TAKE ONE TABLET BY MOUTH AT BEDTIME. 30 tablet 2  . levothyroxine (SYNTHROID, LEVOTHROID) 100 MCG tablet Take 100 mcg by mouth daily before breakfast.     . meclizine (ANTIVERT) 25 MG tablet Take 1 tablet (25 mg total) by mouth 3 (three) times daily as needed for dizziness. (Patient taking differently: Take 25 mg by mouth daily as needed for dizziness. ) 30 tablet 0  . metFORMIN (GLUCOPHAGE) 1000 MG tablet Take 1,000 mg by  mouth 2 (two) times daily with a meal.    . Multiple Vitamin (MULTIVITAMIN) capsule Take 1 capsule by mouth daily.    . Polyvinyl Alcohol-Povidone (REFRESH OP) Place 2 drops into both eyes daily as needed (for dry eyes).    . potassium chloride SA (K-DUR,KLOR-CON) 20 MEQ tablet Take 1 tablet (20 mEq total) by mouth daily. 90 tablet 2  . pravastatin (PRAVACHOL) 20 MG tablet Take 1 tablet (20 mg total) by mouth every evening. 90 tablet 3  . rOPINIRole (REQUIP) 2 MG tablet Take 2 mg by mouth 3 (three) times daily. .    . torsemide (DEMADEX) 20 MG tablet Take 1 tablet (20 mg total) by mouth daily. If significant weight gain to 265 or higher or significant edema ok to take torsemide 40mg  for 2-3 days 60 tablet 5  . traMADol (ULTRAM) 50 MG tablet Take 100 mg by mouth 4 (four) times daily as needed.     . traZODone (DESYREL) 50 MG tablet Take 50 mg by mouth at bedtime.  5  . venlafaxine XR (EFFEXOR-XR) 150 MG 24 hr capsule TAKE TWO (2) CAPSULES BY MOUTH AT BEDTIME. (Patient taking differently: TAKE 300 MG BY MOUTH AT BEDTIME) 60 capsule 2  . losartan (COZAAR) 25 MG tablet Take 1 tablet (25 mg total) by mouth daily. 90 tablet 3   No current facility-administered medications for this visit.      Past Surgical History:  Procedure Laterality Date  . ABDOMINAL HYSTERECTOMY    . ARTHROSCOPY KNEE W/ DRILLING  06/2011   and Decemer of 2013.  [name] Carpel tunnel  1980's  . CATARACT EXTRACTION W/PHACO Right 03/21/2017   Procedure: CATARACT EXTRACTION PHACO AND INTRAOCULAR LENS PLACEMENT RIGHT EYE;  Surgeon: Baruch Goldmann, MD;  [facility];  Service: Ophthalmology;  Laterality: Right;  CDE: 2.91   . CATARACT EXTRACTION W/PHACO Left 04/18/2017   Procedure: CATARACT EXTRACTION PHACO AND INTRAOCULAR LENS PLACEMENT LEFT EYE;  Surgeon: Baruch Goldmann, MD;  [facility];  Service: Ophthalmology;  Laterality: Left;  left  . CHOLECYSTECTOMY    . COLONOSCOPY WITH PROPOFOL N/A 10/04/2016   Procedure: COLONOSCOPY  WITH PROPOFOL;  Surgeon: Rogene Houston, MD;  [facility];  Service: Endoscopy;  Laterality: N/A;  11:10  . CORONARY ARTERY BYPASS GRAFT N/A 08/29/2017   Procedure: CORONARY ARTERY BYPASS GRAFTING (CABG) x 3 WITH ENDOSCOPIC HARVESTING OF RIGHT SAPHENOUS VEIN;  Surgeon: Ivin Poot, MD;  Location: [facility];  Service: Open Heart Surgery;  Laterality: N/A;  . ESOPHAGOGASTRODUODENOSCOPY N/A 04/25/2016   Procedure: ESOPHAGOGASTRODUODENOSCOPY (EGD);  Surgeon: Rogene Houston, MD;  [facility];  Service: Endoscopy;  Laterality: N/A;  730  .  LEFT HEART CATH AND CORONARY ANGIOGRAPHY N/A 08/27/2017   Procedure: LEFT HEART CATH AND CORONARY ANGIOGRAPHY;  Surgeon: Sherren Mocha, MD;  Location: [facility];  Service: Cardiovascular::  pLAD 95% - p-mLAD 50%, ostD1 90% -pD1 80%; ostOM1 90%; rPDA 80%. EF ~50-55% - HK of dital Anterolateral & Apical wall.  - Rec CVTS c/s  . RECTAL SURGERY     fissure  . SHOULDER SURGERY Left    arthroscopy in March of this year  . TEE WITHOUT CARDIOVERSION N/A 08/29/2017   Procedure: TRANSESOPHAGEAL ECHOCARDIOGRAM (TEE);  Surgeon: Prescott Gum, Collier Salina, MD;  Location: [facility];  Service: Open Heart Surgery;  Laterality: N/A;  . TRANSTHORACIC ECHOCARDIOGRAM  06/06/2017   Mild to moderate reduced EF 40 and 45%.  Anterior septal, inferoseptal and basal to mid inferior hypokinesis.  GR 1 DD.  No significant valvular lesion     Allergies  Allergen Reactions  . Amoxicillin Anaphylaxis and Other (See Comments)    Has patient had a PCN reaction causing immediate rash, facial/tongue/throat swelling, SOB or lightheadedness with hypotension: Yes Has patient had a PCN reaction causing severe rash involving mucus membranes or skin necrosis: Yes Has patient had a PCN reaction that required hospitalization: Yes Has patient had a PCN reaction occurring within the last 10 years: Yes If all of the above answers are "NO", then may proceed with Cephalosporin use.   [name]  Hydrocodone Anaphylaxis  . Vancomycin Itching  . Levaquin [Levofloxacin] Rash  . Depacon [Valproic Acid] Other (See Comments)    Causes falls       Family History  Problem Relation Age of Onset  . Hypertension Mother   . Lymphoma Mother   . Depression Mother   . Arthritis Father   . Alcohol abuse Father   . Hypertension Sister   . Cancer Brother        kidney and lung  . Alcohol abuse Brother   . Alcohol abuse Paternal Uncle   . Alcohol abuse Paternal Grandfather   . Alcohol abuse Paternal Grandmother      Social History Norma [name] reports that she quit smoking about 47 years ago. Her smoking use included cigarettes. She has never used smokeless tobacco. Norma [name] reports no history of alcohol use.   Review of Systems CONSTITUTIONAL: No weight loss, fever, chills, weakness or fatigue.  HEENT: Eyes: No visual loss, blurred vision, double vision or yellow sclerae.No hearing loss, sneezing, congestion, runny nose or sore throat.  SKIN: No rash or itching.  CARDIOVASCULAR: per hpi RESPIRATORY: No shortness of breath, cough or sputum.  GASTROINTESTINAL: No anorexia, nausea, vomiting or diarrhea. No abdominal pain or blood.  GENITOURINARY: No burning on urination, no polyuria NEUROLOGICAL: No headache, dizziness, syncope, paralysis, ataxia, numbness or tingling in the extremities. No change in bowel or bladder control.  MUSCULOSKELETAL: No muscle, back pain, joint pain or stiffness.  LYMPHATICS: No enlarged nodes. No history of splenectomy.  PSYCHIATRIC: No history of depression or anxiety.  ENDOCRINOLOGIC: No reports of sweating, cold or heat intolerance. No polyuria or polydipsia.  [name]   Physical Examination Vitals:   04/09/18 1545  BP: 134/84  Pulse: 86  SpO2: 94%   There were no vitals filed for this visit.  Gen: resting comfortably, no acute distress HEENT: no scleral icterus, pupils equal round and reactive, no palptable cervical adenopathy,  CV:RRR, 2/6  systolic murmur rusb, no jvd Resp: Clear to auscultation bilaterally GI: abdomen is soft, non-tender, non-distended, normal bowel sounds, no hepatosplenomegaly MSK:  extremities are warm, no edema.  Skin: warm, no rash Neuro:  no focal deficits Psych: appropriate affect   Diagnostic Studies 03/2016 Lexiscan  Probable normal perfusion and mild soft tissue attenuation (breast) No significant ischemia or evidence for scar  The left ventricular ejection fraction is normal (55-65%).  Low risk scan.  08/2017 cath  RPDA lesion is 80% stenosed.  Ost 1st Mrg to 1st Mrg lesion is 90% stenosed.  Prox LAD lesion is 95% stenosed.  Prox LAD to Mid LAD lesion is 50% stenosed.  Ost 1st Diag lesion is 90% stenosed.  1st Diag lesion is 80% stenosed.  The left ventricular ejection fraction is 50-55% by visual estimate.  There is mild left ventricular systolic dysfunction.  1. Severe multivessel coronary artery disease with severe stenosis of the proximal LAD/first diagonal bifurcation, first obtuse marginal Norma [name] of the circumflex, and PDA Norma [name] of the RCA. 2. Mild segmental LV systolic dysfunction with hypokinesis of the distal anterolateral and apical walls, LVEF estimated at 50 to 55%.  08/2017 TEE  Aortic valve: The valve is trileaflet. No stenosis. No regurgitation.  Mitral valve: Mild regurgitation. Central jet.  Right ventricle: Normal cavity size, wall thickness and ejection fraction.  Pericardium: Trivial pericardial effusion.  Tricuspid valve: No regurgitation  Pulmonic valve: No regurgitation by color doppler.  Left ventricle: Regional wall motion abnormalities present, dyskinetic basal and apical septal wall, hypokinesis of lateral wall and inferoseptal wall. Increased wall thickness. LVEF 35-40%.  No ASD/PFO present  Unable to rule out thrombus in LAA but velocities make thrombus less likely.  05/2017 echo Study Conclusions  - Left ventricle: The cavity  size was normal. There was mild concentric hypertrophy. Systolic function was mildly to moderately reduced. The estimated ejection fraction was in the range of 40% to 45%. Hypokinesis of the anteroseptal, inferoseptal and basal to mid inferior myocardium. Doppler parameters are consistent with abnormal left ventricular relaxation (grade 1 diastolic dysfunction). Doppler parameters are consistent with indeterminate ventricular filling pressure. - Aortic valve: Transvalvular velocity was within the normal range. There was no stenosis. There was no regurgitation. - Mitral valve: Transvalvular velocity was within the normal range. There was no evidence for stenosis. There was no regurgitation. - Right ventricle: The cavity size was normal. Wall thickness was normal. Systolic function was normal. - Tricuspid valve: There was trivial regurgitation. - Global longitudinal strain -11.9%.   01/2018 echo Study Conclusions  - Left ventricle: The cavity size was normal. Wall thickness was   increased in a pattern of mild LVH. Systolic function was normal.   The estimated ejection fraction was in the range of 50% to 55%.   Regional wall motion abnormalities cannot be excluded. Doppler   parameters are consistent with abnormal left ventricular   relaxation (grade 1 diastolic dysfunction). - Ventricular septum: Septal motion showed abnormal function and   dyssynergy. These changes are consistent with a left bundle   Mikaiah Stoffer block. - Mitral valve: Mildly thickened leaflets .  Assessment and Plan   1.Acute on chronic diastolic HF - echo shows LVEF has normalized, does have grade I diastolic dysfunction - rapid diuresis with torsemide 40mg  daily, inadequate diuresis with 20mg  daily. Will try alternating days with 40mg  and 20mg  daily, check BMET/Mg in 2 weeks      Norma [name], M.D

## 2018-04-09 NOTE — Patient Instructions (Signed)
Medication Instructions:   Change Torsemide to 40mg  alternating days with 20mg  daily.   Continue all other medications.    Labwork:  BMET, Magnesium - orders given today.   Please do in 2 weeks.   Office will contact with results via phone or letter.    Testing/Procedures: none  Follow-Up: 4 months   Any Other Special Instructions Will Be Listed Below (If Applicable).  If you need a refill on your cardiac medications before your next appointment, please call your pharmacy.

## 2018-04-10 ENCOUNTER — Encounter: Payer: Self-pay | Admitting: Cardiology

## 2018-04-10 ENCOUNTER — Encounter (HOSPITAL_COMMUNITY): Payer: PPO

## 2018-04-21 DIAGNOSIS — I10 Essential (primary) hypertension: Secondary | ICD-10-CM | POA: Diagnosis not present

## 2018-04-21 DIAGNOSIS — J449 Chronic obstructive pulmonary disease, unspecified: Secondary | ICD-10-CM | POA: Diagnosis not present

## 2018-04-21 DIAGNOSIS — I2510 Atherosclerotic heart disease of native coronary artery without angina pectoris: Secondary | ICD-10-CM | POA: Diagnosis not present

## 2018-04-21 DIAGNOSIS — E1169 Type 2 diabetes mellitus with other specified complication: Secondary | ICD-10-CM | POA: Diagnosis not present

## 2018-04-23 DIAGNOSIS — L97521 Non-pressure chronic ulcer of other part of left foot limited to breakdown of skin: Secondary | ICD-10-CM | POA: Diagnosis not present

## 2018-04-24 DIAGNOSIS — I2510 Atherosclerotic heart disease of native coronary artery without angina pectoris: Secondary | ICD-10-CM | POA: Diagnosis not present

## 2018-04-24 DIAGNOSIS — E1169 Type 2 diabetes mellitus with other specified complication: Secondary | ICD-10-CM | POA: Diagnosis not present

## 2018-04-24 DIAGNOSIS — J449 Chronic obstructive pulmonary disease, unspecified: Secondary | ICD-10-CM | POA: Diagnosis not present

## 2018-04-24 DIAGNOSIS — I10 Essential (primary) hypertension: Secondary | ICD-10-CM | POA: Diagnosis not present

## 2018-04-24 LAB — TSH: TSH: 3.05 (ref 0.41–5.90)

## 2018-04-24 LAB — LIPID PANEL
Cholesterol: 176 (ref 0–200)
HDL: 59 (ref 35–70)
LDL CALC: 96
Triglycerides: 114 (ref 40–160)

## 2018-04-24 LAB — BASIC METABOLIC PANEL
BUN: 13 (ref 4–21)
CREATININE: 0.9 (ref 0.5–1.1)

## 2018-04-25 LAB — HEMOGLOBIN A1C: Hemoglobin A1C: 7.2

## 2018-04-29 ENCOUNTER — Other Ambulatory Visit (HOSPITAL_COMMUNITY): Payer: Self-pay | Admitting: Respiratory Therapy

## 2018-04-29 DIAGNOSIS — R0602 Shortness of breath: Secondary | ICD-10-CM

## 2018-04-30 ENCOUNTER — Telehealth (INDEPENDENT_AMBULATORY_CARE_PROVIDER_SITE_OTHER): Payer: Self-pay | Admitting: Internal Medicine

## 2018-04-30 NOTE — Telephone Encounter (Signed)
Patient would like a call back at [phone]

## 2018-05-06 ENCOUNTER — Ambulatory Visit (HOSPITAL_COMMUNITY)
Admission: RE | Admit: 2018-05-06 | Discharge: 2018-05-06 | Disposition: A | Discharge status: Home / Self Care | Payer: Medicare Other | Source: Ambulatory Visit | Attending: Pulmonary Disease | Admitting: Pulmonary Disease

## 2018-05-06 ENCOUNTER — Other Ambulatory Visit (HOSPITAL_COMMUNITY): Payer: Self-pay | Admitting: Pulmonary Disease

## 2018-05-06 DIAGNOSIS — R0602 Shortness of breath: Secondary | ICD-10-CM | POA: Insufficient documentation

## 2018-05-06 LAB — PULMONARY FUNCTION TEST
DL/VA % pred: 84 %
DL/VA: 4.05 ml/min/mmHg/L
DLCO unc % pred: 72 %
DLCO unc: 17.63 ml/min/mmHg
FEF 25-75 Post: 2.7 L/sec
FEF 25-75 Pre: 3 L/sec
FEF2575-%Change-Post: -9 %
FEF2575-%PRED-POST: 126 %
FEF2575-%Pred-Pre: 141 %
FEV1-%Change-Post: -1 %
FEV1-%Pred-Post: 100 %
FEV1-%Pred-Pre: 102 %
FEV1-Post: 2.44 L
FEV1-Pre: 2.48 L
FEV1FVC-%Change-Post: 0 %
FEV1FVC-%Pred-Pre: 108 %
FEV6-%Change-Post: -4 %
FEV6-%Pred-Post: 93 %
FEV6-%Pred-Pre: 97 %
FEV6-Post: 2.85 L
FEV6-Pre: 2.97 L
FEV6FVC-%Change-Post: -1 %
FEV6FVC-%PRED-PRE: 104 %
FEV6FVC-%Pred-Post: 102 %
FVC-%Change-Post: -2 %
FVC-%Pred-Post: 91 %
FVC-%Pred-Pre: 93 %
FVC-Post: 2.89 L
FVC-Pre: 2.97 L
Post FEV1/FVC ratio: 84 %
Post FEV6/FVC ratio: 99 %
Pre FEV1/FVC ratio: 84 %
Pre FEV6/FVC Ratio: 100 %
RV % PRED: 100 %
RV: 2.11 L
TLC % pred: 100 %
TLC: 5.08 L

## 2018-05-06 MED ORDER — ALBUTEROL SULFATE (2.5 MG/3ML) 0.083% IN NEBU
2.5000 mg | INHALATION_SOLUTION | Freq: Once | RESPIRATORY_TRACT | Status: AC
  Administered 2018-05-06: 2.5 mg via RESPIRATORY_TRACT

## 2018-05-07 DIAGNOSIS — L97521 Non-pressure chronic ulcer of other part of left foot limited to breakdown of skin: Secondary | ICD-10-CM | POA: Diagnosis not present

## 2018-05-08 NOTE — Telephone Encounter (Signed)
Patient was called and her mailbox is full and no messages can be left.

## 2018-05-14 ENCOUNTER — Other Ambulatory Visit: Payer: Self-pay | Admitting: Nurse Practitioner

## 2018-06-08 ENCOUNTER — Encounter: Payer: Self-pay | Admitting: "Endocrinology

## 2018-06-08 ENCOUNTER — Ambulatory Visit (INDEPENDENT_AMBULATORY_CARE_PROVIDER_SITE_OTHER): Payer: Medicare Other | Admitting: "Endocrinology

## 2018-06-08 VITALS — BP 129/84 | HR 88 | Temp 98.3°F | Ht 64.0 in | Wt 251.0 lb

## 2018-06-08 DIAGNOSIS — I10 Essential (primary) hypertension: Secondary | ICD-10-CM | POA: Diagnosis not present

## 2018-06-08 DIAGNOSIS — E038 Other specified hypothyroidism: Secondary | ICD-10-CM

## 2018-06-08 DIAGNOSIS — E782 Mixed hyperlipidemia: Secondary | ICD-10-CM

## 2018-06-08 DIAGNOSIS — E1159 Type 2 diabetes mellitus with other circulatory complications: Secondary | ICD-10-CM | POA: Diagnosis not present

## 2018-06-08 NOTE — Patient Instructions (Signed)

## 2018-06-08 NOTE — Progress Notes (Signed)
Endocrinology follow-up note   Subjective:    Patient ID: Norma [name], female    DOB: 1950-05-22,    Past Medical History:  Diagnosis Date  . Anemia   . Anxiety   . Bipolar disorder ([place])   . Bulging lumbar disc    L3-4  . Chronic daily headache   . Chronic low back pain 09/20/2014  . COPD (chronic obstructive pulmonary disease) ([place])   . Degenerative arthritis   . Depression   . Diabetes mellitus, type II ([place])   . DM type 2 with diabetic peripheral neuropathy ([place]) 09/20/2014  . Dyslipidemia   . Dyspnea   . Gastroparesis   . GERD (gastroesophageal reflux disease)   . Heart murmur   . History of hiatal hernia   . Hypothyroidism   . IBS (irritable bowel syndrome)   . Memory difficulty 09/20/2014  . Morbid obesity ([place])   . Neuropathy   . Obstructive sleep apnea on CPAP   . Restless legs syndrome (RLS) 09/17/2012  . Stroke (cerebrum) ([place]) 05/16/2017   Left parietal   Past Surgical History:  Procedure Laterality Date  . ABDOMINAL HYSTERECTOMY    . ARTHROSCOPY KNEE W/ DRILLING  06/2011   and Decemer of 2013.  [name] Carpel tunnel  1980's  . CATARACT EXTRACTION W/PHACO Right 03/21/2017   Procedure: CATARACT EXTRACTION PHACO AND INTRAOCULAR LENS PLACEMENT RIGHT EYE;  Surgeon: Baruch Goldmann, MD;  Location: [facility];  Service: Ophthalmology;  Laterality: Right;  CDE: 2.91   . CATARACT EXTRACTION W/PHACO Left 04/18/2017   Procedure: CATARACT EXTRACTION PHACO AND INTRAOCULAR LENS PLACEMENT LEFT EYE;  Surgeon: Baruch Goldmann, MD;  Location: [facility];  Service: Ophthalmology;  Laterality: Left;  left  . CHOLECYSTECTOMY    . COLONOSCOPY WITH PROPOFOL N/A 10/04/2016   Procedure: COLONOSCOPY WITH PROPOFOL;  Surgeon: Rogene Houston, MD;  [facility];  Service: Endoscopy;  Laterality: N/A;  11:10  . CORONARY ARTERY BYPASS GRAFT N/A 08/29/2017   Procedure: CORONARY ARTERY BYPASS GRAFTING (CABG) x 3 WITH ENDOSCOPIC HARVESTING OF RIGHT SAPHENOUS VEIN;  Surgeon: Ivin Poot, MD;   Location: [facility];  Service: Open Heart Surgery;  Laterality: N/A;  . ESOPHAGOGASTRODUODENOSCOPY N/A 04/25/2016   Procedure: ESOPHAGOGASTRODUODENOSCOPY (EGD);  Surgeon: Rogene Houston, MD;  [facility];  Service: Endoscopy;  Laterality: N/A;  730  . LEFT HEART CATH AND CORONARY ANGIOGRAPHY N/A 08/27/2017   Procedure: LEFT HEART CATH AND CORONARY ANGIOGRAPHY;  Surgeon: Sherren Mocha, MD;  Location: [facility];  Service: Cardiovascular::  pLAD 95% - p-mLAD 50%, ostD1 90% -pD1 80%; ostOM1 90%; rPDA 80%. EF ~50-55% - HK of dital Anterolateral & Apical wall.  - Rec CVTS c/s  . RECTAL SURGERY     fissure  . SHOULDER SURGERY Left    arthroscopy in March of this year  . TEE WITHOUT CARDIOVERSION N/A 08/29/2017   Procedure: TRANSESOPHAGEAL ECHOCARDIOGRAM (TEE);  Surgeon: Prescott Gum, Collier Salina, MD;  Location: [facility];  Service: Open Heart Surgery;  Laterality: N/A;  . TRANSTHORACIC ECHOCARDIOGRAM  06/06/2017   Mild to moderate reduced EF 40 and 45%.  Anterior septal, inferoseptal and basal to mid inferior hypokinesis.  GR 1 DD.  No significant valvular lesion   Social History   Socioeconomic History  . Marital status: Single    Spouse name: Not on file  . Number of children: 0  . Years of education: 15  . Highest education level: Not on file  Occupational History  Employer: [employer]  Social Needs  . Financial resource strain: Not on file  . Food insecurity:    Worry: Not on file    Inability: Not on file  . Transportation needs:    Medical: Not on file    Non-medical: Not on file  Tobacco Use  . Smoking status: Former Smoker    Types: Cigarettes    Last attempt to quit: 02/04/1971    Years since quitting: 47.3  . Smokeless tobacco: Never Used  . Tobacco comment: smoked 2 cigarettes a day  Substance and Sexual Activity  . Alcohol use: No    Alcohol/week: 0.0 standard drinks  . Drug use: No  . Sexual activity: Never  Lifestyle  . Physical activity:    Days  per week: Not on file    Minutes per session: Not on file  . Stress: Not on file  Relationships  . Social connections:    Talks on phone: Not on file    Gets together: Not on file    Attends religious service: Not on file    Active member of club or organization: Not on file    Attends meetings of clubs or organizations: Not on file    Relationship status: Not on file  Other Topics Concern  . Not on file  Social History Narrative   Patient lives at home alone.    Patient has no children.    Patient has her masters in nursing.    Patient is single.    Patient drinks about 2 glasses of tea daily.   Patient is right handed.   Outpatient Encounter Medications as of 06/08/2018  Medication Sig  . albuterol (PROVENTIL HFA;VENTOLIN HFA) 108 (90 Base) MCG/ACT inhaler Inhale 2 puffs into the lungs every 6 (six) hours as needed for wheezing or shortness of breath.  [name] aspirin EC 81 MG tablet Take 1 tablet (81 mg total) by mouth daily.  . baclofen (LIORESAL) 10 MG tablet TAKE ONE TABLET BY MOUTH AT BEDTIME.  [name] buPROPion (WELLBUTRIN XL) 300 MG 24 hr tablet TAKE ONE TABLET BY MOUTH EVERY MORNING.  . calcium carbonate (TUMS - DOSED IN MG ELEMENTAL CALCIUM) 500 MG chewable tablet Chew 2 tablets by mouth as needed for indigestion or heartburn.   . carvedilol (COREG) 6.25 MG tablet Take 1 tablet (6.25 mg total) by mouth 2 (two) times daily.  [name] EPINEPHrine (EPIPEN 2-PAK) 0.3 mg/0.3 mL IJ SOAJ injection Inject 0.3 mg into the muscle once.  [name] esomeprazole (NEXIUM) 40 MG capsule Take 40 mg by mouth 2 (two) times daily.  [name] gabapentin (NEURONTIN) 300 MG capsule Take 1 capsule (300 mg total) by mouth 2 (two) times daily.  . hydrOXYzine (ATARAX/VISTARIL) 10 MG tablet Take 1 tablet (10 mg total) by mouth 3 (three) times daily as needed for itching.  . Insulin Glargine (TOUJEO SOLOSTAR) 300 UNIT/ML SOPN Inject 50 Units into the skin at bedtime.  . Lactase (LACTAID FAST ACT) 9000 units TABS Take by mouth. Patient  states that she takes as needed.  . lamoTRIgine (LAMICTAL) 150 MG tablet TAKE ONE TABLET BY MOUTH AT BEDTIME.  [name] levothyroxine (SYNTHROID, LEVOTHROID) 100 MCG tablet Take 100 mcg by mouth daily before breakfast.   . meclizine (ANTIVERT) 25 MG tablet Take 1 tablet (25 mg total) by mouth 3 (three) times daily as needed for dizziness. (Patient taking differently: Take 25 mg by mouth daily as needed for dizziness. )  . metFORMIN (GLUCOPHAGE) 1000 MG tablet Take 1,000 mg  by mouth 2 (two) times daily with a meal.  . Multiple Vitamin (MULTIVITAMIN) capsule Take 1 capsule by mouth daily.  . Polyvinyl Alcohol-Povidone (REFRESH OP) Place 2 drops into both eyes daily as needed (for dry eyes).  . potassium chloride SA (K-DUR,KLOR-CON) 20 MEQ tablet Take 1 tablet (20 mEq total) by mouth daily.  [name] rOPINIRole (REQUIP) 2 MG tablet Take 2 mg by mouth 3 (three) times daily. .  . torsemide (DEMADEX) 20 MG tablet Take one tablet (20mg ) alternating days with (40mg ) daily  . traMADol (ULTRAM) 50 MG tablet Take 100 mg by mouth 4 (four) times daily as needed.   . traZODone (DESYREL) 50 MG tablet Take 50 mg by mouth at bedtime.  [name] venlafaxine XR (EFFEXOR-XR) 150 MG 24 hr capsule TAKE TWO (2) CAPSULES BY MOUTH AT BEDTIME. (Patient taking differently: TAKE 300 MG BY MOUTH AT BEDTIME)  . losartan (COZAAR) 25 MG tablet Take 1 tablet (25 mg total) by mouth daily.  . pravastatin (PRAVACHOL) 20 MG tablet Take 1 tablet (20 mg total) by mouth every evening.   No facility-administered encounter medications on file as of 06/08/2018.    ALLERGIES: Allergies  Allergen Reactions  . Amoxicillin Anaphylaxis and Other (See Comments)    Has patient had a PCN reaction causing immediate rash, facial/tongue/throat swelling, SOB or lightheadedness with hypotension: Yes Has patient had a PCN reaction causing severe rash involving mucus membranes or skin necrosis: Yes Has patient had a PCN reaction that required hospitalization: Yes Has  patient had a PCN reaction occurring within the last 10 years: Yes If all of the above answers are "NO", then may proceed with Cephalosporin use.   [name] Hydrocodone Anaphylaxis  . Vancomycin Itching  . Levaquin [Levofloxacin] Rash  . Depacon [Valproic Acid] Other (See Comments)    Causes falls    VACCINATION STATUS: Immunization History  Administered Date(s) Administered  . Influenza,inj,Quad PF,6+ Mos 01/20/2018    Diabetes  She presents for her follow-up diabetic visit. She has type 2 diabetes mellitus. Onset time: She was diagnosed at approximate age of 55 years. Her disease course has been improving. There are no hypoglycemic associated symptoms. Pertinent negatives for hypoglycemia include no confusion, headaches, pallor or seizures. Associated symptoms include fatigue. Pertinent negatives for diabetes include no chest pain, no polydipsia, no polyphagia and no polyuria. There are no hypoglycemic complications. Symptoms are improving. Diabetic complications include heart disease. (Since her last visit, she underwent coronary artery bypass graft for 3 coronary vessels.) Risk factors for coronary artery disease include diabetes mellitus, dyslipidemia, hypertension, obesity and sedentary lifestyle. Current diabetic treatment includes oral agent (dual therapy) and insulin injections. She is compliant with treatment most of the time. Her weight is stable. She is following a generally unhealthy diet. She has not had a previous visit with a dietitian. She never participates in exercise. Her breakfast blood glucose range is generally 140-180 mg/dl. Her overall blood glucose range is 140-180 mg/dl. An ACE inhibitor/angiotensin II receptor blocker is being taken. Eye exam is current.  Hypertension  This is a chronic problem. The current episode started more than 1 year ago. Pertinent negatives include no chest pain, headaches, palpitations or shortness of breath. Past treatments include ACE inhibitors.  Compliance problems include diet, exercise, medication cost, medication side effects and psychosocial issues.  Identifiable causes of hypertension include a thyroid problem.  Hyperlipidemia  This is a chronic problem. The current episode started more than 1 year ago. Pertinent negatives include no chest pain, myalgias or  shortness of breath. Current antihyperlipidemic treatment includes statins. Risk factors for coronary artery disease include a sedentary lifestyle, obesity, hypertension, diabetes mellitus and dyslipidemia.  Thyroid Problem  Presents for follow-up visit. Symptoms include fatigue. Patient reports no cold intolerance, diarrhea, heat intolerance or palpitations. The symptoms have been stable. Past treatments include levothyroxine. Her past medical history is significant for hyperlipidemia.     Review of Systems  Constitutional: Positive for fatigue. Negative for unexpected weight change.  HENT: Negative for trouble swallowing and voice change.   Eyes: Negative for visual disturbance.  Respiratory: Negative for cough, shortness of breath and wheezing.   Cardiovascular: Negative for chest pain, palpitations and leg swelling.  Gastrointestinal: Negative for diarrhea, nausea and vomiting.  Endocrine: Negative for cold intolerance, heat intolerance, polydipsia, polyphagia and polyuria.  Musculoskeletal: Negative for arthralgias and myalgias.  Skin: Negative for color change, pallor, rash and wound.  Neurological: Negative for seizures and headaches.  Psychiatric/Behavioral: Negative for confusion and suicidal ideas.    Objective:    BP 129/84 (BP Location: Left Arm, Patient Position: Sitting, Cuff Size: Large)   Pulse 88   Temp 98.3 F (36.8 C) (Oral)   Ht 5\' 4"  (1.626 m)   Wt 251 lb (113.9 kg)   SpO2 96%   BMI 43.08 kg/m   Wt Readings from Last 3 Encounters:  06/08/18 251 lb (113.9 kg)  02/19/18 265 lb (120.2 kg)  02/18/18 268 lb 12.8 oz (121.9 kg)    Physical Exam   Constitutional: She is oriented to person, place, and time. She appears well-developed.  HENT:  Head: Normocephalic and atraumatic.  Eyes: EOM are normal.  Neck: Normal range of motion. Neck supple. No tracheal deviation present. No thyromegaly present.  Cardiovascular: Normal rate and regular rhythm.  Pulmonary/Chest: Effort normal and breath sounds normal.  Abdominal: Soft. Bowel sounds are normal. There is no abdominal tenderness. There is no guarding.  Musculoskeletal: Normal range of motion.        General: No edema.  Neurological: She is alert and oriented to person, place, and time. She has normal reflexes. No cranial nerve deficit. Coordination normal.  Skin: Skin is warm and dry. No rash noted. No erythema. No pallor.  Psychiatric: She has a normal mood and affect. Judgment normal.    CMP Latest Ref Rng & Units 04/24/2018 01/29/2018 09/05/2017  Glucose 65 - 99 mg/dL - 163(H) 134(H)  BUN 4 - 21 13 20 11   Creatinine 0.5 - 1.1 0.9 1.10(H) 0.87  Sodium 135 - 146 mmol/L - 137 139  Potassium 3.5 - 5.3 mmol/L - 4.2 4.9  Chloride 98 - 110 mmol/L - 91(L) 101  CO2 20 - 32 mmol/L - 33(H) 30  Calcium 8.6 - 10.4 mg/dL - 8.5(L) 8.9  Total Protein 6.1 - 8.1 g/dL - 7.1 6.1(L)  Total Bilirubin 0.2 - 1.2 mg/dL - 0.2 0.6  Alkaline Phos 38 - 126 U/L - - 53  AST 10 - 35 U/L - 11 12(L)  ALT 6 - 29 U/L - 12 14   Lipid Panel     Component Value Date/Time   CHOL 176 04/24/2018   TRIG 114 04/24/2018   HDL 59 04/24/2018   CHOLHDL 3.4 07/10/2016 0934   VLDL 31 (H) 07/10/2016 0934   LDLCALC 96 04/24/2018     Diabetic Labs (most recent): Lab Results  Component Value Date   HGBA1C 7.2 04/24/2018   HGBA1C 7.7 (H) 01/29/2018   HGBA1C 7.2 (H) 08/28/2017    Assessment &  Plan:   1. Uncontrolled type 2 diabetes mellitus with coronary artery disease status post coronary artery bypass graft   Her diabetes is  complicated by coronary artery disease, neuropathy and patient remains at a high  risk for more acute and chronic complications of diabetes which include CAD, CVA, CKD, retinopathy, and neuropathy. These are all discussed in detail with the patient.  - she returns with improving glycemic profile and improved A1c of 7.2%.    - I have re-counseled the patient on diet management and weight loss  by adopting a carbohydrate restricted / protein rich  Diet.  - Patient admits there is a room for improvement in her diet and drink choices. -  Suggestion is made for her to avoid simple carbohydrates  from her diet including Cakes, Sweet Desserts / Pastries, Ice Cream, Soda (diet and regular), Sweet Tea, Candies, Chips, Cookies, Store Bought Juices, Alcohol in Excess of  1-2 drinks a day, Artificial Sweeteners, and "Sugar-free" Products. This will help patient to have stable blood glucose profile and potentially avoid unintended weight gain.  - Patient is advised to stick to a routine mealtimes to eat 3 meals  a day and avoid unnecessary snacks ( to snack only to correct hypoglycemia).  - I have approached patient with the following individualized plan to manage diabetes and patient agrees.  -Based on her presentation with near target glycemia and A1c of 7.2%, she will not require prandial insulin for now.   -She is advised to continue Toujeo 50 units nightly, associated with monitoring of blood glucose twice a day-daily before breakfast and at bedtime.    -He is advised to continue metformin 1000 mg by mouth twice a day   with meals and discontinue  Invokana.  - Patient will be considered for incretin therapy as appropriate next visit. - Patient specific target  for A1c; LDL, HDL, Triglycerides, and  Waist Circumference were discussed in detail.  2) BP/HTN: Her blood pressure is controlled to target.  -She is advised to continue on her current medications including Lasix, metoprolol.  3) Lipids/HPL: Her recent lipid panel showed LDL uncontrolled at 83.  She is advised to continue  Lipitor 10 mg p.o. daily.    4)  Weight/Diet: CDE consult in progress, exercise, and carbohydrates information provided.  5) Hypothyroidism  - Her previsit thyroid function tests are consistent with appropriate replacement.    She is now on amiodarone 200 mg p.o. twice daily. -She is advised to continue levothyroxine 100 mcg p.o. every morning.    - We discussed about the correct intake of her thyroid hormone, on empty stomach at fasting, with water, separated by at least 30 minutes from breakfast and other medications,  and separated by more than 4 hours from calcium, iron, multivitamins, acid reflux medications (PPIs). -Patient is made aware of the fact that thyroid hormone replacement is needed for life, dose to be adjusted by periodic monitoring of thyroid function tests.  6) Chronic Care/Health Maintenance:  -Patient is on ACEI/ARB and Statin medications and encouraged to continue to follow up with Ophthalmology, Podiatrist at least yearly or according to recommendations, and advised to  stay away from smoking. I have recommended yearly flu vaccine and pneumonia vaccination at least every 5 years; moderate intensity exercise for up to 150 minutes weekly; and  sleep for at least 7 hours a day.  I advised patient to maintain close follow up with her PCP for primary care needs. - Time spent with the patient: 25  min, of which >50% was spent in reviewing her blood glucose logs , discussing her hypoglycemia and hyperglycemia episodes, reviewing her current and  previous labs / studies and medications  doses and developing a plan to avoid hypoglycemia and hyperglycemia. Please refer to Patient Instructions for Blood Glucose Monitoring and Insulin/Medications Dosing Guide"  in media tab for additional information. Norma [name] participated in the discussions, expressed understanding, and voiced agreement with the above plans.  All questions were answered to her satisfaction. she is encouraged to  contact clinic should she have any questions or concerns prior to her return visit.   Follow up plan: Return in about 4 months (around 10/07/2018) for Meter, and Logs, Follow up with Pre-visit Labs, Meter, and Logs.  Glade Lloyd, MD Phone: [phone]  Fax: [phone]  This note was partially dictated with voice recognition software. Similar sounding words can be transcribed inadequately or may not  be corrected upon review.  06/08/2018, 12:02 PM

## 2018-06-11 DIAGNOSIS — G4733 Obstructive sleep apnea (adult) (pediatric): Secondary | ICD-10-CM | POA: Diagnosis not present

## 2018-06-11 DIAGNOSIS — J449 Chronic obstructive pulmonary disease, unspecified: Secondary | ICD-10-CM | POA: Diagnosis not present

## 2018-06-11 DIAGNOSIS — E1169 Type 2 diabetes mellitus with other specified complication: Secondary | ICD-10-CM | POA: Diagnosis not present

## 2018-06-11 DIAGNOSIS — F319 Bipolar disorder, unspecified: Secondary | ICD-10-CM | POA: Diagnosis not present

## 2018-06-18 DIAGNOSIS — B351 Tinea unguium: Secondary | ICD-10-CM | POA: Diagnosis not present

## 2018-06-18 DIAGNOSIS — E1142 Type 2 diabetes mellitus with diabetic polyneuropathy: Secondary | ICD-10-CM | POA: Diagnosis not present

## 2018-06-18 DIAGNOSIS — L84 Corns and callosities: Secondary | ICD-10-CM | POA: Diagnosis not present

## 2018-06-18 DIAGNOSIS — M79676 Pain in unspecified toe(s): Secondary | ICD-10-CM | POA: Diagnosis not present

## 2018-07-20 ENCOUNTER — Telehealth (INDEPENDENT_AMBULATORY_CARE_PROVIDER_SITE_OTHER): Payer: Self-pay | Admitting: *Deleted

## 2018-07-20 ENCOUNTER — Encounter (INDEPENDENT_AMBULATORY_CARE_PROVIDER_SITE_OTHER): Payer: Self-pay

## 2018-07-20 ENCOUNTER — Encounter (INDEPENDENT_AMBULATORY_CARE_PROVIDER_SITE_OTHER): Payer: Self-pay | Admitting: *Deleted

## 2018-07-20 NOTE — Telephone Encounter (Signed)
June I forgot the day at 11 will be fine  ----- Message -----  From: Nurse Ronnette Juniper T  Sent: 07/20/2018 9:52 AM EDT  To: [name]  Subject: Review Previous message  [name] ,     Please review previous messages sent to you , about an appointment.    Reesha Debes

## 2018-08-07 ENCOUNTER — Other Ambulatory Visit (HOSPITAL_COMMUNITY): Payer: Self-pay | Admitting: Pulmonary Disease

## 2018-08-07 ENCOUNTER — Ambulatory Visit (HOSPITAL_COMMUNITY)
Admission: RE | Admit: 2018-08-07 | Discharge: 2018-08-07 | Disposition: A | Discharge status: Home / Self Care | Payer: Medicare Other | Source: Ambulatory Visit | Attending: Pulmonary Disease | Admitting: Pulmonary Disease

## 2018-08-07 ENCOUNTER — Other Ambulatory Visit: Payer: Self-pay | Admitting: Pulmonary Disease

## 2018-08-07 ENCOUNTER — Telehealth: Payer: Self-pay | Admitting: Cardiology

## 2018-08-07 ENCOUNTER — Other Ambulatory Visit: Payer: Self-pay

## 2018-08-07 DIAGNOSIS — M7989 Other specified soft tissue disorders: Secondary | ICD-10-CM | POA: Diagnosis not present

## 2018-08-07 DIAGNOSIS — M79662 Pain in left lower leg: Secondary | ICD-10-CM | POA: Insufficient documentation

## 2018-08-07 DIAGNOSIS — R2242 Localized swelling, mass and lump, left lower limb: Secondary | ICD-10-CM | POA: Diagnosis not present

## 2018-08-07 NOTE — Telephone Encounter (Signed)
Virtual Visit Pre-Appointment Phone Call  "(Name), I am calling you today to discuss your upcoming appointment. We are currently trying to limit exposure to the virus that causes COVID-19 by seeing patients at home rather than in the office."  1. "What is the BEST phone number to call the day of the visit?" - include this in appointment notes  2. Do you have or have access to (through a family member/friend) a smartphone with video capability that we can use for your visit?" a. If yes - list this number in appt notes as cell (if different from BEST phone #) and list the appointment type as a VIDEO visit in appointment notes b. If no - list the appointment type as a PHONE visit in appointment notes  3. Confirm consent - "In the setting of the current Covid19 crisis, you are scheduled for a (phone or video) visit with your provider on (date) at (time).  Just as we do with many in-office visits, in order for you to participate in this visit, we must obtain consent.  If you'd like, I can send this to your mychart (if signed up) or email for you to review.  Otherwise, I can obtain your verbal consent now.  All virtual visits are billed to your insurance company just like a normal visit would be.  By agreeing to a virtual visit, we'd like you to understand that the technology does not allow for your provider to perform an examination, and thus may limit your provider's ability to fully assess your condition. If your provider identifies any concerns that need to be evaluated in person, we will make arrangements to do so.  Finally, though the technology is pretty good, we cannot assure that it will always work on either your or our end, and in the setting of a video visit, we may have to convert it to a phone-only visit.  In either situation, we cannot ensure that we have a secure connection.  Are you willing to proceed?" STAFF: Did the patient verbally acknowledge consent to telehealth visit? Document  YES/NO here: yes  4. Advise patient to be prepared - "Two hours prior to your appointment, go ahead and check your blood pressure, pulse, oxygen saturation, and your weight (if you have the equipment to check those) and write them all down. When your visit starts, your provider will ask you for this information. If you have an Apple Watch or Kardia device, please plan to have heart rate information ready on the day of your appointment. Please have a pen and paper handy nearby the day of the visit as well."  5. Give patient instructions for MyChart download to smartphone OR Doximity/[email or web address].me as below if video visit (depending on what platform provider is using)  6. Inform patient they will receive a phone call 15 minutes prior to their appointment time (may be from unknown caller ID) so they should be prepared to answer    TELEPHONE CALL NOTE  Norma [name] has been deemed a candidate for a follow-up tele-health visit to limit community exposure during the Covid-19 pandemic. I spoke with the patient via phone to ensure availability of phone/video source, confirm preferred email & phone number, and discuss instructions and expectations.  I reminded Norma [name] to be prepared with any vital sign and/or heart rhythm information that could potentially be obtained via home monitoring, at the time of her visit. I reminded GERALDA BAUMGARDNER to expect a phone call prior to  her visit.  Weston Anna 08/07/2018 12:38 PM   INSTRUCTIONS FOR DOWNLOADING THE MYCHART APP TO SMARTPHONE  - The patient must first make sure to have activated MyChart and know their login information - If Apple, go to [employer] and type in MyChart in the search bar and download the app. If Android, ask patient to go to [employer] and type in [place] in the search bar and download the app. The app is free but as with any other app downloads, their phone may require them to verify saved payment information or  Apple/Android password.  - The patient will need to then log into the app with their MyChart username and password, and select [facility] as their healthcare provider to link the account. When it is time for your visit, go to the MyChart app, find appointments, and click Begin Video Visit. Be sure to Select Allow for your device to access the Microphone and Camera for your visit. You will then be connected, and your provider will be with you shortly.  **If they have any issues connecting, or need assistance please contact MyChart service desk [phone])**  **If using a computer, in order to ensure the best quality for their visit they will need to use either of the following Internet Browsers: [employer], or Google Chrome**  IF USING DOXIMITY or DOXY.ME - The patient will receive a link just prior to their visit by text.     FULL LENGTH CONSENT FOR TELE-HEALTH VISIT   I hereby voluntarily request, consent and authorize [facility] and its employed or contracted physicians, physician assistants, nurse practitioners or other licensed health care professionals (the Practitioner), to provide me with telemedicine health care services (the Services") as deemed necessary by the treating Practitioner. I acknowledge and consent to receive the Services by the Practitioner via telemedicine. I understand that the telemedicine visit will involve communicating with the Practitioner through live audiovisual communication technology and the disclosure of certain medical information by electronic transmission. I acknowledge that I have been given the opportunity to request an in-person assessment or other available alternative prior to the telemedicine visit and am voluntarily participating in the telemedicine visit.  I understand that I have the right to withhold or withdraw my consent to the use of telemedicine in the course of my care at any time, without affecting my right to future care  or treatment, and that the Practitioner or I may terminate the telemedicine visit at any time. I understand that I have the right to inspect all information obtained and/or recorded in the course of the telemedicine visit and may receive copies of available information for a reasonable fee.  I understand that some of the potential risks of receiving the Services via telemedicine include:   Delay or interruption in medical evaluation due to technological equipment failure or disruption;  Information transmitted may not be sufficient (e.g. poor resolution of images) to allow for appropriate medical decision making by the Practitioner; and/or   In rare instances, security protocols could fail, causing a breach of personal health information.  Furthermore, I acknowledge that it is my responsibility to provide information about my medical history, conditions and care that is complete and accurate to the best of my ability. I acknowledge that Practitioner's advice, recommendations, and/or decision may be based on factors not within their control, such as incomplete or inaccurate data provided by me or distortions of diagnostic images or specimens that may result from electronic transmissions. I  understand that the practice of medicine is not an exact science and that Practitioner makes no warranties or guarantees regarding treatment outcomes. I acknowledge that I will receive a copy of this consent concurrently upon execution via email to the email address I last provided but may also request a printed copy by calling the office of [facility].    I understand that my insurance will be billed for this visit.   I have read or had this consent read to me.  I understand the contents of this consent, which adequately explains the benefits and risks of the Services being provided via telemedicine.   I have been provided ample opportunity to ask questions regarding this consent and the Services and have had  my questions answered to my satisfaction.  I give my informed consent for the services to be provided through the use of telemedicine in my medical care  By participating in this telemedicine visit I agree to the above.

## 2018-08-14 ENCOUNTER — Telehealth (INDEPENDENT_AMBULATORY_CARE_PROVIDER_SITE_OTHER): Payer: Medicare Other | Admitting: Cardiology

## 2018-08-14 ENCOUNTER — Encounter: Payer: Self-pay | Admitting: Cardiology

## 2018-08-14 VITALS — Ht 64.0 in | Wt 243.0 lb

## 2018-08-14 DIAGNOSIS — I10 Essential (primary) hypertension: Secondary | ICD-10-CM

## 2018-08-14 DIAGNOSIS — I5032 Chronic diastolic (congestive) heart failure: Secondary | ICD-10-CM

## 2018-08-14 DIAGNOSIS — I2510 Atherosclerotic heart disease of native coronary artery without angina pectoris: Secondary | ICD-10-CM

## 2018-08-14 DIAGNOSIS — E782 Mixed hyperlipidemia: Secondary | ICD-10-CM

## 2018-08-14 MED ORDER — PRAVASTATIN SODIUM 40 MG PO TABS: 40.0000 mg | ORAL_TABLET | Freq: Every evening | ORAL | 1 refills | Status: DC

## 2018-08-14 NOTE — Progress Notes (Signed)
Virtual Visit via Telephone Note   This visit type was conducted due to national recommendations for restrictions regarding the COVID-19 Pandemic (e.g. social distancing) in an effort to limit this patient's exposure and mitigate transmission in our community.  Due to her co-morbid illnesses, this patient is at least at moderate risk for complications without adequate follow up.  This format is felt to be most appropriate for this patient at this time.  The patient did not have access to video technology/had technical difficulties with video requiring transitioning to audio format only (telephone).  All issues noted in this document were discussed and addressed.  No physical exam could be performed with this format.  Please refer to the patient's chart for her  consent to telehealth for [facility].   Date:  08/14/2018   ID:  Norma [name], DOB June 21, 1950, MRN [id number]  Patient Location: Home Provider Location: Home  PCP:  Sinda Du, MD  Cardiologist:  Carlyle Dolly, MD  Electrophysiologist:  None   Evaluation Performed:  Follow-Up Visit  Chief Complaint: 4 month follow up  History of Present Illness:    Norma [name] is a 68 y.o. female seen today for follow up of the following medical problems.   1. Chronic diastolic HF 50/9326 echo VLEF 71-24%, grade I diastolic dysfunction.  - last visit incrased torsemide to 40mg  x 3 days, then 20mg  daliy.  - reports on 40mg  daily significant weight loss down to 259 lbs, but when goes back to 20mg  daily regains weight.    - reported weight 243 lbs today.Taking lasix MWF 40mg , and 20mg  on other days - occasioal LE edema, Still some SOB at times, comes and goes. Overall significnat improvement since starting torsemide.     2. CAD -05/2017 echo LVEF 40-45% - 08/2017 cath for unstable angina: LAD 95%, D1 90%, RPDA 80%, OM 90%.  - CT surgery was consulted. 08/30/17 CABG with sequential SVG to diag and LAD, SVG-OM. -  stopped  lopressor due to fatigue.   - no recent chest pain    3. Chronic LBBB   4. Hyperlipidemia - off crestor currently due to muscle aches, stopped she in August. Symptoms mildly improved.  - tolerating prasvastatin without issues - Jan 2020 TC 176 HDL 59 TG 114 LDL 96     The patient does not have symptoms concerning for COVID-19 infection (fever, chills, cough, or new shortness of breath).    Past Medical History:  Diagnosis Date   Anemia    Anxiety    Bipolar disorder ([place])    Bulging lumbar disc    L3-4   Chronic daily headache    Chronic low back pain 09/20/2014   COPD (chronic obstructive pulmonary disease) (HCC)    Degenerative arthritis    Depression    Diabetes mellitus, type II ([place])    DM type 2 with diabetic peripheral neuropathy ([place]) 09/20/2014   Dyslipidemia    Dyspnea    Gastroparesis    GERD (gastroesophageal reflux disease)    Heart murmur    History of hiatal hernia    Hypothyroidism    IBS (irritable bowel syndrome)    Memory difficulty 09/20/2014   Morbid obesity ([place])    Neuropathy    Obstructive sleep apnea on CPAP    Restless legs syndrome (RLS) 09/17/2012   Stroke (cerebrum) ([place]) 05/16/2017   Left parietal   Past Surgical History:  Procedure Laterality Date   ABDOMINAL HYSTERECTOMY     ARTHROSCOPY KNEE W/  DRILLING  06/2011   and Decemer of 2013.   Carpel tunnel  1980's   CATARACT EXTRACTION W/PHACO Right 03/21/2017   Procedure: CATARACT EXTRACTION PHACO AND INTRAOCULAR LENS PLACEMENT RIGHT EYE;  Surgeon: Baruch Goldmann, MD;  Location: [facility];  Service: Ophthalmology;  Laterality: Right;  CDE: 2.91    CATARACT EXTRACTION W/PHACO Left 04/18/2017   Procedure: CATARACT EXTRACTION PHACO AND INTRAOCULAR LENS PLACEMENT LEFT EYE;  Surgeon: Baruch Goldmann, MD;  Location: [facility];  Service: Ophthalmology;  Laterality: Left;  left   CHOLECYSTECTOMY     COLONOSCOPY WITH PROPOFOL N/A 10/04/2016   Procedure: COLONOSCOPY WITH  PROPOFOL;  Surgeon: Rogene Houston, MD;  [facility];  Service: Endoscopy;  Laterality: N/A;  11:10   CORONARY ARTERY BYPASS GRAFT N/A 08/29/2017   Procedure: CORONARY ARTERY BYPASS GRAFTING (CABG) x 3 WITH ENDOSCOPIC HARVESTING OF RIGHT SAPHENOUS VEIN;  Surgeon: Ivin Poot, MD;  Location: [facility];  Service: Open Heart Surgery;  Laterality: N/A;   ESOPHAGOGASTRODUODENOSCOPY N/A 04/25/2016   Procedure: ESOPHAGOGASTRODUODENOSCOPY (EGD);  Surgeon: Rogene Houston, MD;  [facility];  Service: Endoscopy;  Laterality: N/A;  730   LEFT HEART CATH AND CORONARY ANGIOGRAPHY N/A 08/27/2017   Procedure: LEFT HEART CATH AND CORONARY ANGIOGRAPHY;  Surgeon: Sherren Mocha, MD;  Location: [facility];  Service: Cardiovascular::  pLAD 95% - p-mLAD 50%, ostD1 90% -pD1 80%; ostOM1 90%; rPDA 80%. EF ~50-55% - HK of dital Anterolateral & Apical wall.  - Rec CVTS c/s   RECTAL SURGERY     fissure   SHOULDER SURGERY Left    arthroscopy in March of this year   TEE WITHOUT CARDIOVERSION N/A 08/29/2017   Procedure: TRANSESOPHAGEAL ECHOCARDIOGRAM (TEE);  Surgeon: Prescott Gum, Collier Salina, MD;  Location: [facility];  Service: Open Heart Surgery;  Laterality: N/A;   TRANSTHORACIC ECHOCARDIOGRAM  06/06/2017   Mild to moderate reduced EF 40 and 45%.  Anterior septal, inferoseptal and basal to mid inferior hypokinesis.  GR 1 DD.  No significant valvular lesion     No outpatient medications have been marked as taking for the 08/14/18 encounter (Appointment) with Arnoldo Lenis, MD.     Allergies:   Amoxicillin; Hydrocodone; Vancomycin; Levaquin [levofloxacin]; and Depacon [valproic acid]   Social History   Tobacco Use   Smoking status: Former Smoker    Types: Cigarettes    Last attempt to quit: 02/04/1971    Years since quitting: 47.5   Smokeless tobacco: Never Used   Tobacco comment: smoked 2 cigarettes a day  Substance Use Topics   Alcohol use: No    Alcohol/week: 0.0 standard  drinks   Drug use: No     Family Hx: The patient's family history includes Alcohol abuse in her brother, father, paternal grandfather, paternal grandmother, and paternal uncle; Arthritis in her father; Cancer in her brother; Depression in her mother; Hypertension in her mother and sister; Lymphoma in her mother.  ROS:   Please see the history of present illness.     All other systems reviewed and are negative.   Prior CV studies:   The following studies were reviewed today:  03/2016 Lexiscan  Probable normal perfusion and mild soft tissue attenuation (breast) No significant ischemia or evidence for scar  The left ventricular ejection fraction is normal (55-65%).  Low risk scan.  08/2017 cath  RPDA lesion is 80% stenosed.  Ost 1st Mrg to 1st Mrg lesion is 90% stenosed.  Prox LAD lesion is 95% stenosed.  Prox LAD to Mid LAD lesion is 50% stenosed.  Ost 1st Diag lesion is 90% stenosed.  1st Diag lesion is 80% stenosed.  The left ventricular ejection fraction is 50-55% by visual estimate.  There is mild left ventricular systolic dysfunction.  1. Severe multivessel coronary artery disease with severe stenosis of the proximal LAD/first diagonal bifurcation, first obtuse marginal Amiir Heckard of the circumflex, and PDA Chayce Robbins of the RCA. 2. Mild segmental LV systolic dysfunction with hypokinesis of the distal anterolateral and apical walls, LVEF estimated at 50 to 55%.  08/2017 TEE  Aortic valve: The valve is trileaflet. No stenosis. No regurgitation.  Mitral valve: Mild regurgitation. Central jet.  Right ventricle: Normal cavity size, wall thickness and ejection fraction.  Pericardium: Trivial pericardial effusion.  Tricuspid valve: No regurgitation  Pulmonic valve: No regurgitation by color doppler.  Left ventricle: Regional wall motion abnormalities present, dyskinetic basal and apical septal wall, hypokinesis of lateral wall and inferoseptal wall. Increased wall  thickness. LVEF 35-40%.  No ASD/PFO present  Unable to rule out thrombus in LAA but velocities make thrombus less likely.  05/2017 echo Study Conclusions  - Left ventricle: The cavity size was normal. There was mild concentric hypertrophy. Systolic function was mildly to moderately reduced. The estimated ejection fraction was in the range of 40% to 45%. Hypokinesis of the anteroseptal, inferoseptal and basal to mid inferior myocardium. Doppler parameters are consistent with abnormal left ventricular relaxation (grade 1 diastolic dysfunction). Doppler parameters are consistent with indeterminate ventricular filling pressure. - Aortic valve: Transvalvular velocity was within the normal range. There was no stenosis. There was no regurgitation. - Mitral valve: Transvalvular velocity was within the normal range. There was no evidence for stenosis. There was no regurgitation. - Right ventricle: The cavity size was normal. Wall thickness was normal. Systolic function was normal. - Tricuspid valve: There was trivial regurgitation. - Global longitudinal strain -11.9%.   01/2018 echo Study Conclusions  - Left ventricle: The cavity size was normal. Wall thickness was increased in a pattern of mild LVH. Systolic function was normal. The estimated ejection fraction was in the range of 50% to 55%. Regional wall motion abnormalities cannot be excluded. Doppler parameters are consistent with abnormal left ventricular relaxation (grade 1 diastolic dysfunction). - Ventricular septum: Septal motion showed abnormal function and dyssynergy. These changes are consistent with a left bundle Chandra Feger block. - Mitral valve: Mildly thickened leaflets .  Labs/Other Tests and Data Reviewed:    EKG:  na  Recent Labs: 08/30/2017: Magnesium 2.0 09/05/2017: Platelets 190 01/29/2018: ALT 12; Hemoglobin 11.2; Potassium 4.2; Sodium 137 04/24/2018: BUN 13; Creatinine 0.9;  TSH 3.05   Recent Lipid Panel Lab Results  Component Value Date/Time   CHOL 176 04/24/2018   TRIG 114 04/24/2018   HDL 59 04/24/2018   CHOLHDL 3.4 07/10/2016 09:34 AM   LDLCALC 96 04/24/2018    Wt Readings from Last 3 Encounters:  06/08/18 251 lb (113.9 kg)  02/19/18 265 lb (120.2 kg)  02/18/18 268 lb 12.8 oz (121.9 kg)     Objective:    Vital Signs:   Today's Vitals   08/14/18 1121  Weight: 243 lb (110.2 kg)  Height: 5\' 4"  (1.626 m)   Body mass index is 41.71 kg/m.  Normal affect. Normal speech pattern and tone. Comfortable, no apparent distress. No audible signs of SOB or wheezing.   ASSESSMENT & PLAN:    1.Chronic diastolic HF - echo shows LVEF has normalized, does have grade I diastolic dysfunction - improving  edema and weights since changing to torsemide, continue current meds  2.CAD - no recent chest pain symptoms - beta blocker stopped due to fatigue.  - she will conitnue current meds   3. Hyperlipidemia - chronic leg pains worst on crestor,tolerating pravastatin - LDL not at goal, increase pravastatin to 40mg  daily.    COVID-19 Education: The signs and symptoms of COVID-19 were discussed with the patient and how to seek care for testing (follow up with PCP or arrange E-visit).  The importance of social distancing was discussed today.  Time:   Today, I have spent 18 minutes with the patient with telehealth technology discussing the above problems.     Medication Adjustments/Labs and Tests Ordered: Current medicines are reviewed at length with the patient today.  Concerns regarding medicines are outlined above.   Tests Ordered: No orders of the defined types were placed in this encounter.   Medication Changes: No orders of the defined types were placed in this encounter.   Disposition:  Follow up 6 months  Signed, Carlyle Dolly, MD  08/14/2018 8:12 AM    [place]

## 2018-08-14 NOTE — Patient Instructions (Addendum)
Your physician wants you to follow-up in: [place] will receive a reminder letter in the mail two months in advance. If you don't receive a letter, please call our office to schedule the follow-up appointment.  Your physician has recommended you make the following change in your medication:   INCREASE PRAVASTATIN 40 MG DAILY  Your physician recommends that you return for lab work MAGNESIUM - WILL BE ADDED TO ORDERS   Thank you for choosing [facility]!!

## 2018-09-03 ENCOUNTER — Ambulatory Visit: Payer: Medicare Other | Admitting: Orthopaedic Surgery

## 2018-09-09 ENCOUNTER — Ambulatory Visit: Payer: Medicare Other | Admitting: Orthopaedic Surgery

## 2018-09-09 ENCOUNTER — Other Ambulatory Visit: Payer: Self-pay

## 2018-09-10 ENCOUNTER — Telehealth: Payer: Self-pay | Admitting: Nurse Practitioner

## 2018-09-10 DIAGNOSIS — G2581 Restless legs syndrome: Secondary | ICD-10-CM | POA: Diagnosis not present

## 2018-09-10 DIAGNOSIS — G4733 Obstructive sleep apnea (adult) (pediatric): Secondary | ICD-10-CM | POA: Diagnosis not present

## 2018-09-10 DIAGNOSIS — J449 Chronic obstructive pulmonary disease, unspecified: Secondary | ICD-10-CM | POA: Diagnosis not present

## 2018-09-10 DIAGNOSIS — E1165 Type 2 diabetes mellitus with hyperglycemia: Secondary | ICD-10-CM | POA: Diagnosis not present

## 2018-09-10 NOTE — Telephone Encounter (Signed)
5-28-20difficulty walking,headaches and falling often Pt has given consent to file insurance for [email or web address] vv E-mail:[email or web address]  Pt understands that although there may be some limitations with this type of visit, we will take all precautions to reduce any security or privacy concerns.  Pt understands that this will be treated like an in office visit and we will file with pt's insurance, and there may be a patient responsible charge related to this service. *email sent*

## 2018-09-15 NOTE — Telephone Encounter (Signed)
I reached out to the pt and I was able to complete pre charting for 09/16/18 visit. Pt sated she would prefer to do a telephone visit instead of a virtual visit.  Appt has been converted to telephone, best cb # is [phone].

## 2018-09-16 ENCOUNTER — Ambulatory Visit (INDEPENDENT_AMBULATORY_CARE_PROVIDER_SITE_OTHER): Payer: Medicare Other | Admitting: Neurology

## 2018-09-16 ENCOUNTER — Encounter: Payer: Self-pay | Admitting: Orthopaedic Surgery

## 2018-09-16 ENCOUNTER — Encounter: Payer: Self-pay | Admitting: Neurology

## 2018-09-16 ENCOUNTER — Other Ambulatory Visit: Payer: Self-pay

## 2018-09-16 ENCOUNTER — Ambulatory Visit (INDEPENDENT_AMBULATORY_CARE_PROVIDER_SITE_OTHER): Payer: Medicare Other | Admitting: Orthopaedic Surgery

## 2018-09-16 VITALS — Ht 62.5 in | Wt 245.0 lb

## 2018-09-16 DIAGNOSIS — G2581 Restless legs syndrome: Secondary | ICD-10-CM

## 2018-09-16 DIAGNOSIS — Z6841 Body Mass Index (BMI) 40.0 and over, adult: Secondary | ICD-10-CM

## 2018-09-16 DIAGNOSIS — R413 Other amnesia: Secondary | ICD-10-CM | POA: Diagnosis not present

## 2018-09-16 DIAGNOSIS — M5441 Lumbago with sciatica, right side: Secondary | ICD-10-CM

## 2018-09-16 DIAGNOSIS — I2510 Atherosclerotic heart disease of native coronary artery without angina pectoris: Secondary | ICD-10-CM

## 2018-09-16 DIAGNOSIS — M5442 Lumbago with sciatica, left side: Secondary | ICD-10-CM | POA: Diagnosis not present

## 2018-09-16 DIAGNOSIS — M1712 Unilateral primary osteoarthritis, left knee: Secondary | ICD-10-CM

## 2018-09-16 DIAGNOSIS — R269 Unspecified abnormalities of gait and mobility: Secondary | ICD-10-CM | POA: Diagnosis not present

## 2018-09-16 DIAGNOSIS — E1142 Type 2 diabetes mellitus with diabetic polyneuropathy: Secondary | ICD-10-CM

## 2018-09-16 DIAGNOSIS — G8929 Other chronic pain: Secondary | ICD-10-CM | POA: Diagnosis not present

## 2018-09-16 DIAGNOSIS — E1140 Type 2 diabetes mellitus with diabetic neuropathy, unspecified: Secondary | ICD-10-CM

## 2018-09-16 HISTORY — DX: Type 2 diabetes mellitus with diabetic neuropathy, unspecified: E11.40

## 2018-09-16 NOTE — Progress Notes (Signed)
Virtual Visit via Telephone Note  I connected with [name] on 09/16/18 at 10:00 AM EDT by telephone and verified that I am speaking with the correct person using two identifiers.  Location: Patient: The patient is at home. Provider: Physician in office.   I discussed the limitations, risks, security and privacy concerns of performing an evaluation and management service by telephone and the availability of in person appointments. I also discussed with the patient that there may be a patient responsible charge related to this service. The patient expressed understanding and agreed to proceed.   History of Present Illness: Norma [name] is a 68 year old right-handed white female with a history of diabetes associated with a diabetic neuropathy.  The patient has obesity, she has degenerative arthritis in the knees and she is considering a total knee replacement on the left and eventually on the right in the future.  The patient unfortunately has had increased falls recently, she has fallen at least 4 times in the last several months, the last fall was 2 weeks ago.  She has begun using a cane inside the house.  She reports episodes of what sound like dystonic posturing of the feet at times with turning in the feet when she is sitting or lying down, not while walking.  Occasionally she may have a myoclonic jerk.  She is on gabapentin taking 300 mg twice daily.  She has ongoing chronic low back pain.  She does have a history of a memory problem, but she believes over time this has actually improved.  She does drive a car on occasion, she has no problems with directions with driving.  She gets her medications and packs which helps her keep up with her pills, she is able to manage her own appointments and finances.  She claims that she is sleeping well at night.  She has a history of small vessel disease, she did have a small left parietal infarct noted on a MRI of the brain in January 2019.  The  patient is being evaluated for the above issues.   Observations/Objective: The telephone evaluation reveals that the patient is alert and cooperative.  Speech is well enunciated, not aphasic or dysarthric.  The [facility]-blind evaluation reveals a score of 18/22.  Assessment and Plan: 1.  Mild memory disturbance  2.  Diabetes with diabetic peripheral neuropathy  3.  Degenerative arthritis, bilateral knees  4.  Gait disturbance, multiple falls  5.  Cerebrovascular disease  6.  Restless leg syndrome  The patient is on Requip for her restless legs.  She does have a peripheral neuropathy, this may be in part the source of some for gait instability.  The patient believes that her walking is worsening slightly over time.  She will be set up for physical therapy prior to her knee surgery, if she falls following a total knee replacement this may result in significant damage to the surgical site.  I agree that the patient should be using a cane, she may need to use a walker following surgery.  She will continue the low-dose baclofen for now for the nocturnal leg cramps.  The patient went off of her cholesterol medication secondary to neuromuscular discomfort.  Follow Up Instructions: 11-month follow-up, may see nurse practitioner.   I discussed the assessment and treatment plan with the patient. The patient was provided an opportunity to ask questions and all were answered. The patient agreed with the plan and demonstrated an understanding of the instructions.  The patient was advised to call back or seek an in-person evaluation if the symptoms worsen or if the condition fails to improve as anticipated.  I provided 25 minutes of non-face-to-face time during this encounter.   Kathrynn Ducking, MD

## 2018-09-16 NOTE — Progress Notes (Signed)
Office Visit Note   Patient: Norma [name]           Date of Birth: 1950-10-18           MRN: [id number] Visit Date: 09/16/2018              Requested by: Sinda Du, MD [address] [place], [place] [address] PCP: Sinda Du, MD   Assessment & Plan: Visit Diagnoses:  1. Unilateral primary osteoarthritis, left knee   2. Chronic bilateral low back pain with bilateral sciatica     Plan: Chronic osteoarthritis left knee with end-stage changes.  Has long history of diabetes with associated neuropathy and restless leg syndrome.  Has been evaluated for total knee replacement in the past but based on her weight she is not a candidate.  Her BMI is still 45.  Discussion today regarding her knee and what she can expect over time but she does need to lose weight.  We again have discussed the bariatric clinic and she wanted to "think about it".  Also was having considerable pain in both of her lower extremities but more so on the left than the right.  She has been evaluated by Dr. Bobby Rumpf at the neurology clinic who thought she might have neuropathy.  Also has 3 of back problems.  Could have spinal stenosis.  Will order repeat MRI of her lumbar spine to compare from the study performed in 2016.  That scan demonstrated extraforaminal disc protrusion on the left at L4-5 that could irritate the left L4 nerve root. Mrs Wardrop has multiple comorbidities many related to her diabetes.  I think she might be at significant risk with knee surgery. Office visit over 30 minutes 50% of the time in counseling  Follow-Up Instructions: No follow-ups on file.   Orders:  No orders of the defined types were placed in this encounter.  No orders of the defined types were placed in this encounter.     Procedures: No procedures performed   Clinical Data: No additional findings.   Subjective: Chief Complaint  Patient presents with  . Left Knee - Pain  Patient presents today for left knee and leg  pain. She fell three weeks ago and hit her leg. She is having pain all throughout her left knee and leg. She has been experiencing weakness and grinding, but states that it not new. She wants to talk about possible surgery again.  She takes Tramadol as needed. I saw Mrs. Talford in October 2019.  Dr. Lorin Mercy saw her in November 2018.  He reinforced the fact that her weight would preclude her having surgery.  Her BMI is still 45.  She has a chronic problem with her back and bilateral lower extremity and is being followed by the neurology service with probable diabetic neuropathy.  Has had an exacerbation of her left knee pain.  Also has history of falling that could be related to her diabetes and has been using a cane  HPI  Review of Systems   Objective: Vital Signs: Ht 5' 2.5" (1.588 m)   Wt 245 lb (111.1 kg)   BMI 44.10 kg/m   Physical Exam Constitutional:      Appearance: She is well-developed.  Eyes:     Pupils: Pupils are equal, round, and reactive to light.  Pulmonary:     Effort: Pulmonary effort is normal.  Skin:    General: Skin is warm and dry.  Neurological:     Mental Status: She is alert  and oriented to person, place, and time.  Psychiatric:        Behavior: Behavior normal.     Ortho Exam knee had small effusion but was not hot red warm or swollen.  No instability.  Lacked about 20 degrees of full knee extension with predominant medial joint pain.  Some patellar crepitation.  No lateral joint pain.  No popliteal pain or mass.  No calf pain.  Painless range of motion both hips.  Does have altered sensibility in both of her feet related to her diabetic neuropathy Multiple dog scratches to both forearms and both legs.  No active drainage or evidence of active infection  Specialty Comments:  No specialty comments available.  Imaging: No results found.   PMFS History: Patient Active Problem List   Diagnosis Date Noted  . Diabetic neuropathy ([place]) 09/16/2018  .  Unilateral primary osteoarthritis, left knee 09/16/2018  . Class 3 severe obesity due to excess calories with serious comorbidity and body mass index (BMI) of 45.0 to 49.9 in adult ([place]) 02/20/2018  . Mixed hyperlipidemia 09/11/2017  . Pressure injury of skin 09/02/2017  . Coronary artery disease involving native heart without angina pectoris   . S/P CABG x 3   . Leukocytosis   . Acute blood loss anemia   . Post-operative pain   . Bipolar affective disorder ([place])   . Diabetes mellitus type 2 in obese ([place])   . History of CVA (cerebrovascular accident)   . Hypokalemia   . Hx of CABG 08/29/2017  . Coronary artery disease involving native coronary artery of native heart with unstable angina pectoris ([place]) 08/28/2017  . Unstable angina ([place]) 08/27/2017  . Cardiomyopathy ([place]) 08/22/2017  . Stroke (cerebrum) ([place]) 05/16/2017  . Low hemoglobin 09/19/2016  . Dizziness 06/21/2016  . Chest pain with moderate risk for cardiac etiology 04/23/2016  . Bipolar I disorder, most recent episode depressed ([place]) 06/05/2015  . DM type 2 causing vascular disease ([place]) 09/20/2014  . Chronic bilateral low back pain with bilateral sciatica 09/20/2014  . Morbid obesity, unspecified obesity type ([place]) 09/20/2014  . Memory difficulty 09/20/2014  . Lumbosacral spondylosis without myelopathy 12/25/2012  . Gastroparesis 11/04/2012  . Nausea alone 11/04/2012  . Restless legs syndrome (RLS) 09/17/2012  . Headache(784.0) 03/19/2012  . GERD (gastroesophageal reflux disease) 02/16/2012  . Abdominal pain 02/16/2012  . Hypothyroidism 02/16/2012  . Tremor 02/16/2012  . Hyperlipidemia associated with type 2 diabetes mellitus ([place]) 04/01/2008  . Obstructive sleep apnea 04/01/2008  . Essential hypertension, benign 04/01/2008  . LBBB (left bundle branch block) 04/01/2008   Past Medical History:  Diagnosis Date  . Anemia   . Anxiety   . Bipolar disorder ([place])   . Bulging lumbar disc    L3-4  . Chronic daily  headache   . Chronic low back pain 09/20/2014  . COPD (chronic obstructive pulmonary disease) ([place])   . Degenerative arthritis   . Depression   . Diabetes mellitus, type II ([place])   . Diabetic neuropathy ([place]) 09/16/2018  . DM type 2 with diabetic peripheral neuropathy ([place]) 09/20/2014  . Dyslipidemia   . Dyspnea   . Gastroparesis   . GERD (gastroesophageal reflux disease)   . Heart murmur   . History of hiatal hernia   . Hypothyroidism   . IBS (irritable bowel syndrome)   . Memory difficulty 09/20/2014  . Morbid obesity ([place])   . Neuropathy   . Obstructive sleep apnea on CPAP   . Restless legs syndrome (RLS)  09/17/2012  . Stroke (cerebrum) ([place]) 05/16/2017   Left parietal    Family History  Problem Relation Age of Onset  . Hypertension Mother   . Lymphoma Mother   . Depression Mother   . Arthritis Father   . Alcohol abuse Father   . Hypertension Sister   . Cancer Brother        kidney and lung  . Alcohol abuse Brother   . Alcohol abuse Paternal Uncle   . Alcohol abuse Paternal Grandfather   . Alcohol abuse Paternal Grandmother     Past Surgical History:  Procedure Laterality Date  . ABDOMINAL HYSTERECTOMY    . ARTHROSCOPY KNEE W/ DRILLING  06/2011   and Decemer of 2013.  [name] Carpel tunnel  1980's  . CATARACT EXTRACTION W/PHACO Right 03/21/2017   Procedure: CATARACT EXTRACTION PHACO AND INTRAOCULAR LENS PLACEMENT RIGHT EYE;  Surgeon: Baruch Goldmann, MD;  Location: [facility];  Service: Ophthalmology;  Laterality: Right;  CDE: 2.91   . CATARACT EXTRACTION W/PHACO Left 04/18/2017   Procedure: CATARACT EXTRACTION PHACO AND INTRAOCULAR LENS PLACEMENT LEFT EYE;  Surgeon: Baruch Goldmann, MD;  Location: [facility];  Service: Ophthalmology;  Laterality: Left;  left  . CHOLECYSTECTOMY    . COLONOSCOPY WITH PROPOFOL N/A 10/04/2016   Procedure: COLONOSCOPY WITH PROPOFOL;  Surgeon: Rogene Houston, MD;  Location: [facility];  Service: Endoscopy;  Laterality: N/A;  11:10  . CORONARY ARTERY BYPASS  GRAFT N/A 08/29/2017   Procedure: CORONARY ARTERY BYPASS GRAFTING (CABG) x 3 WITH ENDOSCOPIC HARVESTING OF RIGHT SAPHENOUS VEIN;  Surgeon: Ivin Poot, MD;  Location: [facility];  Service: Open Heart Surgery;  Laterality: N/A;  . ESOPHAGOGASTRODUODENOSCOPY N/A 04/25/2016   Procedure: ESOPHAGOGASTRODUODENOSCOPY (EGD);  Surgeon: Rogene Houston, MD;  [facility];  Service: Endoscopy;  Laterality: N/A;  730  . LEFT HEART CATH AND CORONARY ANGIOGRAPHY N/A 08/27/2017   Procedure: LEFT HEART CATH AND CORONARY ANGIOGRAPHY;  Surgeon: Sherren Mocha, MD;  Location: [facility];  Service: Cardiovascular::  pLAD 95% - p-mLAD 50%, ostD1 90% -pD1 80%; ostOM1 90%; rPDA 80%. EF ~50-55% - HK of dital Anterolateral & Apical wall.  - Rec CVTS c/s  . RECTAL SURGERY     fissure  . SHOULDER SURGERY Left    arthroscopy in March of this year  . TEE WITHOUT CARDIOVERSION N/A 08/29/2017   Procedure: TRANSESOPHAGEAL ECHOCARDIOGRAM (TEE);  Surgeon: Prescott Gum, Collier Salina, MD;  Location: [facility];  Service: Open Heart Surgery;  Laterality: N/A;  . TRANSTHORACIC ECHOCARDIOGRAM  06/06/2017   Mild to moderate reduced EF 40 and 45%.  Anterior septal, inferoseptal and basal to mid inferior hypokinesis.  GR 1 DD.  No significant valvular lesion   Social History   Occupational History    Employer: [employer]  Tobacco Use  . Smoking status: Former Smoker    Types: Cigarettes    Last attempt to quit: 02/04/1971    Years since quitting: 47.6  . Smokeless tobacco: Never Used  . Tobacco comment: smoked 2 cigarettes a day  Substance and Sexual Activity  . Alcohol use: No    Alcohol/week: 0.0 standard drinks  . Drug use: No  . Sexual activity: Never

## 2018-09-16 NOTE — Addendum Note (Signed)
Addended by: Lendon Collar on: 09/16/2018 02:19 PM   Modules accepted: Orders

## 2018-09-29 ENCOUNTER — Other Ambulatory Visit: Payer: Self-pay

## 2018-09-29 ENCOUNTER — Ambulatory Visit (HOSPITAL_COMMUNITY)
Admission: RE | Admit: 2018-09-29 | Discharge: 2018-09-29 | Disposition: A | Discharge status: Home / Self Care | Payer: Medicare Other | Source: Ambulatory Visit | Attending: Orthopaedic Surgery | Admitting: Orthopaedic Surgery

## 2018-09-29 DIAGNOSIS — M47817 Spondylosis without myelopathy or radiculopathy, lumbosacral region: Secondary | ICD-10-CM | POA: Diagnosis not present

## 2018-09-29 DIAGNOSIS — M5442 Lumbago with sciatica, left side: Secondary | ICD-10-CM | POA: Diagnosis not present

## 2018-09-29 DIAGNOSIS — G8929 Other chronic pain: Secondary | ICD-10-CM | POA: Insufficient documentation

## 2018-09-29 DIAGNOSIS — M5137 Other intervertebral disc degeneration, lumbosacral region: Secondary | ICD-10-CM | POA: Diagnosis not present

## 2018-09-29 DIAGNOSIS — M5441 Lumbago with sciatica, right side: Secondary | ICD-10-CM | POA: Insufficient documentation

## 2018-10-06 ENCOUNTER — Ambulatory Visit (HOSPITAL_COMMUNITY): Payer: Medicare Other | Attending: Neurology

## 2018-10-06 ENCOUNTER — Encounter (HOSPITAL_COMMUNITY): Payer: Self-pay

## 2018-10-07 ENCOUNTER — Ambulatory Visit: Payer: 59 | Admitting: "Endocrinology

## 2018-10-19 ENCOUNTER — Telehealth: Payer: Self-pay | Admitting: Cardiology

## 2018-10-19 ENCOUNTER — Ambulatory Visit: Payer: PPO | Admitting: Neurology

## 2018-10-19 NOTE — Telephone Encounter (Signed)
Pt says she stopped pravastatin for the last 2 weeks and symptoms of leg aches has improved

## 2018-10-19 NOTE — Telephone Encounter (Signed)
Awaiting call back from patient to advise to break pravastatin in half and update [place] with symptoms in 2-3 weeks.

## 2018-10-19 NOTE — Telephone Encounter (Signed)
Patient called stating that she cannot take pravastatin (PRAVACHOL) 40 MG . States that it is making her legs ache .   [phone]

## 2018-10-20 ENCOUNTER — Encounter (INDEPENDENT_AMBULATORY_CARE_PROVIDER_SITE_OTHER): Payer: Self-pay | Admitting: Internal Medicine

## 2018-10-20 ENCOUNTER — Ambulatory Visit (INDEPENDENT_AMBULATORY_CARE_PROVIDER_SITE_OTHER): Payer: Medicare Other | Admitting: Internal Medicine

## 2018-10-20 ENCOUNTER — Other Ambulatory Visit: Payer: Self-pay

## 2018-10-20 ENCOUNTER — Encounter (INDEPENDENT_AMBULATORY_CARE_PROVIDER_SITE_OTHER): Payer: Self-pay | Admitting: *Deleted

## 2018-10-20 VITALS — BP 94/54 | HR 82 | Temp 98.0°F | Resp 18 | Ht 64.0 in | Wt 239.2 lb

## 2018-10-20 DIAGNOSIS — R112 Nausea with vomiting, unspecified: Secondary | ICD-10-CM | POA: Diagnosis not present

## 2018-10-20 DIAGNOSIS — K3184 Gastroparesis: Secondary | ICD-10-CM

## 2018-10-20 DIAGNOSIS — K219 Gastro-esophageal reflux disease without esophagitis: Secondary | ICD-10-CM | POA: Diagnosis not present

## 2018-10-20 DIAGNOSIS — I2510 Atherosclerotic heart disease of native coronary artery without angina pectoris: Secondary | ICD-10-CM | POA: Diagnosis not present

## 2018-10-20 MED ORDER — ONDANSETRON HCL 4 MG PO TABS: 4.0000 mg | ORAL_TABLET | Freq: Two times a day (BID) | ORAL | 1 refills | Status: DC | PRN

## 2018-10-20 NOTE — Telephone Encounter (Signed)
She had recently increased the pravastatin dose to 40mg  daily, I believe she did ok on the 20mg  dose, is she willing to go back to the pravastatin 20mg  daily  J BrancH MD

## 2018-10-20 NOTE — Telephone Encounter (Signed)
Pt says she tried to 20 mg before stopping and was not willing to go back on pravastatin at all

## 2018-10-20 NOTE — Progress Notes (Signed)
See other note

## 2018-10-20 NOTE — Patient Instructions (Signed)
Stop ferrous sulfate for few days and see if it helps ameliorate nausea.  If not you can go back on it. Take ondansetron 4 mg every morning for a week or so and thereafter on as-needed basis. Solid-phase gastric emptying study to be scheduled.

## 2018-10-21 NOTE — Telephone Encounter (Signed)
Ok to stop for now, we can discuss some other possible options at our f/u in the next few months   Zandra Abts MD

## 2018-10-21 NOTE — Progress Notes (Signed)
Presenting complaint;  Nausea and sporadic vomiting.  Database and subjective:  Patient is 68 year old Caucasian female with multiple medical problems who also has chronic GERD and history of gastroparesis with last emptying study back in January 2020 who was last seen in September 2019 and was doing well. She also has history of iron deficiency anemia.  Patient now presents with nausea which she experiences every day.  She wakes up nauseated and it gets worse every time she eats.  She generally eats small meals.  Nausea can last all day.  She says she does not vomit often.  She may have vomited 4 or 5 times in the last 9 months.  She says heartburn is well controlled.  She is not having any chest pain or dysphagia.  Her bowels move every second or third day.  Her appetite is not good.  She has lost 25 pounds since she was last seen.  Her breakfast consist of small portion of cereal or chocolate milk.  She rarely eats lunch.  She has a sandwich at supper. She says she uses Tums no more than once a month.  She is having difficulty walking because of arthritis and peripheral neuropathy.  She says she has fallen 4 times since November 2019.  She is under care of Dr. Floyde Parkins.  Physical therapy is on hold because of COVID-19 pandemic.  Last EGD was in January 2018 revealing small sliding hiatal hernia and mild gastritis.  Biopsy was negative for H. pylori infection.  Current Medications: Outpatient Encounter Medications as of 10/20/2018  Medication Sig  . albuterol (PROVENTIL HFA;VENTOLIN HFA) 108 (90 Base) MCG/ACT inhaler Inhale 2 puffs into the lungs every 6 (six) hours as needed for wheezing or shortness of breath.  [name] aspirin EC 81 MG tablet Take 1 tablet (81 mg total) by mouth daily.  . baclofen (LIORESAL) 10 MG tablet TAKE ONE TABLET BY MOUTH AT BEDTIME.  [name] buPROPion (WELLBUTRIN XL) 300 MG 24 hr tablet TAKE ONE TABLET BY MOUTH EVERY MORNING.  . calcium carbonate (TUMS - DOSED IN MG ELEMENTAL  CALCIUM) 500 MG chewable tablet Chew 2 tablets by mouth as needed for indigestion or heartburn.   . carvedilol (COREG) 6.25 MG tablet Take 1 tablet (6.25 mg total) by mouth 2 (two) times daily.  [name] EPINEPHrine (EPIPEN 2-PAK) 0.3 mg/0.3 mL IJ SOAJ injection Inject 0.3 mg into the muscle once.  [name] esomeprazole (NEXIUM) 40 MG capsule Take 40 mg by mouth at bedtime.   . ferrous sulfate 325 (65 FE) MG tablet Take 325 mg by mouth daily with breakfast.  . gabapentin (NEURONTIN) 300 MG capsule Take 1 capsule (300 mg total) by mouth 2 (two) times daily.  . hydrOXYzine (ATARAX/VISTARIL) 10 MG tablet Take 1 tablet (10 mg total) by mouth 3 (three) times daily as needed for itching.  . Insulin Glargine (TOUJEO SOLOSTAR) 300 UNIT/ML SOPN Inject 50 Units into the skin at bedtime.  . Lactase (LACTAID FAST ACT) 9000 units TABS Take by mouth. Patient states that she takes as needed.  . lamoTRIgine (LAMICTAL) 150 MG tablet TAKE ONE TABLET BY MOUTH AT BEDTIME.  [name] levothyroxine (SYNTHROID, LEVOTHROID) 100 MCG tablet Take 100 mcg by mouth daily before breakfast.   . losartan (COZAAR) 25 MG tablet Take 1 tablet (25 mg total) by mouth daily.  . meclizine (ANTIVERT) 25 MG tablet Take 1 tablet (25 mg total) by mouth 3 (three) times daily as needed for dizziness. (Patient taking differently: Take 25 mg by mouth daily  as needed for dizziness. )  . metFORMIN (GLUCOPHAGE) 1000 MG tablet Take 1,000 mg by mouth 2 (two) times daily with a meal.  . potassium chloride SA (K-DUR,KLOR-CON) 20 MEQ tablet Take 1 tablet (20 mEq total) by mouth daily.  [name] rOPINIRole (REQUIP) 2 MG tablet Take 2 mg by mouth 3 (three) times daily. .  . torsemide (DEMADEX) 20 MG tablet Take one tablet (20mg ) alternating days with (40mg ) daily (Patient taking differently: 20 mg daily. Take one tablet (20mg ) daily)  . traMADol (ULTRAM) 50 MG tablet Take 100 mg by mouth 4 (four) times daily as needed.   . traZODone (DESYREL) 50 MG tablet Take 50 mg by mouth at  bedtime.  [name] venlafaxine XR (EFFEXOR-XR) 150 MG 24 hr capsule TAKE TWO (2) CAPSULES BY MOUTH AT BEDTIME. (Patient taking differently: TAKE 300 MG BY MOUTH AT BEDTIME)  . ondansetron (ZOFRAN) 4 MG tablet Take 1 tablet (4 mg total) by mouth 2 (two) times daily as needed for nausea or vomiting.  . pravastatin (PRAVACHOL) 40 MG tablet Take 1 tablet (40 mg total) by mouth every evening. (Patient not taking: Reported on 10/20/2018)  . [DISCONTINUED] Multiple Vitamin (MULTIVITAMIN) capsule Take 1 capsule by mouth daily.  . [DISCONTINUED] Polyvinyl Alcohol-Povidone (REFRESH OP) Place 2 drops into both eyes daily as needed (for dry eyes).   No facility-administered encounter medications on file as of 10/20/2018.      Objective: Blood pressure (!) 94/54, pulse 82, temperature 98 F (36.7 C), temperature source Oral, resp. rate 18, height 5\' 4"  (1.626 m), weight 239 lb 3.2 oz (108.5 kg). Patient is alert and in no acute distress. Conjunctiva is pink. Sclera is nonicteric Oropharyngeal mucosa is normal. No neck masses or thyromegaly noted. Cardiac exam with regular rhythm normal S1 and S2. No murmur or gallop noted. Lungs are clear to auscultation. Abdomen is full.  She has clotting and lower abdomen with 2 superficial ulcers which are chronic.  There is no odor or drainage.  Bowel sounds are normal.  On palpation abdomen is soft and nontender with organomegaly or masses. No LE edema or clubbing noted. She has lipoma next to left lateral malleolus about the size of a small tangerine.  Labs/studies Results:  Hemoglobin on 04/24/2018 was 11g.  Assessment:  #1.  Nausea with sporadic vomiting.  Patient has history of gastroparesis dating back to 2005 but she has not required any specific therapy or promotility agent.  It is possible that her gastric emptying has gotten worse with longstanding diabetes and her medications may not be helping.  She had EGD in January 2018 and I do not feel it needs to be  repeated at this time given her symptoms but she would benefit from repeat gastric emptying study to determine severity of gastroparesis which would help with management.  #2.  GERD.  Typical symptoms are well controlled with therapy.  Plan:  Patient advised to hold off ferrous sulfate for 2 weeks or so and see if it makes a difference. Ondansetron 4 mg p.o. twice daily as needed.  She should try to take first dose as soon as she wakes up in maybe we can prevent or minimize her symptoms. Solid-phase gastric emptying study to be scheduled. Further recommendations to follow.

## 2018-10-22 DIAGNOSIS — J449 Chronic obstructive pulmonary disease, unspecified: Secondary | ICD-10-CM | POA: Diagnosis not present

## 2018-10-22 DIAGNOSIS — M1990 Unspecified osteoarthritis, unspecified site: Secondary | ICD-10-CM | POA: Diagnosis not present

## 2018-10-22 DIAGNOSIS — I2510 Atherosclerotic heart disease of native coronary artery without angina pectoris: Secondary | ICD-10-CM | POA: Diagnosis not present

## 2018-10-22 DIAGNOSIS — E119 Type 2 diabetes mellitus without complications: Secondary | ICD-10-CM | POA: Diagnosis not present

## 2018-10-29 ENCOUNTER — Other Ambulatory Visit: Payer: Self-pay | Admitting: Physician Assistant

## 2018-10-30 ENCOUNTER — Ambulatory Visit (HOSPITAL_COMMUNITY): Payer: Medicare Other | Attending: Neurology | Admitting: Physical Therapy

## 2018-10-30 ENCOUNTER — Other Ambulatory Visit (HOSPITAL_COMMUNITY)
Admission: RE | Admit: 2018-10-30 | Discharge: 2018-10-30 | Disposition: A | Discharge status: Home / Self Care | Payer: Medicare Other | Source: Ambulatory Visit | Attending: "Endocrinology | Admitting: "Endocrinology

## 2018-10-30 ENCOUNTER — Other Ambulatory Visit: Payer: Self-pay

## 2018-10-30 ENCOUNTER — Ambulatory Visit (HOSPITAL_COMMUNITY): Payer: Medicare Other | Admitting: Physical Therapy

## 2018-10-30 ENCOUNTER — Other Ambulatory Visit (HOSPITAL_COMMUNITY)
Admission: RE | Admit: 2018-10-30 | Discharge: 2018-10-30 | Disposition: A | Discharge status: Home / Self Care | Payer: Medicare Other | Source: Ambulatory Visit | Attending: Cardiology | Admitting: Cardiology

## 2018-10-30 ENCOUNTER — Ambulatory Visit (HOSPITAL_COMMUNITY)
Admission: RE | Admit: 2018-10-30 | Discharge: 2018-10-30 | Disposition: A | Discharge status: Home / Self Care | Payer: Medicare Other | Source: Ambulatory Visit | Attending: Internal Medicine | Admitting: Internal Medicine

## 2018-10-30 ENCOUNTER — Encounter (HOSPITAL_COMMUNITY): Payer: Self-pay | Admitting: Physical Therapy

## 2018-10-30 DIAGNOSIS — I10 Essential (primary) hypertension: Secondary | ICD-10-CM | POA: Insufficient documentation

## 2018-10-30 DIAGNOSIS — R2681 Unsteadiness on feet: Secondary | ICD-10-CM | POA: Diagnosis not present

## 2018-10-30 DIAGNOSIS — M6281 Muscle weakness (generalized): Secondary | ICD-10-CM | POA: Insufficient documentation

## 2018-10-30 DIAGNOSIS — R112 Nausea with vomiting, unspecified: Secondary | ICD-10-CM | POA: Insufficient documentation

## 2018-10-30 DIAGNOSIS — R2689 Other abnormalities of gait and mobility: Secondary | ICD-10-CM | POA: Insufficient documentation

## 2018-10-30 DIAGNOSIS — E1159 Type 2 diabetes mellitus with other circulatory complications: Secondary | ICD-10-CM | POA: Diagnosis not present

## 2018-10-30 DIAGNOSIS — E038 Other specified hypothyroidism: Secondary | ICD-10-CM | POA: Insufficient documentation

## 2018-10-30 LAB — CBC WITH DIFFERENTIAL/PLATELET
Abs Immature Granulocytes: 0.04 10*3/uL (ref 0.00–0.07)
Basophils Absolute: 0.1 10*3/uL (ref 0.0–0.1)
Basophils Relative: 1 %
Eosinophils Absolute: 0.6 10*3/uL — ABNORMAL HIGH (ref 0.0–0.5)
Eosinophils Relative: 5 %
HCT: 35.9 % — ABNORMAL LOW (ref 36.0–46.0)
Hemoglobin: 11.3 g/dL — ABNORMAL LOW (ref 12.0–15.0)
Immature Granulocytes: 0 %
Lymphocytes Relative: 17 %
Lymphs Abs: 2 10*3/uL (ref 0.7–4.0)
MCH: 29.4 pg (ref 26.0–34.0)
MCHC: 31.5 g/dL (ref 30.0–36.0)
MCV: 93.5 fL (ref 80.0–100.0)
Monocytes Absolute: 0.8 10*3/uL (ref 0.1–1.0)
Monocytes Relative: 7 %
Neutro Abs: 8.2 10*3/uL — ABNORMAL HIGH (ref 1.7–7.7)
Neutrophils Relative %: 70 %
Platelets: 387 10*3/uL (ref 150–400)
RBC: 3.84 MIL/uL — ABNORMAL LOW (ref 3.87–5.11)
RDW: 13.7 % (ref 11.5–15.5)
WBC: 11.7 10*3/uL — ABNORMAL HIGH (ref 4.0–10.5)
nRBC: 0 % (ref 0.0–0.2)

## 2018-10-30 LAB — COMPREHENSIVE METABOLIC PANEL
ALT: 12 U/L (ref 0–44)
AST: 15 U/L (ref 15–41)
Albumin: 3.2 g/dL — ABNORMAL LOW (ref 3.5–5.0)
Alkaline Phosphatase: 42 U/L (ref 38–126)
Anion gap: 11 (ref 5–15)
BUN: 15 mg/dL (ref 8–23)
CO2: 28 mmol/L (ref 22–32)
Calcium: 8.9 mg/dL (ref 8.9–10.3)
Chloride: 98 mmol/L (ref 98–111)
Creatinine, Ser: 1.27 mg/dL — ABNORMAL HIGH (ref 0.44–1.00)
GFR calc Af Amer: 51 mL/min — ABNORMAL LOW (ref >60)
GFR calc non Af Amer: 44 mL/min — ABNORMAL LOW (ref >60)
Glucose, Bld: 96 mg/dL (ref 70–99)
Potassium: 3.4 mmol/L — ABNORMAL LOW (ref 3.5–5.1)
Sodium: 137 mmol/L (ref 135–145)
Total Bilirubin: 0.4 mg/dL (ref 0.3–1.2)
Total Protein: 6.4 g/dL — ABNORMAL LOW (ref 6.5–8.1)

## 2018-10-30 LAB — TSH: TSH: 2.671 u[IU]/mL (ref 0.350–4.500)

## 2018-10-30 LAB — HEMOGLOBIN A1C
Hgb A1c MFr Bld: 6.4 % — ABNORMAL HIGH (ref 4.8–5.6)
Mean Plasma Glucose: 136.98 mg/dL

## 2018-10-30 LAB — MAGNESIUM: Magnesium: 1.8 mg/dL (ref 1.7–2.4)

## 2018-10-30 LAB — T4, FREE: Free T4: 1.05 ng/dL (ref 0.61–1.12)

## 2018-10-30 MED ORDER — TECHNETIUM TC 99M SULFUR COLLOID
2.0000 | Freq: Once | INTRAVENOUS | Status: AC | PRN
  Administered 2018-10-30: 2.2 via ORAL

## 2018-10-30 NOTE — Therapy (Signed)
[place], [place], [address] Phone: [phone]   Fax:  [phone]  Physical Therapy Evaluation  Patient Details  Name: Norma [name] MRN: [id number] Date of Birth: [birth year] Referring Provider (PT): Kathrynn Ducking, MD   Encounter Date: 10/30/2018  PT End of Session - 10/30/18 1500    Visit Number  1    Number of Visits  8    Date for PT Re-Evaluation  11/27/18    Authorization Type  Primary: Medicare; Secondary: [employer] supplement    Authorization Time Period  10/30/18 - 11/27/18    Authorization - Visit Number  1    Authorization - Number of Visits  10    PT Start Time  1500    PT Stop Time  1545    PT Time Calculation (min)  45 min    Equipment Utilized During Treatment  Gait belt    Activity Tolerance  Patient tolerated treatment well;Patient limited by fatigue    Behavior During Therapy  [facility] for tasks assessed/performed       Past Medical History:  Diagnosis Date  . Anemia   . Anxiety   . Bipolar disorder ([place])   . Bulging lumbar disc    L3-4  . Chronic daily headache   . Chronic low back pain 09/20/2014  . COPD (chronic obstructive pulmonary disease) ([place])   . Degenerative arthritis   . Depression   . Diabetes mellitus, type II ([place])   . Diabetic neuropathy ([place]) 09/16/2018  . DM type 2 with diabetic peripheral neuropathy ([place]) 09/20/2014  . Dyslipidemia   . Dyspnea   . Gastroparesis   . GERD (gastroesophageal reflux disease)   . Heart murmur   . History of hiatal hernia   . Hypothyroidism   . IBS (irritable bowel syndrome)   . Memory difficulty 09/20/2014  . Morbid obesity ([place])   . Neuropathy   . Obstructive sleep apnea on CPAP   . Restless legs syndrome (RLS) 09/17/2012  . Stroke (cerebrum) ([place]) 05/16/2017   Left parietal    Past Surgical History:  Procedure Laterality Date  . ABDOMINAL HYSTERECTOMY    . ARTHROSCOPY KNEE W/ DRILLING  06/2011   and Decemer of 2013.  [name] Carpel  tunnel  1980's  . CATARACT EXTRACTION W/PHACO Right 03/21/2017   Procedure: CATARACT EXTRACTION PHACO AND INTRAOCULAR LENS PLACEMENT RIGHT EYE;  Surgeon: Baruch Goldmann, MD;  Location: [facility];  Service: Ophthalmology;  Laterality: Right;  CDE: 2.91   . CATARACT EXTRACTION W/PHACO Left 04/18/2017   Procedure: CATARACT EXTRACTION PHACO AND INTRAOCULAR LENS PLACEMENT LEFT EYE;  Surgeon: Baruch Goldmann, MD;  Location: [facility];  Service: Ophthalmology;  Laterality: Left;  left  . CHOLECYSTECTOMY    . COLONOSCOPY WITH PROPOFOL N/A 10/04/2016   Procedure: COLONOSCOPY WITH PROPOFOL;  Surgeon: Rogene Houston, MD;  Location[facility];  Service: Endoscopy;  Laterality: N/A;  11:10  . CORONARY ARTERY BYPASS GRAFT N/A 08/29/2017   Procedure: CORONARY ARTERY BYPASS GRAFTING (CABG) x 3 WITH ENDOSCOPIC HARVESTING OF RIGHT SAPHENOUS VEIN;  Surgeon: Ivin Poot, MD;  Location: [facility];  Service: Open Heart Surgery;  Laterality: N/A;  . ESOPHAGOGASTRODUODENOSCOPY N/A 04/25/2016   Procedure: ESOPHAGOGASTRODUODENOSCOPY (EGD);  Surgeon: Rogene Houston, MD;  [facility];  Service: Endoscopy;  Laterality: N/A;  730  . LEFT HEART CATH AND CORONARY ANGIOGRAPHY N/A 08/27/2017   Procedure: LEFT HEART CATH AND CORONARY ANGIOGRAPHY;  Surgeon: Sherren Mocha,  MD;  Location: [place] [facility];  Service: Cardiovascular::  pLAD 95% - p-mLAD 50%, ostD1 90% -pD1 80%; ostOM1 90%; rPDA 80%. EF ~50-55% - HK of dital Anterolateral & Apical wall.  - Rec CVTS c/s  . RECTAL SURGERY     fissure  . SHOULDER SURGERY Left    arthroscopy in March of this year  . TEE WITHOUT CARDIOVERSION N/A 08/29/2017   Procedure: TRANSESOPHAGEAL ECHOCARDIOGRAM (TEE);  Surgeon: Prescott Gum, Collier Salina, MD;  Location: [facility];  Service: Open Heart Surgery;  Laterality: N/A;  . TRANSTHORACIC ECHOCARDIOGRAM  06/06/2017   Mild to moderate reduced EF 40 and 45%.  Anterior septal, inferoseptal and basal to mid inferior hypokinesis.  GR 1 DD.  No  significant valvular lesion    There were no vitals filed for this visit.   Subjective Assessment - 10/30/18 1506    Subjective  Patient reported that she has neuropathy in both feet  up to her knees from DM II. Patient reported that her walking has been gradually getting worse over the last several motnhs. She reported that she has a history of back pain. Patient reported that she has had 4 falls since October. Patient denied any changes in her bowel or bladder function. She reported that she used to be able to walk through the store, but that now she cannot. She thinks she hasn't been able to walk through the store since around Christmas. Patient reported that she has a cane at home, but that she doesn't use it a lot.    Pertinent History  COPD, CABG, History of TIAs, DMII, Bipolar, wears CPAP at night    Limitations  Walking;Standing;House hold activities    How long can you sit comfortably?  Hours    How long can you stand comfortably?  0 minutes    How long can you walk comfortably?  1 minute    Diagnostic tests  MRI Lumbar: DDD no neural impingement    Patient Stated Goals  To not fall    Currently in Pain?  Yes    Pain Score  9     Pain Location  Back    Pain Orientation  Lower    Pain Descriptors / Indicators  Sharp    Pain Type  Chronic pain   Several years   Pain Onset  More than a month ago    Pain Frequency  Other (Comment)   Any time she's up it's constant   Aggravating Factors   Standing    Pain Relieving Factors  Sitting    Effect of Pain on Daily Activities  Moderately         [facility] PT Assessment - 10/30/18 0001      Assessment   Medical Diagnosis  Diabetic polyneuropathy associated with type 2 diabetes Mellitus, gait disorder    Referring Provider (PT)  Kathrynn Ducking, MD    Onset Date/Surgical Date  --   The walking and falling have gotten worse since October   Next MD Visit  December    Prior Therapy  Yes, for shoulder      Balance Screen   Has the  patient fallen in the past 6 months  Yes    How many times?  3    Has the patient had a decrease in activity level because of a fear of falling?   No    Is the patient reluctant to leave their home because of a fear of falling?   No  Home Environment   Living Environment  Private residence    Living Arrangements  Alone;Other (Comment)   Nephew living with her currently   Type of [place]rier to enter    Entrance Stairs-Number of Steps  10    Entrance Stairs-Rails  Can reach both    [place] to live on main level with bedroom/bathroom    [place] - single point      Prior Function   Level of Independence  Independent;Independent with basic ADLs    Vocation  Retired    Leisure  Taking care of her 3 cats      Cognition   Overall Cognitive Status  Within Functional Limits for tasks assessed      Observation/Other Assessments   Observations  Noted large lipoma on patient's left ankle. Significant structural knee valgus on right. Decreased PROM knee extension in bilateral lower extremities.     Focus on Therapeutic Outcomes (FOTO)   60% limited      Sensation   Light Touch  Impaired by gross assessment    Additional Comments  Bilateral legs below knee decreased sensation      Posture/Postural Control   Posture/Postural Control  Postural limitations    Postural Limitations  Rounded Shoulders;Forward head      ROM / Strength   AROM / PROM / Strength  Strength      Strength   Strength Assessment Site  Hip;Knee;Ankle    Right/Left Hip  Right;Left    Right Hip Flexion  3-/5    Right Hip Extension  2+/5    Right Hip ABduction  3+/5    Left Hip Flexion  3-/5    Left Hip Extension  2+/5    Left Hip ABduction  3+/5    Right/Left Knee  Right;Left    Right Knee Flexion  4/5    Right Knee Extension  3+/5    Left Knee Flexion  4/5    Left Knee Extension  3+/5    Right/Left Ankle  Right;Left    Right Ankle Dorsiflexion  4/5    Left  Ankle Dorsiflexion  4/5      Flexibility   Soft Tissue Assessment /Muscle Length  yes    Hamstrings  50% limited bilaterally      Ambulation/Gait   Ambulation Distance (Feet)  58 Feet   2MWT with 1 seated rest break in wheelchair   Assistive device  None    Gait Pattern  Decreased stride length;Poor foot clearance - left;Poor foot clearance - right;Lateral trunk lean to right;Lateral trunk lean to left    Ambulation Surface  Level;Indoor    Gait Comments  Reaching or holding onto objects intermittently throughout for balance while ambulating                Objective measurements completed on examination: See above findings.              PT Education - 10/30/18 1555    Education Details  Discussed examination findings and plan of care and initial HEP.    Person(s) Educated  Patient    Methods  Explanation;Handout    Comprehension  Verbalized understanding       PT Short Term Goals - 10/30/18 1604      PT SHORT TERM GOAL #1   Title  Patient will report understanding and regular compliance with HEP to imrpove flexibility, strength, and overall functional mobility.  Time  2    Period  Weeks    Status  New    Target Date  11/13/18        PT Long Term Goals - 10/30/18 1606      PT LONG TERM GOAL #1   Title  Patient will ambulate an additionl 50 feet with LRAD on the 2MWT indicating improved endurance and overall functional mobility.    Time  4    Period  Weeks    Status  New    Target Date  11/27/18      PT LONG TERM GOAL #2   Title  Patient will demonstrate improvement of at least 1/2 MMT strength grade in all deficient muscle groups indicating improved strength to improve gait mehanics and safety.    Time  4    Period  Weeks    Status  New    Target Date  11/27/18      PT LONG TERM GOAL #3   Title  Patient will demonstrate imrovement of at least 10% on FOTO indicating imrpoved overall perceived functional mobility.    Time  4    Period  Weeks     Status  New    Target Date  11/27/18      PT LONG TERM GOAL #4   Title  Patient will be educated on safety tips to reduce falls and report understanding to improve patient's overall safety and mobility at home.    Time  4    Period  Weeks    Status  New    Target Date  11/27/18      PT LONG TERM GOAL #5   Title  Patient will report improvement in ability to walk for at least 10 minutes in order to begin going to stores to do shopping.    Time  4    Period  Weeks    Status  New    Target Date  11/27/18             Plan - 10/30/18 1610    Clinical Impression Statement  Patient is a 68 year old female who presented to [facility] for evaluation with primary complaints of decreased ability to ambulate without falls which has worsened over the last 6 months. Upon examination, noted decreased strength in bilateral lower extremities. Patient demonstrated decreased balance and endurance on the 2MWT as patient's gait velocity was decreased and patient demonstrated gait deviations. Patient's score on FOTO was 60% and indicated patient's perceived level of functional mobility. Measured patient's BP prior to walking and it was found to be 130/70. Noted with ambulation that patient was also holding onto objects as she walked indicating decreased dynamic balance. Patient would benefit from skilled physical therapy in order to address the abovementioned deficits and help patient reach PLOF.    Personal Factors and Comorbidities  Time since onset of injury/illness/exacerbation;Comorbidity 3+    Comorbidities  HTN, DMII, BP, Hx of TIAs, Hx of CABG surgery, CAD    Examination-Activity Limitations  Squat;Locomotion Level;Stand;Bend;Bed Mobility    Examination-Participation Restrictions  Shop;Cleaning;Community Activity    Stability/Clinical Decision Making  Evolving/Moderate complexity    Clinical Decision Making  Moderate    Rehab Potential  Fair    PT Frequency  2x / week    PT Duration  4 weeks     PT Treatment/Interventions  ADLs/Self Care Home Management;Aquatic Therapy;Cryotherapy;Electrical Stimulation;DME Instruction;Gait training;Stair training;Functional mobility training;Therapeutic activities;Therapeutic exercise;Balance training;Neuromuscular re-education;Patient/family education;Manual techniques;Orthotic Fit/Training;Passive range of motion;Dry needling;Energy  conservation;Taping    PT Next Visit Plan  Review evaluation and goals, review HEP. Consider performing formal balance assessment. Bilateral LE strengthening mat table.    PT Home Exercise Plan  10/30/18: Hamstring stretch seated 3x30'' 1x/day    Consulted and Agree with Plan of Care  Patient       Patient will benefit from skilled therapeutic intervention in order to improve the following deficits and impairments:  Abnormal gait, Improper body mechanics, Pain, Decreased mobility, Postural dysfunction, Decreased activity tolerance, Decreased endurance, Decreased range of motion, Decreased strength, Decreased balance, Difficulty walking, Impaired flexibility, Obesity  Visit Diagnosis: 1. Other abnormalities of gait and mobility   2. Muscle weakness (generalized)   3. Unsteadiness on feet        Problem List Patient Active Problem List   Diagnosis Date Noted  . Diabetic neuropathy ([place]) 09/16/2018  . Unilateral primary osteoarthritis, left knee 09/16/2018  . Class 3 severe obesity due to excess calories with serious comorbidity and body mass index (BMI) of 45.0 to 49.9 in adult ([place]) 02/20/2018  . Mixed hyperlipidemia 09/11/2017  . Pressure injury of skin 09/02/2017  . Coronary artery disease involving native heart without angina pectoris   . S/P CABG x 3   . Leukocytosis   . Acute blood loss anemia   . Post-operative pain   . Bipolar affective disorder ([place])   . Diabetes mellitus type 2 in obese ([place])   . History of CVA (cerebrovascular accident)   . Hypokalemia   . Hx of CABG 08/29/2017  . Coronary artery  disease involving native coronary artery of native heart with unstable angina pectoris ([place]) 08/28/2017  . Unstable angina ([place]) 08/27/2017  . Cardiomyopathy ([place]) 08/22/2017  . Stroke (cerebrum) ([place]) 05/16/2017  . Low hemoglobin 09/19/2016  . Dizziness 06/21/2016  . Chest pain with moderate risk for cardiac etiology 04/23/2016  . Bipolar I disorder, most recent episode depressed ([place]) 06/05/2015  . DM type 2 causing vascular disease ([place]) 09/20/2014  . Chronic bilateral low back pain with bilateral sciatica 09/20/2014  . Morbid obesity, unspecified obesity type ([place]) 09/20/2014  . Memory difficulty 09/20/2014  . Lumbosacral spondylosis without myelopathy 12/25/2012  . Gastroparesis 11/04/2012  . Nausea with vomiting 11/04/2012  . Restless legs syndrome (RLS) 09/17/2012  . Headache(784.0) 03/19/2012  . GERD (gastroesophageal reflux disease) 02/16/2012  . Abdominal pain 02/16/2012  . Hypothyroidism 02/16/2012  . Tremor 02/16/2012  . Hyperlipidemia associated with type 2 diabetes mellitus ([place]) 04/01/2008  . Obstructive sleep apnea 04/01/2008  . Essential hypertension, benign 04/01/2008  . LBBB (left bundle branch block) 04/01/2008   Clarene Critchley PT, DPT 4:20 PM, 10/30/18 [place], [place], [address] Phone: [phone]   Fax:  [phone]  Name: Norma [name] MRN: [id number] Date of Birth: 02/01/1951

## 2018-10-30 NOTE — Patient Instructions (Signed)
Hamstring Stretch    Sit on chair. Inhale and straighten spine. Lean forward to get a stretch behind your leg.  Exhale and lean forward toward extended leg. Hold position for _30 seconds__ . Inhale and come back to center. Repeat with other leg extended. Repeat _3__ times, alternating legs. Do _1-2__ times per day.  Copyright  VHI. All rights reserved.

## 2018-11-03 ENCOUNTER — Other Ambulatory Visit: Payer: Self-pay

## 2018-11-03 ENCOUNTER — Ambulatory Visit: Payer: Medicare Other | Admitting: "Endocrinology

## 2018-11-03 ENCOUNTER — Ambulatory Visit (INDEPENDENT_AMBULATORY_CARE_PROVIDER_SITE_OTHER): Payer: Medicare Other | Admitting: "Endocrinology

## 2018-11-03 ENCOUNTER — Encounter: Payer: Self-pay | Admitting: "Endocrinology

## 2018-11-03 ENCOUNTER — Other Ambulatory Visit: Payer: Self-pay | Admitting: Physician Assistant

## 2018-11-03 DIAGNOSIS — E038 Other specified hypothyroidism: Secondary | ICD-10-CM

## 2018-11-03 DIAGNOSIS — I2510 Atherosclerotic heart disease of native coronary artery without angina pectoris: Secondary | ICD-10-CM

## 2018-11-03 DIAGNOSIS — E1159 Type 2 diabetes mellitus with other circulatory complications: Secondary | ICD-10-CM

## 2018-11-03 DIAGNOSIS — E782 Mixed hyperlipidemia: Secondary | ICD-10-CM | POA: Diagnosis not present

## 2018-11-03 NOTE — Progress Notes (Signed)
Endocrinology follow-up note   Subjective:    Patient ID: Norma [name], female    DOB: 09/11/1950,    Past Medical History:  Diagnosis Date  . Anemia   . Anxiety   . Bipolar disorder ([place])   . Bulging lumbar disc    L3-4  . Chronic daily headache   . Chronic low back pain 09/20/2014  . COPD (chronic obstructive pulmonary disease) ([place])   . Degenerative arthritis   . Depression   . Diabetes mellitus, type II ([place])   . Diabetic neuropathy ([place]) 09/16/2018  . DM type 2 with diabetic peripheral neuropathy ([place]) 09/20/2014  . Dyslipidemia   . Dyspnea   . Gastroparesis   . GERD (gastroesophageal reflux disease)   . Heart murmur   . History of hiatal hernia   . Hypothyroidism   . IBS (irritable bowel syndrome)   . Memory difficulty 09/20/2014  . Morbid obesity ([place])   . Neuropathy   . Obstructive sleep apnea on CPAP   . Restless legs syndrome (RLS) 09/17/2012  . Stroke (cerebrum) ([place]) 05/16/2017   Left parietal   Past Surgical History:  Procedure Laterality Date  . ABDOMINAL HYSTERECTOMY    . ARTHROSCOPY KNEE W/ DRILLING  06/2011   and Decemer of 2013.  [name] Carpel tunnel  1980's  . CATARACT EXTRACTION W/PHACO Right 03/21/2017   Procedure: CATARACT EXTRACTION PHACO AND INTRAOCULAR LENS PLACEMENT RIGHT EYE;  Surgeon: Baruch Goldmann, MD;  Location: [facility];  Service: Ophthalmology;  Laterality: Right;  CDE: 2.91   . CATARACT EXTRACTION W/PHACO Left 04/18/2017   Procedure: CATARACT EXTRACTION PHACO AND INTRAOCULAR LENS PLACEMENT LEFT EYE;  Surgeon: Baruch Goldmann, MD;  Location: [facility];  Service: Ophthalmology;  Laterality: Left;  left  . CHOLECYSTECTOMY    . COLONOSCOPY WITH PROPOFOL N/A 10/04/2016   Procedure: COLONOSCOPY WITH PROPOFOL;  Surgeon: Rogene Houston, MD;  Location: [facility];  Service: Endoscopy;  Laterality: N/A;  11:10  . CORONARY ARTERY BYPASS GRAFT N/A 08/29/2017   Procedure: CORONARY ARTERY BYPASS GRAFTING (CABG) x 3 WITH ENDOSCOPIC HARVESTING OF RIGHT SAPHENOUS  VEIN;  Surgeon: Ivin Poot, MD;  Location: [facility];  Service: Open Heart Surgery;  Laterality: N/A;  . ESOPHAGOGASTRODUODENOSCOPY N/A 04/25/2016   Procedure: ESOPHAGOGASTRODUODENOSCOPY (EGD);  Surgeon: Rogene Houston, MD;  [facility];  Service: Endoscopy;  Laterality: N/A;  730  . LEFT HEART CATH AND CORONARY ANGIOGRAPHY N/A 08/27/2017   Procedure: LEFT HEART CATH AND CORONARY ANGIOGRAPHY;  Surgeon: Sherren Mocha, MD;  Location: [facility];  Service: Cardiovascular::  pLAD 95% - p-mLAD 50%, ostD1 90% -pD1 80%; ostOM1 90%; rPDA 80%. EF ~50-55% - HK of dital Anterolateral & Apical wall.  - Rec CVTS c/s  . RECTAL SURGERY     fissure  . SHOULDER SURGERY Left    arthroscopy in March of this year  . TEE WITHOUT CARDIOVERSION N/A 08/29/2017   Procedure: TRANSESOPHAGEAL ECHOCARDIOGRAM (TEE);  Surgeon: Prescott Gum, Collier Salina, MD;  Location: [facility];  Service: Open Heart Surgery;  Laterality: N/A;  . TRANSTHORACIC ECHOCARDIOGRAM  06/06/2017   Mild to moderate reduced EF 40 and 45%.  Anterior septal, inferoseptal and basal to mid inferior hypokinesis.  GR 1 DD.  No significant valvular lesion   Social History   Socioeconomic History  . Marital status: Single    Spouse name: Not on file  . Number of children: 0  . Years of education: 16  . Highest education level: Not on  file  Occupational History    Employer: [employer]  Social Needs  . Financial resource strain: Not on file  . Food insecurity    Worry: Not on file    Inability: Not on file  . Transportation needs    Medical: Not on file    Non-medical: Not on file  Tobacco Use  . Smoking status: Former Smoker    Types: Cigarettes    Quit date: 02/04/1971    Years since quitting: 47.7  . Smokeless tobacco: Never Used  . Tobacco comment: smoked 2 cigarettes a day  Substance and Sexual Activity  . Alcohol use: No    Alcohol/week: 0.0 standard drinks  . Drug use: No  . Sexual activity: Never  Lifestyle  .  Physical activity    Days per week: Not on file    Minutes per session: Not on file  . Stress: Not on file  Relationships  . Social Herbalist on phone: Not on file    Gets together: Not on file    Attends religious service: Not on file    Active member of club or organization: Not on file    Attends meetings of clubs or organizations: Not on file    Relationship status: Not on file  Other Topics Concern  . Not on file  Social History Narrative   Patient lives at home alone.    Patient has no children.    Patient has her masters in nursing.    Patient is single.    Patient drinks about 2 glasses of tea daily.   Patient is right handed.   Outpatient Encounter Medications as of 11/03/2018  Medication Sig  . Insulin Glargine, 1 Unit Dial, 300 UNIT/ML SOPN Inject 40 Units into the skin at bedtime.  [name] albuterol (PROVENTIL HFA;VENTOLIN HFA) 108 (90 Base) MCG/ACT inhaler Inhale 2 puffs into the lungs every 6 (six) hours as needed for wheezing or shortness of breath.  [name] aspirin EC 81 MG tablet Take 1 tablet (81 mg total) by mouth daily.  . baclofen (LIORESAL) 10 MG tablet TAKE ONE TABLET BY MOUTH AT BEDTIME.  [name] buPROPion (WELLBUTRIN XL) 300 MG 24 hr tablet TAKE ONE TABLET BY MOUTH EVERY MORNING.  . calcium carbonate (TUMS - DOSED IN MG ELEMENTAL CALCIUM) 500 MG chewable tablet Chew 2 tablets by mouth as needed for indigestion or heartburn.   . carvedilol (COREG) 6.25 MG tablet Take 1 tablet (6.25 mg total) by mouth 2 (two) times daily.  [name] EPINEPHrine (EPIPEN 2-PAK) 0.3 mg/0.3 mL IJ SOAJ injection Inject 0.3 mg into the muscle once.  [name] esomeprazole (NEXIUM) 40 MG capsule Take 40 mg by mouth at bedtime.   . ferrous sulfate 325 (65 FE) MG tablet Take 325 mg by mouth daily with breakfast.  . gabapentin (NEURONTIN) 300 MG capsule Take 1 capsule (300 mg total) by mouth 2 (two) times daily.  . hydrOXYzine (ATARAX/VISTARIL) 10 MG tablet Take 1 tablet (10 mg total) by mouth 3 (three) times  daily as needed for itching.  . Lactase (LACTAID FAST ACT) 9000 units TABS Take by mouth. Patient states that she takes as needed.  . lamoTRIgine (LAMICTAL) 150 MG tablet TAKE ONE TABLET BY MOUTH AT BEDTIME.  [name] levothyroxine (SYNTHROID, LEVOTHROID) 100 MCG tablet Take 100 mcg by mouth daily before breakfast.   . losartan (COZAAR) 25 MG tablet TAKE ONE TABLET BY MOUTH DAILY (MORNING)  . meclizine (ANTIVERT) 25 MG tablet Take 1 tablet (25  mg total) by mouth 3 (three) times daily as needed for dizziness. (Patient taking differently: Take 25 mg by mouth daily as needed for dizziness. )  . metFORMIN (GLUCOPHAGE) 1000 MG tablet Take 500 mg by mouth 2 (two) times daily with a meal.  . ondansetron (ZOFRAN) 4 MG tablet Take 1 tablet (4 mg total) by mouth 2 (two) times daily as needed for nausea or vomiting.  . potassium chloride SA (K-DUR,KLOR-CON) 20 MEQ tablet Take 1 tablet (20 mEq total) by mouth daily.  . pravastatin (PRAVACHOL) 40 MG tablet Take 1 tablet (40 mg total) by mouth every evening. (Patient not taking: Reported on 10/20/2018)  . rOPINIRole (REQUIP) 2 MG tablet Take 2 mg by mouth 3 (three) times daily. .  . torsemide (DEMADEX) 20 MG tablet Take one tablet (20mg ) alternating days with (40mg ) daily (Patient taking differently: 20 mg daily. Take one tablet (20mg ) daily)  . traMADol (ULTRAM) 50 MG tablet Take 100 mg by mouth 4 (four) times daily as needed.   . traZODone (DESYREL) 50 MG tablet Take 50 mg by mouth at bedtime.  [name] venlafaxine XR (EFFEXOR-XR) 150 MG 24 hr capsule TAKE TWO (2) CAPSULES BY MOUTH AT BEDTIME. (Patient taking differently: TAKE 300 MG BY MOUTH AT BEDTIME)  . [DISCONTINUED] Insulin Glargine (TOUJEO SOLOSTAR) 300 UNIT/ML SOPN Inject 50 Units into the skin at bedtime.   No facility-administered encounter medications on file as of 11/03/2018.    ALLERGIES: Allergies  Allergen Reactions  . Amoxicillin Anaphylaxis and Other (See Comments)    Has patient had a PCN reaction  causing immediate rash, facial/tongue/throat swelling, SOB or lightheadedness with hypotension: Yes Has patient had a PCN reaction causing severe rash involving mucus membranes or skin necrosis: Yes Has patient had a PCN reaction that required hospitalization: Yes Has patient had a PCN reaction occurring within the last 10 years: Yes If all of the above answers are "NO", then may proceed with Cephalosporin use.   [name] Hydrocodone Anaphylaxis  . Percocet [Oxycodone-Acetaminophen] Other (See Comments)    Causes chest pain /tightness. Pressure pain around her ribs.  . Vancomycin Itching  . Depacon [Valproic Acid] Other (See Comments)    Causes falls    VACCINATION STATUS: Immunization History  Administered Date(s) Administered  . Influenza,inj,Quad PF,6+ Mos 01/20/2018    Diabetes She presents for her follow-up diabetic visit. She has type 2 diabetes mellitus. Onset time: She was diagnosed at approximate age of 27 years. Her disease course has been improving. There are no hypoglycemic associated symptoms. Pertinent negatives for hypoglycemia include no confusion, headaches, pallor or seizures. Associated symptoms include fatigue. Pertinent negatives for diabetes include no chest pain, no polydipsia, no polyphagia and no polyuria. There are no hypoglycemic complications. Symptoms are improving. Diabetic complications include heart disease. (Since her last visit, she underwent coronary artery bypass graft for 3 coronary vessels.) Risk factors for coronary artery disease include diabetes mellitus, dyslipidemia, hypertension, obesity and sedentary lifestyle. Current diabetic treatment includes oral agent (dual therapy) and insulin injections. She is compliant with treatment most of the time. Her weight is stable. She is following a generally unhealthy diet. She has not had a previous visit with a dietitian. She never participates in exercise. Her breakfast blood glucose range is generally 130-140 mg/dl.  Her lunch blood glucose range is generally 130-140 mg/dl. Her dinner blood glucose range is generally 130-140 mg/dl. Her bedtime blood glucose range is generally 130-140 mg/dl. Her overall blood glucose range is 130-140 mg/dl. An ACE inhibitor/angiotensin II  receptor blocker is being taken. Eye exam is current.  Hypertension This is a chronic problem. The current episode started more than 1 year ago. Pertinent negatives include no chest pain, headaches, palpitations or shortness of breath. Past treatments include ACE inhibitors. Compliance problems include diet, exercise, medication cost, medication side effects and psychosocial issues.  Identifiable causes of hypertension include a thyroid problem.  Hyperlipidemia This is a chronic problem. The current episode started more than 1 year ago. Pertinent negatives include no chest pain, myalgias or shortness of breath. Current antihyperlipidemic treatment includes statins. Risk factors for coronary artery disease include a sedentary lifestyle, obesity, hypertension, diabetes mellitus and dyslipidemia.  Thyroid Problem Presents for follow-up visit. Symptoms include fatigue. Patient reports no cold intolerance, diarrhea, heat intolerance or palpitations. The symptoms have been stable. Past treatments include levothyroxine. Her past medical history is significant for hyperlipidemia.     Review of Systems  Constitutional: Positive for fatigue. Negative for unexpected weight change.  HENT: Negative for trouble swallowing and voice change.   Eyes: Negative for visual disturbance.  Respiratory: Negative for cough, shortness of breath and wheezing.   Cardiovascular: Negative for chest pain, palpitations and leg swelling.  Gastrointestinal: Negative for diarrhea, nausea and vomiting.  Endocrine: Negative for cold intolerance, heat intolerance, polydipsia, polyphagia and polyuria.  Musculoskeletal: Negative for arthralgias and myalgias.  Skin: Negative for  color change, pallor, rash and wound.  Neurological: Negative for seizures and headaches.  Psychiatric/Behavioral: Negative for confusion and suicidal ideas.    Objective:    There were no vitals taken for this visit.  Wt Readings from Last 3 Encounters:  10/20/18 239 lb 3.2 oz (108.5 kg)  09/16/18 245 lb (111.1 kg)  08/14/18 243 lb (110.2 kg)      CMP Latest Ref Rng & Units 10/30/2018 04/24/2018 01/29/2018  Glucose 70 - 99 mg/dL 96 - 163(H)  BUN 8 - 23 mg/dL 15 13 20   Creatinine 0.44 - 1.00 mg/dL 1.27(H) 0.9 1.10(H)  Sodium 135 - 145 mmol/L 137 - 137  Potassium 3.5 - 5.1 mmol/L 3.4(L) - 4.2  Chloride 98 - 111 mmol/L 98 - 91(L)  CO2 22 - 32 mmol/L 28 - 33(H)  Calcium 8.9 - 10.3 mg/dL 8.9 - 8.5(L)  Total Protein 6.5 - 8.1 g/dL 6.4(L) - 7.1  Total Bilirubin 0.3 - 1.2 mg/dL 0.4 - 0.2  Alkaline Phos 38 - 126 U/L 42 - -  AST 15 - 41 U/L 15 - 11  ALT 0 - 44 U/L 12 - 12   Lipid Panel     Component Value Date/Time   CHOL 176 04/24/2018   TRIG 114 04/24/2018   HDL 59 04/24/2018   CHOLHDL 3.4 07/10/2016 0934   VLDL 31 (H) 07/10/2016 0934   LDLCALC 96 04/24/2018     Diabetic Labs (most recent): Lab Results  Component Value Date   HGBA1C 6.4 (H) 10/30/2018   HGBA1C 7.2 04/24/2018   HGBA1C 7.7 (H) 01/29/2018    Assessment & Plan:   1. Uncontrolled type 2 diabetes mellitus with coronary artery disease status post coronary artery bypass graft   Her diabetes is  complicated by coronary artery disease, neuropathy and patient remains at a high risk for more acute and chronic complications of diabetes which include CAD, CVA, CKD, retinopathy, and neuropathy. These are all discussed in detail with the patient. -She reports near target glycemic profile both fasting and postprandial.  Her previsit labs show A1c of 6.4%, improving from 7.2%.  Of note, she  has mild anemia on treatment.   - I have re-counseled the patient on diet management and weight loss  by adopting a carbohydrate  restricted / protein rich  Diet.  - she  admits there is a room for improvement in her diet and drink choices. -  Suggestion is made for her to avoid simple carbohydrates  from her diet including Cakes, Sweet Desserts / Pastries, Ice Cream, Soda (diet and regular), Sweet Tea, Candies, Chips, Cookies, Sweet Pastries,  Store Bought Juices, Alcohol in Excess of  1-2 drinks a day, Artificial Sweeteners, Coffee Creamer, and "Sugar-free" Products. This will help patient to have stable blood glucose profile and potentially avoid unintended weight gain.   - Patient is advised to stick to a routine mealtimes to eat 3 meals  a day and avoid unnecessary snacks ( to snack only to correct hypoglycemia).  - I have approached patient with the following individualized plan to manage diabetes and patient agrees.  -She is advised to lower her Toujeo to 40  units nightly, associated with monitoring of blood glucose twice a day-daily before breakfast and at bedtime.    -She is advised to lower metformin to 500 mg by mouth twice a day   with meals-after breakfast and after supper.   - Patient will be considered for incretin therapy as appropriate next visit. - Patient specific target  for A1c; LDL, HDL, Triglycerides, and  Waist Circumference were discussed in detail.  2) BP/HTN: she is advised to home monitor blood pressure and report if > 140/90 on 2 separate readings.  -She is advised to continue on her current medications including Lasix, metoprolol.  3) Lipids/HPL: Her recent lipid panel showed LDL uncontrolled at 83.  She is advised to continue Lipitor 10 mg p.o. daily.    4)  Weight/Diet: CDE consult in progress, exercise, and carbohydrates information provided.  5) Hypothyroidism  - Her previsit thyroid function tests are consistent with appropriate replacement.    She is now on amiodarone 200 mg p.o. twice daily. -She is advised to continue levothyroxine 100 mcg p.o. every morning.    - We discussed  about the correct intake of her thyroid hormone, on empty stomach at fasting, with water, separated by at least 30 minutes from breakfast and other medications,  and separated by more than 4 hours from calcium, iron, multivitamins, acid reflux medications (PPIs). -Patient is made aware of the fact that thyroid hormone replacement is needed for life, dose to be adjusted by periodic monitoring of thyroid function tests.   6) Chronic Care/Health Maintenance:  -Patient is on ACEI/ARB and Statin medications and encouraged to continue to follow up with Ophthalmology, Podiatrist at least yearly or according to recommendations, and advised to  stay away from smoking. I have recommended yearly flu vaccine and pneumonia vaccination at least every 5 years; moderate intensity exercise for up to 150 minutes weekly; and  sleep for at least 7 hours a day.  I advised patient to maintain close follow up with her PCP for primary care needs.  - Patient Care Time Today:  25 min, of which >50% was spent in reviewing her  current and  previous labs/studies, previous treatments, her blood glucose logs, and medications doses and developing a plan for long-term care based on the latest recommendations for standards of care.  Norma [name] participated in the discussions, expressed understanding, and voiced agreement with the above plans.  All questions were answered to her satisfaction. she is encouraged to contact  clinic should she have any questions or concerns prior to her return visit.   Follow up plan: Return in about 4 months (around 03/06/2019) for Follow up with Pre-visit Labs, Meter, and Logs.  Glade Lloyd, MD Phone: [phone]  Fax: [phone]  This note was partially dictated with voice recognition software. Similar sounding words can be transcribed inadequately or may not  be corrected upon review.  11/03/2018, 3:26 PM

## 2018-11-04 DIAGNOSIS — L308 Other specified dermatitis: Secondary | ICD-10-CM | POA: Diagnosis not present

## 2018-11-04 DIAGNOSIS — L304 Erythema intertrigo: Secondary | ICD-10-CM | POA: Diagnosis not present

## 2018-11-05 ENCOUNTER — Encounter

## 2018-11-06 ENCOUNTER — Ambulatory Visit (HOSPITAL_COMMUNITY): Payer: Medicare Other

## 2018-11-06 ENCOUNTER — Telehealth (HOSPITAL_COMMUNITY): Payer: Self-pay

## 2018-11-06 NOTE — Telephone Encounter (Signed)
She will not be back in time to get here from her son's MD apptment. She will see [place] Monday

## 2018-11-09 ENCOUNTER — Ambulatory Visit (HOSPITAL_COMMUNITY): Payer: Medicare Other | Admitting: Physical Therapy

## 2018-11-09 ENCOUNTER — Telehealth (HOSPITAL_COMMUNITY): Payer: Self-pay | Admitting: Pulmonary Disease

## 2018-11-09 NOTE — Telephone Encounter (Signed)
11/09/18  left a message that she had to cancel today's appt but would be at the next scheduled appt.

## 2018-11-12 ENCOUNTER — Telehealth (HOSPITAL_COMMUNITY): Payer: Self-pay | Admitting: Physical Therapy

## 2018-11-12 NOTE — Telephone Encounter (Signed)
Pt called and said she would be her tomorrow. NF 11/12/2018

## 2018-11-13 ENCOUNTER — Ambulatory Visit (HOSPITAL_COMMUNITY): Payer: Medicare Other | Admitting: Physical Therapy

## 2018-11-13 ENCOUNTER — Other Ambulatory Visit: Payer: Self-pay

## 2018-11-13 ENCOUNTER — Encounter (HOSPITAL_COMMUNITY): Payer: Self-pay | Admitting: Physical Therapy

## 2018-11-13 DIAGNOSIS — R2689 Other abnormalities of gait and mobility: Secondary | ICD-10-CM | POA: Diagnosis not present

## 2018-11-13 DIAGNOSIS — R2681 Unsteadiness on feet: Secondary | ICD-10-CM | POA: Diagnosis not present

## 2018-11-13 DIAGNOSIS — M6281 Muscle weakness (generalized): Secondary | ICD-10-CM | POA: Diagnosis not present

## 2018-11-13 NOTE — Therapy (Signed)
[place], [place], [address] Phone: [phone]   Fax:  [phone]  Physical Therapy Treatment  Patient Details  Name: Norma [name] MRN: [id number] Date of Birth: 1950-11-21 Referring Provider (PT): Kathrynn Ducking, MD   Encounter Date: 11/13/2018  PT End of Session - 11/13/18 1644    Visit Number  1    Number of Visits  8    Date for PT Re-Evaluation  11/27/18    Authorization Type  Primary: Medicare; Secondary: [employer] supplement    Authorization Time Period  10/30/18 - 11/27/18    Authorization - Visit Number  1    Authorization - Number of Visits  10    PT Start Time  1633    PT Stop Time  1712    PT Time Calculation (min)  39 min    Equipment Utilized During Treatment  Gait belt    Activity Tolerance  Patient tolerated treatment well;Patient limited by fatigue    Behavior During Therapy  [facility] for tasks assessed/performed       Past Medical History:  Diagnosis Date  . Anemia   . Anxiety   . Bipolar disorder ([place])   . Bulging lumbar disc    L3-4  . Chronic daily headache   . Chronic low back pain 09/20/2014  . COPD (chronic obstructive pulmonary disease) ([place])   . Degenerative arthritis   . Depression   . Diabetes mellitus, type II ([place])   . Diabetic neuropathy ([place]) 09/16/2018  . DM type 2 with diabetic peripheral neuropathy ([place]) 09/20/2014  . Dyslipidemia   . Dyspnea   . Gastroparesis   . GERD (gastroesophageal reflux disease)   . Heart murmur   . History of hiatal hernia   . Hypothyroidism   . IBS (irritable bowel syndrome)   . Memory difficulty 09/20/2014  . Morbid obesity ([place])   . Neuropathy   . Obstructive sleep apnea on CPAP   . Restless legs syndrome (RLS) 09/17/2012  . Stroke (cerebrum) ([place]) 05/16/2017   Left parietal    Past Surgical History:  Procedure Laterality Date  . ABDOMINAL HYSTERECTOMY    . ARTHROSCOPY KNEE W/ DRILLING  06/2011   and Decemer of 2013.  [name] Carpel  tunnel  1980's  . CATARACT EXTRACTION W/PHACO Right 03/21/2017   Procedure: CATARACT EXTRACTION PHACO AND INTRAOCULAR LENS PLACEMENT RIGHT EYE;  Surgeon: Baruch Goldmann, MD;  Location: [facility];  Service: Ophthalmology;  Laterality: Right;  CDE: 2.91   . CATARACT EXTRACTION W/PHACO Left 04/18/2017   Procedure: CATARACT EXTRACTION PHACO AND INTRAOCULAR LENS PLACEMENT LEFT EYE;  Surgeon: Baruch Goldmann, MD;  Location: [facility];  Service: Ophthalmology;  Laterality: Left;  left  . CHOLECYSTECTOMY    . COLONOSCOPY WITH PROPOFOL N/A 10/04/2016   Procedure: COLONOSCOPY WITH PROPOFOL;  Surgeon: Rogene Houston, MD;  [facility];  Service: Endoscopy;  Laterality: N/A;  11:10  . CORONARY ARTERY BYPASS GRAFT N/A 08/29/2017   Procedure: CORONARY ARTERY BYPASS GRAFTING (CABG) x 3 WITH ENDOSCOPIC HARVESTING OF RIGHT SAPHENOUS VEIN;  Surgeon: Ivin Poot, MD;  Location: [facility];  Service: Open Heart Surgery;  Laterality: N/A;  . ESOPHAGOGASTRODUODENOSCOPY N/A 04/25/2016   Procedure: ESOPHAGOGASTRODUODENOSCOPY (EGD);  Surgeon: Rogene Houston, MD;  [facility];  Service: Endoscopy;  Laterality: N/A;  730  . LEFT HEART CATH AND CORONARY ANGIOGRAPHY N/A 08/27/2017   Procedure: LEFT HEART CATH AND CORONARY ANGIOGRAPHY;  Surgeon: Sherren Mocha,  MD;  Location: [place] [facility];  Service: Cardiovascular::  pLAD 95% - p-mLAD 50%, ostD1 90% -pD1 80%; ostOM1 90%; rPDA 80%. EF ~50-55% - HK of dital Anterolateral & Apical wall.  - Rec CVTS c/s  . RECTAL SURGERY     fissure  . SHOULDER SURGERY Left    arthroscopy in March of this year  . TEE WITHOUT CARDIOVERSION N/A 08/29/2017   Procedure: TRANSESOPHAGEAL ECHOCARDIOGRAM (TEE);  Surgeon: Prescott Gum, Collier Salina, MD;  Location: [facility];  Service: Open Heart Surgery;  Laterality: N/A;  . TRANSTHORACIC ECHOCARDIOGRAM  06/06/2017   Mild to moderate reduced EF 40 and 45%.  Anterior septal, inferoseptal and basal to mid inferior hypokinesis.  GR 1 DD.  No  significant valvular lesion    There were no vitals filed for this visit.  Subjective Assessment - 11/13/18 1634    Subjective  Patient reported her back an knees are hurting today that she rated as a 10/10.    Pertinent History  COPD, CABG, History of TIAs, DMII, Bipolar, wears CPAP at night    Limitations  Walking;Standing;House hold activities    How long can you sit comfortably?  Hours    How long can you stand comfortably?  0 minutes    How long can you walk comfortably?  1 minute    Diagnostic tests  MRI Lumbar: DDD no neural impingement    Patient Stated Goals  To not fall    Currently in Pain?  Yes    Pain Score  10-Worst pain ever    Pain Location  Back    Pain Orientation  Lower    Pain Descriptors / Indicators  Sharp;Aching   Knees aching   Pain Onset  More than a month ago                       [facility] Adult PT Treatment/Exercise - 11/13/18 0001      Exercises   Exercises  Knee/Hip      Knee/Hip Exercises: Stretches   Passive Hamstring Stretch  Right;Left;3 reps;30 seconds      Knee/Hip Exercises: Seated   Long Arc Quad  Strengthening;Right;Left;1 set;10 reps    Other Seated Knee/Hip Exercises  Seated rows for postural improvement x15; posterior shoulder rolls x 10    Other Seated Knee/Hip Exercises  Heel/toe raises x 10 each in sitting      Knee/Hip Exercises: Supine   Bridges  5 reps    Bridges Limitations  Partial ROM    Other Supine Knee/Hip Exercises  Glute set x 10 5'' holds      Knee/Hip Exercises: Sidelying   Clams  10x each LE             PT Education - 11/13/18 1635    Education Details  Educated on goals.    Person(s) Educated  Patient    Methods  Explanation    Comprehension  Verbalized understanding       PT Short Term Goals - 11/13/18 1635      PT SHORT TERM GOAL #1   Title  Patient will report understanding and regular compliance with HEP to imrpove flexibility, strength, and overall functional mobility.    Time   2    Period  Weeks    Status  On-going    Target Date  11/13/18        PT Long Term Goals - 11/13/18 1636      PT LONG TERM GOAL #  1   Title  Patient will ambulate an additionl 50 feet with LRAD on the 2MWT indicating improved endurance and overall functional mobility.    Time  4    Period  Weeks    Status  On-going      PT LONG TERM GOAL #2   Title  Patient will demonstrate improvement of at least 1/2 MMT strength grade in all deficient muscle groups indicating improved strength to improve gait mehanics and safety.    Time  4    Period  Weeks    Status  On-going      PT LONG TERM GOAL #3   Title  Patient will demonstrate imrovement of at least 10% on FOTO indicating imrpoved overall perceived functional mobility.    Time  4    Period  Weeks    Status  On-going      PT LONG TERM GOAL #4   Title  Patient will be educated on safety tips to reduce falls and report understanding to improve patient's overall safety and mobility at home.    Time  4    Period  Weeks    Status  On-going      PT LONG TERM GOAL #5   Title  Patient will report improvement in ability to walk for at least 10 minutes in order to begin going to stores to do shopping.    Time  4    Period  Weeks    Status  On-going            Plan - 11/13/18 1724    Clinical Impression Statement  Began session by reviewing patient's evaluation and goals. Then reviewed patient's HEP with patient requiring minimal verbal cues and demonstration for form. Then worked on seated exercises to improve lower extremity strengthening and postural awareness/strength. Finally ended with supine/sidelying exercises for hip strengthening. Patient had difficulty with completing bridges due to back pain, but was able to complete 5 this date. Alternately had patient perform glute sets. Updated patient's HEP this session. Patient would benefit from continued skilled physical therapy in order to continue progressing towards functional  goals.    Personal Factors and Comorbidities  Time since onset of injury/illness/exacerbation;Comorbidity 3+    Comorbidities  HTN, DMII, BP, Hx of TIAs, Hx of CABG surgery, CAD    Examination-Activity Limitations  Squat;Locomotion Level;Stand;Bend;Bed Mobility    Examination-Participation Restrictions  Shop;Cleaning;Community Activity    Stability/Clinical Decision Making  Evolving/Moderate complexity    Rehab Potential  Fair    PT Frequency  2x / week    PT Duration  4 weeks    PT Treatment/Interventions  ADLs/Self Care Home Management;Aquatic Therapy;Cryotherapy;Electrical Stimulation;DME Instruction;Gait training;Stair training;Functional mobility training;Therapeutic activities;Therapeutic exercise;Balance training;Neuromuscular re-education;Patient/family education;Manual techniques;Orthotic Fit/Training;Passive range of motion;Dry needling;Energy conservation;Taping    PT Next Visit Plan  Balance, gait training. Progress to standing functional lower extremity strengthening as able.    PT Home Exercise Plan  10/30/18: Hamstring stretch seated 3x30'' 1x/day; 11/13/18: Posterior shoulder rolls x10, glute sets x 10, LAQ x 10 each LE all 1-2x/day    Consulted and Agree with Plan of Care  Patient       Patient will benefit from skilled therapeutic intervention in order to improve the following deficits and impairments:  Abnormal gait, Improper body mechanics, Pain, Decreased mobility, Postural dysfunction, Decreased activity tolerance, Decreased endurance, Decreased range of motion, Decreased strength, Decreased balance, Difficulty walking, Impaired flexibility, Obesity  Visit Diagnosis: 1. Other abnormalities of gait and mobility  2. Muscle weakness (generalized)   3. Unsteadiness on feet        Problem List Patient Active Problem List   Diagnosis Date Noted  . Diabetic neuropathy ([place]) 09/16/2018  . Unilateral primary osteoarthritis, left knee 09/16/2018  . Class 3 severe obesity  due to excess calories with serious comorbidity and body mass index (BMI) of 45.0 to 49.9 in adult ([place]) 02/20/2018  . Mixed hyperlipidemia 09/11/2017  . Pressure injury of skin 09/02/2017  . Coronary artery disease involving native heart without angina pectoris   . S/P CABG x 3   . Leukocytosis   . Acute blood loss anemia   . Post-operative pain   . Bipolar affective disorder ([place])   . Diabetes mellitus type 2 in obese ([place])   . History of CVA (cerebrovascular accident)   . Hypokalemia   . Hx of CABG 08/29/2017  . Coronary artery disease involving native coronary artery of native heart with unstable angina pectoris ([place]) 08/28/2017  . Unstable angina ([place]) 08/27/2017  . Cardiomyopathy ([place]) 08/22/2017  . Stroke (cerebrum) ([place]) 05/16/2017  . Low hemoglobin 09/19/2016  . Dizziness 06/21/2016  . Chest pain with moderate risk for cardiac etiology 04/23/2016  . Bipolar I disorder, most recent episode depressed ([place]) 06/05/2015  . DM type 2 causing vascular disease ([place]) 09/20/2014  . Chronic bilateral low back pain with bilateral sciatica 09/20/2014  . Morbid obesity, unspecified obesity type ([place]) 09/20/2014  . Memory difficulty 09/20/2014  . Lumbosacral spondylosis without myelopathy 12/25/2012  . Gastroparesis 11/04/2012  . Nausea with vomiting 11/04/2012  . Restless legs syndrome (RLS) 09/17/2012  . Headache(784.0) 03/19/2012  . GERD (gastroesophageal reflux disease) 02/16/2012  . Abdominal pain 02/16/2012  . Hypothyroidism 02/16/2012  . Tremor 02/16/2012  . Hyperlipidemia associated with type 2 diabetes mellitus ([place]) 04/01/2008  . Obstructive sleep apnea 04/01/2008  . Essential hypertension, benign 04/01/2008  . LBBB (left bundle branch block) 04/01/2008   Clarene Critchley PT, DPT 5:28 PM, 11/13/18 [place], [place], [address] Phone: [phone]   Fax:  [phone]  Name: TAKERA RAYL MRN: [id number] Date of Birth: 1950/10/19

## 2018-11-13 NOTE — Patient Instructions (Addendum)
Long [employer]    Straighten operated leg and try to hold it __2-3__ seconds. (Do not need to use ankle weights) Repeat __10__ times. Do _1-2___ sessions a day.  http://gt2.exer.us/311   Copyright  VHI. All rights reserved.  Roll    Inhale and bring shoulders up, back, then exhale and relax shoulders down. Repeat _10__ times. Do _1-2__ times per day.  Copyright  VHI. All rights reserved.  Gluteal Sets    Squeeze pelvic floor and hold. Tighten bottom. Hold for _5__ seconds. Relax for _5__ seconds. Repeat _10__ times. Do _1-2__ times a day.   Copyright  VHI. All rights reserved.

## 2018-11-18 ENCOUNTER — Encounter (HOSPITAL_COMMUNITY): Payer: Self-pay

## 2018-11-18 ENCOUNTER — Ambulatory Visit (HOSPITAL_COMMUNITY): Payer: Medicare Other

## 2018-11-18 ENCOUNTER — Telehealth (HOSPITAL_COMMUNITY): Payer: Self-pay

## 2018-11-18 NOTE — Therapy (Unsigned)
[place], [place], [address] Phone: [phone]   Fax:  [phone]  Patient Details  Name: Norma [name] MRN: [id number] Date of Birth: 03/22/1951 Referring Provider:  No ref. provider found  Encounter Date: 11/18/2018  PHYSICAL THERAPY DISCHARGE SUMMARY  Visits from Start of Care: 2  Current functional level related to goals / functional outcomes: See last treatment note   Remaining deficits: See last treatment note   Education / Equipment: See last treatment note  Plan: Patient agrees to discharge.  Patient goals were not met. Patient is being discharged due to the patient's request.  ?????       This therapist called the pt to educate her on missed appointment today 11/18/18 at 9:15. Pt reports she left a voicemail regarding wanting to cancel appointment today and be discharged from therapy. Pt reports she was up all night again due to her legs and states "there's no use in doing physical therapy if I can't use them" and that she wants to be discharged today until the doctor figures out what is wrong with her legs. PT again asked pt if she wanted to be discharged and pt verbalized wanting to d/c today over the phone.   Talbot Grumbling PT, DPT 11/18/18, 9:41 AM [place], [place], [address] Phone: [phone]   Fax:  [phone]

## 2018-11-18 NOTE — Telephone Encounter (Signed)
No Show#1. This therapist called the pt to educate her on missed appointment today 11/18/18 at 9:15. Pt reports she left a voicemail regarding wanting to cancel appointment today and be discharged from therapy. Pt reports she was up all night again due to her legs and states "there's no use in doing physical therapy if I can't use them" and that she wants to be discharged today until the doctor figures out what is wrong with her legs.  Talbot Grumbling PT, DPT 11/18/18, 9:38 AM [phone]

## 2018-11-20 ENCOUNTER — Ambulatory Visit (HOSPITAL_COMMUNITY): Payer: Medicare Other | Admitting: Physical Therapy

## 2018-11-23 ENCOUNTER — Ambulatory Visit (HOSPITAL_COMMUNITY): Payer: Medicare Other | Admitting: Physical Therapy

## 2018-11-25 ENCOUNTER — Ambulatory Visit (HOSPITAL_COMMUNITY): Payer: Medicare Other | Admitting: Physical Therapy

## 2018-11-30 ENCOUNTER — Other Ambulatory Visit: Payer: Self-pay | Admitting: Neurology

## 2018-11-30 ENCOUNTER — Other Ambulatory Visit: Payer: Self-pay | Admitting: Cardiology

## 2018-11-30 ENCOUNTER — Other Ambulatory Visit: Payer: Self-pay

## 2018-11-30 MED ORDER — METFORMIN HCL 500 MG PO TABS: 500.0000 mg | ORAL_TABLET | Freq: Two times a day (BID) | ORAL | 0 refills | Status: DC

## 2018-12-17 ENCOUNTER — Telehealth: Payer: Self-pay | Admitting: Orthopaedic Surgery

## 2018-12-17 ENCOUNTER — Encounter: Payer: Self-pay | Admitting: Orthopaedic Surgery

## 2018-12-17 ENCOUNTER — Ambulatory Visit (INDEPENDENT_AMBULATORY_CARE_PROVIDER_SITE_OTHER): Payer: Medicare Other | Admitting: Orthopaedic Surgery

## 2018-12-17 ENCOUNTER — Ambulatory Visit: Payer: Medicare Other | Admitting: Orthopaedic Surgery

## 2018-12-17 ENCOUNTER — Other Ambulatory Visit: Payer: Self-pay

## 2018-12-17 VITALS — BP 66/34 | HR 89 | Ht 62.0 in | Wt 237.0 lb

## 2018-12-17 DIAGNOSIS — M1711 Unilateral primary osteoarthritis, right knee: Secondary | ICD-10-CM

## 2018-12-17 MED ORDER — METHYLPREDNISOLONE ACETATE 40 MG/ML IJ SUSP
80.0000 mg | INTRAMUSCULAR | Status: AC | PRN
  Administered 2018-12-17: 80 mg via INTRA_ARTICULAR

## 2018-12-17 MED ORDER — LIDOCAINE HCL 1 % IJ SOLN
2.0000 mL | INTRAMUSCULAR | Status: AC | PRN
  Administered 2018-12-17: 12:00:00 2 mL

## 2018-12-17 MED ORDER — BUPIVACAINE HCL 0.5 % IJ SOLN
2.0000 mL | INTRAMUSCULAR | Status: AC | PRN
  Administered 2018-12-17: 2 mL via INTRA_ARTICULAR

## 2018-12-17 NOTE — Progress Notes (Signed)
Office Visit Note   Patient: Norma [name]           Date of Birth: [birth year]           MRN: [id number] Visit Date: 12/17/2018              Requested by: Sinda Du, MD [address] [place],  [place] [address] PCP: Sinda Du, MD   Assessment & Plan: Visit Diagnoses:  1. Unilateral primary osteoarthritis, right knee     Plan:  #1: Corticosteroid injection to the right knee was performed by Dr. Durward Fortes without difficulty. #2: Due to the fact that her BMI is elevated and at this time she is not a candidate for surgical intervention we feel that Visco supplementation would be our next step.  We will apply for this. #3: Follow back up once approved. #4: Long discussion about her knees and she understands that because of her weight she is not a candidate for TKR at this time.   This patient is diagnosed with osteoarthritis of the knee(s).    Radiographs show evidence of joint space narrowing, osteophytes, subchondral sclerosis and/or subchondral cysts.  This patient has knee pain which interferes with functional and activities of daily living.    This patient has experienced inadequate response, adverse effects and/or intolerance with conservative treatments such as acetaminophen, NSAIDS, topical creams, physical therapy or regular exercise, knee bracing and/or weight loss.   This patient has experienced inadequate response or has a contraindication to intra articular steroid injections for at least 3 months.   This patient is not scheduled to have a total knee replacement within 6 months of starting treatment with viscosupplementation.  Follow-Up Instructions: No follow-ups on file.   Orders:  No orders of the defined types were placed in this encounter.  No orders of the defined types were placed in this encounter.     Procedures: Large Joint Inj: R knee on 12/17/2018 11:38 AM Indications: pain and diagnostic evaluation Details: 25 G 1.5 in needle,  anteromedial approach  Arthrogram: No  Medications: 2 mL lidocaine 1 %; 2 mL bupivacaine 0.5 %; 80 mg methylPREDNISolone acetate 40 MG/ML Procedure, treatment alternatives, risks and benefits explained, specific risks discussed. Consent was given by the patient. Immediately prior to procedure a time out was called to verify the correct patient, procedure, equipment, support staff and site/side marked as required. Patient was prepped and draped in the usual sterile fashion.       Clinical Data: No additional findings.   Subjective: Chief Complaint  Patient presents with  . Right Knee - Pain  HPI Patient presents today with recurrent bilateral knee pain. She said that the right side hurts worse. She said that both started hurting again 4 days ago. She had cortisone injections last September and October and states that they did not help.   Safe travels sulfonic places like our parking lot to pull Review of Systems  Constitutional: Positive for fatigue.  HENT: Negative for ear pain.   Eyes: Negative for pain.  Respiratory: Positive for shortness of breath.   Cardiovascular: Negative for leg swelling.  Gastrointestinal: Negative for constipation and diarrhea.  Endocrine: Positive for heat intolerance. Negative for cold intolerance.  Genitourinary: Negative for difficulty urinating.  Musculoskeletal: Negative for joint swelling.  Skin: Negative for rash.  Allergic/Immunologic: Positive for food allergies.  Neurological: Positive for weakness.  Hematological: Does not bruise/bleed easily.  Psychiatric/Behavioral: Positive for sleep disturbance.     Objective: Vital Signs: BP [name])  66/34   Pulse 89   Ht 5\' 2"  (1.575 m)   Wt 237 lb (107.5 kg)   BMI 43.35 kg/m   Physical Exam Constitutional:      Appearance: She is well-developed.  Eyes:     Pupils: Pupils are equal, round, and reactive to light.  Pulmonary:     Effort: Pulmonary effort is normal.  Skin:    General: Skin  is warm and dry.  Neurological:     Mental Status: She is alert and oriented to person, place, and time.  Psychiatric:        Behavior: Behavior normal.     Ortho Exam  Exam today reveals decreased range of motion of the right knee.  She lacks around 10 degrees of full extension.  Flexion to about 90 to 95 degrees.  Difficult to feel landmarks because of the size of her in the.  Specialty Comments:  No specialty comments available.  Imaging: No results found.   PMFS History: Current Outpatient Medications  Medication Sig Dispense Refill  . aspirin EC 81 MG tablet Take 1 tablet (81 mg total) by mouth daily. 90 tablet 3  . baclofen (LIORESAL) 10 MG tablet TAKE ONE TABLET BY MOUTH AT BEDTIME. 30 tablet 6  . buPROPion (WELLBUTRIN XL) 300 MG 24 hr tablet TAKE ONE TABLET BY MOUTH EVERY MORNING. 30 tablet 2  . carvedilol (COREG) 6.25 MG tablet TAKE ONE TABLET BY MOUTH BID(MORNING ,EVENING) 180 tablet 3  . EPINEPHrine (EPIPEN 2-PAK) 0.3 mg/0.3 mL IJ SOAJ injection Inject 0.3 mg into the muscle once.    [name] esomeprazole (NEXIUM) 40 MG capsule Take 40 mg by mouth at bedtime.   12  . gabapentin (NEURONTIN) 300 MG capsule Take 1 capsule (300 mg total) by mouth 2 (two) times daily.    . hydrOXYzine (ATARAX/VISTARIL) 10 MG tablet Take 1 tablet (10 mg total) by mouth 3 (three) times daily as needed for itching. 30 tablet 0  . Insulin Glargine, 1 Unit Dial, 300 UNIT/ML SOPN Inject 40 Units into the skin at bedtime.    . lamoTRIgine (LAMICTAL) 150 MG tablet TAKE ONE TABLET BY MOUTH AT BEDTIME. 30 tablet 2  . levothyroxine (SYNTHROID, LEVOTHROID) 100 MCG tablet Take 100 mcg by mouth daily before breakfast.     . losartan (COZAAR) 25 MG tablet TAKE ONE TABLET BY MOUTH DAILY (MORNING) 90 tablet 1  . meclizine (ANTIVERT) 25 MG tablet Take 1 tablet (25 mg total) by mouth 3 (three) times daily as needed for dizziness. (Patient taking differently: Take 25 mg by mouth daily as needed for dizziness. ) 30 tablet  0  . metFORMIN (GLUCOPHAGE) 500 MG tablet Take 1 tablet (500 mg total) by mouth 2 (two) times daily with a meal. 180 tablet 0  . ondansetron (ZOFRAN) 4 MG tablet Take 1 tablet (4 mg total) by mouth 2 (two) times daily as needed for nausea or vomiting. 30 tablet 1  . potassium chloride SA (K-DUR,KLOR-CON) 20 MEQ tablet Take 1 tablet (20 mEq total) by mouth daily. 90 tablet 2  . rOPINIRole (REQUIP) 2 MG tablet Take 2 mg by mouth 3 (three) times daily. .    . torsemide (DEMADEX) 20 MG tablet Take one tablet (20mg ) alternating days with (40mg ) daily (Patient taking differently: 20 mg daily. Take one tablet (20mg ) daily) 135 tablet 3  . traMADol (ULTRAM) 50 MG tablet Take 100 mg by mouth 4 (four) times daily as needed.     . traZODone (DESYREL) 50 MG  tablet Take 50 mg by mouth at bedtime.  5  . venlafaxine XR (EFFEXOR-XR) 150 MG 24 hr capsule TAKE TWO (2) CAPSULES BY MOUTH AT BEDTIME. (Patient taking differently: TAKE 300 MG BY MOUTH AT BEDTIME) 60 capsule 2  . albuterol (PROVENTIL HFA;VENTOLIN HFA) 108 (90 Base) MCG/ACT inhaler Inhale 2 puffs into the lungs every 6 (six) hours as needed for wheezing or shortness of breath.    . calcium carbonate (TUMS - DOSED IN MG ELEMENTAL CALCIUM) 500 MG chewable tablet Chew 2 tablets by mouth as needed for indigestion or heartburn.     . ferrous sulfate 325 (65 FE) MG tablet Take 325 mg by mouth daily with breakfast.    . Lactase (LACTAID FAST ACT) 9000 units TABS Take by mouth. Patient states that she takes as needed.    . pravastatin (PRAVACHOL) 40 MG tablet Take 1 tablet (40 mg total) by mouth every evening. (Patient not taking: Reported on 10/20/2018) 90 tablet 1   No current facility-administered medications for this visit.     Patient Active Problem List   Diagnosis Date Noted  . Diabetic neuropathy ([place]) 09/16/2018  . Unilateral primary osteoarthritis, left knee 09/16/2018  . Class 3 severe obesity due to excess calories with serious comorbidity and body  mass index (BMI) of 45.0 to 49.9 in adult ([place]) 02/20/2018  . Mixed hyperlipidemia 09/11/2017  . Pressure injury of skin 09/02/2017  . Coronary artery disease involving native heart without angina pectoris   . S/P CABG x 3   . Leukocytosis   . Acute blood loss anemia   . Post-operative pain   . Bipolar affective disorder ([place])   . Diabetes mellitus type 2 in obese ([place])   . History of CVA (cerebrovascular accident)   . Hypokalemia   . Hx of CABG 08/29/2017  . Coronary artery disease involving native coronary artery of native heart with unstable angina pectoris ([place]) 08/28/2017  . Unstable angina ([place]) 08/27/2017  . Cardiomyopathy ([place]) 08/22/2017  . Stroke (cerebrum) ([place]) 05/16/2017  . Low hemoglobin 09/19/2016  . Dizziness 06/21/2016  . Chest pain with moderate risk for cardiac etiology 04/23/2016  . Bipolar I disorder, most recent episode depressed ([place]) 06/05/2015  . DM type 2 causing vascular disease ([place]) 09/20/2014  . Chronic bilateral low back pain with bilateral sciatica 09/20/2014  . Morbid obesity, unspecified obesity type ([place]) 09/20/2014  . Memory difficulty 09/20/2014  . Lumbosacral spondylosis without myelopathy 12/25/2012  . Gastroparesis 11/04/2012  . Nausea with vomiting 11/04/2012  . Restless legs syndrome (RLS) 09/17/2012  . Headache(784.0) 03/19/2012  . GERD (gastroesophageal reflux disease) 02/16/2012  . Abdominal pain 02/16/2012  . Hypothyroidism 02/16/2012  . Tremor 02/16/2012  . Hyperlipidemia associated with type 2 diabetes mellitus ([place]) 04/01/2008  . Obstructive sleep apnea 04/01/2008  . Essential hypertension, benign 04/01/2008  . LBBB (left bundle branch block) 04/01/2008   Past Medical History:  Diagnosis Date  . Anemia   . Anxiety   . Bipolar disorder ([place])   . Bulging lumbar disc    L3-4  . Chronic daily headache   . Chronic low back pain 09/20/2014  . COPD (chronic obstructive pulmonary disease) ([place])   . Degenerative arthritis   .  Depression   . Diabetes mellitus, type II ([place])   . Diabetic neuropathy ([place]) 09/16/2018  . DM type 2 with diabetic peripheral neuropathy ([place]) 09/20/2014  . Dyslipidemia   . Dyspnea   . Gastroparesis   . GERD (gastroesophageal reflux disease)   .  Heart murmur   . History of hiatal hernia   . Hypothyroidism   . IBS (irritable bowel syndrome)   . Memory difficulty 09/20/2014  . Morbid obesity ([place])   . Neuropathy   . Obstructive sleep apnea on CPAP   . Restless legs syndrome (RLS) 09/17/2012  . Stroke (cerebrum) ([place]) 05/16/2017   Left parietal    Family History  Problem Relation Age of Onset  . Hypertension Mother   . Lymphoma Mother   . Depression Mother   . Arthritis Father   . Alcohol abuse Father   . Hypertension Sister   . Cancer Brother        kidney and lung  . Alcohol abuse Brother   . Alcohol abuse Paternal Uncle   . Alcohol abuse Paternal Grandfather   . Alcohol abuse Paternal Grandmother     Past Surgical History:  Procedure Laterality Date  . ABDOMINAL HYSTERECTOMY    . ARTHROSCOPY KNEE W/ DRILLING  06/2011   and Decemer of 2013.  [name] Carpel tunnel  1980's  . CATARACT EXTRACTION W/PHACO Right 03/21/2017   Procedure: CATARACT EXTRACTION PHACO AND INTRAOCULAR LENS PLACEMENT RIGHT EYE;  Surgeon: Baruch Goldmann, MD;  Location: [facility];  Service: Ophthalmology;  Laterality: Right;  CDE: 2.91   . CATARACT EXTRACTION W/PHACO Left 04/18/2017   Procedure: CATARACT EXTRACTION PHACO AND INTRAOCULAR LENS PLACEMENT LEFT EYE;  Surgeon: Baruch Goldmann, MD;  Location: [facility];  Service: Ophthalmology;  Laterality: Left;  left  . CHOLECYSTECTOMY    . COLONOSCOPY WITH PROPOFOL N/A 10/04/2016   Procedure: COLONOSCOPY WITH PROPOFOL;  Surgeon: Rogene Houston, MD;  Location: [facility];  Service: Endoscopy;  Laterality: N/A;  11:10  . CORONARY ARTERY BYPASS GRAFT N/A 08/29/2017   Procedure: CORONARY ARTERY BYPASS GRAFTING (CABG) x 3 WITH ENDOSCOPIC HARVESTING OF RIGHT SAPHENOUS VEIN;   Surgeon: Ivin Poot, MD;  Location: [facility];  Service: Open Heart Surgery;  Laterality: N/A;  . ESOPHAGOGASTRODUODENOSCOPY N/A 04/25/2016   Procedure: ESOPHAGOGASTRODUODENOSCOPY (EGD);  Surgeon: Rogene Houston, MD;  [facility];  Service: Endoscopy;  Laterality: N/A;  730  . LEFT HEART CATH AND CORONARY ANGIOGRAPHY N/A 08/27/2017   Procedure: LEFT HEART CATH AND CORONARY ANGIOGRAPHY;  Surgeon: Sherren Mocha, MD;  Location: [facility];  Service: Cardiovascular::  pLAD 95% - p-mLAD 50%, ostD1 90% -pD1 80%; ostOM1 90%; rPDA 80%. EF ~50-55% - HK of dital Anterolateral & Apical wall.  - Rec CVTS c/s  . RECTAL SURGERY     fissure  . SHOULDER SURGERY Left    arthroscopy in March of this year  . TEE WITHOUT CARDIOVERSION N/A 08/29/2017   Procedure: TRANSESOPHAGEAL ECHOCARDIOGRAM (TEE);  Surgeon: Prescott Gum, Collier Salina, MD;  Location: [facility];  Service: Open Heart Surgery;  Laterality: N/A;  . TRANSTHORACIC ECHOCARDIOGRAM  06/06/2017   Mild to moderate reduced EF 40 and 45%.  Anterior septal, inferoseptal and basal to mid inferior hypokinesis.  GR 1 DD.  No significant valvular lesion   Social History   Occupational History    Employer: [employer]  Tobacco Use  . Smoking status: Former Smoker    Types: Cigarettes    Quit date: 02/04/1971    Years since quitting: 47.8  . Smokeless tobacco: Never Used  . Tobacco comment: smoked 2 cigarettes a day  Substance and Sexual Activity  . Alcohol use: No    Alcohol/week: 0.0 standard drinks  . Drug use: No  . Sexual activity: Never

## 2018-12-17 NOTE — Telephone Encounter (Signed)
Please precert for right knee visco injections. Patient wants to do these in [place] with Dr.Whitfield.

## 2018-12-18 NOTE — Telephone Encounter (Signed)
Noted  

## 2018-12-22 DIAGNOSIS — Z23 Encounter for immunization: Secondary | ICD-10-CM | POA: Diagnosis not present

## 2018-12-22 DIAGNOSIS — I2510 Atherosclerotic heart disease of native coronary artery without angina pectoris: Secondary | ICD-10-CM | POA: Diagnosis not present

## 2018-12-22 DIAGNOSIS — G4733 Obstructive sleep apnea (adult) (pediatric): Secondary | ICD-10-CM | POA: Diagnosis not present

## 2018-12-22 DIAGNOSIS — E1169 Type 2 diabetes mellitus with other specified complication: Secondary | ICD-10-CM | POA: Diagnosis not present

## 2018-12-22 DIAGNOSIS — J449 Chronic obstructive pulmonary disease, unspecified: Secondary | ICD-10-CM | POA: Diagnosis not present

## 2018-12-22 IMAGING — CR DG CHEST 2V
2 series · 2 of 2 positions shown · non-contrast
Comparison: 09/03/2017

CLINICAL DATA: Shortness of breath

EXAM:
CHEST - 2 VIEW

[w chest pa]
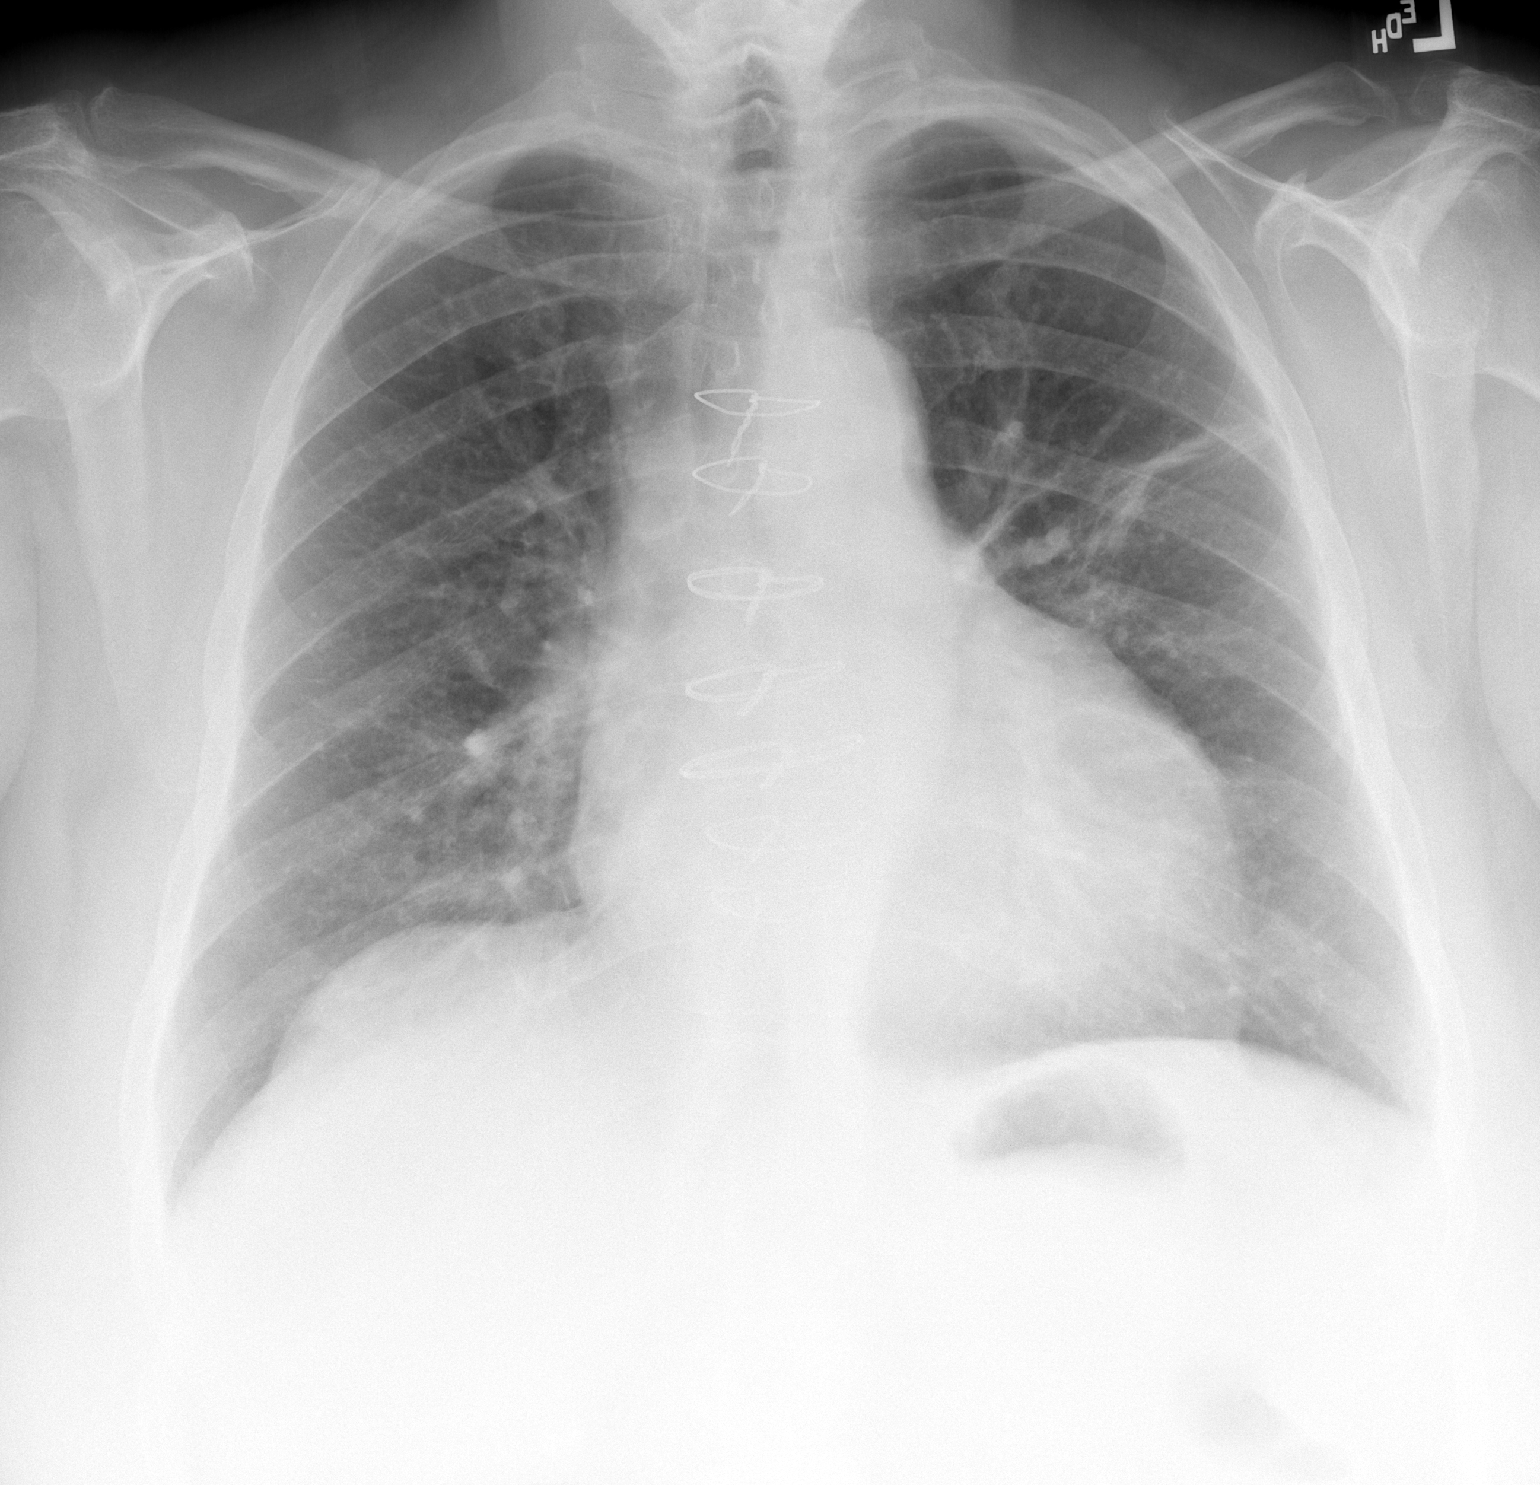

[w chest lat]
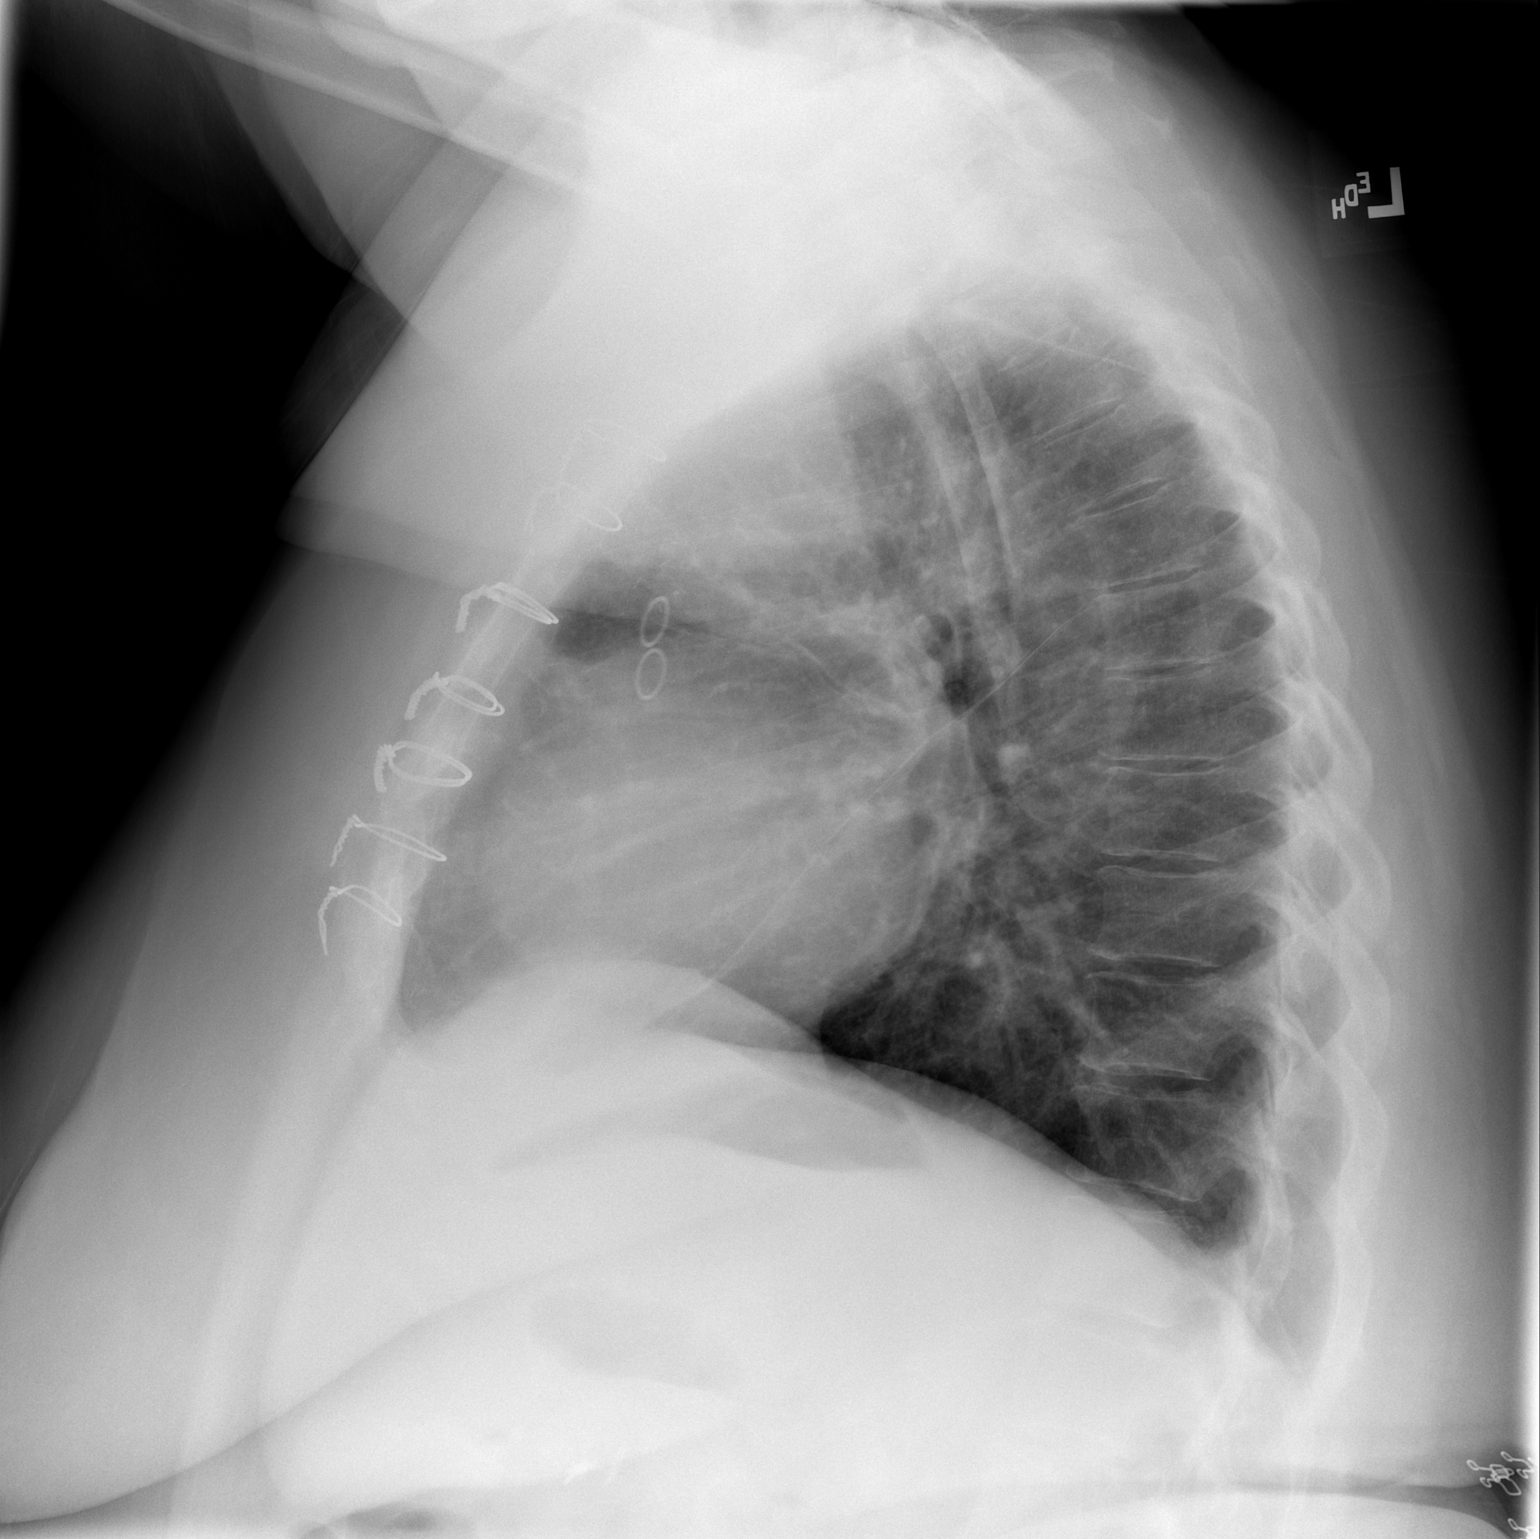

[2 of 2 positions shown; findings below may reference images not displayed]

FINDINGS: Cardiac shadow is enlarged but stable. Postsurgical changes are
again seen. Linear scarring in the left mid lung is noted and
stable. No sizable effusion is seen. No focal infiltrate is noted.
No bony abnormality is seen.
IMPRESSION: Chronic changes in the left mid lung.  No acute abnormality noted.

## 2018-12-25 ENCOUNTER — Telehealth: Payer: Self-pay

## 2018-12-25 DIAGNOSIS — R079 Chest pain, unspecified: Secondary | ICD-10-CM | POA: Diagnosis not present

## 2018-12-25 DIAGNOSIS — E1165 Type 2 diabetes mellitus with hyperglycemia: Secondary | ICD-10-CM | POA: Diagnosis not present

## 2018-12-25 DIAGNOSIS — J449 Chronic obstructive pulmonary disease, unspecified: Secondary | ICD-10-CM | POA: Diagnosis not present

## 2018-12-25 DIAGNOSIS — E785 Hyperlipidemia, unspecified: Secondary | ICD-10-CM | POA: Diagnosis not present

## 2018-12-25 NOTE — Telephone Encounter (Signed)
Submitted VOB for Orthovisc series, right knee. 

## 2018-12-30 ENCOUNTER — Telehealth: Payer: Self-pay

## 2018-12-30 NOTE — Telephone Encounter (Signed)
Please schedule patient an appointment with Dr. Durward Fortes for gel injection.  Thank you.  Approved for Orthovisc series, right knee. [employer] [place]) will pick up remaining eligible expenses at 100%. May have a Co-pay of $20.00 No PA required

## 2018-12-31 NOTE — Telephone Encounter (Signed)
Spoke with patient who stated she will schedule her Orthovisc injections with Dr. Durward Fortes at the [facility] office.

## 2019-01-07 ENCOUNTER — Encounter (HOSPITAL_COMMUNITY): Payer: Self-pay | Admitting: Psychiatry

## 2019-01-07 ENCOUNTER — Ambulatory Visit (INDEPENDENT_AMBULATORY_CARE_PROVIDER_SITE_OTHER): Payer: Medicare Other | Admitting: Psychiatry

## 2019-01-07 ENCOUNTER — Other Ambulatory Visit: Payer: Self-pay

## 2019-01-07 DIAGNOSIS — F331 Major depressive disorder, recurrent, moderate: Secondary | ICD-10-CM

## 2019-01-07 DIAGNOSIS — I2510 Atherosclerotic heart disease of native coronary artery without angina pectoris: Secondary | ICD-10-CM

## 2019-01-07 MED ORDER — VENLAFAXINE HCL ER 150 MG PO CP24: ORAL_CAPSULE | ORAL | 2 refills | Status: DC

## 2019-01-07 MED ORDER — TRAZODONE HCL 100 MG PO TABS: 100.0000 mg | ORAL_TABLET | Freq: Every day | ORAL | 2 refills | Status: DC

## 2019-01-07 MED ORDER — LAMOTRIGINE 150 MG PO TABS: 150.0000 mg | ORAL_TABLET | Freq: Every day | ORAL | 2 refills | Status: DC

## 2019-01-07 MED ORDER — BUPROPION HCL ER (XL) 300 MG PO TB24: 300.0000 mg | ORAL_TABLET | Freq: Every morning | ORAL | 2 refills | Status: DC

## 2019-01-07 NOTE — Progress Notes (Signed)
Virtual Visit via Telephone Note  I connected with Norma [name] on 01/07/19 at 11:00 AM EDT by telephone and verified that I am speaking with the correct person using two identifiers.   I discussed the limitations, risks, security and privacy concerns of performing an evaluation and management service by telephone and the availability of in person appointments. I also discussed with the patient that there may be a patient responsible charge related to this service. The patient expressed understanding and agreed to proceed.      I discussed the assessment and treatment plan with the patient. The patient was provided an opportunity to ask questions and all were answered. The patient agreed with the plan and demonstrated an understanding of the instructions.   The patient was advised to call back or seek an in-person evaluation if the symptoms worsen or if the condition fails to improve as anticipated.  I provided 15 minutes of non-face-to-face time during this encounter.   [name], MD  [facility] MD/PA/NP OP Progress Note  01/07/2019 11:21 AM Norma [name]  MRN:  [id number]  Chief Complaint:  Chief Complaint    Depression; Anxiety; Follow-up     HPI: This patient is a 68 year old single white female who lives alone in [place].  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  She was a patient here but was last seen in 2017.  She states that she quit coming because of financial reasons.  Since then the patient has been followed by her primary doctor.  She has had other health issues arise and needed CABG.  She is having severe problems with knee pain and is trying to lose enough weight to have a knee replacement surgery.  She also has chronic back pain.  Her psychiatric medications were not really changed much since she last saw me.  She states that she still goes to periods of significant depression but most of it sounds situational.  Currently her 6 year old nephew is staying at her  house.  She states that he is rude and had been cursing and berating her.  She finally put her foot down and got them to stop.  She often has conflicts with her parents and even her sister.  All of this gets her depressed.  After she had been hospitalized for cardiac issues she states that her church did not do anything for her encouraged her to come back and this was very disappointing and hurtful.  She has been more isolated lately.  She has been compliant with her medications which include Effexor XR Wellbutrin and Lamictal.  She is trying to do a little bit of light housework.  Her nephew brought dogs into the home and she enjoys the dogs.  She does not currently have any thoughts of self-harm or suicide.  She is not sleeping well partly due to chronic pain and partly due to having to urinate because of her blood pressure pill.  She tried trazodone 50 mg but it is not helping so I suggested we increase it. Visit Diagnosis:    ICD-10-CM   1. Moderate episode of recurrent major depressive disorder ([place])  F33.1     Past Psychiatric History: Hospitalizations around 2001.  She went through a course of ECT around that time and had been tried on Serzone Effexor Wellbutrin and Zyprexa  Past Medical History:  Past Medical History:  Diagnosis Date  . Anemia   . Anxiety   . Bipolar disorder ([place])   . Bulging  lumbar disc    L3-4  . Chronic daily headache   . Chronic low back pain 09/20/2014  . COPD (chronic obstructive pulmonary disease) ([place])   . Degenerative arthritis   . Depression   . Diabetes mellitus, type II ([place])   . Diabetic neuropathy ([place]) 09/16/2018  . DM type 2 with diabetic peripheral neuropathy ([place]) 09/20/2014  . Dyslipidemia   . Dyspnea   . Gastroparesis   . GERD (gastroesophageal reflux disease)   . Heart murmur   . History of hiatal hernia   . Hypothyroidism   . IBS (irritable bowel syndrome)   . Memory difficulty 09/20/2014  . Morbid obesity ([place])   . Neuropathy   .  Obstructive sleep apnea on CPAP   . Restless legs syndrome (RLS) 09/17/2012  . Stroke (cerebrum) ([place]) 05/16/2017   Left parietal    Past Surgical History:  Procedure Laterality Date  . ABDOMINAL HYSTERECTOMY    . ARTHROSCOPY KNEE W/ DRILLING  06/2011   and Decemer of 2013.  [name] Carpel tunnel  1980's  . CATARACT EXTRACTION W/PHACO Right 03/21/2017   Procedure: CATARACT EXTRACTION PHACO AND INTRAOCULAR LENS PLACEMENT RIGHT EYE;  Surgeon: Baruch Goldmann, MD;  Location: [facility];  Service: Ophthalmology;  Laterality: Right;  CDE: 2.91   . CATARACT EXTRACTION W/PHACO Left 04/18/2017   Procedure: CATARACT EXTRACTION PHACO AND INTRAOCULAR LENS PLACEMENT LEFT EYE;  Surgeon: Baruch Goldmann, MD;  Location: [facility];  Service: Ophthalmology;  Laterality: Left;  left  . CHOLECYSTECTOMY    . COLONOSCOPY WITH PROPOFOL N/A 10/04/2016   Procedure: COLONOSCOPY WITH PROPOFOL;  Surgeon: Rogene Houston, MD;  [facility];  Service: Endoscopy;  Laterality: N/A;  11:10  . CORONARY ARTERY BYPASS GRAFT N/A 08/29/2017   Procedure: CORONARY ARTERY BYPASS GRAFTING (CABG) x 3 WITH ENDOSCOPIC HARVESTING OF RIGHT SAPHENOUS VEIN;  Surgeon: Ivin Poot, MD;  Location: [facility];  Service: Open Heart Surgery;  Laterality: N/A;  . ESOPHAGOGASTRODUODENOSCOPY N/A 04/25/2016   Procedure: ESOPHAGOGASTRODUODENOSCOPY (EGD);  Surgeon: Rogene Houston, MD;  [facility];  Service: Endoscopy;  Laterality: N/A;  730  . LEFT HEART CATH AND CORONARY ANGIOGRAPHY N/A 08/27/2017   Procedure: LEFT HEART CATH AND CORONARY ANGIOGRAPHY;  Surgeon: Sherren Mocha, MD;  Location: [facility];  Service: Cardiovascular::  pLAD 95% - p-mLAD 50%, ostD1 90% -pD1 80%; ostOM1 90%; rPDA 80%. EF ~50-55% - HK of dital Anterolateral & Apical wall.  - Rec CVTS c/s  . RECTAL SURGERY     fissure  . SHOULDER SURGERY Left    arthroscopy in March of this year  . TEE WITHOUT CARDIOVERSION N/A 08/29/2017   Procedure: TRANSESOPHAGEAL  ECHOCARDIOGRAM (TEE);  Surgeon: Prescott Gum, Collier Salina, MD;  Location: [facility];  Service: Open Heart Surgery;  Laterality: N/A;  . TRANSTHORACIC ECHOCARDIOGRAM  06/06/2017   Mild to moderate reduced EF 40 and 45%.  Anterior septal, inferoseptal and basal to mid inferior hypokinesis.  GR 1 DD.  No significant valvular lesion    Family Psychiatric History: see below  Family History:  Family History  Problem Relation Age of Onset  . Hypertension Mother   . Lymphoma Mother   . Depression Mother   . Arthritis Father   . Alcohol abuse Father   . Hypertension Sister   . Cancer Brother        kidney and lung  . Alcohol abuse Brother   . Alcohol abuse Paternal Uncle   . Alcohol abuse Paternal Grandfather   .  Alcohol abuse Paternal Grandmother     Social History:  Social History   Socioeconomic History  . Marital status: Single    Spouse name: Not on file  . Number of children: 0  . Years of education: 81  . Highest education level: Not on file  Occupational History    Employer: [employer] Needs  . Financial resource strain: Not on file  . Food insecurity    Worry: Not on file    Inability: Not on file  . Transportation needs    Medical: Not on file    Non-medical: Not on file  Tobacco Use  . Smoking status: Former Smoker    Types: Cigarettes    Quit date: 02/04/1971    Years since quitting: 47.9  . Smokeless tobacco: Never Used  . Tobacco comment: smoked 2 cigarettes a day  Substance and Sexual Activity  . Alcohol use: No    Alcohol/week: 0.0 standard drinks  . Drug use: No  . Sexual activity: Never  Lifestyle  . Physical activity    Days per week: Not on file    Minutes per session: Not on file  . Stress: Not on file  Relationships  . Social Herbalist on phone: Not on file    Gets together: Not on file    Attends religious service: Not on file    Active member of club or organization: Not on file    Attends meetings of clubs or  organizations: Not on file    Relationship status: Not on file  Other Topics Concern  . Not on file  Social History Narrative   Patient lives at home alone.    Patient has no children.    Patient has her masters in nursing.    Patient is single.    Patient drinks about 2 glasses of tea daily.   Patient is right handed.    Allergies:  Allergies  Allergen Reactions  . Amoxicillin Anaphylaxis and Other (See Comments)    Has patient had a PCN reaction causing immediate rash, facial/tongue/throat swelling, SOB or lightheadedness with hypotension: Yes Has patient had a PCN reaction causing severe rash involving mucus membranes or skin necrosis: Yes Has patient had a PCN reaction that required hospitalization: Yes Has patient had a PCN reaction occurring within the last 10 years: Yes If all of the above answers are "NO", then may proceed with Cephalosporin use.   [name] Hydrocodone Anaphylaxis  . Percocet [Oxycodone-Acetaminophen] Other (See Comments)    Causes chest pain /tightness. Pressure pain around her ribs.  . Vancomycin Itching  . Depacon [Valproic Acid] Other (See Comments)    Causes falls     Metabolic Disorder Labs: Lab Results  Component Value Date   HGBA1C 6.4 (H) 10/30/2018   MPG 136.98 10/30/2018   MPG 174 01/29/2018   No results found for: PROLACTIN Lab Results  Component Value Date   CHOL 176 04/24/2018   TRIG 114 04/24/2018   HDL 59 04/24/2018   CHOLHDL 3.4 07/10/2016   VLDL 31 (H) 07/10/2016   LDLCALC 96 04/24/2018   LDLCALC 83 07/10/2016   Lab Results  Component Value Date   TSH 2.671 10/30/2018   TSH 3.05 04/24/2018    Therapeutic Level Labs: No results found for: LITHIUM No results found for: VALPROATE No components found for:  CBMZ  Current Medications: Current Outpatient Medications  Medication Sig Dispense Refill  . albuterol (PROVENTIL HFA;VENTOLIN HFA) 108 (90 Base) MCG/ACT  inhaler Inhale 2 puffs into the lungs every 6 (six) hours as  needed for wheezing or shortness of breath.    [name] aspirin EC 81 MG tablet Take 1 tablet (81 mg total) by mouth daily. 90 tablet 3  . baclofen (LIORESAL) 10 MG tablet TAKE ONE TABLET BY MOUTH AT BEDTIME. 30 tablet 6  . buPROPion (WELLBUTRIN XL) 300 MG 24 hr tablet Take 1 tablet (300 mg total) by mouth every morning. 30 tablet 2  . calcium carbonate (TUMS - DOSED IN MG ELEMENTAL CALCIUM) 500 MG chewable tablet Chew 2 tablets by mouth as needed for indigestion or heartburn.     . carvedilol (COREG) 6.25 MG tablet TAKE ONE TABLET BY MOUTH BID(MORNING ,EVENING) 180 tablet 3  . EPINEPHrine (EPIPEN 2-PAK) 0.3 mg/0.3 mL IJ SOAJ injection Inject 0.3 mg into the muscle once.    [name] esomeprazole (NEXIUM) 40 MG capsule Take 40 mg by mouth at bedtime.   12  . ferrous sulfate 325 (65 FE) MG tablet Take 325 mg by mouth daily with breakfast.    . gabapentin (NEURONTIN) 300 MG capsule Take 1 capsule (300 mg total) by mouth 2 (two) times daily.    . hydrOXYzine (ATARAX/VISTARIL) 10 MG tablet Take 1 tablet (10 mg total) by mouth 3 (three) times daily as needed for itching. 30 tablet 0  . Insulin Glargine, 1 Unit Dial, 300 UNIT/ML SOPN Inject 40 Units into the skin at bedtime.    . Lactase (LACTAID FAST ACT) 9000 units TABS Take by mouth. Patient states that she takes as needed.    . lamoTRIgine (LAMICTAL) 150 MG tablet Take 1 tablet (150 mg total) by mouth at bedtime. 30 tablet 2  . levothyroxine (SYNTHROID, LEVOTHROID) 100 MCG tablet Take 100 mcg by mouth daily before breakfast.     . losartan (COZAAR) 25 MG tablet TAKE ONE TABLET BY MOUTH DAILY (MORNING) 90 tablet 1  . meclizine (ANTIVERT) 25 MG tablet Take 1 tablet (25 mg total) by mouth 3 (three) times daily as needed for dizziness. (Patient taking differently: Take 25 mg by mouth daily as needed for dizziness. ) 30 tablet 0  . metFORMIN (GLUCOPHAGE) 500 MG tablet Take 1 tablet (500 mg total) by mouth 2 (two) times daily with a meal. 180 tablet 0  . ondansetron  (ZOFRAN) 4 MG tablet Take 1 tablet (4 mg total) by mouth 2 (two) times daily as needed for nausea or vomiting. 30 tablet 1  . potassium chloride SA (K-DUR,KLOR-CON) 20 MEQ tablet Take 1 tablet (20 mEq total) by mouth daily. 90 tablet 2  . pravastatin (PRAVACHOL) 40 MG tablet Take 1 tablet (40 mg total) by mouth every evening. (Patient not taking: Reported on 10/20/2018) 90 tablet 1  . rOPINIRole (REQUIP) 2 MG tablet Take 2 mg by mouth 3 (three) times daily. .    . torsemide (DEMADEX) 20 MG tablet Take one tablet (20mg ) alternating days with (40mg ) daily (Patient taking differently: 20 mg daily. Take one tablet (20mg ) daily) 135 tablet 3  . traMADol (ULTRAM) 50 MG tablet Take 100 mg by mouth 4 (four) times daily as needed.     . traZODone (DESYREL) 100 MG tablet Take 1 tablet (100 mg total) by mouth at bedtime. 30 tablet 2  . traZODone (DESYREL) 50 MG tablet Take 50 mg by mouth at bedtime.  5  . venlafaxine XR (EFFEXOR-XR) 150 MG 24 hr capsule TAKE TWO (2) CAPSULES BY MOUTH AT BEDTIME. 60 capsule 2   No current facility-administered  medications for this visit.      Musculoskeletal: Strength & Muscle Tone: decreased Gait & Station: unsteady Patient leans: N/A  Psychiatric Specialty Exam: Review of Systems  Musculoskeletal: Positive for back pain and joint pain.  Psychiatric/Behavioral: Positive for depression. The patient is nervous/anxious and has insomnia.   All other systems reviewed and are negative.   There were no vitals taken for this visit.There is no height or weight on file to calculate BMI.  General Appearance: NA  Eye Contact:  NA  Speech:  Clear and Coherent  Volume:  Normal  Mood:  Anxious and Dysphoric  Affect:  NA  Thought Process:  Goal Directed  Orientation:  Full (Time, Place, and Person)  Thought Content: Rumination   Suicidal Thoughts:  No  Homicidal Thoughts:  No  Memory:  Immediate;   Good Recent;   Good Remote;   Good  Judgement:  Fair  Insight:  Shallow   Psychomotor Activity:  Decreased  Concentration:  Concentration: Good and Attention Span: Good  Recall:  Good  Fund of Knowledge: Good  Language: Good  Akathisia:  No  Handed:  Right  AIMS (if indicated): not done  Assets:  Communication Skills Desire for Improvement Resilience Social Support Talents/Skills  ADL's:  Intact  Cognition: WNL  Sleep:  Poor   Screenings: Mini-Mental     Office Visit from 02/18/2018 in [facility] Office Visit from 09/17/2017 in [facility]  Total Score (max 30 points )  28  28    PHQ2-9     Office Visit from 09/11/2017 in [facility] Office Visit from 04/22/2017 in [facility] Office Visit from 12/06/2016 in [facility] Office Visit from 07/16/2016 in [facility] Nutrition from 04/02/2016 in [facility]  PHQ-2 Total Score  0  0  0  0  4  PHQ-9 Total Score  -  -  -  -  13       Assessment and Plan: This patient is a 68 year old female with a history of depression anxiety mood swings and very dysfunctional family dynamics.  She has had numerous health problems which have worsened including coronary artery disease osteoarthritis.  The chronic pain and mobility issues have not helped.  I suggested that she try to be more involved with other people and keep connections going.  For now we will continue Effexor XR 150 mg twice daily as well as Wellbutrin 300 mg XL daily for depression, Lamictal 150 mg daily for mood swings and increase trazodone to 100 mg at bedtime for sleep.  She also has Xanax 0.25 mg twice daily as needed from Dr. Luan Pulling.  She will restart therapy with Maurice Small here and return to see me in 4 weeks.   [name], MD 01/07/2019, 11:21 AM

## 2019-01-11 ENCOUNTER — Telehealth: Payer: Self-pay

## 2019-01-11 NOTE — Telephone Encounter (Signed)
I contacted the pt and left a vm asking her to call back so we can reschedule her 1 year f/u scheduled for 03/22/2019. Pt can be reschedule with NP. Pt removed from 03/22/2019 schedule.

## 2019-01-12 ENCOUNTER — Ambulatory Visit (INDEPENDENT_AMBULATORY_CARE_PROVIDER_SITE_OTHER): Payer: Self-pay | Admitting: Internal Medicine

## 2019-01-12 DIAGNOSIS — E1142 Type 2 diabetes mellitus with diabetic polyneuropathy: Secondary | ICD-10-CM | POA: Diagnosis not present

## 2019-01-12 DIAGNOSIS — B351 Tinea unguium: Secondary | ICD-10-CM | POA: Diagnosis not present

## 2019-01-12 DIAGNOSIS — L84 Corns and callosities: Secondary | ICD-10-CM | POA: Diagnosis not present

## 2019-01-12 DIAGNOSIS — M79676 Pain in unspecified toe(s): Secondary | ICD-10-CM | POA: Diagnosis not present

## 2019-01-19 ENCOUNTER — Ambulatory Visit (INDEPENDENT_AMBULATORY_CARE_PROVIDER_SITE_OTHER): Payer: Medicare Other | Admitting: Internal Medicine

## 2019-01-20 ENCOUNTER — Encounter: Payer: Self-pay | Admitting: Orthopaedic Surgery

## 2019-01-20 ENCOUNTER — Ambulatory Visit (INDEPENDENT_AMBULATORY_CARE_PROVIDER_SITE_OTHER): Payer: Medicare Other | Admitting: Orthopaedic Surgery

## 2019-01-20 DIAGNOSIS — M1712 Unilateral primary osteoarthritis, left knee: Secondary | ICD-10-CM

## 2019-01-20 DIAGNOSIS — I2510 Atherosclerotic heart disease of native coronary artery without angina pectoris: Secondary | ICD-10-CM

## 2019-01-20 DIAGNOSIS — M170 Bilateral primary osteoarthritis of knee: Secondary | ICD-10-CM

## 2019-01-20 MED ORDER — BUPIVACAINE HCL 0.5 % IJ SOLN
2.0000 mL | INTRAMUSCULAR | Status: AC | PRN
  Administered 2019-01-20: 2 mL via INTRA_ARTICULAR

## 2019-01-20 MED ORDER — LIDOCAINE HCL 1 % IJ SOLN
2.0000 mL | INTRAMUSCULAR | Status: AC | PRN
  Administered 2019-01-20: 2 mL

## 2019-01-20 MED ORDER — METHYLPREDNISOLONE ACETATE 40 MG/ML IJ SUSP
80.0000 mg | INTRAMUSCULAR | Status: AC | PRN
  Administered 2019-01-20: 80 mg via INTRA_ARTICULAR

## 2019-01-20 NOTE — Progress Notes (Signed)
Office Visit Note   Patient: Norma [name]           Date of Birth: [birth year]           MRN: [id number] Visit Date: 01/20/2019              Requested by: Sinda Du, MD [address] [place],  [place] [address] PCP: Sinda Du, MD   Assessment & Plan: Visit Diagnoses:  1. Unilateral primary osteoarthritis, left knee   2. Bilateral primary osteoarthritis of knee     Plan: Norma [name] has been approved for Visco supplementation i.e. Orthovisc for her right knee.  We do not have the product in the office today so we will have her reschedule for 2 weeks.  She is, however, having arthritic pain in her left knee with prior films demonstrating arthritis.  I will inject that with cortisone  Follow-Up Instructions: Return in about 2 weeks (around 02/03/2019).   Orders:  No orders of the defined types were placed in this encounter.  No orders of the defined types were placed in this encounter.     Procedures: Large Joint Inj: L knee on 01/20/2019 1:01 PM Indications: pain and diagnostic evaluation Details: 25 G 1.5 in needle, anterolateral approach  Arthrogram: No  Medications: 2 mL lidocaine 1 %; 2 mL bupivacaine 0.5 %; 80 mg methylPREDNISolone acetate 40 MG/ML Procedure, treatment alternatives, risks and benefits explained, specific risks discussed. Consent was given by the patient. Patient was prepped and draped in the usual sterile fashion.       Clinical Data: No additional findings.   Subjective: No chief complaint on file. Norma [name] has bilateral knee osteoarthritis.  She has been approved for Orthovisc Visco supplementation in her right knee but the product is not available in the office today.  She is also having arthritic pain in her left knee.  I will inject the left knee today with cortisone and have her return in 2 weeks when we obtain the Orthovisc for the right knee  HPI  Review of Systems   Objective: Vital Signs: There were no vitals  taken for this visit.  Physical Exam  Ortho Exam patient has history of multiple "itchy" lesions of both lower extremities and arms.  She is being followed by the dermatologist.  Large knees.  Difficult to determine if there is an effusion.  There is little bit more lateral than medial joint pain on the left knee full extension and flex at least 95 degrees without instability.  Specialty Comments:  No specialty comments available.  Imaging: No results found.   PMFS History: Patient Active Problem List   Diagnosis Date Noted  . Bilateral primary osteoarthritis of knee 01/20/2019  . Diabetic neuropathy ([place]) 09/16/2018  . Unilateral primary osteoarthritis, left knee 09/16/2018  . Class 3 severe obesity due to excess calories with serious comorbidity and body mass index (BMI) of 45.0 to 49.9 in adult ([place]) 02/20/2018  . Mixed hyperlipidemia 09/11/2017  . Pressure injury of skin 09/02/2017  . Coronary artery disease involving native heart without angina pectoris   . S/P CABG x 3   . Leukocytosis   . Acute blood loss anemia   . Post-operative pain   . Bipolar affective disorder ([place])   . Diabetes mellitus type 2 in obese ([place])   . History of CVA (cerebrovascular accident)   . Hypokalemia   . Hx of CABG 08/29/2017  . Coronary artery disease involving native coronary artery of native heart  with unstable angina pectoris ([place]) 08/28/2017  . Unstable angina ([place]) 08/27/2017  . Cardiomyopathy ([place]) 08/22/2017  . Stroke (cerebrum) ([place]) 05/16/2017  . Low hemoglobin 09/19/2016  . Dizziness 06/21/2016  . Chest pain with moderate risk for cardiac etiology 04/23/2016  . Bipolar I disorder, most recent episode depressed ([place]) 06/05/2015  . DM type 2 causing vascular disease ([place]) 09/20/2014  . Chronic bilateral low back pain with bilateral sciatica 09/20/2014  . Morbid obesity, unspecified obesity type ([place]) 09/20/2014  . Memory difficulty 09/20/2014  . Lumbosacral spondylosis without  myelopathy 12/25/2012  . Gastroparesis 11/04/2012  . Nausea with vomiting 11/04/2012  . Restless legs syndrome (RLS) 09/17/2012  . Headache(784.0) 03/19/2012  . GERD (gastroesophageal reflux disease) 02/16/2012  . Abdominal pain 02/16/2012  . Hypothyroidism 02/16/2012  . Tremor 02/16/2012  . Hyperlipidemia associated with type 2 diabetes mellitus ([place]) 04/01/2008  . Obstructive sleep apnea 04/01/2008  . Essential hypertension, benign 04/01/2008  . LBBB (left bundle branch block) 04/01/2008   Past Medical History:  Diagnosis Date  . Anemia   . Anxiety   . Bipolar disorder ([place])   . Bulging lumbar disc    L3-4  . Chronic daily headache   . Chronic low back pain 09/20/2014  . COPD (chronic obstructive pulmonary disease) ([place])   . Degenerative arthritis   . Depression   . Diabetes mellitus, type II ([place])   . Diabetic neuropathy ([place]) 09/16/2018  . DM type 2 with diabetic peripheral neuropathy ([place]) 09/20/2014  . Dyslipidemia   . Dyspnea   . Gastroparesis   . GERD (gastroesophageal reflux disease)   . Heart murmur   . History of hiatal hernia   . Hypothyroidism   . IBS (irritable bowel syndrome)   . Memory difficulty 09/20/2014  . Morbid obesity ([place])   . Neuropathy   . Obstructive sleep apnea on CPAP   . Restless legs syndrome (RLS) 09/17/2012  . Stroke (cerebrum) ([place]) 05/16/2017   Left parietal    Family History  Problem Relation Age of Onset  . Hypertension Mother   . Lymphoma Mother   . Depression Mother   . Arthritis Father   . Alcohol abuse Father   . Hypertension Sister   . Cancer Brother        kidney and lung  . Alcohol abuse Brother   . Alcohol abuse Paternal Uncle   . Alcohol abuse Paternal Grandfather   . Alcohol abuse Paternal Grandmother     Past Surgical History:  Procedure Laterality Date  . ABDOMINAL HYSTERECTOMY    . ARTHROSCOPY KNEE W/ DRILLING  06/2011   and Decemer of 2013.  [name] Carpel tunnel  1980's  . CATARACT EXTRACTION W/PHACO Right 03/21/2017    Procedure: CATARACT EXTRACTION PHACO AND INTRAOCULAR LENS PLACEMENT RIGHT EYE;  Surgeon: Baruch Goldmann, MD;  [facility]: [facility];  Service: Ophthalmology;  Laterality: Right;  CDE: 2.91   . CATARACT EXTRACTION W/PHACO Left 04/18/2017   Procedure: CATARACT EXTRACTION PHACO AND INTRAOCULAR LENS PLACEMENT LEFT EYE;  Surgeon: Baruch Goldmann, MD;  [facility]: [facility];  Service: Ophthalmology;  Laterality: Left;  left  . CHOLECYSTECTOMY    . COLONOSCOPY WITH PROPOFOL N/A 10/04/2016   Procedure: COLONOSCOPY WITH PROPOFOL;  Surgeon: Rogene Houston, MD;  [facility];  Service: Endoscopy;  Laterality: N/A;  11:10  . CORONARY ARTERY BYPASS GRAFT N/A 08/29/2017   Procedure: CORONARY ARTERY BYPASS GRAFTING (CABG) x 3 WITH ENDOSCOPIC HARVESTING OF RIGHT SAPHENOUS VEIN;  Surgeon: Ivin Poot, MD;  [facility];  Service: Open Heart Surgery;  Laterality: N/A;  . ESOPHAGOGASTRODUODENOSCOPY N/A 04/25/2016   Procedure: ESOPHAGOGASTRODUODENOSCOPY (EGD);  Surgeon: Rogene Houston, MD;  [facility];  Service: Endoscopy;  Laterality: N/A;  730  . LEFT HEART CATH AND CORONARY ANGIOGRAPHY N/A 08/27/2017   Procedure: LEFT HEART CATH AND CORONARY ANGIOGRAPHY;  Surgeon: Sherren Mocha, MD;  Location: [facility];  Service: Cardiovascular::  pLAD 95% - p-mLAD 50%, ostD1 90% -pD1 80%; ostOM1 90%; rPDA 80%. EF ~50-55% - HK of dital Anterolateral & Apical wall.  - Rec CVTS c/s  . RECTAL SURGERY     fissure  . SHOULDER SURGERY Left    arthroscopy in March of this year  . TEE WITHOUT CARDIOVERSION N/A 08/29/2017   Procedure: TRANSESOPHAGEAL ECHOCARDIOGRAM (TEE);  Surgeon: Prescott Gum, Collier Salina, MD;  Location: [facility];  Service: Open Heart Surgery;  Laterality: N/A;  . TRANSTHORACIC ECHOCARDIOGRAM  06/06/2017   Mild to moderate reduced EF 40 and 45%.  Anterior septal, inferoseptal and basal to mid inferior hypokinesis.  GR 1 DD.  No significant valvular lesion   Social History   Occupational History     Employer: [employer]  Tobacco Use  . Smoking status: Former Smoker    Types: Cigarettes    Quit date: 02/04/1971    Years since quitting: 47.9  . Smokeless tobacco: Never Used  . Tobacco comment: smoked 2 cigarettes a day  Substance and Sexual Activity  . Alcohol use: No    Alcohol/week: 0.0 standard drinks  . Drug use: No  . Sexual activity: Never     Garald Balding, MD   Note - This record has been created using [employer].  Chart creation errors have been sought, but may not always  have been located. Such creation errors do not reflect on  the standard of medical care.

## 2019-01-21 DIAGNOSIS — I469 Cardiac arrest, cause unspecified: Secondary | ICD-10-CM | POA: Diagnosis not present

## 2019-01-21 DIAGNOSIS — I509 Heart failure, unspecified: Secondary | ICD-10-CM | POA: Diagnosis not present

## 2019-01-21 DIAGNOSIS — E785 Hyperlipidemia, unspecified: Secondary | ICD-10-CM | POA: Diagnosis not present

## 2019-01-21 DIAGNOSIS — Z950 Presence of cardiac pacemaker: Secondary | ICD-10-CM | POA: Diagnosis not present

## 2019-01-27 ENCOUNTER — Other Ambulatory Visit: Payer: Self-pay

## 2019-01-27 ENCOUNTER — Ambulatory Visit: Payer: Medicare Other | Admitting: Orthopaedic Surgery

## 2019-01-27 ENCOUNTER — Ambulatory Visit (HOSPITAL_COMMUNITY): Payer: Medicare Other | Admitting: Psychiatry

## 2019-01-27 ENCOUNTER — Telehealth (HOSPITAL_COMMUNITY): Payer: Self-pay | Admitting: Psychiatry

## 2019-01-27 NOTE — Telephone Encounter (Signed)
Therapist called patient for scheduled appointment, received voice mail recording, left message indicating attempt and requesting patient call office.

## 2019-01-28 ENCOUNTER — Telehealth: Payer: Self-pay | Admitting: "Endocrinology

## 2019-01-28 MED ORDER — INSULIN DEGLUDEC 200 UNIT/ML ~~LOC~~ SOPN: 40.0000 [IU] | PEN_INJECTOR | Freq: Every day | SUBCUTANEOUS | 2 refills | Status: DC

## 2019-01-28 NOTE — Telephone Encounter (Signed)
RX sent. Pt notified

## 2019-01-28 NOTE — Telephone Encounter (Signed)
We can try [place] , same dose.

## 2019-01-28 NOTE — Telephone Encounter (Signed)
Pt states that she has started itching all over every time she takes her Toujeo now. She states this has been going on for the past month. She did say she held the [facility] for a couple of days and the itching went away and since starting it back she the itching has returned.

## 2019-02-03 ENCOUNTER — Other Ambulatory Visit: Payer: Self-pay

## 2019-02-03 ENCOUNTER — Ambulatory Visit: Payer: Medicare Other | Admitting: Orthopaedic Surgery

## 2019-02-05 ENCOUNTER — Other Ambulatory Visit: Payer: Self-pay

## 2019-02-05 ENCOUNTER — Encounter (HOSPITAL_COMMUNITY): Payer: Self-pay | Admitting: Psychiatry

## 2019-02-05 ENCOUNTER — Ambulatory Visit (INDEPENDENT_AMBULATORY_CARE_PROVIDER_SITE_OTHER): Payer: Medicare Other | Admitting: Psychiatry

## 2019-02-05 DIAGNOSIS — F331 Major depressive disorder, recurrent, moderate: Secondary | ICD-10-CM | POA: Diagnosis not present

## 2019-02-05 DIAGNOSIS — I2510 Atherosclerotic heart disease of native coronary artery without angina pectoris: Secondary | ICD-10-CM | POA: Diagnosis not present

## 2019-02-05 MED ORDER — BUPROPION HCL ER (XL) 300 MG PO TB24: 300.0000 mg | ORAL_TABLET | Freq: Every morning | ORAL | 2 refills | Status: DC

## 2019-02-05 MED ORDER — LAMOTRIGINE 150 MG PO TABS: 150.0000 mg | ORAL_TABLET | Freq: Every day | ORAL | 2 refills | Status: DC

## 2019-02-05 MED ORDER — VENLAFAXINE HCL ER 150 MG PO CP24: ORAL_CAPSULE | ORAL | 2 refills | Status: DC

## 2019-02-05 MED ORDER — TEMAZEPAM 15 MG PO CAPS: 15.0000 mg | ORAL_CAPSULE | Freq: Every evening | ORAL | 2 refills | Status: DC | PRN

## 2019-02-05 NOTE — Progress Notes (Signed)
Virtual Visit via Telephone Note  I connected with Norma [name] on 02/05/19 at 11:20 AM EDT by telephone and verified that I am speaking with the correct person using two identifiers.   I discussed the limitations, risks, security and privacy concerns of performing an evaluation and management service by telephone and the availability of in person appointments. I also discussed with the patient that there may be a patient responsible charge related to this service. The patient expressed understanding and agreed to proceed.     I discussed the assessment and treatment plan with the patient. The patient was provided an opportunity to ask questions and all were answered. The patient agreed with the plan and demonstrated an understanding of the instructions.   The patient was advised to call back or seek an in-person evaluation if the symptoms worsen or if the condition fails to improve as anticipated.  I provided 15 minutes of non-face-to-face time during this encounter.   [name], MD  [facility] MD/PA/NP OP Progress Note  02/05/2019 11:41 AM Norma [name]  MRN:  [id number]  Chief Complaint:  Chief Complaint    Depression; Anxiety; Follow-up     HPI: This patient is a 68 year old single white female who lives alone in [place].  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  She was a patient here but was last seen in 2017.  She states that she quit coming because of financial reasons.  Since then the patient has been followed by her primary doctor.  She has had other health issues arise and needed CABG.  She is having severe problems with knee pain and is trying to lose enough weight to have a knee replacement surgery.  She also has chronic back pain.  Her psychiatric medications were not really changed much since she last saw me.  She states that she still goes to periods of significant depression but most of it sounds situational.  Currently her 109 year old nephew is staying at her  house.  She states that he is rude and had been cursing and berating her.  She finally put her foot down and got them to stop.  She often has conflicts with her parents and even her sister.  All of this gets her depressed.  After she had been hospitalized for cardiac issues she states that her church did not do anything for her encouraged her to come back and this was very disappointing and hurtful.  She has been more isolated lately.  She has been compliant with her medications which include Effexor XR Wellbutrin and Lamictal.  She is trying to do a little bit of light housework.  Her nephew brought dogs into the home and she enjoys the dogs.  She does not currently have any thoughts of self-harm or suicide.  She is not sleeping well partly due to chronic pain and partly due to having to urinate because of her blood pressure pill.  She tried trazodone 50 mg but it is not helping so I suggested we increase it.  The patient returns after 4 weeks.  She states she is doing okay but she is not sleeping well.  She is very worried about her finances.  She is now on a fixed Social Security income and no longer working.  She admits that she has a fair amount of credit card debt plus her mortgage and other expenses.  Some months she does not have quite enough left for food.  She states that she often  goes to sleep but wakes up at night.  The trazodone is not helping even at the higher dosage.  She is stable in terms of mood although her energy sounds pretty low.  I suggested we add temazepam at bedtime to help her sleep since the trazodone is not working.  She denies any thoughts of self-harm Visit Diagnosis:    ICD-10-CM   1. Moderate episode of recurrent major depressive disorder ([place])  F33.1     Past Psychiatric History: Hospitalizations around 2001.  She went through a course of ECT around that time and had been tried on Serzone Effexor Wellbutrin and Zyprexa  Past Medical History:  Past Medical History:   Diagnosis Date  . Anemia   . Anxiety   . Bipolar disorder ([place])   . Bulging lumbar disc    L3-4  . Chronic daily headache   . Chronic low back pain 09/20/2014  . COPD (chronic obstructive pulmonary disease) ([place])   . Degenerative arthritis   . Depression   . Diabetes mellitus, type II ([place])   . Diabetic neuropathy ([place]) 09/16/2018  . DM type 2 with diabetic peripheral neuropathy ([place]) 09/20/2014  . Dyslipidemia   . Dyspnea   . Gastroparesis   . GERD (gastroesophageal reflux disease)   . Heart murmur   . History of hiatal hernia   . Hypothyroidism   . IBS (irritable bowel syndrome)   . Memory difficulty 09/20/2014  . Morbid obesity ([place])   . Neuropathy   . Obstructive sleep apnea on CPAP   . Restless legs syndrome (RLS) 09/17/2012  . Stroke (cerebrum) ([place]) 05/16/2017   Left parietal    Past Surgical History:  Procedure Laterality Date  . ABDOMINAL HYSTERECTOMY    . ARTHROSCOPY KNEE W/ DRILLING  06/2011   and Decemer of 2013.  [name] Carpel tunnel  1980's  . CATARACT EXTRACTION W/PHACO Right 03/21/2017   Procedure: CATARACT EXTRACTION PHACO AND INTRAOCULAR LENS PLACEMENT RIGHT EYE;  Surgeon: Baruch Goldmann, MD;  Location: [facility];  Service: Ophthalmology;  Laterality: Right;  CDE: 2.91   . CATARACT EXTRACTION W/PHACO Left 04/18/2017   Procedure: CATARACT EXTRACTION PHACO AND INTRAOCULAR LENS PLACEMENT LEFT EYE;  Surgeon: Baruch Goldmann, MD;  Location: [facility];  Service: Ophthalmology;  Laterality: Left;  left  . CHOLECYSTECTOMY    . COLONOSCOPY WITH PROPOFOL N/A 10/04/2016   Procedure: COLONOSCOPY WITH PROPOFOL;  Surgeon: Rogene Houston, MD;  Location: [facility];  Service: Endoscopy;  Laterality: N/A;  11:10  . CORONARY ARTERY BYPASS GRAFT N/A 08/29/2017   Procedure: CORONARY ARTERY BYPASS GRAFTING (CABG) x 3 WITH ENDOSCOPIC HARVESTING OF RIGHT SAPHENOUS VEIN;  Surgeon: Ivin Poot, MD;  Location: [place];  Service: Open Heart Surgery;  Laterality: N/A;  . ESOPHAGOGASTRODUODENOSCOPY  N/A 04/25/2016   Procedure: ESOPHAGOGASTRODUODENOSCOPY (EGD);  Surgeon: Rogene Houston, MD;  [facility];  Service: Endoscopy;  Laterality: N/A;  730  . LEFT HEART CATH AND CORONARY ANGIOGRAPHY N/A 08/27/2017   Procedure: LEFT HEART CATH AND CORONARY ANGIOGRAPHY;  Surgeon: Sherren Mocha, MD;  Location: [facility];  Service: Cardiovascular::  pLAD 95% - p-mLAD 50%, ostD1 90% -pD1 80%; ostOM1 90%; rPDA 80%. EF ~50-55% - HK of dital Anterolateral & Apical wall.  - Rec CVTS c/s  . RECTAL SURGERY     fissure  . SHOULDER SURGERY Left    arthroscopy in March of this year  . TEE WITHOUT CARDIOVERSION N/A 08/29/2017   Procedure: TRANSESOPHAGEAL ECHOCARDIOGRAM (TEE);  Surgeon: Prescott Gum, Collier Salina, MD;  Location: [facility];  Service: Open Heart Surgery;  Laterality: N/A;  . TRANSTHORACIC ECHOCARDIOGRAM  06/06/2017   Mild to moderate reduced EF 40 and 45%.  Anterior septal, inferoseptal and basal to mid inferior hypokinesis.  GR 1 DD.  No significant valvular lesion    Family Psychiatric History: see below  Family History:  Family History  Problem Relation Age of Onset  . Hypertension Mother   . Lymphoma Mother   . Depression Mother   . Arthritis Father   . Alcohol abuse Father   . Hypertension Sister   . Cancer Brother        kidney and lung  . Alcohol abuse Brother   . Alcohol abuse Paternal Uncle   . Alcohol abuse Paternal Grandfather   . Alcohol abuse Paternal Grandmother     Social History:  Social History   Socioeconomic History  . Marital status: Single    Spouse name: Not on file  . Number of children: 0  . Years of education: 77  . Highest education level: Not on file  Occupational History    Employer: [employer]  . Financial resource strain: Not on file  . Food insecurity    Worry: Not on file    Inability: Not on file  . Transportation needs    Medical: Not on file    Non-medical: Not on file  Tobacco Use  . Smoking status:  Former Smoker    Types: Cigarettes    Quit date: 02/04/1971    Years since quitting: 48.0  . Smokeless tobacco: Never Used  . Tobacco comment: smoked 2 cigarettes a day  Substance and Sexual Activity  . Alcohol use: No    Alcohol/week: 0.0 standard drinks  . Drug use: No  . Sexual activity: Never  Lifestyle  . Physical activity    Days per week: Not on file    Minutes per session: Not on file  . Stress: Not on file  Relationships  . Social Herbalist on phone: Not on file    Gets together: Not on file    Attends religious service: Not on file    Active member of club or organization: Not on file    Attends meetings of clubs or organizations: Not on file    Relationship status: Not on file  Other Topics Concern  . Not on file  Social History Narrative   Patient lives at home alone.    Patient has no children.    Patient has her masters in nursing.    Patient is single.    Patient drinks about 2 glasses of tea daily.   Patient is right handed.    Allergies:  Allergies  Allergen Reactions  . Amoxicillin Anaphylaxis and Other (See Comments)    Has patient had a PCN reaction causing immediate rash, facial/tongue/throat swelling, SOB or lightheadedness with hypotension: Yes Has patient had a PCN reaction causing severe rash involving mucus membranes or skin necrosis: Yes Has patient had a PCN reaction that required hospitalization: Yes Has patient had a PCN reaction occurring within the last 10 years: Yes If all of the above answers are "NO", then may proceed with Cephalosporin use.   [name] Hydrocodone Anaphylaxis  . Percocet [Oxycodone-Acetaminophen] Other (See Comments)    Causes chest pain /tightness. Pressure pain around her ribs.  . Vancomycin Itching  . Depacon [Valproic Acid] Other (See Comments)    Causes falls  Metabolic Disorder Labs: Lab Results  Component Value Date   HGBA1C 6.4 (H) 10/30/2018   MPG 136.98 10/30/2018   MPG 174 01/29/2018   No  results found for: PROLACTIN Lab Results  Component Value Date   CHOL 176 04/24/2018   TRIG 114 04/24/2018   HDL 59 04/24/2018   CHOLHDL 3.4 07/10/2016   VLDL 31 (H) 07/10/2016   LDLCALC 96 04/24/2018   LDLCALC 83 07/10/2016   Lab Results  Component Value Date   TSH 2.671 10/30/2018   TSH 3.05 04/24/2018    Therapeutic Level Labs: No results found for: LITHIUM No results found for: VALPROATE No components found for:  CBMZ  Current Medications: Current Outpatient Medications  Medication Sig Dispense Refill  . albuterol (PROVENTIL HFA;VENTOLIN HFA) 108 (90 Base) MCG/ACT inhaler Inhale 2 puffs into the lungs every 6 (six) hours as needed for wheezing or shortness of breath.    [name] aspirin EC 81 MG tablet Take 1 tablet (81 mg total) by mouth daily. 90 tablet 3  . baclofen (LIORESAL) 10 MG tablet TAKE ONE TABLET BY MOUTH AT BEDTIME. 30 tablet 6  . buPROPion (WELLBUTRIN XL) 300 MG 24 hr tablet Take 1 tablet (300 mg total) by mouth every morning. 30 tablet 2  . calcium carbonate (TUMS - DOSED IN MG ELEMENTAL CALCIUM) 500 MG chewable tablet Chew 2 tablets by mouth as needed for indigestion or heartburn.     . carvedilol (COREG) 6.25 MG tablet TAKE ONE TABLET BY MOUTH BID(MORNING ,EVENING) 180 tablet 3  . EPINEPHrine (EPIPEN 2-PAK) 0.3 mg/0.3 mL IJ SOAJ injection Inject 0.3 mg into the muscle once.    [name] esomeprazole (NEXIUM) 40 MG capsule Take 40 mg by mouth at bedtime.   12  . ferrous sulfate 325 (65 FE) MG tablet Take 325 mg by mouth daily with breakfast.    . gabapentin (NEURONTIN) 300 MG capsule Take 1 capsule (300 mg total) by mouth 2 (two) times daily.    . hydrOXYzine (ATARAX/VISTARIL) 10 MG tablet Take 1 tablet (10 mg total) by mouth 3 (three) times daily as needed for itching. 30 tablet 0  . Insulin Degludec 200 UNIT/ML SOPN Inject 40 Units into the skin at bedtime. 4.5 mL 2  . Insulin Glargine, 1 Unit Dial, 300 UNIT/ML SOPN Inject 40 Units into the skin at bedtime.    . Lactase  (LACTAID FAST ACT) 9000 units TABS Take by mouth. Patient states that she takes as needed.    . lamoTRIgine (LAMICTAL) 150 MG tablet Take 1 tablet (150 mg total) by mouth at bedtime. 30 tablet 2  . levothyroxine (SYNTHROID, LEVOTHROID) 100 MCG tablet Take 100 mcg by mouth daily before breakfast.     . losartan (COZAAR) 25 MG tablet TAKE ONE TABLET BY MOUTH DAILY (MORNING) 90 tablet 1  . meclizine (ANTIVERT) 25 MG tablet Take 1 tablet (25 mg total) by mouth 3 (three) times daily as needed for dizziness. (Patient taking differently: Take 25 mg by mouth daily as needed for dizziness. ) 30 tablet 0  . metFORMIN (GLUCOPHAGE) 500 MG tablet Take 1 tablet (500 mg total) by mouth 2 (two) times daily with a meal. 180 tablet 0  . ondansetron (ZOFRAN) 4 MG tablet Take 1 tablet (4 mg total) by mouth 2 (two) times daily as needed for nausea or vomiting. 30 tablet 1  . potassium chloride SA (K-DUR,KLOR-CON) 20 MEQ tablet Take 1 tablet (20 mEq total) by mouth daily. 90 tablet 2  . pravastatin (PRAVACHOL) 40  MG tablet Take 1 tablet (40 mg total) by mouth every evening. (Patient not taking: Reported on 10/20/2018) 90 tablet 1  . rOPINIRole (REQUIP) 2 MG tablet Take 2 mg by mouth 3 (three) times daily. .    . temazepam (RESTORIL) 15 MG capsule Take 1 capsule (15 mg total) by mouth at bedtime as needed for sleep. 30 capsule 2  . torsemide (DEMADEX) 20 MG tablet Take one tablet (20mg ) alternating days with (40mg ) daily (Patient taking differently: 20 mg daily. Take one tablet (20mg ) daily) 135 tablet 3  . traMADol (ULTRAM) 50 MG tablet Take 100 mg by mouth 4 (four) times daily as needed.     . venlafaxine XR (EFFEXOR-XR) 150 MG 24 hr capsule TAKE TWO (2) CAPSULES BY MOUTH AT BEDTIME. 60 capsule 2   No current facility-administered medications for this visit.      Musculoskeletal: Strength & Muscle Tone: decreased Gait & Station: normal Patient leans: N/A  Psychiatric Specialty Exam: Review of Systems   Musculoskeletal: Positive for back pain and joint pain.  Psychiatric/Behavioral: Positive for depression. The patient has insomnia.   All other systems reviewed and are negative.   There were no vitals taken for this visit.There is no height or weight on file to calculate BMI.  General Appearance: NA  Eye Contact:  NA  Speech:  Clear and Coherent  Volume:  Decreased  Mood:  Anxious and Dysphoric  Affect:  NA  Thought Process:  Goal Directed  Orientation:  Full (Time, Place, and Person)  Thought Content: Rumination   Suicidal Thoughts:  No  Homicidal Thoughts:  No  Memory:  Immediate;   Good Recent;   Good Remote;   Good  Judgement:  Good  Insight:  Good  Psychomotor Activity:  Decreased  Concentration:  Concentration: Good and Attention Span: Good  Recall:  Good  Fund of Knowledge: Good  Language: Good  Akathisia:  No  Handed:  Right  AIMS (if indicated): not done  Assets:  Communication Skills Desire for Improvement Resilience Social Support Talents/Skills  ADL's:  Intact  Cognition: WNL  Sleep:  Poor   Screenings: Mini-Mental     Office Visit from 02/18/2018 in [facility] Office Visit from 09/17/2017 in [facility]  Total Score (max 30 points )  28  28    PHQ2-9     Office Visit from 09/11/2017 in [facility] Office Visit from 04/22/2017 in [facility] Office Visit from 12/06/2016 in [facility] Office Visit from 07/16/2016 in [facility] Nutrition from 04/02/2016 in [facility]  PHQ-2 Total Score  0  0  0  0  4  PHQ-9 Total Score  -  -  -  -  13       Assessment and Plan: This patient is a 68 year old female with a history of depression anxiety and mood swings.  She is currently very stressed regarding her finances.  The trazodone is not helping.  We will discontinue trazodone and start temazepam  15 mg at bedtime.  She was supposed to start therapy with Maurice Small and missed her first appointment but has not rescheduled.  She will continue Effexor XR 150 mg twice daily, Wellbutrin 300 mg XL daily for depression and Lamictal 150 mg daily for mood swings.  She will return to see me in 4 weeks   [name], MD 02/05/2019, 11:41 AM

## 2019-02-10 ENCOUNTER — Ambulatory Visit (INDEPENDENT_AMBULATORY_CARE_PROVIDER_SITE_OTHER): Payer: Medicare Other | Admitting: Orthopaedic Surgery

## 2019-02-10 ENCOUNTER — Encounter: Payer: Self-pay | Admitting: Orthopaedic Surgery

## 2019-02-10 ENCOUNTER — Other Ambulatory Visit: Payer: Self-pay

## 2019-02-10 VITALS — BP 102/54 | HR 89 | Ht 62.0 in | Wt 237.0 lb

## 2019-02-10 DIAGNOSIS — G8929 Other chronic pain: Secondary | ICD-10-CM

## 2019-02-10 DIAGNOSIS — M1711 Unilateral primary osteoarthritis, right knee: Secondary | ICD-10-CM

## 2019-02-10 DIAGNOSIS — M25561 Pain in right knee: Secondary | ICD-10-CM

## 2019-02-10 NOTE — Progress Notes (Signed)
OrthoVisc Lot No. [id number], Exp 2020-11-12.

## 2019-02-10 NOTE — Progress Notes (Signed)
Patient of Dr. Durward Fortes.  This patient is diagnosed with osteoarthritis of the knee(s).    Radiographs show evidence of joint space narrowing, osteophytes, subchondral sclerosis and/or subchondral cysts.  This patient has knee pain which interferes with functional and activities of daily living.    This patient has experienced inadequate response, adverse effects and/or intolerance with conservative treatments such as acetaminophen, NSAIDS, topical creams, physical therapy or regular exercise, knee bracing and/or weight loss.   This patient has experienced inadequate response or has a contraindication to intra articular steroid injections for at least 3 months.   This patient is not scheduled to have a total knee replacement within 6 months of starting treatment with viscosupplementation.  PROCEDURE NOTE:  Orthovisc injection #  1 of 3   Injection 1 vial of Orthovisc into right  knee  BP (!) 102/54   Pulse 89   Ht 5\' 2"  (1.575 m)   Wt 237 lb (107.5 kg)   BMI 43.35 kg/m   The right knee exam: there was no synovitis or infection   The knee was prepped sterilely  Ethyl chloride was used to anesthetize the skin A 20 g needle was used to inject the knee with 1 vial of Orthovisc A sterile dressing was placed  There were no complications  Encounter Diagnosis  Name Primary?  . Chronic pain of right knee Yes    Follow up one week  Electronically Signed Sanjuana Kava, MD 10/28/202010:57 AM

## 2019-02-12 ENCOUNTER — Ambulatory Visit (INDEPENDENT_AMBULATORY_CARE_PROVIDER_SITE_OTHER): Payer: Medicare Other | Admitting: Cardiology

## 2019-02-12 ENCOUNTER — Encounter: Payer: Self-pay | Admitting: Cardiology

## 2019-02-12 ENCOUNTER — Other Ambulatory Visit: Payer: Self-pay

## 2019-02-12 VITALS — BP 127/69 | HR 81 | Ht 62.0 in | Wt 238.4 lb

## 2019-02-12 DIAGNOSIS — I5032 Chronic diastolic (congestive) heart failure: Secondary | ICD-10-CM | POA: Diagnosis not present

## 2019-02-12 DIAGNOSIS — E782 Mixed hyperlipidemia: Secondary | ICD-10-CM | POA: Diagnosis not present

## 2019-02-12 DIAGNOSIS — I2510 Atherosclerotic heart disease of native coronary artery without angina pectoris: Secondary | ICD-10-CM | POA: Diagnosis not present

## 2019-02-12 DIAGNOSIS — I10 Essential (primary) hypertension: Secondary | ICD-10-CM

## 2019-02-12 MED ORDER — POTASSIUM CHLORIDE CRYS ER 20 MEQ PO TBCR: 20.0000 meq | EXTENDED_RELEASE_TABLET | Freq: Every day | ORAL | 2 refills | Status: DC | PRN

## 2019-02-12 MED ORDER — TORSEMIDE 20 MG PO TABS: 20.0000 mg | ORAL_TABLET | Freq: Every day | ORAL | 2 refills | Status: DC | PRN

## 2019-02-12 NOTE — Progress Notes (Signed)
Clinical Summary Ms. Mongold is a 68 y.o.female seen today for follow up of the following medical problems.   1. Chronic diastolic HF [id number] echo VLEF [id number], grade I diastolic dysfunction.   - some SOB at times. Takes torsemide 20mg  prn, tends to take only when she has gained 8 or more lbs. She is trying to limit how often she takes it due to prior Cr elevation.  - normal weight around 229 lbs, can bump up to 238 lbs when retaining fluid.    2. CAD -05/2017 echo LVEF 40-45% - 08/2017 cath for unstable angina: LAD 95%, D1 90%, RPDA 80%, OM 90%.  - CT surgery was consulted. 08/30/17 CABG with sequential SVG to diag and LAD, SVG-OM. -  stopped lopressor due to fatigue.  - no recent ches tpain.   3. Chronic LBBB   4. Hyperlipidemia - off crestor currently due to muscle aches, stoppedshein August. Symptoms mildly improved. - tolerating prasvastatin without issues - Jan 2020 TC 176 HDL 59 TG 114 LDL 96  - leg pains on pravastatin 40mg , we lowered back to 20mg  in 10/2018  - off pravastin symptoms have improved. Side effects on crestor, lipitor also in the past  Past Medical History:  Diagnosis Date  . Anemia   . Anxiety   . Bipolar disorder ([place])   . Bulging lumbar disc    L3-4  . Chronic daily headache   . Chronic low back pain 09/20/2014  . COPD (chronic obstructive pulmonary disease) ([place])   . Degenerative arthritis   . Depression   . Diabetes mellitus, type II ([place])   . Diabetic neuropathy ([place]) 09/16/2018  . DM type 2 with diabetic peripheral neuropathy ([place]) 09/20/2014  . Dyslipidemia   . Dyspnea   . Gastroparesis   . GERD (gastroesophageal reflux disease)   . Heart murmur   . History of hiatal hernia   . Hypothyroidism   . IBS (irritable bowel syndrome)   . Memory difficulty 09/20/2014  . Morbid obesity ([place])   . Neuropathy   . Obstructive sleep apnea on CPAP   . Restless legs syndrome (RLS) 09/17/2012  . Stroke (cerebrum) ([place]) 05/16/2017   Left  parietal     Allergies  Allergen Reactions  . Amoxicillin Anaphylaxis and Other (See Comments)    Has patient had a PCN reaction causing immediate rash, facial/tongue/throat swelling, SOB or lightheadedness with hypotension: Yes Has patient had a PCN reaction causing severe rash involving mucus membranes or skin necrosis: Yes Has patient had a PCN reaction that required hospitalization: Yes Has patient had a PCN reaction occurring within the last 10 years: Yes If all of the above answers are "NO", then may proceed with Cephalosporin use.   [name] Hydrocodone Anaphylaxis  . Percocet [Oxycodone-Acetaminophen] Other (See Comments)    Causes chest pain /tightness. Pressure pain around her ribs.  . Vancomycin Itching  . Depacon [Valproic Acid] Other (See Comments)    Causes falls      Current Outpatient Medications  Medication Sig Dispense Refill  . albuterol (PROVENTIL HFA;VENTOLIN HFA) 108 (90 Base) MCG/ACT inhaler Inhale 2 puffs into the lungs every 6 (six) hours as needed for wheezing or shortness of breath.    [name] aspirin EC 81 MG tablet Take 1 tablet (81 mg total) by mouth daily. 90 tablet 3  . baclofen (LIORESAL) 10 MG tablet TAKE ONE TABLET BY MOUTH AT BEDTIME. 30 tablet 6  . buPROPion (WELLBUTRIN XL) 300 MG 24 hr tablet Take 1  tablet (300 mg total) by mouth every morning. 30 tablet 2  . calcium carbonate (TUMS - DOSED IN MG ELEMENTAL CALCIUM) 500 MG chewable tablet Chew 2 tablets by mouth as needed for indigestion or heartburn.     . carvedilol (COREG) 6.25 MG tablet TAKE ONE TABLET BY MOUTH BID(MORNING ,EVENING) 180 tablet 3  . EPINEPHrine (EPIPEN 2-PAK) 0.3 mg/0.3 mL IJ SOAJ injection Inject 0.3 mg into the muscle once.    [name] esomeprazole (NEXIUM) 40 MG capsule Take 40 mg by mouth at bedtime.   12  . ferrous sulfate 325 (65 FE) MG tablet Take 325 mg by mouth daily with breakfast.    . gabapentin (NEURONTIN) 300 MG capsule Take 1 capsule (300 mg total) by mouth 2 (two) times daily.     . hydrOXYzine (ATARAX/VISTARIL) 10 MG tablet Take 1 tablet (10 mg total) by mouth 3 (three) times daily as needed for itching. 30 tablet 0  . Insulin Degludec 200 UNIT/ML SOPN Inject 40 Units into the skin at bedtime. 4.5 mL 2  . Insulin Glargine, 1 Unit Dial, 300 UNIT/ML SOPN Inject 40 Units into the skin at bedtime.    . Lactase (LACTAID FAST ACT) 9000 units TABS Take by mouth. Patient states that she takes as needed.    . lamoTRIgine (LAMICTAL) 150 MG tablet Take 1 tablet (150 mg total) by mouth at bedtime. 30 tablet 2  . levothyroxine (SYNTHROID, LEVOTHROID) 100 MCG tablet Take 100 mcg by mouth daily before breakfast.     . losartan (COZAAR) 25 MG tablet TAKE ONE TABLET BY MOUTH DAILY (MORNING) 90 tablet 1  . meclizine (ANTIVERT) 25 MG tablet Take 1 tablet (25 mg total) by mouth 3 (three) times daily as needed for dizziness. (Patient taking differently: Take 25 mg by mouth daily as needed for dizziness. ) 30 tablet 0  . metFORMIN (GLUCOPHAGE) 500 MG tablet Take 1 tablet (500 mg total) by mouth 2 (two) times daily with a meal. 180 tablet 0  . ondansetron (ZOFRAN) 4 MG tablet Take 1 tablet (4 mg total) by mouth 2 (two) times daily as needed for nausea or vomiting. 30 tablet 1  . potassium chloride SA (K-DUR,KLOR-CON) 20 MEQ tablet Take 1 tablet (20 mEq total) by mouth daily. 90 tablet 2  . pravastatin (PRAVACHOL) 40 MG tablet Take 1 tablet (40 mg total) by mouth every evening. (Patient not taking: Reported on 10/20/2018) 90 tablet 1  . rOPINIRole (REQUIP) 2 MG tablet Take 2 mg by mouth 3 (three) times daily. .    . temazepam (RESTORIL) 15 MG capsule Take 1 capsule (15 mg total) by mouth at bedtime as needed for sleep. 30 capsule 2  . torsemide (DEMADEX) 20 MG tablet Take one tablet (20mg ) alternating days with (40mg ) daily (Patient taking differently: 20 mg daily. Take one tablet (20mg ) daily) 135 tablet 3  . traMADol (ULTRAM) 50 MG tablet Take 100 mg by mouth 4 (four) times daily as needed.     .  venlafaxine XR (EFFEXOR-XR) 150 MG 24 hr capsule TAKE TWO (2) CAPSULES BY MOUTH AT BEDTIME. 60 capsule 2   No current facility-administered medications for this visit.      Past Surgical History:  Procedure Laterality Date  . ABDOMINAL HYSTERECTOMY    . ARTHROSCOPY KNEE W/ DRILLING  06/2011   and Decemer of 2013.  [name] Carpel tunnel  1980's  . CATARACT EXTRACTION W/PHACO Right 03/21/2017   Procedure: CATARACT EXTRACTION PHACO AND INTRAOCULAR LENS PLACEMENT RIGHT EYE;  Surgeon:  Baruch Goldmann, MD;  Location: [facility];  Service: Ophthalmology;  Laterality: Right;  CDE: 2.91   . CATARACT EXTRACTION W/PHACO Left 04/18/2017   Procedure: CATARACT EXTRACTION PHACO AND INTRAOCULAR LENS PLACEMENT LEFT EYE;  Surgeon: Baruch Goldmann, MD;  Location: [facility];  Service: Ophthalmology;  Laterality: Left;  left  . CHOLECYSTECTOMY    . COLONOSCOPY WITH PROPOFOL N/A 10/04/2016   Procedure: COLONOSCOPY WITH PROPOFOL;  Surgeon: Rogene Houston, MD;  Location: [facility];  Service: Endoscopy;  Laterality: N/A;  11:10  . CORONARY ARTERY BYPASS GRAFT N/A 08/29/2017   Procedure: CORONARY ARTERY BYPASS GRAFTING (CABG) x 3 WITH ENDOSCOPIC HARVESTING OF RIGHT SAPHENOUS VEIN;  Surgeon: Ivin Poot, MD;  Location: [facility];  Service: Open Heart Surgery;  Laterality: N/A;  . ESOPHAGOGASTRODUODENOSCOPY N/A 04/25/2016   Procedure: ESOPHAGOGASTRODUODENOSCOPY (EGD);  Surgeon: Rogene Houston, MD;  [facility]: [facility];  Service: Endoscopy;  Laterality: N/A;  730  . LEFT HEART CATH AND CORONARY ANGIOGRAPHY N/A 08/27/2017   Procedure: LEFT HEART CATH AND CORONARY ANGIOGRAPHY;  Surgeon: Sherren Mocha, MD;  Location: [facility];  Service: Cardiovascular::  pLAD 95% - p-mLAD 50%, ostD1 90% -pD1 80%; ostOM1 90%; rPDA 80%. EF ~50-55% - HK of dital Anterolateral & Apical wall.  - Rec CVTS c/s  . RECTAL SURGERY     fissure  . SHOULDER SURGERY Left    arthroscopy in March of this year  . TEE WITHOUT CARDIOVERSION N/A  08/29/2017   Procedure: TRANSESOPHAGEAL ECHOCARDIOGRAM (TEE);  Surgeon: Prescott Gum, Collier Salina, MD;  Location: [facility];  Service: Open Heart Surgery;  Laterality: N/A;  . TRANSTHORACIC ECHOCARDIOGRAM  06/06/2017   Mild to moderate reduced EF 40 and 45%.  Anterior septal, inferoseptal and basal to mid inferior hypokinesis.  GR 1 DD.  No significant valvular lesion     Allergies  Allergen Reactions  . Amoxicillin Anaphylaxis and Other (See Comments)    Has patient had a PCN reaction causing immediate rash, facial/tongue/throat swelling, SOB or lightheadedness with hypotension: Yes Has patient had a PCN reaction causing severe rash involving mucus membranes or skin necrosis: Yes Has patient had a PCN reaction that required hospitalization: Yes Has patient had a PCN reaction occurring within the last 10 years: Yes If all of the above answers are "NO", then may proceed with Cephalosporin use.   [name] Hydrocodone Anaphylaxis  . Percocet [Oxycodone-Acetaminophen] Other (See Comments)    Causes chest pain /tightness. Pressure pain around her ribs.  . Vancomycin Itching  . Depacon [Valproic Acid] Other (See Comments)    Causes falls       Family History  Problem Relation Age of Onset  . Hypertension Mother   . Lymphoma Mother   . Depression Mother   . Arthritis Father   . Alcohol abuse Father   . Hypertension Sister   . Cancer Brother        kidney and lung  . Alcohol abuse Brother   . Alcohol abuse Paternal Uncle   . Alcohol abuse Paternal Grandfather   . Alcohol abuse Paternal Grandmother      Social History Ms. Eiring reports that she quit smoking about 48 years ago. Her smoking use included cigarettes. She has never used smokeless tobacco. Ms. Kaufhold reports no history of alcohol use.   Review of Systems CONSTITUTIONAL: No weight loss, fever, chills, weakness or fatigue.  HEENT: Eyes: No visual loss, blurred vision, double vision or yellow sclerae.No hearing loss, sneezing, congestion,  runny nose  or sore throat.  SKIN: No rash or itching.  CARDIOVASCULAR: per hpi RESPIRATORY: No shortness of breath, cough or sputum.  GASTROINTESTINAL: No anorexia, nausea, vomiting or diarrhea. No abdominal pain or blood.  GENITOURINARY: No burning on urination, no polyuria NEUROLOGICAL: No headache, dizziness, syncope, paralysis, ataxia, numbness or tingling in the extremities. No change in bowel or bladder control.  MUSCULOSKELETAL: No muscle, back pain, joint pain or stiffness.  LYMPHATICS: No enlarged nodes. No history of splenectomy.  PSYCHIATRIC: No history of depression or anxiety.  ENDOCRINOLOGIC: No reports of sweating, cold or heat intolerance. No polyuria or polydipsia.  [name]   Physical Examination Today's Vitals   02/12/19 1411  BP: 127/69  Pulse: 81  SpO2: 98%  Weight: 238 lb 6.4 oz (108.1 kg)  Height: 5\' 2"  (1.575 m)   Body mass index is 43.6 kg/m.  Gen: resting comfortably, no acute distress HEENT: no scleral icterus, pupils equal round and reactive, no palptable cervical adenopathy,  CV: RRR, 2/6 systolic murmur rusb, no jvd Resp: Clear to auscultation bilaterally GI: abdomen is soft, non-tender, non-distended, normal bowel sounds, no hepatosplenomegaly MSK: extremities are warm, no edema.  Skin: warm, no rash Neuro:  no focal deficits Psych: appropriate affect   Diagnostic Studies 03/2016 Lexiscan  Probable normal perfusion and mild soft tissue attenuation (breast) No significant ischemia or evidence for scar  The left ventricular ejection fraction is normal (55-65%).  Low risk scan.  08/2017 cath  RPDA lesion is 80% stenosed.  Ost 1st Mrg to 1st Mrg lesion is 90% stenosed.  Prox LAD lesion is 95% stenosed.  Prox LAD to Mid LAD lesion is 50% stenosed.  Ost 1st Diag lesion is 90% stenosed.  1st Diag lesion is 80% stenosed.  The left ventricular ejection fraction is 50-55% by visual estimate.  There is mild left ventricular systolic  dysfunction.  1. Severe multivessel coronary artery disease with severe stenosis of the proximal LAD/first diagonal bifurcation, first obtuse marginal Anamika Kueker of the circumflex, and PDA Ailany Koren of the RCA. 2. Mild segmental LV systolic dysfunction with hypokinesis of the distal anterolateral and apical walls, LVEF estimated at 50 to 55%.  08/2017 TEE  Aortic valve: The valve is trileaflet. No stenosis. No regurgitation.  Mitral valve: Mild regurgitation. Central jet.  Right ventricle: Normal cavity size, wall thickness and ejection fraction.  Pericardium: Trivial pericardial effusion.  Tricuspid valve: No regurgitation  Pulmonic valve: No regurgitation by color doppler.  Left ventricle: Regional wall motion abnormalities present, dyskinetic basal and apical septal wall, hypokinesis of lateral wall and inferoseptal wall. Increased wall thickness. LVEF 35-40%.  No ASD/PFO present  Unable to rule out thrombus in LAA but velocities make thrombus less likely.  05/2017 echo Study Conclusions  - Left ventricle: The cavity size was normal. There was mild concentric hypertrophy. Systolic function was mildly to moderately reduced. The estimated ejection fraction was in the range of 40% to 45%. Hypokinesis of the anteroseptal, inferoseptal and basal to mid inferior myocardium. Doppler parameters are consistent with abnormal left ventricular relaxation (grade 1 diastolic dysfunction). Doppler parameters are consistent with indeterminate ventricular filling pressure. - Aortic valve: Transvalvular velocity was within the normal range. There was no stenosis. There was no regurgitation. - Mitral valve: Transvalvular velocity was within the normal range. There was no evidence for stenosis. There was no regurgitation. - Right ventricle: The cavity size was normal. Wall thickness was normal. Systolic function was normal. - Tricuspid valve: There was trivial regurgitation.  - Global longitudinal strain -11.9%.  01/2018 echo Study Conclusions  - Left ventricle: The cavity size was normal. Wall thickness was increased in a pattern of mild LVH. Systolic function was normal. The estimated ejection fraction was in the range of 50% to 55%. Regional wall motion abnormalities cannot be excluded. Doppler parameters are consistent with abnormal left ventricular relaxation (grade 1 diastolic dysfunction). - Ventricular septum: Septal motion showed abnormal function and dyssynergy. These changes are consistent with a left bundle Kevan Prouty block. - Mitral valve: Mildly thickened leaflets .     Assessment and Plan  1.Chronic diastolic HF - labile weights, she is only taking her torsemide prn when she gains 8 lbs or more - will dose torsemide 20mg  prn weight abouve 234 lbs to see if can more consistently control her fluid status and breathing without overstressing kidneys. On days she takes toresmide also take her prn KCl - check BMET/Mg in 2 weeks  2.CAD - beta blocker stopped due to fatigue.  - no symptoms, continue current meds - EKG today shows SR, chronic LBBB  3. Hyperlipidemia - has not tolerated statins - repeat lipid panel to decide between zetia or repatha as alternative, if mildly elevaetd LDL would favor zetia   F?u 4 months      Arnoldo Lenis, M.D.

## 2019-02-12 NOTE — Patient Instructions (Addendum)
Medication Instructions:   Your physician has recommended you make the following change in your medication:   Take torsemide 20 mg by mouth daily as needed for weight greater than 234 lbs  Take potassium 20 meq by mouth daily as needed with torsemide as needed  Continue all other medications the same  Labwork: Your physician recommends that you return for a FASTING lipid, BMET and Mg levels in 2 weeks around 02/26/2019. Please do not eat or drink for at least 8 hours when you have this done. You may take your medications that morning with a sip of water. You may have this done at [facility] or [facility].  Testing/Procedures:  NONE  Follow-Up:  Your physician recommends that you schedule a follow-up appointment in: 4 months. You will receive a reminder letter in the mail in about 1-2 months reminding you to call and schedule your appointment. If you don't receive this letter, please contact our office.  Any Other Special Instructions Will Be Listed Below (If Applicable).  If you need a refill on your cardiac medications before your next appointment, please call your pharmacy.

## 2019-02-15 ENCOUNTER — Telehealth: Payer: Self-pay | Admitting: Cardiology

## 2019-02-15 NOTE — Telephone Encounter (Signed)
Patient called in regards to a new medication that she is suppose to start. Called [facility] They have not received prescription.

## 2019-02-18 ENCOUNTER — Ambulatory Visit (INDEPENDENT_AMBULATORY_CARE_PROVIDER_SITE_OTHER): Payer: Medicare Other | Admitting: Orthopaedic Surgery

## 2019-02-18 ENCOUNTER — Other Ambulatory Visit: Payer: Self-pay

## 2019-02-18 DIAGNOSIS — M1711 Unilateral primary osteoarthritis, right knee: Secondary | ICD-10-CM | POA: Diagnosis not present

## 2019-02-18 MED ORDER — HYALURONAN 30 MG/2ML IX SOSY
30.0000 mg | PREFILLED_SYRINGE | INTRA_ARTICULAR | Status: AC | PRN
  Administered 2019-02-18: 30 mg via INTRA_ARTICULAR

## 2019-02-18 MED ORDER — LIDOCAINE HCL 1 % IJ SOLN
0.5000 mL | INTRAMUSCULAR | Status: AC | PRN
  Administered 2019-02-18: .5 mL

## 2019-02-18 NOTE — Progress Notes (Signed)
Office Visit Note   Patient: Norma [name]           Date of Birth: 19-Dec-1950           MRN: [id number] Visit Date: 02/18/2019              Requested by: Sinda Du, MD [address] [place],  [place] [address] PCP: Sinda Du, MD   Assessment & Plan: Visit Diagnoses:  1. Unilateral primary osteoarthritis, right knee     Plan: Second Orthovisc injection performed return 1 week for final third injection.  Follow-Up Instructions: No follow-ups on file.   Orders:  Orders Placed This Encounter  Procedures  . Large Joint Inj   No orders of the defined types were placed in this encounter.     Procedures: Large Joint Inj: R knee on 02/18/2019 10:44 AM Indications: pain and joint swelling Details: 22 G 1.5 in needle, anterolateral approach  Arthrogram: No  Medications: 0.5 mL lidocaine 1 %; 30 mg Hyaluronan 30 MG/2ML Outcome: tolerated well, no immediate complications Procedure, treatment alternatives, risks and benefits explained, specific risks discussed. Consent was given by the patient. Immediately prior to procedure a time out was called to verify the correct patient, procedure, equipment, support staff and site/side marked as required. Patient was prepped and draped in the usual sterile fashion.       Clinical Data: No additional findings.   Subjective: No chief complaint on file.   HPI 68 year old female with knee osteoarthritis is here for her second Orthovisc injection with previous injection last week done by Dr. Luna Glasgow.  She is requesting to do her second injection in her knee. Previous x-rays by Dr. Durward Fortes 2019 showed tricompartmental degenerative arthritis. Review of Systems see previous history.   Objective: Vital Signs: There were no vitals taken for this visit.  Physical Exam unchanged.  Ortho Exam patient has multiple areas where she scratched her leg bilaterally anteriorly but not posteriorly.  No synovitis of the knee.   Previous injection site anteromedially is healed.  Specialty Comments:  No specialty comments available.  Imaging: No results found.   PMFS History: Patient Active Problem List   Diagnosis Date Noted  . Unilateral primary osteoarthritis, right knee 02/18/2019  . Bilateral primary osteoarthritis of knee 01/20/2019  . Diabetic neuropathy ([place]) 09/16/2018  . Unilateral primary osteoarthritis, left knee 09/16/2018  . Class 3 severe obesity due to excess calories with serious comorbidity and body mass index (BMI) of 45.0 to 49.9 in adult ([place]) 02/20/2018  . Mixed hyperlipidemia 09/11/2017  . Pressure injury of skin 09/02/2017  . Coronary artery disease involving native heart without angina pectoris   . S/P CABG x 3   . Leukocytosis   . Acute blood loss anemia   . Post-operative pain   . Bipolar affective disorder ([place])   . Diabetes mellitus type 2 in obese ([place])   . History of CVA (cerebrovascular accident)   . Hypokalemia   . Hx of CABG 08/29/2017  . Coronary artery disease involving native coronary artery of native heart with unstable angina pectoris ([place]) 08/28/2017  . Unstable angina ([place]) 08/27/2017  . Cardiomyopathy ([place]) 08/22/2017  . Stroke (cerebrum) ([place]) 05/16/2017  . Low hemoglobin 09/19/2016  . Dizziness 06/21/2016  . Chest pain with moderate risk for cardiac etiology 04/23/2016  . Bipolar I disorder, most recent episode depressed ([place]) 06/05/2015  . DM type 2 causing vascular disease ([place]) 09/20/2014  . Chronic bilateral low back pain with bilateral sciatica 09/20/2014  .  Morbid obesity, unspecified obesity type ([place]) 09/20/2014  . Memory difficulty 09/20/2014  . Lumbosacral spondylosis without myelopathy 12/25/2012  . Gastroparesis 11/04/2012  . Nausea with vomiting 11/04/2012  . Restless legs syndrome (RLS) 09/17/2012  . Headache(784.0) 03/19/2012  . GERD (gastroesophageal reflux disease) 02/16/2012  . Abdominal pain 02/16/2012  . Hypothyroidism 02/16/2012   . Tremor 02/16/2012  . Hyperlipidemia associated with type 2 diabetes mellitus ([place]) 04/01/2008  . Obstructive sleep apnea 04/01/2008  . Essential hypertension, benign 04/01/2008  . LBBB (left bundle branch block) 04/01/2008   Past Medical History:  Diagnosis Date  . Anemia   . Anxiety   . Bipolar disorder ([place])   . Bulging lumbar disc    L3-4  . Chronic daily headache   . Chronic low back pain 09/20/2014  . COPD (chronic obstructive pulmonary disease) ([place])   . Degenerative arthritis   . Depression   . Diabetes mellitus, type II ([place])   . Diabetic neuropathy ([place]) 09/16/2018  . DM type 2 with diabetic peripheral neuropathy ([place]) 09/20/2014  . Dyslipidemia   . Dyspnea   . Gastroparesis   . GERD (gastroesophageal reflux disease)   . Heart murmur   . History of hiatal hernia   . Hypothyroidism   . IBS (irritable bowel syndrome)   . Memory difficulty 09/20/2014  . Morbid obesity ([place])   . Neuropathy   . Obstructive sleep apnea on CPAP   . Restless legs syndrome (RLS) 09/17/2012  . Stroke (cerebrum) ([place]) 05/16/2017   Left parietal    Family History  Problem Relation Age of Onset  . Hypertension Mother   . Lymphoma Mother   . Depression Mother   . Arthritis Father   . Alcohol abuse Father   . Hypertension Sister   . Cancer Brother        kidney and lung  . Alcohol abuse Brother   . Alcohol abuse Paternal Uncle   . Alcohol abuse Paternal Grandfather   . Alcohol abuse Paternal Grandmother     Past Surgical History:  Procedure Laterality Date  . ABDOMINAL HYSTERECTOMY    . ARTHROSCOPY KNEE W/ DRILLING  06/2011   and Decemer of 2013.  [name] Carpel tunnel  1980's  . CATARACT EXTRACTION W/PHACO Right 03/21/2017   Procedure: CATARACT EXTRACTION PHACO AND INTRAOCULAR LENS PLACEMENT RIGHT EYE;  Surgeon: Baruch Goldmann, MD;  Location: [facility];  Service: Ophthalmology;  Laterality: Right;  CDE: 2.91   . CATARACT EXTRACTION W/PHACO Left 04/18/2017   Procedure: CATARACT EXTRACTION PHACO AND  INTRAOCULAR LENS PLACEMENT LEFT EYE;  Surgeon: Baruch Goldmann, MD;  Location: [facility];  Service: Ophthalmology;  Laterality: Left;  left  . CHOLECYSTECTOMY    . COLONOSCOPY WITH PROPOFOL N/A 10/04/2016   Procedure: COLONOSCOPY WITH PROPOFOL;  Surgeon: Rogene Houston, MD;  [facility];  Service: Endoscopy;  Laterality: N/A;  11:10  . CORONARY ARTERY BYPASS GRAFT N/A 08/29/2017   Procedure: CORONARY ARTERY BYPASS GRAFTING (CABG) x 3 WITH ENDOSCOPIC HARVESTING OF RIGHT SAPHENOUS VEIN;  Surgeon: Ivin Poot, MD;  Location: [facility];  Service: Open Heart Surgery;  Laterality: N/A;  . ESOPHAGOGASTRODUODENOSCOPY N/A 04/25/2016   Procedure: ESOPHAGOGASTRODUODENOSCOPY (EGD);  Surgeon: Rogene Houston, MD;  [facility];  Service: Endoscopy;  Laterality: N/A;  730  . LEFT HEART CATH AND CORONARY ANGIOGRAPHY N/A 08/27/2017   Procedure: LEFT HEART CATH AND CORONARY ANGIOGRAPHY;  Surgeon: Sherren Mocha, MD;  Location: [facility];  Service: Cardiovascular::  pLAD 95% -  p-mLAD 50%, ostD1 90% -pD1 80%; ostOM1 90%; rPDA 80%. EF ~50-55% - HK of dital Anterolateral & Apical wall.  - Rec CVTS c/s  . RECTAL SURGERY     fissure  . SHOULDER SURGERY Left    arthroscopy in March of this year  . TEE WITHOUT CARDIOVERSION N/A 08/29/2017   Procedure: TRANSESOPHAGEAL ECHOCARDIOGRAM (TEE);  Surgeon: Prescott Gum, Collier Salina, MD;  Location: [facility];  Service: Open Heart Surgery;  Laterality: N/A;  . TRANSTHORACIC ECHOCARDIOGRAM  06/06/2017   Mild to moderate reduced EF 40 and 45%.  Anterior septal, inferoseptal and basal to mid inferior hypokinesis.  GR 1 DD.  No significant valvular lesion   Social History   Occupational History    Employer: [employer]  Tobacco Use  . Smoking status: Former Smoker    Types: Cigarettes    Quit date: 02/04/1971    Years since quitting: 48.0  . Smokeless tobacco: Never Used  . Tobacco comment: smoked 2 cigarettes a day  Substance and Sexual Activity  .  Alcohol use: No    Alcohol/week: 0.0 standard drinks  . Drug use: No  . Sexual activity: Never

## 2019-02-18 NOTE — Telephone Encounter (Signed)
Spoke with patient and she wanted to know if zetia would be sent before lab work or after review of lab work. Advised that after her lipids are checked, her provider will decide at that time which cholesterol medications she will try. Verbalized understanding of plan.

## 2019-02-19 ENCOUNTER — Telehealth: Payer: Self-pay | Admitting: "Endocrinology

## 2019-02-19 NOTE — Telephone Encounter (Signed)
Patient said she can not afford the [place], she would like to know could she increase her metformin and come off the [place]?

## 2019-02-22 NOTE — Telephone Encounter (Signed)
Pt was put in for phone visit to discuss with Dr Dorris Fetch. Will have meter/logs available.

## 2019-02-23 ENCOUNTER — Encounter: Payer: Self-pay | Admitting: "Endocrinology

## 2019-02-23 ENCOUNTER — Other Ambulatory Visit: Payer: Self-pay

## 2019-02-23 ENCOUNTER — Ambulatory Visit (INDEPENDENT_AMBULATORY_CARE_PROVIDER_SITE_OTHER): Payer: Medicare Other | Admitting: "Endocrinology

## 2019-02-23 DIAGNOSIS — I2510 Atherosclerotic heart disease of native coronary artery without angina pectoris: Secondary | ICD-10-CM

## 2019-02-23 DIAGNOSIS — E1159 Type 2 diabetes mellitus with other circulatory complications: Secondary | ICD-10-CM | POA: Diagnosis not present

## 2019-02-23 MED ORDER — GLIPIZIDE ER 5 MG PO TB24: 5.0000 mg | ORAL_TABLET | Freq: Every day | ORAL | 3 refills | Status: DC

## 2019-02-23 NOTE — Progress Notes (Signed)
02/23/2019                                                    Endocrinology Telehealth Visit Follow up Note -During COVID -19 Pandemic  This visit type was conducted due to national recommendations for restrictions regarding the COVID-19 Pandemic  in an effort to limit this patient's exposure and mitigate transmission of the corona virus.  Due to her co-morbid illnesses, Norma [name] is at  moderate to high risk for complications without adequate follow up.  This format is felt to be most appropriate for her at this time.  I connected with this patient on 02/23/2019   by telephone and verified that I am speaking with the correct person using two identifiers. Norma [name], [birth year]. she has verbally consented to this visit. All issues noted in this document were discussed and addressed. The format was not optimal for physical exam.     Subjective:    Patient ID: Norma [name], female    DOB: 1950-11-07,    Past Medical History:  Diagnosis Date  . Anemia   . Anxiety   . Bipolar disorder ([place])   . Bulging lumbar disc    L3-4  . Chronic daily headache   . Chronic low back pain 09/20/2014  . COPD (chronic obstructive pulmonary disease) ([place])   . Degenerative arthritis   . Depression   . Diabetes mellitus, type II ([place])   . Diabetic neuropathy ([place]) 09/16/2018  . DM type 2 with diabetic peripheral neuropathy ([place]) 09/20/2014  . Dyslipidemia   . Dyspnea   . Gastroparesis   . GERD (gastroesophageal reflux disease)   . Heart murmur   . History of hiatal hernia   . Hypothyroidism   . IBS (irritable bowel syndrome)   . Memory difficulty 09/20/2014  . Morbid obesity ([place])   . Neuropathy   . Obstructive sleep apnea on CPAP   . Restless legs syndrome (RLS) 09/17/2012  . Stroke (cerebrum) ([place]) 05/16/2017   Left parietal   Past Surgical History:  Procedure Laterality Date  . ABDOMINAL HYSTERECTOMY    . ARTHROSCOPY KNEE W/ DRILLING  06/2011   and Decemer of 2013.  [name] Carpel tunnel   1980's  . CATARACT EXTRACTION W/PHACO Right 03/21/2017   Procedure: CATARACT EXTRACTION PHACO AND INTRAOCULAR LENS PLACEMENT RIGHT EYE;  Surgeon: Baruch Goldmann, MD;  Location: [facility];  Service: Ophthalmology;  Laterality: Right;  CDE: 2.91   . CATARACT EXTRACTION W/PHACO Left 04/18/2017   Procedure: CATARACT EXTRACTION PHACO AND INTRAOCULAR LENS PLACEMENT LEFT EYE;  Surgeon: Baruch Goldmann, MD;  Location: [facility];  Service: Ophthalmology;  Laterality: Left;  left  . CHOLECYSTECTOMY    . COLONOSCOPY WITH PROPOFOL N/A 10/04/2016   Procedure: COLONOSCOPY WITH PROPOFOL;  Surgeon: Rogene Houston, MD;  [facility];  Service: Endoscopy;  Laterality: N/A;  11:10  . CORONARY ARTERY BYPASS GRAFT N/A 08/29/2017   Procedure: CORONARY ARTERY BYPASS GRAFTING (CABG) x 3 WITH ENDOSCOPIC HARVESTING OF RIGHT SAPHENOUS VEIN;  Surgeon: Ivin Poot, MD;  Location: [facility];  Service: Open Heart Surgery;  Laterality: N/A;  . ESOPHAGOGASTRODUODENOSCOPY N/A 04/25/2016   Procedure: ESOPHAGOGASTRODUODENOSCOPY (EGD);  Surgeon: Rogene Houston, MD;  [facility];  Service: Endoscopy;  Laterality: N/A;  730  . LEFT HEART CATH AND CORONARY ANGIOGRAPHY  N/A 08/27/2017   Procedure: LEFT HEART CATH AND CORONARY ANGIOGRAPHY;  Surgeon: Sherren Mocha, MD;  Location: [facility];  Service: Cardiovascular::  pLAD 95% - p-mLAD 50%, ostD1 90% -pD1 80%; ostOM1 90%; rPDA 80%. EF ~50-55% - HK of dital Anterolateral & Apical wall.  - Rec CVTS c/s  . RECTAL SURGERY     fissure  . SHOULDER SURGERY Left    arthroscopy in March of this year  . TEE WITHOUT CARDIOVERSION N/A 08/29/2017   Procedure: TRANSESOPHAGEAL ECHOCARDIOGRAM (TEE);  Surgeon: Prescott Gum, Collier Salina, MD;  Location: [facility];  Service: Open Heart Surgery;  Laterality: N/A;  . TRANSTHORACIC ECHOCARDIOGRAM  06/06/2017   Mild to moderate reduced EF 40 and 45%.  Anterior septal, inferoseptal and basal to mid inferior hypokinesis.  GR 1 DD.  No significant  valvular lesion   Social History   Socioeconomic History  . Marital status: Single    Spouse name: Not on file  . Number of children: 0  . Years of education: 36  . Highest education level: Not on file  Occupational History    Employer: [employer]  . Financial resource strain: Not on file  . Food insecurity    Worry: Not on file    Inability: Not on file  . Transportation needs    Medical: Not on file    Non-medical: Not on file  Tobacco Use  . Smoking status: Former Smoker    Types: Cigarettes    Quit date: 02/04/1971    Years since quitting: 48.0  . Smokeless tobacco: Never Used  . Tobacco comment: smoked 2 cigarettes a day  Substance and Sexual Activity  . Alcohol use: No    Alcohol/week: 0.0 standard drinks  . Drug use: No  . Sexual activity: Never  Lifestyle  . Physical activity    Days per week: Not on file    Minutes per session: Not on file  . Stress: Not on file  Relationships  . Social Herbalist on phone: Not on file    Gets together: Not on file    Attends religious service: Not on file    Active member of club or organization: Not on file    Attends meetings of clubs or organizations: Not on file    Relationship status: Not on file  Other Topics Concern  . Not on file  Social History Narrative   Patient lives at home alone.    Patient has no children.    Patient has her masters in nursing.    Patient is single.    Patient drinks about 2 glasses of tea daily.   Patient is right handed.   Outpatient Encounter Medications as of 02/23/2019  Medication Sig  . aspirin EC 81 MG tablet Take 1 tablet (81 mg total) by mouth daily.  . baclofen (LIORESAL) 10 MG tablet TAKE ONE TABLET BY MOUTH AT BEDTIME.  [name] buPROPion (WELLBUTRIN XL) 300 MG 24 hr tablet Take 1 tablet (300 mg total) by mouth every morning.  . calcium carbonate (TUMS - DOSED IN MG ELEMENTAL CALCIUM) 500 MG chewable tablet Chew 2 tablets by mouth as needed  for indigestion or heartburn.   . carvedilol (COREG) 6.25 MG tablet TAKE ONE TABLET BY MOUTH BID(MORNING ,EVENING)  . EPINEPHrine (EPIPEN 2-PAK) 0.3 mg/0.3 mL IJ SOAJ injection Inject 0.3 mg into the muscle once.  [name] esomeprazole (NEXIUM) 40 MG capsule Take 40 mg by mouth at bedtime.   [name]  gabapentin (NEURONTIN) 300 MG capsule Take 1 capsule (300 mg total) by mouth 2 (two) times daily.  [name] glipiZIDE (GLUCOTROL XL) 5 MG 24 hr tablet Take 1 tablet (5 mg total) by mouth daily with breakfast.  . hydrOXYzine (ATARAX/VISTARIL) 10 MG tablet Take 1 tablet (10 mg total) by mouth 3 (three) times daily as needed for itching.  . Lactase (LACTAID FAST ACT) 9000 units TABS Take by mouth. Patient states that she takes as needed.  . lamoTRIgine (LAMICTAL) 150 MG tablet Take 1 tablet (150 mg total) by mouth at bedtime.  [name] levothyroxine (SYNTHROID, LEVOTHROID) 100 MCG tablet Take 100 mcg by mouth daily before breakfast.   . losartan (COZAAR) 25 MG tablet TAKE ONE TABLET BY MOUTH DAILY (MORNING)  . meclizine (ANTIVERT) 25 MG tablet Take 1 tablet (25 mg total) by mouth 3 (three) times daily as needed for dizziness. (Patient taking differently: Take 25 mg by mouth daily as needed for dizziness. )  . metFORMIN (GLUCOPHAGE) 500 MG tablet Take 1 tablet (500 mg total) by mouth 2 (two) times daily with a meal.  . ondansetron (ZOFRAN) 4 MG tablet Take 1 tablet (4 mg total) by mouth 2 (two) times daily as needed for nausea or vomiting.  . potassium chloride SA (KLOR-CON) 20 MEQ tablet Take 1 tablet (20 mEq total) by mouth daily as needed (when taking torsemide). 3 x week  . rOPINIRole (REQUIP) 2 MG tablet Take 2 mg by mouth 3 (three) times daily. .  . temazepam (RESTORIL) 15 MG capsule Take 1 capsule (15 mg total) by mouth at bedtime as needed for sleep.  [name] torsemide (DEMADEX) 20 MG tablet Take 1 tablet (20 mg total) by mouth daily as needed (for weight greater than 234 lbs).  . traMADol (ULTRAM) 50 MG tablet Take 100 mg by mouth  4 (four) times daily as needed.   . venlafaxine XR (EFFEXOR-XR) 150 MG 24 hr capsule TAKE TWO (2) CAPSULES BY MOUTH AT BEDTIME.  . [DISCONTINUED] Insulin Degludec 100 UNIT/ML SOLN Inject into the skin. Monitoring - only gives when necessary   No facility-administered encounter medications on file as of 02/23/2019.    ALLERGIES: Allergies  Allergen Reactions  . Amoxicillin Anaphylaxis and Other (See Comments)    Has patient had a PCN reaction causing immediate rash, facial/tongue/throat swelling, SOB or lightheadedness with hypotension: Yes Has patient had a PCN reaction causing severe rash involving mucus membranes or skin necrosis: Yes Has patient had a PCN reaction that required hospitalization: Yes Has patient had a PCN reaction occurring within the last 10 years: Yes If all of the above answers are "NO", then may proceed with Cephalosporin use.   [name] Hydrocodone Anaphylaxis  . Percocet [Oxycodone-Acetaminophen] Other (See Comments)    Causes chest pain /tightness. Pressure pain around her ribs.  . Vancomycin Itching  . Depacon [Valproic Acid] Other (See Comments)    Causes falls    VACCINATION STATUS: Immunization History  Administered Date(s) Administered  . Influenza,inj,Quad PF,6+ Mos 01/20/2018    Diabetes She presents for her follow-up diabetic visit. She has type 2 diabetes mellitus. Onset time: She was diagnosed at approximate age of 32 years. Her disease course has been improving (She called due to a skin rash from [place] There are no hypoglycemic associated symptoms. Pertinent negatives for hypoglycemia include no confusion, headaches, pallor or seizures. Associated symptoms include fatigue. Pertinent negatives for diabetes include no chest pain, no polydipsia, no polyphagia and no polyuria. There are no hypoglycemic complications. Symptoms are  improving. Diabetic complications include heart disease. (Since her last visit, she underwent coronary artery bypass graft for 3  coronary vessels.) Risk factors for coronary artery disease include diabetes mellitus, dyslipidemia, hypertension, obesity and sedentary lifestyle. Current diabetic treatment includes oral agent (dual therapy) and insulin injections. She is compliant with treatment most of the time. Her weight is stable. She is following a generally unhealthy diet. She has not had a previous visit with a dietitian. She never participates in exercise. Her breakfast blood glucose range is generally 130-140 mg/dl. Her lunch blood glucose range is generally 130-140 mg/dl. Her dinner blood glucose range is generally 130-140 mg/dl. Her bedtime blood glucose range is generally 130-140 mg/dl. Her overall blood glucose range is 130-140 mg/dl. (She reports that she developed rash from [place] and decided to stop it for the last 15 days.  Over the last 10 days her blood glucose readings range from 110-153 only on Metformin 500 mg p.o. twice daily.) An ACE inhibitor/angiotensin II receptor blocker is being taken. Eye exam is current.  Hypertension This is a chronic problem. The current episode started more than 1 year ago. Pertinent negatives include no chest pain, headaches, palpitations or shortness of breath. Past treatments include ACE inhibitors. Compliance problems include diet, exercise, medication cost, medication side effects and psychosocial issues.  Identifiable causes of hypertension include a thyroid problem.  Hyperlipidemia This is a chronic problem. The current episode started more than 1 year ago. Pertinent negatives include no chest pain, myalgias or shortness of breath. Current antihyperlipidemic treatment includes statins. Risk factors for coronary artery disease include a sedentary lifestyle, obesity, hypertension, diabetes mellitus and dyslipidemia.  Thyroid Problem Presents for follow-up visit. Symptoms include fatigue. Patient reports no cold intolerance, diarrhea, heat intolerance or palpitations. The symptoms have  been stable. Past treatments include levothyroxine. Her past medical history is significant for hyperlipidemia.   Review of systems: Limited as above.    Objective:    There were no vitals taken for this visit.  Wt Readings from Last 3 Encounters:  02/12/19 238 lb 6.4 oz (108.1 kg)  02/10/19 237 lb (107.5 kg)  12/17/18 237 lb (107.5 kg)      CMP Latest Ref Rng & Units 10/30/2018 04/24/2018 01/29/2018  Glucose 70 - 99 mg/dL 96 - 163(H)  BUN 8 - 23 mg/dL 15 13 20   Creatinine 0.44 - 1.00 mg/dL 1.27(H) 0.9 1.10(H)  Sodium 135 - 145 mmol/L 137 - 137  Potassium 3.5 - 5.1 mmol/L 3.4(L) - 4.2  Chloride 98 - 111 mmol/L 98 - 91(L)  CO2 22 - 32 mmol/L 28 - 33(H)  Calcium 8.9 - 10.3 mg/dL 8.9 - 8.5(L)  Total Protein 6.5 - 8.1 g/dL 6.4(L) - 7.1  Total Bilirubin 0.3 - 1.2 mg/dL 0.4 - 0.2  Alkaline Phos 38 - 126 U/L 42 - -  AST 15 - 41 U/L 15 - 11  ALT 0 - 44 U/L 12 - 12   Lipid Panel     Component Value Date/Time   CHOL 176 04/24/2018   TRIG 114 04/24/2018   HDL 59 04/24/2018   CHOLHDL 3.4 07/10/2016 0934   VLDL 31 (H) 07/10/2016 0934   LDLCALC 96 04/24/2018     Diabetic Labs (most recent): Lab Results  Component Value Date   HGBA1C 6.4 (H) 10/30/2018   HGBA1C 7.2 04/24/2018   HGBA1C 7.7 (H) 01/29/2018    Assessment & Plan:   1. Uncontrolled type 2 diabetes mellitus with coronary artery disease status post coronary artery  bypass graft   Her diabetes is  complicated by coronary artery disease, neuropathy and patient remains at a high risk for more acute and chronic complications of diabetes which include CAD, CVA, CKD, retinopathy, and neuropathy. These are all discussed in detail with the patient. -She reports near target glycemic profile both fasting and postprandial, despite the fact that she stopped insulin more than 15 days ago.  She stopped insulin due to nonspecific skin rash.  She would like to stay off of insulin if she can.  Her previsit labs show A1c of 6.4%,  improving from 7.2%.  Of note, she has mild anemia on treatment.   - I have re-counseled the patient on diet management and weight loss  by adopting a carbohydrate restricted / protein rich  Diet.  - she  admits there is a room for improvement in her diet and drink choices. -  Suggestion is made for her to avoid simple carbohydrates  from her diet including Cakes, Sweet Desserts / Pastries, Ice Cream, Soda (diet and regular), Sweet Tea, Candies, Chips, Cookies, Sweet Pastries,  Store Bought Juices, Alcohol in Excess of  1-2 drinks a day, Artificial Sweeteners, Coffee Creamer, and "Sugar-free" Products. This will help patient to have stable blood glucose profile and potentially avoid unintended weight gain.   - Patient is advised to stick to a routine mealtimes to eat 3 meals  a day and avoid unnecessary snacks ( to snack only to correct hypoglycemia).  - I have approached patient with the following individualized plan to manage diabetes and patient agrees.  -She will be tried off of insulin, agrees to continue monitoring blood glucose twice a day-daily before breakfast and at bedtime.   -She is advised to lower metformin to 500 mg by mouth twice a day   with meals-after breakfast and after supper.  -I discussed and added glipizide 5 mg XL p.o. daily with breakfast.  - Patient will be considered for incretin therapy as appropriate next visit. - Patient specific target  for A1c; LDL, HDL, Triglycerides, and  Waist Circumference were discussed in detail.  2) BP/HTN: she is advised to home monitor blood pressure and report if > 140/90 on 2 separate readings.  -She is advised to continue on her current medications including Lasix, metoprolol.  3) Lipids/HPL: Her recent lipid panel showed LDL uncontrolled at 83.  She is advised to continue Lipitor 10 mg p.o. daily.    4)  Weight/Diet: CDE consult in progress, exercise, and carbohydrates information provided.  5) Hypothyroidism  - Her previsit  thyroid function tests are consistent with appropriate replacement.    She is now on amiodarone 200 mg p.o. twice daily. -She is advised to continue levothyroxine 100 mcg p.o. every morning.    - We discussed about the correct intake of her thyroid hormone, on empty stomach at fasting, with water, separated by at least 30 minutes from breakfast and other medications,  and separated by more than 4 hours from calcium, iron, multivitamins, acid reflux medications (PPIs). -Patient is made aware of the fact that thyroid hormone replacement is needed for life, dose to be adjusted by periodic monitoring of thyroid function tests.   6) Chronic Care/Health Maintenance:  -Patient is on ACEI/ARB and Statin medications and encouraged to continue to follow up with Ophthalmology, Podiatrist at least yearly or according to recommendations, and advised to  stay away from smoking. I have recommended yearly flu vaccine and pneumonia vaccination at least every 5 years; moderate intensity exercise  for up to 150 minutes weekly; and  sleep for at least 7 hours a day.  I advised patient to maintain close follow up with her PCP for primary care needs. - Patient Care Time Today:  25 min, of which >50% was spent in  counseling and the rest reviewing her  current and  previous labs/studies, previous treatments, her blood glucose readings, and medications' doses and developing a plan for long-term care based on the latest recommendations for standards of care.   Norma [name] participated in the discussions, expressed understanding, and voiced agreement with the above plans.  All questions were answered to her satisfaction. she is encouraged to contact clinic should she have any questions or concerns prior to her return visit.  Follow up plan: Return keep her regular appointment with labs.  Glade Lloyd, MD Phone: [phone]  Fax: [phone]  This note was partially dictated with voice recognition software. Similar  sounding words can be transcribed inadequately or may not  be corrected upon review.  02/23/2019, 9:20 AM

## 2019-02-25 ENCOUNTER — Encounter: Payer: Self-pay | Admitting: Orthopaedic Surgery

## 2019-02-25 ENCOUNTER — Ambulatory Visit: Payer: Medicare Other | Admitting: Orthopaedic Surgery

## 2019-02-25 ENCOUNTER — Ambulatory Visit (INDEPENDENT_AMBULATORY_CARE_PROVIDER_SITE_OTHER): Payer: Medicare Other | Admitting: Orthopaedic Surgery

## 2019-02-25 VITALS — Ht 62.0 in | Wt 238.0 lb

## 2019-02-25 DIAGNOSIS — M1711 Unilateral primary osteoarthritis, right knee: Secondary | ICD-10-CM

## 2019-02-25 DIAGNOSIS — M170 Bilateral primary osteoarthritis of knee: Secondary | ICD-10-CM

## 2019-02-25 MED ORDER — LIDOCAINE HCL 1 % IJ SOLN
0.5000 mL | INTRAMUSCULAR | Status: AC | PRN
  Administered 2019-02-25: .5 mL

## 2019-02-25 MED ORDER — HYALURONAN 30 MG/2ML IX SOSY
30.0000 mg | PREFILLED_SYRINGE | INTRA_ARTICULAR | Status: AC | PRN
  Administered 2019-02-25: 11:00:00 30 mg via INTRA_ARTICULAR

## 2019-02-25 NOTE — Progress Notes (Signed)
Office Visit Note   Patient: Norma [name]           Date of Birth: 20-Sep-1950           MRN: [id number] Visit Date: 02/25/2019              Requested by: Sinda Du, MD [address] [place],  [place] [address] PCP: Sinda Du, MD   Assessment & Plan: Visit Diagnoses:  1. Bilateral primary osteoarthritis of knee     Plan: Third Orthovisc injection performed follow-up as needed.  Follow-Up Instructions: No follow-ups on file.   Orders:  Orders Placed This Encounter  Procedures  . Large Joint Inj   No orders of the defined types were placed in this encounter.     Procedures: Large Joint Inj: R knee on 02/25/2019 11:22 AM Indications: pain and joint swelling Details: 22 G 1.5 in needle, anterolateral approach  Arthrogram: No  Medications: 0.5 mL lidocaine 1 %; 30 mg Hyaluronan 30 MG/2ML Outcome: tolerated well, no immediate complications Procedure, treatment alternatives, risks and benefits explained, specific risks discussed. Consent was given by the patient. Immediately prior to procedure a time out was called to verify the correct patient, procedure, equipment, support staff and site/side marked as required. Patient was prepped and draped in the usual sterile fashion.       Clinical Data: No additional findings.   Subjective: Chief Complaint  Patient presents with  . Right Knee - Pain, Follow-up    Orthovisc #3 injection    HPI right knee Orthovisc injection #3.  Review of Systems unchanged   Objective: Vital Signs: Ht 5\' 2"  (1.575 m)   Wt 238 lb (108 kg)   BMI 43.53 kg/m   Physical Exam HENT:     Head: Normocephalic.     Mouth/Throat:     Mouth: Mucous membranes are moist.  Eyes:     Pupils: Pupils are equal, round, and reactive to light.  Cardiovascular:     Rate and Rhythm: Normal rate.  Pulmonary:     Effort: Pulmonary effort is normal.  Skin:    Comments: Both right and left leg scratches thigh anterior portion of  the leg without rash.     Ortho Exam right leg scratches anterior thighs anterior legs right and left.  Specialty Comments:  No specialty comments available.  Imaging: No results found.   PMFS History: Patient Active Problem List   Diagnosis Date Noted  . Unilateral primary osteoarthritis, right knee 02/18/2019  . Bilateral primary osteoarthritis of knee 01/20/2019  . Diabetic neuropathy ([place]) 09/16/2018  . Unilateral primary osteoarthritis, left knee 09/16/2018  . Class 3 severe obesity due to excess calories with serious comorbidity and body mass index (BMI) of 45.0 to 49.9 in adult ([place]) 02/20/2018  . Mixed hyperlipidemia 09/11/2017  . Pressure injury of skin 09/02/2017  . Coronary artery disease involving native heart without angina pectoris   . S/P CABG x 3   . Leukocytosis   . Acute blood loss anemia   . Post-operative pain   . Bipolar affective disorder ([place])   . Diabetes mellitus type 2 in obese ([place])   . History of CVA (cerebrovascular accident)   . Hypokalemia   . Hx of CABG 08/29/2017  . Coronary artery disease involving native coronary artery of native heart with unstable angina pectoris ([place]) 08/28/2017  . Unstable angina ([place]) 08/27/2017  . Cardiomyopathy ([place]) 08/22/2017  . Stroke (cerebrum) ([place]) 05/16/2017  . Low hemoglobin 09/19/2016  .  Dizziness 06/21/2016  . Chest pain with moderate risk for cardiac etiology 04/23/2016  . Bipolar I disorder, most recent episode depressed ([place]) 06/05/2015  . DM type 2 causing vascular disease ([place]) 09/20/2014  . Chronic bilateral low back pain with bilateral sciatica 09/20/2014  . Morbid obesity, unspecified obesity type ([place]) 09/20/2014  . Memory difficulty 09/20/2014  . Lumbosacral spondylosis without myelopathy 12/25/2012  . Gastroparesis 11/04/2012  . Nausea with vomiting 11/04/2012  . Restless legs syndrome (RLS) 09/17/2012  . Headache(784.0) 03/19/2012  . GERD (gastroesophageal reflux disease) 02/16/2012  .  Abdominal pain 02/16/2012  . Hypothyroidism 02/16/2012  . Tremor 02/16/2012  . Hyperlipidemia associated with type 2 diabetes mellitus ([place]) 04/01/2008  . Obstructive sleep apnea 04/01/2008  . Essential hypertension, benign 04/01/2008  . LBBB (left bundle branch block) 04/01/2008   Past Medical History:  Diagnosis Date  . Anemia   . Anxiety   . Bipolar disorder ([place])   . Bulging lumbar disc    L3-4  . Chronic daily headache   . Chronic low back pain 09/20/2014  . COPD (chronic obstructive pulmonary disease) ([place])   . Degenerative arthritis   . Depression   . Diabetes mellitus, type II ([place])   . Diabetic neuropathy ([place]) 09/16/2018  . DM type 2 with diabetic peripheral neuropathy ([place]) 09/20/2014  . Dyslipidemia   . Dyspnea   . Gastroparesis   . GERD (gastroesophageal reflux disease)   . Heart murmur   . History of hiatal hernia   . Hypothyroidism   . IBS (irritable bowel syndrome)   . Memory difficulty 09/20/2014  . Morbid obesity ([place])   . Neuropathy   . Obstructive sleep apnea on CPAP   . Restless legs syndrome (RLS) 09/17/2012  . Stroke (cerebrum) ([place]) 05/16/2017   Left parietal    Family History  Problem Relation Age of Onset  . Hypertension Mother   . Lymphoma Mother   . Depression Mother   . Arthritis Father   . Alcohol abuse Father   . Hypertension Sister   . Cancer Brother        kidney and lung  . Alcohol abuse Brother   . Alcohol abuse Paternal Uncle   . Alcohol abuse Paternal Grandfather   . Alcohol abuse Paternal Grandmother     Past Surgical History:  Procedure Laterality Date  . ABDOMINAL HYSTERECTOMY    . ARTHROSCOPY KNEE W/ DRILLING  06/2011   and Decemer of 2013.  [name] Carpel tunnel  1980's  . CATARACT EXTRACTION W/PHACO Right 03/21/2017   Procedure: CATARACT EXTRACTION PHACO AND INTRAOCULAR LENS PLACEMENT RIGHT EYE;  Surgeon: Baruch Goldmann, MD;  Location: [facility];  Service: Ophthalmology;  Laterality: Right;  CDE: 2.91   . CATARACT EXTRACTION W/PHACO  Left 04/18/2017   Procedure: CATARACT EXTRACTION PHACO AND INTRAOCULAR LENS PLACEMENT LEFT EYE;  Surgeon: Baruch Goldmann, MD;  Location: [facility];  Service: Ophthalmology;  Laterality: Left;  left  . CHOLECYSTECTOMY    . COLONOSCOPY WITH PROPOFOL N/A 10/04/2016   Procedure: COLONOSCOPY WITH PROPOFOL;  Surgeon: Rogene Houston, MD;  Location: [facility];  Service: Endoscopy;  Laterality: N/A;  11:10  . CORONARY ARTERY BYPASS GRAFT N/A 08/29/2017   Procedure: CORONARY ARTERY BYPASS GRAFTING (CABG) x 3 WITH ENDOSCOPIC HARVESTING OF RIGHT SAPHENOUS VEIN;  Surgeon: Ivin Poot, MD;  Location: [facility];  Service: Open Heart Surgery;  Laterality: N/A;  . ESOPHAGOGASTRODUODENOSCOPY N/A 04/25/2016   Procedure: ESOPHAGOGASTRODUODENOSCOPY (EGD);  Surgeon: Rogene Houston, MD;  [facility];  Service: Endoscopy;  Laterality: N/A;  730  . LEFT HEART CATH AND CORONARY ANGIOGRAPHY N/A 08/27/2017   Procedure: LEFT HEART CATH AND CORONARY ANGIOGRAPHY;  Surgeon: Sherren Mocha, MD;  Location: [facility];  Service: Cardiovascular::  pLAD 95% - p-mLAD 50%, ostD1 90% -pD1 80%; ostOM1 90%; rPDA 80%. EF ~50-55% - HK of dital Anterolateral & Apical wall.  - Rec CVTS c/s  . RECTAL SURGERY     fissure  . SHOULDER SURGERY Left    arthroscopy in March of this year  . TEE WITHOUT CARDIOVERSION N/A 08/29/2017   Procedure: TRANSESOPHAGEAL ECHOCARDIOGRAM (TEE);  Surgeon: Prescott Gum, Collier Salina, MD;  Location: [facility];  Service: Open Heart Surgery;  Laterality: N/A;  . TRANSTHORACIC ECHOCARDIOGRAM  06/06/2017   Mild to moderate reduced EF 40 and 45%.  Anterior septal, inferoseptal and basal to mid inferior hypokinesis.  GR 1 DD.  No significant valvular lesion   Social History   Occupational History    Employer: [employer]  Tobacco Use  . Smoking status: Former Smoker    Types: Cigarettes    Quit date: 02/04/1971    Years since quitting: 48.0  . Smokeless tobacco: Never Used  . Tobacco comment:  smoked 2 cigarettes a day  Substance and Sexual Activity  . Alcohol use: No    Alcohol/week: 0.0 standard drinks  . Drug use: No  . Sexual activity: Never

## 2019-03-01 ENCOUNTER — Other Ambulatory Visit: Payer: Self-pay

## 2019-03-01 ENCOUNTER — Ambulatory Visit (INDEPENDENT_AMBULATORY_CARE_PROVIDER_SITE_OTHER): Payer: Medicare Other | Admitting: Psychiatry

## 2019-03-01 DIAGNOSIS — F331 Major depressive disorder, recurrent, moderate: Secondary | ICD-10-CM | POA: Diagnosis not present

## 2019-03-02 ENCOUNTER — Encounter (HOSPITAL_COMMUNITY): Payer: Self-pay | Admitting: Psychiatry
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
## 2019-03-05 ENCOUNTER — Other Ambulatory Visit: Payer: Self-pay | Admitting: Cardiology

## 2019-03-05 DIAGNOSIS — I5032 Chronic diastolic (congestive) heart failure: Secondary | ICD-10-CM | POA: Diagnosis not present

## 2019-03-05 DIAGNOSIS — E038 Other specified hypothyroidism: Secondary | ICD-10-CM | POA: Diagnosis not present

## 2019-03-05 DIAGNOSIS — I10 Essential (primary) hypertension: Secondary | ICD-10-CM | POA: Diagnosis not present

## 2019-03-05 DIAGNOSIS — E1159 Type 2 diabetes mellitus with other circulatory complications: Secondary | ICD-10-CM | POA: Diagnosis not present

## 2019-03-05 DIAGNOSIS — E782 Mixed hyperlipidemia: Secondary | ICD-10-CM | POA: Diagnosis not present

## 2019-03-06 LAB — LIPID PANEL
Cholesterol: 248 mg/dL — ABNORMAL HIGH (ref <200)
HDL: 65 mg/dL (ref >=50)
LDL Cholesterol (Calc): 152 mg/dL (calc) — ABNORMAL HIGH
Non-HDL Cholesterol (Calc): 183 mg/dL (calc) — ABNORMAL HIGH (ref <130)
Total CHOL/HDL Ratio: 3.8 (calc) (ref <5.0)
Triglycerides: 175 mg/dL — ABNORMAL HIGH (ref <150)

## 2019-03-06 LAB — BASIC METABOLIC PANEL WITH GFR
BUN/Creatinine Ratio: 17 (calc) (ref 6–22)
BUN: 19 mg/dL (ref 7–25)
CO2: 28 mmol/L (ref 20–32)
Calcium: 9.3 mg/dL (ref 8.6–10.4)
Chloride: 101 mmol/L (ref 98–110)
Creat: 1.1 mg/dL — ABNORMAL HIGH (ref 0.50–0.99)
GFR, Est African American: 61 mL/min/{1.73_m2} (ref >=60)
GFR, Est Non African American: 53 mL/min/{1.73_m2} — ABNORMAL LOW (ref >=60)
Glucose, Bld: 145 mg/dL — ABNORMAL HIGH (ref 65–99)
Potassium: 4.4 mmol/L (ref 3.5–5.3)
Sodium: 140 mmol/L (ref 135–146)

## 2019-03-06 LAB — HEMOGLOBIN A1C
Hgb A1c MFr Bld: 6.8 % of total Hgb — ABNORMAL HIGH (ref <5.7)
Mean Plasma Glucose: 148 (calc)
eAG (mmol/L): 8.2 (calc)

## 2019-03-06 LAB — COMPLETE METABOLIC PANEL WITH GFR
AG Ratio: 1.3 (calc) (ref 1.0–2.5)
ALT: 12 U/L (ref 6–29)
AST: 13 U/L (ref 10–35)
Albumin: 3.7 g/dL (ref 3.6–5.1)
Alkaline phosphatase (APISO): 56 U/L (ref 37–153)
BUN/Creatinine Ratio: 16 (calc) (ref 6–22)
BUN: 18 mg/dL (ref 7–25)
CO2: 26 mmol/L (ref 20–32)
Calcium: 9.4 mg/dL (ref 8.6–10.4)
Chloride: 101 mmol/L (ref 98–110)
Creat: 1.14 mg/dL — ABNORMAL HIGH (ref 0.50–0.99)
GFR, Est African American: 58 mL/min/{1.73_m2} — ABNORMAL LOW (ref >=60)
GFR, Est Non African American: 50 mL/min/{1.73_m2} — ABNORMAL LOW (ref >=60)
Globulin: 2.9 g/dL (calc) (ref 1.9–3.7)
Glucose, Bld: 140 mg/dL — ABNORMAL HIGH (ref 65–99)
Potassium: 4.6 mmol/L (ref 3.5–5.3)
Sodium: 140 mmol/L (ref 135–146)
Total Bilirubin: 0.3 mg/dL (ref 0.2–1.2)
Total Protein: 6.6 g/dL (ref 6.1–8.1)

## 2019-03-06 LAB — T4, FREE: Free T4: 1.2 ng/dL (ref 0.8–1.8)

## 2019-03-06 LAB — MAGNESIUM: Magnesium: 1.9 mg/dL (ref 1.5–2.5)

## 2019-03-06 LAB — TSH: TSH: 1.49 mIU/L (ref 0.40–4.50)

## 2019-03-08 ENCOUNTER — Other Ambulatory Visit: Payer: Self-pay

## 2019-03-08 ENCOUNTER — Encounter (HOSPITAL_COMMUNITY): Payer: Self-pay | Admitting: Psychiatry

## 2019-03-08 ENCOUNTER — Ambulatory Visit (INDEPENDENT_AMBULATORY_CARE_PROVIDER_SITE_OTHER): Payer: Medicare Other | Admitting: Psychiatry

## 2019-03-08 DIAGNOSIS — F331 Major depressive disorder, recurrent, moderate: Secondary | ICD-10-CM | POA: Diagnosis not present

## 2019-03-08 DIAGNOSIS — I2510 Atherosclerotic heart disease of native coronary artery without angina pectoris: Secondary | ICD-10-CM | POA: Diagnosis not present

## 2019-03-08 MED ORDER — BUPROPION HCL ER (XL) 300 MG PO TB24: 300.0000 mg | ORAL_TABLET | Freq: Every morning | ORAL | 2 refills | Status: DC

## 2019-03-08 MED ORDER — TEMAZEPAM 15 MG PO CAPS: 15.0000 mg | ORAL_CAPSULE | Freq: Every evening | ORAL | 2 refills | Status: DC | PRN

## 2019-03-08 MED ORDER — LAMOTRIGINE 150 MG PO TABS: 150.0000 mg | ORAL_TABLET | Freq: Every day | ORAL | 2 refills | Status: DC

## 2019-03-08 MED ORDER — VENLAFAXINE HCL ER 150 MG PO CP24: ORAL_CAPSULE | ORAL | 2 refills | Status: DC

## 2019-03-08 NOTE — Progress Notes (Signed)
Virtual Visit via Telephone Note  I connected with Norma [name] on 03/08/19 at  1:20 PM EST by telephone and verified that I am speaking with the correct person using two identifiers.   I discussed the limitations, risks, security and privacy concerns of performing an evaluation and management service by telephone and the availability of in person appointments. I also discussed with the patient that there may be a patient responsible charge related to this service. The patient expressed understanding and agreed to proceed.     I discussed the assessment and treatment plan with the patient. The patient was provided an opportunity to ask questions and all were answered. The patient agreed with the plan and demonstrated an understanding of the instructions.   The patient was advised to call back or seek an in-person evaluation if the symptoms worsen or if the condition fails to improve as anticipated.  I provided 15 minutes of non-face-to-face time during this encounter.   [name], MD  [facility] MD/PA/NP OP Progress Note  03/08/2019 1:42 PM Norma [name]  MRN:  [id number]  Chief Complaint:  Chief Complaint    Depression; Anxiety; Follow-up     HPI: This patient is a 68 year old single white female who lives alone in [place]. 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 She was a patient here but was last seen in 2017. She states that she quit coming because of financial reasons.  Since then the patient has been followed by her primary doctor. She has had other health issues arise and needed CABG. She is having severe problems with knee pain and is trying to lose enough weight to have a knee replacement surgery. She also has chronic back pain. Her psychiatric medications were not really changed much since she last saw me. She states that she still goes to periods of significant depression but most of it sounds situational.  Currently her 13 year old nephew is staying at her  house. She states that he is rude and had been cursing and berating her. She finally put her foot down and got them to stop. She often has conflicts with her parents and even her sister. All of this gets her depressed. After she had been hospitalized for cardiac issues she states that her church did not do anything for her encouraged her to come back and this was very disappointing and hurtful. She has been more isolated lately.  She has been compliant with her medications which include Effexor XR Wellbutrin and Lamictal. She is trying to do a little bit of light housework. Her nephew brought dogs into the home and she enjoys the dogs. She does not currently have any thoughts of self-harm or suicide. She is not sleeping well partly due to chronic pain and partly due to having to urinate because of her blood pressure pill. She tried trazodone 50 mg but it is not helping so I suggested we increase it  The patient returns for follow-up after 4 weeks.  Last time we had changed her trazodone to temazepam and she seems to be sleeping better her mood is fairly good but she claims planes of excessive drowsiness through the day.  I cannot tell that any of her medications would cause severe drowsiness.  She is taking the Effexor XR twice daily and I suggested she take it all at night.  She could try moving the Wellbutrin tonight as well although for most people it gives more energy.  She also has rheumatoid arthritis and  sometimes chronic fatigue goes along with this.  She states that she has been getting out a little bit more and her mood has improved and she denies thoughts of self-harm or suicidal ideation..   Visit Diagnosis:    ICD-10-CM   1. Moderate episode of recurrent major depressive disorder ([place])  F33.1     Past Psychiatric History: Hospitalizations around 2001.  She went through a course of ECT around that time and has been tried on Serzone Effexor Wellbutrin and Zyprexa  Past Medical  History:  Past Medical History:  Diagnosis Date  . Anemia   . Anxiety   . Bipolar disorder ([place])   . Bulging lumbar disc    L3-4  . Chronic daily headache   . Chronic low back pain 09/20/2014  . COPD (chronic obstructive pulmonary disease) ([place])   . Degenerative arthritis   . Depression   . Diabetes mellitus, type II ([place])   . Diabetic neuropathy ([place]) 09/16/2018  . DM type 2 with diabetic peripheral neuropathy ([place]) 09/20/2014  . Dyslipidemia   . Dyspnea   . Gastroparesis   . GERD (gastroesophageal reflux disease)   . Heart murmur   . History of hiatal hernia   . Hypothyroidism   . IBS (irritable bowel syndrome)   . Memory difficulty 09/20/2014  . Morbid obesity ([place])   . Neuropathy   . Obstructive sleep apnea on CPAP   . Restless legs syndrome (RLS) 09/17/2012  . Stroke (cerebrum) ([place]) 05/16/2017   Left parietal     Family Psychiatric History: see below  Family History:  Family History  Problem Relation Age of Onset  . Hypertension Mother   . Lymphoma Mother   . Depression Mother   . Arthritis Father   . Alcohol abuse Father   . Hypertension Sister   . Cancer Brother        kidney and lung  . Alcohol abuse Brother   . Alcohol abuse Paternal Uncle   . Alcohol abuse Paternal Grandfather   . Alcohol abuse Paternal Grandmother     Social History:  Social History   Socioeconomic History  . Marital status: Single    Spouse name: Not on file  . Number of children: 0  . Years of education: 87  . Highest education level: Not on file  Occupational History    Employer: [employer]  . Financial resource strain: Not on file  . Food insecurity    Worry: Not on file    Inability: Not on file  . Transportation needs    Medical: Not on file    Non-medical: Not on file  Tobacco Use  . Smoking status: Former Smoker    Types: Cigarettes    Quit date: 02/04/1971    Years since quitting: 48.1  . Smokeless tobacco: Never Used  . Tobacco comment:  smoked 2 cigarettes a day  Substance and Sexual Activity  . Alcohol use: No    Alcohol/week: 0.0 standard drinks  . Drug use: No  . Sexual activity: Never  Lifestyle  . Physical activity    Days per week: Not on file    Minutes per session: Not on file  . Stress: Not on file  Relationships  . Social Herbalist on phone: Not on file    Gets together: Not on file    Attends religious service: Not on file    Active member of club or organization: Not on file  Attends meetings of clubs or organizations: Not on file    Relationship status: Not on file  Other Topics Concern  . Not on file  Social History Narrative   Patient lives at home alone.    Patient has no children.    Patient has her masters in nursing.    Patient is single.    Patient drinks about 2 glasses of tea daily.   Patient is right handed.    Allergies:  Allergies  Allergen Reactions  . Amoxicillin Anaphylaxis and Other (See Comments)    Has patient had a PCN reaction causing immediate rash, facial/tongue/throat swelling, SOB or lightheadedness with hypotension: Yes Has patient had a PCN reaction causing severe rash involving mucus membranes or skin necrosis: Yes Has patient had a PCN reaction that required hospitalization: Yes Has patient had a PCN reaction occurring within the last 10 years: Yes If all of the above answers are "NO", then may proceed with Cephalosporin use.   [name] Hydrocodone Anaphylaxis  . Percocet [Oxycodone-Acetaminophen] Other (See Comments)    Causes chest pain /tightness. Pressure pain around her ribs.  . Vancomycin Itching  . Depacon [Valproic Acid] Other (See Comments)    Causes falls     Metabolic Disorder Labs: Lab Results  Component Value Date   HGBA1C 6.8 (H) 03/05/2019   MPG 148 03/05/2019   MPG 136.98 10/30/2018   No results found for: PROLACTIN Lab Results  Component Value Date   CHOL 176 04/24/2018   TRIG 114 04/24/2018   HDL 59 04/24/2018   CHOLHDL 3.4  07/10/2016   VLDL 31 (H) 07/10/2016   LDLCALC 96 04/24/2018   LDLCALC 83 07/10/2016   Lab Results  Component Value Date   TSH 1.49 03/05/2019   TSH 2.671 10/30/2018    Therapeutic Level Labs: No results found for: LITHIUM No results found for: VALPROATE No components found for:  CBMZ  Current Medications: Current Outpatient Medications  Medication Sig Dispense Refill  . aspirin EC 81 MG tablet Take 1 tablet (81 mg total) by mouth daily. 90 tablet 3  . baclofen (LIORESAL) 10 MG tablet TAKE ONE TABLET BY MOUTH AT BEDTIME. 30 tablet 6  . buPROPion (WELLBUTRIN XL) 300 MG 24 hr tablet Take 1 tablet (300 mg total) by mouth every morning. 30 tablet 2  . calcium carbonate (TUMS - DOSED IN MG ELEMENTAL CALCIUM) 500 MG chewable tablet Chew 2 tablets by mouth as needed for indigestion or heartburn.     . carvedilol (COREG) 6.25 MG tablet TAKE ONE TABLET BY MOUTH BID(MORNING ,EVENING) 180 tablet 3  . diclofenac Sodium (VOLTAREN) 1 % GEL Apply topically 4 (four) times daily.    [name] EPINEPHrine (EPIPEN 2-PAK) 0.3 mg/0.3 mL IJ SOAJ injection Inject 0.3 mg into the muscle once.    [name] esomeprazole (NEXIUM) 40 MG capsule Take 40 mg by mouth at bedtime.   12  . gabapentin (NEURONTIN) 300 MG capsule Take 1 capsule (300 mg total) by mouth 2 (two) times daily.    [name] glipiZIDE (GLUCOTROL XL) 5 MG 24 hr tablet Take 1 tablet (5 mg total) by mouth daily with breakfast. 30 tablet 3  . hydrOXYzine (ATARAX/VISTARIL) 10 MG tablet Take 1 tablet (10 mg total) by mouth 3 (three) times daily as needed for itching. 30 tablet 0  . Lactase (LACTAID FAST ACT) 9000 units TABS Take by mouth. Patient states that she takes as needed.    . lamoTRIgine (LAMICTAL) 150 MG tablet Take 1 tablet (150 mg total)  by mouth at bedtime. 30 tablet 2  . levothyroxine (SYNTHROID, LEVOTHROID) 100 MCG tablet Take 100 mcg by mouth daily before breakfast.     . losartan (COZAAR) 25 MG tablet TAKE ONE TABLET BY MOUTH DAILY (MORNING) 90 tablet 1  .  meclizine (ANTIVERT) 25 MG tablet Take 1 tablet (25 mg total) by mouth 3 (three) times daily as needed for dizziness. (Patient taking differently: Take 25 mg by mouth daily as needed for dizziness. ) 30 tablet 0  . metFORMIN (GLUCOPHAGE) 500 MG tablet Take 1 tablet (500 mg total) by mouth 2 (two) times daily with a meal. 180 tablet 0  . ondansetron (ZOFRAN) 4 MG tablet Take 1 tablet (4 mg total) by mouth 2 (two) times daily as needed for nausea or vomiting. 30 tablet 1  . potassium chloride SA (KLOR-CON) 20 MEQ tablet Take 1 tablet (20 mEq total) by mouth daily as needed (when taking torsemide). 3 x week 30 tablet 2  . rOPINIRole (REQUIP) 2 MG tablet Take 2 mg by mouth 3 (three) times daily. .    . temazepam (RESTORIL) 15 MG capsule Take 1 capsule (15 mg total) by mouth at bedtime as needed for sleep. 30 capsule 2  . torsemide (DEMADEX) 20 MG tablet Take 1 tablet (20 mg total) by mouth daily as needed (for weight greater than 234 lbs). 30 tablet 2  . traMADol (ULTRAM) 50 MG tablet Take 100 mg by mouth 4 (four) times daily as needed.     . venlafaxine XR (EFFEXOR-XR) 150 MG 24 hr capsule TAKE TWO (2) CAPSULES BY MOUTH AT BEDTIME. 60 capsule 2   No current facility-administered medications for this visit.      Musculoskeletal: Strength & Muscle Tone: decreased Gait & Station: normal Patient leans: N/A  Psychiatric Specialty Exam: Review of Systems  Constitutional: Positive for malaise/fatigue.  Musculoskeletal: Positive for joint pain.  Neurological: Positive for weakness.  All other systems reviewed and are negative.   There were no vitals taken for this visit.There is no height or weight on file to calculate BMI.  General Appearance: NA  Eye Contact:  NA  Speech:  Clear and Coherent  Volume:  Decreased  Mood:  Euthymic  Affect:  NA  Thought Process:  Goal Directed  Orientation:  Full (Time, Place, and Person)  Thought Content: Rumination   Suicidal Thoughts:  No  Homicidal  Thoughts:  No  Memory:  Immediate;   Good Recent;   Good Remote;   Fair  Judgement:  Fair  Insight:  Fair  Psychomotor Activity:  Decreased  Concentration:  Concentration: Fair and Attention Span: Fair  Recall:  Good  Fund of Knowledge: Fair  Language: Good  Akathisia:  No  Handed:  Right  AIMS (if indicated): not done  Assets:  Communication Skills Desire for Improvement Resilience Social Support Talents/Skills  ADL's:  Intact  Cognition: WNL  Sleep:  Good   Screenings: Mini-Mental     Office Visit from 02/18/2018 in [facility] Office Visit from 09/17/2017 in [facility]  Total Score (max 30 points )  28  28    PHQ2-9     Office Visit from 09/11/2017 in [facility] Office Visit from 04/22/2017 in [facility] Office Visit from 12/06/2016 in [facility] Office Visit from 07/16/2016 in [facility] Nutrition from 04/02/2016 in [facility]  PHQ-2 Total Score  0  0  0  0  4  PHQ-9 Total  Score  -  -  -  -  13       Assessment and Plan: This patient is a 68 year old female with a history of depression anxiety and mood swings.  She seems to be doing somewhat better now that she is sleeping more but she is somewhat drowsy during the day.  She will continue Effexor XR 300 mg to take all at bedtime, continue Wellbutrin XL 300 mg daily for depression and try moving this to bedtime as well as Lamictal 150 mg daily for mood swings at bedtime.  She will continue temazepam 15 mg at bedtime for sleep.  She will return to see me in 3 months   [name], MD 03/08/2019, 1:42 PM

## 2019-03-17 ENCOUNTER — Other Ambulatory Visit: Payer: Self-pay

## 2019-03-17 ENCOUNTER — Ambulatory Visit: Payer: Medicare Other | Admitting: Allergy & Immunology

## 2019-03-17 DIAGNOSIS — Z20822 Contact with and (suspected) exposure to covid-19: Secondary | ICD-10-CM

## 2019-03-17 DIAGNOSIS — Z20828 Contact with and (suspected) exposure to other viral communicable diseases: Secondary | ICD-10-CM | POA: Diagnosis not present

## 2019-03-18 ENCOUNTER — Other Ambulatory Visit: Payer: Self-pay | Admitting: "Endocrinology

## 2019-03-18 ENCOUNTER — Other Ambulatory Visit: Payer: Self-pay

## 2019-03-18 ENCOUNTER — Ambulatory Visit (INDEPENDENT_AMBULATORY_CARE_PROVIDER_SITE_OTHER): Payer: Medicare Other | Admitting: Psychiatry

## 2019-03-18 DIAGNOSIS — F331 Major depressive disorder, recurrent, moderate: Secondary | ICD-10-CM | POA: Diagnosis not present

## 2019-03-18 NOTE — Progress Notes (Signed)
Virtual Visit via Telephone Note  I connected with Norma [name] on 03/18/19 at 1:10 PM EST by telephone and verified that I am speaking with the correct person using two identifiers.   I discussed the limitations, risks, security and privacy concerns of performing an evaluation and management service by telephone and the availability of in person appointments. I also discussed with the patient that there may be a patient responsible charge related to this service. The patient expressed understanding and agreed to proceed.  I provided 50  minutes of non-face-to-face time during this encounter.   [name], LCSW    THERAPIST PROGRESS NOTE  Session Time: Thursday 03/18/2019 1:10 PM - 2:00 PM  Participation Level: Active  Behavioral Response: AlertDepressed  Type of Therapy: Individual Therapy  Treatment Goals addressed: Establish rapport, learn and implement cognitive and behavioral strategies to overcome depression  Interventions: CBT and Supportive  Summary: Norma [name] is a 68 y.o. female who is referred for services by psychiatrist Dr. Harrington Challenger  due to experiencing symptoms of anxiety and depression. 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  She reports at least 3 psychiatric hospitlaizations with the last one occuring several years ago at [facility]. She is a returning patient to this writer/clinician as she was seen once in 2017. She reports problems with insurance coverage prevented her from returning to treatment sooner. She states being more depressed and just not being as calm as she used to be. 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 She reports ruminating about the past as she and the owner of the facility used to be friends but had conflict that led to patient leaving her job. She reports financial stress due to several bills. She also reports stress regarding her nephews' dogs as they have damaged her home.    Patient last was seen about 2 weeks ago via virtual visit.  She reports feeling a little better since last session.  She says her sense of humor has returned and she is doing more things such as washing dishes and cleaning her bathroom.  She reports increased energy.  She continues to worry about finances as she states it takes every penny to pay her bills.  She has engaged in problem solving and discontinued paying her nephew's phone bills to try to reduce her expenses.  She reports continued rumination about the past especially regarding incidents at her last job that led to her retirement.  She continues to miss her patients, her work, and most of all, the relationship with the owner the facility where she worked.  She expresses sadness, hurt, anger and frustration regarding the loss of her job.  Reports continued depressed mood and pattern of negative thinking especially about what others may be thinking or saying.  Suicidal/Homicidal: Nowithout intent/plan  Therapist Response: Established rapport, reviewed symptoms, praised and reinforced patient's increased behavioral activation and efforts regarding problem solving, discussed stressors, facilitated expression of thoughts and feelings, validated feelings, developed treatment plan, obtained patient's permission to initial plan as this was a virtual visit, began to discuss behavioral targets, facilitated patient sharing the narrative of the  loss of her job, began to assist patient identify the connection between loss and depression  Plan: Return again in 2 weeks.  Diagnosis: Axis I: MDD, moderate    Axis II: Deferred    [name],  LCSW 03/18/2019

## 2019-03-20 LAB — NOVEL CORONAVIRUS, NAA: SARS-CoV-2, NAA: NOT DETECTED

## 2019-03-22 ENCOUNTER — Ambulatory Visit: Payer: Medicare Other | Admitting: Neurology

## 2019-03-24 ENCOUNTER — Ambulatory Visit: Payer: Medicare Other | Admitting: Orthopaedic Surgery

## 2019-03-24 ENCOUNTER — Telehealth: Payer: Self-pay | Admitting: *Deleted

## 2019-03-24 DIAGNOSIS — E782 Mixed hyperlipidemia: Secondary | ICD-10-CM

## 2019-03-24 NOTE — Telephone Encounter (Signed)
LM to return call.

## 2019-03-24 NOTE — Telephone Encounter (Signed)
-----   Message from Arnoldo Lenis, MD sent at 03/22/2019 12:29 PM EST ----- Cholesterol too high, needs referal to lipid clinic to consider pcsk9 inhibitor  Zandra Abts MD

## 2019-03-24 NOTE — Telephone Encounter (Signed)
Pt agreeable to referral will place orders - routed to pcp

## 2019-03-25 ENCOUNTER — Ambulatory Visit: Payer: Medicare Other | Admitting: Orthopaedic Surgery

## 2019-03-25 DIAGNOSIS — I2510 Atherosclerotic heart disease of native coronary artery without angina pectoris: Secondary | ICD-10-CM | POA: Diagnosis not present

## 2019-03-25 DIAGNOSIS — E1169 Type 2 diabetes mellitus with other specified complication: Secondary | ICD-10-CM | POA: Diagnosis not present

## 2019-03-25 DIAGNOSIS — J449 Chronic obstructive pulmonary disease, unspecified: Secondary | ICD-10-CM | POA: Diagnosis not present

## 2019-03-25 DIAGNOSIS — K219 Gastro-esophageal reflux disease without esophagitis: Secondary | ICD-10-CM | POA: Diagnosis not present

## 2019-03-31 ENCOUNTER — Ambulatory Visit (INDEPENDENT_AMBULATORY_CARE_PROVIDER_SITE_OTHER): Payer: Medicare Other | Admitting: Orthopaedic Surgery

## 2019-03-31 ENCOUNTER — Other Ambulatory Visit: Payer: Self-pay

## 2019-03-31 ENCOUNTER — Encounter: Payer: Self-pay | Admitting: Orthopaedic Surgery

## 2019-03-31 VITALS — Ht 62.0 in | Wt 238.0 lb

## 2019-03-31 DIAGNOSIS — I2510 Atherosclerotic heart disease of native coronary artery without angina pectoris: Secondary | ICD-10-CM

## 2019-03-31 DIAGNOSIS — M170 Bilateral primary osteoarthritis of knee: Secondary | ICD-10-CM | POA: Diagnosis not present

## 2019-03-31 MED ORDER — LIDOCAINE HCL 1 % IJ SOLN
2.0000 mL | INTRAMUSCULAR | Status: AC | PRN
  Administered 2019-03-31: 2 mL

## 2019-03-31 MED ORDER — METHYLPREDNISOLONE ACETATE 40 MG/ML IJ SUSP
80.0000 mg | INTRAMUSCULAR | Status: AC | PRN
  Administered 2019-03-31: 80 mg via INTRA_ARTICULAR

## 2019-03-31 MED ORDER — BUPIVACAINE HCL 0.5 % IJ SOLN
2.0000 mL | INTRAMUSCULAR | Status: AC | PRN
  Administered 2019-03-31: 2 mL via INTRA_ARTICULAR

## 2019-03-31 NOTE — Progress Notes (Signed)
Office Visit Note   Patient: Norma [name]           Date of Birth: 09-03-1950           MRN: [id number] Visit Date: 03/31/2019              Requested by: Sinda Du, MD [address] [place],  [place] [address] PCP: Sinda Du, MD   Assessment & Plan: Visit Diagnoses:  1. Bilateral primary osteoarthritis of knee     Plan: Stage osteoarthritis both knees.  Long discussion regarding her weight and definitive treatment for her knee.  Her BMI still remains well above 42.  Will inject the left knee with cortisone.  We have discussed viscosupplementation but she wanted to hold off because she is really not sure it made much of a difference on the right.  Follow-Up Instructions: Return if symptoms worsen or fail to improve.   Orders:  Orders Placed This Encounter  Procedures  . Large Joint Inj: L knee   No orders of the defined types were placed in this encounter.     Procedures: Large Joint Inj: L knee on 03/31/2019 2:05 PM Indications: pain and diagnostic evaluation Details: 25 G 1.5 in needle, anterolateral approach  Arthrogram: No  Medications: 2 mL lidocaine 1 %; 2 mL bupivacaine 0.5 %; 80 mg methylPREDNISolone acetate 40 MG/ML Procedure, treatment alternatives, risks and benefits explained, specific risks discussed. Consent was given by the patient. Patient was prepped and draped in the usual sterile fashion.       Clinical Data: No additional findings.   Subjective: Chief Complaint  Patient presents with  . Left Knee - Pain  Patient presents today for left knee pain. She is unable to walk far and feels like her leg is stiff. She uses Voltaren gel and takes Tramadol or Tylenol as needed. She gets a little relief but would like something stronger to take. She states that she is allergic to most pain medicines. She just finished Orthovisc injection on the right side. Her last cortisone injection on the left side was on 01/20/2019.  HPI  Review of  Systems   Objective: Vital Signs: Ht 5\' 2"  (1.575 m)   Wt 238 lb (108 kg)   BMI 43.53 kg/m   Physical Exam Constitutional:      Appearance: She is well-developed.  Eyes:     Pupils: Pupils are equal, round, and reactive to light.  Pulmonary:     Effort: Pulmonary effort is normal.  Skin:    General: Skin is warm and dry.  Neurological:     Mental Status: She is alert and oriented to person, place, and time.  Psychiatric:        Behavior: Behavior normal.     Ortho Exam awake alert and oriented x3.  Comfortable sitting.  BMI was close to 43.  His knees.  The left knee was more symptomatic on the right.  Having a bit more lateral and medial joint pain today.  Has a lot of scratches on her knee from her pets.  No obvious infection.  Full extension about 90 degrees of flexion with calf with touch thigh.  No instability. Specialty Comments:  No specialty comments available.  Imaging: No results found.   PMFS History: Patient Active Problem List   Diagnosis Date Noted  . Unilateral primary osteoarthritis, right knee 02/18/2019  . Bilateral primary osteoarthritis of knee 01/20/2019  . Diabetic neuropathy ([place]) 09/16/2018  . Unilateral primary osteoarthritis, left knee  09/16/2018  . Class 3 severe obesity due to excess calories with serious comorbidity and body mass index (BMI) of 45.0 to 49.9 in adult ([place]) 02/20/2018  . Mixed hyperlipidemia 09/11/2017  . Pressure injury of skin 09/02/2017  . Coronary artery disease involving native heart without angina pectoris   . S/P CABG x 3   . Leukocytosis   . Acute blood loss anemia   . Post-operative pain   . Bipolar affective disorder ([place])   . Diabetes mellitus type 2 in obese ([place])   . History of CVA (cerebrovascular accident)   . Hypokalemia   . Hx of CABG 08/29/2017  . Coronary artery disease involving native coronary artery of native heart with unstable angina pectoris ([place]) 08/28/2017  . Unstable angina ([place]) 08/27/2017   . Cardiomyopathy ([place]) 08/22/2017  . Stroke (cerebrum) ([place]) 05/16/2017  . Low hemoglobin 09/19/2016  . Dizziness 06/21/2016  . Chest pain with moderate risk for cardiac etiology 04/23/2016  . Bipolar I disorder, most recent episode depressed ([place]) 06/05/2015  . DM type 2 causing vascular disease ([place]) 09/20/2014  . Chronic bilateral low back pain with bilateral sciatica 09/20/2014  . Morbid obesity, unspecified obesity type ([place]) 09/20/2014  . Memory difficulty 09/20/2014  . Lumbosacral spondylosis without myelopathy 12/25/2012  . Gastroparesis 11/04/2012  . Nausea with vomiting 11/04/2012  . Restless legs syndrome (RLS) 09/17/2012  . Headache(784.0) 03/19/2012  . GERD (gastroesophageal reflux disease) 02/16/2012  . Abdominal pain 02/16/2012  . Hypothyroidism 02/16/2012  . Tremor 02/16/2012  . Hyperlipidemia associated with type 2 diabetes mellitus ([place]) 04/01/2008  . Obstructive sleep apnea 04/01/2008  . Essential hypertension, benign 04/01/2008  . LBBB (left bundle branch block) 04/01/2008   Past Medical History:  Diagnosis Date  . Anemia   . Anxiety   . Bipolar disorder ([place])   . Bulging lumbar disc    L3-4  . Chronic daily headache   . Chronic low back pain 09/20/2014  . COPD (chronic obstructive pulmonary disease) ([place])   . Degenerative arthritis   . Depression   . Diabetes mellitus, type II ([place])   . Diabetic neuropathy ([place]) 09/16/2018  . DM type 2 with diabetic peripheral neuropathy ([place]) 09/20/2014  . Dyslipidemia   . Dyspnea   . Gastroparesis   . GERD (gastroesophageal reflux disease)   . Heart murmur   . History of hiatal hernia   . Hypothyroidism   . IBS (irritable bowel syndrome)   . Memory difficulty 09/20/2014  . Morbid obesity ([place])   . Neuropathy   . Obstructive sleep apnea on CPAP   . Restless legs syndrome (RLS) 09/17/2012  . Stroke (cerebrum) ([place]) 05/16/2017   Left parietal    Family History  Problem Relation Age of Onset  . Hypertension Mother   .  Lymphoma Mother   . Depression Mother   . Arthritis Father   . Alcohol abuse Father   . Hypertension Sister   . Cancer Brother        kidney and lung  . Alcohol abuse Brother   . Alcohol abuse Paternal Uncle   . Alcohol abuse Paternal Grandfather   . Alcohol abuse Paternal Grandmother     Past Surgical History:  Procedure Laterality Date  . ABDOMINAL HYSTERECTOMY    . ARTHROSCOPY KNEE W/ DRILLING  06/2011   and Decemer of 2013.  [name] Carpel tunnel  1980's  . CATARACT EXTRACTION W/PHACO Right 03/21/2017   Procedure: CATARACT EXTRACTION PHACO AND INTRAOCULAR LENS PLACEMENT RIGHT EYE;  Surgeon:  Baruch Goldmann, MD;  Location: [facility];  Service: Ophthalmology;  Laterality: Right;  CDE: 2.91   . CATARACT EXTRACTION W/PHACO Left 04/18/2017   Procedure: CATARACT EXTRACTION PHACO AND INTRAOCULAR LENS PLACEMENT LEFT EYE;  Surgeon: Baruch Goldmann, MD;  Location: [facility];  Service: Ophthalmology;  Laterality: Left;  left  . CHOLECYSTECTOMY    . COLONOSCOPY WITH PROPOFOL N/A 10/04/2016   Procedure: COLONOSCOPY WITH PROPOFOL;  Surgeon: Rogene Houston, MD;  [facility];  Service: Endoscopy;  Laterality: N/A;  11:10  . CORONARY ARTERY BYPASS GRAFT N/A 08/29/2017   Procedure: CORONARY ARTERY BYPASS GRAFTING (CABG) x 3 WITH ENDOSCOPIC HARVESTING OF RIGHT SAPHENOUS VEIN;  Surgeon: Ivin Poot, MD;  Location: [facility];  Service: Open Heart Surgery;  Laterality: N/A;  . ESOPHAGOGASTRODUODENOSCOPY N/A 04/25/2016   Procedure: ESOPHAGOGASTRODUODENOSCOPY (EGD);  Surgeon: Rogene Houston, MD;  [facility];  Service: Endoscopy;  Laterality: N/A;  730  . LEFT HEART CATH AND CORONARY ANGIOGRAPHY N/A 08/27/2017   Procedure: LEFT HEART CATH AND CORONARY ANGIOGRAPHY;  Surgeon: Sherren Mocha, MD;  Location: [facility];  Service: Cardiovascular::  pLAD 95% - p-mLAD 50%, ostD1 90% -pD1 80%; ostOM1 90%; rPDA 80%. EF ~50-55% - HK of dital Anterolateral & Apical wall.  - Rec CVTS c/s  . RECTAL  SURGERY     fissure  . SHOULDER SURGERY Left    arthroscopy in March of this year  . TEE WITHOUT CARDIOVERSION N/A 08/29/2017   Procedure: TRANSESOPHAGEAL ECHOCARDIOGRAM (TEE);  Surgeon: Prescott Gum, Collier Salina, MD;  Location: [facility];  Service: Open Heart Surgery;  Laterality: N/A;  . TRANSTHORACIC ECHOCARDIOGRAM  06/06/2017   Mild to moderate reduced EF 40 and 45%.  Anterior septal, inferoseptal and basal to mid inferior hypokinesis.  GR 1 DD.  No significant valvular lesion   Social History   Occupational History    Employer: [employer]  Tobacco Use  . Smoking status: Former Smoker    Types: Cigarettes    Quit date: 02/04/1971    Years since quitting: 48.1  . Smokeless tobacco: Never Used  . Tobacco comment: smoked 2 cigarettes a day  Substance and Sexual Activity  . Alcohol use: No    Alcohol/week: 0.0 standard drinks  . Drug use: No  . Sexual activity: Never

## 2019-04-01 ENCOUNTER — Telehealth: Payer: Self-pay | Admitting: Cardiology

## 2019-04-01 ENCOUNTER — Ambulatory Visit (INDEPENDENT_AMBULATORY_CARE_PROVIDER_SITE_OTHER): Payer: Medicare Other | Admitting: Psychiatry

## 2019-04-01 DIAGNOSIS — F331 Major depressive disorder, recurrent, moderate: Secondary | ICD-10-CM | POA: Diagnosis not present

## 2019-04-01 NOTE — Telephone Encounter (Signed)
asking if Dr Domenic Polite would sign off on a form stating she is disabled

## 2019-04-01 NOTE — Telephone Encounter (Signed)
Left message to return call 

## 2019-04-01 NOTE — Progress Notes (Signed)
Virtual Visit via Telephone Note  I connected with Norma [name] on 04/01/19 at 2:25 PM ESTby telephone and verified that I am speaking with the correct person using two identifiers.   I discussed the limitations, risks, security and privacy concerns of performing an evaluation and management service by telephone and the availability of in person appointments. I also discussed with the patient that there may be a patient responsible charge related to this service. The patient expressed understanding and agreed to proceed.  I provided 30 minutes of non-face-to-face time during this encounter.   [name], LCSW     THERAPIST PROGRESS NOTE  Session Time: Thursday 04/01/2019 2:25 PM - 2:55 PM  Participation Level: Active  Behavioral Response: AlertDepressed  Type of Therapy: Individual Therapy  Treatment Goals addressed: learn and implement cognitive and behavioral strategies to overcome depression  Interventions: CBT and Supportive  Summary: Norma [name] is a 68 y.o. female who is referred for services by psychiatrist Dr. Harrington Challenger  due to experiencing symptoms of anxiety and depression. 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  She reports at least 3 psychiatric hospitlaizations with the last one occuring several years ago at [facility]. She is a returning patient to this writer/clinician as she was seen once in 2017. She reports problems with insurance coverage prevented her from returning to treatment sooner. She states being more depressed and just not being as calm as she used to be. 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 She reports ruminating about the past as she and the owner of the facility used to be friends but had conflict that led to patient leaving her job. She reports financial stress due to several bills. She also reports stress regarding her nephews' dogs as they have damaged her home.    Patient last was seen about 2 weeks ago via virtual visit.  She states having ups and downs mainly triggered by concerns about her health issues.  Per patient's report, she needs both knees replaced.  She was told by her doctor he could not perform surgery until she lost 30 pounds.  She reports she has lost 30 pounds but saw her doctor recently and he informed her she needs to lose 30 more pounds before she can have the surgery.  She reports becoming very sad and having thoughts of hopelessness regarding future weight loss and her ability to manage her pain.  She also reports stress regarding her 69 year old nephew who resides with her and has 2 dogs.  She expresses frustration that he does not help clean up after the dogs and complains that she is at home all day and should be the one to clean up after the dogs.   Suicidal/Homicidal: Nowithout intent/plan  Therapist Response: reviewed symptoms, discussed stressors, facilitated expression of thoughts and feelings, validated feelings, assisted patient began to examine her thought patterns and the effects on her mood and behavior, help patient explore ways she could possibly increase physical activity to pursue goal of weight loss to have surgery, develop plan with patient to contact the [facility] regarding participation in water exercise program, assisted patient  examine her pattern of interaction with her nephew, began to discuss boundary issues, assisted patient identify her options regarding responsibilities for taking care of the dogs and the pros/cons of each   Plan: Return again in 2 -3 weeks.  Diagnosis: Axis I: MDD, moderate    Axis II: Deferred    [name], LCSW 04/01/2019

## 2019-04-12 NOTE — Telephone Encounter (Signed)
Advised her to bring this form with her to her next office visit in February - he can make that determination at this office visit. She verbalized understanding

## 2019-04-20 ENCOUNTER — Other Ambulatory Visit: Payer: Self-pay | Admitting: Cardiology

## 2019-04-22 ENCOUNTER — Other Ambulatory Visit: Payer: Self-pay

## 2019-04-22 ENCOUNTER — Ambulatory Visit (INDEPENDENT_AMBULATORY_CARE_PROVIDER_SITE_OTHER): Payer: Medicare Other | Admitting: Psychiatry

## 2019-04-22 DIAGNOSIS — F331 Major depressive disorder, recurrent, moderate: Secondary | ICD-10-CM

## 2019-04-22 NOTE — Progress Notes (Signed)
Virtual Visit via Telephone Note  I connected with Norma [name] on 04/22/19 at 3:15 PM  by telephone and verified that I am speaking with the correct person using two identifiers.   I discussed the limitations, risks, security and privacy concerns of performing an evaluation and management service by telephone and the availability of in person appointments. I also discussed with the patient that there may be a patient responsible charge related to this service. The patient expressed understanding and agreed to proceed.  I provided 50 minutes of non-face-to-face time during this encounter.   [name], LCSW     THERAPIST PROGRESS NOTE  Session Time: Thursday 04/22/2019 3:15 PM - 4:00 PM       Participation Level: Active  Behavioral Response: Alert/euthymic  Type of Therapy: Individual Therapy  Treatment Goals addressed: learn and implement cognitive and behavioral strategies to overcome depression  Interventions: CBT and Supportive  Summary: Norma [name] is a 69 y.o. female who is referred for services by psychiatrist Dr. Harrington Challenger  due to experiencing symptoms of anxiety and depression. 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  She reports at least 3 psychiatric hospitlaizations with the last one occuring several years ago at [facility]. She is a returning patient to this writer/clinician as she was seen once in 2017. She reports problems with insurance coverage prevented her from returning to treatment sooner. She states being more depressed and just not being as calm as she used to be. 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 She reports ruminating about the past as she and the owner of the facility used to be friends but had conflict that led to patient leaving her job. She reports financial stress due to several bills. She also reports stress regarding her nephews' dogs as they have damaged her home.    Patient last was seen about 2 - 3 weeks ago via virtual visit.  She reports she and her nephew both contracted Covid-19 during the holidays.  She has recovered and reports her nephew still is experiencing symptoms but has been residing with his father.  Patient reports decreased stress since he has not been staying in her home and anticipates he will start to spend majority of his time in the future at his father's home.  Patient reports being okay with this.  She reports increased thoughts about loss of her job and loss of her friendship in recent weeks and reports increased acceptance of these losses.  She reports feeling better physically as she received cortisone shots in her knees and some of her pain has been alleviated.  She reports decreased worry about her weight and decreased frustration about not having surgery.  She expresses less worry and frustration about caring for her nephew's dogs as she has boarded one in a kennel. patient reports much improved mood and is pleased with her progress.    Suicidal/Homicidal: Nowithout intent/plan  Therapist Response: reviewed symptoms, discussed stressors and losses, facilitated expression of thoughts and feelings, validated feelings, began to discuss lapse versus relapse of depression, discussed termination and develop stepdown plan to include 1-2 more sessions  focused on learning and implementing relapse prevention strategies, therapist will send patient identify an early warning signs of depression handout in preparation for next session assisted patient began to examine her thought patterns and the effects on her mood and behavior, help patient explore ways she could  possibly increase physical activity to pursue goal of weight loss to have surgery, develop plan with patient to contact the [facility] regarding participation in water exercise program, assisted patient examine her pattern of interaction with her nephew, began to discuss boundary issues, assisted patient identify her options regarding responsibilities for taking care of the dogs and the pros/cons of each   Plan: Return again in 3-4 weeks.  Diagnosis: Axis I: MDD, moderate    Axis II: Deferred    [name], LCSW 04/22/2019

## 2019-04-23 ENCOUNTER — Ambulatory Visit (INDEPENDENT_AMBULATORY_CARE_PROVIDER_SITE_OTHER): Payer: Medicare Other | Admitting: Allergy & Immunology

## 2019-04-23 ENCOUNTER — Other Ambulatory Visit: Payer: Self-pay

## 2019-04-23 ENCOUNTER — Encounter: Payer: Self-pay | Admitting: Allergy & Immunology

## 2019-04-23 VITALS — BP 142/86 | HR 100 | Temp 97.3°F | Resp 20 | Ht 61.5 in | Wt 242.6 lb

## 2019-04-23 DIAGNOSIS — L508 Other urticaria: Secondary | ICD-10-CM | POA: Diagnosis not present

## 2019-04-23 DIAGNOSIS — T782XXD Anaphylactic shock, unspecified, subsequent encounter: Secondary | ICD-10-CM | POA: Diagnosis not present

## 2019-04-23 NOTE — Progress Notes (Signed)
NEW PATIENT  Date of Service/Encounter:  04/23/19  Referring provider: Sinda Du, MD   Assessment:   Chronic urticaria  Anaphylaxis - possible food allergy  Plan/Recommendations:   1. Chronic urticaria - Your history does not have any "red flags" such as fevers, joint pains, or permanent skin changes that would be concerning for a more serious cause of hives.  - We will get some labs to rule out serious causes of hives: environmental allergy panel, alpha gal panel, complete blood count, tryptase level, chronic urticaria panel, CMP, ESR, and CRP. - Chronic hives are often times a self limited process and will "burn themselves out" over 6-12 months, although this is not always the case.  - In the meantime, start suppressive dosing of antihistamines:   - Morning: Zyrtec (cetirizine) 43m (one tablet)  - Evening: Zyrtec (cetirizine) 135m(one tablet) + Pepcid (famotidine) 2060m You can change this dosing at home, decreasing the dose as needed or increasing the dosing as needed.  - If you are not tolerating the medications or are tired of taking them every day, we can start treatment with a monthly injectable medication called Xolair.   2. Anaphylaxis - EpiPen is up to date. - Anaphylaxis Management Plan.  - We are going to get the panel with the most common foods. - We are ordering labs, so please allow 1-2 weeks for the results to come back. - With the newly implemented Cures Act, the labs might be visible to you at the same time that they become visible to me. - However, I will not address the results until all of the results come  back, so please be patient.   3. Return in about 6 weeks (around 06/04/2019). This can be an in-person, a virtual Webex or a telephone follow up visit.  Subjective:   DebSADEEL FIDDLER a 68 61o. female presenting today for evaluation of  Chief Complaint  Patient presents with  . Pruritis    At night. Arms, legs and back.   . Rash   Inner thighs and arms. Presents at night.     DebJones Skenes a history of the following: Patient Active Problem List   Diagnosis Date Noted  . Unilateral primary osteoarthritis, right knee 02/18/2019  . Bilateral primary osteoarthritis of knee 01/20/2019  . Diabetic neuropathy (HCCMiamitown6/06/2018  . Unilateral primary osteoarthritis, left knee 09/16/2018  . Class 3 severe obesity due to excess calories with serious comorbidity and body mass index (BMI) of 45.0 to 49.9 in adult (HCCAnniston1/11/2017  . Mixed hyperlipidemia 09/11/2017  . Pressure injury of skin 09/02/2017  . Coronary artery disease involving native heart without angina pectoris   . S/P CABG x 3   . Leukocytosis   . Acute blood loss anemia   . Post-operative pain   . Bipolar affective disorder (HCC[place]isle[place]Rock[place] . Diabetes mellitus type 2 in obese (HCCChilili . History of CVA (cerebrovascular accident)   . Hypokalemia   . Hx of CABG 08/29/2017  . Coronary artery disease involving native coronary artery of native heart with unstable angina pectoris (HCCNewton5/16/2019  . Unstable angina (HCCSouth Webster5/15/2019  . Cardiomyopathy (HCCSausalito5/01/2018  . Stroke (cerebrum) (HCCGarrett2/04/2017  . Low hemoglobin 09/19/2016  . Dizziness 06/21/2016  . Chest pain with moderate risk for cardiac etiology 04/23/2016  . Bipolar I disorder, most recent episode depressed (HCCLittle River2/20/2017  . DM type 2 causing vascular disease (HCCMacy6/10/2014  . Chronic bilateral  low back pain with bilateral sciatica 09/20/2014  . Morbid obesity, unspecified obesity type ([place]) 09/20/2014  . Memory difficulty 09/20/2014  . Lumbosacral spondylosis without myelopathy 12/25/2012  . Gastroparesis 11/04/2012  . Nausea with vomiting 11/04/2012  . Restless legs syndrome (RLS) 09/17/2012  . Headache(784.0) 03/19/2012  . GERD (gastroesophageal reflux disease) 02/16/2012  . Abdominal pain 02/16/2012  . Hypothyroidism 02/16/2012  . Tremor 02/16/2012  . Hyperlipidemia associated with type  2 diabetes mellitus ([place]) 04/01/2008  . Obstructive sleep apnea 04/01/2008  . Essential hypertension, benign 04/01/2008  . LBBB (left bundle branch block) 04/01/2008    History obtained from: chart review and patient.  [name] was referred by Sinda Du, MD.     Bettylou is a 69 y.o. female presenting for an evaluation of chronic urticaria and food allergies. She reports that she has itching over her thighs and arms. Sometimes, she has whelts on her arms. She has discoloration. She takes Benadryl but she was taking hydroxyzine. The Benadryl works better. She does feel better once she takes a bath. She uses a dial soap with oil in it that seems to work better. This rash occurs throughout the entire year. It first started around three months ago. The rash goes away fairly quickly without any problems at all.  She has always had cats, but she got one dog in January and one in June. That is the only change in her living environment. She did have dogs years ago. She flips her sheets every day (flipping around essentially). She is using Beandryl once per night. She will also use it early in the morning and have to repeat it. She has never any systemic symptoms with this.   She does have anaphylaxis to amoxicillin and hydrocodone. She was told that she was allergic to shellfish and tree nuts when she was tested by Dr. Ishmael Holter years ago. But she eats these without any issues at all. She does have an EpiPen that is up to date.   She has rare itchy watery eyes and runny nose and sneezing. She gets antibiotics around once per year at the most. She never has to go to the hospital for infection.   She does see Dr. [name] here in [place]. She is using triamcinolone for this seborrhea rash. She has not gone back to see him anytime recently.    Otherwise, there is no history of other atopic diseases, including asthma, drug allergies, stinging insect allergies, eczema or contact dermatitis. There is no  significant infectious history. Vaccinations are up to date.    Past Medical History: Patient Active Problem List   Diagnosis Date Noted  . Unilateral primary osteoarthritis, right knee 02/18/2019  . Bilateral primary osteoarthritis of knee 01/20/2019  . Diabetic neuropathy ([place]) 09/16/2018  . Unilateral primary osteoarthritis, left knee 09/16/2018  . Class 3 severe obesity due to excess calories with serious comorbidity and body mass index (BMI) of 45.0 to 49.9 in adult ([place]) 02/20/2018  . Mixed hyperlipidemia 09/11/2017  . Pressure injury of skin 09/02/2017  . Coronary artery disease involving native heart without angina pectoris   . S/P CABG x 3   . Leukocytosis   . Acute blood loss anemia   . Post-operative pain   . Bipolar affective disorder ([place])   . Diabetes mellitus type 2 in obese ([place])   . History of CVA (cerebrovascular accident)   . Hypokalemia   . Hx of CABG 08/29/2017  . Coronary artery disease involving native coronary artery  of native heart with unstable angina pectoris ([place]) 08/28/2017  . Unstable angina ([place]) 08/27/2017  . Cardiomyopathy ([place]) 08/22/2017  . Stroke (cerebrum) ([place]) 05/16/2017  . Low hemoglobin 09/19/2016  . Dizziness 06/21/2016  . Chest pain with moderate risk for cardiac etiology 04/23/2016  . Bipolar I disorder, most recent episode depressed ([place]) 06/05/2015  . DM type 2 causing vascular disease ([place]) 09/20/2014  . Chronic bilateral low back pain with bilateral sciatica 09/20/2014  . Morbid obesity, unspecified obesity type ([place]) 09/20/2014  . Memory difficulty 09/20/2014  . Lumbosacral spondylosis without myelopathy 12/25/2012  . Gastroparesis 11/04/2012  . Nausea with vomiting 11/04/2012  . Restless legs syndrome (RLS) 09/17/2012  . Headache(784.0) 03/19/2012  . GERD (gastroesophageal reflux disease) 02/16/2012  . Abdominal pain 02/16/2012  . Hypothyroidism 02/16/2012  . Tremor 02/16/2012  . Hyperlipidemia associated with type 2  diabetes mellitus ([place]) 04/01/2008  . Obstructive sleep apnea 04/01/2008  . Essential hypertension, benign 04/01/2008  . LBBB (left bundle branch block) 04/01/2008    Medication List:  Allergies as of 04/23/2019      Reactions   Amoxicillin Anaphylaxis, Other (See Comments)   Has patient had a PCN reaction causing immediate rash, facial/tongue/throat swelling, SOB or lightheadedness with hypotension: Yes Has patient had a PCN reaction causing severe rash involving mucus membranes or skin necrosis: Yes Has patient had a PCN reaction that required hospitalization: Yes Has patient had a PCN reaction occurring within the last 10 years: Yes If all of the above answers are "NO", then may proceed with Cephalosporin use.   Hydrocodone Anaphylaxis   Percocet [oxycodone-acetaminophen] Other (See Comments)   Causes chest pain /tightness. Pressure pain around her ribs.   Vancomycin Itching   Depacon [valproic Acid] Other (See Comments)   Causes falls       Medication List       Accurate as of April 23, 2019 11:59 PM. If you have any questions, ask your nurse or doctor.        aspirin EC 81 MG tablet Take 1 tablet (81 mg total) by mouth daily.   baclofen 10 MG tablet Commonly known as: LIORESAL TAKE ONE TABLET BY MOUTH AT BEDTIME.   buPROPion 300 MG 24 hr tablet Commonly known as: WELLBUTRIN XL Take 1 tablet (300 mg total) by mouth every morning.   calcium carbonate 500 MG chewable tablet Commonly known as: TUMS - dosed in mg elemental calcium Chew 2 tablets by mouth as needed for indigestion or heartburn.   carvedilol 6.25 MG tablet Commonly known as: COREG TAKE ONE TABLET BY MOUTH BID(MORNING ,EVENING)   cetirizine 10 MG tablet Commonly known as: ZYRTEC Take 1 tablet tablet 1-2 times daily. Started by: Valentina Shaggy, MD   EpiPen 2-Pak 0.3 mg/0.3 mL Soaj injection Generic drug: EPINEPHrine Inject 0.3 mg into the muscle once.   esomeprazole 40 MG capsule Commonly  known as: NEXIUM Take 40 mg by mouth at bedtime.   famotidine 20 MG tablet Commonly known as: Pepcid Take 1 tablet (20 mg total) by mouth daily. Started by: Valentina Shaggy, MD   gabapentin 300 MG capsule Commonly known as: NEURONTIN Take 1 capsule (300 mg total) by mouth 2 (two) times daily.   glipiZIDE 5 MG 24 hr tablet Commonly known as: GLUCOTROL XL Take 1 tablet (5 mg total) by mouth daily with breakfast.   hydrOXYzine 10 MG tablet Commonly known as: ATARAX/VISTARIL Take 1 tablet (10 mg total) by mouth 3 (three) times daily as needed for  itching.   Lactaid Fast Act 9000 units Tabs Generic drug: Lactase Take by mouth. Patient states that she takes as needed.   lamoTRIgine 150 MG tablet Commonly known as: LAMICTAL Take 1 tablet (150 mg total) by mouth at bedtime.   levothyroxine 100 MCG tablet Commonly known as: SYNTHROID Take 100 mcg by mouth daily before breakfast.   losartan 25 MG tablet Commonly known as: COZAAR TAKE ONE TABLET BY MOUTH DAILY (MORNING)   meclizine 25 MG tablet Commonly known as: ANTIVERT Take 1 tablet (25 mg total) by mouth 3 (three) times daily as needed for dizziness. What changed: when to take this   metFORMIN 500 MG tablet Commonly known as: GLUCOPHAGE TAKE ONE TABLET BY MOUTH TWICE DAILY. TAKE WITH A MEAL. (MORNING ,EVENING)   ondansetron 4 MG tablet Commonly known as: ZOFRAN Take 1 tablet (4 mg total) by mouth 2 (two) times daily as needed for nausea or vomiting.   potassium chloride SA 20 MEQ tablet Commonly known as: KLOR-CON Take 1 tablet (20 mEq total) by mouth daily as needed (when taking torsemide). 3 x week   rOPINIRole 2 MG tablet Commonly known as: REQUIP Take 2 mg by mouth 3 (three) times daily. .   temazepam 15 MG capsule Commonly known as: Restoril Take 1 capsule (15 mg total) by mouth at bedtime as needed for sleep.   torsemide 20 MG tablet Commonly known as: DEMADEX TAKE ONE TABLET BY MOUTH ALTERNATING  WITH TWO TABLETS daily (PER PT ONE DAILY-EXTRA IN BOTTLE)   traMADol 50 MG tablet Commonly known as: ULTRAM Take 100 mg by mouth 4 (four) times daily as needed.   venlafaxine XR 150 MG 24 hr capsule Commonly known as: EFFEXOR-XR TAKE TWO (2) CAPSULES BY MOUTH AT BEDTIME.   Voltaren 1 % Gel Generic drug: diclofenac Sodium Apply topically 4 (four) times daily.       Birth History: non-contributory  Developmental History: non-contributory  Past Surgical History: Past Surgical History:  Procedure Laterality Date  . ABDOMINAL HYSTERECTOMY    . ARTHROSCOPY KNEE W/ DRILLING  06/2011   and Decemer of 2013.  [name] Carpel tunnel  1980's  . CATARACT EXTRACTION W/PHACO Right 03/21/2017   Procedure: CATARACT EXTRACTION PHACO AND INTRAOCULAR LENS PLACEMENT RIGHT EYE;  Surgeon: Baruch Goldmann, MD;  Location: [facility];  Service: Ophthalmology;  Laterality: Right;  CDE: 2.91   . CATARACT EXTRACTION W/PHACO Left 04/18/2017   Procedure: CATARACT EXTRACTION PHACO AND INTRAOCULAR LENS PLACEMENT LEFT EYE;  Surgeon: Baruch Goldmann, MD;  Location: [facility];  Service: Ophthalmology;  Laterality: Left;  left  . CHOLECYSTECTOMY    . COLONOSCOPY WITH PROPOFOL N/A 10/04/2016   Procedure: COLONOSCOPY WITH PROPOFOL;  Surgeon: Rogene Houston, MD;  [facility];  Service: Endoscopy;  Laterality: N/A;  11:10  . CORONARY ARTERY BYPASS GRAFT N/A 08/29/2017   Procedure: CORONARY ARTERY BYPASS GRAFTING (CABG) x 3 WITH ENDOSCOPIC HARVESTING OF RIGHT SAPHENOUS VEIN;  Surgeon: Ivin Poot, MD;  Location: [facility];  Service: Open Heart Surgery;  Laterality: N/A;  . ESOPHAGOGASTRODUODENOSCOPY N/A 04/25/2016   Procedure: ESOPHAGOGASTRODUODENOSCOPY (EGD);  Surgeon: Rogene Houston, MD;  [facility];  Service: Endoscopy;  Laterality: N/A;  730  . LEFT HEART CATH AND CORONARY ANGIOGRAPHY N/A 08/27/2017   Procedure: LEFT HEART CATH AND CORONARY ANGIOGRAPHY;  Surgeon: Sherren Mocha, MD;  Location: [facility]  [facility];  Service: Cardiovascular::  pLAD 95% - p-mLAD 50%, ostD1 90% -pD1 80%; ostOM1 90%; rPDA 80%. EF ~  50-55% - HK of dital Anterolateral & Apical wall.  - Rec CVTS c/s  . RECTAL SURGERY     fissure  . SHOULDER SURGERY Left    arthroscopy in March of this year  . TEE WITHOUT CARDIOVERSION N/A 08/29/2017   Procedure: TRANSESOPHAGEAL ECHOCARDIOGRAM (TEE);  Surgeon: Prescott Gum, Collier Salina, MD;  Location: [facility];  Service: Open Heart Surgery;  Laterality: N/A;  . TRANSTHORACIC ECHOCARDIOGRAM  06/06/2017   Mild to moderate reduced EF 40 and 45%.  Anterior septal, inferoseptal and basal to mid inferior hypokinesis.  GR 1 DD.  No significant valvular lesion     Family History: Family History  Problem Relation Age of Onset  . Hypertension Mother   . Lymphoma Mother   . Depression Mother   . Arthritis Father   . Alcohol abuse Father   . Hypertension Sister   . Cancer Brother        kidney and lung  . Alcohol abuse Brother   . Alcohol abuse Paternal Uncle   . Alcohol abuse Paternal Grandfather   . Alcohol abuse Paternal Grandmother   . Allergic rhinitis Neg Hx   . Angioedema Neg Hx   . Asthma Neg Hx   . Atopy Neg Hx   . Eczema Neg Hx   . Immunodeficiency Neg Hx   . Urticaria Neg Hx      Social History: Quintasia lives at home by herself with her cats.  There is no known mold exposure at all. She has no problems with roaches or other insects. 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 She retired in the last year, shortly before the pandemic started.    Review of Systems  Constitutional: Negative.  Negative for chills, fever, malaise/fatigue and weight loss.  HENT: Negative.  Negative for congestion, ear discharge and ear pain.   Eyes: Negative for pain, discharge and redness.  Respiratory: Negative for cough, sputum production, shortness of breath and wheezing.   Cardiovascular: Negative.  Negative for chest pain and palpitations.  Gastrointestinal:  Negative for abdominal pain, constipation, diarrhea, heartburn, nausea and vomiting.  Skin: Positive for itching and rash.  Neurological: Negative for dizziness and headaches.  Endo/Heme/Allergies: Positive for environmental allergies. Does not bruise/bleed easily.       Objective:   Blood pressure (!) 142/86, pulse 100, temperature (!) 97.3 F (36.3 C), temperature source Temporal, resp. rate 20, height 5' 1.5" (1.562 m), weight 242 lb 9.6 oz (110 kg), SpO2 97 %. Body mass index is 45.1 kg/m.   Physical Exam:   Physical Exam  Constitutional: She appears well-developed.  Pleasant. Cooperative with the exam.   HENT:  Head: Normocephalic and atraumatic.  Right Ear: Tympanic membrane, external ear and ear canal normal. No drainage, swelling or tenderness. Tympanic membrane is not injected, not scarred, not erythematous, not retracted and not bulging.  Left Ear: Tympanic membrane, external ear and ear canal normal. No drainage, swelling or tenderness. Tympanic membrane is not injected, not scarred, not erythematous, not retracted and not bulging.  Nose: Mucosal edema and rhinorrhea present. No nasal deformity or septal deviation. No epistaxis. Right sinus exhibits no maxillary sinus tenderness and no frontal sinus tenderness. Left sinus exhibits no maxillary sinus tenderness and no frontal sinus tenderness.  Mouth/Throat: Uvula is midline and oropharynx is clear and moist. Mucous membranes are not pale and not dry.  Eyes: Pupils are equal, round, and reactive to light. EOM are normal. Right eye  exhibits no chemosis and no discharge. Left eye exhibits no chemosis and no discharge. Right conjunctiva is injected. Left conjunctiva is injected.  Cardiovascular: Normal rate, regular rhythm and normal heart sounds.  Respiratory: Effort normal and breath sounds normal. No accessory muscle usage. No tachypnea. No respiratory distress. She has no wheezes. She has no rhonchi. She has no rales. She  exhibits no tenderness.  Moving air well in all lung fields. No increased work of breathing noted.   GI: There is no abdominal tenderness. There is no rebound and no guarding.  Lymphadenopathy:       Head (right side): No submandibular, no tonsillar and no occipital adenopathy present.       Head (left side): No submandibular, no tonsillar and no occipital adenopathy present.    She has no cervical adenopathy.  Neurological: She is alert.  Skin: No abrasion, no petechiae and no rash noted. Rash is not papular, not vesicular and not urticarial. No erythema. No pallor.  There are multiple pustules over her arms where she seems to have been picking her skin. There is no discharge from these pustules. Overall her skin is very dermatographic, therefore we sent labs instead to avoid any false positives.   Psychiatric: She has a normal mood and affect.     Diagnostic studies: labs sent instead         Salvatore Marvel, MD [facility]

## 2019-04-23 NOTE — Patient Instructions (Addendum)
1. Chronic urticaria - Your history does not have any "red flags" such as fevers, joint pains, or permanent skin changes that would be concerning for a more serious cause of hives.  - We will get some labs to rule out serious causes of hives: environmental allergy panel, alpha gal panel, complete blood count, tryptase level, chronic urticaria panel, CMP, ESR, and CRP. - Chronic hives are often times a self limited process and will "burn themselves out" over 6-12 months, although this is not always the case.  - In the meantime, start suppressive dosing of antihistamines:   - Morning: Zyrtec (cetirizine) 58m (one tablet)  - Evening: Zyrtec (cetirizine) 140m(one tablet) + Pepcid (famotidine) 2060m You can change this dosing at home, decreasing the dose as needed or increasing the dosing as needed.  - If you are not tolerating the medications or are tired of taking them every day, we can start treatment with a monthly injectable medication called Xolair.   2. Anaphylaxis - EpiPen is up to date. - Anaphylaxis Management Plan.  - We are going to get the panel with the most common foods. - We are ordering labs, so please allow 1-2 weeks for the results to come back. - With the newly implemented Cures Act, the labs might be visible to you at the same time that they become visible to me. - However, I will not address the results until all of the results come  back, so please be patient.   3. Return in about 6 weeks (around 06/04/2019). This can be an in-person, a virtual Webex or a telephone follow up visit.   Please inform us [place] any Emergency Department visits, hospitalizations, or changes in symptoms. Call us [place] going to the ED for breathing or allergy symptoms since we might be able to fit you in for a sick visit. Feel free to contact us [place] with any questions, problems, or concerns.  It was a pleasure to meet you today!  Websites that have reliable patient information: 1. American  Academy of Asthma, Allergy, and Immunology: www.aaaai.org 2. Food Allergy Research and Education (FARE): foodallergy.org 3. Mothers of Asthmatics: http://www.asthmacommunitynetwork.org 4. American College of Allergy, Asthma, and Immunology: www.acaai.org  "Like" us [place] Facebook and Instagram for our latest updates!        Make sure you are registered to vote! If you have moved or changed any of your contact information, you will need to get this updated before voting!  In some cases, you MAY be able to register to vote online: [email or web address]

## 2019-04-25 ENCOUNTER — Encounter: Payer: Self-pay | Admitting: Allergy & Immunology

## 2019-04-25 MED ORDER — FAMOTIDINE 20 MG PO TABS: 20.0000 mg | ORAL_TABLET | Freq: Every day | ORAL | 5 refills | Status: DC

## 2019-04-25 MED ORDER — CETIRIZINE HCL 10 MG PO TABS: ORAL_TABLET | ORAL | 5 refills | Status: DC

## 2019-04-26 ENCOUNTER — Ambulatory Visit (INDEPENDENT_AMBULATORY_CARE_PROVIDER_SITE_OTHER): Payer: Medicare Other | Admitting: Internal Medicine

## 2019-04-27 ENCOUNTER — Encounter: Payer: Self-pay | Admitting: Allergy & Immunology

## 2019-04-27 ENCOUNTER — Encounter (INDEPENDENT_AMBULATORY_CARE_PROVIDER_SITE_OTHER): Payer: Self-pay

## 2019-04-27 DIAGNOSIS — M79676 Pain in unspecified toe(s): Secondary | ICD-10-CM | POA: Diagnosis not present

## 2019-04-27 DIAGNOSIS — E1142 Type 2 diabetes mellitus with diabetic polyneuropathy: Secondary | ICD-10-CM | POA: Diagnosis not present

## 2019-04-27 DIAGNOSIS — L84 Corns and callosities: Secondary | ICD-10-CM | POA: Diagnosis not present

## 2019-04-27 DIAGNOSIS — B351 Tinea unguium: Secondary | ICD-10-CM | POA: Diagnosis not present

## 2019-05-03 ENCOUNTER — Other Ambulatory Visit: Payer: Self-pay

## 2019-05-03 ENCOUNTER — Encounter (INDEPENDENT_AMBULATORY_CARE_PROVIDER_SITE_OTHER): Payer: Self-pay | Admitting: Internal Medicine

## 2019-05-03 ENCOUNTER — Ambulatory Visit (INDEPENDENT_AMBULATORY_CARE_PROVIDER_SITE_OTHER): Payer: Medicare Other | Admitting: Internal Medicine

## 2019-05-03 VITALS — Ht 64.0 in | Wt 238.0 lb

## 2019-05-03 DIAGNOSIS — D649 Anemia, unspecified: Secondary | ICD-10-CM

## 2019-05-03 DIAGNOSIS — K219 Gastro-esophageal reflux disease without esophagitis: Secondary | ICD-10-CM

## 2019-05-03 NOTE — Patient Instructions (Signed)
Physician will call with results of CBC when completed. 

## 2019-05-03 NOTE — Progress Notes (Signed)
Virtual Visit via Telephone Note  Patient had scheduled face-to-face visit today.  Visit was changed to virtual/telephone visit because of ongoing Covid-19 pandemic and we both agreed. I connected with [name] on 05/03/19 at 11:00 AM EST by telephone and verified that I am speaking with the correct person using two identifiers.  Location: Patient: home Provider: office   I discussed the limitations, risks, security and privacy concerns of performing an evaluation and management service by telephone and the availability of in person appointments. I also discussed with the patient that there may be a patient responsible charge related to this service. The patient expressed understanding and agreed to proceed.   History of Present Illness:  Patient is 69 year old Caucasian female with multiple medical problems including chronic GERD history of iron deficiency anemia as well as abdominal pain who was last seen in July 2020. Last EGD was in January 2018 for GERD symptoms and chest pain.  She has small sliding hiatal hernia and portal hypertensive gastropathy.  Biopsy revealed chronic gastritis and H. pylori stains were negative. Her last colonoscopy was in June 2018 revealing no abnormality.  Iron deficiency anemia was felt to be due to impaired iron absorption.  On her last visit she was complaining of nausea with infrequent vomiting.  She says she has not had any episode of nausea or in the last 6 months.  Similarly she does not experience heartburn very often even though last week she had heartburn daily for few days.  She also reports being constipated.  She is not having abdominal pain anymore.  She may use Tums once in a while.  She says abdominal ulcers have almost completely healed.  She uses Voltaren gel for knee pain as well as pain involving 4 finger right hand.  She says she is taking Requip for restless leg syndrome.  She is taking 3 times a day and she rarely uses tramadol.  She  feels she is doing well from GI standpoint. She had extensive blood work by Dr. Ernst Bowler but did not have a CBC.   Current Outpatient Medications:  .  aspirin EC 81 MG tablet, Take 1 tablet (81 mg total) by mouth daily., Disp: 90 tablet, Rfl: 3 .  baclofen (LIORESAL) 10 MG tablet, TAKE ONE TABLET BY MOUTH AT BEDTIME., Disp: 30 tablet, Rfl: 6 .  buPROPion (WELLBUTRIN XL) 300 MG 24 hr tablet, Take 1 tablet (300 mg total) by mouth every morning., Disp: 30 tablet, Rfl: 2 .  calcium carbonate (TUMS - DOSED IN MG ELEMENTAL CALCIUM) 500 MG chewable tablet, Chew 2 tablets by mouth as needed for indigestion or heartburn. , Disp: , Rfl:  .  carvedilol (COREG) 6.25 MG tablet, TAKE ONE TABLET BY MOUTH BID(MORNING ,EVENING), Disp: 180 tablet, Rfl: 3 .  cetirizine (ZYRTEC) 10 MG tablet, Take 1 tablet tablet 1-2 times daily. (Patient taking differently: Take 10 mg by mouth daily. Take 1 tablet tablet 1-2 times daily.), Disp: 60 tablet, Rfl: 5 .  diclofenac Sodium (VOLTAREN) 1 % GEL, Apply topically 4 (four) times daily., Disp: , Rfl:  .  diphenhydrAMINE (BENADRYL) 25 mg capsule, Take 25 mg by mouth as needed., Disp: , Rfl:  .  EPINEPHrine (EPIPEN 2-PAK) 0.3 mg/0.3 mL IJ SOAJ injection, Inject 0.3 mg into the muscle once., Disp: , Rfl:  .  esomeprazole (NEXIUM) 40 MG capsule, Take 40 mg by mouth daily before supper. , Disp: , Rfl: 12 .  famotidine (PEPCID) 20 MG tablet, Take 1 tablet (20  mg total) by mouth daily., Disp: 30 tablet, Rfl: 5 .  gabapentin (NEURONTIN) 300 MG capsule, Take 1 capsule (300 mg total) by mouth 2 (two) times daily., Disp: , Rfl:  .  glipiZIDE (GLUCOTROL XL) 5 MG 24 hr tablet, Take 1 tablet (5 mg total) by mouth daily with breakfast., Disp: 30 tablet, Rfl: 3 .  hydrOXYzine (ATARAX/VISTARIL) 10 MG tablet, Take 1 tablet (10 mg total) by mouth 3 (three) times daily as needed for itching., Disp: 30 tablet, Rfl: 0 .  lamoTRIgine (LAMICTAL) 150 MG tablet, Take 1 tablet (150 mg total) by mouth at  bedtime., Disp: 30 tablet, Rfl: 2 .  levothyroxine (SYNTHROID, LEVOTHROID) 100 MCG tablet, Take 100 mcg by mouth daily before breakfast. , Disp: , Rfl:  .  losartan (COZAAR) 25 MG tablet, TAKE ONE TABLET BY MOUTH DAILY (MORNING), Disp: 90 tablet, Rfl: 1 .  meclizine (ANTIVERT) 25 MG tablet, Take 1 tablet (25 mg total) by mouth 3 (three) times daily as needed for dizziness. (Patient taking differently: Take 25 mg by mouth daily as needed for dizziness. ), Disp: 30 tablet, Rfl: 0 .  metFORMIN (GLUCOPHAGE) 500 MG tablet, TAKE ONE TABLET BY MOUTH TWICE DAILY. TAKE WITH A MEAL. (MORNING ,EVENING), Disp: 180 tablet, Rfl: 0 .  ondansetron (ZOFRAN) 4 MG tablet, Take 1 tablet (4 mg total) by mouth 2 (two) times daily as needed for nausea or vomiting., Disp: 30 tablet, Rfl: 1 .  potassium chloride SA (KLOR-CON) 20 MEQ tablet, Take 1 tablet (20 mEq total) by mouth daily as needed (when taking torsemide). 3 x week, Disp: 30 tablet, Rfl: 2 .  rOPINIRole (REQUIP) 2 MG tablet, Take 2 mg by mouth 3 (three) times daily. ., Disp: , Rfl:  .  temazepam (RESTORIL) 15 MG capsule, Take 1 capsule (15 mg total) by mouth at bedtime as needed for sleep., Disp: 30 capsule, Rfl: 2 .  torsemide (DEMADEX) 20 MG tablet, TAKE ONE TABLET BY MOUTH ALTERNATING WITH TWO TABLETS daily (PER PT ONE DAILY-EXTRA IN BOTTLE), Disp: 135 tablet, Rfl: 3 .  traMADol (ULTRAM) 50 MG tablet, Take 100 mg by mouth 4 (four) times daily as needed. , Disp: , Rfl:  .  venlafaxine XR (EFFEXOR-XR) 150 MG 24 hr capsule, TAKE TWO (2) CAPSULES BY MOUTH AT BEDTIME., Disp: 60 capsule, Rfl: 2   Observations/Objective:  Patient weighed 239 pounds on 10/20/2018. No recent weight available.  H&H was 8.8 and 27.8 on 09/05/2017. Hemoglobin was 11 g on 04/24/2018  Assessment and Plan:  #1 Chronic GERD.  She is doing well with therapy.  #2.  History of iron deficiency anemia.  Hemoglobin was 11 g 1 year ago.  CBC needs to be repeated.  #3.  History of nausea with  sporadic vomiting.  The symptoms have resolved since her last visit.   Follow Up Instructions:    Patient will go to the lab for CBC within the next few weeks when she can. She will continue esomeprazole 40 mg by mouth 30 minutes before breakfast daily and famotidine at bedtime as needed. Office visit in 1 year. I discussed the assessment and treatment plan with the patient. The patient was provided an opportunity to ask questions and all were answered. The patient agreed with the plan and  Demonstrated an understanding of the instructions.   The patient was advised to call back or seek an in-person evaluation if the symptoms worsen or if the condition fails to improve as anticipated.  I provided 12 minutes  of non-face-to-face time during this encounter.   Hildred Laser, MD

## 2019-05-05 LAB — IGE+ALLERGENS ZONE 2(30)

## 2019-05-05 LAB — CMP14+EGFR
ALT: 14 IU/L (ref 0–32)
AST: 14 IU/L (ref 0–40)
Albumin/Globulin Ratio: 1.2 (ref 1.2–2.2)
Albumin: 3.7 g/dL — ABNORMAL LOW (ref 3.8–4.8)
Alkaline Phosphatase: 73 IU/L (ref 39–117)
BUN/Creatinine Ratio: 9 — ABNORMAL LOW (ref 12–28)
BUN: 11 mg/dL (ref 8–27)
Bilirubin Total: <0.2 mg/dL (ref 0.0–1.2)
CO2: 24 mmol/L (ref 20–29)
Calcium: 9.3 mg/dL (ref 8.7–10.3)
Chloride: 102 mmol/L (ref 96–106)
Creatinine, Ser: 1.18 mg/dL — ABNORMAL HIGH (ref 0.57–1.00)
GFR calc Af Amer: 56 mL/min/{1.73_m2} — ABNORMAL LOW (ref >59)
GFR calc non Af Amer: 48 mL/min/{1.73_m2} — ABNORMAL LOW (ref >59)
Globulin, Total: 3 g/dL (ref 1.5–4.5)
Glucose: 194 mg/dL — ABNORMAL HIGH (ref 65–99)
Potassium: 4.4 mmol/L (ref 3.5–5.2)
Sodium: 140 mmol/L (ref 134–144)
Total Protein: 6.7 g/dL (ref 6.0–8.5)

## 2019-05-05 LAB — ALPHA-GAL PANEL
Alpha Gal IgE*: <0.1 kU/L (ref <0.10)
Beef (Bos spp) IgE: <0.1 kU/L (ref <0.35)
Class Interpretation: 0
Class Interpretation: 0
Class Interpretation: 0
Lamb/Mutton (Ovis spp) IgE: <0.1 kU/L (ref <0.35)
Pork (Sus spp) IgE: <0.1 kU/L (ref <0.35)

## 2019-05-05 LAB — ANA W/REFLEX IF POSITIVE: Anti Nuclear Antibody (ANA): NEGATIVE

## 2019-05-05 LAB — ALLERGEN PROFILE, BASIC FOOD
Allergen Corn, IgE: <0.1 kU/L
Beef IgE: <0.1 kU/L
Chocolate/Cacao IgE: <0.1 kU/L
Egg, Whole IgE: <0.1 kU/L
Food Mix (Seafoods) IgE: NEGATIVE
Milk IgE: <0.1 kU/L
Peanut IgE: <0.1 kU/L
Pork IgE: <0.1 kU/L
Soybean IgE: <0.1 kU/L
Wheat IgE: <0.1 kU/L

## 2019-05-05 LAB — C-REACTIVE PROTEIN: CRP: 2 mg/L (ref 0–10)

## 2019-05-05 LAB — ALLERGY PANEL 19, SEAFOOD GROUP
Allergen Salmon IgE: <0.1 kU/L
Catfish: <0.1 kU/L
Codfish IgE: <0.1 kU/L
F023-IgE Crab: <0.1 kU/L
F080-IgE Lobster: <0.1 kU/L
Shrimp IgE: <0.1 kU/L
Tuna: <0.1 kU/L

## 2019-05-05 LAB — THYROID ANTIBODIES
Thyroglobulin Antibody: 3 IU/mL — ABNORMAL HIGH (ref 0.0–0.9)
Thyroperoxidase Ab SerPl-aCnc: 20 IU/mL (ref 0–34)

## 2019-05-05 LAB — ALLERGEN PROFILE, MOLD
Aureobasidi Pullulans IgE: <0.1 kU/L
Candida Albicans IgE: <0.1 kU/L
M009-IgE Fusarium proliferatum: <0.1 kU/L
M014-IgE Epicoccum purpur: <0.1 kU/L
Phoma Betae IgE: <0.1 kU/L
Setomelanomma Rostrat: <0.1 kU/L

## 2019-05-05 LAB — CHRONIC URTICARIA: cu index: 5.3 (ref <10)

## 2019-05-05 LAB — TRYPTASE: Tryptase: 9.4 ug/L (ref 2.2–13.2)

## 2019-05-05 LAB — SEDIMENTATION RATE: Sed Rate: 18 mm/hr (ref 0–40)

## 2019-05-11 ENCOUNTER — Encounter: Payer: Self-pay | Admitting: "Endocrinology

## 2019-05-11 ENCOUNTER — Ambulatory Visit (INDEPENDENT_AMBULATORY_CARE_PROVIDER_SITE_OTHER): Payer: Medicare Other | Admitting: "Endocrinology

## 2019-05-11 DIAGNOSIS — E782 Mixed hyperlipidemia: Secondary | ICD-10-CM | POA: Diagnosis not present

## 2019-05-11 DIAGNOSIS — E1159 Type 2 diabetes mellitus with other circulatory complications: Secondary | ICD-10-CM | POA: Diagnosis not present

## 2019-05-11 DIAGNOSIS — I10 Essential (primary) hypertension: Secondary | ICD-10-CM | POA: Diagnosis not present

## 2019-05-11 DIAGNOSIS — E038 Other specified hypothyroidism: Secondary | ICD-10-CM | POA: Diagnosis not present

## 2019-05-11 NOTE — Patient Instructions (Signed)
                                     Advice for Weight Management  -For most of us the best way to lose weight is by diet management. Generally speaking, diet management means consuming less calories intentionally which over time brings about progressive weight loss.  This can be achieved more effectively by restricting carbohydrate consumption to the minimum possible.  So, it is critically important to know your numbers: how much calorie you are consuming and how much calorie you need. More importantly, our carbohydrates sources should be unprocessed or minimally processed complex starch food items.   Sometimes, it is important to balance nutrition by increasing protein intake (animal or plant source), fruits, and vegetables.  -Sticking to a routine mealtime to eat 3 meals a day and avoiding unnecessary snacks is shown to have a big role in weight control. Under normal circumstances, the only time we lose real weight is when we are hungry, so allow hunger to take place- hunger means no food between meal times, only water.  It is not advisable to starve.   -It is better to avoid simple carbohydrates including: Cakes, Sweet Desserts, Ice Cream, Soda (diet and regular), Sweet Tea, Candies, Chips, Cookies, Store Bought Juices, Alcohol in Excess of  1-2 drinks a day, Artificial Sweeteners, Doughnuts, Coffee Creamers, "Sugar-free" Products, etc, etc.  This is not a complete list.....    -Consulting with certified diabetes educators is proven to provide you with the most accurate and current information on diet.  Also, you may be  interested in discussing diet options/exchanges , we can schedule a visit with Penny Crumpton, RDN, CDE for individualized nutrition education.  -Exercise: If you are able: 30 -60 minutes a day ,4 days a week, or 150 minutes a week.  The longer the better.  Combine stretch, strength, and aerobic activities.  If you were told in the past that you  have high risk for cardiovascular diseases, you may seek evaluation by your heart doctor prior to initiating moderate to intense exercise programs.                                  Additional Care Considerations for Diabetes   -Diabetes  is a chronic disease.  The most important care consideration is regular follow-up with your diabetes care provider with the goal being avoiding or delaying its complications and to take advantage of advances in medications and technology.    -Type 2 diabetes is known to coexist with other important comorbidities such as high blood pressure and high cholesterol.  It is critical to control not only the diabetes but also the high blood pressure and high cholesterol to minimize and delay the risk of complications including coronary artery disease, stroke, amputations, blindness, etc.    - Studies showed that people with diabetes will benefit from a class of medications known as ACE inhibitors and statins.  Unless there are specific reasons not to be on these medications, the standard of care is to consider getting one from these groups of medications at an optimal doses.  These medications are generally considered safe and proven to help protect the heart and the kidneys.    - People with diabetes are encouraged to initiate and maintain regular follow-up with eye doctors, foot doctors, dentists ,   and if necessary heart and kidney doctors.     - It is highly recommended that people with diabetes quit smoking or stay away from smoking, and get yearly  flu vaccine and pneumonia vaccine at least every 5 years.  One other important lifestyle recommendation is to ensure adequate sleep - at least 6-7 hours of uninterrupted sleep at night.  -Exercise: If you are able: 30 -60 minutes a day, 4 days a week, or 150 minutes a week.  The longer the better.  Combine stretch, strength, and aerobic activities.  If you were told in the past that you have high risk for cardiovascular  diseases, you may seek evaluation by your heart doctor prior to initiating moderate to intense exercise programs.     COVID-19 Vaccine Information can be found at: [email or web address] For questions related to vaccine distribution or appointments, please email [email or web address] or call [phone]        

## 2019-05-11 NOTE — Progress Notes (Signed)
05/11/2019                                                    Endocrinology Telehealth Visit Follow up Note -During COVID -19 Pandemic  This visit type was conducted due to national recommendations for restrictions regarding the COVID-19 Pandemic  in an effort to limit this patient's exposure and mitigate transmission of the corona virus.  Due to her co-morbid illnesses, Norma [name] is at  moderate to high risk for complications without adequate follow up.  This format is felt to be most appropriate for her at this time.  I connected with this patient on 05/11/2019   by telephone and verified that I am speaking with the correct person using two identifiers. Norma [name], [birth year]. she has verbally consented to this visit. All issues noted in this document were discussed and addressed. The format was not optimal for physical exam.     Subjective:    Patient ID: Norma [name], female    DOB: [birth year],    Past Medical History:  Diagnosis Date  . Anemia   . Anxiety   . Bipolar disorder ([place])   . Bulging lumbar disc    L3-4  . Chronic daily headache   . Chronic low back pain 09/20/2014  . COPD (chronic obstructive pulmonary disease) ([place])   . Degenerative arthritis   . Depression   . Diabetes mellitus, type II ([place])   . Diabetic neuropathy ([place]) 09/16/2018  . DM type 2 with diabetic peripheral neuropathy ([place]) 09/20/2014  . Dyslipidemia   . Dyspnea   . Gastroparesis   . GERD (gastroesophageal reflux disease)   . Heart murmur   . History of hiatal hernia   . Hypothyroidism   . IBS (irritable bowel syndrome)   . Memory difficulty 09/20/2014  . Morbid obesity ([place])   . Neuropathy   . Obstructive sleep apnea on CPAP   . Restless legs syndrome (RLS) 09/17/2012  . Stroke (cerebrum) ([place]) 05/16/2017   Left parietal  . Urticaria    Past Surgical History:  Procedure Laterality Date  . ABDOMINAL HYSTERECTOMY    . ARTHROSCOPY KNEE W/ DRILLING  06/2011   and Decemer of 2013.  [name]  Carpel tunnel  1980's  . CATARACT EXTRACTION W/PHACO Right 03/21/2017   Procedure: CATARACT EXTRACTION PHACO AND INTRAOCULAR LENS PLACEMENT RIGHT EYE;  Surgeon: Baruch Goldmann, MD;  Location: [facility];  Service: Ophthalmology;  Laterality: Right;  CDE: 2.91   . CATARACT EXTRACTION W/PHACO Left 04/18/2017   Procedure: CATARACT EXTRACTION PHACO AND INTRAOCULAR LENS PLACEMENT LEFT EYE;  Surgeon: Baruch Goldmann, MD;  Location: [facility];  Service: Ophthalmology;  Laterality: Left;  left  . CHOLECYSTECTOMY    . COLONOSCOPY WITH PROPOFOL N/A 10/04/2016   Procedure: COLONOSCOPY WITH PROPOFOL;  Surgeon: Rogene Houston, MD;  Location: [facility];  Service: Endoscopy;  Laterality: N/A;  11:10  . CORONARY ARTERY BYPASS GRAFT N/A 08/29/2017   Procedure: CORONARY ARTERY BYPASS GRAFTING (CABG) x 3 WITH ENDOSCOPIC HARVESTING OF RIGHT SAPHENOUS VEIN;  Surgeon: Ivin Poot, MD;  Location: [facility];  Service: Open Heart Surgery;  Laterality: N/A;  . ESOPHAGOGASTRODUODENOSCOPY N/A 04/25/2016   Procedure: ESOPHAGOGASTRODUODENOSCOPY (EGD);  Surgeon: Rogene Houston, MD;  [facility];  Service: Endoscopy;  Laterality: N/A;  730  . LEFT HEART  CATH AND CORONARY ANGIOGRAPHY N/A 08/27/2017   Procedure: LEFT HEART CATH AND CORONARY ANGIOGRAPHY;  Surgeon: Sherren Mocha, MD;  Location: [facility];  Service: Cardiovascular::  pLAD 95% - p-mLAD 50%, ostD1 90% -pD1 80%; ostOM1 90%; rPDA 80%. EF ~50-55% - HK of dital Anterolateral & Apical wall.  - Rec CVTS c/s  . RECTAL SURGERY     fissure  . SHOULDER SURGERY Left    arthroscopy in March of this year  . TEE WITHOUT CARDIOVERSION N/A 08/29/2017   Procedure: TRANSESOPHAGEAL ECHOCARDIOGRAM (TEE);  Surgeon: Prescott Gum, Collier Salina, MD;  Location: [facility];  Service: Open Heart Surgery;  Laterality: N/A;  . TRANSTHORACIC ECHOCARDIOGRAM  06/06/2017   Mild to moderate reduced EF 40 and 45%.  Anterior septal, inferoseptal and basal to mid inferior hypokinesis.  GR 1 DD.  No  significant valvular lesion   Social History   Socioeconomic History  . Marital status: Single    Spouse name: Not on file  . Number of children: 0  . Years of education: 81  . Highest education level: Not on file  Occupational History    Employer: [employer]  Tobacco Use  . Smoking status: Former Smoker    Types: Cigarettes    Quit date: 02/04/1971    Years since quitting: 48.2  . Smokeless tobacco: Never Used  . Tobacco comment: smoked 2 cigarettes a day  Substance and Sexual Activity  . Alcohol use: No    Alcohol/week: 0.0 standard drinks  . Drug use: No  . Sexual activity: Never  Other Topics Concern  . Not on file  Social History Narrative   Patient lives at home alone.    Patient has no children.    Patient has her masters in nursing.    Patient is single.    Patient drinks about 2 glasses of tea daily.   Patient is right handed.   Social Determinants of Health   Financial Resource Strain:   . Difficulty of Paying Living Expenses: Not on file  Food Insecurity:   . Worried About Charity fundraiser in the Last Year: Not on file  . Ran Out of Food in the Last Year: Not on file  Transportation Needs:   . Lack of Transportation (Medical): Not on file  . Lack of Transportation (Non-Medical): Not on file  Physical Activity:   . Days of Exercise per Week: Not on file  . Minutes of Exercise per Session: Not on file  Stress:   . Feeling of Stress : Not on file  Social Connections:   . Frequency of Communication with Friends and Family: Not on file  . Frequency of Social Gatherings with Friends and Family: Not on file  . Attends Religious Services: Not on file  . Active Member of Clubs or Organizations: Not on file  . Attends Archivist Meetings: Not on file  . Marital Status: Not on file   Outpatient Encounter Medications as of 05/11/2019  Medication Sig  . aspirin EC 81 MG tablet Take 1 tablet (81 mg total) by mouth daily.  . baclofen  (LIORESAL) 10 MG tablet TAKE ONE TABLET BY MOUTH AT BEDTIME.  [name] buPROPion (WELLBUTRIN XL) 300 MG 24 hr tablet Take 1 tablet (300 mg total) by mouth every morning.  . calcium carbonate (TUMS - DOSED IN MG ELEMENTAL CALCIUM) 500 MG chewable tablet Chew 2 tablets by mouth as needed for indigestion or heartburn.   . carvedilol (COREG) 6.25 MG tablet  TAKE ONE TABLET BY MOUTH BID(MORNING ,EVENING)  . cetirizine (ZYRTEC) 10 MG tablet Take 1 tablet tablet 1-2 times daily. (Patient taking differently: Take 10 mg by mouth daily. Take 1 tablet tablet 1-2 times daily.)  . diclofenac Sodium (VOLTAREN) 1 % GEL Apply topically 4 (four) times daily.  . diphenhydrAMINE (BENADRYL) 25 mg capsule Take 25 mg by mouth as needed.  [name] EPINEPHrine (EPIPEN 2-PAK) 0.3 mg/0.3 mL IJ SOAJ injection Inject 0.3 mg into the muscle once.  [name] esomeprazole (NEXIUM) 40 MG capsule Take 40 mg by mouth daily before supper.   . famotidine (PEPCID) 20 MG tablet Take 1 tablet (20 mg total) by mouth daily.  [name] gabapentin (NEURONTIN) 300 MG capsule Take 1 capsule (300 mg total) by mouth 2 (two) times daily.  [name] glipiZIDE (GLUCOTROL XL) 5 MG 24 hr tablet Take 1 tablet (5 mg total) by mouth daily with breakfast.  . hydrOXYzine (ATARAX/VISTARIL) 10 MG tablet Take 1 tablet (10 mg total) by mouth 3 (three) times daily as needed for itching.  . lamoTRIgine (LAMICTAL) 150 MG tablet Take 1 tablet (150 mg total) by mouth at bedtime.  [name] levothyroxine (SYNTHROID, LEVOTHROID) 100 MCG tablet Take 100 mcg by mouth daily before breakfast.   . losartan (COZAAR) 25 MG tablet TAKE ONE TABLET BY MOUTH DAILY (MORNING)  . meclizine (ANTIVERT) 25 MG tablet Take 1 tablet (25 mg total) by mouth 3 (three) times daily as needed for dizziness. (Patient taking differently: Take 25 mg by mouth daily as needed for dizziness. )  . metFORMIN (GLUCOPHAGE) 500 MG tablet TAKE ONE TABLET BY MOUTH TWICE DAILY. TAKE WITH A MEAL. (MORNING ,EVENING)  . ondansetron (ZOFRAN) 4 MG tablet  Take 1 tablet (4 mg total) by mouth 2 (two) times daily as needed for nausea or vomiting.  . potassium chloride SA (KLOR-CON) 20 MEQ tablet Take 1 tablet (20 mEq total) by mouth daily as needed (when taking torsemide). 3 x week  . rOPINIRole (REQUIP) 2 MG tablet Take 2 mg by mouth 3 (three) times daily. .  . temazepam (RESTORIL) 15 MG capsule Take 1 capsule (15 mg total) by mouth at bedtime as needed for sleep.  [name] torsemide (DEMADEX) 20 MG tablet TAKE ONE TABLET BY MOUTH ALTERNATING WITH TWO TABLETS daily (PER PT ONE DAILY-EXTRA IN BOTTLE)  . traMADol (ULTRAM) 50 MG tablet Take 100 mg by mouth 4 (four) times daily as needed.   . venlafaxine XR (EFFEXOR-XR) 150 MG 24 hr capsule TAKE TWO (2) CAPSULES BY MOUTH AT BEDTIME.   No facility-administered encounter medications on file as of 05/11/2019.   ALLERGIES: Allergies  Allergen Reactions  . Amoxicillin Anaphylaxis and Other (See Comments)    Has patient had a PCN reaction causing immediate rash, facial/tongue/throat swelling, SOB or lightheadedness with hypotension: Yes Has patient had a PCN reaction causing severe rash involving mucus membranes or skin necrosis: Yes Has patient had a PCN reaction that required hospitalization: Yes Has patient had a PCN reaction occurring within the last 10 years: Yes If all of the above answers are "NO", then may proceed with Cephalosporin use.   [name] Hydrocodone Anaphylaxis  . Percocet [Oxycodone-Acetaminophen] Other (See Comments)    Causes chest pain /tightness. Pressure pain around her ribs.  . Vancomycin Itching  . Depacon [Valproic Acid] Other (See Comments)    Causes falls    VACCINATION STATUS: Immunization History  Administered Date(s) Administered  . Influenza,inj,Quad PF,6+ Mos 01/20/2018    Diabetes She presents for her follow-up diabetic  visit. She has type 2 diabetes mellitus. Onset time: She was diagnosed at approximate age of 28 years. Her disease course has been stable. There are no  hypoglycemic associated symptoms. Pertinent negatives for hypoglycemia include no confusion, headaches, pallor or seizures. Associated symptoms include fatigue. Pertinent negatives for diabetes include no chest pain, no polydipsia, no polyphagia and no polyuria. There are no hypoglycemic complications. Symptoms are stable. Diabetic complications include heart disease. (Since her last visit, she underwent coronary artery bypass graft for 3 coronary vessels.) Risk factors for coronary artery disease include diabetes mellitus, dyslipidemia, hypertension, obesity and sedentary lifestyle. Current diabetic treatment includes oral agent (dual therapy) and insulin injections. She is compliant with treatment most of the time. Her weight is stable. She is following a generally unhealthy diet. She has not had a previous visit with a dietitian. She never participates in exercise. Her breakfast blood glucose range is generally 110-130 mg/dl. Her overall blood glucose range is 110-130 mg/dl. (She reports that her glycemic profile ranges between 88-174.  Her previsit labs show A1c of 6.8%.  No reports of hypoglycemia.    ) An ACE inhibitor/angiotensin II receptor blocker is being taken. Eye exam is current.  Hypertension This is a chronic problem. The current episode started more than 1 year ago. Pertinent negatives include no chest pain, headaches, palpitations or shortness of breath. Past treatments include ACE inhibitors. Compliance problems include diet, exercise, medication cost, medication side effects and psychosocial issues.  Identifiable causes of hypertension include a thyroid problem.  Hyperlipidemia This is a chronic problem. The current episode started more than 1 year ago. Pertinent negatives include no chest pain, myalgias or shortness of breath. Current antihyperlipidemic treatment includes statins. Risk factors for coronary artery disease include a sedentary lifestyle, obesity, hypertension, diabetes  mellitus and dyslipidemia.  Thyroid Problem Presents for follow-up visit. Symptoms include fatigue. Patient reports no cold intolerance, diarrhea, heat intolerance or palpitations. The symptoms have been stable. Past treatments include levothyroxine. Her past medical history is significant for hyperlipidemia.    Review of systems: Limited as above.   Objective:    There were no vitals taken for this visit.  Wt Readings from Last 3 Encounters:  05/03/19 238 lb (108 kg)  04/23/19 242 lb 9.6 oz (110 kg)  03/31/19 238 lb (108 kg)       CMP Latest Ref Rng & Units 04/23/2019 03/05/2019 03/05/2019  Glucose 65 - 99 mg/dL 194(H) 145(H) 140(H)  BUN 8 - 27 mg/dL 11 19 18   Creatinine 0.57 - 1.00 mg/dL 1.18(H) 1.10(H) 1.14(H)  Sodium 134 - 144 mmol/L 140 140 140  Potassium 3.5 - 5.2 mmol/L 4.4 4.4 4.6  Chloride 96 - 106 mmol/L 102 101 101  CO2 20 - 29 mmol/L 24 28 26   Calcium 8.7 - 10.3 mg/dL 9.3 9.3 9.4  Total Protein 6.0 - 8.5 g/dL 6.7 - 6.6  Total Bilirubin 0.0 - 1.2 mg/dL <0.2 - 0.3  Alkaline Phos 39 - 117 IU/L 73 - -  AST 0 - 40 IU/L 14 - 13  ALT 0 - 32 IU/L 14 - 12   Lipid Panel     Component Value Date/Time   CHOL 248 (H) 03/05/2019 1221   TRIG 175 (H) 03/05/2019 1221   HDL 65 03/05/2019 1221   CHOLHDL 3.8 03/05/2019 1221   VLDL 31 (H) 07/10/2016 0934   LDLCALC 152 (H) 03/05/2019 1221     Diabetic Labs (most recent): Lab Results  Component Value Date   HGBA1C 6.8 (  H) 03/05/2019   HGBA1C 6.4 (H) 10/30/2018   HGBA1C 7.2 04/24/2018    Assessment & Plan:   1. Uncontrolled type 2 diabetes mellitus with coronary artery disease status post coronary artery bypass graft   Her diabetes is  complicated by coronary artery disease, neuropathy and patient remains at a high risk for more acute and chronic complications of diabetes which include CAD, CVA, CKD, retinopathy, and neuropathy. These are all discussed in detail with the patient.  -She reports near target glycemic  profile both fasting and postprandial, despite the fact that she was advised to stop insulin due to some nonspecific skin rash.    Her previsit labs show A1c of 6.8%, remaining stable.  - I have re-counseled the patient on diet management and weight loss  by adopting a carbohydrate restricted / protein rich  Diet.  - she  admits there is a room for improvement in her diet and drink choices. -  Suggestion is made for her to avoid simple carbohydrates  from her diet including Cakes, Sweet Desserts / Pastries, Ice Cream, Soda (diet and regular), Sweet Tea, Candies, Chips, Cookies, Sweet Pastries,  Store Bought Juices, Alcohol in Excess of  1-2 drinks a day, Artificial Sweeteners, Coffee Creamer, and "Sugar-free" Products. This will help patient to have stable blood glucose profile and potentially avoid unintended weight gain.   - Patient is advised to stick to a routine mealtimes to eat 3 meals  a day and avoid unnecessary snacks ( to snack only to correct hypoglycemia).  - I have approached patient with the following individualized plan to manage diabetes and patient agrees.  -She has done very well off of insulin.  Will be kept off of insulin for now. -She is advised to continue Metformin 500 mg p.o. twice a day-daily after breakfast and after supper.  She is advised to continue glipizide 5 mg XL p.o. daily with breakfast.   - Patient will be considered for incretin therapy as appropriate next visit. - Patient specific target  for A1c; LDL, HDL, Triglycerides, and  Waist Circumference were discussed in detail.  2) BP/HTN:  she is advised to home monitor blood pressure and report if > 140/90 on 2 separate readings.   -She is advised to continue on her current medications including Lasix, metoprolol.  3) Lipids/HPL: Her recent lipid panel showed LDL uncontrolled at 83.  She is advised to continue Lipitor 10 mg p.o. daily at bedtime.     4)  Weight/Diet: CDE consult in progress, exercise, and  carbohydrates information provided.  5) Hypothyroidism  - Her previsit thyroid function tests are consistent with appropriate replacement.    She is now on amiodarone 200 mg p.o. twice daily. -She is advised to continue levothyroxine 100 mcg p.o. every morning.    - We discussed about the correct intake of her thyroid hormone, on empty stomach at fasting, with water, separated by at least 30 minutes from breakfast and other medications,  and separated by more than 4 hours from calcium, iron, multivitamins, acid reflux medications (PPIs). -Patient is made aware of the fact that thyroid hormone replacement is needed for life, dose to be adjusted by periodic monitoring of thyroid function tests.   6) Chronic Care/Health Maintenance:  -Patient is on ACEI/ARB and Statin medications and encouraged to continue to follow up with Ophthalmology, Podiatrist at least yearly or according to recommendations, and advised to  stay away from smoking. I have recommended yearly flu vaccine and pneumonia vaccination at least  every 5 years; moderate intensity exercise for up to 150 minutes weekly; and  sleep for at least 7 hours a day.  I advised patient to maintain close follow up with her PCP for primary care needs.  - Time spent on this patient care encounter:  35 min, of which >50% was spent in  counseling and the rest reviewing her  current and  previous labs/studies ( including abstraction from other facilities),  previous treatments, her blood glucose readings, and medications' doses and developing a plan for long-term care based on the latest recommendations for standards of care; and documenting her care.  Norma [name] participated in the discussions, expressed understanding, and voiced agreement with the above plans.  All questions were answered to her satisfaction. she is encouraged to contact clinic should she have any questions or concerns prior to her return visit.   Follow up plan: Return in  about 3 months (around 08/09/2019) for Bring Meter and Logs- A1c in Office.  Glade Lloyd, MD Phone: [phone]  Fax: [phone]  This note was partially dictated with voice recognition software. Similar sounding words can be transcribed inadequately or may not  be corrected upon review.  05/11/2019, 9:33 PM

## 2019-05-17 ENCOUNTER — Ambulatory Visit (INDEPENDENT_AMBULATORY_CARE_PROVIDER_SITE_OTHER): Payer: Medicare Other | Admitting: Psychiatry

## 2019-05-17 ENCOUNTER — Other Ambulatory Visit: Payer: Self-pay

## 2019-05-17 ENCOUNTER — Encounter (HOSPITAL_COMMUNITY): Payer: Self-pay | Admitting: Psychiatry

## 2019-05-17 DIAGNOSIS — F331 Major depressive disorder, recurrent, moderate: Secondary | ICD-10-CM

## 2019-05-17 NOTE — Progress Notes (Signed)
Virtual Visit via Telephone Note  I connected with Norma [name] on 05/17/19 at 11:00 AM EST by telephone and verified that I am speaking with the correct person using two identifiers.   I discussed the limitations, risks, security and privacy concerns of performing an evaluation and management service by telephone and the availability of in person appointments. I also discussed with the patient that there may be a patient responsible charge related to this service. The patient expressed understanding and agreed to proceed.  I provided 40  minutes of non-face-to-face time during this encounter.   Norma Smoker, LCSW    THERAPIST PROGRESS NOTE  Session Time: Monday 05/17/2019 11:00 AM -  11:40 AM       Particip   ation Level: Active  Behavioral Response: Alert/euthymic  Type of Therapy: Individual Therapy  Treatment Goals addressed: learn and implement cognitive and behavioral strategies to overcome depression  Interventions: CBT and Supportive  Summary: Norma [name] is a 69 y.o. female who is referred for services by psychiatrist Dr. Harrington Challenger  due to experiencing symptoms of anxiety and depression. 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  She reports at least 3 psychiatric hospitlaizations with the last one occuring several years ago at [facility]. She is a returning patient to this writer/clinician as she was seen once in 2017. She reports problems with insurance coverage prevented her from returning to treatment sooner. She states being more depressed and just not being as calm as she used to be. 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 She reports ruminating about the past as she and the owner of the facility used to be friends but had conflict that led to patient leaving her job. She reports financial stress due to several bills. She also reports stress regarding her nephews' dogs as they have damaged her home.    Patient last was seen about 2 - 3 weeks ago via virtual visit.  She reports continuing to do well since last session.  Per her report, her nephew has moved out permanently but they still have a good relationship and frequent contact.  Patient reports enjoying residing alone and taking care of her animals.  She also performs light household tasks and enjoys watching television.  She maintains contact with her sister and her church family.  She reports minimal to no symptoms of depression.  She reports she was having some financial stress but that has been relieved as she recently received a stimulus check.  Patient is pleased with her progress in treatment.  Suicidal/Homicidal: Nowithout intent/plan  Therapist Response: reviewed symptoms, administered PHQ-2, discussed lapse versus relapse of depression, assisted patient identify early warning signs of depression and ways to intervene, discussed ways to maintain positive mental health, processed patient's feelings about termination of psychotherapy services, did termination, encouraged patient to contact this practice should she need psychotherapy services in the future, patient will continue to see psychiatrist for medication management  Plan: Patient has accomplished goals, treatment completed.  She will continue to see psychiatrist for medication management.  She is encouraged to call this practice should she need psychotherapy services  in the future.  Diagnosis: Axis I: MDD, moderate    Axis II: Deferred    Norma Smoker, LCSW 05/17/2019 Outpatient Therapist Discharge Summary  [facility]    [birth year]   Admission Date:  02/19/2019 Discharge Date:  05/17/2019 Reason for Discharge:  Treatment completed Diagnosis:  Axis I:  MDD   Comments:  Patient  will continue to see psychiatrist for medication management.  She is encouraged to call this practice should she need psychotherapy services in the future.   Norma [name] E Norma Blinn LCSW

## 2019-05-20 ENCOUNTER — Other Ambulatory Visit (HOSPITAL_COMMUNITY): Payer: Self-pay | Admitting: Psychiatry

## 2019-05-20 ENCOUNTER — Other Ambulatory Visit: Payer: Self-pay | Admitting: "Endocrinology

## 2019-05-27 ENCOUNTER — Ambulatory Visit: Payer: Medicare Other | Admitting: Internal Medicine

## 2019-06-02 ENCOUNTER — Ambulatory Visit (INDEPENDENT_AMBULATORY_CARE_PROVIDER_SITE_OTHER): Payer: Medicare Other | Admitting: Neurology

## 2019-06-02 ENCOUNTER — Other Ambulatory Visit: Payer: Self-pay

## 2019-06-02 ENCOUNTER — Encounter: Payer: Self-pay | Admitting: Neurology

## 2019-06-02 VITALS — BP 142/62 | HR 90 | Temp 97.4°F | Ht 61.0 in | Wt 241.0 lb

## 2019-06-02 DIAGNOSIS — G2581 Restless legs syndrome: Secondary | ICD-10-CM

## 2019-06-02 DIAGNOSIS — M5431 Sciatica, right side: Secondary | ICD-10-CM

## 2019-06-02 DIAGNOSIS — R413 Other amnesia: Secondary | ICD-10-CM | POA: Diagnosis not present

## 2019-06-02 DIAGNOSIS — R251 Tremor, unspecified: Secondary | ICD-10-CM | POA: Diagnosis not present

## 2019-06-02 MED ORDER — ROPINIROLE HCL 3 MG PO TABS: 3.0000 mg | ORAL_TABLET | Freq: Three times a day (TID) | ORAL | 1 refills | Status: DC

## 2019-06-02 NOTE — Progress Notes (Signed)
Reason for visit: Diabetic peripheral neuropathy, gait disorder, right-sided sciatica, mild memory disorder, cerebrovascular disease, restless leg syndrome  Norma [name] is an 69 y.o. female  History of present illness:  Norma [name] is a 69 year old right-handed white female with a history of multiple medical issues.  The patient has significant obesity, she has been followed for restless leg syndrome.  She is on Requip taking 2 mg 3 times daily.  In the past this has been effective but it is tending to wear off with its beneficial effects.  The patient indicates that she sometimes has to take a hot bath or shower to get rid of the discomfort, walking does not improve the symptoms.  The patient has a mild memory disturbance, this has remained stable according to the patient.  She has numbness in the feet, she is having trouble driving because she cannot feel where her feet are, she only drives short distances.  She has had 1 fall since Christmas of 2020, she is not using a cane or a walker.  She has undergone physical therapy in the past for gait training.  She has developed over the last year some back pain and pain down the right leg.  She was seen by Dr. Durward Fortes and underwent MRI of the lumbar spine that did not show evidence of nerve root impingement.  The patient also has a mild tremor involving the arms.  She reports some occasional dizziness and difficulty with clumsiness with the hands and difficulty typing.  She has a history of a prior right parietal stroke.  She returns to the office today for an evaluation.  Past Medical History:  Diagnosis Date  . Anemia   . Anxiety   . Bipolar disorder ([place])   . Bulging lumbar disc    L3-4  . Chronic daily headache   . Chronic low back pain 09/20/2014  . COPD (chronic obstructive pulmonary disease) ([place])   . Degenerative arthritis   . Depression   . Diabetes mellitus, type II ([place])   . Diabetic neuropathy ([place]) 09/16/2018  . DM type 2 with  diabetic peripheral neuropathy ([place]) 09/20/2014  . Dyslipidemia   . Dyspnea   . Gastroparesis   . GERD (gastroesophageal reflux disease)   . Heart murmur   . History of hiatal hernia   . Hypothyroidism   . IBS (irritable bowel syndrome)   . Memory difficulty 09/20/2014  . Morbid obesity ([place])   . Neuropathy   . Obstructive sleep apnea on CPAP   . Restless legs syndrome (RLS) 09/17/2012  . Stroke (cerebrum) ([place]) 05/16/2017   Left parietal  . Urticaria     Past Surgical History:  Procedure Laterality Date  . ABDOMINAL HYSTERECTOMY    . ARTHROSCOPY KNEE W/ DRILLING  06/2011   and Decemer of 2013.  [name] Carpel tunnel  1980's  . CATARACT EXTRACTION W/PHACO Right 03/21/2017   Procedure: CATARACT EXTRACTION PHACO AND INTRAOCULAR LENS PLACEMENT RIGHT EYE;  Surgeon: Baruch Goldmann, MD;  Location: [facility];  Service: Ophthalmology;  Laterality: Right;  CDE: 2.91   . CATARACT EXTRACTION W/PHACO Left 04/18/2017   Procedure: CATARACT EXTRACTION PHACO AND INTRAOCULAR LENS PLACEMENT LEFT EYE;  Surgeon: Baruch Goldmann, MD;  Location: [facility];  Service: Ophthalmology;  Laterality: Left;  left  . CHOLECYSTECTOMY    . COLONOSCOPY WITH PROPOFOL N/A 10/04/2016   Procedure: COLONOSCOPY WITH PROPOFOL;  Surgeon: Rogene Houston, MD;  [facility]: [facility];  Service: Endoscopy;  Laterality: N/A;  11:10  . CORONARY ARTERY BYPASS GRAFT N/A 08/29/2017   Procedure: CORONARY ARTERY BYPASS GRAFTING (CABG) x 3 WITH ENDOSCOPIC HARVESTING OF RIGHT SAPHENOUS VEIN;  Surgeon: Ivin Poot, MD;  Location: [facility];  Service: Open Heart Surgery;  Laterality: N/A;  . ESOPHAGOGASTRODUODENOSCOPY N/A 04/25/2016   Procedure: ESOPHAGOGASTRODUODENOSCOPY (EGD);  Surgeon: Rogene Houston, MD;  [facility];  Service: Endoscopy;  Laterality: N/A;  730  . LEFT HEART CATH AND CORONARY ANGIOGRAPHY N/A 08/27/2017   Procedure: LEFT HEART CATH AND CORONARY ANGIOGRAPHY;  Surgeon: Sherren Mocha, MD;  Location: [facility];   Service: Cardiovascular::  pLAD 95% - p-mLAD 50%, ostD1 90% -pD1 80%; ostOM1 90%; rPDA 80%. EF ~50-55% - HK of dital Anterolateral & Apical wall.  - Rec CVTS c/s  . RECTAL SURGERY     fissure  . SHOULDER SURGERY Left    arthroscopy in March of this year  . TEE WITHOUT CARDIOVERSION N/A 08/29/2017   Procedure: TRANSESOPHAGEAL ECHOCARDIOGRAM (TEE);  Surgeon: Prescott Gum, Collier Salina, MD;  Location: [facility];  Service: Open Heart Surgery;  Laterality: N/A;  . TRANSTHORACIC ECHOCARDIOGRAM  06/06/2017   Mild to moderate reduced EF 40 and 45%.  Anterior septal, inferoseptal and basal to mid inferior hypokinesis.  GR 1 DD.  No significant valvular lesion    Family History  Problem Relation Age of Onset  . Hypertension Mother   . Lymphoma Mother   . Depression Mother   . Arthritis Father   . Alcohol abuse Father   . Hypertension Sister   . Cancer Brother        kidney and lung  . Alcohol abuse Brother   . Alcohol abuse Paternal Uncle   . Alcohol abuse Paternal Grandfather   . Alcohol abuse Paternal Grandmother   . Allergic rhinitis Neg Hx   . Angioedema Neg Hx   . Asthma Neg Hx   . Atopy Neg Hx   . Eczema Neg Hx   . Immunodeficiency Neg Hx   . Urticaria Neg Hx     Social history:  reports that she quit smoking about 48 years ago. Her smoking use included cigarettes. She has never used smokeless tobacco. She reports that she does not drink alcohol or use drugs.    Allergies  Allergen Reactions  . Amoxicillin Anaphylaxis and Other (See Comments)    Has patient had a PCN reaction causing immediate rash, facial/tongue/throat swelling, SOB or lightheadedness with hypotension: Yes Has patient had a PCN reaction causing severe rash involving mucus membranes or skin necrosis: Yes Has patient had a PCN reaction that required hospitalization: Yes Has patient had a PCN reaction occurring within the last 10 years: Yes If all of the above answers are "NO", then may proceed with Cephalosporin use.   [name]  Hydrocodone Anaphylaxis  . Percocet [Oxycodone-Acetaminophen] Other (See Comments)    Causes chest pain /tightness. Pressure pain around her ribs.  . Vancomycin Itching  . Depacon [Valproic Acid] Other (See Comments)    Causes falls     Medications:  Prior to Admission medications   Medication Sig Start Date End Date Taking? Authorizing Provider  aspirin EC 81 MG tablet Take 1 tablet (81 mg total) by mouth daily. 01/20/18  Yes Branch, Alphonse Guild, MD  baclofen (LIORESAL) 10 MG tablet TAKE ONE TABLET BY MOUTH AT BEDTIME. 12/01/18  Yes Kathrynn Ducking, MD  buPROPion (WELLBUTRIN XL) 300 MG 24 hr tablet Take 1 tablet (300 mg total) by mouth every morning.  03/08/19  Yes Cloria Spring, MD  calcium carbonate (TUMS - DOSED IN MG ELEMENTAL CALCIUM) 500 MG chewable tablet Chew 2 tablets by mouth as needed for indigestion or heartburn.    Yes [provider]  carvedilol (COREG) 6.25 MG tablet TAKE ONE TABLET BY MOUTH BID(MORNING ,EVENING) 11/04/18  Yes Branch, Alphonse Guild, MD  cetirizine (ZYRTEC) 10 MG tablet Take 1 tablet tablet 1-2 times daily. Patient taking differently: Take 10 mg by mouth daily. Take 1 tablet tablet 1-2 times daily. 04/25/19  Yes Valentina Shaggy, MD  diclofenac Sodium (VOLTAREN) 1 % GEL Apply topically 4 (four) times daily.   Yes [provider]  diphenhydrAMINE (BENADRYL) 25 mg capsule Take 25 mg by mouth as needed.   Yes [provider]  EPINEPHrine (EPIPEN 2-PAK) 0.3 mg/0.3 mL IJ SOAJ injection Inject 0.3 mg into the muscle once.   Yes [provider]  esomeprazole (NEXIUM) 40 MG capsule Take 40 mg by mouth daily before supper.  02/09/18  Yes [provider]  famotidine (PEPCID) 20 MG tablet Take 1 tablet (20 mg total) by mouth daily. Patient taking differently: Take 20 mg by mouth at bedtime.  04/25/19  Yes Valentina Shaggy, MD  gabapentin (NEURONTIN) 300 MG capsule Take 1 capsule (300 mg total) by mouth 2 (two) times daily.  09/05/17  Yes Gold, Wayne E, PA-C  glipiZIDE (GLUCOTROL XL) 5 MG 24 hr tablet TAKE ONE TABLET BY MOUTH DAILY WITH BREAKFAST 05/20/19  Yes Nida, Marella Chimes, MD  hydrOXYzine (ATARAX/VISTARIL) 10 MG tablet Take 1 tablet (10 mg total) by mouth 3 (three) times daily as needed for itching. 10/01/17  Yes Ivin Poot, MD  lamoTRIgine (LAMICTAL) 150 MG tablet Take 1 tablet (150 mg total) by mouth at bedtime. 03/08/19  Yes Cloria Spring, MD  levothyroxine (SYNTHROID, LEVOTHROID) 100 MCG tablet Take 100 mcg by mouth daily before breakfast.    Yes [provider]  losartan (COZAAR) 25 MG tablet TAKE ONE TABLET BY MOUTH DAILY (MORNING) 04/20/19  Yes Branch, Alphonse Guild, MD  meclizine (ANTIVERT) 25 MG tablet Take 1 tablet (25 mg total) by mouth 3 (three) times daily as needed for dizziness. Patient taking differently: Take 25 mg by mouth daily as needed for dizziness.  03/29/14  Yes Virgel Manifold, MD  metFORMIN (GLUCOPHAGE) 500 MG tablet TAKE ONE TABLET BY MOUTH TWICE DAILY. TAKE WITH A MEAL. (MORNING ,EVENING) 03/19/19  Yes Nida, Marella Chimes, MD  ondansetron (ZOFRAN) 4 MG tablet Take 1 tablet (4 mg total) by mouth 2 (two) times daily as needed for nausea or vomiting. 10/20/18  Yes Rehman, Mechele Dawley, MD  Pediatric Multiple Vitamins (FLINTSTONES MULTIVITAMIN PO) Take by mouth.   Yes [provider]  potassium chloride SA (KLOR-CON) 20 MEQ tablet Take 1 tablet (20 mEq total) by mouth daily as needed (when taking torsemide). 3 x week 02/12/19  Yes Branch, Alphonse Guild, MD  rOPINIRole (REQUIP) 2 MG tablet Take 2 mg by mouth 3 (three) times daily. .   Yes [provider]  temazepam (RESTORIL) 15 MG capsule Take 1 capsule (15 mg total) by mouth at bedtime as needed for sleep. 03/08/19  Yes Cloria Spring, MD  torsemide (DEMADEX) 20 MG tablet TAKE ONE TABLET BY MOUTH ALTERNATING WITH TWO TABLETS daily (PER PT ONE DAILY-EXTRA IN BOTTLE) 04/20/19  Yes Branch, Alphonse Guild, MD  traMADol (ULTRAM)  50 MG tablet Take 100 mg by mouth 4 (four) times daily as needed.  Yes [provider]  venlafaxine XR (EFFEXOR-XR) 150 MG 24 hr capsule TAKE TWO (2) CAPSULES BY MOUTH AT BEDTIME. 03/08/19  Yes Cloria Spring, MD    ROS:  Out of a complete 14 system review of symptoms, the patient complains only of the following symptoms, and all other reviewed systems are negative.  Restless legs Walking difficulty Memory problems Numbness in the feet Tremor  Blood pressure (!) 142/62, pulse 90, temperature (!) 97.4 F (36.3 C), height 5\' 1"  (1.549 m), weight 241 lb (109.3 kg).  Physical Exam  General: The patient is alert and cooperative at the time of the examination.  The patient is markedly obese.  Skin: 1+ edema below the knees is seen bilaterally.   Neurologic Exam  Mental status: The patient is alert and oriented x 3 at the time of the examination. The patient has apparent normal recent and remote memory, with an apparently normal attention span and concentration ability.   Cranial nerves: Facial symmetry is present. Speech is normal, no aphasia or dysarthria is noted. Extraocular movements are full. Visual fields are full.  Motor: The patient has good strength in all 4 extremities.  Sensory examination: Soft touch sensation is symmetric on the face, arms, and legs.  Coordination: The patient has good finger-nose-finger and heel-to-shin bilaterally.  No significant tremor was noted with the upper extremities.  Gait and station: The patient has a slightly wide-based gait, the patient can walk independently, seems to have some limping type quality to the gait with the right leg.  Tandem gait is not attempted.  Romberg is unsteady, the patient wants to go backwards.  Reflexes: Deep tendon reflexes are symmetric, but are depressed bilaterally.   MRI lumbar 09/29/19:  IMPRESSION: 1. No significant change in the appearance of the lumbar spine since 06/10/2014. 2. Minimal  diffuse degenerative disc disease in the lumbar spine with no neural impingement, foraminal stenosis, or spinal stenosis. 3. Slight chronic facet arthritis in the lower lumbar spine.  * MRI scan images were reviewed online. I agree with the written report.    Assessment/Plan:  1.  Diabetic peripheral neuropathy  2.  Right-sided sciatica  3.  Gait disorder  4.  Restless leg syndrome  5.  Tremor  6.  Reported mild memory disturbance  The patient does have a lot of pain in the right leg.  She will be set up for nerve conductions on both legs, EMG on the right leg.  She will be increased on the Requip to 3 mg 3 times daily for the restless leg syndrome.  The patient will follow-up otherwise in 6 months, will follow the memory issues over time.  I have instructed the patient to use a cane when outside of the house.   Jill Alexanders MD 06/02/2019 11:23 AM  [facility] [address] [place], [place] [address]  Phone [phone] Fax [phone]

## 2019-06-04 ENCOUNTER — Ambulatory Visit: Payer: Medicare Other | Admitting: Allergy & Immunology

## 2019-06-07 ENCOUNTER — Telehealth (INDEPENDENT_AMBULATORY_CARE_PROVIDER_SITE_OTHER): Payer: Medicare Other | Admitting: Cardiology

## 2019-06-07 ENCOUNTER — Encounter: Payer: Self-pay | Admitting: Cardiology

## 2019-06-07 VITALS — BP 140/62 | Ht 62.0 in | Wt 238.0 lb

## 2019-06-07 DIAGNOSIS — E782 Mixed hyperlipidemia: Secondary | ICD-10-CM | POA: Diagnosis not present

## 2019-06-07 DIAGNOSIS — Z8249 Family history of ischemic heart disease and other diseases of the circulatory system: Secondary | ICD-10-CM

## 2019-06-07 DIAGNOSIS — I2510 Atherosclerotic heart disease of native coronary artery without angina pectoris: Secondary | ICD-10-CM | POA: Diagnosis not present

## 2019-06-07 DIAGNOSIS — Z951 Presence of aortocoronary bypass graft: Secondary | ICD-10-CM

## 2019-06-07 DIAGNOSIS — I5032 Chronic diastolic (congestive) heart failure: Secondary | ICD-10-CM | POA: Diagnosis not present

## 2019-06-07 NOTE — Progress Notes (Signed)
Virtual Visit via Telephone Note   This visit type was conducted due to national recommendations for restrictions regarding the COVID-19 Pandemic (e.g. social distancing) in an effort to limit this patient's exposure and mitigate transmission in our community.  Due to her co-morbid illnesses, this patient is at least at moderate risk for complications without adequate follow up.  This format is felt to be most appropriate for this patient at this time.  The patient did not have access to video technology/had technical difficulties with video requiring transitioning to audio format only (telephone).  All issues noted in this document were discussed and addressed.  No physical exam could be performed with this format.  Please refer to the patient's chart for her  consent to telehealth for [facility].   Date:  06/07/2019   ID:  Norma [name], DOB 03/17/1951, MRN [id number]  Patient Location: Home Provider Location: Office  PCP:  Maryruth Hancock, MD  Cardiologist:  Carlyle Dolly, MD  Electrophysiologist:  None   Evaluation Performed:  Follow-Up Visit  Chief Complaint:  Follow up visit  History of Present Illness:    Norma [name] is a 69 y.o. female  seen today for follow up of the following medical problems.  1.Chronic diastolic HF [id number] echo VLEF 99991111, grade I diastolic dysfunction.    - weight up to 238 lbs.  - takes torsemide 20-40mg  daily. Mild swelling at times. SOB can be variable.   2. CAD -05/2017 echo LVEF 40-45% - 08/2017 cath for unstable angina: LAD 95%, D1 90%, RPDA 80%, OM 90%.  - CT surgery was consulted. 08/30/17 CABG with sequential SVG to diag and LAD, SVG-OM. - stopped lopressor due to fatigue.  - denies any chest pain  3. Chronic LBBB   4. Hyperlipidemia - off crestor currently due to muscle aches, stoppedshein August. Symptoms mildly improved. - tolerating prasvastatin without issues - Jan 2020 TC 176 HDL 59 TG 114 LDL  96  - leg pains on pravastatin 40mg , we lowered back to 20mg  in 10/2018  - off pravastin symptoms have improved. Side effects on crestor, lipitor also in the past  - upcoming appt with lipid clinic     The patient does not have symptoms concerning for COVID-19 infection (fever, chills, cough, or new shortness of breath).    Past Medical History:  Diagnosis Date  . Anemia   . Anxiety   . Bipolar disorder ([place])   . Bulging lumbar disc    L3-4  . Chronic daily headache   . Chronic low back pain 09/20/2014  . COPD (chronic obstructive pulmonary disease) ([place])   . Degenerative arthritis   . Depression   . Diabetes mellitus, type II ([place])   . Diabetic neuropathy ([place]) 09/16/2018  . DM type 2 with diabetic peripheral neuropathy ([place]) 09/20/2014  . Dyslipidemia   . Dyspnea   . Gastroparesis   . GERD (gastroesophageal reflux disease)   . Heart murmur   . History of hiatal hernia   . Hypothyroidism   . IBS (irritable bowel syndrome)   . Memory difficulty 09/20/2014  . Morbid obesity ([place])   . Neuropathy   . Obstructive sleep apnea on CPAP   . Restless legs syndrome (RLS) 09/17/2012  . Stroke (cerebrum) ([place]) 05/16/2017   Left parietal  . Urticaria    Past Surgical History:  Procedure Laterality Date  . ABDOMINAL HYSTERECTOMY    . ARTHROSCOPY KNEE W/ DRILLING  06/2011   and Decemer of 2013.  [name]  Carpel tunnel  1980's  . CATARACT EXTRACTION W/PHACO Right 03/21/2017   Procedure: CATARACT EXTRACTION PHACO AND INTRAOCULAR LENS PLACEMENT RIGHT EYE;  Surgeon: Baruch Goldmann, MD;  Location: [facility];  Service: Ophthalmology;  Laterality: Right;  CDE: 2.91   . CATARACT EXTRACTION W/PHACO Left 04/18/2017   Procedure: CATARACT EXTRACTION PHACO AND INTRAOCULAR LENS PLACEMENT LEFT EYE;  Surgeon: Baruch Goldmann, MD;  Location: [facility];  Service: Ophthalmology;  Laterality: Left;  left  . CHOLECYSTECTOMY    . COLONOSCOPY WITH PROPOFOL N/A 10/04/2016   Procedure: COLONOSCOPY WITH PROPOFOL;  Surgeon:  Rogene Houston, MD;  Location: [facility];  Service: Endoscopy;  Laterality: N/A;  11:10  . CORONARY ARTERY BYPASS GRAFT N/A 08/29/2017   Procedure: CORONARY ARTERY BYPASS GRAFTING (CABG) x 3 WITH ENDOSCOPIC HARVESTING OF RIGHT SAPHENOUS VEIN;  Surgeon: Ivin Poot, MD;  Location: [facility];  Service: Open Heart Surgery;  Laterality: N/A;  . ESOPHAGOGASTRODUODENOSCOPY N/A 04/25/2016   Procedure: ESOPHAGOGASTRODUODENOSCOPY (EGD);  Surgeon: Rogene Houston, MD;  [facility];  Service: Endoscopy;  Laterality: N/A;  730  . LEFT HEART CATH AND CORONARY ANGIOGRAPHY N/A 08/27/2017   Procedure: LEFT HEART CATH AND CORONARY ANGIOGRAPHY;  Surgeon: Sherren Mocha, MD;  Location: [facility];  Service: Cardiovascular::  pLAD 95% - p-mLAD 50%, ostD1 90% -pD1 80%; ostOM1 90%; rPDA 80%. EF ~50-55% - HK of dital Anterolateral & Apical wall.  - Rec CVTS c/s  . RECTAL SURGERY     fissure  . SHOULDER SURGERY Left    arthroscopy in March of this year  . TEE WITHOUT CARDIOVERSION N/A 08/29/2017   Procedure: TRANSESOPHAGEAL ECHOCARDIOGRAM (TEE);  Surgeon: Prescott Gum, Collier Salina, MD;  Location: [facility];  Service: Open Heart Surgery;  Laterality: N/A;  . TRANSTHORACIC ECHOCARDIOGRAM  06/06/2017   Mild to moderate reduced EF 40 and 45%.  Anterior septal, inferoseptal and basal to mid inferior hypokinesis.  GR 1 DD.  No significant valvular lesion     Current Meds  Medication Sig  . aspirin EC 81 MG tablet Take 1 tablet (81 mg total) by mouth daily.  . baclofen (LIORESAL) 10 MG tablet TAKE ONE TABLET BY MOUTH AT BEDTIME.  [name] buPROPion (WELLBUTRIN XL) 300 MG 24 hr tablet Take 1 tablet (300 mg total) by mouth every morning.  . calcium carbonate (TUMS - DOSED IN MG ELEMENTAL CALCIUM) 500 MG chewable tablet Chew 2 tablets by mouth as needed for indigestion or heartburn.   . carvedilol (COREG) 6.25 MG tablet TAKE ONE TABLET BY MOUTH BID(MORNING ,EVENING)  . cetirizine (ZYRTEC) 10 MG tablet Take 1 tablet tablet  1-2 times daily. (Patient taking differently: Take 10 mg by mouth daily. Take 1 tablet tablet 1-2 times daily.)  . diclofenac Sodium (VOLTAREN) 1 % GEL Apply topically 4 (four) times daily.  . diphenhydrAMINE (BENADRYL) 25 mg capsule Take 25 mg by mouth as needed.  [name] EPINEPHrine (EPIPEN 2-PAK) 0.3 mg/0.3 mL IJ SOAJ injection Inject 0.3 mg into the muscle once.  [name] esomeprazole (NEXIUM) 40 MG capsule Take 40 mg by mouth daily before supper.   . famotidine (PEPCID) 20 MG tablet Take 1 tablet (20 mg total) by mouth daily. (Patient taking differently: Take 20 mg by mouth at bedtime. )  . gabapentin (NEURONTIN) 300 MG capsule Take 1 capsule (300 mg total) by mouth 2 (two) times daily.  [name] glipiZIDE (GLUCOTROL XL) 5 MG 24 hr tablet TAKE ONE TABLET BY MOUTH DAILY WITH BREAKFAST  . hydrOXYzine (ATARAX/VISTARIL)  10 MG tablet Take 1 tablet (10 mg total) by mouth 3 (three) times daily as needed for itching.  . lamoTRIgine (LAMICTAL) 150 MG tablet Take 1 tablet (150 mg total) by mouth at bedtime.  [name] levothyroxine (SYNTHROID, LEVOTHROID) 100 MCG tablet Take 100 mcg by mouth daily before breakfast.   . losartan (COZAAR) 25 MG tablet TAKE ONE TABLET BY MOUTH DAILY (MORNING)  . meclizine (ANTIVERT) 25 MG tablet Take 1 tablet (25 mg total) by mouth 3 (three) times daily as needed for dizziness. (Patient taking differently: Take 25 mg by mouth daily as needed for dizziness. )  . metFORMIN (GLUCOPHAGE) 500 MG tablet TAKE ONE TABLET BY MOUTH TWICE DAILY. TAKE WITH A MEAL. (MORNING ,EVENING)  . ondansetron (ZOFRAN) 4 MG tablet Take 1 tablet (4 mg total) by mouth 2 (two) times daily as needed for nausea or vomiting.  . Pediatric Multiple Vitamins (FLINTSTONES MULTIVITAMIN PO) Take by mouth.  . potassium chloride SA (KLOR-CON) 20 MEQ tablet Take 1 tablet (20 mEq total) by mouth daily as needed (when taking torsemide). 3 x week  . rOPINIRole (REQUIP) 3 MG tablet Take 1 tablet (3 mg total) by mouth in the morning, at noon,  and at bedtime.  . temazepam (RESTORIL) 15 MG capsule Take 1 capsule (15 mg total) by mouth at bedtime as needed for sleep.  [name] torsemide (DEMADEX) 20 MG tablet TAKE ONE TABLET BY MOUTH ALTERNATING WITH TWO TABLETS daily (PER PT ONE DAILY-EXTRA IN BOTTLE)  . traMADol (ULTRAM) 50 MG tablet Take 100 mg by mouth 4 (four) times daily as needed.   . venlafaxine XR (EFFEXOR-XR) 150 MG 24 hr capsule TAKE TWO (2) CAPSULES BY MOUTH AT BEDTIME.     Allergies:   Amoxicillin, Hydrocodone, Percocet [oxycodone-acetaminophen], Vancomycin, and Depacon [valproic acid]   Social History   Tobacco Use  . Smoking status: Former Smoker    Types: Cigarettes    Quit date: 02/04/1971    Years since quitting: 48.3  . Smokeless tobacco: Never Used  . Tobacco comment: smoked 2 cigarettes a day  Substance Use Topics  . Alcohol use: No    Alcohol/week: 0.0 standard drinks  . Drug use: No     Family Hx: The patient's family history includes Alcohol abuse in her brother, father, paternal grandfather, paternal grandmother, and paternal uncle; Arthritis in her father; Cancer in her brother; Depression in her mother; Hypertension in her mother and sister; Lymphoma in her mother. There is no history of Allergic rhinitis, Angioedema, Asthma, Atopy, Eczema, Immunodeficiency, or Urticaria.  ROS:   Please see the history of present illness.     All other systems reviewed and are negative.   Prior CV studies:   The following studies were reviewed today:  03/2016 Lexiscan  Probable normal perfusion and mild soft tissue attenuation (breast) No significant ischemia or evidence for scar  The left ventricular ejection fraction is normal (55-65%).  Low risk scan.  08/2017 cath  RPDA lesion is 80% stenosed.  Ost 1st Mrg to 1st Mrg lesion is 90% stenosed.  Prox LAD lesion is 95% stenosed.  Prox LAD to Mid LAD lesion is 50% stenosed.  Ost 1st Diag lesion is 90% stenosed.  1st Diag lesion is 80% stenosed.  The  left ventricular ejection fraction is 50-55% by visual estimate.  There is mild left ventricular systolic dysfunction.  1. Severe multivessel coronary artery disease with severe stenosis of the proximal LAD/first diagonal bifurcation, first obtuse marginal Leshon Armistead of the circumflex, and PDA  Sumayya Muha of the RCA. 2. Mild segmental LV systolic dysfunction with hypokinesis of the distal anterolateral and apical walls, LVEF estimated at 50 to 55%.  08/2017 TEE  Aortic valve: The valve is trileaflet. No stenosis. No regurgitation.  Mitral valve: Mild regurgitation. Central jet.  Right ventricle: Normal cavity size, wall thickness and ejection fraction.  Pericardium: Trivial pericardial effusion.  Tricuspid valve: No regurgitation  Pulmonic valve: No regurgitation by color doppler.  Left ventricle: Regional wall motion abnormalities present, dyskinetic basal and apical septal wall, hypokinesis of lateral wall and inferoseptal wall. Increased wall thickness. LVEF 35-40%.  No ASD/PFO present  Unable to rule out thrombus in LAA but velocities make thrombus less likely.  05/2017 echo Study Conclusions  - Left ventricle: The cavity size was normal. There was mild concentric hypertrophy. Systolic function was mildly to moderately reduced. The estimated ejection fraction was in the range of 40% to 45%. Hypokinesis of the anteroseptal, inferoseptal and basal to mid inferior myocardium. Doppler parameters are consistent with abnormal left ventricular relaxation (grade 1 diastolic dysfunction). Doppler parameters are consistent with indeterminate ventricular filling pressure. - Aortic valve: Transvalvular velocity was within the normal range. There was no stenosis. There was no regurgitation. - Mitral valve: Transvalvular velocity was within the normal range. There was no evidence for stenosis. There was no regurgitation. - Right ventricle: The cavity size was normal. Wall  thickness was normal. Systolic function was normal. - Tricuspid valve: There was trivial regurgitation. - Global longitudinal strain -11.9%.   01/2018 echo Study Conclusions  - Left ventricle: The cavity size was normal. Wall thickness was increased in a pattern of mild LVH. Systolic function was normal. The estimated ejection fraction was in the range of 50% to 55%. Regional wall motion abnormalities cannot be excluded. Doppler parameters are consistent with abnormal left ventricular relaxation (grade 1 diastolic dysfunction). - Ventricular septum: Septal motion showed abnormal function and dyssynergy. These changes are consistent with a left bundle Johnnay Pleitez block. - Mitral valve: Mildly thickened leaflets .    Labs/Other Tests and Data Reviewed:    EKG:  n/a  Recent Labs: 10/30/2018: Hemoglobin 11.3; Platelets 387 03/05/2019: Magnesium 1.9; TSH 1.49 04/23/2019: ALT 14; BUN 11; Creatinine, Ser 1.18; Potassium 4.4; Sodium 140   Recent Lipid Panel Lab Results  Component Value Date/Time   CHOL 248 (H) 03/05/2019 12:21 PM   TRIG 175 (H) 03/05/2019 12:21 PM   HDL 65 03/05/2019 12:21 PM   CHOLHDL 3.8 03/05/2019 12:21 PM   LDLCALC 152 (H) 03/05/2019 12:21 PM    Wt Readings from Last 3 Encounters:  06/07/19 238 lb (108 kg)  06/02/19 241 lb (109.3 kg)  05/03/19 238 lb (108 kg)     Objective:    Vital Signs:  BP 140/62   Ht 5\' 2"  (1.575 m)   Wt 238 lb (108 kg)   BMI 43.53 kg/m    Normal affect. Normal speech pattern and tone. Comfortable, no apparent distress. No audible signs of SOB or wheezing.   ASSESSMENT & PLAN:    1.Chronic diastolic HF - continue current diuretic, overall controlled  2.CAD - beta blocker stopped due to fatigue.  - no recent symptoms, cntinue to monitor  3. Hyperlipidemia - has not tolerated statins - upcoming app in lipid clinic   COVID-19 Education: The signs and symptoms of COVID-19 were discussed with the  patient and how to seek care for testing (follow up with PCP or arrange E-visit).  The importance of social distancing was discussed today.  Time:   Today, I have spent 21 minutes with the patient with telehealth technology discussing the above problems.     Medication Adjustments/Labs and Tests Ordered: Current medicines are reviewed at length with the patient today.  Concerns regarding medicines are outlined above.   Tests Ordered: No orders of the defined types were placed in this encounter.   Medication Changes: No orders of the defined types were placed in this encounter.   Follow Up:  Either In Person or Virtual in 6 month(s)  Signed, Carlyle Dolly, MD  06/07/2019 11:28 AM    [place]

## 2019-06-07 NOTE — Patient Instructions (Addendum)
Medication Instructions:   Your physician recommends that you continue on your current medications as directed. Please refer to the Current Medication list given to you today.  Labwork:  NONE  Testing/Procedures:  NONE  Follow-Up:  Your physician recommends that you schedule a follow-up appointment in: 6 months (office or virtual). You will receive a reminder letter in the mail in about 4 months reminding you to call and schedule your appointment. If you don't receive this letter, please contact our office.  Any Other Special Instructions Will Be Listed Below (If Applicable).  If you need a refill on your cardiac medications before your next appointment, please call your pharmacy. 

## 2019-06-08 ENCOUNTER — Encounter (HOSPITAL_COMMUNITY): Payer: Self-pay | Admitting: Psychiatry

## 2019-06-08 ENCOUNTER — Ambulatory Visit (INDEPENDENT_AMBULATORY_CARE_PROVIDER_SITE_OTHER): Payer: Medicare Other | Admitting: Psychiatry

## 2019-06-08 ENCOUNTER — Other Ambulatory Visit: Payer: Self-pay

## 2019-06-08 DIAGNOSIS — F331 Major depressive disorder, recurrent, moderate: Secondary | ICD-10-CM

## 2019-06-08 MED ORDER — VENLAFAXINE HCL ER 150 MG PO CP24: ORAL_CAPSULE | ORAL | 2 refills | Status: DC

## 2019-06-08 MED ORDER — BUPROPION HCL ER (XL) 300 MG PO TB24: 300.0000 mg | ORAL_TABLET | Freq: Every morning | ORAL | 2 refills | Status: DC

## 2019-06-08 MED ORDER — TEMAZEPAM 30 MG PO CAPS: 30.0000 mg | ORAL_CAPSULE | Freq: Every evening | ORAL | 2 refills | Status: DC | PRN

## 2019-06-08 MED ORDER — LAMOTRIGINE 150 MG PO TABS: 150.0000 mg | ORAL_TABLET | Freq: Every day | ORAL | 2 refills | Status: DC

## 2019-06-08 NOTE — Progress Notes (Signed)
Virtual Visit via Telephone Note  I connected with Norma [name] on 06/08/19 at  2:40 PM EST by telephone and verified that I am speaking with the correct person using two identifiers.   I discussed the limitations, risks, security and privacy concerns of performing an evaluation and management service by telephone and the availability of in person appointments. I also discussed with the patient that there may be a patient responsible charge related to this service. The patient expressed understanding and agreed to proceed.     I discussed the assessment and treatment plan with the patient. The patient was provided an opportunity to ask questions and all were answered. The patient agreed with the plan and demonstrated an understanding of the instructions.   The patient was advised to call back or seek an in-person evaluation if the symptoms worsen or if the condition fails to improve as anticipated.  I provided 15 minutes of non-face-to-face time during this encounter.   [name], MD  [facility] MD/PA/NP OP Progress Note  06/08/2019 2:59 PM Norma [name]  MRN:  [id number]  Chief Complaint:  Chief Complaint    Depression; Anxiety; Follow-up     HPI: This patient is a 69 year old single white female who lives alone in [place]. 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 She was a patient here but was last seen in 2017. She states that she quit coming because of financial reasons.  Since then the patient has been followed by her primary doctor. She has had other health issues arise and needed CABG. She is having severe problems with knee pain and is trying to lose enough weight to have a knee replacement surgery. She also has chronic back pain. Her psychiatric medications were not really changed much since she last saw me. She states that she still goes to periods of significant depression but most of it sounds situational.  Currently her 42 year old nephew is staying at her  house. She states that he is rude and had been cursing and berating her. She finally put her foot down and got them to stop. She often has conflicts with her parents and even her sister. All of this gets her depressed. After she had been hospitalized for cardiac issues she states that her church did not do anything for her encouraged her to come back and this was very disappointing and hurtful. She has been more isolated lately.  She has been compliant with her medications which include Effexor XR Wellbutrin and Lamictal. She is trying to do a little bit of light housework. Her nephew brought dogs into the home and she enjoys the dogs. She does not currently have any thoughts of self-harm or suicide. She is not sleeping well partly due to chronic pain and partly due to having to urinate because of her blood pressure pill. She tried trazodone 50 mg but it is not helping so I suggested we increase it  The patient returns for follow-up after 3 months.  She tells me that she had coronavirus right around the beginning of the year.  She thinks she got it through get together at church.  She was not very sick and just had mild symptoms for a few days.  She states that overall her mood is pretty good.  She is still not sleeping well and this may be multifactorial.  She has restless leg syndrome as well as probable sciatica and she is supposed to have a nerve conduction study done.  Her neurologist  recently increased her Requip.  She states that temazepam is work better than other sleep aids but it is not working as well as it used to so we probably need to increase the dosage. Visit Diagnosis:    ICD-10-CM   1. Moderate episode of recurrent major depressive disorder ([place])  F33.1     Past Psychiatric History: She was hospitalized around 2001.  She went through a course of ECT and was tried on Serzone Effexor Wellbutrin and Zyprexa  Past Medical History:  Past Medical History:  Diagnosis Date  .  Anemia   . Anxiety   . Bipolar disorder ([place])   . Bulging lumbar disc    L3-4  . Chronic daily headache   . Chronic low back pain 09/20/2014  . COPD (chronic obstructive pulmonary disease) ([place])   . Degenerative arthritis   . Depression   . Diabetes mellitus, type II ([place])   . Diabetic neuropathy ([place]) 09/16/2018  . DM type 2 with diabetic peripheral neuropathy ([place]) 09/20/2014  . Dyslipidemia   . Dyspnea   . Gastroparesis   . GERD (gastroesophageal reflux disease)   . Heart murmur   . History of hiatal hernia   . Hypothyroidism   . IBS (irritable bowel syndrome)   . Memory difficulty 09/20/2014  . Morbid obesity ([place])   . Neuropathy   . Obstructive sleep apnea on CPAP   . Restless legs syndrome (RLS) 09/17/2012  . Stroke (cerebrum) ([place]) 05/16/2017   Left parietal  . Urticaria     Past Surgical History:  Procedure Laterality Date  . ABDOMINAL HYSTERECTOMY    . ARTHROSCOPY KNEE W/ DRILLING  06/2011   and Decemer of 2013.  [name] Carpel tunnel  1980's  . CATARACT EXTRACTION W/PHACO Right 03/21/2017   Procedure: CATARACT EXTRACTION PHACO AND INTRAOCULAR LENS PLACEMENT RIGHT EYE;  Surgeon: Baruch Goldmann, MD;  Location: [facility];  Service: Ophthalmology;  Laterality: Right;  CDE: 2.91   . CATARACT EXTRACTION W/PHACO Left 04/18/2017   Procedure: CATARACT EXTRACTION PHACO AND INTRAOCULAR LENS PLACEMENT LEFT EYE;  Surgeon: Baruch Goldmann, MD;  Location: [facility];  Service: Ophthalmology;  Laterality: Left;  left  . CHOLECYSTECTOMY    . COLONOSCOPY WITH PROPOFOL N/A 10/04/2016   Procedure: COLONOSCOPY WITH PROPOFOL;  Surgeon: Rogene Houston, MD;  [facility];  Service: Endoscopy;  Laterality: N/A;  11:10  . CORONARY ARTERY BYPASS GRAFT N/A 08/29/2017   Procedure: CORONARY ARTERY BYPASS GRAFTING (CABG) x 3 WITH ENDOSCOPIC HARVESTING OF RIGHT SAPHENOUS VEIN;  Surgeon: Ivin Poot, MD;  Location: [facility];  Service: Open Heart Surgery;  Laterality: N/A;  . ESOPHAGOGASTRODUODENOSCOPY N/A  04/25/2016   Procedure: ESOPHAGOGASTRODUODENOSCOPY (EGD);  Surgeon: Rogene Houston, MD;  [facility];  Service: Endoscopy;  Laterality: N/A;  730  . LEFT HEART CATH AND CORONARY ANGIOGRAPHY N/A 08/27/2017   Procedure: LEFT HEART CATH AND CORONARY ANGIOGRAPHY;  Surgeon: Sherren Mocha, MD;  Location: [facility];  Service: Cardiovascular::  pLAD 95% - p-mLAD 50%, ostD1 90% -pD1 80%; ostOM1 90%; rPDA 80%. EF ~50-55% - HK of dital Anterolateral & Apical wall.  - Rec CVTS c/s  . RECTAL SURGERY     fissure  . SHOULDER SURGERY Left    arthroscopy in March of this year  . TEE WITHOUT CARDIOVERSION N/A 08/29/2017   Procedure: TRANSESOPHAGEAL ECHOCARDIOGRAM (TEE);  Surgeon: Prescott Gum, Collier Salina, MD;  Location: [facility];  Service: Open Heart Surgery;  Laterality: N/A;  . TRANSTHORACIC ECHOCARDIOGRAM  06/06/2017   Mild to moderate reduced EF 40 and 45%.  Anterior septal, inferoseptal and basal to mid inferior hypokinesis.  GR 1 DD.  No significant valvular lesion    Family Psychiatric History: see below  Family History:  Family History  Problem Relation Age of Onset  . Hypertension Mother   . Lymphoma Mother   . Depression Mother   . Arthritis Father   . Alcohol abuse Father   . Hypertension Sister   . Cancer Brother        kidney and lung  . Alcohol abuse Brother   . Alcohol abuse Paternal Uncle   . Alcohol abuse Paternal Grandfather   . Alcohol abuse Paternal Grandmother   . Allergic rhinitis Neg Hx   . Angioedema Neg Hx   . Asthma Neg Hx   . Atopy Neg Hx   . Eczema Neg Hx   . Immunodeficiency Neg Hx   . Urticaria Neg Hx     Social History:  Social History   Socioeconomic History  . Marital status: Single    Spouse name: Not on file  . Number of children: 0  . Years of education: 108  . Highest education level: Not on file  Occupational History    Employer: [employer]  Tobacco Use  . Smoking status: Former Smoker    Types: Cigarettes    Quit date:  02/04/1971    Years since quitting: 48.3  . Smokeless tobacco: Never Used  . Tobacco comment: smoked 2 cigarettes a day  Substance and Sexual Activity  . Alcohol use: No    Alcohol/week: 0.0 standard drinks  . Drug use: No  . Sexual activity: Never  Other Topics Concern  . Not on file  Social History Narrative   Patient lives at home alone.    Patient has no children.    Patient has her masters in nursing.    Patient is single.    Patient drinks about 2 glasses of tea daily.   Patient is right handed.   Social Determinants of Health   Financial Resource Strain:   . Difficulty of Paying Living Expenses: Not on file  Food Insecurity:   . Worried About Charity fundraiser in the Last Year: Not on file  . Ran Out of Food in the Last Year: Not on file  Transportation Needs:   . Lack of Transportation (Medical): Not on file  . Lack of Transportation (Non-Medical): Not on file  Physical Activity:   . Days of Exercise per Week: Not on file  . Minutes of Exercise per Session: Not on file  Stress:   . Feeling of Stress : Not on file  Social Connections:   . Frequency of Communication with Friends and Family: Not on file  . Frequency of Social Gatherings with Friends and Family: Not on file  . Attends Religious Services: Not on file  . Active Member of Clubs or Organizations: Not on file  . Attends Archivist Meetings: Not on file  . Marital Status: Not on file    Allergies:  Allergies  Allergen Reactions  . Amoxicillin Anaphylaxis and Other (See Comments)    Has patient had a PCN reaction causing immediate rash, facial/tongue/throat swelling, SOB or lightheadedness with hypotension: Yes Has patient had a PCN reaction causing severe rash involving mucus membranes or skin necrosis: Yes Has patient had a PCN reaction that required hospitalization: Yes Has patient had a PCN reaction occurring within the last 10  years: Yes If all of the above answers are "NO", then may  proceed with Cephalosporin use.   [name] Hydrocodone Anaphylaxis  . Percocet [Oxycodone-Acetaminophen] Other (See Comments)    Causes chest pain /tightness. Pressure pain around her ribs.  . Vancomycin Itching  . Depacon [Valproic Acid] Other (See Comments)    Causes falls     Metabolic Disorder Labs: Lab Results  Component Value Date   HGBA1C 6.8 (H) 03/05/2019   MPG 148 03/05/2019   MPG 136.98 10/30/2018   No results found for: PROLACTIN Lab Results  Component Value Date   CHOL 248 (H) 03/05/2019   TRIG 175 (H) 03/05/2019   HDL 65 03/05/2019   CHOLHDL 3.8 03/05/2019   VLDL 31 (H) 07/10/2016   LDLCALC 152 (H) 03/05/2019   LDLCALC 96 04/24/2018   Lab Results  Component Value Date   TSH 1.49 03/05/2019   TSH 2.671 10/30/2018    Therapeutic Level Labs: No results found for: LITHIUM No results found for: VALPROATE No components found for:  CBMZ  Current Medications: Current Outpatient Medications  Medication Sig Dispense Refill  . aspirin EC 81 MG tablet Take 1 tablet (81 mg total) by mouth daily. 90 tablet 3  . baclofen (LIORESAL) 10 MG tablet TAKE ONE TABLET BY MOUTH AT BEDTIME. 30 tablet 6  . buPROPion (WELLBUTRIN XL) 300 MG 24 hr tablet Take 1 tablet (300 mg total) by mouth every morning. 30 tablet 2  . calcium carbonate (TUMS - DOSED IN MG ELEMENTAL CALCIUM) 500 MG chewable tablet Chew 2 tablets by mouth as needed for indigestion or heartburn.     . carvedilol (COREG) 6.25 MG tablet TAKE ONE TABLET BY MOUTH BID(MORNING ,EVENING) 180 tablet 3  . cetirizine (ZYRTEC) 10 MG tablet Take 1 tablet tablet 1-2 times daily. (Patient taking differently: Take 10 mg by mouth daily. Take 1 tablet tablet 1-2 times daily.) 60 tablet 5  . diclofenac Sodium (VOLTAREN) 1 % GEL Apply topically 4 (four) times daily.    . diphenhydrAMINE (BENADRYL) 25 mg capsule Take 25 mg by mouth as needed.    [name] EPINEPHrine (EPIPEN 2-PAK) 0.3 mg/0.3 mL IJ SOAJ injection Inject 0.3 mg into the muscle once.     [name] esomeprazole (NEXIUM) 40 MG capsule Take 40 mg by mouth daily before supper.   12  . famotidine (PEPCID) 20 MG tablet Take 1 tablet (20 mg total) by mouth daily. (Patient taking differently: Take 20 mg by mouth at bedtime. ) 30 tablet 5  . gabapentin (NEURONTIN) 300 MG capsule Take 1 capsule (300 mg total) by mouth 2 (two) times daily.    [name] glipiZIDE (GLUCOTROL XL) 5 MG 24 hr tablet TAKE ONE TABLET BY MOUTH DAILY WITH BREAKFAST 90 tablet 0  . hydrOXYzine (ATARAX/VISTARIL) 10 MG tablet Take 1 tablet (10 mg total) by mouth 3 (three) times daily as needed for itching. 30 tablet 0  . lamoTRIgine (LAMICTAL) 150 MG tablet Take 1 tablet (150 mg total) by mouth at bedtime. 30 tablet 2  . levothyroxine (SYNTHROID, LEVOTHROID) 100 MCG tablet Take 100 mcg by mouth daily before breakfast.     . losartan (COZAAR) 25 MG tablet TAKE ONE TABLET BY MOUTH DAILY (MORNING) 90 tablet 1  . meclizine (ANTIVERT) 25 MG tablet Take 1 tablet (25 mg total) by mouth 3 (three) times daily as needed for dizziness. (Patient taking differently: Take 25 mg by mouth daily as needed for dizziness. ) 30 tablet 0  . metFORMIN (GLUCOPHAGE) 500 MG tablet  TAKE ONE TABLET BY MOUTH TWICE DAILY. TAKE WITH A MEAL. (MORNING ,EVENING) 180 tablet 0  . ondansetron (ZOFRAN) 4 MG tablet Take 1 tablet (4 mg total) by mouth 2 (two) times daily as needed for nausea or vomiting. 30 tablet 1  . Pediatric Multiple Vitamins (FLINTSTONES MULTIVITAMIN PO) Take by mouth.    . potassium chloride SA (KLOR-CON) 20 MEQ tablet Take 1 tablet (20 mEq total) by mouth daily as needed (when taking torsemide). 3 x week 30 tablet 2  . rOPINIRole (REQUIP) 3 MG tablet Take 1 tablet (3 mg total) by mouth in the morning, at noon, and at bedtime. 270 tablet 1  . temazepam (RESTORIL) 30 MG capsule Take 1 capsule (30 mg total) by mouth at bedtime as needed for sleep. 30 capsule 2  . torsemide (DEMADEX) 20 MG tablet TAKE ONE TABLET BY MOUTH ALTERNATING WITH TWO TABLETS daily  (PER PT ONE DAILY-EXTRA IN BOTTLE) 135 tablet 3  . traMADol (ULTRAM) 50 MG tablet Take 100 mg by mouth 4 (four) times daily as needed.     . venlafaxine XR (EFFEXOR-XR) 150 MG 24 hr capsule TAKE TWO (2) CAPSULES BY MOUTH AT BEDTIME. 60 capsule 2   No current facility-administered medications for this visit.     Musculoskeletal: Strength & Muscle Tone: within normal limits Gait & Station: normal Patient leans: N/A  Psychiatric Specialty Exam: Review of Systems  Musculoskeletal: Positive for back pain.  Psychiatric/Behavioral: Positive for sleep disturbance.  All other systems reviewed and are negative.   There were no vitals taken for this visit.There is no height or weight on file to calculate BMI.  General Appearance: NA  Eye Contact:  NA  Speech:  Clear and Coherent  Volume:  Normal  Mood:  Euthymic  Affect:  NA  Thought Process:  Goal Directed  Orientation:  Full (Time, Place, and Person)  Thought Content: Rumination   Suicidal Thoughts:  No  Homicidal Thoughts:  No  Memory:  Immediate;   Good Recent;   Good Remote;   Good  Judgement:  Good  Insight:  Fair  Psychomotor Activity:  Decreased  Concentration:  Concentration: Good and Attention Span: Good  Recall:  Good  Fund of Knowledge: Good  Language: Good  Akathisia:  No  Handed:  Right  AIMS (if indicated): not done  Assets:  Communication Skills Desire for Improvement Resilience Social Support Talents/Skills  ADL's:  Intact  Cognition: WNL  Sleep:  Fair   Screenings: Mini-Mental     Office Visit from 02/18/2018 in [facility] Office Visit from 09/17/2017 in [facility]  Total Score (max 30 points )  28  28    PHQ2-9     Counselor from 05/17/2019 in [facility] Office Visit from 09/11/2017 in [facility] Office Visit from 04/22/2017 in [facility] Office Visit from 12/06/2016 in  [facility] Office Visit from 07/16/2016 in [facility]  PHQ-2 Total Score  1  0  0  0  0       Assessment and Plan:  This patient is a 69 year old female with a history of depression anxiety mood swings and difficulty sleeping.  Since her sleep is not that much improved we will increase temazepam to 30 mg at bedtime.  She will continue Effexor XR 300 mg at bedtime as well as Wellbutrin XL 300 mg daily for depression at bedtime.  She will continue Lamictal 150 mg daily for mood swings.  She will return to see me in 3 months  [name], MD 06/08/2019, 2:59 PM

## 2019-06-10 ENCOUNTER — Other Ambulatory Visit: Payer: Self-pay | Admitting: "Endocrinology

## 2019-06-15 DIAGNOSIS — D649 Anemia, unspecified: Secondary | ICD-10-CM | POA: Diagnosis not present

## 2019-06-15 LAB — CBC
HCT: 34.4 % — ABNORMAL LOW (ref 35.0–45.0)
Hemoglobin: 11.6 g/dL — ABNORMAL LOW (ref 11.7–15.5)
MCH: 30.3 pg (ref 27.0–33.0)
MCHC: 33.7 g/dL (ref 32.0–36.0)
MCV: 89.8 fL (ref 80.0–100.0)
MPV: 10.2 fL (ref 7.5–12.5)
Platelets: 460 10*3/uL — ABNORMAL HIGH (ref 140–400)
RBC: 3.83 10*6/uL (ref 3.80–5.10)
RDW: 14 % (ref 11.0–15.0)
WBC: 11 10*3/uL — ABNORMAL HIGH (ref 3.8–10.8)

## 2019-06-16 ENCOUNTER — Ambulatory Visit (INDEPENDENT_AMBULATORY_CARE_PROVIDER_SITE_OTHER): Payer: Medicare Other | Admitting: Orthopaedic Surgery

## 2019-06-16 ENCOUNTER — Encounter: Payer: Self-pay | Admitting: Orthopaedic Surgery

## 2019-06-16 ENCOUNTER — Other Ambulatory Visit: Payer: Self-pay

## 2019-06-16 VITALS — BP 118/51 | HR 74 | Ht 61.0 in | Wt 240.0 lb

## 2019-06-16 DIAGNOSIS — M25561 Pain in right knee: Secondary | ICD-10-CM | POA: Diagnosis not present

## 2019-06-16 DIAGNOSIS — G8929 Other chronic pain: Secondary | ICD-10-CM | POA: Diagnosis not present

## 2019-06-16 DIAGNOSIS — M25562 Pain in left knee: Secondary | ICD-10-CM

## 2019-06-16 DIAGNOSIS — I2510 Atherosclerotic heart disease of native coronary artery without angina pectoris: Secondary | ICD-10-CM | POA: Diagnosis not present

## 2019-06-16 NOTE — Progress Notes (Signed)
I need papers signed  Patient of Dr. Durward Fortes who needs disability forms signed.  He has been out of town.  She has bilateral knee pain.  I have reviewed his notes and Dr. Luan Pulling notes.  She declines injections today.  She wants to stay on her current medicine.  Encounter Diagnoses  Name Primary?  . Chronic pain of right knee Yes  . Chronic pain of left knee    See Dr. Durward Fortes as needed.  Electronically Signed Sanjuana Kava, MD 3/3/202110:46 AM

## 2019-06-18 ENCOUNTER — Other Ambulatory Visit: Payer: Self-pay | Admitting: Neurology

## 2019-06-18 ENCOUNTER — Other Ambulatory Visit: Payer: Self-pay | Admitting: Cardiology

## 2019-07-08 ENCOUNTER — Encounter: Payer: Self-pay | Admitting: Internal Medicine

## 2019-07-08 ENCOUNTER — Other Ambulatory Visit: Payer: Self-pay

## 2019-07-08 ENCOUNTER — Ambulatory Visit (INDEPENDENT_AMBULATORY_CARE_PROVIDER_SITE_OTHER): Payer: Medicare Other | Admitting: Internal Medicine

## 2019-07-08 VITALS — BP 128/62 | HR 89 | Temp 96.6°F | Ht 61.0 in | Wt 241.6 lb

## 2019-07-08 DIAGNOSIS — T466X5A Adverse effect of antihyperlipidemic and antiarteriosclerotic drugs, initial encounter: Secondary | ICD-10-CM | POA: Diagnosis not present

## 2019-07-08 DIAGNOSIS — M791 Myalgia, unspecified site: Secondary | ICD-10-CM | POA: Diagnosis not present

## 2019-07-08 DIAGNOSIS — I2510 Atherosclerotic heart disease of native coronary artery without angina pectoris: Secondary | ICD-10-CM

## 2019-07-08 DIAGNOSIS — E782 Mixed hyperlipidemia: Secondary | ICD-10-CM

## 2019-07-08 NOTE — Progress Notes (Signed)
LIPID CLINIC CONSULT NOTE  Chief Complaint:  Manage dyslipidemia  Primary Care Physician: Maryruth Hancock, MD  Primary Cardiologist:  Carlyle Dolly, MD  HPI:  Norma [name] is a 69 y.o. female who is being seen today for the evaluation of dyslipidemia at the request of Branch, Alphonse Guild, MD.  This is a pleasant 69 year old female kindly referred by Dr. Harl Bowie for evaluation and management of dyslipidemia.  Past medical history significant for coronary disease with coronary artery bypass grafting in May 2019.  Unfortunately, she is struggled with statin intolerance, having tried atorvastatin, rosuvastatin, simvastatin and pravastatin, all worsening her myalgias.  She also struggles with degenerative arthritis, type 2 diabetes, neuropathy and diabetes and other multiple medical problems.  More recently her lipids were assessed showing total cholesterol 248, triglycerides 175, HDL 65 and LDL 152.  We discussed several treatment options for her today however presented PCSK9 inhibitor which may be the best tolerated option to reach her target LDL less than 70.  PMHx:  Past Medical History:  Diagnosis Date  . Anemia   . Anxiety   . Bipolar disorder ([place])   . Bulging lumbar disc    L3-4  . Chronic daily headache   . Chronic low back pain 09/20/2014  . COPD (chronic obstructive pulmonary disease) ([place])   . Degenerative arthritis   . Depression   . Diabetes mellitus, type II ([place])   . Diabetic neuropathy ([place]) 09/16/2018  . DM type 2 with diabetic peripheral neuropathy ([place]) 09/20/2014  . Dyslipidemia   . Dyspnea   . Gastroparesis   . GERD (gastroesophageal reflux disease)   . Heart murmur   . History of hiatal hernia   . Hypothyroidism   . IBS (irritable bowel syndrome)   . Memory difficulty 09/20/2014  . Morbid obesity ([place])   . Neuropathy   . Obstructive sleep apnea on CPAP   . Restless legs syndrome (RLS) 09/17/2012  . Stroke (cerebrum) ([place]) 05/16/2017   Left parietal  .  Urticaria     Past Surgical History:  Procedure Laterality Date  . ABDOMINAL HYSTERECTOMY    . ARTHROSCOPY KNEE W/ DRILLING  06/2011   and Decemer of 2013.  [name] Carpel tunnel  1980's  . CATARACT EXTRACTION W/PHACO Right 03/21/2017   Procedure: CATARACT EXTRACTION PHACO AND INTRAOCULAR LENS PLACEMENT RIGHT EYE;  Surgeon: Baruch Goldmann, MD;  Location: [facility];  Service: Ophthalmology;  Laterality: Right;  CDE: 2.91   . CATARACT EXTRACTION W/PHACO Left 04/18/2017   Procedure: CATARACT EXTRACTION PHACO AND INTRAOCULAR LENS PLACEMENT LEFT EYE;  Surgeon: Baruch Goldmann, MD;  Location: [facility];  Service: Ophthalmology;  Laterality: Left;  left  . CHOLECYSTECTOMY    . COLONOSCOPY WITH PROPOFOL N/A 10/04/2016   Procedure: COLONOSCOPY WITH PROPOFOL;  Surgeon: Rogene Houston, MD;  Location: [facility];  Service: Endoscopy;  Laterality: N/A;  11:10  . CORONARY ARTERY BYPASS GRAFT N/A 08/29/2017   Procedure: CORONARY ARTERY BYPASS GRAFTING (CABG) x 3 WITH ENDOSCOPIC HARVESTING OF RIGHT SAPHENOUS VEIN;  Surgeon: Ivin Poot, MD;  Location: [facility];  Service: Open Heart Surgery;  Laterality: N/A;  . ESOPHAGOGASTRODUODENOSCOPY N/A 04/25/2016   Procedure: ESOPHAGOGASTRODUODENOSCOPY (EGD);  Surgeon: Rogene Houston, MD;  [facility];  Service: Endoscopy;  Laterality: N/A;  730  . LEFT HEART CATH AND CORONARY ANGIOGRAPHY N/A 08/27/2017   Procedure: LEFT HEART CATH AND CORONARY ANGIOGRAPHY;  Surgeon: Sherren Mocha, MD;  Location: [facility];  Service: Cardiovascular::  pLAD 95% - p-mLAD 50%, ostD1 90% -pD1 80%; ostOM1 90%; rPDA 80%. EF ~50-55% - HK of dital Anterolateral & Apical wall.  - Rec CVTS c/s  . RECTAL SURGERY     fissure  . SHOULDER SURGERY Left    arthroscopy in March of this year  . TEE WITHOUT CARDIOVERSION N/A 08/29/2017   Procedure: TRANSESOPHAGEAL ECHOCARDIOGRAM (TEE);  Surgeon: Prescott Gum, Collier Salina, MD;  Location: [facility];  Service: Open Heart Surgery;  Laterality: N/A;  .  TRANSTHORACIC ECHOCARDIOGRAM  06/06/2017   Mild to moderate reduced EF 40 and 45%.  Anterior septal, inferoseptal and basal to mid inferior hypokinesis.  GR 1 DD.  No significant valvular lesion    FAMHx:  Family History  Problem Relation Age of Onset  . Hypertension Mother   . Lymphoma Mother   . Depression Mother   . Arthritis Father   . Alcohol abuse Father   . Hypertension Sister   . Cancer Brother        kidney and lung  . Alcohol abuse Brother   . Alcohol abuse Paternal Uncle   . Alcohol abuse Paternal Grandfather   . Alcohol abuse Paternal Grandmother   . Allergic rhinitis Neg Hx   . Angioedema Neg Hx   . Asthma Neg Hx   . Atopy Neg Hx   . Eczema Neg Hx   . Immunodeficiency Neg Hx   . Urticaria Neg Hx     SOCHx:   reports that she quit smoking about 48 years ago. Her smoking use included cigarettes. She has never used smokeless tobacco. She reports that she does not drink alcohol or use drugs.  ALLERGIES:  Allergies  Allergen Reactions  . Amoxicillin Anaphylaxis and Other (See Comments)    Has patient had a PCN reaction causing immediate rash, facial/tongue/throat swelling, SOB or lightheadedness with hypotension: Yes Has patient had a PCN reaction causing severe rash involving mucus membranes or skin necrosis: Yes Has patient had a PCN reaction that required hospitalization: Yes Has patient had a PCN reaction occurring within the last 10 years: Yes If all of the above answers are "NO", then may proceed with Cephalosporin use.   [name] Hydrocodone Anaphylaxis  . Percocet [Oxycodone-Acetaminophen] Other (See Comments)    Causes chest pain /tightness. Pressure pain around her ribs.  . Vancomycin Itching  . Depacon [Valproic Acid] Other (See Comments)    Causes falls     ROS: Pertinent items noted in HPI and remainder of comprehensive ROS otherwise negative.  HOME MEDS: Current Outpatient Medications on File Prior to Visit  Medication Sig Dispense Refill  .  aspirin EC 81 MG tablet Take 1 tablet (81 mg total) by mouth daily. 90 tablet 3  . baclofen (LIORESAL) 10 MG tablet TAKE ONE TABLET BY MOUTH AT BEDTIME. 30 tablet 6  . buPROPion (WELLBUTRIN XL) 300 MG 24 hr tablet Take 1 tablet (300 mg total) by mouth every morning. 30 tablet 2  . calcium carbonate (TUMS - DOSED IN MG ELEMENTAL CALCIUM) 500 MG chewable tablet Chew 2 tablets by mouth as needed for indigestion or heartburn.     . carvedilol (COREG) 6.25 MG tablet TAKE ONE TABLET BY MOUTH BID(MORNING ,EVENING) 180 tablet 3  . cetirizine (ZYRTEC) 10 MG tablet Take 1 tablet tablet 1-2 times daily. (Patient taking differently: Take 10 mg by mouth daily. Take 1 tablet tablet 1-2 times daily.) 60 tablet 5  . diclofenac Sodium (VOLTAREN) 1 % GEL Apply topically 4 (four) times daily.    [name]  diphenhydrAMINE (BENADRYL) 25 mg capsule Take 25 mg by mouth as needed.    [name] EPINEPHrine (EPIPEN 2-PAK) 0.3 mg/0.3 mL IJ SOAJ injection Inject 0.3 mg into the muscle once.    [name] esomeprazole (NEXIUM) 40 MG capsule Take 40 mg by mouth daily before supper.   12  . famotidine (PEPCID) 20 MG tablet Take 1 tablet (20 mg total) by mouth daily. (Patient taking differently: Take 20 mg by mouth at bedtime. ) 30 tablet 5  . gabapentin (NEURONTIN) 300 MG capsule Take 1 capsule (300 mg total) by mouth 2 (two) times daily. (Patient taking differently: Take 300 mg by mouth 3 (three) times daily. )    . glipiZIDE (GLUCOTROL XL) 5 MG 24 hr tablet TAKE ONE TABLET BY MOUTH DAILY WITH BREAKFAST 90 tablet 0  . lamoTRIgine (LAMICTAL) 150 MG tablet Take 1 tablet (150 mg total) by mouth at bedtime. 30 tablet 2  . levothyroxine (SYNTHROID, LEVOTHROID) 100 MCG tablet Take 100 mcg by mouth daily before breakfast.     . losartan (COZAAR) 25 MG tablet TAKE ONE TABLET BY MOUTH DAILY (MORNING) 90 tablet 1  . meclizine (ANTIVERT) 25 MG tablet Take 1 tablet (25 mg total) by mouth 3 (three) times daily as needed for dizziness. (Patient taking differently:  Take 25 mg by mouth daily as needed for dizziness. ) 30 tablet 0  . metFORMIN (GLUCOPHAGE) 500 MG tablet TAKE ONE TABLET BY MOUTH TWICE DAILY. TAKE WITH A MEAL. (MORNING ,EVENING) 180 tablet 0  . ondansetron (ZOFRAN) 4 MG tablet Take 1 tablet (4 mg total) by mouth 2 (two) times daily as needed for nausea or vomiting. 30 tablet 1  . Pediatric Multiple Vitamins (FLINTSTONES MULTIVITAMIN PO) Take by mouth.    . potassium chloride SA (KLOR-CON) 20 MEQ tablet TAKE ONE TABLET BY MOUTH EVERY DAY (MORNING-MON, WED,FRI ,AM PER PT) 90 tablet 2  . rOPINIRole (REQUIP) 3 MG tablet Take 1 tablet (3 mg total) by mouth in the morning, at noon, and at bedtime. 270 tablet 1  . temazepam (RESTORIL) 30 MG capsule Take 1 capsule (30 mg total) by mouth at bedtime as needed for sleep. 30 capsule 2  . torsemide (DEMADEX) 20 MG tablet TAKE ONE TABLET BY MOUTH ALTERNATING WITH TWO TABLETS daily (PER PT ONE DAILY-EXTRA IN BOTTLE) 135 tablet 3  . traMADol (ULTRAM) 50 MG tablet Take 100 mg by mouth 4 (four) times daily as needed.     . venlafaxine XR (EFFEXOR-XR) 150 MG 24 hr capsule TAKE TWO (2) CAPSULES BY MOUTH AT BEDTIME. 60 capsule 2   No current facility-administered medications on file prior to visit.    LABS/IMAGING: No results found for this or any previous visit (from the past 48 hour(s)). No results found.  LIPID PANEL:    Component Value Date/Time   CHOL 248 (H) 03/05/2019 1221   TRIG 175 (H) 03/05/2019 1221   HDL 65 03/05/2019 1221   CHOLHDL 3.8 03/05/2019 1221   VLDL 31 (H) 07/10/2016 0934   LDLCALC 152 (H) 03/05/2019 1221    WEIGHTS: Wt Readings from Last 3 Encounters:  07/08/19 241 lb 9.6 oz (109.6 kg)  06/16/19 240 lb (108.9 kg)  06/07/19 238 lb (108 kg)    VITALS: BP 128/62   Pulse 89   Temp (!) 96.6 F (35.9 C)   Ht 5\' 1"  (1.549 m)   Wt 241 lb 9.6 oz (109.6 kg)   SpO2 97%   BMI 45.65 kg/m   EXAM: Deferred  EKG:  Deferred  ASSESSMENT: 1. Mixed dyslipidemia, goal LDL  <70 2. CAD status post CABG x 3 (08/2017) 3. Statin myalgias  PLAN: 1.   Ms. Renter has experienced recurrent myalgias on numerous statins and has been intolerant to them.  Her target LDL is less than 70 but she needs a more than 50% reduction to reach a target.  She is a good candidate for PCSK9 inhibitor.  Would recommend we proceed in that direction and will get prior authorization for therapy with Repatha.  Plan repeat lipids in about 3 months and follow-up afterwards.  Thanks again for the kind referral.  Pixie Casino, MD, FACC, [place] Director of the [facility] Lipid Disorders &  Cardiovascular Risk Reduction Clinic Diplomate of the American Board of Clinical Lipidology Attending Cardiologist  Direct Dial: [phone]  Fax: [phone]  Website:  [email or web address] 07/08/2019, 12:53 PM

## 2019-07-08 NOTE — Patient Instructions (Signed)
Medication Instructions:  Dr. Debara Pickett recommends Repatha 140mg /mL (PCSK9). This is an injectable cholesterol medication self-administered once every 14 days. This medication will need prior approval with your insurance company, which we will work on. If the medication is not approved initially, we may need to do an appeal with your insurance. We will keep you updated on this process.   Administer medication in area of fatty tissue such as abdomen, outer thigh, back up of arm - and rotate site with each injection Store medication in refrigerator until ready to administer - allow to sit at room temp for 30 mins - 1 hour prior to injection Dispose of medication in a SHARPS container - your pharmacy should be able to direct you on this and proper disposal   If you need co-pay assistance, please look into the program at healthwellfoundation.org >> disease funds >> hypercholesterolemia. This is an online application or you can call to complete.   If you need a co-pay card for Repatha: [email or web address] >> paying for Repatha If you need a co-pay card for Praluent: WedMap.it >> starting & paying for Praluent  *If you need a refill on your cardiac medications before your next appointment, please call your pharmacy*   Lab Work: FASTING lab work in 3-4 months to check cholesterol   If you have labs (blood work) drawn today and your tests are completely normal, you will receive your results only by: [name] MyChart Message (if you have MyChart) OR . A paper copy in the mail If you have any lab test that is abnormal or we need to change your treatment, we will call you to review the results.   Testing/Procedures: NONE   Follow-Up: At [facility], you and your health needs are our priority.  As part of our continuing mission to provide you with exceptional heart care, we have created designated Provider Care Teams.  These Care Teams include your primary Cardiologist (physician) and Advanced Practice Providers  (APPs -  Physician Assistants and Nurse Practitioners) who all work together to provide you with the care you need, when you need it.  We recommend signing up for the patient portal called "MyChart".  Sign up information is provided on this After Visit Summary.  MyChart is used to connect with patients for Virtual Visits (Telemedicine).  Patients are able to view lab/test results, encounter notes, upcoming appointments, etc.  Non-urgent messages can be sent to your provider as well.   To learn more about what you can do with MyChart, go to [email or web address].    Your next appointment:   4 month(s) - lipid clinic  The format for your next appointment:   Either In Person or Virtual  Provider:   K. Mali Hilty, MD   Other Instructions

## 2019-07-09 ENCOUNTER — Telehealth: Payer: Self-pay | Admitting: Internal Medicine

## 2019-07-09 NOTE — Telephone Encounter (Signed)
PA for Repatha [employer] submitted via [place] request has been approved PA Case: [id number], Status: Approved, Coverage Starts on: 07/09/2019 12:00:00 AM, Coverage Ends on: 01/05/2020 12:00:00 AM

## 2019-07-09 NOTE — Telephone Encounter (Signed)
-----   Message from Fidel Levy, RN sent at 07/08/2019 11:50 AM EDT ----- Regarding: repatha PA [employer] ID: [id number] [id number] PCN: [id number] RxGrp: [id number]  MyChart communication is OK

## 2019-07-13 ENCOUNTER — Ambulatory Visit (INDEPENDENT_AMBULATORY_CARE_PROVIDER_SITE_OTHER): Payer: Medicare Other | Admitting: Neurology

## 2019-07-13 ENCOUNTER — Encounter: Payer: Self-pay | Admitting: Allergy & Immunology

## 2019-07-13 ENCOUNTER — Other Ambulatory Visit: Payer: Self-pay

## 2019-07-13 ENCOUNTER — Encounter: Payer: Self-pay | Admitting: Neurology

## 2019-07-13 ENCOUNTER — Other Ambulatory Visit (HOSPITAL_COMMUNITY): Payer: Self-pay | Admitting: Psychiatry

## 2019-07-13 DIAGNOSIS — M5431 Sciatica, right side: Secondary | ICD-10-CM

## 2019-07-13 DIAGNOSIS — E1142 Type 2 diabetes mellitus with diabetic polyneuropathy: Secondary | ICD-10-CM

## 2019-07-13 MED ORDER — GABAPENTIN 300 MG PO CAPS: ORAL_CAPSULE | ORAL | 3 refills | Status: DC

## 2019-07-13 NOTE — Procedures (Signed)
     HISTORY:  Norma [name] is a 69 year old patient with a history of diabetes and obesity with a problem with pain down both legs, sometimes worse on the right and sometimes worse on the left.  The patient has some numbness in the legs, history of restless leg syndrome.  She is being evaluated for a possible neuropathy or a lumbosacral radiculopathy.  NERVE CONDUCTION STUDIES:  Nerve conduction studies were performed on both the lower extremities.  The distal motor latencies for the peroneal and posterior tibial nerves were within normal limits bilaterally with low motor amplitudes for the left peroneal nerve and for the right posterior tibial nerve, normal for the left posterior tibial nerve and the right peroneal nerve.  Slowing was seen for the peroneal nerves bilaterally with borderline nerve conduction velocities for the right posterior tibial nerve, normal for the left posterior tibial nerve.  No response was seen for the sural or peroneal nerves on either side.  The F-wave latencies for the posterior tibial nerves were normal bilaterally.  EMG STUDIES:  EMG study was performed on the left lower extremity:  The tibialis anterior muscle reveals 2 to 4K motor units with full recruitment. No fibrillations or positive waves were seen. The peroneus tertius muscle reveals 2 to 4K motor units with reduced recruitment. No fibrillations or positive waves were seen. The medial gastrocnemius muscle reveals 1 to 3K motor units with full recruitment. No fibrillations or positive waves were seen. The vastus lateralis muscle reveals 2 to 4K motor units with full recruitment. No fibrillations or positive waves were seen. The iliopsoas muscle reveals 2 to 4K motor units with full recruitment. No fibrillations or positive waves were seen. The biceps femoris muscle (long head) reveals 2 to 4K motor units with full recruitment. No fibrillations or positive waves were seen. The lumbosacral paraspinal  muscles were tested at 3 levels, and revealed no abnormalities of insertional activity at all 3 levels tested. There was good relaxation.   IMPRESSION:  Nerve conduction studies done on both lower extremities shows evidence of a peripheral neuropathy that is primarily axonal in nature of moderate severity.  This may be related to the diabetes.  The EMG evaluation of the left lower extremity does not show evidence of an overlying lumbosacral radiculopathy.  Jill Alexanders MD 07/13/2019 1:46 PM  [facility] [address] [place], [place] [address]  Phone [phone] Fax [phone]

## 2019-07-13 NOTE — Progress Notes (Signed)
Please refer to EMG and nerve conduction procedure note.  

## 2019-07-13 NOTE — Progress Notes (Addendum)
Ms. Donalds comes in today for EMG nerve conduction study.  This shows evidence of a diabetic peripheral neuropathy, EMG on the left leg was unremarkable without evidence of an overlying lumbosacral radiculopathy.  The patient will have alternating pain in the right and left leg.  In the past, she claims that she was on up to 1200 mg 3 times daily of gabapentin but was reduced on the dose.  Gabapentin did help her previously.  We will go up on the medication taking 300 mg in the morning and midday and 600 mg in the evening.  A prescription was sent in.  The gabapentin may also help her restless leg symptoms.    [place]    Nerve / Sites Muscle Latency Ref. Amplitude Ref. Rel Amp Segments Distance Velocity Ref. Area    ms ms mV mV %  cm m/s m/s mVms  R Peroneal - EDB     Ankle EDB 5.0 ?6.5 3.5 ?2.0 100 Ankle - EDB 9   13.3     Fib head EDB 11.8  2.9  83 Fib head - Ankle 26 39 ?44 11.7     Pop fossa EDB 14.3  2.5  85.7 Pop fossa - Fib head 10 39 ?44 9.6         Pop fossa - Ankle      L Peroneal - EDB     Ankle EDB 4.9 ?6.5 1.2 ?2.0 100 Ankle - EDB 9   3.0     Fib head EDB 11.6  0.9  75.2 Fib head - Ankle 26 39 ?44 2.2     Pop fossa EDB 14.2  0.8  89.9 Pop fossa - Fib head 10 38 ?44 1.9         Pop fossa - Ankle      R Tibial - AH     Ankle AH 3.7 ?5.8 3.8 ?4.0 100 Ankle - AH 9   9.3     Pop fossa AH 12.5  3.7  99.2 Pop fossa - Ankle 36 41 ?41 9.2  L Tibial - AH     Ankle AH 4.9 ?5.8 4.6 ?4.0 100 Ankle - AH 9   8.9     Pop fossa AH 12.9  4.2  91.5 Pop fossa - Ankle 37 46 ?41 9.9             SNC    Nerve / Sites Rec. Site Peak Lat Ref.  Amp Ref. Segments Distance    ms ms V V  cm  R Sural - Ankle (Calf)     Calf Ankle NR ?4.4 NR ?6 Calf - Ankle 14  L Sural - Ankle (Calf)     Calf Ankle NR ?4.4 NR ?6 Calf - Ankle 14  R Superficial peroneal - Ankle     Lat leg Ankle NR ?4.4 NR ?6 Lat leg - Ankle 14  L Superficial peroneal - Ankle     Lat leg Ankle NR ?4.4 NR ?6 Lat leg - Ankle 14        F  Wave    Nerve F Lat Ref.   ms ms  R Tibial - AH 53.8 ?56.0  L Tibial - AH 52.7 ?56.0

## 2019-07-14 ENCOUNTER — Other Ambulatory Visit: Payer: Self-pay | Admitting: Neurology

## 2019-07-14 MED ORDER — GABAPENTIN 400 MG PO CAPS: 400.0000 mg | ORAL_CAPSULE | Freq: Three times a day (TID) | ORAL | Status: DC

## 2019-07-14 MED ORDER — REPATHA SURECLICK 140 MG/ML ~~LOC~~ SOAJ: 1.0000 | SUBCUTANEOUS | 11 refills | Status: DC

## 2019-07-15 DIAGNOSIS — E782 Mixed hyperlipidemia: Secondary | ICD-10-CM | POA: Diagnosis not present

## 2019-07-15 DIAGNOSIS — F3181 Bipolar II disorder: Secondary | ICD-10-CM | POA: Diagnosis not present

## 2019-07-15 DIAGNOSIS — E1121 Type 2 diabetes mellitus with diabetic nephropathy: Secondary | ICD-10-CM | POA: Diagnosis not present

## 2019-07-15 DIAGNOSIS — Z0189 Encounter for other specified special examinations: Secondary | ICD-10-CM | POA: Diagnosis not present

## 2019-07-21 ENCOUNTER — Ambulatory Visit: Payer: Medicare Other | Admitting: Orthopaedic Surgery

## 2019-07-27 DIAGNOSIS — Z23 Encounter for immunization: Secondary | ICD-10-CM | POA: Diagnosis not present

## 2019-07-29 ENCOUNTER — Other Ambulatory Visit: Payer: Self-pay | Admitting: "Endocrinology

## 2019-07-29 DIAGNOSIS — Z0189 Encounter for other specified special examinations: Secondary | ICD-10-CM | POA: Diagnosis not present

## 2019-07-29 DIAGNOSIS — E782 Mixed hyperlipidemia: Secondary | ICD-10-CM | POA: Diagnosis not present

## 2019-07-29 DIAGNOSIS — E1121 Type 2 diabetes mellitus with diabetic nephropathy: Secondary | ICD-10-CM | POA: Diagnosis not present

## 2019-07-29 DIAGNOSIS — F3181 Bipolar II disorder: Secondary | ICD-10-CM | POA: Diagnosis not present

## 2019-08-02 DIAGNOSIS — F5101 Primary insomnia: Secondary | ICD-10-CM | POA: Diagnosis not present

## 2019-08-02 DIAGNOSIS — J410 Simple chronic bronchitis: Secondary | ICD-10-CM | POA: Diagnosis not present

## 2019-08-02 DIAGNOSIS — G2581 Restless legs syndrome: Secondary | ICD-10-CM | POA: Diagnosis not present

## 2019-08-02 DIAGNOSIS — I5022 Chronic systolic (congestive) heart failure: Secondary | ICD-10-CM | POA: Diagnosis not present

## 2019-08-02 DIAGNOSIS — Z8673 Personal history of transient ischemic attack (TIA), and cerebral infarction without residual deficits: Secondary | ICD-10-CM | POA: Diagnosis not present

## 2019-08-09 ENCOUNTER — Telehealth (INDEPENDENT_AMBULATORY_CARE_PROVIDER_SITE_OTHER): Payer: Medicare Other | Admitting: Psychiatry

## 2019-08-09 ENCOUNTER — Other Ambulatory Visit: Payer: Self-pay

## 2019-08-09 ENCOUNTER — Encounter (HOSPITAL_COMMUNITY): Payer: Self-pay | Admitting: Psychiatry

## 2019-08-09 DIAGNOSIS — Z87891 Personal history of nicotine dependence: Secondary | ICD-10-CM | POA: Diagnosis not present

## 2019-08-09 DIAGNOSIS — F331 Major depressive disorder, recurrent, moderate: Secondary | ICD-10-CM

## 2019-08-09 DIAGNOSIS — Z7982 Long term (current) use of aspirin: Secondary | ICD-10-CM | POA: Diagnosis not present

## 2019-08-09 MED ORDER — TRAZODONE HCL 100 MG PO TABS: 100.0000 mg | ORAL_TABLET | Freq: Every day | ORAL | 2 refills | Status: DC

## 2019-08-09 MED ORDER — BUPROPION HCL ER (XL) 300 MG PO TB24: 300.0000 mg | ORAL_TABLET | Freq: Every morning | ORAL | 2 refills | Status: DC

## 2019-08-09 MED ORDER — VENLAFAXINE HCL ER 150 MG PO CP24: ORAL_CAPSULE | ORAL | 2 refills | Status: DC

## 2019-08-09 MED ORDER — LAMOTRIGINE 150 MG PO TABS: 150.0000 mg | ORAL_TABLET | Freq: Every day | ORAL | 2 refills | Status: DC

## 2019-08-09 NOTE — Progress Notes (Signed)
Virtual Visit via Telephone Note  I connected with Norma [name] on 08/09/19 at  1:20 PM EDT by telephone and verified that I am speaking with the correct person using two identifiers.   I discussed the limitations, risks, security and privacy concerns of performing an evaluation and management service by telephone and the availability of in person appointments. I also discussed with the patient that there may be a patient responsible charge related to this service. The patient expressed understanding and agreed to proceed.    I discussed the assessment and treatment plan with the patient. The patient was provided an opportunity to ask questions and all were answered. The patient agreed with the plan and demonstrated an understanding of the instructions.   The patient was advised to call back or seek an in-person evaluation if the symptoms worsen or if the condition fails to improve as anticipated.  I provided 15 minutes of non-face-to-face time during this encounter.   [name], MD  [facility] MD/PA/NP OP Progress Note  08/09/2019 1:41 PM Norma [name]  MRN:  [id number]  Chief Complaint:  Chief Complaint    Depression; Anxiety; Follow-up     HPI: This patient is a 69 year old single white female who lives alone in [place]. 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 She was a patient here but was last seen in 2017. She states that she quit coming because of financial reasons.  Since then the patient has been followed by her primary doctor. She has had other health issues arise and needed CABG. She is having severe problems with knee pain and is trying to lose enough weight to have a knee replacement surgery. She also has chronic back pain. Her psychiatric medications were not really changed much since she last saw me. She states that she still goes to periods of significant depression but most of it sounds situational.  Currently her 72 year old nephew is staying at her house.  She states that he is rude and had been cursing and berating her. She finally put her foot down and got them to stop. She often has conflicts with her parents and even her sister. All of this gets her depressed. After she had been hospitalized for cardiac issues she states that her church did not do anything for her encouraged her to come back and this was very disappointing and hurtful. She has been more isolated lately.  She has been compliant with her medications which include Effexor XR Wellbutrin and Lamictal. She is trying to do a little bit of light housework. Her nephew brought dogs into the home and she enjoys the dogs. She does not currently have any thoughts of self-harm or suicide. She is not sleeping well partly due to chronic pain and partly due to having to urinate because of her blood pressure pill. She tried trazodone 50 mg but it is not helping so I suggested we increase it  The patient returns for follow-up after 2 months.  She still is not sleeping all that well.  She has been put on Requip for restless legs and gabapentin has been increased.  I also increased her temazepam but it has not helped.  Sometimes she stays up most of the night and then sleeps a lot the next day so she has her days and nights backwards.  I explained that would be better if she would sleep at night so we will switch to trazodone at bedtime.  She states overall her mood has been  stable.  She denies severe mood swings depression anxiety or thoughts of self-harm or suicidal ideation. Visit Diagnosis:    ICD-10-CM   1. Moderate episode of recurrent major depressive disorder ([place])  F33.1     Past Psychiatric History: She was hospitalized around 2001 and went through a course of ECT.  She has been tried on [employer] Wellbutrin and Zyprexa  Past Medical History:  Past Medical History:  Diagnosis Date  . Anemia   . Anxiety   . Bipolar disorder ([place])   . Bulging lumbar disc    L3-4  . Chronic  daily headache   . Chronic low back pain 09/20/2014  . COPD (chronic obstructive pulmonary disease) ([place])   . Degenerative arthritis   . Depression   . Diabetes mellitus, type II ([place])   . Diabetic neuropathy ([place]) 09/16/2018  . DM type 2 with diabetic peripheral neuropathy ([place]) 09/20/2014  . Dyslipidemia   . Dyspnea   . Gastroparesis   . GERD (gastroesophageal reflux disease)   . Heart murmur   . History of hiatal hernia   . Hypothyroidism   . IBS (irritable bowel syndrome)   . Memory difficulty 09/20/2014  . Morbid obesity ([place])   . Neuropathy   . Obstructive sleep apnea on CPAP   . Restless legs syndrome (RLS) 09/17/2012  . Stroke (cerebrum) ([place]) 05/16/2017   Left parietal  . Urticaria     Past Surgical History:  Procedure Laterality Date  . ABDOMINAL HYSTERECTOMY    . ARTHROSCOPY KNEE W/ DRILLING  06/2011   and Decemer of 2013.  [name] Carpel tunnel  1980's  . CATARACT EXTRACTION W/PHACO Right 03/21/2017   Procedure: CATARACT EXTRACTION PHACO AND INTRAOCULAR LENS PLACEMENT RIGHT EYE;  Surgeon: Baruch Goldmann, MD;  Location: [facility];  Service: Ophthalmology;  Laterality: Right;  CDE: 2.91   . CATARACT EXTRACTION W/PHACO Left 04/18/2017   Procedure: CATARACT EXTRACTION PHACO AND INTRAOCULAR LENS PLACEMENT LEFT EYE;  Surgeon: Baruch Goldmann, MD;  Location: [facility];  Service: Ophthalmology;  Laterality: Left;  left  . CHOLECYSTECTOMY    . COLONOSCOPY WITH PROPOFOL N/A 10/04/2016   Procedure: COLONOSCOPY WITH PROPOFOL;  Surgeon: Rogene Houston, MD;  Location: [facility];  Service: Endoscopy;  Laterality: N/A;  11:10  . CORONARY ARTERY BYPASS GRAFT N/A 08/29/2017   Procedure: CORONARY ARTERY BYPASS GRAFTING (CABG) x 3 WITH ENDOSCOPIC HARVESTING OF RIGHT SAPHENOUS VEIN;  Surgeon: Ivin Poot, MD;  Location: [facility];  Service: Open Heart Surgery;  Laterality: N/A;  . ESOPHAGOGASTRODUODENOSCOPY N/A 04/25/2016   Procedure: ESOPHAGOGASTRODUODENOSCOPY (EGD);  Surgeon: Rogene Houston, MD;   [facility];  Service: Endoscopy;  Laterality: N/A;  730  . LEFT HEART CATH AND CORONARY ANGIOGRAPHY N/A 08/27/2017   Procedure: LEFT HEART CATH AND CORONARY ANGIOGRAPHY;  Surgeon: Sherren Mocha, MD;  Location: [facility];  Service: Cardiovascular::  pLAD 95% - p-mLAD 50%, ostD1 90% -pD1 80%; ostOM1 90%; rPDA 80%. EF ~50-55% - HK of dital Anterolateral & Apical wall.  - Rec CVTS c/s  . RECTAL SURGERY     fissure  . SHOULDER SURGERY Left    arthroscopy in March of this year  . TEE WITHOUT CARDIOVERSION N/A 08/29/2017   Procedure: TRANSESOPHAGEAL ECHOCARDIOGRAM (TEE);  Surgeon: Prescott Gum, Collier Salina, MD;  Location: [facility];  Service: Open Heart Surgery;  Laterality: N/A;  . TRANSTHORACIC ECHOCARDIOGRAM  06/06/2017   Mild to moderate reduced EF 40 and 45%.  Anterior septal, inferoseptal and basal  to mid inferior hypokinesis.  GR 1 DD.  No significant valvular lesion    Family Psychiatric History: see below  Family History:  Family History  Problem Relation Age of Onset  . Hypertension Mother   . Lymphoma Mother   . Depression Mother   . Arthritis Father   . Alcohol abuse Father   . Hypertension Sister   . Cancer Brother        kidney and lung  . Alcohol abuse Brother   . Alcohol abuse Paternal Uncle   . Alcohol abuse Paternal Grandfather   . Alcohol abuse Paternal Grandmother   . Allergic rhinitis Neg Hx   . Angioedema Neg Hx   . Asthma Neg Hx   . Atopy Neg Hx   . Eczema Neg Hx   . Immunodeficiency Neg Hx   . Urticaria Neg Hx     Social History:  Social History   Socioeconomic History  . Marital status: Single    Spouse name: Not on file  . Number of children: 0  . Years of education: 10  . Highest education level: Not on file  Occupational History    Employer: [employer]  Tobacco Use  . Smoking status: Former Smoker    Types: Cigarettes    Quit date: 02/04/1971    Years since quitting: 48.5  . Smokeless tobacco: Never Used  . Tobacco  comment: smoked 2 cigarettes a day  Substance and Sexual Activity  . Alcohol use: No    Alcohol/week: 0.0 standard drinks  . Drug use: No  . Sexual activity: Never  Other Topics Concern  . Not on file  Social History Narrative   Patient lives at home alone.    Patient has no children.    Patient has her masters in nursing.    Patient is single.    Patient drinks about 2 glasses of tea daily.   Patient is right handed.   Social Determinants of Health   Financial Resource Strain:   . Difficulty of Paying Living Expenses:   Food Insecurity:   . Worried About Charity fundraiser in the Last Year:   . Arboriculturist in the Last Year:   Transportation Needs:   . Film/video editor (Medical):   [name] Lack of Transportation (Non-Medical):   Physical Activity:   . Days of Exercise per Week:   . Minutes of Exercise per Session:   Stress:   . Feeling of Stress :   Social Connections:   . Frequency of Communication with Friends and Family:   . Frequency of Social Gatherings with Friends and Family:   . Attends Religious Services:   . Active Member of Clubs or Organizations:   . Attends Archivist Meetings:   [name] Marital Status:     Allergies:  Allergies  Allergen Reactions  . Amoxicillin Anaphylaxis and Other (See Comments)    Has patient had a PCN reaction causing immediate rash, facial/tongue/throat swelling, SOB or lightheadedness with hypotension: Yes Has patient had a PCN reaction causing severe rash involving mucus membranes or skin necrosis: Yes Has patient had a PCN reaction that required hospitalization: Yes Has patient had a PCN reaction occurring within the last 10 years: Yes If all of the above answers are "NO", then may proceed with Cephalosporin use.   [name] Hydrocodone Anaphylaxis  . Percocet [Oxycodone-Acetaminophen] Other (See Comments)    Causes chest pain /tightness. Pressure pain around her ribs.  . Vancomycin Itching  .  Depacon [Valproic Acid] Other  (See Comments)    Causes falls     Metabolic Disorder Labs: Lab Results  Component Value Date   HGBA1C 6.8 (H) 03/05/2019   MPG 148 03/05/2019   MPG 136.98 10/30/2018   No results found for: PROLACTIN Lab Results  Component Value Date   CHOL 248 (H) 03/05/2019   TRIG 175 (H) 03/05/2019   HDL 65 03/05/2019   CHOLHDL 3.8 03/05/2019   VLDL 31 (H) 07/10/2016   LDLCALC 152 (H) 03/05/2019   LDLCALC 96 04/24/2018   Lab Results  Component Value Date   TSH 1.49 03/05/2019   TSH 2.671 10/30/2018    Therapeutic Level Labs: No results found for: LITHIUM No results found for: VALPROATE No components found for:  CBMZ  Current Medications: Current Outpatient Medications  Medication Sig Dispense Refill  . aspirin EC 81 MG tablet Take 1 tablet (81 mg total) by mouth daily. 90 tablet 3  . baclofen (LIORESAL) 10 MG tablet TAKE ONE TABLET BY MOUTH AT BEDTIME. 30 tablet 6  . buPROPion (WELLBUTRIN XL) 300 MG 24 hr tablet Take 1 tablet (300 mg total) by mouth every morning. 30 tablet 2  . calcium carbonate (TUMS - DOSED IN MG ELEMENTAL CALCIUM) 500 MG chewable tablet Chew 2 tablets by mouth as needed for indigestion or heartburn.     . carvedilol (COREG) 6.25 MG tablet TAKE ONE TABLET BY MOUTH BID(MORNING ,EVENING) 180 tablet 3  . cetirizine (ZYRTEC) 10 MG tablet Take 1 tablet tablet 1-2 times daily. (Patient taking differently: Take 10 mg by mouth daily. Take 1 tablet tablet 1-2 times daily.) 60 tablet 5  . diclofenac Sodium (VOLTAREN) 1 % GEL Apply topically 4 (four) times daily.    . diphenhydrAMINE (BENADRYL) 25 mg capsule Take 25 mg by mouth as needed.    [name] EPINEPHrine (EPIPEN 2-PAK) 0.3 mg/0.3 mL IJ SOAJ injection Inject 0.3 mg into the muscle once.    [name] esomeprazole (NEXIUM) 40 MG capsule Take 40 mg by mouth daily before supper.   12  . Evolocumab (REPATHA SURECLICK) [id number] MG/ML SOAJ Inject 1 Dose into the skin every 14 (fourteen) days. 2 pen 11  . famotidine (PEPCID) 20 MG tablet Take 1  tablet (20 mg total) by mouth daily. (Patient taking differently: Take 20 mg by mouth at bedtime. ) 30 tablet 5  . gabapentin (NEURONTIN) 400 MG capsule Take 1 capsule (400 mg total) by mouth 3 (three) times daily.    [name] glipiZIDE (GLUCOTROL XL) 5 MG 24 hr tablet TAKE ONE TABLET BY MOUTH DAILY WITH BREAKFAST 90 tablet 0  . lamoTRIgine (LAMICTAL) 150 MG tablet Take 1 tablet (150 mg total) by mouth at bedtime. 30 tablet 2  . levothyroxine (SYNTHROID, LEVOTHROID) 100 MCG tablet Take 100 mcg by mouth daily before breakfast.     . losartan (COZAAR) 25 MG tablet TAKE ONE TABLET BY MOUTH DAILY (MORNING) 90 tablet 1  . meclizine (ANTIVERT) 25 MG tablet Take 1 tablet (25 mg total) by mouth 3 (three) times daily as needed for dizziness. (Patient taking differently: Take 25 mg by mouth daily as needed for dizziness. ) 30 tablet 0  . metFORMIN (GLUCOPHAGE) 500 MG tablet TAKE ONE TABLET BY MOUTH TWICE DAILY WITH A MEAL (MORNING ,EVENING) 180 tablet 0  . ondansetron (ZOFRAN) 4 MG tablet Take 1 tablet (4 mg total) by mouth 2 (two) times daily as needed for nausea or vomiting. 30 tablet 1  . Pediatric Multiple Vitamins (FLINTSTONES MULTIVITAMIN PO)  Take by mouth.    . potassium chloride SA (KLOR-CON) 20 MEQ tablet TAKE ONE TABLET BY MOUTH EVERY DAY (MORNING-MON, WED,FRI ,AM PER PT) 90 tablet 2  . rOPINIRole (REQUIP) 3 MG tablet Take 1 tablet (3 mg total) by mouth in the morning, at noon, and at bedtime. 270 tablet 1  . torsemide (DEMADEX) 20 MG tablet TAKE ONE TABLET BY MOUTH ALTERNATING WITH TWO TABLETS daily (PER PT ONE DAILY-EXTRA IN BOTTLE) 135 tablet 3  . traMADol (ULTRAM) 50 MG tablet Take 100 mg by mouth 4 (four) times daily as needed.     . traZODone (DESYREL) 100 MG tablet Take 1 tablet (100 mg total) by mouth at bedtime. 30 tablet 2  . venlafaxine XR (EFFEXOR-XR) 150 MG 24 hr capsule TAKE TWO (2) CAPSULES BY MOUTH AT BEDTIME. 60 capsule 2   No current facility-administered medications for this visit.      Musculoskeletal: Strength & Muscle Tone: decreased Gait & Station: normal Patient leans: N/A  Psychiatric Specialty Exam: Review of Systems  Musculoskeletal: Positive for back pain.  Neurological: Positive for numbness.  All other systems reviewed and are negative.   There were no vitals taken for this visit.There is no height or weight on file to calculate BMI.  General Appearance: NA  Eye Contact:  NA  Speech:  Clear and Coherent  Volume:  Normal  Mood:  Euthymic  Affect:  NA  Thought Process:  Goal Directed  Orientation:  Full (Time, Place, and Person)  Thought Content: WDL   Suicidal Thoughts:  No  Homicidal Thoughts:  No  Memory:  Immediate;   Good Recent;   Good Remote;   Good  Judgement:  Good  Insight:  Fair  Psychomotor Activity:  Decreased  Concentration:  Concentration: Good and Attention Span: Good  Recall:  Good  Fund of Knowledge: Good  Language: Good  Akathisia:  No  Handed:  Right  AIMS (if indicated): not done  Assets:  Communication Skills Desire for Improvement Resilience Social Support Talents/Skills  ADL's:  Intact  Cognition: WNL  Sleep:  Good   Screenings: Mini-Mental     Office Visit from 02/18/2018 in [facility] Office Visit from 09/17/2017 in [facility]  Total Score (max 30 points )  28  28    PHQ2-9     Counselor from 05/17/2019 in [facility] Office Visit from 09/11/2017 in [facility] Office Visit from 04/22/2017 in [facility] Office Visit from 12/06/2016 in [facility] Office Visit from 07/16/2016 in [facility]  PHQ-2 Total Score  1  0  0  0  0       Assessment and Plan: This patient is a 69 year old female with a history of depression anxiety mood swings and difficulty sleeping.  Since her sleep is still not improved on increased temazepam we will  discontinue it and change to trazodone 100 mg at bedtime.  She will continue Effexor XR 300 mg at bedtime as well as Wellbutrin XL 300 mg daily for depression.  She will continue Lamictal 150 mg daily for mood swings.  She will return to see me in 3 months   [name], MD 08/09/2019, 1:41 PM

## 2019-08-10 ENCOUNTER — Other Ambulatory Visit (HOSPITAL_COMMUNITY): Payer: Self-pay | Admitting: Psychiatry

## 2019-08-11 ENCOUNTER — Ambulatory Visit: Payer: Medicare Other | Admitting: "Endocrinology

## 2019-08-11 ENCOUNTER — Ambulatory Visit: Payer: Medicare Other | Admitting: Family Medicine

## 2019-08-12 ENCOUNTER — Encounter: Payer: Self-pay | Admitting: Neurology

## 2019-08-12 ENCOUNTER — Other Ambulatory Visit: Payer: Self-pay | Admitting: Neurology

## 2019-08-16 ENCOUNTER — Encounter: Payer: Self-pay | Admitting: Neurology

## 2019-08-16 ENCOUNTER — Other Ambulatory Visit: Payer: Self-pay

## 2019-08-16 ENCOUNTER — Ambulatory Visit (INDEPENDENT_AMBULATORY_CARE_PROVIDER_SITE_OTHER): Payer: Medicare Other | Admitting: Neurology

## 2019-08-16 VITALS — BP 144/74 | HR 91 | Temp 95.8°F | Ht 61.0 in | Wt 243.0 lb

## 2019-08-16 DIAGNOSIS — R05 Cough: Secondary | ICD-10-CM | POA: Insufficient documentation

## 2019-08-16 DIAGNOSIS — G4733 Obstructive sleep apnea (adult) (pediatric): Secondary | ICD-10-CM | POA: Diagnosis not present

## 2019-08-16 DIAGNOSIS — G4711 Idiopathic hypersomnia with long sleep time: Secondary | ICD-10-CM

## 2019-08-16 DIAGNOSIS — I2510 Atherosclerotic heart disease of native coronary artery without angina pectoris: Secondary | ICD-10-CM

## 2019-08-16 DIAGNOSIS — R682 Dry mouth, unspecified: Secondary | ICD-10-CM | POA: Insufficient documentation

## 2019-08-16 DIAGNOSIS — F5105 Insomnia due to other mental disorder: Secondary | ICD-10-CM

## 2019-08-16 DIAGNOSIS — F99 Mental disorder, not otherwise specified: Secondary | ICD-10-CM | POA: Diagnosis not present

## 2019-08-16 DIAGNOSIS — R053 Chronic cough: Secondary | ICD-10-CM

## 2019-08-16 NOTE — Progress Notes (Signed)
SLEEP MEDICINE CLINIC    Provider:  Larey Seat, MD  Primary Care Physician:  Celene Squibb, MD [place] [address]     Referring Provider: Celene Squibb, Md [address] [place],  [place] [address]          Chief Complaint according to patient   Patient presents with:    . New Patient (Initial Visit)     I know I used to snore- and have OSA- pt rm 10 alone, pt had a PSG > 12 yrs ago. she is no longer using CPAP, having  d/c over 1 year ago-- because supplier having difficulty getting necc. supplies.       HISTORY OF PRESENT ILLNESS:  Norma [name] is a 69 year- old Caucasian female patient and is seen here as a referral on 08/16/2019 from Dr. [name].  Chief concern according to patient : Norma [name], who prefers to go by the name Norma [name], reports that she had used her CPAP compliantly for several years when her dog treat heart of the mask and headgear.  She called the DME and was promised to get supplies but apparently waited for several months for supplies that never Delivered.  She then learned that she was deemed noncompliant because she had not used her CPAP.  She is now here to reestablish herself.  This visit is dedicated to sleep medicine only.   I have the pleasure of seeing Norma [name] on [phone], a right handed White or Caucasian female with a possible sleep disorder.  She has been treated with ECT fro Major and manic depression,    has a past medical history of Anemia, Anxiety, Bipolar disorder ([place]), Bulging lumbar disc, Chronic daily headache, Chronic low back pain (09/20/2014), COPD (chronic obstructive pulmonary disease) ([place]), Degenerative arthritis, Depression, Diabetes mellitus, type II ([place]), Diabetic neuropathy ([place]) (09/16/2018), DM type 2 with diabetic peripheral neuropathy ([place]) (09/20/2014), Dyslipidemia, Dyspnea, Gastroparesis, GERD (gastroesophageal reflux disease), Heart murmur, History of hiatal hernia, Hypothyroidism, IBS (irritable  bowel syndrome), Memory difficulty (09/20/2014), Morbid obesity ([place]), Neuropathy, Obstructive sleep apnea on CPAP, Restless legs syndrome (RLS) (09/17/2012), Stroke (cerebrum) ([place]) (05/16/2017), and Urticaria..   The patient had the first sleep study possibly in the year 2000-2003  with a result of OSA being diagnosed, and started CPAP- her last machine was the third CPAP she owned.   Sleep relevant medical history:  RLS, Insomnia, OSA not treated currently. Trazodone  started by Dr.Ross, psychiatry.     Family medical /sleep history: NO other family member with OSA,  Father (89) with insomnia,.    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  Family status is single , no children.  Pets are present. Cats and one dog.  Tobacco use- remote in the 1970s- for a year or so. [name]  ETOH use : none ,  Caffeine intake in form of Coffee( none) Soda(none ) Tea ( iced- 2 glasses) no energy drinks. No Regular exercise - " too much pain in knees and back" .    Sleep habits are as follows: The patient's TV dinner time is between 6-11 PM. Reports RLS.  The patient goes to bed at variable - cyclic insomnia, days with insomnia and alternating with hypersomnia.     The preferred sleep position is on her sides, with the support of 2 pillows. Dreams are  reportedly rare-.  No established usual rise time. If her dog lets her sleep, she will sleep until noon. The patient wakes up spontaneously.  She reports not feeling refreshed or restored in AM, with symptoms such as dry mouth  and residual fatigue.  Naps are taken frequently, afternoons lasting from 1-2 hours and are more/ refreshing than nocturnal sleep.    Review of Systems: Out of a complete 14 system review, the patient complains of only the following symptoms, and all other reviewed systems are negative.:  Fatigue, sleepiness , RLS< snoring, nocturia and otherwise fragmented sleep, cyclic and  chronic Insomnia - addressed by psychiatric care    How likely are you to doze in the following situations: 0 = not likely, 1 = slight chance, 2 = moderate chance, 3 = high chance   Sitting and Reading? Watching Television? Sitting inactive in a public place (theater or meeting)? As a passenger in a car for an hour without a break? Lying down in the afternoon when circumstances permit? Sitting and talking to someone? Sitting quietly after lunch without alcohol? In a car, while stopped for a few minutes in traffic?   Total = 18/ 24 points   FSS endorsed at 57/ 63 points.  GDS endorsed at 8 out of 15, indicative of depression being present now.   Social History   Socioeconomic History  . Marital status: Single    Spouse name: Not on file  . Number of children: 0  . Years of education: 70  . Highest education level: Not on file  Occupational History    Employer: [employer]  Tobacco Use  . Smoking status: Former Smoker    Types: Cigarettes    Quit date: 02/04/1971    Years since quitting: 48.5  . Smokeless tobacco: Never Used  . Tobacco comment: smoked 2 cigarettes a day  Substance and Sexual Activity  . Alcohol use: No    Alcohol/week: 0.0 standard drinks  . Drug use: No  . Sexual activity: Never  Other Topics Concern  . Not on file  Social History Narrative   Patient lives at home alone.    Patient has no children.    Patient has her masters in nursing.    Patient is single.    Patient drinks about 2 glasses of tea daily.   Patient is right handed.   Social Determinants of Health   Financial Resource Strain:   . Difficulty of Paying Living Expenses:   Food Insecurity:   . Worried About Charity fundraiser in the Last Year:   . Arboriculturist in the Last Year:   Transportation Needs:   . Film/video editor (Medical):   [name] Lack of Transportation (Non-Medical):   Physical Activity:   . Days of Exercise per Week:   . Minutes of Exercise per  Session:   Stress:   . Feeling of Stress :   Social Connections:   . Frequency of Communication with Friends and Family:   . Frequency of Social Gatherings with Friends and Family:   . Attends Religious Services:   . Active Member of Clubs or Organizations:   . Attends Archivist Meetings:   [name] Marital Status:     Family History  Problem Relation Age of Onset  . Hypertension Mother   . Lymphoma Mother   . Depression Mother   . Arthritis Father   . Alcohol abuse Father   . Hypertension Sister   . Cancer  Brother        kidney and lung  . Alcohol abuse Brother   . Alcohol abuse Paternal Uncle   . Alcohol abuse Paternal Grandfather   . Alcohol abuse Paternal Grandmother   . Allergic rhinitis Neg Hx   . Angioedema Neg Hx   . Asthma Neg Hx   . Atopy Neg Hx   . Eczema Neg Hx   . Immunodeficiency Neg Hx   . Urticaria Neg Hx     Past Medical History:  Diagnosis Date  . Anemia   . Anxiety   . Bipolar disorder ([facility])   . Bulging lumbar disc    L3-4  .    [name] Chronic low back pain 09/20/2014  . COPD (chronic obstructive pulmonary disease) ([place])   . Degenerative arthritis       .    .  09/16/2018  . DM type 2 with diabetic peripheral neuropathy  2003 ([facility]) 09/20/2014  . Dyslipidemia   . Dyspnea on exertion.    . Gastroparesis in diabetes.    [name] GERD (gastroesophageal reflux disease)   . Heart murmur   . History of hiatal hernia   . Hypothyroidism   . IBS (irritable bowel syndrome)   . Memory difficulty 09/20/2014  . Morbid obesity ([facility]) BMI 45    .    [name] Obstructive sleep apnea on CPAP   . Restless legs syndrome (RLS) 09/17/2012  . Stroke (cerebrum) ([facility]) 05/16/2017   Left parietal  . Urticaria     Past Surgical History:  Procedure Laterality Date  . ABDOMINAL HYSTERECTOMY    . ARTHROSCOPY KNEE W/ DRILLING  06/2011   and Decemer of 2013.  [name] Carpel tunnel  1980's  . CATARACT EXTRACTION W/PHACO Right 03/21/2017   Procedure: CATARACT EXTRACTION PHACO AND INTRAOCULAR  LENS PLACEMENT RIGHT EYE;  Surgeon: Baruch Goldmann, MD;  Location: [facility];  Service: Ophthalmology;  Laterality: Right;  CDE: 2.91   . CATARACT EXTRACTION W/PHACO Left 04/18/2017   Procedure: CATARACT EXTRACTION PHACO AND INTRAOCULAR LENS PLACEMENT LEFT EYE;  Surgeon: Baruch Goldmann, MD;  Location: [facility];  Service: Ophthalmology;  Laterality: Left;  left  . CHOLECYSTECTOMY    . COLONOSCOPY WITH PROPOFOL N/A 10/04/2016   Procedure: COLONOSCOPY WITH PROPOFOL;  Surgeon: Rogene Houston, MD;  [facility];  Service: Endoscopy;  Laterality: N/A;  11:10  . CORONARY ARTERY BYPASS GRAFT N/A 08/29/2017   Procedure: CORONARY ARTERY BYPASS GRAFTING (CABG) x 3 WITH ENDOSCOPIC HARVESTING OF RIGHT SAPHENOUS VEIN;  Surgeon: Ivin Poot, MD;  Location: [facility];  Service: Open Heart Surgery;  Laterality: N/A;  . ESOPHAGOGASTRODUODENOSCOPY N/A 04/25/2016   Procedure: ESOPHAGOGASTRODUODENOSCOPY (EGD);  Surgeon: Rogene Houston, MD;  [facility];  Service: Endoscopy;  Laterality: N/A;  730  . LEFT HEART CATH AND CORONARY ANGIOGRAPHY N/A 08/27/2017   Procedure: LEFT HEART CATH AND CORONARY ANGIOGRAPHY;  Surgeon: Sherren Mocha, MD;  Location: [facility];  Service: Cardiovascular::  pLAD 95% - p-mLAD 50%, ostD1 90% -pD1 80%; ostOM1 90%; rPDA 80%. EF ~50-55% - HK of dital Anterolateral & Apical wall.  - Rec CVTS c/s  . RECTAL SURGERY     fissure  . SHOULDER SURGERY Left    arthroscopy in March of this year  . TEE WITHOUT CARDIOVERSION N/A 08/29/2017   Procedure: TRANSESOPHAGEAL ECHOCARDIOGRAM (TEE);  Surgeon: Prescott Gum, Collier Salina, MD;  Location: [facility];  Service: Open Heart Surgery;  Laterality: N/A;  . TRANSTHORACIC ECHOCARDIOGRAM  06/06/2017  Mild to moderate reduced EF 40 and 45%.  Anterior septal, inferoseptal and basal to mid inferior hypokinesis.  GR 1 DD.  No significant valvular lesion     Current Outpatient Medications on File Prior to Visit  Medication Sig Dispense Refill  .  ALPRAZolam (XANAX) 0.25 MG tablet Take 0.25 mg by mouth 2 (two) times daily as needed for anxiety.    [name] aspirin EC 81 MG tablet Take 1 tablet (81 mg total) by mouth daily. 90 tablet 3  . baclofen (LIORESAL) 10 MG tablet TAKE ONE TABLET BY MOUTH AT BEDTIME. 30 tablet 6  . buPROPion (WELLBUTRIN XL) 300 MG 24 hr tablet Take 1 tablet (300 mg total) by mouth every morning. 30 tablet 2  . calcium carbonate (TUMS - DOSED IN MG ELEMENTAL CALCIUM) 500 MG chewable tablet Chew 2 tablets by mouth as needed for indigestion or heartburn.     . carvedilol (COREG) 6.25 MG tablet TAKE ONE TABLET BY MOUTH BID(MORNING ,EVENING) 180 tablet 3  . cetirizine (ZYRTEC) 10 MG tablet Take 1 tablet tablet 1-2 times daily. (Patient taking differently: Take 10 mg by mouth daily. Take 1 tablet tablet 1-2 times daily.) 60 tablet 5  . diclofenac Sodium (VOLTAREN) 1 % GEL Apply topically 4 (four) times daily.    . diphenhydrAMINE (BENADRYL) 25 mg capsule Take 25 mg by mouth as needed.    [name] EPINEPHrine (EPIPEN 2-PAK) 0.3 mg/0.3 mL IJ SOAJ injection Inject 0.3 mg into the muscle once.    [name] esomeprazole (NEXIUM) 40 MG capsule Take 40 mg by mouth daily before supper.   12  . Evolocumab (REPATHA SURECLICK) [id number] MG/ML SOAJ Inject 1 Dose into the skin every 14 (fourteen) days. 2 pen 11  . famotidine (PEPCID) 20 MG tablet Take 1 tablet (20 mg total) by mouth daily. (Patient taking differently: Take 20 mg by mouth at bedtime. ) 30 tablet 5  . gabapentin (NEURONTIN) 400 MG capsule Take 1 capsule (400 mg total) by mouth 3 (three) times daily.    [name] glipiZIDE (GLUCOTROL XL) 5 MG 24 hr tablet TAKE ONE TABLET BY MOUTH DAILY WITH BREAKFAST 90 tablet 0  . lamoTRIgine (LAMICTAL) 150 MG tablet Take 1 tablet (150 mg total) by mouth at bedtime. 30 tablet 2  . levothyroxine (SYNTHROID, LEVOTHROID) 100 MCG tablet Take 100 mcg by mouth daily before breakfast.     . losartan (COZAAR) 25 MG tablet TAKE ONE TABLET BY MOUTH DAILY (MORNING) 90 tablet 1  .  meclizine (ANTIVERT) 25 MG tablet Take 1 tablet (25 mg total) by mouth 3 (three) times daily as needed for dizziness. (Patient taking differently: Take 25 mg by mouth daily as needed for dizziness. ) 30 tablet 0  . metFORMIN (GLUCOPHAGE) 500 MG tablet TAKE ONE TABLET BY MOUTH TWICE DAILY WITH A MEAL (MORNING ,EVENING) 180 tablet 0  . montelukast (SINGULAIR) 10 MG tablet     . ondansetron (ZOFRAN) 4 MG tablet Take 1 tablet (4 mg total) by mouth 2 (two) times daily as needed for nausea or vomiting. 30 tablet 1  . Pediatric Multiple Vitamins (FLINTSTONES MULTIVITAMIN PO) Take by mouth.    [name] POTASSIUM CHLORIDE PO     . potassium chloride SA (KLOR-CON) 20 MEQ tablet TAKE ONE TABLET BY MOUTH EVERY DAY (MORNING-MON, WED,FRI ,AM PER PT) 90 tablet 2  . rOPINIRole (REQUIP) 3 MG tablet Take 1 tablet (3 mg total) by mouth in the morning, at noon, and at bedtime. 270 tablet 1  . temazepam (RESTORIL)  30 MG capsule Take 30 mg by mouth at bedtime.    . torsemide (DEMADEX) 20 MG tablet TAKE ONE TABLET BY MOUTH ALTERNATING WITH TWO TABLETS daily (PER PT ONE DAILY-EXTRA IN BOTTLE) 135 tablet 3  . traMADol (ULTRAM) 50 MG tablet Take 100 mg by mouth 4 (four) times daily as needed.     . venlafaxine XR (EFFEXOR-XR) 150 MG 24 hr capsule TAKE TWO (2) CAPSULES BY MOUTH AT BEDTIME. 60 capsule 2  . traZODone (DESYREL) 100 MG tablet Take 1 tablet (100 mg total) by mouth at bedtime. (Patient not taking: Reported on 08/16/2019) 30 tablet 2   No current facility-administered medications on file prior to visit.    Allergies  Allergen Reactions  . Amoxicillin Anaphylaxis and Other (See Comments)    Has patient had a PCN reaction causing immediate rash, facial/tongue/throat swelling, SOB or lightheadedness with hypotension: Yes Has patient had a PCN reaction causing severe rash involving mucus membranes or skin necrosis: Yes Has patient had a PCN reaction that required hospitalization: Yes Has patient had a PCN reaction  occurring within the last 10 years: Yes If all of the above answers are "NO", then may proceed with Cephalosporin use.   [name] Hydrocodone Anaphylaxis  . Percocet [Oxycodone-Acetaminophen] Other (See Comments)    Causes chest pain /tightness. Pressure pain around her ribs.  . Vancomycin Itching  . Depacon [Valproic Acid] Other (See Comments)    Causes falls   . Iron Nausea And Vomiting    Physical exam:  Today's Vitals   08/16/19 1103  BP: (!) 144/74  Pulse: 91  Temp: (!) 95.8 F (35.4 C)  TempSrc: Temporal  SpO2: 98%  Weight: 243 lb (110.2 kg)  Height: 5\' 1"  (1.549 m)   Body mass index is 45.91 kg/m.   Wt Readings from Last 3 Encounters:  08/16/19 243 lb (110.2 kg)  07/08/19 241 lb 9.6 oz (109.6 kg)  06/16/19 240 lb (108.9 kg)     Ht Readings from Last 3 Encounters:  08/16/19 5\' 1"  (1.549 m)  07/08/19 5\' 1"  (1.549 m)  06/16/19 5\' 1"  (1.549 m)      General: The patient is awake, alert and appears not in acute distress. The patient is well groomed. She is restless, akathesia? Orofacial dyskinesia.  Head: Normocephalic, atraumatic. Neck is supple. Mallampati 3 plus, reddened soft tissue.,  neck circumference:15.5  inches . Nasal airflow patent, but congested at night.   Retrognathia is  seen.  Dental status: poor.  Cardiovascular:  Regular rate and cardiac rhythm by pulse,  without distended neck veins. Respiratory: Lungs are clear to auscultation.  Skin:  With evidence of ankle edema, and antibrachial scarring- cutting ? Rash?. Trunk: The patient's posture is slouched.    Neurologic exam : The patient is awake and alert, oriented to place and time.   Memory subjective described as impaired since ECT- expected side effect.   Attention span & concentration ability appears normal.  Speech is slowed and slurred. No aphasia.  Mood and affect are appropriate.   Cranial nerves: no loss of smell or taste reported  Pupils are equal and briskly reactive to light.  Funduscopic exam deferred. .  Extraocular movements in vertical and horizontal planes were intact and without nystagmus. No Diplopia. Visual fields by finger perimetry are intact. Hearing was intact to soft voice and finger rubbing. Facial sensation intact to fine touch. Facial motor strength is symmetric and tongue and uvula move midline.  Neck ROM : rotation, tilt and flexion  extension were normal for age and shoulder shrug was symmetrical.    Motor exam:  Symmetric bulk, tone and ROM.   Normal tone without cog wheeling, symmetric grip strength . Hip flexion weakness and adductor weakness noted.    Sensory:  Fine touch and vibration were  normal.  Proprioception tested in the upper extremities was normal.   Coordination: Rapid alternating movements in the fingers/hands were of normal speed.  The Finger-to-nose maneuver was intact without evidence of ataxia, dysmetria or tremor.   Gait and station: Patient could rise by bracing herself  from a seated position, walked without assistive device.  She refuses to use walker or cane, she reported.  Stance is of widerbase and the patient turned with 5 steps.  Toe and heel walk were deferred.  Deep tendon reflexes: in the upper and lower extremities are symmetric and intact.  Babinski response was deferred.        After spending a total time of  50  minutes face to face and additional time for physical and neurologic examination, review of laboratory studies,  personal review of imaging studies, reports and results of other testing and review of referral information / records as far as provided in visit, I have established the following assessments:  The patient has been followed by Dr. Lanetta Inch for decades who also prescribed her CPAP machines, she reported.  With his retirement she is needing to transfer CPAP care.  Dr. Salomon Mast, her new primary care physician, saw the patient on 02 August 2019 and arranged for this transfer.  She reportedly has  been coughing for more than a year but chest x-rays were supposedly all normal.  She has more congestion now at night nasal obstruction she has a history of frequent sinus disease, she takes Zyrtec at times and this has helped.  She has requested another sleep study she is supposed to be wearing a CPAP but could not get supplies for reasons stated above.  2 warts Dr. [name] the report was that she had not been using the CPAP because she felt the pressure was too high.  Now she falls very easily asleep when not physically challenged or not mentally stimulated and she is fatigued during the day.  Should her history of bipolar disorder complicates the issue as" often causes of chronic and cyclic insomnia alternating with hypersomnia.  Her risk factors are unchanged also she has never been a smoker she demonstrates a BMI of 50 of 46, and reported to have restless legs.  She has been followed by neurologist Dr. Jannifer Franklin according to Dr. Juel Burrow notes and takes Requip 3 times a day.  Her psychiatrist has prescribed temazepam, Effexor, Wellbutrin.  She carries a diagnosis of cyclic insomnia secondary to mental illness, restless leg syndrome with possible akathisia component, chronic systolic congestive heart failure, chronic pancreatitis, daytime hypersomnia.  CPAP was just ordered about 3 years ago this does not consistent with the patient's report I think should she has not had a new CPAP for over 5 years.   My Plan is to proceed with:  1) I suggest a SPLIT night PSG- the patient is fully vaccinated - had her Wynetta Emery and Wynetta Emery Vaccine just 2 weeks ago.  PSG will be ordered.     I would like to thank Dr [name] and Dr. Jannifer Franklin for allowing me to meet with and to take care of this pleasant patient.   In short, TILLA BRIDSON is presenting with insomnia due to mental illness, alternating  cyclic insomnia and hypersomnia, lack of sleep routines or sleep hygiene. RLS may well be a factor, she is DM 2 , and has well  documented DM neuropathy and sciatica - followed by dr. Jannifer Franklin.  All Notes  Progress Notes by Kathrynn Ducking, MD at 07/13/2019 12:30 PM Author: Kathrynn Ducking, MD Author Type: Physician Filed: 07/13/2019 1:51 PM  Note Status: Signed Cosign: Cosign Not Required Encounter Date: 07/13/2019  Editor: Kathrynn Ducking, MD (Physician)    Please refer to EMG and nerve conduction procedure note.       RV through our NP within 2 month.     Electronically signed by: Larey Seat, MD 08/16/2019 11:24 AM  [facility] and [place] certified by The [employer] of Sleep Medicine and Diplomate of the [employer] of Sleep Medicine. Board certified In Neurology through the [place], Fellow of the [employer] of Neurology. Medical Director of [employer].

## 2019-08-16 NOTE — Patient Instructions (Signed)
Please remember to try to maintain good sleep hygiene, which means: Keep a regular sleep and wake schedule, try not to exercise or have a meal within 2 hours of your bedtime, try to keep your bedroom conducive for sleep, that is, cool and dark, without light distractors such as an illuminated alarm clock, and refrain from watching TV right before sleep or in the middle of the night and do not keep the TV or radio on during the night. Also, try not to use or play on electronic devices at bedtime, such as your cell phone, tablet PC or laptop. If you like to read at bedtime on an electronic device, try to dim the background light as much as possible. Do not eat in the middle of the night.   We will request a sleep study.    We will look for leg twitching and snoring or sleep apnea.   For chronic insomnia, you are best followed by a psychiatrist and/or sleep psychologist.   We will call you with the sleep study results and make a follow up appointment if needed.     Insomnia Insomnia is a sleep disorder that makes it difficult to fall asleep or stay asleep. Insomnia can cause fatigue, low energy, difficulty concentrating, mood swings, and poor performance at work or school. There are three different ways to classify insomnia:  Difficulty falling asleep.  Difficulty staying asleep.  Waking up too early in the morning. Any type of insomnia can be long-term (chronic) or short-term (acute). Both are common. Short-term insomnia usually lasts for three months or less. Chronic insomnia occurs at least three times a week for longer than three months. What are the causes? Insomnia may be caused by another condition, situation, or substance, such as:  Anxiety.  Certain medicines.  Gastroesophageal reflux disease (GERD) or other gastrointestinal conditions.  Asthma or other breathing conditions.  Restless legs syndrome, sleep apnea, or other sleep disorders.  Chronic  pain.  Menopause.  Stroke.  Abuse of alcohol, tobacco, or illegal drugs.  Mental health conditions, such as depression.  Caffeine.  Neurological disorders, such as Alzheimer's disease.  An overactive thyroid (hyperthyroidism). Sometimes, the cause of insomnia may not be known. What increases the risk? Risk factors for insomnia include:  Gender. Women are affected more often than men.  Age. Insomnia is more common as you get older.  Stress.  Lack of exercise.  Irregular work schedule or working night shifts.  Traveling between different time zones.  Certain medical and mental health conditions. What are the signs or symptoms? If you have insomnia, the main symptom is having trouble falling asleep or having trouble staying asleep. This may lead to other symptoms, such as:  Feeling fatigued or having low energy.  Feeling nervous about going to sleep.  Not feeling rested in the morning.  Having trouble concentrating.  Feeling irritable, anxious, or depressed. How is this diagnosed? This condition may be diagnosed based on:  Your symptoms and medical history. Your health care provider may ask about: ? Your sleep habits. ? Any medical conditions you have. ? Your mental health.  A physical exam. How is this treated? Treatment for insomnia depends on the cause. Treatment may focus on treating an underlying condition that is causing insomnia. Treatment may also include:  Medicines to help you sleep.  Counseling or therapy.  Lifestyle adjustments to help you sleep better. Follow these instructions at home: Eating and drinking   Limit or avoid alcohol, caffeinated beverages, and cigarettes,   especially close to bedtime. These can disrupt your sleep.  Do not eat a large meal or eat spicy foods right before bedtime. This can lead to digestive discomfort that can make it hard for you to sleep. Sleep habits   Keep a sleep diary to help you and your health care  provider figure out what could be causing your insomnia. Write down: ? When you sleep. ? When you wake up during the night. ? How well you sleep. ? How rested you feel the next day. ? Any side effects of medicines you are taking. ? What you eat and drink.  Make your bedroom a dark, comfortable place where it is easy to fall asleep. ? Put up shades or blackout curtains to block light from outside. ? Use a white noise machine to block noise. ? Keep the temperature cool.  Limit screen use before bedtime. This includes: ? Watching TV. ? Using your smartphone, tablet, or computer.  Stick to a routine that includes going to bed and waking up at the same times every day and night. This can help you fall asleep faster. Consider making a quiet activity, such as reading, part of your nighttime routine.  Try to avoid taking naps during the day so that you sleep better at night.  Get out of bed if you are still awake after 15 minutes of trying to sleep. Keep the lights down, but try reading or doing a quiet activity. When you feel sleepy, go back to bed. General instructions  Take over-the-counter and prescription medicines only as told by your health care provider.  Exercise regularly, as told by your health care provider. Avoid exercise starting several hours before bedtime.  Use relaxation techniques to manage stress. Ask your health care provider to suggest some techniques that may work well for you. These may include: ? Breathing exercises. ? Routines to release muscle tension. ? Visualizing peaceful scenes.  Make sure that you drive carefully. Avoid driving if you feel very sleepy.  Keep all follow-up visits as told by your health care provider. This is important. Contact a health care provider if:  You are tired throughout the day.  You have trouble in your daily routine due to sleepiness.  You continue to have sleep problems, or your sleep problems get worse. Get help right  away if:  You have serious thoughts about hurting yourself or someone else. If you ever feel like you may hurt yourself or others, or have thoughts about taking your own life, get help right away. You can go to your nearest emergency department or call:  Your local emergency services (911 in the [place]  A suicide crisis helpline, such as the National Suicide Prevention Lifeline at 1-800-273-8255. This is open 24 hours a day. Summary  Insomnia is a sleep disorder that makes it difficult to fall asleep or stay asleep.  Insomnia can be long-term (chronic) or short-term (acute).  Treatment for insomnia depends on the cause. Treatment may focus on treating an underlying condition that is causing insomnia.  Keep a sleep diary to help you and your health care provider figure out what could be causing your insomnia. This information is not intended to replace advice given to you by your health care provider. Make sure you discuss any questions you have with your health care provider. Document Revised: 03/14/2017 Document Reviewed: 01/09/2017 Elsevier Patient Education  2020 Elsevier Inc.  

## 2019-08-27 ENCOUNTER — Ambulatory Visit: Payer: Medicare Other | Admitting: "Endocrinology

## 2019-09-07 ENCOUNTER — Other Ambulatory Visit: Payer: Self-pay | Admitting: Cardiology

## 2019-09-08 ENCOUNTER — Encounter: Payer: Self-pay | Admitting: Neurology

## 2019-09-09 ENCOUNTER — Telehealth: Payer: Self-pay | Admitting: Internal Medicine

## 2019-09-09 NOTE — Telephone Encounter (Signed)
New Message  Talked with the patient and she has questions regarding getting help for repatha. Please call back

## 2019-09-09 NOTE — Telephone Encounter (Signed)
LMTCB

## 2019-09-11 ENCOUNTER — Encounter: Payer: Self-pay | Admitting: Neurology

## 2019-09-14 ENCOUNTER — Telehealth: Payer: Self-pay

## 2019-09-14 ENCOUNTER — Ambulatory Visit (INDEPENDENT_AMBULATORY_CARE_PROVIDER_SITE_OTHER): Payer: Medicare Other | Admitting: Neurology

## 2019-09-14 DIAGNOSIS — F5105 Insomnia due to other mental disorder: Secondary | ICD-10-CM

## 2019-09-14 DIAGNOSIS — R682 Dry mouth, unspecified: Secondary | ICD-10-CM

## 2019-09-14 DIAGNOSIS — E662 Morbid (severe) obesity with alveolar hypoventilation: Secondary | ICD-10-CM

## 2019-09-14 DIAGNOSIS — R053 Chronic cough: Secondary | ICD-10-CM

## 2019-09-14 DIAGNOSIS — G4733 Obstructive sleep apnea (adult) (pediatric): Secondary | ICD-10-CM

## 2019-09-14 DIAGNOSIS — R0902 Hypoxemia: Secondary | ICD-10-CM

## 2019-09-14 DIAGNOSIS — G4734 Idiopathic sleep related nonobstructive alveolar hypoventilation: Secondary | ICD-10-CM

## 2019-09-14 DIAGNOSIS — G4711 Idiopathic hypersomnia with long sleep time: Secondary | ICD-10-CM

## 2019-09-14 NOTE — Telephone Encounter (Signed)
If patient calls back per Dr. Jannifer Franklin he wants her to follow up with Judson Roch NP. Let her know she is Dr Jannifer Franklin NP and his note states to see. She works closely with Dr. Jannifer Franklin.  LEft vm for patient to reschedule with Judson Roch NP.

## 2019-09-14 NOTE — Telephone Encounter (Signed)
Pt left a VM with our office to discuss her appt that was supposed to be with a NP for Dr. Jannifer Franklin. She can be reached at [phone].

## 2019-09-17 ENCOUNTER — Telehealth: Payer: Self-pay | Admitting: Internal Medicine

## 2019-09-17 NOTE — Telephone Encounter (Signed)
Looks like one of yours

## 2019-09-17 NOTE — Telephone Encounter (Signed)
New Message  Pt called an wanted to speak to a nurse or doctor regarding help with her repatha. Please call back to discuss

## 2019-09-17 NOTE — Telephone Encounter (Signed)
Spoke with patient about Repatha assistance and advised that she apply for a [employer] grant  Scheduled lipid f/u on 11/04/2019 @ 0800

## 2019-09-17 NOTE — Telephone Encounter (Signed)
Patient has questions regarding her Repatha.   Thank you!

## 2019-09-22 ENCOUNTER — Ambulatory Visit: Payer: Medicare Other | Admitting: Allergy & Immunology

## 2019-09-23 DIAGNOSIS — E662 Morbid (severe) obesity with alveolar hypoventilation: Secondary | ICD-10-CM | POA: Insufficient documentation

## 2019-09-23 DIAGNOSIS — R0902 Hypoxemia: Secondary | ICD-10-CM | POA: Insufficient documentation

## 2019-09-23 DIAGNOSIS — G4734 Idiopathic sleep related nonobstructive alveolar hypoventilation: Secondary | ICD-10-CM | POA: Insufficient documentation

## 2019-09-23 NOTE — Addendum Note (Signed)
Addended by: Larey Seat on: 09/23/2019 08:28 PM   Modules accepted: Orders

## 2019-09-23 NOTE — Procedures (Signed)
PATIENT'S NAME:  Norma [name], Norma [name] DOB:      [birth year]      MR#:    [id number]     DATE OF RECORDING: 09/14/2019 A: REFERRING M.D.:  Allyn Kenner, MD Study Performed:   Baseline Polysomnogram HISTORY:  69 year- old Caucasian female patient and is seen here as a referral on 08/16/2019 from Dr. [name].  Chief concern according to patient : [name], who prefers to go by the name' Debbie', reports that she had used her CPAP compliantly for several years when her dog attacked the mask and headgear.  She called the DME and was promised to get supplies but apparently waited passively for several months for supplies that were never delivered.  She then learned that she was deemed noncompliant because she had not used her CPAP. This visit is dedicated to sleep medicine only.   [name] RN was seen on [phone], a right handed Caucasian female with OSA history. She has been treated with ECT for Bipolar depression, medical history of Anemia, Bipolar disorder ([place]), COPD (chronic obstructive pulmonary disease) ([place]), Degenerative arthritis, Depression, Diabetes mellitus, type II neuropathy ([place]) (09/16/2018), (09/20/2014), Dyslipidemia, Dyspnea, Gastroparesis, GERD (gastroesophageal reflux disease), Heart murmur, History of hiatal hernia, Hypothyroidism, IBS (irritable bowel syndrome), Morbid obesity ([place]), Obstructive sleep apnea, untreated, Restless legs syndrome (RLS) (09/17/2012), Stroke (cerebrum) ([place]) (05/16/2017), and EF is 40%.    The patient had the first sleep study possibly in the year 2000-2003 with a result of OSA diagnosis and started CPAP- her last machine was the third CPAP she owned.  Trazodone was started by Dr. Harrington Challenger, psychiatry.     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   Reports RLS. The patient goes to bed at variable times - cyclic insomnia, days with insomnia and alternating with hypersomnia. The patient endorsed the Epworth Sleepiness Scale at 18/24 points.   The  patient's weight 243 pounds with a height of 61 (inches), resulting in a BMI of 45.8 kg/m2. The patient's neck circumference measured 15 inches.  CURRENT MEDICATIONS: Xanax, Aspirin, Lioresal, Wellbutrin, Coreg, Zyrtec, Voltaren, Benadryl, Epipen, Nexium, Pepcid, Neurontin, Glucotrol, Synthroid, Cozaar, Antivert, Metformin, Singulair, Zofran, Requip, Ultram, Effexor, Desyrel   PROCEDURE:  This is a multichannel digital polysomnogram utilizing the Somnostar 11.2 system.  Electrodes and sensors were applied and monitored per AASM Specifications.   EEG, EOG, Chin and Limb EMG, were sampled at 200 Hz.  ECG, Snore and Nasal Pressure, Thermal Airflow, Respiratory Effort, CPAP Flow and Pressure, Oximetry was sampled at 50 Hz. Digital video and audio were recorded.      BASELINE STUDY Lights Out was at 21:54 and Lights On at 05:01.  Total recording time (TRT) was 427.5 minutes, with a total sleep time (TST) of 340.5 minutes.   The patient's sleep latency was 72.5 minutes.  REM latency was 203.5 minutes.  The sleep efficiency was 79.6 %.     SLEEP ARCHITECTURE: WASO (Wake after sleep onset) was 29.5 minutes.  There were 25.5 minutes in Stage N1, 28.5 minutes Stage N2, 281.5 minutes Stage N3 and 5 minutes in Stage REM.  The percentage of Stage N1 was 7.5%, Stage N2 was 8.4%, Stage N3 was 82.7% and Stage R (REM sleep) was 1.5%.   RESPIRATORY ANALYSIS:  There were a total of 52 respiratory events:  0 obstructive apneas, 0 central apneas and 0 mixed apneas with a total of 0 apneas and an apnea index (AI) of 0 /hour. There were  52 hypopneas with a hypopnea index of 9.2 /hour.      The total APNEA/HYPOPNEA INDEX (AHI) was 9.2/hour.  4 events occurred in REM sleep and 96 events in NREM. The REM AHI was  48 /hour, versus a non-REM AHI of 8.6. The patient spent 0 minutes of total sleep time in the supine position and 341 minutes in non-supine.. The supine AHI was 0.0 versus a non-supine AHI of 9.2.  OXYGEN SATURATION  & C02:  The Wake baseline 02 saturation was 92%, with the lowest being 75%. Time spent below 89% saturation equaled 334 minutes.  The arousals were noted as: 47 were spontaneous, 0 were associated with PLMs, 10 were associated with respiratory events. The patient had a total of 0 Periodic Limb Movements Audio and video analysis did not show any abnormal or unusual movements, behaviors, phonations or vocalizations.  Loud Snoring was noted. Nocturia times one.  EKG was in keeping with normal sinus rhythm (NSR).  IMPRESSION:  1. Mild Sleep Hypopnea disorder with loud snoring, qualified as UARS with OSA. Suspicious for obesity hypoventilation. 2. Severe sleep hypoxia- this is related to 334 minutes of hypoxemia, almost the whole time of sleep recorded.  3. Loud Primary Snoring  RECOMMENDATIONS:  1. Advise full-night, attended, CPAP titration study to optimize therapy.  Once we can reach an AHI of 0/h will evaluate if additional oxygen is needed.  2. Please fit the patient carefully for an interface she can tolerate and document result.    I certify that I have reviewed the entire raw data recording prior to the issuance of this report in accordance with the Standards of Accreditation of the American Academy of Sleep Medicine (AASM)  Larey Seat, MD Diplomat, American Board of Psychiatry and Neurology  Diplomat, American Board of Sleep Medicine Market researcher, [facility] [employer]

## 2019-09-23 NOTE — Progress Notes (Signed)
  1. Mild Sleep Hypopnea disorder with loud snoring, qualified as UARS with OSA. Suspicious for obesity hypoventilation. 2. Severe sleep hypoxia- this is related to 334 minutes of hypoxemia, almost the whole time of sleep recorded.  3. Loud Primary Snoring  RECOMMENDATIONS:  1. Advise full-night, attended, CPAP titration study to optimize therapy.  Once we can reach an AHI of 0/h will evaluate if additional oxygen is needed.  2. Please fit the patient carefully for an interface she can tolerate and document result.

## 2019-09-27 NOTE — Telephone Encounter (Signed)
Patient called. Documented in 09/09/2019 encounter

## 2019-09-29 ENCOUNTER — Telehealth: Payer: Self-pay | Admitting: Neurology

## 2019-09-29 ENCOUNTER — Ambulatory Visit: Payer: Medicare Other | Admitting: Family

## 2019-09-29 NOTE — Telephone Encounter (Signed)
-----   Message from Darleen Crocker, RN sent at 09/29/2019 11:56 AM EDT -----  ----- Message ----- From: Gildardo Griffes, RN Sent: 09/27/2019   5:38 PM EDT To: Gildardo Griffes, RN

## 2019-09-29 NOTE — Telephone Encounter (Signed)

## 2019-10-05 ENCOUNTER — Telehealth: Payer: Self-pay

## 2019-10-05 NOTE — Telephone Encounter (Signed)
LVM for pt to call me back to schedule sleep study  

## 2019-10-06 ENCOUNTER — Other Ambulatory Visit: Payer: Self-pay | Admitting: Neurology

## 2019-10-06 ENCOUNTER — Other Ambulatory Visit (HOSPITAL_COMMUNITY): Payer: Self-pay | Admitting: Psychiatry

## 2019-10-06 ENCOUNTER — Other Ambulatory Visit: Payer: Self-pay | Admitting: Cardiology

## 2019-10-14 ENCOUNTER — Other Ambulatory Visit: Payer: Self-pay | Admitting: "Endocrinology

## 2019-10-14 ENCOUNTER — Other Ambulatory Visit: Payer: Self-pay | Admitting: Neurology

## 2019-10-20 ENCOUNTER — Other Ambulatory Visit: Payer: Self-pay

## 2019-10-20 ENCOUNTER — Ambulatory Visit (INDEPENDENT_AMBULATORY_CARE_PROVIDER_SITE_OTHER): Payer: Medicare Other | Admitting: Neurology

## 2019-10-20 DIAGNOSIS — F99 Mental disorder, not otherwise specified: Secondary | ICD-10-CM

## 2019-10-20 DIAGNOSIS — G4711 Idiopathic hypersomnia with long sleep time: Secondary | ICD-10-CM

## 2019-10-20 DIAGNOSIS — G4733 Obstructive sleep apnea (adult) (pediatric): Secondary | ICD-10-CM

## 2019-10-20 DIAGNOSIS — G4734 Idiopathic sleep related nonobstructive alveolar hypoventilation: Secondary | ICD-10-CM

## 2019-10-20 DIAGNOSIS — E662 Morbid (severe) obesity with alveolar hypoventilation: Secondary | ICD-10-CM

## 2019-10-26 ENCOUNTER — Ambulatory Visit (INDEPENDENT_AMBULATORY_CARE_PROVIDER_SITE_OTHER): Payer: Medicare Other | Admitting: Psychiatry

## 2019-10-26 ENCOUNTER — Telehealth (INDEPENDENT_AMBULATORY_CARE_PROVIDER_SITE_OTHER): Payer: Self-pay | Admitting: Internal Medicine

## 2019-10-26 ENCOUNTER — Other Ambulatory Visit: Payer: Self-pay

## 2019-10-26 DIAGNOSIS — F331 Major depressive disorder, recurrent, moderate: Secondary | ICD-10-CM | POA: Diagnosis not present

## 2019-10-26 NOTE — Progress Notes (Signed)
Virtual Visit via Telephone Note  I connected with Norma [name] on 10/26/19 at 3:10 PM  by telephone and verified that I am speaking with the correct person using two identifiers.   I discussed the limitations, risks, security and privacy concerns of performing an evaluation and management service by telephone and the availability of in person appointments. I also discussed with the patient that there may be a patient responsible charge related to this service. The patient expressed understanding and agreed to proceed.    I provided 45 minutes of non-face-to-face time during this encounter.   [name], LCSW     THERAPIST PROGRESS NOTE   Location: Patient - Home / Provider - [facility] office   Session Time: Tuesday 10/26/2019 3:10 PM -  3:55 PM     Particip   ation Level: Active  Behavioral Response: Tearful, Depressed   Type of Therapy: Individual Therapy  Treatment Goals addressed: Began healthy grieving process   Interventions: CBT and Supportive  Summary: Norma [name] is a 69 y.o. female who is referred for services by psychiatrist Dr. Harrington Challenger  due to experiencing symptoms of anxiety and depression. 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  She reports at least 3 psychiatric hospitlaizations with the last one occuring several years ago at [facility]. She is a returning patient to this writer/clinician as she was seen once in 2017. She reports problems with insurance coverage prevented her from returning to treatment sooner. She states being more depressed and just not being as calm as she used to be. 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 She reports ruminating about the past as she and the owner of the facility used to be friends but had conflict that led to patient leaving her job. She reports financial stress due to several bills. She also reports stress regarding her nephews' dogs as they have damaged her home.    Patient last was seen about 5 months ago.  She is experiencing moderate to severe symptoms of depression along with grief and loss issues.  Per her report, her brother 44 year old brother died on May 85 and her 27 year old mother died on 09-24-2022 of this year.  Patient expresses sadness and frustration as mother was in intensive care for 3 weeks and patient was not allowed to see her during that time.  She also reports feeling excluded from planning mother's funeral and reports continuing to feel excluded and not needed regarding care for her father.  Patient also expresses guilt regarding mother's death as she and her mother did not have a good relationship.  Patient's current symptoms include include depressed mood, crying spells, diminished interest and pleasure in doing things, excessive spending, and isolated behaviors.    Suicidal/Homicidal: Nowithout intent/plan  Therapist Response: reviewed symptoms, discussed stressors, facilitated expression of thoughts and feelings, validated, and normalized feelings related to grief and loss, assisted patient identify support system, began to identify goals for treatment, will continue to develop treatment plan next session, patient is scheduled to see psychiatrist Dr. Harrington Challenger next week for medication management  Plan: Return in 2 weeks  Diagnosis: Axis I: MDD, moderate    Axis II: Deferred    [name], LCSW 10/26/2019

## 2019-10-26 NOTE — Telephone Encounter (Signed)
Patient called regarding lab work  -  Please advise - [phone]

## 2019-10-27 NOTE — Telephone Encounter (Signed)
Patient was called and a message was left on voicemail. Ask that she call us back about what she is needing for Lab Work or she may send to [place] a MY CHART message.

## 2019-11-03 ENCOUNTER — Encounter: Payer: Self-pay | Admitting: Neurology

## 2019-11-03 ENCOUNTER — Other Ambulatory Visit (INDEPENDENT_AMBULATORY_CARE_PROVIDER_SITE_OTHER): Payer: Self-pay

## 2019-11-03 ENCOUNTER — Encounter (INDEPENDENT_AMBULATORY_CARE_PROVIDER_SITE_OTHER): Payer: Self-pay

## 2019-11-03 DIAGNOSIS — I200 Unstable angina: Secondary | ICD-10-CM

## 2019-11-03 DIAGNOSIS — D649 Anemia, unspecified: Secondary | ICD-10-CM

## 2019-11-04 ENCOUNTER — Telehealth: Payer: Medicare Other | Admitting: Internal Medicine

## 2019-11-08 DIAGNOSIS — Z0189 Encounter for other specified special examinations: Secondary | ICD-10-CM | POA: Diagnosis not present

## 2019-11-08 DIAGNOSIS — F5101 Primary insomnia: Secondary | ICD-10-CM | POA: Diagnosis not present

## 2019-11-08 DIAGNOSIS — J410 Simple chronic bronchitis: Secondary | ICD-10-CM | POA: Diagnosis not present

## 2019-11-08 DIAGNOSIS — G2581 Restless legs syndrome: Secondary | ICD-10-CM | POA: Diagnosis not present

## 2019-11-08 DIAGNOSIS — E1121 Type 2 diabetes mellitus with diabetic nephropathy: Secondary | ICD-10-CM | POA: Diagnosis not present

## 2019-11-08 DIAGNOSIS — F3181 Bipolar II disorder: Secondary | ICD-10-CM | POA: Diagnosis not present

## 2019-11-08 DIAGNOSIS — E782 Mixed hyperlipidemia: Secondary | ICD-10-CM | POA: Diagnosis not present

## 2019-11-08 DIAGNOSIS — Z8673 Personal history of transient ischemic attack (TIA), and cerebral infarction without residual deficits: Secondary | ICD-10-CM | POA: Diagnosis not present

## 2019-11-08 DIAGNOSIS — I5022 Chronic systolic (congestive) heart failure: Secondary | ICD-10-CM | POA: Diagnosis not present

## 2019-11-08 LAB — HEMOGLOBIN A1C: Hemoglobin A1C: 6.6

## 2019-11-08 LAB — BASIC METABOLIC PANEL
BUN: 21 (ref 4–21)
Creatinine: 1.4 — AB (ref 0.5–1.1)

## 2019-11-08 LAB — LIPID PANEL
Cholesterol: 129 (ref 0–200)
HDL: 60 (ref 35–70)
LDL Cholesterol: 43
Triglycerides: 158 (ref 40–160)

## 2019-11-09 ENCOUNTER — Telehealth: Payer: Self-pay | Admitting: Neurology

## 2019-11-09 DIAGNOSIS — G4733 Obstructive sleep apnea (adult) (pediatric): Secondary | ICD-10-CM

## 2019-11-09 DIAGNOSIS — G4711 Idiopathic hypersomnia with long sleep time: Secondary | ICD-10-CM

## 2019-11-09 NOTE — Telephone Encounter (Signed)
-----   Message from Larey Seat, MD sent at 11/09/2019  2:04 PM EDT ----- The patient was fitted with a Small Vitera full face mask and given 2 liters of Oxygen under 14 cm water pressure of CPAP.  DIAGNOSIS 1. Obstructive Sleep Apnea responded to CPAP at 14 cm water but required additional oxygen at 2 liters due to continued hypoxia.  2. No Central Sleep Apnea was seen. 3. Sleep Related Hypoxemia was corrected under CPAP and 2 liters of 02.  4. Non-specific abnormal EKG  PLANS/RECOMMENDATIONS: 1. The patient will use an autotitration CPAP device at 6-16 cm water pressure with 2 cm Water EPR and Vitera small FFM. Added oxygen will be ordered at 2 liters.  2. CPAP therapy complicate is defined as 4 hours or more of nightly use.  3. Any apnea patient should avoid sedatives, hypnotics, and alcohol consumption at bedtime.   DISCUSSION: we need to see Norma [name] in a 60 day revisit with download of data and additional ONO, also need Epworth score which hopefully will reflect improved conditions.

## 2019-11-09 NOTE — Progress Notes (Signed)
The patient was fitted with a Small Vitera full face mask and given 2 liters of Oxygen under 14 cm water pressure of CPAP.  DIAGNOSIS 1. Obstructive Sleep Apnea responded to CPAP at 14 cm water but required additional oxygen at 2 liters due to continued hypoxia.  2. No Central Sleep Apnea was seen. 3. Sleep Related Hypoxemia was corrected under CPAP and 2 liters of 02.  4. Non-specific abnormal EKG  PLANS/RECOMMENDATIONS: 1. The patient will use an autotitration CPAP device at 6-16 cm water pressure with 2 cm Water EPR and Vitera small FFM. Added oxygen will be ordered at 2 liters.  2. CPAP therapy complicate is defined as 4 hours or more of nightly use.  3. Any apnea patient should avoid sedatives, hypnotics, and alcohol consumption at bedtime.   DISCUSSION: we need to see Norma [name] in a 60 day revisit with download of data and additional ONO, also need Epworth score which hopefully will reflect improved conditions.

## 2019-11-09 NOTE — Procedures (Signed)
PATIENT'S NAME:  Norma [name], Norma [name] DOB:      09-08-1950      MR#:    [id number]     DATE OF RECORDING: 10/20/2019 REFERRING M.D.:  Emi Belfast, MD Study Performed:   Titration to positive airway pressure  HISTORY:  This 69 year- old Caucasian female patient and is seen here as a referral on 08/16/2019 from Dr. [name].  Chief concern according to patient : [name], who prefers to go by the name' Debbie', reports that she had used her CPAP compliantly for several years before her dog attacked the mask and headgear.  She called the DME and was promised to get supplies but apparently waited passively for several months for supplies that were never delivered.  She then learned that she was deemed noncompliant because she had not used her CPAP. She is excessively daytime sleepy. Diagnostic polysomnogram performed on 09/14/2019 revealed: Mild Sleep Hypopnea disorder with loud snoring, qualified as UARS with OSA. Suspicious for obesity hypoventilation. Severe sleep hypoxia- this is related to 334 minutes of hypoxemia, almost the whole time of sleep recorded. Loud Primary Snoring  The patient endorsed the Epworth Sleepiness Scale at 18/24 points.   The patient's weight 243 pounds with a height of 61 (inches), resulting in a BMI of 45.8 kg/m2. The patient's neck circumference measured 15 inches.  CURRENT MEDICATIONS: Xanax, Aspirin, Lioresal, Wellbutrin, Coreg, Zyrtec, Voltaren, Benadryl, EpiPen, Nexium, Pepcid, Neurontin, Glucotrol, Synthroid, Cozaar, Antivert, Metformin, Singulair, Zofran, Requip, Ultram, Effexor, Desyrel   PROCEDURE:  This is a multichannel digital polysomnogram utilizing the SomnoStar 11.2 system.  Electrodes and sensors were applied and monitored per AASM Specifications.   EEG, EOG, Chin and Limb EMG, were sampled at 200 Hz.  ECG, Snore and Nasal Pressure, Thermal Airflow, Respiratory Effort, CPAP Flow and Pressure, Oximetry was sampled at 50 Hz. Digital video and audio were recorded.      CPAP  was initiated at 5 cmH20 with heated humidity per AASM split night standards and pressure was advanced to 14 cmH20 with EPR at 2cm water - because of hypopneas, apneas and desaturations.  Oxygen at 2 liters was added as apneas were controlled but hypoxia continued under 14 cm water. A small size Vitera FFM was used to control oral airflow.  At a PAP pressure of 14 cmH20, there was a reduction of the AHI to 3.1/h with improvement of sleep apnea.  Lights Out was at 21:53 and Lights On at 04:51. Total recording time (TRT) was 418.5 minutes, with a total sleep time (TST) of 231 minutes. The patient's sleep latency was 70.5 minutes. REM latency was 310.5 minutes.  The sleep efficiency was 55.2 %.    SLEEP ARCHITECTURE: WASO (Wake after sleep onset) was 135.5 minutes. There were 54 minutes in Stage N1, 130 minutes Stage N2, 15 minutes Stage N3 and 32 minutes in Stage REM.  The percentage of Stage N1 was 23.4%, Stage N2 was 56.3%, Stage N3 was 6.5% and Stage R (REM sleep) was 13.9%.    RESPIRATORY ANALYSIS:  There was a total of 18 respiratory events: 3 obstructive apneas, 0 central apneas and 4 mixed apneas with 11 hypopneas.     The total APNEA/HYPOPNEA INDEX (AHI) was 4.7 /hour . 0 events occurred in REM sleep and 18 events in NREM. The REM AHI was 0 /hour versus a non-REM AHI of 5.4 /hour.  The patient spent 80 minutes of total sleep time in the supine position and 151 minutes in non-supine. The supine AHI  was 3.8, versus a non-supine AHI of 5.2/h.  OXYGEN SATURATION & C02:  The baseline 02 saturation was 90%, with the lowest being 83%. Time spent below 89% saturation equaled still 37 minutes. The arousals were noted as: 39 were spontaneous, 0 were associated with PLMs, 7 were associated with respiratory events. The patient had a total of 5 Periodic Limb Movements. The Periodic Limb Movement (PLM) Arousal index was 0 /hour. Audio and video analysis did not show any abnormal or unusual movements,  behaviors, phonations or vocalizations. Snoring was noted. Irregular EKG. The patient was fitted with a Small Vitera full face mask and given 2 liters of Oxygen under 14 cm water pressure of CPAP.  DIAGNOSIS 1. Obstructive Sleep Apnea responded to CPAP at 14 cm water but required additional oxygen at 2 liters due to continued hypoxia.  2. No Central Sleep Apnea was seen. 3. Sleep Related Hypoxemia was corrected under CPAP and 2 liters of 02.  4. Non-specific abnormal EKG  PLANS/RECOMMENDATIONS: 1. The patient will use an autotitration CPAP device at 6-16 cm water pressure with 2 cm Water EPR and Vitera small FFM. Added oxygen will be ordered at 2 liters.  2. CPAP therapy complicate is defined as 4 hours or more of nightly use.  3. Any apnea patient should avoid sedatives, hypnotics, and alcohol consumption at bedtime.   DISCUSSION: we need to see Mrs. Boddy in a 60 day revisit with download of data and additional ONO, also need Epworth score which hopefully will reflect improved conditions.     A follow up appointment will be scheduled in the Sleep Clinic at [facility].   Please call [phone] with any questions.      I certify that I have reviewed the entire raw data recording prior to the issuance of this report in accordance with the Standards of Accreditation of the American Academy of Sleep Medicine (AASM)    Larey Seat, M.D. Diplomat, Tax adviser of Psychiatry and Neurology  Diplomat, Tax adviser of Sleep Medicine Market researcher, [employer] [facility] at [employer]

## 2019-11-09 NOTE — Addendum Note (Signed)
Addended by: Larey Seat on: 11/09/2019 02:04 PM   Modules accepted: Orders

## 2019-11-09 NOTE — Telephone Encounter (Signed)
I called Norma [name]. I advised Norma [name] that Dr. Brett Fairy reviewed their sleep study results and found that Norma [name] was . Dr. Brett Fairy recommends that Norma [name] tolerates the auto CPAP. I reviewed PAP compliance expectations with the Norma [name]. Norma [name] is agreeable to starting a CPAP. I advised Norma [name] that an order will be sent to a DME,[facility], and [facility] will call the Norma [name] within about one week after they file with the Norma [name]'s insurance. [place] will show the Norma [name] how to use the machine, fit for masks, and troubleshoot the CPAP if needed. A follow up appt was made for insurance purposes with Butler Denmark, NP on Oct 5,2021 at 2:15 pm. Norma [name] verbalized understanding to arrive 15 minutes early and bring their CPAP. A letter with all of this information in it will be mailed to the Norma [name] as a reminder. I verified with the Norma [name] that the address we have on file is correct. Norma [name] verbalized understanding of results. Norma [name] had no questions at this time but was encouraged to call back if questions arise. I have sent the order to  and have received confirmation that they have received the order.

## 2019-11-10 ENCOUNTER — Telehealth (INDEPENDENT_AMBULATORY_CARE_PROVIDER_SITE_OTHER): Payer: Medicare Other | Admitting: Psychiatry

## 2019-11-10 ENCOUNTER — Encounter (HOSPITAL_COMMUNITY): Payer: Self-pay | Admitting: Psychiatry

## 2019-11-10 ENCOUNTER — Other Ambulatory Visit: Payer: Self-pay

## 2019-11-10 DIAGNOSIS — F331 Major depressive disorder, recurrent, moderate: Secondary | ICD-10-CM | POA: Diagnosis not present

## 2019-11-10 DIAGNOSIS — I200 Unstable angina: Secondary | ICD-10-CM

## 2019-11-10 MED ORDER — VENLAFAXINE HCL ER 150 MG PO CP24: 150.0000 mg | ORAL_CAPSULE | Freq: Every morning | ORAL | 2 refills | Status: DC

## 2019-11-10 MED ORDER — ALPRAZOLAM 0.25 MG PO TABS: 0.2500 mg | ORAL_TABLET | Freq: Two times a day (BID) | ORAL | 2 refills | Status: DC | PRN

## 2019-11-10 MED ORDER — LAMOTRIGINE 150 MG PO TABS: 150.0000 mg | ORAL_TABLET | Freq: Every day | ORAL | 2 refills | Status: DC

## 2019-11-10 MED ORDER — TRAZODONE HCL 150 MG PO TABS: 150.0000 mg | ORAL_TABLET | Freq: Every day | ORAL | 2 refills | Status: DC

## 2019-11-10 MED ORDER — BUPROPION HCL ER (XL) 300 MG PO TB24: 300.0000 mg | ORAL_TABLET | Freq: Every morning | ORAL | 2 refills | Status: DC

## 2019-11-10 NOTE — Progress Notes (Signed)
Virtual Visit via Telephone Note  I connected with Norma [name] on 11/10/19 at 11:40 AM EDT by telephone and verified that I am speaking with the correct person using two identifiers.   I discussed the limitations, risks, security and privacy concerns of performing an evaluation and management service by telephone and the availability of in person appointments. I also discussed with the patient that there may be a patient responsible charge related to this service. The patient expressed understanding and agreed to proceed.    I discussed the assessment and treatment plan with the patient. The patient was provided an opportunity to ask questions and all were answered. The patient agreed with the plan and demonstrated an understanding of the instructions.   The patient was advised to call back or seek an in-person evaluation if the symptoms worsen or if the condition fails to improve as anticipated.  I provided 15 minutes of non-face-to-face time during this encounter. Location: Provider Home, patient home  [name], MD  [facility] MD/PA/NP OP Progress Note  11/10/2019 12:04 PM Norma [name]  MRN:  [id number]  Chief Complaint:  Chief Complaint    Anxiety; Depression; Follow-up     HPI: This patient is a 69 year old single white female who lives alone in [place]. 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 She was a patient here but was last seen in 2017. She states that she quit coming because of financial reasons.  Since then the patient has been followed by her primary doctor. She has had other health issues arise and needed CABG. She is having severe problems with knee pain and is trying to lose enough weight to have a knee replacement surgery. She also has chronic back pain. Her psychiatric medications were not really changed much since she last saw me. She states that she still goes to periods of significant depression but most of it sounds situational.  Currently her  94 year old nephew is staying at her house. She states that he is rude and had been cursing and berating her. She finally put her foot down and got them to stop. She often has conflicts with her parents and even her sister. All of this gets her depressed. After she had been hospitalized for cardiac issues she states that her church did not do anything for her encouraged her to come back and this was very disappointing and hurtful. She has been more isolated lately.  She has been compliant with her medications which include Effexor XR Wellbutrin and Lamictal. She is trying to do a little bit of light housework. Her nephew brought dogs into the home and she enjoys the dogs. She does not currently have any thoughts of self-harm or suicide. She is not sleeping well partly due to chronic pain and partly due to having to urinate because of her blood pressure pill. She tried trazodone 50 mg but it is not helping so I suggested we increase it  The patient returns for follow-up after 3 months.  Since I saw her she has had a sleep study and she has been diagnosed with sleep apnea with hypoxia.  She is going to need to start using CPAP again with 2 L of oxygen as well.  She has not gotten this from the drugstore yet but should be getting it by Friday.  She is still not sleeping well with the trazodone so I offered to increase it a little bit but I think overall the CPAP will help her more than  anything else.  Her mood has been down for a while because she lost her mother and her brother back in May.  She has spoken to [employer] in our office and is feeling better now.  Overall her mood has been stable.  She does wake up at night and feels like she needs to do things so I offered to restart the low-dose of Xanax 0.25 mg that she can take for anxiety or if she cannot sleep in the middle of the night. Visit Diagnosis:    ICD-10-CM   1. Moderate episode of recurrent major depressive disorder ([place])  F33.1      Past Psychiatric History: She was hospitalized in 2001 and went through a course of ECT.  She has been tried on [employer] Wellbutrin and Zyprexa  Past Medical History:  Past Medical History:  Diagnosis Date  . Anemia   . Anxiety   . Bipolar disorder ([place])   . Bulging lumbar disc    L3-4  . Chronic daily headache   . Chronic low back pain 09/20/2014  . COPD (chronic obstructive pulmonary disease) ([place])   . Degenerative arthritis   . Depression   . Diabetes mellitus, type II ([place])   . Diabetic neuropathy ([place]) 09/16/2018  . DM type 2 with diabetic peripheral neuropathy ([place]) 09/20/2014  . Dyslipidemia   . Dyspnea   . Gastroparesis   . GERD (gastroesophageal reflux disease)   . Heart murmur   . History of hiatal hernia   . Hypothyroidism   . IBS (irritable bowel syndrome)   . Memory difficulty 09/20/2014  . Morbid obesity ([place])   . Neuropathy   . Obstructive sleep apnea on CPAP   . Restless legs syndrome (RLS) 09/17/2012  . Stroke (cerebrum) ([place]) 05/16/2017   Left parietal  . Urticaria     Past Surgical History:  Procedure Laterality Date  . ABDOMINAL HYSTERECTOMY    . ARTHROSCOPY KNEE W/ DRILLING  06/2011   and Decemer of 2013.  [name] Carpel tunnel  1980's  . CATARACT EXTRACTION W/PHACO Right 03/21/2017   Procedure: CATARACT EXTRACTION PHACO AND INTRAOCULAR LENS PLACEMENT RIGHT EYE;  Surgeon: Baruch Goldmann, MD;  Location: [facility];  Service: Ophthalmology;  Laterality: Right;  CDE: 2.91   . CATARACT EXTRACTION W/PHACO Left 04/18/2017   Procedure: CATARACT EXTRACTION PHACO AND INTRAOCULAR LENS PLACEMENT LEFT EYE;  Surgeon: Baruch Goldmann, MD;  Location: [facility];  Service: Ophthalmology;  Laterality: Left;  left  . CHOLECYSTECTOMY    . COLONOSCOPY WITH PROPOFOL N/A 10/04/2016   Procedure: COLONOSCOPY WITH PROPOFOL;  Surgeon: Rogene Houston, MD;  Location[facility];  Service: Endoscopy;  Laterality: N/A;  11:10  . CORONARY ARTERY BYPASS GRAFT N/A 08/29/2017   Procedure:  CORONARY ARTERY BYPASS GRAFTING (CABG) x 3 WITH ENDOSCOPIC HARVESTING OF RIGHT SAPHENOUS VEIN;  Surgeon: Ivin Poot, MD;  Location: [place];  Service: Open Heart Surgery;  Laterality: N/A;  . ESOPHAGOGASTRODUODENOSCOPY N/A 04/25/2016   Procedure: ESOPHAGOGASTRODUODENOSCOPY (EGD);  Surgeon: Rogene Houston, MD;  [facility];  Service: Endoscopy;  Laterality: N/A;  730  . LEFT HEART CATH AND CORONARY ANGIOGRAPHY N/A 08/27/2017   Procedure: LEFT HEART CATH AND CORONARY ANGIOGRAPHY;  Surgeon: Sherren Mocha, MD;  Location: [place] [facility];  Service: Cardiovascular::  pLAD 95% - p-mLAD 50%, ostD1 90% -pD1 80%; ostOM1 90%; rPDA 80%. EF ~50-55% - HK of dital Anterolateral & Apical wall.  - Rec CVTS c/s  . RECTAL SURGERY  fissure  . SHOULDER SURGERY Left    arthroscopy in March of this year  . TEE WITHOUT CARDIOVERSION N/A 08/29/2017   Procedure: TRANSESOPHAGEAL ECHOCARDIOGRAM (TEE);  Surgeon: Prescott Gum, Collier Salina, MD;  Location: [place];  Service: Open Heart Surgery;  Laterality: N/A;  . TRANSTHORACIC ECHOCARDIOGRAM  06/06/2017   Mild to moderate reduced EF 40 and 45%.  Anterior septal, inferoseptal and basal to mid inferior hypokinesis.  GR 1 DD.  No significant valvular lesion    Family Psychiatric History: see below  Family History:  Family History  Problem Relation Age of Onset  . Hypertension Mother   . Lymphoma Mother   . Depression Mother   . Arthritis Father   . Alcohol abuse Father   . Hypertension Sister   . Cancer Brother        kidney and lung  . Alcohol abuse Brother   . Alcohol abuse Paternal Uncle   . Alcohol abuse Paternal Grandfather   . Alcohol abuse Paternal Grandmother   . Allergic rhinitis Neg Hx   . Angioedema Neg Hx   . Asthma Neg Hx   . Atopy Neg Hx   . Eczema Neg Hx   . Immunodeficiency Neg Hx   . Urticaria Neg Hx     Social History:  Social History   Socioeconomic History  . Marital status: Single    Spouse name: Not on file  . Number of  children: 0  . Years of education: 29  . Highest education level: Not on file  Occupational History    Employer: [employer]  Tobacco Use  . Smoking status: Former Smoker    Types: Cigarettes    Quit date: 02/04/1971    Years since quitting: 48.7  . Smokeless tobacco: Never Used  . Tobacco comment: smoked 2 cigarettes a day  Vaping Use  . Vaping Use: Never used  Substance and Sexual Activity  . Alcohol use: No    Alcohol/week: 0.0 standard drinks  . Drug use: No  . Sexual activity: Never  Other Topics Concern  . Not on file  Social History Narrative   Patient lives at home alone.    Patient has no children.    Patient has her masters in nursing.    Patient is single.    Patient drinks about 2 glasses of tea daily.   Patient is right handed.   Social Determinants of Health   Financial Resource Strain:   . Difficulty of Paying Living Expenses:   Food Insecurity:   . Worried About Charity fundraiser in the Last Year:   . Arboriculturist in the Last Year:   Transportation Needs:   . Film/video editor (Medical):   [name] Lack of Transportation (Non-Medical):   Physical Activity:   . Days of Exercise per Week:   . Minutes of Exercise per Session:   Stress:   . Feeling of Stress :   Social Connections:   . Frequency of Communication with Friends and Family:   . Frequency of Social Gatherings with Friends and Family:   . Attends Religious Services:   . Active Member of Clubs or Organizations:   . Attends Archivist Meetings:   [name] Marital Status:     Allergies:  Allergies  Allergen Reactions  . Amoxicillin Anaphylaxis and Other (See Comments)    Has patient had a PCN reaction causing immediate rash, facial/tongue/throat swelling, SOB or lightheadedness with hypotension: Yes Has patient had a PCN  reaction causing severe rash involving mucus membranes or skin necrosis: Yes Has patient had a PCN reaction that required hospitalization: Yes Has  patient had a PCN reaction occurring within the last 10 years: Yes If all of the above answers are "NO", then may proceed with Cephalosporin use.   [name] Hydrocodone Anaphylaxis  . Percocet [Oxycodone-Acetaminophen] Other (See Comments)    Causes chest pain /tightness. Pressure pain around her ribs.  . Vancomycin Itching  . Depacon [Valproic Acid] Other (See Comments)    Causes falls   . Iron Nausea And Vomiting    Metabolic Disorder Labs: Lab Results  Component Value Date   HGBA1C 6.8 (H) 03/05/2019   MPG 148 03/05/2019   MPG 136.98 10/30/2018   No results found for: PROLACTIN Lab Results  Component Value Date   CHOL 248 (H) 03/05/2019   TRIG 175 (H) 03/05/2019   HDL 65 03/05/2019   CHOLHDL 3.8 03/05/2019   VLDL 31 (H) 07/10/2016   LDLCALC 152 (H) 03/05/2019   Jenkins 96 04/24/2018   Lab Results  Component Value Date   TSH 1.49 03/05/2019   TSH 2.671 10/30/2018    Therapeutic Level Labs: No results found for: LITHIUM No results found for: VALPROATE No components found for:  CBMZ  Current Medications: Current Outpatient Medications  Medication Sig Dispense Refill  . ALPRAZolam (XANAX) 0.25 MG tablet Take 1 tablet (0.25 mg total) by mouth 2 (two) times daily as needed for anxiety. 60 tablet 2  . aspirin EC 81 MG tablet Take 1 tablet (81 mg total) by mouth daily. 90 tablet 3  . baclofen (LIORESAL) 10 MG tablet TAKE ONE TABLET BY MOUTH AT BEDTIME. 30 tablet 6  . buPROPion (WELLBUTRIN XL) 300 MG 24 hr tablet Take 1 tablet (300 mg total) by mouth every morning. 30 tablet 2  . calcium carbonate (TUMS - DOSED IN MG ELEMENTAL CALCIUM) 500 MG chewable tablet Chew 2 tablets by mouth as needed for indigestion or heartburn.     . carvedilol (COREG) 6.25 MG tablet TAKE ONE TABLET BY MOUTH TWICE DAILY (MORNING ,EVENING) 180 tablet 2  . cetirizine (ZYRTEC) 10 MG tablet Take 1 tablet tablet 1-2 times daily. (Patient taking differently: Take 10 mg by mouth daily. Take 1 tablet tablet 1-2  times daily.) 60 tablet 5  . diclofenac Sodium (VOLTAREN) 1 % GEL Apply topically 4 (four) times daily.    . diphenhydrAMINE (BENADRYL) 25 mg capsule Take 25 mg by mouth as needed.    [name] EPINEPHrine (EPIPEN 2-PAK) 0.3 mg/0.3 mL IJ SOAJ injection Inject 0.3 mg into the muscle once.    [name] esomeprazole (NEXIUM) 40 MG capsule Take 40 mg by mouth daily before supper.   12  . Evolocumab (REPATHA SURECLICK) 740 MG/ML SOAJ Inject 1 Dose into the skin every 14 (fourteen) days. 2 pen 11  . famotidine (PEPCID) 20 MG tablet Take 1 tablet (20 mg total) by mouth daily. (Patient taking differently: Take 20 mg by mouth at bedtime. ) 30 tablet 5  . gabapentin (NEURONTIN) 400 MG capsule Take 1 capsule (400 mg total) by mouth 3 (three) times daily.    [name] glipiZIDE (GLUCOTROL XL) 5 MG 24 hr tablet TAKE ONE TABLET BY MOUTH DAILY WITH BREAKFAST 90 tablet 0  . lamoTRIgine (LAMICTAL) 150 MG tablet Take 1 tablet (150 mg total) by mouth at bedtime. 30 tablet 2  . levothyroxine (SYNTHROID, LEVOTHROID) 100 MCG tablet Take 100 mcg by mouth daily before breakfast.     . losartan (  COZAAR) 25 MG tablet TAKE ONE TABLET BY MOUTH DAILY (MORNING) 90 tablet 1  . meclizine (ANTIVERT) 25 MG tablet Take 1 tablet (25 mg total) by mouth 3 (three) times daily as needed for dizziness. (Patient taking differently: Take 25 mg by mouth daily as needed for dizziness. ) 30 tablet 0  . metFORMIN (GLUCOPHAGE) 500 MG tablet TAKE ONE TABLET BY MOUTH TWICE DAILY WITH A MEAL (MORNING ,EVENING) 180 tablet 0  . montelukast (SINGULAIR) 10 MG tablet     . ondansetron (ZOFRAN) 4 MG tablet Take 1 tablet (4 mg total) by mouth 2 (two) times daily as needed for nausea or vomiting. (Patient not taking: Reported on 10/26/2019) 30 tablet 1  . Pediatric Multiple Vitamins (FLINTSTONES MULTIVITAMIN PO) Take by mouth. (Patient not taking: Reported on 10/26/2019)    . POTASSIUM CHLORIDE PO  (Patient not taking: Reported on 10/26/2019)    . potassium chloride SA (KLOR-CON) 20  MEQ tablet TAKE ONE TABLET BY MOUTH EVERY DAY (MORNING-MON, WED,FRI ,AM PER PT) (Patient not taking: Reported on 10/26/2019) 90 tablet 2  . rOPINIRole (REQUIP) 3 MG tablet TAKE ONE TABLET BY MOUTH EVERY MORNING, ,NOON AND AT BEDTIME. (PRT PT MORNING,EVENING,BEDTIME) 270 tablet 1  . torsemide (DEMADEX) 20 MG tablet TAKE ONE TABLET BY MOUTH ALTERNATING WITH TWO TABLETS daily (PER PT ONE DAILY-EXTRA IN BOTTLE) 135 tablet 3  . traMADol (ULTRAM) 50 MG tablet Take 100 mg by mouth 4 (four) times daily as needed.     . traZODone (DESYREL) 150 MG tablet Take 1 tablet (150 mg total) by mouth at bedtime. 30 tablet 2  . venlafaxine XR (EFFEXOR-XR) 150 MG 24 hr capsule Take 1 capsule (150 mg total) by mouth in the morning. 60 capsule 2   No current facility-administered medications for this visit.     Musculoskeletal: Strength & Muscle Tone: within normal limits Gait & Station: normal Patient leans: N/A  Psychiatric Specialty Exam: Review of Systems  Psychiatric/Behavioral: Positive for sleep disturbance. The patient is nervous/anxious.   All other systems reviewed and are negative.   There were no vitals taken for this visit.There is no height or weight on file to calculate BMI.  General Appearance: NA  Eye Contact:  NA  Speech:  Clear and Coherent  Volume:  Normal  Mood:  Anxious  Affect:  NA  Thought Process:  Goal Directed  Orientation:  Full (Time, Place, and Person)  Thought Content: WDL   Suicidal Thoughts:  No  Homicidal Thoughts:  No  Memory:  Immediate;   Good Recent;   Good Remote;   Fair  Judgement:  Good  Insight:  Good  Psychomotor Activity:  Decreased  Concentration:  Concentration: Good and Attention Span: Good  Recall:  Good  Fund of Knowledge: Good  Language: Good  Akathisia:  No  Handed:  Right  AIMS (if indicated): not done  Assets:  Communication Skills Desire for Improvement Resilience Social Support Talents/Skills  ADL's:  Intact  Cognition: WNL  Sleep:   Poor   Screenings: Mini-Mental     Office Visit from 02/18/2018 in [facility] Office Visit from 09/17/2017 in [facility]  Total Score (max 30 points ) 28 28    PHQ2-9     Counselor from 05/17/2019 in [facility] Office Visit from 09/11/2017 in [facility] Office Visit from 04/22/2017 in [facility] Office Visit from 12/06/2016 in [facility] Office Visit from 07/16/2016 in [facility]  PHQ-2  Total Score 1 0 0 0 0       Assessment and Plan:  Patient is a 69 year old female with a history of depression anxiety mood swings and insomnia.  She is about to start a new type of CPAP with oxygen and I think this will help her sleep considerably.  We will also increase trazodone to 150 mg at bedtime.  She will continue Effexor XR 300 mg but move it to morning to prevent insomnia and continue Wellbutrin XL 300 mg daily for depression.  She will continue Lamictal 150 mg daily for mood swings and reinstate Xanax 0.25 mg twice daily as needed for anxiety.  She will return to see me in 6 weeks  [name], MD 11/10/2019, 12:04 PM

## 2019-11-17 ENCOUNTER — Ambulatory Visit: Payer: Medicare Other | Admitting: Nurse Practitioner

## 2019-11-17 DIAGNOSIS — E1122 Type 2 diabetes mellitus with diabetic chronic kidney disease: Secondary | ICD-10-CM | POA: Diagnosis not present

## 2019-11-17 DIAGNOSIS — G2581 Restless legs syndrome: Secondary | ICD-10-CM | POA: Diagnosis not present

## 2019-11-17 DIAGNOSIS — M7122 Synovial cyst of popliteal space [Baker], left knee: Secondary | ICD-10-CM | POA: Diagnosis not present

## 2019-11-17 DIAGNOSIS — N1832 Chronic kidney disease, stage 3b: Secondary | ICD-10-CM | POA: Diagnosis not present

## 2019-11-17 DIAGNOSIS — L501 Idiopathic urticaria: Secondary | ICD-10-CM | POA: Diagnosis not present

## 2019-11-17 DIAGNOSIS — J410 Simple chronic bronchitis: Secondary | ICD-10-CM | POA: Diagnosis not present

## 2019-11-17 DIAGNOSIS — I5022 Chronic systolic (congestive) heart failure: Secondary | ICD-10-CM | POA: Diagnosis not present

## 2019-11-17 DIAGNOSIS — Z8673 Personal history of transient ischemic attack (TIA), and cerebral infarction without residual deficits: Secondary | ICD-10-CM | POA: Diagnosis not present

## 2019-11-17 DIAGNOSIS — E782 Mixed hyperlipidemia: Secondary | ICD-10-CM | POA: Diagnosis not present

## 2019-11-17 DIAGNOSIS — F3181 Bipolar II disorder: Secondary | ICD-10-CM | POA: Diagnosis not present

## 2019-11-17 DIAGNOSIS — F5101 Primary insomnia: Secondary | ICD-10-CM | POA: Diagnosis not present

## 2019-11-18 ENCOUNTER — Ambulatory Visit: Payer: Medicare Other | Admitting: Nurse Practitioner

## 2019-11-18 ENCOUNTER — Telehealth: Payer: Self-pay | Admitting: Neurology

## 2019-11-18 NOTE — Telephone Encounter (Signed)
Called the patient back. She has stated that the had not started CPAP because [facility] was unable to start this for her. Advised I would forward this to [facility]. Provided her with their number and encouraged to call if she had not started her CPAP by sept 5. Pt verbalized understanding.

## 2019-11-18 NOTE — Telephone Encounter (Signed)
Pt is needing to speak to RN regarding several questions she has about her cpap machine. Please advise.

## 2019-11-22 NOTE — Telephone Encounter (Signed)
Received a message from [facility] in regards to the patient auto PAP and oxygen. "since this pt has Medicare, we can't provide O2 w/ an order for auto-PAP. CPAP needs to be ordered at optimal pressure first in order for [place] to use the titration as our testing. We can always have the pressure changed down the line, but to start it needs to be a set pressure. In this case, based on the titration, we would need the order for CPAP at 14cm w/ 2L O2 bled in."  I will change the order to reflect this in order to have the oxygen covered.

## 2019-11-22 NOTE — Addendum Note (Signed)
Addended by: Darleen Crocker on: 11/22/2019 10:26 AM   Modules accepted: Orders

## 2019-11-23 DIAGNOSIS — M79676 Pain in unspecified toe(s): Secondary | ICD-10-CM | POA: Diagnosis not present

## 2019-11-23 DIAGNOSIS — B351 Tinea unguium: Secondary | ICD-10-CM | POA: Diagnosis not present

## 2019-11-23 DIAGNOSIS — E1142 Type 2 diabetes mellitus with diabetic polyneuropathy: Secondary | ICD-10-CM | POA: Diagnosis not present

## 2019-11-23 DIAGNOSIS — L84 Corns and callosities: Secondary | ICD-10-CM | POA: Diagnosis not present

## 2019-11-24 NOTE — Telephone Encounter (Signed)
I received a call from the pt stating someone from [facility] came out to get her set up for her O2. Also, pt stated she was told she was unable to get supplies for her cpap machine. I told the pt I would check into this for her. Pt verbalized understanding and was very appreciative of this. I called [facility] and spoke to [place]. She stated that the person that delivered the O2 was a driver and they are unable to do anything with her equipment. Margreta Journey stated they would reach out to the pt to schedule a time for her to come in to their office. She stated she would have the pt bring in her Cpap machine and they would change her pressure on her cpap and give her supplies since she is a new pt.

## 2019-12-01 ENCOUNTER — Ambulatory Visit: Payer: Medicare Other | Admitting: Neurology

## 2019-12-01 ENCOUNTER — Other Ambulatory Visit (HOSPITAL_COMMUNITY): Payer: Self-pay | Admitting: Psychiatry

## 2019-12-01 ENCOUNTER — Other Ambulatory Visit: Payer: Self-pay | Admitting: Neurology

## 2019-12-01 ENCOUNTER — Telehealth (HOSPITAL_COMMUNITY): Payer: Self-pay | Admitting: *Deleted

## 2019-12-01 MED ORDER — VENLAFAXINE HCL ER 150 MG PO CP24: 300.0000 mg | ORAL_CAPSULE | Freq: Every morning | ORAL | 2 refills | Status: DC

## 2019-12-01 NOTE — Telephone Encounter (Signed)
Pharmacy called to get clarification for patient Effexor XR. Per pharmacy [facility] they received a script that stating QD but Quantity was different and patient stating she was told that she could take 2 tablets. Staff read pharmacy provider notes about medication to pharmacy and they need a new script with correct SIG and quantity sent to them

## 2019-12-01 NOTE — Telephone Encounter (Signed)
sent 

## 2019-12-01 NOTE — Telephone Encounter (Signed)
noted 

## 2019-12-02 ENCOUNTER — Ambulatory Visit (HOSPITAL_COMMUNITY): Payer: Medicare Other | Admitting: Psychiatry

## 2019-12-14 ENCOUNTER — Ambulatory Visit: Payer: Medicare Other | Admitting: Neurology

## 2019-12-14 ENCOUNTER — Encounter (INDEPENDENT_AMBULATORY_CARE_PROVIDER_SITE_OTHER): Payer: Self-pay

## 2019-12-16 ENCOUNTER — Encounter: Payer: Self-pay | Admitting: *Deleted

## 2019-12-18 ENCOUNTER — Other Ambulatory Visit: Payer: Self-pay | Admitting: Neurology

## 2019-12-21 ENCOUNTER — Telehealth (INDEPENDENT_AMBULATORY_CARE_PROVIDER_SITE_OTHER): Payer: Medicare Other | Admitting: Cardiology

## 2019-12-21 ENCOUNTER — Other Ambulatory Visit: Payer: Self-pay

## 2019-12-21 ENCOUNTER — Encounter: Payer: Self-pay | Admitting: Cardiology

## 2019-12-21 ENCOUNTER — Telehealth: Payer: Self-pay | Admitting: Cardiology

## 2019-12-21 VITALS — BP 125/57 | HR 75 | Ht 60.0 in | Wt 226.0 lb

## 2019-12-21 DIAGNOSIS — I5032 Chronic diastolic (congestive) heart failure: Secondary | ICD-10-CM

## 2019-12-21 DIAGNOSIS — I200 Unstable angina: Secondary | ICD-10-CM

## 2019-12-21 DIAGNOSIS — I2510 Atherosclerotic heart disease of native coronary artery without angina pectoris: Secondary | ICD-10-CM | POA: Diagnosis not present

## 2019-12-21 DIAGNOSIS — E782 Mixed hyperlipidemia: Secondary | ICD-10-CM

## 2019-12-21 NOTE — Addendum Note (Signed)
Addended by: Julian Hy T on: 12/21/2019 02:29 PM   Modules accepted: Orders

## 2019-12-21 NOTE — Telephone Encounter (Signed)
  Patient Consent for Virtual Visit         Norma [name] has provided verbal consent on 12/21/2019 for a virtual visit (video or telephone).   CONSENT FOR VIRTUAL VISIT FOR:  Norma [name]  By participating in this virtual visit I agree to the following:  I hereby voluntarily request, consent and authorize [facility] and its employed or contracted physicians, physician assistants, nurse practitioners or other licensed health care professionals (the Practitioner), to provide me with telemedicine health care services (the "Services") as deemed necessary by the treating Practitioner. I acknowledge and consent to receive the Services by the Practitioner via telemedicine. I understand that the telemedicine visit will involve communicating with the Practitioner through live audiovisual communication technology and the disclosure of certain medical information by electronic transmission. I acknowledge that I have been given the opportunity to request an in-person assessment or other available alternative prior to the telemedicine visit and am voluntarily participating in the telemedicine visit.  I understand that I have the right to withhold or withdraw my consent to the use of telemedicine in the course of my care at any time, without affecting my right to future care or treatment, and that the Practitioner or I may terminate the telemedicine visit at any time. I understand that I have the right to inspect all information obtained and/or recorded in the course of the telemedicine visit and may receive copies of available information for a reasonable fee.  I understand that some of the potential risks of receiving the Services via telemedicine include:  [name] Delay or interruption in medical evaluation due to technological equipment failure or disruption; . Information transmitted may not be sufficient (e.g. poor resolution of images) to allow for appropriate medical decision making by the Practitioner;  and/or  . In rare instances, security protocols could fail, causing a breach of personal health information.  Furthermore, I acknowledge that it is my responsibility to provide information about my medical history, conditions and care that is complete and accurate to the best of my ability. I acknowledge that Practitioner's advice, recommendations, and/or decision may be based on factors not within their control, such as incomplete or inaccurate data provided by me or distortions of diagnostic images or specimens that may result from electronic transmissions. I understand that the practice of medicine is not an exact science and that Practitioner makes no warranties or guarantees regarding treatment outcomes. I acknowledge that a copy of this consent can be made available to me via my patient portal ([facility]), or I can request a printed copy by calling the office of [facility].    I understand that my insurance will be billed for this visit.   I have read or had this consent read to me. . I understand the contents of this consent, which adequately explains the benefits and risks of the Services being provided via telemedicine.  . I have been provided ample opportunity to ask questions regarding this consent and the Services and have had my questions answered to my satisfaction. . I give my informed consent for the services to be provided through the use of telemedicine in my medical care

## 2019-12-21 NOTE — Patient Instructions (Addendum)
Your physician wants you to follow-up in: 6 MONTHS WITH DR BRANCH You will receive a reminder letter in the mail two months in advance. If you don't receive a letter, please call our office to schedule the follow-up appointment.  Your physician recommends that you continue on your current medications as directed. Please refer to the Current Medication list given to you today.  Your physician recommends that you return for lab work BMP/MG  Thank you for choosing [facility]!!    

## 2019-12-21 NOTE — Progress Notes (Signed)
Virtual Visit via Telephone Note   This visit type was conducted due to national recommendations for restrictions regarding the COVID-19 Pandemic (e.g. social distancing) in an effort to limit this patient's exposure and mitigate transmission in our community.  Due to her co-morbid illnesses, this patient is at least at moderate risk for complications without adequate follow up.  This format is felt to be most appropriate for this patient at this time.  The patient did not have access to video technology/had technical difficulties with video requiring transitioning to audio format only (telephone).  All issues noted in this document were discussed and addressed.  No physical exam could be performed with this format.  Please refer to the patient's chart for her  consent to telehealth for [facility].    Date:  12/21/2019   ID:  Norma [name], DOB 1950/05/19, MRN [id number] The patient was identified using 2 identifiers.  Patient Location: Home Provider Location: Office/Clinic  PCP:  Celene Squibb, MD  Cardiologist:  Carlyle Dolly, MD  Electrophysiologist:  None   Evaluation Performed:  Follow-Up Visit  Chief Complaint:  Follow up visit  History of Present Illness:    Norma [name] is a 69 y.o. female seen today for follow up of the following medical problems.  1.Chronic diastolic HF 40/9811 echo VLEF 91-47%, grade I diastolic dysfunction. - taking torsemide prn, mild uptrend in Cr from 1.2 to 1.4 by last labs - intermittent leg edema and SOB, responds well to prn torsemide.    2. CAD -05/2017 echo LVEF 40-45% - 08/2017 cath for unstable angina: LAD 95%, D1 90%, RPDA 80%, OM 90%.  - CT surgery was consulted. 08/30/17 CABG with sequential SVG to diag and LAD, SVG-OM. - stopped lopressor due to fatigue.  - no recent chest pain.   3. Chronic LBBB   4. Hyperlipidemia -intolerant to statins - followed in lipid clinic.  - she is on repatha, LDL 152-->43 on  repatha.    5. OSA - on cpap, followed by Dr Brett Fairy   The patient does not have symptoms concerning for COVID-19 infection (fever, chills, cough, or new shortness of breath).    Past Medical History:  Diagnosis Date  . Anemia   . Anxiety   . Bipolar disorder ([place])   . Bulging lumbar disc    L3-4  . Chronic daily headache   . Chronic low back pain 09/20/2014  . COPD (chronic obstructive pulmonary disease) ([place])   . Degenerative arthritis   . Depression   . Diabetes mellitus, type II ([place])   . Diabetic neuropathy ([place]) 09/16/2018  . DM type 2 with diabetic peripheral neuropathy ([place]) 09/20/2014  . Dyslipidemia   . Dyspnea   . Gastroparesis   . GERD (gastroesophageal reflux disease)   . Heart murmur   . History of hiatal hernia   . Hypothyroidism   . IBS (irritable bowel syndrome)   . Memory difficulty 09/20/2014  . Morbid obesity ([place])   . Neuropathy   . Obstructive sleep apnea on CPAP   . Restless legs syndrome (RLS) 09/17/2012  . Stroke (cerebrum) ([place]) 05/16/2017   Left parietal  . Urticaria    Past Surgical History:  Procedure Laterality Date  . ABDOMINAL HYSTERECTOMY    . ARTHROSCOPY KNEE W/ DRILLING  06/2011   and Decemer of 2013.  [name] Carpel tunnel  1980's  . CATARACT EXTRACTION W/PHACO Right 03/21/2017   Procedure: CATARACT EXTRACTION PHACO AND INTRAOCULAR LENS PLACEMENT RIGHT EYE;  Surgeon:  Baruch Goldmann, MD;  Location: [facility];  Service: Ophthalmology;  Laterality: Right;  CDE: 2.91   . CATARACT EXTRACTION W/PHACO Left 04/18/2017   Procedure: CATARACT EXTRACTION PHACO AND INTRAOCULAR LENS PLACEMENT LEFT EYE;  Surgeon: Baruch Goldmann, MD;  Location: [facility];  Service: Ophthalmology;  Laterality: Left;  left  . CHOLECYSTECTOMY    . COLONOSCOPY WITH PROPOFOL N/A 10/04/2016   Procedure: COLONOSCOPY WITH PROPOFOL;  Surgeon: Rogene Houston, MD;  Location: [facility];  Service: Endoscopy;  Laterality: N/A;  11:10  . CORONARY ARTERY BYPASS GRAFT N/A 08/29/2017    Procedure: CORONARY ARTERY BYPASS GRAFTING (CABG) x 3 WITH ENDOSCOPIC HARVESTING OF RIGHT SAPHENOUS VEIN;  Surgeon: Ivin Poot, MD;  Location: [facility];  Service: Open Heart Surgery;  Laterality: N/A;  . ESOPHAGOGASTRODUODENOSCOPY N/A 04/25/2016   Procedure: ESOPHAGOGASTRODUODENOSCOPY (EGD);  Surgeon: Rogene Houston, MD;  [facility];  Service: Endoscopy;  Laterality: N/A;  730  . LEFT HEART CATH AND CORONARY ANGIOGRAPHY N/A 08/27/2017   Procedure: LEFT HEART CATH AND CORONARY ANGIOGRAPHY;  Surgeon: Sherren Mocha, MD;  Location: [facility];  Service: Cardiovascular::  pLAD 95% - p-mLAD 50%, ostD1 90% -pD1 80%; ostOM1 90%; rPDA 80%. EF ~50-55% - HK of dital Anterolateral & Apical wall.  - Rec CVTS c/s  . RECTAL SURGERY     fissure  . SHOULDER SURGERY Left    arthroscopy in March of this year  . TEE WITHOUT CARDIOVERSION N/A 08/29/2017   Procedure: TRANSESOPHAGEAL ECHOCARDIOGRAM (TEE);  Surgeon: Prescott Gum, Collier Salina, MD;  Location: [facility];  Service: Open Heart Surgery;  Laterality: N/A;  . TRANSTHORACIC ECHOCARDIOGRAM  06/06/2017   Mild to moderate reduced EF 40 and 45%.  Anterior septal, inferoseptal and basal to mid inferior hypokinesis.  GR 1 DD.  No significant valvular lesion     Current Meds  Medication Sig  . ALPRAZolam (XANAX) 0.25 MG tablet Take 1 tablet (0.25 mg total) by mouth 2 (two) times daily as needed for anxiety.  [name] aspirin EC 81 MG tablet Take 1 tablet (81 mg total) by mouth daily.  . baclofen (LIORESAL) 10 MG tablet TAKE ONE TABLET BY MOUTH AT BEDTIME.  [name] buPROPion (WELLBUTRIN XL) 300 MG 24 hr tablet Take 1 tablet (300 mg total) by mouth every morning.  . calcium carbonate (TUMS - DOSED IN MG ELEMENTAL CALCIUM) 500 MG chewable tablet Chew 2 tablets by mouth as needed for indigestion or heartburn.   . carvedilol (COREG) 6.25 MG tablet TAKE ONE TABLET BY MOUTH TWICE DAILY (MORNING ,EVENING)  . cetirizine (ZYRTEC) 10 MG tablet Take 1 tablet tablet 1-2 times  daily. (Patient taking differently: Take 10 mg by mouth daily. Take 1 tablet tablet 1-2 times daily.)  . diclofenac Sodium (VOLTAREN) 1 % GEL Apply topically 4 (four) times daily.  . diphenhydrAMINE (BENADRYL) 25 mg capsule Take 25 mg by mouth as needed.  [name] EPINEPHrine (EPIPEN 2-PAK) 0.3 mg/0.3 mL IJ SOAJ injection Inject 0.3 mg into the muscle once.  [name] esomeprazole (NEXIUM) 40 MG capsule Take 40 mg by mouth daily before supper.   . Evolocumab (REPATHA SURECLICK) 086 MG/ML SOAJ Inject 1 Dose into the skin every 14 (fourteen) days.  . famotidine (PEPCID) 20 MG tablet Take 1 tablet (20 mg total) by mouth daily. (Patient taking differently: Take 20 mg by mouth at bedtime. )  . gabapentin (NEURONTIN) 300 MG capsule Take 300 mg by mouth 4 (four) times daily.  [name] glipiZIDE (GLUCOTROL XL) 5 MG  24 hr tablet TAKE ONE TABLET BY MOUTH DAILY WITH BREAKFAST  . lamoTRIgine (LAMICTAL) 150 MG tablet Take 1 tablet (150 mg total) by mouth at bedtime.  [name] levothyroxine (SYNTHROID, LEVOTHROID) 100 MCG tablet Take 100 mcg by mouth daily before breakfast.   . losartan (COZAAR) 25 MG tablet TAKE ONE TABLET BY MOUTH DAILY (MORNING)  . meclizine (ANTIVERT) 25 MG tablet Take 1 tablet (25 mg total) by mouth 3 (three) times daily as needed for dizziness. (Patient taking differently: Take 25 mg by mouth daily as needed for dizziness. )  . metFORMIN (GLUCOPHAGE) 500 MG tablet TAKE ONE TABLET BY MOUTH TWICE DAILY WITH A MEAL (MORNING ,EVENING)  . montelukast (SINGULAIR) 10 MG tablet   . Pediatric Multiple Vitamins (FLINTSTONES MULTIVITAMIN PO) Take by mouth.   [name] rOPINIRole (REQUIP) 3 MG tablet TAKE ONE TABLET BY MOUTH EVERY MORNING, ,NOON AND AT BEDTIME. (PRT PT MORNING,EVENING,BEDTIME)  . torsemide (DEMADEX) 20 MG tablet TAKE ONE TABLET BY MOUTH ALTERNATING WITH TWO TABLETS daily (PER PT ONE DAILY-EXTRA IN BOTTLE)  . traMADol (ULTRAM) 50 MG tablet Take 100 mg by mouth 4 (four) times daily as needed.   . traZODone (DESYREL) 150  MG tablet Take 1 tablet (150 mg total) by mouth at bedtime.  [name] venlafaxine XR (EFFEXOR-XR) 150 MG 24 hr capsule Take 2 capsules (300 mg total) by mouth in the morning.  . [DISCONTINUED] gabapentin (NEURONTIN) 400 MG capsule Take 1 capsule (400 mg total) by mouth 3 (three) times daily.     Allergies:   Amoxicillin, Hydrocodone, Percocet [oxycodone-acetaminophen], Vancomycin, Depacon [valproic acid], and Iron   Social History   Tobacco Use  . Smoking status: Former Smoker    Types: Cigarettes    Quit date: 02/04/1971    Years since quitting: 48.9  . Smokeless tobacco: Never Used  . Tobacco comment: smoked 2 cigarettes a day  Vaping Use  . Vaping Use: Never used  Substance Use Topics  . Alcohol use: No    Alcohol/week: 0.0 standard drinks  . Drug use: No     Family Hx: The patient's family history includes Alcohol abuse in her brother, father, paternal grandfather, paternal grandmother, and paternal uncle; Arthritis in her father; Cancer in her brother; Depression in her mother; Hypertension in her mother and sister; Lymphoma in her mother. There is no history of Allergic rhinitis, Angioedema, Asthma, Atopy, Eczema, Immunodeficiency, or Urticaria.  ROS:   Please see the history of present illness.     All other systems reviewed and are negative.   Prior CV studies:   The following studies were reviewed today:  03/2016 Lexiscan  Probable normal perfusion and mild soft tissue attenuation (breast) No significant ischemia or evidence for scar  The left ventricular ejection fraction is normal (55-65%).  Low risk scan.  08/2017 cath  RPDA lesion is 80% stenosed.  Ost 1st Mrg to 1st Mrg lesion is 90% stenosed.  Prox LAD lesion is 95% stenosed.  Prox LAD to Mid LAD lesion is 50% stenosed.  Ost 1st Diag lesion is 90% stenosed.  1st Diag lesion is 80% stenosed.  The left ventricular ejection fraction is 50-55% by visual estimate.  There is mild left ventricular systolic  dysfunction.  1. Severe multivessel coronary artery disease with severe stenosis of the proximal LAD/first diagonal bifurcation, first obtuse marginal Norma [name] of the circumflex, and PDA Norma [name] of the RCA. 2. Mild segmental LV systolic dysfunction with hypokinesis of the distal anterolateral and apical walls, LVEF estimated at 50 to  55%.  08/2017 TEE  Aortic valve: The valve is trileaflet. No stenosis. No regurgitation.  Mitral valve: Mild regurgitation. Central jet.  Right ventricle: Normal cavity size, wall thickness and ejection fraction.  Pericardium: Trivial pericardial effusion.  Tricuspid valve: No regurgitation  Pulmonic valve: No regurgitation by color doppler.  Left ventricle: Regional wall motion abnormalities present, dyskinetic basal and apical septal wall, hypokinesis of lateral wall and inferoseptal wall. Increased wall thickness. LVEF 35-40%.  No ASD/PFO present  Unable to rule out thrombus in LAA but velocities make thrombus less likely.  05/2017 echo Study Conclusions  - Left ventricle: The cavity size was normal. There was mild concentric hypertrophy. Systolic function was mildly to moderately reduced. The estimated ejection fraction was in the range of 40% to 45%. Hypokinesis of the anteroseptal, inferoseptal and basal to mid inferior myocardium. Doppler parameters are consistent with abnormal left ventricular relaxation (grade 1 diastolic dysfunction). Doppler parameters are consistent with indeterminate ventricular filling pressure. - Aortic valve: Transvalvular velocity was within the normal range. There was no stenosis. There was no regurgitation. - Mitral valve: Transvalvular velocity was within the normal range. There was no evidence for stenosis. There was no regurgitation. - Right ventricle: The cavity size was normal. Wall thickness was normal. Systolic function was normal. - Tricuspid valve: There was trivial  regurgitation. - Global longitudinal strain -11.9%.   01/2018 echo Study Conclusions  - Left ventricle: The cavity size was normal. Wall thickness was increased in a pattern of mild LVH. Systolic function was normal. The estimated ejection fraction was in the range of 50% to 55%. Regional wall motion abnormalities cannot be excluded. Doppler parameters are consistent with abnormal left ventricular relaxation (grade 1 diastolic dysfunction). - Ventricular septum: Septal motion showed abnormal function and dyssynergy. These changes are consistent with a left bundle Norma [name] block. - Mitral valve: Mildly thickened leaflets .  Labs/Other Tests and Data Reviewed:    EKG:  n/a  Recent Labs: 03/05/2019: Magnesium 1.9; TSH 1.49 04/23/2019: ALT 14; Potassium 4.4; Sodium 140 06/15/2019: Hemoglobin 11.6; Platelets 460 11/08/2019: BUN 21; Creatinine 1.4   Recent Lipid Panel Lab Results  Component Value Date/Time   CHOL 129 11/08/2019 12:00 AM   TRIG 158 11/08/2019 12:00 AM   HDL 60 11/08/2019 12:00 AM   CHOLHDL 3.8 03/05/2019 12:21 PM   LDLCALC 43 11/08/2019 12:00 AM   LDLCALC 152 (H) 03/05/2019 12:21 PM    Wt Readings from Last 3 Encounters:  12/21/19 226 lb (102.5 kg)  08/16/19 243 lb (110.2 kg)  07/08/19 241 lb 9.6 oz (109.6 kg)     Objective:    Vital Signs:  BP (!) 125/57   Pulse 75   Ht 5' (1.524 m)   Wt 226 lb (102.5 kg)   BMI 44.14 kg/m    Normal affect. Normal speech pattern and tone. Comfortable, no apparent distress. No audible signs of sob or wheezing.   ASSESSMENT & PLAN:    1.Chronic diastolic HF - controlled with prn toresmide, repeat labs wth BMET/Mg  2.CAD - beta blocker stopped due to fatigue. - no chest pains, continue current meds  3. Hyperlipidemia -has not tolerated statins - doing very well on repatha, continue to follow in lipid clinic.    COVID-19 Education: The signs and symptoms of COVID-19 were discussed with the  patient and how to seek care for testing (follow up with PCP or arrange E-visit).  The importance of social distancing was discussed today.  Time:   Today, I have  spent 16 minutes with the patient with telehealth technology discussing the above problems.     Medication Adjustments/Labs and Tests Ordered: Current medicines are reviewed at length with the patient today.  Concerns regarding medicines are outlined above.   Tests Ordered: No orders of the defined types were placed in this encounter.   Medication Changes: No orders of the defined types were placed in this encounter.   Follow Up:  In Person in 6 month(s)  Signed, Carlyle Dolly, MD  12/21/2019 2:22 PM

## 2019-12-22 NOTE — Telephone Encounter (Signed)
Pt should be called to clarify dosage. She last reported she was taking 400 mg three times daily and this was ok per Dr Jannifer Franklin.

## 2019-12-23 ENCOUNTER — Telehealth (HOSPITAL_COMMUNITY): Payer: Medicare Other | Admitting: Psychiatry

## 2019-12-27 ENCOUNTER — Other Ambulatory Visit: Payer: Self-pay | Admitting: Neurology

## 2019-12-27 MED ORDER — GABAPENTIN 400 MG PO CAPS: 400.0000 mg | ORAL_CAPSULE | Freq: Three times a day (TID) | ORAL | 1 refills | Status: DC

## 2019-12-30 ENCOUNTER — Ambulatory Visit: Payer: Medicare Other | Admitting: Internal Medicine

## 2019-12-31 ENCOUNTER — Telehealth (INDEPENDENT_AMBULATORY_CARE_PROVIDER_SITE_OTHER): Payer: Medicare Other | Admitting: Internal Medicine

## 2019-12-31 ENCOUNTER — Encounter: Payer: Self-pay | Admitting: Internal Medicine

## 2019-12-31 ENCOUNTER — Telehealth: Payer: Medicare Other | Admitting: Internal Medicine

## 2019-12-31 VITALS — Ht 60.0 in | Wt 226.0 lb

## 2019-12-31 DIAGNOSIS — I2510 Atherosclerotic heart disease of native coronary artery without angina pectoris: Secondary | ICD-10-CM

## 2019-12-31 DIAGNOSIS — E782 Mixed hyperlipidemia: Secondary | ICD-10-CM

## 2019-12-31 DIAGNOSIS — T466X5A Adverse effect of antihyperlipidemic and antiarteriosclerotic drugs, initial encounter: Secondary | ICD-10-CM

## 2019-12-31 DIAGNOSIS — I200 Unstable angina: Secondary | ICD-10-CM | POA: Diagnosis not present

## 2019-12-31 DIAGNOSIS — M791 Myalgia, unspecified site: Secondary | ICD-10-CM | POA: Diagnosis not present

## 2019-12-31 NOTE — Progress Notes (Signed)
Virtual Visit via Telephone Note   This visit type was conducted due to national recommendations for restrictions regarding the COVID-19 Pandemic (e.g. social distancing) in an effort to limit this patient's exposure and mitigate transmission in our community.  Due to her co-morbid illnesses, this patient is at least at moderate risk for complications without adequate follow up.  This format is felt to be most appropriate for this patient at this time.  The patient did not have access to video technology/had technical difficulties with video requiring transitioning to audio format only (telephone).  All issues noted in this document were discussed and addressed.  No physical exam could be performed with this format.  Please refer to the patient's chart for her  consent to telehealth for [facility].   Date:  12/31/2019   ID:  [name], DOB 1950-06-04, MRN [id number] The patient was identified using 2 identifiers.  Evaluation Performed:  Follow-Up Visit  Patient Location:  [place] [address]  Provider location:   [address] [place], [place] [address]  PCP:  Celene Squibb, MD  Cardiologist:  Carlyle Dolly, MD Electrophysiologist:  None   Chief Complaint:  Telephone follow-up  History of Present Illness:    Norma [name] is a 69 y.o. female who presents via audio/video conferencing for a telehealth visit today.   This is a pleasant 69 year old female kindly referred by Dr. Harl Bowie for evaluation and management of dyslipidemia.  Past medical history significant for coronary disease with coronary artery bypass grafting in May 2019.  Unfortunately, she is struggled with statin intolerance, having tried atorvastatin, rosuvastatin, simvastatin and pravastatin, all worsening her myalgias.  She also struggles with degenerative arthritis, type 2 diabetes, neuropathy and diabetes and other multiple medical problems.  More recently her lipids were  assessed showing total cholesterol 248, triglycerides 175, HDL 65 and LDL 152.  We discussed several treatment options for her today however presented PCSK9 inhibitor which may be the best tolerated option to reach her target LDL less than 70.   12/31/2019  Ms. Blackwelder returns today for follow-up.  Overall she seems to be doing well and is tolerating Repatha.  She has had a marked reduction in her lipids.  Most recently her lipid profile in July showed total cholesterol 129, triglycerides 158, HDL 60 and LDL 43 which is at target.  She is denying any side effects such as the myalgias or worsening joint aches that she had with the statins.  Overall she is pleased with the medicine.  Unfortunately she does have the health well grant however that grant is set to expire and we were recently informed that the foundation is out of funds.  The patient does not have symptoms concerning for COVID-19 infection (fever, chills, cough, or new SHORTNESS OF BREATH).    Prior CV studies:   The following studies were reviewed today:  Chart reviewed, lab work  PMHx:  Past Medical History:  Diagnosis Date  . Anemia   . Anxiety   . Bipolar disorder ([place])   . Bulging lumbar disc    L3-4  . Chronic daily headache   . Chronic low back pain 09/20/2014  . COPD (chronic obstructive pulmonary disease) ([place])   . Degenerative arthritis   . Depression   . Diabetes mellitus, type II ([place])   . Diabetic neuropathy ([place]) 09/16/2018  . DM type 2 with diabetic peripheral neuropathy ([place]) 09/20/2014  . Dyslipidemia   . Dyspnea   .  Gastroparesis   . GERD (gastroesophageal reflux disease)   . Heart murmur   . History of hiatal hernia   . Hypothyroidism   . IBS (irritable bowel syndrome)   . Memory difficulty 09/20/2014  . Morbid obesity ([place])   . Neuropathy   . Obstructive sleep apnea on CPAP   . Restless legs syndrome (RLS) 09/17/2012  . Stroke (cerebrum) ([place]) 05/16/2017   Left parietal  . Urticaria     Past Surgical  History:  Procedure Laterality Date  . ABDOMINAL HYSTERECTOMY    . ARTHROSCOPY KNEE W/ DRILLING  06/2011   and Decemer of 2013.  [name] Carpel tunnel  1980's  . CATARACT EXTRACTION W/PHACO Right 03/21/2017   Procedure: CATARACT EXTRACTION PHACO AND INTRAOCULAR LENS PLACEMENT RIGHT EYE;  Surgeon: Baruch Goldmann, MD;  Location: [facility];  Service: Ophthalmology;  Laterality: Right;  CDE: 2.91   . CATARACT EXTRACTION W/PHACO Left 04/18/2017   Procedure: CATARACT EXTRACTION PHACO AND INTRAOCULAR LENS PLACEMENT LEFT EYE;  Surgeon: Baruch Goldmann, MD;  Location: [facility];  Service: Ophthalmology;  Laterality: Left;  left  . CHOLECYSTECTOMY    . COLONOSCOPY WITH PROPOFOL N/A 10/04/2016   Procedure: COLONOSCOPY WITH PROPOFOL;  Surgeon: Rogene Houston, MD;  Location[facility];  Service: Endoscopy;  Laterality: N/A;  11:10  . CORONARY ARTERY BYPASS GRAFT N/A 08/29/2017   Procedure: CORONARY ARTERY BYPASS GRAFTING (CABG) x 3 WITH ENDOSCOPIC HARVESTING OF RIGHT SAPHENOUS VEIN;  Surgeon: Ivin Poot, MD;  Location: [facility];  Service: Open Heart Surgery;  Laterality: N/A;  . ESOPHAGOGASTRODUODENOSCOPY N/A 04/25/2016   Procedure: ESOPHAGOGASTRODUODENOSCOPY (EGD);  Surgeon: Rogene Houston, MD;  [facility];  Service: Endoscopy;  Laterality: N/A;  730  . LEFT HEART CATH AND CORONARY ANGIOGRAPHY N/A 08/27/2017   Procedure: LEFT HEART CATH AND CORONARY ANGIOGRAPHY;  Surgeon: Sherren Mocha, MD;  Location: [facility];  Service: Cardiovascular::  pLAD 95% - p-mLAD 50%, ostD1 90% -pD1 80%; ostOM1 90%; rPDA 80%. EF ~50-55% - HK of dital Anterolateral & Apical wall.  - Rec CVTS c/s  . RECTAL SURGERY     fissure  . SHOULDER SURGERY Left    arthroscopy in March of this year  . TEE WITHOUT CARDIOVERSION N/A 08/29/2017   Procedure: TRANSESOPHAGEAL ECHOCARDIOGRAM (TEE);  Surgeon: Prescott Gum, Collier Salina, MD;  Location: [facility];  Service: Open Heart Surgery;  Laterality: N/A;  . TRANSTHORACIC ECHOCARDIOGRAM   06/06/2017   Mild to moderate reduced EF 40 and 45%.  Anterior septal, inferoseptal and basal to mid inferior hypokinesis.  GR 1 DD.  No significant valvular lesion    FAMHx:  Family History  Problem Relation Age of Onset  . Hypertension Mother   . Lymphoma Mother   . Depression Mother   . Arthritis Father   . Alcohol abuse Father   . Hypertension Sister   . Cancer Brother        kidney and lung  . Alcohol abuse Brother   . Alcohol abuse Paternal Uncle   . Alcohol abuse Paternal Grandfather   . Alcohol abuse Paternal Grandmother   . Allergic rhinitis Neg Hx   . Angioedema Neg Hx   . Asthma Neg Hx   . Atopy Neg Hx   . Eczema Neg Hx   . Immunodeficiency Neg Hx   . Urticaria Neg Hx     SOCHx:   reports that she quit smoking about 48 years ago. Her smoking use included cigarettes. She has never used  smokeless tobacco. She reports that she does not drink alcohol and does not use drugs.  ALLERGIES:  Allergies  Allergen Reactions  . Amoxicillin Anaphylaxis and Other (See Comments)    Has patient had a PCN reaction causing immediate rash, facial/tongue/throat swelling, SOB or lightheadedness with hypotension: Yes Has patient had a PCN reaction causing severe rash involving mucus membranes or skin necrosis: Yes Has patient had a PCN reaction that required hospitalization: Yes Has patient had a PCN reaction occurring within the last 10 years: Yes If all of the above answers are "NO", then may proceed with Cephalosporin use.   [name] Hydrocodone Anaphylaxis  . Percocet [Oxycodone-Acetaminophen] Other (See Comments)    Causes chest pain /tightness. Pressure pain around her ribs.  . Vancomycin Itching  . Depacon [Valproic Acid] Other (See Comments)    Causes falls   . Iron Nausea And Vomiting    MEDS:  Current Meds  Medication Sig  . ALPRAZolam (XANAX) 0.25 MG tablet Take 1 tablet (0.25 mg total) by mouth 2 (two) times daily as needed for anxiety.  [name] aspirin EC 81 MG tablet Take  1 tablet (81 mg total) by mouth daily.  . baclofen (LIORESAL) 10 MG tablet TAKE ONE TABLET BY MOUTH AT BEDTIME.  [name] buPROPion (WELLBUTRIN XL) 300 MG 24 hr tablet Take 1 tablet (300 mg total) by mouth every morning.  . calcium carbonate (TUMS - DOSED IN MG ELEMENTAL CALCIUM) 500 MG chewable tablet Chew 2 tablets by mouth as needed for indigestion or heartburn.   . carvedilol (COREG) 6.25 MG tablet TAKE ONE TABLET BY MOUTH TWICE DAILY (MORNING ,EVENING)  . cetirizine (ZYRTEC) 10 MG tablet Take 1 tablet tablet 1-2 times daily. (Patient taking differently: Take 10 mg by mouth daily. Take 1 tablet tablet 1-2 times daily.)  . diclofenac Sodium (VOLTAREN) 1 % GEL Apply topically 4 (four) times daily.  . diphenhydrAMINE (BENADRYL) 25 mg capsule Take 25 mg by mouth as needed.  [name] EPINEPHrine (EPIPEN 2-PAK) 0.3 mg/0.3 mL IJ SOAJ injection Inject 0.3 mg into the muscle once.  [name] esomeprazole (NEXIUM) 40 MG capsule Take 40 mg by mouth daily before supper.   . Evolocumab (REPATHA SURECLICK) 643 MG/ML SOAJ Inject 1 Dose into the skin every 14 (fourteen) days.  . famotidine (PEPCID) 20 MG tablet Take 1 tablet (20 mg total) by mouth daily. (Patient taking differently: Take 20 mg by mouth at bedtime. )  . gabapentin (NEURONTIN) 400 MG capsule Take 1 capsule (400 mg total) by mouth 3 (three) times daily.  [name] glipiZIDE (GLUCOTROL XL) 5 MG 24 hr tablet TAKE ONE TABLET BY MOUTH DAILY WITH BREAKFAST  . lamoTRIgine (LAMICTAL) 150 MG tablet Take 1 tablet (150 mg total) by mouth at bedtime.  [name] levothyroxine (SYNTHROID, LEVOTHROID) 100 MCG tablet Take 100 mcg by mouth daily before breakfast.   . losartan (COZAAR) 25 MG tablet TAKE ONE TABLET BY MOUTH DAILY (MORNING)  . meclizine (ANTIVERT) 25 MG tablet Take 1 tablet (25 mg total) by mouth 3 (three) times daily as needed for dizziness. (Patient taking differently: Take 25 mg by mouth daily as needed for dizziness. )  . metFORMIN (GLUCOPHAGE) 500 MG tablet TAKE ONE TABLET BY  MOUTH TWICE DAILY WITH A MEAL (MORNING ,EVENING)  . montelukast (SINGULAIR) 10 MG tablet Take 10 mg by mouth daily as needed.   . ondansetron (ZOFRAN) 4 MG tablet Take 1 tablet (4 mg total) by mouth 2 (two) times daily as needed for nausea or vomiting.  [name]  rOPINIRole (REQUIP) 3 MG tablet TAKE ONE TABLET BY MOUTH EVERY MORNING, ,NOON AND AT BEDTIME. (PRT PT MORNING,EVENING,BEDTIME)  . torsemide (DEMADEX) 20 MG tablet TAKE ONE TABLET BY MOUTH ALTERNATING WITH TWO TABLETS daily (PER PT ONE DAILY-EXTRA IN BOTTLE)  . traMADol (ULTRAM) 50 MG tablet Take 100 mg by mouth 4 (four) times daily as needed.   . traZODone (DESYREL) 150 MG tablet Take 1 tablet (150 mg total) by mouth at bedtime.  [name] venlafaxine XR (EFFEXOR-XR) 150 MG 24 hr capsule Take 2 capsules (300 mg total) by mouth in the morning.  . [DISCONTINUED] Pediatric Multiple Vitamins (FLINTSTONES MULTIVITAMIN PO) Take by mouth.      ROS: Pertinent items noted in HPI and remainder of comprehensive ROS otherwise negative.  Labs/Other Tests and Data Reviewed:    Recent Labs: 03/05/2019: Magnesium 1.9; TSH 1.49 04/23/2019: ALT 14; Potassium 4.4; Sodium 140 06/15/2019: Hemoglobin 11.6; Platelets 460 11/08/2019: BUN 21; Creatinine 1.4   Recent Lipid Panel Lab Results  Component Value Date/Time   CHOL 129 11/08/2019 12:00 AM   TRIG 158 11/08/2019 12:00 AM   HDL 60 11/08/2019 12:00 AM   CHOLHDL 3.8 03/05/2019 12:21 PM   LDLCALC 43 11/08/2019 12:00 AM   LDLCALC 152 (H) 03/05/2019 12:21 PM    Wt Readings from Last 3 Encounters:  12/31/19 226 lb (102.5 kg)  12/21/19 226 lb (102.5 kg)  08/16/19 243 lb (110.2 kg)     Exam:    Vital Signs:  Ht 5' (1.524 m)   Wt 226 lb (102.5 kg)   BMI 44.14 kg/m    Exam not performed due to telephone visit  ASSESSMENT & PLAN:    1. Mixed dyslipidemia, goal LDL <70 2. CAD status post CABG x 3 (08/2017) 3. Statin myalgias  Ms. Loveridge is doing well with a significant reduction in her cholesterol now at  target on Repatha.  She has a health well grant however that has run out of funds so her renewal cost may go up.  We may need to target the drug company to see if they will offer any grants or assistance.  Hopefully other options will become available.  Plan follow-up with [place] annually or sooner as necessary.  COVID-19 Education: The signs and symptoms of COVID-19 were discussed with the patient and how to seek care for testing (follow up with PCP or arrange E-visit).  The importance of social distancing was discussed today.  Patient Risk:   After full review of this patients clinical status, I feel that they are at least moderate risk at this time.  Time:   Today, I have spent 15 minutes with the patient with telehealth technology discussing dyslipidemia, coronary disease, funding options.     Medication Adjustments/Labs and Tests Ordered: Current medicines are reviewed at length with the patient today.  Concerns regarding medicines are outlined above.   Tests Ordered: No orders of the defined types were placed in this encounter.   Medication Changes: No orders of the defined types were placed in this encounter.   Disposition:  in 1 year(s)  Pixie Casino, MD, [facility], [place] Director of the Advanced Lipid Disorders &  Cardiovascular Risk Reduction Clinic Diplomate of the American Board of Clinical Lipidology Attending Cardiologist  Direct Dial: [phone]  Fax: [phone]  Website:  [email or web address]  Pixie Casino, MD  12/31/2019 10:07 AM

## 2019-12-31 NOTE — Patient Instructions (Signed)
Medication Instructions:  Your physician recommends that you continue on your current medications as directed. Please refer to the Current Medication list given to you today.  *If you need a refill on your cardiac medications before your next appointment, please call your pharmacy*  Follow-Up: At [facility], you and your health needs are our priority.  As part of our continuing mission to provide you with exceptional heart care, we have created designated Provider Care Teams.  These Care Teams include your primary Cardiologist (physician) and Advanced Practice Providers (APPs -  Physician Assistants and Nurse Practitioners) who all work together to provide you with the care you need, when you need it.  We recommend signing up for the patient portal called "MyChart".  Sign up information is provided on this After Visit Summary.  MyChart is used to connect with patients for Virtual Visits (Telemedicine).  Patients are able to view lab/test results, encounter notes, upcoming appointments, etc.  Non-urgent messages can be sent to your provider as well.   To learn more about what you can do with MyChart, go to [email or web address].    Your next appointment:   12 month(s)  The format for your next appointment:   Virtual Visit   Provider:   K. Mali Hilty, MD (Lipid clinic)

## 2020-01-03 ENCOUNTER — Other Ambulatory Visit: Payer: Self-pay | Admitting: Neurology

## 2020-01-06 DIAGNOSIS — M545 Low back pain: Secondary | ICD-10-CM | POA: Diagnosis not present

## 2020-01-06 DIAGNOSIS — R296 Repeated falls: Secondary | ICD-10-CM | POA: Diagnosis not present

## 2020-01-07 ENCOUNTER — Telehealth: Payer: Self-pay | Admitting: Internal Medicine

## 2020-01-07 NOTE — Telephone Encounter (Signed)
New message:     Patient states that the nurse wanted her to call and give [place] her member ID number [id number]

## 2020-01-07 NOTE — Telephone Encounter (Signed)
PA submitted via [employer] already on file for this request. Authorization starting on 01/06/2020 and ending on 01/05/2021

## 2020-01-11 DIAGNOSIS — R0683 Snoring: Secondary | ICD-10-CM | POA: Diagnosis not present

## 2020-01-11 DIAGNOSIS — G4730 Sleep apnea, unspecified: Secondary | ICD-10-CM | POA: Diagnosis not present

## 2020-01-13 ENCOUNTER — Other Ambulatory Visit: Payer: Self-pay

## 2020-01-13 ENCOUNTER — Telehealth (HOSPITAL_COMMUNITY): Payer: Medicare Other | Admitting: Psychiatry

## 2020-01-17 ENCOUNTER — Telehealth (HOSPITAL_COMMUNITY): Payer: Medicare Other | Admitting: Psychiatry

## 2020-01-17 ENCOUNTER — Telehealth: Payer: Self-pay | Admitting: Neurology

## 2020-01-17 ENCOUNTER — Other Ambulatory Visit: Payer: Self-pay

## 2020-01-17 NOTE — Telephone Encounter (Signed)
Called and spoke with patient. She has been rescheduled for 04/17/20 at 315pm for [place]. Will fax CPAP order to [facility].

## 2020-01-17 NOTE — Progress Notes (Deleted)
PATIENT: Norma [name] DOB: 02/06/1951  REASON FOR VISIT: follow up HISTORY FROM: patient  HISTORY OF PRESENT ILLNESS: Today 01/17/20  Norma [name] is a 69 year old female who follows with Dr. Jannifer Franklin multiple issues (peripheral neuropathy, gait disorder, right-sided sciatica, memory disorder, cerebrovascular disease, RLS).  Saw Dr. Brett Fairy for sleep consultation, recommended auto CPAP.  Nerve conduction evaluation in March 2021 showed evidence of diabetic peripheral neuropathy, she is on gabapentin, and Requip.  HISTORY   REVIEW OF SYSTEMS: Out of a complete 14 system review of symptoms, the patient complains only of the following symptoms, and all other reviewed systems are negative.  ALLERGIES: Allergies  Allergen Reactions  . Amoxicillin Anaphylaxis and Other (See Comments)    Has patient had a PCN reaction causing immediate rash, facial/tongue/throat swelling, SOB or lightheadedness with hypotension: Yes Has patient had a PCN reaction causing severe rash involving mucus membranes or skin necrosis: Yes Has patient had a PCN reaction that required hospitalization: Yes Has patient had a PCN reaction occurring within the last 10 years: Yes If all of the above answers are "NO", then may proceed with Cephalosporin use.   [name] Hydrocodone Anaphylaxis  . Percocet [Oxycodone-Acetaminophen] Other (See Comments)    Causes chest pain /tightness. Pressure pain around her ribs.  . Vancomycin Itching  . Depacon [Valproic Acid] Other (See Comments)    Causes falls   . Iron Nausea And Vomiting    HOME MEDICATIONS: Outpatient Medications Prior to Visit  Medication Sig Dispense Refill  . ALPRAZolam (XANAX) 0.25 MG tablet Take 1 tablet (0.25 mg total) by mouth 2 (two) times daily as needed for anxiety. 60 tablet 2  . aspirin EC 81 MG tablet Take 1 tablet (81 mg total) by mouth daily. 90 tablet 3  . baclofen (LIORESAL) 10 MG tablet TAKE ONE TABLET BY MOUTH AT BEDTIME. 30 tablet 6  .  buPROPion (WELLBUTRIN XL) 300 MG 24 hr tablet Take 1 tablet (300 mg total) by mouth every morning. 30 tablet 2  . calcium carbonate (TUMS - DOSED IN MG ELEMENTAL CALCIUM) 500 MG chewable tablet Chew 2 tablets by mouth as needed for indigestion or heartburn.     . carvedilol (COREG) 6.25 MG tablet TAKE ONE TABLET BY MOUTH TWICE DAILY (MORNING ,EVENING) 180 tablet 2  . cetirizine (ZYRTEC) 10 MG tablet Take 1 tablet tablet 1-2 times daily. (Patient taking differently: Take 10 mg by mouth daily. Take 1 tablet tablet 1-2 times daily.) 60 tablet 5  . diclofenac Sodium (VOLTAREN) 1 % GEL Apply topically 4 (four) times daily.    . diphenhydrAMINE (BENADRYL) 25 mg capsule Take 25 mg by mouth as needed.    [name] EPINEPHrine (EPIPEN 2-PAK) 0.3 mg/0.3 mL IJ SOAJ injection Inject 0.3 mg into the muscle once.    [name] esomeprazole (NEXIUM) 40 MG capsule Take 40 mg by mouth daily before supper.   12  . Evolocumab (REPATHA SURECLICK) 941 MG/ML SOAJ Inject 1 Dose into the skin every 14 (fourteen) days. 2 pen 11  . famotidine (PEPCID) 20 MG tablet Take 1 tablet (20 mg total) by mouth daily. (Patient taking differently: Take 20 mg by mouth at bedtime. ) 30 tablet 5  . gabapentin (NEURONTIN) 400 MG capsule Take 1 capsule (400 mg total) by mouth 3 (three) times daily. 270 capsule 1  . glipiZIDE (GLUCOTROL XL) 5 MG 24 hr tablet TAKE ONE TABLET BY MOUTH DAILY WITH BREAKFAST 90 tablet 0  . lamoTRIgine (LAMICTAL) 150 MG tablet Take 1 tablet (  150 mg total) by mouth at bedtime. 30 tablet 2  . levothyroxine (SYNTHROID, LEVOTHROID) 100 MCG tablet Take 100 mcg by mouth daily before breakfast.     . losartan (COZAAR) 25 MG tablet TAKE ONE TABLET BY MOUTH DAILY (MORNING) 90 tablet 1  . meclizine (ANTIVERT) 25 MG tablet Take 1 tablet (25 mg total) by mouth 3 (three) times daily as needed for dizziness. (Patient taking differently: Take 25 mg by mouth daily as needed for dizziness. ) 30 tablet 0  . metFORMIN (GLUCOPHAGE) 500 MG tablet TAKE  ONE TABLET BY MOUTH TWICE DAILY WITH A MEAL (MORNING ,EVENING) 180 tablet 0  . montelukast (SINGULAIR) 10 MG tablet Take 10 mg by mouth daily as needed.     . ondansetron (ZOFRAN) 4 MG tablet Take 1 tablet (4 mg total) by mouth 2 (two) times daily as needed for nausea or vomiting. 30 tablet 1  . rOPINIRole (REQUIP) 3 MG tablet TAKE ONE TABLET BY MOUTH EVERY MORNING, ,NOON AND AT BEDTIME. (PRT PT MORNING,EVENING,BEDTIME) 270 tablet 1  . torsemide (DEMADEX) 20 MG tablet TAKE ONE TABLET BY MOUTH ALTERNATING WITH TWO TABLETS daily (PER PT ONE DAILY-EXTRA IN BOTTLE) 135 tablet 3  . traMADol (ULTRAM) 50 MG tablet Take 100 mg by mouth 4 (four) times daily as needed.     . traZODone (DESYREL) 150 MG tablet Take 1 tablet (150 mg total) by mouth at bedtime. 30 tablet 2  . venlafaxine XR (EFFEXOR-XR) 150 MG 24 hr capsule Take 2 capsules (300 mg total) by mouth in the morning. 60 capsule 2   No facility-administered medications prior to visit.    PAST MEDICAL HISTORY: Past Medical History:  Diagnosis Date  . Anemia   . Anxiety   . Bipolar disorder ([place])   . Bulging lumbar disc    L3-4  . Chronic daily headache   . Chronic low back pain 09/20/2014  . COPD (chronic obstructive pulmonary disease) ([place])   . Degenerative arthritis   . Depression   . Diabetes mellitus, type II ([place])   . Diabetic neuropathy ([place]) 09/16/2018  . DM type 2 with diabetic peripheral neuropathy ([place]) 09/20/2014  . Dyslipidemia   . Dyspnea   . Gastroparesis   . GERD (gastroesophageal reflux disease)   . Heart murmur   . History of hiatal hernia   . Hypothyroidism   . IBS (irritable bowel syndrome)   . Memory difficulty 09/20/2014  . Morbid obesity ([place])   . Neuropathy   . Obstructive sleep apnea on CPAP   . Restless legs syndrome (RLS) 09/17/2012  . Stroke (cerebrum) ([place]) 05/16/2017   Left parietal  . Urticaria     PAST SURGICAL HISTORY: Past Surgical History:  Procedure Laterality Date  . ABDOMINAL HYSTERECTOMY    .  ARTHROSCOPY KNEE W/ DRILLING  06/2011   and Decemer of 2013.  [name] Carpel tunnel  1980's  . CATARACT EXTRACTION W/PHACO Right 03/21/2017   Procedure: CATARACT EXTRACTION PHACO AND INTRAOCULAR LENS PLACEMENT RIGHT EYE;  Surgeon: Baruch Goldmann, MD;  Location: [facility];  Service: Ophthalmology;  Laterality: Right;  CDE: 2.91   . CATARACT EXTRACTION W/PHACO Left 04/18/2017   Procedure: CATARACT EXTRACTION PHACO AND INTRAOCULAR LENS PLACEMENT LEFT EYE;  Surgeon: Baruch Goldmann, MD;  Location: [facility];  Service: Ophthalmology;  Laterality: Left;  left  . CHOLECYSTECTOMY    . COLONOSCOPY WITH PROPOFOL N/A 10/04/2016   Procedure: COLONOSCOPY WITH PROPOFOL;  Surgeon: Rogene Houston, MD;  [facility];  Service:  Endoscopy;  Laterality: N/A;  11:10  . CORONARY ARTERY BYPASS GRAFT N/A 08/29/2017   Procedure: CORONARY ARTERY BYPASS GRAFTING (CABG) x 3 WITH ENDOSCOPIC HARVESTING OF RIGHT SAPHENOUS VEIN;  Surgeon: Ivin Poot, MD;  Location: [facility];  Service: Open Heart Surgery;  Laterality: N/A;  . ESOPHAGOGASTRODUODENOSCOPY N/A 04/25/2016   Procedure: ESOPHAGOGASTRODUODENOSCOPY (EGD);  Surgeon: Rogene Houston, MD;  [facility];  Service: Endoscopy;  Laterality: N/A;  730  . LEFT HEART CATH AND CORONARY ANGIOGRAPHY N/A 08/27/2017   Procedure: LEFT HEART CATH AND CORONARY ANGIOGRAPHY;  Surgeon: Sherren Mocha, MD;  Location: [facility];  Service: Cardiovascular::  pLAD 95% - p-mLAD 50%, ostD1 90% -pD1 80%; ostOM1 90%; rPDA 80%. EF ~50-55% - HK of dital Anterolateral & Apical wall.  - Rec CVTS c/s  . RECTAL SURGERY     fissure  . SHOULDER SURGERY Left    arthroscopy in March of this year  . TEE WITHOUT CARDIOVERSION N/A 08/29/2017   Procedure: TRANSESOPHAGEAL ECHOCARDIOGRAM (TEE);  Surgeon: Prescott Gum, Collier Salina, MD;  Location: [facility];  Service: Open Heart Surgery;  Laterality: N/A;  . TRANSTHORACIC ECHOCARDIOGRAM  06/06/2017   Mild to moderate reduced EF 40 and 45%.  Anterior septal,  inferoseptal and basal to mid inferior hypokinesis.  GR 1 DD.  No significant valvular lesion    FAMILY HISTORY: Family History  Problem Relation Age of Onset  . Hypertension Mother   . Lymphoma Mother   . Depression Mother   . Arthritis Father   . Alcohol abuse Father   . Hypertension Sister   . Cancer Brother        kidney and lung  . Alcohol abuse Brother   . Alcohol abuse Paternal Uncle   . Alcohol abuse Paternal Grandfather   . Alcohol abuse Paternal Grandmother   . Allergic rhinitis Neg Hx   . Angioedema Neg Hx   . Asthma Neg Hx   . Atopy Neg Hx   . Eczema Neg Hx   . Immunodeficiency Neg Hx   . Urticaria Neg Hx     SOCIAL HISTORY: Social History   Socioeconomic History  . Marital status: Single    Spouse name: Not on file  . Number of children: 0  . Years of education: 55  . Highest education level: Not on file  Occupational History    Employer: [employer]  Tobacco Use  . Smoking status: Former Smoker    Types: Cigarettes    Quit date: 02/04/1971    Years since quitting: 48.9  . Smokeless tobacco: Never Used  . Tobacco comment: smoked 2 cigarettes a day  Vaping Use  . Vaping Use: Never used  Substance and Sexual Activity  . Alcohol use: No    Alcohol/week: 0.0 standard drinks  . Drug use: No  . Sexual activity: Never  Other Topics Concern  . Not on file  Social History Narrative   Patient lives at home alone.    Patient has no children.    Patient has her masters in nursing.    Patient is single.    Patient drinks about 2 glasses of tea daily.   Patient is right handed.   Social Determinants of Health   Financial Resource Strain:   . Difficulty of Paying Living Expenses: Not on file  Food Insecurity:   . Worried About Charity fundraiser in the Last Year: Not on file  . Ran Out of Food in the Last Year:  Not on file  Transportation Needs:   . Lack of Transportation (Medical): Not on file  . Lack of Transportation (Non-Medical):  Not on file  Physical Activity:   . Days of Exercise per Week: Not on file  . Minutes of Exercise per Session: Not on file  Stress:   . Feeling of Stress : Not on file  Social Connections:   . Frequency of Communication with Friends and Family: Not on file  . Frequency of Social Gatherings with Friends and Family: Not on file  . Attends Religious Services: Not on file  . Active Member of Clubs or Organizations: Not on file  . Attends Archivist Meetings: Not on file  . Marital Status: Not on file  Intimate Partner Violence:   . Fear of Current or Ex-Partner: Not on file  . Emotionally Abused: Not on file  . Physically Abused: Not on file  . Sexually Abused: Not on file      PHYSICAL EXAM  There were no vitals filed for this visit. There is no height or weight on file to calculate BMI.  Generalized: Well developed, in no acute distress   Neurological examination  Mentation: Alert oriented to time, place, history taking. Follows all commands speech and language fluent Cranial nerve II-XII: Pupils were equal round reactive to light. Extraocular movements were full, visual field were full on confrontational test. Facial sensation and strength were normal. Uvula tongue midline. Head turning and shoulder shrug  were normal and symmetric. Motor: The motor testing reveals 5 over 5 strength of all 4 extremities. Good symmetric motor tone is noted throughout.  Sensory: Sensory testing is intact to soft touch on all 4 extremities. No evidence of extinction is noted.  Coordination: Cerebellar testing reveals good finger-nose-finger and heel-to-shin bilaterally.  Gait and station: Gait is normal. Tandem gait is normal. Romberg is negative. No drift is seen.  Reflexes: Deep tendon reflexes are symmetric and normal bilaterally.   DIAGNOSTIC DATA (LABS, IMAGING, TESTING) - I reviewed patient records, labs, notes, testing and imaging myself where available.  Lab Results  Component  Value Date   WBC 11.0 (H) 06/15/2019   HGB 11.6 (L) 06/15/2019   HCT 34.4 (L) 06/15/2019   MCV 89.8 06/15/2019   PLT 460 (H) 06/15/2019      Component Value Date/Time   NA 140 04/23/2019 1511   K 4.4 04/23/2019 1511   CL 102 04/23/2019 1511   CO2 24 04/23/2019 1511   GLUCOSE 194 (H) 04/23/2019 1511   GLUCOSE 145 (H) 03/05/2019 1221   BUN 21 11/08/2019 0000   CREATININE 1.4 (A) 11/08/2019 0000   CREATININE 1.18 (H) 04/23/2019 1511   CREATININE 1.10 (H) 03/05/2019 1221   CALCIUM 9.3 04/23/2019 1511   PROT 6.7 04/23/2019 1511   ALBUMIN 3.7 (L) 04/23/2019 1511   AST 14 04/23/2019 1511   ALT 14 04/23/2019 1511   ALKPHOS 73 04/23/2019 1511   BILITOT <0.2 04/23/2019 1511   GFRNONAA 48 (L) 04/23/2019 1511   GFRNONAA 53 (L) 03/05/2019 1221   GFRAA 56 (L) 04/23/2019 1511   GFRAA 61 03/05/2019 1221   Lab Results  Component Value Date   CHOL 129 11/08/2019   HDL 60 11/08/2019   LDLCALC 43 11/08/2019   TRIG 158 11/08/2019   CHOLHDL 3.8 03/05/2019   Lab Results  Component Value Date   HGBA1C 6.6 11/08/2019   Lab Results  Component Value Date   VITAMINB12 240 09/20/2014   Lab Results  Component  Value Date   TSH 1.49 03/05/2019      ASSESSMENT AND PLAN 69 y.o. year old female  has a past medical history of Anemia, Anxiety, Bipolar disorder ([place]), Bulging lumbar disc, Chronic daily headache, Chronic low back pain (09/20/2014), COPD (chronic obstructive pulmonary disease) ([place]), Degenerative arthritis, Depression, Diabetes mellitus, type II ([place]), Diabetic neuropathy ([place]) (09/16/2018), DM type 2 with diabetic peripheral neuropathy ([place]) (09/20/2014), Dyslipidemia, Dyspnea, Gastroparesis, GERD (gastroesophageal reflux disease), Heart murmur, History of hiatal hernia, Hypothyroidism, IBS (irritable bowel syndrome), Memory difficulty (09/20/2014), Morbid obesity ([place]), Neuropathy, Obstructive sleep apnea on CPAP, Restless legs syndrome (RLS) (09/17/2012), Stroke (cerebrum) ([place]) (05/16/2017),  and Urticaria. here with ***   I spent 15 minutes with the patient. 50% of this time was spent   Butler Denmark, [place], DNP 01/17/2020, 2:19 PM [facility] [address], [place], [place] [address] [phone]

## 2020-01-17 NOTE — Telephone Encounter (Signed)
Patient is currently scheduled for an initial CPAP follow up tomorrow with Norma [name]. I called [facility] to see if we could be attached to her Air[facility]. Spoke with Norma [name] who stated that the patient only received O2 from them, not CPAP. Per their records, patient received a new cpap machine from [facility] about 1-2 years ago and is still under contract with them.   I called [facility] at [phone]. Was advised that she has not contacted them in a while for supplies or maintenance on her machine. In order for [place] to get a download, she will need to take her machine to their office. Did state that they would accept the prescription for the pressure setting change.   Spoke with Norma [name]. She stated that we can print the RX from back in August and fax to [facility]. Patient will need to push out her appt for another 2-3 months.   Will call and speak to patient about this.

## 2020-01-18 ENCOUNTER — Ambulatory Visit: Payer: Self-pay | Admitting: Neurology

## 2020-01-19 ENCOUNTER — Other Ambulatory Visit: Payer: Self-pay | Admitting: "Endocrinology

## 2020-01-20 ENCOUNTER — Telehealth: Payer: Self-pay | Admitting: Neurology

## 2020-01-20 NOTE — Telephone Encounter (Signed)
Received the ONO report that was completed with the patient on CPAP and oxygen at 2L. Will have Dr Dohmeier review and notify pt with results and recommendations.

## 2020-01-21 ENCOUNTER — Telehealth: Payer: Self-pay | Admitting: Neurology

## 2020-01-21 NOTE — Telephone Encounter (Signed)
  Mrs. Norma [name] has undergone a overnight pulse oximetry while using positive airway pressure therapy and being supplemented with 2 L of oxygen per minute.  Start of the recording was 11:20 PM on 28 September,  end of the study was 12 January 2020, at 10:48 AM.  Total recording time was over 11 hours.   Only 21 minutes of the recording time displayed oxygen levels at or below 89% saturation.  There were 101 oxygen desaturation events in total.  The patient desaturated many times as low as 72% percent saturation of oxygen but did not remain for long at these levels.  The lowest oxygen saturations were noted in the early morning hours and may also be related to activity, such as going to the bathroom. The pulse monitor corresponded with high variability as seen by motion artefact.   So while the patient remains qualified for oxygen, it seems that she would need to go through her PCP, pulmonologist or cardiologist for a slight increase from 2 to 3 L/min covering her- not during sleep, but during physical activity in the morning.  Jeb Levering, MD

## 2020-01-21 NOTE — Telephone Encounter (Signed)
See dictation from 01-21-2020 , phone note.

## 2020-01-26 ENCOUNTER — Encounter: Payer: Self-pay | Admitting: Neurology

## 2020-01-28 ENCOUNTER — Telehealth: Payer: Self-pay | Admitting: Cardiology

## 2020-01-28 NOTE — Telephone Encounter (Signed)
Patient called stating that she had a recent sleep study . States that she was told to contact Dr. Harl Bowie in regards to her HR and oxygen dropping during the study.

## 2020-01-31 ENCOUNTER — Telehealth: Payer: Self-pay | Admitting: Cardiology

## 2020-01-31 ENCOUNTER — Other Ambulatory Visit (HOSPITAL_COMMUNITY): Payer: Self-pay | Admitting: Psychiatry

## 2020-01-31 DIAGNOSIS — M1990 Unspecified osteoarthritis, unspecified site: Secondary | ICD-10-CM | POA: Diagnosis not present

## 2020-01-31 DIAGNOSIS — E1122 Type 2 diabetes mellitus with diabetic chronic kidney disease: Secondary | ICD-10-CM | POA: Diagnosis not present

## 2020-01-31 DIAGNOSIS — E1121 Type 2 diabetes mellitus with diabetic nephropathy: Secondary | ICD-10-CM | POA: Diagnosis not present

## 2020-01-31 DIAGNOSIS — F3181 Bipolar II disorder: Secondary | ICD-10-CM | POA: Diagnosis not present

## 2020-01-31 DIAGNOSIS — E782 Mixed hyperlipidemia: Secondary | ICD-10-CM | POA: Diagnosis not present

## 2020-01-31 DIAGNOSIS — N1832 Chronic kidney disease, stage 3b: Secondary | ICD-10-CM | POA: Diagnosis not present

## 2020-01-31 DIAGNOSIS — L501 Idiopathic urticaria: Secondary | ICD-10-CM | POA: Diagnosis not present

## 2020-01-31 DIAGNOSIS — G2581 Restless legs syndrome: Secondary | ICD-10-CM | POA: Diagnosis not present

## 2020-01-31 DIAGNOSIS — R296 Repeated falls: Secondary | ICD-10-CM | POA: Diagnosis not present

## 2020-01-31 DIAGNOSIS — I5022 Chronic systolic (congestive) heart failure: Secondary | ICD-10-CM | POA: Diagnosis not present

## 2020-01-31 DIAGNOSIS — F5101 Primary insomnia: Secondary | ICD-10-CM | POA: Diagnosis not present

## 2020-01-31 DIAGNOSIS — J410 Simple chronic bronchitis: Secondary | ICD-10-CM | POA: Diagnosis not present

## 2020-01-31 DIAGNOSIS — Z23 Encounter for immunization: Secondary | ICD-10-CM | POA: Diagnosis not present

## 2020-01-31 DIAGNOSIS — Z0189 Encounter for other specified special examinations: Secondary | ICD-10-CM | POA: Diagnosis not present

## 2020-01-31 NOTE — Telephone Encounter (Signed)
Lab orders sent to Dr [name] via [facility]

## 2020-01-31 NOTE — Telephone Encounter (Signed)
Patient notified and verbalized understanding.  Patient request that I send copy of this note to Dr. Brett Fairy with [facility].    Notes fwd now.

## 2020-01-31 NOTE — Telephone Encounter (Signed)
From note would need to contact the doctor that is providing her oxygen, looks like they are recommending more O2 in the morning with activity, we have not been managing her O2, perhaps her pcp  Zandra Abts MD

## 2020-01-31 NOTE — Telephone Encounter (Signed)
Pt called stating she has an apt w/ her PCP this afternoon and she'd like her lab orders sent there to [facility]- Dr. Juel Burrow office

## 2020-01-31 NOTE — Telephone Encounter (Signed)
Fwd to provider for advice.

## 2020-02-02 ENCOUNTER — Ambulatory Visit: Payer: Medicare Other | Admitting: Orthopaedic Surgery

## 2020-02-09 ENCOUNTER — Telehealth (INDEPENDENT_AMBULATORY_CARE_PROVIDER_SITE_OTHER): Payer: Medicare Other | Admitting: Psychiatry

## 2020-02-09 ENCOUNTER — Other Ambulatory Visit: Payer: Self-pay

## 2020-02-09 ENCOUNTER — Encounter (HOSPITAL_COMMUNITY): Payer: Self-pay | Admitting: Psychiatry

## 2020-02-09 DIAGNOSIS — I200 Unstable angina: Secondary | ICD-10-CM

## 2020-02-09 DIAGNOSIS — F331 Major depressive disorder, recurrent, moderate: Secondary | ICD-10-CM

## 2020-02-09 MED ORDER — TRAZODONE HCL 150 MG PO TABS
150.0000 mg | ORAL_TABLET | Freq: Every day | ORAL | 2 refills | Status: DC
Start: 2020-02-09 — End: 2020-05-08

## 2020-02-09 MED ORDER — LAMOTRIGINE 150 MG PO TABS
150.0000 mg | ORAL_TABLET | Freq: Every day | ORAL | 2 refills | Status: DC
Start: 2020-02-09 — End: 2020-05-02

## 2020-02-09 MED ORDER — VENLAFAXINE HCL ER 150 MG PO CP24
300.0000 mg | ORAL_CAPSULE | Freq: Every morning | ORAL | 2 refills | Status: DC
Start: 2020-02-09 — End: 2020-05-02

## 2020-02-09 MED ORDER — ALPRAZOLAM 0.25 MG PO TABS: 0.2500 mg | ORAL_TABLET | Freq: Two times a day (BID) | ORAL | 2 refills | Status: DC | PRN

## 2020-02-09 MED ORDER — BUPROPION HCL ER (XL) 300 MG PO TB24
300.0000 mg | ORAL_TABLET | Freq: Every morning | ORAL | 2 refills | Status: DC
Start: 2020-02-09 — End: 2020-05-02

## 2020-02-09 NOTE — Progress Notes (Signed)
Virtual Visit via Telephone Note  I connected with Norma [name] on 02/09/20 at  1:00 PM EDT by telephone and verified that I am speaking with the correct person using two identifiers.  Location: Patient: home Provider: home   I discussed the limitations, risks, security and privacy concerns of performing an evaluation and management service by telephone and the availability of in person appointments. I also discussed with the patient that there may be a patient responsible charge related to this service. The patient expressed understanding and agreed to proceed.    I discussed the assessment and treatment plan with the patient. The patient was provided an opportunity to ask questions and all were answered. The patient agreed with the plan and demonstrated an understanding of the instructions.   The patient was advised to call back or seek an in-person evaluation if the symptoms worsen or if the condition fails to improve as anticipated.  I provided 15 minutes of non-face-to-face time during this encounter.   Norma Spiller, MD  [facility] MD/PA/NP OP Progress Note  02/09/2020 1:16 PM Norma [name]  MRN:  [id number]  Chief Complaint:  Chief Complaint    Depression; Anxiety; Follow-up     HPI: This patient is a 69 year old single white female who lives alone in [place]. 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  The patient returns for follow-up after 3 months. She states for the most part she has been stable. She is still dealing with chronic back and knee pain and there is not much she can do because of chronic arthritis. She still has to lose more weight to qualify for knee replacement surgery.  She states that currently she is not having too many stressors other than having to pay her bills. Her nephew is no longer living at home with her. Her home life is pretty quiet. She is sleeping much better now that she has a CPAP. She denies significant depression or anxiety symptoms. Visit  Diagnosis:    ICD-10-CM   1. Moderate episode of recurrent major depressive disorder ([place])  F33.1     Past Psychiatric History: She was hospitalized in 2001 and went through a course of ECT. She has been tried on [employer] Wellbutrin and Zyprexa  Past Medical History:  Past Medical History:  Diagnosis Date  . Anemia   . Anxiety   . Bipolar disorder ([place])   . Bulging lumbar disc    L3-4  . Chronic daily headache   . Chronic low back pain 09/20/2014  . COPD (chronic obstructive pulmonary disease) ([place])   . Degenerative arthritis   . Depression   . Diabetes mellitus, type II ([place])   . Diabetic neuropathy ([place]) 09/16/2018  . DM type 2 with diabetic peripheral neuropathy ([place]) 09/20/2014  . Dyslipidemia   . Dyspnea   . Gastroparesis   . GERD (gastroesophageal reflux disease)   . Heart murmur   . History of hiatal hernia   . Hypothyroidism   . IBS (irritable bowel syndrome)   . Memory difficulty 09/20/2014  . Morbid obesity ([place])   . Neuropathy   . Obstructive sleep apnea on CPAP   . Restless legs syndrome (RLS) 09/17/2012  . Stroke (cerebrum) ([place]) 05/16/2017   Left parietal  . Urticaria     Past Surgical History:  Procedure Laterality Date  . ABDOMINAL HYSTERECTOMY    . ARTHROSCOPY KNEE W/ DRILLING  06/2011   and Decemer of 2013.  [name] Carpel tunnel  1980's  .  CATARACT EXTRACTION W/PHACO Right 03/21/2017   Procedure: CATARACT EXTRACTION PHACO AND INTRAOCULAR LENS PLACEMENT RIGHT EYE;  Surgeon: Baruch Goldmann, MD;  Location: [facility];  Service: Ophthalmology;  Laterality: Right;  CDE: 2.91   . CATARACT EXTRACTION W/PHACO Left 04/18/2017   Procedure: CATARACT EXTRACTION PHACO AND INTRAOCULAR LENS PLACEMENT LEFT EYE;  Surgeon: Baruch Goldmann, MD;  Location: [facility];  Service: Ophthalmology;  Laterality: Left;  left  . CHOLECYSTECTOMY    . COLONOSCOPY WITH PROPOFOL N/A 10/04/2016   Procedure: COLONOSCOPY WITH PROPOFOL;  Surgeon: Rogene Houston, MD;  [facility];  Service:  Endoscopy;  Laterality: N/A;  11:10  . CORONARY ARTERY BYPASS GRAFT N/A 08/29/2017   Procedure: CORONARY ARTERY BYPASS GRAFTING (CABG) x 3 WITH ENDOSCOPIC HARVESTING OF RIGHT SAPHENOUS VEIN;  Surgeon: Ivin Poot, MD;  Location: [facility];  Service: Open Heart Surgery;  Laterality: N/A;  . ESOPHAGOGASTRODUODENOSCOPY N/A 04/25/2016   Procedure: ESOPHAGOGASTRODUODENOSCOPY (EGD);  Surgeon: Rogene Houston, MD;  [facility];  Service: Endoscopy;  Laterality: N/A;  730  . LEFT HEART CATH AND CORONARY ANGIOGRAPHY N/A 08/27/2017   Procedure: LEFT HEART CATH AND CORONARY ANGIOGRAPHY;  Surgeon: Sherren Mocha, MD;  Location: [facility];  Service: Cardiovascular::  pLAD 95% - p-mLAD 50%, ostD1 90% -pD1 80%; ostOM1 90%; rPDA 80%. EF ~50-55% - HK of dital Anterolateral & Apical wall.  - Rec CVTS c/s  . RECTAL SURGERY     fissure  . SHOULDER SURGERY Left    arthroscopy in March of this year  . TEE WITHOUT CARDIOVERSION N/A 08/29/2017   Procedure: TRANSESOPHAGEAL ECHOCARDIOGRAM (TEE);  Surgeon: Prescott Gum, Collier Salina, MD;  Location: [facility];  Service: Open Heart Surgery;  Laterality: N/A;  . TRANSTHORACIC ECHOCARDIOGRAM  06/06/2017   Mild to moderate reduced EF 40 and 45%.  Anterior septal, inferoseptal and basal to mid inferior hypokinesis.  GR 1 DD.  No significant valvular lesion    Family Psychiatric History: see below  Family History:  Family History  Problem Relation Age of Onset  . Hypertension Mother   . Lymphoma Mother   . Depression Mother   . Arthritis Father   . Alcohol abuse Father   . Hypertension Sister   . Cancer Brother        kidney and lung  . Alcohol abuse Brother   . Alcohol abuse Paternal Uncle   . Alcohol abuse Paternal Grandfather   . Alcohol abuse Paternal Grandmother   . Allergic rhinitis Neg Hx   . Angioedema Neg Hx   . Asthma Neg Hx   . Atopy Neg Hx   . Eczema Neg Hx   . Immunodeficiency Neg Hx   . Urticaria Neg Hx     Social History:  Social  History   Socioeconomic History  . Marital status: Single    Spouse name: Not on file  . Number of children: 0  . Years of education: 64  . Highest education level: Not on file  Occupational History    Employer: [employer]  Tobacco Use  . Smoking status: Former Smoker    Types: Cigarettes    Quit date: 02/04/1971    Years since quitting: 49.0  . Smokeless tobacco: Never Used  . Tobacco comment: smoked 2 cigarettes a day  Vaping Use  . Vaping Use: Never used  Substance and Sexual Activity  . Alcohol use: No    Alcohol/week: 0.0 standard drinks  . Drug use: No  . Sexual  activity: Never  Other Topics Concern  . Not on file  Social History Narrative   Patient lives at home alone.    Patient has no children.    Patient has her masters in nursing.    Patient is single.    Patient drinks about 2 glasses of tea daily.   Patient is right handed.   Social Determinants of Health   Financial Resource Strain:   . Difficulty of Paying Living Expenses: Not on file  Food Insecurity:   . Worried About Charity fundraiser in the Last Year: Not on file  . Ran Out of Food in the Last Year: Not on file  Transportation Needs:   . Lack of Transportation (Medical): Not on file  . Lack of Transportation (Non-Medical): Not on file  Physical Activity:   . Days of Exercise per Week: Not on file  . Minutes of Exercise per Session: Not on file  Stress:   . Feeling of Stress : Not on file  Social Connections:   . Frequency of Communication with Friends and Family: Not on file  . Frequency of Social Gatherings with Friends and Family: Not on file  . Attends Religious Services: Not on file  . Active Member of Clubs or Organizations: Not on file  . Attends Archivist Meetings: Not on file  . Marital Status: Not on file    Allergies:  Allergies  Allergen Reactions  . Amoxicillin Anaphylaxis and Other (See Comments)    Has patient had a PCN reaction causing immediate  rash, facial/tongue/throat swelling, SOB or lightheadedness with hypotension: Yes Has patient had a PCN reaction causing severe rash involving mucus membranes or skin necrosis: Yes Has patient had a PCN reaction that required hospitalization: Yes Has patient had a PCN reaction occurring within the last 10 years: Yes If all of the above answers are "NO", then may proceed with Cephalosporin use.   [name] Hydrocodone Anaphylaxis  . Percocet [Oxycodone-Acetaminophen] Other (See Comments)    Causes chest pain /tightness. Pressure pain around her ribs.  . Vancomycin Itching  . Depacon [Valproic Acid] Other (See Comments)    Causes falls   . Iron Nausea And Vomiting    Metabolic Disorder Labs: Lab Results  Component Value Date   HGBA1C 6.6 11/08/2019   MPG 148 03/05/2019   MPG 136.98 10/30/2018   No results found for: PROLACTIN Lab Results  Component Value Date   CHOL 129 11/08/2019   TRIG 158 11/08/2019   HDL 60 11/08/2019   CHOLHDL 3.8 03/05/2019   VLDL 31 (H) 07/10/2016   LDLCALC 43 11/08/2019   LDLCALC 152 (H) 03/05/2019   Lab Results  Component Value Date   TSH 1.49 03/05/2019   TSH 2.671 10/30/2018    Therapeutic Level Labs: No results found for: LITHIUM No results found for: VALPROATE No components found for:  CBMZ  Current Medications: Current Outpatient Medications  Medication Sig Dispense Refill  . ALPRAZolam (XANAX) 0.25 MG tablet Take 1 tablet (0.25 mg total) by mouth 2 (two) times daily as needed for anxiety. 60 tablet 2  . aspirin EC 81 MG tablet Take 1 tablet (81 mg total) by mouth daily. 90 tablet 3  . baclofen (LIORESAL) 10 MG tablet TAKE ONE TABLET BY MOUTH AT BEDTIME. 30 tablet 6  . buPROPion (WELLBUTRIN XL) 300 MG 24 hr tablet Take 1 tablet (300 mg total) by mouth every morning. 30 tablet 2  . calcium carbonate (TUMS - DOSED IN MG ELEMENTAL  CALCIUM) 500 MG chewable tablet Chew 2 tablets by mouth as needed for indigestion or heartburn.     . carvedilol  (COREG) 6.25 MG tablet TAKE ONE TABLET BY MOUTH TWICE DAILY (MORNING ,EVENING) 180 tablet 2  . cetirizine (ZYRTEC) 10 MG tablet Take 1 tablet tablet 1-2 times daily. (Patient taking differently: Take 10 mg by mouth daily. Take 1 tablet tablet 1-2 times daily.) 60 tablet 5  . diclofenac Sodium (VOLTAREN) 1 % GEL Apply topically 4 (four) times daily.    . diphenhydrAMINE (BENADRYL) 25 mg capsule Take 25 mg by mouth as needed.    [name] EPINEPHrine (EPIPEN 2-PAK) 0.3 mg/0.3 mL IJ SOAJ injection Inject 0.3 mg into the muscle once.    [name] esomeprazole (NEXIUM) 40 MG capsule Take 40 mg by mouth daily before supper.   12  . Evolocumab (REPATHA SURECLICK) 557 MG/ML SOAJ Inject 1 Dose into the skin every 14 (fourteen) days. 2 pen 11  . famotidine (PEPCID) 20 MG tablet Take 1 tablet (20 mg total) by mouth daily. (Patient taking differently: Take 20 mg by mouth at bedtime. ) 30 tablet 5  . gabapentin (NEURONTIN) 400 MG capsule Take 1 capsule (400 mg total) by mouth 3 (three) times daily. 270 capsule 1  . glipiZIDE (GLUCOTROL XL) 5 MG 24 hr tablet TAKE ONE TABLET BY MOUTH DAILY WITH BREAKFAST 90 tablet 0  . lamoTRIgine (LAMICTAL) 150 MG tablet Take 1 tablet (150 mg total) by mouth at bedtime. 30 tablet 2  . levothyroxine (SYNTHROID, LEVOTHROID) 100 MCG tablet Take 100 mcg by mouth daily before breakfast.     . losartan (COZAAR) 25 MG tablet TAKE ONE TABLET BY MOUTH DAILY (MORNING) 90 tablet 1  . meclizine (ANTIVERT) 25 MG tablet Take 1 tablet (25 mg total) by mouth 3 (three) times daily as needed for dizziness. (Patient taking differently: Take 25 mg by mouth daily as needed for dizziness. ) 30 tablet 0  . metFORMIN (GLUCOPHAGE) 500 MG tablet TAKE ONE TABLET BY MOUTH TWICE DAILY WITH A MEAL (MORNING ,EVENING) 180 tablet 0  . montelukast (SINGULAIR) 10 MG tablet Take 10 mg by mouth daily as needed.     . ondansetron (ZOFRAN) 4 MG tablet Take 1 tablet (4 mg total) by mouth 2 (two) times daily as needed for nausea or  vomiting. 30 tablet 1  . rOPINIRole (REQUIP) 3 MG tablet TAKE ONE TABLET BY MOUTH EVERY MORNING, ,NOON AND AT BEDTIME. (PRT PT MORNING,EVENING,BEDTIME) 270 tablet 1  . traMADol (ULTRAM) 50 MG tablet Take 100 mg by mouth 4 (four) times daily as needed.     . traZODone (DESYREL) 150 MG tablet Take 1 tablet (150 mg total) by mouth at bedtime. 30 tablet 2  . venlafaxine XR (EFFEXOR-XR) 150 MG 24 hr capsule Take 2 capsules (300 mg total) by mouth in the morning. 60 capsule 2   No current facility-administered medications for this visit.     Musculoskeletal: Strength & Muscle Tone: within normal limits Gait & Station:unsteady Patient leans: N/A  Psychiatric Specialty Exam: Review of Systems  Musculoskeletal: Positive for arthralgias, back pain and myalgias.  All other systems reviewed and are negative.   There were no vitals taken for this visit.There is no height or weight on file to calculate BMI.  General Appearance: NA  Eye Contact:  NA  Speech:  Clear and Coherent  Volume:  Normal  Mood:  Euthymic  Affect:  NA  Thought Process:  Goal Directed  Orientation:  Full (Time, Place,  and Person)  Thought Content: Rumination   Suicidal Thoughts:  No  Homicidal Thoughts:  No  Memory:  Immediate;   Good Recent;   Good Remote;   Fair  Judgement:  Good  Insight:  Fair  Psychomotor Activity:  Decreased  Concentration:  Concentration: Good and Attention Span: Good  Recall:  Good  Fund of Knowledge: Good  Language: Good  Akathisia:  No  Handed:  Right  AIMS (if indicated): not done  Assets:  Communication Skills Desire for Improvement Resilience Social Support Talents/Skills  ADL's:  Intact  Cognition: WNL  Sleep:  Good   Screenings: Mini-Mental     Office Visit from 02/18/2018 in [facility] Office Visit from 09/17/2017 in [facility]  Total Score (max 30 points ) 28 28    PHQ2-9     Counselor from 05/17/2019 in [facility] Office Visit from 09/11/2017 in [facility] Office Visit from 04/22/2017 in [facility] Office Visit from 12/06/2016 in [facility] Office Visit from 07/16/2016 in [facility]  PHQ-2 Total Score 1 0 0 0 0       Assessment and Plan: This patient is a 69 year old female with a history of depression anxiety mood swings and insomnia. She is sleeping better with CPAP. For now she will continue Effexor XR 300 mg every morning as well as Wellbutrin XL 300 mg daily for depression, Lamictal 150 mg daily for mood swings, Xanax 0.25 mg twice daily for anxiety and trazodone 150 mg at bedtime for sleep. She will return to see me in 3 months   Norma Spiller, MD 02/09/2020, 1:16 PM

## 2020-02-28 ENCOUNTER — Other Ambulatory Visit (INDEPENDENT_AMBULATORY_CARE_PROVIDER_SITE_OTHER): Payer: Self-pay | Admitting: *Deleted

## 2020-02-28 DIAGNOSIS — D649 Anemia, unspecified: Secondary | ICD-10-CM

## 2020-02-29 ENCOUNTER — Other Ambulatory Visit: Payer: Self-pay | Admitting: Cardiology

## 2020-02-29 ENCOUNTER — Other Ambulatory Visit (INDEPENDENT_AMBULATORY_CARE_PROVIDER_SITE_OTHER): Payer: Self-pay | Admitting: Internal Medicine

## 2020-02-29 DIAGNOSIS — D649 Anemia, unspecified: Secondary | ICD-10-CM | POA: Diagnosis not present

## 2020-03-01 ENCOUNTER — Ambulatory Visit (INDEPENDENT_AMBULATORY_CARE_PROVIDER_SITE_OTHER): Payer: Medicare Other | Admitting: Orthopaedic Surgery

## 2020-03-01 ENCOUNTER — Encounter: Payer: Self-pay | Admitting: Orthopaedic Surgery

## 2020-03-01 ENCOUNTER — Other Ambulatory Visit: Payer: Self-pay

## 2020-03-01 ENCOUNTER — Ambulatory Visit (INDEPENDENT_AMBULATORY_CARE_PROVIDER_SITE_OTHER): Payer: Medicare Other

## 2020-03-01 VITALS — Ht 60.0 in

## 2020-03-01 DIAGNOSIS — M5442 Lumbago with sciatica, left side: Secondary | ICD-10-CM

## 2020-03-01 DIAGNOSIS — M5441 Lumbago with sciatica, right side: Secondary | ICD-10-CM | POA: Diagnosis not present

## 2020-03-01 DIAGNOSIS — M545 Low back pain, unspecified: Secondary | ICD-10-CM | POA: Diagnosis not present

## 2020-03-01 DIAGNOSIS — G8929 Other chronic pain: Secondary | ICD-10-CM

## 2020-03-01 DIAGNOSIS — I200 Unstable angina: Secondary | ICD-10-CM | POA: Diagnosis not present

## 2020-03-01 LAB — IRON,TIBC AND FERRITIN PANEL
%SAT: 16 % (calc) (ref 16–45)
Ferritin: 10 ng/mL — ABNORMAL LOW (ref 16–288)
Iron: 62 ug/dL (ref 45–160)
TIBC: 398 mcg/dL (calc) (ref 250–450)

## 2020-03-01 NOTE — Progress Notes (Signed)
Office Visit Note   Patient: Norma [name]           Date of Birth: [birth year]           MRN: [id number] Visit Date: 03/01/2020              Requested by: Celene Squibb, MD [place],  [place] [address] PCP: Celene Squibb, MD   Assessment & Plan: Visit Diagnoses:  1. Chronic bilateral low back pain with bilateral sciatica   2. Morbid obesity, unspecified obesity type ([place])   3. Chronic right-sided low back pain without sciatica     Plan: [name] has had exacerbation of her chronic low back pain.  She presently is experiencing back pain associated with the right buttock and right leg pain.  She has been evaluated by her primary care physician and is on gabapentin for her chronic diabetic neuropathy as well as Ultram.  They tried prednisone and a muscle relaxant without much relief.  I have seen her in the past and obtained MRI scans.  Her last was in June 2020.  This demonstrated no significant change from the appearance of the lumbar spine since 2016 and there were minimal diffuse degenerative disc disease but without evidence of foraminal spinal stenosis.  There was slight chronic facet arthritis in the lower lumbar spine.  Her exam today was relatively benign in terms of her back.  I think it is worth trying a course of physical therapy and then having her return in a month or so if not much improvement.  She does have an issue with transportation and would have difficulty with injections performed in [place] Instructions: Return if symptoms worsen or fail to improve.   Orders:  Orders Placed This Encounter  Procedures  . XR Lumbar Spine 2-3 Views   No orders of the defined types were placed in this encounter.     Procedures: No procedures performed   Clinical Data: No additional findings.   Subjective: Chief Complaint  Patient presents with  . Lower Back - Pain  Patient presents today for lower back pain. She said that it has been  hurting for a month. No injury. Her pain radiates down both legs. The right leg pain is worse than the left. She has neuropathy in both lower legs. She has been taking Ultram, CBD, and Gabapentin.  Her primary care physician tried prednisone and a muscle relaxant without much relief.  She has been seen on number of occasions in the past with MRI scan performed in June 2020 demonstrating some degenerative changes but no neural compression.  There is been no history of injury or trauma  HPI  Review of Systems   Objective: Vital Signs: Ht 5' (1.524 m)   BMI 44.14 kg/m   Physical Exam Constitutional:      Appearance: She is well-developed.  Eyes:     Pupils: Pupils are equal, round, and reactive to light.  Pulmonary:     Effort: Pulmonary effort is normal.  Skin:    General: Skin is warm and dry.  Neurological:     Mental Status: She is alert and oriented to person, place, and time.  Psychiatric:        Behavior: Behavior normal.     Ortho Exam and oriented x3.  Comfortable sitting.  Does use a walker for arthritis of her knees, balance and the problem with her back.  She is overweight at 226 pounds.  Straight leg raise is negative.  Does have significant issues with both of her knees lacking about 15 degrees of full extension bilaterally and flexed only about 90 degrees.  No instability.  She has a prior history of arthritis of both knees.  Painless range of motion both hips.  Has altered sensibility of both of her feet related to her diabetes.  Toes were warm Specialty Comments:  No specialty comments available.  Imaging: XR Lumbar Spine 2-3 Views  Result Date: 03/01/2020 Numbness of the lumbar spine were obtained in 2 projections.  There is degenerative disc disease at L2-3 L3-4 with narrowing of the disc space there are some anterior osteophytes but the canal appears to be open.  Facet sclerosis at L4-5 and L5-S1.  No listhesis.  Does have a right-sided degenerative scoliosis at  the apex L2 but no evidence of a compression fracture    PMFS History: Patient Active Problem List   Diagnosis Date Noted  . Low back pain 03/01/2020  . Hypoxemia 09/23/2019  . Sleep related hypoxia 09/23/2019  . Obesity with alveolar hypoventilation and body mass index (BMI) of 40 or greater ([place]) 09/23/2019  . Hypersomnia with long sleep time, idiopathic 08/16/2019  . Insomnia due to other mental disorder 08/16/2019  . Dry mouth not due to sicca syndrome 08/16/2019  . Chronic coughing 08/16/2019  . Unilateral primary osteoarthritis, right knee 02/18/2019  . Bilateral primary osteoarthritis of knee 01/20/2019  . Diabetic neuropathy ([place]) 09/16/2018  . Unilateral primary osteoarthritis, left knee 09/16/2018  . Class 3 severe obesity due to excess calories with serious comorbidity and body mass index (BMI) of 45.0 to 49.9 in adult ([place]) 02/20/2018  . Mixed hyperlipidemia 09/11/2017  . Pressure injury of skin 09/02/2017  . Coronary artery disease involving native heart without angina pectoris   . S/P CABG x 3   . Leukocytosis   . Acute blood loss anemia   . Post-operative pain   . Bipolar affective disorder ([place])   . Diabetes mellitus type 2 in obese ([place])   . History of CVA (cerebrovascular accident)   . Hypokalemia   . Hx of CABG 08/29/2017  . Coronary artery disease involving native coronary artery of native heart with unstable angina pectoris ([place]) 08/28/2017  . Unstable angina ([place]) 08/27/2017  . Cardiomyopathy ([place]) 08/22/2017  . Stroke (cerebrum) ([place]) 05/16/2017  . Anemia 09/19/2016  . Dizziness 06/21/2016  . Chest pain with moderate risk for cardiac etiology 04/23/2016  . Bipolar I disorder, most recent episode depressed ([place]) 06/05/2015  . DM type 2 causing vascular disease ([place]) 09/20/2014  . Chronic bilateral low back pain with bilateral sciatica 09/20/2014  . Morbid obesity, unspecified obesity type ([place]) 09/20/2014  . Memory difficulty 09/20/2014  . Lumbosacral  spondylosis without myelopathy 12/25/2012  . Gastroparesis 11/04/2012  . Nausea with vomiting 11/04/2012  . Restless legs syndrome (RLS) 09/17/2012  . Headache(784.0) 03/19/2012  . GERD (gastroesophageal reflux disease) 02/16/2012  . Abdominal pain 02/16/2012  . Hypothyroidism 02/16/2012  . Tremor 02/16/2012  . Hyperlipidemia associated with type 2 diabetes mellitus ([place]) 04/01/2008  . OSA (obstructive sleep apnea) 04/01/2008  . Essential hypertension, benign 04/01/2008  . LBBB (left bundle branch block) 04/01/2008   Past Medical History:  Diagnosis Date  . Anemia   . Anxiety   . Bipolar disorder ([place])   . Bulging lumbar disc    L3-4  . Chronic daily headache   . Chronic low back pain 09/20/2014  . COPD (chronic obstructive pulmonary  disease) ([place])   . Degenerative arthritis   . Depression   . Diabetes mellitus, type II ([place])   . Diabetic neuropathy ([place]) 09/16/2018  . DM type 2 with diabetic peripheral neuropathy ([place]) 09/20/2014  . Dyslipidemia   . Dyspnea   . Gastroparesis   . GERD (gastroesophageal reflux disease)   . Heart murmur   . History of hiatal hernia   . Hypothyroidism   . IBS (irritable bowel syndrome)   . Memory difficulty 09/20/2014  . Morbid obesity ([place])   . Neuropathy   . Obstructive sleep apnea on CPAP   . Restless legs syndrome (RLS) 09/17/2012  . Stroke (cerebrum) ([place]) 05/16/2017   Left parietal  . Urticaria     Family History  Problem Relation Age of Onset  . Hypertension Mother   . Lymphoma Mother   . Depression Mother   . Arthritis Father   . Alcohol abuse Father   . Hypertension Sister   . Cancer Brother        kidney and lung  . Alcohol abuse Brother   . Alcohol abuse Paternal Uncle   . Alcohol abuse Paternal Grandfather   . Alcohol abuse Paternal Grandmother   . Allergic rhinitis Neg Hx   . Angioedema Neg Hx   . Asthma Neg Hx   . Atopy Neg Hx   . Eczema Neg Hx   . Immunodeficiency Neg Hx   . Urticaria Neg Hx     Past Surgical  History:  Procedure Laterality Date  . ABDOMINAL HYSTERECTOMY    . ARTHROSCOPY KNEE W/ DRILLING  06/2011   and Decemer of 2013.  [name] Carpel tunnel  1980's  . CATARACT EXTRACTION W/PHACO Right 03/21/2017   Procedure: CATARACT EXTRACTION PHACO AND INTRAOCULAR LENS PLACEMENT RIGHT EYE;  Surgeon: Baruch Goldmann, MD;  Location: [facility];  Service: Ophthalmology;  Laterality: Right;  CDE: 2.91   . CATARACT EXTRACTION W/PHACO Left 04/18/2017   Procedure: CATARACT EXTRACTION PHACO AND INTRAOCULAR LENS PLACEMENT LEFT EYE;  Surgeon: Baruch Goldmann, MD;  Location: [facility];  Service: Ophthalmology;  Laterality: Left;  left  . CHOLECYSTECTOMY    . COLONOSCOPY WITH PROPOFOL N/A 10/04/2016   Procedure: COLONOSCOPY WITH PROPOFOL;  Surgeon: Rogene Houston, MD;  Location: [facility];  Service: Endoscopy;  Laterality: N/A;  11:10  . CORONARY ARTERY BYPASS GRAFT N/A 08/29/2017   Procedure: CORONARY ARTERY BYPASS GRAFTING (CABG) x 3 WITH ENDOSCOPIC HARVESTING OF RIGHT SAPHENOUS VEIN;  Surgeon: Ivin Poot, MD;  Location: [facility];  Service: Open Heart Surgery;  Laterality: N/A;  . ESOPHAGOGASTRODUODENOSCOPY N/A 04/25/2016   Procedure: ESOPHAGOGASTRODUODENOSCOPY (EGD);  Surgeon: Rogene Houston, MD;  [facility];  Service: Endoscopy;  Laterality: N/A;  730  . LEFT HEART CATH AND CORONARY ANGIOGRAPHY N/A 08/27/2017   Procedure: LEFT HEART CATH AND CORONARY ANGIOGRAPHY;  Surgeon: Sherren Mocha, MD;  Location: [facility];  Service: Cardiovascular::  pLAD 95% - p-mLAD 50%, ostD1 90% -pD1 80%; ostOM1 90%; rPDA 80%. EF ~50-55% - HK of dital Anterolateral & Apical wall.  - Rec CVTS c/s  . RECTAL SURGERY     fissure  . SHOULDER SURGERY Left    arthroscopy in March of this year  . TEE WITHOUT CARDIOVERSION N/A 08/29/2017   Procedure: TRANSESOPHAGEAL ECHOCARDIOGRAM (TEE);  Surgeon: Prescott Gum, Collier Salina, MD;  Location: [facility];  Service: Open Heart Surgery;  Laterality: N/A;  . TRANSTHORACIC ECHOCARDIOGRAM   06/06/2017   Mild to moderate reduced EF 40  and 45%.  Anterior septal, inferoseptal and basal to mid inferior hypokinesis.  GR 1 DD.  No significant valvular lesion   Social History   Occupational History    Employer: [employer]  Tobacco Use  . Smoking status: Former Smoker    Types: Cigarettes    Quit date: 02/04/1971    Years since quitting: 49.1  . Smokeless tobacco: Never Used  . Tobacco comment: smoked 2 cigarettes a day  Vaping Use  . Vaping Use: Never used  Substance and Sexual Activity  . Alcohol use: No    Alcohol/week: 0.0 standard drinks  . Drug use: No  . Sexual activity: Never

## 2020-03-07 ENCOUNTER — Other Ambulatory Visit (INDEPENDENT_AMBULATORY_CARE_PROVIDER_SITE_OTHER): Payer: Self-pay | Admitting: *Deleted

## 2020-03-07 DIAGNOSIS — D649 Anemia, unspecified: Secondary | ICD-10-CM

## 2020-03-15 ENCOUNTER — Other Ambulatory Visit: Payer: Self-pay

## 2020-03-15 ENCOUNTER — Encounter: Payer: Self-pay | Admitting: Emergency Medicine

## 2020-03-15 ENCOUNTER — Ambulatory Visit
Admission: EM | Admit: 2020-03-15 | Discharge: 2020-03-15 | Disposition: A | Discharge status: Home / Self Care | Payer: Medicare Other | Attending: Emergency Medicine | Admitting: Emergency Medicine

## 2020-03-15 ENCOUNTER — Ambulatory Visit (INDEPENDENT_AMBULATORY_CARE_PROVIDER_SITE_OTHER): Payer: Medicare Other

## 2020-03-15 DIAGNOSIS — R053 Chronic cough: Secondary | ICD-10-CM

## 2020-03-15 DIAGNOSIS — R079 Chest pain, unspecified: Secondary | ICD-10-CM | POA: Diagnosis not present

## 2020-03-15 DIAGNOSIS — M546 Pain in thoracic spine: Secondary | ICD-10-CM | POA: Diagnosis not present

## 2020-03-15 MED ORDER — TIZANIDINE HCL 2 MG PO CAPS: 2.0000 mg | ORAL_CAPSULE | Freq: Three times a day (TID) | ORAL | 0 refills | Status: DC

## 2020-03-15 MED ORDER — MELOXICAM 7.5 MG PO TABS: 7.5000 mg | ORAL_TABLET | Freq: Every day | ORAL | 0 refills | Status: DC

## 2020-03-15 NOTE — ED Triage Notes (Signed)
On and off pain under RT shoulder blade since last night. Denies any injury.  Pt states she has had a dry cough x 2 years but has recently gotten more productive. Request chest xray.

## 2020-03-15 NOTE — Discharge Instructions (Addendum)
Continue conservative management of rest, ice, and gentle stretches Take naproxen as needed for pain relief (may cause abdominal discomfort, ulcers, and GI bleeds avoid taking with other NSAIDs) Take zanaflex at nighttime for symptomatic relief. Avoid driving or operating heavy machinery while using medication. DO NOT TAKE WITH OTHER MUSCLE RELAXER (BACLOFEN) Follow up with PCP if symptoms persist Return or go to the ER if you have any new or worsening symptoms (fever, chills, chest pain, abdominal pain, changes in bowel or bladder habits, pain radiating into lower legs, etc...)   Chest x-ray negative for cardiopulmonary disease Chronic cough may be secondary to allergies, asthma, or acid reflux.  Try switching up allergy medication Continue with OTC acid reflux medication Follow up with PCP for further evaluation and management

## 2020-03-15 NOTE — ED Provider Notes (Signed)
[place]   [id number] 03/15/20 Arrival Time: 8185  CC: RT shoulder pain; cough  SUBJECTIVE: History from: patient. KELCY LAIBLE is a 69 y.o. female complains of RT shoulder/ upper back pain x 1 day.  Denies a precipitating event or specific injury.  Localizes the pain to the inside of shoulder blade.  Describes the pain as intermittent and sharp in character.  Denies alleviating factors.  Denies aggravating factors.  Denies similar symptoms in the past.  Denies fever, chills, erythema, ecchymosis, effusion, weakness, numbness and tingling, saddle paresthesias, loss of bowel or bladder function.      Patient also reports hx of chronic cough x 2 years.  Requests chest x-ray.  States cough feels productive, but unable to produce anything.  Denies alleviating or aggravating factors.  Denies fever, chills, nausea, vomiting, rhinorrhea, congestion, sore throat.    ROS: As per HPI.  All other pertinent ROS negative.     Past Medical History:  Diagnosis Date   Anemia    Anxiety    Bipolar disorder ([place])    Bulging lumbar disc    L3-4   Chronic daily headache    Chronic low back pain 09/20/2014   COPD (chronic obstructive pulmonary disease) (HCC)    Degenerative arthritis    Depression    Diabetes mellitus, type II ([place])    Diabetic neuropathy ([place]) 09/16/2018   DM type 2 with diabetic peripheral neuropathy ([place]) 09/20/2014   Dyslipidemia    Dyspnea    Gastroparesis    GERD (gastroesophageal reflux disease)    Heart murmur    History of hiatal hernia    Hypothyroidism    IBS (irritable bowel syndrome)    Memory difficulty 09/20/2014   Morbid obesity ([place])    Neuropathy    Obstructive sleep apnea on CPAP    Restless legs syndrome (RLS) 09/17/2012   Stroke (cerebrum) ([place]) 05/16/2017   Left parietal   Urticaria    Past Surgical History:  Procedure Laterality Date   ABDOMINAL HYSTERECTOMY     ARTHROSCOPY KNEE W/ DRILLING  06/2011   and Decemer  of 2013.   Carpel tunnel  1980's   CATARACT EXTRACTION W/PHACO Right 03/21/2017   Procedure: CATARACT EXTRACTION PHACO AND INTRAOCULAR LENS PLACEMENT RIGHT EYE;  Surgeon: Baruch Goldmann, MD;  Location: [facility];  Service: Ophthalmology;  Laterality: Right;  CDE: 2.91    CATARACT EXTRACTION W/PHACO Left 04/18/2017   Procedure: CATARACT EXTRACTION PHACO AND INTRAOCULAR LENS PLACEMENT LEFT EYE;  Surgeon: Baruch Goldmann, MD;  Location: [facility];  Service: Ophthalmology;  Laterality: Left;  left   CHOLECYSTECTOMY     COLONOSCOPY WITH PROPOFOL N/A 10/04/2016   Procedure: COLONOSCOPY WITH PROPOFOL;  Surgeon: Rogene Houston, MD;  Location: [facility];  Service: Endoscopy;  Laterality: N/A;  11:10   CORONARY ARTERY BYPASS GRAFT N/A 08/29/2017   Procedure: CORONARY ARTERY BYPASS GRAFTING (CABG) x 3 WITH ENDOSCOPIC HARVESTING OF RIGHT SAPHENOUS VEIN;  Surgeon: Ivin Poot, MD;  Location: [facility];  Service: Open Heart Surgery;  Laterality: N/A;   ESOPHAGOGASTRODUODENOSCOPY N/A 04/25/2016   Procedure: ESOPHAGOGASTRODUODENOSCOPY (EGD);  Surgeon: Rogene Houston, MD;  [facility];  Service: Endoscopy;  Laterality: N/A;  730   LEFT HEART CATH AND CORONARY ANGIOGRAPHY N/A 08/27/2017   Procedure: LEFT HEART CATH AND CORONARY ANGIOGRAPHY;  Surgeon: Sherren Mocha, MD;  Location: [facility];  Service: Cardiovascular::  pLAD 95% - p-mLAD 50%, ostD1 90% -pD1 80%; ostOM1 90%; rPDA 80%.  EF ~50-55% - HK of dital Anterolateral & Apical wall.  - Rec CVTS c/s   RECTAL SURGERY     fissure   SHOULDER SURGERY Left    arthroscopy in March of this year   TEE WITHOUT CARDIOVERSION N/A 08/29/2017   Procedure: TRANSESOPHAGEAL ECHOCARDIOGRAM (TEE);  Surgeon: Prescott Gum, Collier Salina, MD;  Location: [facility];  Service: Open Heart Surgery;  Laterality: N/A;   TRANSTHORACIC ECHOCARDIOGRAM  06/06/2017   Mild to moderate reduced EF 40 and 45%.  Anterior septal, inferoseptal and basal to mid inferior hypokinesis.  GR  1 DD.  No significant valvular lesion   Allergies  Allergen Reactions   Amoxicillin Anaphylaxis and Other (See Comments)    Has patient had a PCN reaction causing immediate rash, facial/tongue/throat swelling, SOB or lightheadedness with hypotension: Yes Has patient had a PCN reaction causing severe rash involving mucus membranes or skin necrosis: Yes Has patient had a PCN reaction that required hospitalization: Yes Has patient had a PCN reaction occurring within the last 10 years: Yes If all of the above answers are "NO", then may proceed with Cephalosporin use.    Hydrocodone Anaphylaxis   Percocet [Oxycodone-Acetaminophen] Other (See Comments)    Causes chest pain /tightness. Pressure pain around her ribs.   Vancomycin Itching   Depacon [Valproic Acid] Other (See Comments)    Causes falls    Iron Nausea And Vomiting   No current facility-administered medications on file prior to encounter.   Current Outpatient Medications on File Prior to Encounter  Medication Sig Dispense Refill   ALPRAZolam (XANAX) 0.25 MG tablet Take 1 tablet (0.25 mg total) by mouth 2 (two) times daily as needed for anxiety. 60 tablet 2   aspirin EC 81 MG tablet Take 1 tablet (81 mg total) by mouth daily. 90 tablet 3   baclofen (LIORESAL) 10 MG tablet TAKE ONE TABLET BY MOUTH AT BEDTIME. 30 tablet 6   buPROPion (WELLBUTRIN XL) 300 MG 24 hr tablet Take 1 tablet (300 mg total) by mouth every morning. 30 tablet 2   calcium carbonate (TUMS - DOSED IN MG ELEMENTAL CALCIUM) 500 MG chewable tablet Chew 2 tablets by mouth as needed for indigestion or heartburn.      carvedilol (COREG) 6.25 MG tablet TAKE ONE TABLET BY MOUTH TWICE DAILY (MORNING ,EVENING) 180 tablet 2   cetirizine (ZYRTEC) 10 MG tablet Take 1 tablet tablet 1-2 times daily. (Patient taking differently: Take 10 mg by mouth daily. Take 1 tablet tablet 1-2 times daily.) 60 tablet 5   diclofenac Sodium (VOLTAREN) 1 % GEL Apply topically 4 (four)  times daily.     diphenhydrAMINE (BENADRYL) 25 mg capsule Take 25 mg by mouth as needed.     EPINEPHrine (EPIPEN 2-PAK) 0.3 mg/0.3 mL IJ SOAJ injection Inject 0.3 mg into the muscle once.     esomeprazole (NEXIUM) 40 MG capsule Take 40 mg by mouth daily before supper.   12   Evolocumab (REPATHA SURECLICK) 465 MG/ML SOAJ Inject 1 Dose into the skin every 14 (fourteen) days. 2 pen 11   famotidine (PEPCID) 20 MG tablet Take 1 tablet (20 mg total) by mouth daily. (Patient taking differently: Take 20 mg by mouth at bedtime. ) 30 tablet 5   gabapentin (NEURONTIN) 400 MG capsule Take 1 capsule (400 mg total) by mouth 3 (three) times daily. 270 capsule 1   glipiZIDE (GLUCOTROL XL) 5 MG 24 hr tablet TAKE ONE TABLET BY MOUTH DAILY WITH BREAKFAST 90 tablet 0   lamoTRIgine (  LAMICTAL) 150 MG tablet Take 1 tablet (150 mg total) by mouth at bedtime. 30 tablet 2   levothyroxine (SYNTHROID, LEVOTHROID) 100 MCG tablet Take 100 mcg by mouth daily before breakfast.      losartan (COZAAR) 25 MG tablet TAKE ONE TABLET BY MOUTH DAILY (MORNING) 90 tablet 2   meclizine (ANTIVERT) 25 MG tablet Take 1 tablet (25 mg total) by mouth 3 (three) times daily as needed for dizziness. (Patient taking differently: Take 25 mg by mouth daily as needed for dizziness. ) 30 tablet 0   metFORMIN (GLUCOPHAGE) 500 MG tablet TAKE ONE TABLET BY MOUTH TWICE DAILY WITH A MEAL (MORNING ,EVENING) 180 tablet 0   montelukast (SINGULAIR) 10 MG tablet Take 10 mg by mouth daily as needed.      ondansetron (ZOFRAN) 4 MG tablet Take 1 tablet (4 mg total) by mouth 2 (two) times daily as needed for nausea or vomiting. 30 tablet 1   rOPINIRole (REQUIP) 3 MG tablet TAKE ONE TABLET BY MOUTH EVERY MORNING, ,NOON AND AT BEDTIME. (PRT PT MORNING,EVENING,BEDTIME) 270 tablet 1   traMADol (ULTRAM) 50 MG tablet Take 100 mg by mouth 4 (four) times daily as needed.      traZODone (DESYREL) 150 MG tablet Take 1 tablet (150 mg total) by mouth at bedtime.  30 tablet 2   venlafaxine XR (EFFEXOR-XR) 150 MG 24 hr capsule Take 2 capsules (300 mg total) by mouth in the morning. 60 capsule 2   Social History   Socioeconomic History   Marital status: Single    Spouse name: Not on file   Number of children: 0   Years of education: 16   Highest education level: Not on file  Occupational History    Employer: [employer]  Tobacco Use   Smoking status: Former Smoker    Types: Cigarettes    Quit date: 02/04/1971    Years since quitting: 49.1   Smokeless tobacco: Never Used   Tobacco comment: smoked 2 cigarettes a day  Vaping Use   Vaping Use: Never used  Substance and Sexual Activity   Alcohol use: No    Alcohol/week: 0.0 standard drinks   Drug use: No   Sexual activity: Never  Other Topics Concern   Not on file  Social History Narrative   Patient lives at home alone.    Patient has no children.    Patient has her masters in nursing.    Patient is single.    Patient drinks about 2 glasses of tea daily.   Patient is right handed.   Social Determinants of Health   Financial Resource Strain:    Difficulty of Paying Living Expenses: Not on file  Food Insecurity:    Worried About Charity fundraiser in the Last Year: Not on file   [employer] of Food in the Last Year: Not on file  Transportation Needs:    Lack of Transportation (Medical): Not on file   Lack of Transportation (Non-Medical): Not on file  Physical Activity:    Days of Exercise per Week: Not on file   Minutes of Exercise per Session: Not on file  Stress:    Feeling of Stress : Not on file  Social Connections:    Frequency of Communication with Friends and Family: Not on file   Frequency of Social Gatherings with Friends and Family: Not on file   Attends Religious Services: Not on file   Active Member of Clubs or Organizations: Not on file  Attends Music therapist: Not on file   Marital Status: Not on file  Intimate  Partner Violence:    Fear of Current or Ex-Partner: Not on file   Emotionally Abused: Not on file   Physically Abused: Not on file   Sexually Abused: Not on file   Family History  Problem Relation Age of Onset   Hypertension Mother    Lymphoma Mother    Depression Mother    Arthritis Father    Alcohol abuse Father    Hypertension Sister    Cancer Brother        kidney and lung   Alcohol abuse Brother    Alcohol abuse Paternal Uncle    Alcohol abuse Paternal Grandfather    Alcohol abuse Paternal Grandmother    Allergic rhinitis Neg Hx    Angioedema Neg Hx    Asthma Neg Hx    Atopy Neg Hx    Eczema Neg Hx    Immunodeficiency Neg Hx    Urticaria Neg Hx     OBJECTIVE:  Vitals:   03/15/20 1508 03/15/20 1509 03/15/20 1512  BP:  100/64   Pulse:  78   Resp:  18   Temp:  98.3 F (36.8 C)   TempSrc:  Oral   SpO2:  91% 94%  Weight: 235 lb (106.6 kg)    Height: 5' (1.524 m)      General appearance: ALERT; in no acute distress.  Head: NCAT Lungs: Normal respiratory effort CV: RRR; CTAB Musculoskeletal: Back Inspection: Skin warm, dry, clear and intact without obvious erythema, effusion, or ecchymosis.  Palpation: mildly TTP over medial border of scapula Strength: 5/5 shld abduction, 5/5 shld adduction, 5/5 elbow flexion, 5/5 elbow extension, 5/5 grip strength, 5/5 hip flexion, 5/5 hip extension Skin: warm and dry Neurologic: Ambulates without difficulty; Sensation intact about the upper/ lower extremities Psychological: alert and cooperative; normal mood and affect  DIAGNOSTIC STUDIES:  DG Chest 2 View  Result Date: 03/15/2020 CLINICAL DATA:  Intermittent right chest pain beginning last night. EXAM: CHEST - 2 VIEW COMPARISON:  PA and lateral chest 05/06/2018. FINDINGS: Lungs clear. Heart size normal. The patient is status post CABG. No pneumothorax or pleural fluid. No acute or focal bony abnormality. IMPRESSION: No acute disease. Electronically  Signed   By: Inge Rise M.D.   On: 03/15/2020 15:50    I have reviewed the x-rays myself and the radiologist interpretation. I am in agreement with the radiologist interpretation.     ASSESSMENT & PLAN:  1. Acute right-sided thoracic back pain   2. Chronic cough     Meds ordered this encounter  Medications   meloxicam (MOBIC) 7.5 MG tablet    Sig: Take 1 tablet (7.5 mg total) by mouth daily.    Dispense:  20 tablet    Refill:  0    Order Specific Question:   Supervising Provider    Answer:   Raylene Everts [[id number]]   tizanidine (ZANAFLEX) 2 MG capsule    Sig: Take 1 capsule (2 mg total) by mouth 3 (three) times daily.    Dispense:  30 capsule    Refill:  0    Order Specific Question:   Supervising Provider    Answer:   Raylene Everts [[id number]]    Continue conservative management of rest, ice, and gentle stretches Take naproxen as needed for pain relief (may cause abdominal discomfort, ulcers, and GI bleeds avoid taking with other NSAIDs) Take zanaflex at nighttime for  symptomatic relief. Avoid driving or operating heavy machinery while using medication. Follow up with PCP if symptoms persist Return or go to the ER if you have any new or worsening symptoms (fever, chills, chest pain, abdominal pain, changes in bowel or bladder habits, pain radiating into lower legs, etc...)   Chest x-ray negative for cardiopulmonary disease Chronic cough may be secondary to allergies, asthma, or acid reflux.  Try switching up allergy medication Continue with OTC acid reflux medication Follow up with PCP for further evaluation and management  Reviewed expectations re: course of current medical issues. Questions answered. Outlined signs and symptoms indicating need for more acute intervention. Patient verbalized understanding. After Visit Summary given.    Lestine Box, PA-C 03/15/20 1559

## 2020-03-17 DIAGNOSIS — Z23 Encounter for immunization: Secondary | ICD-10-CM | POA: Diagnosis not present

## 2020-03-27 ENCOUNTER — Other Ambulatory Visit: Payer: Self-pay | Admitting: Neurology

## 2020-03-27 DIAGNOSIS — F5101 Primary insomnia: Secondary | ICD-10-CM | POA: Diagnosis not present

## 2020-03-27 DIAGNOSIS — J410 Simple chronic bronchitis: Secondary | ICD-10-CM | POA: Diagnosis not present

## 2020-03-27 DIAGNOSIS — N1832 Chronic kidney disease, stage 3b: Secondary | ICD-10-CM | POA: Diagnosis not present

## 2020-03-27 DIAGNOSIS — R296 Repeated falls: Secondary | ICD-10-CM | POA: Diagnosis not present

## 2020-03-27 DIAGNOSIS — L501 Idiopathic urticaria: Secondary | ICD-10-CM | POA: Diagnosis not present

## 2020-03-27 DIAGNOSIS — Z0189 Encounter for other specified special examinations: Secondary | ICD-10-CM | POA: Diagnosis not present

## 2020-03-27 DIAGNOSIS — I5022 Chronic systolic (congestive) heart failure: Secondary | ICD-10-CM | POA: Diagnosis not present

## 2020-03-27 DIAGNOSIS — G2581 Restless legs syndrome: Secondary | ICD-10-CM | POA: Diagnosis not present

## 2020-03-27 DIAGNOSIS — E1121 Type 2 diabetes mellitus with diabetic nephropathy: Secondary | ICD-10-CM | POA: Diagnosis not present

## 2020-03-27 DIAGNOSIS — E782 Mixed hyperlipidemia: Secondary | ICD-10-CM | POA: Diagnosis not present

## 2020-03-27 DIAGNOSIS — F3181 Bipolar II disorder: Secondary | ICD-10-CM | POA: Diagnosis not present

## 2020-03-27 DIAGNOSIS — E1122 Type 2 diabetes mellitus with diabetic chronic kidney disease: Secondary | ICD-10-CM | POA: Diagnosis not present

## 2020-04-01 DIAGNOSIS — E782 Mixed hyperlipidemia: Secondary | ICD-10-CM | POA: Diagnosis not present

## 2020-04-01 DIAGNOSIS — F3181 Bipolar II disorder: Secondary | ICD-10-CM | POA: Diagnosis not present

## 2020-04-01 DIAGNOSIS — L501 Idiopathic urticaria: Secondary | ICD-10-CM | POA: Diagnosis not present

## 2020-04-01 DIAGNOSIS — I5022 Chronic systolic (congestive) heart failure: Secondary | ICD-10-CM | POA: Diagnosis not present

## 2020-04-01 DIAGNOSIS — F5101 Primary insomnia: Secondary | ICD-10-CM | POA: Diagnosis not present

## 2020-04-01 DIAGNOSIS — J410 Simple chronic bronchitis: Secondary | ICD-10-CM | POA: Diagnosis not present

## 2020-04-01 DIAGNOSIS — M7122 Synovial cyst of popliteal space [Baker], left knee: Secondary | ICD-10-CM | POA: Diagnosis not present

## 2020-04-01 DIAGNOSIS — G2581 Restless legs syndrome: Secondary | ICD-10-CM | POA: Diagnosis not present

## 2020-04-01 DIAGNOSIS — E1122 Type 2 diabetes mellitus with diabetic chronic kidney disease: Secondary | ICD-10-CM | POA: Diagnosis not present

## 2020-04-01 DIAGNOSIS — N1832 Chronic kidney disease, stage 3b: Secondary | ICD-10-CM | POA: Diagnosis not present

## 2020-04-01 DIAGNOSIS — D649 Anemia, unspecified: Secondary | ICD-10-CM | POA: Diagnosis not present

## 2020-04-01 DIAGNOSIS — Z8673 Personal history of transient ischemic attack (TIA), and cerebral infarction without residual deficits: Secondary | ICD-10-CM | POA: Diagnosis not present

## 2020-04-08 ENCOUNTER — Encounter (HOSPITAL_COMMUNITY): Payer: Self-pay | Admitting: *Deleted

## 2020-04-08 ENCOUNTER — Other Ambulatory Visit: Payer: Self-pay

## 2020-04-08 DIAGNOSIS — N1832 Chronic kidney disease, stage 3b: Secondary | ICD-10-CM | POA: Diagnosis present

## 2020-04-08 DIAGNOSIS — E1142 Type 2 diabetes mellitus with diabetic polyneuropathy: Secondary | ICD-10-CM | POA: Diagnosis present

## 2020-04-08 DIAGNOSIS — E039 Hypothyroidism, unspecified: Secondary | ICD-10-CM | POA: Diagnosis present

## 2020-04-08 DIAGNOSIS — S61251A Open bite of left index finger without damage to nail, initial encounter: Secondary | ICD-10-CM | POA: Diagnosis not present

## 2020-04-08 DIAGNOSIS — G2581 Restless legs syndrome: Secondary | ICD-10-CM | POA: Diagnosis present

## 2020-04-08 DIAGNOSIS — Z5329 Procedure and treatment not carried out because of patient's decision for other reasons: Secondary | ICD-10-CM | POA: Diagnosis not present

## 2020-04-08 DIAGNOSIS — Y92003 Bedroom of unspecified non-institutional (private) residence as the place of occurrence of the external cause: Secondary | ICD-10-CM

## 2020-04-08 DIAGNOSIS — K219 Gastro-esophageal reflux disease without esophagitis: Secondary | ICD-10-CM | POA: Diagnosis present

## 2020-04-08 DIAGNOSIS — F319 Bipolar disorder, unspecified: Secondary | ICD-10-CM | POA: Diagnosis present

## 2020-04-08 DIAGNOSIS — W540XXA Bitten by dog, initial encounter: Secondary | ICD-10-CM

## 2020-04-08 DIAGNOSIS — Z79899 Other long term (current) drug therapy: Secondary | ICD-10-CM

## 2020-04-08 DIAGNOSIS — F419 Anxiety disorder, unspecified: Secondary | ICD-10-CM | POA: Diagnosis present

## 2020-04-08 DIAGNOSIS — S61452A Open bite of left hand, initial encounter: Secondary | ICD-10-CM | POA: Diagnosis not present

## 2020-04-08 DIAGNOSIS — D631 Anemia in chronic kidney disease: Secondary | ICD-10-CM | POA: Diagnosis present

## 2020-04-08 DIAGNOSIS — Z87891 Personal history of nicotine dependence: Secondary | ICD-10-CM

## 2020-04-08 DIAGNOSIS — Z79890 Hormone replacement therapy (postmenopausal): Secondary | ICD-10-CM

## 2020-04-08 DIAGNOSIS — E785 Hyperlipidemia, unspecified: Secondary | ICD-10-CM | POA: Diagnosis present

## 2020-04-08 DIAGNOSIS — Z90710 Acquired absence of both cervix and uterus: Secondary | ICD-10-CM

## 2020-04-08 DIAGNOSIS — Z20822 Contact with and (suspected) exposure to covid-19: Secondary | ICD-10-CM | POA: Diagnosis present

## 2020-04-08 DIAGNOSIS — S62611B Displaced fracture of proximal phalanx of left index finger, initial encounter for open fracture: Principal | ICD-10-CM | POA: Diagnosis present

## 2020-04-08 DIAGNOSIS — E1143 Type 2 diabetes mellitus with diabetic autonomic (poly)neuropathy: Secondary | ICD-10-CM | POA: Diagnosis present

## 2020-04-08 DIAGNOSIS — Z8673 Personal history of transient ischemic attack (TIA), and cerebral infarction without residual deficits: Secondary | ICD-10-CM | POA: Diagnosis not present

## 2020-04-08 DIAGNOSIS — E1151 Type 2 diabetes mellitus with diabetic peripheral angiopathy without gangrene: Secondary | ICD-10-CM | POA: Diagnosis present

## 2020-04-08 DIAGNOSIS — S62611A Displaced fracture of proximal phalanx of left index finger, initial encounter for closed fracture: Secondary | ICD-10-CM | POA: Diagnosis not present

## 2020-04-08 DIAGNOSIS — G8929 Other chronic pain: Secondary | ICD-10-CM | POA: Diagnosis present

## 2020-04-08 DIAGNOSIS — G4733 Obstructive sleep apnea (adult) (pediatric): Secondary | ICD-10-CM | POA: Diagnosis present

## 2020-04-08 DIAGNOSIS — Z880 Allergy status to penicillin: Secondary | ICD-10-CM

## 2020-04-08 DIAGNOSIS — E1122 Type 2 diabetes mellitus with diabetic chronic kidney disease: Secondary | ICD-10-CM | POA: Diagnosis present

## 2020-04-08 DIAGNOSIS — E119 Type 2 diabetes mellitus without complications: Secondary | ICD-10-CM | POA: Diagnosis not present

## 2020-04-08 DIAGNOSIS — J449 Chronic obstructive pulmonary disease, unspecified: Secondary | ICD-10-CM | POA: Diagnosis not present

## 2020-04-08 DIAGNOSIS — Z961 Presence of intraocular lens: Secondary | ICD-10-CM | POA: Diagnosis present

## 2020-04-08 DIAGNOSIS — Z9049 Acquired absence of other specified parts of digestive tract: Secondary | ICD-10-CM

## 2020-04-08 DIAGNOSIS — Z951 Presence of aortocoronary bypass graft: Secondary | ICD-10-CM

## 2020-04-08 DIAGNOSIS — I129 Hypertensive chronic kidney disease with stage 1 through stage 4 chronic kidney disease, or unspecified chronic kidney disease: Secondary | ICD-10-CM | POA: Diagnosis present

## 2020-04-08 DIAGNOSIS — Z9841 Cataract extraction status, right eye: Secondary | ICD-10-CM

## 2020-04-08 DIAGNOSIS — Z888 Allergy status to other drugs, medicaments and biological substances status: Secondary | ICD-10-CM

## 2020-04-08 DIAGNOSIS — S62611D Displaced fracture of proximal phalanx of left index finger, subsequent encounter for fracture with routine healing: Secondary | ICD-10-CM | POA: Diagnosis not present

## 2020-04-08 DIAGNOSIS — Z7984 Long term (current) use of oral hypoglycemic drugs: Secondary | ICD-10-CM

## 2020-04-08 DIAGNOSIS — S62623D Displaced fracture of medial phalanx of left middle finger, subsequent encounter for fracture with routine healing: Secondary | ICD-10-CM | POA: Diagnosis not present

## 2020-04-08 DIAGNOSIS — Z7982 Long term (current) use of aspirin: Secondary | ICD-10-CM | POA: Diagnosis not present

## 2020-04-08 DIAGNOSIS — I10 Essential (primary) hypertension: Secondary | ICD-10-CM | POA: Diagnosis not present

## 2020-04-08 DIAGNOSIS — I2510 Atherosclerotic heart disease of native coronary artery without angina pectoris: Secondary | ICD-10-CM | POA: Diagnosis present

## 2020-04-08 DIAGNOSIS — S62621B Displaced fracture of medial phalanx of left index finger, initial encounter for open fracture: Secondary | ICD-10-CM | POA: Diagnosis not present

## 2020-04-08 DIAGNOSIS — L03114 Cellulitis of left upper limb: Secondary | ICD-10-CM | POA: Diagnosis not present

## 2020-04-08 DIAGNOSIS — Z6841 Body Mass Index (BMI) 40.0 and over, adult: Secondary | ICD-10-CM | POA: Diagnosis not present

## 2020-04-08 DIAGNOSIS — Z885 Allergy status to narcotic agent status: Secondary | ICD-10-CM

## 2020-04-08 DIAGNOSIS — Z23 Encounter for immunization: Secondary | ICD-10-CM | POA: Diagnosis not present

## 2020-04-08 DIAGNOSIS — Z9842 Cataract extraction status, left eye: Secondary | ICD-10-CM

## 2020-04-08 NOTE — ED Triage Notes (Signed)
Tetanus shot updated today per pt at [facility]   Pt states antibiotics were called in to pharmacy in [place] but pt has not picked up prescription.

## 2020-04-08 NOTE — ED Triage Notes (Signed)
Pt's dog bit her left index.  Pt seen at [facility] and did not have orthopedics, pt came here instead.  Dog not up to date on shots.   Pt states that she has talked to Event organiser.

## 2020-04-09 ENCOUNTER — Inpatient Hospital Stay (HOSPITAL_COMMUNITY)
Admission: EM | Admit: 2020-04-09 | Discharge: 2020-04-11 | DRG: 513 | Disposition: A | Discharge status: Home / Self Care | Payer: Medicare Other | Attending: Family Medicine | Admitting: Family Medicine

## 2020-04-09 ENCOUNTER — Emergency Department (HOSPITAL_COMMUNITY): Payer: Medicare Other

## 2020-04-09 DIAGNOSIS — E785 Hyperlipidemia, unspecified: Secondary | ICD-10-CM | POA: Diagnosis present

## 2020-04-09 DIAGNOSIS — Y92003 Bedroom of unspecified non-institutional (private) residence as the place of occurrence of the external cause: Secondary | ICD-10-CM | POA: Diagnosis not present

## 2020-04-09 DIAGNOSIS — D631 Anemia in chronic kidney disease: Secondary | ICD-10-CM | POA: Diagnosis present

## 2020-04-09 DIAGNOSIS — E119 Type 2 diabetes mellitus without complications: Secondary | ICD-10-CM | POA: Diagnosis present

## 2020-04-09 DIAGNOSIS — E1143 Type 2 diabetes mellitus with diabetic autonomic (poly)neuropathy: Secondary | ICD-10-CM | POA: Diagnosis present

## 2020-04-09 DIAGNOSIS — Z6841 Body Mass Index (BMI) 40.0 and over, adult: Secondary | ICD-10-CM | POA: Diagnosis not present

## 2020-04-09 DIAGNOSIS — F319 Bipolar disorder, unspecified: Secondary | ICD-10-CM | POA: Diagnosis present

## 2020-04-09 DIAGNOSIS — G8929 Other chronic pain: Secondary | ICD-10-CM | POA: Diagnosis present

## 2020-04-09 DIAGNOSIS — S62611A Displaced fracture of proximal phalanx of left index finger, initial encounter for closed fracture: Secondary | ICD-10-CM | POA: Diagnosis not present

## 2020-04-09 DIAGNOSIS — G4733 Obstructive sleep apnea (adult) (pediatric): Secondary | ICD-10-CM | POA: Diagnosis present

## 2020-04-09 DIAGNOSIS — S61452A Open bite of left hand, initial encounter: Secondary | ICD-10-CM | POA: Diagnosis present

## 2020-04-09 DIAGNOSIS — Z20822 Contact with and (suspected) exposure to covid-19: Secondary | ICD-10-CM | POA: Diagnosis present

## 2020-04-09 DIAGNOSIS — S62611B Displaced fracture of proximal phalanx of left index finger, initial encounter for open fracture: Secondary | ICD-10-CM | POA: Diagnosis present

## 2020-04-09 DIAGNOSIS — Z79899 Other long term (current) drug therapy: Secondary | ICD-10-CM | POA: Diagnosis not present

## 2020-04-09 DIAGNOSIS — E1122 Type 2 diabetes mellitus with diabetic chronic kidney disease: Secondary | ICD-10-CM | POA: Diagnosis present

## 2020-04-09 DIAGNOSIS — I10 Essential (primary) hypertension: Secondary | ICD-10-CM | POA: Diagnosis not present

## 2020-04-09 DIAGNOSIS — S62621D Displaced fracture of medial phalanx of left index finger, subsequent encounter for fracture with routine healing: Secondary | ICD-10-CM

## 2020-04-09 DIAGNOSIS — W540XXA Bitten by dog, initial encounter: Secondary | ICD-10-CM | POA: Diagnosis present

## 2020-04-09 DIAGNOSIS — Z8673 Personal history of transient ischemic attack (TIA), and cerebral infarction without residual deficits: Secondary | ICD-10-CM | POA: Diagnosis not present

## 2020-04-09 DIAGNOSIS — L03114 Cellulitis of left upper limb: Secondary | ICD-10-CM

## 2020-04-09 DIAGNOSIS — F419 Anxiety disorder, unspecified: Secondary | ICD-10-CM | POA: Diagnosis present

## 2020-04-09 DIAGNOSIS — K219 Gastro-esophageal reflux disease without esophagitis: Secondary | ICD-10-CM | POA: Diagnosis present

## 2020-04-09 DIAGNOSIS — T148XXA Other injury of unspecified body region, initial encounter: Secondary | ICD-10-CM

## 2020-04-09 DIAGNOSIS — E1121 Type 2 diabetes mellitus with diabetic nephropathy: Secondary | ICD-10-CM | POA: Diagnosis present

## 2020-04-09 DIAGNOSIS — E1169 Type 2 diabetes mellitus with other specified complication: Secondary | ICD-10-CM | POA: Diagnosis present

## 2020-04-09 DIAGNOSIS — E039 Hypothyroidism, unspecified: Secondary | ICD-10-CM | POA: Diagnosis present

## 2020-04-09 DIAGNOSIS — S62611D Displaced fracture of proximal phalanx of left index finger, subsequent encounter for fracture with routine healing: Secondary | ICD-10-CM | POA: Diagnosis not present

## 2020-04-09 DIAGNOSIS — Z880 Allergy status to penicillin: Secondary | ICD-10-CM | POA: Diagnosis not present

## 2020-04-09 DIAGNOSIS — Z7982 Long term (current) use of aspirin: Secondary | ICD-10-CM | POA: Diagnosis not present

## 2020-04-09 DIAGNOSIS — J449 Chronic obstructive pulmonary disease, unspecified: Secondary | ICD-10-CM | POA: Diagnosis present

## 2020-04-09 DIAGNOSIS — G2581 Restless legs syndrome: Secondary | ICD-10-CM | POA: Diagnosis present

## 2020-04-09 DIAGNOSIS — Z79890 Hormone replacement therapy (postmenopausal): Secondary | ICD-10-CM | POA: Diagnosis not present

## 2020-04-09 DIAGNOSIS — N1832 Chronic kidney disease, stage 3b: Secondary | ICD-10-CM | POA: Diagnosis present

## 2020-04-09 DIAGNOSIS — E1142 Type 2 diabetes mellitus with diabetic polyneuropathy: Secondary | ICD-10-CM | POA: Diagnosis present

## 2020-04-09 LAB — BASIC METABOLIC PANEL
Anion gap: 12 (ref 5–15)
BUN: 24 mg/dL — ABNORMAL HIGH (ref 8–23)
CO2: 28 mmol/L (ref 22–32)
Calcium: 8.9 mg/dL (ref 8.9–10.3)
Chloride: 95 mmol/L — ABNORMAL LOW (ref 98–111)
Creatinine, Ser: 1.4 mg/dL — ABNORMAL HIGH (ref 0.44–1.00)
GFR, Estimated: 41 mL/min — ABNORMAL LOW (ref >60)
Glucose, Bld: 158 mg/dL — ABNORMAL HIGH (ref 70–99)
Potassium: 3.6 mmol/L (ref 3.5–5.1)
Sodium: 135 mmol/L (ref 135–145)

## 2020-04-09 LAB — HEPATIC FUNCTION PANEL
ALT: 14 U/L (ref 0–44)
AST: 19 U/L (ref 15–41)
Albumin: 3.7 g/dL (ref 3.5–5.0)
Alkaline Phosphatase: 58 U/L (ref 38–126)
Bilirubin, Direct: 0.1 mg/dL (ref 0.0–0.2)
Indirect Bilirubin: 0.2 mg/dL — ABNORMAL LOW (ref 0.3–0.9)
Total Bilirubin: 0.3 mg/dL (ref 0.3–1.2)
Total Protein: 7.6 g/dL (ref 6.5–8.1)

## 2020-04-09 LAB — CBC WITH DIFFERENTIAL/PLATELET
Abs Immature Granulocytes: 0.05 10*3/uL (ref 0.00–0.07)
Basophils Absolute: 0.1 10*3/uL (ref 0.0–0.1)
Basophils Relative: 0 %
Eosinophils Absolute: 0.6 10*3/uL — ABNORMAL HIGH (ref 0.0–0.5)
Eosinophils Relative: 4 %
HCT: 35.7 % — ABNORMAL LOW (ref 36.0–46.0)
Hemoglobin: 10.9 g/dL — ABNORMAL LOW (ref 12.0–15.0)
Immature Granulocytes: 0 %
Lymphocytes Relative: 16 %
Lymphs Abs: 2.4 10*3/uL (ref 0.7–4.0)
MCH: 28.9 pg (ref 26.0–34.0)
MCHC: 30.5 g/dL (ref 30.0–36.0)
MCV: 94.7 fL (ref 80.0–100.0)
Monocytes Absolute: 1.4 10*3/uL — ABNORMAL HIGH (ref 0.1–1.0)
Monocytes Relative: 9 %
Neutro Abs: 10.6 10*3/uL — ABNORMAL HIGH (ref 1.7–7.7)
Neutrophils Relative %: 71 %
Platelets: 400 10*3/uL (ref 150–400)
RBC: 3.77 MIL/uL — ABNORMAL LOW (ref 3.87–5.11)
RDW: 15.2 % (ref 11.5–15.5)
WBC: 15.1 10*3/uL — ABNORMAL HIGH (ref 4.0–10.5)
nRBC: 0 % (ref 0.0–0.2)

## 2020-04-09 LAB — GLUCOSE, CAPILLARY: Glucose-Capillary: 120 mg/dL — ABNORMAL HIGH (ref 70–99)

## 2020-04-09 LAB — RESP PANEL BY RT-PCR (RSV, FLU A&B, COVID)  RVPGX2
Influenza A by PCR: NEGATIVE
Influenza B by PCR: NEGATIVE
Resp Syncytial Virus by PCR: NEGATIVE
SARS Coronavirus 2 by RT PCR: NEGATIVE

## 2020-04-09 LAB — CBG MONITORING, ED
Glucose-Capillary: 147 mg/dL — ABNORMAL HIGH (ref 70–99)
Glucose-Capillary: 145 mg/dL — ABNORMAL HIGH (ref 70–99)
Glucose-Capillary: 109 mg/dL — ABNORMAL HIGH (ref 70–99)

## 2020-04-09 MED ORDER — GABAPENTIN 400 MG PO CAPS
400.0000 mg | ORAL_CAPSULE | Freq: Three times a day (TID) | ORAL | Status: DC
  Administered 2020-04-09 – 2020-04-11 (×6): 400 mg via ORAL
  Filled 2020-04-09 (×6): qty 1

## 2020-04-09 MED ORDER — KETOROLAC TROMETHAMINE 15 MG/ML IJ SOLN
15.0000 mg | Freq: Once | INTRAMUSCULAR | Status: AC
  Administered 2020-04-09: 07:00:00 15 mg via INTRAVENOUS
  Filled 2020-04-09: qty 1

## 2020-04-09 MED ORDER — ENOXAPARIN SODIUM 40 MG/0.4ML ~~LOC~~ SOLN: 40.0000 mg | SUBCUTANEOUS | Status: DC

## 2020-04-09 MED ORDER — FENTANYL CITRATE (PF) 100 MCG/2ML IJ SOLN
50.0000 ug | Freq: Once | INTRAMUSCULAR | Status: AC
  Administered 2020-04-09: 04:00:00 50 ug via INTRAVENOUS
  Filled 2020-04-09: qty 2

## 2020-04-09 MED ORDER — LEVOTHYROXINE SODIUM 100 MCG PO TABS
100.0000 ug | ORAL_TABLET | Freq: Every day | ORAL | Status: DC
  Administered 2020-04-09 – 2020-04-11 (×3): 100 ug via ORAL
  Filled 2020-04-09: qty 1
  Filled 2020-04-09: qty 2
  Filled 2020-04-09: qty 1

## 2020-04-09 MED ORDER — ONDANSETRON HCL 4 MG PO TABS: 4.0000 mg | ORAL_TABLET | Freq: Four times a day (QID) | ORAL | Status: DC | PRN

## 2020-04-09 MED ORDER — SODIUM CHLORIDE 0.9 % IV SOLN
2.0000 g | INTRAVENOUS | Status: DC
  Administered 2020-04-10 – 2020-04-11 (×2): 2 g via INTRAVENOUS
  Filled 2020-04-09 (×2): qty 20

## 2020-04-09 MED ORDER — BUPROPION HCL ER (XL) 300 MG PO TB24
300.0000 mg | ORAL_TABLET | Freq: Every morning | ORAL | Status: DC
  Administered 2020-04-09 – 2020-04-11 (×3): 300 mg via ORAL
  Filled 2020-04-09: qty 1
  Filled 2020-04-09: qty 2
  Filled 2020-04-09: qty 1

## 2020-04-09 MED ORDER — PIPERACILLIN-TAZOBACTAM 3.375 G IVPB 30 MIN
3.3750 g | Freq: Once | INTRAVENOUS | Status: DC
  Administered 2020-04-09: 04:00:00 3.375 g via INTRAVENOUS
  Filled 2020-04-09: qty 50

## 2020-04-09 MED ORDER — LOSARTAN POTASSIUM 50 MG PO TABS
25.0000 mg | ORAL_TABLET | Freq: Every day | ORAL | Status: DC
  Administered 2020-04-10 – 2020-04-11 (×2): 25 mg via ORAL
  Filled 2020-04-09 (×3): qty 1

## 2020-04-09 MED ORDER — CLINDAMYCIN PHOSPHATE 600 MG/50ML IV SOLN
600.0000 mg | Freq: Once | INTRAVENOUS | Status: AC
  Administered 2020-04-09: 05:00:00 600 mg via INTRAVENOUS
  Filled 2020-04-09: qty 50

## 2020-04-09 MED ORDER — ONDANSETRON HCL 4 MG/2ML IJ SOLN: 4.0000 mg | Freq: Four times a day (QID) | INTRAMUSCULAR | Status: DC | PRN

## 2020-04-09 MED ORDER — FAMOTIDINE 20 MG PO TABS
20.0000 mg | ORAL_TABLET | Freq: Every day | ORAL | Status: DC
  Administered 2020-04-09 – 2020-04-10 (×2): 20 mg via ORAL
  Filled 2020-04-09 (×2): qty 1

## 2020-04-09 MED ORDER — FENTANYL CITRATE (PF) 100 MCG/2ML IJ SOLN
100.0000 ug | INTRAMUSCULAR | Status: DC | PRN
  Administered 2020-04-09 – 2020-04-10 (×4): 100 ug via INTRAVENOUS
  Filled 2020-04-09 (×4): qty 2

## 2020-04-09 MED ORDER — INSULIN ASPART 100 UNIT/ML ~~LOC~~ SOLN
0.0000 [IU] | Freq: Three times a day (TID) | SUBCUTANEOUS | Status: DC
  Administered 2020-04-09 (×2): 3 [IU] via SUBCUTANEOUS
  Filled 2020-04-09: qty 1

## 2020-04-09 MED ORDER — LAMOTRIGINE 100 MG PO TABS
150.0000 mg | ORAL_TABLET | Freq: Every day | ORAL | Status: DC
  Administered 2020-04-09 – 2020-04-10 (×2): 150 mg via ORAL
  Filled 2020-04-09 (×2): qty 2

## 2020-04-09 MED ORDER — METFORMIN HCL 500 MG PO TABS
500.0000 mg | ORAL_TABLET | Freq: Two times a day (BID) | ORAL | Status: DC
Start: 2020-04-09 — End: 2020-04-09
  Administered 2020-04-09: 10:00:00 500 mg via ORAL
  Filled 2020-04-09: qty 1

## 2020-04-09 MED ORDER — GLIPIZIDE ER 5 MG PO TB24
5.0000 mg | ORAL_TABLET | Freq: Every day | ORAL | Status: DC
Start: 2020-04-09 — End: 2020-04-09
  Administered 2020-04-09: 10:00:00 5 mg via ORAL
  Filled 2020-04-09 (×2): qty 1

## 2020-04-09 MED ORDER — DIPHENHYDRAMINE HCL 25 MG PO CAPS: 25.0000 mg | ORAL_CAPSULE | ORAL | Status: DC | PRN

## 2020-04-09 MED ORDER — MONTELUKAST SODIUM 10 MG PO TABS: 10.0000 mg | ORAL_TABLET | Freq: Every day | ORAL | Status: DC | PRN

## 2020-04-09 MED ORDER — BACLOFEN 10 MG PO TABS
10.0000 mg | ORAL_TABLET | Freq: Every day | ORAL | Status: DC
  Administered 2020-04-09 – 2020-04-10 (×2): 10 mg via ORAL
  Filled 2020-04-09 (×3): qty 1

## 2020-04-09 MED ORDER — CHLORHEXIDINE GLUCONATE 4 % EX LIQD
60.0000 mL | Freq: Once | CUTANEOUS | Status: AC
  Administered 2020-04-10: 4 via TOPICAL
  Filled 2020-04-09: qty 15

## 2020-04-09 MED ORDER — SODIUM CHLORIDE 0.9 % IV SOLN
1.0000 g | Freq: Once | INTRAVENOUS | Status: AC
  Administered 2020-04-09: 05:00:00 1 g via INTRAVENOUS
  Filled 2020-04-09: qty 10

## 2020-04-09 MED ORDER — CARVEDILOL 3.125 MG PO TABS
6.2500 mg | ORAL_TABLET | Freq: Two times a day (BID) | ORAL | Status: DC
Start: 2020-04-09 — End: 2020-04-11
  Administered 2020-04-09 – 2020-04-11 (×4): 6.25 mg via ORAL
  Filled 2020-04-09 (×5): qty 2

## 2020-04-09 MED ORDER — ASPIRIN EC 81 MG PO TBEC
81.0000 mg | DELAYED_RELEASE_TABLET | Freq: Every day | ORAL | Status: DC
  Administered 2020-04-09 – 2020-04-11 (×3): 81 mg via ORAL
  Filled 2020-04-09 (×3): qty 1

## 2020-04-09 MED ORDER — MECLIZINE HCL 12.5 MG PO TABS: 25.0000 mg | ORAL_TABLET | Freq: Every day | ORAL | Status: DC | PRN

## 2020-04-09 MED ORDER — VENLAFAXINE HCL ER 75 MG PO CP24
300.0000 mg | ORAL_CAPSULE | Freq: Every day | ORAL | Status: DC
  Administered 2020-04-09 – 2020-04-10 (×2): 300 mg via ORAL
  Filled 2020-04-09 (×3): qty 4

## 2020-04-09 MED ORDER — INSULIN ASPART 100 UNIT/ML ~~LOC~~ SOLN: 0.0000 [IU] | Freq: Every day | SUBCUTANEOUS | Status: DC

## 2020-04-09 MED ORDER — ALPRAZOLAM 0.25 MG PO TABS: 0.2500 mg | ORAL_TABLET | Freq: Two times a day (BID) | ORAL | Status: DC | PRN

## 2020-04-09 MED ORDER — ROPINIROLE HCL 1 MG PO TABS
3.0000 mg | ORAL_TABLET | Freq: Three times a day (TID) | ORAL | Status: DC
  Administered 2020-04-09 – 2020-04-11 (×6): 3 mg via ORAL
  Filled 2020-04-09 (×8): qty 3

## 2020-04-09 MED ORDER — INSULIN ASPART 100 UNIT/ML ~~LOC~~ SOLN
3.0000 [IU] | Freq: Three times a day (TID) | SUBCUTANEOUS | Status: DC
  Administered 2020-04-09 – 2020-04-11 (×2): 3 [IU] via SUBCUTANEOUS

## 2020-04-09 MED ORDER — LORATADINE 10 MG PO TABS
10.0000 mg | ORAL_TABLET | Freq: Every day | ORAL | Status: DC
  Administered 2020-04-09 – 2020-04-11 (×3): 10 mg via ORAL
  Filled 2020-04-09 (×3): qty 1

## 2020-04-09 MED ORDER — MELOXICAM 7.5 MG PO TABS: 7.5000 mg | ORAL_TABLET | Freq: Every day | ORAL | Status: DC

## 2020-04-09 MED ORDER — SODIUM CHLORIDE 0.9 % IV SOLN
100.0000 mg | Freq: Two times a day (BID) | INTRAVENOUS | Status: DC
  Administered 2020-04-09 – 2020-04-11 (×4): 100 mg via INTRAVENOUS
  Filled 2020-04-09 (×8): qty 100

## 2020-04-09 MED ORDER — SODIUM CHLORIDE 0.9 % IV SOLN
1.0000 g | Freq: Once | INTRAVENOUS | Status: AC
  Administered 2020-04-09: 07:00:00 1 g via INTRAVENOUS
  Filled 2020-04-09: qty 10

## 2020-04-09 MED ORDER — CLINDAMYCIN PHOSPHATE 900 MG/50ML IV SOLN
900.0000 mg | Freq: Three times a day (TID) | INTRAVENOUS | Status: DC
  Administered 2020-04-09: 12:00:00 900 mg via INTRAVENOUS
  Filled 2020-04-09: qty 50

## 2020-04-09 MED ORDER — INSULIN ASPART 100 UNIT/ML ~~LOC~~ SOLN
0.0000 [IU] | Freq: Three times a day (TID) | SUBCUTANEOUS | Status: DC
  Administered 2020-04-10: 12:00:00 1 [IU] via SUBCUTANEOUS
  Administered 2020-04-11: 10:00:00 2 [IU] via SUBCUTANEOUS
  Filled 2020-04-09: qty 1

## 2020-04-09 MED ORDER — PANTOPRAZOLE SODIUM 40 MG PO TBEC
40.0000 mg | DELAYED_RELEASE_TABLET | Freq: Every day | ORAL | Status: DC
  Administered 2020-04-09 – 2020-04-11 (×3): 40 mg via ORAL
  Filled 2020-04-09 (×3): qty 1

## 2020-04-09 MED ORDER — CALCIUM CARBONATE ANTACID 500 MG PO CHEW: 2.0000 | CHEWABLE_TABLET | ORAL | Status: DC | PRN

## 2020-04-09 NOTE — Progress Notes (Signed)
MEDICATION RELATED CONSULT NOTE - INITIAL   Pharmacy Consult for ABX selection Indication: dog bite  Allergies  Allergen Reactions  . Amoxicillin Anaphylaxis and Other (See Comments)    Has patient had a PCN reaction causing immediate rash, facial/tongue/throat swelling, SOB or lightheadedness with hypotension: Yes Has patient had a PCN reaction causing severe rash involving mucus membranes or skin necrosis: Yes Has patient had a PCN reaction that required hospitalization: Yes Has patient had a PCN reaction occurring within the last 10 years: Yes If all of the above answers are "NO", then may proceed with Cephalosporin use.   [name] Hydrocodone Anaphylaxis  . Percocet [Oxycodone-Acetaminophen] Other (See Comments)    Causes chest pain /tightness. Pressure pain around her ribs.  . Vancomycin Itching  . Depacon [Valproic Acid] Other (See Comments)    Causes falls   . Iron Nausea And Vomiting    Patient Measurements: Height: 5' (152.4 cm) Weight: 106.6 kg (235 lb) IBW/kg (Calculated) : 45.5  Vital Signs: Temp: 98.3 F (36.8 C) (12/25 2130) Temp Source: Temporal (12/25 2130) BP: 114/50 (12/26 0300) Pulse Rate: 80 (12/26 0300) Intake/Output from previous day: 12/25 0701 - 12/26 0700 In: 150 [IV Piggyback:150] Out: -  Intake/Output from this shift: Total I/O In: 150 [IV Piggyback:150] Out: -   Labs: Recent Labs    04/09/20 0410  WBC 15.1*  HGB 10.9*  HCT 35.7*  PLT 400  CREATININE 1.40*   Estimated Creatinine Clearance: 43 mL/min (A) (by C-G formula based on SCr of 1.4 mg/dL (H)).   Microbiology: No results found for this or any previous visit (from the past 720 hour(s)).  Medical History: Past Medical History:  Diagnosis Date  . Anemia   . Anxiety   . Bipolar disorder ([place])   . Bulging lumbar disc    L3-4  . Chronic daily headache   . Chronic low back pain 09/20/2014  . COPD (chronic obstructive pulmonary disease) ([place])   . Degenerative arthritis   .  Depression   . Diabetes mellitus, type II ([place])   . Diabetic neuropathy ([place]) 09/16/2018  . DM type 2 with diabetic peripheral neuropathy ([place]) 09/20/2014  . Dyslipidemia   . Dyspnea   . Gastroparesis   . GERD (gastroesophageal reflux disease)   . Heart murmur   . History of hiatal hernia   . Hypothyroidism   . IBS (irritable bowel syndrome)   . Memory difficulty 09/20/2014  . Morbid obesity ([place])   . Neuropathy   . Obstructive sleep apnea on CPAP   . Restless legs syndrome (RLS) 09/17/2012  . Stroke (cerebrum) ([place]) 05/16/2017   Left parietal  . Urticaria     Assessment: Requested by EDP to analyze using Zosyn for dog bite in this pt w/ amoxicillin allergy given that pt rec'd Ancef at [facility].  Pt has had anaphylaxis with amox within the last ten years; recommended that Zosyn NOT be given since it is more closely related to amoxicillin.  Plan:  Recommended Rocephin + clindamycin as initial IV coverage for dog bite; this was given at [facility].  If the cephalosporin is not tolerated, may consider Levaquin + clinda IV.  When appropriate for PO therapy, may continue doxycycline + metronidazole as prescribed at another hospital.  Wynona Neat, PharmD, BCPS  04/09/2020,5:33 AM

## 2020-04-09 NOTE — ED Provider Notes (Signed)
[facility] EMERGENCY DEPARTMENT Provider Note   CSN: [id number] Arrival date & time: 04/08/20  2023   Time seen 3:30 AM  History Chief Complaint  Patient presents with  . Animal Bite    Norma [name] is a 69 y.o. female.  HPI   Patient states about 5 AM on December 25 she had gotten up out of bed to use the bathroom.  When she came back to bed one of her dogs was laying on the bed and she reached down to get him to move however it was the pit bull and he has a sore on his back and he she states he will snap if he thinks you are going to touch it.  He snapped and bit her left index finger.  She was treated at [facility] ER around 9:00 this morning.  She received Ancef IM, her tetanus was updated, she had x-rays showing a oblique fracture of the proximal phalanx of the left index finger with dorsal and ulnar displacement of the fracture.  They attempted to reduce the finger without success.  They did suture the wound and told her she needed to follow-up with orthopedics to have the wound washed out.  They were going to keep her there until they could arrange orthopedic evaluation.  However patient decided to leave.  She did not pick up her antibiotics that were called into her pharmacy which were due at midnight (doxycycline and Flagyl).  Patient is right-handed.  Patient reports ports the pit bull is a stray dog that she has had for 2 or 3 months.  She has not taken him to the vet so she does not know about his vaccination status.  PCP Celene Squibb, MD   Past Medical History:  Diagnosis Date  . Anemia   . Anxiety   . Bipolar disorder ([place])   . Bulging lumbar disc    L3-4  . Chronic daily headache   . Chronic low back pain 09/20/2014  . COPD (chronic obstructive pulmonary disease) ([place])   . Degenerative arthritis   . Depression   . Diabetes mellitus, type II ([place])   . Diabetic neuropathy ([place]) 09/16/2018  . DM type 2 with diabetic peripheral neuropathy ([place]) 09/20/2014  . Dyslipidemia    . Dyspnea   . Gastroparesis   . GERD (gastroesophageal reflux disease)   . Heart murmur   . History of hiatal hernia   . Hypothyroidism   . IBS (irritable bowel syndrome)   . Memory difficulty 09/20/2014  . Morbid obesity ([place])   . Neuropathy   . Obstructive sleep apnea on CPAP   . Restless legs syndrome (RLS) 09/17/2012  . Stroke (cerebrum) ([place]) 05/16/2017   Left parietal  . Urticaria     Patient Active Problem List   Diagnosis Date Noted  . Low back pain 03/01/2020  . Hypoxemia 09/23/2019  . Sleep related hypoxia 09/23/2019  . Obesity with alveolar hypoventilation and body mass index (BMI) of 40 or greater ([place]) 09/23/2019  . Hypersomnia with long sleep time, idiopathic 08/16/2019  . Insomnia due to other mental disorder 08/16/2019  . Dry mouth not due to sicca syndrome 08/16/2019  . Chronic coughing 08/16/2019  . Unilateral primary osteoarthritis, right knee 02/18/2019  . Bilateral primary osteoarthritis of knee 01/20/2019  . Diabetic neuropathy ([place]) 09/16/2018  . Unilateral primary osteoarthritis, left knee 09/16/2018  . Class 3 severe obesity due to excess calories with serious comorbidity and body mass index (BMI) of 45.0 to  49.9 in adult [facility]) 02/20/2018  . Mixed hyperlipidemia 09/11/2017  . Pressure injury of skin 09/02/2017  . Coronary artery disease involving native heart without angina pectoris   . S/P CABG x 3   . Leukocytosis   . Acute blood loss anemia   . Post-operative pain   . Bipolar affective disorder ([place])   . Diabetes mellitus type 2 in obese ([place])   . History of CVA (cerebrovascular accident)   . Hypokalemia   . Hx of CABG 08/29/2017  . Coronary artery disease involving native coronary artery of native heart with unstable angina pectoris ([place]) 08/28/2017  . Unstable angina ([place]) 08/27/2017  . Cardiomyopathy ([place]) 08/22/2017  . Stroke (cerebrum) ([place]) 05/16/2017  . Anemia 09/19/2016  . Dizziness 06/21/2016  . Chest pain with moderate risk for  cardiac etiology 04/23/2016  . Bipolar I disorder, most recent episode depressed ([place]) 06/05/2015  . DM type 2 causing vascular disease ([place]) 09/20/2014  . Chronic bilateral low back pain with bilateral sciatica 09/20/2014  . Morbid obesity, unspecified obesity type ([place]) 09/20/2014  . Memory difficulty 09/20/2014  . Lumbosacral spondylosis without myelopathy 12/25/2012  . Gastroparesis 11/04/2012  . Nausea with vomiting 11/04/2012  . Restless legs syndrome (RLS) 09/17/2012  . Headache(784.0) 03/19/2012  . GERD (gastroesophageal reflux disease) 02/16/2012  . Abdominal pain 02/16/2012  . Hypothyroidism 02/16/2012  . Tremor 02/16/2012  . Hyperlipidemia associated with type 2 diabetes mellitus ([place]) 04/01/2008  . OSA (obstructive sleep apnea) 04/01/2008  . Essential hypertension, benign 04/01/2008  . LBBB (left bundle branch block) 04/01/2008    Past Surgical History:  Procedure Laterality Date  . ABDOMINAL HYSTERECTOMY    . ARTHROSCOPY KNEE W/ DRILLING  06/2011   and Decemer of 2013.  [name] Carpel tunnel  1980's  . CATARACT EXTRACTION W/PHACO Right 03/21/2017   Procedure: CATARACT EXTRACTION PHACO AND INTRAOCULAR LENS PLACEMENT RIGHT EYE;  Surgeon: Baruch Goldmann, MD;  Location: [facility] OR[facility];  Service: Ophthalmology;  Laterality: Right;  CDE: 2.91   . CATARACT EXTRACTION W/PHACO Left 04/18/2017   Procedure: CATARACT EXTRACTION PHACO AND INTRAOCULAR LENS PLACEMENT LEFT EYE;  Surgeon: Baruch Goldmann, MD;  Location: [facility] OR[facility];  Service: Ophthalmology;  Laterality: Left;  left  . CHOLECYSTECTOMY    . COLONOSCOPY WITH PROPOFOL N/A 10/04/2016   Procedure: COLONOSCOPY WITH PROPOFOL;  Surgeon: Rogene Houston, MD;  Location: [facility] ENDO SUITE;  Service: Endoscopy;  Laterality: N/A;  11:10  . CORONARY ARTERY BYPASS GRAFT N/A 08/29/2017   Procedure: CORONARY ARTERY BYPASS GRAFTING (CABG) x 3 WITH ENDOSCOPIC HARVESTING OF RIGHT SAPHENOUS VEIN;  Surgeon: Ivin Poot, MD;  Location: [facility];  Service: Open Heart  Surgery;  Laterality: N/A;  . ESOPHAGOGASTRODUODENOSCOPY N/A 04/25/2016   Procedure: ESOPHAGOGASTRODUODENOSCOPY (EGD);  Surgeon: Rogene Houston, MD;  Location: [facility];  Service: Endoscopy;  Laterality: N/A;  730  . LEFT HEART CATH AND CORONARY ANGIOGRAPHY N/A 08/27/2017   Procedure: LEFT HEART CATH AND CORONARY ANGIOGRAPHY;  Surgeon: Sherren Mocha, MD;  Location: [facility];  Service: Cardiovascular::  pLAD 95% - p-mLAD 50%, ostD1 90% -pD1 80%; ostOM1 90%; rPDA 80%. EF ~50-55% - HK of dital Anterolateral & Apical wall.  - Rec CVTS c/s  . RECTAL SURGERY     fissure  . SHOULDER SURGERY Left    arthroscopy in March of this year  . TEE WITHOUT CARDIOVERSION N/A 08/29/2017   Procedure: TRANSESOPHAGEAL ECHOCARDIOGRAM (TEE);  Surgeon: Prescott Gum, Collier Salina, MD;  Location: [facility];  Service: Open Heart  Surgery;  Laterality: N/A;  . TRANSTHORACIC ECHOCARDIOGRAM  06/06/2017   Mild to moderate reduced EF 40 and 45%.  Anterior septal, inferoseptal and basal to mid inferior hypokinesis.  GR 1 DD.  No significant valvular lesion     OB History   No obstetric history on file.     Family History  Problem Relation Age of Onset  . Hypertension Mother   . Lymphoma Mother   . Depression Mother   . Arthritis Father   . Alcohol abuse Father   . Hypertension Sister   . Cancer Brother        kidney and lung  . Alcohol abuse Brother   . Alcohol abuse Paternal Uncle   . Alcohol abuse Paternal Grandfather   . Alcohol abuse Paternal Grandmother   . Allergic rhinitis Neg Hx   . Angioedema Neg Hx   . Asthma Neg Hx   . Atopy Neg Hx   . Eczema Neg Hx   . Immunodeficiency Neg Hx   . Urticaria Neg Hx     Social History   Tobacco Use  . Smoking status: Former Smoker    Types: Cigarettes    Quit date: 02/04/1971    Years since quitting: 49.2  . Smokeless tobacco: Never Used  . Tobacco comment: smoked 2 cigarettes a day  Vaping Use  . Vaping Use: Never used  Substance Use Topics  . Alcohol  use: No    Alcohol/week: 0.0 standard drinks  . Drug use: No    Home Medications Prior to Admission medications   Medication Sig Start Date End Date Taking? Authorizing Provider  ALPRAZolam (XANAX) 0.25 MG tablet Take 1 tablet (0.25 mg total) by mouth 2 (two) times daily as needed for anxiety. 02/09/20   Cloria Spring, MD  aspirin EC 81 MG tablet Take 1 tablet (81 mg total) by mouth daily. 01/20/18   Arnoldo Lenis, MD  baclofen (LIORESAL) 10 MG tablet TAKE ONE TABLET BY MOUTH AT BEDTIME. 01/04/20   Kathrynn Ducking, MD  buPROPion (WELLBUTRIN XL) 300 MG 24 hr tablet Take 1 tablet (300 mg total) by mouth every morning. 02/09/20   Cloria Spring, MD  calcium carbonate (TUMS - DOSED IN MG ELEMENTAL CALCIUM) 500 MG chewable tablet Chew 2 tablets by mouth as needed for indigestion or heartburn.     [provider]  carvedilol (COREG) 6.25 MG tablet TAKE ONE TABLET BY MOUTH TWICE DAILY (MORNING ,EVENING) 10/06/19   Branch, Alphonse Guild, MD  cetirizine (ZYRTEC) 10 MG tablet Take 1 tablet tablet 1-2 times daily. Patient taking differently: Take 10 mg by mouth daily. Take 1 tablet tablet 1-2 times daily. 04/25/19   Valentina Shaggy, MD  diclofenac Sodium (VOLTAREN) 1 % GEL Apply topically 4 (four) times daily.    [provider]  diphenhydrAMINE (BENADRYL) 25 mg capsule Take 25 mg by mouth as needed.    [provider]  EPINEPHrine (EPIPEN 2-PAK) 0.3 mg/0.3 mL IJ SOAJ injection Inject 0.3 mg into the muscle once.    [provider]  esomeprazole (NEXIUM) 40 MG capsule Take 40 mg by mouth daily before supper.  02/09/18   [provider]  Evolocumab (REPATHA SURECLICK) [id number] MG/ML SOAJ Inject 1 Dose into the skin every 14 (fourteen) days. 07/14/19   Hilty, Nadean Corwin, MD  famotidine (PEPCID) 20 MG tablet Take 1 tablet (20 mg total) by mouth daily. Patient taking differently: Take 20 mg by mouth at bedtime.  04/25/19  Valentina Shaggy, MD  gabapentin  (NEURONTIN) 400 MG capsule Take 1 capsule (400 mg total) by mouth 3 (three) times daily. 12/27/19   Kathrynn Ducking, MD  glipiZIDE (GLUCOTROL XL) 5 MG 24 hr tablet TAKE ONE TABLET BY MOUTH DAILY WITH BREAKFAST 10/14/19   Cassandria Anger, MD  lamoTRIgine (LAMICTAL) 150 MG tablet Take 1 tablet (150 mg total) by mouth at bedtime. 02/09/20   Cloria Spring, MD  levothyroxine (SYNTHROID, LEVOTHROID) 100 MCG tablet Take 100 mcg by mouth daily before breakfast.     [provider]  losartan (COZAAR) 25 MG tablet TAKE ONE TABLET BY MOUTH DAILY (MORNING) 02/29/20   Branch, Alphonse Guild, MD  meclizine (ANTIVERT) 25 MG tablet Take 1 tablet (25 mg total) by mouth 3 (three) times daily as needed for dizziness. Patient taking differently: Take 25 mg by mouth daily as needed for dizziness.  03/29/14   Virgel Manifold, MD  meloxicam (MOBIC) 7.5 MG tablet Take 1 tablet (7.5 mg total) by mouth daily. 03/15/20   Wurst, [place], PA-C  metFORMIN (GLUCOPHAGE) 500 MG tablet TAKE ONE TABLET BY MOUTH TWICE DAILY WITH A MEAL (MORNING ,EVENING) 10/14/19   Nida, Marella Chimes, MD  montelukast (SINGULAIR) 10 MG tablet Take 10 mg by mouth daily as needed.  08/02/19   [provider]  ondansetron (ZOFRAN) 4 MG tablet Take 1 tablet (4 mg total) by mouth 2 (two) times daily as needed for nausea or vomiting. 10/20/18   Rehman, Mechele Dawley, MD  rOPINIRole (REQUIP) 3 MG tablet TAKE ONE TABLET BY MOUTH EVERY MORNING, ,NOON AND AT BEDTIME. (PRT PT MORNING,EVENING,BEDTIME) 10/14/19   Kathrynn Ducking, MD  tizanidine (ZANAFLEX) 2 MG capsule Take 1 capsule (2 mg total) by mouth 3 (three) times daily. 03/15/20   Wurst, [place], PA-C  traMADol (ULTRAM) 50 MG tablet Take 100 mg by mouth 4 (four) times daily as needed.     [provider]  traZODone (DESYREL) 150 MG tablet Take 1 tablet (150 mg total) by mouth at bedtime. 02/09/20   Cloria Spring, MD  venlafaxine XR (EFFEXOR-XR) 150 MG 24 hr capsule Take 2 capsules (300  mg total) by mouth in the morning. 02/09/20   Cloria Spring, MD    Allergies    Amoxicillin, Hydrocodone, Percocet [oxycodone-acetaminophen], Vancomycin, Depacon [valproic acid], and Iron  Review of Systems   Review of Systems  All other systems reviewed and are negative.   Physical Exam Updated Vital Signs BP (!) 114/50   Pulse 80   Temp 98.3 F (36.8 C) (Temporal)   Resp 16   Ht 5' (1.524 m)   Wt 106.6 kg   SpO2 97%   BMI 45.90 kg/m   Physical Exam Vitals and nursing note reviewed.  Constitutional:      General: She is not in acute distress.    Appearance: Normal appearance. She is obese.  HENT:     Head: Normocephalic and atraumatic.     Right Ear: External ear normal.     Left Ear: External ear normal.  Eyes:     Extraocular Movements: Extraocular movements intact.     Conjunctiva/sclera: Conjunctivae normal.  Cardiovascular:     Rate and Rhythm: Normal rate.  Pulmonary:     Effort: Pulmonary effort is normal. No respiratory distress.  Musculoskeletal:     Cervical back: Normal range of motion.     Comments: Patient's finger was in a finger splint and wrapped.  It was removed.  She  is noted to have a irregular laceration over the middle phalanx of the left index finger with deformity.  She is also noted to have swelling and redness of the MCP and extending over the dorsum of her hand which she states has developed since she left the other facility this afternoon.  Skin:    General: Skin is warm and dry.     Capillary Refill: Capillary refill takes less than 2 seconds.     Findings: Erythema present.  Neurological:     General: No focal deficit present.     Mental Status: She is alert and oriented to person, place, and time.     Cranial Nerves: No cranial nerve deficit.  Psychiatric:        Mood and Affect: Mood normal.        Behavior: Behavior normal.        Thought Content: Thought content normal.         ED Results / Procedures / Treatments    Labs (all labs ordered are listed, but only abnormal results are displayed) Results for orders placed or performed during the hospital encounter of [id number]  Basic metabolic panel  Result Value Ref Range   Sodium 135 135 - 145 mmol/L   Potassium 3.6 3.5 - 5.1 mmol/L   Chloride 95 (L) 98 - 111 mmol/L   CO2 28 22 - 32 mmol/L   Glucose, Bld 158 (H) 70 - 99 mg/dL   BUN 24 (H) 8 - 23 mg/dL   Creatinine, Ser 1.40 (H) 0.44 - 1.00 mg/dL   Calcium 8.9 8.9 - 10.3 mg/dL   GFR, Estimated 41 (L) >60 mL/min   Anion gap 12 5 - 15  CBC with Differential  Result Value Ref Range   WBC 15.1 (H) 4.0 - 10.5 K/uL   RBC 3.77 (L) 3.87 - 5.11 MIL/uL   Hemoglobin 10.9 (L) 12.0 - 15.0 g/dL   HCT 35.7 (L) 36.0 - 46.0 %   MCV 94.7 80.0 - 100.0 fL   MCH 28.9 26.0 - 34.0 pg   MCHC 30.5 30.0 - 36.0 g/dL   RDW 15.2 11.5 - 15.5 %   Platelets 400 150 - 400 K/uL   nRBC 0.0 0.0 - 0.2 %   Neutrophils Relative % 71 %   Neutro Abs 10.6 (H) 1.7 - 7.7 K/uL   Lymphocytes Relative 16 %   Lymphs Abs 2.4 0.7 - 4.0 K/uL   Monocytes Relative 9 %   Monocytes Absolute 1.4 (H) 0.1 - 1.0 K/uL   Eosinophils Relative 4 %   Eosinophils Absolute 0.6 (H) 0.0 - 0.5 K/uL   Basophils Relative 0 %   Basophils Absolute 0.1 0.0 - 0.1 K/uL   Immature Granulocytes 0 %   Abs Immature Granulocytes 0.05 0.00 - 0.07 K/uL   Laboratory interpretation all normal except stable renal insufficiency, stable anemia, leukocytosis, hyperglycemia    EKG None  Radiology DG Finger Index Left  Result Date: 04/09/2020 CLINICAL DATA:  Dog bite to finger EXAM: LEFT INDEX FINGER 2+V COMPARISON:  None. FINDINGS: Oblique fracture through the distal shaft of the second proximal phalanx, with fracture displacement and overlap best seen on the lateral view. The fracture is open per report, with laceration. No opaque foreign body IMPRESSION: Displaced and angulated second proximal phalanx shaft fracture. No opaque foreign body. Electronically Signed   By:  Monte Fantasia M.D.   On: 04/09/2020 04:46      LEFT HAND - COMPLETE 3+ VIEW  COMPARISON:  06/10/2014  FINDINGS: Oblique fracture involving the index finger proximal phalanx. The fracture is along the mid/distal aspect of the proximal phalanx. Dorsal and ulnar displacement of the fracture. Soft tissue swelling and irregularity involving the index finger. No other fractures. No evidence for a radiopaque foreign body.  IMPRESSION: Displaced fracture of the index finger proximal phalanx.   Electronically Signed By: Markus Daft M.D. On: 04/08/2020 09:45    Procedures .Critical Care Performed by: Rolland Porter, MD Authorized by: Rolland Porter, MD   Critical care provider statement:    Critical care time (minutes):  35   Critical care was necessary to treat or prevent imminent or life-threatening deterioration of the following conditions:  Trauma   Critical care was time spent personally by me on the following activities:  Discussions with consultants, examination of patient, obtaining history from patient or surrogate, ordering and review of laboratory studies, ordering and review of radiographic studies, re-evaluation of patient's condition and review of old charts   Care discussed with: admitting provider     (including critical care time) Respiratory Pathogen Panel with COVID-19 (04/08/2020 12:02 PM EST) Respiratory Pathogen Panel with COVID-19 (04/08/2020 12:02 PM EST)  Component Value Ref Range Performed At Pathologist Signature  Adenovirus Not Detected Not Detected [place] PCR (229E, HKU1, NL63, OC43) Not Detected Not Detected [place] Not Detected Not Detected [place]   Rhinovirus/Enterovirus Not Detected Not Detected [place]   Influenza A Not Detected Not Detected [place]   Influenza A/H1 Not Detected Not Detected [place]   Influenza A/H3 Not Detected Not Detected [place]   Influenza A/H1-2009 Not Detected Not Detected [place]   Influenza B Not Detected Not Detected [place]   Parainfluenza 1 Not Detected Not Detected [place]   Parainfluenza 2 Not Detected Not Detected [place]   Parainfluenza 3 Not Detected Not Detected [place]   Parainfluenza 4 Not Detected Not Detected [place]   SARS-CoV-2 PCR Not Detected Not Detected [place]   RSV A PCR Not Detected Not Detected [place]   RSV B PCR Not Detected Not Detected [place]   Chlamydophila (Chlamydia) pneumoniae Not Detected Not Detected [place]   Mycoplasma pneumoniae Not Detected Not Detected [place]      Medications Ordered in ED Medications  cefTRIAXone (ROCEPHIN) 1 g in sodium chloride 0.9 % 100 mL IVPB (1 g Intravenous New Bag/Given 04/09/20 0446)  clindamycin (CLEOCIN) IVPB 600 mg (has no administration in time range)  fentaNYL (SUBLIMAZE) injection 50 mcg (50 mcg Intravenous Given 04/09/20 0416)    ED Course  I have reviewed the triage vital signs and the nursing notes.  Pertinent labs & imaging results that were available during my care of the patient were reviewed by me and considered in my medical decision making (see chart for details).    MDM Rules/Calculators/A&P                          Patient had an IV inserted.  She was given IV fentanyl for pain   She had gotten Ancef at the other hospital today without difficulty.  I did a consult to the pharmacist about her antibiotics.  They felt like Zosyn would be more likely to have a reaction than Ancef with her Amoxicillin allergy.  They recommend Rocephin and clindamycin and less preferred  Levaquin and Clindamycin.  X-rays were obtained of the finger so we would have x-rays in our system to review.  She was given the Rocephin and clindamycin IV as recommended by the pharmacist.  Patient presents with a compound fracture of the left index finger from my dog bite from a pit bull.  She is already getting a cellulitis from the bite.  I am going to discuss with the hand surgeon on call about admission.  5:07 AM Dr. Aline Brochure, orthopedist on-call for hand, contacted wants medicine to admit for IV antibiotics and he will see in the morning.  5:28 AM Dr. Olevia Bowens, hospitalist will admit.  Final Clinical Impression(s) / ED Diagnoses Final diagnoses:  Dog bite  Cellulitis of left hand  Open displaced fracture of middle phalanx of left index finger with routine healing, subsequent encounter    Rx / DC Orders  Plan admission  Rolland Porter, MD, Barbette Or, MD 04/09/20 0530

## 2020-04-09 NOTE — ED Notes (Signed)
MD placed kerlex dressing to patients finger.

## 2020-04-09 NOTE — Consult Note (Addendum)
Norma [name]  04/09/2020  Body mass index is 45.9 kg/m.  Consult hospital Reason for consultation delayed presentation open fracture left index finger secondary to dog bite Consulting requested by Dr. Reubin Milan   ASSESSMENT AND PLAN:     69 year old female with dog bite open fracture left index finger.  Patient will have 24 hours of antibiotics to decrease bacterial load prior to open reduction internal fixation left index finger  Diagnosis open fracture left index finger proximal phalanx secondary to dog bite  Imaging 3 views left index finger: This images have been personally reviewed and my reading is: Left index finger proximal phalanx fracture with angulation and displacement  Plan:  (Rx., Inj., surg., Frx, MRI/CT, XR:2)  24 hours of IV antibiotics Close glucose monitoring Open reduction internal fixation left index finger on Monday, December 27 at 1 PM  The procedure has been fully reviewed with the patient; The risks and benefits of surgery have been discussed and explained and understood. Alternative treatment has also been reviewed, questions were encouraged and answered. The postoperative plan is also been reviewed.  The patient is at high risk for complications such as infection, stiffness of the PIP and DIP joint and even amputation this has been relayed to the patient she agrees to proceed with surgery  HISTORY SECTION :  Chief Complaint  Patient presents with  . Animal Bite   HPI The patient presents for evaluation of moderate to severe pain left index finger for 1 day associated with open fracture and laceration   The patient went to [facility] had evaluation.  She had irrigation and closure of the wound attempted closed reduction.  The patient left AMA secondary to [facility] not having an orthopedist available for consultation and according to the patient phone calls to [facility] and [facility] did not produce a bed available for  her Tuesday.   Review of Systems  Constitutional: Negative for chills and fever.  Respiratory: Positive for shortness of breath.        COPD occasional short of breath  Cardiovascular: Negative for chest pain.  Skin: Negative for itching.  All other systems reviewed and are negative.    has a past medical history of Anemia, Anxiety, Bipolar disorder ([place]), Bulging lumbar disc, Chronic daily headache, Chronic low back pain (09/20/2014), COPD (chronic obstructive pulmonary disease) ([place]), Degenerative arthritis, Depression, Diabetes mellitus, type II ([place]), Diabetic neuropathy ([place]) (09/16/2018), DM type 2 with diabetic peripheral neuropathy ([place]) (09/20/2014), Dyslipidemia, Dyspnea, Gastroparesis, GERD (gastroesophageal reflux disease), Heart murmur, History of hiatal hernia, Hypothyroidism, IBS (irritable bowel syndrome), Memory difficulty (09/20/2014), Morbid obesity ([place]), Neuropathy, Obstructive sleep apnea on CPAP, Restless legs syndrome (RLS) (09/17/2012), Stroke (cerebrum) ([place]) (05/16/2017), and Urticaria.   Past Surgical History:  Procedure Laterality Date  . ABDOMINAL HYSTERECTOMY    . ARTHROSCOPY KNEE W/ DRILLING  06/2011   and Decemer of 2013.  [name] Carpel tunnel  1980's  . CATARACT EXTRACTION W/PHACO Right 03/21/2017   Procedure: CATARACT EXTRACTION PHACO AND INTRAOCULAR LENS PLACEMENT RIGHT EYE;  Surgeon: Baruch Goldmann, MD;  Location: [facility];  Service: Ophthalmology;  Laterality: Right;  CDE: 2.91   . CATARACT EXTRACTION W/PHACO Left 04/18/2017   Procedure: CATARACT EXTRACTION PHACO AND INTRAOCULAR LENS PLACEMENT LEFT EYE;  Surgeon: Baruch Goldmann, MD;  Location: [facility];  Service: Ophthalmology;  Laterality: Left;  left  . CHOLECYSTECTOMY    . COLONOSCOPY WITH PROPOFOL N/A 10/04/2016   Procedure: COLONOSCOPY WITH PROPOFOL;  Surgeon: Rogene Houston,  MD;  Location: [facility];  Service: Endoscopy;  Laterality: N/A;  11:10  . CORONARY ARTERY BYPASS GRAFT N/A 08/29/2017   Procedure: CORONARY  ARTERY BYPASS GRAFTING (CABG) x 3 WITH ENDOSCOPIC HARVESTING OF RIGHT SAPHENOUS VEIN;  Surgeon: Ivin Poot, MD;  Location: [place];  Service: Open Heart Surgery;  Laterality: N/A;  . ESOPHAGOGASTRODUODENOSCOPY N/A 04/25/2016   Procedure: ESOPHAGOGASTRODUODENOSCOPY (EGD);  Surgeon: Rogene Houston, MD;  [facility]: [facility];  Service: Endoscopy;  Laterality: N/A;  730  . LEFT HEART CATH AND CORONARY ANGIOGRAPHY N/A 08/27/2017   Procedure: LEFT HEART CATH AND CORONARY ANGIOGRAPHY;  Surgeon: Sherren Mocha, MD;  Location: [place] [facility];  Service: Cardiovascular::  pLAD 95% - p-mLAD 50%, ostD1 90% -pD1 80%; ostOM1 90%; rPDA 80%. EF ~50-55% - HK of dital Anterolateral & Apical wall.  - Rec CVTS c/s  . RECTAL SURGERY     fissure  . SHOULDER SURGERY Left    arthroscopy in March of this year  . TEE WITHOUT CARDIOVERSION N/A 08/29/2017   Procedure: TRANSESOPHAGEAL ECHOCARDIOGRAM (TEE);  Surgeon: Prescott Gum, Collier Salina, MD;  Location: [place];  Service: Open Heart Surgery;  Laterality: N/A;  . TRANSTHORACIC ECHOCARDIOGRAM  06/06/2017   Mild to moderate reduced EF 40 and 45%.  Anterior septal, inferoseptal and basal to mid inferior hypokinesis.  GR 1 DD.  No significant valvular lesion    Social History   Tobacco Use  . Smoking status: Former Smoker    Types: Cigarettes    Quit date: 02/04/1971    Years since quitting: 49.2  . Smokeless tobacco: Never Used  . Tobacco comment: smoked 2 cigarettes a day  Vaping Use  . Vaping Use: Never used  Substance Use Topics  . Alcohol use: No    Alcohol/week: 0.0 standard drinks  . Drug use: No    Family History  Problem Relation Age of Onset  . Hypertension Mother   . Lymphoma Mother   . Depression Mother   . Arthritis Father   . Alcohol abuse Father   . Hypertension Sister   . Cancer Brother        kidney and lung  . Alcohol abuse Brother   . Alcohol abuse Paternal Uncle   . Alcohol abuse Paternal Grandfather   . Alcohol abuse Paternal  Grandmother   . Allergic rhinitis Neg Hx   . Angioedema Neg Hx   . Asthma Neg Hx   . Atopy Neg Hx   . Eczema Neg Hx   . Immunodeficiency Neg Hx   . Urticaria Neg Hx       Allergies  Allergen Reactions  . Amoxicillin Anaphylaxis and Other (See Comments)    Has patient had a PCN reaction causing immediate rash, facial/tongue/throat swelling, SOB or lightheadedness with hypotension: Yes Has patient had a PCN reaction causing severe rash involving mucus membranes or skin necrosis: Yes Has patient had a PCN reaction that required hospitalization: Yes Has patient had a PCN reaction occurring within the last 10 years: Yes If all of the above answers are "NO", then may proceed with Cephalosporin use.   [name] Hydrocodone Anaphylaxis  . Percocet [Oxycodone-Acetaminophen] Other (See Comments)    Causes chest pain /tightness. Pressure pain around her ribs.  . Vancomycin Itching  . Depacon [Valproic Acid] Other (See Comments)    Causes falls   . Iron Nausea And Vomiting     Current Facility-Administered Medications:  .  ALPRAZolam (XANAX) tablet 0.25 mg, 0.25 mg, Oral, BID  PRN, Reubin Milan, MD .  aspirin EC tablet 81 mg, 81 mg, Oral, Daily, Reubin Milan, MD .  baclofen (LIORESAL) tablet 10 mg, 10 mg, Oral, QHS, Reubin Milan, MD .  buPROPion (WELLBUTRIN XL) 24 hr tablet 300 mg, 300 mg, Oral, q morning - 10a, Reubin Milan, MD .  calcium carbonate (TUMS - dosed in mg elemental calcium) chewable tablet 400 mg of elemental calcium, 2 tablet, Oral, PRN, Reubin Milan, MD .  carvedilol (COREG) tablet 6.25 mg, 6.25 mg, Oral, BID WC, Reubin Milan, MD .  Derrill Memo ON 04/10/2020] cefTRIAXone (ROCEPHIN) 2 g in sodium chloride 0.9 % 100 mL IVPB, 2 g, Intravenous, Q24H, Reubin Milan, MD .  clindamycin (CLEOCIN) IVPB 900 mg, 900 mg, Intravenous, Q8H, Reubin Milan, MD .  diphenhydrAMINE (BENADRYL) capsule 25 mg, 25 mg, Oral, PRN, Reubin Milan, MD .   enoxaparin (LOVENOX) injection 40 mg, 40 mg, Subcutaneous, Q24H, Reubin Milan, MD .  famotidine (PEPCID) tablet 20 mg, 20 mg, Oral, QHS, Reubin Milan, MD .  fentaNYL (SUBLIMAZE) injection 100 mcg, 100 mcg, Intravenous, Q2H PRN, Reubin Milan, MD .  gabapentin (NEURONTIN) capsule 400 mg, 400 mg, Oral, TID, Reubin Milan, MD .  glipiZIDE (GLUCOTROL XL) 24 hr tablet 5 mg, 5 mg, Oral, Q breakfast, Reubin Milan, MD .  insulin aspart (novoLOG) injection 0-20 Units, 0-20 Units, Subcutaneous, TID WC, Reubin Milan, MD .  lamoTRIgine (LAMICTAL) tablet 150 mg, 150 mg, Oral, QHS, Reubin Milan, MD .  levothyroxine (SYNTHROID) tablet 100 mcg, 100 mcg, Oral, QAC breakfast, Reubin Milan, MD .  loratadine (CLARITIN) tablet 10 mg, 10 mg, Oral, Daily, Reubin Milan, MD .  losartan (COZAAR) tablet 25 mg, 25 mg, Oral, Daily, Reubin Milan, MD .  meclizine (ANTIVERT) tablet 25 mg, 25 mg, Oral, Daily PRN, Reubin Milan, MD .  metFORMIN (GLUCOPHAGE) tablet 500 mg, 500 mg, Oral, BID WC, Reubin Milan, MD .  montelukast (SINGULAIR) tablet 10 mg, 10 mg, Oral, Daily PRN, Reubin Milan, MD .  ondansetron [facility]) tablet 4 mg, 4 mg, Oral, Q6H PRN **OR** ondansetron (ZOFRAN) injection 4 mg, 4 mg, Intravenous, Q6H PRN, Reubin Milan, MD .  pantoprazole (PROTONIX) EC tablet 40 mg, 40 mg, Oral, Daily, Reubin Milan, MD .  rOPINIRole (REQUIP) tablet 3 mg, 3 mg, Oral, TID, Reubin Milan, MD .  venlafaxine XR [facility]) 24 hr capsule 300 mg, 300 mg, Oral, QHS, Reubin Milan, MD  Current Outpatient Medications:  .  ALPRAZolam (XANAX) 0.25 MG tablet, Take 1 tablet (0.25 mg total) by mouth 2 (two) times daily as needed for anxiety., Disp: 60 tablet, Rfl: 2 .  aspirin EC 81 MG tablet, Take 1 tablet (81 mg total) by mouth daily., Disp: 90 tablet, Rfl: 3 .  baclofen (LIORESAL) 10 MG tablet, TAKE ONE TABLET BY MOUTH AT BEDTIME.,  Disp: 30 tablet, Rfl: 6 .  buPROPion (WELLBUTRIN XL) 300 MG 24 hr tablet, Take 1 tablet (300 mg total) by mouth every morning., Disp: 30 tablet, Rfl: 2 .  calcium carbonate (TUMS - DOSED IN MG ELEMENTAL CALCIUM) 500 MG chewable tablet, Chew 2 tablets by mouth as needed for indigestion or heartburn. , Disp: , Rfl:  .  carvedilol (COREG) 6.25 MG tablet, TAKE ONE TABLET BY MOUTH TWICE DAILY (MORNING ,EVENING), Disp: 180 tablet, Rfl: 2 .  cetirizine (ZYRTEC) 10 MG tablet, Take 1 tablet tablet 1-2 times  daily. (Patient taking differently: Take 10 mg by mouth daily. Take 1 tablet tablet 1-2 times daily.), Disp: 60 tablet, Rfl: 5 .  diclofenac Sodium (VOLTAREN) 1 % GEL, Apply topically 4 (four) times daily., Disp: , Rfl:  .  diphenhydrAMINE (BENADRYL) 25 mg capsule, Take 25 mg by mouth as needed., Disp: , Rfl:  .  esomeprazole (NEXIUM) 40 MG capsule, Take 40 mg by mouth daily before supper. , Disp: , Rfl: 12 .  Evolocumab (REPATHA SURECLICK) [id number] MG/ML SOAJ, Inject 1 Dose into the skin every 14 (fourteen) days., Disp: 2 pen, Rfl: 11 .  famotidine (PEPCID) 20 MG tablet, Take 1 tablet (20 mg total) by mouth daily. (Patient taking differently: Take 20 mg by mouth at bedtime.), Disp: 30 tablet, Rfl: 5 .  gabapentin (NEURONTIN) 400 MG capsule, Take 1 capsule (400 mg total) by mouth 3 (three) times daily., Disp: 270 capsule, Rfl: 1 .  glipiZIDE (GLUCOTROL XL) 5 MG 24 hr tablet, TAKE ONE TABLET BY MOUTH DAILY WITH BREAKFAST, Disp: 90 tablet, Rfl: 0 .  lamoTRIgine (LAMICTAL) 150 MG tablet, Take 1 tablet (150 mg total) by mouth at bedtime., Disp: 30 tablet, Rfl: 2 .  levothyroxine (SYNTHROID, LEVOTHROID) 100 MCG tablet, Take 100 mcg by mouth daily before breakfast., Disp: , Rfl:  .  losartan (COZAAR) 25 MG tablet, TAKE ONE TABLET BY MOUTH DAILY (MORNING), Disp: 90 tablet, Rfl: 2 .  meclizine (ANTIVERT) 25 MG tablet, Take 1 tablet (25 mg total) by mouth 3 (three) times daily as needed for dizziness. (Patient taking  differently: Take 25 mg by mouth daily as needed for dizziness.), Disp: 30 tablet, Rfl: 0 .  metFORMIN (GLUCOPHAGE) 500 MG tablet, TAKE ONE TABLET BY MOUTH TWICE DAILY WITH A MEAL (MORNING ,EVENING), Disp: 180 tablet, Rfl: 0 .  montelukast (SINGULAIR) 10 MG tablet, Take 10 mg by mouth daily as needed. , Disp: , Rfl:  .  ondansetron (ZOFRAN) 4 MG tablet, Take 1 tablet (4 mg total) by mouth 2 (two) times daily as needed for nausea or vomiting., Disp: 30 tablet, Rfl: 1 .  rOPINIRole (REQUIP) 3 MG tablet, TAKE ONE TABLET BY MOUTH EVERY MORNING, ,NOON AND AT BEDTIME. (PRT PT MORNING,EVENING,BEDTIME), Disp: 270 tablet, Rfl: 1 .  tizanidine (ZANAFLEX) 2 MG capsule, Take 1 capsule (2 mg total) by mouth 3 (three) times daily., Disp: 30 capsule, Rfl: 0 .  traMADol (ULTRAM) 50 MG tablet, Take 100 mg by mouth 4 (four) times daily as needed. , Disp: , Rfl:  .  traZODone (DESYREL) 150 MG tablet, Take 1 tablet (150 mg total) by mouth at bedtime., Disp: 30 tablet, Rfl: 2 .  venlafaxine XR (EFFEXOR-XR) 150 MG 24 hr capsule, Take 2 capsules (300 mg total) by mouth in the morning. (Patient taking differently: Take 300 mg by mouth at bedtime.), Disp: 60 capsule, Rfl: 2 .  EPINEPHrine 0.3 mg/0.3 mL IJ SOAJ injection, Inject 0.3 mg into the muscle once., Disp: , Rfl:  .  meloxicam (MOBIC) 7.5 MG tablet, Take 1 tablet (7.5 mg total) by mouth daily., Disp: 20 tablet, Rfl: 0   PHYSICAL EXAM SECTION: BP (!) 113/44   Pulse 82   Temp 98.3 F (36.8 C) (Temporal)   Resp 16   Ht 5' (1.524 m)   Wt 106.6 kg   SpO2 94%   BMI 45.90 kg/m   Body mass index is 45.9 kg/m.   General appearance: Well-developed well-nourished no gross deformities, other than the left index finger.  She has moderate obesity  Eyes clear normal vision no evidence of conjunctivitis or jaundice, extraocular muscles intact  ENT: ears hearing normal, nasal passages clear, throat clear   Lymph nodes: No lymphadenopathy  Neck is supple without  palpable mass, full range of motion  Cardiovascular normal pulse and perfusion in all 4 extremities normal color without edema  Neurologically deep tendon reflexes are equal and normal, no sensation loss or deficits no pathologic reflexes in the lower extremities or right upper extremity, normal sensation index finger left hand  Psychological: Awake alert and oriented x3 mood and affect normal  Skin normal in all 3 extremities except the injured extremity where there is a laceration over the middle to proximal index finger see media section for pictures with 3 sutures in place it is a jagged wound and there is erythema noted in the finger  Musculoskeletal:   Left index finger Skin as described above Tenderness over the proximal phalanx  There is some erythema is noted She has decreased range of motion swelling slight deformity  The joints are reduced on x-ray could not test due to pain  Motor she could wiggle the finger a little bit assessment difficult because of pain  9:21 AM

## 2020-04-09 NOTE — Progress Notes (Signed)
Patient Demographics:    Norma [name], is a 69 y.o. female, DOB - 1950-08-10, CNO:[id number]  Admit date - 04/09/2020   Admitting Physician Reubin Milan, MD  Outpatient Primary MD for the patient is Celene Squibb, MD  LOS - 0   Chief Complaint  Patient presents with  . Animal Bite        Subjective:    [name] today has no fevers, no emesis,  No chest pain,   Complaining of left index finger pain otherwise no new concerns  Assessment  & Plan :    Principal Problem:   Dog bite, hand, left, initial encounter Active Problems:   OSA (obstructive sleep apnea)   Essential hypertension, benign   GERD (gastroesophageal reflux disease)   Hypothyroidism   Restless legs syndrome (RLS)   Morbid obesity, unspecified obesity type (HCC)   Bipolar affective disorder (HCC)   Diabetes mellitus type 2 in obese [facility])  Brief Summary:- 69 y.o. female with medical history significant of normocytic anemia, anxiety, depression, bipolar disorder, bulging lumbar disc, chronic back pain, chronic headaches, COPD, osteoarthritis, type 2 diabetes, diabetic neuropathy, hyperlipidemia, gastroparesis, GERD, hiatal hernia, hypothyroidism, IBS, memory difficulty, morbid obesity, obstructive sleep apnea on CPAP, restless leg syndrome, history of left parietal CVA--admitted on 12/26/2021with left index finger open fracture secondary to a pitbull bite on 04/08/2020 -  A/p 1)Dog bite with open fracture left index finger--due to delayed presentation and presumed contamination/infection of the wound orthopedic surgeon recommends iv antibiotics for at least  24 hours of antibiotics to decrease bacterial load prior to open reduction internal fixation left index finger for Diagnosis open fracture left index finger proximal phalanx secondary to dog bite -Patient received tetanus shot when she was seen at [facility] ED on  04/08/2020 -Leukocytosis with a white count of 15.1 noted -Okay to continue IV Rocephin -Stop IV clindamycin -Add doxycycline to cover for Pasteurella multocida -Orthopedic consult appreciated  2)Morbid Obesity/OSA----CPAP nightly -Low calorie diet, portion control and increase physical activity discussed with patient -Body mass index is 45.9 kg/m.  3)HTN--stable, continue losartan, and carvedilol 6.25 mg twice daily  4)Chronic Anemia of CKD ---Hgb currently around 11 which is close to patient's baseline -Recent anemia work-up showed serum iron of 62 (low normal), ferritin of 10 and iron saturation of 16 (low normal) - 5) CKD 3B--- creatinine 1.4 which is close to recent baseline -- renally adjust medications, avoid nephrotoxic agents / dehydration  / hypotension  6)DM2-A1c 6.6 reflecting excellent diabetic control PTA -Hold Metformin and glipizide , Use Novolog/Humalog Sliding scale insulin with Accu-Cheks/Fingersticks as ordered    7) bipolar affective disorder--- continue PTA meds, overall stable  8) hypothyroidism--- stable, continue levothyroxine 100 mcg daily  Disposition/Need for in-Hospital Stay- patient unable to be discharged at this time due to -presumed contaminated/infected dog bite of the left index finger requiring IV antibiotics prior to ORIF  Disposition: The patient is from: Home              Anticipated d/c is to: Home              Anticipated d/c date is: 1 day              Patient currently is not  medically stable to d/c. Barriers: Not Clinically Stable-   Code Status :  -  Code Status: Full Code   Family Communication:    NA (patient is alert, awake and coherent)   Consults  :  ortho  DVT Prophylaxis  :   - SCDs   Place and maintain sequential compression device Start: 04/09/20 1217 Place TED hose Start: 04/09/20 1217    Lab Results  Component Value Date   PLT 400 04/09/2020    Inpatient Medications  Scheduled Meds: . aspirin EC  81 mg Oral  Daily  . baclofen  10 mg Oral QHS  . buPROPion  300 mg Oral q morning - 10a  . carvedilol  6.25 mg Oral BID WC  . famotidine  20 mg Oral QHS  . gabapentin  400 mg Oral TID  . insulin aspart  0-5 Units Subcutaneous QHS  . insulin aspart  0-9 Units Subcutaneous TID WC  . insulin aspart  3 Units Subcutaneous TID WC  . lamoTRIgine  150 mg Oral QHS  . levothyroxine  100 mcg Oral QAC breakfast  . loratadine  10 mg Oral Daily  . losartan  25 mg Oral Daily  . pantoprazole  40 mg Oral Daily  . rOPINIRole  3 mg Oral TID  . venlafaxine XR  300 mg Oral QHS   Continuous Infusions: . [START ON 04/10/2020] cefTRIAXone (ROCEPHIN)  IV    . doxycycline (VIBRAMYCIN) IV     PRN Meds:.ALPRAZolam, calcium carbonate, diphenhydrAMINE, fentaNYL (SUBLIMAZE) injection, meclizine, montelukast, ondansetron **OR** ondansetron (ZOFRAN) IV    Anti-infectives (From admission, onward)   Start     Dose/Rate Route Frequency Ordered Stop   04/10/20 0500  cefTRIAXone (ROCEPHIN) 2 g in sodium chloride 0.9 % 100 mL IVPB        2 g 200 mL/hr over 30 Minutes Intravenous Every 24 hours 04/09/20 0625     04/09/20 1300  doxycycline (VIBRAMYCIN) 100 mg in sodium chloride 0.9 % 250 mL IVPB        100 mg 125 mL/hr over 120 Minutes Intravenous Every 12 hours 04/09/20 1148     04/09/20 1200  clindamycin (CLEOCIN) IVPB 900 mg  Status:  Discontinued        900 mg 100 mL/hr over 30 Minutes Intravenous Every 8 hours 04/09/20 0625 04/09/20 1148   04/09/20 0630  cefTRIAXone (ROCEPHIN) 1 g in sodium chloride 0.9 % 100 mL IVPB        1 g 200 mL/hr over 30 Minutes Intravenous  Once 04/09/20 0625 04/09/20 0729   04/09/20 0415  cefTRIAXone (ROCEPHIN) 1 g in sodium chloride 0.9 % 100 mL IVPB        1 g 200 mL/hr over 30 Minutes Intravenous  Once 04/09/20 0407 04/09/20 0518   04/09/20 0415  clindamycin (CLEOCIN) IVPB 600 mg        600 mg 100 mL/hr over 30 Minutes Intravenous  Once 04/09/20 0407 04/09/20 0548   04/09/20 0400   piperacillin-tazobactam (ZOSYN) IVPB 3.375 g  Status:  Discontinued        3.375 g 100 mL/hr over 30 Minutes Intravenous  Once 04/09/20 0351 04/09/20 0500        Objective:   Vitals:   04/09/20 0900 04/09/20 0915 04/09/20 0930 04/09/20 1005  BP: (!) 101/52  (!) 104/59 (!) 104/59  Pulse: 79 81  79  Resp:    16  Temp:      TempSrc:      SpO2: 95%  95%  95%  Weight:      Height:        Wt Readings from Last 3 Encounters:  04/08/20 106.6 kg  03/15/20 106.6 kg  12/31/19 102.5 kg     Intake/Output Summary (Last 24 hours) at 04/09/2020 1217 Last data filed at 04/09/2020 [id number] Gross per 24 hour  Intake 300 ml  Output --  Net 300 ml   Physical Exam  Gen:- Awake Alert, morbidly obese, no acute distress HEENT:- [place].AT, No sclera icterus Neck-Supple Neck,No JVD,.  Lungs-  CTAB , fair symmetrical air movement CV- S1, S2 normal, regular  Abd-  +ve B.Sounds, Abd Soft, No tenderness,   increased truncal adiposity Extremity/Skin:- No pedal edema, pedal pulses present  Psych-affect is appropriate, oriented x3 Neuro-generalized weakness, no new focal deficits, no tremors MSK-left index finger laceration-see photos in epic   Data Review:   Micro Results No results found for this or any previous visit (from the past 240 hour(s)).  Radiology Reports DG Chest 2 View  Result Date: 03/15/2020 CLINICAL DATA:  Intermittent right chest pain beginning last night. EXAM: CHEST - 2 VIEW COMPARISON:  PA and lateral chest 05/06/2018. FINDINGS: Lungs clear. Heart size normal. The patient is status post CABG. No pneumothorax or pleural fluid. No acute or focal bony abnormality. IMPRESSION: No acute disease. Electronically Signed   By: Inge Rise M.D.   On: 03/15/2020 15:50   DG Finger Index Left  Result Date: 04/09/2020 CLINICAL DATA:  Dog bite to finger EXAM: LEFT INDEX FINGER 2+V COMPARISON:  None. FINDINGS: Oblique fracture through the distal shaft of the second proximal phalanx, with  fracture displacement and overlap best seen on the lateral view. The fracture is open per report, with laceration. No opaque foreign body IMPRESSION: Displaced and angulated second proximal phalanx shaft fracture. No opaque foreign body. Electronically Signed   By: Monte Fantasia M.D.   On: 04/09/2020 04:46     CBC Recent Labs  Lab 04/09/20 0410  WBC 15.1*  HGB 10.9*  HCT 35.7*  PLT 400  MCV 94.7  MCH 28.9  MCHC 30.5  RDW 15.2  LYMPHSABS 2.4  MONOABS 1.4*  EOSABS 0.6*  BASOSABS 0.1    Chemistries  Recent Labs  Lab 04/09/20 0410 04/09/20 0654  NA 135  --   K 3.6  --   CL 95*  --   CO2 28  --   GLUCOSE 158*  --   BUN 24*  --   CREATININE 1.40*  --   CALCIUM 8.9  --   AST  --  19  ALT  --  14  ALKPHOS  --  58  BILITOT  --  0.3   ------------------------------------------------------------------------------------------------------------------ No results for input(s): CHOL, HDL, LDLCALC, TRIG, CHOLHDL, LDLDIRECT in the last 72 hours.  Lab Results  Component Value Date   HGBA1C 6.6 11/08/2019   ------------------------------------------------------------------------------------------------------------------ No results for input(s): TSH, T4TOTAL, T3FREE, THYROIDAB in the last 72 hours.  Invalid input(s): FREET3 ------------------------------------------------------------------------------------------------------------------ No results for input(s): VITAMINB12, FOLATE, FERRITIN, TIBC, IRON, RETICCTPCT in the last 72 hours.  Coagulation profile No results for input(s): INR, PROTIME in the last 168 hours.  No results for input(s): DDIMER in the last 72 hours.  Cardiac Enzymes No results for input(s): CKMB, TROPONINI, MYOGLOBIN in the last 168 hours.  Invalid input(s): CK ------------------------------------------------------------------------------------------------------------------ No results found for: BNP   Roxan Hockey M.D on 04/09/2020 at 12:17  PM  Go to [email or web address] - for contact info  [facility] -  Office  [phone]

## 2020-04-09 NOTE — H&P (Signed)
History and Physical    Norma [name] [id number] DOB: 1950-12-21 DOA: 04/09/2020  PCP: Celene Squibb, MD  Patient coming from: Home.  I have personally briefly reviewed patient's old medical records in [place]  Chief Complaint: Dog bite.  HPI: Norma [name] is a 69 y.o. female with medical history significant of normocytic anemia, anxiety, depression, bipolar disorder, bulging lumbar disc, chronic back pain, chronic headaches, COPD, osteoarthritis, type 2 diabetes, diabetic neuropathy, hyperlipidemia, gastroparesis, GERD, hiatal hernia, hypothyroidism, IBS, memory difficulty, morbid obesity, obstructive sleep apnea on CPAP, restless leg syndrome, history of left parietal CVA, urticaria who is coming to the emergency department due to left index finger pitbull dog bite that she sustained 0500 yesterday morning.  She went to the ER [place], [place] yesterday, was given IV Ancef and prescribed antibiotics, but she did not pick them up from the pharmacy.  She denies fever, chills, sore throat, dyspnea, wheezing or hemoptysis.  No chest pain, palpitations, diaphoresis, PND, orthopnea or pitting edema of the lower extremities.  Denies abdominal pain, nausea, vomiting, diarrhea, constipation, melena or hematochezia.  No dysuria, frequency hematuria.  No polyuria, polydipsia, polyphagia or blurred vision.  ED Course: Initial vital signs were temperature 98.3 F, pulse 81, respirations 20, BP 127/113 mmHg and O2 sat 94% on room air.  Due to her multiple allergies, after consulting with pharmacy, the patient was given ceftriaxone and clindamycin.  Labwork: CBC shows a white count of 15.1 with 71% neutrophils, hemoglobin 10.9 g/dL and platelets 400.  BMP shows chloride of 95 mmol/L, all other electrolytes are within expected range.  Glucose 158, BUN 24 and creatinine 1.40 mg/dL.  Imaging: Displaced second left phalanx angulated shaft fracture.  Please see images and full regular report for  further detail.  Review of Systems: As per HPI otherwise all other systems reviewed and are negative.  Past Medical History:  Diagnosis Date  . Anemia   . Anxiety   . Bipolar disorder ([place])   . Bulging lumbar disc    L3-4  . Chronic daily headache   . Chronic low back pain 09/20/2014  . COPD (chronic obstructive pulmonary disease) ([place])   . Degenerative arthritis   . Depression   . Diabetes mellitus, type II ([place])   . Diabetic neuropathy ([place]) 09/16/2018  . DM type 2 with diabetic peripheral neuropathy ([place]) 09/20/2014  . Dyslipidemia   . Dyspnea   . Gastroparesis   . GERD (gastroesophageal reflux disease)   . Heart murmur   . History of hiatal hernia   . Hypothyroidism   . IBS (irritable bowel syndrome)   . Memory difficulty 09/20/2014  . Morbid obesity ([place])   . Neuropathy   . Obstructive sleep apnea on CPAP   . Restless legs syndrome (RLS) 09/17/2012  . Stroke (cerebrum) ([place]) 05/16/2017   Left parietal  . Urticaria     Past Surgical History:  Procedure Laterality Date  . ABDOMINAL HYSTERECTOMY    . ARTHROSCOPY KNEE W/ DRILLING  06/2011   and Decemer of 2013.  [name] Carpel tunnel  1980's  . CATARACT EXTRACTION W/PHACO Right 03/21/2017   Procedure: CATARACT EXTRACTION PHACO AND INTRAOCULAR LENS PLACEMENT RIGHT EYE;  Surgeon: Baruch Goldmann, MD;  Location: [facility];  Service: Ophthalmology;  Laterality: Right;  CDE: 2.91   . CATARACT EXTRACTION W/PHACO Left 04/18/2017   Procedure: CATARACT EXTRACTION PHACO AND INTRAOCULAR LENS PLACEMENT LEFT EYE;  Surgeon: Baruch Goldmann, MD;  Location: [facility];  Service: Ophthalmology;  Laterality: Left;  left  . CHOLECYSTECTOMY    . COLONOSCOPY WITH PROPOFOL N/A 10/04/2016   Procedure: COLONOSCOPY WITH PROPOFOL;  Surgeon: Rogene Houston, MD;  Location: [facility];  Service: Endoscopy;  Laterality: N/A;  11:10  . CORONARY ARTERY BYPASS GRAFT N/A 08/29/2017   Procedure: CORONARY ARTERY BYPASS GRAFTING (CABG) x 3 WITH ENDOSCOPIC HARVESTING OF RIGHT  SAPHENOUS VEIN;  Surgeon: Ivin Poot, MD;  Location: [facility];  Service: Open Heart Surgery;  Laterality: N/A;  . ESOPHAGOGASTRODUODENOSCOPY N/A 04/25/2016   Procedure: ESOPHAGOGASTRODUODENOSCOPY (EGD);  Surgeon: Rogene Houston, MD;  [facility];  Service: Endoscopy;  Laterality: N/A;  730  . LEFT HEART CATH AND CORONARY ANGIOGRAPHY N/A 08/27/2017   Procedure: LEFT HEART CATH AND CORONARY ANGIOGRAPHY;  Surgeon: Sherren Mocha, MD;  Location: [facility];  Service: Cardiovascular::  pLAD 95% - p-mLAD 50%, ostD1 90% -pD1 80%; ostOM1 90%; rPDA 80%. EF ~50-55% - HK of dital Anterolateral & Apical wall.  - Rec CVTS c/s  . RECTAL SURGERY     fissure  . SHOULDER SURGERY Left    arthroscopy in March of this year  . TEE WITHOUT CARDIOVERSION N/A 08/29/2017   Procedure: TRANSESOPHAGEAL ECHOCARDIOGRAM (TEE);  Surgeon: Prescott Gum, Collier Salina, MD;  Location: [facility];  Service: Open Heart Surgery;  Laterality: N/A;  . TRANSTHORACIC ECHOCARDIOGRAM  06/06/2017   Mild to moderate reduced EF 40 and 45%.  Anterior septal, inferoseptal and basal to mid inferior hypokinesis.  GR 1 DD.  No significant valvular lesion    Social History  reports that she quit smoking about 49 years ago. Her smoking use included cigarettes. She has never used smokeless tobacco. She reports that she does not drink alcohol and does not use drugs.  Allergies  Allergen Reactions  . Amoxicillin Anaphylaxis and Other (See Comments)    Has patient had a PCN reaction causing immediate rash, facial/tongue/throat swelling, SOB or lightheadedness with hypotension: Yes Has patient had a PCN reaction causing severe rash involving mucus membranes or skin necrosis: Yes Has patient had a PCN reaction that required hospitalization: Yes Has patient had a PCN reaction occurring within the last 10 years: Yes If all of the above answers are "NO", then may proceed with Cephalosporin use.   [name] Hydrocodone Anaphylaxis  . Percocet  [Oxycodone-Acetaminophen] Other (See Comments)    Causes chest pain /tightness. Pressure pain around her ribs.  . Vancomycin Itching  . Depacon [Valproic Acid] Other (See Comments)    Causes falls   . Iron Nausea And Vomiting    Family History  Problem Relation Age of Onset  . Hypertension Mother   . Lymphoma Mother   . Depression Mother   . Arthritis Father   . Alcohol abuse Father   . Hypertension Sister   . Cancer Brother        kidney and lung  . Alcohol abuse Brother   . Alcohol abuse Paternal Uncle   . Alcohol abuse Paternal Grandfather   . Alcohol abuse Paternal Grandmother   . Allergic rhinitis Neg Hx   . Angioedema Neg Hx   . Asthma Neg Hx   . Atopy Neg Hx   . Eczema Neg Hx   . Immunodeficiency Neg Hx   . Urticaria Neg Hx    Prior to Admission medications   Medication Sig Start Date End Date Taking? Authorizing Provider  ALPRAZolam (XANAX) 0.25 MG tablet Take 1 tablet (0.25 mg total) by mouth 2 (two) times daily as needed for  anxiety. 02/09/20  Yes Cloria Spring, MD  aspirin EC 81 MG tablet Take 1 tablet (81 mg total) by mouth daily. 01/20/18  Yes Branch, Alphonse Guild, MD  baclofen (LIORESAL) 10 MG tablet TAKE ONE TABLET BY MOUTH AT BEDTIME. 01/04/20  Yes Kathrynn Ducking, MD  buPROPion (WELLBUTRIN XL) 300 MG 24 hr tablet Take 1 tablet (300 mg total) by mouth every morning. 02/09/20  Yes Cloria Spring, MD  calcium carbonate (TUMS - DOSED IN MG ELEMENTAL CALCIUM) 500 MG chewable tablet Chew 2 tablets by mouth as needed for indigestion or heartburn.    Yes [provider]  carvedilol (COREG) 6.25 MG tablet TAKE ONE TABLET BY MOUTH TWICE DAILY (MORNING ,EVENING) 10/06/19  Yes Branch, Alphonse Guild, MD  cetirizine (ZYRTEC) 10 MG tablet Take 1 tablet tablet 1-2 times daily. Patient taking differently: Take 10 mg by mouth daily. Take 1 tablet tablet 1-2 times daily. 04/25/19  Yes Valentina Shaggy, MD  diclofenac Sodium (VOLTAREN) 1 % GEL Apply topically 4 (four)  times daily.   Yes [provider]  diphenhydrAMINE (BENADRYL) 25 mg capsule Take 25 mg by mouth as needed.   Yes [provider]  esomeprazole (NEXIUM) 40 MG capsule Take 40 mg by mouth daily before supper.  02/09/18  Yes [provider]  Evolocumab (REPATHA SURECLICK) [id number] MG/ML SOAJ Inject 1 Dose into the skin every 14 (fourteen) days. 07/14/19  Yes Hilty, Nadean Corwin, MD  famotidine (PEPCID) 20 MG tablet Take 1 tablet (20 mg total) by mouth daily. Patient taking differently: Take 20 mg by mouth at bedtime. 04/25/19  Yes Valentina Shaggy, MD  gabapentin (NEURONTIN) 400 MG capsule Take 1 capsule (400 mg total) by mouth 3 (three) times daily. 12/27/19  Yes Kathrynn Ducking, MD  glipiZIDE (GLUCOTROL XL) 5 MG 24 hr tablet TAKE ONE TABLET BY MOUTH DAILY WITH BREAKFAST 10/14/19  Yes Nida, Marella Chimes, MD  lamoTRIgine (LAMICTAL) 150 MG tablet Take 1 tablet (150 mg total) by mouth at bedtime. 02/09/20  Yes Cloria Spring, MD  levothyroxine (SYNTHROID, LEVOTHROID) 100 MCG tablet Take 100 mcg by mouth daily before breakfast.   Yes [provider]  losartan (COZAAR) 25 MG tablet TAKE ONE TABLET BY MOUTH DAILY (MORNING) 02/29/20  Yes Branch, Alphonse Guild, MD  meclizine (ANTIVERT) 25 MG tablet Take 1 tablet (25 mg total) by mouth 3 (three) times daily as needed for dizziness. Patient taking differently: Take 25 mg by mouth daily as needed for dizziness. 03/29/14  Yes Virgel Manifold, MD  metFORMIN (GLUCOPHAGE) 500 MG tablet TAKE ONE TABLET BY MOUTH TWICE DAILY WITH A MEAL (MORNING ,EVENING) 10/14/19  Yes Nida, Marella Chimes, MD  montelukast (SINGULAIR) 10 MG tablet Take 10 mg by mouth daily as needed.  08/02/19  Yes [provider]  ondansetron (ZOFRAN) 4 MG tablet Take 1 tablet (4 mg total) by mouth 2 (two) times daily as needed for nausea or vomiting. 10/20/18  Yes Rehman, Mechele Dawley, MD  rOPINIRole (REQUIP) 3 MG tablet TAKE ONE TABLET BY MOUTH EVERY MORNING, ,NOON AND  AT BEDTIME. (PRT PT MORNING,EVENING,BEDTIME) 10/14/19  Yes Kathrynn Ducking, MD  tizanidine (ZANAFLEX) 2 MG capsule Take 1 capsule (2 mg total) by mouth 3 (three) times daily. 03/15/20  Yes Wurst, [place], PA-C  traMADol (ULTRAM) 50 MG tablet Take 100 mg by mouth 4 (four) times daily as needed.    Yes [provider]  traZODone (DESYREL) 150 MG tablet Take 1 tablet (150 mg  total) by mouth at bedtime. 02/09/20  Yes Cloria Spring, MD  venlafaxine XR (EFFEXOR-XR) 150 MG 24 hr capsule Take 2 capsules (300 mg total) by mouth in the morning. Patient taking differently: Take 300 mg by mouth at bedtime. 02/09/20  Yes Cloria Spring, MD  EPINEPHrine 0.3 mg/0.3 mL IJ SOAJ injection Inject 0.3 mg into the muscle once.    [provider]  meloxicam (MOBIC) 7.5 MG tablet Take 1 tablet (7.5 mg total) by mouth daily. 03/15/20   Lestine Box, PA-C    Physical Exam: Vitals:   04/08/20 2130 04/09/20 0100 04/09/20 0300 04/09/20 0637  BP: (!) 127/113 138/67 (!) 114/50 (!) 113/44  Pulse: 81 84 80 82  Resp: 20 16 16 16   Temp: 98.3 F (36.8 C)     TempSrc: Temporal     SpO2: 94% 100% 97% 94%  Weight:      Height:        Constitutional: NAD, calm, comfortable Eyes: PERRL, lids and conjunctivae normal ENMT: Mucous membranes are moist. Posterior pharynx clear of any exudate or lesions. Neck: normal, supple, no masses, no thyromegaly Respiratory: clear to auscultation bilaterally, no wheezing, no crackles. Normal respiratory effort. No accessory muscle use.  Cardiovascular: Regular rate and rhythm, no murmurs / rubs / gallops. No extremity edema. 2+ pedal pulses. No carotid bruits.  Abdomen: Obese, nondistended.  Bowel sounds positive.  Soft, no tenderness, no masses palpated. No hepatosplenomegaly Musculoskeletal: Severe laceration of left index finger.  Please see pictures below.  No clubbing / cyanosis. No joint deformity upper and lower extremities. Good ROM, no contractures. Normal  muscle tone.  Skin: Laceration of left index finger with surrounding edema, erythema and tenderness to palpation.  Please see picture below. Neurologic: CN 2-12 grossly intact. Sensation intact, DTR normal. Strength 5/5 in all 4.  Psychiatric: Normal judgment and insight. Alert and oriented x 3. Normal mood.        Labs on Admission: I have personally reviewed following labs and imaging studies  CBC: Recent Labs  Lab 04/09/20 0410  WBC 15.1*  NEUTROABS 10.6*  HGB 10.9*  HCT 35.7*  MCV 94.7  PLT [id number]    Basic Metabolic Panel: Recent Labs  Lab 04/09/20 0410  NA 135  K 3.6  CL 95*  CO2 28  GLUCOSE 158*  BUN 24*  CREATININE 1.40*  CALCIUM 8.9    GFR: Estimated Creatinine Clearance: 43 mL/min (A) (by C-G formula based on SCr of 1.4 mg/dL (H)).  Liver Function Tests: No results for input(s): AST, ALT, ALKPHOS, BILITOT, PROT, ALBUMIN in the last 168 hours.  Radiological Exams on Admission: DG Finger Index Left  Result Date: 04/09/2020 CLINICAL DATA:  Dog bite to finger EXAM: LEFT INDEX FINGER 2+V COMPARISON:  None. FINDINGS: Oblique fracture through the distal shaft of the second proximal phalanx, with fracture displacement and overlap best seen on the lateral view. The fracture is open per report, with laceration. No opaque foreign body IMPRESSION: Displaced and angulated second proximal phalanx shaft fracture. No opaque foreign body. Electronically Signed   By: Monte Fantasia M.D.   On: 04/09/2020 04:46    EKG: Independently reviewed.  Assessment/Plan Principal Problem:   Dog bite, hand, left, initial encounter  Observation/MedSurg. Continue local care. Ceftriaxone 2 g IVPB every 24 hours. Clindamycin 900 mg IVPB every 8 hours. Analgesics as needed. Dr. Aline Brochure to evaluate.  Active Problems:   OSA (obstructive sleep apnea) CPAP at bedtime.    Essential hypertension, benign Continue  carvedilol 6.25 mg p.o. twice daily. Continue losartan 25 mg p.o.  daily. Monitor BP, HR, renal function electrolytes.    GERD (gastroesophageal reflux disease) Continue PPI.    Hypothyroidism Continue levothyroxine 100 mcg p.o. daily.    Restless legs syndrome (RLS) Continue Requip.    Morbid obesity, unspecified obesity type ([place]) Lifestyle modifications. Follow-up with PCP.    Bipolar affective disorder (HCC) Continue Wellbutrin XL 300 mg p.o. daily. Continue venlafaxine XL 300 mg p.o. daily. Continue Lamictal 150 mg p.o. at bedtime. Follow-up with PCP/[facility] as an outpatient.    Diabetes mellitus type 2 in obese (HCC) Carbohydrate modified diet. Continue glipizide XL 5 mg p.o. daily. Continue metformin 500 mg p.o. twice daily. CBG monitoring with RI SS while in the hospital.    DVT prophylaxis: Lovenox SQ. Code Status:   Full code. Family Communication:   Disposition Plan:   Patient is from:  Home.  Anticipated DC to:  Home.  Anticipated DC date:  04/11/2020.  Anticipated DC barriers: Clinical status.  Consults called:  Orthopedic surgery (Dr. Aline Brochure) Admission status:  Observation/MedSurg.    Severity of Illness:Very high due to cellulitis secondary to a pitbulll dog bite with high risk for complicated infection and amputation of the finger.  Reubin Milan MD [facility]  How to contact the [facility] Attending or Consulting provider [place] or covering provider during after hours [place], for this patient?   1. Check the care team in [facility] and look for a) attending/consulting [facility] provider listed and b) the [facility] team listed 2. Log into [email or web address] and use [place]'s universal password to access. If you do not have the password, please contact the hospital operator. 3. Locate the [facility] provider you are looking for under [facility] and page to a number that you can be directly reached. 4. If you still have difficulty reaching the provider, please page the [facility] (Director on Call) for the Hospitalists listed on amion for  assistance.  04/09/2020, 7:06 AM   This document was prepared using Dragon voice recognition software and may contain some unintended transcription errors.

## 2020-04-09 NOTE — ED Notes (Signed)
Called unit [phone] to give report with no answer. Will try again.

## 2020-04-09 NOTE — H&P (View-Only) (Signed)
Norma [name]  04/09/2020  Body mass index is 45.9 kg/m.  Consult hospital Reason for consultation delayed presentation open fracture left index finger secondary to dog bite Consulting requested by Dr. Reubin Milan   ASSESSMENT AND PLAN:     69 year old female with dog bite open fracture left index finger.  Patient will have 24 hours of antibiotics to decrease bacterial load prior to open reduction internal fixation left index finger  Diagnosis open fracture left index finger proximal phalanx secondary to dog bite  Imaging 3 views left index finger: This images have been personally reviewed and my reading is: Left index finger proximal phalanx fracture with angulation and displacement  Plan:  (Rx., Inj., surg., Frx, MRI/CT, XR:2)  24 hours of IV antibiotics Close glucose monitoring Open reduction internal fixation left index finger on Monday, December 27 at 1 PM  The procedure has been fully reviewed with the patient; The risks and benefits of surgery have been discussed and explained and understood. Alternative treatment has also been reviewed, questions were encouraged and answered. The postoperative plan is also been reviewed.  The patient is at high risk for complications such as infection, stiffness of the PIP and DIP joint and even amputation this has been relayed to the patient she agrees to proceed with surgery  HISTORY SECTION :  Chief Complaint  Patient presents with  . Animal Bite   HPI The patient presents for evaluation of moderate to severe pain left index finger for 1 day associated with open fracture and laceration   The patient went to [facility] had evaluation.  She had irrigation and closure of the wound attempted closed reduction.  The patient left AMA secondary to [facility] not having an orthopedist available for consultation and according to the patient phone calls to [facility] and [facility] did not produce a bed available for  her Tuesday.   Review of Systems  Constitutional: Negative for chills and fever.  Respiratory: Positive for shortness of breath.        COPD occasional short of breath  Cardiovascular: Negative for chest pain.  Skin: Negative for itching.  All other systems reviewed and are negative.    has a past medical history of Anemia, Anxiety, Bipolar disorder ([place]), Bulging lumbar disc, Chronic daily headache, Chronic low back pain (09/20/2014), COPD (chronic obstructive pulmonary disease) ([place]), Degenerative arthritis, Depression, Diabetes mellitus, type II ([place]), Diabetic neuropathy ([place]) (09/16/2018), DM type 2 with diabetic peripheral neuropathy ([place]) (09/20/2014), Dyslipidemia, Dyspnea, Gastroparesis, GERD (gastroesophageal reflux disease), Heart murmur, History of hiatal hernia, Hypothyroidism, IBS (irritable bowel syndrome), Memory difficulty (09/20/2014), Morbid obesity ([place]), Neuropathy, Obstructive sleep apnea on CPAP, Restless legs syndrome (RLS) (09/17/2012), Stroke (cerebrum) ([place]) (05/16/2017), and Urticaria.   Past Surgical History:  Procedure Laterality Date  . ABDOMINAL HYSTERECTOMY    . ARTHROSCOPY KNEE W/ DRILLING  06/2011   and Decemer of 2013.  [name] Carpel tunnel  1980's  . CATARACT EXTRACTION W/PHACO Right 03/21/2017   Procedure: CATARACT EXTRACTION PHACO AND INTRAOCULAR LENS PLACEMENT RIGHT EYE;  Surgeon: Baruch Goldmann, MD;  Location: [facility];  Service: Ophthalmology;  Laterality: Right;  CDE: 2.91   . CATARACT EXTRACTION W/PHACO Left 04/18/2017   Procedure: CATARACT EXTRACTION PHACO AND INTRAOCULAR LENS PLACEMENT LEFT EYE;  Surgeon: Baruch Goldmann, MD;  Location: [facility];  Service: Ophthalmology;  Laterality: Left;  left  . CHOLECYSTECTOMY    . COLONOSCOPY WITH PROPOFOL N/A 10/04/2016   Procedure: COLONOSCOPY WITH PROPOFOL;  Surgeon: Rogene Houston,  MD;  Location: [facility];  Service: Endoscopy;  Laterality: N/A;  11:10  . CORONARY ARTERY BYPASS GRAFT N/A 08/29/2017   Procedure: CORONARY  ARTERY BYPASS GRAFTING (CABG) x 3 WITH ENDOSCOPIC HARVESTING OF RIGHT SAPHENOUS VEIN;  Surgeon: Ivin Poot, MD;  Location: [place];  Service: Open Heart Surgery;  Laterality: N/A;  . ESOPHAGOGASTRODUODENOSCOPY N/A 04/25/2016   Procedure: ESOPHAGOGASTRODUODENOSCOPY (EGD);  Surgeon: Rogene Houston, MD;  Location: [facility];  Service: Endoscopy;  Laterality: N/A;  730  . LEFT HEART CATH AND CORONARY ANGIOGRAPHY N/A 08/27/2017   Procedure: LEFT HEART CATH AND CORONARY ANGIOGRAPHY;  Surgeon: Sherren Mocha, MD;  Location: [place] [facility];  Service: Cardiovascular::  pLAD 95% - p-mLAD 50%, ostD1 90% -pD1 80%; ostOM1 90%; rPDA 80%. EF ~50-55% - HK of dital Anterolateral & Apical wall.  - Rec CVTS c/s  . RECTAL SURGERY     fissure  . SHOULDER SURGERY Left    arthroscopy in March of this year  . TEE WITHOUT CARDIOVERSION N/A 08/29/2017   Procedure: TRANSESOPHAGEAL ECHOCARDIOGRAM (TEE);  Surgeon: Prescott Gum, Collier Salina, MD;  Location: [place];  Service: Open Heart Surgery;  Laterality: N/A;  . TRANSTHORACIC ECHOCARDIOGRAM  06/06/2017   Mild to moderate reduced EF 40 and 45%.  Anterior septal, inferoseptal and basal to mid inferior hypokinesis.  GR 1 DD.  No significant valvular lesion    Social History   Tobacco Use  . Smoking status: Former Smoker    Types: Cigarettes    Quit date: 02/04/1971    Years since quitting: 49.2  . Smokeless tobacco: Never Used  . Tobacco comment: smoked 2 cigarettes a day  Vaping Use  . Vaping Use: Never used  Substance Use Topics  . Alcohol use: No    Alcohol/week: 0.0 standard drinks  . Drug use: No    Family History  Problem Relation Age of Onset  . Hypertension Mother   . Lymphoma Mother   . Depression Mother   . Arthritis Father   . Alcohol abuse Father   . Hypertension Sister   . Cancer Brother        kidney and lung  . Alcohol abuse Brother   . Alcohol abuse Paternal Uncle   . Alcohol abuse Paternal Grandfather   . Alcohol abuse Paternal  Grandmother   . Allergic rhinitis Neg Hx   . Angioedema Neg Hx   . Asthma Neg Hx   . Atopy Neg Hx   . Eczema Neg Hx   . Immunodeficiency Neg Hx   . Urticaria Neg Hx       Allergies  Allergen Reactions  . Amoxicillin Anaphylaxis and Other (See Comments)    Has patient had a PCN reaction causing immediate rash, facial/tongue/throat swelling, SOB or lightheadedness with hypotension: Yes Has patient had a PCN reaction causing severe rash involving mucus membranes or skin necrosis: Yes Has patient had a PCN reaction that required hospitalization: Yes Has patient had a PCN reaction occurring within the last 10 years: Yes If all of the above answers are "NO", then may proceed with Cephalosporin use.   [name] Hydrocodone Anaphylaxis  . Percocet [Oxycodone-Acetaminophen] Other (See Comments)    Causes chest pain /tightness. Pressure pain around her ribs.  . Vancomycin Itching  . Depacon [Valproic Acid] Other (See Comments)    Causes falls   . Iron Nausea And Vomiting     Current Facility-Administered Medications:  .  ALPRAZolam (XANAX) tablet 0.25 mg, 0.25 mg, Oral, BID  PRN, Reubin Milan, MD .  aspirin EC tablet 81 mg, 81 mg, Oral, Daily, Reubin Milan, MD .  baclofen (LIORESAL) tablet 10 mg, 10 mg, Oral, QHS, Reubin Milan, MD .  buPROPion (WELLBUTRIN XL) 24 hr tablet 300 mg, 300 mg, Oral, q morning - 10a, Reubin Milan, MD .  calcium carbonate (TUMS - dosed in mg elemental calcium) chewable tablet 400 mg of elemental calcium, 2 tablet, Oral, PRN, Reubin Milan, MD .  carvedilol (COREG) tablet 6.25 mg, 6.25 mg, Oral, BID WC, Reubin Milan, MD .  Derrill Memo ON 04/10/2020] cefTRIAXone (ROCEPHIN) 2 g in sodium chloride 0.9 % 100 mL IVPB, 2 g, Intravenous, Q24H, Reubin Milan, MD .  clindamycin (CLEOCIN) IVPB 900 mg, 900 mg, Intravenous, Q8H, Reubin Milan, MD .  diphenhydrAMINE (BENADRYL) capsule 25 mg, 25 mg, Oral, PRN, Reubin Milan, MD .   enoxaparin (LOVENOX) injection 40 mg, 40 mg, Subcutaneous, Q24H, Reubin Milan, MD .  famotidine (PEPCID) tablet 20 mg, 20 mg, Oral, QHS, Reubin Milan, MD .  fentaNYL (SUBLIMAZE) injection 100 mcg, 100 mcg, Intravenous, Q2H PRN, Reubin Milan, MD .  gabapentin (NEURONTIN) capsule 400 mg, 400 mg, Oral, TID, Reubin Milan, MD .  glipiZIDE (GLUCOTROL XL) 24 hr tablet 5 mg, 5 mg, Oral, Q breakfast, Reubin Milan, MD .  insulin aspart (novoLOG) injection 0-20 Units, 0-20 Units, Subcutaneous, TID WC, Reubin Milan, MD .  lamoTRIgine (LAMICTAL) tablet 150 mg, 150 mg, Oral, QHS, Reubin Milan, MD .  levothyroxine (SYNTHROID) tablet 100 mcg, 100 mcg, Oral, QAC breakfast, Reubin Milan, MD .  loratadine (CLARITIN) tablet 10 mg, 10 mg, Oral, Daily, Reubin Milan, MD .  losartan (COZAAR) tablet 25 mg, 25 mg, Oral, Daily, Reubin Milan, MD .  meclizine (ANTIVERT) tablet 25 mg, 25 mg, Oral, Daily PRN, Reubin Milan, MD .  metFORMIN (GLUCOPHAGE) tablet 500 mg, 500 mg, Oral, BID WC, Reubin Milan, MD .  montelukast (SINGULAIR) tablet 10 mg, 10 mg, Oral, Daily PRN, Reubin Milan, MD .  ondansetron [facility]) tablet 4 mg, 4 mg, Oral, Q6H PRN **OR** ondansetron (ZOFRAN) injection 4 mg, 4 mg, Intravenous, Q6H PRN, Reubin Milan, MD .  pantoprazole (PROTONIX) EC tablet 40 mg, 40 mg, Oral, Daily, Reubin Milan, MD .  rOPINIRole (REQUIP) tablet 3 mg, 3 mg, Oral, TID, Reubin Milan, MD .  venlafaxine XR [facility]) 24 hr capsule 300 mg, 300 mg, Oral, QHS, Reubin Milan, MD  Current Outpatient Medications:  .  ALPRAZolam (XANAX) 0.25 MG tablet, Take 1 tablet (0.25 mg total) by mouth 2 (two) times daily as needed for anxiety., Disp: 60 tablet, Rfl: 2 .  aspirin EC 81 MG tablet, Take 1 tablet (81 mg total) by mouth daily., Disp: 90 tablet, Rfl: 3 .  baclofen (LIORESAL) 10 MG tablet, TAKE ONE TABLET BY MOUTH AT BEDTIME.,  Disp: 30 tablet, Rfl: 6 .  buPROPion (WELLBUTRIN XL) 300 MG 24 hr tablet, Take 1 tablet (300 mg total) by mouth every morning., Disp: 30 tablet, Rfl: 2 .  calcium carbonate (TUMS - DOSED IN MG ELEMENTAL CALCIUM) 500 MG chewable tablet, Chew 2 tablets by mouth as needed for indigestion or heartburn. , Disp: , Rfl:  .  carvedilol (COREG) 6.25 MG tablet, TAKE ONE TABLET BY MOUTH TWICE DAILY (MORNING ,EVENING), Disp: 180 tablet, Rfl: 2 .  cetirizine (ZYRTEC) 10 MG tablet, Take 1 tablet tablet 1-2 times  daily. (Patient taking differently: Take 10 mg by mouth daily. Take 1 tablet tablet 1-2 times daily.), Disp: 60 tablet, Rfl: 5 .  diclofenac Sodium (VOLTAREN) 1 % GEL, Apply topically 4 (four) times daily., Disp: , Rfl:  .  diphenhydrAMINE (BENADRYL) 25 mg capsule, Take 25 mg by mouth as needed., Disp: , Rfl:  .  esomeprazole (NEXIUM) 40 MG capsule, Take 40 mg by mouth daily before supper. , Disp: , Rfl: 12 .  Evolocumab (REPATHA SURECLICK) [id number] MG/ML SOAJ, Inject 1 Dose into the skin every 14 (fourteen) days., Disp: 2 pen, Rfl: 11 .  famotidine (PEPCID) 20 MG tablet, Take 1 tablet (20 mg total) by mouth daily. (Patient taking differently: Take 20 mg by mouth at bedtime.), Disp: 30 tablet, Rfl: 5 .  gabapentin (NEURONTIN) 400 MG capsule, Take 1 capsule (400 mg total) by mouth 3 (three) times daily., Disp: 270 capsule, Rfl: 1 .  glipiZIDE (GLUCOTROL XL) 5 MG 24 hr tablet, TAKE ONE TABLET BY MOUTH DAILY WITH BREAKFAST, Disp: 90 tablet, Rfl: 0 .  lamoTRIgine (LAMICTAL) 150 MG tablet, Take 1 tablet (150 mg total) by mouth at bedtime., Disp: 30 tablet, Rfl: 2 .  levothyroxine (SYNTHROID, LEVOTHROID) 100 MCG tablet, Take 100 mcg by mouth daily before breakfast., Disp: , Rfl:  .  losartan (COZAAR) 25 MG tablet, TAKE ONE TABLET BY MOUTH DAILY (MORNING), Disp: 90 tablet, Rfl: 2 .  meclizine (ANTIVERT) 25 MG tablet, Take 1 tablet (25 mg total) by mouth 3 (three) times daily as needed for dizziness. (Patient taking  differently: Take 25 mg by mouth daily as needed for dizziness.), Disp: 30 tablet, Rfl: 0 .  metFORMIN (GLUCOPHAGE) 500 MG tablet, TAKE ONE TABLET BY MOUTH TWICE DAILY WITH A MEAL (MORNING ,EVENING), Disp: 180 tablet, Rfl: 0 .  montelukast (SINGULAIR) 10 MG tablet, Take 10 mg by mouth daily as needed. , Disp: , Rfl:  .  ondansetron (ZOFRAN) 4 MG tablet, Take 1 tablet (4 mg total) by mouth 2 (two) times daily as needed for nausea or vomiting., Disp: 30 tablet, Rfl: 1 .  rOPINIRole (REQUIP) 3 MG tablet, TAKE ONE TABLET BY MOUTH EVERY MORNING, ,NOON AND AT BEDTIME. (PRT PT MORNING,EVENING,BEDTIME), Disp: 270 tablet, Rfl: 1 .  tizanidine (ZANAFLEX) 2 MG capsule, Take 1 capsule (2 mg total) by mouth 3 (three) times daily., Disp: 30 capsule, Rfl: 0 .  traMADol (ULTRAM) 50 MG tablet, Take 100 mg by mouth 4 (four) times daily as needed. , Disp: , Rfl:  .  traZODone (DESYREL) 150 MG tablet, Take 1 tablet (150 mg total) by mouth at bedtime., Disp: 30 tablet, Rfl: 2 .  venlafaxine XR (EFFEXOR-XR) 150 MG 24 hr capsule, Take 2 capsules (300 mg total) by mouth in the morning. (Patient taking differently: Take 300 mg by mouth at bedtime.), Disp: 60 capsule, Rfl: 2 .  EPINEPHrine 0.3 mg/0.3 mL IJ SOAJ injection, Inject 0.3 mg into the muscle once., Disp: , Rfl:  .  meloxicam (MOBIC) 7.5 MG tablet, Take 1 tablet (7.5 mg total) by mouth daily., Disp: 20 tablet, Rfl: 0   PHYSICAL EXAM SECTION: BP (!) 113/44   Pulse 82   Temp 98.3 F (36.8 C) (Temporal)   Resp 16   Ht 5' (1.524 m)   Wt 106.6 kg   SpO2 94%   BMI 45.90 kg/m   Body mass index is 45.9 kg/m.   General appearance: Well-developed well-nourished no gross deformities, other than the left index finger.  She has moderate obesity  Eyes clear normal vision no evidence of conjunctivitis or jaundice, extraocular muscles intact  ENT: ears hearing normal, nasal passages clear, throat clear   Lymph nodes: No lymphadenopathy  Neck is supple without  palpable mass, full range of motion  Cardiovascular normal pulse and perfusion in all 4 extremities normal color without edema  Neurologically deep tendon reflexes are equal and normal, no sensation loss or deficits no pathologic reflexes in the lower extremities or right upper extremity, normal sensation index finger left hand  Psychological: Awake alert and oriented x3 mood and affect normal  Skin normal in all 3 extremities except the injured extremity where there is a laceration over the middle to proximal index finger see media section for pictures with 3 sutures in place it is a jagged wound and there is erythema noted in the finger  Musculoskeletal:   Left index finger Skin as described above Tenderness over the proximal phalanx  There is some erythema is noted She has decreased range of motion swelling slight deformity  The joints are reduced on x-ray could not test due to pain  Motor she could wiggle the finger a little bit assessment difficult because of pain  9:21 AM

## 2020-04-09 NOTE — ED Notes (Signed)
Pt back from CT

## 2020-04-10 ENCOUNTER — Inpatient Hospital Stay (HOSPITAL_COMMUNITY): Payer: Medicare Other | Admitting: Anesthesiology

## 2020-04-10 ENCOUNTER — Encounter (HOSPITAL_COMMUNITY): Payer: Self-pay | Admitting: Family Medicine

## 2020-04-10 ENCOUNTER — Encounter (HOSPITAL_COMMUNITY)
Admission: EM | Disposition: A | Discharge status: Home / Self Care | Payer: Self-pay | Source: Home / Self Care | Attending: Family Medicine

## 2020-04-10 ENCOUNTER — Inpatient Hospital Stay (HOSPITAL_COMMUNITY): Payer: Medicare Other

## 2020-04-10 DIAGNOSIS — L03114 Cellulitis of left upper limb: Secondary | ICD-10-CM

## 2020-04-10 DIAGNOSIS — S62611B Displaced fracture of proximal phalanx of left index finger, initial encounter for open fracture: Secondary | ICD-10-CM

## 2020-04-10 HISTORY — PX: OPEN REDUCTION INTERNAL FIXATION (ORIF) HAND: SHX5991

## 2020-04-10 LAB — BASIC METABOLIC PANEL
Anion gap: 8 (ref 5–15)
BUN: 21 mg/dL (ref 8–23)
CO2: 27 mmol/L (ref 22–32)
Calcium: 8.6 mg/dL — ABNORMAL LOW (ref 8.9–10.3)
Chloride: 102 mmol/L (ref 98–111)
Creatinine, Ser: 1.06 mg/dL — ABNORMAL HIGH (ref 0.44–1.00)
GFR, Estimated: 58 mL/min — ABNORMAL LOW (ref >60)
Glucose, Bld: 118 mg/dL — ABNORMAL HIGH (ref 70–99)
Potassium: 3.9 mmol/L (ref 3.5–5.1)
Sodium: 137 mmol/L (ref 135–145)

## 2020-04-10 LAB — HIV ANTIBODY (ROUTINE TESTING W REFLEX): HIV Screen 4th Generation wRfx: NONREACTIVE

## 2020-04-10 LAB — CBC WITH DIFFERENTIAL/PLATELET
Abs Immature Granulocytes: 0.04 10*3/uL (ref 0.00–0.07)
Basophils Absolute: 0 10*3/uL (ref 0.0–0.1)
Basophils Relative: 0 %
Eosinophils Absolute: 0.6 10*3/uL — ABNORMAL HIGH (ref 0.0–0.5)
Eosinophils Relative: 5 %
HCT: 32.6 % — ABNORMAL LOW (ref 36.0–46.0)
Hemoglobin: 10.1 g/dL — ABNORMAL LOW (ref 12.0–15.0)
Immature Granulocytes: 0 %
Lymphocytes Relative: 19 %
Lymphs Abs: 2 10*3/uL (ref 0.7–4.0)
MCH: 29 pg (ref 26.0–34.0)
MCHC: 31 g/dL (ref 30.0–36.0)
MCV: 93.7 fL (ref 80.0–100.0)
Monocytes Absolute: 1 10*3/uL (ref 0.1–1.0)
Monocytes Relative: 9 %
Neutro Abs: 7.2 10*3/uL (ref 1.7–7.7)
Neutrophils Relative %: 67 %
Platelets: 333 10*3/uL (ref 150–400)
RBC: 3.48 MIL/uL — ABNORMAL LOW (ref 3.87–5.11)
RDW: 15.2 % (ref 11.5–15.5)
WBC: 10.9 10*3/uL — ABNORMAL HIGH (ref 4.0–10.5)
nRBC: 0 % (ref 0.0–0.2)

## 2020-04-10 LAB — GLUCOSE, CAPILLARY
Glucose-Capillary: 117 mg/dL — ABNORMAL HIGH (ref 70–99)
Glucose-Capillary: 127 mg/dL — ABNORMAL HIGH (ref 70–99)
Glucose-Capillary: 119 mg/dL — ABNORMAL HIGH (ref 70–99)
Glucose-Capillary: 106 mg/dL — ABNORMAL HIGH (ref 70–99)

## 2020-04-10 LAB — SURGICAL PCR SCREEN
MRSA, PCR: NEGATIVE
Staphylococcus aureus: POSITIVE — AB

## 2020-04-10 SURGERY — OPEN REDUCTION INTERNAL FIXATION (ORIF) HAND
Anesthesia: General | Site: Finger | Laterality: Left

## 2020-04-10 MED ORDER — ONDANSETRON HCL 4 MG/2ML IJ SOLN
INTRAMUSCULAR | Status: DC | PRN
  Administered 2020-04-10: 4 mg via INTRAVENOUS

## 2020-04-10 MED ORDER — SODIUM CHLORIDE 0.9 % IR SOLN
Status: DC | PRN
  Administered 2020-04-10: 1000 mL

## 2020-04-10 MED ORDER — BUPIVACAINE HCL (PF) 0.5 % IJ SOLN
INTRAMUSCULAR | Status: AC
  Filled 2020-04-10: qty 30

## 2020-04-10 MED ORDER — ORAL CARE MOUTH RINSE: 15.0000 mL | Freq: Once | OROMUCOSAL | Status: DC

## 2020-04-10 MED ORDER — PROPOFOL 10 MG/ML IV BOLUS
INTRAVENOUS | Status: DC | PRN
  Administered 2020-04-10: 150 mg via INTRAVENOUS

## 2020-04-10 MED ORDER — PROPOFOL 10 MG/ML IV BOLUS
INTRAVENOUS | Status: AC
  Filled 2020-04-10: qty 20

## 2020-04-10 MED ORDER — LACTATED RINGERS IV SOLN: INTRAVENOUS | Status: DC | PRN

## 2020-04-10 MED ORDER — FENTANYL CITRATE (PF) 100 MCG/2ML IJ SOLN
INTRAMUSCULAR | Status: AC
  Filled 2020-04-10: qty 2

## 2020-04-10 MED ORDER — FENTANYL CITRATE (PF) 100 MCG/2ML IJ SOLN
25.0000 ug | INTRAMUSCULAR | Status: DC | PRN
  Administered 2020-04-10: 50 ug via INTRAVENOUS

## 2020-04-10 MED ORDER — POVIDONE-IODINE 10 % EX SWAB: 2.0000 "application " | Freq: Once | CUTANEOUS | Status: DC

## 2020-04-10 MED ORDER — LIDOCAINE HCL (PF) 1 % IJ SOLN
INTRAMUSCULAR | Status: DC | PRN
  Administered 2020-04-10: 10 mL

## 2020-04-10 MED ORDER — EPHEDRINE 5 MG/ML INJ
INTRAVENOUS | Status: AC
  Filled 2020-04-10: qty 10

## 2020-04-10 MED ORDER — POLYETHYLENE GLYCOL 3350 17 G PO PACK: 17.0000 g | PACK | Freq: Every day | ORAL | Status: DC | PRN

## 2020-04-10 MED ORDER — CHLORHEXIDINE GLUCONATE CLOTH 2 % EX PADS
6.0000 | MEDICATED_PAD | Freq: Every day | CUTANEOUS | Status: DC
  Administered 2020-04-10 – 2020-04-11 (×2): 6 via TOPICAL

## 2020-04-10 MED ORDER — LIDOCAINE HCL (PF) 1 % IJ SOLN
INTRAMUSCULAR | Status: AC
  Filled 2020-04-10: qty 30

## 2020-04-10 MED ORDER — FENTANYL CITRATE (PF) 100 MCG/2ML IJ SOLN
INTRAMUSCULAR | Status: DC | PRN
  Administered 2020-04-10 (×2): 50 ug via INTRAVENOUS

## 2020-04-10 MED ORDER — TRAMADOL HCL 50 MG PO TABS
50.0000 mg | ORAL_TABLET | Freq: Four times a day (QID) | ORAL | Status: DC
  Administered 2020-04-10 – 2020-04-11 (×4): 50 mg via ORAL
  Filled 2020-04-10 (×4): qty 1

## 2020-04-10 MED ORDER — MUPIROCIN 2 % EX OINT
1.0000 "application " | TOPICAL_OINTMENT | Freq: Two times a day (BID) | CUTANEOUS | Status: DC
  Administered 2020-04-10 – 2020-04-11 (×3): 1 via NASAL
  Filled 2020-04-10 (×2): qty 22

## 2020-04-10 MED ORDER — LACTATED RINGERS IV SOLN: INTRAVENOUS | Status: DC

## 2020-04-10 MED ORDER — DOCUSATE SODIUM 100 MG PO CAPS
100.0000 mg | ORAL_CAPSULE | Freq: Two times a day (BID) | ORAL | Status: DC
  Administered 2020-04-10 – 2020-04-11 (×2): 100 mg via ORAL
  Filled 2020-04-10 (×2): qty 1

## 2020-04-10 MED ORDER — ACETAMINOPHEN 500 MG PO TABS
500.0000 mg | ORAL_TABLET | Freq: Four times a day (QID) | ORAL | Status: AC
  Administered 2020-04-10 – 2020-04-11 (×4): 500 mg via ORAL
  Filled 2020-04-10 (×4): qty 1

## 2020-04-10 MED ORDER — METOCLOPRAMIDE HCL 5 MG/ML IJ SOLN: 5.0000 mg | Freq: Three times a day (TID) | INTRAMUSCULAR | Status: DC | PRN

## 2020-04-10 MED ORDER — SODIUM CHLORIDE 0.9 % IV SOLN: INTRAVENOUS | Status: AC

## 2020-04-10 MED ORDER — METOCLOPRAMIDE HCL 10 MG PO TABS: 5.0000 mg | ORAL_TABLET | Freq: Three times a day (TID) | ORAL | Status: DC | PRN

## 2020-04-10 MED ORDER — CHLORHEXIDINE GLUCONATE 0.12 % MT SOLN: 15.0000 mL | Freq: Once | OROMUCOSAL | Status: DC

## 2020-04-10 MED ORDER — ONDANSETRON HCL 4 MG/2ML IJ SOLN: 4.0000 mg | Freq: Once | INTRAMUSCULAR | Status: DC | PRN

## 2020-04-10 SURGICAL SUPPLY — 50 items
0.045mm K Wire ×4 IMPLANT
BANDAGE ELASTIC 3 LF NS (GAUZE/BANDAGES/DRESSINGS) ×2 IMPLANT
BANDAGE ESMARK 4X12 BL STRL LF (DISPOSABLE) ×1 IMPLANT
BLADE 15 SAFETY STRL DISP (BLADE) ×3 IMPLANT
BNDG CMPR 12X4 ELC STRL LF (DISPOSABLE) ×1
BNDG CMPR MED 5X3 ELC HKLP NS (GAUZE/BANDAGES/DRESSINGS) ×1
BNDG CMPR STD VLCR NS LF 5.8X3 (GAUZE/BANDAGES/DRESSINGS) ×1
BNDG COHESIVE 2X5 TAN STRL LF (GAUZE/BANDAGES/DRESSINGS) ×3 IMPLANT
BNDG CONFORM 2 STRL LF (GAUZE/BANDAGES/DRESSINGS) ×3 IMPLANT
BNDG CONFORM 3 STRL LF (GAUZE/BANDAGES/DRESSINGS) ×2 IMPLANT
BNDG ELASTIC 3X5.8 VLCR NS LF (GAUZE/BANDAGES/DRESSINGS) ×3 IMPLANT
BNDG ESMARK 4X12 BLUE STRL LF (DISPOSABLE) ×3
CAP PIN PROTECTOR ORTHO WHT (CAP) IMPLANT
CLOTH BEACON ORANGE TIMEOUT ST (SAFETY) ×3 IMPLANT
COVER LIGHT HANDLE STERIS (MISCELLANEOUS) ×6 IMPLANT
COVER WAND RF STERILE (DRAPES) ×3 IMPLANT
CUFF TOURN SGL QUICK 18X4 (TOURNIQUET CUFF) ×3 IMPLANT
DECANTER SPIKE VIAL GLASS SM (MISCELLANEOUS) ×3 IMPLANT
DRAPE C-ARM FOLDED MOBILE STRL (DRAPES) ×3 IMPLANT
DRSG XEROFORM 1X8 (GAUZE/BANDAGES/DRESSINGS) ×2 IMPLANT
ELECT REM PT RETURN 9FT ADLT (ELECTROSURGICAL) ×3
ELECTRODE REM PT RTRN 9FT ADLT (ELECTROSURGICAL) ×1 IMPLANT
GAUZE SPONGE 4X4 12PLY STRL (GAUZE/BANDAGES/DRESSINGS) ×2 IMPLANT
GAUZE XEROFORM 1X8 LF (GAUZE/BANDAGES/DRESSINGS) ×3 IMPLANT
GLOVE BIOGEL PI IND STRL 7.0 (GLOVE) ×3 IMPLANT
GLOVE BIOGEL PI INDICATOR 7.0 (GLOVE) ×6
GLOVE SKINSENSE NS SZ8.0 LF (GLOVE) ×2
GLOVE SKINSENSE STRL SZ8.0 LF (GLOVE) ×1 IMPLANT
GLOVE SS N UNI LF 8.5 STRL (GLOVE) ×3 IMPLANT
GOWN STRL REUS W/TWL LRG LVL3 (GOWN DISPOSABLE) ×6 IMPLANT
GOWN STRL REUS W/TWL XL LVL3 (GOWN DISPOSABLE) ×3 IMPLANT
K-WIRE 229MX1.6 (WIRE) IMPLANT
K-WIRE 6 (WIRE) ×6
KIT TURNOVER KIT A (KITS) ×3 IMPLANT
KWIRE 6 (WIRE) IMPLANT
MANIFOLD NEPTUNE II (INSTRUMENTS) ×3 IMPLANT
NDL HYPO 21X1.5 SAFETY (NEEDLE) ×1 IMPLANT
NEEDLE HYPO 21X1.5 SAFETY (NEEDLE) ×3 IMPLANT
NS IRRIG 1000ML POUR BTL (IV SOLUTION) ×3 IMPLANT
PACK BASIC LIMB (CUSTOM PROCEDURE TRAY) ×3 IMPLANT
PIN CAPS ORTHO GREEN .062 (PIN) IMPLANT
SET BASIN LINEN APH (SET/KITS/TRAYS/PACK) ×3 IMPLANT
SPONGE GAUZE 2X2 8PLY STER LF (GAUZE/BANDAGES/DRESSINGS) ×1
SPONGE GAUZE 2X2 8PLY STRL LF (GAUZE/BANDAGES/DRESSINGS) ×2 IMPLANT
SUT ETHILON 3 0 FSL (SUTURE) ×4 IMPLANT
SUT MON AB 0 CT1 (SUTURE) IMPLANT
SUT MON AB 2-0 SH 27 (SUTURE)
SUT MON AB 2-0 SH27 (SUTURE) IMPLANT
SYR 10ML LL (SYRINGE) ×3 IMPLANT
SYR BULB IRRIG 60ML STRL (SYRINGE) ×3 IMPLANT

## 2020-04-10 NOTE — Interval H&P Note (Signed)
History and Physical Interval Note:  04/10/2020 1:36 PM  Norma [name]  has presented today for surgery, with the diagnosis of open fracture left index finger proximal phalanx.  The various methods of treatment have been discussed with the patient and family. After consideration of risks, benefits and other options for treatment, the patient has consented to  Procedure(s) with comments: OPEN REDUCTION INTERNAL FIXATION (ORIF) HAND, left index finger (Left) - 0.045 k wires as a surgical intervention.  The patient's history has been reviewed, patient examined, no change in status, stable for surgery.  I have reviewed the patient's chart and labs.  Questions were answered to the patient's satisfaction.     Arther Abbott

## 2020-04-10 NOTE — Progress Notes (Signed)
Set CPAP at 17 cm as per home setting given by patient. Patient calls at 0100 claming machine makes to much noise. Re adjusted mask , turned pressure to 14 . Hopefully this will satisfy patient but it is doubtful.

## 2020-04-10 NOTE — Op Note (Signed)
04/10/2020  2:55 PM  PATIENT:  Norma [name]  69 y.o. female   69-year-old female bit by a pit bull sustaining open fracture to the left index finger presented to us in delayed presentation with cellulitis  PRE-OPERATIVE DIAGNOSIS:  open fracture left index finger proximal phalanx  POST-OPERATIVE DIAGNOSIS:  open fracture left index finger proximal phalanx  PROCEDURE:  Procedure(s) with comments: OPEN REDUCTION INTERNAL FIXATION (ORIF) HAND, left index finger (Left) - 0.045 k wires   Implants 2, 0.45 K wires in the proximal phalanx  Findings the extensor tendon and flexor tendons were intact.  The fracture was oblique towards the end of the index finger proximal phalanx.  Surgery was done as follows  The patient was seen in preop properly identified surgical site marked patient negative surgery.  General anesthesia with LMA  Sterile prep and drape with Betadine  Timeout completed  The limb was exsanguinated with Esmarch tourniquet elevated 220 mmHg  The fracture was reduced with traction confirmed by x-ray  The incision was made on the thumb side of the index finger extended from the primarily transverse laceration from the dog bite just proximal to the PIP joint.  Full-thickness skin incision preservation of neurovascular structures.  The fracture was opened up and debrided extensor tendon was found intact as was the flexor tendon.  After thorough debridement the fracture was again reduced with traction and held with a clamp  It took several K wire passes in multiple planes to get the fracture reduced and stabilized but we were successful with 2 K wires.  Radiographs confirmed our reduction  The wound was irrigated and closed with 3-0 nylon sutures in interrupted fashion followed by digital block  Sterile dressings were applied after the tourniquet was released  Postop plan  Dressing change tomorrow.  She can start active range of motion of the PIP and DIP joints as  tolerated after dressing change.  She can be discharged tomorrow after I change the dressing on clindamycin.  Sutures out in 2 weeks at which time we will take x-rays   SURGEON:  Surgeon(s) and Role:    * Kassidie Hendriks E, MD - Primary  PHYSICIAN ASSISTANT:   ASSISTANTS: none   ANESTHESIA:   general  EBL:  2 mL   BLOOD ADMINISTERED:none  DRAINS: none   LOCAL MEDICATIONS USED:  LIDOCAINE   SPECIMEN:  No Specimen  DISPOSITION OF SPECIMEN:  N/A  COUNTS:  YES  TOURNIQUET:   Total Tourniquet Time Documented: Upper Arm (Left) - 49 minutes Total: Upper Arm (Left) - 49 minutes   DICTATION: .Dragon Dictation  PLAN OF CARE: Admit to inpatient   PATIENT DISPOSITION:  PACU - hemodynamically stable.   Delay start of Pharmacological VTE agent (>24hrs) due to surgical blood loss or risk of bleeding: not applicable  

## 2020-04-10 NOTE — Brief Op Note (Signed)
04/10/2020  2:55 PM  PATIENT:  Norma [name]  69 y.o. female   70 year old female bit by a pit bull sustaining open fracture to the left index finger presented to [place] in delayed presentation with cellulitis  PRE-OPERATIVE DIAGNOSIS:  open fracture left index finger proximal phalanx  POST-OPERATIVE DIAGNOSIS:  open fracture left index finger proximal phalanx  PROCEDURE:  Procedure(s) with comments: OPEN REDUCTION INTERNAL FIXATION (ORIF) HAND, left index finger (Left) - 0.045 k wires   Implants 2, 0.45 K wires in the proximal phalanx  Findings the extensor tendon and flexor tendons were intact.  The fracture was oblique towards the end of the index finger proximal phalanx.  Surgery was done as follows  The patient was seen in preop properly identified surgical site marked patient negative surgery.  General anesthesia with LMA  Sterile prep and drape with Betadine  Timeout completed  The limb was exsanguinated with Esmarch tourniquet elevated 220 mmHg  The fracture was reduced with traction confirmed by x-ray  The incision was made on the thumb side of the index finger extended from the primarily transverse laceration from the dog bite just proximal to the PIP joint.  Full-thickness skin incision preservation of neurovascular structures.  The fracture was opened up and debrided extensor tendon was found intact as was the flexor tendon.  After thorough debridement the fracture was again reduced with traction and held with a clamp  It took several K wire passes in multiple planes to get the fracture reduced and stabilized but we were successful with 2 K wires.  Radiographs confirmed our reduction  The wound was irrigated and closed with 3-0 nylon sutures in interrupted fashion followed by digital block  Sterile dressings were applied after the tourniquet was released  Postop plan  Dressing change tomorrow.  She can start active range of motion of the PIP and DIP joints as  tolerated after dressing change.  She can be discharged tomorrow after I change the dressing on clindamycin.  Sutures out in 2 weeks at which time we will take x-rays   SURGEON:  Surgeon(s) and Role:    * Vickki Hearing, MD - Primary  PHYSICIAN ASSISTANT:   ASSISTANTS: none   ANESTHESIA:   general  EBL:  2 mL   BLOOD ADMINISTERED:none  DRAINS: none   LOCAL MEDICATIONS USED:  LIDOCAINE   SPECIMEN:  No Specimen  DISPOSITION OF SPECIMEN:  N/A  COUNTS:  YES  TOURNIQUET:   Total Tourniquet Time Documented: Upper Arm (Left) - 49 minutes Total: Upper Arm (Left) - 49 minutes   DICTATION: .Reubin Milan Dictation  PLAN OF CARE: Admit to inpatient   PATIENT DISPOSITION:  PACU - hemodynamically stable.   Delay start of Pharmacological VTE agent (>24hrs) due to surgical blood loss or risk of bleeding: not applicable

## 2020-04-10 NOTE — Anesthesia Preprocedure Evaluation (Signed)
Anesthesia Evaluation  Patient identified by MRN, date of birth, ID band Patient awake    Reviewed: Allergy & Precautions, H&P , NPO status , Patient's Chart, lab work & pertinent test results, reviewed documented beta blocker date and time   Airway Mallampati: I  TM Distance: >3 FB Neck ROM: full    Dental no notable dental hx.    Pulmonary shortness of breath, sleep apnea and Continuous Positive Airway Pressure Ventilation , COPD, former smoker,    Pulmonary exam normal breath sounds clear to auscultation       Cardiovascular Exercise Tolerance: Good hypertension, On Medications + angina + CAD  + Valvular Problems/Murmurs  Rhythm:regular Rate:Normal     Neuro/Psych  Headaches, PSYCHIATRIC DISORDERS Anxiety Depression Bipolar Disorder  Neuromuscular disease CVA, Residual Symptoms    GI/Hepatic Neg liver ROS, hiatal hernia, GERD  Medicated,  Endo/Other  diabetesHypothyroidism   Renal/GU negative Renal ROS  negative genitourinary   Musculoskeletal   Abdominal   Peds  Hematology  (+) Blood dyscrasia, anemia ,   Anesthesia Other Findings   Reproductive/Obstetrics negative OB ROS                             Anesthesia Physical Anesthesia Plan  ASA: III and emergent  Anesthesia Plan: General   Post-op Pain Management:    Induction:   PONV Risk Score and Plan: Ondansetron  Airway Management Planned:   Additional Equipment:   Intra-op Plan:   Post-operative Plan:   Informed Consent: I have reviewed the patients History and Physical, chart, labs and discussed the procedure including the risks, benefits and alternatives for the proposed anesthesia with the patient or authorized representative who has indicated his/her understanding and acceptance.     Dental Advisory Given  Plan Discussed with: CRNA  Anesthesia Plan Comments:         Anesthesia Quick Evaluation

## 2020-04-10 NOTE — Transfer of Care (Signed)
Immediate Anesthesia Transfer of Care Note  Patient: Norma [name]  Procedure(s) Performed: OPEN REDUCTION INTERNAL FIXATION (ORIF) HAND, left index finger (Left Finger)  Patient Location: PACU  Anesthesia Type:General  Level of Consciousness: drowsy  Airway & Oxygen Therapy: Patient Spontanous Breathing  Post-op Assessment: Report given to RN and Patient moving all extremities  Post vital signs: Reviewed and stable  Last Vitals:  Vitals Value Taken Time  BP 118/59 04/10/20 1502  Temp    Pulse 65 04/10/20 1503  Resp 17 04/10/20 1503  SpO2 80 % 04/10/20 1503  Vitals shown include unvalidated device data.  Last Pain:  Vitals:   04/10/20 1000  TempSrc:   PainSc: 0-No pain         Complications: No complications documented.

## 2020-04-10 NOTE — Progress Notes (Signed)
Patient Demographics:    Norma [name], is a 69 y.o. female, DOB - 1951-01-23, MD:[id number]  Admit date - 04/09/2020   Admitting Physician Missey Hasley Denton Brick, MD  Outpatient Primary MD for the patient is Celene Squibb, MD  LOS - 1   Chief Complaint  Patient presents with  . Animal Bite        Subjective:    Norma [name] today has no fevers, no emesis,  No chest pain,   -Doing okay postop,  orthopedic surgeon request inpatient status for continuing IV antibiotics through 04/11/2020 with plans to do dressing change on 04/11/2020 and possibly discharge at that time  Assessment  & Plan :    Principal Problem:   Dog bite, hand, left, initial encounter Active Problems:   OSA (obstructive sleep apnea)   Essential hypertension, benign   GERD (gastroesophageal reflux disease)   Hypothyroidism   Restless legs syndrome (RLS)   Morbid obesity, unspecified obesity type (HCC)   Bipolar affective disorder ([place])   Diabetes mellitus type 2 in obese ([place])   Dog bite   Open displaced fracture of proximal phalanx of left index finger   Cellulitis of left hand  Brief Summary:- 69 y.o. female with medical history significant of normocytic anemia, anxiety, depression, bipolar disorder, bulging lumbar disc, chronic back pain, chronic headaches, COPD, osteoarthritis, type 2 diabetes, diabetic neuropathy, hyperlipidemia, gastroparesis, GERD, hiatal hernia, hypothyroidism, IBS, memory difficulty, morbid obesity, obstructive sleep apnea on CPAP, restless leg syndrome, history of left parietal CVA--admitted on 12/26/2021with left index finger open fracture secondary to a pitbull bite on 04/08/2020 -  A/p 1)Dog bite with open fracture left index finger--due to delayed presentation and presumed contamination/infection of the wound orthopedic surgeon recommends iv antibiotics for at least  24 hours of antibiotics to  decrease bacterial load prior to open reduction internal fixation left index finger for Diagnosis open fracture left index finger proximal phalanx secondary to dog bite -Patient received tetanus shot when she was seen at [facility] ED on 04/08/2020 -Leukocytosis with a white count of 15.1 noted -Okay to continue IV Rocephin  -Added doxycycline to cover for Pasteurella multocida -Orthopedic consult appreciated -orthopedic surgeon request inpatient status for continuing IV antibiotics through 04/11/2020 with plans to do dressing change on 04/11/2020 and possibly discharge at that time  2)Morbid Obesity/OSA----CPAP nightly -Low calorie diet, portion control and increase physical activity discussed with patient -Body mass index is 45.9 kg/m.  3)HTN--stable, continue losartan, and carvedilol 6.25 mg twice daily  4)Chronic Anemia of CKD ---Hgb currently around 11 which is close to patient's baseline -Recent anemia work-up showed serum iron of 62 (low normal), ferritin of 10 and iron saturation of 16 (low normal) - 5) CKD 3B--- creatinine down to 1.0 from 1.4  -- renally adjust medications, avoid nephrotoxic agents / dehydration  / hypotension  6)DM2-A1c 6.6 reflecting excellent diabetic control PTA -Hold Metformin and glipizide , Use Novolog/Humalog Sliding scale insulin with Accu-Cheks/Fingersticks as ordered   7) bipolar affective disorder--- continue PTA meds, overall stable  8) hypothyroidism--- stable, continue levothyroxine 100 mcg daily  Disposition/Need for in-Hospital Stay- patient unable to be discharged at this time due to -presumed contaminated/infected dog bite of the left index finger requiring IV antibiotics--orthopedic surgeon request inpatient  status for continuing IV antibiotics through 04/11/2020 with plans to do dressing change on 04/11/2020 and possibly discharge at that time  Disposition: The patient is from: Home              Anticipated d/c is to: Home               Anticipated d/c date is: 1 day              Patient currently is not medically stable to d/c. Barriers: Not Clinically Stable-   Code Status :  -  Code Status: Full Code   Family Communication:    NA (patient is alert, awake and coherent)   Consults  :  Ortho  Procedure:- PROCEDURE:  Procedure(s) with comments: OPEN REDUCTION INTERNAL FIXATION (ORIF) HAND, left index finger (Left) - 0.045 k wires Implants 2, 0.45 K wires in the proximal phalanx  DVT Prophylaxis  :   - SCDs   Place and maintain sequential compression device Start: 04/09/20 1217 Place TED hose Start: 04/09/20 1217   Lab Results  Component Value Date   PLT 333 04/10/2020    Inpatient Medications  Scheduled Meds: . [MAR Hold] aspirin EC  81 mg Oral Daily  . [MAR Hold] baclofen  10 mg Oral QHS  . [MAR Hold] buPROPion  300 mg Oral q morning - 10a  . [MAR Hold] carvedilol  6.25 mg Oral BID WC  . chlorhexidine  15 mL Mouth/Throat Once   Or  . mouth rinse  15 mL Mouth Rinse Once  . [MAR Hold] Chlorhexidine Gluconate Cloth  6 each Topical Q0600  . [MAR Hold] famotidine  20 mg Oral QHS  . [MAR Hold] gabapentin  400 mg Oral TID  . [MAR Hold] insulin aspart  0-5 Units Subcutaneous QHS  . [MAR Hold] insulin aspart  0-9 Units Subcutaneous TID WC  . [MAR Hold] insulin aspart  3 Units Subcutaneous TID WC  . [MAR Hold] lamoTRIgine  150 mg Oral QHS  . [MAR Hold] levothyroxine  100 mcg Oral QAC breakfast  . [MAR Hold] loratadine  10 mg Oral Daily  . [MAR Hold] losartan  25 mg Oral Daily  . [MAR Hold] mupirocin ointment  1 application Nasal BID  . [MAR Hold] pantoprazole  40 mg Oral Daily  . povidone-iodine  2 application Topical Once  . [MAR Hold] rOPINIRole  3 mg Oral TID  . [MAR Hold] venlafaxine XR  300 mg Oral QHS   Continuous Infusions: . [MAR Hold] cefTRIAXone (ROCEPHIN)  IV 2 g (04/10/20 0500)  . [MAR Hold] doxycycline (VIBRAMYCIN) IV 100 mg (04/10/20 1212)  . lactated ringers     PRN Meds:.[MAR Hold]  ALPRAZolam, [MAR Hold] calcium carbonate, [MAR Hold] diphenhydrAMINE, [MAR Hold] fentaNYL (SUBLIMAZE) injection, fentaNYL (SUBLIMAZE) injection, lidocaine (PF), [MAR Hold] meclizine, [MAR Hold] montelukast, [MAR Hold] ondansetron **OR** [MAR Hold] ondansetron (ZOFRAN) IV, ondansetron (ZOFRAN) IV, sodium chloride irrigation    Anti-infectives (From admission, onward)   Start     Dose/Rate Route Frequency Ordered Stop   04/10/20 0500  [MAR Hold]  cefTRIAXone (ROCEPHIN) 2 g in sodium chloride 0.9 % 100 mL IVPB        (MAR Hold since Mon 04/10/2020 at 1324.Hold Reason: Transfer to a Procedural area.)   2 g 200 mL/hr over 30 Minutes Intravenous Every 24 hours 04/09/20 0625     04/09/20 1300  [MAR Hold]  doxycycline (VIBRAMYCIN) 100 mg in sodium chloride 0.9 % 250 mL IVPB        (  MAR Hold since Mon 04/10/2020 at 1324.Hold Reason: Transfer to a Procedural area.)   100 mg 125 mL/hr over 120 Minutes Intravenous Every 12 hours 04/09/20 1148     04/09/20 1200  clindamycin (CLEOCIN) IVPB 900 mg  Status:  Discontinued        900 mg 100 mL/hr over 30 Minutes Intravenous Every 8 hours 04/09/20 0625 04/09/20 1148   04/09/20 0630  cefTRIAXone (ROCEPHIN) 1 g in sodium chloride 0.9 % 100 mL IVPB        1 g 200 mL/hr over 30 Minutes Intravenous  Once 04/09/20 0625 04/09/20 0729   04/09/20 0415  cefTRIAXone (ROCEPHIN) 1 g in sodium chloride 0.9 % 100 mL IVPB        1 g 200 mL/hr over 30 Minutes Intravenous  Once 04/09/20 0407 04/09/20 0518   04/09/20 0415  clindamycin (CLEOCIN) IVPB 600 mg        600 mg 100 mL/hr over 30 Minutes Intravenous  Once 04/09/20 0407 04/09/20 0548   04/09/20 0400  piperacillin-tazobactam (ZOSYN) IVPB 3.375 g  Status:  Discontinued        3.375 g 100 mL/hr over 30 Minutes Intravenous  Once 04/09/20 0351 04/09/20 0500       Objective:   Vitals:   04/09/20 2321 04/10/20 0517 04/10/20 1502 04/10/20 1600  BP:  (!) 114/48 (!) 118/59 (!) 124/59  Pulse: 69 73 64   Resp: 16 20 14     Temp:  98.8 F (37.1 C) 98 F (36.7 C) 98 F (36.7 C)  TempSrc:  Oral    SpO2: 99% 93% (!) 82% 96%  Weight:      Height:        Wt Readings from Last 3 Encounters:  04/09/20 106.6 kg  03/15/20 106.6 kg  12/31/19 102.5 kg    Intake/Output Summary (Last 24 hours) at 04/10/2020 1618 Last data filed at 04/10/2020 1459 Gross per 24 hour  Intake 904.02 ml  Output 2 ml  Net 902.02 ml   Physical Exam  Gen:- Awake Alert, morbidly obese, no acute distress HEENT:- Energy.AT, No sclera icterus Neck-Supple Neck,No JVD,.  Lungs-  CTAB , fair symmetrical air movement CV- S1, S2 normal, regular  Abd-  +ve B.Sounds, Abd Soft, No tenderness,   increased truncal adiposity Extremity/Skin:- No pedal edema, pedal pulses present  Psych-affect is appropriate, oriented x3 Neuro-generalized weakness, no new focal deficits, no tremors MSK-left index finger laceration-see photos in epic   Data Review:   Micro Results Recent Results (from the past 240 hour(s))  Resp panel by RT-PCR (RSV, Flu A&B, Covid) Nasopharyngeal Swab     Status: None   Collection Time: 04/09/20 11:23 AM   Specimen: Nasopharyngeal Swab; Nasopharyngeal(NP) swabs in vial transport medium  Result Value Ref Range Status   SARS Coronavirus 2 by RT PCR NEGATIVE NEGATIVE Final    Comment: (NOTE) SARS-CoV-2 target nucleic acids are NOT DETECTED.  The SARS-CoV-2 RNA is generally detectable in upper respiratory specimens during the acute phase of infection. The lowest concentration of SARS-CoV-2 viral copies this assay can detect is 138 copies/mL. A negative result does not preclude SARS-Cov-2 infection and should not be used as the sole basis for treatment or other patient management decisions. A negative result may occur with  improper specimen collection/handling, submission of specimen other than nasopharyngeal swab, presence of viral mutation(s) within the areas targeted by this assay, and inadequate number of  viral copies(<138 copies/mL). A negative result must be combined with clinical observations,  patient history, and epidemiological information. The expected result is Negative.  Fact Sheet for Patients:  EntrepreneurPulse.com.au  Fact Sheet for Healthcare Providers:  IncredibleEmployment.be  This test is no t yet approved or cleared by the [place] FDA and  has been authorized for detection and/or diagnosis of SARS-CoV-2 by FDA under an Emergency Use Authorization (EUA). This EUA will remain  in effect (meaning this test can be used) for the duration of the COVID-19 declaration under Section 564(b)(1) of the Act, 21 U.S.C.section 360bbb-3(b)(1), unless the authorization is terminated  or revoked sooner.       Influenza A by PCR NEGATIVE NEGATIVE Final   Influenza B by PCR NEGATIVE NEGATIVE Final    Comment: (NOTE) The Xpert Xpress SARS-CoV-2/FLU/RSV plus assay is intended as an aid in the diagnosis of influenza from Nasopharyngeal swab specimens and should not be used as a sole basis for treatment. Nasal washings and aspirates are unacceptable for Xpert Xpress SARS-CoV-2/FLU/RSV testing.  Fact Sheet for Patients: EntrepreneurPulse.com.au  Fact Sheet for Healthcare Providers: IncredibleEmployment.be  This test is not yet approved or cleared by the [place] FDA and has been authorized for detection and/or diagnosis of SARS-CoV-2 by FDA under an Emergency Use Authorization (EUA). This EUA will remain in effect (meaning this test can be used) for the duration of the COVID-19 declaration under Section 564(b)(1) of the Act, 21 U.S.C. section 360bbb-3(b)(1), unless the authorization is terminated or revoked.     Resp Syncytial Virus by PCR NEGATIVE NEGATIVE Final    Comment: (NOTE) Fact Sheet for Patients: EntrepreneurPulse.com.au  Fact Sheet for Healthcare  Providers: IncredibleEmployment.be  This test is not yet approved or cleared by the [place] FDA and has been authorized for detection and/or diagnosis of SARS-CoV-2 by FDA under an Emergency Use Authorization (EUA). This EUA will remain in effect (meaning this test can be used) for the duration of the COVID-19 declaration under Section 564(b)(1) of the Act, 21 U.S.C. section 360bbb-3(b)(1), unless the authorization is terminated or revoked.  Performed at [facility], [address], [place], [place] [address]   Surgical pcr screen     Status: Abnormal   Collection Time: 04/10/20  2:15 AM   Specimen: Nasal Mucosa; Nasal Swab  Result Value Ref Range Status   MRSA, PCR NEGATIVE NEGATIVE Final   Staphylococcus aureus POSITIVE (A) NEGATIVE Final    Comment: (NOTE) The Xpert SA Assay (FDA approved for NASAL specimens in patients 63 years of age and older), is one component of a comprehensive surveillance program. It is not intended to diagnose infection nor to guide or monitor treatment. Performed at [facility], [address], [place],  [address]     Radiology Reports DG Chest 2 View  Result Date: 03/15/2020 CLINICAL DATA:  Intermittent right chest pain beginning last night. EXAM: CHEST - 2 VIEW COMPARISON:  PA and lateral chest 05/06/2018. FINDINGS: Lungs clear. Heart size normal. The patient is status post CABG. No pneumothorax or pleural fluid. No acute or focal bony abnormality. IMPRESSION: No acute disease. Electronically Signed   By: Inge Rise M.D.   On: 03/15/2020 15:50   DG Finger Index Left  Result Date: 04/10/2020 CLINICAL DATA:  LEFT finger fracture from dog bite EXAM: DG C-ARM 1-60 MIN; LEFT INDEX FINGER 2+V FLUOROSCOPY TIME:  Fluoroscopy Time:  1 minutes 26 seconds Radiation Exposure Index (if provided by the fluoroscopic device): 2.04 mGy Number of Acquired Spot Images: A total of 7 fluoroscopic spot images COMPARISON:  April 09, 2020 FINDINGS: Initial image shows ulnar displacement of the distal aspect of an oblique fracture in the proximal phalanx. Subsequent image shows reapproximation of fracture fragments with anatomic alignment. Soft tissue defect compatible with given history of injury secondary to dog bite is seen along the margins of the finger, index finger of the LEFT hand. K-wires are placed over subsequent images showing anatomic alignment on both AP and lateral views. K-wires extend to the proximal end of the proximal phalanx. IMPRESSION: Post ORIF of an oblique fracture of the proximal phalanx of the LEFT index finger with anatomic alignment post K-wire placement. Signs of laceration overlying the fracture site as seen on the previous exam. Electronically Signed   By: Zetta Bills M.D.   On: 04/10/2020 15:06   DG Finger Index Left  Result Date: 04/09/2020 CLINICAL DATA:  Dog bite to finger EXAM: LEFT INDEX FINGER 2+V COMPARISON:  None. FINDINGS: Oblique fracture through the distal shaft of the second proximal phalanx, with fracture displacement and overlap best seen on the lateral view. The fracture is open per report, with laceration. No opaque foreign body IMPRESSION: Displaced and angulated second proximal phalanx shaft fracture. No opaque foreign body. Electronically Signed   By: Monte Fantasia M.D.   On: 04/09/2020 04:46   DG C-Arm 1-60 Min  Result Date: 04/10/2020 CLINICAL DATA:  LEFT finger fracture from dog bite EXAM: DG C-ARM 1-60 MIN; LEFT INDEX FINGER 2+V FLUOROSCOPY TIME:  Fluoroscopy Time:  1 minutes 26 seconds Radiation Exposure Index (if provided by the fluoroscopic device): 2.04 mGy Number of Acquired Spot Images: A total of 7 fluoroscopic spot images COMPARISON:  April 09, 2020 FINDINGS: Initial image shows ulnar displacement of the distal aspect of an oblique fracture in the proximal phalanx. Subsequent image shows reapproximation of fracture fragments with anatomic alignment. Soft tissue  defect compatible with given history of injury secondary to dog bite is seen along the margins of the finger, index finger of the LEFT hand. K-wires are placed over subsequent images showing anatomic alignment on both AP and lateral views. K-wires extend to the proximal end of the proximal phalanx. IMPRESSION: Post ORIF of an oblique fracture of the proximal phalanx of the LEFT index finger with anatomic alignment post K-wire placement. Signs of laceration overlying the fracture site as seen on the previous exam. Electronically Signed   By: Zetta Bills M.D.   On: 04/10/2020 15:06     CBC Recent Labs  Lab 04/09/20 0410 04/10/20 0509  WBC 15.1* 10.9*  HGB 10.9* 10.1*  HCT 35.7* 32.6*  PLT 400 333  MCV 94.7 93.7  MCH 28.9 29.0  MCHC 30.5 31.0  RDW 15.2 15.2  LYMPHSABS 2.4 2.0  MONOABS 1.4* 1.0  EOSABS 0.6* 0.6*  BASOSABS 0.1 0.0    Chemistries  Recent Labs  Lab 04/09/20 0410 04/09/20 0654 04/10/20 0509  NA 135  --  137  K 3.6  --  3.9  CL 95*  --  102  CO2 28  --  27  GLUCOSE 158*  --  118*  BUN 24*  --  21  CREATININE 1.40*  --  1.06*  CALCIUM 8.9  --  8.6*  AST  --  19  --   ALT  --  14  --   ALKPHOS  --  58  --   BILITOT  --  0.3  --    ------------------------------------------------------------------------------------------------------------------ No results for input(s): CHOL, HDL, LDLCALC, TRIG, CHOLHDL, LDLDIRECT in the last 72 hours.  Lab Results  Component Value Date   HGBA1C 6.6 11/08/2019   ------------------------------------------------------------------------------------------------------------------ No results for input(s): TSH, T4TOTAL, T3FREE, THYROIDAB in the last 72 hours.  Invalid input(s): FREET3 ------------------------------------------------------------------------------------------------------------------ No results for input(s): VITAMINB12, FOLATE, FERRITIN, TIBC, IRON, RETICCTPCT in the last 72 hours.  Coagulation profile No results  for input(s): INR, PROTIME in the last 168 hours.  No results for input(s): DDIMER in the last 72 hours.  Cardiac Enzymes No results for input(s): CKMB, TROPONINI, MYOGLOBIN in the last 168 hours.  Invalid input(s): CK ------------------------------------------------------------------------------------------------------------------ No results found for: BNP   Roxan Hockey M.D on 04/10/2020 at 4:18 PM  Go to [email or web address] - for contact info  [facility] - Office  [phone]

## 2020-04-10 NOTE — Anesthesia Postprocedure Evaluation (Signed)
Anesthesia Post Note  Patient: Norma [name]  Procedure(s) Performed: OPEN REDUCTION INTERNAL FIXATION (ORIF) HAND, left index finger (Left Finger)  Patient location during evaluation: PACU Anesthesia Type: General Level of consciousness: sedated Pain management: pain level controlled Vital Signs Assessment: post-procedure vital signs reviewed and stable Respiratory status: spontaneous breathing Cardiovascular status: blood pressure returned to baseline Postop Assessment: no headache Anesthetic complications: no   No complications documented.   Last Vitals:  Vitals:   04/09/20 2321 04/10/20 0517  BP:  (!) 114/48  Pulse: 69 73  Resp: 16 20  Temp:  37.1 C  SpO2: 99% 93%    Last Pain:  Vitals:   04/10/20 1000  TempSrc:   PainSc: 0-No pain                 Louann Sjogren

## 2020-04-11 ENCOUNTER — Encounter (HOSPITAL_COMMUNITY): Payer: Self-pay | Admitting: Orthopedic Surgery

## 2020-04-11 LAB — CBC WITH DIFFERENTIAL/PLATELET
Abs Immature Granulocytes: 0.13 10*3/uL — ABNORMAL HIGH (ref 0.00–0.07)
Basophils Absolute: 0 10*3/uL (ref 0.0–0.1)
Basophils Relative: 0 %
Eosinophils Absolute: 0.5 10*3/uL (ref 0.0–0.5)
Eosinophils Relative: 4 %
HCT: 33.1 % — ABNORMAL LOW (ref 36.0–46.0)
Hemoglobin: 10.1 g/dL — ABNORMAL LOW (ref 12.0–15.0)
Immature Granulocytes: 1 %
Lymphocytes Relative: 14 %
Lymphs Abs: 1.6 10*3/uL (ref 0.7–4.0)
MCH: 28.8 pg (ref 26.0–34.0)
MCHC: 30.5 g/dL (ref 30.0–36.0)
MCV: 94.3 fL (ref 80.0–100.0)
Monocytes Absolute: 0.9 10*3/uL (ref 0.1–1.0)
Monocytes Relative: 8 %
Neutro Abs: 8.8 10*3/uL — ABNORMAL HIGH (ref 1.7–7.7)
Neutrophils Relative %: 73 %
Platelets: 352 10*3/uL (ref 150–400)
RBC: 3.51 MIL/uL — ABNORMAL LOW (ref 3.87–5.11)
RDW: 15.3 % (ref 11.5–15.5)
WBC: 11.9 10*3/uL — ABNORMAL HIGH (ref 4.0–10.5)
nRBC: 0 % (ref 0.0–0.2)

## 2020-04-11 LAB — GLUCOSE, CAPILLARY
Glucose-Capillary: 162 mg/dL — ABNORMAL HIGH (ref 70–99)
Glucose-Capillary: 121 mg/dL — ABNORMAL HIGH (ref 70–99)

## 2020-04-11 LAB — BASIC METABOLIC PANEL
Anion gap: 10 (ref 5–15)
BUN: 19 mg/dL (ref 8–23)
CO2: 24 mmol/L (ref 22–32)
Calcium: 8.7 mg/dL — ABNORMAL LOW (ref 8.9–10.3)
Chloride: 104 mmol/L (ref 98–111)
Creatinine, Ser: 1.11 mg/dL — ABNORMAL HIGH (ref 0.44–1.00)
GFR, Estimated: 54 mL/min — ABNORMAL LOW (ref >60)
Glucose, Bld: 130 mg/dL — ABNORMAL HIGH (ref 70–99)
Potassium: 4.5 mmol/L (ref 3.5–5.1)
Sodium: 138 mmol/L (ref 135–145)

## 2020-04-11 MED ORDER — CEFDINIR 300 MG PO CAPS: 300.0000 mg | ORAL_CAPSULE | Freq: Two times a day (BID) | ORAL | 0 refills | Status: AC

## 2020-04-11 MED ORDER — DOXYCYCLINE HYCLATE 100 MG PO TABS: 100.0000 mg | ORAL_TABLET | Freq: Two times a day (BID) | ORAL | 0 refills | Status: AC

## 2020-04-11 NOTE — Progress Notes (Signed)
Discharge instructions reviewed with patient by E. Eulah Pont, RN. AVS given. This RN discussed Follow-up appointments as directed, patient verbalized understanding. No complaints at time of discharge. IV removed earlier today due to malposition. Pt left floor in stable condition via w/c accompanied by nursing staff. Discharged home.

## 2020-04-11 NOTE — Discharge Summary (Signed)
Norma [name], is a 69 y.o. female  DOB Sep 09, 1950  MRN [id number].  Admission date:  04/09/2020  Admitting Physician  Kynsleigh Westendorf Denton Brick, MD  Discharge Date:  04/11/2020   Primary MD  Celene Squibb, MD  Recommendations for primary care physician for things to follow:    1)Please Take Doxycycline and Omnicef/Cefdinir antibiotic as Prescribed  2)Please follow - up with Dr Arther Abbott (orthopedic surgeon) on January 5th  Admission Diagnosis  Cellulitis of left hand [L03.114] Dog bite, hand, left, initial encounter [id number], [id number]] Dog bite [id number]] Open displaced fracture of middle phalanx of left index finger with routine healing, subsequent encounter [S62.621D]   Discharge Diagnosis  Cellulitis of left hand [id number] Dog bite, hand, left, initial encounter [id number], [id number]] Dog bite [id number]] Open displaced fracture of middle phalanx of left index finger with routine healing, subsequent encounter [S62.621D]   Principal Problem:   Dog bite, hand, left, initial encounter Active Problems:   OSA (obstructive sleep apnea)   Essential hypertension, benign   GERD (gastroesophageal reflux disease)   Hypothyroidism   Restless legs syndrome (RLS)   Morbid obesity, unspecified obesity type (HCC)   Bipolar affective disorder ([place])   Diabetes mellitus type 2 in obese ([place])   Dog bite   Open displaced fracture of proximal phalanx of left index finger   Cellulitis of left hand      Past Medical History:  Diagnosis Date  . Anemia   . Anxiety   . Bipolar disorder ([place])   . Bulging lumbar disc    L3-4  . Chronic daily headache   . Chronic low back pain 09/20/2014  . COPD (chronic obstructive pulmonary disease) ([place])   . Degenerative arthritis   . Depression   . Diabetes mellitus, type II ([place])   . Diabetic neuropathy ([place]) 09/16/2018  . DM type 2 with diabetic peripheral neuropathy  ([place]) 09/20/2014  . Dyslipidemia   . Dyspnea   . Gastroparesis   . GERD (gastroesophageal reflux disease)   . Heart murmur   . History of hiatal hernia   . Hypothyroidism   . IBS (irritable bowel syndrome)   . Memory difficulty 09/20/2014  . Morbid obesity ([place])   . Neuropathy   . Obstructive sleep apnea on CPAP   . Restless legs syndrome (RLS) 09/17/2012  . Stroke (cerebrum) ([place]) 05/16/2017   Left parietal  . Urticaria     Past Surgical History:  Procedure Laterality Date  . ABDOMINAL HYSTERECTOMY    . ARTHROSCOPY KNEE W/ DRILLING  06/2011   and Dece[place] of 2013.  [name] Carpel tunnel  1980's  . CATARACT EXTRACTION W/PHACO Right 03/21/2017   Procedure: CATARACT EXTRACTION PHACO AND INTRAOCULAR LENS PLACEMENT RIGHT EYE;  Surgeon: Baruch Goldmann, MD;  Location: [facility];  Service: Ophthalmology;  Laterality: Right;  CDE: 2.91   . CATARACT EXTRACTION W/PHACO Left 04/18/2017   Procedure: CATARACT EXTRACTION PHACO AND INTRAOCULAR LENS PLACEMENT LEFT EYE;  Surgeon: Baruch Goldmann, MD;  Location: [facility];  Service: Ophthalmology;  Laterality: Left;  left  . CHOLECYSTECTOMY    . COLONOSCOPY WITH PROPOFOL N/A 10/04/2016   Procedure: COLONOSCOPY WITH PROPOFOL;  Surgeon: Rogene Houston, MD;  Location[facility];  Service: Endoscopy;  Laterality: N/A;  11:10  . CORONARY ARTERY BYPASS GRAFT N/A 08/29/2017   Procedure: CORONARY ARTERY BYPASS GRAFTING (CABG) x 3 WITH ENDOSCOPIC HARVESTING OF RIGHT SAPHENOUS VEIN;  Surgeon: Ivin Poot, MD;  Location: [facility];  Service: Open Heart Surgery;  Laterality: N/A;  . ESOPHAGOGASTRODUODENOSCOPY N/A 04/25/2016   Procedure: ESOPHAGOGASTRODUODENOSCOPY (EGD);  Surgeon: Rogene Houston, MD;  [facility];  Service: Endoscopy;  Laterality: N/A;  730  . LEFT HEART CATH AND CORONARY ANGIOGRAPHY N/A 08/27/2017   Procedure: LEFT HEART CATH AND CORONARY ANGIOGRAPHY;  Surgeon: Sherren Mocha, MD;  Location: [facility];  Service: Cardiovascular::  pLAD 95% -  p-mLAD 50%, ostD1 90% -pD1 80%; ostOM1 90%; rPDA 80%. EF ~50-55% - HK of dital Anterolateral & Apical wall.  - Rec CVTS c/s  . RECTAL SURGERY     fissure  . SHOULDER SURGERY Left    arthroscopy in March of this year  . TEE WITHOUT CARDIOVERSION N/A 08/29/2017   Procedure: TRANSESOPHAGEAL ECHOCARDIOGRAM (TEE);  Surgeon: Prescott Gum, Collier Salina, MD;  Location: [facility];  Service: Open Heart Surgery;  Laterality: N/A;  . TRANSTHORACIC ECHOCARDIOGRAM  06/06/2017   Mild to moderate reduced EF 40 and 45%.  Anterior septal, inferoseptal and basal to mid inferior hypokinesis.  GR 1 DD.  No significant valvular lesion     HPI  from the history and physical done on the day of admission:    Chief Complaint: Dog bite.  HPI: Norma [name] is a 69 y.o. female with medical history significant of normocytic anemia, anxiety, depression, bipolar disorder, bulging lumbar disc, chronic back pain, chronic headaches, COPD, osteoarthritis, type 2 diabetes, diabetic neuropathy, hyperlipidemia, gastroparesis, GERD, hiatal hernia, hypothyroidism, IBS, memory difficulty, morbid obesity, obstructive sleep apnea on CPAP, restless leg syndrome, history of left parietal CVA, urticaria who is coming to the emergency department due to left index finger pitbull dog bite that she sustained 0500 yesterday morning.  She went to the ER [place] [place] yesterday, was given IV Ancef and prescribed antibiotics, but she did not pick them up from the pharmacy.  She denies fever, chills, sore throat, dyspnea, wheezing or hemoptysis.  No chest pain, palpitations, diaphoresis, PND, orthopnea or pitting edema of the lower extremities.  Denies abdominal pain, nausea, vomiting, diarrhea, constipation, melena or hematochezia.  No dysuria, frequency hematuria.  No polyuria, polydipsia, polyphagia or blurred vision.  ED Course: Initial vital signs were temperature 98.3 F, pulse 81, respirations 20, BP 127/113 mmHg and O2 sat 94% on room air.  Due to her  multiple allergies, after consulting with pharmacy, the patient was given ceftriaxone and clindamycin.  Labwork: CBC shows a white count of 15.1 with 71% neutrophils, hemoglobin 10.9 g/dL and platelets 400.  BMP shows chloride of 95 mmol/L, all other electrolytes are within expected range.  Glucose 158, BUN 24 and creatinine 1.40 mg/dL.  Imaging: Displaced second left phalanx angulated shaft fracture.  Please see images and full regular report for further detail.  Review of Systems: As per HPI otherwise all other systems reviewed and are negative.    Hospital Course:    Brief Summary:- 69 y.o.femalewith medical history significant ofnormocytic anemia, anxiety, depression, bipolar disorder, bulging lumbar disc, chronic back pain, chronic headaches, COPD, osteoarthritis, type 2 diabetes, diabetic neuropathy, hyperlipidemia, gastroparesis,  GERD, hiatal hernia, hypothyroidism, IBS, memory difficulty, morbid obesity, obstructive sleep apnea on CPAP, restless leg syndrome, history of left parietal CVA--admitted on 12/26/2021with left index finger open fracture secondary to a pitbull bite on 04/08/2020 -  A/p 1)Dog bite with open fracture left index finger--due to delayed presentation and presumed contamination/infection of the wound orthopedic surgeon recommended iv antibiotics for at least  24 hours of antibiotics to decrease bacterial load prior to open reduction internal fixation left index finger of open fracture left index finger proximal phalanx secondary to dog bite -Patient received tetanus shot when she was seen at [facility] ED on 04/08/2020 -Leukocytosis with a white count down to 11.9 from 15.1  Treated with IV Rocephin  -Added doxycycline to cover for Pasteurella multocida -Orthopedic consult appreciated -orthopedic surgeon request inpatient status for continuing IV antibiotics through 04/11/2020 with plans to do dressing change on 04/11/2020 and possibly discharge at that  time -Okay to discharge home on p.o. Omnicef and doxycycline--- patient is allergic to penicillin, but tolerates 3rd generation cephalosporin  2)Morbid Obesity/OSA----CPAP nightly -Low calorie diet, portion control and increase physical activity discussed with patient -Body mass index is 45.9 kg/m.  3)HTN--stable, continue losartan, and carvedilol 6.25 mg twice daily  4)Chronic Anemia of CKD ---Hgb currently > 10 which is close to patient's baseline -Recent anemia work-up showed serum iron of 62 (low normal), ferritin of 10 and iron saturation of 16 (low normal) - 5) CKD 3B--- creatinine down to 1.1 from 1.4  -- renally adjust medications, avoid nephrotoxic agents / dehydration  / hypotension  6)DM2-A1c 6.6 reflecting excellent diabetic control PTA -Hold Metformin and glipizide , Use Novolog/Humalog Sliding scale insulin with Accu-Cheks/Fingersticks as ordered   7) bipolar affective disorder--- continue PTA meds, overall stable  8) hypothyroidism--- stable, continue levothyroxine 100 mcg daily  Disposition/--home with outpatient follow-up with orthopedic surgeon   Code Status :  -  Code Status: Full Code   Family Communication:    NA (patient is alert, awake and coherent)   Consults  :  Ortho  Procedure:- PROCEDURE: Procedure(s) with comments: OPEN REDUCTION INTERNAL FIXATION (ORIF) HAND, left index finger (Left) - 0.045 k wires Implants 2, 0.45 K wires in the proximal phalanx  DVT Prophylaxis  :   - SCDs   Place and maintain sequential compression device Start: 04/09/20 1217 Place TED hose Start: 04/09/20 1217  Discharge Condition: stable  Follow UP   Follow-up Information    Vickki Hearing, MD Follow up on 04/19/2020.   Specialties: Orthopedic Surgery, Radiology Why: Office will call you with appointment time.  Contact information: [address] [place] [address] [phone]        [name], MD. Schedule an appointment as soon as  possible for a visit.   Specialty: Internal Medicine Why: as directed Contact information: [address] [place] [facility] [id number] [phone]        Antoine Poche, MD .   Specialty: Cardiology Contact information: [address] [place] [address] [phone]                Consults obtained - orthopedics  Diet and Activity recommendation:  As advised  Discharge Instructions    Discharge Instructions    Call MD for:  redness, tenderness, or signs of infection (pain, swelling, redness, odor or green/yellow discharge around incision site)   Complete by: As directed    Call MD for:  severe uncontrolled pain   Complete by: As  directed    Call MD for:  temperature >100.4   Complete by: As directed    Diet - low sodium heart healthy   Complete by: As directed    Discharge instructions   Complete by: As directed    1)Please Take Doxycycline and Omnicef/Cefdinir antibiotic as Prescribed  2)Please follow - up with Dr Arther Abbott (orthopedic surgeon) on January 5th   Discharge wound care:   Complete by: As directed    As advised by orthopedic surgeon   Increase activity slowly   Complete by: As directed         Discharge Medications     Allergies as of 04/11/2020      Reactions   Amoxicillin Anaphylaxis, Other (See Comments)   Has patient had a PCN reaction causing immediate rash, facial/tongue/throat swelling, SOB or lightheadedness with hypotension: Yes Has patient had a PCN reaction causing severe rash involving mucus membranes or skin necrosis: Yes Has patient had a PCN reaction that required hospitalization: Yes Has patient had a PCN reaction occurring within the last 10 years: Yes If all of the above answers are "NO", then may proceed with Cephalosporin use.   Hydrocodone Anaphylaxis   Percocet [oxycodone-acetaminophen] Other (See Comments)   Causes chest pain /tightness. Pressure pain around her ribs.   Vancomycin Itching   Depacon  [valproic Acid] Other (See Comments)   Causes falls    Iron Nausea And Vomiting      Medication List    STOP taking these medications   famotidine 20 MG tablet Commonly known as: Pepcid   meloxicam 7.5 MG tablet Commonly known as: Mobic     TAKE these medications   ALPRAZolam 0.25 MG tablet Commonly known as: XANAX Take 1 tablet (0.25 mg total) by mouth 2 (two) times daily as needed for anxiety.   aspirin EC 81 MG tablet Take 1 tablet (81 mg total) by mouth daily.   baclofen 10 MG tablet Commonly known as: LIORESAL TAKE ONE TABLET BY MOUTH AT BEDTIME.   buPROPion 300 MG 24 hr tablet Commonly known as: WELLBUTRIN XL Take 1 tablet (300 mg total) by mouth every morning.   calcium carbonate 500 MG chewable tablet Commonly known as: TUMS - dosed in mg elemental calcium Chew 2 tablets by mouth as needed for indigestion or heartburn.   carvedilol 6.25 MG tablet Commonly known as: COREG TAKE ONE TABLET BY MOUTH TWICE DAILY (MORNING ,EVENING)   cefdinir 300 MG capsule Commonly known as: OMNICEF Take 1 capsule (300 mg total) by mouth 2 (two) times daily for 7 days.   cetirizine 10 MG tablet Commonly known as: ZYRTEC Take 1 tablet tablet 1-2 times daily. What changed:   how much to take  how to take this  when to take this   diclofenac Sodium 1 % Gel Commonly known as: VOLTAREN Apply topically 4 (four) times daily.   diphenhydrAMINE 25 mg capsule Commonly known as: BENADRYL Take 25 mg by mouth as needed.   doxycycline 100 MG tablet Commonly known as: VIBRA-TABS Take 1 tablet (100 mg total) by mouth 2 (two) times daily for 7 days.   EPINEPHrine 0.3 mg/0.3 mL Soaj injection Commonly known as: EPI-PEN Inject 0.3 mg into the muscle once.   esomeprazole 40 MG capsule Commonly known as: NEXIUM Take 40 mg by mouth daily before supper.   gabapentin 400 MG capsule Commonly known as: Neurontin Take 1 capsule (400 mg total) by mouth 3 (three) times daily.    glipiZIDE 5  MG 24 hr tablet Commonly known as: GLUCOTROL XL TAKE ONE TABLET BY MOUTH DAILY WITH BREAKFAST   lamoTRIgine 150 MG tablet Commonly known as: LAMICTAL Take 1 tablet (150 mg total) by mouth at bedtime.   levothyroxine 100 MCG tablet Commonly known as: SYNTHROID Take 100 mcg by mouth daily before breakfast.   losartan 25 MG tablet Commonly known as: COZAAR TAKE ONE TABLET BY MOUTH DAILY (MORNING)   meclizine 25 MG tablet Commonly known as: ANTIVERT Take 1 tablet (25 mg total) by mouth 3 (three) times daily as needed for dizziness. What changed: when to take this   metFORMIN 500 MG tablet Commonly known as: GLUCOPHAGE TAKE ONE TABLET BY MOUTH TWICE DAILY WITH A MEAL (MORNING ,EVENING)   montelukast 10 MG tablet Commonly known as: SINGULAIR Take 10 mg by mouth daily as needed.   ondansetron 4 MG tablet Commonly known as: ZOFRAN Take 1 tablet (4 mg total) by mouth 2 (two) times daily as needed for nausea or vomiting.   Repatha SureClick [id number] MG/ML Soaj Generic drug: Evolocumab Inject 1 Dose into the skin every 14 (fourteen) days.   rOPINIRole 3 MG tablet Commonly known as: REQUIP TAKE ONE TABLET BY MOUTH EVERY MORNING, ,NOON AND AT BEDTIME. (PRT PT MORNING,EVENING,BEDTIME)   tizanidine 2 MG capsule Commonly known as: Zanaflex Take 1 capsule (2 mg total) by mouth 3 (three) times daily.   traMADol 50 MG tablet Commonly known as: ULTRAM Take 100 mg by mouth 4 (four) times daily as needed.   traZODone 150 MG tablet Commonly known as: DESYREL Take 1 tablet (150 mg total) by mouth at bedtime.   venlafaxine XR 150 MG 24 hr capsule Commonly known as: EFFEXOR-XR Take 2 capsules (300 mg total) by mouth in the morning. What changed: when to take this            Discharge Care Instructions  (From admission, onward)         Start     Ordered   04/11/20 0000  Discharge wound care:       Comments: As advised by orthopedic surgeon   04/11/20 1248           Major procedures and Radiology Reports - PLEASE review detailed and final reports for all details, in brief -   DG Chest 2 View  Result Date: 03/15/2020 CLINICAL DATA:  Intermittent right chest pain beginning last night. EXAM: CHEST - 2 VIEW COMPARISON:  PA and lateral chest 05/06/2018. FINDINGS: Lungs clear. Heart size normal. The patient is status post CABG. No pneumothorax or pleural fluid. No acute or focal bony abnormality. IMPRESSION: No acute disease. Electronically Signed   By: Inge Rise M.D.   On: 03/15/2020 15:50   DG Finger Index Left  Result Date: 04/10/2020 CLINICAL DATA:  LEFT finger fracture from dog bite EXAM: DG C-ARM 1-60 MIN; LEFT INDEX FINGER 2+V FLUOROSCOPY TIME:  Fluoroscopy Time:  1 minutes 26 seconds Radiation Exposure Index (if provided by the fluoroscopic device): 2.04 mGy Number of Acquired Spot Images: A total of 7 fluoroscopic spot images COMPARISON:  April 09, 2020 FINDINGS: Initial image shows ulnar displacement of the distal aspect of an oblique fracture in the proximal phalanx. Subsequent image shows reapproximation of fracture fragments with anatomic alignment. Soft tissue defect compatible with given history of injury secondary to dog bite is seen along the margins of the finger, index finger of the LEFT hand. K-wires are placed over subsequent images showing anatomic alignment on both AP and  lateral views. K-wires extend to the proximal end of the proximal phalanx. IMPRESSION: Post ORIF of an oblique fracture of the proximal phalanx of the LEFT index finger with anatomic alignment post K-wire placement. Signs of laceration overlying the fracture site as seen on the previous exam. Electronically Signed   By: Zetta Bills M.D.   On: 04/10/2020 15:06   DG Finger Index Left  Result Date: 04/09/2020 CLINICAL DATA:  Dog bite to finger EXAM: LEFT INDEX FINGER 2+V COMPARISON:  None. FINDINGS: Oblique fracture through the distal shaft of the second proximal  phalanx, with fracture displacement and overlap best seen on the lateral view. The fracture is open per report, with laceration. No opaque foreign body IMPRESSION: Displaced and angulated second proximal phalanx shaft fracture. No opaque foreign body. Electronically Signed   By: Monte Fantasia M.D.   On: 04/09/2020 04:46   DG C-Arm 1-60 Min  Result Date: 04/10/2020 CLINICAL DATA:  LEFT finger fracture from dog bite EXAM: DG C-ARM 1-60 MIN; LEFT INDEX FINGER 2+V FLUOROSCOPY TIME:  Fluoroscopy Time:  1 minutes 26 seconds Radiation Exposure Index (if provided by the fluoroscopic device): 2.04 mGy Number of Acquired Spot Images: A total of 7 fluoroscopic spot images COMPARISON:  April 09, 2020 FINDINGS: Initial image shows ulnar displacement of the distal aspect of an oblique fracture in the proximal phalanx. Subsequent image shows reapproximation of fracture fragments with anatomic alignment. Soft tissue defect compatible with given history of injury secondary to dog bite is seen along the margins of the finger, index finger of the LEFT hand. K-wires are placed over subsequent images showing anatomic alignment on both AP and lateral views. K-wires extend to the proximal end of the proximal phalanx. IMPRESSION: Post ORIF of an oblique fracture of the proximal phalanx of the LEFT index finger with anatomic alignment post K-wire placement. Signs of laceration overlying the fracture site as seen on the previous exam. Electronically Signed   By: Zetta Bills M.D.   On: 04/10/2020 15:06    Micro Results    Recent Results (from the past 240 hour(s))  Resp panel by RT-PCR (RSV, Flu A&B, Covid) Nasopharyngeal Swab     Status: None   Collection Time: 04/09/20 11:23 AM   Specimen: Nasopharyngeal Swab; Nasopharyngeal(NP) swabs in vial transport medium  Result Value Ref Range Status   SARS Coronavirus 2 by RT PCR NEGATIVE NEGATIVE Final    Comment: (NOTE) SARS-CoV-2 target nucleic acids are NOT  DETECTED.  The SARS-CoV-2 RNA is generally detectable in upper respiratory specimens during the acute phase of infection. The lowest concentration of SARS-CoV-2 viral copies this assay can detect is 138 copies/mL. A negative result does not preclude SARS-Cov-2 infection and should not be used as the sole basis for treatment or other patient management decisions. A negative result may occur with  improper specimen collection/handling, submission of specimen other than nasopharyngeal swab, presence of viral mutation(s) within the areas targeted by this assay, and inadequate number of viral copies(<138 copies/mL). A negative result must be combined with clinical observations, patient history, and epidemiological information. The expected result is Negative.  Fact Sheet for Patients:  EntrepreneurPulse.com.au  Fact Sheet for Healthcare Providers:  IncredibleEmployment.be  This test is no t yet approved or cleared by the [place] FDA and  has been authorized for detection and/or diagnosis of SARS-CoV-2 by FDA under an Emergency Use Authorization (EUA). This EUA will remain  in effect (meaning this test can be used) for the duration of the  COVID-19 declaration under Section 564(b)(1) of the Act, 21 U.S.C.section 360bbb-3(b)(1), unless the authorization is terminated  or revoked sooner.       Influenza A by PCR NEGATIVE NEGATIVE Final   Influenza B by PCR NEGATIVE NEGATIVE Final    Comment: (NOTE) The Xpert Xpress SARS-CoV-2/FLU/RSV plus assay is intended as an aid in the diagnosis of influenza from Nasopharyngeal swab specimens and should not be used as a sole basis for treatment. Nasal washings and aspirates are unacceptable for Xpert Xpress SARS-CoV-2/FLU/RSV testing.  Fact Sheet for Patients: EntrepreneurPulse.com.au  Fact Sheet for Healthcare Providers: IncredibleEmployment.be  This test is not yet  approved or cleared by the [place] FDA and has been authorized for detection and/or diagnosis of SARS-CoV-2 by FDA under an Emergency Use Authorization (EUA). This EUA will remain in effect (meaning this test can be used) for the duration of the COVID-19 declaration under Section 564(b)(1) of the Act, 21 U.S.C. section 360bbb-3(b)(1), unless the authorization is terminated or revoked.     Resp Syncytial Virus by PCR NEGATIVE NEGATIVE Final    Comment: (NOTE) Fact Sheet for Patients: EntrepreneurPulse.com.au  Fact Sheet for Healthcare Providers: IncredibleEmployment.be  This test is not yet approved or cleared by the [place] FDA and has been authorized for detection and/or diagnosis of SARS-CoV-2 by FDA under an Emergency Use Authorization (EUA). This EUA will remain in effect (meaning this test can be used) for the duration of the COVID-19 declaration under Section 564(b)(1) of the Act, 21 U.S.C. section 360bbb-3(b)(1), unless the authorization is terminated or revoked.  Performed at [facility], [address] [place], [place] [address]   Surgical pcr screen     Status: Abnormal   Collection Time: 04/10/20  2:15 AM   Specimen: Nasal Mucosa; Nasal Swab  Result Value Ref Range Status   MRSA, PCR NEGATIVE NEGATIVE Final   Staphylococcus aureus POSITIVE (A) NEGATIVE Final    Comment: (NOTE) The Xpert SA Assay (FDA approved for NASAL specimens in patients 70 years of age and older), is one component of a comprehensive surveillance program. It is not intended to diagnose infection nor to guide or monitor treatment. Performed at [facility], [address], [place], [place] [address]    Today   Subjective    Shaunice Fosnight today has no new complaints           No Nausea, Vomiting or Diarrhea No fever  Or chills   Patient has been seen and examined prior to discharge   Objective   Blood pressure 113/60, pulse 87,  temperature 98.2 F (36.8 C), temperature source Oral, resp. rate 16, height 5' (1.524 m), weight 106.6 kg, SpO2 98 %.   Intake/Output Summary (Last 24 hours) at 04/11/2020 1249 Last data filed at 04/11/2020 0930 Gross per 24 hour  Intake 1601 ml  Output 2 ml  Net 1599 ml    Exam Gen:- Awake Alert, no acute distress  HEENT:- [place].AT, No sclera icterus Neck-Supple Neck,No JVD,.  Lungs-  CTAB , good air movement bilaterally  CV- S1, S2 normal, regular Abd-  +ve B.Sounds, Abd Soft, No tenderness,    Extremity/Skin:- No  edema,   good pulses Psych-affect is appropriate, oriented x3 Neuro-no new focal deficits, no tremors  MSK-left index finger wound with dressing--capillary refill is good   Data Review   CBC w Diff:  Lab Results  Component Value Date   WBC 11.9 (H) 04/11/2020   HGB 10.1 (L) 04/11/2020   HCT 33.1 (L)  04/11/2020   PLT 352 04/11/2020   LYMPHOPCT 14 04/11/2020   MONOPCT 8 04/11/2020   EOSPCT 4 04/11/2020   BASOPCT 0 04/11/2020    CMP:  Lab Results  Component Value Date   NA 138 04/11/2020   NA 140 04/23/2019   K 4.5 04/11/2020   CL 104 04/11/2020   CO2 24 04/11/2020   BUN 19 04/11/2020   BUN 21 11/08/2019   CREATININE 1.11 (H) 04/11/2020   CREATININE 1.10 (H) 03/05/2019   PROT 7.6 04/09/2020   PROT 6.7 04/23/2019   ALBUMIN 3.7 04/09/2020   ALBUMIN 3.7 (L) 04/23/2019   BILITOT 0.3 04/09/2020   BILITOT <0.2 04/23/2019   ALKPHOS 58 04/09/2020   AST 19 04/09/2020   ALT 14 04/09/2020  .   Total Discharge time is about 33 minutes  Shon Hale M.D on 04/11/2020 at 12:49 PM  Go to [email or web address] -  for contact info  [facility] - Office  [phone]

## 2020-04-11 NOTE — Progress Notes (Signed)
Patient has refused CPAP for tonight. 

## 2020-04-11 NOTE — Progress Notes (Signed)
Patient ID: Norma [name], female   DOB: 04-Apr-1951, 69 y.o.   MRN: [id number]  BP 113/60 (BP Location: Right Wrist)   Pulse 87   Temp 98.2 F (36.8 C) (Oral)   Resp 16   Ht 5' (1.524 m)   Wt 106.6 kg   SpO2 98%   BMI 45.90 kg/m   Dressing changed  Rec: discharge and follow up jan 5   The wound looks good there is some redness   rec dc on clindamycin

## 2020-04-11 NOTE — Discharge Instructions (Signed)
1)Please Take Doxycycline and Omnicef/Cefdinir antibiotic as Prescribed  2)Please follow - up with Dr Fuller [place] (orthopedic surgeon) on January 5th

## 2020-04-13 ENCOUNTER — Other Ambulatory Visit (INDEPENDENT_AMBULATORY_CARE_PROVIDER_SITE_OTHER): Payer: Self-pay | Admitting: *Deleted

## 2020-04-13 DIAGNOSIS — D649 Anemia, unspecified: Secondary | ICD-10-CM

## 2020-04-17 ENCOUNTER — Telehealth: Payer: Self-pay

## 2020-04-17 ENCOUNTER — Ambulatory Visit: Payer: Self-pay | Admitting: Neurology

## 2020-04-17 NOTE — Telephone Encounter (Signed)
LVM for pt to r/s due to weather

## 2020-04-19 ENCOUNTER — Ambulatory Visit: Payer: Medicare Other

## 2020-04-19 ENCOUNTER — Encounter: Payer: Self-pay | Admitting: Orthopedic Surgery

## 2020-04-19 ENCOUNTER — Other Ambulatory Visit: Payer: Self-pay

## 2020-04-19 ENCOUNTER — Ambulatory Visit (INDEPENDENT_AMBULATORY_CARE_PROVIDER_SITE_OTHER): Payer: Medicare Other | Admitting: Orthopedic Surgery

## 2020-04-19 VITALS — Ht 60.0 in | Wt 232.0 lb

## 2020-04-19 DIAGNOSIS — S62611D Displaced fracture of proximal phalanx of left index finger, subsequent encounter for fracture with routine healing: Secondary | ICD-10-CM

## 2020-04-19 MED ORDER — CEFDINIR 300 MG PO CAPS
300.0000 mg | ORAL_CAPSULE | Freq: Two times a day (BID) | ORAL | 2 refills | Status: DC
Start: 2020-04-19 — End: 2020-05-08

## 2020-04-19 MED ORDER — DOXYCYCLINE HYCLATE 100 MG PO TABS: 100.0000 mg | ORAL_TABLET | Freq: Two times a day (BID) | ORAL | 2 refills | Status: DC

## 2020-04-19 NOTE — Progress Notes (Signed)
Chief Complaint  Patient presents with  . Routine Post Op    LT hand index finger DOS 04/10/20   Postop day #8 Status post irrigation debridement open reduction internal fixation proximal phalanx fracture left hand with internal fixation  Patient has a severe penicillin allergy tolerated cephalosporins however in the hospital  Fracture reduction is maintained on x-ray today  Recommend continue antibiotics start active range of motion exercises  Repeat x-ray in 2 weeks  Sutures will need to come out in 1 week   Meds ordered this encounter  Medications  . cefdinir (OMNICEF) 300 MG capsule    Sig: Take 1 capsule (300 mg total) by mouth 2 (two) times daily.    Dispense:  14 capsule    Refill:  2  . doxycycline (VIBRA-TABS) 100 MG tablet    Sig: Take 1 tablet (100 mg total) by mouth 2 (two) times daily.    Dispense:  28 tablet    Refill:  2

## 2020-04-20 ENCOUNTER — Telehealth: Payer: Self-pay | Admitting: Orthopedic Surgery

## 2020-04-20 NOTE — Telephone Encounter (Signed)
Patient called to verify that a prescription was ordered "antibiotic" - I verified per medication list that it was ordered as discussed at yesterday's visit doxycycline (VIBRA-TABS) 100 MG tablet, at [facility]. Said she will check back with pharmacy - said could not get through when calling earlier.

## 2020-04-26 ENCOUNTER — Encounter: Payer: Self-pay | Admitting: Orthopedic Surgery

## 2020-04-26 ENCOUNTER — Ambulatory Visit (INDEPENDENT_AMBULATORY_CARE_PROVIDER_SITE_OTHER): Payer: Medicare Other | Admitting: Orthopedic Surgery

## 2020-04-26 ENCOUNTER — Other Ambulatory Visit: Payer: Self-pay

## 2020-04-26 VITALS — Ht 60.0 in | Wt 232.0 lb

## 2020-04-26 DIAGNOSIS — Z9889 Other specified postprocedural states: Secondary | ICD-10-CM

## 2020-04-26 DIAGNOSIS — Z8781 Personal history of (healed) traumatic fracture: Secondary | ICD-10-CM | POA: Insufficient documentation

## 2020-04-26 MED ORDER — SULFAMETHOXAZOLE-TRIMETHOPRIM 800-160 MG PO TABS: 1.0000 | ORAL_TABLET | Freq: Two times a day (BID) | ORAL | 2 refills | Status: DC

## 2020-04-26 MED ORDER — CLINDAMYCIN HCL 150 MG PO CAPS: 150.0000 mg | ORAL_CAPSULE | Freq: Four times a day (QID) | ORAL | 0 refills | Status: DC

## 2020-04-26 NOTE — Progress Notes (Signed)
POST OP VISIT   Chief Complaint  Patient presents with  . Hand Injury    Lt hand index finger DOI 04/10/20    Encounter Diagnosis  Name Primary?  . S/P ORIF (open reduction internal fixation) fracture left index finger 04/10/20 Yes     POV # 2  DOS 04/10/20  POD # 16  OBJECTIVE FINDINGS :   Glorya is doing okay.  She has much more erythema than I would like I would like to change her antibiotics.  She was on Omnicef and doxycycline switching her over to  Meds ordered this encounter  Medications  . sulfamethoxazole-trimethoprim (BACTRIM DS) 800-160 MG tablet    Sig: Take 1 tablet by mouth 2 (two) times daily.    Dispense:  20 tablet    Refill:  2  . clindamycin (CLEOCIN) 150 MG capsule    Sig: Take 1 capsule (150 mg total) by mouth 4 (four) times daily.    Dispense:  40 capsule    Refill:  0     ASSESSMENT AND PLAN   We removed the sutures started some active range of motion exercises for the PIP joint the DIP joint is moving fine  X-ray next week recheck wound pains can come out on week 4, 24 January or later  Chief Complaint  Patient presents with  . Hand Injury    Lt hand index finger DOI 04/10/20

## 2020-04-26 NOTE — Patient Instructions (Addendum)
Stop doxycycline  Start clindamycin and bactrim

## 2020-05-02 ENCOUNTER — Other Ambulatory Visit (HOSPITAL_COMMUNITY): Payer: Self-pay | Admitting: Psychiatry

## 2020-05-02 ENCOUNTER — Ambulatory Visit (INDEPENDENT_AMBULATORY_CARE_PROVIDER_SITE_OTHER): Payer: Medicare Other | Admitting: Internal Medicine

## 2020-05-03 ENCOUNTER — Ambulatory Visit (INDEPENDENT_AMBULATORY_CARE_PROVIDER_SITE_OTHER): Payer: Medicare Other | Admitting: Orthopedic Surgery

## 2020-05-03 ENCOUNTER — Telehealth (INDEPENDENT_AMBULATORY_CARE_PROVIDER_SITE_OTHER): Payer: Self-pay | Admitting: *Deleted

## 2020-05-03 ENCOUNTER — Ambulatory Visit: Payer: Medicare Other

## 2020-05-03 ENCOUNTER — Other Ambulatory Visit: Payer: Self-pay

## 2020-05-03 ENCOUNTER — Telehealth: Payer: Self-pay | Admitting: Radiology

## 2020-05-03 DIAGNOSIS — S62611D Displaced fracture of proximal phalanx of left index finger, subsequent encounter for fracture with routine healing: Secondary | ICD-10-CM | POA: Diagnosis not present

## 2020-05-03 NOTE — Telephone Encounter (Signed)
Patient states she was told to stop her pepcid, thinks was you that told her to stop, I do not see where she was told to d/c, is it okay for her to use pepcid?

## 2020-05-03 NOTE — Patient Instructions (Addendum)
Work on bending the finger.  Keep the Band-Aid on until the wound stopped bleeding continue the antibiotics you are on (sulfamethoxazole trimethoprim)

## 2020-05-03 NOTE — Telephone Encounter (Signed)
She has an appointment Monday with NUR.  She has some labs recently from her PCP and wanted to know if it was the same or does she still need to get more.

## 2020-05-03 NOTE — Progress Notes (Signed)
Chief Complaint  Patient presents with  . Follow-up    Recheck on left index finger, DOS 04-10-20.   Postop day #23  Norma [name] is doing good except for stiffness of the PIP joint her x-ray looks good I took her pins out she can advance her flexion exercises as tolerated  Her wound looks reasonably good continue antibiotics  Follow-up in 3 weeks

## 2020-05-03 NOTE — Telephone Encounter (Signed)
Yes

## 2020-05-03 NOTE — Telephone Encounter (Signed)
I do not see any recent blood work in [facility]. She needs CBC serum iron TIBC and ferritin. Order is already in place. Please let her know

## 2020-05-04 NOTE — Telephone Encounter (Signed)
Called to advise. Mail box full can not leave message

## 2020-05-05 ENCOUNTER — Telehealth: Payer: Self-pay | Admitting: Neurology

## 2020-05-05 MED ORDER — ROPINIROLE HCL 3 MG PO TABS: ORAL_TABLET | ORAL | 1 refills | Status: DC

## 2020-05-05 NOTE — Telephone Encounter (Signed)
Dr Jannifer Franklin , your patient called the [facility] , stating she is   "out of requip" no pills left, need urgent medication refill"-  no pharmacy named, no dose and no further info.  She apparently takes 3 mg Requip generic 3 times a day.  Received usually mail order 270 tablets . I will refill tonight.  Will go through [facility] in [place], [place]

## 2020-05-08 ENCOUNTER — Encounter (INDEPENDENT_AMBULATORY_CARE_PROVIDER_SITE_OTHER): Payer: Self-pay | Admitting: Internal Medicine

## 2020-05-08 ENCOUNTER — Other Ambulatory Visit (INDEPENDENT_AMBULATORY_CARE_PROVIDER_SITE_OTHER): Payer: Self-pay | Admitting: *Deleted

## 2020-05-08 ENCOUNTER — Telehealth (INDEPENDENT_AMBULATORY_CARE_PROVIDER_SITE_OTHER): Payer: Medicare Other | Admitting: Internal Medicine

## 2020-05-08 ENCOUNTER — Other Ambulatory Visit: Payer: Self-pay

## 2020-05-08 DIAGNOSIS — K219 Gastro-esophageal reflux disease without esophagitis: Secondary | ICD-10-CM

## 2020-05-08 DIAGNOSIS — D649 Anemia, unspecified: Secondary | ICD-10-CM

## 2020-05-08 DIAGNOSIS — Z862 Personal history of diseases of the blood and blood-forming organs and certain disorders involving the immune mechanism: Secondary | ICD-10-CM | POA: Diagnosis not present

## 2020-05-08 MED ORDER — PANTOPRAZOLE SODIUM 40 MG PO TBEC: 40.0000 mg | DELAYED_RELEASE_TABLET | Freq: Every evening | ORAL | 3 refills | Status: DC

## 2020-05-08 NOTE — Progress Notes (Signed)
Virtual Visit via Telephone Note  I connected with Norma [name] on 05/08/20 at 9:20 AM EST by telephone and verified that I am speaking with the correct person using two identifiers.  Location: Patient: home Provider: office   I discussed the limitations, risks, security and privacy concerns of performing an evaluation and management service by telephone and the availability of in person appointments. I also discussed with the patient that there may be a patient responsible charge related to this service. The patient expressed understanding and agreed to proceed.   History of Present Illness:  Patient's last visit was on May 03, 2019 and was a virtual visit. Patient states Nexium is working very well but her insurance would not cover it anymore.  She wants to try pantoprazole.  She says she rarely has heartburn with Nexium.  She denies dysphagia hoarseness or sore throat but she has chronic dry cough.  She is not sure if it is due to allergies or postnasal drip.  She denies chest or abdominal pain.  She says her bowels move every 3 days.  She denies rectal bleeding.  Her stools have been dark since she has been on iron.  She is not having any side effects with oral iron.  She says she remains with superficial ulcer in mid abdomen.  She states ulcer does not heal completely as she keeps picking on it. She does not take diclofenac very often.  She uses alprazolam and Benadryl on as-needed basis.  She is on tramadol for chronic low back pain and neuropathy and osteoarthrosis. Patient states she had surgery on her left index finger on 04/10/2020 and had blood work at that time and she wants me to review it.    Current Outpatient Medications:  .  ALPRAZolam (XANAX) 0.25 MG tablet, Take 1 tablet (0.25 mg total) by mouth 2 (two) times daily as needed for anxiety., Disp: 60 tablet, Rfl: 2 .  aspirin EC 81 MG tablet, Take 1 tablet (81 mg total) by mouth daily., Disp: 90 tablet, Rfl: 3 .  baclofen  (LIORESAL) 10 MG tablet, TAKE ONE TABLET BY MOUTH AT BEDTIME., Disp: 30 tablet, Rfl: 6 .  buPROPion (WELLBUTRIN XL) 300 MG 24 hr tablet, TAKE ONE TABLET BY MOUTH IN THE MORNING, Disp: 28 tablet, Rfl: 2 .  calcium carbonate (TUMS - DOSED IN MG ELEMENTAL CALCIUM) 500 MG chewable tablet, Chew 2 tablets by mouth as needed for indigestion or heartburn. , Disp: , Rfl:  .  carvedilol (COREG) 6.25 MG tablet, TAKE ONE TABLET BY MOUTH TWICE DAILY (MORNING ,EVENING), Disp: 180 tablet, Rfl: 2 .  cetirizine (ZYRTEC) 10 MG tablet, Take 1 tablet tablet 1-2 times daily. (Patient taking differently: Take by mouth at bedtime.), Disp: 60 tablet, Rfl: 5 .  diclofenac Sodium (VOLTAREN) 1 % GEL, Apply topically 4 (four) times daily., Disp: , Rfl:  .  diphenhydrAMINE (BENADRYL) 25 mg capsule, Take 25 mg by mouth as needed., Disp: , Rfl:  .  EPINEPHrine 0.3 mg/0.3 mL IJ SOAJ injection, Inject 0.3 mg into the muscle once., Disp: , Rfl:  .  esomeprazole (NEXIUM) 40 MG capsule, Take 40 mg by mouth daily before supper. , Disp: , Rfl: 12 .  Evolocumab (REPATHA SURECLICK) 270 MG/ML SOAJ, Inject 1 Dose into the skin every 14 (fourteen) days., Disp: 2 pen, Rfl: 11 . Ferrous sulfate 325 mg daily with evening meal .  gabapentin (NEURONTIN) 400 MG capsule, Take 1 capsule (400 mg total) by mouth 3 (three) times daily.,  Disp: 270 capsule, Rfl: 1 .  glipiZIDE (GLUCOTROL XL) 5 MG 24 hr tablet, TAKE ONE TABLET BY MOUTH DAILY WITH BREAKFAST, Disp: 90 tablet, Rfl: 0 .  lamoTRIgine (LAMICTAL) 150 MG tablet, TAKE ONE TABLET BY MOUTH AT BEDTIME, Disp: 28 tablet, Rfl: 2 .  levothyroxine (SYNTHROID, LEVOTHROID) 100 MCG tablet, Take 100 mcg by mouth daily before breakfast., Disp: , Rfl:  .  losartan (COZAAR) 25 MG tablet, TAKE ONE TABLET BY MOUTH DAILY (MORNING), Disp: 90 tablet, Rfl: 2 .  meclizine (ANTIVERT) 25 MG tablet, Take 1 tablet (25 mg total) by mouth 3 (three) times daily as needed for dizziness. (Patient taking differently: Take 25 mg  by mouth daily as needed for dizziness.), Disp: 30 tablet, Rfl: 0 .  metFORMIN (GLUCOPHAGE) 500 MG tablet, TAKE ONE TABLET BY MOUTH TWICE DAILY WITH A MEAL (MORNING ,EVENING), Disp: 180 tablet, Rfl: 0 .  montelukast (SINGULAIR) 10 MG tablet, Take 10 mg by mouth daily as needed. , Disp: , Rfl:  .  rOPINIRole (REQUIP) 3 MG tablet, TAKE ONE TABLET BY MOUTH EVERY MORNING, ,NOON AND AT BEDTIME. (PRT PT MORNING,EVENING,BEDTIME), Disp: 270 tablet, Rfl: 1 .  traMADol (ULTRAM) 50 MG tablet, Take 100 mg by mouth 4 (four) times daily as needed. , Disp: , Rfl:  .  venlafaxine XR (EFFEXOR-XR) 150 MG 24 hr capsule, Take 2 capsules (300 mg total) by mouth at bedtime., Disp: 60 capsule, Rfl: 2 .  cefdinir (OMNICEF) 300 MG capsule, Take 1 capsule (300 mg total) by mouth 2 (two) times daily. (Patient not taking: Reported on 05/08/2020), Disp: 14 capsule, Rfl: 2 .  clindamycin (CLEOCIN) 150 MG capsule, Take 1 capsule (150 mg total) by mouth 4 (four) times daily., Disp: 40 capsule, Rfl: 0 .  ondansetron (ZOFRAN) 4 MG tablet, Take 1 tablet (4 mg total) by mouth 2 (two) times daily as needed for nausea or vomiting. (Patient not taking: Reported on 05/08/2020), Disp: 30 tablet, Rfl: 1 .  sulfamethoxazole-trimethoprim (BACTRIM DS) 800-160 MG tablet, Take 1 tablet by mouth 2 (two) times daily. (Patient not taking: Reported on 05/08/2020), Disp: 20 tablet, Rfl: 2 .  tizanidine (ZANAFLEX) 2 MG capsule, Take 1 capsule (2 mg total) by mouth 3 (three) times daily. (Patient not taking: Reported on 05/08/2020), Disp: 30 capsule, Rfl: 0  Observations/Objective:  Patient weighed 232 pounds on 05/03/2019. No recent weight available.  CBC from 04/11/2020 WBC 11.9, H&H 10.1 and 33.1, MCV 94.3 and platelet count 352K.  Assessment and Plan:  #1.  Chronic GERD.  She is doing very well with Nexium/esomeprazole but is not the preferred agent.  She would therefore would be switched to pantoprazole 40 mg p.o. daily which she can take before  breakfast or evening meal.  #2.  History of iron deficiency anemia felt to be due to impaired iron absorption.  This was initially discovered about 10 years ago.  She may also have an element of chronic disease anemia.  Follow Up Instructions:  Patient to have CBC serum iron TIBC and ferritin in 3 months. Office visit in 1 year. I discussed the assessment and treatment plan with the patient. The patient was provided an opportunity to ask questions and all were answered. The patient agreed with the plan and demonstrated an understanding of the instructions.   The patient was advised to call back or seek an in-person evaluation if the symptoms worsen or if the condition fails to improve as anticipated.  I provided 17 minutes of non-face-to-face time during this  encounter.   Hildred Laser, MD

## 2020-05-08 NOTE — Progress Notes (Signed)
Called to [place] at [facility].

## 2020-05-09 ENCOUNTER — Other Ambulatory Visit: Payer: Self-pay

## 2020-05-09 ENCOUNTER — Encounter (HOSPITAL_COMMUNITY): Payer: Self-pay | Admitting: Psychiatry

## 2020-05-09 ENCOUNTER — Telehealth (INDEPENDENT_AMBULATORY_CARE_PROVIDER_SITE_OTHER): Payer: Medicare Other | Admitting: Psychiatry

## 2020-05-09 DIAGNOSIS — F331 Major depressive disorder, recurrent, moderate: Secondary | ICD-10-CM

## 2020-05-09 MED ORDER — VENLAFAXINE HCL ER 150 MG PO CP24: 300.0000 mg | ORAL_CAPSULE | Freq: Every day | ORAL | 2 refills | Status: DC

## 2020-05-09 MED ORDER — LAMOTRIGINE 150 MG PO TABS: 150.0000 mg | ORAL_TABLET | Freq: Every day | ORAL | 2 refills | Status: DC

## 2020-05-09 MED ORDER — BUPROPION HCL ER (XL) 150 MG PO TB24: 150.0000 mg | ORAL_TABLET | ORAL | 2 refills | Status: DC

## 2020-05-09 MED ORDER — ALPRAZOLAM 0.25 MG PO TABS
0.2500 mg | ORAL_TABLET | Freq: Two times a day (BID) | ORAL | 2 refills | Status: DC | PRN
Start: 2020-05-09 — End: 2020-08-03

## 2020-05-09 NOTE — Telephone Encounter (Signed)
Patient is aware 

## 2020-05-09 NOTE — Progress Notes (Signed)
Virtual Visit via Telephone Note  I connected with Norma [name] on 05/09/20 at  1:00 PM EST by telephone and verified that I am speaking with the correct person using two identifiers.  Location: Patient: home Provider: home   I discussed the limitations, risks, security and privacy concerns of performing an evaluation and management service by telephone and the availability of in person appointments. I also discussed with the patient that there may be a patient responsible charge related to this service. The patient expressed understanding and agreed to proceed.     I discussed the assessment and treatment plan with the patient. The patient was provided an opportunity to ask questions and all were answered. The patient agreed with the plan and demonstrated an understanding of the instructions.   The patient was advised to call back or seek an in-person evaluation if the symptoms worsen or if the condition fails to improve as anticipated.  I provided 15 minutes of non-face-to-face time during this encounter.   [name], MD  [facility] MD/PA/NP OP Progress Note  05/09/2020 1:20 PM Norma [name]  MRN:  [id number]  Chief Complaint:  Chief Complaint    Depression; Anxiety; Follow-up     HPI: This patient is a 70 year old single white female who lives alone in [place].  She is a Equities trader.  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  The patient returns for follow-up after 3 months.  Unfortunately she was bitten by a pit bull in early December and it ended up breaking one of her fingers on her left hand and she had to have surgery.  She still having a lot of stiffness.  Apparently animal control took that dog along with her dog and put both of them down and this is been very difficult for her.  The patient also tells me that she is having clicking in her jaw and she is constantly readjusting it and she is also having some twitching or  jerking in her face.  She is not on any antipsychotics but it could be secondary to Wellbutrin so we will cut down the dosage.  She denies significant depression anxiety or trouble sleeping. Visit Diagnosis:    ICD-10-CM   1. Moderate episode of recurrent major depressive disorder ([place])  F33.1     Past Psychiatric History: She was hospitalized in 2001 and went through a course of ECT.  She has been tried on [employer] Wellbutrin and Zyprexa  Past Medical History:  Past Medical History:  Diagnosis Date  . Anemia   . Anxiety   . Bipolar disorder ([place])   . Bulging lumbar disc    L3-4  . Chronic daily headache   . Chronic low back pain 09/20/2014  . COPD (chronic obstructive pulmonary disease) ([place])   . Degenerative arthritis   . Depression   . Diabetes mellitus, type II ([place])   . Diabetic neuropathy ([place]) 09/16/2018  . DM type 2 with diabetic peripheral neuropathy ([place]) 09/20/2014  . Dyslipidemia   . Dyspnea   . Gastroparesis   . GERD (gastroesophageal reflux disease)   . Heart murmur   . History of hiatal hernia   . Hypothyroidism   . IBS (irritable bowel syndrome)   . Memory difficulty 09/20/2014  . Morbid obesity ([place])   . Neuropathy   . Obstructive sleep apnea on CPAP   . Restless legs syndrome (RLS) 09/17/2012  . Stroke (cerebrum) ([place]) 05/16/2017  Left parietal  . Urticaria     Past Surgical History:  Procedure Laterality Date  . ABDOMINAL HYSTERECTOMY    . ARTHROSCOPY KNEE W/ DRILLING  06/2011   and Decemer of 2013.  [name] Carpel tunnel  1980's  . CATARACT EXTRACTION W/PHACO Right 03/21/2017   Procedure: CATARACT EXTRACTION PHACO AND INTRAOCULAR LENS PLACEMENT RIGHT EYE;  Surgeon: Baruch Goldmann, MD;  Location: [facility];  Service: Ophthalmology;  Laterality: Right;  CDE: 2.91   . CATARACT EXTRACTION W/PHACO Left 04/18/2017   Procedure: CATARACT EXTRACTION PHACO AND INTRAOCULAR LENS PLACEMENT LEFT EYE;  Surgeon: Baruch Goldmann, MD;  Location: [facility];  Service: Ophthalmology;   Laterality: Left;  left  . CHOLECYSTECTOMY    . COLONOSCOPY WITH PROPOFOL N/A 10/04/2016   Procedure: COLONOSCOPY WITH PROPOFOL;  Surgeon: Rogene Houston, MD;  [facility];  Service: Endoscopy;  Laterality: N/A;  11:10  . CORONARY ARTERY BYPASS GRAFT N/A 08/29/2017   Procedure: CORONARY ARTERY BYPASS GRAFTING (CABG) x 3 WITH ENDOSCOPIC HARVESTING OF RIGHT SAPHENOUS VEIN;  Surgeon: Ivin Poot, MD;  Location: [facility];  Service: Open Heart Surgery;  Laterality: N/A;  . ESOPHAGOGASTRODUODENOSCOPY N/A 04/25/2016   Procedure: ESOPHAGOGASTRODUODENOSCOPY (EGD);  Surgeon: Rogene Houston, MD;  [facility];  Service: Endoscopy;  Laterality: N/A;  730  . LEFT HEART CATH AND CORONARY ANGIOGRAPHY N/A 08/27/2017   Procedure: LEFT HEART CATH AND CORONARY ANGIOGRAPHY;  Surgeon: Sherren Mocha, MD;  Location: [facility];  Service: Cardiovascular::  pLAD 95% - p-mLAD 50%, ostD1 90% -pD1 80%; ostOM1 90%; rPDA 80%. EF ~50-55% - HK of dital Anterolateral & Apical wall.  - Rec CVTS c/s  . OPEN REDUCTION INTERNAL FIXATION (ORIF) HAND Left 04/10/2020   Procedure: OPEN REDUCTION INTERNAL FIXATION (ORIF) HAND, left index finger;  Surgeon: Carole Civil, MD;  Location: [facility];  Service: Orthopedics;  Laterality: Left;  0.045 k wires  . RECTAL SURGERY     fissure  . SHOULDER SURGERY Left    arthroscopy in March of this year  . TEE WITHOUT CARDIOVERSION N/A 08/29/2017   Procedure: TRANSESOPHAGEAL ECHOCARDIOGRAM (TEE);  Surgeon: Prescott Gum, Collier Salina, MD;  Location: [facility];  Service: Open Heart Surgery;  Laterality: N/A;  . TRANSTHORACIC ECHOCARDIOGRAM  06/06/2017   Mild to moderate reduced EF 40 and 45%.  Anterior septal, inferoseptal and basal to mid inferior hypokinesis.  GR 1 DD.  No significant valvular lesion    Family Psychiatric History: see below  Family History:  Family History  Problem Relation Age of Onset  . Hypertension Mother   . Lymphoma Mother   . Depression Mother    . Arthritis Father   . Alcohol abuse Father   . Hypertension Sister   . Cancer Brother        kidney and lung  . Alcohol abuse Brother   . Alcohol abuse Paternal Uncle   . Alcohol abuse Paternal Grandfather   . Alcohol abuse Paternal Grandmother   . Allergic rhinitis Neg Hx   . Angioedema Neg Hx   . Asthma Neg Hx   . Atopy Neg Hx   . Eczema Neg Hx   . Immunodeficiency Neg Hx   . Urticaria Neg Hx     Social History:  Social History   Socioeconomic History  . Marital status: Single    Spouse name: Not on file  . Number of children: 0  . Years of education: 91  . Highest education level: Not on file  Occupational History    Employer: [employer]  Tobacco Use  . Smoking status: Former Smoker    Types: Cigarettes    Quit date: 02/04/1971    Years since quitting: 49.2  . Smokeless tobacco: Never Used  . Tobacco comment: smoked 2 cigarettes a day  Vaping Use  . Vaping Use: Never used  Substance and Sexual Activity  . Alcohol use: No    Alcohol/week: 0.0 standard drinks  . Drug use: No  . Sexual activity: Never  Other Topics Concern  . Not on file  Social History Narrative   Patient lives at home alone.    Patient has no children.    Patient has her masters in nursing.    Patient is single.    Patient drinks about 2 glasses of tea daily.   Patient is right handed.   Social Determinants of Health   Financial Resource Strain: Not on file  Food Insecurity: Not on file  Transportation Needs: Not on file  Physical Activity: Not on file  Stress: Not on file  Social Connections: Not on file    Allergies:  Allergies  Allergen Reactions  . Amoxicillin Anaphylaxis and Other (See Comments)    Has patient had a PCN reaction causing immediate rash, facial/tongue/throat swelling, SOB or lightheadedness with hypotension: Yes Has patient had a PCN reaction causing severe rash involving mucus membranes or skin necrosis: Yes Has patient had a PCN reaction that  required hospitalization: Yes Has patient had a PCN reaction occurring within the last 10 years: Yes If all of the above answers are "NO", then may proceed with Cephalosporin use.   [name] Hydrocodone Anaphylaxis  . Percocet [Oxycodone-Acetaminophen] Other (See Comments)    Causes chest pain /tightness. Pressure pain around her ribs.  . Vancomycin Itching  . Depacon [Valproic Acid] Other (See Comments)    Causes falls   . Iron Nausea And Vomiting    Metabolic Disorder Labs: Lab Results  Component Value Date   HGBA1C 6.6 11/08/2019   MPG 148 03/05/2019   MPG 136.98 10/30/2018   No results found for: PROLACTIN Lab Results  Component Value Date   CHOL 129 11/08/2019   TRIG 158 11/08/2019   HDL 60 11/08/2019   CHOLHDL 3.8 03/05/2019   VLDL 31 (H) 07/10/2016   LDLCALC 43 11/08/2019   LDLCALC 152 (H) 03/05/2019   Lab Results  Component Value Date   TSH 1.49 03/05/2019   TSH 2.671 10/30/2018    Therapeutic Level Labs: No results found for: LITHIUM No results found for: VALPROATE No components found for:  CBMZ  Current Medications: Current Outpatient Medications  Medication Sig Dispense Refill  . buPROPion (WELLBUTRIN XL) 150 MG 24 hr tablet Take 1 tablet (150 mg total) by mouth every morning. 30 tablet 2  . ALPRAZolam (XANAX) 0.25 MG tablet Take 1 tablet (0.25 mg total) by mouth 2 (two) times daily as needed for anxiety. 60 tablet 2  . aspirin EC 81 MG tablet Take 1 tablet (81 mg total) by mouth daily. 90 tablet 3  . baclofen (LIORESAL) 10 MG tablet TAKE ONE TABLET BY MOUTH AT BEDTIME. 30 tablet 6  . calcium carbonate (TUMS - DOSED IN MG ELEMENTAL CALCIUM) 500 MG chewable tablet Chew 2 tablets by mouth as needed for indigestion or heartburn.     . carvedilol (COREG) 6.25 MG tablet TAKE ONE TABLET BY MOUTH TWICE DAILY (MORNING ,EVENING) 180 tablet 2  . cetirizine (ZYRTEC) 10 MG tablet Take 1 tablet tablet  1-2 times daily. (Patient taking differently: Take by mouth at bedtime.)  60 tablet 5  . clindamycin (CLEOCIN) 150 MG capsule Take 1 capsule (150 mg total) by mouth 4 (four) times daily. 40 capsule 0  . diclofenac Sodium (VOLTAREN) 1 % GEL Apply topically 4 (four) times daily.    . diphenhydrAMINE (BENADRYL) 25 mg capsule Take 25 mg by mouth as needed.    [name] EPINEPHrine 0.3 mg/0.3 mL IJ SOAJ injection Inject 0.3 mg into the muscle once.    . Evolocumab (REPATHA SURECLICK) [id number] MG/ML SOAJ Inject 1 Dose into the skin every 14 (fourteen) days. 2 pen 11  . ferrous sulfate 325 (65 FE) MG tablet Take 325 mg by mouth daily after supper.    . gabapentin (NEURONTIN) 400 MG capsule Take 1 capsule (400 mg total) by mouth 3 (three) times daily. 270 capsule 1  . glipiZIDE (GLUCOTROL XL) 5 MG 24 hr tablet TAKE ONE TABLET BY MOUTH DAILY WITH BREAKFAST 90 tablet 0  . lamoTRIgine (LAMICTAL) 150 MG tablet Take 1 tablet (150 mg total) by mouth at bedtime. 28 tablet 2  . levothyroxine (SYNTHROID, LEVOTHROID) 100 MCG tablet Take 100 mcg by mouth daily before breakfast.    . losartan (COZAAR) 25 MG tablet TAKE ONE TABLET BY MOUTH DAILY (MORNING) 90 tablet 2  . meclizine (ANTIVERT) 25 MG tablet Take 1 tablet (25 mg total) by mouth 3 (three) times daily as needed for dizziness. (Patient taking differently: Take 25 mg by mouth daily as needed for dizziness.) 30 tablet 0  . metFORMIN (GLUCOPHAGE) 500 MG tablet TAKE ONE TABLET BY MOUTH TWICE DAILY WITH A MEAL (MORNING ,EVENING) 180 tablet 0  . montelukast (SINGULAIR) 10 MG tablet Take 10 mg by mouth daily as needed.     . pantoprazole (PROTONIX) 40 MG tablet Take 1 tablet (40 mg total) by mouth every evening. 90 tablet 3  . rOPINIRole (REQUIP) 3 MG tablet TAKE ONE TABLET BY MOUTH EVERY MORNING, ,NOON AND AT BEDTIME. (PRT PT MORNING,EVENING,BEDTIME) 270 tablet 1  . tizanidine (ZANAFLEX) 2 MG capsule Take 1 capsule (2 mg total) by mouth 3 (three) times daily. (Patient not taking: Reported on 05/08/2020) 30 capsule 0  . traMADol (ULTRAM) 50 MG tablet  Take 100 mg by mouth 4 (four) times daily as needed.     . venlafaxine XR (EFFEXOR-XR) 150 MG 24 hr capsule Take 2 capsules (300 mg total) by mouth at bedtime. 60 capsule 2   No current facility-administered medications for this visit.     Musculoskeletal: Strength & Muscle Tone: within normal limits Gait & Station: normal Patient leans: N/A  Psychiatric Specialty Exam: Review of Systems  Musculoskeletal: Positive for arthralgias and joint swelling.  All other systems reviewed and are negative.   There were no vitals taken for this visit.There is no height or weight on file to calculate BMI.  General Appearance: NA  Eye Contact:  NA  Speech:  Clear and Coherent  Volume:  Normal  Mood:  Euthymic  Affect:  NA  Thought Process:  Goal Directed  Orientation:  Full (Time, Place, and Person)  Thought Content: Rumination   Suicidal Thoughts:  No  Homicidal Thoughts:  No  Memory:  Immediate;   Good Recent;   Good Remote;   Good  Judgement:  Good  Insight:  Fair  Psychomotor Activity:  Normal  Concentration:  Concentration: Good and Attention Span: Good  Recall:  Good  Fund of Knowledge: Good  Language: Good  Akathisia:  No  Handed:  Right  AIMS (if indicated): Describes twitching in her face and constantly clenching and on clenching her jaw  Assets:  Communication Skills Desire for Improvement Resilience Social Support Talents/Skills  ADL's:  Intact  Cognition: WNL  Sleep:  Good   Screenings: Mini-Mental   Flowsheet Row Office Visit from 02/18/2018 in [facility] Office Visit from 09/17/2017 in [facility]  Total Score (max 30 points ) 28 28    PHQ2-9   [facility] [place] [facility] from 05/17/2019 in [facility] Office Visit from 09/11/2017 in [facility] Office Visit from 04/22/2017 in [facility] Office Visit from 12/06/2016 in [facility] Office Visit from 07/16/2016 in [facility]  PHQ-2 Total Score 1 0 0 0 0       Assessment and Plan: This patient is a 70 year old female with a history of depression anxiety mood swings and insomnia.  She no longer needs trazodone and has stopped it.  She describes what may be tardive dyskinesia symptoms.  She is slated to see neurology in about 6 weeks.  In the interim I will cut down Wellbutrin XL from 300 mg to 150 mg daily in hopes that this will improve it.  She will continue Lamictal 150 mg daily for mood swings, Xanax 0.25 mg twice daily for anxiety and Effexor XR 300 mg every morning for depression.  She will return to see me in 3 months or call sooner as needed   [name], MD 05/09/2020, 1:20 PM

## 2020-05-17 ENCOUNTER — Encounter (INDEPENDENT_AMBULATORY_CARE_PROVIDER_SITE_OTHER): Payer: Self-pay

## 2020-05-17 DIAGNOSIS — E1142 Type 2 diabetes mellitus with diabetic polyneuropathy: Secondary | ICD-10-CM | POA: Diagnosis not present

## 2020-05-17 DIAGNOSIS — E66813 Obesity, class 3: Secondary | ICD-10-CM | POA: Insufficient documentation

## 2020-05-17 DIAGNOSIS — I10 Essential (primary) hypertension: Secondary | ICD-10-CM | POA: Diagnosis not present

## 2020-05-17 DIAGNOSIS — Z6841 Body Mass Index (BMI) 40.0 and over, adult: Secondary | ICD-10-CM | POA: Insufficient documentation

## 2020-05-19 ENCOUNTER — Other Ambulatory Visit: Payer: Self-pay | Admitting: Internal Medicine

## 2020-05-19 ENCOUNTER — Other Ambulatory Visit: Payer: Self-pay | Admitting: Cardiology

## 2020-05-19 ENCOUNTER — Other Ambulatory Visit: Payer: Self-pay | Admitting: Orthopedic Surgery

## 2020-05-19 ENCOUNTER — Telehealth: Payer: Self-pay

## 2020-05-19 MED ORDER — CLINDAMYCIN HCL 150 MG PO CAPS: 150.0000 mg | ORAL_CAPSULE | Freq: Four times a day (QID) | ORAL | 0 refills | Status: DC

## 2020-05-19 NOTE — Telephone Encounter (Signed)
Patient called and would like for the nurse to give her a call she thinks her left index finger might be infected and hurts to bend. Patient # [phone].

## 2020-05-19 NOTE — Telephone Encounter (Signed)
Called her told her to soak restart antibiotics and to come in on Monday, made appointment

## 2020-05-19 NOTE — Telephone Encounter (Signed)
I m not seeing patients today   Restart clidnamycin   Soak finger 3 x a day in warm water and salt , for 15 min   See on Monday

## 2020-05-19 NOTE — Telephone Encounter (Signed)
Gradually getting worse very red / feels "hard" has finished antibiotics

## 2020-05-22 ENCOUNTER — Encounter: Payer: Self-pay | Admitting: Orthopedic Surgery

## 2020-05-22 ENCOUNTER — Other Ambulatory Visit: Payer: Self-pay

## 2020-05-22 ENCOUNTER — Ambulatory Visit (INDEPENDENT_AMBULATORY_CARE_PROVIDER_SITE_OTHER): Payer: Medicare Other | Admitting: Orthopedic Surgery

## 2020-05-22 ENCOUNTER — Ambulatory Visit (INDEPENDENT_AMBULATORY_CARE_PROVIDER_SITE_OTHER): Payer: Medicare Other

## 2020-05-22 DIAGNOSIS — D471 Chronic myeloproliferative disease: Secondary | ICD-10-CM | POA: Insufficient documentation

## 2020-05-22 DIAGNOSIS — S62611D Displaced fracture of proximal phalanx of left index finger, subsequent encounter for fracture with routine healing: Secondary | ICD-10-CM

## 2020-05-22 DIAGNOSIS — D75839 Thrombocytosis, unspecified: Secondary | ICD-10-CM | POA: Insufficient documentation

## 2020-05-22 NOTE — Progress Notes (Signed)
Chief Complaint  Patient presents with  . Post-op Follow-up    ORIF 04/10/20 open fracture left index finger increased redness, better since back on antibiotics    70 year old female status post irrigation debridement open treatment internal fixation proximal phalanx fracture left index finger secondary to dog bite  Patient has history of intolerance to amoxicillin due to amoxicillin anaphylactic reaction  She was treated with Omnicef and doxycycline however on January 12 her finger still had quite a bit of erythema she was switched over to Bactrim and doxycycline  On January 19 her x-ray showed healing of the fracture with excellent alignment though she had PIP joint stiffness.  The pins were removed on that day  She called on Friday, February 4 complaining of increased pain and swelling after being off antibiotics  She started 3 times a day soaks and restarted clindamycin  She presents now with deformity of the finger PIP joint stiffness  Imaging shows loss of reduction with angulation of the proximal phalanx  Images were placed in the chart she does have some mild erythema in the finger.  She is going to continue the antibiotics.  We did discuss the possibility of further surgery and got regarding irrigation debridement and repinning  I will see her in a week to see if some of the infection has calmed down and discussed possible pinning.

## 2020-05-22 NOTE — Patient Instructions (Signed)
Continue medications

## 2020-05-25 ENCOUNTER — Ambulatory Visit: Payer: Medicare Other | Admitting: Orthopedic Surgery

## 2020-05-25 DIAGNOSIS — Z9889 Other specified postprocedural states: Secondary | ICD-10-CM

## 2020-05-25 DIAGNOSIS — S62611D Displaced fracture of proximal phalanx of left index finger, subsequent encounter for fracture with routine healing: Secondary | ICD-10-CM

## 2020-05-29 ENCOUNTER — Telehealth: Payer: Self-pay | Admitting: Cardiology

## 2020-05-29 ENCOUNTER — Ambulatory Visit: Payer: Medicare Other | Admitting: Orthopedic Surgery

## 2020-05-29 MED ORDER — TORSEMIDE 20 MG PO TABS: 20.0000 mg | ORAL_TABLET | ORAL | 0 refills | Status: DC | PRN

## 2020-05-29 NOTE — Telephone Encounter (Signed)
New message      *STAT* If patient is at the pharmacy, call can be transferred to refill team.   1. Which medications need to be refilled? (please list name of each medication and dose if known) torsemide   2. Which pharmacy/location (including street and city if local pharmacy) is medication to be sent to? [facility] - ask for [place]   3. Do they need a 30 day or 90 day supply? Norma [name]

## 2020-05-29 NOTE — Telephone Encounter (Signed)
Done

## 2020-05-30 ENCOUNTER — Other Ambulatory Visit: Payer: Self-pay | Admitting: Neurology

## 2020-05-31 ENCOUNTER — Ambulatory Visit: Payer: Medicare Other | Admitting: Orthopedic Surgery

## 2020-05-31 ENCOUNTER — Encounter: Payer: Self-pay | Admitting: Orthopedic Surgery

## 2020-05-31 ENCOUNTER — Other Ambulatory Visit: Payer: Self-pay

## 2020-05-31 ENCOUNTER — Ambulatory Visit (INDEPENDENT_AMBULATORY_CARE_PROVIDER_SITE_OTHER): Payer: Medicare Other | Admitting: Orthopedic Surgery

## 2020-05-31 VITALS — BP 128/68 | HR 77 | Ht 60.0 in

## 2020-05-31 DIAGNOSIS — Z9889 Other specified postprocedural states: Secondary | ICD-10-CM

## 2020-05-31 DIAGNOSIS — Z8781 Personal history of (healed) traumatic fracture: Secondary | ICD-10-CM

## 2020-05-31 DIAGNOSIS — S62611K Displaced fracture of proximal phalanx of left index finger, subsequent encounter for fracture with nonunion: Secondary | ICD-10-CM

## 2020-05-31 NOTE — Progress Notes (Signed)
Chief Complaint  Patient presents with  . Hand Pain    Left index finger, Patient reports doing some better.    69 status post dog bite left index finger had surgery for open reduction internal fixation irrigation debridement on December 27 pins were taken out 3-1/2 weeks later patient developed infected nonunion  Restarted antibiotics on 7 February.  See the pictures in the chart redness is down swelling is now motion has improved  Recommend continue clindamycin for 2 more weeks  X-ray in 2 weeks  At that time determine what to do with the nonunion  Encounter Diagnoses  Name Primary?  . S/P ORIF (open reduction internal fixation) fracture left index finger 04/10/20 Yes  . Open displaced fracture of proximal phalanx of left index finger with nonunion, subsequent encounter

## 2020-06-01 ENCOUNTER — Other Ambulatory Visit (INDEPENDENT_AMBULATORY_CARE_PROVIDER_SITE_OTHER): Payer: Self-pay | Admitting: *Deleted

## 2020-06-01 DIAGNOSIS — D649 Anemia, unspecified: Secondary | ICD-10-CM

## 2020-06-02 ENCOUNTER — Other Ambulatory Visit: Payer: Self-pay | Admitting: Orthopedic Surgery

## 2020-06-02 MED ORDER — CLINDAMYCIN HCL 150 MG PO CAPS: 150.0000 mg | ORAL_CAPSULE | Freq: Four times a day (QID) | ORAL | 0 refills | Status: DC

## 2020-06-02 NOTE — Telephone Encounter (Signed)
She missed her follow up appointment I left message for her to let us know how she is doing and to RS her missed appointment  She is asking for renewal of the ABX /pended

## 2020-06-02 NOTE — Telephone Encounter (Signed)
Patient states that she has taken the last of the Clindamycin 150 mgs.  Qty 40   Sig: Take 1 capsule (150 mg total) by mouth 4 (four) times daily.   Patient uses [facility] in [place]

## 2020-06-14 ENCOUNTER — Ambulatory Visit: Payer: Medicare Other | Admitting: Orthopedic Surgery

## 2020-06-14 DIAGNOSIS — S62611K Displaced fracture of proximal phalanx of left index finger, subsequent encounter for fracture with nonunion: Secondary | ICD-10-CM

## 2020-06-14 DIAGNOSIS — Z8781 Personal history of (healed) traumatic fracture: Secondary | ICD-10-CM

## 2020-06-19 ENCOUNTER — Encounter: Payer: Self-pay | Admitting: Orthopedic Surgery

## 2020-06-19 ENCOUNTER — Ambulatory Visit (INDEPENDENT_AMBULATORY_CARE_PROVIDER_SITE_OTHER): Payer: Medicare Other

## 2020-06-19 ENCOUNTER — Other Ambulatory Visit: Payer: Self-pay

## 2020-06-19 ENCOUNTER — Ambulatory Visit (INDEPENDENT_AMBULATORY_CARE_PROVIDER_SITE_OTHER): Payer: Medicare Other | Admitting: Orthopedic Surgery

## 2020-06-19 VITALS — BP 137/114 | HR 74 | Ht 60.0 in

## 2020-06-19 DIAGNOSIS — S62611K Displaced fracture of proximal phalanx of left index finger, subsequent encounter for fracture with nonunion: Secondary | ICD-10-CM | POA: Diagnosis not present

## 2020-06-19 NOTE — Progress Notes (Signed)
Chief Complaint  Patient presents with  . Hand Pain    Left index finger, patient reports some better,    Status post dog bite status post open reduction internal fixation left index finger April 10, 2020 developed nonunion after pins were removed  She also has decreased flexion of the PIP and DIP joints  X-ray today confirms nonunion  Clinical exam shows that her infection is cleared  After discussion we decided to let her work on her finger flexion on her own versus go to occupational therapy  I will see her in 3 weeks we will rediscuss

## 2020-06-20 ENCOUNTER — Emergency Department (HOSPITAL_COMMUNITY): Payer: Medicare Other

## 2020-06-20 ENCOUNTER — Other Ambulatory Visit: Payer: Self-pay

## 2020-06-20 ENCOUNTER — Emergency Department (HOSPITAL_COMMUNITY)
Admission: EM | Admit: 2020-06-20 | Discharge: 2020-06-20 | Disposition: A | Discharge status: Home / Self Care | Payer: Medicare Other | Attending: Emergency Medicine | Admitting: Emergency Medicine

## 2020-06-20 DIAGNOSIS — M1711 Unilateral primary osteoarthritis, right knee: Secondary | ICD-10-CM | POA: Diagnosis not present

## 2020-06-20 DIAGNOSIS — E1140 Type 2 diabetes mellitus with diabetic neuropathy, unspecified: Secondary | ICD-10-CM | POA: Diagnosis not present

## 2020-06-20 DIAGNOSIS — Z7984 Long term (current) use of oral hypoglycemic drugs: Secondary | ICD-10-CM | POA: Diagnosis not present

## 2020-06-20 DIAGNOSIS — N189 Chronic kidney disease, unspecified: Secondary | ICD-10-CM | POA: Diagnosis not present

## 2020-06-20 DIAGNOSIS — I25810 Atherosclerosis of coronary artery bypass graft(s) without angina pectoris: Secondary | ICD-10-CM | POA: Diagnosis not present

## 2020-06-20 DIAGNOSIS — E039 Hypothyroidism, unspecified: Secondary | ICD-10-CM | POA: Diagnosis not present

## 2020-06-20 DIAGNOSIS — I2510 Atherosclerotic heart disease of native coronary artery without angina pectoris: Secondary | ICD-10-CM | POA: Insufficient documentation

## 2020-06-20 DIAGNOSIS — J449 Chronic obstructive pulmonary disease, unspecified: Secondary | ICD-10-CM | POA: Diagnosis not present

## 2020-06-20 DIAGNOSIS — Z87891 Personal history of nicotine dependence: Secondary | ICD-10-CM | POA: Diagnosis not present

## 2020-06-20 DIAGNOSIS — M25561 Pain in right knee: Secondary | ICD-10-CM | POA: Diagnosis not present

## 2020-06-20 DIAGNOSIS — I1310 Hypertensive heart and chronic kidney disease without heart failure, with stage 1 through stage 4 chronic kidney disease, or unspecified chronic kidney disease: Secondary | ICD-10-CM | POA: Insufficient documentation

## 2020-06-20 DIAGNOSIS — E1122 Type 2 diabetes mellitus with diabetic chronic kidney disease: Secondary | ICD-10-CM | POA: Insufficient documentation

## 2020-06-20 DIAGNOSIS — Z79899 Other long term (current) drug therapy: Secondary | ICD-10-CM | POA: Diagnosis not present

## 2020-06-20 DIAGNOSIS — Z7982 Long term (current) use of aspirin: Secondary | ICD-10-CM | POA: Diagnosis not present

## 2020-06-20 MED ORDER — KETOROLAC TROMETHAMINE 30 MG/ML IJ SOLN
15.0000 mg | Freq: Once | INTRAMUSCULAR | Status: AC
  Administered 2020-06-20: 15 mg via INTRAMUSCULAR
  Filled 2020-06-20: qty 1

## 2020-06-20 MED ORDER — ACETAMINOPHEN 325 MG PO TABS
650.0000 mg | ORAL_TABLET | Freq: Once | ORAL | Status: AC
  Administered 2020-06-20: 650 mg via ORAL
  Filled 2020-06-20: qty 2

## 2020-06-20 NOTE — Discharge Instructions (Addendum)
You were evaluated in the ER for your knee pain. Your xray was negative for fractures or dislocations of your bones. This is very reassuring. Your pain is secondary to your severe osteoarthritis.  You have been placed in a knee immobilizer to provide support and take some pressure off of your knee.  Please wear this while walking and use your walker to get around your home.  Please call Dr. Ruthe Mannan office to schedule follow up for definitive management of your knee pain.   Continue to use tramadol and tylenol at home as needed; you may also use your voltaren gel and the salve you have at home for symptom management.   Return to the ED if you develop any numbness, tingling, weakness in your legs or you develop any other new and severe symptoms.

## 2020-06-20 NOTE — ED Triage Notes (Signed)
[facility] for cc of knee pain x 2 days. Went to Dr Aline Brochure yesterday for the same. Was ambulatory on scene. A&O x 4.

## 2020-06-20 NOTE — ED Provider Notes (Signed)
[facility] EMERGENCY DEPARTMENT Provider Note   CSN: [id number] Arrival date & time: 06/20/20  2021     History Chief Complaint  Patient presents with  . Knee Pain    Right    Norma [name] is a 70 y.o. female with history of severe osteoarthritis follows with orthopedics who presents with concern for worsening right knee pain that has inhibited her ability to walk and stand on her own today.  She states she saw her orthopedic surgeon yesterday for a separate issue and did not discuss her knee pain.  She has discussed knee pain in the past with him with anticipation that total knee replacement will be required.  Patient unfortunately has history of anaphylaxis to hydrocodone/oxycodone/morphine.  She takes Tylenol and tramadol at home for her pain without relief.  She states that the most helpful treatment is combination of Voltaren and CBD salve.  She states that today she was unable to ambulate around her home due to the pain and therefore cannot get to the restroom on her own or get to the kitchen to prepare her meals.  She denies any recent trauma to the knee or any falls.  I personally reviewed this patient's medical records.  She has history of hypertension, type 2 diabetes, CKD, hyperlipidemia, OSA, left bundle branch block, GERD, coronary artery disease, CVA, and severe osteoarthritis.  Additionally patient has diabetic neuropathy in her feet for which he is on gabapentin.  HPI     Past Medical History:  Diagnosis Date  . Anemia   . Anxiety   . Bipolar disorder ([place])   . Bulging lumbar disc    L3-4  . Chronic daily headache   . Chronic low back pain 09/20/2014  . COPD (chronic obstructive pulmonary disease) ([place])   . Degenerative arthritis   . Depression   . Diabetes mellitus, type II ([place])   . Diabetic neuropathy ([place]) 09/16/2018  . DM type 2 with diabetic peripheral neuropathy ([place]) 09/20/2014  . Dyslipidemia   . Dyspnea   . Gastroparesis   . GERD (gastroesophageal  reflux disease)   . Heart murmur   . History of hiatal hernia   . Hypothyroidism   . IBS (irritable bowel syndrome)   . Memory difficulty 09/20/2014  . Morbid obesity ([place])   . Neuropathy   . Obstructive sleep apnea on CPAP   . Restless legs syndrome (RLS) 09/17/2012  . Stroke (cerebrum) ([place]) 05/16/2017   Left parietal  . Urticaria     Patient Active Problem List   Diagnosis Date Noted  . Myeloproliferative disorder ([place]) 05/22/2020  . Thrombocytosis 05/22/2020  . S/P ORIF (open reduction internal fixation) fracture left index finger 04/10/20 04/26/2020  . Open displaced fracture of proximal phalanx of left index finger   . Cellulitis of left hand   . Dog bite, hand, left, initial encounter 04/09/2020  . Dog bite 04/09/2020  . Low back pain 03/01/2020  . Hypoxemia 09/23/2019  . Sleep related hypoxia 09/23/2019  . Obesity with alveolar hypoventilation and body mass index (BMI) of 40 or greater ([place]) 09/23/2019  . Hypersomnia with long sleep time, idiopathic 08/16/2019  . Insomnia due to other mental disorder 08/16/2019  . Dry mouth not due to sicca syndrome 08/16/2019  . Chronic coughing 08/16/2019  . Unilateral primary osteoarthritis, right knee 02/18/2019  . Bilateral primary osteoarthritis of knee 01/20/2019  . Diabetic neuropathy ([place]) 09/16/2018  . Unilateral primary osteoarthritis, left knee 09/16/2018  . Class 3 severe obesity due  to excess calories with serious comorbidity and body mass index (BMI) of 45.0 to 49.9 in adult [facility]) 02/20/2018  . Mixed hyperlipidemia 09/11/2017  . Pressure injury of skin 09/02/2017  . Coronary artery disease involving native heart without angina pectoris   . S/P CABG x 3   . Leukocytosis   . Acute blood loss anemia   . Bipolar affective disorder ([place])   . Diabetes mellitus type 2 in obese ([place])   . History of CVA (cerebrovascular accident)   . Hypokalemia   . Hx of CABG 08/29/2017  . Coronary artery disease involving native coronary  artery of native heart with unstable angina pectoris ([place]) 08/28/2017  . Unstable angina ([place]) 08/27/2017  . Cardiomyopathy ([place]) 08/22/2017  . Stroke (cerebrum) ([place]) 05/16/2017  . Anemia 09/19/2016  . Dizziness 06/21/2016  . Chest pain with moderate risk for cardiac etiology 04/23/2016  . Bipolar I disorder, most recent episode depressed ([place]) 06/05/2015  . DM type 2 causing vascular disease ([place]) 09/20/2014  . Chronic bilateral low back pain with bilateral sciatica 09/20/2014  . Morbid obesity, unspecified obesity type ([place]) 09/20/2014  . Memory difficulty 09/20/2014  . Lumbosacral spondylosis without myelopathy 12/25/2012  . Gastroparesis 11/04/2012  . Restless legs syndrome (RLS) 09/17/2012  . Headache(784.0) 03/19/2012  . GERD (gastroesophageal reflux disease) 02/16/2012  . Hypothyroidism 02/16/2012  . Tremor 02/16/2012  . Hyperlipidemia associated with type 2 diabetes mellitus ([place]) 04/01/2008  . OSA (obstructive sleep apnea) 04/01/2008  . Essential hypertension, benign 04/01/2008  . LBBB (left bundle branch block) 04/01/2008    Past Surgical History:  Procedure Laterality Date  . ABDOMINAL HYSTERECTOMY    . ARTHROSCOPY KNEE W/ DRILLING  06/2011   and Dece[place] of 2013.  [name] Carpel tunnel  1980's  . CATARACT EXTRACTION W/PHACO Right 03/21/2017   Procedure: CATARACT EXTRACTION PHACO AND INTRAOCULAR LENS PLACEMENT RIGHT EYE;  Surgeon: Baruch Goldmann, MD;  Location: [facility];  Service: Ophthalmology;  Laterality: Right;  CDE: 2.91   . CATARACT EXTRACTION W/PHACO Left 04/18/2017   Procedure: CATARACT EXTRACTION PHACO AND INTRAOCULAR LENS PLACEMENT LEFT EYE;  Surgeon: Baruch Goldmann, MD;  Location: [facility];  Service: Ophthalmology;  Laterality: Left;  left  . CHOLECYSTECTOMY    . COLONOSCOPY WITH PROPOFOL N/A 10/04/2016   Procedure: COLONOSCOPY WITH PROPOFOL;  Surgeon: Rogene Houston, MD;  Location: [facility];  Service: Endoscopy;  Laterality: N/A;  11:10  . CORONARY ARTERY BYPASS  GRAFT N/A 08/29/2017   Procedure: CORONARY ARTERY BYPASS GRAFTING (CABG) x 3 WITH ENDOSCOPIC HARVESTING OF RIGHT SAPHENOUS VEIN;  Surgeon: Ivin Poot, MD;  Location: [place];  Service: Open Heart Surgery;  Laterality: N/A;  . ESOPHAGOGASTRODUODENOSCOPY N/A 04/25/2016   Procedure: ESOPHAGOGASTRODUODENOSCOPY (EGD);  Surgeon: Rogene Houston, MD;  [facility];  Service: Endoscopy;  Laterality: N/A;  730  . LEFT HEART CATH AND CORONARY ANGIOGRAPHY N/A 08/27/2017   Procedure: LEFT HEART CATH AND CORONARY ANGIOGRAPHY;  Surgeon: Sherren Mocha, MD;  Location: [place] [facility];  Service: Cardiovascular::  pLAD 95% - p-mLAD 50%, ostD1 90% -pD1 80%; ostOM1 90%; rPDA 80%. EF ~50-55% - HK of dital Anterolateral & Apical wall.  - Rec CVTS c/s  . OPEN REDUCTION INTERNAL FIXATION (ORIF) HAND Left 04/10/2020   Procedure: OPEN REDUCTION INTERNAL FIXATION (ORIF) HAND, left index finger;  Surgeon: Carole Civil, MD;  Location: [facility];  Service: Orthopedics;  Laterality: Left;  0.045 k wires  . RECTAL SURGERY     fissure  .  SHOULDER SURGERY Left    arthroscopy in March of this year  . TEE WITHOUT CARDIOVERSION N/A 08/29/2017   Procedure: TRANSESOPHAGEAL ECHOCARDIOGRAM (TEE);  Surgeon: Prescott Gum, Collier Salina, MD;  Location: [place];  Service: Open Heart Surgery;  Laterality: N/A;  . TRANSTHORACIC ECHOCARDIOGRAM  06/06/2017   Mild to moderate reduced EF 40 and 45%.  Anterior septal, inferoseptal and basal to mid inferior hypokinesis.  GR 1 DD.  No significant valvular lesion     OB History   No obstetric history on file.     Family History  Problem Relation Age of Onset  . Hypertension Mother   . Lymphoma Mother   . Depression Mother   . Arthritis Father   . Alcohol abuse Father   . Hypertension Sister   . Cancer Brother        kidney and lung  . Alcohol abuse Brother   . Alcohol abuse Paternal Uncle   . Alcohol abuse Paternal Grandfather   . Alcohol abuse Paternal Grandmother   .  Allergic rhinitis Neg Hx   . Angioedema Neg Hx   . Asthma Neg Hx   . Atopy Neg Hx   . Eczema Neg Hx   . Immunodeficiency Neg Hx   . Urticaria Neg Hx     Social History   Tobacco Use  . Smoking status: Former Smoker    Types: Cigarettes    Quit date: 02/04/1971    Years since quitting: 49.4  . Smokeless tobacco: Never Used  . Tobacco comment: smoked 2 cigarettes a day  Vaping Use  . Vaping Use: Never used  Substance Use Topics  . Alcohol use: No    Alcohol/week: 0.0 standard drinks  . Drug use: No    Home Medications Prior to Admission medications   Medication Sig Start Date End Date Taking? Authorizing Provider  ALPRAZolam (XANAX) 0.25 MG tablet Take 1 tablet (0.25 mg total) by mouth 2 (two) times daily as needed for anxiety. 05/09/20   Cloria Spring, MD  aspirin EC 81 MG tablet Take 1 tablet (81 mg total) by mouth daily. 01/20/18   Arnoldo Lenis, MD  baclofen (LIORESAL) 10 MG tablet TAKE ONE TABLET BY MOUTH AT BEDTIME. 01/04/20   Kathrynn Ducking, MD  buPROPion (WELLBUTRIN XL) 150 MG 24 hr tablet Take 1 tablet (150 mg total) by mouth every morning. 05/09/20 05/09/21  Cloria Spring, MD  calcium carbonate (TUMS - DOSED IN MG ELEMENTAL CALCIUM) 500 MG chewable tablet Chew 2 tablets by mouth as needed for indigestion or heartburn.     [provider]  carvedilol (COREG) 6.25 MG tablet TAKE ONE TABLET BY MOUTH TWICE DAILY (MORNING ,EVENING) 10/06/19   Branch, Alphonse Guild, MD  cetirizine (ZYRTEC) 10 MG tablet Take 1 tablet tablet 1-2 times daily. Patient taking differently: Take by mouth at bedtime. 04/25/19   Valentina Shaggy, MD  clindamycin (CLEOCIN) 150 MG capsule Take 1 capsule (150 mg total) by mouth 4 (four) times daily. 06/02/20   Carole Civil, MD  diclofenac Sodium (VOLTAREN) 1 % GEL Apply topically 4 (four) times daily.    [provider]  diphenhydrAMINE (BENADRYL) 25 mg capsule Take 25 mg by mouth as needed.    [provider]   EPINEPHrine 0.3 mg/0.3 mL IJ SOAJ injection Inject 0.3 mg into the muscle once.    [provider]  ferrous sulfate 325 (65 FE) MG tablet Take 325 mg by mouth daily after supper.  [provider]  gabapentin (NEURONTIN) 400 MG capsule TAKE ONE CAPSULE BY MOUTH THREE TIMES DAILY (1AM,1NOON,1PM) 05/31/20   Kathrynn Ducking, MD  glipiZIDE (GLUCOTROL XL) 5 MG 24 hr tablet TAKE ONE TABLET BY MOUTH DAILY WITH BREAKFAST 10/14/19   Cassandria Anger, MD  lamoTRIgine (LAMICTAL) 150 MG tablet Take 1 tablet (150 mg total) by mouth at bedtime. 05/09/20   Cloria Spring, MD  levothyroxine (SYNTHROID, LEVOTHROID) 100 MCG tablet Take 100 mcg by mouth daily before breakfast.    [provider]  losartan (COZAAR) 25 MG tablet TAKE ONE TABLET BY MOUTH DAILY (MORNING) 02/29/20   Branch, Alphonse Guild, MD  meclizine (ANTIVERT) 25 MG tablet Take 1 tablet (25 mg total) by mouth 3 (three) times daily as needed for dizziness. Patient taking differently: Take 25 mg by mouth daily as needed for dizziness. 03/29/14   Virgel Manifold, MD  metFORMIN (GLUCOPHAGE) 500 MG tablet TAKE ONE TABLET BY MOUTH TWICE DAILY WITH A MEAL (MORNING ,EVENING) 10/14/19   Nida, Marella Chimes, MD  montelukast (SINGULAIR) 10 MG tablet Take 10 mg by mouth daily as needed.  08/02/19   [provider]  pantoprazole (PROTONIX) 40 MG tablet Take 1 tablet (40 mg total) by mouth every evening. 05/08/20   Rogene Houston, MD  REPATHA SURECLICK 967 MG/ML SOAJ INJECT ONE DOSE INTO SKIN EVERY 14 DAYS. 05/19/20   Hilty, Nadean Corwin, MD  rOPINIRole (REQUIP) 3 MG tablet TAKE ONE TABLET BY MOUTH EVERY MORNING, ,NOON AND AT BEDTIME. (PRT PT MORNING,EVENING,BEDTIME) 05/05/20   Dohmeier, Asencion Partridge, MD  tizanidine (ZANAFLEX) 2 MG capsule Take 1 capsule (2 mg total) by mouth 3 (three) times daily. 03/15/20   Wurst, [place], PA-C  torsemide (DEMADEX) 20 MG tablet Take 1 tablet (20 mg total) by mouth as needed. 05/29/20   Arnoldo Lenis, MD   traMADol (ULTRAM) 50 MG tablet Take 100 mg by mouth 4 (four) times daily as needed.     [provider]  venlafaxine XR (EFFEXOR-XR) 150 MG 24 hr capsule Take 2 capsules (300 mg total) by mouth at bedtime. 05/09/20   Cloria Spring, MD    Allergies    Amoxicillin, Hydrocodone, Percocet [oxycodone-acetaminophen], Vancomycin, and Depacon [valproic acid]  Review of Systems   Review of Systems  Constitutional: Negative.   HENT: Negative.   Respiratory: Negative.   Cardiovascular: Negative.   Gastrointestinal: Negative.   Musculoskeletal: Positive for arthralgias.  Skin: Negative.     Physical Exam Updated Vital Signs BP (!) 146/60   Pulse 99   Temp 97.7 F (36.5 C) (Oral)   Resp 18   Ht 5' (1.524 m)   Wt 105.2 kg   SpO2 98%   BMI 45.31 kg/m   Physical Exam Vitals and nursing note reviewed.  Constitutional:      Appearance: She is morbidly obese.  HENT:     Head: Normocephalic and atraumatic.  Eyes:     General: No scleral icterus.       Right eye: No discharge.        Left eye: No discharge.     Extraocular Movements: Extraocular movements intact.     Conjunctiva/sclera: Conjunctivae normal.     Pupils: Pupils are equal, round, and reactive to light.  Cardiovascular:     Rate and Rhythm: Normal rate.     Pulses:          Dorsalis pedis pulses are 2+ on the right side and 2+ on the left side.  Pulmonary:     Effort: Pulmonary effort is normal.  Musculoskeletal:     Right hip: Normal.     Left hip: Normal.     Right upper leg: Normal.     Left upper leg: Normal.     Right knee: Effusion and bony tenderness present. No crepitus. Tenderness present. Normal alignment.     Left knee: Normal.     Right lower leg: 1+ Edema present.     Left lower leg: 1+ Edema present.     Right ankle: Normal.     Right Achilles Tendon: Normal.     Left ankle: Normal.     Left Achilles Tendon: Normal.     Right foot: Normal. Normal capillary refill. Normal pulse.      Left foot: Normal. Normal capillary refill. Normal pulse.     Comments: No skin changes, erythema, ecchymosis, or crepitus to the knee.  There is tenderness palpation over the distal right femur and over the patella, with mild joint effusion of the joint.  No skin changes to suggest trauma or infection.  No warmth to the touch. 5/5 plantar dorsiflexion bilaterally. Full passive range of motion of the knees bilaterally though there was pain experienced with this movement.  Skin:    General: Skin is warm and dry.     Capillary Refill: Capillary refill takes less than 2 seconds.     Findings: No ecchymosis, rash or wound.  Neurological:     Mental Status: She is alert and oriented to person, place, and time. Mental status is at baseline.     Sensory: Sensation is intact.     Motor: Motor function is intact.  Psychiatric:        Mood and Affect: Mood normal.     ED Results / Procedures / Treatments   Labs (all labs ordered are listed, but only abnormal results are displayed) Labs Reviewed - No data to display  EKG None  Radiology DG Knee Complete 4 Views Right  Result Date: 06/20/2020 CLINICAL DATA:  Right knee pain for 2 days, worse with standing EXAM: RIGHT KNEE - COMPLETE 4+ VIEW COMPARISON:  None. FINDINGS: Frontal, bilateral oblique, and lateral views of the right knee are obtained. There is severe 3 compartmental osteoarthritis with joint space narrowing and osteophyte formation greatest in the lateral and patellofemoral compartments. No fracture, subluxation, or dislocation. Trace joint effusion is likely reactive. IMPRESSION: 1. Severe 3 compartmental osteoarthritis.  No acute fracture. 2. Trace joint effusion, likely reactive. Electronically Signed   By: Randa Ngo M.D.   On: 06/20/2020 21:05    Procedures Procedures   Medications Ordered in ED Medications  ketorolac (TORADOL) 30 MG/ML injection 15 mg (15 mg Intramuscular Given 06/20/20 2138)  acetaminophen (TYLENOL) tablet  650 mg (650 mg Oral Given 06/20/20 2139)    ED Course  I have reviewed the triage vital signs and the nursing notes.  Pertinent labs & imaging results that were available during my care of the patient were reviewed by me and considered in my medical decision making (see chart for details).    MDM Rules/Calculators/A&P                         70 year old female with history of morbid obesity and severe osteoarthritis in the bilateral knees presents with worsening right knee pain that is inhibiting her ability to ambulate independently.  Differential diagnosis for this patient symptoms includes but is not limited to osteoarthritis,  gout/pseudogout, septic bursitis, septic arthritis, DVT, fracture or dislocation.  Hypertensive on intake.  Vital signs otherwise normal.  Physical exam revealed bilateral knee tenderness palpation, worse on the right than the left with mild joint effusion and tenderness palpation over the distal femur and patella.  Full passive range of motion the pain was elicited with ranging of the knee.  No signs of infection to suggest septic arthritis.  Normal pulses and cap refill in the feet bilaterally. Plain film obtained in triage with severe tricompartmental osteoarthritis without acute fracture or dislocation.  There is a trace joint effusion.  Unfortunately patient is severely allergic to opiates.  Will proceed with low-dose Toradol IM, Tylenol, and knee immobilizer and will attempt to get patient to stand and ambulate.  Patient does have a walker at home.  Patient ambulated to and from the restroom in knee immobilizer; she states her pain is relieved with the knee immobilizer.  Given reassuring physical exam, vital signs, and ability to ambulate with knee immobilizer, no further work-up is warranted in the ED at this time.  Feel this patient's symptoms are secondary to her osteoarthritis, and she is safe to be discharged home this evening.  Her nephew will be staying with  her overnight to help her ambulate to and from the restroom this evening should she need assistance.  Recommend she follow-up closely with her orthopedic surgeon for definitive management of her knee pain.  Cecylia voiced understanding of her medical evaluation and treatment plan.  Each of her questions was answered to his expressed satisfaction.  Return precautions given.  Patient is stable and appropriate for discharge at this time.  This chart was dictated using voice recognition software, Dragon. Despite the best efforts of this provider to proofread and correct errors, errors may still occur which can change documentation meaning.  Final Clinical Impression(s) / ED Diagnoses Final diagnoses:  Primary osteoarthritis of right knee    Rx / DC Orders ED Discharge Orders    None       Aura Dials 06/20/20 2237    Daleen Bo, MD 06/26/20 [phone]

## 2020-06-28 ENCOUNTER — Ambulatory Visit: Payer: Medicare Other | Admitting: Orthopedic Surgery

## 2020-06-29 ENCOUNTER — Ambulatory Visit: Payer: Medicare Other | Admitting: Orthopedic Surgery

## 2020-07-05 ENCOUNTER — Telehealth: Payer: Self-pay

## 2020-07-05 NOTE — Progress Notes (Signed)
PATIENT: Norma [name] DOB: 08-29-1950  REASON FOR VISIT: follow up HISTORY FROM: patient  HISTORY OF PRESENT ILLNESS: Today 07/06/20 Norma [name] is a 70 year old female with history of multiple medical issues, has significant obesity is followed for RLS.  Has mild memory disturbance, gait disorder, prior right parietal stroke. EMG/NCV in March 2021 showed evidence of diabetic peripheral neuropathy, no evidence of an overlying lumbosacral radiculopathy.  On Requip and gabapentin.  Saw Dr. Brett Fairy for sleep consult in May 2021, long history of OSA on CPAP.  Had sleep study, recommended use of auto titration CPAP 6-16 cmH2O, with 2 L oxygen.  Overnight pulse oximetry report suggested need for higher oxygen.  On Requip 3 mg 3 times daily, gabapentin 400 mg 3 times daily. Starts in upper thighs, groin, then goes into legs. Has most trouble during the days, nighttime under control, baclofen 10 mg at bedtime.   Using walker, no recent falls. Claims with prolonged standing her legs go numb, has chronic low back pain, radiates down both legs more on the right. Her main issue is her pain in her legs, feels urge to move around, gets in the shower, pulsating shower head helps.  Lives alone, prefers not to drive. Had a dog bite to left 2nd digit. Here today unaccompanied.  HISTORY  06/02/2019 Dr. Jannifer Franklin: Norma [name] is a 70 year old right-handed white female with a history of multiple medical issues.  The patient has significant obesity, she has been followed for restless leg syndrome.  She is on Requip taking 2 mg 3 times daily.  In the past this has been effective but it is tending to wear off with its beneficial effects.  The patient indicates that she sometimes has to take a hot bath or shower to get rid of the discomfort, walking does not improve the symptoms.  The patient has a mild memory disturbance, this has remained stable according to the patient.  She has numbness in the feet, she is having  trouble driving because she cannot feel where her feet are, she only drives short distances.  She has had 1 fall since Christmas of 2020, she is not using a cane or a walker.  She has undergone physical therapy in the past for gait training.  She has developed over the last year some back pain and pain down the right leg.  She was seen by Dr. Durward Fortes and underwent MRI of the lumbar spine that did not show evidence of nerve root impingement.  The patient also has a mild tremor involving the arms.  She reports some occasional dizziness and difficulty with clumsiness with the hands and difficulty typing.  She has a history of a prior right parietal stroke.  She returns to the office today for an evaluation.  REVIEW OF SYSTEMS: Out of a complete 14 system review of symptoms, the patient complains only of the following symptoms, and all other reviewed systems are negative.  See HPI  ALLERGIES: Allergies  Allergen Reactions  . Amoxicillin Anaphylaxis and Other (See Comments)    Has patient had a PCN reaction causing immediate rash, facial/tongue/throat swelling, SOB or lightheadedness with hypotension: Yes Has patient had a PCN reaction causing severe rash involving mucus membranes or skin necrosis: Yes Has patient had a PCN reaction that required hospitalization: Yes Has patient had a PCN reaction occurring within the last 10 years: Yes If all of the above answers are "NO", then may proceed with Cephalosporin use.   [name] Hydrocodone Anaphylaxis  .  Percocet [Oxycodone-Acetaminophen] Other (See Comments)    Causes chest pain /tightness. Pressure pain around her ribs.  . Vancomycin Itching  . Depacon [Valproic Acid] Other (See Comments)    Causes falls     HOME MEDICATIONS: Outpatient Medications Prior to Visit  Medication Sig Dispense Refill  . ALPRAZolam (XANAX) 0.25 MG tablet Take 1 tablet (0.25 mg total) by mouth 2 (two) times daily as needed for anxiety. 60 tablet 2  . aspirin EC 81 MG tablet  Take 1 tablet (81 mg total) by mouth daily. 90 tablet 3  . buPROPion (WELLBUTRIN XL) 150 MG 24 hr tablet Take 1 tablet (150 mg total) by mouth every morning. 30 tablet 2  . calcium carbonate (TUMS - DOSED IN MG ELEMENTAL CALCIUM) 500 MG chewable tablet Chew 2 tablets by mouth as needed for indigestion or heartburn.     . carvedilol (COREG) 6.25 MG tablet TAKE ONE TABLET BY MOUTH TWICE DAILY (MORNING ,EVENING) 180 tablet 2  . cetirizine (ZYRTEC) 10 MG tablet Take 1 tablet tablet 1-2 times daily. (Patient taking differently: Take by mouth at bedtime.) 60 tablet 5  . diclofenac Sodium (VOLTAREN) 1 % GEL Apply topically 4 (four) times daily.    . diphenhydrAMINE (BENADRYL) 25 mg capsule Take 25 mg by mouth as needed.    [name] EPINEPHrine 0.3 mg/0.3 mL IJ SOAJ injection Inject 0.3 mg into the muscle once.    . ferrous sulfate 325 (65 FE) MG tablet Take 325 mg by mouth daily after supper.    [name] glipiZIDE (GLUCOTROL XL) 5 MG 24 hr tablet TAKE ONE TABLET BY MOUTH DAILY WITH BREAKFAST 90 tablet 0  . lamoTRIgine (LAMICTAL) 150 MG tablet Take 1 tablet (150 mg total) by mouth at bedtime. 28 tablet 2  . levothyroxine (SYNTHROID, LEVOTHROID) 100 MCG tablet Take 100 mcg by mouth daily before breakfast.    . losartan (COZAAR) 25 MG tablet TAKE ONE TABLET BY MOUTH DAILY (MORNING) 90 tablet 2  . meclizine (ANTIVERT) 25 MG tablet Take 1 tablet (25 mg total) by mouth 3 (three) times daily as needed for dizziness. (Patient taking differently: Take 25 mg by mouth daily as needed for dizziness.) 30 tablet 0  . metFORMIN (GLUCOPHAGE) 500 MG tablet TAKE ONE TABLET BY MOUTH TWICE DAILY WITH A MEAL (MORNING ,EVENING) 180 tablet 0  . pantoprazole (PROTONIX) 40 MG tablet Take 1 tablet (40 mg total) by mouth every evening. 90 tablet 3  . REPATHA SURECLICK 315 MG/ML SOAJ INJECT ONE DOSE INTO SKIN EVERY 14 DAYS. 2 mL 11  . torsemide (DEMADEX) 20 MG tablet Take 1 tablet (20 mg total) by mouth as needed. 90 tablet 0  . traMADol (ULTRAM)  50 MG tablet Take 100 mg by mouth 4 (four) times daily as needed.     . venlafaxine XR (EFFEXOR-XR) 150 MG 24 hr capsule Take 2 capsules (300 mg total) by mouth at bedtime. 60 capsule 2  . baclofen (LIORESAL) 10 MG tablet TAKE ONE TABLET BY MOUTH AT BEDTIME. 30 tablet 6  . gabapentin (NEURONTIN) 400 MG capsule TAKE ONE CAPSULE BY MOUTH THREE TIMES DAILY (1AM,1NOON,1PM) 270 capsule 1  . rOPINIRole (REQUIP) 3 MG tablet TAKE ONE TABLET BY MOUTH EVERY MORNING, ,NOON AND AT BEDTIME. (PRT PT MORNING,EVENING,BEDTIME) 270 tablet 1  . tizanidine (ZANAFLEX) 2 MG capsule Take 1 capsule (2 mg total) by mouth 3 (three) times daily. 30 capsule 0  . clindamycin (CLEOCIN) 150 MG capsule Take 1 capsule (150 mg total) by mouth 4 (four)  times daily. 40 capsule 0  . montelukast (SINGULAIR) 10 MG tablet Take 10 mg by mouth daily as needed.      No facility-administered medications prior to visit.    PAST MEDICAL HISTORY: Past Medical History:  Diagnosis Date  . Anemia   . Anxiety   . Bipolar disorder ([place])   . Bulging lumbar disc    L3-4  . Chronic daily headache   . Chronic low back pain 09/20/2014  . COPD (chronic obstructive pulmonary disease) ([place])   . Degenerative arthritis   . Depression   . Diabetes mellitus, type II ([place])   . Diabetic neuropathy ([place]) 09/16/2018  . DM type 2 with diabetic peripheral neuropathy ([place]) 09/20/2014  . Dyslipidemia   . Dyspnea   . Gastroparesis   . GERD (gastroesophageal reflux disease)   . Heart murmur   . History of hiatal hernia   . Hypothyroidism   . IBS (irritable bowel syndrome)   . Memory difficulty 09/20/2014  . Morbid obesity ([place])   . Neuropathy   . Obstructive sleep apnea on CPAP   . Restless legs syndrome (RLS) 09/17/2012  . Stroke (cerebrum) ([place]) 05/16/2017   Left parietal  . Urticaria     PAST SURGICAL HISTORY: Past Surgical History:  Procedure Laterality Date  . ABDOMINAL HYSTERECTOMY    . ARTHROSCOPY KNEE W/ DRILLING  06/2011   and Dece[place] of  2013.  [name] Carpel tunnel  1980's  . CATARACT EXTRACTION W/PHACO Right 03/21/2017   Procedure: CATARACT EXTRACTION PHACO AND INTRAOCULAR LENS PLACEMENT RIGHT EYE;  Surgeon: Baruch Goldmann, MD;  Location: [facility];  Service: Ophthalmology;  Laterality: Right;  CDE: 2.91   . CATARACT EXTRACTION W/PHACO Left 04/18/2017   Procedure: CATARACT EXTRACTION PHACO AND INTRAOCULAR LENS PLACEMENT LEFT EYE;  Surgeon: Baruch Goldmann, MD;  Location: [facility];  Service: Ophthalmology;  Laterality: Left;  left  . CHOLECYSTECTOMY    . COLONOSCOPY WITH PROPOFOL N/A 10/04/2016   Procedure: COLONOSCOPY WITH PROPOFOL;  Surgeon: Rogene Houston, MD;  [facility];  Service: Endoscopy;  Laterality: N/A;  11:10  . CORONARY ARTERY BYPASS GRAFT N/A 08/29/2017   Procedure: CORONARY ARTERY BYPASS GRAFTING (CABG) x 3 WITH ENDOSCOPIC HARVESTING OF RIGHT SAPHENOUS VEIN;  Surgeon: Ivin Poot, MD;  Location: [facility];  Service: Open Heart Surgery;  Laterality: N/A;  . ESOPHAGOGASTRODUODENOSCOPY N/A 04/25/2016   Procedure: ESOPHAGOGASTRODUODENOSCOPY (EGD);  Surgeon: Rogene Houston, MD;  [facility];  Service: Endoscopy;  Laterality: N/A;  730  . LEFT HEART CATH AND CORONARY ANGIOGRAPHY N/A 08/27/2017   Procedure: LEFT HEART CATH AND CORONARY ANGIOGRAPHY;  Surgeon: Sherren Mocha, MD;  Location: [facility];  Service: Cardiovascular::  pLAD 95% - p-mLAD 50%, ostD1 90% -pD1 80%; ostOM1 90%; rPDA 80%. EF ~50-55% - HK of dital Anterolateral & Apical wall.  - Rec CVTS c/s  . OPEN REDUCTION INTERNAL FIXATION (ORIF) HAND Left 04/10/2020   Procedure: OPEN REDUCTION INTERNAL FIXATION (ORIF) HAND, left index finger;  Surgeon: Carole Civil, MD;  Location: [facility];  Service: Orthopedics;  Laterality: Left;  0.045 k wires  . RECTAL SURGERY     fissure  . SHOULDER SURGERY Left    arthroscopy in March of this year  . TEE WITHOUT CARDIOVERSION N/A 08/29/2017   Procedure: TRANSESOPHAGEAL ECHOCARDIOGRAM (TEE);   Surgeon: Prescott Gum, Collier Salina, MD;  Location: [facility];  Service: Open Heart Surgery;  Laterality: N/A;  . TRANSTHORACIC ECHOCARDIOGRAM  06/06/2017   Mild to  moderate reduced EF 40 and 45%.  Anterior septal, inferoseptal and basal to mid inferior hypokinesis.  GR 1 DD.  No significant valvular lesion    FAMILY HISTORY: Family History  Problem Relation Age of Onset  . Hypertension Mother   . Lymphoma Mother   . Depression Mother   . Arthritis Father   . Alcohol abuse Father   . Hypertension Sister   . Cancer Brother        kidney and lung  . Alcohol abuse Brother   . Alcohol abuse Paternal Uncle   . Alcohol abuse Paternal Grandfather   . Alcohol abuse Paternal Grandmother   . Allergic rhinitis Neg Hx   . Angioedema Neg Hx   . Asthma Neg Hx   . Atopy Neg Hx   . Eczema Neg Hx   . Immunodeficiency Neg Hx   . Urticaria Neg Hx     SOCIAL HISTORY: Social History   Socioeconomic History  . Marital status: Single    Spouse name: Not on file  . Number of children: 0  . Years of education: 25  . Highest education level: Not on file  Occupational History    Employer: [employer]  Tobacco Use  . Smoking status: Former Smoker    Types: Cigarettes    Quit date: 02/04/1971    Years since quitting: 49.4  . Smokeless tobacco: Never Used  . Tobacco comment: smoked 2 cigarettes a day  Vaping Use  . Vaping Use: Never used  Substance and Sexual Activity  . Alcohol use: No    Alcohol/week: 0.0 standard drinks  . Drug use: No  . Sexual activity: Never  Other Topics Concern  . Not on file  Social History Narrative   Patient lives at home alone.    Patient has no children.    Patient has her masters in nursing.    Patient is single.    Patient drinks about 2 glasses of tea daily.   Patient is right handed.   Social Determinants of Health   Financial Resource Strain: Not on file  Food Insecurity: Not on file  Transportation Needs: Not on file  Physical Activity: Not on  file  Stress: Not on file  Social Connections: Not on file  Intimate Partner Violence: Not on file   PHYSICAL EXAM  Vitals:   07/06/20 1328  BP: 140/82  Pulse: 72  Weight: 235 lb (106.6 kg)  Height: 5' (1.524 m)   Body mass index is 45.9 kg/m.  Generalized: Well developed, in no acute distress  Neurological examination  Mentation: Alert oriented to time, place, history taking. Follows all commands speech and language fluent Cranial nerve II-XII: Pupils were equal round reactive to light. Extraocular movements were full, visual field were full on confrontational test. Facial sensation and strength were normal. Head turning and shoulder shrug  were normal and symmetric. Motor: Good strength of all extremities Sensory: Sensory testing is intact to soft touch on all 4 extremities. No evidence of extinction is noted.  Coordination: Cerebellar testing reveals good finger-nose-finger and heel-to-shin bilaterally (heel to shin is slow) Gait and station: Has to push off from seated position to stand, gait is slightly wide-based, limping on the right, uses walker Reflexes: Deep tendon reflexes are symmetric but depressed throughout  DIAGNOSTIC DATA (LABS, IMAGING, TESTING) - I reviewed patient records, labs, notes, testing and imaging myself where available.  Lab Results  Component Value Date   WBC 11.9 (H) 04/11/2020   HGB 10.1 (  L) 04/11/2020   HCT 33.1 (L) 04/11/2020   MCV 94.3 04/11/2020   PLT 352 04/11/2020      Component Value Date/Time   NA 138 04/11/2020 0528   NA 140 04/23/2019 1511   K 4.5 04/11/2020 0528   CL 104 04/11/2020 0528   CO2 24 04/11/2020 0528   GLUCOSE 130 (H) 04/11/2020 0528   BUN 19 04/11/2020 0528   BUN 21 11/08/2019 0000   CREATININE 1.11 (H) 04/11/2020 0528   CREATININE 1.10 (H) 03/05/2019 1221   CALCIUM 8.7 (L) 04/11/2020 0528   PROT 7.6 04/09/2020 0654   PROT 6.7 04/23/2019 1511   ALBUMIN 3.7 04/09/2020 0654   ALBUMIN 3.7 (L) 04/23/2019 1511    AST 19 04/09/2020 0654   ALT 14 04/09/2020 0654   ALKPHOS 58 04/09/2020 0654   BILITOT 0.3 04/09/2020 0654   BILITOT <0.2 04/23/2019 1511   GFRNONAA 54 (L) 04/11/2020 0528   GFRNONAA 53 (L) 03/05/2019 1221   GFRAA 56 (L) 04/23/2019 1511   GFRAA 61 03/05/2019 1221   Lab Results  Component Value Date   CHOL 129 11/08/2019   HDL 60 11/08/2019   LDLCALC 43 11/08/2019   TRIG 158 11/08/2019   CHOLHDL 3.8 03/05/2019   Lab Results  Component Value Date   HGBA1C 6.6 11/08/2019   Lab Results  Component Value Date   VITAMINB12 240 09/20/2014   Lab Results  Component Value Date   TSH 1.49 03/05/2019   ASSESSMENT AND PLAN 70 y.o. year old female  has a past medical history of Anemia, Anxiety, Bipolar disorder ([place]), Bulging lumbar disc, Chronic daily headache, Chronic low back pain (09/20/2014), COPD (chronic obstructive pulmonary disease) ([place]), Degenerative arthritis, Depression, Diabetes mellitus, type II ([place]), Diabetic neuropathy ([place]) (09/16/2018), DM type 2 with diabetic peripheral neuropathy ([place]) (09/20/2014), Dyslipidemia, Dyspnea, Gastroparesis, GERD (gastroesophageal reflux disease), Heart murmur, History of hiatal hernia, Hypothyroidism, IBS (irritable bowel syndrome), Memory difficulty (09/20/2014), Morbid obesity ([place]), Neuropathy, Obstructive sleep apnea on CPAP, Restless legs syndrome (RLS) (09/17/2012), Stroke (cerebrum) ([place]) (05/16/2017), and Urticaria. here with:  1.  Diabetic peripheral neuropathy 2.  Right-sided sciatica 3.  Gait disorder 4.  Restless leg syndrome -NCV/EMG in march 2021 showed diabetic peripheral neuropathy,EMG on the left showed no evidence of an overlying lumbosacral radiculopathy -Increase gabapentin 400 mg AM/800 mg midday/400 mg PM (most symptoms are afternoon), to help with restless leg symptoms, back pain, bilateral leg pain, creatinine Dec 2021 1.11, creatinine clearance 83 -Continue Requip 3 mg 3 times daily -Continue baclofen 10 mg at bedtime -Review  of chart indicates she saw Dr. Davy Pique at pain management for consult (she doesn't mention this), but I cannot review the full note Feb 2022, self-referred for spinal cord stimulation therapy  5.  OSA with CPAP -Indicates, currently poor compliance, did not bring her CPAP card, suggested she schedule a follow-up for CPAP compliance in 1 to 2 months to show improvement  I spent 30 minutes of face-to-face and non-face-to-face time with patient.  This included previsit chart review, lab review, study review, order entry, electronic health record documentation, patient education.  Butler Denmark, AGNP-C, DNP 07/06/2020, 2:11 PM [facility] [address], [place], [place] [address] [phone]

## 2020-07-05 NOTE — Telephone Encounter (Signed)
LVM, asked pt to bring cpap to appt

## 2020-07-06 ENCOUNTER — Ambulatory Visit (INDEPENDENT_AMBULATORY_CARE_PROVIDER_SITE_OTHER): Payer: Medicare Other | Admitting: Neurology

## 2020-07-06 ENCOUNTER — Encounter: Payer: Self-pay | Admitting: Neurology

## 2020-07-06 VITALS — BP 140/82 | HR 72 | Ht 60.0 in | Wt 235.0 lb

## 2020-07-06 DIAGNOSIS — E1142 Type 2 diabetes mellitus with diabetic polyneuropathy: Secondary | ICD-10-CM

## 2020-07-06 DIAGNOSIS — G2581 Restless legs syndrome: Secondary | ICD-10-CM | POA: Diagnosis not present

## 2020-07-06 MED ORDER — BACLOFEN 10 MG PO TABS: 10.0000 mg | ORAL_TABLET | Freq: Every day | ORAL | 6 refills | Status: DC

## 2020-07-06 MED ORDER — ROPINIROLE HCL 3 MG PO TABS: ORAL_TABLET | ORAL | 1 refills | Status: DC

## 2020-07-06 MED ORDER — GABAPENTIN 400 MG PO CAPS: ORAL_CAPSULE | ORAL | 1 refills | Status: DC

## 2020-07-06 NOTE — Patient Instructions (Signed)
Increase gabapentin taking 400 mg AM, 800 mg midday, 400 mg PM  Continue other medications Need to bring in CPAP card, then make appointment See you back in 6 months

## 2020-07-07 ENCOUNTER — Other Ambulatory Visit: Payer: Self-pay

## 2020-07-07 ENCOUNTER — Ambulatory Visit (INDEPENDENT_AMBULATORY_CARE_PROVIDER_SITE_OTHER): Payer: Medicare Other | Admitting: Cardiology

## 2020-07-07 ENCOUNTER — Other Ambulatory Visit: Payer: Self-pay | Admitting: Cardiology

## 2020-07-07 ENCOUNTER — Encounter: Payer: Self-pay | Admitting: Cardiology

## 2020-07-07 VITALS — BP 117/70 | HR 88 | Ht 60.0 in | Wt 250.8 lb

## 2020-07-07 DIAGNOSIS — I5033 Acute on chronic diastolic (congestive) heart failure: Secondary | ICD-10-CM

## 2020-07-07 DIAGNOSIS — I2510 Atherosclerotic heart disease of native coronary artery without angina pectoris: Secondary | ICD-10-CM

## 2020-07-07 NOTE — Progress Notes (Signed)
Clinical Summary Ms. Dosh is a 70 y.o.female seen today for follow up of the following medical problems.  1.Chronic diastolic HF 16/6063 echo VLEF 01-60%, grade I diastolic dysfunction. - taking torsemide prn, mild uptrend in Cr from 1.2 to 1.4 by last labs - intermittent leg edema and SOB, responds well to prn torsemide.    -reports some recent increased edema. Takes torsemide prn, has not needed until recently. Hard to take consistently on days she goes out due to frequent urination.  - DOE from parking lot into office.    2. CAD -05/2017 echo LVEF 40-45% - 08/2017 cath for unstable angina: LAD 95%, D1 90%, RPDA 80%, OM 90%.  - CT surgery was consulted. 08/30/17 CABG with sequential SVG to diag and LAD, SVG-OM. - stopped lopressor due to fatigue.  - no significant chest pain.   3. Chronic LBBB   4. Hyperlipidemia -intolerant to statins - followed in lipid clinic.  - she is on repatha, LDL 152-->43 on repatha.    5. OSA - on cpap, followed by Dr Brett Fairy    Past Medical History:  Diagnosis Date  . Anemia   . Anxiety   . Bipolar disorder ([place])   . Bulging lumbar disc    L3-4  . Chronic daily headache   . Chronic low back pain 09/20/2014  . COPD (chronic obstructive pulmonary disease) ([place])   . Degenerative arthritis   . Depression   . Diabetes mellitus, type II ([place])   . Diabetic neuropathy ([place]) 09/16/2018  . DM type 2 with diabetic peripheral neuropathy ([place]) 09/20/2014  . Dyslipidemia   . Dyspnea   . Gastroparesis   . GERD (gastroesophageal reflux disease)   . Heart murmur   . History of hiatal hernia   . Hypothyroidism   . IBS (irritable bowel syndrome)   . Memory difficulty 09/20/2014  . Morbid obesity ([place])   . Neuropathy   . Obstructive sleep apnea on CPAP   . Restless legs syndrome (RLS) 09/17/2012  . Stroke (cerebrum) ([place]) 05/16/2017   Left parietal  . Urticaria      Allergies  Allergen Reactions  . Amoxicillin Anaphylaxis  and Other (See Comments)    Has patient had a PCN reaction causing immediate rash, facial/tongue/throat swelling, SOB or lightheadedness with hypotension: Yes Has patient had a PCN reaction causing severe rash involving mucus membranes or skin necrosis: Yes Has patient had a PCN reaction that required hospitalization: Yes Has patient had a PCN reaction occurring within the last 10 years: Yes If all of the above answers are "NO", then may proceed with Cephalosporin use.   [name] Hydrocodone Anaphylaxis  . Percocet [Oxycodone-Acetaminophen] Other (See Comments)    Causes chest pain /tightness. Pressure pain around her ribs.  . Vancomycin Itching  . Depacon [Valproic Acid] Other (See Comments)    Causes falls      Current Outpatient Medications  Medication Sig Dispense Refill  . ALPRAZolam (XANAX) 0.25 MG tablet Take 1 tablet (0.25 mg total) by mouth 2 (two) times daily as needed for anxiety. 60 tablet 2  . aspirin EC 81 MG tablet Take 1 tablet (81 mg total) by mouth daily. 90 tablet 3  . baclofen (LIORESAL) 10 MG tablet Take 1 tablet (10 mg total) by mouth at bedtime. 30 tablet 6  . buPROPion (WELLBUTRIN XL) 150 MG 24 hr tablet Take 1 tablet (150 mg total) by mouth every morning. 30 tablet 2  . calcium carbonate (TUMS - DOSED IN  MG ELEMENTAL CALCIUM) 500 MG chewable tablet Chew 2 tablets by mouth as needed for indigestion or heartburn.     . carvedilol (COREG) 6.25 MG tablet TAKE ONE TABLET BY MOUTH TWICE DAILY (MORNING ,EVENING) 180 tablet 2  . cetirizine (ZYRTEC) 10 MG tablet Take 1 tablet tablet 1-2 times daily. (Patient taking differently: Take by mouth at bedtime.) 60 tablet 5  . diclofenac Sodium (VOLTAREN) 1 % GEL Apply topically 4 (four) times daily.    . diphenhydrAMINE (BENADRYL) 25 mg capsule Take 25 mg by mouth as needed.    [name] EPINEPHrine 0.3 mg/0.3 mL IJ SOAJ injection Inject 0.3 mg into the muscle once.    . ferrous sulfate 325 (65 FE) MG tablet Take 325 mg by mouth daily after  supper.    . gabapentin (NEURONTIN) 400 MG capsule Take 1 capsule in the morning, take 2 midday, take 1 at night 360 capsule 1  . glipiZIDE (GLUCOTROL XL) 5 MG 24 hr tablet TAKE ONE TABLET BY MOUTH DAILY WITH BREAKFAST 90 tablet 0  . lamoTRIgine (LAMICTAL) 150 MG tablet Take 1 tablet (150 mg total) by mouth at bedtime. 28 tablet 2  . levothyroxine (SYNTHROID, LEVOTHROID) 100 MCG tablet Take 100 mcg by mouth daily before breakfast.    . losartan (COZAAR) 25 MG tablet TAKE ONE TABLET BY MOUTH DAILY (MORNING) 90 tablet 2  . meclizine (ANTIVERT) 25 MG tablet Take 1 tablet (25 mg total) by mouth 3 (three) times daily as needed for dizziness. (Patient taking differently: Take 25 mg by mouth daily as needed for dizziness.) 30 tablet 0  . metFORMIN (GLUCOPHAGE) 500 MG tablet TAKE ONE TABLET BY MOUTH TWICE DAILY WITH A MEAL (MORNING ,EVENING) 180 tablet 0  . pantoprazole (PROTONIX) 40 MG tablet Take 1 tablet (40 mg total) by mouth every evening. 90 tablet 3  . REPATHA SURECLICK 678 MG/ML SOAJ INJECT ONE DOSE INTO SKIN EVERY 14 DAYS. 2 mL 11  . rOPINIRole (REQUIP) 3 MG tablet TAKE ONE TABLET BY MOUTH EVERY MORNING, ,NOON AND AT BEDTIME. (PRT PT MORNING,EVENING,BEDTIME) 270 tablet 1  . tizanidine (ZANAFLEX) 2 MG capsule Take 1 capsule (2 mg total) by mouth 3 (three) times daily. 30 capsule 0  . torsemide (DEMADEX) 20 MG tablet Take 1 tablet (20 mg total) by mouth as needed. 90 tablet 0  . traMADol (ULTRAM) 50 MG tablet Take 100 mg by mouth 4 (four) times daily as needed.     . venlafaxine XR (EFFEXOR-XR) 150 MG 24 hr capsule Take 2 capsules (300 mg total) by mouth at bedtime. 60 capsule 2   No current facility-administered medications for this visit.     Past Surgical History:  Procedure Laterality Date  . ABDOMINAL HYSTERECTOMY    . ARTHROSCOPY KNEE W/ DRILLING  06/2011   and Decemer of 2013.  [name] Carpel tunnel  1980's  . CATARACT EXTRACTION W/PHACO Right 03/21/2017   Procedure: CATARACT EXTRACTION  PHACO AND INTRAOCULAR LENS PLACEMENT RIGHT EYE;  Surgeon: Baruch Goldmann, MD;  Location: [facility];  Service: Ophthalmology;  Laterality: Right;  CDE: 2.91   . CATARACT EXTRACTION W/PHACO Left 04/18/2017   Procedure: CATARACT EXTRACTION PHACO AND INTRAOCULAR LENS PLACEMENT LEFT EYE;  Surgeon: Baruch Goldmann, MD;  Location: [facility];  Service: Ophthalmology;  Laterality: Left;  left  . CHOLECYSTECTOMY    . COLONOSCOPY WITH PROPOFOL N/A 10/04/2016   Procedure: COLONOSCOPY WITH PROPOFOL;  Surgeon: Rogene Houston, MD;  Location: [facility];  Service: Endoscopy;  Laterality: N/A;  11:10  . CORONARY ARTERY BYPASS GRAFT N/A 08/29/2017   Procedure: CORONARY ARTERY BYPASS GRAFTING (CABG) x 3 WITH ENDOSCOPIC HARVESTING OF RIGHT SAPHENOUS VEIN;  Surgeon: Ivin Poot, MD;  Location: [facility];  Service: Open Heart Surgery;  Laterality: N/A;  . ESOPHAGOGASTRODUODENOSCOPY N/A 04/25/2016   Procedure: ESOPHAGOGASTRODUODENOSCOPY (EGD);  Surgeon: Rogene Houston, MD;  [facility];  Service: Endoscopy;  Laterality: N/A;  730  . LEFT HEART CATH AND CORONARY ANGIOGRAPHY N/A 08/27/2017   Procedure: LEFT HEART CATH AND CORONARY ANGIOGRAPHY;  Surgeon: Sherren Mocha, MD;  Location: [facility];  Service: Cardiovascular::  pLAD 95% - p-mLAD 50%, ostD1 90% -pD1 80%; ostOM1 90%; rPDA 80%. EF ~50-55% - HK of dital Anterolateral & Apical wall.  - Rec CVTS c/s  . OPEN REDUCTION INTERNAL FIXATION (ORIF) HAND Left 04/10/2020   Procedure: OPEN REDUCTION INTERNAL FIXATION (ORIF) HAND, left index finger;  Surgeon: Carole Civil, MD;  Location: [facility];  Service: Orthopedics;  Laterality: Left;  0.045 k wires  . RECTAL SURGERY     fissure  . SHOULDER SURGERY Left    arthroscopy in March of this year  . TEE WITHOUT CARDIOVERSION N/A 08/29/2017   Procedure: TRANSESOPHAGEAL ECHOCARDIOGRAM (TEE);  Surgeon: Prescott Gum, Collier Salina, MD;  Location: [facility];  Service: Open Heart Surgery;  Laterality: N/A;  . TRANSTHORACIC  ECHOCARDIOGRAM  06/06/2017   Mild to moderate reduced EF 40 and 45%.  Anterior septal, inferoseptal and basal to mid inferior hypokinesis.  GR 1 DD.  No significant valvular lesion     Allergies  Allergen Reactions  . Amoxicillin Anaphylaxis and Other (See Comments)    Has patient had a PCN reaction causing immediate rash, facial/tongue/throat swelling, SOB or lightheadedness with hypotension: Yes Has patient had a PCN reaction causing severe rash involving mucus membranes or skin necrosis: Yes Has patient had a PCN reaction that required hospitalization: Yes Has patient had a PCN reaction occurring within the last 10 years: Yes If all of the above answers are "NO", then may proceed with Cephalosporin use.   [name] Hydrocodone Anaphylaxis  . Percocet [Oxycodone-Acetaminophen] Other (See Comments)    Causes chest pain /tightness. Pressure pain around her ribs.  . Vancomycin Itching  . Depacon [Valproic Acid] Other (See Comments)    Causes falls       Family History  Problem Relation Age of Onset  . Hypertension Mother   . Lymphoma Mother   . Depression Mother   . Arthritis Father   . Alcohol abuse Father   . Hypertension Sister   . Cancer Brother        kidney and lung  . Alcohol abuse Brother   . Alcohol abuse Paternal Uncle   . Alcohol abuse Paternal Grandfather   . Alcohol abuse Paternal Grandmother   . Allergic rhinitis Neg Hx   . Angioedema Neg Hx   . Asthma Neg Hx   . Atopy Neg Hx   . Eczema Neg Hx   . Immunodeficiency Neg Hx   . Urticaria Neg Hx      Social History Ms. Sarra reports that she quit smoking about 49 years ago. Her smoking use included cigarettes. She has never used smokeless tobacco. Ms. Chaudhary reports no history of alcohol use.   Review of Systems CONSTITUTIONAL: No weight loss, fever, chills, weakness or fatigue.  HEENT: Eyes: No visual loss, blurred vision, double vision or yellow sclerae.No hearing loss, sneezing, congestion, runny nose or sore  throat.  SKIN: No rash or itching.  CARDIOVASCULAR: per hpi RESPIRATORY: No shortness of breath, cough or sputum.  GASTROINTESTINAL: No anorexia, nausea, vomiting or diarrhea. No abdominal pain or blood.  GENITOURINARY: No burning on urination, no polyuria NEUROLOGICAL: No headache, dizziness, syncope, paralysis, ataxia, numbness or tingling in the extremities. No change in bowel or bladder control.  MUSCULOSKELETAL: No muscle, back pain, joint pain or stiffness.  LYMPHATICS: No enlarged nodes. No history of splenectomy.  PSYCHIATRIC: No history of depression or anxiety.  ENDOCRINOLOGIC: No reports of sweating, cold or heat intolerance. No polyuria or polydipsia.  [name]   Physical Examination Today's Vitals   07/07/20 1503  BP: 117/70  Pulse: 88  SpO2: 98%  Weight: 250 lb 12.8 oz (113.8 kg)  Height: 5' (1.524 m)   Body mass index is 48.98 kg/m.  Gen: resting comfortably, no acute distress HEENT: no scleral icterus, pupils equal round and reactive, no palptable cervical adenopathy,  CV: RRR, no m/r/g. Elevated JVD Resp: Clear to auscultation bilaterally GI: abdomen is soft, non-tender, non-distended, normal bowel sounds, no hepatosplenomegaly MSK: extremities are warm, 1+ bilateral LE edema  Skin: warm, no rash Neuro:  no focal deficits Psych: appropriate affect   Diagnostic Studies 03/2016 Lexiscan  Probable normal perfusion and mild soft tissue attenuation (breast) No significant ischemia or evidence for scar  The left ventricular ejection fraction is normal (55-65%).  Low risk scan.  08/2017 cath  RPDA lesion is 80% stenosed.  Ost 1st Mrg to 1st Mrg lesion is 90% stenosed.  Prox LAD lesion is 95% stenosed.  Prox LAD to Mid LAD lesion is 50% stenosed.  Ost 1st Diag lesion is 90% stenosed.  1st Diag lesion is 80% stenosed.  The left ventricular ejection fraction is 50-55% by visual estimate.  There is mild left ventricular systolic dysfunction.  1. Severe  multivessel coronary artery disease with severe stenosis of the proximal LAD/first diagonal bifurcation, first obtuse marginal branch of the circumflex, and PDA branch of the RCA. 2. Mild segmental LV systolic dysfunction with hypokinesis of the distal anterolateral and apical walls, LVEF estimated at 50 to 55%.  08/2017 TEE  Aortic valve: The valve is trileaflet. No stenosis. No regurgitation.  Mitral valve: Mild regurgitation. Central jet.  Right ventricle: Normal cavity size, wall thickness and ejection fraction.  Pericardium: Trivial pericardial effusion.  Tricuspid valve: No regurgitation  Pulmonic valve: No regurgitation by color doppler.  Left ventricle: Regional wall motion abnormalities present, dyskinetic basal and apical septal wall, hypokinesis of lateral wall and inferoseptal wall. Increased wall thickness. LVEF 35-40%.  No ASD/PFO present  Unable to rule out thrombus in LAA but velocities make thrombus less likely.  05/2017 echo Study Conclusions  - Left ventricle: The cavity size was normal. There was mild concentric hypertrophy. Systolic function was mildly to moderately reduced. The estimated ejection fraction was in the range of 40% to 45%. Hypokinesis of the anteroseptal, inferoseptal and basal to mid inferior myocardium. Doppler parameters are consistent with abnormal left ventricular relaxation (grade 1 diastolic dysfunction). Doppler parameters are consistent with indeterminate ventricular filling pressure. - Aortic valve: Transvalvular velocity was within the normal range. There was no stenosis. There was no regurgitation. - Mitral valve: Transvalvular velocity was within the normal range. There was no evidence for stenosis. There was no regurgitation. - Right ventricle: The cavity size was normal. Wall thickness was normal. Systolic function was normal. - Tricuspid valve: There was trivial regurgitation. - Global longitudinal  strain -11.9%.   01/2018 echo Study  Conclusions  - Left ventricle: The cavity size was normal. Wall thickness was increased in a pattern of mild LVH. Systolic function was normal. The estimated ejection fraction was in the range of 50% to 55%. Regional wall motion abnormalities cannot be excluded. Doppler parameters are consistent with abnormal left ventricular relaxation (grade 1 diastolic dysfunction). - Ventricular septum: Septal motion showed abnormal function and dyssynergy. These changes are consistent with a left bundle branch block. - Mitral valve: Mildly thickened leaflets .    Assessment and Plan  1.Acute on Chronic diastolic HF - will work on taking her prn torsemide more consistently over the next few days and follow volume status.   2.CAD - beta blocker stopped due to fatigue. - no symptoms, continue current meds  EKG today shows SR, chronic LBBB   Arnoldo Lenis, M.D

## 2020-07-07 NOTE — Patient Instructions (Signed)
Your physician recommends that you schedule a follow-up appointment in: [facility]  Your physician recommends that you continue on your current medications as directed. Please refer to the Current Medication list given to you today.  Thank you for choosing [facility]!!

## 2020-07-09 NOTE — Progress Notes (Signed)
I have read the note, and I agree with the clinical assessment and plan.  [place] Giammona K Devyn Griffing   

## 2020-07-10 ENCOUNTER — Ambulatory Visit: Payer: Medicare Other

## 2020-07-10 ENCOUNTER — Encounter: Payer: Self-pay | Admitting: Orthopedic Surgery

## 2020-07-10 ENCOUNTER — Ambulatory Visit: Payer: Medicare Other | Admitting: Orthopedic Surgery

## 2020-07-10 ENCOUNTER — Other Ambulatory Visit: Payer: Self-pay

## 2020-07-10 ENCOUNTER — Ambulatory Visit (INDEPENDENT_AMBULATORY_CARE_PROVIDER_SITE_OTHER): Payer: Medicare Other | Admitting: Orthopedic Surgery

## 2020-07-10 VITALS — BP 127/72 | HR 90 | Ht 60.0 in | Wt 249.0 lb

## 2020-07-10 DIAGNOSIS — M25561 Pain in right knee: Secondary | ICD-10-CM | POA: Diagnosis not present

## 2020-07-10 DIAGNOSIS — I2510 Atherosclerotic heart disease of native coronary artery without angina pectoris: Secondary | ICD-10-CM | POA: Diagnosis not present

## 2020-07-10 DIAGNOSIS — Z6841 Body Mass Index (BMI) 40.0 and over, adult: Secondary | ICD-10-CM

## 2020-07-10 DIAGNOSIS — G8929 Other chronic pain: Secondary | ICD-10-CM | POA: Diagnosis not present

## 2020-07-10 NOTE — Patient Instructions (Addendum)
Your weight has to be about 205 for the knee replacement   Let us know when you are at that weight

## 2020-07-10 NOTE — Progress Notes (Signed)
NEW PROBLEM//OFFICE VISIT  Summary assessment and plan:   Problem #1 nonunion left middle phalanx index finger.  As the patient now has a good functional range of motion can almost make a fist I told her the best thing to do is leave this alone as her function is more important  Problem #2 pain right knee severe arthritis and obesity patient needs to see nutritionist and get down to about 205 pounds for knee replacement can be done  Chief Complaint  Patient presents with  . Post-op Follow-up    Left hand finger ORIF 04/10/20 improving ROM   . Knee Pain    Right pain for years / severe in past week     Primary focus of this visit is to evaluate the patient for right knee arthritis she has been seen before and told she needed knee replacement but needed to have her weight to improve.  She her weight got better but her height decreased so her BMI stayed high and is currently 48  Complains of knee pain she is using a walker she cannot really extend the leg very well, she is diabetic   Review of Systems  Respiratory: Negative for shortness of breath.   Cardiovascular: Negative for chest pain.     Past Medical History:  Diagnosis Date  . Anemia   . Anxiety   . Bipolar disorder ([place])   . Bulging lumbar disc    L3-4  . Chronic daily headache   . Chronic low back pain 09/20/2014  . COPD (chronic obstructive pulmonary disease) ([place])   . Degenerative arthritis   . Depression   . Diabetes mellitus, type II ([place])   . Diabetic neuropathy ([place]) 09/16/2018  . DM type 2 with diabetic peripheral neuropathy ([place]) 09/20/2014  . Dyslipidemia   . Dyspnea   . Gastroparesis   . GERD (gastroesophageal reflux disease)   . Heart murmur   . History of hiatal hernia   . Hypothyroidism   . IBS (irritable bowel syndrome)   . Memory difficulty 09/20/2014  . Morbid obesity ([place])   . Neuropathy   . Obstructive sleep apnea on CPAP   . Restless legs syndrome (RLS) 09/17/2012  . Stroke (cerebrum) ([place])  05/16/2017   Left parietal  . Urticaria     Past Surgical History:  Procedure Laterality Date  . ABDOMINAL HYSTERECTOMY    . ARTHROSCOPY KNEE W/ DRILLING  06/2011   and Dece[place] of 2013.  [name] Carpel tunnel  1980's  . CATARACT EXTRACTION W/PHACO Right 03/21/2017   Procedure: CATARACT EXTRACTION PHACO AND INTRAOCULAR LENS PLACEMENT RIGHT EYE;  Surgeon: Baruch Goldmann, MD;  Location: [facility];  Service: Ophthalmology;  Laterality: Right;  CDE: 2.91   . CATARACT EXTRACTION W/PHACO Left 04/18/2017   Procedure: CATARACT EXTRACTION PHACO AND INTRAOCULAR LENS PLACEMENT LEFT EYE;  Surgeon: Baruch Goldmann, MD;  Location: [facility];  Service: Ophthalmology;  Laterality: Left;  left  . CHOLECYSTECTOMY    . COLONOSCOPY WITH PROPOFOL N/A 10/04/2016   Procedure: COLONOSCOPY WITH PROPOFOL;  Surgeon: Rogene Houston, MD;  [facility]: [facility];  Service: Endoscopy;  Laterality: N/A;  11:10  . CORONARY ARTERY BYPASS GRAFT N/A 08/29/2017   Procedure: CORONARY ARTERY BYPASS GRAFTING (CABG) x 3 WITH ENDOSCOPIC HARVESTING OF RIGHT SAPHENOUS VEIN;  Surgeon: Ivin Poot, MD;  Location: [facility];  Service: Open Heart Surgery;  Laterality: N/A;  . ESOPHAGOGASTRODUODENOSCOPY N/A 04/25/2016   Procedure: ESOPHAGOGASTRODUODENOSCOPY (EGD);  Surgeon: Rogene Houston, MD;  [facility];  Service: Endoscopy;  Laterality: N/A;  730  . LEFT HEART CATH AND CORONARY ANGIOGRAPHY N/A 08/27/2017   Procedure: LEFT HEART CATH AND CORONARY ANGIOGRAPHY;  Surgeon: Sherren Mocha, MD;  Location: [facility];  Service: Cardiovascular::  pLAD 95% - p-mLAD 50%, ostD1 90% -pD1 80%; ostOM1 90%; rPDA 80%. EF ~50-55% - HK of dital Anterolateral & Apical wall.  - Rec CVTS c/s  . OPEN REDUCTION INTERNAL FIXATION (ORIF) HAND Left 04/10/2020   Procedure: OPEN REDUCTION INTERNAL FIXATION (ORIF) HAND, left index finger;  Surgeon: Carole Civil, MD;  Location: [facility];  Service: Orthopedics;  Laterality: Left;  0.045 k wires  . RECTAL  SURGERY     fissure  . SHOULDER SURGERY Left    arthroscopy in March of this year  . TEE WITHOUT CARDIOVERSION N/A 08/29/2017   Procedure: TRANSESOPHAGEAL ECHOCARDIOGRAM (TEE);  Surgeon: Prescott Gum, Collier Salina, MD;  Location: [facility];  Service: Open Heart Surgery;  Laterality: N/A;  . TRANSTHORACIC ECHOCARDIOGRAM  06/06/2017   Mild to moderate reduced EF 40 and 45%.  Anterior septal, inferoseptal and basal to mid inferior hypokinesis.  GR 1 DD.  No significant valvular lesion    Family History  Problem Relation Age of Onset  . Hypertension Mother   . Lymphoma Mother   . Depression Mother   . Arthritis Father   . Alcohol abuse Father   . Hypertension Sister   . Cancer Brother        kidney and lung  . Alcohol abuse Brother   . Alcohol abuse Paternal Uncle   . Alcohol abuse Paternal Grandfather   . Alcohol abuse Paternal Grandmother   . Allergic rhinitis Neg Hx   . Angioedema Neg Hx   . Asthma Neg Hx   . Atopy Neg Hx   . Eczema Neg Hx   . Immunodeficiency Neg Hx   . Urticaria Neg Hx    Social History   Tobacco Use  . Smoking status: Former Smoker    Types: Cigarettes    Quit date: 02/04/1971    Years since quitting: 49.4  . Smokeless tobacco: Never Used  . Tobacco comment: smoked 2 cigarettes a day  Vaping Use  . Vaping Use: Never used  Substance Use Topics  . Alcohol use: No    Alcohol/week: 0.0 standard drinks  . Drug use: No    Allergies  Allergen Reactions  . Amoxicillin Anaphylaxis and Other (See Comments)    Has patient had a PCN reaction causing immediate rash, facial/tongue/throat swelling, SOB or lightheadedness with hypotension: Yes Has patient had a PCN reaction causing severe rash involving mucus membranes or skin necrosis: Yes Has patient had a PCN reaction that required hospitalization: Yes Has patient had a PCN reaction occurring within the last 10 years: Yes If all of the above answers are "NO", then may proceed with Cephalosporin use.   [name] Hydrocodone  Anaphylaxis  . Percocet [Oxycodone-Acetaminophen] Other (See Comments)    Causes chest pain /tightness. Pressure pain around her ribs.  . Vancomycin Itching  . Depacon [Valproic Acid] Other (See Comments)    Causes falls     Current Meds  Medication Sig  . ALPRAZolam (XANAX) 0.25 MG tablet Take 1 tablet (0.25 mg total) by mouth 2 (two) times daily as needed for anxiety.  [name] aspirin EC 81 MG tablet Take 1 tablet (81 mg total) by mouth daily.  . baclofen (LIORESAL) 10 MG tablet Take 1 tablet (10 mg total) by mouth  at bedtime.  [name] buPROPion (WELLBUTRIN XL) 150 MG 24 hr tablet Take 1 tablet (150 mg total) by mouth every morning.  . calcium carbonate (TUMS - DOSED IN MG ELEMENTAL CALCIUM) 500 MG chewable tablet Chew 2 tablets by mouth as needed for indigestion or heartburn.   . carvedilol (COREG) 6.25 MG tablet TAKE ONE TABLET BY MOUTH TWICE DAILY (MORNING ,EVENING)  . cetirizine (ZYRTEC) 10 MG tablet Take 1 tablet tablet 1-2 times daily. (Patient taking differently: Take by mouth at bedtime.)  . diclofenac Sodium (VOLTAREN) 1 % GEL Apply topically 4 (four) times daily.  . diphenhydrAMINE (BENADRYL) 25 mg capsule Take 25 mg by mouth as needed.  [name] EPINEPHrine 0.3 mg/0.3 mL IJ SOAJ injection Inject 0.3 mg into the muscle once.  . ferrous sulfate 325 (65 FE) MG tablet Take 325 mg by mouth daily after supper.  . gabapentin (NEURONTIN) 400 MG capsule Take 1 capsule in the morning, take 2 midday, take 1 at night  . glipiZIDE (GLUCOTROL XL) 5 MG 24 hr tablet TAKE ONE TABLET BY MOUTH DAILY WITH BREAKFAST  . lamoTRIgine (LAMICTAL) 150 MG tablet Take 1 tablet (150 mg total) by mouth at bedtime.  [name] levothyroxine (SYNTHROID, LEVOTHROID) 100 MCG tablet Take 100 mcg by mouth daily before breakfast.  . losartan (COZAAR) 25 MG tablet TAKE ONE TABLET BY MOUTH DAILY (MORNING)  . meclizine (ANTIVERT) 25 MG tablet Take 1 tablet (25 mg total) by mouth 3 (three) times daily as needed for dizziness. (Patient taking  differently: Take 25 mg by mouth daily as needed for dizziness.)  . metFORMIN (GLUCOPHAGE) 500 MG tablet TAKE ONE TABLET BY MOUTH TWICE DAILY WITH A MEAL (MORNING ,EVENING)  . pantoprazole (PROTONIX) 40 MG tablet Take 1 tablet (40 mg total) by mouth every evening.  [name] REPATHA SURECLICK 983 MG/ML SOAJ INJECT ONE DOSE INTO SKIN EVERY 14 DAYS.  [name] rOPINIRole (REQUIP) 3 MG tablet TAKE ONE TABLET BY MOUTH EVERY MORNING, ,NOON AND AT BEDTIME. (PRT PT MORNING,EVENING,BEDTIME)  . torsemide (DEMADEX) 20 MG tablet Take 1 tablet (20 mg total) by mouth as needed.  . traMADol (ULTRAM) 50 MG tablet Take 100 mg by mouth 4 (four) times daily as needed.   . venlafaxine XR (EFFEXOR-XR) 150 MG 24 hr capsule Take 2 capsules (300 mg total) by mouth at bedtime.    BP 127/72   Pulse 90   Ht 5' (1.524 m)   Wt 250 lb (113.4 kg)   BMI 48.82 kg/m   The patient meets the AMA guidelines for Morbid (severe) obesity with a BMI > 40.0 and I have recommended weight loss.  Physical Exam  General appearance: Well-developed well-nourished no gross deformities  Cardiovascular normal pulse and perfusion normal color without edema  Neurologically o sensation loss or deficits or pathologic reflexes  Psychological: Awake alert and oriented x3 mood and affect normal  Skin no lacerations or ulcerations no nodularity no palpable masses, no erythema or nodularity  Musculoskeletal:   Right knee flexion contractures 40 degrees she can bend the knee 120 degrees  Knee is tender in the lateral compartment patellofemoral joint and has crepitance on range of motion no laxity is noted in the sagittal or coronal plane     MEDICAL DECISION MAKING  A.  Encounter Diagnosis  Name Primary?  . Chronic pain of right knee Yes    B. DATA ANALYSED:   IMAGING: Interpretation of images: internal; this x-ray shows a 13 degree valgus knee with severe arthritis lateral compartment knee is  obliterated.  Orders: No new orders other  than to see nutrition  Outside records reviewed: none   C. MANAGEMENT   Weight loss  No orders of the defined types were placed in this encounter.     Arther Abbott, MD  07/10/2020 4:28 PM

## 2020-07-12 ENCOUNTER — Encounter: Payer: Self-pay | Admitting: *Deleted

## 2020-07-17 ENCOUNTER — Telehealth (INDEPENDENT_AMBULATORY_CARE_PROVIDER_SITE_OTHER): Payer: Self-pay | Admitting: Internal Medicine

## 2020-07-17 ENCOUNTER — Other Ambulatory Visit (INDEPENDENT_AMBULATORY_CARE_PROVIDER_SITE_OTHER): Payer: Self-pay

## 2020-07-17 DIAGNOSIS — E782 Mixed hyperlipidemia: Secondary | ICD-10-CM | POA: Diagnosis not present

## 2020-07-17 DIAGNOSIS — L501 Idiopathic urticaria: Secondary | ICD-10-CM | POA: Diagnosis not present

## 2020-07-17 DIAGNOSIS — F3181 Bipolar II disorder: Secondary | ICD-10-CM | POA: Diagnosis not present

## 2020-07-17 DIAGNOSIS — G2581 Restless legs syndrome: Secondary | ICD-10-CM | POA: Diagnosis not present

## 2020-07-17 DIAGNOSIS — N1832 Chronic kidney disease, stage 3b: Secondary | ICD-10-CM | POA: Diagnosis not present

## 2020-07-17 DIAGNOSIS — D649 Anemia, unspecified: Secondary | ICD-10-CM

## 2020-07-17 DIAGNOSIS — F5101 Primary insomnia: Secondary | ICD-10-CM | POA: Diagnosis not present

## 2020-07-17 DIAGNOSIS — E1122 Type 2 diabetes mellitus with diabetic chronic kidney disease: Secondary | ICD-10-CM | POA: Diagnosis not present

## 2020-07-17 DIAGNOSIS — Z0189 Encounter for other specified special examinations: Secondary | ICD-10-CM | POA: Diagnosis not present

## 2020-07-17 DIAGNOSIS — R296 Repeated falls: Secondary | ICD-10-CM | POA: Diagnosis not present

## 2020-07-17 DIAGNOSIS — J410 Simple chronic bronchitis: Secondary | ICD-10-CM | POA: Diagnosis not present

## 2020-07-17 DIAGNOSIS — I5022 Chronic systolic (congestive) heart failure: Secondary | ICD-10-CM | POA: Diagnosis not present

## 2020-07-17 DIAGNOSIS — E1121 Type 2 diabetes mellitus with diabetic nephropathy: Secondary | ICD-10-CM | POA: Diagnosis not present

## 2020-07-17 NOTE — Telephone Encounter (Signed)
Patient left message stating she wants her lab order sent to [facility] - ph# [phone]

## 2020-07-17 NOTE — Telephone Encounter (Signed)
Patient will get the fax number from the lab inside of Dr. Durene Cal office and call us back with it.

## 2020-07-17 NOTE — Telephone Encounter (Signed)
Fax # is [phone]

## 2020-07-17 NOTE — Telephone Encounter (Signed)
Orders faxed to the number the patient provided.

## 2020-07-27 ENCOUNTER — Encounter: Payer: Self-pay | Admitting: Orthopaedic Surgery

## 2020-07-27 ENCOUNTER — Telehealth (HOSPITAL_COMMUNITY): Payer: Medicare Other | Admitting: Psychiatry

## 2020-07-27 ENCOUNTER — Telehealth: Payer: Self-pay | Admitting: Radiology

## 2020-07-27 NOTE — Telephone Encounter (Signed)
Patient's sister Orpah Clinton called, verified per DPR we can speakw with her.  Juliann Pulse said that patient had received a call yesterday after 5pm from our office.  I do not see documentation in chart.  I do not see an appt upcoming for her either.  She cannot reach pt by phone this am, and is going over to her house to check on her.  She asks for an appt or pain medication, states patient is falling.  I advised her go check on patient and then call us to advise/update [place] and we will see what we can do to help her.

## 2020-08-01 ENCOUNTER — Telehealth (HOSPITAL_COMMUNITY): Payer: Medicare Other | Admitting: Psychiatry

## 2020-08-01 DIAGNOSIS — I10 Essential (primary) hypertension: Secondary | ICD-10-CM | POA: Diagnosis not present

## 2020-08-01 DIAGNOSIS — E119 Type 2 diabetes mellitus without complications: Secondary | ICD-10-CM | POA: Diagnosis not present

## 2020-08-02 ENCOUNTER — Ambulatory Visit: Payer: Medicare Other | Admitting: Nutrition

## 2020-08-02 ENCOUNTER — Other Ambulatory Visit (HOSPITAL_COMMUNITY): Payer: Self-pay | Admitting: Psychiatry

## 2020-08-03 ENCOUNTER — Telehealth (INDEPENDENT_AMBULATORY_CARE_PROVIDER_SITE_OTHER): Payer: Medicare Other | Admitting: Psychiatry

## 2020-08-03 ENCOUNTER — Other Ambulatory Visit: Payer: Self-pay

## 2020-08-03 ENCOUNTER — Telehealth (HOSPITAL_COMMUNITY): Payer: Medicare Other | Admitting: Psychiatry

## 2020-08-03 ENCOUNTER — Encounter (HOSPITAL_COMMUNITY): Payer: Self-pay | Admitting: Psychiatry

## 2020-08-03 ENCOUNTER — Encounter: Payer: Self-pay | Admitting: Adult Health

## 2020-08-03 DIAGNOSIS — E1140 Type 2 diabetes mellitus with diabetic neuropathy, unspecified: Secondary | ICD-10-CM | POA: Diagnosis not present

## 2020-08-03 DIAGNOSIS — I5022 Chronic systolic (congestive) heart failure: Secondary | ICD-10-CM | POA: Diagnosis not present

## 2020-08-03 DIAGNOSIS — F3181 Bipolar II disorder: Secondary | ICD-10-CM | POA: Diagnosis not present

## 2020-08-03 DIAGNOSIS — F331 Major depressive disorder, recurrent, moderate: Secondary | ICD-10-CM | POA: Diagnosis not present

## 2020-08-03 DIAGNOSIS — I2510 Atherosclerotic heart disease of native coronary artery without angina pectoris: Secondary | ICD-10-CM | POA: Diagnosis not present

## 2020-08-03 DIAGNOSIS — E782 Mixed hyperlipidemia: Secondary | ICD-10-CM | POA: Diagnosis not present

## 2020-08-03 DIAGNOSIS — L501 Idiopathic urticaria: Secondary | ICD-10-CM | POA: Diagnosis not present

## 2020-08-03 DIAGNOSIS — N1832 Chronic kidney disease, stage 3b: Secondary | ICD-10-CM | POA: Diagnosis not present

## 2020-08-03 DIAGNOSIS — Z8673 Personal history of transient ischemic attack (TIA), and cerebral infarction without residual deficits: Secondary | ICD-10-CM | POA: Diagnosis not present

## 2020-08-03 DIAGNOSIS — M545 Low back pain, unspecified: Secondary | ICD-10-CM | POA: Diagnosis not present

## 2020-08-03 DIAGNOSIS — D649 Anemia, unspecified: Secondary | ICD-10-CM | POA: Diagnosis not present

## 2020-08-03 DIAGNOSIS — M7122 Synovial cyst of popliteal space [Baker], left knee: Secondary | ICD-10-CM | POA: Diagnosis not present

## 2020-08-03 DIAGNOSIS — F5101 Primary insomnia: Secondary | ICD-10-CM | POA: Diagnosis not present

## 2020-08-03 DIAGNOSIS — G2581 Restless legs syndrome: Secondary | ICD-10-CM | POA: Diagnosis not present

## 2020-08-03 MED ORDER — ALPRAZOLAM 0.25 MG PO TABS: 0.2500 mg | ORAL_TABLET | Freq: Two times a day (BID) | ORAL | 2 refills | Status: DC | PRN

## 2020-08-03 MED ORDER — VENLAFAXINE HCL ER 150 MG PO CP24: 300.0000 mg | ORAL_CAPSULE | Freq: Every day | ORAL | 2 refills | Status: DC

## 2020-08-03 MED ORDER — LAMOTRIGINE 150 MG PO TABS: 150.0000 mg | ORAL_TABLET | Freq: Every day | ORAL | 2 refills | Status: DC

## 2020-08-03 MED ORDER — BUPROPION HCL ER (XL) 150 MG PO TB24: 150.0000 mg | ORAL_TABLET | ORAL | 2 refills | Status: DC

## 2020-08-03 NOTE — Progress Notes (Signed)
Virtual Visit via Telephone Note  I connected with Norma [name] on 08/03/20 at 11:00 AM EDT by telephone and verified that I am speaking with the correct person using two identifiers.  Location: Patient: home Provider: home office   I discussed the limitations, risks, security and privacy concerns of performing an evaluation and management service by telephone and the availability of in person appointments. I also discussed with the patient that there may be a patient responsible charge related to this service. The patient expressed understanding and agreed to proceed.    I discussed the assessment and treatment plan with the patient. The patient was provided an opportunity to ask questions and all were answered. The patient agreed with the plan and demonstrated an understanding of the instructions.   The patient was advised to call back or seek an in-person evaluation if the symptoms worsen or if the condition fails to improve as anticipated.  I provided 15 minutes of non-face-to-face time during this encounter.   [name], MD  [facility] MD/PA/NP OP Progress Note  08/03/2020 11:27 AM Norma [name]  MRN:  [id number]  Chief Complaint:  Chief Complaint    Anxiety; Depression; Follow-up     HPI: This patient is a 70 year old single white female who lives alone in [place].  She is a Equities trader.  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  Patient returns for follow-up after 3 months.  Regarding her depression and anxiety.  She states that overall she is doing okay.  She is still trying to work and doing most of it from home.  She states her mood has been generally stable.  Sometimes she has trouble sleeping and wakes up through the night back suggested that she take the Xanax and she will do so.  Since we have cut back on the Wellbutrin she is no longer having the twitching in her face of the clicking in her jaw by her report.  She is still  dealing with significant arthritis and pain in her back and legs which makes mobility difficult.  She denies significant anxiety depression or suicidal ideation Visit Diagnosis:    ICD-10-CM   1. Moderate episode of recurrent major depressive disorder ([place])  F33.1     Past Psychiatric History: She was hospitalized in 2001 and went through a course of ECT.  She has been tried on steroids on Effexor Wellbutrin and Zyprexa  Past Medical History:  Past Medical History:  Diagnosis Date  . Anemia   . Anxiety   . Bipolar disorder ([place])   . Bulging lumbar disc    L3-4  . Chronic daily headache   . Chronic low back pain 09/20/2014  . COPD (chronic obstructive pulmonary disease) ([place])   . Degenerative arthritis   . Depression   . Diabetes mellitus, type II ([place])   . Diabetic neuropathy ([place]) 09/16/2018  . DM type 2 with diabetic peripheral neuropathy ([place]) 09/20/2014  . Dyslipidemia   . Dyspnea   . Gastroparesis   . GERD (gastroesophageal reflux disease)   . Heart murmur   . History of hiatal hernia   . Hypothyroidism   . IBS (irritable bowel syndrome)   . Memory difficulty 09/20/2014  . Morbid obesity ([place])   . Neuropathy   . Obstructive sleep apnea on CPAP   . Restless legs syndrome (RLS) 09/17/2012  . Stroke (cerebrum) ([place]) 05/16/2017   Left parietal  . Urticaria  Past Surgical History:  Procedure Laterality Date  . ABDOMINAL HYSTERECTOMY    . ARTHROSCOPY KNEE W/ DRILLING  06/2011   and Decemer of 2013.  [name] Carpel tunnel  1980's  . CATARACT EXTRACTION W/PHACO Right 03/21/2017   Procedure: CATARACT EXTRACTION PHACO AND INTRAOCULAR LENS PLACEMENT RIGHT EYE;  Surgeon: Baruch Goldmann, MD;  Location: [facility];  Service: Ophthalmology;  Laterality: Right;  CDE: 2.91   . CATARACT EXTRACTION W/PHACO Left 04/18/2017   Procedure: CATARACT EXTRACTION PHACO AND INTRAOCULAR LENS PLACEMENT LEFT EYE;  Surgeon: Baruch Goldmann, MD;  Location: [facility];  Service: Ophthalmology;  Laterality: Left;  left  .  CHOLECYSTECTOMY    . COLONOSCOPY WITH PROPOFOL N/A 10/04/2016   Procedure: COLONOSCOPY WITH PROPOFOL;  Surgeon: Rogene Houston, MD;  [facility];  Service: Endoscopy;  Laterality: N/A;  11:10  . CORONARY ARTERY BYPASS GRAFT N/A 08/29/2017   Procedure: CORONARY ARTERY BYPASS GRAFTING (CABG) x 3 WITH ENDOSCOPIC HARVESTING OF RIGHT SAPHENOUS VEIN;  Surgeon: Ivin Poot, MD;  Location: [place];  Service: Open Heart Surgery;  Laterality: N/A;  . ESOPHAGOGASTRODUODENOSCOPY N/A 04/25/2016   Procedure: ESOPHAGOGASTRODUODENOSCOPY (EGD);  Surgeon: Rogene Houston, MD;  [facility];  Service: Endoscopy;  Laterality: N/A;  730  . LEFT HEART CATH AND CORONARY ANGIOGRAPHY N/A 08/27/2017   Procedure: LEFT HEART CATH AND CORONARY ANGIOGRAPHY;  Surgeon: Sherren Mocha, MD;  Location: [place] [facility];  Service: Cardiovascular::  pLAD 95% - p-mLAD 50%, ostD1 90% -pD1 80%; ostOM1 90%; rPDA 80%. EF ~50-55% - HK of dital Anterolateral & Apical wall.  - Rec CVTS c/s  . OPEN REDUCTION INTERNAL FIXATION (ORIF) HAND Left 04/10/2020   Procedure: OPEN REDUCTION INTERNAL FIXATION (ORIF) HAND, left index finger;  Surgeon: Carole Civil, MD;  Location: [facility];  Service: Orthopedics;  Laterality: Left;  0.045 k wires  . RECTAL SURGERY     fissure  . SHOULDER SURGERY Left    arthroscopy in March of this year  . TEE WITHOUT CARDIOVERSION N/A 08/29/2017   Procedure: TRANSESOPHAGEAL ECHOCARDIOGRAM (TEE);  Surgeon: Prescott Gum, Collier Salina, MD;  Location: [facility];  Service: Open Heart Surgery;  Laterality: N/A;  . TRANSTHORACIC ECHOCARDIOGRAM  06/06/2017   Mild to moderate reduced EF 40 and 45%.  Anterior septal, inferoseptal and basal to mid inferior hypokinesis.  GR 1 DD.  No significant valvular lesion    Family Psychiatric History: see below  Family History:  Family History  Problem Relation Age of Onset  . Hypertension Mother   . Lymphoma Mother   . Depression Mother   . Arthritis Father   .  Alcohol abuse Father   . Hypertension Sister   . Cancer Brother        kidney and lung  . Alcohol abuse Brother   . Alcohol abuse Paternal Uncle   . Alcohol abuse Paternal Grandfather   . Alcohol abuse Paternal Grandmother   . Allergic rhinitis Neg Hx   . Angioedema Neg Hx   . Asthma Neg Hx   . Atopy Neg Hx   . Eczema Neg Hx   . Immunodeficiency Neg Hx   . Urticaria Neg Hx     Social History:  Social History   Socioeconomic History  . Marital status: Single    Spouse name: Not on file  . Number of children: 0  . Years of education: 19  . Highest education level: Not on file  Occupational History    Employer: [employer]  Tobacco Use  . Smoking status: Former Smoker    Types: Cigarettes    Quit date: 02/04/1971    Years since quitting: 49.5  . Smokeless tobacco: Never Used  . Tobacco comment: smoked 2 cigarettes a day  Vaping Use  . Vaping Use: Never used  Substance and Sexual Activity  . Alcohol use: No    Alcohol/week: 0.0 standard drinks  . Drug use: No  . Sexual activity: Never  Other Topics Concern  . Not on file  Social History Narrative   Patient lives at home alone.    Patient has no children.    Patient has her masters in nursing.    Patient is single.    Patient drinks about 2 glasses of tea daily.   Patient is right handed.   Social Determinants of Health   Financial Resource Strain: Not on file  Food Insecurity: Not on file  Transportation Needs: Not on file  Physical Activity: Not on file  Stress: Not on file  Social Connections: Not on file    Allergies:  Allergies  Allergen Reactions  . Amoxicillin Anaphylaxis and Other (See Comments)    Has patient had a PCN reaction causing immediate rash, facial/tongue/throat swelling, SOB or lightheadedness with hypotension: Yes Has patient had a PCN reaction causing severe rash involving mucus membranes or skin necrosis: Yes Has patient had a PCN reaction that required hospitalization:  Yes Has patient had a PCN reaction occurring within the last 10 years: Yes If all of the above answers are "NO", then may proceed with Cephalosporin use.   [name] Hydrocodone Anaphylaxis  . Percocet [Oxycodone-Acetaminophen] Other (See Comments)    Causes chest pain /tightness. Pressure pain around her ribs.  . Vancomycin Itching  . Depacon [Valproic Acid] Other (See Comments)    Causes falls     Metabolic Disorder Labs: Lab Results  Component Value Date   HGBA1C 6.6 11/08/2019   MPG 148 03/05/2019   MPG 136.98 10/30/2018   No results found for: PROLACTIN Lab Results  Component Value Date   CHOL 129 11/08/2019   TRIG 158 11/08/2019   HDL 60 11/08/2019   CHOLHDL 3.8 03/05/2019   VLDL 31 (H) 07/10/2016   LDLCALC 43 11/08/2019   LDLCALC 152 (H) 03/05/2019   Lab Results  Component Value Date   TSH 1.49 03/05/2019   TSH 2.671 10/30/2018    Therapeutic Level Labs: No results found for: LITHIUM No results found for: VALPROATE No components found for:  CBMZ  Current Medications: Current Outpatient Medications  Medication Sig Dispense Refill  . ALPRAZolam (XANAX) 0.25 MG tablet Take 1 tablet (0.25 mg total) by mouth 2 (two) times daily as needed for anxiety. 60 tablet 2  . aspirin EC 81 MG tablet Take 1 tablet (81 mg total) by mouth daily. 90 tablet 3  . baclofen (LIORESAL) 10 MG tablet Take 1 tablet (10 mg total) by mouth at bedtime. 30 tablet 6  . buPROPion (WELLBUTRIN XL) 150 MG 24 hr tablet Take 1 tablet (150 mg total) by mouth every morning. 30 tablet 2  . calcium carbonate (TUMS - DOSED IN MG ELEMENTAL CALCIUM) 500 MG chewable tablet Chew 2 tablets by mouth as needed for indigestion or heartburn.     . carvedilol (COREG) 6.25 MG tablet TAKE ONE TABLET BY MOUTH TWICE DAILY (MORNING ,EVENING) 180 tablet 2  . cetirizine (ZYRTEC) 10 MG tablet Take 1 tablet tablet 1-2 times daily. (Patient taking differently: Take by mouth at bedtime.)  60 tablet 5  . diclofenac Sodium (VOLTAREN)  1 % GEL Apply topically 4 (four) times daily.    . diphenhydrAMINE (BENADRYL) 25 mg capsule Take 25 mg by mouth as needed.    [name] EPINEPHrine 0.3 mg/0.3 mL IJ SOAJ injection Inject 0.3 mg into the muscle once.    . ferrous sulfate 325 (65 FE) MG tablet Take 325 mg by mouth daily after supper.    . gabapentin (NEURONTIN) 400 MG capsule Take 1 capsule in the morning, take 2 midday, take 1 at night 360 capsule 1  . glipiZIDE (GLUCOTROL XL) 5 MG 24 hr tablet TAKE ONE TABLET BY MOUTH DAILY WITH BREAKFAST 90 tablet 0  . lamoTRIgine (LAMICTAL) 150 MG tablet Take 1 tablet (150 mg total) by mouth at bedtime. 28 tablet 2  . levothyroxine (SYNTHROID, LEVOTHROID) 100 MCG tablet Take 100 mcg by mouth daily before breakfast.    . losartan (COZAAR) 25 MG tablet TAKE ONE TABLET BY MOUTH DAILY (MORNING) 90 tablet 2  . meclizine (ANTIVERT) 25 MG tablet Take 1 tablet (25 mg total) by mouth 3 (three) times daily as needed for dizziness. (Patient taking differently: Take 25 mg by mouth daily as needed for dizziness.) 30 tablet 0  . metFORMIN (GLUCOPHAGE) 500 MG tablet TAKE ONE TABLET BY MOUTH TWICE DAILY WITH A MEAL (MORNING ,EVENING) 180 tablet 0  . pantoprazole (PROTONIX) 40 MG tablet Take 1 tablet (40 mg total) by mouth every evening. 90 tablet 3  . REPATHA SURECLICK 539 MG/ML SOAJ INJECT ONE DOSE INTO SKIN EVERY 14 DAYS. 2 mL 11  . rOPINIRole (REQUIP) 3 MG tablet TAKE ONE TABLET BY MOUTH EVERY MORNING, ,NOON AND AT BEDTIME. (PRT PT MORNING,EVENING,BEDTIME) 270 tablet 1  . torsemide (DEMADEX) 20 MG tablet Take 1 tablet (20 mg total) by mouth as needed. 90 tablet 0  . traMADol (ULTRAM) 50 MG tablet Take 100 mg by mouth 4 (four) times daily as needed.     . venlafaxine XR (EFFEXOR-XR) 150 MG 24 hr capsule Take 2 capsules (300 mg total) by mouth at bedtime. 60 capsule 2   No current facility-administered medications for this visit.     Musculoskeletal: Strength & Muscle Tone: decreased Gait & Station:  unsteady Patient leans: N/A  Psychiatric Specialty Exam: Review of Systems  Musculoskeletal: Positive for arthralgias, back pain and joint swelling.  Psychiatric/Behavioral: Positive for sleep disturbance.  All other systems reviewed and are negative.   There were no vitals taken for this visit.There is no height or weight on file to calculate BMI.  General Appearance: NA  Eye Contact:  NA  Speech:  Clear and Coherent  Volume:  Normal  Mood:  Euthymic  Affect:  NA  Thought Process:  Goal Directed  Orientation:  Full (Time, Place, and Person)  Thought Content: Rumination   Suicidal Thoughts:  No  Homicidal Thoughts:  No  Memory:  Immediate;   Good Recent;   Good Remote;   Good  Judgement:  Good  Insight:  Fair  Psychomotor Activity:  Decreased  Concentration:  Concentration: Good and Attention Span: Good  Recall:  Good  Fund of Knowledge: Good  Language: Good  Akathisia:  No  Handed:  Right  AIMS (if indicated): not done  Assets:  Communication Skills Desire for Improvement Resilience Social Support Talents/Skills  ADL's:  Intact  Cognition: WNL  Sleep:  Fair   Screenings: Mini-Mental   [place] Office Visit from 02/18/2018 in [facility] Office Visit from 09/17/2017 in  [facility]  Total Score (max 30 points ) 28 28    PHQ2-9   Flowsheet Row Video Visit from 08/03/2020 in [facility] from 05/17/2019 in [place] Office Visit from 09/11/2017 in [facility] Office Visit from 04/22/2017 in [facility] Office Visit from 12/06/2016 in [facility]  PHQ-2 Total Score 0 1 0 0 0    Flowsheet Row Video Visit from 08/03/2020 in [facility] from 06/20/2020 in [place] No Risk No Risk        Assessment and Plan: This patient is a 70 year old female with a history depression anxiety mood swings and insomnia she is doing well on her current regimen with no current tardive dyskinesia symptoms.  She will continue Wellbutrin XL 150 mg daily as well as Effexor XR 300 mg daily for depression, Lamictal 150 mg daily for mood swings and Xanax 0.25 mg at bedtime which she may repeat 1 time for sleep and anxiety.  She will return to see me in 3 months   [name], MD 08/03/2020, 11:27 AM

## 2020-08-05 ENCOUNTER — Encounter (INDEPENDENT_AMBULATORY_CARE_PROVIDER_SITE_OTHER): Payer: Self-pay

## 2020-08-07 ENCOUNTER — Ambulatory Visit: Payer: Medicare Other | Admitting: Family Medicine

## 2020-08-09 ENCOUNTER — Ambulatory Visit: Payer: Medicare Other | Admitting: Adult Health

## 2020-08-10 ENCOUNTER — Other Ambulatory Visit: Payer: Self-pay

## 2020-08-10 ENCOUNTER — Ambulatory Visit (INDEPENDENT_AMBULATORY_CARE_PROVIDER_SITE_OTHER): Payer: Medicare Other | Admitting: Orthopaedic Surgery

## 2020-08-10 DIAGNOSIS — M1711 Unilateral primary osteoarthritis, right knee: Secondary | ICD-10-CM

## 2020-08-10 DIAGNOSIS — M47817 Spondylosis without myelopathy or radiculopathy, lumbosacral region: Secondary | ICD-10-CM | POA: Diagnosis not present

## 2020-08-10 DIAGNOSIS — I2510 Atherosclerotic heart disease of native coronary artery without angina pectoris: Secondary | ICD-10-CM

## 2020-08-10 NOTE — Progress Notes (Signed)
Office Visit Note   Patient: Norma [name]           Date of Birth: 20-Jan-1951           MRN: [id number] Visit Date: 08/10/2020              Requested by: Celene Squibb, MD [place],  [place] [id number] PCP: Celene Squibb, MD   Assessment & Plan: Visit Diagnoses:  1. Lumbosacral spondylosis without myelopathy   2. Unilateral primary osteoarthritis, right knee     Plan: Right knee injection performed which gave her some relief.  She will continue to work on weight loss we discussed the importance of reaching her goal so that she could proceed with total knee arthroplasty.  Questions were elicited and answered.  X-rays were reviewed including x-ray showing her lumbar scoliosis and tricompartmental degenerative arthritis of her knee.  Follow-Up Instructions: No follow-ups on file.   Orders:  No orders of the defined types were placed in this encounter.  No orders of the defined types were placed in this encounter.     Procedures: Large Joint Inj: R knee on 08/14/2020 8:09 PM Indications: pain and joint swelling Details: 22 G 1.5 in needle, anterolateral approach  Arthrogram: No  Medications: 40 mg methylPREDNISolone acetate 40 MG/ML; 0.5 mL lidocaine 1 %; 4 mL bupivacaine 0.25 % Outcome: tolerated well, no immediate complications Procedure, treatment alternatives, risks and benefits explained, specific risks discussed. Consent was given by the patient. Immediately prior to procedure a time out was called to verify the correct patient, procedure, equipment, support staff and site/side marked as required. Patient was prepped and draped in the usual sterile fashion.       Clinical Data: No additional findings.   Subjective: Chief Complaint  Patient presents with  . Lower Back - Pain    HPI 70 year old female seen with her history of back pain and bilateral radicular pain states that occasionally when he gets her she feels like she is almost paralyzed for  a few seconds.  She is felt like she is going to fall and lose her balance.  Patient's used CBD roll-on which seems to help also Ultram, Voltaren gel which do not really seem to help.  She felt the Voltaren may have helped slightly.  CBD worked best.  Previous smoker quit 74.  She has bone-on-bone changes in her right knee and has been trying to lose weight to get her BMI down for total knee arthroplasty.  She is also had left middle finger fracture with nonunion but is able to flex her fingers.  Additional problems include IBS, arthritis, reflux, hypothyroidism, depression versus bipolar disorder.  Review of Systems All other systems noncontributory to HPI.  Objective: Vital Signs: There were no vitals taken for this visit.  Physical Exam Constitutional:      Appearance: She is well-developed.  HENT:     Head: Normocephalic.     Right Ear: External ear normal.     Left Ear: External ear normal.  Eyes:     Pupils: Pupils are equal, round, and reactive to light.  Neck:     Thyroid: No thyromegaly.     Trachea: No tracheal deviation.  Cardiovascular:     Rate and Rhythm: Normal rate.  Pulmonary:     Effort: Pulmonary effort is normal.  Abdominal:     Palpations: Abdomen is soft.  Skin:    General: Skin is warm and dry.  Neurological:  Mental Status: She is alert and oriented to person, place, and time.  Psychiatric:        Behavior: Behavior normal.     Ortho Exam patient has some sciatic notch tenderness right and left.  Right knee tenderness with 2+ knee effusion she is amatory with a right knee limp.  Distal pulses are intact.  Knee and ankle jerk are intact. Specialty Comments:  No specialty comments available.  Imaging: No results found. CLINICAL DATA:  Severe chronic back pain and bilateral leg pain, left greater than right.  EXAM: MRI LUMBAR SPINE WITHOUT CONTRAST  TECHNIQUE: Multiplanar, multisequence MR imaging of the lumbar spine was performed. No  intravenous contrast was administered.  COMPARISON:  MRI dated 06/10/2014  FINDINGS: Segmentation:  Standard.  Alignment:  Physiologic.  Vertebrae:  No fracture, evidence of discitis, or bone lesion.  Conus medullaris and cauda equina: Conus extends to the L1-2 level. Conus and cauda equina appear normal.  Paraspinal and other soft tissues: Negative.  Disc levels:  L1-2: Slight disc desiccation. Tiny central annular tear and tiny central disc bulge with no neural impingement, unchanged.  L2-3: Slight disc desiccation. Tiny broad-based disc bulge with no neural impingement. Slight hypertrophy of the ligamentum flavum and facet joints without neural impingement or significant spinal stenosis, essentially unchanged.  L3-4: Tiny broad-based disc bulge without neural impingement. Slight increased hypertrophy of the ligamentum flavum without significant spinal stenosis or neural impingement.  L4-5: Tiny broad-based disc bulge without neural impingement. Slight bilateral facet arthritis, unchanged. No neural impingement.  L5-S1: Slight disc desiccation. The disc is otherwise normal. Minimal bilateral facet arthritis, left greater than right, stable.  IMPRESSION: 1. No significant change in the appearance of the lumbar spine since 06/10/2014. 2. Minimal diffuse degenerative disc disease in the lumbar spine with no neural impingement, foraminal stenosis, or spinal stenosis. 3. Slight chronic facet arthritis in the lower lumbar spine.   Electronically Signed   By: Lorriane Shire M.D.   On: 09/30/2018 08:58   PMFS History: Patient Active Problem List   Diagnosis Date Noted  . Myeloproliferative disorder ([place]) 05/22/2020  . Thrombocytosis 05/22/2020  . Body mass index (BMI) 45.0-49.9, adult ([place]) 05/17/2020  . S/P ORIF (open reduction internal fixation) fracture left index finger 04/10/20 04/26/2020  . Open displaced fracture of proximal phalanx of left  index finger   . Cellulitis of left hand   . Dog bite, hand, left, initial encounter 04/09/2020  . Dog bite 04/09/2020  . Low back pain 03/01/2020  . Hypoxemia 09/23/2019  . Sleep related hypoxia 09/23/2019  . Obesity with alveolar hypoventilation and body mass index (BMI) of 40 or greater ([place]) 09/23/2019  . Hypersomnia with long sleep time, idiopathic 08/16/2019  . Insomnia due to other mental disorder 08/16/2019  . Dry mouth not due to sicca syndrome 08/16/2019  . Chronic coughing 08/16/2019  . Unilateral primary osteoarthritis, right knee 02/18/2019  . Bilateral primary osteoarthritis of knee 01/20/2019  . Diabetic neuropathy ([place]) 09/16/2018  . Unilateral primary osteoarthritis, left knee 09/16/2018  . Class 3 severe obesity due to excess calories with serious comorbidity and body mass index (BMI) of 45.0 to 49.9 in adult ([place]) 02/20/2018  . Mixed hyperlipidemia 09/11/2017  . Pressure injury of skin 09/02/2017  . Coronary artery disease involving native heart without angina pectoris   . S/P CABG x 3   . Leukocytosis   . Acute blood loss anemia   . Bipolar affective disorder ([place])   . Diabetes mellitus type  2 in obese ([place])   . History of CVA (cerebrovascular accident)   . Hypokalemia   . Hx of CABG 08/29/2017  . Coronary artery disease involving native coronary artery of native heart with unstable angina pectoris ([place]) 08/28/2017  . Unstable angina ([place]) 08/27/2017  . Cardiomyopathy ([place]) 08/22/2017  . Stroke (cerebrum) ([place]) 05/16/2017  . Anemia 09/19/2016  . Dizziness 06/21/2016  . Chest pain with moderate risk for cardiac etiology 04/23/2016  . Bipolar I disorder, most recent episode depressed ([place]) 06/05/2015  . DM type 2 causing vascular disease ([place]) 09/20/2014  . Chronic bilateral low back pain with bilateral sciatica 09/20/2014  . Morbid obesity, unspecified obesity type ([place]) 09/20/2014  . Memory difficulty 09/20/2014  . Lumbosacral spondylosis without myelopathy  12/25/2012  . Gastroparesis 11/04/2012  . Restless legs syndrome (RLS) 09/17/2012  . Headache(784.0) 03/19/2012  . GERD (gastroesophageal reflux disease) 02/16/2012  . Hypothyroidism 02/16/2012  . Tremor 02/16/2012  . Hyperlipidemia associated with type 2 diabetes mellitus ([place]) 04/01/2008  . OSA (obstructive sleep apnea) 04/01/2008  . Essential hypertension, benign 04/01/2008  . LBBB (left bundle branch block) 04/01/2008   Past Medical History:  Diagnosis Date  . Anemia   . Anxiety   . Bipolar disorder ([place])   . Bulging lumbar disc    L3-4  . Chronic daily headache   . Chronic low back pain 09/20/2014  . COPD (chronic obstructive pulmonary disease) ([place])   . Degenerative arthritis   . Depression   . Diabetes mellitus, type II ([place])   . Diabetic neuropathy ([place]) 09/16/2018  . DM type 2 with diabetic peripheral neuropathy ([place]) 09/20/2014  . Dyslipidemia   . Dyspnea   . Gastroparesis   . GERD (gastroesophageal reflux disease)   . Heart murmur   . History of hiatal hernia   . Hypothyroidism   . IBS (irritable bowel syndrome)   . Memory difficulty 09/20/2014  . Morbid obesity ([place])   . Neuropathy   . Obstructive sleep apnea on CPAP   . Restless legs syndrome (RLS) 09/17/2012  . Stroke (cerebrum) ([place]) 05/16/2017   Left parietal  . Urticaria     Family History  Problem Relation Age of Onset  . Hypertension Mother   . Lymphoma Mother   . Depression Mother   . Arthritis Father   . Alcohol abuse Father   . Hypertension Sister   . Cancer Brother        kidney and lung  . Alcohol abuse Brother   . Alcohol abuse Paternal Uncle   . Alcohol abuse Paternal Grandfather   . Alcohol abuse Paternal Grandmother   . Allergic rhinitis Neg Hx   . Angioedema Neg Hx   . Asthma Neg Hx   . Atopy Neg Hx   . Eczema Neg Hx   . Immunodeficiency Neg Hx   . Urticaria Neg Hx     Past Surgical History:  Procedure Laterality Date  . ABDOMINAL HYSTERECTOMY    . ARTHROSCOPY KNEE W/ DRILLING   06/2011   and Dece[place] of 2013.  [name] Carpel tunnel  1980's  . CATARACT EXTRACTION W/PHACO Right 03/21/2017   Procedure: CATARACT EXTRACTION PHACO AND INTRAOCULAR LENS PLACEMENT RIGHT EYE;  Surgeon: Baruch Goldmann, MD;  Location: [facility];  Service: Ophthalmology;  Laterality: Right;  CDE: 2.91   . CATARACT EXTRACTION W/PHACO Left 04/18/2017   Procedure: CATARACT EXTRACTION PHACO AND INTRAOCULAR LENS PLACEMENT LEFT EYE;  Surgeon: Baruch Goldmann, MD;  Location: [facility];  Service: Ophthalmology;  Laterality:  Left;  left  . CHOLECYSTECTOMY    . COLONOSCOPY WITH PROPOFOL N/A 10/04/2016   Procedure: COLONOSCOPY WITH PROPOFOL;  Surgeon: Rogene Houston, MD;  [facility];  Service: Endoscopy;  Laterality: N/A;  11:10  . CORONARY ARTERY BYPASS GRAFT N/A 08/29/2017   Procedure: CORONARY ARTERY BYPASS GRAFTING (CABG) x 3 WITH ENDOSCOPIC HARVESTING OF RIGHT SAPHENOUS VEIN;  Surgeon: Ivin Poot, MD;  Location: [facility];  Service: Open Heart Surgery;  Laterality: N/A;  . ESOPHAGOGASTRODUODENOSCOPY N/A 04/25/2016   Procedure: ESOPHAGOGASTRODUODENOSCOPY (EGD);  Surgeon: Rogene Houston, MD;  [facility];  Service: Endoscopy;  Laterality: N/A;  730  . LEFT HEART CATH AND CORONARY ANGIOGRAPHY N/A 08/27/2017   Procedure: LEFT HEART CATH AND CORONARY ANGIOGRAPHY;  Surgeon: Sherren Mocha, MD;  Location: [facility];  Service: Cardiovascular::  pLAD 95% - p-mLAD 50%, ostD1 90% -pD1 80%; ostOM1 90%; rPDA 80%. EF ~50-55% - HK of dital Anterolateral & Apical wall.  - Rec CVTS c/s  . OPEN REDUCTION INTERNAL FIXATION (ORIF) HAND Left 04/10/2020   Procedure: OPEN REDUCTION INTERNAL FIXATION (ORIF) HAND, left index finger;  Surgeon: Carole Civil, MD;  Location: [facility];  Service: Orthopedics;  Laterality: Left;  0.045 k wires  . RECTAL SURGERY     fissure  . SHOULDER SURGERY Left    arthroscopy in March of this year  . TEE WITHOUT CARDIOVERSION N/A 08/29/2017   Procedure: TRANSESOPHAGEAL  ECHOCARDIOGRAM (TEE);  Surgeon: Prescott Gum, Collier Salina, MD;  Location: [facility];  Service: Open Heart Surgery;  Laterality: N/A;  . TRANSTHORACIC ECHOCARDIOGRAM  06/06/2017   Mild to moderate reduced EF 40 and 45%.  Anterior septal, inferoseptal and basal to mid inferior hypokinesis.  GR 1 DD.  No significant valvular lesion   Social History   Occupational History    Employer: [employer]  Tobacco Use  . Smoking status: Former Smoker    Types: Cigarettes    Quit date: 02/04/1971    Years since quitting: 49.5  . Smokeless tobacco: Never Used  . Tobacco comment: smoked 2 cigarettes a day  Vaping Use  . Vaping Use: Never used  Substance and Sexual Activity  . Alcohol use: No    Alcohol/week: 0.0 standard drinks  . Drug use: No  . Sexual activity: Never

## 2020-08-14 ENCOUNTER — Ambulatory Visit: Payer: Medicare Other | Admitting: Orthopedic Surgery

## 2020-08-14 DIAGNOSIS — M1711 Unilateral primary osteoarthritis, right knee: Secondary | ICD-10-CM | POA: Diagnosis not present

## 2020-08-14 MED ORDER — METHYLPREDNISOLONE ACETATE 40 MG/ML IJ SUSP
40.0000 mg | INTRAMUSCULAR | Status: AC | PRN
  Administered 2020-08-14: 40 mg via INTRA_ARTICULAR

## 2020-08-14 MED ORDER — LIDOCAINE HCL 1 % IJ SOLN
0.5000 mL | INTRAMUSCULAR | Status: AC | PRN
  Administered 2020-08-14: .5 mL

## 2020-08-14 MED ORDER — BUPIVACAINE HCL 0.25 % IJ SOLN
4.0000 mL | INTRAMUSCULAR | Status: AC | PRN
  Administered 2020-08-14: 4 mL via INTRA_ARTICULAR

## 2020-08-17 ENCOUNTER — Ambulatory Visit: Payer: Medicare Other | Admitting: Family Medicine

## 2020-08-30 ENCOUNTER — Telehealth: Payer: Self-pay | Admitting: Neurology

## 2020-08-30 NOTE — Telephone Encounter (Signed)
Pt is asking that an order be put in for a new CPAP to her DME, please call pt to discuss

## 2020-08-31 NOTE — Telephone Encounter (Signed)
Patient returned call she states that she is trying to get a new machine from [facility].  She did receive oxygen through [facility] previously.  She told me that she originally got a machine from [facility]  back in 2015.  Then from [facility] who I talked to this morning that she had gotten the machine from there in 2019.  She says she tried to get a mask through them insurance would not pay.  I relayed to her that her insurance they usually require that she used her machine at least 5 years before they would approve another one.  I told her I would call [facility] and see what I could find out from them.  She stated she would call half months which I am glad.  Called Tawana at [facility] and she relayed that pt got oxygen concentrator 11-24-2019.  Has Luna cpap.  From 11-2019 transfer of care to [facility] for supplies and change of pressure.  She spoke to [facility] and they gave her machine in 2019.  She placed a small phone sim card in her machine but said was able to put in SD card into it as well.  ? If will record usage.  She will use it over weekend and take to them [facility]) next week and see if working. Then will let us know, to make appt for cpap f/u.

## 2020-08-31 NOTE — Telephone Encounter (Signed)
Called pt and LMVM for her to return call about new cpap request.   Received in  approx 11-2019 from  [facility] in [place].  I called and pt received machine from them,  Has SD card in machine he stated if pt wants to bring by they can read it.  Has not been contacted for supplies.

## 2020-09-04 DIAGNOSIS — I10 Essential (primary) hypertension: Secondary | ICD-10-CM | POA: Diagnosis not present

## 2020-09-04 DIAGNOSIS — E1165 Type 2 diabetes mellitus with hyperglycemia: Secondary | ICD-10-CM | POA: Diagnosis not present

## 2020-09-05 NOTE — Telephone Encounter (Signed)
Pt called, started my CPAP machine today, need to schedule my appt with Megan. Would like a call from the nurse.  I(Annie) could not see any dates available with in Pt's 31-90 days compliance. May you have some days available.

## 2020-09-06 NOTE — Telephone Encounter (Signed)
10-24-20 at [place] . Please call pt to relay date.  Thanks.

## 2020-09-06 NOTE — Telephone Encounter (Signed)
Called Pt to notify of scheduled appt 10/24/20 at 3:30p. Pt verbalized understood.

## 2020-09-11 DIAGNOSIS — E119 Type 2 diabetes mellitus without complications: Secondary | ICD-10-CM | POA: Diagnosis not present

## 2020-09-11 DIAGNOSIS — I10 Essential (primary) hypertension: Secondary | ICD-10-CM | POA: Diagnosis not present

## 2020-09-11 NOTE — Progress Notes (Signed)
Cardiology Office Note  Date: 09/12/2020   ID: Norma [name], DOB Oct 22, 1950, MRN [id number]  PCP:  Celene Squibb, MD  Cardiologist:  Carlyle Dolly, MD Electrophysiologist:  None   Chief Complaint: 1 month follow-up  History of Present Illness: Norma [name] is a 70 y.o. female with a history of unstable angina, CVA, LBBB, HTN, DM2, cardiomyopathy, OSA, morbid obesity, GERD, HLD, acute on chronic diastolic heart failure.  She was last seen by Dr. Harl Bowie on 07/07/2020.  She was taking torsemide as needed.  Having increased edema. Was having DOE from parking lot to the office.  She  stopped Lopressor due to fatigue.  She was continuing [place] with LDL at 43.  She was continued on CPAP followed by pulmonology.  Plan was to work on taking her as needed torsemide more consistently over the next few days and follow volume status.  Beta-blocker has been stopped due to fatigue.  She was experiencing no anginal symptoms and continuing current meds. EKG NSR and chronic LBBB.  She is here for 1 month follow-up.  She states she feels generally lousy today.  She states she feels fatigued with low energy.  States she is having some balance issues.  She states approximately 1 month ago her gabapentin dose was increased which seems to coincide with her increased feeling of fatigue and decreased energy along with balance issues.  She denies any current DOE or SOB.  Her weight today on our scales is 250.  She states her most recent weight was 244 at home.  She states she has been taking the torsemide daily without any real change in weight.  Previous echocardiogram demonstrated grade 1 diastolic dysfunction.  At last visit with Dr. Harl Bowie she was having DOE from parking lot to the office.  Today she is requesting increase in the dosage of torsemide.  Past Medical History:  Diagnosis Date  . Anemia   . Anxiety   . Bipolar disorder ([place])   . Bulging lumbar disc    L3-4  . Chronic daily headache   .  Chronic low back pain 09/20/2014  . COPD (chronic obstructive pulmonary disease) ([place])   . Degenerative arthritis   . Depression   . Diabetes mellitus, type II ([place])   . Diabetic neuropathy ([place]) 09/16/2018  . DM type 2 with diabetic peripheral neuropathy ([place]) 09/20/2014  . Dyslipidemia   . Dyspnea   . Gastroparesis   . GERD (gastroesophageal reflux disease)   . Heart murmur   . History of hiatal hernia   . Hypothyroidism   . IBS (irritable bowel syndrome)   . Memory difficulty 09/20/2014  . Morbid obesity ([place])   . Neuropathy   . Obstructive sleep apnea on CPAP   . Restless legs syndrome (RLS) 09/17/2012  . Stroke (cerebrum) ([place]) 05/16/2017   Left parietal  . Urticaria     Past Surgical History:  Procedure Laterality Date  . ABDOMINAL HYSTERECTOMY    . ARTHROSCOPY KNEE W/ DRILLING  06/2011   and Dece[place] of 2013.  [name] Carpel tunnel  1980's  . CATARACT EXTRACTION W/PHACO Right 03/21/2017   Procedure: CATARACT EXTRACTION PHACO AND INTRAOCULAR LENS PLACEMENT RIGHT EYE;  Surgeon: Baruch Goldmann, MD;  Location: [facility];  Service: Ophthalmology;  Laterality: Right;  CDE: 2.91   . CATARACT EXTRACTION W/PHACO Left 04/18/2017   Procedure: CATARACT EXTRACTION PHACO AND INTRAOCULAR LENS PLACEMENT LEFT EYE;  Surgeon: Baruch Goldmann, MD;  Location: [facility];  Service: Ophthalmology;  Laterality:  Left;  left  . CHOLECYSTECTOMY    . COLONOSCOPY WITH PROPOFOL N/A 10/04/2016   Procedure: COLONOSCOPY WITH PROPOFOL;  Surgeon: Rogene [place], MD;  Location: [facility];  Service: Endoscopy;  Laterality: N/A;  11:10  . CORONARY ARTERY BYPASS GRAFT N/A 08/29/2017   Procedure: CORONARY ARTERY BYPASS GRAFTING (CABG) x 3 WITH ENDOSCOPIC HARVESTING OF RIGHT SAPHENOUS VEIN;  Surgeon: Ivin Poot, MD;  Location: [facility];  Service: Open Heart Surgery;  Laterality: N/A;  . ESOPHAGOGASTRODUODENOSCOPY N/A 04/25/2016   Procedure: ESOPHAGOGASTRODUODENOSCOPY (EGD);  Surgeon: Rogene [place], MD;  [facility];   Service: Endoscopy;  Laterality: N/A;  730  . LEFT HEART CATH AND CORONARY ANGIOGRAPHY N/A 08/27/2017   Procedure: LEFT HEART CATH AND CORONARY ANGIOGRAPHY;  Surgeon: Sherren Mocha, MD;  Location: [facility];  Service: Cardiovascular::  pLAD 95% - p-mLAD 50%, ostD1 90% -pD1 80%; ostOM1 90%; rPDA 80%. EF ~50-55% - HK of dital Anterolateral & Apical wall.  - Rec CVTS c/s  . OPEN REDUCTION INTERNAL FIXATION (ORIF) HAND Left 04/10/2020   Procedure: OPEN REDUCTION INTERNAL FIXATION (ORIF) HAND, left index finger;  Surgeon: Carole Civil, MD;  Location: [facility];  Service: Orthopedics;  Laterality: Left;  0.045 k wires  . RECTAL SURGERY     fissure  . SHOULDER SURGERY Left    arthroscopy in March of this year  . TEE WITHOUT CARDIOVERSION N/A 08/29/2017   Procedure: TRANSESOPHAGEAL ECHOCARDIOGRAM (TEE);  Surgeon: Prescott Gum, Collier Salina, MD;  Location: [facility];  Service: Open Heart Surgery;  Laterality: N/A;  . TRANSTHORACIC ECHOCARDIOGRAM  06/06/2017   Mild to moderate reduced EF 40 and 45%.  Anterior septal, inferoseptal and basal to mid inferior hypokinesis.  GR 1 DD.  No significant valvular lesion    Current Outpatient Medications  Medication Sig Dispense Refill  . ALPRAZolam (XANAX) 0.25 MG tablet Take 1 tablet (0.25 mg total) by mouth 2 (two) times daily as needed for anxiety. 60 tablet 2  . aspirin EC 81 MG tablet Take 1 tablet (81 mg total) by mouth daily. 90 tablet 3  . baclofen (LIORESAL) 10 MG tablet Take 1 tablet (10 mg total) by mouth at bedtime. 30 tablet 6  . buPROPion (WELLBUTRIN XL) 150 MG 24 hr tablet Take 1 tablet (150 mg total) by mouth every morning. 30 tablet 2  . calcium carbonate (TUMS - DOSED IN MG ELEMENTAL CALCIUM) 500 MG chewable tablet Chew 2 tablets by mouth as needed for indigestion or heartburn.     . carvedilol (COREG) 6.25 MG tablet TAKE ONE TABLET BY MOUTH TWICE DAILY (MORNING ,EVENING) 180 tablet 2  . cetirizine (ZYRTEC) 10 MG tablet Take 1 tablet tablet 1-2  times daily. (Patient taking differently: Take by mouth at bedtime.) 60 tablet 5  . diclofenac Sodium (VOLTAREN) 1 % GEL Apply topically 4 (four) times daily.    . diphenhydrAMINE (BENADRYL) 25 mg capsule Take 25 mg by mouth as needed.    [name] EPINEPHrine 0.3 mg/0.3 mL IJ SOAJ injection Inject 0.3 mg into the muscle once.    . ferrous sulfate 325 (65 FE) MG tablet Take 325 mg by mouth daily after supper.    . gabapentin (NEURONTIN) 400 MG capsule Take 1 capsule in the morning, take 2 midday, take 1 at night 360 capsule 1  . glipiZIDE (GLUCOTROL XL) 5 MG 24 hr tablet TAKE ONE TABLET BY MOUTH DAILY WITH BREAKFAST 90 tablet 0  . lamoTRIgine (LAMICTAL) 150 MG tablet Take  1 tablet (150 mg total) by mouth at bedtime. 28 tablet 2  . levothyroxine (SYNTHROID, LEVOTHROID) 100 MCG tablet Take 100 mcg by mouth daily before breakfast.    . losartan (COZAAR) 25 MG tablet TAKE ONE TABLET BY MOUTH DAILY (MORNING) 90 tablet 2  . meclizine (ANTIVERT) 25 MG tablet Take 1 tablet (25 mg total) by mouth 3 (three) times daily as needed for dizziness. (Patient taking differently: Take 25 mg by mouth daily as needed for dizziness.) 30 tablet 0  . metFORMIN (GLUCOPHAGE) 500 MG tablet TAKE ONE TABLET BY MOUTH TWICE DAILY WITH A MEAL (MORNING ,EVENING) 180 tablet 0  . pantoprazole (PROTONIX) 40 MG tablet Take 1 tablet (40 mg total) by mouth every evening. 90 tablet 3  . REPATHA SURECLICK 497 MG/ML SOAJ INJECT ONE DOSE INTO SKIN EVERY 14 DAYS. 2 mL 11  . rOPINIRole (REQUIP) 3 MG tablet TAKE ONE TABLET BY MOUTH EVERY MORNING, ,NOON AND AT BEDTIME. (PRT PT MORNING,EVENING,BEDTIME) 270 tablet 1  . torsemide (DEMADEX) 20 MG tablet Take 1 tablet (20 mg total) by mouth as needed. 90 tablet 0  . traMADol (ULTRAM) 50 MG tablet Take 100 mg by mouth 4 (four) times daily as needed.     . venlafaxine XR (EFFEXOR-XR) 150 MG 24 hr capsule Take 2 capsules (300 mg total) by mouth at bedtime. 60 capsule 2   No current facility-administered  medications for this visit.   Allergies:  Amoxicillin, Hydrocodone, Percocet [oxycodone-acetaminophen], Vancomycin, and Depacon [valproic acid]   Social History: The patient  reports that she quit smoking about 49 years ago. Her smoking use included cigarettes. She has never used smokeless tobacco. She reports that she does not drink alcohol and does not use drugs.   Family History: The patient's family history includes Alcohol abuse in her brother, father, paternal grandfather, paternal grandmother, and paternal uncle; Arthritis in her father; Cancer in her brother; Depression in her mother; Hypertension in her mother and sister; Lymphoma in her mother.   ROS:  Please see the history of present illness. Otherwise, complete review of systems is positive for none.  All other systems are reviewed and negative.   Physical Exam: VS:  BP 132/64   Pulse 77   Ht 5' (1.524 m)   Wt 250 lb (113.4 kg)   SpO2 92%   BMI 48.82 kg/m , BMI Body mass index is 48.82 kg/m.  Wt Readings from Last 3 Encounters:  09/12/20 250 lb (113.4 kg)  07/10/20 249 lb (112.9 kg)  07/07/20 250 lb 12.8 oz (113.8 kg)    General: Morbidly obese patient appears comfortable at rest. Neck: Supple, no elevated JVP or carotid bruits, no thyromegaly. Lungs: Clear to auscultation, nonlabored breathing at rest. Cardiac: Regular rate and rhythm, no S3 or significant systolic murmur, no pericardial rub. Extremities: No pitting edema, distal pulses 2+. Skin: Warm and dry. Musculoskeletal: No kyphosis. Neuropsychiatric: Alert and oriented x3, affect grossly appropriate.  ECG:  EKG July 07, 2020 normal sinus rhythm rate 87, LBBB  Recent Labwork: 04/09/2020: ALT 14; AST 19 04/11/2020: BUN 19; Creatinine, Ser 1.11; Hemoglobin 10.1; Platelets 352; Potassium 4.5; Sodium 138     Component Value Date/Time   CHOL 129 11/08/2019 0000   TRIG 158 11/08/2019 0000   HDL 60 11/08/2019 0000   CHOLHDL 3.8 03/05/2019 1221   VLDL 31 (H)  07/10/2016 0934   LDLCALC 43 11/08/2019 0000   LDLCALC 152 (H) 03/05/2019 1221    Other Studies Reviewed Today:  03/2016 [facility]  Probable normal perfusion and mild soft tissue attenuation (breast) No significant ischemia or evidence for scar  The left ventricular ejection fraction is normal (55-65%).  Low risk scan.  08/2017 cath  RPDA lesion is 80% stenosed.  Ost 1st Mrg to 1st Mrg lesion is 90% stenosed.  Prox LAD lesion is 95% stenosed.  Prox LAD to Mid LAD lesion is 50% stenosed.  Ost 1st Diag lesion is 90% stenosed.  1st Diag lesion is 80% stenosed.  The left ventricular ejection fraction is 50-55% by visual estimate.  There is mild left ventricular systolic dysfunction.  1. Severe multivessel coronary artery disease with severe stenosis of the proximal LAD/first diagonal bifurcation, first obtuse marginal branch of the circumflex, and PDA branch of the RCA. 2. Mild segmental LV systolic dysfunction with hypokinesis of the distal anterolateral and apical walls, LVEF estimated at 50 to 55%.  08/2017 TEE  Aortic valve: The valve is trileaflet. No stenosis. No regurgitation.  Mitral valve: Mild regurgitation. Central jet.  Right ventricle: Normal cavity size, wall thickness and ejection fraction.  Pericardium: Trivial pericardial effusion.  Tricuspid valve: No regurgitation  Pulmonic valve: No regurgitation by color doppler.  Left ventricle: Regional wall motion abnormalities present, dyskinetic basal and apical septal wall, hypokinesis of lateral wall and inferoseptal wall. Increased wall thickness. LVEF 35-40%.  No ASD/PFO present  Unable to rule out thrombus in LAA but velocities make thrombus less likely.  05/2017 echo Study Conclusions  - Left ventricle: The cavity size was normal. There was mild concentric hypertrophy. Systolic function was mildly to moderately reduced. The estimated ejection fraction was in the range of 40% to 45%.  Hypokinesis of the anteroseptal, inferoseptal and basal to mid inferior myocardium. Doppler parameters are consistent with abnormal left ventricular relaxation (grade 1 diastolic dysfunction). Doppler parameters are consistent with indeterminate ventricular filling pressure. - Aortic valve: Transvalvular velocity was within the normal range. There was no stenosis. There was no regurgitation. - Mitral valve: Transvalvular velocity was within the normal range. There was no evidence for stenosis. There was no regurgitation. - Right ventricle: The cavity size was normal. Wall thickness was normal. Systolic function was normal. - Tricuspid valve: There was trivial regurgitation. - Global longitudinal strain -11.9%.   01/2018 echo Study Conclusions  - Left ventricle: The cavity size was normal. Wall thickness was increased in a pattern of mild LVH. Systolic function was normal. The estimated ejection fraction was in the range of 50% to 55%. Regional wall motion abnormalities cannot be excluded. Doppler parameters are consistent with abnormal left ventricular relaxation (grade 1 diastolic dysfunction). - Ventricular septum: Septal motion showed abnormal function and dyssynergy. These changes are consistent with a left bundle branch block. - Mitral valve: Mildly thickened leaflets .   Assessment and Plan:  1. Chronic diastolic heart failure ([place])   2. CAD in native artery    1. Chronic diastolic heart failure ([facility])  Continues to complain of fatigue/low energy/general malaise.  Please get a follow-up echocardiogram to reassess LV function, diastolic function, valvular function.  Patient has actually gained a pound since last visit.  Increase torsemide to 20 mg every other day alternating with 40 mg every other day for the next 2 weeks.  Continue carvedilol 6.25 mg p.o. twice daily.  Get a basic metabolic panel and magnesium at Dr. Juel Burrow office during your  visit on Monday.  Continue losartan 25 mg p.o. daily  2. CAD in native artery Denies any anginal symptoms.  Continue aspirin 81 mg daily.  3. Diastolic dysfunction Last echocardiogram October 2019 EF of 50 to 55%.  Mild LVH.  Regional wall variation abnormalities could not be excluded.  G1 DD.  Abnormal septal motion and dyssynergy most consistent with LBBB.  Repeat echocardiogram to reassess LV function, diastolic function and valvular function.  4. Benign essential HTN BP 132/64 today.  Continue losartan 25 mg daily.  Medication Adjustments/Labs and Tests Ordered: Current medicines are reviewed at length with the patient today.  Concerns regarding medicines are outlined above.   Disposition: Follow-up with Dr. Harl Bowie or APP 6 to 8 weeks  Signed, Levell July, NP 09/12/2020 2:22 PM    [facility] at [place], [place], [place] [address] Phone: [phone]; Fax: [phone]

## 2020-09-12 ENCOUNTER — Ambulatory Visit (INDEPENDENT_AMBULATORY_CARE_PROVIDER_SITE_OTHER): Payer: Medicare Other | Admitting: Family Medicine

## 2020-09-12 ENCOUNTER — Encounter: Payer: Self-pay | Admitting: Family Medicine

## 2020-09-12 VITALS — BP 132/64 | HR 77 | Ht 60.0 in | Wt 250.0 lb

## 2020-09-12 DIAGNOSIS — M79676 Pain in unspecified toe(s): Secondary | ICD-10-CM | POA: Diagnosis not present

## 2020-09-12 DIAGNOSIS — E1142 Type 2 diabetes mellitus with diabetic polyneuropathy: Secondary | ICD-10-CM | POA: Diagnosis not present

## 2020-09-12 DIAGNOSIS — I10 Essential (primary) hypertension: Secondary | ICD-10-CM | POA: Diagnosis not present

## 2020-09-12 DIAGNOSIS — I5189 Other ill-defined heart diseases: Secondary | ICD-10-CM

## 2020-09-12 DIAGNOSIS — I2510 Atherosclerotic heart disease of native coronary artery without angina pectoris: Secondary | ICD-10-CM

## 2020-09-12 DIAGNOSIS — B351 Tinea unguium: Secondary | ICD-10-CM | POA: Diagnosis not present

## 2020-09-12 DIAGNOSIS — I5032 Chronic diastolic (congestive) heart failure: Secondary | ICD-10-CM

## 2020-09-12 DIAGNOSIS — L84 Corns and callosities: Secondary | ICD-10-CM | POA: Diagnosis not present

## 2020-09-12 NOTE — Patient Instructions (Addendum)
Medication Instructions:   Alternate your Torsemide 20mg  / 40mg  every other day x 2 weeks, then back to as needed.   Continue all current medications.  Labwork:  BMET, Mg - orders given today.   Will do at her pcp office in 1 week.  Testing/Procedures:  Your physician has requested that you have an echocardiogram. Echocardiography is a painless test that uses sound waves to create images of your heart. It provides your doctor with information about the size and shape of your heart and how well your heart's chambers and valves are working. This procedure takes approximately one hour. There are no restrictions for this procedure.  Office will contact with results via phone or letter.    Follow-Up: 6 - 8 weeks   Any Other Special Instructions Will Be Listed Below (If Applicable).  If you need a refill on your cardiac medications before your next appointment, please call your pharmacy.

## 2020-09-14 DIAGNOSIS — I5032 Chronic diastolic (congestive) heart failure: Secondary | ICD-10-CM | POA: Diagnosis not present

## 2020-09-14 DIAGNOSIS — D509 Iron deficiency anemia, unspecified: Secondary | ICD-10-CM | POA: Diagnosis not present

## 2020-09-14 DIAGNOSIS — E039 Hypothyroidism, unspecified: Secondary | ICD-10-CM | POA: Diagnosis not present

## 2020-09-18 ENCOUNTER — Other Ambulatory Visit: Payer: Self-pay | Admitting: Cardiology

## 2020-09-25 ENCOUNTER — Encounter (INDEPENDENT_AMBULATORY_CARE_PROVIDER_SITE_OTHER): Payer: Self-pay

## 2020-09-26 ENCOUNTER — Telehealth (HOSPITAL_COMMUNITY): Payer: Self-pay | Admitting: *Deleted

## 2020-09-26 NOTE — Telephone Encounter (Signed)
Patient is needing refills for all of her medications provider prescribes for her. Patient appt was cancelled due to provider being on medical leave. Message was left to call office to resch appt.  

## 2020-09-27 ENCOUNTER — Other Ambulatory Visit (HOSPITAL_COMMUNITY): Payer: Self-pay | Admitting: Psychiatry

## 2020-09-27 MED ORDER — LAMOTRIGINE 150 MG PO TABS: 150.0000 mg | ORAL_TABLET | Freq: Every day | ORAL | 2 refills | Status: DC

## 2020-09-27 MED ORDER — VENLAFAXINE HCL ER 150 MG PO CP24: 300.0000 mg | ORAL_CAPSULE | Freq: Every day | ORAL | 2 refills | Status: DC

## 2020-09-27 MED ORDER — ALPRAZOLAM 0.25 MG PO TABS: 0.2500 mg | ORAL_TABLET | Freq: Two times a day (BID) | ORAL | 2 refills | Status: DC | PRN

## 2020-09-27 MED ORDER — BUPROPION HCL ER (XL) 150 MG PO TB24: 150.0000 mg | ORAL_TABLET | ORAL | 2 refills | Status: DC

## 2020-09-27 NOTE — Telephone Encounter (Signed)
sent 

## 2020-09-28 ENCOUNTER — Ambulatory Visit: Payer: Medicare Other | Admitting: Orthopedic Surgery

## 2020-09-29 DIAGNOSIS — I10 Essential (primary) hypertension: Secondary | ICD-10-CM | POA: Diagnosis not present

## 2020-09-29 DIAGNOSIS — E119 Type 2 diabetes mellitus without complications: Secondary | ICD-10-CM | POA: Diagnosis not present

## 2020-10-04 ENCOUNTER — Telehealth: Payer: Self-pay | Admitting: Orthopedic Surgery

## 2020-10-04 NOTE — Telephone Encounter (Signed)
Patient called back to reschedule for new problem/new injury - a fall, for right knee/right leg pain. Patient cancelled the original appointment for this issue due to illness, and had rescheduled. Patient then requested to cancel again, due to the heat. She has requested to reschedule again, however, has been seen by Dr Durward Fortes in the interim. States Dr Durward Fortes sees her for her back, although did also see her for this knee problem "and gave an injection".  I relayed it is therefore now a 2nd opinion.  Please review and advise.

## 2020-10-05 NOTE — Telephone Encounter (Signed)
Called patient; relayed per Dr Ruthe Mannan response; scheduled accordingly. Patient aware.

## 2020-10-12 ENCOUNTER — Other Ambulatory Visit: Payer: Self-pay | Admitting: Family Medicine

## 2020-10-12 DIAGNOSIS — I5189 Other ill-defined heart diseases: Secondary | ICD-10-CM

## 2020-10-12 DIAGNOSIS — I2510 Atherosclerotic heart disease of native coronary artery without angina pectoris: Secondary | ICD-10-CM

## 2020-10-12 DIAGNOSIS — I429 Cardiomyopathy, unspecified: Secondary | ICD-10-CM

## 2020-10-18 ENCOUNTER — Ambulatory Visit (INDEPENDENT_AMBULATORY_CARE_PROVIDER_SITE_OTHER): Payer: Medicare Other

## 2020-10-18 DIAGNOSIS — I5189 Other ill-defined heart diseases: Secondary | ICD-10-CM | POA: Diagnosis not present

## 2020-10-18 DIAGNOSIS — I2510 Atherosclerotic heart disease of native coronary artery without angina pectoris: Secondary | ICD-10-CM

## 2020-10-18 DIAGNOSIS — I429 Cardiomyopathy, unspecified: Secondary | ICD-10-CM

## 2020-10-18 LAB — ECHOCARDIOGRAM COMPLETE
Area-P 1/2: 3.77 cm2
Calc EF: 50.5 %
MV M vel: 3.19 m/s
MV Peak grad: 40.7 mmHg
S' Lateral: 3.62 cm
Single Plane A2C EF: 48.8 %
Single Plane A4C EF: 47.2 %

## 2020-10-23 ENCOUNTER — Ambulatory Visit: Payer: Medicare Other

## 2020-10-23 ENCOUNTER — Ambulatory Visit (INDEPENDENT_AMBULATORY_CARE_PROVIDER_SITE_OTHER): Payer: Medicare Other | Admitting: Orthopedic Surgery

## 2020-10-23 ENCOUNTER — Encounter: Payer: Self-pay | Admitting: Orthopedic Surgery

## 2020-10-23 ENCOUNTER — Other Ambulatory Visit: Payer: Self-pay

## 2020-10-23 VITALS — BP 151/68 | HR 111 | Ht 61.0 in | Wt 250.0 lb

## 2020-10-23 DIAGNOSIS — I2510 Atherosclerotic heart disease of native coronary artery without angina pectoris: Secondary | ICD-10-CM

## 2020-10-23 DIAGNOSIS — Z6841 Body Mass Index (BMI) 40.0 and over, adult: Secondary | ICD-10-CM

## 2020-10-23 DIAGNOSIS — M5410 Radiculopathy, site unspecified: Secondary | ICD-10-CM

## 2020-10-23 MED ORDER — MELOXICAM 7.5 MG PO TABS: 7.5000 mg | ORAL_TABLET | Freq: Every day | ORAL | 5 refills | Status: DC

## 2020-10-23 NOTE — Progress Notes (Signed)
[facility] NEW PROBLEM//OFFICE VISIT  Summary assessment and plan:   Norma [name] she has a history of lower back pain she has had x-rays and MRI within the last year she comes in with bilateral shin pain radiating from the knees down to the feet.  She does distinguish this from her chronic lower back pain and denies radicular symptoms from the back or hip  This is really bothering her.  She has arthritis of the knee she is followed by Dr. Durward Fortes.  He says she needs to lose weight and then have knee replacement surgery  She was on Arthrotec but secondary to gastroparesis and reflux Dr. Melony Overly took her off her Arthrotec which she says worked really well  She had did not have any bleeding and is taking her Protonix.  I recommend she take her Protonix and we put her on Mobic as she is not an operative candidate due to obesity.  She will call us for any GI symptoms I will see her in 30 days  Meds ordered this encounter  Medications   meloxicam (MOBIC) 7.5 MG tablet    Sig: Take 1 tablet (7.5 mg total) by mouth daily.    Dispense:  30 tablet    Refill:  5     Chief Complaint  Patient presents with   Leg Pain    Bilateral both legs hurt left shin is the worst   Norma [name] she has a history of lower back pain she has had x-rays and MRI within the last year she comes in with bilateral shin pain radiating from the knees down to the feet.  She does distinguish this from her chronic lower back pain and denies radicular symptoms from the back or hip  This is really bothering her.  She has arthritis of the knee she is followed by Dr. Durward Fortes.  He says she needs to lose weight and then have knee replacement surgery  She was on Arthrotec but secondary to gastroparesis and reflux Dr. Melony Overly took her off her Arthrotec which she says worked really well  She had did not have any bleeding and is taking her Protonix.  The pain in her shins occurs with weightbearing      MEDICAL DECISION  MAKING  A.  Encounter Diagnoses  Name Primary?   Radicular pain of both lower extremities Yes   Body mass index 45.0-49.9, adult (HCC)    Morbid obesity ([place])     B. DATA ANALYSED:   IMAGING: Interpretation of images: Outside images were unavailable for review in terms of the lumbar series but the report indicates she does have lower lumbar spine and degenerative disc disease.  She did have an MRI and I have that report it stated 2020  No significant change tiny broad-based disc bulge without neural impingement at L3-4 hypertrophied ligamentum flavum without spinal stenosis similar findings at L4-5 then at L2-3 similar changes as well bottom line no foraminal or spinal stenosis no neural impingement  Orders: None  Outside records reviewed: No   C. MANAGEMENT   Nonoperative  Meds ordered this encounter  Medications   meloxicam (MOBIC) 7.5 MG tablet    Sig: Take 1 tablet (7.5 mg total) by mouth daily.    Dispense:  30 tablet    Refill:  5      BP (!) 151/68   Pulse (!) 111   Ht 5\' 1"  (1.549 m)   Wt 250 lb (113.4 kg)   BMI 47.24 kg/m   The  patient meets the AMA guidelines for Morbid (severe) obesity with a BMI > 40.0 and I have recommended weight loss.  General appearance: Well-developed well-nourished no gross deformities  Cardiovascular normal pulse and perfusion normal color with bilateral lower leg edema Neurologically no sensation loss or deficits or pathologic reflexes  Psychological: Awake alert and oriented x3 mood and affect normal  Skin no lacerations or ulcerations no nodularity no palpable masses, no erythema or nodularity  Musculoskeletal:   She has mild tenderness to palpation in both shins.   Review of Systems  Constitutional:  Negative for chills, fever, malaise/fatigue and weight loss.  Gastrointestinal:  Negative for constipation.       Denies loss bowel control   Genitourinary:        Denies urinary retention or los of bladder control      Past Medical History:  Diagnosis Date   Anemia    Anxiety    Bipolar disorder ([place])    Bulging lumbar disc    L3-4   Chronic daily headache    Chronic low back pain 09/20/2014   COPD (chronic obstructive pulmonary disease) (HCC)    Degenerative arthritis    Depression    Diabetes mellitus, type II ([place])    Diabetic neuropathy ([place]) 09/16/2018   DM type 2 with diabetic peripheral neuropathy ([place]) 09/20/2014   Dyslipidemia    Dyspnea    Gastroparesis    GERD (gastroesophageal reflux disease)    Heart murmur    History of hiatal hernia    Hypothyroidism    IBS (irritable bowel syndrome)    Memory difficulty 09/20/2014   Morbid obesity ([place])    Neuropathy    Obstructive sleep apnea on CPAP    Restless legs syndrome (RLS) 09/17/2012   Stroke (cerebrum) ([place]) 05/16/2017   Left parietal   Urticaria     Past Surgical History:  Procedure Laterality Date   ABDOMINAL HYSTERECTOMY     ARTHROSCOPY KNEE W/ DRILLING  06/2011   and Decemer of 2013.   Carpel tunnel  1980's   CATARACT EXTRACTION W/PHACO Right 03/21/2017   Procedure: CATARACT EXTRACTION PHACO AND INTRAOCULAR LENS PLACEMENT RIGHT EYE;  Surgeon: Baruch Goldmann, MD;  Location: [facility];  Service: Ophthalmology;  Laterality: Right;  CDE: 2.91    CATARACT EXTRACTION W/PHACO Left 04/18/2017   Procedure: CATARACT EXTRACTION PHACO AND INTRAOCULAR LENS PLACEMENT LEFT EYE;  Surgeon: Baruch Goldmann, MD;  Location: [facility];  Service: Ophthalmology;  Laterality: Left;  left   CHOLECYSTECTOMY     COLONOSCOPY WITH PROPOFOL N/A 10/04/2016   Procedure: COLONOSCOPY WITH PROPOFOL;  Surgeon: Rogene Houston, MD;  [facility]: [facility];  Service: Endoscopy;  Laterality: N/A;  11:10   CORONARY ARTERY BYPASS GRAFT N/A 08/29/2017   Procedure: CORONARY ARTERY BYPASS GRAFTING (CABG) x 3 WITH ENDOSCOPIC HARVESTING OF RIGHT SAPHENOUS VEIN;  Surgeon: Ivin Poot, MD;  Location: [facility];  Service: Open Heart Surgery;  Laterality: N/A;    ESOPHAGOGASTRODUODENOSCOPY N/A 04/25/2016   Procedure: ESOPHAGOGASTRODUODENOSCOPY (EGD);  Surgeon: Rogene Houston, MD;  [facility];  Service: Endoscopy;  Laterality: N/A;  730   LEFT HEART CATH AND CORONARY ANGIOGRAPHY N/A 08/27/2017   Procedure: LEFT HEART CATH AND CORONARY ANGIOGRAPHY;  Surgeon: Sherren Mocha, MD;  Location: [facility];  Service: Cardiovascular::  pLAD 95% - p-mLAD 50%, ostD1 90% -pD1 80%; ostOM1 90%; rPDA 80%. EF ~50-55% - HK of dital Anterolateral & Apical wall.  - Rec CVTS c/s   OPEN  REDUCTION INTERNAL FIXATION (ORIF) HAND Left 04/10/2020   Procedure: OPEN REDUCTION INTERNAL FIXATION (ORIF) HAND, left index finger;  Surgeon: Carole Civil, MD;  Location: [facility];  Service: Orthopedics;  Laterality: Left;  0.045 k wires   RECTAL SURGERY     fissure   SHOULDER SURGERY Left    arthroscopy in March of this year   TEE WITHOUT CARDIOVERSION N/A 08/29/2017   Procedure: TRANSESOPHAGEAL ECHOCARDIOGRAM (TEE);  Surgeon: Prescott Gum, Collier Salina, MD;  Location: [facility];  Service: Open Heart Surgery;  Laterality: N/A;   TRANSTHORACIC ECHOCARDIOGRAM  06/06/2017   Mild to moderate reduced EF 40 and 45%.  Anterior septal, inferoseptal and basal to mid inferior hypokinesis.  GR 1 DD.  No significant valvular lesion    Family History  Problem Relation Age of Onset   Hypertension Mother    Lymphoma Mother    Depression Mother    Arthritis Father    Alcohol abuse Father    Hypertension Sister    Cancer Brother        kidney and lung   Alcohol abuse Brother    Alcohol abuse Paternal Uncle    Alcohol abuse Paternal Grandfather    Alcohol abuse Paternal Grandmother    Allergic rhinitis Neg Hx    Angioedema Neg Hx    Asthma Neg Hx    Atopy Neg Hx    Eczema Neg Hx    Immunodeficiency Neg Hx    Urticaria Neg Hx    Social History   Tobacco Use   Smoking status: Former    Pack years: 0.00    Types: Cigarettes    Quit date: 02/04/1971    Years since quitting:  49.7   Smokeless tobacco: Never   Tobacco comments:    smoked 2 cigarettes a day  Vaping Use   Vaping Use: Never used  Substance Use Topics   Alcohol use: No    Alcohol/week: 0.0 standard drinks   Drug use: No    Allergies  Allergen Reactions   Amoxicillin Anaphylaxis and Other (See Comments)    Has patient had a PCN reaction causing immediate rash, facial/tongue/throat swelling, SOB or lightheadedness with hypotension: Yes Has patient had a PCN reaction causing severe rash involving mucus membranes or skin necrosis: Yes Has patient had a PCN reaction that required hospitalization: Yes Has patient had a PCN reaction occurring within the last 10 years: Yes If all of the above answers are "NO", then may proceed with Cephalosporin use.    Hydrocodone Anaphylaxis   Percocet [Oxycodone-Acetaminophen] Other (See Comments)    Causes chest pain /tightness. Pressure pain around her ribs.   Vancomycin Itching   Depacon [Valproic Acid] Other (See Comments)    Causes falls     Current Meds  Medication Sig   ALPRAZolam (XANAX) 0.25 MG tablet Take 1 tablet (0.25 mg total) by mouth 2 (two) times daily as needed for anxiety.   aspirin EC 81 MG tablet Take 1 tablet (81 mg total) by mouth daily.   baclofen (LIORESAL) 10 MG tablet Take 1 tablet (10 mg total) by mouth at bedtime.   buPROPion (WELLBUTRIN XL) 150 MG 24 hr tablet Take 1 tablet (150 mg total) by mouth every morning.   calcium carbonate (TUMS - DOSED IN MG ELEMENTAL CALCIUM) 500 MG chewable tablet Chew 2 tablets by mouth as needed for indigestion or heartburn.    carvedilol (COREG) 6.25 MG tablet TAKE ONE TABLET BY MOUTH TWICE DAILY (MORNING ,EVENING)   cetirizine (ZYRTEC)  10 MG tablet Take 1 tablet tablet 1-2 times daily. (Patient taking differently: Take by mouth at bedtime.)   diclofenac Sodium (VOLTAREN) 1 % GEL Apply topically 4 (four) times daily.   diphenhydrAMINE (BENADRYL) 25 mg capsule Take 25 mg by mouth as needed.    EPINEPHrine 0.3 mg/0.3 mL IJ SOAJ injection Inject 0.3 mg into the muscle once.   ferrous sulfate 325 (65 FE) MG tablet Take 325 mg by mouth daily after supper.   gabapentin (NEURONTIN) 400 MG capsule Take 1 capsule in the morning, take 2 midday, take 1 at night   glipiZIDE (GLUCOTROL XL) 5 MG 24 hr tablet TAKE ONE TABLET BY MOUTH DAILY WITH BREAKFAST   lamoTRIgine (LAMICTAL) 150 MG tablet Take 1 tablet (150 mg total) by mouth at bedtime.   levothyroxine (SYNTHROID, LEVOTHROID) 100 MCG tablet Take 100 mcg by mouth daily before breakfast.   losartan (COZAAR) 25 MG tablet TAKE ONE TABLET BY MOUTH DAILY (MORNING)   meclizine (ANTIVERT) 25 MG tablet Take 1 tablet (25 mg total) by mouth 3 (three) times daily as needed for dizziness. (Patient taking differently: Take 25 mg by mouth daily as needed for dizziness.)   meloxicam (MOBIC) 7.5 MG tablet Take 1 tablet (7.5 mg total) by mouth daily.   metFORMIN (GLUCOPHAGE) 500 MG tablet TAKE ONE TABLET BY MOUTH TWICE DAILY WITH A MEAL (MORNING ,EVENING)   pantoprazole (PROTONIX) 40 MG tablet Take 1 tablet (40 mg total) by mouth every evening.   REPATHA SURECLICK 009 MG/ML SOAJ INJECT ONE DOSE INTO SKIN EVERY 14 DAYS.   rOPINIRole (REQUIP) 3 MG tablet TAKE ONE TABLET BY MOUTH EVERY MORNING, ,NOON AND AT BEDTIME. (PRT PT MORNING,EVENING,BEDTIME)   torsemide (DEMADEX) 20 MG tablet Take 1 tablet (20 mg total) by mouth daily as needed.   traMADol (ULTRAM) 50 MG tablet Take 100 mg by mouth 4 (four) times daily as needed.    venlafaxine XR (EFFEXOR-XR) 150 MG 24 hr capsule Take 2 capsules (300 mg total) by mouth at bedtime.        Arther Abbott, MD  10/23/2020 2:29 PM

## 2020-10-23 NOTE — Patient Instructions (Signed)
Start Mobic 7.5 mg daily  Take your Protonix daily  Monitor for any GI symptoms if you have increased GI symptoms of reflux call the office  Follow-up 30 days

## 2020-10-24 ENCOUNTER — Ambulatory Visit: Payer: Self-pay | Admitting: Adult Health

## 2020-10-24 ENCOUNTER — Telehealth: Payer: Self-pay | Admitting: Neurology

## 2020-10-24 NOTE — Telephone Encounter (Signed)
Called the patient back.  In reviewing the chart, it looks like the patient was never set up with a new machine. Her last machine was a LUNA machine that was set up 2019 through [facility]. I scheduled the pt for a follow up visit on Monday with Dr Dohmeier at 1 pm. Informed the patient to bring her machine and power cord to the visit. Pt verbalized understanding.

## 2020-10-24 NOTE — Telephone Encounter (Signed)
Pt called, I am seeing Dr. Jannifer Franklin on 01/11/21, want to know if I would be able to have my initial CPAP on that day. Would that put me out of my 31-90 day for my initial CPAP. Would like a call from the nurse.

## 2020-10-24 NOTE — Telephone Encounter (Signed)
Pt cancelled appt due to transportation issues. 

## 2020-10-26 ENCOUNTER — Telehealth (HOSPITAL_COMMUNITY): Payer: Medicare Other | Admitting: Psychiatry

## 2020-10-27 ENCOUNTER — Telehealth: Payer: Self-pay | Admitting: *Deleted

## 2020-10-27 NOTE — Telephone Encounter (Signed)
Laurine Blazer, LPN  1/85/5015  8:68 PM EDT Back to Top     Notified, copy to pcp.

## 2020-10-27 NOTE — Telephone Encounter (Signed)
-----   Message from Verta Ellen., NP sent at 10/20/2020  8:20 AM EDT ----- Please call the patient and let her know the echocardiogram shows the pumping function of her heart is unchanged from previous study back in October 2019.  She has a very slightly leaking mitral valve.  She no longer has the thickness and stiffness in the main pumping chamber like she did in the previous study.  Thank you  Verta Ellen, NP  10/20/2020 8:17 AM

## 2020-10-30 ENCOUNTER — Encounter: Payer: Self-pay | Admitting: Neurology

## 2020-10-30 ENCOUNTER — Other Ambulatory Visit: Payer: Self-pay

## 2020-10-30 ENCOUNTER — Ambulatory Visit (INDEPENDENT_AMBULATORY_CARE_PROVIDER_SITE_OTHER): Payer: Medicare Other | Admitting: Neurology

## 2020-10-30 VITALS — BP 106/62 | HR 85

## 2020-10-30 DIAGNOSIS — R0902 Hypoxemia: Secondary | ICD-10-CM | POA: Diagnosis not present

## 2020-10-30 DIAGNOSIS — Z6841 Body Mass Index (BMI) 40.0 and over, adult: Secondary | ICD-10-CM

## 2020-10-30 DIAGNOSIS — Z9989 Dependence on other enabling machines and devices: Secondary | ICD-10-CM

## 2020-10-30 DIAGNOSIS — G4711 Idiopathic hypersomnia with long sleep time: Secondary | ICD-10-CM

## 2020-10-30 DIAGNOSIS — G4734 Idiopathic sleep related nonobstructive alveolar hypoventilation: Secondary | ICD-10-CM

## 2020-10-30 DIAGNOSIS — G4733 Obstructive sleep apnea (adult) (pediatric): Secondary | ICD-10-CM

## 2020-10-30 DIAGNOSIS — I255 Ischemic cardiomyopathy: Secondary | ICD-10-CM | POA: Diagnosis not present

## 2020-10-30 DIAGNOSIS — E662 Morbid (severe) obesity with alveolar hypoventilation: Secondary | ICD-10-CM

## 2020-10-30 DIAGNOSIS — I447 Left bundle-branch block, unspecified: Secondary | ICD-10-CM

## 2020-10-30 NOTE — Progress Notes (Signed)
SLEEP MEDICINE CLINIC    Provider:  Larey Seat, MD  Primary Care Physician:  Celene Squibb, MD [place] [address]     Referring Provider: DR Jannifer Franklin, MD and Butler Denmark, NP          Chief Complaint according to patient   Patient presents with:     New Patient (Initial Visit)     I know I used to snore- and have OSA- pt rm 10 alone, pt had a PSG > 12 yrs ago. she is no longer using CPAP, having  d/c over 1 year ago-- because supplier having difficulty getting necc. supplies.       HISTORY OF PRESENT ILLNESS:  NYAISHA [name] is a 70 year- old Caucasian female patient and is seen here in a RVon 10/30/2020 from Dr. [name]. Active patient of Dr Jannifer Franklin.   The patient was fitted with a Small Vitera full face mask and given 2 liters of Oxygen under 14 cm water pressure of CPAP.   DIAGNOSIS 1.        Obstructive Sleep Apnea responded to CPAP at 14 cm water but required additional oxygen at 2 liters due to continued hypoxia. 2.        No Central Sleep Apnea was seen. 3.        Sleep Related Hypoxemia was corrected under CPAP and 2 liters of 02. 4.        Non-specific abnormal EKG   PLANS/RECOMMENDATIONS: 1.        The patient will use an autotitration CPAP device at 6-16 cm water pressure with 2 cm Water EPR and Vitera small FFM. Added oxygen will be ordered at 2 liters. 2.        CPAP therapy complicate is defined as 4 hours or more of nightly use. 3.        Any apnea patient should avoid sedatives, hypnotics, and alcohol consumption at bedtime.     DISCUSSION: we need to see [name] in a 68 day revisit with download of data and additional ONO, also need Epworth score which hopefully will reflect improved conditions.             Chief concern according to patient : Mrs. Norma [name], who prefers to go by the name Norma [name], reports that she had used her CPAP compliantly for several years when her dog was chewing on her CPAP tubing-    This visit is dedicated  to sleep medicine only. As seen above the patient still had apnea but she mainly suffers from hypoxemia at night she responded to 14 cmH2O for pressure on CPAP but she required required an additional 2 L of oxygen to help with the hypoxemia.  To help she did have some extra PVCs and a slightly abnormal EKG she was fitted to a be Tara small fullface mask this model is provided by [name].  I am here today to see her compliance compliance data and it shows that the patient continues to use her lunar CPAP machine which has now switched from the DME in [place] to [facility].  For the last 30 days she has only used 16 out of 30 days she says she use it every day but she did not have a splint card to document still her machine is not based on a older model at time is made CPAP device and it seems that it still requires a memory  chip.  Her average daily use at time would be average between days of use and known use and would have been 4 hours 24 minutes.  Her average pressure for the 95th percentile was 9.5 cm water and she had only 1 AHI per hour on average.  She does have some high leakage she feels that her mask is providing good care and good fit and she is happy with that.  So if I consider her download to be extrapolated and for the whole month she would have had a compliance of over 90%.  And I am happy with her average user time but would went then be over 8 hours.  She continues to nap in the daytime and she states that actually her average nocturnal sleep time is probably between 4 and 5 hours.  She endorsed still the Epworth Sleepiness Scale at 15 points today, we do not have any hypoxemia data available she has been set up with 2 L of oxygen at home she has no morning headaches and the headaches waking her from sleep sometimes she has sinus congestion.  Based on these data we can leave the machine with the current settings she likes her current mask and I just to make sure that she  has an ST card available every year.  I would not need to change any of her settings but I would like a confirmatory overnight pulse oximetry while on CPAP with oxygen to make sure that she is truly treated for hypoxia. She will have the ONO send to her home and she will not need to come to [employer].    I have the pleasure of seeing Norma [name] on [phone], a right handed White or Caucasian female with a possible sleep disorder.  She has been treated with ECT fro Major and manic depression,    has a past medical history of Anemia, Anxiety, Bipolar disorder ([facility]), Bulging lumbar disc, Chronic daily headache, Chronic low back pain (09/20/2014), COPD (chronic obstructive pulmonary disease) ([place]), Degenerative arthritis, Depression, Diabetes mellitus, type II ([place]), Diabetic neuropathy ([facility]) (09/16/2018), DM type 2 with diabetic peripheral neuropathy ([place]) (09/20/2014), Dyslipidemia, Dyspnea, Gastroparesis, GERD (gastroesophageal reflux disease), Heart murmur, History of hiatal hernia, Hypothyroidism, IBS (irritable bowel syndrome), Memory difficulty (09/20/2014), Morbid obesity ([place]), Neuropathy, Obstructive sleep apnea on CPAP, Restless legs syndrome (RLS) (09/17/2012), Stroke (cerebrum) ([facility]) (05/16/2017), and Urticaria..   The patient had the first sleep study possibly in the year 2000-2003  with a result of OSA being diagnosed, and started CPAP- her last machine was the third CPAP she owned.   Sleep relevant medical history:  RLS, Insomnia, OSA not treated currently. Trazodone  started by Dr.Ross, psychiatry.     Family medical /sleep history: NO other family member with OSA,  Father (89) with insomnia,.    Social history:  Patient is retired from  Therapist, sports at [employer]- and lives in a household alone.  Family status is single , no children.  Pets are present. Cats and one dog.  Tobacco use- remote in the 1970s- for a year or so. [name]  ETOH use : none ,  Caffeine intake in form of Coffee(  none) Soda(none ) Tea ( iced- 2 glasses) no energy drinks. No Regular exercise - " too much pain in knees and back" .    Sleep habits are as follows: The patient's TV dinner time is between 6-11 PM. Reports RLS.  The patient goes to bed at variable -  cyclic insomnia, days with insomnia and alternating with hypersomnia.     The preferred sleep position is on her sides, with the support of 2 pillows. Dreams are reportedly rare-.  No established usual rise time. If her dog lets her sleep, she will sleep until noon. The patient wakes up spontaneously.  She reports not feeling refreshed or restored in AM, with symptoms such as dry mouth  and residual fatigue.  Naps are taken frequently, afternoons lasting from 1-2 hours and are more/ refreshing than nocturnal sleep.    Review of Systems: Out of a complete 14 system review, the patient complains of only the following symptoms, and all other reviewed systems are negative.:  Fatigue, sleepiness , RLS< snoring, nocturia and otherwise fragmented sleep, cyclic and chronic Insomnia - addressed by psychiatric care    How likely are you to doze in the following situations: 0 = not likely, 1 = slight chance, 2 = moderate chance, 3 = high chance   Sitting and Reading? Watching Television? Sitting inactive in a public place (theater or meeting)? As a passenger in a car for an hour without a break? Lying down in the afternoon when circumstances permit? Sitting and talking to someone? Sitting quietly after lunch without alcohol? In a car, while stopped for a few minutes in traffic?   Total = 18/ 24 points   FSS endorsed at 57/ 63 points.  GDS endorsed at 8 out of 15, indicative of depression being present now.   Social History   Socioeconomic History   Marital status: Single    Spouse name: Not on file   Number of children: 0   Years of education: 16   Highest education level: Not on file  Occupational History    Employer: [employer]   Tobacco Use   Smoking status: Former    Types: Cigarettes    Quit date: 02/04/1971    Years since quitting: 49.7   Smokeless tobacco: Never   Tobacco comments:    smoked 2 cigarettes a day  Vaping Use   Vaping Use: Never used  Substance and Sexual Activity   Alcohol use: No    Alcohol/week: 0.0 standard drinks   Drug use: No   Sexual activity: Never  Other Topics Concern   Not on file  Social History Narrative   Patient lives at home alone.    Patient has no children.    Patient has her masters in nursing.    Patient is single.    Patient drinks about 2 glasses of tea daily.   Patient is right handed.   Social Determinants of Health   Financial Resource Strain: Not on file  Food Insecurity: Not on file  Transportation Needs: Not on file  Physical Activity: Not on file  Stress: Not on file  Social Connections: Not on file    Family History  Problem Relation Age of Onset   Hypertension Mother    Lymphoma Mother    Depression Mother    Arthritis Father    Alcohol abuse Father    Hypertension Sister    Cancer Brother        kidney and lung   Alcohol abuse Brother    Alcohol abuse Paternal Uncle    Alcohol abuse Paternal Grandfather    Alcohol abuse Paternal Grandmother    Allergic rhinitis Neg Hx    Angioedema Neg Hx    Asthma Neg Hx    Atopy Neg Hx    Eczema Neg Hx  Immunodeficiency Neg Hx    Urticaria Neg Hx     Past Medical History:  Diagnosis Date   Anemia    Anxiety    Bipolar disorder ([facility])    Bulging lumbar disc    L3-4       Chronic low back pain 09/20/2014   COPD (chronic obstructive pulmonary disease) ([facility])    Degenerative arthritis             09/16/2018   DM type 2 with diabetic peripheral neuropathy  2003 ([facility]) 09/20/2014   Dyslipidemia    Dyspnea on exertion.     Gastroparesis in diabetes.     GERD (gastroesophageal reflux disease)    Heart murmur    History of hiatal hernia    Hypothyroidism    IBS (irritable bowel syndrome)     Memory difficulty 09/20/2014   Morbid obesity ([place]) BMI 45         Obstructive sleep apnea on CPAP    Restless legs syndrome (RLS) 09/17/2012   Stroke (cerebrum) ([facility]) 05/16/2017   Left parietal   Urticaria     Past Surgical History:  Procedure Laterality Date   ABDOMINAL HYSTERECTOMY     ARTHROSCOPY KNEE W/ DRILLING  06/2011   and Decemer of 2013.   Carpel tunnel  1980's   CATARACT EXTRACTION W/PHACO Right 03/21/2017   Procedure: CATARACT EXTRACTION PHACO AND INTRAOCULAR LENS PLACEMENT RIGHT EYE;  Surgeon: Baruch Goldmann, MD;  Location: [facility];  Service: Ophthalmology;  Laterality: Right;  CDE: 2.91    CATARACT EXTRACTION W/PHACO Left 04/18/2017   Procedure: CATARACT EXTRACTION PHACO AND INTRAOCULAR LENS PLACEMENT LEFT EYE;  Surgeon: Baruch Goldmann, MD;  Location: [facility];  Service: Ophthalmology;  Laterality: Left;  left   CHOLECYSTECTOMY     COLONOSCOPY WITH PROPOFOL N/A 10/04/2016   Procedure: COLONOSCOPY WITH PROPOFOL;  Surgeon: Rogene Houston, MD;  [facility];  Service: Endoscopy;  Laterality: N/A;  11:10   CORONARY ARTERY BYPASS GRAFT N/A 08/29/2017   Procedure: CORONARY ARTERY BYPASS GRAFTING (CABG) x 3 WITH ENDOSCOPIC HARVESTING OF RIGHT SAPHENOUS VEIN;  Surgeon: Ivin Poot, MD;  Location: [facility];  Service: Open Heart Surgery;  Laterality: N/A;   ESOPHAGOGASTRODUODENOSCOPY N/A 04/25/2016   Procedure: ESOPHAGOGASTRODUODENOSCOPY (EGD);  Surgeon: Rogene Houston, MD;  [facility];  Service: Endoscopy;  Laterality: N/A;  730   LEFT HEART CATH AND CORONARY ANGIOGRAPHY N/A 08/27/2017   Procedure: LEFT HEART CATH AND CORONARY ANGIOGRAPHY;  Surgeon: Sherren Mocha, MD;  Location: [facility];  Service: Cardiovascular::  pLAD 95% - p-mLAD 50%, ostD1 90% -pD1 80%; ostOM1 90%; rPDA 80%. EF ~50-55% - HK of dital Anterolateral & Apical wall.  - Rec CVTS c/s   OPEN REDUCTION INTERNAL FIXATION (ORIF) HAND Left 04/10/2020   Procedure: OPEN REDUCTION INTERNAL FIXATION  (ORIF) HAND, left index finger;  Surgeon: Carole Civil, MD;  Location: [facility];  Service: Orthopedics;  Laterality: Left;  0.045 k wires   RECTAL SURGERY     fissure   SHOULDER SURGERY Left    arthroscopy in March of this year   TEE WITHOUT CARDIOVERSION N/A 08/29/2017   Procedure: TRANSESOPHAGEAL ECHOCARDIOGRAM (TEE);  Surgeon: Prescott Gum, Collier Salina, MD;  Location: [facility];  Service: Open Heart Surgery;  Laterality: N/A;   TRANSTHORACIC ECHOCARDIOGRAM  06/06/2017   Mild to moderate reduced EF 40 and 45%.  Anterior septal, inferoseptal and basal to mid inferior hypokinesis.  GR 1 DD.  No significant valvular  lesion     Current Outpatient Medications on File Prior to Visit  Medication Sig Dispense Refill   ferrous sulfate 325 (65 FE) MG tablet Take 325 mg by mouth daily after supper.     ALPRAZolam (XANAX) 0.25 MG tablet Take 1 tablet (0.25 mg total) by mouth 2 (two) times daily as needed for anxiety. 60 tablet 2   aspirin EC 81 MG tablet Take 1 tablet (81 mg total) by mouth daily. 90 tablet 3   baclofen (LIORESAL) 10 MG tablet Take 1 tablet (10 mg total) by mouth at bedtime. 30 tablet 6   buPROPion (WELLBUTRIN XL) 150 MG 24 hr tablet Take 1 tablet (150 mg total) by mouth every morning. 30 tablet 2   calcium carbonate (TUMS - DOSED IN MG ELEMENTAL CALCIUM) 500 MG chewable tablet Chew 2 tablets by mouth as needed for indigestion or heartburn.      carvedilol (COREG) 6.25 MG tablet TAKE ONE TABLET BY MOUTH TWICE DAILY (MORNING ,EVENING) 180 tablet 2   cetirizine (ZYRTEC) 10 MG tablet Take 1 tablet tablet 1-2 times daily. (Patient taking differently: Take by mouth at bedtime.) 60 tablet 5   diclofenac Sodium (VOLTAREN) 1 % GEL Apply topically 4 (four) times daily.     diphenhydrAMINE (BENADRYL) 25 mg capsule Take 25 mg by mouth as needed.     EPINEPHrine 0.3 mg/0.3 mL IJ SOAJ injection Inject 0.3 mg into the muscle once.     gabapentin (NEURONTIN) 400 MG capsule Take 1 capsule in the morning, take  2 midday, take 1 at night 360 capsule 1   glipiZIDE (GLUCOTROL XL) 5 MG 24 hr tablet TAKE ONE TABLET BY MOUTH DAILY WITH BREAKFAST 90 tablet 0   lamoTRIgine (LAMICTAL) 150 MG tablet Take 1 tablet (150 mg total) by mouth at bedtime. 28 tablet 2   levothyroxine (SYNTHROID, LEVOTHROID) 100 MCG tablet Take 100 mcg by mouth daily before breakfast.     losartan (COZAAR) 25 MG tablet TAKE ONE TABLET BY MOUTH DAILY (MORNING) 90 tablet 2   meclizine (ANTIVERT) 25 MG tablet Take 1 tablet (25 mg total) by mouth 3 (three) times daily as needed for dizziness. (Patient taking differently: Take 25 mg by mouth daily as needed for dizziness.) 30 tablet 0   meloxicam (MOBIC) 7.5 MG tablet Take 1 tablet (7.5 mg total) by mouth daily. 30 tablet 5   metFORMIN (GLUCOPHAGE) 500 MG tablet TAKE ONE TABLET BY MOUTH TWICE DAILY WITH A MEAL (MORNING ,EVENING) 180 tablet 0   pantoprazole (PROTONIX) 40 MG tablet Take 1 tablet (40 mg total) by mouth every evening. 90 tablet 3   REPATHA SURECLICK 321 MG/ML SOAJ INJECT ONE DOSE INTO SKIN EVERY 14 DAYS. 2 mL 11   rOPINIRole (REQUIP) 3 MG tablet TAKE ONE TABLET BY MOUTH EVERY MORNING, ,NOON AND AT BEDTIME. (PRT PT MORNING,EVENING,BEDTIME) 270 tablet 1   torsemide (DEMADEX) 20 MG tablet Take 1 tablet (20 mg total) by mouth daily as needed. 90 tablet 1   traMADol (ULTRAM) 50 MG tablet Take 100 mg by mouth 4 (four) times daily as needed.      venlafaxine XR (EFFEXOR-XR) 150 MG 24 hr capsule Take 2 capsules (300 mg total) by mouth at bedtime. 60 capsule 2   No current facility-administered medications on file prior to visit.    Allergies  Allergen Reactions   Amoxicillin Anaphylaxis and Other (See Comments)    Has patient had a PCN reaction causing immediate rash, facial/tongue/throat swelling, SOB or lightheadedness with hypotension:  Yes Has patient had a PCN reaction causing severe rash involving mucus membranes or skin necrosis: Yes Has patient had a PCN reaction that required  hospitalization: Yes Has patient had a PCN reaction occurring within the last 10 years: Yes If all of the above answers are "NO", then may proceed with Cephalosporin use.    Hydrocodone Anaphylaxis   Percocet [Oxycodone-Acetaminophen] Other (See Comments)    Causes chest pain /tightness. Pressure pain around her ribs.   Vancomycin Itching   Depacon [Valproic Acid] Other (See Comments)    Causes falls     Physical exam:  Today's Vitals   10/30/20 1308  BP: 106/62  Pulse: 85   There is no height or weight on file to calculate BMI.   Wt Readings from Last 3 Encounters:  10/23/20 250 lb (113.4 kg)  09/12/20 250 lb (113.4 kg)  07/10/20 249 lb (112.9 kg)     Ht Readings from Last 3 Encounters:  10/23/20 5\' 1"  (1.549 m)  09/12/20 5' (1.524 m)  07/10/20 5' (1.524 m)      General: The patient is awake, alert and appears not in acute distress. The patient is well groomed. She is restless, akathesia? Orofacial dyskinesia.  Head: Normocephalic, atraumatic. Neck is supple. Mallampati 3 plus, reddened soft tissue.,  neck circumference:15.5  inches . Nasal airflow patent, but congested at night.   Retrognathia is  seen.  Dental status: poor.  Cardiovascular:  Regular rate and cardiac rhythm by pulse,  without distended neck veins. Respiratory: Lungs are clear to auscultation.  Skin:  With evidence of ankle edema, and antibrachial scarring- cutting ? Rash?. Trunk: The patient's posture is slouched.    Neurologic exam : The patient is awake and alert, oriented to place and time.   Memory subjective described as impaired since ECT- expected side effect.   Attention span & concentration ability appears normal.  Speech is slowed and slurred. No aphasia.  Mood and affect are appropriate.   Cranial nerves: no loss of smell or taste reported  Pupils are equal and briskly reactive to light. Funduscopic exam deferred. .  Extraocular movements in vertical and horizontal planes were intact  and without nystagmus. No Diplopia. Visual fields by finger perimetry are intact. Hearing was intact to soft voice and finger rubbing. Facial sensation intact to fine touch. Facial motor strength is symmetric and tongue and uvula move midline.  Neck ROM : rotation, tilt and flexion extension were normal for age and shoulder shrug was symmetrical.    Motor exam:  Symmetric bulk, tone and ROM.   Normal tone without cog wheeling, symmetric grip strength . Hip flexion weakness and adductor weakness noted.    Sensory:  Fine touch and vibration were  normal.  Proprioception tested in the upper extremities was normal.   Coordination: Rapid alternating movements in the fingers/hands were of normal speed.  The Finger-to-nose maneuver was intact without evidence of ataxia, dysmetria or tremor.   Gait and station: Patient could rise by bracing herself  from a seated position, walked without assistive device.  She refuses to use walker or cane, she reported.  Stance is of widerbase and the patient turned with 5 steps.  Toe and heel walk were deferred.  Deep tendon reflexes: in the upper and lower extremities are symmetric and intact.  Babinski response was deferred.        After spending a total time of  50  minutes face to face and additional time for physical and neurologic examination,  review of laboratory studies,  personal review of imaging studies, reports and results of other testing and review of referral information / records as far as provided in visit, I have established the following assessments:  The patient has been followed by Dr. Lanetta Inch for decades who also prescribed her CPAP machines, she reported.   With his retirement she is needing to transfer CPAP care.   Dr. Salomon Mast, her new primary care physician, saw the patient on 02 August 2019 and arranged for this transfer.  Now she falls very easily asleep when not physically challenged or not mentally stimulated and she is fatigued  during the day.  Should her history of bipolar disorder complicates the issue as" often causes of chronic and cyclic insomnia alternating with hypersomnia.  Her risk factors are unchanged also she has never been a smoker she demonstrates a BMI of 50 of 46, and reported to have restless legs.  She has been followed by neurologist Dr. Jannifer Franklin according to Dr. Juel Burrow notes and takes Requip 3 times a day.  Her psychiatrist has prescribed temazepam, Effexor, Wellbutrin.  She carries a diagnosis of cyclic insomnia secondary to mental illness, restless leg syndrome with possible akathisia component, chronic systolic congestive heart failure, chronic pancreatitis, daytime hypersomnia.  CPAP was just ordered about 3 years ago this does not consistent with the patient's report I think should she has not had a new CPAP for over 5 years.   My Plan is to proceed with: CPAP and oxygen at 2 liters, LUNA CPAP with SD card, not with a working modem.   1) I would not need to change any of her settings but I would like a confirmatory overnight pulse oximetry while on CPAP with oxygen to make sure that she is truly treated for hypoxia. She will have the ONO send to her home and she will not need to come to [place].     I would like to thank Dr [name] and Dr. Jannifer Franklin for allowing me to meet with and to take care of this pleasant patient.   In short, ILHAN DEBENEDETTO is presenting with insomnia due to mental illness, alternating cyclic insomnia and hypersomnia, lack of sleep routines or sleep hygiene.  RLS may well be a factor, she is diabetic- DM 2 , and has well documented DM neuropathy and sciatica - followed by Dr. Jannifer Franklin.  CPAP use with oxygen has not decreased her sleepiness. I would not need to change any of her settings  ( residual AHI was 1/h)  but I would like a confirmatory overnight pulse oximetry while on CPAP with oxygen to make sure that she is truly treated for hypoxia. She will have the ONO send to her home and  she will not need to come to [place].   Rv in 12 month with NP Slack. ONO will be resulted in a phone note to patient. If abnormal will ned to advance RV.      Electronically signed by: Larey Seat, MD 10/30/2020 1:17 PM  [facility] Board certified by The [employer] of Sleep Medicine and Diplomate of the [employer] of Sleep Medicine. Board certified In Neurology through the [place], Fellow of the [employer] of Neurology. Medical Director of [facility].

## 2020-11-03 DIAGNOSIS — I10 Essential (primary) hypertension: Secondary | ICD-10-CM | POA: Diagnosis not present

## 2020-11-03 DIAGNOSIS — E119 Type 2 diabetes mellitus without complications: Secondary | ICD-10-CM | POA: Diagnosis not present

## 2020-11-06 NOTE — Progress Notes (Signed)
Virtual Visit via Telephone Note   This visit type was conducted due to national recommendations for restrictions regarding the COVID-19 Pandemic (e.g. social distancing) in an effort to limit this patient's exposure and mitigate transmission in our community.  Due to her co-morbid illnesses, this patient is at least at moderate risk for complications without adequate follow up.  This format is felt to be most appropriate for this patient at this time.  The patient did not have access to video technology/had technical difficulties with video requiring transitioning to audio format only (telephone).  All issues noted in this document were discussed and addressed.  No physical exam could be performed with this format.  Please refer to the patient's chart for her  consent to telehealth for [facility].    Date:  11/07/2020   ID:  Norma [name], DOB [birth year], MRN [id number] The patient was identified using 2 identifiers.  Patient Location: Home Provider Location: Office/Clinic   PCP:  Celene Squibb, MD   [facility] Providers Cardiologist:  Carlyle Dolly, MD     Evaluation Performed:  Follow-Up Visit  Chief Complaint: 1 month follow-up  History of Present Illness:    Norma [name] is a 71 y.o. female with a history of unstable angina, CVA, LBBB, HTN, DM2, cardiomyopathy, OSA, morbid obesity, GERD, HLD, acute on chronic diastolic heart failure.   She was last seen by Dr. Harl Bowie on 07/07/2020.  She was taking torsemide as needed.  Having increased edema. Was having DOE from parking lot to the office.  She  stopped Lopressor due to fatigue.  She was continuing [place] with LDL at 43.  She was continued on CPAP followed by pulmonology.  Plan was to work on taking her as needed torsemide more consistently over the next few days and follow volume status.  Beta-blocker has been stopped due to fatigue.  She was experiencing no anginal symptoms and continuing current meds. EKG NSR and  chronic LBBB.  He was last here for 1 month follow-up.  She stated she felt generally lousy that day.  She felt fatigued with low energy.  She was having some balance issues.  She stated approximately 1 month prior to visit her gabapentin dose was increased which seemed to coincide with her increased feeling of fatigue and decreased energy along with balance issues.  She denied any current DOE or SOB.  Her weight was 250.  Her most recent weight was 244 at home.  She stated she had been taking the torsemide daily without any real change in weight.  Previous echocardiogram demonstrated grade 1 diastolic dysfunction.  At last visit with Dr. Harl Bowie she was having DOE from parking lot to the office.  She was requesting increase in the dosage of torsemide.  Spoke to patient by phone via virtual visit today for 1 month follow-up.  She states the current torsemide dose does not seem to be very effective.  She denies any current DOE or shortness of breath.  She has no current weight today.  Denies any fatigue.  No anginal or exertional symptoms, orthostatic symptoms, CVA or TIA-like symptoms, palpitations or arrhythmias.  No PND orthopnea.  We discussed the recent echocardiogram results.  See results below.  No evidence of diastolic dysfunction.  EF 50 to 55%, trivial MR. Echo showed abnormal septal motion secondary to left bundle branch block.  She verbalized understanding.    The patient does not have symptoms concerning for COVID-19 infection (fever, chills, cough, or new  shortness of breath).    Past Medical History:  Diagnosis Date   Anemia    Anxiety    Bipolar disorder ([place])    Bulging lumbar disc    L3-4   Chronic daily headache    Chronic low back pain 09/20/2014   COPD (chronic obstructive pulmonary disease) (HCC)    Degenerative arthritis    Depression    Diabetes mellitus, type II ([place])    Diabetic neuropathy ([place]) 09/16/2018   DM type 2 with diabetic peripheral neuropathy ([place]) 09/20/2014    Dyslipidemia    Dyspnea    Gastroparesis    GERD (gastroesophageal reflux disease)    Heart murmur    History of hiatal hernia    Hypothyroidism    IBS (irritable bowel syndrome)    Memory difficulty 09/20/2014   Morbid obesity ([place])    Neuropathy    Obstructive sleep apnea on CPAP    Restless legs syndrome (RLS) 09/17/2012   Stroke (cerebrum) ([place]) 05/16/2017   Left parietal   Urticaria    Past Surgical History:  Procedure Laterality Date   ABDOMINAL HYSTERECTOMY     ARTHROSCOPY KNEE W/ DRILLING  06/2011   and Dece[place] of 2013.   Carpel tunnel  1980's   CATARACT EXTRACTION W/PHACO Right 03/21/2017   Procedure: CATARACT EXTRACTION PHACO AND INTRAOCULAR LENS PLACEMENT RIGHT EYE;  Surgeon: Baruch Goldmann, MD;  Location: [facility];  Service: Ophthalmology;  Laterality: Right;  CDE: 2.91    CATARACT EXTRACTION W/PHACO Left 04/18/2017   Procedure: CATARACT EXTRACTION PHACO AND INTRAOCULAR LENS PLACEMENT LEFT EYE;  Surgeon: Baruch Goldmann, MD;  Location: [facility];  Service: Ophthalmology;  Laterality: Left;  left   CHOLECYSTECTOMY     COLONOSCOPY WITH PROPOFOL N/A 10/04/2016   Procedure: COLONOSCOPY WITH PROPOFOL;  Surgeon: Rogene Houston, MD;  Location: [facility];  Service: Endoscopy;  Laterality: N/A;  11:10   CORONARY ARTERY BYPASS GRAFT N/A 08/29/2017   Procedure: CORONARY ARTERY BYPASS GRAFTING (CABG) x 3 WITH ENDOSCOPIC HARVESTING OF RIGHT SAPHENOUS VEIN;  Surgeon: Ivin Poot, MD;  Location: [place];  Service: Open Heart Surgery;  Laterality: N/A;   ESOPHAGOGASTRODUODENOSCOPY N/A 04/25/2016   Procedure: ESOPHAGOGASTRODUODENOSCOPY (EGD);  Surgeon: Rogene Houston, MD;  [facility];  Service: Endoscopy;  Laterality: N/A;  730   LEFT HEART CATH AND CORONARY ANGIOGRAPHY N/A 08/27/2017   Procedure: LEFT HEART CATH AND CORONARY ANGIOGRAPHY;  Surgeon: Sherren Mocha, MD;  Location: [facility];  Service: Cardiovascular::  pLAD 95% - p-mLAD 50%, ostD1 90% -pD1 80%; ostOM1 90%;  rPDA 80%. EF ~50-55% - HK of dital Anterolateral & Apical wall.  - Rec CVTS c/s   OPEN REDUCTION INTERNAL FIXATION (ORIF) HAND Left 04/10/2020   Procedure: OPEN REDUCTION INTERNAL FIXATION (ORIF) HAND, left index finger;  Surgeon: Carole Civil, MD;  Location: [facility];  Service: Orthopedics;  Laterality: Left;  0.045 k wires   RECTAL SURGERY     fissure   SHOULDER SURGERY Left    arthroscopy in March of this year   TEE WITHOUT CARDIOVERSION N/A 08/29/2017   Procedure: TRANSESOPHAGEAL ECHOCARDIOGRAM (TEE);  Surgeon: Prescott Gum, Collier Salina, MD;  Location: [place];  Service: Open Heart Surgery;  Laterality: N/A;   TRANSTHORACIC ECHOCARDIOGRAM  06/06/2017   Mild to moderate reduced EF 40 and 45%.  Anterior septal, inferoseptal and basal to mid inferior hypokinesis.  GR 1 DD.  No significant valvular lesion     No outpatient medications have been marked  as taking for the 11/07/20 encounter (Telemedicine) with Verta Ellen., NP.     Allergies:   Amoxicillin, Hydrocodone, Percocet [oxycodone-acetaminophen], Vancomycin, Depacon [valproic acid], and Depakote [divalproex sodium]   Social History   Tobacco Use   Smoking status: Former    Types: Cigarettes    Quit date: 02/04/1971    Years since quitting: 49.7   Smokeless tobacco: Never   Tobacco comments:    smoked 2 cigarettes a day  Vaping Use   Vaping Use: Never used  Substance Use Topics   Alcohol use: No    Alcohol/week: 0.0 standard drinks   Drug use: No     Family Hx: The patient's family history includes Alcohol abuse in her brother, father, paternal grandfather, paternal grandmother, and paternal uncle; Arthritis in her father; Cancer in her brother; Depression in her mother; Hypertension in her mother and sister; Lymphoma in her mother. There is no history of Allergic rhinitis, Angioedema, Asthma, Atopy, Eczema, Immunodeficiency, or Urticaria.  ROS:   Please see the history of present illness.    All other systems reviewed  and are negative.   Prior CV studies:   The following studies were reviewed today:   Echocardiogram 10/18/2020  1. Markedly abnormal septal motion ? from BBB. Left ventricular ejection fraction, by estimation, is 50 to 55%. The left ventricle has low normal function. The left ventricle has no regional wall motion abnormalities. Left ventricular diastolic parameters were normal. 2. Right ventricular systolic function is normal. The right ventricular size is normal. 3. The mitral valve is normal in structure. Trivial mitral valve regurgitation. No evidence of mitral stenosis. 4. The aortic valve is tricuspid. There is mild calcification of the aortic valve. Aortic valve regurgitation is not visualized. Mild aortic valve sclerosis is present, with no evidence of aortic valve stenosis. 5. The inferior vena cava is normal in size with greater than 50% respiratory variability, suggesting right atrial pressure of 3 mmHg. Comparison(s): Echocardiogram done 02/03/18 showed an EF of 50-55%.     03/2016 Lexiscan Probable normal perfusion and mild soft tissue attenuation (breast) No significant ischemia or evidence for scar The left ventricular ejection fraction is normal (55-65%). Low risk scan.   08/2017 cath RPDA lesion is 80% stenosed. Ost 1st Mrg to 1st Mrg lesion is 90% stenosed. Prox LAD lesion is 95% stenosed. Prox LAD to Mid LAD lesion is 50% stenosed. Ost 1st Diag lesion is 90% stenosed. 1st Diag lesion is 80% stenosed. The left ventricular ejection fraction is 50-55% by visual estimate. There is mild left ventricular systolic dysfunction.   1.  Severe multivessel coronary artery disease with severe stenosis of the proximal LAD/first diagonal bifurcation, first obtuse marginal branch of the circumflex, and PDA branch of the RCA. 2.  Mild segmental LV systolic dysfunction with hypokinesis of the distal anterolateral and apical walls, LVEF estimated at 50 to 55%.   08/2017  TEE Aortic valve: The valve is trileaflet. No stenosis. No regurgitation. Mitral valve: Mild regurgitation. Central jet. Right ventricle: Normal cavity size, wall thickness and ejection fraction. Pericardium: Trivial pericardial effusion. Tricuspid valve: No regurgitation Pulmonic valve: No regurgitation by color doppler. Left ventricle: Regional wall motion abnormalities present, dyskinetic basal and apical septal wall, hypokinesis of lateral wall and inferoseptal wall. Increased wall thickness. LVEF 35-40%. No ASD/PFO present Unable to rule out thrombus in LAA but velocities make thrombus less likely.   05/2017 echo Study Conclusions   - Left ventricle: The cavity size was normal. There was mild  concentric hypertrophy. Systolic function was mildly to   moderately reduced. The estimated ejection fraction was in the   range of 40% to 45%. Hypokinesis of the anteroseptal,   inferoseptal and basal to mid inferior myocardium. Doppler   parameters are consistent with abnormal left ventricular   relaxation (grade 1 diastolic dysfunction). Doppler parameters   are consistent with indeterminate ventricular filling pressure. - Aortic valve: Transvalvular velocity was within the normal range.   There was no stenosis. There was no regurgitation. - Mitral valve: Transvalvular velocity was within the normal range.   There was no evidence for stenosis. There was no regurgitation. - Right ventricle: The cavity size was normal. Wall thickness was   normal. Systolic function was normal. - Tricuspid valve: There was trivial regurgitation. - Global longitudinal strain -11.9%.     01/2018 echo Study Conclusions   - Left ventricle: The cavity size was normal. Wall thickness was   increased in a pattern of mild LVH. Systolic function was normal.   The estimated ejection fraction was in the range of 50% to 55%.   Regional wall motion abnormalities cannot be excluded. Doppler   parameters are  consistent with abnormal left ventricular   relaxation (grade 1 diastolic dysfunction). - Ventricular septum: Septal motion showed abnormal function and   dyssynergy. These changes are consistent with a left bundle   branch block. - Mitral valve: Mildly thickened leaflets .    Labs/Other Tests and Data Reviewed:    EKG:    Recent Labs: 04/09/2020: ALT 14 04/11/2020: BUN 19; Creatinine, Ser 1.11; Hemoglobin 10.1; Platelets 352; Potassium 4.5; Sodium 138   Recent Lipid Panel Lab Results  Component Value Date/Time   CHOL 129 11/08/2019 12:00 AM   TRIG 158 11/08/2019 12:00 AM   HDL 60 11/08/2019 12:00 AM   CHOLHDL 3.8 03/05/2019 12:21 PM   LDLCALC 43 11/08/2019 12:00 AM   LDLCALC 152 (H) 03/05/2019 12:21 PM    Wt Readings from Last 3 Encounters:  11/07/20 246 lb (111.6 kg)  10/23/20 250 lb (113.4 kg)  09/12/20 250 lb (113.4 kg)     Risk Assessment/Calculations:          Objective:    Vital Signs:  BP 98/68   Ht 5' (1.524 m)   Wt 246 lb (111.6 kg)   BMI 48.04 kg/m    VITAL SIGNS:  reviewed GEN:  no acute distress  No dyspnea, cough, or wheezing noted during phone conversation today  ASSESSMENT & PLAN:      1. Chronic diastolic heart failure ([place])  Spoke to the patient by phone today regarding echocardiogram results.  She verbalizes understanding.  She denies any acute respiratory distress.  She states the torsemide dose does not seem to help unless she takes at least 40 mg daily.  Increase torsemide to 40 mg p.o. daily.  Continue carvedilol 6.25 mg p.o. twice daily.  Please get a BMP and magnesium.  Continue losartan 25 mg p.o. daily.    2. CAD in native artery Denies any anginal symptoms.  Continue aspirin 81 mg daily.   3. Diastolic dysfunction Recent repeat echocardiogram On 10/18/2020 showed markedly abnormal septal motion?  From BBB.  LVEF 50 to 55%.  No WMA's.  Diastolic parameters normal.  Trivial MR.  EF unchanged from prior echo.    4. Benign essential  HTN BP 98/68 today.  Continue losartan 25 mg daily.   COVID-19 Education: The signs and symptoms of COVID-19 were discussed with the patient and  how to seek care for testing (follow up with PCP or arrange E-visit).  The importance of social distancing was discussed today.  Time:   Today, I have spent 10 minutes minutes with the patient with telehealth technology discussing the above problems.     Medication Adjustments/Labs and Tests Ordered: Current medicines are reviewed at length with the patient today.  Concerns regarding medicines are outlined above.   Tests Ordered: No orders of the defined types were placed in this encounter.   Medication Changes: No orders of the defined types were placed in this encounter.   Follow Up: Dr. Harl Bowie or APP In Person in 6 month(s)  Signed, Verta Ellen, NP  11/07/2020 1:12 PM    [facility]

## 2020-11-07 ENCOUNTER — Telehealth: Payer: Self-pay | Admitting: *Deleted

## 2020-11-07 ENCOUNTER — Encounter: Payer: Self-pay | Admitting: Family Medicine

## 2020-11-07 ENCOUNTER — Telehealth (INDEPENDENT_AMBULATORY_CARE_PROVIDER_SITE_OTHER): Payer: Medicare Other | Admitting: Family Medicine

## 2020-11-07 ENCOUNTER — Other Ambulatory Visit: Payer: Self-pay

## 2020-11-07 VITALS — BP 98/68 | Ht 60.0 in | Wt 246.0 lb

## 2020-11-07 DIAGNOSIS — I10 Essential (primary) hypertension: Secondary | ICD-10-CM | POA: Diagnosis not present

## 2020-11-07 DIAGNOSIS — I5189 Other ill-defined heart diseases: Secondary | ICD-10-CM | POA: Diagnosis not present

## 2020-11-07 DIAGNOSIS — I2510 Atherosclerotic heart disease of native coronary artery without angina pectoris: Secondary | ICD-10-CM | POA: Diagnosis not present

## 2020-11-07 DIAGNOSIS — I5032 Chronic diastolic (congestive) heart failure: Secondary | ICD-10-CM

## 2020-11-07 MED ORDER — TORSEMIDE 20 MG PO TABS: 40.0000 mg | ORAL_TABLET | Freq: Every day | ORAL | 3 refills | Status: DC

## 2020-11-07 NOTE — Patient Instructions (Addendum)
Medication Instructions:  Your physician has recommended you make the following change in your medication:   Increase Torsemide to 40 mg Daily   *If you need a refill on your cardiac medications before your next appointment, please call your pharmacy*   Lab Work: Your physician recommends that you return for lab work with next lab draw.   If you have labs (blood work) drawn today and your tests are completely normal, you will receive your results only by: [place] (if you have MyChart) OR A paper copy in the mail If you have any lab test that is abnormal or we need to change your treatment, we will call you to review the results.   Testing/Procedures: NONE    Follow-Up: At [facility], you and your health needs are our priority.  As part of our continuing mission to provide you with exceptional heart care, we have created designated Provider Care Teams.  These Care Teams include your primary Cardiologist (physician) and Advanced Practice Providers (APPs -  Physician Assistants and Nurse Practitioners) who all work together to provide you with the care you need, when you need it.  We recommend signing up for the patient portal called "MyChart".  Sign up information is provided on this After Visit Summary.  MyChart is used to connect with patients for Virtual Visits (Telemedicine).  Patients are able to view lab/test results, encounter notes, upcoming appointments, etc.  Non-urgent messages can be sent to your provider as well.   To learn more about what you can do with MyChart, go to [email or web address].    Your next appointment:   6 month(s)  The format for your next appointment:   In Person  Provider:   Carlyle Dolly, MD   Other Instructions Thank you for choosing [facility]!

## 2020-11-07 NOTE — Telephone Encounter (Signed)
  Patient Consent for Virtual Visit        [name] has provided verbal consent on 11/07/2020 for a virtual visit (video or telephone).   CONSENT FOR VIRTUAL VISIT FOR:  Norma [name]  By participating in this virtual visit I agree to the following:  I hereby voluntarily request, consent and authorize [facility] and its employed or contracted physicians, physician assistants, nurse practitioners or other licensed health care professionals (the Practitioner), to provide me with telemedicine health care services (the "Services") as deemed necessary by the treating Practitioner. I acknowledge and consent to receive the Services by the Practitioner via telemedicine. I understand that the telemedicine visit will involve communicating with the Practitioner through live audiovisual communication technology and the disclosure of certain medical information by electronic transmission. I acknowledge that I have been given the opportunity to request an in-person assessment or other available alternative prior to the telemedicine visit and am voluntarily participating in the telemedicine visit.  I understand that I have the right to withhold or withdraw my consent to the use of telemedicine in the course of my care at any time, without affecting my right to future care or treatment, and that the Practitioner or I may terminate the telemedicine visit at any time. I understand that I have the right to inspect all information obtained and/or recorded in the course of the telemedicine visit and may receive copies of available information for a reasonable fee.  I understand that some of the potential risks of receiving the Services via telemedicine include:  Delay or interruption in medical evaluation due to technological equipment failure or disruption; Information transmitted may not be sufficient (e.g. poor resolution of images) to allow for appropriate medical decision making by the Practitioner;  and/or  In rare instances, security protocols could fail, causing a breach of personal health information.  Furthermore, I acknowledge that it is my responsibility to provide information about my medical history, conditions and care that is complete and accurate to the best of my ability. I acknowledge that Practitioner's advice, recommendations, and/or decision may be based on factors not within their control, such as incomplete or inaccurate data provided by me or distortions of diagnostic images or specimens that may result from electronic transmissions. I understand that the practice of medicine is not an exact science and that Practitioner makes no warranties or guarantees regarding treatment outcomes. I acknowledge that a copy of this consent can be made available to me via my patient portal ([place]), or I can request a printed copy by calling the office of [facility].    I understand that my insurance will be billed for this visit.   I have read or had this consent read to me. I understand the contents of this consent, which adequately explains the benefits and risks of the Services being provided via telemedicine.  I have been provided ample opportunity to ask questions regarding this consent and the Services and have had my questions answered to my satisfaction. I give my informed consent for the services to be provided through the use of telemedicine in my medical care

## 2020-11-14 DIAGNOSIS — I5020 Unspecified systolic (congestive) heart failure: Secondary | ICD-10-CM | POA: Insufficient documentation

## 2020-11-14 DIAGNOSIS — N289 Disorder of kidney and ureter, unspecified: Secondary | ICD-10-CM | POA: Insufficient documentation

## 2020-11-16 ENCOUNTER — Telehealth (INDEPENDENT_AMBULATORY_CARE_PROVIDER_SITE_OTHER): Payer: Medicare Other | Admitting: Psychiatry

## 2020-11-16 ENCOUNTER — Other Ambulatory Visit: Payer: Self-pay

## 2020-11-16 ENCOUNTER — Encounter (HOSPITAL_COMMUNITY): Payer: Self-pay | Admitting: Psychiatry

## 2020-11-16 DIAGNOSIS — I255 Ischemic cardiomyopathy: Secondary | ICD-10-CM

## 2020-11-16 DIAGNOSIS — F331 Major depressive disorder, recurrent, moderate: Secondary | ICD-10-CM

## 2020-11-16 MED ORDER — ALPRAZOLAM 0.25 MG PO TABS: 0.2500 mg | ORAL_TABLET | Freq: Two times a day (BID) | ORAL | 2 refills | Status: DC | PRN

## 2020-11-16 MED ORDER — LAMOTRIGINE 150 MG PO TABS: 150.0000 mg | ORAL_TABLET | Freq: Every day | ORAL | 2 refills | Status: DC

## 2020-11-16 MED ORDER — VENLAFAXINE HCL ER 150 MG PO CP24: 300.0000 mg | ORAL_CAPSULE | Freq: Every day | ORAL | 2 refills | Status: DC

## 2020-11-16 MED ORDER — BUPROPION HCL ER (XL) 150 MG PO TB24: 150.0000 mg | ORAL_TABLET | ORAL | 2 refills | Status: DC

## 2020-11-16 NOTE — Progress Notes (Signed)
Virtual Visit via Telephone Note  I connected with Norma [name] on 11/16/20 at  1:00 PM EDT by telephone and verified that I am speaking with the correct person using two identifiers.  Location: Patient: home Provider: home office   I discussed the limitations, risks, security and privacy concerns of performing an evaluation and management service by telephone and the availability of in person appointments. I also discussed with the patient that there may be a patient responsible charge related to this service. The patient expressed understanding and agreed to proceed.     I discussed the assessment and treatment plan with the patient. The patient was provided an opportunity to ask questions and all were answered. The patient agreed with the plan and demonstrated an understanding of the instructions.   The patient was advised to call back or seek an in-person evaluation if the symptoms worsen or if the condition fails to improve as anticipated.  I provided 15 minutes of non-face-to-face time during this encounter.   Norma Spiller, MD  [facility] MD/PA/NP OP Progress Note  11/16/2020 1:08 PM Norma [name]  MRN:  [id number]  Chief Complaint:  Chief Complaint   Depression; Follow-up    HPI: This patient is a 70 year old single white female who lives alone in [place].  She is no longer working.  The patient returns for follow-up after 3 months regarding her depression and anxiety.  She states for a while she was somewhat depressed but now is feeling better.  She decided to quit her job because it was overwhelming and perhaps this is why she is feeling better.  She denies significant depression anxiety or panic attacks.  She is sleeping fairly well.  She is only had a couple episodes of twitching in her face.  She denies thoughts of self-harm or suicidal ideation Visit Diagnosis:    ICD-10-CM   1. Moderate episode of recurrent major depressive disorder ([place])  F33.1       Past Psychiatric  History: Patient was hospitalized in 2001 and went through a course of ECT.  She has been tried on Effexor Wellbutrin and Zyprexa  Past Medical History:  Past Medical History:  Diagnosis Date   Anemia    Anxiety    Bipolar disorder ([place])    Bulging lumbar disc    L3-4   Chronic daily headache    Chronic low back pain 09/20/2014   COPD (chronic obstructive pulmonary disease) (HCC)    Degenerative arthritis    Depression    Diabetes mellitus, type II ([place])    Diabetic neuropathy ([place]) 09/16/2018   DM type 2 with diabetic peripheral neuropathy ([place]) 09/20/2014   Dyslipidemia    Dyspnea    Gastroparesis    GERD (gastroesophageal reflux disease)    Heart murmur    History of hiatal hernia    Hypothyroidism    IBS (irritable bowel syndrome)    Memory difficulty 09/20/2014   Morbid obesity ([place])    Neuropathy    Obstructive sleep apnea on CPAP    Restless legs syndrome (RLS) 09/17/2012   Stroke (cerebrum) ([place]) 05/16/2017   Left parietal   Urticaria     Past Surgical History:  Procedure Laterality Date   ABDOMINAL HYSTERECTOMY     ARTHROSCOPY KNEE W/ DRILLING  06/2011   and Decemer of 2013.   Carpel tunnel  1980's   CATARACT EXTRACTION W/PHACO Right 03/21/2017   Procedure: CATARACT EXTRACTION PHACO AND INTRAOCULAR LENS PLACEMENT RIGHT EYE;  Surgeon: Baruch Goldmann, MD;  Location: [facility];  Service: Ophthalmology;  Laterality: Right;  CDE: 2.91    CATARACT EXTRACTION W/PHACO Left 04/18/2017   Procedure: CATARACT EXTRACTION PHACO AND INTRAOCULAR LENS PLACEMENT LEFT EYE;  Surgeon: Baruch Goldmann, MD;  Location: [facility];  Service: Ophthalmology;  Laterality: Left;  left   CHOLECYSTECTOMY     COLONOSCOPY WITH PROPOFOL N/A 10/04/2016   Procedure: COLONOSCOPY WITH PROPOFOL;  Surgeon: Rogene Houston, MD;  [facility];  Service: Endoscopy;  Laterality: N/A;  11:10   CORONARY ARTERY BYPASS GRAFT N/A 08/29/2017   Procedure: CORONARY ARTERY BYPASS GRAFTING (CABG) x 3 WITH ENDOSCOPIC  HARVESTING OF RIGHT SAPHENOUS VEIN;  Surgeon: Ivin Poot, MD;  Location: [place];  Service: Open Heart Surgery;  Laterality: N/A;   ESOPHAGOGASTRODUODENOSCOPY N/A 04/25/2016   Procedure: ESOPHAGOGASTRODUODENOSCOPY (EGD);  Surgeon: Rogene Houston, MD;  [facility];  Service: Endoscopy;  Laterality: N/A;  730   LEFT HEART CATH AND CORONARY ANGIOGRAPHY N/A 08/27/2017   Procedure: LEFT HEART CATH AND CORONARY ANGIOGRAPHY;  Surgeon: Sherren Mocha, MD;  Location: [facility] [facility];  Service: Cardiovascular::  pLAD 95% - p-mLAD 50%, ostD1 90% -pD1 80%; ostOM1 90%; rPDA 80%. EF ~50-55% - HK of dital Anterolateral & Apical wall.  - Rec CVTS c/s   OPEN REDUCTION INTERNAL FIXATION (ORIF) HAND Left 04/10/2020   Procedure: OPEN REDUCTION INTERNAL FIXATION (ORIF) HAND, left index finger;  Surgeon: Carole Civil, MD;  Location: [facility];  Service: Orthopedics;  Laterality: Left;  0.045 k wires   RECTAL SURGERY     fissure   SHOULDER SURGERY Left    arthroscopy in March of this year   TEE WITHOUT CARDIOVERSION N/A 08/29/2017   Procedure: TRANSESOPHAGEAL ECHOCARDIOGRAM (TEE);  Surgeon: Prescott Gum, Collier Salina, MD;  Location: [place];  Service: Open Heart Surgery;  Laterality: N/A;   TRANSTHORACIC ECHOCARDIOGRAM  06/06/2017   Mild to moderate reduced EF 40 and 45%.  Anterior septal, inferoseptal and basal to mid inferior hypokinesis.  GR 1 DD.  No significant valvular lesion    Family Psychiatric History: see below  Family History:  Family History  Problem Relation Age of Onset   Hypertension Mother    Lymphoma Mother    Depression Mother    Arthritis Father    Alcohol abuse Father    Hypertension Sister    Cancer Brother        kidney and lung   Alcohol abuse Brother    Alcohol abuse Paternal Uncle    Alcohol abuse Paternal Grandfather    Alcohol abuse Paternal Grandmother    Allergic rhinitis Neg Hx    Angioedema Neg Hx    Asthma Neg Hx    Atopy Neg Hx    Eczema Neg Hx     Immunodeficiency Neg Hx    Urticaria Neg Hx     Social History:  Social History   Socioeconomic History   Marital status: Single    Spouse name: Not on file   Number of children: 0   Years of education: 16   Highest education level: Not on file  Occupational History    Employer: [employer]  Tobacco Use   Smoking status: Former    Types: Cigarettes    Quit date: 02/04/1971    Years since quitting: 49.8   Smokeless tobacco: Never   Tobacco comments:    smoked 2 cigarettes a day  Vaping Use   Vaping Use: Never used  Substance and Sexual Activity  Alcohol use: No    Alcohol/week: 0.0 standard drinks   Drug use: No   Sexual activity: Never  Other Topics Concern   Not on file  Social History Narrative   Patient lives at home alone.    Patient has no children.    Patient has her masters in nursing.    Patient is single.    Patient drinks about 2 glasses of tea daily.   Patient is right handed.   Social Determinants of Health   Financial Resource Strain: Not on file  Food Insecurity: Not on file  Transportation Needs: Not on file  Physical Activity: Not on file  Stress: Not on file  Social Connections: Not on file    Allergies:  Allergies  Allergen Reactions   Amoxicillin Anaphylaxis and Other (See Comments)    Has patient had a PCN reaction causing immediate rash, facial/tongue/throat swelling, SOB or lightheadedness with hypotension: Yes Has patient had a PCN reaction causing severe rash involving mucus membranes or skin necrosis: Yes Has patient had a PCN reaction that required hospitalization: Yes Has patient had a PCN reaction occurring within the last 10 years: Yes If all of the above answers are "NO", then may proceed with Cephalosporin use.    Hydrocodone Anaphylaxis   Percocet [Oxycodone-Acetaminophen] Other (See Comments)    Causes chest pain /tightness. Pressure pain around her ribs.   Vancomycin Itching   Depacon [Valproic Acid] Other  (See Comments)    Causes falls    Depakote [Divalproex Sodium]     Metabolic Disorder Labs: Lab Results  Component Value Date   HGBA1C 6.6 11/08/2019   MPG 148 03/05/2019   MPG 136.98 10/30/2018   No results found for: PROLACTIN Lab Results  Component Value Date   CHOL 129 11/08/2019   TRIG 158 11/08/2019   HDL 60 11/08/2019   CHOLHDL 3.8 03/05/2019   VLDL 31 (H) 07/10/2016   LDLCALC 43 11/08/2019   LDLCALC 152 (H) 03/05/2019   Lab Results  Component Value Date   TSH 1.49 03/05/2019   TSH 2.671 10/30/2018    Therapeutic Level Labs: No results found for: LITHIUM No results found for: VALPROATE No components found for:  CBMZ  Current Medications: Current Outpatient Medications  Medication Sig Dispense Refill   ALPRAZolam (XANAX) 0.25 MG tablet Take 1 tablet (0.25 mg total) by mouth 2 (two) times daily as needed for anxiety. 60 tablet 2   aspirin EC 81 MG tablet Take 1 tablet (81 mg total) by mouth daily. 90 tablet 3   baclofen (LIORESAL) 10 MG tablet Take 1 tablet (10 mg total) by mouth at bedtime. 30 tablet 6   buPROPion (WELLBUTRIN XL) 150 MG 24 hr tablet Take 1 tablet (150 mg total) by mouth every morning. 30 tablet 2   calcium carbonate (TUMS - DOSED IN MG ELEMENTAL CALCIUM) 500 MG chewable tablet Chew 2 tablets by mouth as needed for indigestion or heartburn.      carvedilol (COREG) 6.25 MG tablet TAKE ONE TABLET BY MOUTH TWICE DAILY (MORNING ,EVENING) 180 tablet 2   cetirizine (ZYRTEC) 10 MG tablet Take 1 tablet tablet 1-2 times daily. (Patient taking differently: Take by mouth at bedtime.) 60 tablet 5   diclofenac Sodium (VOLTAREN) 1 % GEL Apply topically 4 (four) times daily.     diphenhydrAMINE (BENADRYL) 25 mg capsule Take 25 mg by mouth as needed.     EPINEPHrine 0.3 mg/0.3 mL IJ SOAJ injection Inject 0.3 mg into the muscle once.  ferrous sulfate 325 (65 FE) MG tablet Take 325 mg by mouth daily after supper.     gabapentin (NEURONTIN) 400 MG capsule Take 1  capsule in the morning, take 2 midday, take 1 at night 360 capsule 1   glipiZIDE (GLUCOTROL XL) 5 MG 24 hr tablet TAKE ONE TABLET BY MOUTH DAILY WITH BREAKFAST 90 tablet 0   lamoTRIgine (LAMICTAL) 150 MG tablet Take 1 tablet (150 mg total) by mouth at bedtime. 28 tablet 2   levothyroxine (SYNTHROID) 112 MCG tablet Take 112 mcg by mouth daily before breakfast.     levothyroxine (SYNTHROID, LEVOTHROID) 100 MCG tablet Take 100 mcg by mouth daily before breakfast. (Patient not taking: Reported on 11/07/2020)     losartan (COZAAR) 25 MG tablet TAKE ONE TABLET BY MOUTH DAILY (MORNING) 90 tablet 2   meclizine (ANTIVERT) 25 MG tablet Take 1 tablet (25 mg total) by mouth 3 (three) times daily as needed for dizziness. (Patient taking differently: Take 25 mg by mouth daily as needed for dizziness.) 30 tablet 0   meloxicam (MOBIC) 7.5 MG tablet Take 1 tablet (7.5 mg total) by mouth daily. 30 tablet 5   metFORMIN (GLUCOPHAGE) 500 MG tablet TAKE ONE TABLET BY MOUTH TWICE DAILY WITH A MEAL (MORNING ,EVENING) 180 tablet 0   pantoprazole (PROTONIX) 40 MG tablet Take 1 tablet (40 mg total) by mouth every evening. 90 tablet 3   REPATHA SURECLICK [id number] MG/ML SOAJ INJECT ONE DOSE INTO SKIN EVERY 14 DAYS. 2 mL 11   rOPINIRole (REQUIP) 3 MG tablet TAKE ONE TABLET BY MOUTH EVERY MORNING, ,NOON AND AT BEDTIME. (PRT PT MORNING,EVENING,BEDTIME) 270 tablet 1   torsemide (DEMADEX) 20 MG tablet Take 2 tablets (40 mg total) by mouth daily. 180 tablet 3   traMADol (ULTRAM) 50 MG tablet Take 100 mg by mouth 4 (four) times daily as needed.      venlafaxine XR (EFFEXOR-XR) 150 MG 24 hr capsule Take 2 capsules (300 mg total) by mouth at bedtime. 60 capsule 2   Vitamin D, Ergocalciferol, (DRISDOL) 1.25 MG (50000 UNIT) CAPS capsule Take 50,000 Units by mouth every 7 (seven) days.     No current facility-administered medications for this visit.     Musculoskeletal: Strength & Muscle Tone: within normal limits Gait & Station:  normal Patient leans: N/A  Psychiatric Specialty Exam: Review of Systems  Cardiovascular:  Positive for leg swelling.  Musculoskeletal:  Positive for arthralgias and back pain.  All other systems reviewed and are negative.  There were no vitals taken for this visit.There is no height or weight on file to calculate BMI.  General Appearance: NA  Eye Contact:  NA  Speech:  Clear and Coherent  Volume:  Normal  Mood:  Euthymic  Affect:  NA  Thought Process:  Goal Directed  Orientation:  Full (Time, Place, and Person)  Thought Content: WDL   Suicidal Thoughts:  No  Homicidal Thoughts:  No  Memory:  Immediate;   Good Recent;   Good Remote;   Good  Judgement:  Good  Insight:  Fair  Psychomotor Activity:  Decreased  Concentration:  Concentration: Good and Attention Span: Good  Recall:  Good  Fund of Knowledge: Good  Language: Good  Akathisia:  No  Handed:  Right  AIMS (if indicated): not done  Assets:  Communication Skills Desire for Improvement Resilience Social Support  ADL's:  Intact  Cognition: WNL  Sleep:  Good   Screenings: Mini-Mental    Personnel officer Visit from  02/18/2018 in [facility] Office Visit from 09/17/2017 in [facility]  Total Score (max 30 points ) 28 28      PHQ2-9    Flowsheet Row Video Visit from 11/16/2020 in [facility] Video Visit from 08/03/2020 in [facility] from 05/17/2019 in [facility] Office Visit from 09/11/2017 in [facility] Office Visit from 04/22/2017 in [facility]  PHQ-2 Total Score 0 0 1 0 0      Flowsheet Row Video Visit from 11/16/2020 in [facility] Video Visit from 08/03/2020 in [facility] from 06/20/2020 in [facility] No Risk No Risk No Risk        Assessment and Plan: This patient is a 70 year old female with a history of depression anxiety mood swings and insomnia.  She continues to do well on her current regimen.  She will continue Wellbutrin XL 150 mg daily as well as Effexor XR 300 mg daily for depression, Lamictal 100 mg daily for mood stabilization and Xanax 0.25 mg once or twice a day as needed for sleep or anxiety.  She will return to see me in 3 months   Norma Spiller, MD 11/16/2020, 1:08 PM

## 2020-11-21 ENCOUNTER — Other Ambulatory Visit: Payer: Self-pay | Admitting: Neurology

## 2020-11-21 ENCOUNTER — Other Ambulatory Visit: Payer: Self-pay | Admitting: Cardiology

## 2020-11-21 ENCOUNTER — Telehealth: Payer: Self-pay | Admitting: Internal Medicine

## 2020-11-21 DIAGNOSIS — E11319 Type 2 diabetes mellitus with unspecified diabetic retinopathy without macular edema: Secondary | ICD-10-CM | POA: Diagnosis not present

## 2020-11-21 NOTE — Telephone Encounter (Signed)
PA for repatha sureclick submitted via CMM (Key: [id number])

## 2020-11-22 NOTE — Telephone Encounter (Signed)
PA Case: [id number], Status: Approved, Coverage Starts on: 04/15/2020 12:00:00 AM, Coverage Ends on: 04/14/2021 12:00:00 AM

## 2020-11-23 ENCOUNTER — Ambulatory Visit: Payer: Medicare Other | Admitting: Orthopedic Surgery

## 2020-11-24 DIAGNOSIS — E119 Type 2 diabetes mellitus without complications: Secondary | ICD-10-CM | POA: Diagnosis not present

## 2020-11-24 DIAGNOSIS — E782 Mixed hyperlipidemia: Secondary | ICD-10-CM | POA: Diagnosis not present

## 2020-11-29 ENCOUNTER — Telehealth: Payer: Self-pay | Admitting: Neurology

## 2020-11-29 DIAGNOSIS — E039 Hypothyroidism, unspecified: Secondary | ICD-10-CM | POA: Diagnosis not present

## 2020-11-29 DIAGNOSIS — E782 Mixed hyperlipidemia: Secondary | ICD-10-CM | POA: Diagnosis not present

## 2020-11-29 DIAGNOSIS — E1121 Type 2 diabetes mellitus with diabetic nephropathy: Secondary | ICD-10-CM | POA: Insufficient documentation

## 2020-11-29 DIAGNOSIS — Z8673 Personal history of transient ischemic attack (TIA), and cerebral infarction without residual deficits: Secondary | ICD-10-CM | POA: Diagnosis not present

## 2020-11-29 DIAGNOSIS — I5032 Chronic diastolic (congestive) heart failure: Secondary | ICD-10-CM | POA: Diagnosis not present

## 2020-11-29 DIAGNOSIS — N1832 Chronic kidney disease, stage 3b: Secondary | ICD-10-CM | POA: Insufficient documentation

## 2020-11-29 DIAGNOSIS — L501 Idiopathic urticaria: Secondary | ICD-10-CM | POA: Insufficient documentation

## 2020-11-29 DIAGNOSIS — Z8639 Personal history of other endocrine, nutritional and metabolic disease: Secondary | ICD-10-CM | POA: Insufficient documentation

## 2020-11-29 DIAGNOSIS — D509 Iron deficiency anemia, unspecified: Secondary | ICD-10-CM | POA: Insufficient documentation

## 2020-11-29 DIAGNOSIS — I2510 Atherosclerotic heart disease of native coronary artery without angina pectoris: Secondary | ICD-10-CM | POA: Diagnosis not present

## 2020-11-29 DIAGNOSIS — M545 Low back pain, unspecified: Secondary | ICD-10-CM | POA: Diagnosis not present

## 2020-11-29 DIAGNOSIS — G4733 Obstructive sleep apnea (adult) (pediatric): Secondary | ICD-10-CM | POA: Diagnosis not present

## 2020-11-29 DIAGNOSIS — E1140 Type 2 diabetes mellitus with diabetic neuropathy, unspecified: Secondary | ICD-10-CM | POA: Diagnosis not present

## 2020-11-29 DIAGNOSIS — F3181 Bipolar II disorder: Secondary | ICD-10-CM | POA: Diagnosis not present

## 2020-11-29 NOTE — Telephone Encounter (Signed)
Pt asking for a call to discuss a call she received about an order for a Bi Pap and medication.

## 2020-11-29 NOTE — Telephone Encounter (Signed)
Called the patient back to clarify her question. Advised the patient that the only thing I see is where she is suppose to have a ONO on CPAP completed. Pt states she has not completed that yet. Advised I would check on the status of her getting that done and made sure she had that the companies number. Pt verbalized understanding. Pt had no questions at this time but was encouraged to call back if questions arise.

## 2020-12-04 DIAGNOSIS — I10 Essential (primary) hypertension: Secondary | ICD-10-CM | POA: Diagnosis not present

## 2020-12-04 DIAGNOSIS — E119 Type 2 diabetes mellitus without complications: Secondary | ICD-10-CM | POA: Diagnosis not present

## 2020-12-13 DIAGNOSIS — E119 Type 2 diabetes mellitus without complications: Secondary | ICD-10-CM | POA: Diagnosis not present

## 2020-12-13 DIAGNOSIS — I10 Essential (primary) hypertension: Secondary | ICD-10-CM | POA: Diagnosis not present

## 2020-12-13 DIAGNOSIS — E1165 Type 2 diabetes mellitus with hyperglycemia: Secondary | ICD-10-CM | POA: Diagnosis not present

## 2020-12-13 NOTE — Telephone Encounter (Signed)
Pt called, today spoke with [employer] about order for pulse oximetry. Was cancelled because could not get in touch with me. Would like a call from the nurse.

## 2020-12-14 NOTE — Telephone Encounter (Signed)
Called [employer] and spoke with them. They have been attempting to reach out to the pt multiple times. Glenda from the [employer] office in [place] will call the patient.   **If pt calls back, please inform her that [employer] has the order and has been attempting to contact the patient multiple times. The order has not been cancelled and they will try calling her again

## 2020-12-21 ENCOUNTER — Encounter: Payer: Self-pay | Admitting: Orthopedic Surgery

## 2020-12-21 ENCOUNTER — Other Ambulatory Visit: Payer: Self-pay

## 2020-12-21 ENCOUNTER — Ambulatory Visit (INDEPENDENT_AMBULATORY_CARE_PROVIDER_SITE_OTHER): Payer: Medicare Other | Admitting: Orthopedic Surgery

## 2020-12-21 VITALS — BP 129/61 | HR 96 | Ht 60.0 in | Wt 247.2 lb

## 2020-12-21 DIAGNOSIS — Z6841 Body Mass Index (BMI) 40.0 and over, adult: Secondary | ICD-10-CM | POA: Diagnosis not present

## 2020-12-21 DIAGNOSIS — M5410 Radiculopathy, site unspecified: Secondary | ICD-10-CM | POA: Diagnosis not present

## 2020-12-21 DIAGNOSIS — M47817 Spondylosis without myelopathy or radiculopathy, lumbosacral region: Secondary | ICD-10-CM | POA: Diagnosis not present

## 2020-12-21 DIAGNOSIS — I255 Ischemic cardiomyopathy: Secondary | ICD-10-CM | POA: Diagnosis not present

## 2020-12-21 NOTE — Patient Instructions (Signed)
While we are working on your approval for MRI please go ahead and call to schedule your appointment with [facility] within at least one (1) week.   Central Scheduling [phone]  

## 2020-12-21 NOTE — Progress Notes (Signed)
Chief Complaint  Patient presents with   Knee Pain    Bilateral/ knees are a little better but my shins are no better. My walking is no better either.   Encounter Diagnoses  Name Primary?   Radicular pain of both lower extremities Yes   Body mass index 45.0-49.9, adult (HCC)    Morbid obesity (HCC)    Lumbosacral spondylosis without myelopathy    Sarahjean continues with bilateral shin pain is unclear to me the source of her discomfort  She says it does get worse when she is walking gets better when she sits or lies down  She has had an MRI back in 2020 as well as 2016 she does have some degenerative disc disease does not seem to have a lot of spinal stenosis however gabapentin does not seem to help her symptoms she is actually had decreased some movement because of drowsiness  She is awake and alert she is oriented mood is pleasant she walks with a walker she has bilateral tenderness in her shins but is not really that bad  Seems to have normal strength in the lower extremities  Recommend repeat MRI to look for spinal stenosis and possibly proceed with epidurals after that if it is positive

## 2020-12-22 ENCOUNTER — Encounter: Payer: Self-pay | Admitting: Neurology

## 2020-12-25 NOTE — Telephone Encounter (Signed)
I called the patient to let her know we received her concern. She is aware we sent a message to Marchelle Gearing and Vanessa Ralphs at [facility] to check on this issue. If the patient has not heard back from them this week, she will notify our office for further assistance.

## 2020-12-29 DIAGNOSIS — R0683 Snoring: Secondary | ICD-10-CM | POA: Diagnosis not present

## 2020-12-29 DIAGNOSIS — G4730 Sleep apnea, unspecified: Secondary | ICD-10-CM | POA: Diagnosis not present

## 2021-01-01 ENCOUNTER — Ambulatory Visit (HOSPITAL_COMMUNITY): Payer: Medicare Other

## 2021-01-03 ENCOUNTER — Telehealth: Payer: Self-pay | Admitting: Radiology

## 2021-01-03 NOTE — Telephone Encounter (Signed)
Patient called and said that she has had nausea x 2 weeks now and wonders if it is the meloxicam.  She is calling per Dr Ruthe Mannan instruction to call if any GI issues. Please call her to discuss.

## 2021-01-03 NOTE — Telephone Encounter (Signed)
I told her to d/c the Meloxicam since it caused Nausea is there anything you want to replace it with?

## 2021-01-04 ENCOUNTER — Ambulatory Visit: Payer: Medicare Other | Admitting: Orthopedic Surgery

## 2021-01-04 NOTE — Telephone Encounter (Signed)
I called patient to advise and she says ok, will call us back if needed.

## 2021-01-05 DIAGNOSIS — E119 Type 2 diabetes mellitus without complications: Secondary | ICD-10-CM | POA: Diagnosis not present

## 2021-01-08 ENCOUNTER — Encounter: Payer: Self-pay | Admitting: Neurology

## 2021-01-08 ENCOUNTER — Telehealth: Payer: Self-pay | Admitting: Neurology

## 2021-01-08 NOTE — Telephone Encounter (Signed)
Received a report where the patient completed the overnight oximetry test while using on her CPAP and oxygen level.  The data shows there was only 2 hrs and 1 sec recorded. During that short amount of time 1 hour and 57 min of the time was spent> 90%. I don't know that this is enough data to justify there is no need for oxygen changes, so I have contacted the company to evaluate further and repeat the testing.

## 2021-01-10 ENCOUNTER — Other Ambulatory Visit: Payer: Self-pay

## 2021-01-10 ENCOUNTER — Ambulatory Visit (HOSPITAL_COMMUNITY)
Admission: RE | Admit: 2021-01-10 | Discharge: 2021-01-10 | Disposition: A | Discharge status: Home / Self Care | Payer: Medicare Other | Source: Ambulatory Visit | Attending: Orthopedic Surgery | Admitting: Orthopedic Surgery

## 2021-01-10 DIAGNOSIS — M5410 Radiculopathy, site unspecified: Secondary | ICD-10-CM | POA: Diagnosis not present

## 2021-01-10 DIAGNOSIS — M5126 Other intervertebral disc displacement, lumbar region: Secondary | ICD-10-CM | POA: Diagnosis not present

## 2021-01-10 DIAGNOSIS — M48061 Spinal stenosis, lumbar region without neurogenic claudication: Secondary | ICD-10-CM | POA: Diagnosis not present

## 2021-01-10 DIAGNOSIS — M47816 Spondylosis without myelopathy or radiculopathy, lumbar region: Secondary | ICD-10-CM | POA: Diagnosis not present

## 2021-01-10 DIAGNOSIS — M47817 Spondylosis without myelopathy or radiculopathy, lumbosacral region: Secondary | ICD-10-CM | POA: Diagnosis not present

## 2021-01-11 ENCOUNTER — Encounter: Payer: Self-pay | Admitting: Neurology

## 2021-01-11 ENCOUNTER — Ambulatory Visit (INDEPENDENT_AMBULATORY_CARE_PROVIDER_SITE_OTHER): Payer: Medicare Other | Admitting: Neurology

## 2021-01-11 VITALS — BP 142/74 | HR 89 | Ht 60.0 in | Wt 242.0 lb

## 2021-01-11 DIAGNOSIS — Z5181 Encounter for therapeutic drug level monitoring: Secondary | ICD-10-CM

## 2021-01-11 DIAGNOSIS — I255 Ischemic cardiomyopathy: Secondary | ICD-10-CM

## 2021-01-11 DIAGNOSIS — E1142 Type 2 diabetes mellitus with diabetic polyneuropathy: Secondary | ICD-10-CM | POA: Diagnosis not present

## 2021-01-11 DIAGNOSIS — R413 Other amnesia: Secondary | ICD-10-CM

## 2021-01-11 DIAGNOSIS — G2581 Restless legs syndrome: Secondary | ICD-10-CM

## 2021-01-11 MED ORDER — BACLOFEN 10 MG PO TABS: 10.0000 mg | ORAL_TABLET | Freq: Every day | ORAL | 3 refills | Status: DC

## 2021-01-11 MED ORDER — GABAPENTIN 400 MG PO CAPS: ORAL_CAPSULE | ORAL | 3 refills | Status: DC

## 2021-01-11 MED ORDER — ROPINIROLE HCL 3 MG PO TABS: ORAL_TABLET | ORAL | 3 refills | Status: AC

## 2021-01-11 NOTE — Progress Notes (Signed)
Reason for visit: Diabetic peripheral neuropathy, sleep apnea, restless leg syndrome, gait disorder, low back pain  Norma [name] is an 70 y.o. female  History of present illness:  Norma [name] is a 70 year old right-handed white female with a history of diabetes associated with a peripheral neuropathy.  The patient has some discomfort in the feet at night, her main complaint however is back pain with discomfort going into the right hip and upper thigh level.  She just had MRI of the lumbar spine yesterday, the formal reading is not yet available.  By my evaluation, there is no critical spinal stenosis, severe neuroforaminal stenosis appears not to be present.  The patient reports that she does have some gait instability and uses a walker oftentimes with ambulation.  The pain in her back and thigh area is worse with sitting.  She has cut back on her gabapentin due to drowsiness.  The patient has not noted any change in discomfort with the lower dose of gabapentin.  The patient has restless leg syndrome, if she takes her baclofen and Requip on a timely basis, this is significantly improved.  She is on CPAP at night.  She indicates that she sleeps fairly well.  She recently was placed on Jardiance for her diabetes with some good benefit.  She remains markedly overweight.  Past Medical History:  Diagnosis Date   Anemia    Anxiety    Bipolar disorder ([place])    Bulging lumbar disc    L3-4   Chronic daily headache    Chronic low back pain 09/20/2014   COPD (chronic obstructive pulmonary disease) (HCC)    Degenerative arthritis    Depression    Diabetes mellitus, type II ([place])    Diabetic neuropathy ([place]) 09/16/2018   DM type 2 with diabetic peripheral neuropathy ([place]) 09/20/2014   Dyslipidemia    Dyspnea    Gastroparesis    GERD (gastroesophageal reflux disease)    Heart murmur    History of hiatal hernia    Hypothyroidism    IBS (irritable bowel syndrome)    Memory difficulty 09/20/2014    Morbid obesity ([place])    Neuropathy    Obstructive sleep apnea on CPAP    Restless legs syndrome (RLS) 09/17/2012   Stroke (cerebrum) ([place]) 05/16/2017   Left parietal   Urticaria     Past Surgical History:  Procedure Laterality Date   ABDOMINAL HYSTERECTOMY     ARTHROSCOPY KNEE W/ DRILLING  06/2011   and Dece[place] of 2013.   Carpel tunnel  1980's   CATARACT EXTRACTION W/PHACO Right 03/21/2017   Procedure: CATARACT EXTRACTION PHACO AND INTRAOCULAR LENS PLACEMENT RIGHT EYE;  Surgeon: Baruch Goldmann, MD;  Location: [facility];  Service: Ophthalmology;  Laterality: Right;  CDE: 2.91    CATARACT EXTRACTION W/PHACO Left 04/18/2017   Procedure: CATARACT EXTRACTION PHACO AND INTRAOCULAR LENS PLACEMENT LEFT EYE;  Surgeon: Baruch Goldmann, MD;  Location: [facility];  Service: Ophthalmology;  Laterality: Left;  left   CHOLECYSTECTOMY     COLONOSCOPY WITH PROPOFOL N/A 10/04/2016   Procedure: COLONOSCOPY WITH PROPOFOL;  Surgeon: Rogene Houston, MD;  [facility];  Service: Endoscopy;  Laterality: N/A;  11:10   CORONARY ARTERY BYPASS GRAFT N/A 08/29/2017   Procedure: CORONARY ARTERY BYPASS GRAFTING (CABG) x 3 WITH ENDOSCOPIC HARVESTING OF RIGHT SAPHENOUS VEIN;  Surgeon: Ivin Poot, MD;  Location: [facility];  Service: Open Heart Surgery;  Laterality: N/A;   ESOPHAGOGASTRODUODENOSCOPY N/A 04/25/2016   Procedure:  ESOPHAGOGASTRODUODENOSCOPY (EGD);  Surgeon: Rogene Houston, MD;  [facility];  Service: Endoscopy;  Laterality: N/A;  730   LEFT HEART CATH AND CORONARY ANGIOGRAPHY N/A 08/27/2017   Procedure: LEFT HEART CATH AND CORONARY ANGIOGRAPHY;  Surgeon: Sherren Mocha, MD;  Location: [facility];  Service: Cardiovascular::  pLAD 95% - p-mLAD 50%, ostD1 90% -pD1 80%; ostOM1 90%; rPDA 80%. EF ~50-55% - HK of dital Anterolateral & Apical wall.  - Rec CVTS c/s   OPEN REDUCTION INTERNAL FIXATION (ORIF) HAND Left 04/10/2020   Procedure: OPEN REDUCTION INTERNAL FIXATION (ORIF) HAND, left index  finger;  Surgeon: Carole Civil, MD;  Location: [facility];  Service: Orthopedics;  Laterality: Left;  0.045 k wires   RECTAL SURGERY     fissure   SHOULDER SURGERY Left    arthroscopy in March of this year   TEE WITHOUT CARDIOVERSION N/A 08/29/2017   Procedure: TRANSESOPHAGEAL ECHOCARDIOGRAM (TEE);  Surgeon: Prescott Gum, Collier Salina, MD;  Location: [facility];  Service: Open Heart Surgery;  Laterality: N/A;   TRANSTHORACIC ECHOCARDIOGRAM  06/06/2017   Mild to moderate reduced EF 40 and 45%.  Anterior septal, inferoseptal and basal to mid inferior hypokinesis.  GR 1 DD.  No significant valvular lesion    Family History  Problem Relation Age of Onset   Hypertension Mother    Lymphoma Mother    Depression Mother    Arthritis Father    Alcohol abuse Father    Hypertension Sister    Cancer Brother        kidney and lung   Alcohol abuse Brother    Alcohol abuse Paternal Uncle    Alcohol abuse Paternal Grandfather    Alcohol abuse Paternal Grandmother    Allergic rhinitis Neg Hx    Angioedema Neg Hx    Asthma Neg Hx    Atopy Neg Hx    Eczema Neg Hx    Immunodeficiency Neg Hx    Urticaria Neg Hx     Social history:  reports that she quit smoking about 49 years ago. Her smoking use included cigarettes. She has never used smokeless tobacco. She reports that she does not drink alcohol and does not use drugs.    Allergies  Allergen Reactions   Amoxicillin Anaphylaxis and Other (See Comments)    Has patient had a PCN reaction causing immediate rash, facial/tongue/throat swelling, SOB or lightheadedness with hypotension: Yes Has patient had a PCN reaction causing severe rash involving mucus membranes or skin necrosis: Yes Has patient had a PCN reaction that required hospitalization: Yes Has patient had a PCN reaction occurring within the last 10 years: Yes If all of the above answers are "NO", then may proceed with Cephalosporin use.    Hydrocodone Anaphylaxis   Percocet  [Oxycodone-Acetaminophen] Other (See Comments)    Causes chest pain /tightness. Pressure pain around her ribs.   Vancomycin Itching   Depacon [Valproic Acid] Other (See Comments)    Causes falls    Depakote [Divalproex Sodium]     Medications:  Prior to Admission medications   Medication Sig Start Date End Date Taking? Authorizing Provider  ALPRAZolam (XANAX) 0.25 MG tablet Take 1 tablet (0.25 mg total) by mouth 2 (two) times daily as needed for anxiety. 11/16/20   Cloria Spring, MD  aspirin EC 81 MG tablet Take 1 tablet (81 mg total) by mouth daily. 01/20/18   Arnoldo Lenis, MD  baclofen (LIORESAL) 10 MG tablet Take 1 tablet (10 mg total)  by mouth at bedtime. 07/06/20   Suzzanne Cloud, NP  buPROPion (WELLBUTRIN XL) 150 MG 24 hr tablet Take 1 tablet (150 mg total) by mouth every morning. 11/16/20 11/16/21  Cloria Spring, MD  calcium carbonate (TUMS - DOSED IN MG ELEMENTAL CALCIUM) 500 MG chewable tablet Chew 2 tablets by mouth as needed for indigestion or heartburn.     [provider]  carvedilol (COREG) 6.25 MG tablet TAKE ONE TABLET BY MOUTH TWICE DAILY (MORNING ,EVENING) 07/10/20   Arnoldo Lenis, MD  cetirizine (ZYRTEC) 10 MG tablet Take 1 tablet tablet 1-2 times daily. Patient taking differently: Take by mouth at bedtime. 04/25/19   Valentina Shaggy, MD  diclofenac Sodium (VOLTAREN) 1 % GEL Apply topically 4 (four) times daily.    [provider]  diphenhydrAMINE (BENADRYL) 25 mg capsule Take 25 mg by mouth as needed.    [provider]  empagliflozin (JARDIANCE) 10 MG TABS tablet Take 10 mg by mouth daily.    [provider]  EPINEPHrine 0.3 mg/0.3 mL IJ SOAJ injection Inject 0.3 mg into the muscle once.    [provider]  ferrous sulfate 325 (65 FE) MG tablet Take 325 mg by mouth daily after supper.    [provider]  gabapentin (NEURONTIN) 400 MG capsule TAKE ONE CAPSULE BY MOUTH EVERY MORNING, TWO CAPSULES MIDDAY AND  ONE CAPSULE AT NIGHT. 11/21/20   Kathrynn Ducking, MD  glipiZIDE (GLUCOTROL XL) 5 MG 24 hr tablet TAKE ONE TABLET BY MOUTH DAILY WITH BREAKFAST 10/14/19   Cassandria Anger, MD  lamoTRIgine (LAMICTAL) 150 MG tablet Take 1 tablet (150 mg total) by mouth at bedtime. 11/16/20   Cloria Spring, MD  levothyroxine (SYNTHROID) 112 MCG tablet Take 112 mcg by mouth daily before breakfast.    [provider]  levothyroxine (SYNTHROID, LEVOTHROID) 100 MCG tablet Take 100 mcg by mouth daily before breakfast.    [provider]  losartan (COZAAR) 25 MG tablet TAKE ONE TABLET BY MOUTH DAILY (MORNING) 11/21/20   Branch, Alphonse Guild, MD  meclizine (ANTIVERT) 25 MG tablet Take 1 tablet (25 mg total) by mouth 3 (three) times daily as needed for dizziness. Patient taking differently: Take 25 mg by mouth daily as needed for dizziness. 03/29/14   Virgel Manifold, MD  meloxicam (MOBIC) 7.5 MG tablet Take 1 tablet (7.5 mg total) by mouth daily. 10/23/20   Carole Civil, MD  metFORMIN (GLUCOPHAGE) 500 MG tablet TAKE ONE TABLET BY MOUTH TWICE DAILY WITH A MEAL (MORNING ,EVENING) 10/14/19   Nida, Marella Chimes, MD  pantoprazole (PROTONIX) 40 MG tablet Take 1 tablet (40 mg total) by mouth every evening. 05/08/20   Rogene Houston, MD  REPATHA SURECLICK 734 MG/ML SOAJ INJECT ONE DOSE INTO SKIN EVERY 14 DAYS. 05/19/20   Hilty, Nadean Corwin, MD  rOPINIRole (REQUIP) 3 MG tablet TAKE ONE TABLET BY MOUTH EVERY MORNING, ,NOON AND AT BEDTIME. (PRT PT MORNING,EVENING,BEDTIME) 07/06/20   Suzzanne Cloud, NP  torsemide (DEMADEX) 20 MG tablet Take 2 tablets (40 mg total) by mouth daily. 11/07/20 02/05/21  Verta Ellen., NP  traMADol Veatrice Bourbon) 50 MG tablet Take 100 mg by mouth 4 (four) times daily as needed.     [provider]  venlafaxine XR (EFFEXOR-XR) 150 MG 24 hr capsule Take 2 capsules (300 mg total) by mouth at bedtime. 11/16/20   Cloria Spring, MD  Vitamin D, Ergocalciferol, (DRISDOL) 1.25 MG (50000 UNIT)  CAPS capsule  Take 50,000 Units by mouth every 7 (seven) days.    [provider]    ROS:  Out of a complete 14 system review of symptoms, the patient complains only of the following symptoms, and all other reviewed systems are negative.  Restless legs Walking difficulty Low back pain  Blood pressure (!) 142/74, pulse 89, height 5' (1.524 m), weight 242 lb (109.8 kg), SpO2 98 %.  Physical Exam  General: The patient is alert and cooperative at the time of the examination.  The patient is markedly obese.  Skin: No significant peripheral edema is noted.  A lipoma is noted on the left ankle.   Neurologic Exam  Mental status: The patient is alert and oriented x 3 at the time of the examination. The patient has apparent normal recent and remote memory, with an apparently normal attention span and concentration ability.   Cranial nerves: Facial symmetry is present. Speech is normal, no aphasia or dysarthria is noted. Extraocular movements are full. Visual fields are full.  Motor: The patient has good strength in all 4 extremities.  Sensory examination: Soft touch sensation is symmetric on the face, arms, and legs.  Coordination: The patient has good finger-nose-finger and heel-to-shin bilaterally.  Gait and station: The patient has a wide-based gait, she can walk short distances independently but usually uses a walker.  Tandem gait was not attempted.  Romberg is negative but is slightly unsteady.  Reflexes: Deep tendon reflexes are symmetric.   Assessment/Plan:  1.  Diabetic peripheral neuropathy  2.  Restless leg syndrome  3.  Gait disorder  4.  Low back pain  The low back pain the patient reports may be related to facet joint arthritis.  The patient will adjust her gabapentin taking 400 mg in the morning and 800 mg in the evening.  A prescription was sent in for the baclofen and for the Requip to continue at the current level.  She will follow-up here in 6 months,  in the future she can be followed through Dr. Brett Fairy.  Jill Alexanders MD 01/11/2021 10:12 AM  [facility] [address] [place], [place] [address]  Phone [phone] Fax [phone]

## 2021-01-12 DIAGNOSIS — E1165 Type 2 diabetes mellitus with hyperglycemia: Secondary | ICD-10-CM | POA: Diagnosis not present

## 2021-01-12 DIAGNOSIS — I10 Essential (primary) hypertension: Secondary | ICD-10-CM | POA: Diagnosis not present

## 2021-01-12 LAB — COMPREHENSIVE METABOLIC PANEL
ALT: 13 IU/L (ref 0–32)
AST: 13 IU/L (ref 0–40)
Albumin/Globulin Ratio: 1.5 (ref 1.2–2.2)
Albumin: 4.1 g/dL (ref 3.8–4.8)
Alkaline Phosphatase: 75 IU/L (ref 44–121)
BUN/Creatinine Ratio: 17 (ref 12–28)
BUN: 26 mg/dL (ref 8–27)
Bilirubin Total: 0.2 mg/dL (ref 0.0–1.2)
CO2: 27 mmol/L (ref 20–29)
Calcium: 9.6 mg/dL (ref 8.7–10.3)
Chloride: 99 mmol/L (ref 96–106)
Creatinine, Ser: 1.56 mg/dL — ABNORMAL HIGH (ref 0.57–1.00)
Globulin, Total: 2.8 g/dL (ref 1.5–4.5)
Glucose: 179 mg/dL — ABNORMAL HIGH (ref 70–99)
Potassium: 4.6 mmol/L (ref 3.5–5.2)
Sodium: 141 mmol/L (ref 134–144)
Total Protein: 6.9 g/dL (ref 6.0–8.5)
eGFR: 36 mL/min/{1.73_m2} — ABNORMAL LOW (ref >59)

## 2021-01-15 ENCOUNTER — Ambulatory Visit (INDEPENDENT_AMBULATORY_CARE_PROVIDER_SITE_OTHER): Payer: Medicare Other | Admitting: Orthopedic Surgery

## 2021-01-15 ENCOUNTER — Other Ambulatory Visit: Payer: Self-pay

## 2021-01-15 ENCOUNTER — Encounter: Payer: Self-pay | Admitting: Orthopedic Surgery

## 2021-01-15 DIAGNOSIS — M5410 Radiculopathy, site unspecified: Secondary | ICD-10-CM

## 2021-01-15 DIAGNOSIS — I255 Ischemic cardiomyopathy: Secondary | ICD-10-CM

## 2021-01-15 DIAGNOSIS — M48062 Spinal stenosis, lumbar region with neurogenic claudication: Secondary | ICD-10-CM

## 2021-01-15 NOTE — Progress Notes (Signed)
Chief Complaint  Patient presents with   Results    Review MRI scan   Norma [name] continues with bilateral shin pain is unclear to me the source of her discomfort  She says it does get worse when she is walking gets better when she sits or lies down  She has had an MRI back in 2020 as well as 2016 she does have some degenerative disc disease does not seem to have a lot of spinal stenosis however gabapentin does not seem to help her symptoms she is actually had decreased some movement because of drowsiness   MRI SEPT 28TH Disc spaces: Disc desiccation throughout the lumbar spine. Mild disc height loss at L4-5 with mild reactive endplate changes.   T12-L1: Minimal broad-based disc bulge. No foraminal or central canal stenosis.   L1-L2: Mild broad-based disc bulge. Mild bilateral facet arthropathy. No foraminal or central canal stenosis.   L2-L3: Mild broad-based disc bulge. Moderate bilateral facet arthropathy with ligamentum flavum infolding. Moderate spinal stenosis. No foraminal stenosis.   L3-L4: Mild broad-based disc bulge with a left far-lateral small disc extrusion. Moderate bilateral facet arthropathy. Moderate spinal stenosis. No foraminal stenosis.   L4-L5: Mild broad-based disc bulge. Moderate bilateral facet arthropathy. Mild spinal stenosis. Mild right foraminal stenosis. No left foraminal stenosis.   L5-S1: Mild broad-based disc bulge. Mild bilateral facet arthropathy. No foraminal or central canal stenosis.   IMPRESSION: 1. Lumbar spine spondylosis as described above. 2. No acute osseous injury of the lumbar spine.  Note patient says her knee popped.  Right knee pop this week and had trouble walking for couple of days seem to respond well to aspirin.  Exam shows tenderness in the popliteal fossa chronic motion loss in right knee nothing worse than before.  Seem to have popped a Baker's cyst no further treatment necessary  Neurosurgical referral regarding the spinal  stenosis

## 2021-01-15 NOTE — Patient Instructions (Signed)
You have been referred to [facility] on [address] in [place], they will call you with appointment. If you have not heard anything in one week call them to schedule [phone]   

## 2021-01-16 ENCOUNTER — Encounter (INDEPENDENT_AMBULATORY_CARE_PROVIDER_SITE_OTHER): Payer: Self-pay

## 2021-01-17 ENCOUNTER — Other Ambulatory Visit (HOSPITAL_COMMUNITY): Payer: Self-pay | Admitting: Psychiatry

## 2021-01-18 ENCOUNTER — Other Ambulatory Visit (INDEPENDENT_AMBULATORY_CARE_PROVIDER_SITE_OTHER): Payer: Self-pay

## 2021-01-18 DIAGNOSIS — R112 Nausea with vomiting, unspecified: Secondary | ICD-10-CM

## 2021-01-18 MED ORDER — ONDANSETRON HCL 4 MG PO TABS: 4.0000 mg | ORAL_TABLET | Freq: Three times a day (TID) | ORAL | 0 refills | Status: DC | PRN

## 2021-01-19 ENCOUNTER — Telehealth (INDEPENDENT_AMBULATORY_CARE_PROVIDER_SITE_OTHER): Payer: Self-pay | Admitting: *Deleted

## 2021-01-19 DIAGNOSIS — R0683 Snoring: Secondary | ICD-10-CM | POA: Diagnosis not present

## 2021-01-19 DIAGNOSIS — M48061 Spinal stenosis, lumbar region without neurogenic claudication: Secondary | ICD-10-CM | POA: Diagnosis not present

## 2021-01-19 DIAGNOSIS — G4730 Sleep apnea, unspecified: Secondary | ICD-10-CM | POA: Diagnosis not present

## 2021-01-19 NOTE — Telephone Encounter (Signed)
Late entry. Pt called 01/18/21. States she was taking meloxicam for about one month and stomach got messed up. Nausea and heartburn. States dr Aline Brochure stopped med due to side effects. Has been stopped for about one month symptoms some improved. Asking for something for nausea and heartburn. Per dr Laural Golden. Zofran 4mg  one tid #20 and may take otc acid reducer in the morning. And continue protonix 40mg  at night. Prt verbalized understanding and zofran was sent into pharm yesterday for pt.

## 2021-01-19 NOTE — Telephone Encounter (Signed)
Pt wants to know if she needs bw for her appt in jan. She would like to do this bw in December at [facility] when she does her bw for dr hall. States dr Laural Golden usually wants a H &H

## 2021-01-23 ENCOUNTER — Telehealth: Payer: Self-pay | Admitting: Neurology

## 2021-01-23 NOTE — Telephone Encounter (Signed)
Received the overnight oximetry test that was completed while the patient is on CPAP and 2 L of oxygen.  5 hr and 58 min was recorded and out of that time, 64min 33sec, her oxygen was spent below 88%.   Spoke with Dr Brett Fairy and she reviewed the report. Given there is concern of oxygen still being low, Dr Dohmeier feels may be beneficial for the patient to be referred to pulmonology (if she doesn't already have one). Will inform the patient and place a referral if needed.

## 2021-01-23 NOTE — Telephone Encounter (Signed)
Called the patient to discuss the results. There was no answer. LVM advised I would also send a mychart message to the pt.

## 2021-01-24 DIAGNOSIS — M48061 Spinal stenosis, lumbar region without neurogenic claudication: Secondary | ICD-10-CM | POA: Diagnosis not present

## 2021-01-25 ENCOUNTER — Encounter: Payer: Self-pay | Admitting: Neurology

## 2021-01-29 ENCOUNTER — Telehealth: Payer: Self-pay | Admitting: Neurology

## 2021-01-29 DIAGNOSIS — M48061 Spinal stenosis, lumbar region without neurogenic claudication: Secondary | ICD-10-CM | POA: Diagnosis not present

## 2021-01-29 NOTE — Telephone Encounter (Signed)
I have forwarded this information to her pcp.

## 2021-01-29 NOTE — Telephone Encounter (Signed)
Pt is asking for the test results to be sent to Dr [name]

## 2021-02-01 DIAGNOSIS — M48061 Spinal stenosis, lumbar region without neurogenic claudication: Secondary | ICD-10-CM | POA: Diagnosis not present

## 2021-02-02 DIAGNOSIS — E119 Type 2 diabetes mellitus without complications: Secondary | ICD-10-CM | POA: Diagnosis not present

## 2021-02-07 DIAGNOSIS — M48061 Spinal stenosis, lumbar region without neurogenic claudication: Secondary | ICD-10-CM | POA: Diagnosis not present

## 2021-02-07 NOTE — Telephone Encounter (Signed)
Error

## 2021-02-08 ENCOUNTER — Encounter: Payer: Self-pay | Admitting: Neurology

## 2021-02-10 ENCOUNTER — Encounter (INDEPENDENT_AMBULATORY_CARE_PROVIDER_SITE_OTHER): Payer: Self-pay

## 2021-02-12 DIAGNOSIS — E119 Type 2 diabetes mellitus without complications: Secondary | ICD-10-CM | POA: Diagnosis not present

## 2021-02-12 DIAGNOSIS — M48061 Spinal stenosis, lumbar region without neurogenic claudication: Secondary | ICD-10-CM | POA: Diagnosis present

## 2021-02-12 DIAGNOSIS — M48062 Spinal stenosis, lumbar region with neurogenic claudication: Secondary | ICD-10-CM | POA: Diagnosis not present

## 2021-02-13 DIAGNOSIS — Z7689 Persons encountering health services in other specified circumstances: Secondary | ICD-10-CM | POA: Diagnosis not present

## 2021-02-14 ENCOUNTER — Other Ambulatory Visit (INDEPENDENT_AMBULATORY_CARE_PROVIDER_SITE_OTHER): Payer: Self-pay | Admitting: *Deleted

## 2021-02-14 DIAGNOSIS — D649 Anemia, unspecified: Secondary | ICD-10-CM

## 2021-02-14 NOTE — Telephone Encounter (Signed)
Per dr Laural Golden - order cbc with diff. I put in order for [facility] and called pt. She would like mailed to her. Orders placed in the mail.

## 2021-02-15 ENCOUNTER — Telehealth (INDEPENDENT_AMBULATORY_CARE_PROVIDER_SITE_OTHER): Payer: Medicare Other | Admitting: Psychiatry

## 2021-02-15 ENCOUNTER — Other Ambulatory Visit: Payer: Self-pay

## 2021-02-15 ENCOUNTER — Encounter (HOSPITAL_COMMUNITY): Payer: Self-pay | Admitting: Psychiatry

## 2021-02-15 DIAGNOSIS — F331 Major depressive disorder, recurrent, moderate: Secondary | ICD-10-CM | POA: Diagnosis not present

## 2021-02-15 MED ORDER — BUPROPION HCL ER (XL) 150 MG PO TB24: 150.0000 mg | ORAL_TABLET | Freq: Every morning | ORAL | 2 refills | Status: DC

## 2021-02-15 MED ORDER — VENLAFAXINE HCL ER 150 MG PO CP24: 300.0000 mg | ORAL_CAPSULE | Freq: Every day | ORAL | 2 refills | Status: DC

## 2021-02-15 MED ORDER — ALPRAZOLAM 0.25 MG PO TABS: 0.2500 mg | ORAL_TABLET | Freq: Every day | ORAL | 2 refills | Status: DC | PRN

## 2021-02-15 MED ORDER — LAMOTRIGINE 150 MG PO TABS: 150.0000 mg | ORAL_TABLET | Freq: Every day | ORAL | 2 refills | Status: DC

## 2021-02-15 NOTE — Progress Notes (Signed)
Virtual Visit via Telephone Note  I connected with Norma [name] on 02/15/21 at  1:00 PM EDT by telephone and verified that I am speaking with the correct person using two identifiers.  Location: Patient: home Provider: office   I discussed the limitations, risks, security and privacy concerns of performing an evaluation and management service by telephone and the availability of in person appointments. I also discussed with the patient that there may be a patient responsible charge related to this service. The patient expressed understanding and agreed to proceed.       I discussed the assessment and treatment plan with the patient. The patient was provided an opportunity to ask questions and all were answered. The patient agreed with the plan and demonstrated an understanding of the instructions.   The patient was advised to call back or seek an in-person evaluation if the symptoms worsen or if the condition fails to improve as anticipated.  I provided 15 minutes of non-face-to-face time during this encounter.   [name], MD  [facility] MD/PA/NP OP Progress Note  02/15/2021 1:18 PM Norma [name]  MRN:  [id number]  Chief Complaint:  Chief Complaint   Depression; Anxiety; Follow-up    HPI: This patient is a 70 year old single white female who lives alone in [place].  She is no longer working.  The patient returns for follow-up after 3 months regarding her depression and anxiety.  She states that she had been depressed few weeks ago because she was having a lot of lower back pain and decreased mobility.  She got an injection earlier this week and is feeling much better.  She has gotten out more.  She denies significant depression or anxiety.  In fact she is rarely using the Xanax.  She is no longer having the twitching.  She denies any thoughts of self-harm or suicidal ideation. Visit Diagnosis:    ICD-10-CM   1. Moderate episode of recurrent major depressive disorder ([place])  F33.1        Past Psychiatric History: Patient was hospitalized in 2001 and went through a course of ECT.  She has been tried on Effexor Wellbutrin and Zyprexa  Past Medical History:  Past Medical History:  Diagnosis Date   Anemia    Anxiety    Bipolar disorder ([place])    Bulging lumbar disc    L3-4   Chronic daily headache    Chronic low back pain 09/20/2014   COPD (chronic obstructive pulmonary disease) (HCC)    Degenerative arthritis    Depression    Diabetes mellitus, type II ([place])    Diabetic neuropathy ([place]) 09/16/2018   DM type 2 with diabetic peripheral neuropathy ([place]) 09/20/2014   Dyslipidemia    Dyspnea    Gastroparesis    GERD (gastroesophageal reflux disease)    Heart murmur    History of hiatal hernia    Hypothyroidism    IBS (irritable bowel syndrome)    Memory difficulty 09/20/2014   Morbid obesity ([place])    Neuropathy    Obstructive sleep apnea on CPAP    Restless legs syndrome (RLS) 09/17/2012   Stroke (cerebrum) ([place]) 05/16/2017   Left parietal   Urticaria     Past Surgical History:  Procedure Laterality Date   ABDOMINAL HYSTERECTOMY     ARTHROSCOPY KNEE W/ DRILLING  06/2011   and Dece[place] of 2013.   Carpel tunnel  1980's   CATARACT EXTRACTION W/PHACO Right 03/21/2017   Procedure: CATARACT EXTRACTION PHACO AND INTRAOCULAR LENS PLACEMENT  RIGHT EYE;  Surgeon: Baruch Goldmann, MD;  Location: [facility];  Service: Ophthalmology;  Laterality: Right;  CDE: 2.91    CATARACT EXTRACTION W/PHACO Left 04/18/2017   Procedure: CATARACT EXTRACTION PHACO AND INTRAOCULAR LENS PLACEMENT LEFT EYE;  Surgeon: Baruch Goldmann, MD;  Location: [facility];  Service: Ophthalmology;  Laterality: Left;  left   CHOLECYSTECTOMY     COLONOSCOPY WITH PROPOFOL N/A 10/04/2016   Procedure: COLONOSCOPY WITH PROPOFOL;  Surgeon: Rogene Houston, MD;  [facility];  Service: Endoscopy;  Laterality: N/A;  11:10   CORONARY ARTERY BYPASS GRAFT N/A 08/29/2017   Procedure: CORONARY ARTERY BYPASS GRAFTING (CABG) x  3 WITH ENDOSCOPIC HARVESTING OF RIGHT SAPHENOUS VEIN;  Surgeon: Ivin Poot, MD;  Location: [place];  Service: Open Heart Surgery;  Laterality: N/A;   ESOPHAGOGASTRODUODENOSCOPY N/A 04/25/2016   Procedure: ESOPHAGOGASTRODUODENOSCOPY (EGD);  Surgeon: Rogene Houston, MD;  [facility];  Service: Endoscopy;  Laterality: N/A;  730   LEFT HEART CATH AND CORONARY ANGIOGRAPHY N/A 08/27/2017   Procedure: LEFT HEART CATH AND CORONARY ANGIOGRAPHY;  Surgeon: Sherren Mocha, MD;  Location: [facility] [facility];  Service: Cardiovascular::  pLAD 95% - p-mLAD 50%, ostD1 90% -pD1 80%; ostOM1 90%; rPDA 80%. EF ~50-55% - HK of dital Anterolateral & Apical wall.  - Rec CVTS c/s   OPEN REDUCTION INTERNAL FIXATION (ORIF) HAND Left 04/10/2020   Procedure: OPEN REDUCTION INTERNAL FIXATION (ORIF) HAND, left index finger;  Surgeon: Carole Civil, MD;  Location: [facility];  Service: Orthopedics;  Laterality: Left;  0.045 k wires   RECTAL SURGERY     fissure   SHOULDER SURGERY Left    arthroscopy in March of this year   TEE WITHOUT CARDIOVERSION N/A 08/29/2017   Procedure: TRANSESOPHAGEAL ECHOCARDIOGRAM (TEE);  Surgeon: Prescott Gum, Collier Salina, MD;  Location: [facility];  Service: Open Heart Surgery;  Laterality: N/A;   TRANSTHORACIC ECHOCARDIOGRAM  06/06/2017   Mild to moderate reduced EF 40 and 45%.  Anterior septal, inferoseptal and basal to mid inferior hypokinesis.  GR 1 DD.  No significant valvular lesion    Family Psychiatric History: see below  Family History:  Family History  Problem Relation Age of Onset   Hypertension Mother    Lymphoma Mother    Depression Mother    Arthritis Father    Alcohol abuse Father    Hypertension Sister    Cancer Brother        kidney and lung   Alcohol abuse Brother    Alcohol abuse Paternal Uncle    Alcohol abuse Paternal Grandfather    Alcohol abuse Paternal Grandmother    Allergic rhinitis Neg Hx    Angioedema Neg Hx    Asthma Neg Hx    Atopy Neg Hx    Eczema  Neg Hx    Immunodeficiency Neg Hx    Urticaria Neg Hx     Social History:  Social History   Socioeconomic History   Marital status: Single    Spouse name: Not on file   Number of children: 0   Years of education: 16   Highest education level: Not on file  Occupational History    Employer: [employer]  Tobacco Use   Smoking status: Former    Types: Cigarettes    Quit date: 02/04/1971    Years since quitting: 50.0   Smokeless tobacco: Never   Tobacco comments:    smoked 2 cigarettes a day  Vaping Use   Vaping  Use: Never used  Substance and Sexual Activity   Alcohol use: No    Alcohol/week: 0.0 standard drinks   Drug use: No   Sexual activity: Never  Other Topics Concern   Not on file  Social History Narrative   Patient lives at home alone.    Patient has no children.    Patient has her masters in nursing.    Patient is single.    Patient drinks about 2 glasses of tea daily.   Patient is right handed.   Social Determinants of Health   Financial Resource Strain: Not on file  Food Insecurity: Not on file  Transportation Needs: Not on file  Physical Activity: Not on file  Stress: Not on file  Social Connections: Not on file    Allergies:  Allergies  Allergen Reactions   Amoxicillin Anaphylaxis and Other (See Comments)    Has patient had a PCN reaction causing immediate rash, facial/tongue/throat swelling, SOB or lightheadedness with hypotension: Yes Has patient had a PCN reaction causing severe rash involving mucus membranes or skin necrosis: Yes Has patient had a PCN reaction that required hospitalization: Yes Has patient had a PCN reaction occurring within the last 10 years: Yes If all of the above answers are "NO", then may proceed with Cephalosporin use.    Hydrocodone Anaphylaxis   Percocet [Oxycodone-Acetaminophen] Other (See Comments)    Causes chest pain /tightness. Pressure pain around her ribs.   Vancomycin Itching   Depacon [Valproic  Acid] Other (See Comments)    Causes falls    Depakote [Divalproex Sodium]     Metabolic Disorder Labs: Lab Results  Component Value Date   HGBA1C 6.6 11/08/2019   MPG 148 03/05/2019   MPG 136.98 10/30/2018   No results found for: PROLACTIN Lab Results  Component Value Date   CHOL 129 11/08/2019   TRIG 158 11/08/2019   HDL 60 11/08/2019   CHOLHDL 3.8 03/05/2019   VLDL 31 (H) 07/10/2016   LDLCALC 43 11/08/2019   LDLCALC 152 (H) 03/05/2019   Lab Results  Component Value Date   TSH 1.49 03/05/2019   TSH 2.671 10/30/2018    Therapeutic Level Labs: No results found for: LITHIUM No results found for: VALPROATE No components found for:  CBMZ  Current Medications: Current Outpatient Medications  Medication Sig Dispense Refill   ALPRAZolam (XANAX) 0.25 MG tablet Take 1 tablet (0.25 mg total) by mouth daily as needed for anxiety. 30 tablet 2   aspirin EC 81 MG tablet Take 1 tablet (81 mg total) by mouth daily. 90 tablet 3   baclofen (LIORESAL) 10 MG tablet Take 1 tablet (10 mg total) by mouth at bedtime. 90 tablet 3   buPROPion (WELLBUTRIN XL) 150 MG 24 hr tablet Take 1 tablet (150 mg total) by mouth every morning. 30 tablet 2   calcium carbonate (TUMS - DOSED IN MG ELEMENTAL CALCIUM) 500 MG chewable tablet Chew 2 tablets by mouth as needed for indigestion or heartburn.      carvedilol (COREG) 6.25 MG tablet TAKE ONE TABLET BY MOUTH TWICE DAILY (MORNING ,EVENING) 180 tablet 2   cetirizine (ZYRTEC) 10 MG tablet Take 1 tablet tablet 1-2 times daily. (Patient taking differently: Take by mouth at bedtime.) 60 tablet 5   diclofenac Sodium (VOLTAREN) 1 % GEL Apply topically 4 (four) times daily.     diphenhydrAMINE (BENADRYL) 25 mg capsule Take 25 mg by mouth as needed.     empagliflozin (JARDIANCE) 10 MG TABS tablet Take 10 mg  by mouth daily.     EPINEPHrine 0.3 mg/0.3 mL IJ SOAJ injection Inject 0.3 mg into the muscle once.     ferrous sulfate 325 (65 FE) MG tablet Take 325 mg by  mouth daily after supper.     gabapentin (NEURONTIN) 400 MG capsule One capsule in the morning and 2 in the evening 270 capsule 3   glipiZIDE (GLUCOTROL XL) 5 MG 24 hr tablet TAKE ONE TABLET BY MOUTH DAILY WITH BREAKFAST 90 tablet 0   lamoTRIgine (LAMICTAL) 150 MG tablet Take 1 tablet (150 mg total) by mouth at bedtime. 28 tablet 2   levothyroxine (SYNTHROID) 112 MCG tablet Take 112 mcg by mouth daily before breakfast.     losartan (COZAAR) 25 MG tablet TAKE ONE TABLET BY MOUTH DAILY (MORNING) 90 tablet 2   meclizine (ANTIVERT) 25 MG tablet Take 1 tablet (25 mg total) by mouth 3 (three) times daily as needed for dizziness. (Patient taking differently: Take 25 mg by mouth daily as needed for dizziness.) 30 tablet 0   metFORMIN (GLUCOPHAGE) 500 MG tablet TAKE ONE TABLET BY MOUTH TWICE DAILY WITH A MEAL (MORNING ,EVENING) 180 tablet 0   ondansetron (ZOFRAN) 4 MG tablet Take 1 tablet (4 mg total) by mouth every 8 (eight) hours as needed for nausea or vomiting. 20 tablet 0   pantoprazole (PROTONIX) 40 MG tablet Take 1 tablet (40 mg total) by mouth every evening. 90 tablet 3   REPATHA SURECLICK 051 MG/ML SOAJ INJECT ONE DOSE INTO SKIN EVERY 14 DAYS. 2 mL 11   rOPINIRole (REQUIP) 3 MG tablet TAKE ONE TABLET BY MOUTH EVERY MORNING, ,NOON AND AT BEDTIME. (PRT PT MORNING,EVENING,BEDTIME) 270 tablet 3   torsemide (DEMADEX) 20 MG tablet Take 2 tablets (40 mg total) by mouth daily. 180 tablet 3   traMADol (ULTRAM) 50 MG tablet Take 100 mg by mouth 4 (four) times daily as needed.      venlafaxine XR (EFFEXOR-XR) 150 MG 24 hr capsule Take 2 capsules (300 mg total) by mouth at bedtime. 60 capsule 2   Vitamin D, Ergocalciferol, (DRISDOL) 1.25 MG (50000 UNIT) CAPS capsule Take 50,000 Units by mouth every 7 (seven) days.     No current facility-administered medications for this visit.     Musculoskeletal: Strength & Muscle Tone: na Gait & Station: na Patient leans: N/A  Psychiatric Specialty Exam: Review of  Systems  Musculoskeletal:  Positive for back pain and gait problem.  All other systems reviewed and are negative.  There were no vitals taken for this visit.There is no height or weight on file to calculate BMI.  General Appearance: NA  Eye Contact:  NA  Speech:  Clear and Coherent  Volume:  Normal  Mood:  Euthymic  Affect:  NA  Thought Process:  Goal Directed  Orientation:  Full (Time, Place, and Person)  Thought Content: Rumination   Suicidal Thoughts:  No  Homicidal Thoughts:  No  Memory:  Immediate;   Good Recent;   Good Remote;   Fair  Judgement:  Good  Insight:  Good  Psychomotor Activity:  Decreased  Concentration:  Concentration: Good and Attention Span: Good  Recall:  Good  Fund of Knowledge: Good  Language: Good  Akathisia:  No  Handed:  Right  AIMS (if indicated): not done  Assets:  Communication Skills Desire for Improvement Resilience Social Support Talents/Skills  ADL's:  Intact  Cognition: WNL  Sleep:  Good   Screenings: Mini-Mental    Flowsheet Row Office Visit from  02/18/2018 in [facility] Office Visit from 09/17/2017 in [facility]  Total Score (max 30 points ) 28 28      PHQ2-9    Flowsheet Row Video Visit from 02/15/2021 in [facility] Video Visit from 11/16/2020 in [facility] Video Visit from 08/03/2020 in [facility] Counselor from 05/17/2019 in [facility] [place] Office Visit from 09/11/2017 in [facility]  PHQ-2 Total Score 0 0 0 1 0      Flowsheet Row Video Visit from 02/15/2021 in [facility] Video Visit from 11/16/2020 in [facility] Video Visit from 08/03/2020 in [facility] No  Risk No Risk No Risk        Assessment and Plan: This patient is a 70 year old female with a history of depression anxiety mood swings and insomnia.  She continues to do well on her current regimen.  She will continue Wellbutrin XL 150 mg daily as well as Effexor XR 300 mg daily for depression, Lamictal 100 mg daily for mood stabilization and Xanax 0.25 mg once daily as needed for anxiety.  She will return to see me in 3 months   [name], MD 02/15/2021, 1:18 PM

## 2021-02-21 ENCOUNTER — Telehealth: Payer: Self-pay | Admitting: Neurology

## 2021-02-21 NOTE — Telephone Encounter (Signed)
Pt called states the text she received was a scam and to disregard her call.

## 2021-02-21 NOTE — Telephone Encounter (Signed)
Pt states she received a text about her Bi-pap application being incomplete

## 2021-02-21 NOTE — Telephone Encounter (Signed)
Called the patient back.  There was no answer.  Left a detailed message advising the patient that left with our phone staff did not make sense as we do not do anything related to an application.  Advised that we only send orders and order was sent to the company [employer].  Advised the patient to contact company to find out if there is anything else needed from her.  Provided the phone number to [employer].  **If patient returns call, redirect to the DME company or if she has anything else to add please get further information.

## 2021-02-26 ENCOUNTER — Encounter (INDEPENDENT_AMBULATORY_CARE_PROVIDER_SITE_OTHER): Payer: Self-pay | Admitting: Gastroenterology

## 2021-02-28 DIAGNOSIS — Z6841 Body Mass Index (BMI) 40.0 and over, adult: Secondary | ICD-10-CM | POA: Diagnosis not present

## 2021-02-28 DIAGNOSIS — M48062 Spinal stenosis, lumbar region with neurogenic claudication: Secondary | ICD-10-CM | POA: Diagnosis not present

## 2021-02-28 DIAGNOSIS — I10 Essential (primary) hypertension: Secondary | ICD-10-CM | POA: Diagnosis not present

## 2021-03-14 DIAGNOSIS — I10 Essential (primary) hypertension: Secondary | ICD-10-CM | POA: Diagnosis not present

## 2021-03-14 DIAGNOSIS — E782 Mixed hyperlipidemia: Secondary | ICD-10-CM | POA: Diagnosis not present

## 2021-03-14 DIAGNOSIS — E119 Type 2 diabetes mellitus without complications: Secondary | ICD-10-CM | POA: Diagnosis not present

## 2021-03-15 ENCOUNTER — Other Ambulatory Visit: Payer: Self-pay | Admitting: Cardiology

## 2021-03-15 ENCOUNTER — Institutional Professional Consult (permissible substitution): Payer: Medicare Other | Admitting: Pulmonary Disease

## 2021-03-28 ENCOUNTER — Other Ambulatory Visit: Payer: Self-pay

## 2021-03-28 ENCOUNTER — Encounter: Payer: Self-pay | Admitting: Orthopedic Surgery

## 2021-03-28 ENCOUNTER — Ambulatory Visit (INDEPENDENT_AMBULATORY_CARE_PROVIDER_SITE_OTHER): Payer: Medicare Other | Admitting: Orthopedic Surgery

## 2021-03-28 DIAGNOSIS — M25561 Pain in right knee: Secondary | ICD-10-CM | POA: Diagnosis not present

## 2021-03-28 DIAGNOSIS — I5032 Chronic diastolic (congestive) heart failure: Secondary | ICD-10-CM | POA: Diagnosis not present

## 2021-03-28 DIAGNOSIS — E1121 Type 2 diabetes mellitus with diabetic nephropathy: Secondary | ICD-10-CM | POA: Diagnosis not present

## 2021-03-28 DIAGNOSIS — G8929 Other chronic pain: Secondary | ICD-10-CM

## 2021-03-28 DIAGNOSIS — D509 Iron deficiency anemia, unspecified: Secondary | ICD-10-CM | POA: Diagnosis not present

## 2021-03-28 DIAGNOSIS — E039 Hypothyroidism, unspecified: Secondary | ICD-10-CM | POA: Diagnosis not present

## 2021-03-28 NOTE — Progress Notes (Signed)
Chief Complaint  Patient presents with   Knee Pain    Right/ wants injection     Procedure note right knee injection   verbal consent was obtained to inject right knee joint  Timeout was completed to confirm the site of injection  The medications used were Depo-Medrol 40 mg with 1% lidocaine 3 cc anesthesia was provided by ethyl chloride and the skin was prepped with alcohol.  After cleaning the skin with alcohol a 20-gauge needle was used to inject the right knee joint. There were no complications. A sterile bandage was applied.

## 2021-03-28 NOTE — Patient Instructions (Signed)

## 2021-03-30 DIAGNOSIS — E039 Hypothyroidism, unspecified: Secondary | ICD-10-CM | POA: Diagnosis not present

## 2021-03-30 DIAGNOSIS — I5032 Chronic diastolic (congestive) heart failure: Secondary | ICD-10-CM | POA: Diagnosis not present

## 2021-03-30 DIAGNOSIS — E1121 Type 2 diabetes mellitus with diabetic nephropathy: Secondary | ICD-10-CM | POA: Diagnosis not present

## 2021-03-30 DIAGNOSIS — I2510 Atherosclerotic heart disease of native coronary artery without angina pectoris: Secondary | ICD-10-CM | POA: Diagnosis not present

## 2021-03-30 DIAGNOSIS — M545 Low back pain, unspecified: Secondary | ICD-10-CM | POA: Diagnosis not present

## 2021-03-30 DIAGNOSIS — E782 Mixed hyperlipidemia: Secondary | ICD-10-CM | POA: Diagnosis not present

## 2021-03-30 DIAGNOSIS — G4733 Obstructive sleep apnea (adult) (pediatric): Secondary | ICD-10-CM | POA: Diagnosis not present

## 2021-03-30 DIAGNOSIS — D509 Iron deficiency anemia, unspecified: Secondary | ICD-10-CM | POA: Diagnosis not present

## 2021-03-30 DIAGNOSIS — Z8673 Personal history of transient ischemic attack (TIA), and cerebral infarction without residual deficits: Secondary | ICD-10-CM | POA: Diagnosis not present

## 2021-03-30 DIAGNOSIS — N1832 Chronic kidney disease, stage 3b: Secondary | ICD-10-CM | POA: Diagnosis not present

## 2021-03-30 DIAGNOSIS — E1140 Type 2 diabetes mellitus with diabetic neuropathy, unspecified: Secondary | ICD-10-CM | POA: Diagnosis not present

## 2021-03-30 DIAGNOSIS — F3181 Bipolar II disorder: Secondary | ICD-10-CM | POA: Diagnosis not present

## 2021-04-02 ENCOUNTER — Other Ambulatory Visit (HOSPITAL_COMMUNITY): Payer: Self-pay | Admitting: Internal Medicine

## 2021-04-02 ENCOUNTER — Other Ambulatory Visit: Payer: Self-pay | Admitting: Internal Medicine

## 2021-04-02 DIAGNOSIS — N1832 Chronic kidney disease, stage 3b: Secondary | ICD-10-CM

## 2021-04-10 ENCOUNTER — Other Ambulatory Visit (HOSPITAL_COMMUNITY): Payer: Self-pay | Admitting: Psychiatry

## 2021-04-10 ENCOUNTER — Ambulatory Visit (HOSPITAL_COMMUNITY): Payer: Medicare Other

## 2021-04-10 ENCOUNTER — Encounter (HOSPITAL_COMMUNITY): Payer: Self-pay

## 2021-04-13 ENCOUNTER — Ambulatory Visit (HOSPITAL_COMMUNITY): Payer: Medicare Other

## 2021-04-14 DIAGNOSIS — I10 Essential (primary) hypertension: Secondary | ICD-10-CM | POA: Diagnosis not present

## 2021-04-14 DIAGNOSIS — E119 Type 2 diabetes mellitus without complications: Secondary | ICD-10-CM | POA: Diagnosis not present

## 2021-04-20 ENCOUNTER — Ambulatory Visit (HOSPITAL_COMMUNITY)
Admission: RE | Admit: 2021-04-20 | Discharge: 2021-04-20 | Disposition: A | Discharge status: Home / Self Care | Payer: Medicare Other | Source: Ambulatory Visit | Attending: Internal Medicine | Admitting: Internal Medicine

## 2021-04-20 ENCOUNTER — Other Ambulatory Visit: Payer: Self-pay

## 2021-04-20 DIAGNOSIS — D72829 Elevated white blood cell count, unspecified: Secondary | ICD-10-CM

## 2021-04-20 DIAGNOSIS — E876 Hypokalemia: Secondary | ICD-10-CM

## 2021-04-20 DIAGNOSIS — I5032 Chronic diastolic (congestive) heart failure: Secondary | ICD-10-CM | POA: Diagnosis not present

## 2021-04-20 DIAGNOSIS — N1832 Chronic kidney disease, stage 3b: Secondary | ICD-10-CM | POA: Diagnosis not present

## 2021-04-20 DIAGNOSIS — N183 Chronic kidney disease, stage 3 unspecified: Secondary | ICD-10-CM | POA: Diagnosis not present

## 2021-04-20 DIAGNOSIS — N200 Calculus of kidney: Secondary | ICD-10-CM | POA: Diagnosis not present

## 2021-04-30 DIAGNOSIS — N189 Chronic kidney disease, unspecified: Secondary | ICD-10-CM | POA: Diagnosis not present

## 2021-04-30 DIAGNOSIS — I2510 Atherosclerotic heart disease of native coronary artery without angina pectoris: Secondary | ICD-10-CM | POA: Diagnosis not present

## 2021-04-30 DIAGNOSIS — R0602 Shortness of breath: Secondary | ICD-10-CM | POA: Diagnosis not present

## 2021-04-30 DIAGNOSIS — G4733 Obstructive sleep apnea (adult) (pediatric): Secondary | ICD-10-CM | POA: Diagnosis not present

## 2021-04-30 DIAGNOSIS — E1122 Type 2 diabetes mellitus with diabetic chronic kidney disease: Secondary | ICD-10-CM | POA: Diagnosis not present

## 2021-04-30 DIAGNOSIS — N200 Calculus of kidney: Secondary | ICD-10-CM | POA: Diagnosis not present

## 2021-04-30 DIAGNOSIS — E611 Iron deficiency: Secondary | ICD-10-CM | POA: Diagnosis not present

## 2021-04-30 DIAGNOSIS — E559 Vitamin D deficiency, unspecified: Secondary | ICD-10-CM | POA: Diagnosis not present

## 2021-04-30 DIAGNOSIS — I129 Hypertensive chronic kidney disease with stage 1 through stage 4 chronic kidney disease, or unspecified chronic kidney disease: Secondary | ICD-10-CM | POA: Diagnosis not present

## 2021-04-30 DIAGNOSIS — K760 Fatty (change of) liver, not elsewhere classified: Secondary | ICD-10-CM | POA: Diagnosis not present

## 2021-04-30 DIAGNOSIS — I5042 Chronic combined systolic (congestive) and diastolic (congestive) heart failure: Secondary | ICD-10-CM | POA: Diagnosis not present

## 2021-04-30 DIAGNOSIS — R809 Proteinuria, unspecified: Secondary | ICD-10-CM | POA: Diagnosis not present

## 2021-05-03 ENCOUNTER — Other Ambulatory Visit: Payer: Self-pay

## 2021-05-03 ENCOUNTER — Other Ambulatory Visit (HOSPITAL_COMMUNITY): Payer: Self-pay | Admitting: Nephrology

## 2021-05-03 ENCOUNTER — Ambulatory Visit (HOSPITAL_COMMUNITY)
Admission: RE | Admit: 2021-05-03 | Discharge: 2021-05-03 | Disposition: A | Discharge status: Home / Self Care | Payer: Medicare Other | Source: Ambulatory Visit | Attending: Nephrology | Admitting: Nephrology

## 2021-05-03 DIAGNOSIS — I509 Heart failure, unspecified: Secondary | ICD-10-CM | POA: Diagnosis not present

## 2021-05-03 DIAGNOSIS — I5042 Chronic combined systolic (congestive) and diastolic (congestive) heart failure: Secondary | ICD-10-CM | POA: Diagnosis not present

## 2021-05-07 ENCOUNTER — Other Ambulatory Visit: Payer: Self-pay

## 2021-05-07 ENCOUNTER — Ambulatory Visit (INDEPENDENT_AMBULATORY_CARE_PROVIDER_SITE_OTHER): Payer: Medicare Other | Admitting: Cardiology

## 2021-05-07 ENCOUNTER — Encounter: Payer: Self-pay | Admitting: Cardiology

## 2021-05-07 VITALS — BP 116/68 | HR 88 | Ht 60.0 in | Wt 235.4 lb

## 2021-05-07 DIAGNOSIS — I129 Hypertensive chronic kidney disease with stage 1 through stage 4 chronic kidney disease, or unspecified chronic kidney disease: Secondary | ICD-10-CM | POA: Diagnosis not present

## 2021-05-07 DIAGNOSIS — E1122 Type 2 diabetes mellitus with diabetic chronic kidney disease: Secondary | ICD-10-CM | POA: Diagnosis not present

## 2021-05-07 DIAGNOSIS — N189 Chronic kidney disease, unspecified: Secondary | ICD-10-CM | POA: Diagnosis not present

## 2021-05-07 DIAGNOSIS — K760 Fatty (change of) liver, not elsewhere classified: Secondary | ICD-10-CM | POA: Diagnosis not present

## 2021-05-07 DIAGNOSIS — N200 Calculus of kidney: Secondary | ICD-10-CM | POA: Diagnosis not present

## 2021-05-07 DIAGNOSIS — E1129 Type 2 diabetes mellitus with other diabetic kidney complication: Secondary | ICD-10-CM | POA: Diagnosis not present

## 2021-05-07 DIAGNOSIS — I5032 Chronic diastolic (congestive) heart failure: Secondary | ICD-10-CM | POA: Diagnosis not present

## 2021-05-07 DIAGNOSIS — I5042 Chronic combined systolic (congestive) and diastolic (congestive) heart failure: Secondary | ICD-10-CM | POA: Diagnosis not present

## 2021-05-07 DIAGNOSIS — R809 Proteinuria, unspecified: Secondary | ICD-10-CM | POA: Diagnosis not present

## 2021-05-07 DIAGNOSIS — I2510 Atherosclerotic heart disease of native coronary artery without angina pectoris: Secondary | ICD-10-CM | POA: Diagnosis not present

## 2021-05-07 DIAGNOSIS — E782 Mixed hyperlipidemia: Secondary | ICD-10-CM

## 2021-05-07 DIAGNOSIS — E611 Iron deficiency: Secondary | ICD-10-CM | POA: Diagnosis not present

## 2021-05-07 DIAGNOSIS — G4733 Obstructive sleep apnea (adult) (pediatric): Secondary | ICD-10-CM | POA: Diagnosis not present

## 2021-05-07 NOTE — Progress Notes (Signed)
Clinical Summary Norma [name] is a 71 y.o.female seen today for follow up of the following medical problems.    1. Chronic diastolic HF 18/5631 echo VLEF 49-70%, grade I diastolic dysfunction.  -05/6376 echo LVEF 50-55%  - taking torsemide prn, mild uptrend in Cr from 1.2 to 1.4 by last labs - intermittent leg edema and SOB, responds well to prn torsemide.      -reports some recent increased edema. Takes torsemide prn, has not needed until recently. Hard to take consistently on days she goes out due to frequent urination.  - DOE from parking lot into office.   - back to taking torsemide just prn, takes 1-2 per month. Limiting due renal function decline.      2. CAD - 05/2017 echo LVEF 40-45% - 08/2017 cath for unstable angina: LAD 95%, D1 90%, RPDA 80%, OM 90%.  - CT surgery was consulted. 08/30/17 CABG with sequential SVG to diag and LAD, SVG-OM.   -  stopped lopressor due to fatigue.    - denies any recent chest pains.    3. Chronic LBBB     4. Hyperlipidemia -intolerant to statins - followed in lipid clinic.   - she is on repatha, LDL 152-->43 on repatha.   - compliant with meds     5. OSA - on cpap, followed by Dr Dohmeier  6. CKD 3b - new appt last week with Dr Theador Hawthorne -    Past Medical History:  Diagnosis Date   Anemia    Anxiety    Bipolar disorder ([place])    Bulging lumbar disc    L3-4   Chronic daily headache    Chronic low back pain 09/20/2014   COPD (chronic obstructive pulmonary disease) (HCC)    Degenerative arthritis    Depression    Diabetes mellitus, type II ([place])    Diabetic neuropathy ([place]) 09/16/2018   DM type 2 with diabetic peripheral neuropathy ([place]) 09/20/2014   Dyslipidemia    Dyspnea    Gastroparesis    GERD (gastroesophageal reflux disease)    Heart murmur    History of hiatal hernia    Hypothyroidism    IBS (irritable bowel syndrome)    Memory difficulty 09/20/2014   Morbid obesity ([place])    Neuropathy    Obstructive sleep apnea on  CPAP    Restless legs syndrome (RLS) 09/17/2012   Stroke (cerebrum) ([place]) 05/16/2017   Left parietal   Urticaria      Allergies  Allergen Reactions   Amoxicillin Anaphylaxis and Other (See Comments)    Has patient had a PCN reaction causing immediate rash, facial/tongue/throat swelling, SOB or lightheadedness with hypotension: Yes Has patient had a PCN reaction causing severe rash involving mucus membranes or skin necrosis: Yes Has patient had a PCN reaction that required hospitalization: Yes Has patient had a PCN reaction occurring within the last 10 years: Yes If all of the above answers are "NO", then may proceed with Cephalosporin use.    Hydrocodone Anaphylaxis   Percocet [Oxycodone-Acetaminophen] Other (See Comments)    Causes chest pain /tightness. Pressure pain around her ribs.   Vancomycin Itching   Depacon [Valproic Acid] Other (See Comments)    Causes falls    Depakote [Divalproex Sodium]      Current Outpatient Medications  Medication Sig Dispense Refill   ALPRAZolam (XANAX) 0.25 MG tablet Take 1 tablet (0.25 mg total) by mouth daily as needed for anxiety. 30 tablet 2   aspirin EC 81  MG tablet Take 1 tablet (81 mg total) by mouth daily. 90 tablet 3   buPROPion (WELLBUTRIN XL) 150 MG 24 hr tablet Take 1 tablet (150 mg total) by mouth every morning. 30 tablet 2   calcium carbonate (TUMS - DOSED IN MG ELEMENTAL CALCIUM) 500 MG chewable tablet Chew 2 tablets by mouth as needed for indigestion or heartburn.      carvedilol (COREG) 6.25 MG tablet TAKE ONE TABLET BY MOUTH TWICE DAILY (MORNING,EVENING) 180 tablet 2   cetirizine (ZYRTEC) 10 MG tablet Take 1 tablet tablet 1-2 times daily. (Patient taking differently: Take by mouth at bedtime.) 60 tablet 5   diclofenac Sodium (VOLTAREN) 1 % GEL Apply topically 4 (four) times daily.     diphenhydrAMINE (BENADRYL) 25 mg capsule Take 25 mg by mouth as needed.     empagliflozin (JARDIANCE) 10 MG TABS tablet Take 10 mg by mouth daily.      EPINEPHrine 0.3 mg/0.3 mL IJ SOAJ injection Inject 0.3 mg into the muscle once.     ferrous sulfate 325 (65 FE) MG tablet Take 325 mg by mouth daily after supper.     gabapentin (NEURONTIN) 400 MG capsule One capsule in the morning and 2 in the evening 270 capsule 3   glipiZIDE (GLUCOTROL XL) 5 MG 24 hr tablet TAKE ONE TABLET BY MOUTH DAILY WITH BREAKFAST 90 tablet 0   lamoTRIgine (LAMICTAL) 150 MG tablet TAKE ONE TABLET BY MOUTH AT BEDTIME 28 tablet 2   levothyroxine (SYNTHROID) 112 MCG tablet Take 112 mcg by mouth daily before breakfast.     losartan (COZAAR) 25 MG tablet TAKE ONE TABLET BY MOUTH DAILY (MORNING) 90 tablet 2   meclizine (ANTIVERT) 25 MG tablet Take 1 tablet (25 mg total) by mouth 3 (three) times daily as needed for dizziness. (Patient taking differently: Take 25 mg by mouth daily as needed for dizziness.) 30 tablet 0   metFORMIN (GLUCOPHAGE) 500 MG tablet TAKE ONE TABLET BY MOUTH TWICE DAILY WITH A MEAL (MORNING ,EVENING) 180 tablet 0   ondansetron (ZOFRAN) 4 MG tablet Take 1 tablet (4 mg total) by mouth every 8 (eight) hours as needed for nausea or vomiting. 20 tablet 0   pantoprazole (PROTONIX) 40 MG tablet Take 1 tablet (40 mg total) by mouth every evening. 90 tablet 3   REPATHA SURECLICK 382 MG/ML SOAJ INJECT ONE DOSE INTO SKIN EVERY 14 DAYS. 2 mL 11   rOPINIRole (REQUIP) 3 MG tablet TAKE ONE TABLET BY MOUTH EVERY MORNING, ,NOON AND AT BEDTIME. (PRT PT MORNING,EVENING,BEDTIME) 270 tablet 3   tiZANidine (ZANAFLEX) 2 MG tablet tizanidine 2 mg tablet  TAKE ONE TABLET BY MOUTH EVERY 8 HOURS AS NEEDED     torsemide (DEMADEX) 20 MG tablet Take 2 tablets (40 mg total) by mouth daily. 180 tablet 3   traMADol (ULTRAM) 50 MG tablet Take 100 mg by mouth 4 (four) times daily as needed.      venlafaxine XR (EFFEXOR-XR) 150 MG 24 hr capsule TAKE TWO (2) CAPSULES BY MOUTH AT BEDTIME 60 capsule 2   Vitamin D, Ergocalciferol, (DRISDOL) 1.25 MG (50000 UNIT) CAPS capsule Take 50,000 Units by  mouth every 7 (seven) days.     No current facility-administered medications for this visit.     Past Surgical History:  Procedure Laterality Date   ABDOMINAL HYSTERECTOMY     ARTHROSCOPY KNEE W/ DRILLING  06/2011   and Decemer of 2013.   Carpel tunnel  1980's   CATARACT EXTRACTION W/PHACO Right 03/21/2017  Procedure: CATARACT EXTRACTION PHACO AND INTRAOCULAR LENS PLACEMENT RIGHT EYE;  Surgeon: Baruch Goldmann, MD;  Location: [facility];  Service: Ophthalmology;  Laterality: Right;  CDE: 2.91    CATARACT EXTRACTION W/PHACO Left 04/18/2017   Procedure: CATARACT EXTRACTION PHACO AND INTRAOCULAR LENS PLACEMENT LEFT EYE;  Surgeon: Baruch Goldmann, MD;  Location: [facility];  Service: Ophthalmology;  Laterality: Left;  left   CHOLECYSTECTOMY     COLONOSCOPY WITH PROPOFOL N/A 10/04/2016   Procedure: COLONOSCOPY WITH PROPOFOL;  Surgeon: Rogene Houston, MD;  Location: [facility];  Service: Endoscopy;  Laterality: N/A;  11:10   CORONARY ARTERY BYPASS GRAFT N/A 08/29/2017   Procedure: CORONARY ARTERY BYPASS GRAFTING (CABG) x 3 WITH ENDOSCOPIC HARVESTING OF RIGHT SAPHENOUS VEIN;  Surgeon: Ivin Poot, MD;  Location: [facility];  Service: Open Heart Surgery;  Laterality: N/A;   ESOPHAGOGASTRODUODENOSCOPY N/A 04/25/2016   Procedure: ESOPHAGOGASTRODUODENOSCOPY (EGD);  Surgeon: Rogene Houston, MD;  [facility];  Service: Endoscopy;  Laterality: N/A;  730   LEFT HEART CATH AND CORONARY ANGIOGRAPHY N/A 08/27/2017   Procedure: LEFT HEART CATH AND CORONARY ANGIOGRAPHY;  Surgeon: Sherren Mocha, MD;  Location: [facility];  Service: Cardiovascular::  pLAD 95% - p-mLAD 50%, ostD1 90% -pD1 80%; ostOM1 90%; rPDA 80%. EF ~50-55% - HK of dital Anterolateral & Apical wall.  - Rec CVTS c/s   OPEN REDUCTION INTERNAL FIXATION (ORIF) HAND Left 04/10/2020   Procedure: OPEN REDUCTION INTERNAL FIXATION (ORIF) HAND, left index finger;  Surgeon: Carole Civil, MD;  Location: [facility];  Service: Orthopedics;   Laterality: Left;  0.045 k wires   RECTAL SURGERY     fissure   SHOULDER SURGERY Left    arthroscopy in March of this year   TEE WITHOUT CARDIOVERSION N/A 08/29/2017   Procedure: TRANSESOPHAGEAL ECHOCARDIOGRAM (TEE);  Surgeon: Prescott Gum, Collier Salina, MD;  Location: [facility];  Service: Open Heart Surgery;  Laterality: N/A;   TRANSTHORACIC ECHOCARDIOGRAM  06/06/2017   Mild to moderate reduced EF 40 and 45%.  Anterior septal, inferoseptal and basal to mid inferior hypokinesis.  GR 1 DD.  No significant valvular lesion     Allergies  Allergen Reactions   Amoxicillin Anaphylaxis and Other (See Comments)    Has patient had a PCN reaction causing immediate rash, facial/tongue/throat swelling, SOB or lightheadedness with hypotension: Yes Has patient had a PCN reaction causing severe rash involving mucus membranes or skin necrosis: Yes Has patient had a PCN reaction that required hospitalization: Yes Has patient had a PCN reaction occurring within the last 10 years: Yes If all of the above answers are "NO", then may proceed with Cephalosporin use.    Hydrocodone Anaphylaxis   Percocet [Oxycodone-Acetaminophen] Other (See Comments)    Causes chest pain /tightness. Pressure pain around her ribs.   Vancomycin Itching   Depacon [Valproic Acid] Other (See Comments)    Causes falls    Depakote [Divalproex Sodium]       Family History  Problem Relation Age of Onset   Hypertension Mother    Lymphoma Mother    Depression Mother    Arthritis Father    Alcohol abuse Father    Hypertension Sister    Cancer Brother        kidney and lung   Alcohol abuse Brother    Alcohol abuse Paternal Uncle    Alcohol abuse Paternal Grandfather    Alcohol abuse Paternal Grandmother    Allergic rhinitis Neg Hx    Angioedema  Neg Hx    Asthma Neg Hx    Atopy Neg Hx    Eczema Neg Hx    Immunodeficiency Neg Hx    Urticaria Neg Hx      Social History Norma [name] reports that she quit smoking about 50 years ago.  Her smoking use included cigarettes. She has never used smokeless tobacco. Norma [name] reports no history of alcohol use.   Review of Systems CONSTITUTIONAL: No weight loss, fever, chills, weakness or fatigue.  HEENT: Eyes: No visual loss, blurred vision, double vision or yellow sclerae.No hearing loss, sneezing, congestion, runny nose or sore throat.  SKIN: No rash or itching.  CARDIOVASCULAR: per hpi RESPIRATORY: per hpi GASTROINTESTINAL: No anorexia, nausea, vomiting or diarrhea. No abdominal pain or blood.  GENITOURINARY: No burning on urination, no polyuria NEUROLOGICAL: No headache, dizziness, syncope, paralysis, ataxia, numbness or tingling in the extremities. No change in bowel or bladder control.  MUSCULOSKELETAL: No muscle, back pain, joint pain or stiffness.  LYMPHATICS: No enlarged nodes. No history of splenectomy.  PSYCHIATRIC: No history of depression or anxiety.  ENDOCRINOLOGIC: No reports of sweating, cold or heat intolerance. No polyuria or polydipsia.  [name]   Physical Examination Today's Vitals   05/07/21 1503  BP: 116/68  Pulse: 88  SpO2: 98%  Weight: 235 lb 6.4 oz (106.8 kg)  Height: 5' (1.524 m)   Body mass index is 45.97 kg/m.  Gen: resting comfortably, no acute distress HEENT: no scleral icterus, pupils equal round and reactive, no palptable cervical adenopathy,  CV: RRR, no mr/g no jvd Resp: Clear to auscultation bilaterally GI: abdomen is soft, non-tender, non-distended, normal bowel sounds, no hepatosplenomegaly MSK: extremities are warm, no edema.  Skin: warm, no rash Neuro:  no focal deficits Psych: appropriate affect   Diagnostic Studies 03/2016 Lexiscan Probable normal perfusion and mild soft tissue attenuation (breast) No significant ischemia or evidence for scar The left ventricular ejection fraction is normal (55-65%). Low risk scan.   08/2017 cath RPDA lesion is 80% stenosed. Ost 1st Mrg to 1st Mrg lesion is 90% stenosed. Prox LAD lesion  is 95% stenosed. Prox LAD to Mid LAD lesion is 50% stenosed. Ost 1st Diag lesion is 90% stenosed. 1st Diag lesion is 80% stenosed. The left ventricular ejection fraction is 50-55% by visual estimate. There is mild left ventricular systolic dysfunction.   1.  Severe multivessel coronary artery disease with severe stenosis of the proximal LAD/first diagonal bifurcation, first obtuse marginal Adanya Sosinski of the circumflex, and PDA Keishawn Rajewski of the RCA. 2.  Mild segmental LV systolic dysfunction with hypokinesis of the distal anterolateral and apical walls, LVEF estimated at 50 to 55%.   08/2017 TEE Aortic valve: The valve is trileaflet. No stenosis. No regurgitation. Mitral valve: Mild regurgitation. Central jet. Right ventricle: Normal cavity size, wall thickness and ejection fraction. Pericardium: Trivial pericardial effusion. Tricuspid valve: No regurgitation Pulmonic valve: No regurgitation by color doppler. Left ventricle: Regional wall motion abnormalities present, dyskinetic basal and apical septal wall, hypokinesis of lateral wall and inferoseptal wall. Increased wall thickness. LVEF 35-40%. No ASD/PFO present Unable to rule out thrombus in LAA but velocities make thrombus less likely.   05/2017 echo Study Conclusions   - Left ventricle: The cavity size was normal. There was mild   concentric hypertrophy. Systolic function was mildly to   moderately reduced. The estimated ejection fraction was in the   range of 40% to 45%. Hypokinesis of the anteroseptal,   inferoseptal and basal to mid inferior myocardium.  Doppler   parameters are consistent with abnormal left ventricular   relaxation (grade 1 diastolic dysfunction). Doppler parameters   are consistent with indeterminate ventricular filling pressure. - Aortic valve: Transvalvular velocity was within the normal range.   There was no stenosis. There was no regurgitation. - Mitral valve: Transvalvular velocity was within the normal  range.   There was no evidence for stenosis. There was no regurgitation. - Right ventricle: The cavity size was normal. Wall thickness was   normal. Systolic function was normal. - Tricuspid valve: There was trivial regurgitation. - Global longitudinal strain -11.9%.     01/2018 echo Study Conclusions   - Left ventricle: The cavity size was normal. Wall thickness was   increased in a pattern of mild LVH. Systolic function was normal.   The estimated ejection fraction was in the range of 50% to 55%.   Regional wall motion abnormalities cannot be excluded. Doppler   parameters are consistent with abnormal left ventricular   relaxation (grade 1 diastolic dysfunction). - Ventricular septum: Septal motion showed abnormal function and   dyssynergy. These changes are consistent with a left bundle   Alleta Avery block. - Mitral valve: Mildly thickened leaflets .    10/2020 echo IMPRESSIONS     1. Markedly abnormal septal motion ? from BBB. Left ventricular ejection  fraction, by estimation, is 50 to 55%. The left ventricle has low normal  function. The left ventricle has no regional wall motion abnormalities.  Left ventricular diastolic  parameters were normal.   2. Right ventricular systolic function is normal. The right ventricular  size is normal.   3. The mitral valve is normal in structure. Trivial mitral valve  regurgitation. No evidence of mitral stenosis.   4. The aortic valve is tricuspid. There is mild calcification of the  aortic valve. Aortic valve regurgitation is not visualized. Mild aortic  valve sclerosis is present, with no evidence of aortic valve stenosis.   5. The inferior vena cava is normal in size with greater than 50%  respiratory variability, suggesting right atrial pressure of 3 mmHg.     Assessment and Plan  1. Chronic diastolic HF - appears euvolemic - limiting diuretic due to renal dysfunction, taking torsemide just prn - continue chrrent meds   2.  CAD - beta blocker stopped due to fatigue.  -no symptoms, cotninue current meds  3. Hyperlipidemia - has done very well on repatha, continue current therapy.       Arnoldo Lenis, M.D.

## 2021-05-07 NOTE — Patient Instructions (Signed)
Follow-Up: °Follow up with Dr. Branch in 6 months. ° °If you need a refill on your cardiac medications before your next appointment, please call your pharmacy. ° °

## 2021-05-09 ENCOUNTER — Institutional Professional Consult (permissible substitution): Payer: Medicare Other | Admitting: Pulmonary Disease

## 2021-05-12 ENCOUNTER — Other Ambulatory Visit: Payer: Self-pay | Admitting: Internal Medicine

## 2021-05-15 ENCOUNTER — Telehealth (HOSPITAL_COMMUNITY): Payer: Medicare Other | Admitting: Psychiatry

## 2021-05-15 DIAGNOSIS — E119 Type 2 diabetes mellitus without complications: Secondary | ICD-10-CM | POA: Diagnosis not present

## 2021-05-15 DIAGNOSIS — E1129 Type 2 diabetes mellitus with other diabetic kidney complication: Secondary | ICD-10-CM | POA: Diagnosis not present

## 2021-05-15 DIAGNOSIS — R809 Proteinuria, unspecified: Secondary | ICD-10-CM | POA: Diagnosis not present

## 2021-05-15 DIAGNOSIS — N189 Chronic kidney disease, unspecified: Secondary | ICD-10-CM | POA: Diagnosis not present

## 2021-05-15 DIAGNOSIS — E782 Mixed hyperlipidemia: Secondary | ICD-10-CM | POA: Diagnosis not present

## 2021-05-15 DIAGNOSIS — I129 Hypertensive chronic kidney disease with stage 1 through stage 4 chronic kidney disease, or unspecified chronic kidney disease: Secondary | ICD-10-CM | POA: Diagnosis not present

## 2021-05-15 DIAGNOSIS — I10 Essential (primary) hypertension: Secondary | ICD-10-CM | POA: Diagnosis not present

## 2021-05-15 DIAGNOSIS — I5043 Acute on chronic combined systolic (congestive) and diastolic (congestive) heart failure: Secondary | ICD-10-CM | POA: Diagnosis not present

## 2021-05-15 DIAGNOSIS — E1122 Type 2 diabetes mellitus with diabetic chronic kidney disease: Secondary | ICD-10-CM | POA: Diagnosis not present

## 2021-05-17 ENCOUNTER — Encounter: Payer: Self-pay | Admitting: Cardiology

## 2021-05-17 ENCOUNTER — Other Ambulatory Visit: Payer: Self-pay

## 2021-05-17 ENCOUNTER — Telehealth (HOSPITAL_COMMUNITY): Payer: Medicare Other | Admitting: Psychiatry

## 2021-05-17 ENCOUNTER — Encounter (HOSPITAL_COMMUNITY): Payer: Self-pay

## 2021-05-19 DIAGNOSIS — M1990 Unspecified osteoarthritis, unspecified site: Secondary | ICD-10-CM | POA: Diagnosis not present

## 2021-05-19 DIAGNOSIS — H9201 Otalgia, right ear: Secondary | ICD-10-CM | POA: Diagnosis not present

## 2021-05-21 DIAGNOSIS — M48062 Spinal stenosis, lumbar region with neurogenic claudication: Secondary | ICD-10-CM | POA: Diagnosis not present

## 2021-05-21 DIAGNOSIS — M5416 Radiculopathy, lumbar region: Secondary | ICD-10-CM | POA: Diagnosis not present

## 2021-05-21 DIAGNOSIS — M48061 Spinal stenosis, lumbar region without neurogenic claudication: Secondary | ICD-10-CM | POA: Diagnosis not present

## 2021-05-22 ENCOUNTER — Ambulatory Visit (INDEPENDENT_AMBULATORY_CARE_PROVIDER_SITE_OTHER): Payer: Medicare Other | Admitting: Internal Medicine

## 2021-05-22 ENCOUNTER — Encounter (INDEPENDENT_AMBULATORY_CARE_PROVIDER_SITE_OTHER): Payer: Self-pay | Admitting: Internal Medicine

## 2021-05-22 ENCOUNTER — Telehealth (INDEPENDENT_AMBULATORY_CARE_PROVIDER_SITE_OTHER): Payer: Medicare Other | Admitting: Psychiatry

## 2021-05-22 ENCOUNTER — Encounter (HOSPITAL_COMMUNITY): Payer: Self-pay | Admitting: Psychiatry

## 2021-05-22 ENCOUNTER — Other Ambulatory Visit: Payer: Self-pay

## 2021-05-22 DIAGNOSIS — I2510 Atherosclerotic heart disease of native coronary artery without angina pectoris: Secondary | ICD-10-CM | POA: Diagnosis not present

## 2021-05-22 DIAGNOSIS — F331 Major depressive disorder, recurrent, moderate: Secondary | ICD-10-CM | POA: Diagnosis not present

## 2021-05-22 DIAGNOSIS — R1319 Other dysphagia: Secondary | ICD-10-CM | POA: Diagnosis not present

## 2021-05-22 DIAGNOSIS — R1310 Dysphagia, unspecified: Secondary | ICD-10-CM | POA: Insufficient documentation

## 2021-05-22 DIAGNOSIS — D509 Iron deficiency anemia, unspecified: Secondary | ICD-10-CM | POA: Diagnosis not present

## 2021-05-22 DIAGNOSIS — K219 Gastro-esophageal reflux disease without esophagitis: Secondary | ICD-10-CM | POA: Diagnosis not present

## 2021-05-22 MED ORDER — PANTOPRAZOLE SODIUM 40 MG PO TBEC: 40.0000 mg | DELAYED_RELEASE_TABLET | Freq: Every day | ORAL | 3 refills | Status: DC

## 2021-05-22 MED ORDER — ALPRAZOLAM 0.25 MG PO TABS: 0.2500 mg | ORAL_TABLET | Freq: Every day | ORAL | 2 refills | Status: DC | PRN

## 2021-05-22 MED ORDER — VENLAFAXINE HCL ER 150 MG PO CP24: ORAL_CAPSULE | ORAL | 2 refills | Status: DC

## 2021-05-22 MED ORDER — BUPROPION HCL ER (XL) 150 MG PO TB24: 150.0000 mg | ORAL_TABLET | Freq: Every morning | ORAL | 2 refills | Status: DC

## 2021-05-22 MED ORDER — LAMOTRIGINE 150 MG PO TABS: 150.0000 mg | ORAL_TABLET | Freq: Every day | ORAL | 2 refills | Status: DC

## 2021-05-22 NOTE — Patient Instructions (Signed)
Physician will call with results of barium study when completed. 

## 2021-05-22 NOTE — Progress Notes (Addendum)
Virtual Visit via Telephone Note  I connected with Norma [name] on 05/22/21 at  4:24 PM EST by telephone and verified that I am speaking with the correct person using two identifiers.  Location: Patient: home Provider: office   I discussed the limitations, risks, security and privacy concerns of performing an evaluation and management service by telephone and the availability of in person appointments. I also discussed with the patient that there may be a patient responsible charge related to this service. The patient expressed understanding and agreed to proceed.   History of Present Illness:  Patient is 71 year old Caucasian female with multiple medical problems who also has chronic GERD and history of iron deficiency anemia.  Her last office visit was was also telephone visit in January 2022. She says she is doing good as well as heartburn is concerned.  PPI is working very well.  She is not having any side effects with pantoprazole.  She now complains of dysphagia to pills and bread.  She has no difficulty with liquids.  She points to suprasternal area as site of bolus obstruction.  She says her bowels move daily.  She denies rectal bleeding.  Stools at times and dark which she feels is due to fact she is taking iron.  She says her appetite is good.  She has lost 5 pounds since her visit 1 year ago.  She she weighed 230 pounds at physician's office yesterday.  Her weight 1 year ago was 235 pounds. She says she uses Zofran occasionally.  Last dose was more than a month ago.  She takes KCl only once a day.  She is using Demadex on as-needed basis but not every day.  She says abdominal skin ulcer is almost completely healed.  She is having back issues and is scheduled to undergo surgery on 08/05/2021.  Her last EGD was on 04/25/2016 for GERD and noncardiac chest pain revealing small sliding hiatal hernia mild portal hypertensive gastropathy and gastritis.  Gastric biopsy was negative for H.  pylori.  Colonoscopy on October 04, 2016 was unremarkable.    Current Outpatient Medications:    ALPRAZolam (XANAX) 0.25 MG tablet, Take 1 tablet (0.25 mg total) by mouth daily as needed for anxiety., Disp: 30 tablet, Rfl: 2   aspirin EC 81 MG tablet, Take 1 tablet (81 mg total) by mouth daily., Disp: 90 tablet, Rfl: 3   buPROPion (WELLBUTRIN XL) 150 MG 24 hr tablet, Take 1 tablet (150 mg total) by mouth every morning., Disp: 30 tablet, Rfl: 2   carvedilol (COREG) 6.25 MG tablet, TAKE ONE TABLET BY MOUTH TWICE DAILY (MORNING,EVENING), Disp: 180 tablet, Rfl: 2   cetirizine (ZYRTEC) 10 MG tablet, Take 1 tablet tablet 1-2 times daily. (Patient taking differently: Take by mouth at bedtime.), Disp: 60 tablet, Rfl: 5   diclofenac Sodium (VOLTAREN) 1 % GEL, Apply topically 4 (four) times daily., Disp: , Rfl:    diphenhydrAMINE (BENADRYL) 25 mg capsule, Take 25 mg by mouth as needed., Disp: , Rfl:    empagliflozin (JARDIANCE) 10 MG TABS tablet, Take 10 mg by mouth daily., Disp: , Rfl:    EPINEPHrine 0.3 mg/0.3 mL IJ SOAJ injection, Inject 0.3 mg into the muscle once., Disp: , Rfl:    ferrous sulfate 325 (65 FE) MG tablet, Take 325 mg by mouth daily after supper., Disp: , Rfl:    gabapentin (NEURONTIN) 400 MG capsule, One capsule in the morning and 2 in the evening, Disp: 270 capsule, Rfl: 3  glipiZIDE (GLUCOTROL XL) 5 MG 24 hr tablet, TAKE ONE TABLET BY MOUTH DAILY WITH BREAKFAST (Patient taking differently: One bid), Disp: 90 tablet, Rfl: 0   lamoTRIgine (LAMICTAL) 150 MG tablet, Take 1 tablet (150 mg total) by mouth at bedtime., Disp: 30 tablet, Rfl: 2   levothyroxine (SYNTHROID) 112 MCG tablet, Take 112 mcg by mouth daily before breakfast., Disp: , Rfl:    losartan (COZAAR) 25 MG tablet, TAKE ONE TABLET BY MOUTH DAILY (MORNING), Disp: 90 tablet, Rfl: 2   meclizine (ANTIVERT) 25 MG tablet, Take 1 tablet (25 mg total) by mouth 3 (three) times daily as needed for dizziness. (Patient taking differently:  Take 25 mg by mouth daily as needed for dizziness.), Disp: 30 tablet, Rfl: 0   metFORMIN (GLUCOPHAGE) 500 MG tablet, TAKE ONE TABLET BY MOUTH TWICE DAILY WITH A MEAL (MORNING ,EVENING), Disp: 180 tablet, Rfl: 0   ondansetron (ZOFRAN) 4 MG tablet, Take 1 tablet (4 mg total) by mouth every 8 (eight) hours as needed for nausea or vomiting., Disp: 20 tablet, Rfl: 0   OVER THE COUNTER MEDICATION, CBD Gummies, Disp: , Rfl:    pantoprazole (PROTONIX) 40 MG tablet, Take 1 tablet (40 mg total) by mouth every evening., Disp: 90 tablet, Rfl: 3   potassium chloride SA (KLOR-CON M) 20 MEQ tablet, Take 20 mEq by mouth. Takes as needed for cramps, Disp: , Rfl:    REPATHA SURECLICK 269 MG/ML SOAJ, INJECT ONE DOSE INTO SKIN EVERY 14 DAYS., Disp: 2 mL, Rfl: 11   rOPINIRole (REQUIP) 3 MG tablet, TAKE ONE TABLET BY MOUTH EVERY MORNING, ,NOON AND AT BEDTIME. (PRT PT MORNING,EVENING,BEDTIME), Disp: 270 tablet, Rfl: 3   tiZANidine (ZANAFLEX) 2 MG tablet, tizanidine 2 mg tablet  TAKE ONE TABLET BY MOUTH EVERY 8 HOURS AS NEEDED, Disp: , Rfl:    torsemide (DEMADEX) 20 MG tablet, Take 2 tablets (40 mg total) by mouth daily. (Patient taking differently: Take 40 mg by mouth as needed. Takes one as needed), Disp: 180 tablet, Rfl: 3   traMADol (ULTRAM) 50 MG tablet, Take 100 mg by mouth 4 (four) times daily as needed. , Disp: , Rfl:    venlafaxine XR (EFFEXOR-XR) 150 MG 24 hr capsule, TAKE TWO (2) CAPSULES BY MOUTH AT BEDTIME, Disp: 60 capsule, Rfl: 2   Vitamin D, Ergocalciferol, (DRISDOL) 1.25 MG (50000 UNIT) CAPS capsule, Take 50,000 Units by mouth every 7 (seven) days., Disp: , Rfl:   Observations/Objective:  Patient reported her weight to be 230 pounds.  She was weighed at [place] office yesterday.  Lab data.  H&H 10.7 and 33.4 on 09/14/2020.  Serum ferritin 85 on 03/28/2021.  Lab data from 05/07/2020 WBC 9.5 H&H 12.3 and 37.4 MCV 92 Platelet count 362K.  Serum iron 74, TIBC 366 saturation 20% and serum ferritin  366. B12 level 332. BUN 12 and creatinine 1.31 Bilirubin 0.3, AP 66, AST 21, ALT 22, total protein 6.9 and albumin 3.4. Serum calcium 9.4.    Assessment and Plan:  #1.  Chronic GERD.  She is doing well with antireflux measures and pantoprazole.  #2.  Esophageal dysphagia.  Esophagogastroduodenoscopy in January 2018 revealed small sliding hiatal hernia but no esophageal abnormality.  It remains to be seen if dysphagia is due to Schatzki's ring stricture or motility disorder. We will proceed with barium pill esophagogram.  #3.  History of iron deficiency anemia.  She had a EGD in January 2018 revealing gastritis.  H. pylori stains were negative.  Colonoscopy in June 2018  was unremarkable.  Iron deficiency anemia felt to be due to impaired iron absorption due to chronic acid suppression. She will continue ferrous sulfate 325 mg by mouth daily.  Follow Up Instructions:  Continue pantoprazole at 40 mg p.o. every morning. Barium pill esophagogram. Office visit in 1 year. I discussed the assessment and treatment plan with the patient. The patient was provided an opportunity to ask questions and all were answered. The patient agreed with the plan and demonstrated an understanding of the instructions.   The patient was advised to call back or seek an in-person evaluation if the symptoms worsen or if the condition fails to improve as anticipated.  I provided 12 minutes of non-face-to-face time during this encounter.   Hildred Laser, MD

## 2021-05-22 NOTE — Progress Notes (Signed)
Virtual Visit via Telephone Note  I connected with Norma [name] on 05/22/21 at 10:20 AM EST by telephone and verified that I am speaking with the correct person using two identifiers.  Location: Patient: home Provider: home office   I discussed the limitations, risks, security and privacy concerns of performing an evaluation and management service by telephone and the availability of in person appointments. I also discussed with the patient that there may be a patient responsible charge related to this service. The patient expressed understanding and agreed to proceed.      I discussed the assessment and treatment plan with the patient. The patient was provided an opportunity to ask questions and all were answered. The patient agreed with the plan and demonstrated an understanding of the instructions.   The patient was advised to call back or seek an in-person evaluation if the symptoms worsen or if the condition fails to improve as anticipated.  I provided 12 minutes of non-face-to-face time during this encounter.   [name], MD  [facility] MD/PA/NP OP Progress Note  05/22/2021 10:36 AM Norma [name]  MRN:  [id number]  Chief Complaint:  Chief Complaint   Depression; Anxiety; Follow-up    HPI: This patient is a 71 year old single white female who lives alone in [place].  She is no longer working  The patient returns for follow-up after 3 months regarding her depression and anxiety.  She states that she is going to have to have back surgery probably in April.  Her back pain has gotten worse and her mobility is limited and she is walking with a walker.  She is trying to get out and is still able to drive.  She denies significant depression or anxiety.  She is sleeping well.  She denies thoughts of self-harm or suicidal ideation. Visit Diagnosis:    ICD-10-CM   1. Moderate episode of recurrent major depressive disorder ([place])  F33.1       Past Psychiatric History: Patient was  hospitalized in 2001 and went through a course of ECT.  She has been tried on Effexor Wellbutrin and Zyprexa  Past Medical History:  Past Medical History:  Diagnosis Date   Anemia    Anxiety    Bipolar disorder ([place])    Bulging lumbar disc    L3-4   Chronic daily headache    Chronic low back pain 09/20/2014   COPD (chronic obstructive pulmonary disease) (HCC)    Degenerative arthritis    Depression    Diabetes mellitus, type II ([place])    Diabetic neuropathy ([place]) 09/16/2018   DM type 2 with diabetic peripheral neuropathy ([place]) 09/20/2014   Dyslipidemia    Dyspnea    Gastroparesis    GERD (gastroesophageal reflux disease)    Heart murmur    History of hiatal hernia    Hypothyroidism    IBS (irritable bowel syndrome)    Memory difficulty 09/20/2014   Morbid obesity ([place])    Neuropathy    Obstructive sleep apnea on CPAP    Restless legs syndrome (RLS) 09/17/2012   Stroke (cerebrum) ([place]) 05/16/2017   Left parietal   Urticaria     Past Surgical History:  Procedure Laterality Date   ABDOMINAL HYSTERECTOMY     ARTHROSCOPY KNEE W/ DRILLING  06/2011   and Dece[place] of 2013.   Carpel tunnel  1980's   CATARACT EXTRACTION W/PHACO Right 03/21/2017   Procedure: CATARACT EXTRACTION PHACO AND INTRAOCULAR LENS PLACEMENT RIGHT EYE;  Surgeon: Baruch Goldmann, MD;  [facility]  [facility];  Service: Ophthalmology;  Laterality: Right;  CDE: 2.91    CATARACT EXTRACTION W/PHACO Left 04/18/2017   Procedure: CATARACT EXTRACTION PHACO AND INTRAOCULAR LENS PLACEMENT LEFT EYE;  Surgeon: Baruch Goldmann, MD;  Location: [facility];  Service: Ophthalmology;  Laterality: Left;  left   CHOLECYSTECTOMY     COLONOSCOPY WITH PROPOFOL N/A 10/04/2016   Procedure: COLONOSCOPY WITH PROPOFOL;  Surgeon: Rogene [place], MD;  [facility]: [facility];  Service: Endoscopy;  Laterality: N/A;  11:10   CORONARY ARTERY BYPASS GRAFT N/A 08/29/2017   Procedure: CORONARY ARTERY BYPASS GRAFTING (CABG) x 3 WITH ENDOSCOPIC HARVESTING OF RIGHT  SAPHENOUS VEIN;  Surgeon: Ivin Poot, MD;  Location: [place];  Service: Open Heart Surgery;  Laterality: N/A;   ESOPHAGOGASTRODUODENOSCOPY N/A 04/25/2016   Procedure: ESOPHAGOGASTRODUODENOSCOPY (EGD);  Surgeon: Rogene [place], MD;  [facility];  Service: Endoscopy;  Laterality: N/A;  730   LEFT HEART CATH AND CORONARY ANGIOGRAPHY N/A 08/27/2017   Procedure: LEFT HEART CATH AND CORONARY ANGIOGRAPHY;  Surgeon: Sherren Mocha, MD;  Location: [facility];  Service: Cardiovascular::  pLAD 95% - p-mLAD 50%, ostD1 90% -pD1 80%; ostOM1 90%; rPDA 80%. EF ~50-55% - HK of dital Anterolateral & Apical wall.  - Rec CVTS c/s   OPEN REDUCTION INTERNAL FIXATION (ORIF) HAND Left 04/10/2020   Procedure: OPEN REDUCTION INTERNAL FIXATION (ORIF) HAND, left index finger;  Surgeon: Carole Civil, MD;  Location: [facility];  Service: Orthopedics;  Laterality: Left;  0.045 k wires   RECTAL SURGERY     fissure   SHOULDER SURGERY Left    arthroscopy in March of this year   TEE WITHOUT CARDIOVERSION N/A 08/29/2017   Procedure: TRANSESOPHAGEAL ECHOCARDIOGRAM (TEE);  Surgeon: Prescott Gum, Collier Salina, MD;  Location: [facility];  Service: Open Heart Surgery;  Laterality: N/A;   TRANSTHORACIC ECHOCARDIOGRAM  06/06/2017   Mild to moderate reduced EF 40 and 45%.  Anterior septal, inferoseptal and basal to mid inferior hypokinesis.  GR 1 DD.  No significant valvular lesion    Family Psychiatric History: see below  Family History:  Family History  Problem Relation Age of Onset   Hypertension Mother    Lymphoma Mother    Depression Mother    Arthritis Father    Alcohol abuse Father    Hypertension Sister    Cancer Brother        kidney and lung   Alcohol abuse Brother    Alcohol abuse Paternal Uncle    Alcohol abuse Paternal Grandfather    Alcohol abuse Paternal Grandmother    Allergic rhinitis Neg Hx    Angioedema Neg Hx    Asthma Neg Hx    Atopy Neg Hx    Eczema Neg Hx    Immunodeficiency Neg Hx     Urticaria Neg Hx     Social History:  Social History   Socioeconomic History   Marital status: Single    Spouse name: Not on file   Number of children: 0   Years of education: 16   Highest education level: Not on file  Occupational History    Employer: [employer]  Tobacco Use   Smoking status: Former    Types: Cigarettes    Quit date: 02/04/1971    Years since quitting: 50.3   Smokeless tobacco: Never   Tobacco comments:    smoked 2 cigarettes a day  Vaping Use   Vaping Use: Never used  Substance and Sexual Activity  Alcohol use: No    Alcohol/week: 0.0 standard drinks   Drug use: No   Sexual activity: Never  Other Topics Concern   Not on file  Social History Narrative   Patient lives at home alone.    Patient has no children.    Patient has her masters in nursing.    Patient is single.    Patient drinks about 2 glasses of tea daily.   Patient is right handed.   Social Determinants of Health   Financial Resource Strain: Not on file  Food Insecurity: Not on file  Transportation Needs: Not on file  Physical Activity: Not on file  Stress: Not on file  Social Connections: Not on file    Allergies:  Allergies  Allergen Reactions   Amoxicillin Anaphylaxis and Other (See Comments)    Has patient had a PCN reaction causing immediate rash, facial/tongue/throat swelling, SOB or lightheadedness with hypotension: Yes Has patient had a PCN reaction causing severe rash involving mucus membranes or skin necrosis: Yes Has patient had a PCN reaction that required hospitalization: Yes Has patient had a PCN reaction occurring within the last 10 years: Yes If all of the above answers are "NO", then may proceed with Cephalosporin use.    Hydrocodone Anaphylaxis   Percocet [Oxycodone-Acetaminophen] Other (See Comments)    Causes chest pain /tightness. Pressure pain around her ribs.   Vancomycin Itching   Depacon [Valproate Sodium]    Depacon [Valproic Acid] Other  (See Comments)    Causes falls     Metabolic Disorder Labs: Lab Results  Component Value Date   HGBA1C 6.6 11/08/2019   MPG 148 03/05/2019   MPG 136.98 10/30/2018   No results found for: PROLACTIN Lab Results  Component Value Date   CHOL 129 11/08/2019   TRIG 158 11/08/2019   HDL 60 11/08/2019   CHOLHDL 3.8 03/05/2019   VLDL 31 (H) 07/10/2016   LDLCALC 43 11/08/2019   LDLCALC 152 (H) 03/05/2019   Lab Results  Component Value Date   TSH 1.49 03/05/2019   TSH 2.671 10/30/2018    Therapeutic Level Labs: No results found for: LITHIUM No results found for: VALPROATE No components found for:  CBMZ  Current Medications: Current Outpatient Medications  Medication Sig Dispense Refill   ALPRAZolam (XANAX) 0.25 MG tablet Take 1 tablet (0.25 mg total) by mouth daily as needed for anxiety. 30 tablet 2   aspirin EC 81 MG tablet Take 1 tablet (81 mg total) by mouth daily. 90 tablet 3   buPROPion (WELLBUTRIN XL) 150 MG 24 hr tablet Take 1 tablet (150 mg total) by mouth every morning. 30 tablet 2   calcium carbonate (TUMS - DOSED IN MG ELEMENTAL CALCIUM) 500 MG chewable tablet Chew 2 tablets by mouth as needed for indigestion or heartburn.      carvedilol (COREG) 6.25 MG tablet TAKE ONE TABLET BY MOUTH TWICE DAILY (MORNING,EVENING) 180 tablet 2   cetirizine (ZYRTEC) 10 MG tablet Take 1 tablet tablet 1-2 times daily. (Patient taking differently: Take by mouth at bedtime.) 60 tablet 5   diclofenac Sodium (VOLTAREN) 1 % GEL Apply topically 4 (four) times daily.     diphenhydrAMINE (BENADRYL) 25 mg capsule Take 25 mg by mouth as needed.     empagliflozin (JARDIANCE) 10 MG TABS tablet Take 10 mg by mouth daily.     EPINEPHrine 0.3 mg/0.3 mL IJ SOAJ injection Inject 0.3 mg into the muscle once.     ferrous sulfate 325 (65 FE) MG  tablet Take 325 mg by mouth daily after supper.     gabapentin (NEURONTIN) 400 MG capsule One capsule in the morning and 2 in the evening 270 capsule 3   glipiZIDE  (GLUCOTROL XL) 5 MG 24 hr tablet TAKE ONE TABLET BY MOUTH DAILY WITH BREAKFAST 90 tablet 0   lamoTRIgine (LAMICTAL) 150 MG tablet Take 1 tablet (150 mg total) by mouth at bedtime. 30 tablet 2   levothyroxine (SYNTHROID) 112 MCG tablet Take 112 mcg by mouth daily before breakfast.     losartan (COZAAR) 25 MG tablet TAKE ONE TABLET BY MOUTH DAILY (MORNING) 90 tablet 2   meclizine (ANTIVERT) 25 MG tablet Take 1 tablet (25 mg total) by mouth 3 (three) times daily as needed for dizziness. (Patient taking differently: Take 25 mg by mouth daily as needed for dizziness.) 30 tablet 0   metFORMIN (GLUCOPHAGE) 500 MG tablet TAKE ONE TABLET BY MOUTH TWICE DAILY WITH A MEAL (MORNING ,EVENING) 180 tablet 0   ondansetron (ZOFRAN) 4 MG tablet Take 1 tablet (4 mg total) by mouth every 8 (eight) hours as needed for nausea or vomiting. 20 tablet 0   OVER THE COUNTER MEDICATION CBD Gummies     pantoprazole (PROTONIX) 40 MG tablet Take 1 tablet (40 mg total) by mouth every evening. 90 tablet 3   REPATHA SURECLICK 106 MG/ML SOAJ INJECT ONE DOSE INTO SKIN EVERY 14 DAYS. 2 mL 11   rOPINIRole (REQUIP) 3 MG tablet TAKE ONE TABLET BY MOUTH EVERY MORNING, ,NOON AND AT BEDTIME. (PRT PT MORNING,EVENING,BEDTIME) 270 tablet 3   tiZANidine (ZANAFLEX) 2 MG tablet tizanidine 2 mg tablet  TAKE ONE TABLET BY MOUTH EVERY 8 HOURS AS NEEDED     torsemide (DEMADEX) 20 MG tablet Take 2 tablets (40 mg total) by mouth daily. (Patient taking differently: Take 40 mg by mouth as needed.) 180 tablet 3   traMADol (ULTRAM) 50 MG tablet Take 100 mg by mouth 4 (four) times daily as needed.      venlafaxine XR (EFFEXOR-XR) 150 MG 24 hr capsule TAKE TWO (2) CAPSULES BY MOUTH AT BEDTIME 60 capsule 2   Vitamin D, Ergocalciferol, (DRISDOL) 1.25 MG (50000 UNIT) CAPS capsule Take 50,000 Units by mouth every 7 (seven) days.     No current facility-administered medications for this visit.     Musculoskeletal: Strength & Muscle Tone: na Gait & Station:  na Patient leans: N/A  Psychiatric Specialty Exam: Review of Systems  Musculoskeletal:  Positive for arthralgias, back pain and gait problem.  All other systems reviewed and are negative.  There were no vitals taken for this visit.There is no height or weight on file to calculate BMI.  General Appearance: NA  Eye Contact:  NA  Speech:  Clear and Coherent  Volume:  Normal  Mood:  Euthymic  Affect:  NA  Thought Process:  Goal Directed  Orientation:  Full (Time, Place, and Person)  Thought Content: WDL   Suicidal Thoughts:  No  Homicidal Thoughts:  No  Memory:  Immediate;   Good Recent;   Good Remote;   Good  Judgement:  Good  Insight:  Good  Psychomotor Activity:  Decreased  Concentration:  Concentration: Good and Attention Span: Good  Recall:  Good  Fund of Knowledge: Good  Language: Good  Akathisia:  No  Handed:  Right  AIMS (if indicated): not done  Assets:  Communication Skills Desire for Improvement Resilience Social Support Talents/Skills  ADL's:  Intact  Cognition: WNL  Sleep:  Good  Screenings: Mini-Mental    Flowsheet Row Office Visit from 02/18/2018 in [facility] Office Visit from 09/17/2017 in [facility]  Total Score (max 30 points ) 28 28      PHQ2-9    Flowsheet Row Video Visit from 05/22/2021 in [facility] Video Visit from 02/15/2021 in [facility] Video Visit from 11/16/2020 in [facility] Video Visit from 08/03/2020 in [facility] from 05/17/2019 in [facility]  PHQ-2 Total Score 0 0 0 0 1      Flowsheet Row Video Visit from 05/22/2021 in [facility] Video Visit from 02/15/2021 in [facility] Video Visit from  11/16/2020 in [facility] No Risk No Risk No Risk        Assessment and Plan: This patient is a 71 year old female with history depression anxiety mood swings and insomnia.  She continues to do well on her current regimen.  She will continue Wellbutrin XL 150 mg daily as well as Effexor XR 300 mg daily for depression, Lamictal 100 mg daily for mood stabilization.  She rarely takes Xanax 0.25 mg once daily as needed for anxiety.  She will return to see me in 3 months   [name], MD 05/22/2021, 10:36 AM

## 2021-05-23 ENCOUNTER — Telehealth: Payer: Self-pay | Admitting: Cardiology

## 2021-05-23 NOTE — Telephone Encounter (Signed)
° °  Name: [name]  DOB: [birth year]  MRN: [id number]   Primary Cardiologist: Carlyle Dolly, MD  Chart reviewed as part of pre-operative protocol coverage. Patient was contacted 05/23/2021 in reference to pre-operative risk assessment for pending surgery as outlined below.  Norma [name] was recently seen on 05/07/2021 - has hx of chronic diastolic CHF and CAD s/p prior CABG as outlined. Last echo 10/2020 EF 50-55%. Per his note from that visit, patient had increased edema and DOE from parking lot to office. Given this she is unable to complete 4 METS to be able to clear over the phone so will route to Dr. Harl Bowie for input on clearing for lumbar fusion surgery, as well as whether she may hold ASA if needed. Dr. Harl Bowie - Please route response to P CV DIV PREOP (the pre-op pool). Thank you.   Charlie Pitter, PA-C 05/23/2021, 1:48 PM

## 2021-05-23 NOTE — Telephone Encounter (Signed)
° °  Pre-operative Risk Assessment    Patient Name: Norma [name]  DOB: 1951-01-21 MRN: [id number]      Request for Surgical Clearance    Procedure:   LUMBAR FUSION  Date of Surgery:  Clearance TBD                                   Surgeon:  DR. Duffy Rhody   Surgeon's Group or Practice Name:  [facility] Phone number:  [phone] EXT [phone] Fax number:  [phone] ATTENTION NIKKI   Type of Clearance Requested:   - Medical  - Pharmacy:  Hold DID NOT STATE WHAT TO HOLD WAS JUST ASKING      Type of Anesthesia:  General    Additional requests/questions:  Please advise surgeon/provider what medications should be held. Please fax a copy of CLEARANCE to the surgeon's office.  Signed, Desma Paganini   05/23/2021, 10:41 AM

## 2021-05-24 ENCOUNTER — Other Ambulatory Visit (INDEPENDENT_AMBULATORY_CARE_PROVIDER_SITE_OTHER): Payer: Self-pay

## 2021-05-24 DIAGNOSIS — R1319 Other dysphagia: Secondary | ICD-10-CM

## 2021-05-24 DIAGNOSIS — R6339 Other feeding difficulties: Secondary | ICD-10-CM

## 2021-05-24 DIAGNOSIS — R112 Nausea with vomiting, unspecified: Secondary | ICD-10-CM

## 2021-05-25 NOTE — Telephone Encounter (Signed)
Unless new symptoms of chest pain or SOB ok to proceed with surgery   Zandra Abts MD

## 2021-05-27 ENCOUNTER — Encounter: Payer: Self-pay | Admitting: Pulmonary Disease

## 2021-05-27 NOTE — Telephone Encounter (Signed)
Labs look good, some renal dysfunction but similar to prior labs. Defer full interpretation to Dr Theador Hawthorne of the multiple labs collected  J Kaevion Sinclair MD

## 2021-05-27 NOTE — Telephone Encounter (Signed)
° °  Name: Norma [name]  DOB: 12-31-1950  MRN: [id number]   Primary Cardiologist: Carlyle Dolly, MD  Chart reviewed as part of pre-operative protocol coverage. Patient was contacted 05/27/2021 in reference to pre-operative risk assessment for pending surgery as outlined below.  Norma [name] was last seen on 05/07/2021 by Dr. Carlyle Dolly.  Since that day, Norma [name] has done well without exertional chest pain or worsening dyspnea.  Therefore, based on ACC/AHA guidelines, the patient would be at acceptable risk for the planned procedure without further cardiovascular testing.   If absolutely needed, may hold aspirin for 1 week prior to the surgery and restart as soon as possible afterward at the surgeon's discretion.  The patient was advised that if she develops new symptoms prior to surgery to contact our office to arrange for a follow-up visit, and she verbalized understanding.  I will route this recommendation to the requesting party via Epic fax function and remove from pre-op pool. Please call with questions.  [place], [place] 05/27/2021, 6:01 PM

## 2021-05-28 ENCOUNTER — Telehealth: Payer: Self-pay | Admitting: Neurology

## 2021-05-28 ENCOUNTER — Other Ambulatory Visit (INDEPENDENT_AMBULATORY_CARE_PROVIDER_SITE_OTHER): Payer: Self-pay

## 2021-05-28 ENCOUNTER — Encounter: Payer: Self-pay | Admitting: Neurology

## 2021-05-28 DIAGNOSIS — R112 Nausea with vomiting, unspecified: Secondary | ICD-10-CM

## 2021-05-28 DIAGNOSIS — R1319 Other dysphagia: Secondary | ICD-10-CM

## 2021-05-28 NOTE — Telephone Encounter (Signed)
I have received the paperwork, it has been signed and faxed back to [facility] for the pt.

## 2021-05-28 NOTE — Telephone Encounter (Signed)
Pt called stating that [facility] sent in request for her to continue her oxygen on 05/25/21 and she would like to know if this has been faxed back.

## 2021-05-28 NOTE — Telephone Encounter (Signed)
Is there a way we can mail the patient's consult paperwork to her before her appt please

## 2021-06-06 ENCOUNTER — Other Ambulatory Visit: Payer: Self-pay

## 2021-06-06 ENCOUNTER — Encounter: Payer: Self-pay | Admitting: Pulmonary Disease

## 2021-06-06 ENCOUNTER — Ambulatory Visit (INDEPENDENT_AMBULATORY_CARE_PROVIDER_SITE_OTHER): Payer: Medicare Other | Admitting: Pulmonary Disease

## 2021-06-06 VITALS — BP 118/64 | HR 92 | Temp 98.4°F | Ht 61.0 in | Wt 236.0 lb

## 2021-06-06 DIAGNOSIS — I2510 Atherosclerotic heart disease of native coronary artery without angina pectoris: Secondary | ICD-10-CM | POA: Diagnosis not present

## 2021-06-06 DIAGNOSIS — G4733 Obstructive sleep apnea (adult) (pediatric): Secondary | ICD-10-CM | POA: Diagnosis not present

## 2021-06-06 DIAGNOSIS — G4734 Idiopathic sleep related nonobstructive alveolar hypoventilation: Secondary | ICD-10-CM

## 2021-06-06 NOTE — Assessment & Plan Note (Signed)
Cause for nocturnal hypoxia is not apparent on today's evaluation.  Previous PFTs have not shown any evidence of airway obstruction.  No ILD on previous imaging. No evidence of pulmonary hypertension on review of recent echo.  It is possible that the hypoxia documented in 2022 was related to chronic diastolic heart failure. She does not have any reason for hypoventilation.  We will repeat nocturnal oximetry on CPAP/room air to evaluate

## 2021-06-06 NOTE — Progress Notes (Signed)
Subjective:    Patient ID: Norma [name], female    DOB: Apr 02, 1951, 71 y.o.   MRN: [id number]  HPI  71 year old minimal remote smoker for evaluation of nocturnal hypoxia and OSA, referred by neurology. She is a retired Marine scientist, and reports diagnosis of OSA for more than 20 years maintained on CPAP.  DME for CPAP is [facility] She was started on oxygen after sleep study in 2021  PMH -diabetes, hypertension, CKD stage IIIb, spinal stenosis, bilateral TKR, CABG 2019, HFpEF -Restless leg syndrome  Epworth Sleepiness Scale is 13 and she reports sleepiness while sitting and reading, sitting inactive in a public place or as a passenger in a car and lying down to rest in the afternoon.  Bedtime is between 10 PM to 3 AM, sleep latency can be about 1 hour, she has settled down with a full facemask, reports minimal nocturnal awakenings and is out of bed anywhere between 8 AM to 10 AM feeling rested without dryness of mouth or headaches. She is compliant with nocturnal oxygen but reports that many at times her CPAP mask will fall off in the early morning  She is a remote smoker smoked less than 5 packs/day and quit in 1972. She has seen Dr. Luan Pulling in the past and been evaluated for lung condition, PFTs were reviewed.  Chest x-ray 05/03/2021 was reviewed which shows mild interstitial edema, no effusions.  She carries a diagnosis of chronic diastolic heart failure and uses torsemide on an as-needed basis, lately has not required this, denies significant orthopnea or paroxysmal nocturnal dyspnea   Significant tests/ events reviewed  04/2018 PFTs no airway obstruction with ratio 84, FEV1 1 to 2%, FVC 93%, TLC 100%, DLCO 72%.  ON O on CPAP/2 L oxygen showed desaturation for 54 minutes.  12/2020 ONO on CPAP/5 L oxygen showed no desaturation  09/2019 NPSG TST 340 minutes, AHI 9.2/hour, lowest desaturation 75%, desaturation less than 88% for 5 hours  10/2019 CPAP titration, 243 pounds, CPAP 14 cm  with 2 L oxygen  CT chest 05/2016 no ILD  Past Medical History:  Diagnosis Date   Anemia    Anxiety    Bipolar disorder (HCC)    Bulging lumbar disc    L3-4   Chronic daily headache    Chronic low back pain 09/20/2014   COPD (chronic obstructive pulmonary disease) (HCC)    Degenerative arthritis    Depression    Diabetes mellitus, type II ([place])    Diabetic neuropathy ([place]) 09/16/2018   DM type 2 with diabetic peripheral neuropathy ([place]) 09/20/2014   Dyslipidemia    Dyspnea    Gastroparesis    GERD (gastroesophageal reflux disease)    Heart murmur    History of hiatal hernia    Hypothyroidism    IBS (irritable bowel syndrome)    Memory difficulty 09/20/2014   Morbid obesity ([place])    Neuropathy    Obstructive sleep apnea on CPAP    Restless legs syndrome (RLS) 09/17/2012   Stroke (cerebrum) ([place]) 05/16/2017   Left parietal   Urticaria    Past Surgical History:  Procedure Laterality Date   ABDOMINAL HYSTERECTOMY     ARTHROSCOPY KNEE W/ DRILLING  06/2011   and Decemer of 2013.   Carpel tunnel  1980's   CATARACT EXTRACTION W/PHACO Right 03/21/2017   Procedure: CATARACT EXTRACTION PHACO AND INTRAOCULAR LENS PLACEMENT RIGHT EYE;  Surgeon: Baruch Goldmann, MD;  Location: [facility];  Service: Ophthalmology;  Laterality: Right;  CDE: 2.91  CATARACT EXTRACTION W/PHACO Left 04/18/2017   Procedure: CATARACT EXTRACTION PHACO AND INTRAOCULAR LENS PLACEMENT LEFT EYE;  Surgeon: Baruch Goldmann, MD;  Location: [facility];  Service: Ophthalmology;  Laterality: Left;  left   CHOLECYSTECTOMY     COLONOSCOPY WITH PROPOFOL N/A 10/04/2016   Procedure: COLONOSCOPY WITH PROPOFOL;  Surgeon: Rogene Houston, MD;  [facility];  Service: Endoscopy;  Laterality: N/A;  11:10   CORONARY ARTERY BYPASS GRAFT N/A 08/29/2017   Procedure: CORONARY ARTERY BYPASS GRAFTING (CABG) x 3 WITH ENDOSCOPIC HARVESTING OF RIGHT SAPHENOUS VEIN;  Surgeon: Ivin Poot, MD;  Location: [facility];  Service: Open Heart Surgery;   Laterality: N/A;   ESOPHAGOGASTRODUODENOSCOPY N/A 04/25/2016   Procedure: ESOPHAGOGASTRODUODENOSCOPY (EGD);  Surgeon: Rogene Houston, MD;  [facility];  Service: Endoscopy;  Laterality: N/A;  730   LEFT HEART CATH AND CORONARY ANGIOGRAPHY N/A 08/27/2017   Procedure: LEFT HEART CATH AND CORONARY ANGIOGRAPHY;  Surgeon: Sherren Mocha, MD;  Location: [facility];  Service: Cardiovascular::  pLAD 95% - p-mLAD 50%, ostD1 90% -pD1 80%; ostOM1 90%; rPDA 80%. EF ~50-55% - HK of dital Anterolateral & Apical wall.  - Rec CVTS c/s   OPEN REDUCTION INTERNAL FIXATION (ORIF) HAND Left 04/10/2020   Procedure: OPEN REDUCTION INTERNAL FIXATION (ORIF) HAND, left index finger;  Surgeon: Carole Civil, MD;  Location: [facility];  Service: Orthopedics;  Laterality: Left;  0.045 k wires   RECTAL SURGERY     fissure   SHOULDER SURGERY Left    arthroscopy in March of this year   TEE WITHOUT CARDIOVERSION N/A 08/29/2017   Procedure: TRANSESOPHAGEAL ECHOCARDIOGRAM (TEE);  Surgeon: Prescott Gum, Collier Salina, MD;  Location: [facility];  Service: Open Heart Surgery;  Laterality: N/A;   TRANSTHORACIC ECHOCARDIOGRAM  06/06/2017   Mild to moderate reduced EF 40 and 45%.  Anterior septal, inferoseptal and basal to mid inferior hypokinesis.  GR 1 DD.  No significant valvular lesion    Allergies  Allergen Reactions   Amoxicillin Anaphylaxis and Other (See Comments)    Has patient had a PCN reaction causing immediate rash, facial/tongue/throat swelling, SOB or lightheadedness with hypotension: Yes Has patient had a PCN reaction causing severe rash involving mucus membranes or skin necrosis: Yes Has patient had a PCN reaction that required hospitalization: Yes Has patient had a PCN reaction occurring within the last 10 years: Yes If all of the above answers are "NO", then may proceed with Cephalosporin use.    Hydrocodone Anaphylaxis   Percocet [Oxycodone-Acetaminophen] Other (See Comments)    Causes chest pain  /tightness. Pressure pain around her ribs.   Vancomycin Itching   Depacon [Valproate Sodium]    Depacon [Valproic Acid] Other (See Comments)    Causes falls     Social History   Socioeconomic History   Marital status: Single    Spouse name: Not on file   Number of children: 0   Years of education: 16   Highest education level: Not on file  Occupational History    Employer: [employer]  Tobacco Use   Smoking status: Former    Types: Cigarettes    Quit date: 02/04/1971    Years since quitting: 50.3   Smokeless tobacco: Never   Tobacco comments:    smoked 2 cigarettes a day  Vaping Use   Vaping Use: Never used  Substance and Sexual Activity   Alcohol use: No    Alcohol/week: 0.0 standard drinks   Drug use: No  Sexual activity: Never  Other Topics Concern   Not on file  Social History Narrative   Patient lives at home alone.    Patient has no children.    Patient has her masters in nursing.    Patient is single.    Patient drinks about 2 glasses of tea daily.   Patient is right handed.   Social Determinants of Health   Financial Resource Strain: Not on file  Food Insecurity: Not on file  Transportation Needs: Not on file  Physical Activity: Not on file  Stress: Not on file  Social Connections: Not on file  Intimate Partner Violence: Not on file    Family History  Problem Relation Age of Onset   Hypertension Mother    Lymphoma Mother    Depression Mother    Arthritis Father    Alcohol abuse Father    Hypertension Sister    Cancer Brother        kidney and lung   Alcohol abuse Brother    Alcohol abuse Paternal Uncle    Alcohol abuse Paternal Grandfather    Alcohol abuse Paternal Grandmother    Allergic rhinitis Neg Hx    Angioedema Neg Hx    Asthma Neg Hx    Atopy Neg Hx    Eczema Neg Hx    Immunodeficiency Neg Hx    Urticaria Neg Hx       Review of Systems Shortness of breath with activity Acid heartburn indigestion, Sore  throat Headaches and nasal congestion Anxiety and depression Feet swelling Joint stiffness   Constitutional: negative for anorexia, fevers and sweats  Eyes: negative for irritation, redness and visual disturbance  Ears, nose, mouth, throat, and face: negative for earaches, epistaxis, nasal congestion and sore throat  Respiratory: negative for cough,  sputum and wheezing  Cardiovascular: negative for chest pain, orthopnea, palpitations and syncope  Gastrointestinal: negative for abdominal pain, constipation, diarrhea, melena, nausea and vomiting  Genitourinary:negative for dysuria, frequency and hematuria  Hematologic/lymphatic: negative for bleeding, easy bruising and lymphadenopathy  Musculoskeletal:negative for arthralgias, muscle weakness and stiff joints  Neurological: negative for coordination problems, gait problems, headaches and weakness  Endocrine: negative for diabetic symptoms including polydipsia, polyuria and weight loss     Objective:   Physical Exam  Gen. Pleasant, obese, in no distress, normal affect ENT - no pallor,icterus, no post nasal drip, class 2 airway Neck: No JVD, no thyromegaly, no carotid bruits Lungs: no use of accessory muscles, no dullness to percussion, decreased without rales or rhonchi  Cardiovascular: Rhythm regular, heart sounds  normal, no murmurs or gallops, 1+ peripheral edema Abdomen: soft and non-tender, no hepatosplenomegaly, BS normal. Musculoskeletal: No deformities, no cyanosis or clubbing Neuro:  alert, non focal, no tremors       Assessment & Plan:

## 2021-06-06 NOTE — Assessment & Plan Note (Signed)
She seems to be maintained on CPAP of 14 cm with a full facemask, compliance is questionable. We will check CPAP report from [facility]  Weight loss encouraged, compliance with goal of at least 4-6 hrs every night is the expectation. Advised against medications with sedative side effects Cautioned against driving when sleepy - understanding that sleepiness will vary on a day to day basis

## 2021-06-06 NOTE — Patient Instructions (Addendum)
°  X Check ONO on CPAP /RA  X obtain CPAP report from [facility]

## 2021-06-11 ENCOUNTER — Ambulatory Visit (HOSPITAL_COMMUNITY): Payer: Medicare Other

## 2021-06-12 DIAGNOSIS — I10 Essential (primary) hypertension: Secondary | ICD-10-CM | POA: Diagnosis not present

## 2021-06-12 DIAGNOSIS — E119 Type 2 diabetes mellitus without complications: Secondary | ICD-10-CM | POA: Diagnosis not present

## 2021-06-14 ENCOUNTER — Ambulatory Visit (HOSPITAL_COMMUNITY): Payer: Medicare Other

## 2021-06-21 DIAGNOSIS — I5032 Chronic diastolic (congestive) heart failure: Secondary | ICD-10-CM | POA: Diagnosis not present

## 2021-06-21 DIAGNOSIS — N1832 Chronic kidney disease, stage 3b: Secondary | ICD-10-CM | POA: Diagnosis not present

## 2021-06-21 DIAGNOSIS — E1140 Type 2 diabetes mellitus with diabetic neuropathy, unspecified: Secondary | ICD-10-CM | POA: Diagnosis not present

## 2021-06-21 DIAGNOSIS — F3181 Bipolar II disorder: Secondary | ICD-10-CM | POA: Diagnosis not present

## 2021-06-21 DIAGNOSIS — Z0001 Encounter for general adult medical examination with abnormal findings: Secondary | ICD-10-CM | POA: Diagnosis not present

## 2021-06-21 DIAGNOSIS — E1121 Type 2 diabetes mellitus with diabetic nephropathy: Secondary | ICD-10-CM | POA: Diagnosis not present

## 2021-06-21 DIAGNOSIS — I2510 Atherosclerotic heart disease of native coronary artery without angina pectoris: Secondary | ICD-10-CM | POA: Diagnosis not present

## 2021-06-21 DIAGNOSIS — E039 Hypothyroidism, unspecified: Secondary | ICD-10-CM | POA: Diagnosis not present

## 2021-06-21 DIAGNOSIS — M545 Low back pain, unspecified: Secondary | ICD-10-CM | POA: Diagnosis not present

## 2021-06-21 DIAGNOSIS — D509 Iron deficiency anemia, unspecified: Secondary | ICD-10-CM | POA: Diagnosis not present

## 2021-06-21 DIAGNOSIS — G4733 Obstructive sleep apnea (adult) (pediatric): Secondary | ICD-10-CM | POA: Diagnosis not present

## 2021-06-21 DIAGNOSIS — E782 Mixed hyperlipidemia: Secondary | ICD-10-CM | POA: Diagnosis not present

## 2021-06-25 ENCOUNTER — Ambulatory Visit: Payer: Medicare Other | Admitting: Orthopedic Surgery

## 2021-06-26 ENCOUNTER — Encounter: Payer: Self-pay | Admitting: Neurology

## 2021-06-26 ENCOUNTER — Other Ambulatory Visit: Payer: Self-pay

## 2021-06-26 ENCOUNTER — Other Ambulatory Visit (INDEPENDENT_AMBULATORY_CARE_PROVIDER_SITE_OTHER): Payer: Self-pay | Admitting: Internal Medicine

## 2021-06-26 ENCOUNTER — Ambulatory Visit (HOSPITAL_COMMUNITY)
Admission: RE | Admit: 2021-06-26 | Discharge: 2021-06-26 | Disposition: A | Discharge status: Home / Self Care | Payer: Medicare Other | Source: Ambulatory Visit | Attending: Internal Medicine | Admitting: Internal Medicine

## 2021-06-26 DIAGNOSIS — R112 Nausea with vomiting, unspecified: Secondary | ICD-10-CM | POA: Diagnosis not present

## 2021-06-26 DIAGNOSIS — R1319 Other dysphagia: Secondary | ICD-10-CM | POA: Diagnosis not present

## 2021-06-26 DIAGNOSIS — K224 Dyskinesia of esophagus: Secondary | ICD-10-CM | POA: Diagnosis not present

## 2021-06-26 DIAGNOSIS — R1310 Dysphagia, unspecified: Secondary | ICD-10-CM | POA: Diagnosis not present

## 2021-06-28 ENCOUNTER — Other Ambulatory Visit: Payer: Self-pay | Admitting: Neurosurgery

## 2021-06-28 ENCOUNTER — Encounter: Payer: Self-pay | Admitting: Neurology

## 2021-06-28 DIAGNOSIS — R0683 Snoring: Secondary | ICD-10-CM | POA: Diagnosis not present

## 2021-06-28 DIAGNOSIS — G4730 Sleep apnea, unspecified: Secondary | ICD-10-CM | POA: Diagnosis not present

## 2021-07-02 ENCOUNTER — Other Ambulatory Visit: Payer: Self-pay | Admitting: Neurosurgery

## 2021-07-02 ENCOUNTER — Telehealth: Payer: Self-pay | Admitting: Neurology

## 2021-07-02 NOTE — Telephone Encounter (Signed)
Received a ONO report from [facility]. It appears Dr Elsworth Soho had requested this test to be completed again. I will forward this to Dr Elsworth Soho to ensure that he gets the results.  ? ? ?

## 2021-07-03 NOTE — Telephone Encounter (Signed)
Spoke with patient  regarding ONO results. They verbalized understanding. No further questions.  Nothing further needed at this time.  

## 2021-07-04 NOTE — Progress Notes (Signed)
Surgical Instructions ? ? ? Your procedure is scheduled on 07/12/21. ? Report to [facility] Main Entrance "A" at 10:30 A.M., then check in with the Admitting office. ? Call this number if you have problems the morning of surgery: ? [phone] ? ? If you have any questions prior to your surgery date call [phone]: Open Monday-Friday 8am-4pm ? ? ? Remember: ? Do not eat after midnight the night before your surgery ? ?You may drink clear liquids until 9:30 the morning of your surgery.   ?Clear liquids allowed are: Water, Non-Citrus Juices (without pulp), Carbonated Beverages, Clear Tea, Black Coffee ONLY (NO MILK, CREAM OR POWDERED CREAMER of any kind), and Gatorade ?  ? Take these medicines the morning of surgery with A SIP OF WATER:  ?buPROPion (WELLBUTRIN XL)  ?carvedilol (COREG)  ?gabapentin (NEURONTIN)  ?levothyroxine (SYNTHROID) ?rOPINIRole (REQUIP) ? ?AS NEEDED: ?ALPRAZolam Duanne Moron) ?diphenhydrAMINE (BENADRYL) ?meclizine (ANTIVERT) ?ondansetron [facility]) ?tiZANidine (ZANAFLEX) ?traMADol Veatrice Bourbon) ? ? ?As of today, STOP taking any Aspirin (unless otherwise instructed by your surgeon) Aleve, Naproxen, Ibuprofen, Motrin, Advil, Goody's, BC's, all herbal medications, fish oil, and all vitamins. ? ?WHAT DO I DO ABOUT MY DIABETES MEDICATION? ? ? ?Do not take oral diabetes medicines (pills) the morning of surgery. ? ?DO NOT take JARDIANCE the day before surgery or the day of surgery. ?DO NOT take glipiZIDE (GLUCOTROL XL) the evening before surgery or the day of surgery. ?DO NOT take METFORMIN the day of surgery. ? ? ?HOW TO MANAGE YOUR DIABETES ?BEFORE AND AFTER SURGERY ? ?Why is it important to control my blood sugar before and after surgery? ?Improving blood sugar levels before and after surgery helps healing and can limit problems. ?A way of improving blood sugar control is eating a healthy diet by: ? Eating less sugar and carbohydrates ? Increasing activity/exercise ? Talking with your doctor about reaching your  blood sugar goals ?High blood sugars (greater than 180 mg/dL) can raise your risk of infections and slow your recovery, so you will need to focus on controlling your diabetes during the weeks before surgery. ?Make sure that the doctor who takes care of your diabetes knows about your planned surgery including the date and location. ? ?How do I manage my blood sugar before surgery? ?Check your blood sugar at least 4 times a day, starting 2 days before surgery, to make sure that the level is not too high or low. ? ?Check your blood sugar the morning of your surgery when you wake up and every 2 hours until you get to the Short Stay unit. ? ?If your blood sugar is less than 70 mg/dL, you will need to treat for low blood sugar: ?Do not take insulin. ?Treat a low blood sugar (less than 70 mg/dL) with ? cup of clear juice (cranberry or apple), 4 glucose tablets, OR glucose gel. ?Recheck blood sugar in 15 minutes after treatment (to make sure it is greater than 70 mg/dL). If your blood sugar is not greater than 70 mg/dL on recheck, call [phone] for further instructions. ?Report your blood sugar to the short stay nurse when you get to Short Stay. ? ?If you are admitted to the hospital after surgery: ?Your blood sugar will be checked by the staff and you will probably be given insulin after surgery (instead of oral diabetes medicines) to make sure you have good blood sugar levels. ?The goal for blood sugar control after surgery is 80-180 mg/dL.  ? ?         ?  Do not wear jewelry or makeup ?Do not wear lotions, powders, perfumes or deodorant. ?Do not shave 48 hours prior to surgery.   ?Do not bring valuables to the hospital. ?Do not wear nail polish, gel polish, artificial nails, or any other type of covering on natural nails (fingers and toes) ?If you have artificial nails or gel coating that need to be removed by a nail salon, please have this removed prior to surgery. Artificial nails or gel coating may interfere with  anesthesia's ability to adequately monitor your vital signs. ? ?[facility] is not responsible for any belongings or valuables. .  ? ?Do NOT Smoke (Tobacco/Vaping)  24 hours prior to your procedure ? ?If you use a CPAP at night, you may bring your mask for your overnight stay. ?  ?Contacts, glasses, hearing aids, dentures or partials may not be worn into surgery, please bring cases for these belongings ?  ?For patients admitted to the hospital, discharge time will be determined by your treatment team. ?  ?Patients discharged the day of surgery will not be allowed to drive home, and someone needs to stay with them for 24 hours. ? ? ?SURGICAL WAITING ROOM VISITATION ?Patients having surgery or a procedure in a hospital may have two support people. ?Children under the age of 67 must have an adult with them who is not the patient. ?They may stay in the waiting area during the procedure and may switch out with other visitors. If the patient needs to stay at the hospital during part of their recovery, the visitor guidelines for inpatient rooms apply. ? ?Please refer to the [facility] website for the visitor guidelines for Inpatients (after your surgery is over and you are in a regular room).  ? ? ? ? ? ?Special instructions:   ? ?Oral Hygiene is also important to reduce your risk of infection.  Remember - BRUSH YOUR TEETH THE MORNING OF SURGERY WITH YOUR REGULAR TOOTHPASTE ? ? ?[facility]- Preparing For Surgery ? ?Before surgery, you can play an important role. Because skin is not sterile, your skin needs to be as free of germs as possible. You can reduce the number of germs on your skin by washing with CHG (chlorahexidine gluconate) Soap before surgery.  CHG is an antiseptic cleaner which kills germs and bonds with the skin to continue killing germs even after washing.   ? ? ?Please do not use if you have an allergy to CHG or antibacterial soaps. If your skin becomes reddened/irritated stop using the CHG.  ?Do not shave  (including legs and underarms) for at least 48 hours prior to first CHG shower. It is OK to shave your face. ? ?Please follow these instructions carefully. ?  ? ? Shower the NIGHT BEFORE SURGERY and the MORNING OF SURGERY with CHG Soap.  ? If you chose to wash your hair, wash your hair first as usual with your normal shampoo. After you shampoo, rinse your hair and body thoroughly to remove the shampoo.  Then [employer] and genitals (private parts) with your normal soap and rinse thoroughly to remove soap. ? ?After that Use CHG Soap as you would any other liquid soap. You can apply CHG directly to the skin and wash gently with a scrungie or a clean washcloth.  ? ?Apply the CHG Soap to your body ONLY FROM THE NECK DOWN.  Do not use on open wounds or open sores. Avoid contact with your eyes, ears, mouth and genitals (private parts). Wash Face  and genitals (private parts)  with your normal soap.  ? ?Wash thoroughly, paying special attention to the area where your surgery will be performed. ? ?Thoroughly rinse your body with warm water from the neck down. ? ?DO NOT shower/wash with your normal soap after using and rinsing off the CHG Soap. ? ?Pat yourself dry with a CLEAN TOWEL. ? ?Wear CLEAN PAJAMAS to bed the night before surgery ? ?Place CLEAN SHEETS on your bed the night before your surgery ? ?DO NOT SLEEP WITH PETS. ? ? ?Day of Surgery: ? ?Take a shower with CHG soap. ?Wear Clean/Comfortable clothing the morning of surgery ?Do not apply any deodorants/lotions.   ?Remember to brush your teeth WITH YOUR REGULAR TOOTHPASTE. ? ? ? ?If you received a COVID test during your pre-op visit  it is requested that you wear a mask when out in public, stay away from anyone that may not be feeling well and notify your surgeon if you develop symptoms. If you have been in contact with anyone that has tested positive in the last 10 days please notify you surgeon. ? ?  ?Please read over the following fact sheets that you were given.    ?

## 2021-07-05 ENCOUNTER — Other Ambulatory Visit: Payer: Self-pay

## 2021-07-05 ENCOUNTER — Encounter (HOSPITAL_COMMUNITY): Payer: Self-pay

## 2021-07-05 ENCOUNTER — Encounter (HOSPITAL_COMMUNITY)
Admission: RE | Admit: 2021-07-05 | Discharge: 2021-07-05 | Disposition: A | Discharge status: Home / Self Care | Payer: Medicare Other | Source: Ambulatory Visit | Attending: Neurosurgery | Admitting: Neurosurgery

## 2021-07-05 VITALS — BP 118/56 | Temp 98.1°F | Resp 18 | Ht 60.0 in | Wt 240.4 lb

## 2021-07-05 DIAGNOSIS — Z01818 Encounter for other preprocedural examination: Secondary | ICD-10-CM | POA: Insufficient documentation

## 2021-07-05 DIAGNOSIS — Z9989 Dependence on other enabling machines and devices: Secondary | ICD-10-CM | POA: Diagnosis not present

## 2021-07-05 DIAGNOSIS — D649 Anemia, unspecified: Secondary | ICD-10-CM | POA: Diagnosis not present

## 2021-07-05 DIAGNOSIS — J449 Chronic obstructive pulmonary disease, unspecified: Secondary | ICD-10-CM | POA: Diagnosis not present

## 2021-07-05 DIAGNOSIS — Z8673 Personal history of transient ischemic attack (TIA), and cerebral infarction without residual deficits: Secondary | ICD-10-CM | POA: Diagnosis not present

## 2021-07-05 DIAGNOSIS — E1122 Type 2 diabetes mellitus with diabetic chronic kidney disease: Secondary | ICD-10-CM | POA: Diagnosis not present

## 2021-07-05 DIAGNOSIS — E119 Type 2 diabetes mellitus without complications: Secondary | ICD-10-CM

## 2021-07-05 DIAGNOSIS — I2510 Atherosclerotic heart disease of native coronary artery without angina pectoris: Secondary | ICD-10-CM | POA: Diagnosis not present

## 2021-07-05 DIAGNOSIS — I447 Left bundle-branch block, unspecified: Secondary | ICD-10-CM | POA: Insufficient documentation

## 2021-07-05 DIAGNOSIS — K760 Fatty (change of) liver, not elsewhere classified: Secondary | ICD-10-CM | POA: Insufficient documentation

## 2021-07-05 DIAGNOSIS — Z6841 Body Mass Index (BMI) 40.0 and over, adult: Secondary | ICD-10-CM | POA: Insufficient documentation

## 2021-07-05 DIAGNOSIS — G4733 Obstructive sleep apnea (adult) (pediatric): Secondary | ICD-10-CM | POA: Insufficient documentation

## 2021-07-05 DIAGNOSIS — M5416 Radiculopathy, lumbar region: Secondary | ICD-10-CM | POA: Diagnosis not present

## 2021-07-05 DIAGNOSIS — I5032 Chronic diastolic (congestive) heart failure: Secondary | ICD-10-CM | POA: Diagnosis not present

## 2021-07-05 DIAGNOSIS — R011 Cardiac murmur, unspecified: Secondary | ICD-10-CM | POA: Diagnosis not present

## 2021-07-05 DIAGNOSIS — Z951 Presence of aortocoronary bypass graft: Secondary | ICD-10-CM | POA: Diagnosis not present

## 2021-07-05 HISTORY — DX: Fatty (change of) liver, not elsewhere classified: K76.0

## 2021-07-05 HISTORY — DX: Left bundle-branch block, unspecified: I44.7

## 2021-07-05 HISTORY — DX: Chronic kidney disease, unspecified: N18.9

## 2021-07-05 HISTORY — DX: Atherosclerotic heart disease of native coronary artery without angina pectoris: I25.10

## 2021-07-05 HISTORY — DX: Family history of other specified conditions: Z84.89

## 2021-07-05 HISTORY — DX: Acute myocardial infarction, unspecified: I21.9

## 2021-07-05 LAB — HEMOGLOBIN A1C
Hgb A1c MFr Bld: 7.4 % — ABNORMAL HIGH (ref 4.8–5.6)
Mean Plasma Glucose: 165.68 mg/dL

## 2021-07-05 LAB — TYPE AND SCREEN
ABO/RH(D): AB NEG
Antibody Screen: NEGATIVE

## 2021-07-05 LAB — COMPREHENSIVE METABOLIC PANEL
ALT: 32 U/L (ref 0–44)
AST: 26 U/L (ref 15–41)
Albumin: 3.3 g/dL — ABNORMAL LOW (ref 3.5–5.0)
Alkaline Phosphatase: 58 U/L (ref 38–126)
Anion gap: 6 (ref 5–15)
BUN: 27 mg/dL — ABNORMAL HIGH (ref 8–23)
CO2: 30 mmol/L (ref 22–32)
Calcium: 9 mg/dL (ref 8.9–10.3)
Chloride: 101 mmol/L (ref 98–111)
Creatinine, Ser: 1.7 mg/dL — ABNORMAL HIGH (ref 0.44–1.00)
GFR, Estimated: 32 mL/min — ABNORMAL LOW (ref >60)
Glucose, Bld: 209 mg/dL — ABNORMAL HIGH (ref 70–99)
Potassium: 4.5 mmol/L (ref 3.5–5.1)
Sodium: 137 mmol/L (ref 135–145)
Total Bilirubin: 0.6 mg/dL (ref 0.3–1.2)
Total Protein: 7.1 g/dL (ref 6.5–8.1)

## 2021-07-05 LAB — CBC
HCT: 37.9 % (ref 36.0–46.0)
Hemoglobin: 12.4 g/dL (ref 12.0–15.0)
MCH: 30.6 pg (ref 26.0–34.0)
MCHC: 32.7 g/dL (ref 30.0–36.0)
MCV: 93.6 fL (ref 80.0–100.0)
Platelets: 346 10*3/uL (ref 150–400)
RBC: 4.05 MIL/uL (ref 3.87–5.11)
RDW: 14 % (ref 11.5–15.5)
WBC: 13.1 10*3/uL — ABNORMAL HIGH (ref 4.0–10.5)
nRBC: 0 % (ref 0.0–0.2)

## 2021-07-05 LAB — GLUCOSE, CAPILLARY: Glucose-Capillary: 201 mg/dL — ABNORMAL HIGH (ref 70–99)

## 2021-07-05 LAB — SURGICAL PCR SCREEN
MRSA, PCR: NEGATIVE
Staphylococcus aureus: POSITIVE — AB

## 2021-07-05 NOTE — Progress Notes (Signed)
Surgical Instructions ? ? ? Your procedure is scheduled on Thursday, March 30 ? Report to [facility] Main Entrance "A" at 10:30 A.M., then check in with the Admitting office. ? Call this number if you have problems the morning of surgery: ? [phone] ? ? If you have any questions prior to your surgery date call [phone]: Open Monday-Friday 8am-4pm ? ? ? Remember: ? Do not eat or drink after midnight the night before your surgery ?  ? Take these medicines the morning of surgery with A SIP OF WATER:  ? ?buPROPion (WELLBUTRIN XL)  ?carvedilol (COREG)  ?cetirizine (ZYRTEC)  ?gabapentin (NEURONTIN)  ?levothyroxine (SYNTHROID) ?rOPINIRole (REQUIP) ? ?AS NEEDED: ?ALPRAZolam Duanne Moron) ?diphenhydrAMINE (BENADRYL) ?meclizine (ANTIVERT) ?ondansetron [facility]) ?tiZANidine (ZANAFLEX) ?traMADol Veatrice Bourbon) ? ? ?As of today, STOP taking any Aspirin (unless otherwise instructed by your surgeon) Aleve, Naproxen, Ibuprofen, Motrin, Advil, Goody's, BC's, all herbal medications, fish oil, and all vitamins, diclofenac Sodium (VOLTAREN) 1 % GEL ? ?WHAT DO I DO ABOUT MY DIABETES MEDICATION? ? ? ?Do not take oral diabetes medicines (pills) the morning of surgery. ? ?DO NOT take JARDIANCE the day before surgery or the day of surgery. ?DO NOT take glipiZIDE (GLUCOTROL XL) the evening before surgery or the day of surgery. ?DO NOT take METFORMIN the day of surgery. ? ? ?HOW TO MANAGE YOUR DIABETES ?BEFORE AND AFTER SURGERY ? ?Why is it important to control my blood sugar before and after surgery? ?Improving blood sugar levels before and after surgery helps healing and can limit problems. ?A way of improving blood sugar control is eating a healthy diet by: ? Eating less sugar and carbohydrates ? Increasing activity/exercise ? Talking with your doctor about reaching your blood sugar goals ?High blood sugars (greater than 180 mg/dL) can raise your risk of infections and slow your recovery, so you will need to focus on controlling your diabetes  during the weeks before surgery. ?Make sure that the doctor who takes care of your diabetes knows about your planned surgery including the date and location. ? ?How do I manage my blood sugar before surgery? ?Check your blood sugar at least 4 times a day, starting 2 days before surgery, to make sure that the level is not too high or low. ? ?Check your blood sugar the morning of your surgery when you wake up and every 2 hours until you get to the Short Stay unit. ? ?If your blood sugar is less than 70 mg/dL, you will need to treat for low blood sugar: ?Do not take insulin. ?Treat a low blood sugar (less than 70 mg/dL) with ? cup of clear juice (cranberry or apple), 4 glucose tablets, OR glucose gel. ?Recheck blood sugar in 15 minutes after treatment (to make sure it is greater than 70 mg/dL). If your blood sugar is not greater than 70 mg/dL on recheck, call [phone] for further instructions. ?Report your blood sugar to the short stay nurse when you get to Short Stay. ? ?If you are admitted to the hospital after surgery: ?Your blood sugar will be checked by the staff and you will probably be given insulin after surgery (instead of oral diabetes medicines) to make sure you have good blood sugar levels. ?The goal for blood sugar control after surgery is 80-180 mg/dL.  ? ?         ?Do not wear jewelry or makeup ?Do not wear lotions, powders, perfumes or deodorant. ?Do not shave 48 hours prior to surgery.   ?Do not bring valuables  to the hospital. ?Do not wear nail polish, gel polish, artificial nails, or any other type of covering on natural nails (fingers and toes) ?If you have artificial nails or gel coating that need to be removed by a nail salon, please have this removed prior to surgery. Artificial nails or gel coating may interfere with anesthesia's ability to adequately monitor your vital signs. ? ?[facility] is not responsible for any belongings or valuables. .  ? ?Do NOT Smoke (Tobacco/Vaping)  24 hours  prior to your procedure ? ?If you use a CPAP at night, you may bring your mask for your overnight stay. ?  ?Contacts, glasses, hearing aids, dentures or partials may not be worn into surgery, please bring cases for these belongings ?  ?For patients admitted to the hospital, discharge time will be determined by your treatment team. ?  ?Patients discharged the day of surgery will not be allowed to drive home, and someone needs to stay with them for 24 hours. ? ? ?SURGICAL WAITING ROOM VISITATION ?Patients having surgery or a procedure in a hospital may have two support people. ?Children under the age of 21 must have an adult with them who is not the patient. ?They may stay in the waiting area during the procedure and may switch out with other visitors. If the patient needs to stay at the hospital during part of their recovery, the visitor guidelines for inpatient rooms apply. ? ?Please refer to the [facility] website for the visitor guidelines for Inpatients (after your surgery is over and you are in a regular room).  ? ? ? ? ? ?Special instructions:   ? ?Oral Hygiene is also important to reduce your risk of infection.  Remember - BRUSH YOUR TEETH THE MORNING OF SURGERY WITH YOUR REGULAR TOOTHPASTE ? ? ?- Preparing For Surgery ? ?Before surgery, you can play an important role. Because skin is not sterile, your skin needs to be as free of germs as possible. You can reduce the number of germs on your skin by washing with CHG (chlorahexidine gluconate) Soap before surgery.  CHG is an antiseptic cleaner which kills germs and bonds with the skin to continue killing germs even after washing.   ? ? ?Please do not use if you have an allergy to CHG or antibacterial soaps. If your skin becomes reddened/irritated stop using the CHG.  ?Do not shave (including legs and underarms) for at least 48 hours prior to first CHG shower. It is OK to shave your face. ? ?Please follow these instructions carefully. ?  ? ? Shower the  NIGHT BEFORE SURGERY and the MORNING OF SURGERY with CHG Soap.  ? If you chose to wash your hair, wash your hair first as usual with your normal shampoo. After you shampoo, rinse your hair and body thoroughly to remove the shampoo.  Then [employer] and genitals (private parts) with your normal soap and rinse thoroughly to remove soap. ? ?After that Use CHG Soap as you would any other liquid soap. You can apply CHG directly to the skin and wash gently with a scrungie or a clean washcloth.  ? ?Apply the CHG Soap to your body ONLY FROM THE NECK DOWN.  Do not use on open wounds or open sores. Avoid contact with your eyes, ears, mouth and genitals (private parts). Wash Face and genitals (private parts)  with your normal soap.  ? ?Wash thoroughly, paying special attention to the area where your surgery will be performed. ? ?Thoroughly rinse  your body with warm water from the neck down. ? ?DO NOT shower/wash with your normal soap after using and rinsing off the CHG Soap. ? ?Pat yourself dry with a CLEAN TOWEL. ? ?Wear CLEAN PAJAMAS to bed the night before surgery ? ?Place CLEAN SHEETS on your bed the night before your surgery ? ?DO NOT SLEEP WITH PETS. ? ? ?Day of Surgery: ? ?Take a shower with CHG soap. ?Wear Clean/Comfortable clothing the morning of surgery ?Do not apply any deodorants/lotions.   ?Remember to brush your teeth WITH YOUR REGULAR TOOTHPASTE. ? ? ? ?If you received a COVID test during your pre-op visit  it is requested that you wear a mask when out in public, stay away from anyone that may not be feeling well and notify your surgeon if you develop symptoms. If you have been in contact with anyone that has tested positive in the last 10 days please notify you surgeon. ? ?  ?Please read over the following fact sheets that you were given.   ?

## 2021-07-05 NOTE — Progress Notes (Addendum)
PCP - Emi Belfast- pt stated that clearance note was sent to Dr. Marcello Moores' office from PCP. Left message for Dr. Marcello Moores' office to fax clearance note to PAT.  ?Cardiologist - Dr Harl Bowie ?Pulmonologist- Dr. Kara Mead ?Nephrologist- Ulice Bold, MD ? ?PPM/ICD - denies ?Chest x-ray - 05/03/21 ?EKG - 07/05/2021  ?Stress Test - 04/11/16 ?ECHO - 10/18/20 ?Cardiac Cath - 08/27/17 ? ?Sleep Study - 09/14/19 ?CPAP - pt unsure of settings on CPAP, wears nightly with 2L of oxygen ? ?Fasting Blood Sugar - Patient checks her blood sugar daily. pt reports her blood sugar is normally around 100-110, but the past couple of days it has been around 200. HgbA1c checked in PAT today.  ? ?Aspirin Instructions: Patient stated she was told to stop taking Aspirin by surgeon- last dose on 07/04/21 ? ?ERAS Protcol - NPO order ? ?COVID TEST- N/A, pt denies Covid exposure or symptoms.  ? ?Anesthesia review: yes, cardiac history, Creatinine 1.7- left message on voicemail of surgery scheduler to notify Dr. Marcello Moores to review PAT lab results.  ? ?Patient denies new shortness of breath, fever, cough and chest pain at PAT appointment ? ?All instructions explained to the patient, with a verbal understanding of the material. Patient agrees to go over the instructions while at home for a better understanding. The opportunity to ask questions was provided. ? ? ?

## 2021-07-06 ENCOUNTER — Encounter (HOSPITAL_COMMUNITY): Payer: Self-pay

## 2021-07-06 DIAGNOSIS — M5416 Radiculopathy, lumbar region: Secondary | ICD-10-CM | POA: Diagnosis not present

## 2021-07-06 NOTE — Progress Notes (Signed)
Anesthesia Chart Review: ? ? Case: [id number] Date/Time: 07/12/21 0715  ? Procedure: MIS TLIF, L45 (Right) - 3C  ? Anesthesia type: General  ? Pre-op diagnosis: LUMBAR BACK PAIN WITH RADICULOPATHY AFFECTING RIGHT LOWER EXTREMITY  ? Location: [facility] ROOM 20 / [facility] OR  ? Surgeons: Vallarie Mare, MD  ? ?  ? ? ?DISCUSSION: ?Pt is 71 years old with hx CAD (s/p CABG 2019), LBBB, heart murmur, chronic diastolic HF, stroke, DM, OSA, COPD, anemia, fatty liver, CKD ? ?VS: BP (!) 118/56 (BP Location: Left Arm)   Temp 36.7 ?C (Oral)   Resp 18   Ht 5' (1.524 m)   Wt 109 kg   SpO2 96%   BMI 46.95 kg/m?  ? ?PROVIDERS: ?- PCP is Celene Squibb, MD ? ?- Cardiologist is Carlyle Dolly, MD. Last office visit 05/07/21. Cleared for surgery at acceptable risk 05/27/21 by Almyra Deforest, PA ? ?- Pulmonologist is Kara Mead, MD. Last office visit 06/06/21 ? ?- Nephrologist is Ulice Bold, MD. Last office visit 05/15/21 (notes in care everywhere)  ? ? ?LABS: Labs reviewed: Acceptable for surgery. ?- Cr 1.7. Pt has CKD stage 3b. Recent creatinine in Epic and care everywhere has ranged 1.31-1.56 ?  ?(all labs ordered are listed, but only abnormal results are displayed) ? ?Labs Reviewed  ?SURGICAL PCR SCREEN - Abnormal; Notable for the following components:  ?    Result Value  ? Staphylococcus aureus POSITIVE (*)   ? All other components within normal limits  ?GLUCOSE, CAPILLARY - Abnormal; Notable for the following components:  ? Glucose-Capillary 201 (*)   ? All other components within normal limits  ?CBC - Abnormal; Notable for the following components:  ? WBC 13.1 (*)   ? All other components within normal limits  ?HEMOGLOBIN A1C - Abnormal; Notable for the following components:  ? Hgb A1c MFr Bld 7.4 (*)   ? All other components within normal limits  ?COMPREHENSIVE METABOLIC PANEL - Abnormal; Notable for the following components:  ? Glucose, Bld 209 (*)   ? BUN 27 (*)   ? Creatinine, Ser 1.70 (*)   ? Albumin 3.3 (*)   ? GFR, Estimated 32  (*)   ? All other components within normal limits  ?TYPE AND SCREEN  ? ? ? ?IMAGES: ?CXR 05/03/21:  ?- Mild interstitial edema ? ?Renal US 04/20/21:  ?1. A 14 mm nonobstructing left renal calculus.  No hydronephrosis. ?2. Fatty liver ? ? ?EKG 07/05/21: Sinus rhythm with PACs. LBBB.  ? ? ?CV: ?Echo 10/18/20:  ?1. Markedly abnormal septal motion ? from BBB. Left ventricular ejection fraction, by estimation, is 50 to 55%. The left ventricle has low normal function. The left ventricle has no regional wall motion abnormalities.  ?Left ventricular diastolic parameters were normal.  ?2. Right ventricular systolic function is normal. The right ventricular size is normal.  ?3. The mitral valve is normal in structure. Trivial mitral valve regurgitation. No evidence of mitral stenosis.  ?4. The aortic valve is tricuspid. There is mild calcification of the aortic valve. Aortic valve regurgitation is not visualized. Mild aortic valve sclerosis is present, with no evidence of aortic valve stenosis.  ?5. The inferior vena cava is normal in size with greater than 50% respiratory variability, suggesting right atrial pressure of 3 mmHg.  ?- Comparison(s): Echocardiogram done 02/03/18 showed an EF of 50-55%. ? ? ? ?Past Medical History:  ?Diagnosis Date  ? Anemia   ? Anxiety   ? Bipolar disorder ([place])   ?  Bulging lumbar disc   ? L3-4  ? Chronic daily headache   ? Chronic low back pain 09/20/2014  ? CKD (chronic kidney disease)   ? stage 3  ? COPD (chronic obstructive pulmonary disease) ([place])   ? Coronary artery disease   ? CABG 2019  ? Degenerative arthritis   ? Depression   ? Diabetes mellitus, type II ([place])   ? Diabetic neuropathy ([place]) 09/16/2018  ? DM type 2 with diabetic peripheral neuropathy ([place]) 09/20/2014  ? Dyslipidemia   ? Dyspnea   ? Family history of adverse reaction to anesthesia   ? father had reaction to anesthesia medication per patient "they don't use it anymore"- unsure of reaction  ? Fatty liver   ? per pt report  ?  Gastroparesis   ? GERD (gastroesophageal reflux disease)   ? Heart murmur   ? History of hiatal hernia   ? pt states she no longer has this  ? Hypothyroidism   ? IBS (irritable bowel syndrome)   ? LBBB (left bundle branch block)   ? Memory difficulty 09/20/2014  ? Morbid obesity ([place])   ? Myocardial infarction [facility])   ? per pt- prior to CABG  ? Neuropathy   ? Obstructive sleep apnea on CPAP   ? Restless legs syndrome (RLS) 09/17/2012  ? Stroke (cerebrum) ([place]) 05/16/2017  ? Left parietal  ? Urticaria   ? ? ?Past Surgical History:  ?Procedure Laterality Date  ? ABDOMINAL HYSTERECTOMY    ? ARTHROSCOPY KNEE W/ DRILLING  06/2011  ? and Dece[place] of 2013.  ? Carpel tunnel  1980's  ? CATARACT EXTRACTION W/PHACO Right 03/21/2017  ? Procedure: CATARACT EXTRACTION PHACO AND INTRAOCULAR LENS PLACEMENT RIGHT EYE;  Surgeon: Baruch Goldmann, MD;  Location: [facility] ORS;  Service: Ophthalmology;  Laterality: Right;  CDE: 2.91 ?  ? CATARACT EXTRACTION W/PHACO Left 04/18/2017  ? Procedure: CATARACT EXTRACTION PHACO AND INTRAOCULAR LENS PLACEMENT LEFT EYE;  Surgeon: Baruch Goldmann, MD;  Location: [facility];  Service: Ophthalmology;  Laterality: Left;  left  ? CHOLECYSTECTOMY    ? COLONOSCOPY WITH PROPOFOL N/A 10/04/2016  ? Procedure: COLONOSCOPY WITH PROPOFOL;  Surgeon: Rogene Houston, MD;  Location: [facility] ENDO [facility];  Service: Endoscopy;  Laterality: N/A;  11:10  ? CORONARY ARTERY BYPASS GRAFT N/A 08/29/2017  ? Procedure: CORONARY ARTERY BYPASS GRAFTING (CABG) x 3 WITH ENDOSCOPIC HARVESTING OF RIGHT SAPHENOUS VEIN;  Surgeon: Ivin Poot, MD;  Location: [facility];  Service: Open Heart Surgery;  Laterality: N/A;  ? ESOPHAGOGASTRODUODENOSCOPY N/A 04/25/2016  ? Procedure: ESOPHAGOGASTRODUODENOSCOPY (EGD);  Surgeon: Rogene Houston, MD;  [facility];  Service: Endoscopy;  Laterality: N/A;  730  ? LEFT HEART CATH AND CORONARY ANGIOGRAPHY N/A 08/27/2017  ? Procedure: LEFT HEART CATH AND CORONARY ANGIOGRAPHY;  Surgeon: Sherren Mocha, MD;   Location: [facility] CV LAB;  Service: Cardiovascular::  pLAD 95% - p-mLAD 50%, ostD1 90% -pD1 80%; ostOM1 90%; rPDA 80%. EF ~50-55% - HK of dital Anterolateral & Apical wall.  - Rec CVTS c/s  ? NECK SURGERY    ? pt reports having growth removed from the back of her neck- after 2021  ? OPEN REDUCTION INTERNAL FIXATION (ORIF) HAND Left 04/10/2020  ? Procedure: OPEN REDUCTION INTERNAL FIXATION (ORIF) HAND, left index finger;  Surgeon: Carole Civil, MD;  Location: [facility];  Service: Orthopedics;  Laterality: Left;  0.045 k wires  ? RECTAL SURGERY    ? fissure  ? SHOULDER SURGERY Left   ?  arthroscopy in March of this year  ? TEE WITHOUT CARDIOVERSION N/A 08/29/2017  ? Procedure: TRANSESOPHAGEAL ECHOCARDIOGRAM (TEE);  Surgeon: Prescott Gum, Collier Salina, MD;  Location: [facility];  Service: Open Heart Surgery;  Laterality: N/A;  ? TRANSTHORACIC ECHOCARDIOGRAM  06/06/2017  ? Mild to moderate reduced EF 40 and 45%.  Anterior septal, inferoseptal and basal to mid inferior hypokinesis.  GR 1 DD.  No significant valvular lesion  ? ? ?MEDICATIONS: ? ALPRAZolam (XANAX) 0.25 MG tablet  ? aspirin EC 81 MG tablet  ? buPROPion (WELLBUTRIN XL) 150 MG 24 hr tablet  ? carvedilol (COREG) 6.25 MG tablet  ? cetirizine (ZYRTEC) 10 MG tablet  ? diclofenac Sodium (VOLTAREN) 1 % GEL  ? diphenhydrAMINE (BENADRYL) 25 mg capsule  ? empagliflozin (JARDIANCE) 10 MG TABS tablet  ? EPINEPHrine 0.3 mg/0.3 mL IJ SOAJ injection  ? famotidine (PEPCID) 20 MG tablet  ? ferrous sulfate 325 (65 FE) MG tablet  ? gabapentin (NEURONTIN) 400 MG capsule  ? glipiZIDE (GLUCOTROL XL) 5 MG 24 hr tablet  ? lamoTRIgine (LAMICTAL) 150 MG tablet  ? levothyroxine (SYNTHROID) 112 MCG tablet  ? losartan (COZAAR) 25 MG tablet  ? meclizine (ANTIVERT) 25 MG tablet  ? metFORMIN (GLUCOPHAGE) 500 MG tablet  ? ondansetron (ZOFRAN) 4 MG tablet  ? OVER THE COUNTER MEDICATION  ? pantoprazole (PROTONIX) 40 MG tablet  ? potassium chloride SA (KLOR-CON M) 20 MEQ tablet  ? REPATHA SURECLICK  768 MG/ML SOAJ  ? rOPINIRole (REQUIP) 3 MG tablet  ? tiZANidine (ZANAFLEX) 2 MG tablet  ? torsemide (DEMADEX) 20 MG tablet  ? traMADol (ULTRAM) 50 MG tablet  ? venlafaxine XR (EFFEXOR-XR) 150 MG 24 hr capsule  ?

## 2021-07-06 NOTE — Anesthesia Preprocedure Evaluation (Addendum)
Anesthesia Evaluation  ?Patient identified by MRN, date of birth, ID band ?Patient awake ? ? ? ?Reviewed: ?Allergy & Precautions, NPO status , Patient's Chart, lab work & pertinent test results ? ?History of Anesthesia Complications ?Negative for: history of anesthetic complications ? ?Airway ?Mallampati: II ? ?TM Distance: >3 FB ?Neck ROM: Full ? ? ? Dental ? ?(+) Missing, Dental Advisory Given ?  ?Pulmonary ?sleep apnea and Continuous Positive Airway Pressure Ventilation , COPD, former smoker,  ?  ?Pulmonary exam normal ? ? ? ? ? ? ? Cardiovascular ?hypertension, Pt. on medications and Pt. on home beta blockers ?+ CAD, + Past MI and + CABG (2019)  ?Normal cardiovascular exam ? ? ?Echo 10/18/20: EF 50-55%, no RWMA, normal RVSF, AV sclerosis w/o stenosis ?  ?Neuro/Psych ?Anxiety Depression Bipolar Disorder CVA (2019)   ? GI/Hepatic ?Neg liver ROS, hiatal hernia, GERD  ,  ?Endo/Other  ?diabetes, Type 2, Oral Hypoglycemic AgentsHypothyroidism Morbid obesity ? Renal/GU ?CRFRenal disease (Cr 1.70)  ?negative genitourinary ?  ?Musculoskeletal ? ?(+) Arthritis ,  ? Abdominal ?  ?Peds ? Hematology ?negative hematology ROS ?(+)   ?Anesthesia Other Findings ? ? Reproductive/Obstetrics ? ?  ? ? ? ? ? ? ? ? ? ? ? ? ? ?  ?  ? ? ? ? ? ?Anesthesia Physical ?Anesthesia Plan ? ?ASA: 3 ? ?Anesthesia Plan: General  ? ?Post-op Pain Management: Ketamine IV* and Ofirmev IV (intra-op)*  ? ?Induction: Intravenous ? ?PONV Risk Score and Plan: 3 and Ondansetron, Dexamethasone, Treatment may vary due to age or medical condition and Midazolam ? ?Airway Management Planned: Oral ETT ? ?Additional Equipment: None ? ?Intra-op Plan:  ? ?Post-operative Plan: Extubation in OR ? ?Informed Consent: I have reviewed the patients History and Physical, chart, labs and discussed the procedure including the risks, benefits and alternatives for the proposed anesthesia with the patient or authorized representative who has  indicated his/her understanding and acceptance.  ? ? ? ?Dental advisory given ? ?Plan Discussed with:  ? ?Anesthesia Plan Comments: (See APP note by Durel Salts, FNP )  ? ? ? ?Anesthesia Quick Evaluation ? ?

## 2021-07-09 DIAGNOSIS — Z20822 Contact with and (suspected) exposure to covid-19: Secondary | ICD-10-CM | POA: Diagnosis not present

## 2021-07-11 DIAGNOSIS — Z20822 Contact with and (suspected) exposure to covid-19: Secondary | ICD-10-CM | POA: Diagnosis not present

## 2021-07-12 ENCOUNTER — Inpatient Hospital Stay (HOSPITAL_COMMUNITY)
Admission: RE | Admit: 2021-07-12 | Discharge: 2021-07-17 | DRG: 454 | Disposition: A | Discharge status: Skilled Nursing Facility | Payer: Medicare Other | Attending: Neurosurgery | Admitting: Neurosurgery

## 2021-07-12 ENCOUNTER — Encounter (HOSPITAL_COMMUNITY): Payer: Self-pay

## 2021-07-12 ENCOUNTER — Ambulatory Visit (HOSPITAL_COMMUNITY): Payer: Medicare Other | Admitting: Emergency Medicine

## 2021-07-12 ENCOUNTER — Ambulatory Visit (HOSPITAL_BASED_OUTPATIENT_CLINIC_OR_DEPARTMENT_OTHER): Payer: Medicare Other | Admitting: Certified Registered"

## 2021-07-12 ENCOUNTER — Other Ambulatory Visit: Payer: Self-pay

## 2021-07-12 ENCOUNTER — Encounter (HOSPITAL_COMMUNITY)
Admission: RE | Disposition: A | Discharge status: Skilled Nursing Facility | Payer: Self-pay | Source: Home / Self Care | Attending: Neurosurgery

## 2021-07-12 ENCOUNTER — Ambulatory Visit (HOSPITAL_COMMUNITY): Payer: Medicare Other

## 2021-07-12 DIAGNOSIS — Z8249 Family history of ischemic heart disease and other diseases of the circulatory system: Secondary | ICD-10-CM

## 2021-07-12 DIAGNOSIS — Z6841 Body Mass Index (BMI) 40.0 and over, adult: Secondary | ICD-10-CM | POA: Diagnosis not present

## 2021-07-12 DIAGNOSIS — F419 Anxiety disorder, unspecified: Secondary | ICD-10-CM | POA: Diagnosis present

## 2021-07-12 DIAGNOSIS — E1159 Type 2 diabetes mellitus with other circulatory complications: Secondary | ICD-10-CM | POA: Diagnosis present

## 2021-07-12 DIAGNOSIS — E1122 Type 2 diabetes mellitus with diabetic chronic kidney disease: Secondary | ICD-10-CM | POA: Diagnosis present

## 2021-07-12 DIAGNOSIS — M549 Dorsalgia, unspecified: Secondary | ICD-10-CM | POA: Diagnosis not present

## 2021-07-12 DIAGNOSIS — I130 Hypertensive heart and chronic kidney disease with heart failure and stage 1 through stage 4 chronic kidney disease, or unspecified chronic kidney disease: Secondary | ICD-10-CM | POA: Diagnosis present

## 2021-07-12 DIAGNOSIS — E11649 Type 2 diabetes mellitus with hypoglycemia without coma: Secondary | ICD-10-CM | POA: Diagnosis not present

## 2021-07-12 DIAGNOSIS — I10 Essential (primary) hypertension: Secondary | ICD-10-CM | POA: Diagnosis not present

## 2021-07-12 DIAGNOSIS — M4316 Spondylolisthesis, lumbar region: Secondary | ICD-10-CM | POA: Diagnosis present

## 2021-07-12 DIAGNOSIS — Z951 Presence of aortocoronary bypass graft: Secondary | ICD-10-CM

## 2021-07-12 DIAGNOSIS — Z811 Family history of alcohol abuse and dependence: Secondary | ICD-10-CM

## 2021-07-12 DIAGNOSIS — I5042 Chronic combined systolic (congestive) and diastolic (congestive) heart failure: Secondary | ICD-10-CM | POA: Diagnosis present

## 2021-07-12 DIAGNOSIS — G4734 Idiopathic sleep related nonobstructive alveolar hypoventilation: Secondary | ICD-10-CM | POA: Diagnosis not present

## 2021-07-12 DIAGNOSIS — I252 Old myocardial infarction: Secondary | ICD-10-CM

## 2021-07-12 DIAGNOSIS — I2510 Atherosclerotic heart disease of native coronary artery without angina pectoris: Secondary | ICD-10-CM | POA: Diagnosis not present

## 2021-07-12 DIAGNOSIS — F418 Other specified anxiety disorders: Secondary | ICD-10-CM | POA: Diagnosis not present

## 2021-07-12 DIAGNOSIS — M170 Bilateral primary osteoarthritis of knee: Secondary | ICD-10-CM | POA: Diagnosis present

## 2021-07-12 DIAGNOSIS — J449 Chronic obstructive pulmonary disease, unspecified: Secondary | ICD-10-CM | POA: Diagnosis present

## 2021-07-12 DIAGNOSIS — I129 Hypertensive chronic kidney disease with stage 1 through stage 4 chronic kidney disease, or unspecified chronic kidney disease: Secondary | ICD-10-CM

## 2021-07-12 DIAGNOSIS — K3184 Gastroparesis: Secondary | ICD-10-CM | POA: Diagnosis present

## 2021-07-12 DIAGNOSIS — T465X5A Adverse effect of other antihypertensive drugs, initial encounter: Secondary | ICD-10-CM | POA: Diagnosis not present

## 2021-07-12 DIAGNOSIS — M5416 Radiculopathy, lumbar region: Secondary | ICD-10-CM | POA: Diagnosis not present

## 2021-07-12 DIAGNOSIS — Z9989 Dependence on other enabling machines and devices: Secondary | ICD-10-CM | POA: Diagnosis not present

## 2021-07-12 DIAGNOSIS — K760 Fatty (change of) liver, not elsewhere classified: Secondary | ICD-10-CM | POA: Diagnosis present

## 2021-07-12 DIAGNOSIS — F32A Depression, unspecified: Secondary | ICD-10-CM | POA: Diagnosis present

## 2021-07-12 DIAGNOSIS — N1832 Chronic kidney disease, stage 3b: Secondary | ICD-10-CM

## 2021-07-12 DIAGNOSIS — G894 Chronic pain syndrome: Secondary | ICD-10-CM | POA: Diagnosis not present

## 2021-07-12 DIAGNOSIS — T40605A Adverse effect of unspecified narcotics, initial encounter: Secondary | ICD-10-CM | POA: Diagnosis not present

## 2021-07-12 DIAGNOSIS — Z888 Allergy status to other drugs, medicaments and biological substances status: Secondary | ICD-10-CM

## 2021-07-12 DIAGNOSIS — I429 Cardiomyopathy, unspecified: Secondary | ICD-10-CM | POA: Diagnosis present

## 2021-07-12 DIAGNOSIS — B379 Candidiasis, unspecified: Secondary | ICD-10-CM | POA: Diagnosis not present

## 2021-07-12 DIAGNOSIS — M48061 Spinal stenosis, lumbar region without neurogenic claudication: Secondary | ICD-10-CM | POA: Diagnosis present

## 2021-07-12 DIAGNOSIS — E039 Hypothyroidism, unspecified: Secondary | ICD-10-CM | POA: Diagnosis not present

## 2021-07-12 DIAGNOSIS — I5032 Chronic diastolic (congestive) heart failure: Secondary | ICD-10-CM | POA: Diagnosis not present

## 2021-07-12 DIAGNOSIS — R2689 Other abnormalities of gait and mobility: Secondary | ICD-10-CM | POA: Diagnosis not present

## 2021-07-12 DIAGNOSIS — Z818 Family history of other mental and behavioral disorders: Secondary | ICD-10-CM

## 2021-07-12 DIAGNOSIS — I959 Hypotension, unspecified: Secondary | ICD-10-CM | POA: Diagnosis not present

## 2021-07-12 DIAGNOSIS — E662 Morbid (severe) obesity with alveolar hypoventilation: Secondary | ICD-10-CM | POA: Diagnosis present

## 2021-07-12 DIAGNOSIS — Z7401 Bed confinement status: Secondary | ICD-10-CM | POA: Diagnosis not present

## 2021-07-12 DIAGNOSIS — Z48811 Encounter for surgical aftercare following surgery on the nervous system: Secondary | ICD-10-CM | POA: Diagnosis not present

## 2021-07-12 DIAGNOSIS — Z79890 Hormone replacement therapy (postmenopausal): Secondary | ICD-10-CM

## 2021-07-12 DIAGNOSIS — M6281 Muscle weakness (generalized): Secondary | ICD-10-CM | POA: Diagnosis not present

## 2021-07-12 DIAGNOSIS — Z880 Allergy status to penicillin: Secondary | ICD-10-CM

## 2021-07-12 DIAGNOSIS — E782 Mixed hyperlipidemia: Secondary | ICD-10-CM | POA: Diagnosis present

## 2021-07-12 DIAGNOSIS — M4726 Other spondylosis with radiculopathy, lumbar region: Secondary | ICD-10-CM | POA: Diagnosis present

## 2021-07-12 DIAGNOSIS — E119 Type 2 diabetes mellitus without complications: Secondary | ICD-10-CM | POA: Diagnosis not present

## 2021-07-12 DIAGNOSIS — Z79899 Other long term (current) drug therapy: Secondary | ICD-10-CM

## 2021-07-12 DIAGNOSIS — Z885 Allergy status to narcotic agent status: Secondary | ICD-10-CM

## 2021-07-12 DIAGNOSIS — E785 Hyperlipidemia, unspecified: Secondary | ICD-10-CM | POA: Diagnosis not present

## 2021-07-12 DIAGNOSIS — L304 Erythema intertrigo: Secondary | ICD-10-CM | POA: Diagnosis not present

## 2021-07-12 DIAGNOSIS — Z7982 Long term (current) use of aspirin: Secondary | ICD-10-CM

## 2021-07-12 DIAGNOSIS — Z751 Person awaiting admission to adequate facility elsewhere: Secondary | ICD-10-CM

## 2021-07-12 DIAGNOSIS — Z8673 Personal history of transient ischemic attack (TIA), and cerebral infarction without residual deficits: Secondary | ICD-10-CM | POA: Diagnosis not present

## 2021-07-12 DIAGNOSIS — Z87891 Personal history of nicotine dependence: Secondary | ICD-10-CM

## 2021-07-12 DIAGNOSIS — Z807 Family history of other malignant neoplasms of lymphoid, hematopoietic and related tissues: Secondary | ICD-10-CM

## 2021-07-12 DIAGNOSIS — K219 Gastro-esophageal reflux disease without esophagitis: Secondary | ICD-10-CM | POA: Diagnosis present

## 2021-07-12 DIAGNOSIS — F319 Bipolar disorder, unspecified: Secondary | ICD-10-CM | POA: Diagnosis not present

## 2021-07-12 DIAGNOSIS — R531 Weakness: Secondary | ICD-10-CM | POA: Diagnosis not present

## 2021-07-12 DIAGNOSIS — Z7984 Long term (current) use of oral hypoglycemic drugs: Secondary | ICD-10-CM

## 2021-07-12 DIAGNOSIS — E1143 Type 2 diabetes mellitus with diabetic autonomic (poly)neuropathy: Secondary | ICD-10-CM | POA: Diagnosis present

## 2021-07-12 DIAGNOSIS — Z8261 Family history of arthritis: Secondary | ICD-10-CM

## 2021-07-12 DIAGNOSIS — R1319 Other dysphagia: Secondary | ICD-10-CM | POA: Diagnosis not present

## 2021-07-12 DIAGNOSIS — E1169 Type 2 diabetes mellitus with other specified complication: Secondary | ICD-10-CM | POA: Diagnosis present

## 2021-07-12 DIAGNOSIS — Z881 Allergy status to other antibiotic agents status: Secondary | ICD-10-CM

## 2021-07-12 DIAGNOSIS — E1140 Type 2 diabetes mellitus with diabetic neuropathy, unspecified: Secondary | ICD-10-CM | POA: Diagnosis not present

## 2021-07-12 DIAGNOSIS — G4733 Obstructive sleep apnea (adult) (pediatric): Secondary | ICD-10-CM | POA: Diagnosis not present

## 2021-07-12 DIAGNOSIS — M4326 Fusion of spine, lumbar region: Secondary | ICD-10-CM | POA: Diagnosis not present

## 2021-07-12 HISTORY — PX: TRANSFORAMINAL LUMBAR INTERBODY FUSION W/ MIS 1 LEVEL: SHX6145

## 2021-07-12 LAB — GLUCOSE, CAPILLARY
Glucose-Capillary: 138 mg/dL — ABNORMAL HIGH (ref 70–99)
Glucose-Capillary: 171 mg/dL — ABNORMAL HIGH (ref 70–99)
Glucose-Capillary: 203 mg/dL — ABNORMAL HIGH (ref 70–99)
Glucose-Capillary: 186 mg/dL — ABNORMAL HIGH (ref 70–99)

## 2021-07-12 SURGERY — MINIMALLY INVASIVE (MIS) TRANSFORAMINAL LUMBAR INTERBODY FUSION (TLIF) 1 LEVEL
Anesthesia: General | Laterality: Right

## 2021-07-12 MED ORDER — LIDOCAINE 2% (20 MG/ML) 5 ML SYRINGE
INTRAMUSCULAR | Status: DC | PRN
  Administered 2021-07-12: 100 mg via INTRAVENOUS

## 2021-07-12 MED ORDER — PHENOL 1.4 % MT LIQD: 1.0000 | OROMUCOSAL | Status: DC | PRN

## 2021-07-12 MED ORDER — NYSTATIN 100000 UNIT/GM EX CREA
TOPICAL_CREAM | Freq: Two times a day (BID) | CUTANEOUS | Status: DC
  Filled 2021-07-12: qty 30

## 2021-07-12 MED ORDER — VENLAFAXINE HCL ER 75 MG PO CP24
300.0000 mg | ORAL_CAPSULE | Freq: Every day | ORAL | Status: DC
Start: 2021-07-12 — End: 2021-07-17
  Administered 2021-07-12 – 2021-07-16 (×5): 300 mg via ORAL
  Filled 2021-07-12 (×5): qty 4

## 2021-07-12 MED ORDER — ALPRAZOLAM 0.25 MG PO TABS
0.2500 mg | ORAL_TABLET | Freq: Every evening | ORAL | Status: DC | PRN
  Administered 2021-07-12 – 2021-07-13 (×2): 0.25 mg via ORAL
  Filled 2021-07-12 (×2): qty 1

## 2021-07-12 MED ORDER — POTASSIUM CHLORIDE CRYS ER 20 MEQ PO TBCR: 20.0000 meq | EXTENDED_RELEASE_TABLET | Freq: Every day | ORAL | Status: DC | PRN

## 2021-07-12 MED ORDER — ACETAMINOPHEN 650 MG RE SUPP: 650.0000 mg | RECTAL | Status: DC | PRN

## 2021-07-12 MED ORDER — TRAMADOL HCL 50 MG PO TABS
100.0000 mg | ORAL_TABLET | Freq: Four times a day (QID) | ORAL | Status: DC | PRN
  Administered 2021-07-12 – 2021-07-16 (×7): 100 mg via ORAL
  Filled 2021-07-12 (×8): qty 2

## 2021-07-12 MED ORDER — CHLORHEXIDINE GLUCONATE 0.12 % MT SOLN
OROMUCOSAL | Status: AC
  Administered 2021-07-12: 15 mL via OROMUCOSAL
  Filled 2021-07-12: qty 15

## 2021-07-12 MED ORDER — GABAPENTIN 400 MG PO CAPS: 400.0000 mg | ORAL_CAPSULE | ORAL | Status: DC

## 2021-07-12 MED ORDER — SODIUM CHLORIDE 0.9% FLUSH
3.0000 mL | Freq: Two times a day (BID) | INTRAVENOUS | Status: DC
  Administered 2021-07-12 – 2021-07-16 (×6): 3 mL via INTRAVENOUS

## 2021-07-12 MED ORDER — OXYCODONE HCL 5 MG PO TABS: 5.0000 mg | ORAL_TABLET | Freq: Once | ORAL | Status: DC | PRN

## 2021-07-12 MED ORDER — ACETAMINOPHEN 325 MG PO TABS
650.0000 mg | ORAL_TABLET | ORAL | Status: DC | PRN
  Administered 2021-07-15: 650 mg via ORAL
  Filled 2021-07-12 (×3): qty 2

## 2021-07-12 MED ORDER — TIZANIDINE HCL 4 MG PO TABS
2.0000 mg | ORAL_TABLET | Freq: Three times a day (TID) | ORAL | Status: DC | PRN
  Administered 2021-07-12 – 2021-07-13 (×3): 2 mg via ORAL
  Filled 2021-07-12 (×2): qty 1

## 2021-07-12 MED ORDER — OXYCODONE HCL 5 MG/5ML PO SOLN: 5.0000 mg | Freq: Once | ORAL | Status: DC | PRN

## 2021-07-12 MED ORDER — CHLORHEXIDINE GLUCONATE CLOTH 2 % EX PADS
6.0000 | MEDICATED_PAD | Freq: Once | CUTANEOUS | Status: DC
  Administered 2021-07-12: 6 via TOPICAL

## 2021-07-12 MED ORDER — HYDROMORPHONE HCL 1 MG/ML IJ SOLN
INTRAMUSCULAR | Status: AC
  Filled 2021-07-12: qty 1

## 2021-07-12 MED ORDER — GELATIN ABSORBABLE MT POWD
OROMUCOSAL | Status: DC | PRN
  Administered 2021-07-12: 5 mL via TOPICAL

## 2021-07-12 MED ORDER — INSULIN ASPART 100 UNIT/ML IJ SOLN
0.0000 [IU] | Freq: Three times a day (TID) | INTRAMUSCULAR | Status: DC
  Administered 2021-07-13: 3 [IU] via SUBCUTANEOUS
  Administered 2021-07-13: 2 [IU] via SUBCUTANEOUS
  Administered 2021-07-13 – 2021-07-16 (×3): 3 [IU] via SUBCUTANEOUS
  Administered 2021-07-16: 2 [IU] via SUBCUTANEOUS

## 2021-07-12 MED ORDER — DEXAMETHASONE SODIUM PHOSPHATE 10 MG/ML IJ SOLN
INTRAMUSCULAR | Status: DC | PRN
  Administered 2021-07-12: 4 mg via INTRAVENOUS

## 2021-07-12 MED ORDER — ALBUMIN HUMAN 5 % IV SOLN: INTRAVENOUS | Status: DC | PRN

## 2021-07-12 MED ORDER — ONDANSETRON HCL 4 MG PO TABS: 4.0000 mg | ORAL_TABLET | Freq: Four times a day (QID) | ORAL | Status: DC | PRN

## 2021-07-12 MED ORDER — ACETAMINOPHEN 10 MG/ML IV SOLN
INTRAVENOUS | Status: DC | PRN
  Administered 2021-07-12: 1000 mg via INTRAVENOUS

## 2021-07-12 MED ORDER — GABAPENTIN 400 MG PO CAPS
400.0000 mg | ORAL_CAPSULE | Freq: Every day | ORAL | Status: DC
  Administered 2021-07-13 – 2021-07-17 (×5): 400 mg via ORAL
  Filled 2021-07-12 (×5): qty 1

## 2021-07-12 MED ORDER — MENTHOL 3 MG MT LOZG
1.0000 | LOZENGE | OROMUCOSAL | Status: DC | PRN
  Filled 2021-07-12: qty 9

## 2021-07-12 MED ORDER — EVOLOCUMAB 140 MG/ML ~~LOC~~ SOAJ: 140.0000 mg | SUBCUTANEOUS | Status: DC

## 2021-07-12 MED ORDER — PHENYLEPHRINE HCL-NACL 20-0.9 MG/250ML-% IV SOLN
INTRAVENOUS | Status: DC | PRN
  Administered 2021-07-12: 50 ug/min via INTRAVENOUS

## 2021-07-12 MED ORDER — 0.9 % SODIUM CHLORIDE (POUR BTL) OPTIME
TOPICAL | Status: DC | PRN
  Administered 2021-07-12: 1000 mL

## 2021-07-12 MED ORDER — AMISULPRIDE (ANTIEMETIC) 5 MG/2ML IV SOLN: 10.0000 mg | Freq: Once | INTRAVENOUS | Status: DC | PRN

## 2021-07-12 MED ORDER — VANCOMYCIN HCL 1250 MG/250ML IV SOLN
1250.0000 mg | Freq: Once | INTRAVENOUS | Status: AC
  Administered 2021-07-13: 1250 mg via INTRAVENOUS
  Filled 2021-07-12: qty 250

## 2021-07-12 MED ORDER — EMPAGLIFLOZIN 10 MG PO TABS
10.0000 mg | ORAL_TABLET | Freq: Every day | ORAL | Status: DC
  Administered 2021-07-13 – 2021-07-17 (×5): 10 mg via ORAL
  Filled 2021-07-12 (×6): qty 1

## 2021-07-12 MED ORDER — THROMBIN 5000 UNITS EX SOLR
CUTANEOUS | Status: AC
  Filled 2021-07-12: qty 5000

## 2021-07-12 MED ORDER — BUPROPION HCL ER (XL) 150 MG PO TB24
150.0000 mg | ORAL_TABLET | Freq: Every morning | ORAL | Status: DC
  Administered 2021-07-13 – 2021-07-17 (×5): 150 mg via ORAL
  Filled 2021-07-12 (×5): qty 1

## 2021-07-12 MED ORDER — PROPOFOL 10 MG/ML IV BOLUS
INTRAVENOUS | Status: AC
  Filled 2021-07-12: qty 20

## 2021-07-12 MED ORDER — SUGAMMADEX SODIUM 200 MG/2ML IV SOLN
INTRAVENOUS | Status: DC | PRN
Start: 2021-07-12 — End: 2021-07-12
  Administered 2021-07-12: 400 mg via INTRAVENOUS

## 2021-07-12 MED ORDER — HYDROMORPHONE HCL 1 MG/ML IJ SOLN
0.5000 mg | INTRAMUSCULAR | Status: DC | PRN
  Administered 2021-07-12 – 2021-07-13 (×3): 0.5 mg via INTRAVENOUS
  Filled 2021-07-12 (×5): qty 0.5

## 2021-07-12 MED ORDER — POLYETHYLENE GLYCOL 3350 17 G PO PACK: 17.0000 g | PACK | Freq: Every day | ORAL | Status: DC | PRN

## 2021-07-12 MED ORDER — SODIUM CHLORIDE 0.9 % IV SOLN: 250.0000 mL | INTRAVENOUS | Status: DC

## 2021-07-12 MED ORDER — MIDAZOLAM HCL 5 MG/5ML IJ SOLN
INTRAMUSCULAR | Status: DC | PRN
  Administered 2021-07-12: 2 mg via INTRAVENOUS

## 2021-07-12 MED ORDER — LIDOCAINE-EPINEPHRINE 1 %-1:100000 IJ SOLN
INTRAMUSCULAR | Status: DC | PRN
  Administered 2021-07-12: 7.5 mL

## 2021-07-12 MED ORDER — CHLORHEXIDINE GLUCONATE 0.12 % MT SOLN: 15.0000 mL | Freq: Once | OROMUCOSAL | Status: AC

## 2021-07-12 MED ORDER — TORSEMIDE 20 MG PO TABS
20.0000 mg | ORAL_TABLET | Freq: Every day | ORAL | Status: DC | PRN
  Administered 2021-07-13 – 2021-07-14 (×2): 20 mg via ORAL
  Filled 2021-07-12 (×2): qty 1

## 2021-07-12 MED ORDER — ONDANSETRON HCL 4 MG/2ML IJ SOLN
INTRAMUSCULAR | Status: DC | PRN
  Administered 2021-07-12: 4 mg via INTRAVENOUS

## 2021-07-12 MED ORDER — ROCURONIUM BROMIDE 10 MG/ML (PF) SYRINGE
PREFILLED_SYRINGE | INTRAVENOUS | Status: DC | PRN
  Administered 2021-07-12: 20 mg via INTRAVENOUS
  Administered 2021-07-12: 50 mg via INTRAVENOUS
  Administered 2021-07-12: 100 mg via INTRAVENOUS

## 2021-07-12 MED ORDER — EPHEDRINE SULFATE-NACL 50-0.9 MG/10ML-% IV SOSY
PREFILLED_SYRINGE | INTRAVENOUS | Status: DC | PRN
  Administered 2021-07-12: 5 mg via INTRAVENOUS
  Administered 2021-07-12: 10 mg via INTRAVENOUS

## 2021-07-12 MED ORDER — LORATADINE 10 MG PO TABS: 10.0000 mg | ORAL_TABLET | Freq: Every day | ORAL | Status: DC | PRN

## 2021-07-12 MED ORDER — FENTANYL CITRATE (PF) 250 MCG/5ML IJ SOLN
INTRAMUSCULAR | Status: DC | PRN
  Administered 2021-07-12: 25 ug via INTRAVENOUS
  Administered 2021-07-12: 100 ug via INTRAVENOUS
  Administered 2021-07-12: 50 ug via INTRAVENOUS
  Administered 2021-07-12: 25 ug via INTRAVENOUS
  Administered 2021-07-12: 50 ug via INTRAVENOUS

## 2021-07-12 MED ORDER — PROPOFOL 10 MG/ML IV BOLUS
INTRAVENOUS | Status: DC | PRN
  Administered 2021-07-12: 80 mg via INTRAVENOUS

## 2021-07-12 MED ORDER — METHOCARBAMOL 1000 MG/10ML IJ SOLN: 500.0000 mg | Freq: Four times a day (QID) | INTRAVENOUS | Status: DC | PRN

## 2021-07-12 MED ORDER — FLEET ENEMA 7-19 GM/118ML RE ENEM: 1.0000 | ENEMA | Freq: Once | RECTAL | Status: DC | PRN

## 2021-07-12 MED ORDER — VITAMIN D (ERGOCALCIFEROL) 1.25 MG (50000 UNIT) PO CAPS: 50000.0000 [IU] | ORAL_CAPSULE | ORAL | Status: DC

## 2021-07-12 MED ORDER — MECLIZINE HCL 12.5 MG PO TABS: 25.0000 mg | ORAL_TABLET | Freq: Three times a day (TID) | ORAL | Status: DC | PRN

## 2021-07-12 MED ORDER — BUPIVACAINE LIPOSOME 1.3 % IJ SUSP
INTRAMUSCULAR | Status: DC | PRN
  Administered 2021-07-12: 20 mL

## 2021-07-12 MED ORDER — MIDAZOLAM HCL 2 MG/2ML IJ SOLN
INTRAMUSCULAR | Status: AC
  Filled 2021-07-12: qty 2

## 2021-07-12 MED ORDER — ACETAMINOPHEN 10 MG/ML IV SOLN
INTRAVENOUS | Status: AC
  Filled 2021-07-12: qty 100

## 2021-07-12 MED ORDER — INSULIN ASPART 100 UNIT/ML IJ SOLN: 0.0000 [IU] | INTRAMUSCULAR | Status: DC | PRN

## 2021-07-12 MED ORDER — POTASSIUM CHLORIDE IN NACL 20-0.9 MEQ/L-% IV SOLN
INTRAVENOUS | Status: DC
  Filled 2021-07-12: qty 1000

## 2021-07-12 MED ORDER — KETAMINE HCL 50 MG/5ML IJ SOSY
PREFILLED_SYRINGE | INTRAMUSCULAR | Status: AC
  Filled 2021-07-12: qty 5

## 2021-07-12 MED ORDER — HYDROMORPHONE HCL 1 MG/ML IJ SOLN
0.2500 mg | INTRAMUSCULAR | Status: DC | PRN
  Administered 2021-07-12 (×3): 0.5 mg via INTRAVENOUS

## 2021-07-12 MED ORDER — GLIPIZIDE ER 5 MG PO TB24
5.0000 mg | ORAL_TABLET | Freq: Two times a day (BID) | ORAL | Status: DC
  Administered 2021-07-12 – 2021-07-16 (×7): 5 mg via ORAL
  Filled 2021-07-12 (×10): qty 1

## 2021-07-12 MED ORDER — PHENYLEPHRINE 40 MCG/ML (10ML) SYRINGE FOR IV PUSH (FOR BLOOD PRESSURE SUPPORT)
PREFILLED_SYRINGE | INTRAVENOUS | Status: DC | PRN
  Administered 2021-07-12 (×2): 80 ug via INTRAVENOUS
  Administered 2021-07-12: 120 ug via INTRAVENOUS
  Administered 2021-07-12: 80 ug via INTRAVENOUS
  Administered 2021-07-12: 40 ug via INTRAVENOUS

## 2021-07-12 MED ORDER — BUPIVACAINE HCL (PF) 0.5 % IJ SOLN
INTRAMUSCULAR | Status: DC | PRN
  Administered 2021-07-12: 7.5 mL

## 2021-07-12 MED ORDER — LACTATED RINGERS IV SOLN: INTRAVENOUS | Status: DC

## 2021-07-12 MED ORDER — BUPIVACAINE LIPOSOME 1.3 % IJ SUSP
INTRAMUSCULAR | Status: AC
  Filled 2021-07-12: qty 20

## 2021-07-12 MED ORDER — LIDOCAINE-EPINEPHRINE 1 %-1:100000 IJ SOLN
INTRAMUSCULAR | Status: AC
  Filled 2021-07-12: qty 1

## 2021-07-12 MED ORDER — GABAPENTIN 400 MG PO CAPS
800.0000 mg | ORAL_CAPSULE | Freq: Every day | ORAL | Status: DC
  Administered 2021-07-12 – 2021-07-16 (×5): 800 mg via ORAL
  Filled 2021-07-12 (×5): qty 2

## 2021-07-12 MED ORDER — DIPHENHYDRAMINE HCL 50 MG/ML IJ SOLN
INTRAMUSCULAR | Status: DC | PRN
  Administered 2021-07-12: 12.5 mg via INTRAVENOUS

## 2021-07-12 MED ORDER — LEVOTHYROXINE SODIUM 112 MCG PO TABS
112.0000 ug | ORAL_TABLET | Freq: Every day | ORAL | Status: DC
  Administered 2021-07-13 – 2021-07-17 (×5): 112 ug via ORAL
  Filled 2021-07-12 (×5): qty 1

## 2021-07-12 MED ORDER — INSULIN ASPART 100 UNIT/ML IJ SOLN: 0.0000 [IU] | Freq: Every day | INTRAMUSCULAR | Status: DC

## 2021-07-12 MED ORDER — DIPHENHYDRAMINE HCL 25 MG PO CAPS: 25.0000 mg | ORAL_CAPSULE | ORAL | Status: DC | PRN

## 2021-07-12 MED ORDER — VANCOMYCIN HCL IN DEXTROSE 1-5 GM/200ML-% IV SOLN
INTRAVENOUS | Status: AC
  Filled 2021-07-12: qty 200

## 2021-07-12 MED ORDER — CARVEDILOL 6.25 MG PO TABS
6.2500 mg | ORAL_TABLET | Freq: Two times a day (BID) | ORAL | Status: DC
  Administered 2021-07-13 – 2021-07-14 (×3): 6.25 mg via ORAL
  Filled 2021-07-12 (×3): qty 1

## 2021-07-12 MED ORDER — FAMOTIDINE 20 MG PO TABS
20.0000 mg | ORAL_TABLET | Freq: Every day | ORAL | Status: DC
  Administered 2021-07-12 – 2021-07-16 (×3): 20 mg via ORAL
  Filled 2021-07-12 (×3): qty 1

## 2021-07-12 MED ORDER — ORAL CARE MOUTH RINSE: 15.0000 mL | Freq: Once | OROMUCOSAL | Status: AC

## 2021-07-12 MED ORDER — ONDANSETRON HCL 4 MG/2ML IJ SOLN: 4.0000 mg | Freq: Once | INTRAMUSCULAR | Status: DC | PRN

## 2021-07-12 MED ORDER — DOCUSATE SODIUM 100 MG PO CAPS
100.0000 mg | ORAL_CAPSULE | Freq: Two times a day (BID) | ORAL | Status: DC
  Administered 2021-07-12 – 2021-07-17 (×8): 100 mg via ORAL
  Filled 2021-07-12 (×8): qty 1

## 2021-07-12 MED ORDER — METHOCARBAMOL 500 MG PO TABS
500.0000 mg | ORAL_TABLET | Freq: Four times a day (QID) | ORAL | Status: DC | PRN
  Administered 2021-07-12 – 2021-07-16 (×10): 500 mg via ORAL
  Filled 2021-07-12 (×10): qty 1

## 2021-07-12 MED ORDER — FENTANYL CITRATE (PF) 250 MCG/5ML IJ SOLN
INTRAMUSCULAR | Status: AC
  Filled 2021-07-12: qty 5

## 2021-07-12 MED ORDER — PANTOPRAZOLE SODIUM 40 MG PO TBEC
40.0000 mg | DELAYED_RELEASE_TABLET | Freq: Every day | ORAL | Status: DC
  Administered 2021-07-12 – 2021-07-16 (×5): 40 mg via ORAL
  Filled 2021-07-12 (×5): qty 1

## 2021-07-12 MED ORDER — BUPIVACAINE HCL (PF) 0.5 % IJ SOLN
INTRAMUSCULAR | Status: AC
  Filled 2021-07-12: qty 30

## 2021-07-12 MED ORDER — ENOXAPARIN SODIUM 40 MG/0.4ML IJ SOSY
40.0000 mg | PREFILLED_SYRINGE | INTRAMUSCULAR | Status: DC
  Administered 2021-07-13 – 2021-07-17 (×5): 40 mg via SUBCUTANEOUS
  Filled 2021-07-12 (×5): qty 0.4

## 2021-07-12 MED ORDER — METFORMIN HCL 500 MG PO TABS
500.0000 mg | ORAL_TABLET | Freq: Two times a day (BID) | ORAL | Status: DC
  Administered 2021-07-13 – 2021-07-17 (×7): 500 mg via ORAL
  Filled 2021-07-12 (×8): qty 1

## 2021-07-12 MED ORDER — KETAMINE HCL 10 MG/ML IJ SOLN
INTRAMUSCULAR | Status: DC | PRN
  Administered 2021-07-12: 20 mg via INTRAVENOUS
  Administered 2021-07-12 (×2): 10 mg via INTRAVENOUS

## 2021-07-12 MED ORDER — SODIUM CHLORIDE 0.9% FLUSH
3.0000 mL | INTRAVENOUS | Status: DC | PRN
  Administered 2021-07-15: 3 mL via INTRAVENOUS

## 2021-07-12 MED ORDER — VANCOMYCIN HCL 1250 MG/250ML IV SOLN
1250.0000 mg | INTRAVENOUS | Status: AC
  Administered 2021-07-12: 1250 mg via INTRAVENOUS
  Filled 2021-07-12: qty 250

## 2021-07-12 MED ORDER — ONDANSETRON HCL 4 MG/2ML IJ SOLN: 4.0000 mg | Freq: Four times a day (QID) | INTRAMUSCULAR | Status: DC | PRN

## 2021-07-12 MED ORDER — LOSARTAN POTASSIUM 50 MG PO TABS
50.0000 mg | ORAL_TABLET | Freq: Every day | ORAL | Status: DC
  Administered 2021-07-13 – 2021-07-14 (×2): 50 mg via ORAL
  Filled 2021-07-12 (×2): qty 1

## 2021-07-12 MED ORDER — CHLORHEXIDINE GLUCONATE CLOTH 2 % EX PADS: 6.0000 | MEDICATED_PAD | Freq: Once | CUTANEOUS | Status: DC

## 2021-07-12 SURGICAL SUPPLY — 88 items
ADH SKN CLS APL DERMABOND .7 (GAUZE/BANDAGES/DRESSINGS) ×1
BAG COUNTER SPONGE SURGICOUNT (BAG) ×3 IMPLANT
BAG SPNG CNTER NS LX DISP (BAG) ×1
BAND INSRT 18 STRL LF DISP RB (MISCELLANEOUS)
BAND RUBBER #18 3X1/16 STRL (MISCELLANEOUS) IMPLANT
BASKET BONE COLLECTION (BASKET) ×1 IMPLANT
BLADE CLIPPER SURG (BLADE) IMPLANT
BLADE SURG 11 STRL SS (BLADE) ×3 IMPLANT
BUR MATCHSTICK NEURO 3.0 LAGG (BURR) ×3 IMPLANT
BUR PRECISION FLUTE 5.0 (BURR) ×2 IMPLANT
CAGE INTERBODY PL LG 7X26.5X24 (Cage) ×1 IMPLANT
CANISTER SUCT 3000ML PPV (MISCELLANEOUS) ×3 IMPLANT
CNTNR URN SCR LID CUP LEK RST (MISCELLANEOUS) ×2 IMPLANT
CONT SPEC 4OZ STRL OR WHT (MISCELLANEOUS) ×2
COVER BACK TABLE 60X90IN (DRAPES) ×3 IMPLANT
DECANTER SPIKE VIAL GLASS SM (MISCELLANEOUS) ×2 IMPLANT
DERMABOND ADVANCED (GAUZE/BANDAGES/DRESSINGS) ×1
DERMABOND ADVANCED .7 DNX12 (GAUZE/BANDAGES/DRESSINGS) ×4 IMPLANT
DRAIN JACKSON PRATT 10MM FLAT (MISCELLANEOUS) IMPLANT
DRAPE 3/4 80X56 (DRAPES) ×3 IMPLANT
DRAPE C-ARM 42X72 X-RAY (DRAPES) ×3 IMPLANT
DRAPE C-ARMOR (DRAPES) ×3 IMPLANT
DRAPE LAPAROTOMY 100X72X124 (DRAPES) ×3 IMPLANT
DRAPE MICROSCOPE LEICA (MISCELLANEOUS) ×3 IMPLANT
DRESSING MEPILEX FLEX 4X4 (GAUZE/BANDAGES/DRESSINGS) IMPLANT
DRSG MEPILEX BORDER 4X8 (GAUZE/BANDAGES/DRESSINGS) ×1 IMPLANT
DRSG MEPILEX FLEX 4X4 (GAUZE/BANDAGES/DRESSINGS)
DRSG OPSITE POSTOP 4X6 (GAUZE/BANDAGES/DRESSINGS) ×2 IMPLANT
DURAPREP 26ML APPLICATOR (WOUND CARE) ×3 IMPLANT
ELECT BLADE INSULATED 6.5IN (ELECTROSURGICAL) ×2
ELECT COATED BLADE 2.86 ST (ELECTRODE) ×3 IMPLANT
ELECT REM PT RETURN 9FT ADLT (ELECTROSURGICAL) ×2
ELECTRODE BLDE INSULATED 6.5IN (ELECTROSURGICAL) ×2 IMPLANT
ELECTRODE REM PT RTRN 9FT ADLT (ELECTROSURGICAL) ×2 IMPLANT
EVACUATOR SILICONE 100CC (DRAIN) IMPLANT
EXTENDER TAB GUIDE SV 5.5/6.0 (INSTRUMENTS) ×8 IMPLANT
GAUZE 4X4 16PLY ~~LOC~~+RFID DBL (SPONGE) ×2 IMPLANT
GAUZE SPONGE 4X4 12PLY STRL (GAUZE/BANDAGES/DRESSINGS) IMPLANT
GLOVE EXAM NITRILE XL STR (GLOVE) IMPLANT
GLOVE SURG ENC MOIS LTX SZ8 (GLOVE) ×3 IMPLANT
GLOVE SURG LTX SZ7.5 (GLOVE) ×4 IMPLANT
GLOVE SURG POLYISO LF SZ7.5 (GLOVE) ×4 IMPLANT
GLOVE SURG UNDER POLY LF SZ7.5 (GLOVE) ×4 IMPLANT
GLOVE SURG UNDER POLY LF SZ8.5 (GLOVE) ×3 IMPLANT
GOWN STRL REUS W/ TWL LRG LVL3 (GOWN DISPOSABLE) ×2 IMPLANT
GOWN STRL REUS W/ TWL XL LVL3 (GOWN DISPOSABLE) ×4 IMPLANT
GOWN STRL REUS W/TWL 2XL LVL3 (GOWN DISPOSABLE) IMPLANT
GOWN STRL REUS W/TWL LRG LVL3 (GOWN DISPOSABLE) ×6
GOWN STRL REUS W/TWL XL LVL3 (GOWN DISPOSABLE) ×4
GUIDEWIRE BLUNT NT 450 (WIRE) ×4 IMPLANT
HEMOSTAT POWDER KIT SURGIFOAM (HEMOSTASIS) ×3 IMPLANT
KIT BASIN OR (CUSTOM PROCEDURE TRAY) ×3 IMPLANT
KIT INFUSE XX SMALL 0.7CC (Orthopedic Implant) ×1 IMPLANT
KIT TURNOVER KIT B (KITS) ×3 IMPLANT
KNIFE SURG 188MM BAYONET LONG (INSTRUMENTS) ×1 IMPLANT
MILL MEDIUM DISP (BLADE) ×3 IMPLANT
NDL ASPIRATION RAN815N 8X15 (NEEDLE) IMPLANT
NDL HYPO 18GX1.5 BLUNT FILL (NEEDLE) IMPLANT
NDL HYPO 21X1.5 SAFETY (NEEDLE) IMPLANT
NDL SPNL 18GX3.5 QUINCKE PK (NEEDLE) IMPLANT
NEEDLE ASPIRATION RAN815N 8X15 (NEEDLE) ×2 IMPLANT
NEEDLE ASPIRATION RANFAC 8X15 (NEEDLE) ×2
NEEDLE HYPO 18GX1.5 BLUNT FILL (NEEDLE) IMPLANT
NEEDLE HYPO 21X1.5 SAFETY (NEEDLE) ×2 IMPLANT
NEEDLE HYPO 22GX1.5 SAFETY (NEEDLE) ×3 IMPLANT
NEEDLE SPNL 18GX3.5 QUINCKE PK (NEEDLE) ×2 IMPLANT
NS IRRIG 1000ML POUR BTL (IV SOLUTION) ×3 IMPLANT
PACK LAMINECTOMY NEURO (CUSTOM PROCEDURE TRAY) ×3 IMPLANT
PAD ARMBOARD 7.5X6 YLW CONV (MISCELLANEOUS) ×9 IMPLANT
PUTTY DBF GRAFT 12CC W/DELIVE (Putty) ×1 IMPLANT
ROD PERC CCM 5.5X50 (Rod) ×1 IMPLANT
ROD PERC CCM 5.5X60 (Rod) ×1 IMPLANT
SCREW 5.5 VOYAGER MAS 6.5X50 (Screw) ×1 IMPLANT
SCREW SET 5.5/6.0MM SOLERA (Screw) ×4 IMPLANT
SCREW SPINAL IFIX 6.5X45 (Screw) ×3 IMPLANT
SPONGE SURGIFOAM ABS GEL 100 (HEMOSTASIS) IMPLANT
SPONGE T-LAP 4X18 ~~LOC~~+RFID (SPONGE) ×1 IMPLANT
SUT MNCRL AB 4-0 PS2 18 (SUTURE) ×4 IMPLANT
SUT VIC AB 0 CT1 18XCR BRD8 (SUTURE) ×2 IMPLANT
SUT VIC AB 0 CT1 8-18 (SUTURE) ×4
SUT VIC AB 2-0 CP2 18 (SUTURE) ×3 IMPLANT
SUT VIC AB 3-0 SH 8-18 (SUTURE) ×1 IMPLANT
SYR 30ML LL (SYRINGE) ×3 IMPLANT
TOWEL GREEN STERILE (TOWEL DISPOSABLE) ×3 IMPLANT
TOWEL GREEN STERILE FF (TOWEL DISPOSABLE) ×3 IMPLANT
TRAY FOLEY MTR SLVR 14FR STAT (SET/KITS/TRAYS/PACK) ×1 IMPLANT
TRAY FOLEY MTR SLVR 16FR STAT (SET/KITS/TRAYS/PACK) ×2 IMPLANT
WATER STERILE IRR 1000ML POUR (IV SOLUTION) ×3 IMPLANT

## 2021-07-12 NOTE — Anesthesia Procedure Notes (Signed)
Procedure Name: Intubation ?Date/Time: 07/12/2021 8:08 AM ?Performed by: Wilburn Cornelia, CRNA ?Pre-anesthesia Checklist: Patient identified, Emergency Drugs available, Suction available, Patient being monitored and Timeout performed ?Oxygen Delivery Method: Circle system utilized ?Preoxygenation: Pre-oxygenation with 100% oxygen ?Induction Type: IV induction ?Ventilation: Mask ventilation without difficulty ?Laryngoscope Size: Mac and 3 ?Grade View: Grade I ?Tube type: Oral ?Tube size: 7.0 mm ?Number of attempts: 1 ?Airway Equipment and Method: Stylet ?Placement Confirmation: ETT inserted through vocal cords under direct vision, positive ETCO2, CO2 detector and breath sounds checked- equal and bilateral ?Secured at: 21 cm ?Tube secured with: Tape ?Dental Injury: Teeth and Oropharynx as per pre-operative assessment  ? ? ? ? ?

## 2021-07-12 NOTE — Progress Notes (Signed)
Pharmacy Antibiotic Note ? ?Norma [name] is a 71 y.o. female s/p spinal surgery  Pharmacy has been consulted for vancomycin dosing for surgical prophylaxis ?-vancomycin '1250mg'$  IV given at 7am today ?-SCr= 1.7 on 3/23, CrCl ~ 35 ?-per MD request> for 2 doses q12hrs post op ? ?Plan: ?-With current renal function will give vancomycin '1250mg'$  IV x1 at 7am on 3/31 (this dose and the prior dose will cover the requested time frame) ?Will sign off. Please contact pharmacy with any other needs. ? ?Thank you ?Hildred Laser, PharmD ?Clinical Pharmacist ?**Pharmacist phone directory can now be found on [email or web address] ([email or web address]).  Listed under [place]. ? ? ?

## 2021-07-12 NOTE — H&P (Signed)
CC: back pain, right leg pain ? ?HPI: ? ?  ? ?Patient is a 71 y.o. female presents with back pain and right leg pain refractory to nonsurgical measures including PT and injections.  She had severe right L4-5 stenosis with disc space collapse and mild spondylolisthesis.  ? ? ? ?Patient Active Problem List  ? Diagnosis Date Noted  ? Dysphagia 05/22/2021  ? Chronic kidney disease, stage 3b ([place]) 11/29/2020  ? History of diabetes mellitus 11/29/2020  ? Chronic diastolic heart failure ([place]) 11/29/2020  ? Idiopathic urticaria 11/29/2020  ? IDA (iron deficiency anemia) 11/29/2020  ? Systolic heart failure ([place]) 11/14/2020  ? Myeloproliferative disorder ([place]) 05/22/2020  ? Thrombocytosis 05/22/2020  ? Body mass index (BMI) 45.0-49.9, adult ([place]) 05/17/2020  ? S/P ORIF (open reduction internal fixation) fracture left index finger 04/10/20 04/26/2020  ? Open displaced fracture of proximal phalanx of left index finger   ? Cellulitis of left hand   ? Dog bite, hand, left, initial encounter 04/09/2020  ? Dog bite 04/09/2020  ? Low back pain 03/01/2020  ? Hypoxemia 09/23/2019  ? Sleep related hypoxia 09/23/2019  ? Obesity with alveolar hypoventilation and body mass index (BMI) of 40 or greater ([place]) 09/23/2019  ? Hypersomnia with long sleep time, idiopathic 08/16/2019  ? Insomnia due to other mental disorder 08/16/2019  ? Dry mouth not due to sicca syndrome 08/16/2019  ? Chronic coughing 08/16/2019  ? Unilateral primary osteoarthritis, right knee 02/18/2019  ? Bilateral primary osteoarthritis of knee 01/20/2019  ? Diabetic neuropathy ([place]) 09/16/2018  ? Unilateral primary osteoarthritis, left knee 09/16/2018  ? Class 3 severe obesity due to excess calories with serious comorbidity and body mass index (BMI) of 45.0 to 49.9 in adult [facility]) 02/20/2018  ? Mixed hyperlipidemia 09/11/2017  ? Pressure injury of skin 09/02/2017  ? Coronary artery disease involving native heart without angina pectoris   ? S/P CABG x 3   ? Leukocytosis   ?  Acute blood loss anemia   ? Bipolar affective disorder ([place])   ? Diabetes mellitus type 2 in obese [facility]   ? History of CVA (cerebrovascular accident)   ? Hypokalemia   ? Hx of CABG 08/29/2017  ? Coronary artery disease involving native coronary artery of native heart with unstable angina pectoris ([place]) 08/28/2017  ? Unstable angina ([place]) 08/27/2017  ? Cardiomyopathy ([place]) 08/22/2017  ? Stroke (cerebrum) ([place]) 05/16/2017  ? Anemia 09/19/2016  ? Dizziness 06/21/2016  ? Chest pain with moderate risk for cardiac etiology 04/23/2016  ? Bipolar I disorder, most recent episode depressed ([place]) 06/05/2015  ? DM type 2 causing vascular disease ([place]) 09/20/2014  ? Chronic bilateral low back pain with bilateral sciatica 09/20/2014  ? Morbid obesity, unspecified obesity type ([place]) 09/20/2014  ? Memory difficulty 09/20/2014  ? Lumbosacral spondylosis without myelopathy 12/25/2012  ? Gastroparesis 11/04/2012  ? Restless legs syndrome (RLS) 09/17/2012  ? Headache(784.0) 03/19/2012  ? GERD (gastroesophageal reflux disease) 02/16/2012  ? Hypothyroidism 02/16/2012  ? Tremor 02/16/2012  ? Hyperlipidemia associated with type 2 diabetes mellitus ([place]) 04/01/2008  ? OSA (obstructive sleep apnea) 04/01/2008  ? Essential hypertension, benign 04/01/2008  ? LBBB (left bundle branch block) 04/01/2008  ? ?Past Medical History:  ?Diagnosis Date  ? Anemia   ? Anxiety   ? Bipolar disorder ([place])   ? Bulging lumbar disc   ? L3-4  ? Chronic daily headache   ? Chronic low back pain 09/20/2014  ? CKD (chronic kidney disease)   ? stage 3  ?  COPD (chronic obstructive pulmonary disease) ([place])   ? Coronary artery disease   ? CABG 2019  ? Degenerative arthritis   ? Depression   ? Diabetes mellitus, type II ([place])   ? Diabetic neuropathy ([place]) 09/16/2018  ? DM type 2 with diabetic peripheral neuropathy ([place]) 09/20/2014  ? Dyslipidemia   ? Dyspnea   ? Family history of adverse reaction to anesthesia   ? father had reaction to anesthesia medication per patient  "they don't use it anymore"- unsure of reaction  ? Fatty liver   ? per pt report  ? Gastroparesis   ? GERD (gastroesophageal reflux disease)   ? Heart murmur   ? History of hiatal hernia   ? pt states she no longer has this  ? Hypothyroidism   ? IBS (irritable bowel syndrome)   ? LBBB (left bundle branch block)   ? Memory difficulty 09/20/2014  ? Morbid obesity ([place])   ? Myocardial infarction [facility])   ? per pt- prior to CABG  ? Neuropathy   ? Obstructive sleep apnea on CPAP   ? Restless legs syndrome (RLS) 09/17/2012  ? Stroke (cerebrum) ([place]) 05/16/2017  ? Left parietal  ? Urticaria   ?  ?Past Surgical History:  ?Procedure Laterality Date  ? ABDOMINAL HYSTERECTOMY    ? ARTHROSCOPY KNEE W/ DRILLING  06/2011  ? and [place]ce[place] of 2013.  ? Carpel tunnel  1980's  ? CATARACT EXTRACTION W/PHACO Right 03/21/2017  ? Procedure: CATARACT EXTRACTION PHACO AND INTRAOCULAR LENS PLACEMENT RIGHT EYE;  Surgeon: Baruch Goldmann, MD;  Location: [facility];  Service: Ophthalmology;  Laterality: Right;  CDE: 2.91 ?  ? CATARACT EXTRACTION W/PHACO Left 04/18/2017  ? Procedure: CATARACT EXTRACTION PHACO AND INTRAOCULAR LENS PLACEMENT LEFT EYE;  Surgeon: Baruch Goldmann, MD;  Location: [facility];  Service: Ophthalmology;  Laterality: Left;  left  ? CHOLECYSTECTOMY    ? COLONOSCOPY WITH PROPOFOL N/A 10/04/2016  ? Procedure: COLONOSCOPY WITH PROPOFOL;  Surgeon: Rogene Houston, MD;  Location[facility];  Service: Endoscopy;  Laterality: N/A;  11:10  ? CORONARY ARTERY BYPASS GRAFT N/A 08/29/2017  ? Procedure: CORONARY ARTERY BYPASS GRAFTING (CABG) x 3 WITH ENDOSCOPIC HARVESTING OF RIGHT SAPHENOUS VEIN;  Surgeon: Ivin Poot, MD;  Location: [place];  Service: Open Heart Surgery;  Laterality: N/A;  ? ESOPHAGOGASTRODUODENOSCOPY N/A 04/25/2016  ? Procedure: ESOPHAGOGASTRODUODENOSCOPY (EGD);  Surgeon: Rogene Houston, MD;  [facility];  Service: Endoscopy;  Laterality: N/A;  730  ? LEFT HEART CATH AND CORONARY ANGIOGRAPHY N/A 08/27/2017  ?  Procedure: LEFT HEART CATH AND CORONARY ANGIOGRAPHY;  Surgeon: Sherren Mocha, MD;  Location: [place] [facility];  Service: Cardiovascular::  pLAD 95% - p-mLAD 50%, ostD1 90% -pD1 80%; ostOM1 90%; rPDA 80%. EF ~50-55% - HK of dital Anterolateral & Apical wall.  - Rec CVTS c/s  ? NECK SURGERY    ? pt reports having growth removed from the back of her neck- after 2021  ? OPEN REDUCTION INTERNAL FIXATION (ORIF) HAND Left 04/10/2020  ? Procedure: OPEN REDUCTION INTERNAL FIXATION (ORIF) HAND, left index finger;  Surgeon: Carole Civil, MD;  Location: [facility];  Service: Orthopedics;  Laterality: Left;  0.045 k wires  ? RECTAL SURGERY    ? fissure  ? SHOULDER SURGERY Left   ? arthroscopy in March of this year  ? TEE WITHOUT CARDIOVERSION N/A 08/29/2017  ? Procedure: TRANSESOPHAGEAL ECHOCARDIOGRAM (TEE);  Surgeon: Prescott Gum, Collier Salina, MD;  Location: [place];  Service: Open  Heart Surgery;  Laterality: N/A;  ? TRANSTHORACIC ECHOCARDIOGRAM  06/06/2017  ? Mild to moderate reduced EF 40 and 45%.  Anterior septal, inferoseptal and basal to mid inferior hypokinesis.  GR 1 DD.  No significant valvular lesion  ?  ?Medications Prior to Admission  ?Medication Sig Dispense Refill Last Dose  ? ALPRAZolam (XANAX) 0.25 MG tablet Take 1 tablet (0.25 mg total) by mouth daily as needed for anxiety. (Patient taking differently: Take 0.25 mg by mouth at bedtime as needed for anxiety or sleep.) 30 tablet 2 Past Week  ? aspirin EC 81 MG tablet Take 1 tablet (81 mg total) by mouth daily. 90 tablet 3 07/05/2021  ? buPROPion (WELLBUTRIN XL) 150 MG 24 hr tablet Take 1 tablet (150 mg total) by mouth every morning. 30 tablet 2 07/12/2021 at 0400  ? carvedilol (COREG) 6.25 MG tablet TAKE ONE TABLET BY MOUTH TWICE DAILY (MORNING,EVENING) (Patient taking differently: Take 6.25 mg by mouth 2 (two) times daily with a meal.) 180 tablet 2 07/12/2021 at 0400  ? cetirizine (ZYRTEC) 10 MG tablet Take 1 tablet tablet 1-2 times daily. (Patient taking differently:  Take 10 mg by mouth at bedtime.) 60 tablet 5 Past Week  ? diclofenac Sodium (VOLTAREN) 1 % GEL Apply 2 g topically 4 (four) times daily as needed (pain).     ? diphenhydrAMINE (BENADRYL) 25 mg capsule

## 2021-07-12 NOTE — Op Note (Signed)
PREOP DIAGNOSIS: Lumbar radiculopathy ? ?POSTOP DIAGNOSIS: Lumbar radiculopathy ? ?PROCEDURE: ?1. Right L4-5 lumbar interbody fusion via transforaminal approach with tubular retractors ?2. Posterior L4-5 arthrodesis ?3. Placement of interbody cage L4-5 ?4. Nonsegmental instrumentation with percutaneously placed pedicle screw and rod construct at L4-5 ?5. Harvest of local autograft ?6. Use of morselized allograft ?7. Use of microscope for microdissection ? ?SURGEON: Dr. Duffy Rhody, MD ? ?ASSISTANT: Weston Brass, NP.  Please note, there were no qualified trainees available to assist with the procedure.  An assistant was required for aid in retraction of the neural elements. ?  ?ANESTHESIA: General Endotracheal ? ?EBL: 100 ml ? ?IMPLANTS:  ?Medtronic  ?6.5 x 45 mm screws at L4 bilaterally and right L5, 6.5 x 50 mm screw at L L5 ?Catalyft expandable long cage, 87m-12mm  ?35 mm and 50 mm rods ? ?SPECIMENS: None ? ?DRAINS: None ? ?COMPLICATIONS: none ? ?CONDITION: Stable to PACU ? ?HISTORY: ?Norma DUEITTis a 71y.o. female who initially presented to the outpatient clinic with mechanical back pain and right radiculopathy.  MRI showed severe right L4-5 disc degeneration with severe foraminal stenosis and mild spondylolisthesis.  PT and injections failed to improve her symptoms which were worsening. Treatment options were discussed and the patient elected to proceed with MIS TLIF at L4-5.  She understood she was at higher risk of surgery because of her numerous medical comorbidities.  The general technique of surgery was discussed, as well as risks, benefits, alternatives, and expected convalescence were discussed with the patient.  Risks discussed included but were not limited to bleeding, pain, infection, scar, pseudoarthrosis, CSF leak, neurologic deficit, damage to nearby organs, paralysis, and death.  The patient wished to proceed with surgery and informed consent was obtained. ? ?PROCEDURE IN DETAIL: ?After  informed consent was obtained and witnessed, the patient was brought to the operating room. After induction of general anesthesia, the patient was positioned on the operative table in the prone position on a radiolucent table with a Wilson frame with all pressure points meticulously padded. The skin of the low back was then prepped and draped in the usual sterile fashion. ? ?Under fluoroscopy, the pedicles of the correct levels were identified and marked out on the skin, and after timeout was conducted, the skin was infiltrated with local anesthetic.  A 10 blade was used to incise the skin and the fascia overlying the pedicles on AP fluoroscopy.  Jamshidi's were then placed at the lateral aspect of the pedicle is identified AP fluoroscopy and malleted into the body under x-ray guidance.  C-arm was then switched to lateral which confirmed entry into the body.  K wires were then passed through the Jamshidi's and the sheaths were removed.   The tubular retractor system was then docked on the right L4-5 facet joint under fluoroscopic guidance .  Sequential dilators and final tubular retractor was placed and locked in position under fluoroscopic guidance.  The microscope was then introduced in the field to allow for intraoperative microdissection.  Remaining muscle was moved off of the degenerated facet joint.  The inferior facets was removed using an osteotome and harvested for autograft.  Overgrown superior facets was then removed with rongeurs.  The traversing nerve root was identified and good decompression was performed with removal of overgrown ligament and facets with rongeurs.  The exiting nerve root in the collapsed foramen was also seen.  Epidural veins in the foramen were coagulated and cut with allowed access to the disc space lateral  to the traversing nerve root.  The traversing nerve root was gently retracted medially to allow greater access to the disc.  The disc was opened with 15 blade and pituitary  rongeur was used to remove disc material.  Disc shavers of increasing size was then used to clean the disc space as well as to restore disc space height.  The interbody space was prepared with rasps and curettes with removal of the cartilage from the endplates.  Appropriate sized interbody spacer was then sized.  The inner body space was packed with morselized allograft mixed with autograft.  An expandable size 9m interbody spacer was then tamped into place with a mallet under x-ray guidance.  He was then expanded until snug with the endplates.  AP and lateral x-ray confirmed good position of the implant.  The wound was then irrigated thoroughly with bacitracin impregnated irrigation and meticulous hemostasis was obtained.  The retractor was then removed and meticulous hemostasis was obtained in the muscle layer and subcutaneous layer.  6.5 mm pedicle screws were placed over the K wires at each level under fluoroscopic guidance.  Good purchase was noted.  Rods were then passed through the screw towers bilaterally and secured with screw caps.  X-ray confirmed good placement.  The screw caps were final tightened and the towers removed. Left L4-5 facet joint was decorticated with a curette and allograft mixed with autograft was placed in the lateral gutter. Wounds were irrigated thoroughly with bacitracin impregnated irrigation.  Exparel mixed with Marcaine was injected into the paraspinous muscles and subcutaneous tissues bilaterally.  The fascia was closed with 0 Vicryl stitches.  The dermal layer was closed with 2-0 Vicryl stitches in buried interrupted fashion.  The skin incisions were closed with 4-0 Monocryl subcuticular manner followed Dermabond.  Sterile dressings were placed.  Patient was then flipped supine and extubated by the anesthesia service following commands and all 4 extremities.  All counts were correct at the end of surgery.  No complications were noted.  ? ?

## 2021-07-12 NOTE — Transfer of Care (Signed)
Immediate Anesthesia Transfer of Care Note ? ?Patient: JON KASPAREK ? ?Procedure(s) Performed: Minimally Invasive Surgery Transforaminal Lumbar Interbody Fusion  Lumbar four-five (Right) ? ?Patient Location: PACU ? ?Anesthesia Type:General ? ?Level of Consciousness: awake and alert  ? ?Airway & Oxygen Therapy: Patient Spontanous Breathing and Patient connected to nasal cannula oxygen ? ?Post-op Assessment: Report given to RN, Post -op Vital signs reviewed and stable and Patient moving all extremities X 4 ? ?Post vital signs: Reviewed and stable ? ?Last Vitals:  ?Vitals Value Taken Time  ?BP 118/75 07/12/21 1225  ?Temp    ?Pulse 73 07/12/21 1226  ?Resp 15 07/12/21 1226  ?SpO2 99 % 07/12/21 1226  ?Vitals shown include unvalidated device data. ? ?Last Pain:  ?Vitals:  ? 07/12/21 0611  ?TempSrc: Oral  ?   ? ?  ? ?Complications: No notable events documented. ?

## 2021-07-12 NOTE — Progress Notes (Signed)
Orthopedic Tech Progress Note ?Patient Details:  ?Norma [name] ?1950-11-21 ?[id number] ? ? ?Ortho Devices ?Type of Ortho Device: Lumbar corsett ?Ortho Device/Splint Location: handed off to RN at bedside -PACU 12 ?Ortho Device/Splint Interventions: Ordered ?  ?  ? ?Carin Primrose ?07/12/2021, 1:57 PM ? ?

## 2021-07-12 NOTE — Anesthesia Postprocedure Evaluation (Signed)
Anesthesia Post Note ? ?Patient: Norma [name] ? ?Procedure(s) Performed: Minimally Invasive Surgery Transforaminal Lumbar Interbody Fusion  Lumbar four-five (Right) ? ?  ? ?Patient location during evaluation: PACU ?Anesthesia Type: General ?Level of consciousness: awake and alert ?Pain management: pain level controlled ?Vital Signs Assessment: post-procedure vital signs reviewed and stable ?Respiratory status: spontaneous breathing, nonlabored ventilation and respiratory function stable ?Cardiovascular status: blood pressure returned to baseline and stable ?Postop Assessment: no apparent nausea or vomiting ?Anesthetic complications: no ? ? ?No notable events documented. ? ?Last Vitals:  ?Vitals:  ? 07/12/21 1307 07/12/21 1322  ?BP: 111/61 (!) 120/58  ?Pulse: 72 75  ?Resp: (!) 24 (!) 25  ?Temp:    ?SpO2: 97% 93%  ?  ?Last Pain:  ?Vitals:  ? 07/12/21 1307  ?TempSrc:   ?PainSc: 7   ? ? ?  ?  ?  ?  ?  ?  ? ?Lidia Collum ? ? ? ? ?

## 2021-07-12 NOTE — Progress Notes (Incomplete)
I placed an order for a wound consult per request by OR staff during report in PACU. Patient had a pre-existing lower abdominal wound upon arrival to hospital. OR staff described wound as "weepy" and covered it with a silicone dressing. Marcello Moores, MD looked at the wound in the OR and is aware. Relayed this information to follow up with wound consult to receiving nurse. ? ?Arley Phenix, MD for a CPAP order for patient tonight. Patient states she sleeps with CPAP and brought the tubing with her but left the machine at home. ?

## 2021-07-13 ENCOUNTER — Encounter (HOSPITAL_COMMUNITY): Payer: Self-pay | Admitting: Neurosurgery

## 2021-07-13 DIAGNOSIS — E785 Hyperlipidemia, unspecified: Secondary | ICD-10-CM | POA: Diagnosis not present

## 2021-07-13 DIAGNOSIS — E119 Type 2 diabetes mellitus without complications: Secondary | ICD-10-CM | POA: Diagnosis not present

## 2021-07-13 DIAGNOSIS — I10 Essential (primary) hypertension: Secondary | ICD-10-CM | POA: Diagnosis not present

## 2021-07-13 DIAGNOSIS — Z9889 Other specified postprocedural states: Secondary | ICD-10-CM | POA: Insufficient documentation

## 2021-07-13 LAB — CBC
HCT: 31.7 % — ABNORMAL LOW (ref 36.0–46.0)
Hemoglobin: 10.1 g/dL — ABNORMAL LOW (ref 12.0–15.0)
MCH: 29.8 pg (ref 26.0–34.0)
MCHC: 31.9 g/dL (ref 30.0–36.0)
MCV: 93.5 fL (ref 80.0–100.0)
Platelets: 229 10*3/uL (ref 150–400)
RBC: 3.39 MIL/uL — ABNORMAL LOW (ref 3.87–5.11)
RDW: 14 % (ref 11.5–15.5)
WBC: 16.9 10*3/uL — ABNORMAL HIGH (ref 4.0–10.5)
nRBC: 0 % (ref 0.0–0.2)

## 2021-07-13 LAB — BASIC METABOLIC PANEL
Anion gap: 5 (ref 5–15)
BUN: 18 mg/dL (ref 8–23)
CO2: 28 mmol/L (ref 22–32)
Calcium: 8.7 mg/dL — ABNORMAL LOW (ref 8.9–10.3)
Chloride: 101 mmol/L (ref 98–111)
Creatinine, Ser: 1.39 mg/dL — ABNORMAL HIGH (ref 0.44–1.00)
GFR, Estimated: 41 mL/min — ABNORMAL LOW (ref >60)
Glucose, Bld: 171 mg/dL — ABNORMAL HIGH (ref 70–99)
Potassium: 4.7 mmol/L (ref 3.5–5.1)
Sodium: 134 mmol/L — ABNORMAL LOW (ref 135–145)

## 2021-07-13 LAB — GLUCOSE, CAPILLARY
Glucose-Capillary: 123 mg/dL — ABNORMAL HIGH (ref 70–99)
Glucose-Capillary: 152 mg/dL — ABNORMAL HIGH (ref 70–99)
Glucose-Capillary: 178 mg/dL — ABNORMAL HIGH (ref 70–99)
Glucose-Capillary: 131 mg/dL — ABNORMAL HIGH (ref 70–99)

## 2021-07-13 NOTE — Plan of Care (Signed)
Pt is alert oriented x 4. Pt c/o back pain 9/10 sharp pain,  prn dilaudid given PRN for pain down to 5/10. Pt noted crying due to feeling like shes tired of not being able to move around on her own. Moral support provided. Pt stated she no longer had numbness to legs or the sharp pain in her shins. Pt foley catheter was removed. Pt has voided one time with use of walker and up to bedside commode. Indwelling foley catheter removal at 0330. ? ? ?Problem: Education: ?Goal: Required Educational Video(s) ?Outcome: Progressing ?  ?Problem: Clinical Measurements: ?Goal: Ability to maintain clinical measurements within normal limits will improve ?Outcome: Progressing ?Goal: Postoperative complications will be avoided or minimized ?Outcome: Progressing ?  ?Problem: Skin Integrity: ?Goal: Demonstration of wound healing without infection will improve ?Outcome: Progressing ?  ?Problem: Education: ?Goal: Knowledge of General Education information will improve ?Description: Including pain rating scale, medication(s)/side effects and non-pharmacologic comfort measures ?Outcome: Progressing ?  ?Problem: Education: ?Goal: Knowledge of General Education information will improve ?Description: Including pain rating scale, medication(s)/side effects and non-pharmacologic comfort measures ?Outcome: Progressing ?  ?Problem: Health Behavior/Discharge Planning: ?Goal: Ability to manage health-related needs will improve ?Outcome: Progressing ?  ?Problem: Clinical Measurements: ?Goal: Ability to maintain clinical measurements within normal limits will improve ?Outcome: Progressing ?Goal: Will remain free from infection ?Outcome: Progressing ?Goal: Diagnostic test results will improve ?Outcome: Progressing ?Goal: Respiratory complications will improve ?Outcome: Progressing ?Goal: Cardiovascular complication will be avoided ?Outcome: Progressing ?  ?Problem: Activity: ?Goal: Risk for activity intolerance will decrease ?Outcome: Progressing ?   ?Problem: Nutrition: ?Goal: Adequate nutrition will be maintained ?Outcome: Progressing ?  ?Problem: Coping: ?Goal: Level of anxiety will decrease ?Outcome: Progressing ?  ?Problem: Elimination: ?Goal: Will not experience complications related to bowel motility ?Outcome: Progressing ?Goal: Will not experience complications related to urinary retention ?Outcome: Progressing ?  ?Problem: Pain Managment: ?Goal: General experience of comfort will improve ?Outcome: Progressing ?  ?Problem: Safety: ?Goal: Ability to remain free from injury will improve ?Outcome: Progressing ?  ?Problem: Skin Integrity: ?Goal: Risk for impaired skin integrity will decrease ?Outcome: Progressing ?  ?

## 2021-07-13 NOTE — [place] FL2 (Signed)
?[place] MEDICAID FL2 LEVEL OF CARE SCREENING TOOL  ?  ? ?IDENTIFICATION  ?Patient Name: ?Norma [name] Birthdate: 26-Mar-1951 Sex: female Admission Date (Current Location): ?07/12/2021  ?[place] and [place] Number: ? [place] and Address:  ?[facility], [place] [address] [place], [place] [address] ?     Provider Number: ?[id number]  ?Attending Physician Name and Address:  ?Vallarie Mare, MD ? Relative Name and Phone Number:  ?  ?   ?Current Level of Care: ?Hospital Recommended Level of Care: ?[place] Prior Approval Number: ?  ? ?Date Approved/Denied: ?  PASRR Number: ?Manual review ? ?Discharge Plan: ?SNF ?  ? ?Current Diagnoses: ?Patient Active Problem List  ? Diagnosis Date Noted  ? Lumbar radiculopathy 07/12/2021  ? Dysphagia 05/22/2021  ? Chronic kidney disease, stage 3b ([place]) 11/29/2020  ? History of diabetes mellitus 11/29/2020  ? Chronic diastolic heart failure ([place]) 11/29/2020  ? Idiopathic urticaria 11/29/2020  ? IDA (iron deficiency anemia) 11/29/2020  ? Systolic heart failure ([place]) 11/14/2020  ? Myeloproliferative disorder ([place]) 05/22/2020  ? Thrombocytosis 05/22/2020  ? Body mass index (BMI) 45.0-49.9, adult ([place]) 05/17/2020  ? S/P ORIF (open reduction internal fixation) fracture left index finger 04/10/20 04/26/2020  ? Open displaced fracture of proximal phalanx of left index finger   ? Cellulitis of left hand   ? Dog bite, hand, left, initial encounter 04/09/2020  ? Dog bite 04/09/2020  ? Low back pain 03/01/2020  ? Hypoxemia 09/23/2019  ? Sleep related hypoxia 09/23/2019  ? Obesity with alveolar hypoventilation and body mass index (BMI) of 40 or greater ([place]) 09/23/2019  ? Hypersomnia with long sleep time, idiopathic 08/16/2019  ? Insomnia due to other mental disorder 08/16/2019  ? Dry mouth not due to sicca syndrome 08/16/2019  ? Chronic coughing 08/16/2019  ? Unilateral primary osteoarthritis, right knee 02/18/2019  ? Bilateral primary  osteoarthritis of knee 01/20/2019  ? Diabetic neuropathy ([place]) 09/16/2018  ? Unilateral primary osteoarthritis, left knee 09/16/2018  ? Class 3 severe obesity due to excess calories with serious comorbidity and body mass index (BMI) of 45.0 to 49.9 in adult [facility]) 02/20/2018  ? Mixed hyperlipidemia 09/11/2017  ? Pressure injury of skin 09/02/2017  ? Coronary artery disease involving native heart without angina pectoris   ? S/P CABG x 3   ? Leukocytosis   ? Acute blood loss anemia   ? Bipolar affective disorder ([place])   ? Diabetes mellitus type 2 in obese [facility])   ? History of CVA (cerebrovascular accident)   ? Hypokalemia   ? Hx of CABG 08/29/2017  ? Coronary artery disease involving native coronary artery of native heart with unstable angina pectoris ([place]) 08/28/2017  ? Unstable angina ([place]) 08/27/2017  ? Cardiomyopathy ([place]) 08/22/2017  ? Stroke (cerebrum) ([place]) 05/16/2017  ? Anemia 09/19/2016  ? Dizziness 06/21/2016  ? Chest pain with moderate risk for cardiac etiology 04/23/2016  ? Bipolar I disorder, most recent episode depressed ([place]) 06/05/2015  ? DM type 2 causing vascular disease ([place]) 09/20/2014  ? Chronic bilateral low back pain with bilateral sciatica 09/20/2014  ? Morbid obesity, unspecified obesity type ([place]) 09/20/2014  ? Memory difficulty 09/20/2014  ? Lumbosacral spondylosis without myelopathy 12/25/2012  ? Gastroparesis 11/04/2012  ? Restless legs syndrome (RLS) 09/17/2012  ? Headache(784.0) 03/19/2012  ? GERD (gastroesophageal reflux disease) 02/16/2012  ? Hypothyroidism 02/16/2012  ? Tremor 02/16/2012  ? Hyperlipidemia associated with type 2 diabetes mellitus ([place]) 04/01/2008  ?  OSA (obstructive sleep apnea) 04/01/2008  ? Essential hypertension, benign 04/01/2008  ? LBBB (left bundle branch block) 04/01/2008  ? ? ?Orientation RESPIRATION BLADDER Height & Weight   ?  ?Self, Time, Situation, Place ? Other (Comment) (CPAP at night) Continent Weight: 240 lb (108.9 kg) ?Height:  5' (152.4 cm)   ?BEHAVIORAL SYMPTOMS/MOOD NEUROLOGICAL BOWEL NUTRITION STATUS  ?    Continent Diet (carb modified)  ?AMBULATORY STATUS COMMUNICATION OF NEEDS Skin   ?Extensive Assist Verbally PU Stage and Appropriate Care, Surgical wounds (closed back incision, honeycomb dressing) ?PU Stage 1 Dressing:  (unstageable wounds on skin folds in abdomen: foam dressing, check every shift to assess and change PRN or when soiled) ?  ?  ?    ?     ?     ? ? ?Personal Care Assistance Level of Assistance  ?Bathing, Feeding, Dressing Bathing Assistance: Limited assistance ?Feeding assistance: Independent ?Dressing Assistance: Limited assistance ?   ? ?Functional Limitations Info  ?    ?  ?   ? ? ?SPECIAL CARE FACTORS FREQUENCY  ?PT (By licensed PT), OT (By licensed OT)   ?  ?PT Frequency: 5x/wk ?OT Frequency: 5x/wk ?  ?  ?  ?   ? ? ?Contractures Contractures Info: Not present  ? ? ?Additional Factors Info  ?Code Status, Allergies, Psychotropic, Insulin Sliding Scale Code Status Info: Full ?Allergies Info: Amoxicillin, Hydrocodone, Percocet (Oxycodone-acetaminophen), Vancomycin, Depacon (Valproate Sodium), Depacon (Valproic Acid) ?Psychotropic Info: Wellbutrin XL '150mg'$  daily in the morning; Effexor XR '300mg'$  daily at bed; Xanax 0.'25mg'$  at bed PRN ?Insulin Sliding Scale Info: see DC summary ?  ?   ? ?Current Medications (07/13/2021):  This is the current hospital active medication list ?Current Facility-Administered Medications  ?Medication Dose Route Frequency Provider Last Rate Last Admin  ? 0.9 %  sodium chloride infusion  250 mL Intravenous Continuous Vallarie Mare, MD      ? 0.9 % NaCl with KCl 20 mEq/ L  infusion   Intravenous Continuous Vallarie Mare, MD   Stopped at 07/12/21 2200  ? acetaminophen (TYLENOL) tablet 650 mg  650 mg Oral Q4H PRN Vallarie Mare, MD      ? Or  ? acetaminophen (TYLENOL) suppository 650 mg  650 mg Rectal Q4H PRN Vallarie Mare, MD      ? ALPRAZolam Duanne Moron) tablet 0.25 mg  0.25 mg Oral QHS PRN  Vallarie Mare, MD   0.25 mg at 07/12/21 2227  ? buPROPion (WELLBUTRIN XL) 24 hr tablet 150 mg  150 mg Oral q morning Vallarie Mare, MD   150 mg at 07/13/21 1049  ? carvedilol (COREG) tablet 6.25 mg  6.25 mg Oral BID WC Vallarie Mare, MD   6.25 mg at 07/13/21 0845  ? diphenhydrAMINE (BENADRYL) capsule 25 mg  25 mg Oral Q4H PRN Vallarie Mare, MD      ? docusate sodium (COLACE) capsule 100 mg  100 mg Oral BID Vallarie Mare, MD   100 mg at 07/13/21 1045  ? empagliflozin (JARDIANCE) tablet 10 mg  10 mg Oral Daily Vallarie Mare, MD   10 mg at 07/13/21 1046  ? enoxaparin (LOVENOX) injection 40 mg  40 mg Subcutaneous Q24H Vallarie Mare, MD   40 mg at 07/13/21 1155  ? famotidine (PEPCID) tablet 20 mg  20 mg Oral QHS Vallarie Mare, MD   20 mg at 07/12/21 2059  ? gabapentin (NEURONTIN) capsule 400 mg  400 mg Oral  Daily Vallarie Mare, MD   400 mg at 07/13/21 1046  ? gabapentin (NEURONTIN) capsule 800 mg  800 mg Oral QHS Vallarie Mare, MD   800 mg at 07/12/21 2059  ? glipiZIDE (GLUCOTROL XL) 24 hr tablet 5 mg  5 mg Oral BID Vallarie Mare, MD   5 mg at 07/13/21 0845  ? HYDROmorphone (DILAUDID) injection 0.5 mg  0.5 mg Intravenous Q2H PRN Vallarie Mare, MD   0.5 mg at 07/13/21 1155  ? insulin aspart (novoLOG) injection 0-15 Units  0-15 Units Subcutaneous TID WC Vallarie Mare, MD   3 Units at 07/13/21 1154  ? insulin aspart (novoLOG) injection 0-5 Units  0-5 Units Subcutaneous QHS Vallarie Mare, MD      ? levothyroxine (SYNTHROID) tablet 112 mcg  112 mcg Oral QAC breakfast Vallarie Mare, MD   112 mcg at 07/13/21 2707  ? loratadine (CLARITIN) tablet 10 mg  10 mg Oral Daily PRN Vallarie Mare, MD      ? losartan (COZAAR) tablet 50 mg  50 mg Oral Daily Vallarie Mare, MD   50 mg at 07/13/21 1045  ? meclizine (ANTIVERT) tablet 25 mg  25 mg Oral TID PRN Vallarie Mare, MD      ? menthol-cetylpyridinium (CEPACOL) lozenge 3 mg  1 lozenge Oral PRN  Vallarie Mare, MD      ? Or  ? phenol (CHLORASEPTIC) mouth spray 1 spray  1 spray Mouth/Throat PRN Vallarie Mare, MD      ? metFORMIN (GLUCOPHAGE) tablet 500 mg  500 mg Oral BID WC Vallarie Mare, MD   500 mg a

## 2021-07-13 NOTE — Evaluation (Signed)
Occupational Therapy Evaluation Patient Details Name: Norma [name] MRN: [id number] DOB: 11/01/1950 Today's Date: 07/13/2021   History of Present Illness Pt is a 71 y/o female presenting 3/30 wit back pain and lumbar radiculopathy, s/p R L 4-5 lumbar interbody fusion, PLA L 4-5, fixation, microdissection and grafting.[name]  PMHx:  DM, dHF, sHF, finger sx, obesity with alveolar hypoventilation, OA bil kneesCAD with CABGx3, CVA, CM, bipolar d/o, RLS, OSA, LBBB   Clinical Impression   Pt admitted for procedure and concerns listed above. PTA pt reported that she was "somewhat" independent with all ADL's and functional mobility, using a RW. At this time, pt presents with increased pain and weakness, requiring min A to power up to standing with increased time and min A for log roll technique with bed mobility. All ADL's are limited due to pain and spinal precautions. Recommending AIR to maximize her independence and safety before returning home. OT will continue to follow acutely.      Recommendations for follow up therapy are one component of a multi-disciplinary discharge planning process, led by the attending physician.  Recommendations may be updated based on patient status, additional functional criteria and insurance authorization.   Follow Up Recommendations  Acute inpatient rehab (3hours/day)    Assistance Recommended at Discharge Frequent or constant Supervision/Assistance  Patient can return home with the following A lot of help with walking and/or transfers;A lot of help with bathing/dressing/bathroom;Direct supervision/assist for medications management    Functional Status Assessment  Patient has had a recent decline in their functional status and demonstrates the ability to make significant improvements in function in a reasonable and predictable amount of time.  Equipment Recommendations  Other (comment) (TBD based on progress)    Recommendations for Other Services Rehab  consult     Precautions / Restrictions Precautions Precautions: Back Required Braces or Orthoses: Spinal Brace Spinal Brace: Lumbar corset;Applied in sitting position Restrictions Weight Bearing Restrictions: No      Mobility Bed Mobility Overal bed mobility: Needs Assistance Bed Mobility: Sit to Sidelying, Rolling Rolling: Supervision       Sit to sidelying: Min assist General bed mobility comments: Following log roll technique provided by PT with no cuing needed    Transfers Overall transfer level: Needs assistance Equipment used: Rolling walker (2 wheels) Transfers: Sit to/from Stand Sit to Stand: Min assist           General transfer comment: cues for hand placement and technique, as well as increased time to power up.      Balance Overall balance assessment: Needs assistance Sitting-balance support: Single extremity supported, No upper extremity supported, Feet supported Sitting balance-Leahy Scale: Fair     Standing balance support: Bilateral upper extremity supported, Reliant on assistive device for balance Standing balance-Leahy Scale: Poor Standing balance comment: reliant on AD and external support                           ADL either performed or assessed with clinical judgement   ADL Overall ADL's : Needs assistance/impaired Eating/Feeding: Set up;Sitting   Grooming: Set up;Sitting   Upper Body Bathing: Min guard;Sitting   Lower Body Bathing: Moderate assistance;Adhering to back precautions;Sitting/lateral leans;Sit to/from stand   Upper Body Dressing : Min guard;Sitting   Lower Body Dressing: Moderate assistance;Adhering to back precautions   Toilet Transfer: Minimal assistance;Stand-pivot   Toileting- Clothing Manipulation and Hygiene: Minimal assistance;Adhering to back precautions;Sitting/lateral lean;Sit to/from stand  Functional mobility during ADLs: Minimal assistance;Rolling walker (2 wheels) General ADL  Comments: Limited due to back pain and spinal precautions, as well as generalized weakness     Vision Baseline Vision/History: 1 Wears glasses Ability to See in Adequate Light: 0 Adequate Patient Visual Report: No change from baseline Vision Assessment?: No apparent visual deficits     Perception     Praxis      Pertinent Vitals/Pain Pain Assessment Pain Assessment: 0-10 Pain Score: 10-Worst pain ever Pain Location: back Pain Descriptors / Indicators: Discomfort, Sore, Aching, Grimacing Pain Intervention(s): Monitored during session, Repositioned     Hand Dominance Right   Extremity/Trunk Assessment Upper Extremity Assessment Upper Extremity Assessment: Generalized weakness   Lower Extremity Assessment Lower Extremity Assessment: Defer to PT evaluation   Cervical / Trunk Assessment Cervical / Trunk Assessment: Kyphotic;Back Surgery   Communication Communication Communication: No difficulties   Cognition Arousal/Alertness: Awake/alert Behavior During Therapy: WFL for tasks assessed/performed Overall Cognitive Status: Within Functional Limits for tasks assessed                                       General Comments  VSS on RA    Exercises     Shoulder Instructions      Home Living Family/patient expects to be discharged to:: Skilled nursing facility Living Arrangements: Alone Available Help at Discharge: Available PRN/intermittently Type of Home: House Home Access: Stairs to enter [employer] of Steps: multiple short (handicap height steps Entrance Stairs-Rails: Right;Left Home Layout: Two level;Able to live on main level with bedroom/bathroom               Home Equipment: Rolling Walker (2 wheels);Cane - single point;Shower seat;BSC/3in1;Wheelchair - manual          Prior Functioning/Environment Prior Level of Function : Independent/Modified Independent;Other (comment) (in a very limited capacity)              Mobility Comments: Sounds like pt moves with dificulty in a very limited space. ADLs Comments: Reports indep, however states that she typically only showers 1x per week due to difficulty getting in and out of the tub.        OT Problem List: Decreased strength;Decreased activity tolerance;Impaired balance (sitting and/or standing);Pain      OT Treatment/Interventions: Self-care/ADL training;Therapeutic exercise;Energy conservation;DME and/or AE instruction;Therapeutic activities;Patient/family education;Balance training    OT Goals(Current goals can be found in the care plan section) Acute Rehab OT Goals Patient Stated Goal: Go home OT Goal Formulation: With patient Time For Goal Achievement: 07/27/21 Potential to Achieve Goals: Good ADL Goals Pt Will Perform Lower Body Bathing: with supervision;sitting/lateral leans;sit to/from stand Pt Will Perform Lower Body Dressing: with supervision;sitting/lateral leans;sit to/from stand;with adaptive equipment Pt Will Transfer to Toilet: with supervision;ambulating Pt Will Perform Toileting - Clothing Manipulation and hygiene: with supervision;sitting/lateral leans;sit to/from stand;with adaptive equipment Additional ADL Goal #1: Pt will tolerate standing for 5 mins to complete grooming at the sink with no rest break and supervision.  OT Frequency: Min 2X/week    Co-evaluation              AM-PAC OT "6 Clicks" Daily Activity     Outcome Measure Help from another person eating meals?: A Little Help from another person taking care of personal grooming?: A Little Help from another person toileting, which includes using toliet, bedpan, or urinal?: A Lot Help from another person bathing (  including washing, rinsing, drying)?: A Lot Help from another person to put on and taking off regular upper body clothing?: A Little Help from another person to put on and taking off regular lower body clothing?: A Lot 6 Click Score: 15   End of Session  Equipment Utilized During Treatment: Rolling walker (2 wheels);Back brace Nurse Communication: Mobility status  Activity Tolerance: Patient limited by pain Patient left: in bed;with call bell/phone within reach;with bed alarm set  OT Visit Diagnosis: Unsteadiness on feet (R26.81);Other abnormalities of gait and mobility (R26.89);Muscle weakness (generalized) (M62.81)                Time: 1610-9604 OT Time Calculation (min): 23 min Charges:  OT General Charges $OT Visit: 1 Visit OT Evaluation $OT Eval Moderate Complexity: 1 Mod OT Treatments $Therapeutic Activity: 8-22 mins  Anastacia Reinecke H., OTR/L Acute Rehabilitation  Greidys Deland Elane Bing Plume 07/13/2021, 3:55 PM

## 2021-07-13 NOTE — Progress Notes (Signed)
Neurosurgery ? ?Patient seen and examined. Preoperative leg pain is resolved. Complaining of some appropriate midline back pain which is interfering with her mobility. Patient is requesting recovery at [facility] facility.

## 2021-07-13 NOTE — Progress Notes (Signed)
Name: Norma [name] ?DOB: 10-15-1950 ? ?Please be advised that the above-named patient will require a short-term nursing home stay -- anticipated 30 days or less for rehabilitation and strengthening. The plan is for return home.  ?

## 2021-07-13 NOTE — Consult Note (Signed)
WOC Nurse Consult Note: ?Reason for Consult: Consult requested for abd wounds.  Pt is familiar to [facility] team from previous admission.  She has chronic full thickness wounds to midline abd skin fold; 3X3X.2cm and 2X2X.2cm, red and moist, small amt tan drainage, no odor or fluctuance. Areas in between other abd/groin skin folds is red and moist; appearance consistent with moisture associated skin damage and probable candidiasis.  ? ?ICD-10 CM Codes for Irritant Dermatitis ?L30.4  - Erythema intertrigo; dermatitis due to sweating and friction, friction dermatitis, and genital/thigh intertrigo.  ? ?Dressing procedure/placement/frequency: Topical treatment orders provided for bedside nurses to perform as follows to promote drying and healing: Foam dressing to abd wounds, change Q 3 days or PRN soiling.   ?Apply antifungal powder to skin folds BID and PRN. ?Please re-consult if further assistance is needed.  Thank-you,  ?Julien Girt MSN, RN, [place], [place], CNS ?[phone]  ? ?  ?

## 2021-07-13 NOTE — Evaluation (Signed)
Physical Therapy Evaluation ?Patient Details ?Name: Norma [name] ?MRN: [id number] ?DOB: 02/09/1951 ?Today's Date: 07/13/2021 ? ?History of Present Illness ? Pt is a 71 y/o female presenting 3/30 wit back pain and lumbar radiculopathy, s/p R L 4-5 lumbar interbody fusion, PLA L 4-5, fixation, microdissection and grafting.[name]  PMHx:  DM, dHF, sHF, finger sx, obesity with alveolar hypoventilation, OA bil kneesCAD with CABGx3, CVA, CM, bipolar d/o, RLS, OSA, LBBB  ?Clinical Impression ? Pt admitted with/for Lumbar fusion.  Pt mobilizing at a moderate assist level at this time.  Pt currently limited functionally due to the problems listed. ( See problems list.)   Pt will benefit from PT to maximize function and safety in order to get ready for next venue listed below. ?   ?   ? ?Recommendations for follow up therapy are one component of a multi-disciplinary discharge planning process, led by the attending physician.  Recommendations may be updated based on patient status, additional functional criteria and insurance authorization. ? ?Follow Up Recommendations Skilled nursing-short term rehab (<3 hours/day) ? ?  ?Assistance Recommended at Discharge Intermittent Supervision/Assistance  ?Patient can return home with the following ? A little help with walking and/or transfers;A little help with bathing/dressing/bathroom;Assistance with cooking/housework;Assist for transportation;Help with stairs or ramp for entrance ? ?  ?Equipment Recommendations Other (comment) (TBD)  ?Recommendations for Other Services ?    ?  ?Functional Status Assessment Patient has had a recent decline in their functional status and demonstrates the ability to make significant improvements in function in a reasonable and predictable amount of time.  ? ?  ?Precautions / Restrictions Precautions ?Precautions: Back ?Required Braces or Orthoses: Spinal Brace ?Spinal Brace: Lumbar corset;Applied in sitting position ?Restrictions ?Weight Bearing Restrictions:  No  ? ?  ? ?Mobility ? Bed Mobility ?Overal bed mobility: Needs Assistance ?Bed Mobility: Sit to Sidelying, Rolling ?Rolling: Mod assist ?  ?  ?  ?Sit to sidelying: Min assist ?General bed mobility comments: education of technique for bed mobility/log roll and cues during task back to bed. ?  ? ?Transfers ?Overall transfer level: Needs assistance ?Equipment used: Rolling walker (2 wheels) ?Transfers: Sit to/from Stand ?Sit to Stand: Mod assist ?  ?  ?  ?  ?  ?General transfer comment: cues for hand placement and technique, assist appropriately forward and for boost. ?  ? ?Ambulation/Gait ?Ambulation/Gait assistance: Mod assist ?Gait Distance (Feet): 4 Feet (fowrard and back to the bed.) ?Assistive device: Rolling walker (2 wheels) ?Gait Pattern/deviations: Step-through pattern ?  ?Gait velocity interpretation: <1.31 ft/sec, indicative of household ambulator ?  ?General Gait Details: Short, weak tentative steps with moderate use of the RW.  stability assist and assist maneuvering the RW. ? ?Stairs ?  ?  ?  ?  ?  ? ?Wheelchair Mobility ?  ? ?Modified Rankin (Stroke Patients Only) ?  ? ?  ? ?Balance Overall balance assessment: Needs assistance ?Sitting-balance support: Single extremity supported, No upper extremity supported, Feet supported ?Sitting balance-Leahy Scale: Fair ?  ?  ?Standing balance support: Bilateral upper extremity supported, Reliant on assistive device for balance ?Standing balance-Leahy Scale: Poor ?Standing balance comment: reliant on AD and external support ?  ?  ?  ?  ?  ?  ?  ?  ?  ?  ?  ?   ? ? ? ?Pertinent Vitals/Pain Pain Assessment ?Pain Assessment: 0-10 ?Pain Score: 9  ?Pain Location: back ?Pain Descriptors / Indicators: Discomfort, Sore ?Pain Intervention(s): Monitored during session, Patient requesting pain meds-RN  notified  ? ? ?Home Living Family/patient expects to be discharged to:: Skilled nursing facility ?Living Arrangements: Alone ?Available Help at Discharge: Available  PRN/intermittently ?Type of Home: House ?Home Access: Stairs to enter ?Entrance Stairs-Rails: Right;Left ?Entrance Stairs-Number of Steps: multiple short (handicap height steps ?  ?Home Layout: Two level;Able to live on main level with bedroom/bathroom ?Home Equipment: Conservation officer, nature (2 wheels);Cane - single point;Shower seat;BSC/3in1;Wheelchair - manual ?   ?  ?Prior Function Prior Level of Function : Independent/Modified Independent;Other (comment) (in a very limited capacity) ?  ?  ?  ?  ?  ?  ?Mobility Comments: Sounds like pt moves with dificulty in a very limited space. ?  ?  ? ? ?Hand Dominance  ?   ? ?  ?Extremity/Trunk Assessment  ? Upper Extremity Assessment ?Upper Extremity Assessment:  (generally weak) ?  ? ?Lower Extremity Assessment ?Lower Extremity Assessment: Generalized weakness ?  ? ?Cervical / Trunk Assessment ?Cervical / Trunk Assessment: Kyphotic;Back Surgery  ?Communication  ? Communication: No difficulties  ?Cognition Arousal/Alertness: Awake/alert ?Behavior During Therapy: [facility] for tasks assessed/performed ?Overall Cognitive Status: Within Functional Limits for tasks assessed ?  ?  ?  ?  ?  ?  ?  ?  ?  ?  ?  ?  ?  ?  ?  ?  ?  ?  ?  ? ?  ?General Comments General comments (skin integrity, edema, etc.): pt instructed in back care/prec, log roll, bracing issues, lifting restrictions and progression of activity post op and post d/c. ? ?  ?Exercises    ? ?Assessment/Plan  ?  ?PT Assessment Patient needs continued PT services  ?PT Problem List Decreased strength;Decreased activity tolerance;Decreased balance;Decreased mobility;Decreased knowledge of use of DME;Pain ? ?   ?  ?PT Treatment Interventions DME instruction;Gait training;Functional mobility training;Therapeutic activities;Balance training;Patient/family education   ? ?PT Goals (Current goals can be found in the Care Plan section)  ?Acute Rehab PT Goals ?Patient Stated Goal: get better and able to care for myself at home. ?PT Goal  Formulation: With patient ?Time For Goal Achievement: 07/27/21 ?Potential to Achieve Goals: Good ? ?  ?Frequency Min 5X/week ?  ? ? ?Co-evaluation   ?  ?  ?  ?  ? ? ?  ?AM-PAC PT "6 Clicks" Mobility  ?Outcome Measure Help needed turning from your back to your side while in a flat bed without using bedrails?: A Lot ?Help needed moving from lying on your back to sitting on the side of a flat bed without using bedrails?: A Lot ?Help needed moving to and from a bed to a chair (including a wheelchair)?: A Lot ?Help needed standing up from a chair using your arms (e.g., wheelchair or bedside chair)?: A Lot ?Help needed to walk in hospital room?: A Lot ?Help needed climbing 3-5 steps with a railing? : A Lot ?6 Click Score: 12 ? ?  ?End of Session Equipment Utilized During Treatment: Back brace ?Activity Tolerance: Patient tolerated treatment well;Patient limited by pain;Patient limited by fatigue ?Patient left: in bed;with call bell/phone within reach;with bed alarm set;with SCD's reapplied ?Nurse Communication: Mobility status ?PT Visit Diagnosis: Other abnormalities of gait and mobility (R26.89);Pain;Difficulty in walking, not elsewhere classified (R26.2) ?Pain - part of body:  (back) ?  ? ?Time: 2836-6294 ?PT Time Calculation (min) (ACUTE ONLY): 31 min ? ? ?Charges:   PT Evaluation ?$PT Eval Moderate Complexity: 1 Mod ?PT Treatments ?$Therapeutic Activity: 8-22 mins ?  ?   ? ? ?07/13/2021 ? ?  Ginger Carne., PT ?Acute Rehabilitation Services ?[phone]  (pager) ?[phone]  (office) ? ?Tessie Fass Lizandro Spellman ?07/13/2021, 12:33 PM ? ?

## 2021-07-13 NOTE — TOC Initial Note (Signed)
Transition of Care (TOC) - Initial/Assessment Note  ? ? ?Patient Details  ?Name: Norma [name] ?MRN: [id number] ?Date of Birth: December 20, 1950 ? ?Transition of Care [facility]) CM/SW Contact:    ?Geralynn Ochs, LCSW ?Phone Number: ?07/13/2021, 3:40 PM ? ?Clinical Narrative:      CSW met with patient to discuss recommendation for SNF. Patient in agreement as she lives at home alone and is worried about her ability to return home and care for herself at this time. Patient asking for either [facility] or [facility]. CSW completed referral and faxed out. PASRR is pending, CSW sent request to MD to cosign documentation to send in to [place] contacted [facility] to ask them to review referral. CSW to follow.           ? ? ?Expected Discharge Plan: [place] ?Barriers to Discharge: Continued Medical Work up; Awaiting State Approval (PASRR) ? ? ?Patient Goals and CMS Choice ?Patient states their goals for this hospitalization and ongoing recovery are:: to get rehab ?CMS Medicare.gov Compare Post Acute Care list provided to:: Patient ?Choice offered to / list presented to : Patient ? ?Expected Discharge Plan and Services ?Expected Discharge Plan: [place] ?  ?  ?Post Acute Care Choice: [place] ?Living arrangements for the past 2 months: [place] ?                ?  ?  ?  ?  ?  ?  ?  ?  ?  ?  ? ?Prior Living Arrangements/Services ?Living arrangements for the past 2 months: [place] ?Lives with:: Self ?Patient language and need for interpreter reviewed:: No ?Do you feel safe going back to the place where you live?: Yes      ?Need for Family Participation in Patient Care: No (Comment) ?Care giver support system in place?: No (comment) ?  ?Criminal Activity/Legal Involvement Pertinent to Current Situation/Hospitalization: No - Comment as needed ? ?Activities of Daily Living ?Home Assistive Devices/Equipment: Cane (specify quad or straight), CBG  Meter, Eyeglasses, CPAP (straight cane) ?ADL Screening (condition at time of admission) ?Patient's cognitive ability adequate to safely complete daily activities?: Yes ?Is the patient deaf or have difficulty hearing?: No ?Does the patient have difficulty seeing, even when wearing glasses/contacts?: No ?Does the patient have difficulty concentrating, remembering, or making decisions?: Yes ?Patient able to express need for assistance with ADLs?: Yes ?Does the patient have difficulty dressing or bathing?: No ?Independently performs ADLs?: Yes (appropriate for developmental age) ?Does the patient have difficulty walking or climbing stairs?: Yes ?Weakness of Legs: Both ?Weakness of Arms/Hands: None ? ?Permission Sought/Granted ?Permission sought to share information with : Customer service manager ?Permission granted to share information with : Yes, Verbal Permission Granted ?   ? Permission granted to share info w AGENCY: SNF ?   ?   ? ?Emotional Assessment ?Appearance:: Appears stated age ?Attitude/Demeanor/Rapport: Engaged ?Affect (typically observed): Appropriate ?Orientation: : Oriented to Self, Oriented to Place, Oriented to  Time, Oriented to Situation ?Alcohol / Substance Use: Not Applicable ?Psych Involvement: No (comment) ? ?Admission diagnosis:  Lumbar radiculopathy [M54.16] ?Patient Active Problem List  ? Diagnosis Date Noted  ? Lumbar radiculopathy 07/12/2021  ? Dysphagia 05/22/2021  ? Chronic kidney disease, stage 3b ([place]) 11/29/2020  ? History of diabetes mellitus 11/29/2020  ? Chronic diastolic heart failure ([place]) 11/29/2020  ? Idiopathic urticaria 11/29/2020  ? IDA (iron deficiency anemia) 11/29/2020  ? Systolic heart failure (  [place]) 11/14/2020  ? Myeloproliferative disorder ([place]) 05/22/2020  ? Thrombocytosis 05/22/2020  ? Body mass index (BMI) 45.0-49.9, adult ([place]) 05/17/2020  ? S/P ORIF (open reduction internal fixation) fracture left index finger 04/10/20 04/26/2020  ? Open displaced fracture of  proximal phalanx of left index finger   ? Cellulitis of left hand   ? Dog bite, hand, left, initial encounter 04/09/2020  ? Dog bite 04/09/2020  ? Low back pain 03/01/2020  ? Hypoxemia 09/23/2019  ? Sleep related hypoxia 09/23/2019  ? Obesity with alveolar hypoventilation and body mass index (BMI) of 40 or greater ([place]) 09/23/2019  ? Hypersomnia with long sleep time, idiopathic 08/16/2019  ? Insomnia due to other mental disorder 08/16/2019  ? Dry mouth not due to sicca syndrome 08/16/2019  ? Chronic coughing 08/16/2019  ? Unilateral primary osteoarthritis, right knee 02/18/2019  ? Bilateral primary osteoarthritis of knee 01/20/2019  ? Diabetic neuropathy ([place]) 09/16/2018  ? Unilateral primary osteoarthritis, left knee 09/16/2018  ? Class 3 severe obesity due to excess calories with serious comorbidity and body mass index (BMI) of 45.0 to 49.9 in adult [facility]) 02/20/2018  ? Mixed hyperlipidemia 09/11/2017  ? Pressure injury of skin 09/02/2017  ? Coronary artery disease involving native heart without angina pectoris   ? S/P CABG x 3   ? Leukocytosis   ? Acute blood loss anemia   ? Bipolar affective disorder ([place])   ? Diabetes mellitus type 2 in obese [facility])   ? History of CVA (cerebrovascular accident)   ? Hypokalemia   ? Hx of CABG 08/29/2017  ? Coronary artery disease involving native coronary artery of native heart with unstable angina pectoris ([place]) 08/28/2017  ? Unstable angina ([place]) 08/27/2017  ? Cardiomyopathy ([place]) 08/22/2017  ? Stroke (cerebrum) ([place]) 05/16/2017  ? Anemia 09/19/2016  ? Dizziness 06/21/2016  ? Chest pain with moderate risk for cardiac etiology 04/23/2016  ? Bipolar I disorder, most recent episode depressed ([place]) 06/05/2015  ? DM type 2 causing vascular disease ([place]) 09/20/2014  ? Chronic bilateral low back pain with bilateral sciatica 09/20/2014  ? Morbid obesity, unspecified obesity type ([place]) 09/20/2014  ? Memory difficulty 09/20/2014  ? Lumbosacral spondylosis without myelopathy 12/25/2012  ?  Gastroparesis 11/04/2012  ? Restless legs syndrome (RLS) 09/17/2012  ? Headache(784.0) 03/19/2012  ? GERD (gastroesophageal reflux disease) 02/16/2012  ? Hypothyroidism 02/16/2012  ? Tremor 02/16/2012  ? Hyperlipidemia associated with type 2 diabetes mellitus ([place]) 04/01/2008  ? OSA (obstructive sleep apnea) 04/01/2008  ? Essential hypertension, benign 04/01/2008  ? LBBB (left bundle branch block) 04/01/2008  ? ?PCP:  Celene Squibb, MD ?Pharmacy:   ?[facility] - [place] ?[place] ?[place] [address] ?Phone: [phone] Fax: [phone] ? ? ? ? ?Social Determinants of Health (SDOH) Interventions ?  ? ?Readmission Risk Interventions ?   ? View : No data to display.  ?  ?  ?  ? ? ? ?

## 2021-07-13 NOTE — Progress Notes (Signed)
Subjective: ?Patient reports that she is doing well and is pleased with her postoperative status. She has appropriate surgical pain. RLE radiculopathy is much improved. No acute events overnight.  ? ?Objective: ?Vital signs in last 24 hours: ?Temp:  [97.4 ?F (36.3 ?C)-98.9 ?F (37.2 ?C)] 98.2 ?F (36.8 ?C) (03/31 0815) ?Pulse Rate:  [66-94] 67 (03/31 0818) ?Resp:  [12-25] 14 (03/31 0815) ?BP: (93-131)/(35-86) 101/50 (03/31 0818) ?SpO2:  [91 %-99 %] 93 % (03/31 0818) ? ?Intake/Output from previous day: ?03/30 0701 - 03/31 0700 ?In: 2851.2 [P.O.:360; I.V.:1741.2; IV MVHQIONGE:952] ?Out: 8413 [Urine:3180; Blood:150] ?Intake/Output this shift: ?No intake/output data recorded. ? ?Physical Exam: Patient is awake, A/O X 4, conversant, and in good spirits. They are in NAD and VSS. Doing well. Speech is fluent and appropriate. MAEW with good strength that is symmetric bilaterally.  BUE 5/5 throughout, BLE 5/5 throughout. Sensation to light touch is intact. PERLA, EOMI. CNs grossly intact. Dressing is clean dry intact. Incision is well approximated with no drainage, erythema, or edema. ? ? ? ?Lab Results: ?Recent Labs  ?  07/13/21 ?0230  ?WBC 16.9*  ?HGB 10.1*  ?HCT 31.7*  ?PLT 229  ? ?BMET ?Recent Labs  ?  07/13/21 ?0230  ?NA 134*  ?K 4.7  ?CL 101  ?CO2 28  ?GLUCOSE 171*  ?BUN 18  ?CREATININE 1.39*  ?CALCIUM 8.7*  ? ? ?Studies/Results: ?DG Lumbar Spine 2-3 Views ? ?Result Date: 07/12/2021 ?CLINICAL DATA:  Lumbar fusion of L4-L5 EXAM: LUMBAR SPINE - 2 VIEW COMPARISON:  None. FINDINGS: Fluoroscopic images were obtained intraoperatively and submitted for post operative interpretation. Lumbar fusion of L4-L5 with interbody spacer. Hardware in expected position, 2 images were obtained with 166.3 seconds of fluoroscopy time and 164.8 mGy^2. Please see the performing provider's procedural report for further detail. IMPRESSION: As above. Electronically Signed   By: Yetta Glassman M.D.   On: 07/12/2021 12:12  ? ?DG C-Arm 1-60 Min-No  Report ? ?Result Date: 07/12/2021 ?Fluoroscopy was utilized by the requesting physician.  No radiographic interpretation.  ? ?DG C-Arm 1-60 Min-No Report ? ?Result Date: 07/12/2021 ?Fluoroscopy was utilized by the requesting physician.  No radiographic interpretation.  ? ?DG C-Arm 1-60 Min-No Report ? ?Result Date: 07/12/2021 ?Fluoroscopy was utilized by the requesting physician.  No radiographic interpretation.  ? ?DG C-Arm 1-60 Min-No Report ? ?Result Date: 07/12/2021 ?Fluoroscopy was utilized by the requesting physician.  No radiographic interpretation.   ? ?Assessment/Plan: ?Patient is post-op day 1 s/p L4/5 right MIS TLIF. She is recovering well and reports a significant reduction of her preoperative symptoms.  Her only complaint is mild incisional discomfort.  She has ambulated with nursing staff in her room. She is awaiting PT/OT evaluation. She does not feel ready for discharge today as she has no help at home and has steps she will have to navigate. Awaiting therapy recommendations. Continue LSO brace when OOB. Continue working on pain control, mobility and ambulating patient. Will plan for discharge tomorrow.  ? ? LOS: 1 day  ? ? ? ?Marvis Moeller, DNP, NP-C ?07/13/2021, 9:38 AM ? ? ? ? ?

## 2021-07-14 LAB — GLUCOSE, CAPILLARY
Glucose-Capillary: 163 mg/dL — ABNORMAL HIGH (ref 70–99)
Glucose-Capillary: 74 mg/dL (ref 70–99)
Glucose-Capillary: 87 mg/dL (ref 70–99)
Glucose-Capillary: 75 mg/dL (ref 70–99)
Glucose-Capillary: 73 mg/dL (ref 70–99)

## 2021-07-14 MED ORDER — SODIUM CHLORIDE 0.9 % IV SOLN
INTRAVENOUS | Status: DC
Start: 2021-07-14 — End: 2021-07-17

## 2021-07-14 MED ORDER — SODIUM CHLORIDE 0.9 % IV BOLUS
500.0000 mL | Freq: Once | INTRAVENOUS | Status: AC | PRN
  Administered 2021-07-14: 500 mL via INTRAVENOUS

## 2021-07-14 MED ORDER — MIDODRINE HCL 5 MG PO TABS
5.0000 mg | ORAL_TABLET | Freq: Three times a day (TID) | ORAL | Status: AC
  Administered 2021-07-14 – 2021-07-16 (×5): 5 mg via ORAL
  Filled 2021-07-14 (×5): qty 1

## 2021-07-14 NOTE — Plan of Care (Signed)
Pt is alert oriented x 4. Pt is on 3L nasal cannula. Pt noted to have oxygen off of face at times. Reapplied. Pt also had CPAP on, pt removed twice and reapplied. Pt did not get much rest last night. Pt is SOB on exertion, PRN torsemide given this am. Pt has been ambulating to bedside commode.pt has been irritated with self upon moving in bed and using bedside commode. Pt stated she is getting frustrated with not being able to move like she needs to and being in pain. Encouragement provided. Prn pain medication given but refused.  Pt was +1 assist to bedside commode prior shift. however through the night the pt has become more tired, increased SOB on exertion, and did require +2 assistance to bedside commode. Pts does complain of pain to back but refusing to take medication for it. PRN muscle relaxer's given.  ? ?Problem: Education: ?Goal: Required Educational Video(s) ?Outcome: Progressing ?  ?Problem: Clinical Measurements: ?Goal: Ability to maintain clinical measurements within normal limits will improve ?Outcome: Progressing ?Goal: Postoperative complications will be avoided or minimized ?Outcome: Progressing ?  ?Problem: Skin Integrity: ?Goal: Demonstration of wound healing without infection will improve ?Outcome: Progressing ?  ?Problem: Education: ?Goal: Knowledge of General Education information will improve ?Description: Including pain rating scale, medication(s)/side effects and non-pharmacologic comfort measures ?Outcome: Progressing ?  ?Problem: Health Behavior/Discharge Planning: ?Goal: Ability to manage health-related needs will improve ?Outcome: Progressing ?  ?Problem: Clinical Measurements: ?Goal: Ability to maintain clinical measurements within normal limits will improve ?Outcome: Progressing ?Goal: Will remain free from infection ?Outcome: Progressing ?Goal: Diagnostic test results will improve ?Outcome: Progressing ?Goal: Respiratory complications will improve ?Outcome: Progressing ?Goal:  Cardiovascular complication will be avoided ?Outcome: Progressing ?  ?Problem: Activity: ?Goal: Risk for activity intolerance will decrease ?Outcome: Progressing ?  ?Problem: Nutrition: ?Goal: Adequate nutrition will be maintained ?Outcome: Progressing ?  ?Problem: Coping: ?Goal: Level of anxiety will decrease ?Outcome: Progressing ?  ?Problem: Elimination: ?Goal: Will not experience complications related to bowel motility ?Outcome: Progressing ?Goal: Will not experience complications related to urinary retention ?Outcome: Progressing ?  ?Problem: Pain Managment: ?Goal: General experience of comfort will improve ?Outcome: Progressing ?  ?Problem: Safety: ?Goal: Ability to remain free from injury will improve ?Outcome: Progressing ?  ?Problem: Skin Integrity: ?Goal: Risk for impaired skin integrity will decrease ?Outcome: Progressing ?  ?

## 2021-07-14 NOTE — Progress Notes (Signed)
? ?  Providing Compassionate, Quality Care - Together ? ?NEUROSURGERY PROGRESS NOTE ? ? ?S: No issues overnight.  ? ?O: EXAM:  ?BP (!) 107/43 (BP Location: Left Arm)   Pulse 81   Temp 98.9 ?F (37.2 ?C) (Oral)   Resp 16   Ht 5' (1.524 m)   Wt 108.9 kg   SpO2 95%   BMI 46.87 kg/m?  ? ?Awake, alert, oriented  ?PERRL ?Speech fluent, appropriate  ?CNs grossly intact  ?5/5 BUE ?4+/5 BLE  ? ?ASSESSMENT:  ?70 y.o. female with  ? ?L4-5 spondylosis ?-Status post L4-5 TLIF, postop day 2 ? ?PLAN: ? ?-Rehab pending ?-Monitor hypotension, patient appears to be more stable today ?-Pain controlled ? ? ? ?Thank you for allowing me to participate in this patient's care.  Please do not hesitate to call with questions or concerns. ? ? ?Ashika Apuzzo, DO ?Neurosurgeon ?[facility] ?Cell: [phone] ? ?

## 2021-07-14 NOTE — Progress Notes (Signed)
Physical Therapy Treatment ?Patient Details ?Name: Norma [name] ?MRN: [id number] ?DOB: 06-29-1950 ?Today's Date: 07/14/2021 ? ? ?History of Present Illness Pt is a 71 y/o female presenting 3/30 wit back pain and lumbar radiculopathy, s/p R L 4-5 lumbar interbody fusion, PLA L 4-5, fixation, microdissection and grafting.[name]  PMHx:  DM, dHF, sHF, finger sx, obesity with alveolar hypoventilation, OA bil kneesCAD with CABGx3, CVA, CM, bipolar d/o, RLS, OSA, LBBB ? ?  ?PT Comments  ? ? Pt slowly progressing towards goals. Pt with soft BP at 90/42 and significant over all weakness. Pt requiring modAx2 for step pvt transfers. Pt unable to tolerate ambulation at this time due to onset of radiating R LE pain in [place]. Acute PT to cont to follow. Provided pt with LE seated HEP. ?   ?Recommendations for follow up therapy are one component of a multi-disciplinary discharge planning process, led by the attending physician.  Recommendations may be updated based on patient status, additional functional criteria and insurance authorization. ? ?Follow Up Recommendations ? Skilled nursing-short term rehab (<3 hours/day) ?  ?  ?Assistance Recommended at Discharge Frequent or constant Supervision/Assistance  ?Patient can return home with the following Assistance with cooking/housework;Assist for transportation;Help with stairs or ramp for entrance;A lot of help with walking and/or transfers;A lot of help with bathing/dressing/bathroom ?  ?Equipment Recommendations ? Other (comment) (TBD)  ?  ?Recommendations for Other Services   ? ? ?  ?Precautions / Restrictions Precautions ?Precautions: Back ?Precaution Booklet Issued: No ?Precaution Comments: pt unable to recall back precautions, pt re-educated with no carry over ?Required Braces or Orthoses: Spinal Brace ?Spinal Brace: Lumbar corset;Applied in sitting position ?Restrictions ?Weight Bearing Restrictions: No ?Other Position/Activity Restrictions: back precaution limitations  ?   ? ?Mobility ? Bed Mobility ?Overal bed mobility: Needs Assistance ?Bed Mobility: Rolling, Sidelying to Sit ?Rolling: Mod assist ?Sidelying to sit: Mod assist ?  ?  ?  ?General bed mobility comments: pt unable to reach across with R UE to grab bed rail, modA to initiate rolling with bed pad, modA for trunk elevation ?  ? ?Transfers ?Overall transfer level: Needs assistance ?Equipment used: Rolling walker (2 wheels) (then 2 person face to face transfer) ?Transfers: Sit to/from Stand, Bed to chair/wheelchair/BSC ?Sit to Stand: Mod assist, +2 physical assistance ?  ?Step pivot transfers: Mod assist, +2 physical assistance ?  ?  ?  ?General transfer comment: pt attempted to stand up to RW with minA however unable to clear bottom, pt required modAx2 with blocking of knees to power up into standing from elevated surface. pt completed 3 step pvt transfers (bed to [facility] back to bed then to chair). Pt improved with each transfer however was unable to use RW requiring face to face transfer with max step by step verbal cues ?  ? ?Ambulation/Gait ?  ?  ?Assistive device: Rolling walker (2 wheels) ?  ?  ?  ?  ?General Gait Details: pt unable to complete 10 marches in place with RW due to onset of R radiating LE pain and fatigue. Pt unable to ambulate away from chair ? ? ?Stairs ?  ?  ?  ?  ?  ? ? ?Wheelchair Mobility ?  ? ?Modified Rankin (Stroke Patients Only) ?  ? ? ?  ?Balance Overall balance assessment: Needs assistance ?Sitting-balance support: Single extremity supported, No upper extremity supported, Feet supported ?Sitting balance-Leahy Scale: Fair ?  ?  ?Standing balance support: Bilateral upper extremity supported, Reliant on assistive device for balance ?  Standing balance-Leahy Scale: Poor ?Standing balance comment: reliant on AD and external support ?  ?  ?  ?  ?  ?  ?  ?  ?  ?  ?  ?  ? ?  ?Cognition Arousal/Alertness: Awake/alert ?Behavior During Therapy: [facility] for tasks assessed/performed ?Overall Cognitive Status:  Within Functional Limits for tasks assessed ?  ?  ?  ?  ?  ?  ?  ?  ?  ?  ?  ?  ?  ?  ?  ?  ?  ?  ?  ? ?  ?Exercises   ? ?  ?General Comments General comments (skin integrity, edema, etc.): BP 90/42 upon transfer to [facility], pt with noted loss of color, BP taken again at end of session at 91/53, RN notified, pt attempted to perform pericare while sitting on commode but ultimately required assist ?  ?  ? ?Pertinent Vitals/Pain Pain Assessment ?Pain Assessment: 0-10 ?Pain Score: 8  ?Pain Location: Radiating down R LE from back when WBing ?Pain Descriptors / Indicators: Discomfort, Sore, Aching, Grimacing ?Pain Intervention(s): Monitored during session  ? ? ?Home Living   ?  ?  ?  ?  ?  ?  ?  ?  ?  ?   ?  ?Prior Function    ?  ?  ?   ? ?PT Goals (current goals can now be found in the care plan section) Acute Rehab PT Goals ?Patient Stated Goal: get better and able to care for myself at home. ?PT Goal Formulation: With patient ?Time For Goal Achievement: 07/27/21 ?Potential to Achieve Goals: Good ?Progress towards PT goals: Progressing toward goals ? ?  ?Frequency ? ? ? Min 5X/week ? ? ? ?  ?PT Plan Current plan remains appropriate  ? ? ?Co-evaluation   ?  ?  ?  ?  ? ?  ?AM-PAC PT "6 Clicks" Mobility   ?Outcome Measure ? Help needed turning from your back to your side while in a flat bed without using bedrails?: A Lot ?Help needed moving from lying on your back to sitting on the side of a flat bed without using bedrails?: A Lot ?Help needed moving to and from a bed to a chair (including a wheelchair)?: A Lot ?Help needed standing up from a chair using your arms (e.g., wheelchair or bedside chair)?: A Lot ?Help needed to walk in hospital room?: A Lot ?Help needed climbing 3-5 steps with a railing? : A Lot ?6 Click Score: 12 ? ?  ?End of Session Equipment Utilized During Treatment: Back brace ?Activity Tolerance: Patient tolerated treatment well;Patient limited by pain;Patient limited by fatigue ?Patient left: in bed;with  call bell/phone within reach;with bed alarm set;with SCD's reapplied ?Nurse Communication: Mobility status ?PT Visit Diagnosis: Other abnormalities of gait and mobility (R26.89);Pain;Difficulty in walking, not elsewhere classified (R26.2) ?Pain - Right/Left: Right ?Pain - part of body: Leg (back) ?  ? ? ?Time: 1020-1050 ?PT Time Calculation (min) (ACUTE ONLY): 30 min ? ?Charges:  $Therapeutic Activity: 23-37 mins          ?          ? ?Kittie Plater, PT, DPT ?Acute Rehabilitation Services ?Secure chat preferred ?Office #: [phone] ? ? ? ?Isidoro Santillana M Holli Rengel ?07/14/2021, 1:58 PM ? ?

## 2021-07-14 NOTE — Progress Notes (Signed)
Signed 30 day note and FL2 uploaded to [facility]. PASRR remains pending review at this time. SW will follow.  ? ?Wandra Feinstein, MSW, LCSW ?[phone] (coverage) ? ? ?

## 2021-07-14 NOTE — Progress Notes (Signed)
Breakfast order placed per pt request. CPAP applied to pt.  ?

## 2021-07-14 NOTE — Progress Notes (Signed)
?   07/14/21 1219 07/14/21 1300  ?Assess: MEWS Score  ?Temp 98.2 ?F (36.8 ?C)  --   ?BP (!) 70/42  --   ?Pulse Rate 92  --   ?Resp 18  --   ?Level of Consciousness Alert Alert  ?SpO2 90 %  --   ?O2 Device Nasal Cannula  --   ?Assess: MEWS Score  ?MEWS Temp 0 0  ?MEWS Systolic 2 1  ?MEWS Pulse 0 0  ?MEWS RR 0 0  ?MEWS LOC 0 0  ?MEWS Score 2 1  ?MEWS Score Color Yellow Green  ?Assess: if the MEWS score is Yellow or Red  ?Were vital signs taken at a resting state? Yes  --   ?Focused Assessment Change from prior assessment (see assessment flowsheet)  --   ?Early Detection of Sepsis Score *See Row Information* Low  --   ?MEWS guidelines implemented *See Row Information* Yes  --   ?Treat  ?MEWS Interventions Escalated (See documentation below)  --   ?Pain Scale 0-10  --   ?Pain Score 0  --   ?Take Vital Signs  ?Increase Vital Sign Frequency  Yellow: Q 2hr X 2 then Q 4hr X 2, if remains yellow, continue Q 4hrs  --   ?Escalate  ?MEWS: Escalate Yellow: discuss with charge nurse/RN and consider discussing with provider and RRT  --   ?Notify: Charge Nurse/RN  ?Name of Charge Nurse/RN Notified Clois Dupes, RN  --   ?Date Charge Nurse/RN Notified 07/14/21  --   ?Time Charge Nurse/RN Notified 1220  --   ?Notify: Provider  ?Provider Name/Title Dr. Pieter Partridge  [place]  --   ?Date Provider Notified 07/14/21  --   ?Time Provider Notified 1225  --   ?Notification Type Page  --   ?Notification Reason Change in status  --   ?Provider response See new orders  --   ?Date of Provider Response 07/14/21  --   ?Time of Provider Response 1255  --   ?Notify: Rapid Response  ?Name of Rapid Response RN Notified Saralyn Pilar, RN  --   ?Date Rapid Response Notified 07/14/21  --   ?Time Rapid Response Notified 1220  --   ?Document  ?Patient Outcome Stabilized after interventions  --   ?Progress note created (see row info) Yes  --   ? ? ?

## 2021-07-14 NOTE — Progress Notes (Signed)
Staff assessing patient in the chair; her BP is low; patient c/o feeling tired and wants to return to bed.  Rapid response RN called for support at bedside; MD paged; awaiting call back.  Patient transferred back to bed using the steady; she remained alert through out the transfer.  Reassessed back in bed; vital signs and CBG. Continue to monitor. ?

## 2021-07-14 NOTE — Significant Event (Signed)
Rapid Response Event Note  ? ?Reason for Call :  ?Hypotension ? ?Initial Focused Assessment:  ?NT was collecting vitals and noted that this patient hypotensive. Patient in chair and complains of feeling tired. Patient returned back to bed. She is pale and her mucous membranes are dry. ? ?CBG 81 ?BP 76/31 ?O2 95 ?HR 67 ? ?500 cc NS bolus administered ?MD agrees if BP is soft, we should start her on Midodrine 5 mg TID. Hold midodrine if SBP is >120.  ?Hold medications that are sedating (narcotics)  ?Hold scheduled BP medications until BP normalizes ? ? ?Event Summary:  ? ?MD Notified: Dr. Reatha Armour ?Call Time: 1220 ?Arrival Time: 9379 ?End Time:1315 ? ?Venetia Maxon, RN ?

## 2021-07-15 LAB — GLUCOSE, CAPILLARY
Glucose-Capillary: 46 mg/dL — ABNORMAL LOW (ref 70–99)
Glucose-Capillary: 53 mg/dL — ABNORMAL LOW (ref 70–99)
Glucose-Capillary: 63 mg/dL — ABNORMAL LOW (ref 70–99)
Glucose-Capillary: 71 mg/dL (ref 70–99)
Glucose-Capillary: 141 mg/dL — ABNORMAL HIGH (ref 70–99)
Glucose-Capillary: 94 mg/dL (ref 70–99)
Glucose-Capillary: 50 mg/dL — ABNORMAL LOW (ref 70–99)
Glucose-Capillary: 66 mg/dL — ABNORMAL LOW (ref 70–99)
Glucose-Capillary: 72 mg/dL (ref 70–99)
Glucose-Capillary: 107 mg/dL — ABNORMAL HIGH (ref 70–99)

## 2021-07-15 NOTE — Plan of Care (Signed)
Pt is alert oriented x 4. Pt has slept well through night. Pt did c/o pain to right leg and was resolved by reposition to opposite site. PRN tylenol given for pain and robaxin for muscle spasms. Pt is on 3L nasal cannula. IV fluids continued per order.  CBG was low this am, now at 71. Pt has had 75% of breakfast No distress noted.  ? ? ? ?Problem: Education: ?Goal: Required Educational Video(s) ?Outcome: Progressing ?  ?Problem: Clinical Measurements: ?Goal: Ability to maintain clinical measurements within normal limits will improve ?Outcome: Progressing ?Goal: Postoperative complications will be avoided or minimized ?Outcome: Progressing ?  ?Problem: Skin Integrity: ?Goal: Demonstration of wound healing without infection will improve ?Outcome: Progressing ?  ?Problem: Education: ?Goal: Knowledge of General Education information will improve ?Description: Including pain rating scale, medication(s)/side effects and non-pharmacologic comfort measures ?Outcome: Progressing ?  ?Problem: Health Behavior/Discharge Planning: ?Goal: Ability to manage health-related needs will improve ?Outcome: Progressing ?  ?Problem: Clinical Measurements: ?Goal: Ability to maintain clinical measurements within normal limits will improve ?Outcome: Progressing ?Goal: Will remain free from infection ?Outcome: Progressing ?Goal: Diagnostic test results will improve ?Outcome: Progressing ?Goal: Respiratory complications will improve ?Outcome: Progressing ?Goal: Cardiovascular complication will be avoided ?Outcome: Progressing ?  ?Problem: Nutrition: ?Goal: Adequate nutrition will be maintained ?Outcome: Progressing ?  ?Problem: Coping: ?Goal: Level of anxiety will decrease ?Outcome: Progressing ?  ?Problem: Elimination: ?Goal: Will not experience complications related to bowel motility ?Outcome: Progressing ?Goal: Will not experience complications related to urinary retention ?Outcome: Progressing ?  ?Problem: Pain Managment: ?Goal: General  experience of comfort will improve ?Outcome: Progressing ?  ?Problem: Safety: ?Goal: Ability to remain free from injury will improve ?Outcome: Progressing ?  ?Problem: Skin Integrity: ?Goal: Risk for impaired skin integrity will decrease ?Outcome: Progressing ?  ?

## 2021-07-15 NOTE — Progress Notes (Signed)
? ?  Providing Compassionate, Quality Care - Together ? ?NEUROSURGERY PROGRESS NOTE ? ? ?S: Episode of hypotension yesterday while working with therapy.  Bolus given, midodrine given.  Complaining of some right posterior thigh and calf pain. ? ?O: EXAM:  ?BP 120/84 (BP Location: Left Arm)   Pulse 71   Temp (!) 97.4 ?F (36.3 ?C) (Oral)   Resp 18   Ht 5' (1.524 m)   Wt 108.9 kg   SpO2 100%   BMI 46.87 kg/m?  ? ?Awake, alert, oriented x3 ?PERRL ?Speech fluent, appropriate  ?CNs grossly intact  ?5/5 BUE/BLE  ?Incision clean dry and intact ? ?ASSESSMENT:  ?71 y.o. female with  ?L4-5 spondylosis ?-Status post L4-5 TLIF, postop day 3 ?  ?PLAN: ?  ?-Rehab pending ?-IV fluids ?-Antihypertensives and narcotics discontinued due to hypotension, will continue to monitor ? ? ? ?Thank you for allowing me to participate in this patient's care.  Please do not hesitate to call with questions or concerns. ? ? ?Norene Oliveri, DO ?Neurosurgeon ?[facility] ?Cell: [phone] ? ?

## 2021-07-15 NOTE — Progress Notes (Signed)
Hypoglycemic Event ? ?CBG: 50 ? ?Treatment: 8 oz juice/soda ? ?Symptoms: None ? ?Follow-up CBG: Time:1755 CBG Result:72  ? ?Possible Reasons for Event: Unknown ? ?Comments/MD notified: Hold all diabetic medications per [place]  ? ? ? ?Norma [name], [place] ? ? ?

## 2021-07-15 NOTE — Progress Notes (Signed)
0609 CBG 53,  Pt is awake alert oriented x 4. No distress noted. CBG rechecked 46. Pt given apple juice, graham crackers. CBG rechecked 5183. Another apple juice given, pts breakfast tray arrived. CBG rechecked 71. Pt ate 75% of breakfast.  ?

## 2021-07-16 LAB — GLUCOSE, CAPILLARY
Glucose-Capillary: 139 mg/dL — ABNORMAL HIGH (ref 70–99)
Glucose-Capillary: 158 mg/dL — ABNORMAL HIGH (ref 70–99)
Glucose-Capillary: 88 mg/dL (ref 70–99)
Glucose-Capillary: 120 mg/dL — ABNORMAL HIGH (ref 70–99)
Glucose-Capillary: 127 mg/dL — ABNORMAL HIGH (ref 70–99)

## 2021-07-16 NOTE — Progress Notes (Signed)
Inpatient Rehab Admissions Coordinator:  ? ?Per therapy recommendations,  patient was screened for CIR candidacy by [place] [employer], MS, CCC-SLP. At this time, Pt. Appears to be a a potential candidate for CIR. I will place   order for rehab consult per protocol for full assessment. Please contact me any with questions. ? ?Trany Chernick, MS, CCC-SLP ?Rehab Admissions Coordinator  ?[phone] (celll) ?[phone] (office) ? ?

## 2021-07-16 NOTE — Progress Notes (Signed)
Pt placed on CPAP for the night. TTOL WELL ?

## 2021-07-16 NOTE — Progress Notes (Signed)
Physical Therapy Treatment ?Patient Details ?Name: Norma [name] ?MRN: [id number] ?DOB: 18-Jul-1950 ?Today's Date: 07/16/2021 ? ? ?History of Present Illness Pt is a 71 y/o female presenting 3/30 wit back pain and lumbar radiculopathy, s/p R L 4-5 lumbar interbody fusion, PLA L 4-5, fixation, microdissection and grafting.[name]  PMHx:  DM, dHF, sHF, finger sx, obesity with alveolar hypoventilation, OA bil kneesCAD with CABGx3, CVA, CM, bipolar d/o, RLS, OSA, LBBB ? ?  ?PT Comments  ? ? Pt continues with 10/10 R hip radiating pain with R LE WBing inhibiting ability to stand and ambulate. Pt  has not been able to ambulate since before surgery due to onset of R LE pain greatly limiting progression towards all goals and progression of function. Acute PT to cont to follow. ?  ?Recommendations for follow up therapy are one component of a multi-disciplinary discharge planning process, led by the attending physician.  Recommendations may be updated based on patient status, additional functional criteria and insurance authorization. ? ?Follow Up Recommendations ? Skilled nursing-short term rehab (<3 hours/day) ?  ?  ?Assistance Recommended at Discharge Frequent or constant Supervision/Assistance  ?Patient can return home with the following Assistance with cooking/housework;Assist for transportation;Help with stairs or ramp for entrance;A lot of help with walking and/or transfers;A lot of help with bathing/dressing/bathroom ?  ?Equipment Recommendations ? Other (comment)  ?  ?Recommendations for Other Services   ? ? ?  ?Precautions / Restrictions Precautions ?Precautions: Back ?Precaution Booklet Issued: No ?Precaution Comments: pt unable to recall precautions, pt re-educated ?Required Braces or Orthoses: Spinal Brace ?Spinal Brace: Lumbar corset;Applied in sitting position ?Restrictions ?Weight Bearing Restrictions: No ?Other Position/Activity Restrictions: pt self R LE NWB due to onset of pain with R LE WBing  ?  ? ?Mobility ? Bed  Mobility ?  ?  ?  ?  ?  ?  ?  ?General bed mobility comments: pt received up in chair with OT upon arrival ?  ? ?Transfers ?Overall transfer level: Needs assistance ?Equipment used: Rolling walker (2 wheels) (2 person face to face) ?Transfers: Sit to/from Stand ?Sit to Stand: Mod assist, Max assist, +2 physical assistance ?  ?  ?  ?  ?  ?General transfer comment: pt with immediate pain in R HIP upon standing, maxAx2 to power up, attempetd x 4. LSO adjusted to lower fitting closer to hips and last sit to stand was the best and pt able to actually put R foot flat, otherwise pt resting R LE toes on the floor only. pt with improved standing ability with face to face transfer than RW ?  ? ?Ambulation/Gait ?  ?  ?  ?  ?  ?  ?  ?General Gait Details: pt unable to ambulate due to inability to tolerate R LE WBing ? ? ?Stairs ?  ?  ?  ?  ?  ? ? ?Wheelchair Mobility ?  ? ?Modified Rankin (Stroke Patients Only) ?  ? ? ?  ?Balance Overall balance assessment: Needs assistance ?Sitting-balance support: Single extremity supported, No upper extremity supported, Feet supported ?Sitting balance-Leahy Scale: Fair ?  ?  ?Standing balance support: Bilateral upper extremity supported, Reliant on assistive device for balance ?Standing balance-Leahy Scale: Poor ?Standing balance comment: reliant on AD and external support ?  ?  ?  ?  ?  ?  ?  ?  ?  ?  ?  ?  ? ?  ?Cognition Arousal/Alertness: Awake/alert ?Behavior During Therapy: [facility] for tasks assessed/performed ?Overall Cognitive Status: Within  Functional Limits for tasks assessed ?  ?  ?  ?  ?  ?  ?  ?  ?  ?  ?  ?  ?  ?  ?  ?  ?General Comments: pt emotional regarding pain and inability to mobilize due to the pain ?  ?  ? ?  ?Exercises   ? ?  ?General Comments General comments (skin integrity, edema, etc.): VSS ?  ?  ? ?Pertinent Vitals/Pain Pain Assessment ?Pain Assessment: 0-10 ?Pain Score: 10-Worst pain ever ?Pain Location: R hip pain radiating down the leg when in WBing ?Pain  Descriptors / Indicators: Discomfort, Sore, Aching, Grimacing ?Pain Intervention(s): Limited activity within patient's tolerance  ? ? ?Home Living   ?  ?  ?  ?  ?  ?  ?  ?  ?  ?   ?  ?Prior Function    ?  ?  ?   ? ?PT Goals (current goals can now be found in the care plan section) Acute Rehab PT Goals ?Patient Stated Goal: stop the pain ?Progress towards PT goals: Not progressing toward goals - comment (limited by R hip pain) ? ?  ?Frequency ? ? ? Min 3X/week ? ? ? ?  ?PT Plan Current plan remains appropriate;Frequency needs to be updated  ? ? ?Co-evaluation   ?  ?  ?  ?  ? ?  ?AM-PAC PT "6 Clicks" Mobility   ?Outcome Measure ? Help needed turning from your back to your side while in a flat bed without using bedrails?: A Lot ?Help needed moving from lying on your back to sitting on the side of a flat bed without using bedrails?: A Lot ?Help needed moving to and from a bed to a chair (including a wheelchair)?: A Lot ?Help needed standing up from a chair using your arms (e.g., wheelchair or bedside chair)?: A Lot ?Help needed to walk in hospital room?: Total ?Help needed climbing 3-5 steps with a railing? : Total ?6 Click Score: 10 ? ?  ?End of Session Equipment Utilized During Treatment: Back brace ?Activity Tolerance: Patient tolerated treatment well;Patient limited by pain;Patient limited by fatigue ?Patient left: in bed;with call bell/phone within reach;with bed alarm set;with SCD's reapplied ?Nurse Communication: Mobility status ?PT Visit Diagnosis: Other abnormalities of gait and mobility (R26.89);Pain;Difficulty in walking, not elsewhere classified (R26.2) ?Pain - Right/Left: Right ?Pain - part of body: Leg ?  ? ? ?Time: 3329-5188 ?PT Time Calculation (min) (ACUTE ONLY): 20 min ? ?Charges:  $Therapeutic Activity: 8-22 mins          ?          ? ?Norma [name], PT, DPT ?Acute Rehabilitation Services ?Secure chat preferred ?Office #: [phone] ? ? ? ?Norma [name] M Norma [name] ?07/16/2021, 1:15 PM ? ?

## 2021-07-16 NOTE — Progress Notes (Signed)
Inpatient Rehab Admissions Coordinator:  ? ?I spoke with pt. Regarding potential CIR admit. She is interested but wants to take the evening to think it over. I will touch base with her in the morning and see if she has made a decision. ? ?Clemens Catholic, MS, CCC-SLP ?Rehab Admissions Coordinator  ?[phone] (celll) ?[phone] (office) ? ?

## 2021-07-16 NOTE — Progress Notes (Signed)
Inpatient Diabetes Program Recommendations ? ?AACE/ADA: New Consensus Statement on Inpatient Glycemic Control (2015) ? ?Target Ranges:  Prepandial:   less than 140 mg/dL ?     Peak postprandial:   less than 180 mg/dL (1-2 hours) ?     Critically ill patients:  140 - 180 mg/dL  ? ?Lab Results  ?Component Value Date  ? GLUCAP 139 (H) 07/16/2021  ? HGBA1C 7.4 (H) 07/05/2021  ? ? ?Review of Glycemic Control ? Latest Reference Range & Units 07/15/21 16:58 07/15/21 17:33 07/15/21 17:51 07/15/21 20:35 07/16/21 06:12  ?Glucose-Capillary 70 - 99 mg/dL 50 (L) 66 (L) 72 107 (H) 139 (H)  ?(L): Data is abnormally low ?(H): Data is abnormally high ?Diabetes history: Type 2 DM ?Outpatient Diabetes medications: Jardiance 10 mg QD, Glucotrol 10 mg QD, Metformin 500 mg BID ?Current orders for Inpatient glycemic control: Novolog 0-15 units TID, Novolog 0-5 unties QHS, Glucotrol 5 mg BID, Jardiance 10 mg QD, Metformin 500 mg BID ? ?Inpatient Diabetes Program Recommendations:   ? ?Noted hypoglycemia of 50 mg/dL. Consider discontinuing Glipizide while inpatient.  ? ?Thanks, ?Bronson Curb, MSN, RNC-OB ?Diabetes Coordinator ?[phone] (8a-5p) ? ? ? ? ?

## 2021-07-16 NOTE — Care Management Important Message (Signed)
Important Message ? ?Patient Details  ?Name: Norma [name] ?MRN: [id number] ?Date of Birth: [birth year] ? ? ?Medicare Important Message Given:  Yes ? ? ? ? ?Jorma Tassinari ?07/16/2021, 2:36 PM ?

## 2021-07-16 NOTE — Progress Notes (Addendum)
Subjective: ?Patient reports that "I feel rough." She is having RLE pain that shoots into lower extremity with movement. No acute events overnight.  ? ?Objective: ?Vital signs in last 24 hours: ?Temp:  [97.7 ?F (36.5 ?C)-98.3 ?F (36.8 ?C)] 98.3 ?F (36.8 ?C) (04/03 9924) ?Pulse Rate:  [70-98] 78 (04/03 0400) ?Resp:  [18-22] 18 (04/03 0400) ?BP: (86-120)/(52-84) 86/57 (04/03 0400) ?SpO2:  [96 %-100 %] 100 % (04/03 0338) ? ?Intake/Output from previous day: ?04/02 0701 - 04/03 0700 ?In: -  ?Out: 1300 [Urine:1300] ?Intake/Output this shift: ?No intake/output data recorded. ? ?Physical Exam: Patient is awake, A/O X 4, conversant, and in good spirits. They are in NAD and VSS. Doing well. Speech is fluent and appropriate. MAEW with good strength that is symmetric bilaterally.  BUE 5/5 throughout, BLE 5/5 throughout. Sensation to light touch is intact. PERLA, EOMI. CNs grossly intact. Dressing is clean dry intact. Incision is well approximated with no drainage, erythema, or edema. ?  ? ? ?Lab Results: ?No results for input(s): WBC, HGB, HCT, PLT in the last 72 hours. ?BMET ?No results for input(s): NA, K, CL, CO2, GLUCOSE, BUN, CREATININE, CALCIUM in the last 72 hours. ? ?Studies/Results: ?No results found. ? ?Assessment/Plan: ?Patient is s/p L4/5 right MIS TLIF. She is continuing to recover well from her surgery. She has mild incisional discomfort that is appropriate. Episodes of hypoglycemia and hypotension over the weekend necessitated the removal of narcotic pain medicines and subsequently led to an IVF bolus and midodrine. BPs have been more stable although there have been some that have continued to be on the softer side. She has with PT/OT. Recommendation for SNF at discharge. Continue LSO brace when OOB. Continue working on pain control, mobility and ambulating patient. Awaiting SNF placement. . ?  ?-Rehab pending ?-IV fluids ?-Antihypertensives and narcotics discontinued due to hypotension, will continue to  monitor ?  ?  ?  ? LOS: 4 days  ? ? ? ?Marvis Moeller, DNP, NP-C ?07/16/2021, 8:02 AM ? ? ? ? ?

## 2021-07-16 NOTE — Progress Notes (Signed)
Occupational Therapy Treatment ?Patient Details ?Name: Norma [name] ?MRN: [id number] ?DOB: 1950-05-28 ?Today's Date: 07/16/2021 ? ? ?History of present illness Pt is a 71 y/o female presenting 3/30 wit back pain and lumbar radiculopathy, s/p R L 4-5 lumbar interbody fusion, PLA L 4-5, fixation, microdissection and grafting.[name]  PMHx:  DM, dHF, sHF, finger sx, obesity with alveolar hypoventilation, OA bil kneesCAD with CABGx3, CVA, CM, bipolar d/o, RLS, OSA, LBBB ?  ?OT comments ? Patient on [facility] upon entry.  Patient stood from [facility] with face to face technique with patient demonstrating posterior leaning and requiring assistance for balance while nursing tech assisted with hygiene. Patient transferred to recliner with stand pivot transfer. Patient performed grooming in recliner at sink with less pain. Acute OT to continue to follow.   ? ?Recommendations for follow up therapy are one component of a multi-disciplinary discharge planning process, led by the attending physician.  Recommendations may be updated based on patient status, additional functional criteria and insurance authorization. ?   ?Follow Up Recommendations ? Acute inpatient rehab (3hours/day)  ?  ?Assistance Recommended at Discharge Frequent or constant Supervision/Assistance  ?Patient can return home with the following ? A lot of help with walking and/or transfers;A lot of help with bathing/dressing/bathroom;Direct supervision/assist for medications management ?  ?Equipment Recommendations ? Other (comment) (TBD)  ?  ?Recommendations for Other Services   ? ?  ?Precautions / Restrictions Precautions ?Precautions: Back ?Precaution Booklet Issued: No ?Precaution Comments: pt unable to recall precautions, pt re-educated ?Required Braces or Orthoses: Spinal Brace ?Spinal Brace: Lumbar corset;Applied in sitting position ?Restrictions ?Weight Bearing Restrictions: No ?Other Position/Activity Restrictions: pt self R LE NWB due to onset of pain with R LE WBing   ? ? ?  ? ?Mobility Bed Mobility ?Overal bed mobility: Needs Assistance ?  ?  ?  ?  ?  ?  ?General bed mobility comments: patient on [facility] upon arrival and in recliner a end of session ?  ? ?Transfers ?  ?  ?  ?  ?Stand pivot transfers: Mod assist ?  ?  ?  ?  ?General transfer comment: Patient was mod assist to transfer from [facility] to recliner with difficulty shifting weight. ?  ?  ?Balance   ?  ?  ?  ?  ?  ?  ?  ?  ?  ?  ?  ?  ?  ?  ?  ?  ?  ?  ?   ? ?ADL either performed or assessed with clinical judgement  ? ?ADL Overall ADL's : Needs assistance/impaired ?  ?  ?Grooming: Wash/dry hands;Wash/dry face;Brushing hair;Set up;Sitting ?  ?  ?  ?  ?  ?  ?  ?  ?  ?Toilet Transfer: Moderate assistance;Stand-pivot ?Toilet Transfer Details (indicate cue type and reason): difficulty pivoting due to possible fear of falling ?Toileting- Clothing Manipulation and Hygiene: Maximal assistance ?Toileting - Clothing Manipulation Details (indicate cue type and reason): Patient stood with assistance of therapist and tech assisted with toilet hygiene ?  ?  ?  ?General ADL Comments: patient had complaints of back and right hip pain while seated on BSC ?  ? ?Extremity/Trunk Assessment   ?  ?  ?  ?  ?  ? ?Vision   ?  ?  ?Perception   ?  ?Praxis   ?  ? ?Cognition   ?  ?  ?  ?  ?  ?  ?  ?  ?  ?  ?  ?  ?  ?  ?  ?  ?  ?  ?  General Comments: Patient appeared fearful of standing at [facility] and transfer to recliner ?  ?  ?   ?Exercises   ? ?  ?Shoulder Instructions   ? ? ?  ?General Comments VSS  ? ? ?Pertinent Vitals/ Pain         ? ?Home Living   ?  ?  ?  ?  ?  ?  ?  ?  ?  ?  ?  ?  ?  ?  ?  ?  ?  ?  ? ?  ?Prior Functioning/Environment    ?  ?  ?  ?   ? ?Frequency ? Min 2X/week  ? ? ? ? ?  ?Progress Toward Goals ? ?OT Goals(current goals can now be found in the care plan section) ? Progress towards OT goals: Progressing toward goals ? ?Acute Rehab OT Goals ?Patient Stated Goal: get better with less pain ?OT Goal Formulation: With patient ?Time For Goal  Achievement: 07/27/21 ?Potential to Achieve Goals: Good ?ADL Goals ?Pt Will Perform Lower Body Bathing: with supervision;sitting/lateral leans;sit to/from stand ?Pt Will Perform Lower Body Dressing: with supervision;sitting/lateral leans;sit to/from stand;with adaptive equipment ?Pt Will Transfer to Toilet: with supervision;ambulating ?Pt Will Perform Toileting - Clothing Manipulation and hygiene: with supervision;sitting/lateral leans;sit to/from stand;with adaptive equipment ?Additional ADL Goal #1: Pt will tolerate standing for 5 mins to complete grooming at the sink with no rest break and supervision.  ?Plan Discharge plan remains appropriate   ? ?Co-evaluation ? ? ?   ?  ?  ?  ?  ? ?  ?AM-PAC OT "6 Clicks" Daily Activity     ?Outcome Measure ? ? Help from another person eating meals?: A Little ?Help from another person taking care of personal grooming?: A Little ?Help from another person toileting, which includes using toliet, bedpan, or urinal?: A Lot ?Help from another person bathing (including washing, rinsing, drying)?: A Lot ?Help from another person to put on and taking off regular upper body clothing?: A Little ?Help from another person to put on and taking off regular lower body clothing?: A Lot ?6 Click Score: 15 ? ?  ?End of Session Equipment Utilized During Treatment: Rolling walker (2 wheels);Back brace ? ?OT Visit Diagnosis: Unsteadiness on feet (R26.81);Other abnormalities of gait and mobility (R26.89);Muscle weakness (generalized) (M62.81) ?  ?Activity Tolerance Patient limited by pain ?  ?Patient Left in chair;with call bell/phone within reach ?  ?Nurse Communication Mobility status ?  ? ?   ? ?Time: 1607-3710 ?OT Time Calculation (min): 30 min ? ?Charges: OT General Charges ?$OT Visit: 1 Visit ?OT Treatments ?$Self Care/Home Management : 8-22 mins ? ?Lodema Hong, OTA ?Acute Rehabilitation Services  ?Pager [phone] ?Office [phone] ? ? ?[place] ?07/16/2021, 1:37 PM ?

## 2021-07-17 DIAGNOSIS — F319 Bipolar disorder, unspecified: Secondary | ICD-10-CM | POA: Diagnosis not present

## 2021-07-17 DIAGNOSIS — J449 Chronic obstructive pulmonary disease, unspecified: Secondary | ICD-10-CM | POA: Diagnosis not present

## 2021-07-17 DIAGNOSIS — F330 Major depressive disorder, recurrent, mild: Secondary | ICD-10-CM | POA: Diagnosis not present

## 2021-07-17 DIAGNOSIS — Z48811 Encounter for surgical aftercare following surgery on the nervous system: Secondary | ICD-10-CM | POA: Diagnosis not present

## 2021-07-17 DIAGNOSIS — E785 Hyperlipidemia, unspecified: Secondary | ICD-10-CM | POA: Diagnosis not present

## 2021-07-17 DIAGNOSIS — M549 Dorsalgia, unspecified: Secondary | ICD-10-CM | POA: Diagnosis not present

## 2021-07-17 DIAGNOSIS — R1319 Other dysphagia: Secondary | ICD-10-CM | POA: Diagnosis not present

## 2021-07-17 DIAGNOSIS — R531 Weakness: Secondary | ICD-10-CM | POA: Diagnosis not present

## 2021-07-17 DIAGNOSIS — G894 Chronic pain syndrome: Secondary | ICD-10-CM | POA: Diagnosis not present

## 2021-07-17 DIAGNOSIS — Z9989 Dependence on other enabling machines and devices: Secondary | ICD-10-CM | POA: Diagnosis not present

## 2021-07-17 DIAGNOSIS — G4733 Obstructive sleep apnea (adult) (pediatric): Secondary | ICD-10-CM | POA: Diagnosis not present

## 2021-07-17 DIAGNOSIS — G4734 Idiopathic sleep related nonobstructive alveolar hypoventilation: Secondary | ICD-10-CM | POA: Diagnosis not present

## 2021-07-17 DIAGNOSIS — E1140 Type 2 diabetes mellitus with diabetic neuropathy, unspecified: Secondary | ICD-10-CM | POA: Diagnosis not present

## 2021-07-17 DIAGNOSIS — I5032 Chronic diastolic (congestive) heart failure: Secondary | ICD-10-CM | POA: Diagnosis not present

## 2021-07-17 DIAGNOSIS — M6281 Muscle weakness (generalized): Secondary | ICD-10-CM | POA: Diagnosis not present

## 2021-07-17 DIAGNOSIS — Z7401 Bed confinement status: Secondary | ICD-10-CM | POA: Diagnosis not present

## 2021-07-17 DIAGNOSIS — R2689 Other abnormalities of gait and mobility: Secondary | ICD-10-CM | POA: Diagnosis not present

## 2021-07-17 DIAGNOSIS — F418 Other specified anxiety disorders: Secondary | ICD-10-CM | POA: Diagnosis not present

## 2021-07-17 DIAGNOSIS — E119 Type 2 diabetes mellitus without complications: Secondary | ICD-10-CM | POA: Diagnosis not present

## 2021-07-17 DIAGNOSIS — I10 Essential (primary) hypertension: Secondary | ICD-10-CM | POA: Diagnosis not present

## 2021-07-17 LAB — GLUCOSE, CAPILLARY
Glucose-Capillary: 74 mg/dL (ref 70–99)
Glucose-Capillary: 105 mg/dL — ABNORMAL HIGH (ref 70–99)

## 2021-07-17 MED ORDER — NYSTATIN 100000 UNIT/GM EX CREA: TOPICAL_CREAM | Freq: Two times a day (BID) | CUTANEOUS | 0 refills | Status: DC

## 2021-07-17 MED ORDER — TRAMADOL HCL 50 MG PO TABS: 100.0000 mg | ORAL_TABLET | Freq: Four times a day (QID) | ORAL | 0 refills | Status: DC | PRN

## 2021-07-17 MED ORDER — METHOCARBAMOL 500 MG PO TABS: 500.0000 mg | ORAL_TABLET | Freq: Four times a day (QID) | ORAL | 3 refills | Status: DC | PRN

## 2021-07-17 NOTE — Progress Notes (Signed)
Subjective: ?Patient reports that she is continuing to RLE pain that is most severe with movement. No acute events overnight.  ? ?Objective: ?Vital signs in last 24 hours: ?Temp:  [97.9 ?F (36.6 ?C)-98.3 ?F (36.8 ?C)] 97.9 ?F (36.6 ?C) (04/04 0419) ?Pulse Rate:  [71-78] 78 (04/04 0419) ?Resp:  [18-20] 20 (04/04 0419) ?BP: (109-124)/(50-81) 119/52 (04/04 0419) ?SpO2:  [95 %-100 %] 95 % (04/04 0419) ? ?Intake/Output from previous day: ?04/03 0701 - 04/04 0700 ?In: 453 [P.O.:450; I.V.:3] ?Out: 850 [Urine:850] ?Intake/Output this shift: ?No intake/output data recorded. ? ?Physical Exam: Patient is awake, A/O X 4, conversant, and in good spirits. They are in NAD and VSS. Doing well. Speech is fluent and appropriate. MAEW with good strength that is symmetric bilaterally.  BUE 5/5 throughout, BLE 5/5 throughout. Sensation to light touch is intact. PERLA, EOMI. CNs grossly intact. Dressing is clean dry intact. Incision is well approximated with no drainage, erythema, or edema. ? ?Lab Results: ?No results for input(s): WBC, HGB, HCT, PLT in the last 72 hours. ?BMET ?No results for input(s): NA, K, CL, CO2, GLUCOSE, BUN, CREATININE, CALCIUM in the last 72 hours. ? ?Studies/Results: ?No results found. ? ?Assessment/Plan: ?Patient is s/p L4/5 right MIS TLIF. She is continuing to recover well from her surgery and is able to mobilize better. She has mild incisional discomfort that is appropriate and continues to report RLE pain with movoemnt. Episodes of hypoglycemia and hypotension over the weekend necessitated the removal of narcotic pain medicines and subsequently led to an IVF bolus and midodrine. BPs have been stable. She continues to have episodes pf hyperglycemia. Will stop Glipizide while per Diabetes Coordinator recommendation. She has worked with PT/OT. Recommendation for SNF at discharge. Continue LSO brace when OOB. Continue working on pain control, mobility and ambulating patient. Awaiting SNF placement. Patient  reports she would like to go to a facility in [place].  ? ? ?-Hold Glipizide  ?-Rehab pending ?-IV fluids ?-Antihypertensives and narcotics discontinued due to hypotension, will continue to monitor ?  ? LOS: 5 days  ? ? ? ?Marvis Moeller, DNP, NP-C ?07/17/2021, 8:08 AM ? ? ? ? ?

## 2021-07-17 NOTE — TOC Transition Note (Signed)
Transition of Care (TOC) - CM/SW Discharge Note ? ? ?Patient Details  ?Name: Norma [name] ?MRN: [id number] ?Date of Birth: Oct 20, 1950 ? ?Transition of Care [facility]) CM/SW Contact:  ?Geralynn Ochs, LCSW ?Phone Number: ?07/17/2021, 12:48 PM ? ? ?Clinical Narrative:   CSW confirmed with MD that patient is medically stable for discharge today, sent discharge information to [facility]. Bed is available for patient anytime after 1. Transport scheduled with [facility] for next available. ? ?Nurse to call report to Mariann Laster at [phone]. ? ? ? ?Final next level of care: [place] ?Barriers to Discharge: Barriers Resolved ? ? ?Patient Goals and CMS Choice ?Patient states their goals for this hospitalization and ongoing recovery are:: to get rehab ?CMS Medicare.gov Compare Post Acute Care list provided to:: Patient ?Choice offered to / list presented to : Patient ? ?Discharge Placement ?  ?           ?Patient [facility] bed at: [facility] ?Patient to be transferred to facility by: [facility] ?Name of family member notified: Self ?Patient and family notified of of transfer: 07/17/21 ? ?Discharge Plan and Services ?  ?  ?Post Acute Care Choice: [place]          ?  ?  ?  ?  ?  ?  ?  ?  ?  ?  ? ?Social Determinants of Health (SDOH) Interventions ?  ? ? ?Readmission Risk Interventions ?   ? View : No data to display.  ?  ?  ?  ? ? ? ? ? ?

## 2021-07-17 NOTE — Progress Notes (Signed)
RT in to assist pt with CPAP, CPAP machine was running, but mask was in the floor. Mask placed on pt, and O2 bled in. Pt tolerating well. Advised pt to notify for RN if she needed to come off of CPAP so her [place] can be hooked back to O2. RT will monitor as needed.  ?

## 2021-07-17 NOTE — TOC Progression Note (Signed)
Transition of Care (TOC) - Progression Note  ? ? ?Patient Details  ?Name: Norma [name] ?MRN: [id number] ?Date of Birth: [birth year] ? ?Transition of Care (TOC) CM/SW Contact  ?Geralynn Ochs, LCSW ?Phone Number: ?07/17/2021, 11:19 AM ? ?Clinical Narrative:   CSW spoke with [facility] and confirmed bed offer for patient. CSW sent updated information to [facility] and obtained patient's PASRR. Bed will be available for patient tomorrow if medically stable. CSW to follow. ? ? ? ?Expected Discharge Plan: [place] ?Barriers to Discharge: Continued Medical Work up ? ?Expected Discharge Plan and Services ?Expected Discharge Plan: [place] ?  ?  ?Post Acute Care Choice: [place] ?Living arrangements for the past 2 months: [place] ?                ?  ?  ?  ?  ?  ?  ?  ?  ?  ?  ? ? ?Social Determinants of Health (SDOH) Interventions ?  ? ?Readmission Risk Interventions ?   ? View : No data to display.  ?  ?  ?  ? ? ?

## 2021-07-17 NOTE — Progress Notes (Signed)
Report called to nurse at [facility]. ?

## 2021-07-17 NOTE — Progress Notes (Signed)
Inpatient Rehab Admissions Coordinator:  ? ? I met with Pt. Again to discuss CIR. She states that she does not wish to come to CIR and that she prefers [facility] in [place], [place]. CIR will sign off and I will notify TOC. ? ?Clemens Catholic, MS, CCC-SLP ?Rehab Admissions Coordinator  ?[phone] (celll) ?[phone] (office) ? ?

## 2021-07-17 NOTE — Discharge Summary (Signed)
Physician Discharge Summary  ?Patient ID: ?Norma [name] ?MRN: [id number] ?DOB/AGE: 01-04-1951 71 y.o. ? ?Admit date: 07/12/2021 ?Discharge date: 07/17/2021 ? ?Admission Diagnoses: Lumbar radiculopathy ? ?Discharge Diagnoses: Lumbar radiculopathy ?Principal Problem: ?  Lumbar radiculopathy ? ? ?Discharged Condition: good ? ?Hospital Course: The patient was admitted on 07/12/2021 and taken to the operating room where the patient underwent L4/5 right MIS TLIF. The patient tolerated the procedure well and was taken to the recovery room and then to the floor in stable condition. There were no complications. The wound remained clean dry and intact. Pt had appropriate back soreness. No complaints of new leg pain or new N/T/W. The patient remained afebrile with stable vital signs, and tolerated a regular diet. She had a few episodes of hypotension that resolved with fluid bolus, changes to her medications, and a short course of midodrine. She also had some mild hypoglycemia that subsequently led to discontinuation of her glipizide. Today, her blood pressure and blood sugars have been stable. The patient continued to increase activities, and pain was well controlled with oral pain medications. She is ready for discharge to SNF. ? ? ?Consults:  WOC ? ?Significant Diagnostic Studies: radiology: X-Ray: intraoperative ? ? ?Treatments: surgery:  ?1. Right L4-5 lumbar interbody fusion via transforaminal approach with tubular retractors ?2. Posterior L4-5 arthrodesis ?3. Placement of interbody cage L4-5 ?4. Nonsegmental instrumentation with percutaneously placed pedicle screw and rod construct at L4-5 ?5. Harvest of local autograft ?6. Use of morselized allograft ?7. Use of microscope for microdissection ? ?Discharge Exam: ?Blood pressure (!) 119/52, pulse 78, temperature 97.9 ?F (36.6 ?C), temperature source Oral, resp. rate 20, height 5' (1.524 m), weight 108.9 kg, SpO2 95 %. ? ?Physical Exam: Patient is awake, A/O X 4, conversant,  and in good spirits. They are in NAD and VSS. Doing well. Speech is fluent and appropriate. MAEW with good strength that is symmetric bilaterally.  BUE 5/5 throughout, BLE 5/5 throughout. Sensation to light touch is intact. PERLA, EOMI. CNs grossly intact. Dressing is clean dry intact. Incision is well approximated with no drainage, erythema, or edema. ? ?Disposition: Discharge disposition: 03-Skilled Nursing Facility ? ? ? ? ? ? ?Discharge Instructions   ? ? Incentive spirometry RT   Complete by: As directed ?  ? ?  ? ?Allergies as of 07/17/2021   ? ?   Reactions  ? Amoxicillin Anaphylaxis, Other (See Comments)  ? Has patient had a PCN reaction causing immediate rash, facial/tongue/throat swelling, SOB or lightheadedness with hypotension: Yes ?Has patient had a PCN reaction causing severe rash involving mucus membranes or skin necrosis: Yes ?Has patient had a PCN reaction that required hospitalization: Yes ?Has patient had a PCN reaction occurring within the last 10 years: Yes ?If all of the above answers are "NO", then may proceed with Cephalosporin use.  ? Hydrocodone Anaphylaxis  ? Percocet [oxycodone-acetaminophen] Other (See Comments)  ? Causes chest pain /tightness. Pressure pain around her ribs.  ? Vancomycin Itching  ? Depacon [valproate Sodium]   ? Depacon [valproic Acid] Other (See Comments)  ? Causes falls   ? ?  ? ?  ?Medication List  ?  ? ?TAKE these medications   ? ?ALPRAZolam 0.25 MG tablet ?Commonly known as: Duanne Moron ?Take 1 tablet (0.25 mg total) by mouth daily as needed for anxiety. ?What changed:  ?when to take this ?reasons to take this ?  ?aspirin EC 81 MG tablet ?Take 1 tablet (81 mg total) by mouth daily. ?  ?buPROPion 150  MG 24 hr tablet ?Commonly known as: WELLBUTRIN XL ?Take 1 tablet (150 mg total) by mouth every morning. ?  ?carvedilol 6.25 MG tablet ?Commonly known as: COREG ?TAKE ONE TABLET BY MOUTH TWICE DAILY ([place]) ?What changed: See the new instructions. ?  ?cetirizine 10 MG  tablet ?Commonly known as: ZYRTEC ?Take 1 tablet tablet 1-2 times daily. ?What changed:  ?how much to take ?how to take this ?when to take this ?additional instructions ?  ?diclofenac Sodium 1 % Gel ?Commonly known as: VOLTAREN ?Apply 2 g topically 4 (four) times daily as needed (pain). ?  ?diphenhydrAMINE 25 mg capsule ?Commonly known as: BENADRYL ?Take 25 mg by mouth as needed for allergies or itching. ?  ?empagliflozin 10 MG Tabs tablet ?Commonly known as: JARDIANCE ?Take 10 mg by mouth daily. ?  ?EPINEPHrine 0.3 mg/0.3 mL Soaj injection ?Commonly known as: EPI-PEN ?Inject 0.3 mg into the muscle once. ?  ?famotidine 20 MG tablet ?Commonly known as: PEPCID ?Take 20 mg by mouth at bedtime. ?  ?ferrous sulfate 325 (65 FE) MG tablet ?Take 325 mg by mouth daily after supper. ?  ?gabapentin 400 MG capsule ?Commonly known as: NEURONTIN ?One capsule in the morning and 2 in the evening ?What changed:  ?how much to take ?how to take this ?when to take this ?additional instructions ?  ?glipiZIDE 5 MG 24 hr tablet ?Commonly known as: GLUCOTROL XL ?TAKE ONE TABLET BY MOUTH DAILY WITH BREAKFAST ?What changed: when to take this ?  ?lamoTRIgine 150 MG tablet ?Commonly known as: LAMICTAL ?Take 1 tablet (150 mg total) by mouth at bedtime. ?  ?levothyroxine 112 MCG tablet ?Commonly known as: SYNTHROID ?Take 112 mcg by mouth daily before breakfast. ?  ?losartan 25 MG tablet ?Commonly known as: COZAAR ?TAKE ONE TABLET BY MOUTH DAILY (MORNING) ?What changed: See the new instructions. ?  ?meclizine 25 MG tablet ?Commonly known as: ANTIVERT ?Take 1 tablet (25 mg total) by mouth 3 (three) times daily as needed for dizziness. ?What changed: when to take this ?  ?metFORMIN 500 MG tablet ?Commonly known as: GLUCOPHAGE ?TAKE ONE TABLET BY MOUTH TWICE DAILY WITH A MEAL (MORNING ,EVENING) ?What changed: See the new instructions. ?  ?methocarbamol 500 MG tablet ?Commonly known as: ROBAXIN ?Take 1 tablet (500 mg total) by mouth every 6 (six)  hours as needed for muscle spasms. ?  ?nystatin cream ?Commonly known as: MYCOSTATIN ?Apply topically 2 (two) times daily. ?  ?ondansetron 4 MG tablet ?Commonly known as: ZOFRAN ?Take 1 tablet (4 mg total) by mouth every 8 (eight) hours as needed for nausea or vomiting. ?  ?OVER THE COUNTER MEDICATION ?Take 1 tablet by mouth daily as needed (pain). CBD Gummies ?  ?pantoprazole 40 MG tablet ?Commonly known as: PROTONIX ?Take 1 tablet (40 mg total) by mouth daily before supper. ?  ?potassium chloride SA 20 MEQ tablet ?Commonly known as: KLOR-CON M ?Take 20 mEq by mouth daily as needed (cramps). ?  ?Repatha SureClick 893 MG/ML Soaj ?Generic drug: Evolocumab ?INJECT ONE DOSE INTO SKIN EVERY 14 DAYS. ?What changed: See the new instructions. ?  ?rOPINIRole 3 MG tablet ?Commonly known as: REQUIP ?TAKE ONE TABLET BY MOUTH EVERY MORNING, ,NOON AND AT BEDTIME. (PRT PT MORNING,EVENING,BEDTIME) ?What changed:  ?how much to take ?how to take this ?when to take this ?additional instructions ?  ?tiZANidine 2 MG tablet ?Commonly known as: ZANAFLEX ?Take 2 mg by mouth every 8 (eight) hours as needed for muscle spasms. ?  ?torsemide 20 MG  tablet ?Commonly known as: DEMADEX ?Take 20 mg by mouth daily as needed (edema). ?  ?traMADol 50 MG tablet ?Commonly known as: ULTRAM ?Take 100 mg by mouth daily as needed for moderate pain. ?What changed: Another medication with the same name was added. Make sure you understand how and when to take each. ?  ?traMADol 50 MG tablet ?Commonly known as: ULTRAM ?Take 2 tablets (100 mg total) by mouth every 6 (six) hours as needed for moderate pain or severe pain. ?What changed: You were already taking a medication with the same name, and this prescription was added. Make sure you understand how and when to take each. ?  ?venlafaxine XR 150 MG 24 hr capsule ?Commonly known as: EFFEXOR-XR ?TAKE TWO (2) CAPSULES BY MOUTH AT BEDTIME ?What changed:  ?how much to take ?when to take this ?additional  instructions ?  ?Vitamin D (Ergocalciferol) 1.25 MG (50000 UNIT) Caps capsule ?Commonly known as: DRISDOL ?Take 50,000 Units by mouth every 7 (seven) days. Sunday ?  ? ?  ? ? ? ?Signed: ?Marvis Moeller, DNP, N

## 2021-07-18 DIAGNOSIS — F330 Major depressive disorder, recurrent, mild: Secondary | ICD-10-CM | POA: Diagnosis not present

## 2021-07-18 DIAGNOSIS — R531 Weakness: Secondary | ICD-10-CM | POA: Diagnosis not present

## 2021-07-18 DIAGNOSIS — I10 Essential (primary) hypertension: Secondary | ICD-10-CM | POA: Diagnosis not present

## 2021-07-18 DIAGNOSIS — E119 Type 2 diabetes mellitus without complications: Secondary | ICD-10-CM | POA: Diagnosis not present

## 2021-07-24 ENCOUNTER — Telehealth: Payer: Self-pay | Admitting: Internal Medicine

## 2021-07-24 NOTE — Telephone Encounter (Signed)
Pt c/o medication issue: ? ?1. Name of Medication: REPATHA SURECLICK 030 MG/ML SOAJ ? ?2. How are you currently taking this medication (dosage and times per day)? INJECT ONE DOSE INTO SKIN EVERY 14 DAYS.Patient taking differently: Inject 140 mg into the skin every 14 (fourteen) days. ? ?3. Are you having a reaction (difficulty breathing--STAT)? No ? ?4. What is your medication issue?  Pt states that's he was going through the [facility] and will no longer cover medication. Pt is wanting to know is there an alternate medication that cn be prescribed. Please advise ? ?

## 2021-07-25 ENCOUNTER — Ambulatory Visit: Payer: Medicare Other | Admitting: Neurology

## 2021-07-25 NOTE — Telephone Encounter (Signed)
HEALTHWELL ID ?[id number] ?  ?PATIENT ?Norma [name] ?  ?STATUS  ?Renewal ?  ?START DATE ?08/21/2021 ?  ?END DATE ?08/21/2022 ?  ?ASSISTANCE TYPE ?Co-pay ?  ?PAID ?$0.00 ?  ?PENDING ?$0.00 ?  ?BALANCE ?$2500.00 ? ?Pharmacy Card ?CARD NO. ?[id number] ?  ?CARD STATUS ?Active ?  ?BIN ?[id number] ?  ?PCN ?[id number] ?  ?PC GROUP ?[id number] ?  ?HELP DESK ?[phone] ?  ?PROVIDER ?PDMI ?  ?PROCESSOR ?PDMI ?

## 2021-07-25 NOTE — Telephone Encounter (Signed)
No PA needed ?Attempted in CMM ?Authorization already on file for this request.  ?Authorization starting on 04/15/2020 and ending on 04/14/2022. ? ?Called patient and notified her that current healthwell grant has >$500 remaining, good until 08/20/21 ? ?Renewed enrollment and will mail her info on this.  ?

## 2021-08-01 DIAGNOSIS — N189 Chronic kidney disease, unspecified: Secondary | ICD-10-CM | POA: Diagnosis not present

## 2021-08-01 DIAGNOSIS — M4316 Spondylolisthesis, lumbar region: Secondary | ICD-10-CM | POA: Diagnosis not present

## 2021-08-02 DIAGNOSIS — N1832 Chronic kidney disease, stage 3b: Secondary | ICD-10-CM | POA: Diagnosis not present

## 2021-08-02 DIAGNOSIS — R1310 Dysphagia, unspecified: Secondary | ICD-10-CM | POA: Diagnosis not present

## 2021-08-02 DIAGNOSIS — D509 Iron deficiency anemia, unspecified: Secondary | ICD-10-CM | POA: Diagnosis not present

## 2021-08-02 DIAGNOSIS — I25110 Atherosclerotic heart disease of native coronary artery with unstable angina pectoris: Secondary | ICD-10-CM | POA: Diagnosis not present

## 2021-08-02 DIAGNOSIS — E1143 Type 2 diabetes mellitus with diabetic autonomic (poly)neuropathy: Secondary | ICD-10-CM | POA: Diagnosis not present

## 2021-08-02 DIAGNOSIS — G8929 Other chronic pain: Secondary | ICD-10-CM | POA: Diagnosis not present

## 2021-08-02 DIAGNOSIS — E1142 Type 2 diabetes mellitus with diabetic polyneuropathy: Secondary | ICD-10-CM | POA: Diagnosis not present

## 2021-08-02 DIAGNOSIS — E782 Mixed hyperlipidemia: Secondary | ICD-10-CM | POA: Diagnosis not present

## 2021-08-02 DIAGNOSIS — R413 Other amnesia: Secondary | ICD-10-CM | POA: Diagnosis not present

## 2021-08-02 DIAGNOSIS — F319 Bipolar disorder, unspecified: Secondary | ICD-10-CM | POA: Diagnosis not present

## 2021-08-02 DIAGNOSIS — F411 Generalized anxiety disorder: Secondary | ICD-10-CM | POA: Diagnosis not present

## 2021-08-02 DIAGNOSIS — K219 Gastro-esophageal reflux disease without esophagitis: Secondary | ICD-10-CM | POA: Diagnosis not present

## 2021-08-02 DIAGNOSIS — G4733 Obstructive sleep apnea (adult) (pediatric): Secondary | ICD-10-CM | POA: Diagnosis not present

## 2021-08-02 DIAGNOSIS — I130 Hypertensive heart and chronic kidney disease with heart failure and stage 1 through stage 4 chronic kidney disease, or unspecified chronic kidney disease: Secondary | ICD-10-CM | POA: Diagnosis not present

## 2021-08-02 DIAGNOSIS — E039 Hypothyroidism, unspecified: Secondary | ICD-10-CM | POA: Diagnosis not present

## 2021-08-02 DIAGNOSIS — E1122 Type 2 diabetes mellitus with diabetic chronic kidney disease: Secondary | ICD-10-CM | POA: Diagnosis not present

## 2021-08-02 DIAGNOSIS — M160 Bilateral primary osteoarthritis of hip: Secondary | ICD-10-CM | POA: Diagnosis not present

## 2021-08-02 DIAGNOSIS — E662 Morbid (severe) obesity with alveolar hypoventilation: Secondary | ICD-10-CM | POA: Diagnosis not present

## 2021-08-02 DIAGNOSIS — E1169 Type 2 diabetes mellitus with other specified complication: Secondary | ICD-10-CM | POA: Diagnosis not present

## 2021-08-02 DIAGNOSIS — M4726 Other spondylosis with radiculopathy, lumbar region: Secondary | ICD-10-CM | POA: Diagnosis not present

## 2021-08-02 DIAGNOSIS — M48061 Spinal stenosis, lumbar region without neurogenic claudication: Secondary | ICD-10-CM | POA: Diagnosis not present

## 2021-08-02 DIAGNOSIS — G2581 Restless legs syndrome: Secondary | ICD-10-CM | POA: Diagnosis not present

## 2021-08-02 DIAGNOSIS — Z4789 Encounter for other orthopedic aftercare: Secondary | ICD-10-CM | POA: Diagnosis not present

## 2021-08-02 DIAGNOSIS — M4316 Spondylolisthesis, lumbar region: Secondary | ICD-10-CM | POA: Diagnosis not present

## 2021-08-02 DIAGNOSIS — I5042 Chronic combined systolic (congestive) and diastolic (congestive) heart failure: Secondary | ICD-10-CM | POA: Diagnosis not present

## 2021-08-06 DIAGNOSIS — M4316 Spondylolisthesis, lumbar region: Secondary | ICD-10-CM | POA: Diagnosis not present

## 2021-08-06 DIAGNOSIS — Z4789 Encounter for other orthopedic aftercare: Secondary | ICD-10-CM | POA: Diagnosis not present

## 2021-08-06 DIAGNOSIS — I130 Hypertensive heart and chronic kidney disease with heart failure and stage 1 through stage 4 chronic kidney disease, or unspecified chronic kidney disease: Secondary | ICD-10-CM | POA: Diagnosis not present

## 2021-08-06 DIAGNOSIS — M48061 Spinal stenosis, lumbar region without neurogenic claudication: Secondary | ICD-10-CM | POA: Diagnosis not present

## 2021-08-06 DIAGNOSIS — M4726 Other spondylosis with radiculopathy, lumbar region: Secondary | ICD-10-CM | POA: Diagnosis not present

## 2021-08-06 DIAGNOSIS — G8929 Other chronic pain: Secondary | ICD-10-CM | POA: Diagnosis not present

## 2021-08-08 DIAGNOSIS — M4316 Spondylolisthesis, lumbar region: Secondary | ICD-10-CM | POA: Diagnosis not present

## 2021-08-08 DIAGNOSIS — G8929 Other chronic pain: Secondary | ICD-10-CM | POA: Diagnosis not present

## 2021-08-08 DIAGNOSIS — M48061 Spinal stenosis, lumbar region without neurogenic claudication: Secondary | ICD-10-CM | POA: Diagnosis not present

## 2021-08-08 DIAGNOSIS — M4726 Other spondylosis with radiculopathy, lumbar region: Secondary | ICD-10-CM | POA: Diagnosis not present

## 2021-08-08 DIAGNOSIS — Z4789 Encounter for other orthopedic aftercare: Secondary | ICD-10-CM | POA: Diagnosis not present

## 2021-08-08 DIAGNOSIS — I130 Hypertensive heart and chronic kidney disease with heart failure and stage 1 through stage 4 chronic kidney disease, or unspecified chronic kidney disease: Secondary | ICD-10-CM | POA: Diagnosis not present

## 2021-08-09 DIAGNOSIS — M48061 Spinal stenosis, lumbar region without neurogenic claudication: Secondary | ICD-10-CM | POA: Diagnosis not present

## 2021-08-09 DIAGNOSIS — M4316 Spondylolisthesis, lumbar region: Secondary | ICD-10-CM | POA: Diagnosis not present

## 2021-08-09 DIAGNOSIS — Z4789 Encounter for other orthopedic aftercare: Secondary | ICD-10-CM | POA: Diagnosis not present

## 2021-08-09 DIAGNOSIS — M4726 Other spondylosis with radiculopathy, lumbar region: Secondary | ICD-10-CM | POA: Diagnosis not present

## 2021-08-09 DIAGNOSIS — G8929 Other chronic pain: Secondary | ICD-10-CM | POA: Diagnosis not present

## 2021-08-09 DIAGNOSIS — I130 Hypertensive heart and chronic kidney disease with heart failure and stage 1 through stage 4 chronic kidney disease, or unspecified chronic kidney disease: Secondary | ICD-10-CM | POA: Diagnosis not present

## 2021-08-12 DIAGNOSIS — E782 Mixed hyperlipidemia: Secondary | ICD-10-CM | POA: Diagnosis not present

## 2021-08-12 DIAGNOSIS — I5032 Chronic diastolic (congestive) heart failure: Secondary | ICD-10-CM | POA: Diagnosis not present

## 2021-08-12 DIAGNOSIS — E1140 Type 2 diabetes mellitus with diabetic neuropathy, unspecified: Secondary | ICD-10-CM | POA: Diagnosis not present

## 2021-08-12 DIAGNOSIS — N1832 Chronic kidney disease, stage 3b: Secondary | ICD-10-CM | POA: Diagnosis not present

## 2021-08-13 DIAGNOSIS — Z20822 Contact with and (suspected) exposure to covid-19: Secondary | ICD-10-CM | POA: Diagnosis not present

## 2021-08-15 DIAGNOSIS — M4726 Other spondylosis with radiculopathy, lumbar region: Secondary | ICD-10-CM | POA: Diagnosis not present

## 2021-08-15 DIAGNOSIS — G8929 Other chronic pain: Secondary | ICD-10-CM | POA: Diagnosis not present

## 2021-08-15 DIAGNOSIS — Z20822 Contact with and (suspected) exposure to covid-19: Secondary | ICD-10-CM | POA: Diagnosis not present

## 2021-08-15 DIAGNOSIS — Z4789 Encounter for other orthopedic aftercare: Secondary | ICD-10-CM | POA: Diagnosis not present

## 2021-08-15 DIAGNOSIS — M4316 Spondylolisthesis, lumbar region: Secondary | ICD-10-CM | POA: Diagnosis not present

## 2021-08-15 DIAGNOSIS — E875 Hyperkalemia: Secondary | ICD-10-CM | POA: Diagnosis not present

## 2021-08-15 DIAGNOSIS — I130 Hypertensive heart and chronic kidney disease with heart failure and stage 1 through stage 4 chronic kidney disease, or unspecified chronic kidney disease: Secondary | ICD-10-CM | POA: Diagnosis not present

## 2021-08-15 DIAGNOSIS — N189 Chronic kidney disease, unspecified: Secondary | ICD-10-CM | POA: Diagnosis not present

## 2021-08-15 DIAGNOSIS — I129 Hypertensive chronic kidney disease with stage 1 through stage 4 chronic kidney disease, or unspecified chronic kidney disease: Secondary | ICD-10-CM | POA: Diagnosis not present

## 2021-08-15 DIAGNOSIS — E1122 Type 2 diabetes mellitus with diabetic chronic kidney disease: Secondary | ICD-10-CM | POA: Diagnosis not present

## 2021-08-15 DIAGNOSIS — M48061 Spinal stenosis, lumbar region without neurogenic claudication: Secondary | ICD-10-CM | POA: Diagnosis not present

## 2021-08-16 DIAGNOSIS — E1122 Type 2 diabetes mellitus with diabetic chronic kidney disease: Secondary | ICD-10-CM | POA: Diagnosis not present

## 2021-08-16 DIAGNOSIS — R809 Proteinuria, unspecified: Secondary | ICD-10-CM | POA: Diagnosis not present

## 2021-08-16 DIAGNOSIS — N189 Chronic kidney disease, unspecified: Secondary | ICD-10-CM | POA: Diagnosis not present

## 2021-08-16 DIAGNOSIS — I129 Hypertensive chronic kidney disease with stage 1 through stage 4 chronic kidney disease, or unspecified chronic kidney disease: Secondary | ICD-10-CM | POA: Diagnosis not present

## 2021-08-16 DIAGNOSIS — E875 Hyperkalemia: Secondary | ICD-10-CM | POA: Diagnosis not present

## 2021-08-16 DIAGNOSIS — D638 Anemia in other chronic diseases classified elsewhere: Secondary | ICD-10-CM | POA: Diagnosis not present

## 2021-08-16 DIAGNOSIS — I5042 Chronic combined systolic (congestive) and diastolic (congestive) heart failure: Secondary | ICD-10-CM | POA: Diagnosis not present

## 2021-08-16 DIAGNOSIS — E1129 Type 2 diabetes mellitus with other diabetic kidney complication: Secondary | ICD-10-CM | POA: Diagnosis not present

## 2021-08-17 ENCOUNTER — Telehealth (HOSPITAL_COMMUNITY): Payer: Medicare Other | Admitting: Psychiatry

## 2021-08-20 DIAGNOSIS — M48061 Spinal stenosis, lumbar region without neurogenic claudication: Secondary | ICD-10-CM | POA: Diagnosis not present

## 2021-08-20 DIAGNOSIS — Z4789 Encounter for other orthopedic aftercare: Secondary | ICD-10-CM | POA: Diagnosis not present

## 2021-08-20 DIAGNOSIS — M4316 Spondylolisthesis, lumbar region: Secondary | ICD-10-CM | POA: Diagnosis not present

## 2021-08-20 DIAGNOSIS — G8929 Other chronic pain: Secondary | ICD-10-CM | POA: Diagnosis not present

## 2021-08-20 DIAGNOSIS — M4726 Other spondylosis with radiculopathy, lumbar region: Secondary | ICD-10-CM | POA: Diagnosis not present

## 2021-08-20 DIAGNOSIS — I130 Hypertensive heart and chronic kidney disease with heart failure and stage 1 through stage 4 chronic kidney disease, or unspecified chronic kidney disease: Secondary | ICD-10-CM | POA: Diagnosis not present

## 2021-08-21 DIAGNOSIS — G8929 Other chronic pain: Secondary | ICD-10-CM | POA: Diagnosis not present

## 2021-08-21 DIAGNOSIS — M4316 Spondylolisthesis, lumbar region: Secondary | ICD-10-CM | POA: Diagnosis not present

## 2021-08-21 DIAGNOSIS — Z4789 Encounter for other orthopedic aftercare: Secondary | ICD-10-CM | POA: Diagnosis not present

## 2021-08-21 DIAGNOSIS — M48061 Spinal stenosis, lumbar region without neurogenic claudication: Secondary | ICD-10-CM | POA: Diagnosis not present

## 2021-08-21 DIAGNOSIS — M4726 Other spondylosis with radiculopathy, lumbar region: Secondary | ICD-10-CM | POA: Diagnosis not present

## 2021-08-21 DIAGNOSIS — I130 Hypertensive heart and chronic kidney disease with heart failure and stage 1 through stage 4 chronic kidney disease, or unspecified chronic kidney disease: Secondary | ICD-10-CM | POA: Diagnosis not present

## 2021-08-22 DIAGNOSIS — M4726 Other spondylosis with radiculopathy, lumbar region: Secondary | ICD-10-CM | POA: Diagnosis not present

## 2021-08-22 DIAGNOSIS — Z4789 Encounter for other orthopedic aftercare: Secondary | ICD-10-CM | POA: Diagnosis not present

## 2021-08-22 DIAGNOSIS — M48061 Spinal stenosis, lumbar region without neurogenic claudication: Secondary | ICD-10-CM | POA: Diagnosis not present

## 2021-08-22 DIAGNOSIS — I130 Hypertensive heart and chronic kidney disease with heart failure and stage 1 through stage 4 chronic kidney disease, or unspecified chronic kidney disease: Secondary | ICD-10-CM | POA: Diagnosis not present

## 2021-08-22 DIAGNOSIS — G8929 Other chronic pain: Secondary | ICD-10-CM | POA: Diagnosis not present

## 2021-08-22 DIAGNOSIS — M4316 Spondylolisthesis, lumbar region: Secondary | ICD-10-CM | POA: Diagnosis not present

## 2021-08-23 ENCOUNTER — Telehealth: Payer: Self-pay | Admitting: Internal Medicine

## 2021-08-23 NOTE — Telephone Encounter (Signed)
Pt c/o medication issue: ? ?1. Name of Medication: REPATHA SURECLICK 150 MG/ML SOAJ ? ?2. How are you currently taking this medication (dosage and times per day)? INJECT ONE DOSE INTO SKIN EVERY 14 DAYS.Patient taking differently: Inject 140 mg into the skin every 14 (fourteen) days. ? ?3. Are you having a reaction (difficulty breathing--STAT)?  ? ?4. What is your medication issue? Patient called stating she used to get repatha through the [employer] Well Program, however since she is on Medicare they won't pay for it anymore.   ?

## 2021-08-23 NOTE — Telephone Encounter (Signed)
Attempted to call patient but unable to leave message ? ?Per 07/24/21 phone note, healthwell grant has been renewed and I mailed her the info with ID numbers, etc ? ?MyChart message sent with healthwell info ?

## 2021-08-23 NOTE — Telephone Encounter (Signed)
Returned call to patient who states that her healthwell grant for her [place] is up now. Patient would like to know what she should do from here. She states that medicare will not pay for her to get the [place]. Will forward to Dr. Rod Mae nurse for her to review and advise.  ? ?Patient aware and verbalized understanding.  ?

## 2021-08-24 DIAGNOSIS — M4316 Spondylolisthesis, lumbar region: Secondary | ICD-10-CM | POA: Diagnosis not present

## 2021-08-24 DIAGNOSIS — I130 Hypertensive heart and chronic kidney disease with heart failure and stage 1 through stage 4 chronic kidney disease, or unspecified chronic kidney disease: Secondary | ICD-10-CM | POA: Diagnosis not present

## 2021-08-24 DIAGNOSIS — M4726 Other spondylosis with radiculopathy, lumbar region: Secondary | ICD-10-CM | POA: Diagnosis not present

## 2021-08-24 DIAGNOSIS — G8929 Other chronic pain: Secondary | ICD-10-CM | POA: Diagnosis not present

## 2021-08-24 DIAGNOSIS — Z4789 Encounter for other orthopedic aftercare: Secondary | ICD-10-CM | POA: Diagnosis not present

## 2021-08-24 DIAGNOSIS — M48061 Spinal stenosis, lumbar region without neurogenic claudication: Secondary | ICD-10-CM | POA: Diagnosis not present

## 2021-08-28 DIAGNOSIS — M4726 Other spondylosis with radiculopathy, lumbar region: Secondary | ICD-10-CM | POA: Diagnosis not present

## 2021-08-28 DIAGNOSIS — G8929 Other chronic pain: Secondary | ICD-10-CM | POA: Diagnosis not present

## 2021-08-28 DIAGNOSIS — Z4789 Encounter for other orthopedic aftercare: Secondary | ICD-10-CM | POA: Diagnosis not present

## 2021-08-28 DIAGNOSIS — M48061 Spinal stenosis, lumbar region without neurogenic claudication: Secondary | ICD-10-CM | POA: Diagnosis not present

## 2021-08-28 DIAGNOSIS — I130 Hypertensive heart and chronic kidney disease with heart failure and stage 1 through stage 4 chronic kidney disease, or unspecified chronic kidney disease: Secondary | ICD-10-CM | POA: Diagnosis not present

## 2021-08-28 DIAGNOSIS — M4316 Spondylolisthesis, lumbar region: Secondary | ICD-10-CM | POA: Diagnosis not present

## 2021-08-28 DIAGNOSIS — M810 Age-related osteoporosis without current pathological fracture: Secondary | ICD-10-CM | POA: Diagnosis not present

## 2021-08-29 DIAGNOSIS — G8929 Other chronic pain: Secondary | ICD-10-CM | POA: Diagnosis not present

## 2021-08-29 DIAGNOSIS — M4316 Spondylolisthesis, lumbar region: Secondary | ICD-10-CM | POA: Diagnosis not present

## 2021-08-29 DIAGNOSIS — I130 Hypertensive heart and chronic kidney disease with heart failure and stage 1 through stage 4 chronic kidney disease, or unspecified chronic kidney disease: Secondary | ICD-10-CM | POA: Diagnosis not present

## 2021-08-29 DIAGNOSIS — M48061 Spinal stenosis, lumbar region without neurogenic claudication: Secondary | ICD-10-CM | POA: Diagnosis not present

## 2021-08-29 DIAGNOSIS — Z4789 Encounter for other orthopedic aftercare: Secondary | ICD-10-CM | POA: Diagnosis not present

## 2021-08-29 DIAGNOSIS — M4726 Other spondylosis with radiculopathy, lumbar region: Secondary | ICD-10-CM | POA: Diagnosis not present

## 2021-08-30 ENCOUNTER — Encounter (HOSPITAL_COMMUNITY): Payer: Self-pay | Admitting: Psychiatry

## 2021-08-30 ENCOUNTER — Telehealth (INDEPENDENT_AMBULATORY_CARE_PROVIDER_SITE_OTHER): Payer: Medicare Other | Admitting: Psychiatry

## 2021-08-30 DIAGNOSIS — F331 Major depressive disorder, recurrent, moderate: Secondary | ICD-10-CM

## 2021-08-30 DIAGNOSIS — I5042 Chronic combined systolic (congestive) and diastolic (congestive) heart failure: Secondary | ICD-10-CM | POA: Diagnosis not present

## 2021-08-30 DIAGNOSIS — I130 Hypertensive heart and chronic kidney disease with heart failure and stage 1 through stage 4 chronic kidney disease, or unspecified chronic kidney disease: Secondary | ICD-10-CM | POA: Diagnosis not present

## 2021-08-30 DIAGNOSIS — E1122 Type 2 diabetes mellitus with diabetic chronic kidney disease: Secondary | ICD-10-CM | POA: Diagnosis not present

## 2021-08-30 MED ORDER — LAMOTRIGINE 150 MG PO TABS: 150.0000 mg | ORAL_TABLET | Freq: Every day | ORAL | 2 refills | Status: DC

## 2021-08-30 MED ORDER — VENLAFAXINE HCL ER 150 MG PO CP24: 300.0000 mg | ORAL_CAPSULE | Freq: Every day | ORAL | 2 refills | Status: DC

## 2021-08-30 MED ORDER — BUPROPION HCL ER (XL) 150 MG PO TB24: 150.0000 mg | ORAL_TABLET | Freq: Every morning | ORAL | 2 refills | Status: DC

## 2021-08-30 MED ORDER — ALPRAZOLAM 0.25 MG PO TABS
0.2500 mg | ORAL_TABLET | Freq: Every evening | ORAL | 2 refills | Status: DC | PRN
Start: 2021-08-30 — End: 2021-11-26

## 2021-08-30 NOTE — Progress Notes (Signed)
Virtual Visit via Telephone Note  I connected with Norma [name] on 08/30/21 at 10:40 AM EDT by telephone and verified that I am speaking with the correct person using two identifiers.  Location: Patient: home Provider: office   I discussed the limitations, risks, security and privacy concerns of performing an evaluation and management service by telephone and the availability of in person appointments. I also discussed with the patient that there may be a patient responsible charge related to this service. The patient expressed understanding and agreed to proceed.      I discussed the assessment and treatment plan with the patient. The patient was provided an opportunity to ask questions and all were answered. The patient agreed with the plan and demonstrated an understanding of the instructions.   The patient was advised to call back or seek an in-person evaluation if the symptoms worsen or if the condition fails to improve as anticipated.  I provided 15 minutes of non-face-to-face time during this encounter.   [name], MD  [facility] MD/PA/NP OP Progress Note  08/30/2021 11:09 AM Norma [name]  MRN:  [id number]  Chief Complaint:  Chief Complaint  Patient presents with   Depression   Anxiety   Follow-up   HPI: This patient is a 71 year old single white female who lives alone in [place].  She is no longer working.  The patient returns for follow-up after 3 months regarding her depression and anxiety.  She states that she recently got "scammed."  A person called her claiming to be from [employer] stating that her count needed to be reinstated and he needed phones.  Unfortunately she gave him his her debit card number and he cleaned out all her Social Security check money.  She does not have much money left for the month.  She is afraid to tell her family because they may chastise her.  She has reported this to the bank and to the better business Bureau.  I reminded her not to talk to  people she does not know her answer calls from numbers that she is not recognizing.  She states this is happened to her 1 time before 2 years ago and she is "too nice."  She recently had back surgery in April and it seems to be helping .  She is more mobile now and can walk without the walker much of the time.  She is getting home PT.  She denies serious depression thoughts of self-harm or suicide.  She is mostly just angry with herself for making poor decisions. Visit Diagnosis:    ICD-10-CM   1. Moderate episode of recurrent major depressive disorder ([place])  F33.1       Past Psychiatric History: Patient was hospitalized in 2001 and went through a course of ECT.  She has been tried on Effexor Wellbutrin and Zyprexa  Past Medical History:  Past Medical History:  Diagnosis Date   Anemia    Anxiety    Bipolar disorder ([place])    Bulging lumbar disc    L3-4   Chronic daily headache    Chronic low back pain 09/20/2014   CKD (chronic kidney disease)    stage 3   COPD (chronic obstructive pulmonary disease) (HCC)    Coronary artery disease    CABG 2019   Degenerative arthritis    Depression    Diabetes mellitus, type II ([place])    Diabetic neuropathy ([place]) 09/16/2018   DM type 2 with diabetic peripheral neuropathy ([place]) 09/20/2014  Dyslipidemia    Dyspnea    Family history of adverse reaction to anesthesia    father had reaction to anesthesia medication per patient "they don't use it anymore"- unsure of reaction   Fatty liver    per pt report   Gastroparesis    GERD (gastroesophageal reflux disease)    Heart murmur    History of hiatal hernia    pt states she no longer has this   Hypothyroidism    IBS (irritable bowel syndrome)    LBBB (left bundle branch block)    Memory difficulty 09/20/2014   Morbid obesity ([place])    Myocardial infarction ([place])    per pt- prior to CABG   Neuropathy    Obstructive sleep apnea on CPAP    Restless legs syndrome (RLS) 09/17/2012   Stroke  (cerebrum) ([place]) 05/16/2017   Left parietal   Urticaria     Past Surgical History:  Procedure Laterality Date   ABDOMINAL HYSTERECTOMY     ARTHROSCOPY KNEE W/ DRILLING  06/2011   and Dece[place] of 2013.   Carpel tunnel  1980's   CATARACT EXTRACTION W/PHACO Right 03/21/2017   Procedure: CATARACT EXTRACTION PHACO AND INTRAOCULAR LENS PLACEMENT RIGHT EYE;  Surgeon: Baruch Goldmann, MD;  Location: [facility];  Service: Ophthalmology;  Laterality: Right;  CDE: 2.91    CATARACT EXTRACTION W/PHACO Left 04/18/2017   Procedure: CATARACT EXTRACTION PHACO AND INTRAOCULAR LENS PLACEMENT LEFT EYE;  Surgeon: Baruch Goldmann, MD;  Location: [facility];  Service: Ophthalmology;  Laterality: Left;  left   CHOLECYSTECTOMY     COLONOSCOPY WITH PROPOFOL N/A 10/04/2016   Procedure: COLONOSCOPY WITH PROPOFOL;  Surgeon: Rogene Houston, MD;  [facility];  Service: Endoscopy;  Laterality: N/A;  11:10   CORONARY ARTERY BYPASS GRAFT N/A 08/29/2017   Procedure: CORONARY ARTERY BYPASS GRAFTING (CABG) x 3 WITH ENDOSCOPIC HARVESTING OF RIGHT SAPHENOUS VEIN;  Surgeon: Ivin Poot, MD;  Location: [facility];  Service: Open Heart Surgery;  Laterality: N/A;   ESOPHAGOGASTRODUODENOSCOPY N/A 04/25/2016   Procedure: ESOPHAGOGASTRODUODENOSCOPY (EGD);  Surgeon: Rogene Houston, MD;  [facility];  Service: Endoscopy;  Laterality: N/A;  730   LEFT HEART CATH AND CORONARY ANGIOGRAPHY N/A 08/27/2017   Procedure: LEFT HEART CATH AND CORONARY ANGIOGRAPHY;  Surgeon: Sherren Mocha, MD;  Location: [facility];  Service: Cardiovascular::  pLAD 95% - p-mLAD 50%, ostD1 90% -pD1 80%; ostOM1 90%; rPDA 80%. EF ~50-55% - HK of dital Anterolateral & Apical wall.  - Rec CVTS c/s   NECK SURGERY     pt reports having growth removed from the back of her neck- after 2021   OPEN REDUCTION INTERNAL FIXATION (ORIF) HAND Left 04/10/2020   Procedure: OPEN REDUCTION INTERNAL FIXATION (ORIF) HAND, left index finger;  Surgeon: Carole Civil, MD;  Location: [facility];  Service: Orthopedics;  Laterality: Left;  0.045 k wires   RECTAL SURGERY     fissure   SHOULDER SURGERY Left    arthroscopy in March of this year   TEE WITHOUT CARDIOVERSION N/A 08/29/2017   Procedure: TRANSESOPHAGEAL ECHOCARDIOGRAM (TEE);  Surgeon: Prescott Gum, Collier Salina, MD;  Location: [facility];  Service: Open Heart Surgery;  Laterality: N/A;   TRANSFORAMINAL LUMBAR INTERBODY FUSION W/ MIS 1 LEVEL Right 07/12/2021   Procedure: Minimally Invasive Surgery Transforaminal Lumbar Interbody Fusion  Lumbar four-five;  Surgeon: Vallarie Mare, MD;  Location: [place];  Service: Neurosurgery;  Laterality: Right;   TRANSTHORACIC ECHOCARDIOGRAM  06/06/2017  Mild to moderate reduced EF 40 and 45%.  Anterior septal, inferoseptal and basal to mid inferior hypokinesis.  GR 1 DD.  No significant valvular lesion    Family Psychiatric History: see below  Family History:  Family History  Problem Relation Age of Onset   Hypertension Mother    Lymphoma Mother    Depression Mother    Arthritis Father    Alcohol abuse Father    Hypertension Sister    Cancer Brother        kidney and lung   Alcohol abuse Brother    Alcohol abuse Paternal Uncle    Alcohol abuse Paternal Grandfather    Alcohol abuse Paternal Grandmother    Allergic rhinitis Neg Hx    Angioedema Neg Hx    Asthma Neg Hx    Atopy Neg Hx    Eczema Neg Hx    Immunodeficiency Neg Hx    Urticaria Neg Hx     Social History:  Social History   Socioeconomic History   Marital status: Single    Spouse name: Not on file   Number of children: 0   Years of education: 16   Highest education level: Not on file  Occupational History    Employer: [employer]  Tobacco Use   Smoking status: Former    Types: Cigarettes    Quit date: 02/04/1971    Years since quitting: 50.6   Smokeless tobacco: Never   Tobacco comments:    smoked 2 cigarettes a day  Vaping Use   Vaping Use: Never used  Substance and  Sexual Activity   Alcohol use: No    Alcohol/week: 0.0 standard drinks   Drug use: No   Sexual activity: Never  Other Topics Concern   Not on file  Social History Narrative   Patient lives at home alone.    Patient has no children.    Patient has her masters in nursing.    Patient is single.    Patient drinks about 2 glasses of tea daily.   Patient is right handed.   Social Determinants of Health   Financial Resource Strain: Not on file  Food Insecurity: Not on file  Transportation Needs: Not on file  Physical Activity: Not on file  Stress: Not on file  Social Connections: Not on file    Allergies:  Allergies  Allergen Reactions   Amoxicillin Anaphylaxis and Other (See Comments)    Has patient had a PCN reaction causing immediate rash, facial/tongue/throat swelling, SOB or lightheadedness with hypotension: Yes Has patient had a PCN reaction causing severe rash involving mucus membranes or skin necrosis: Yes Has patient had a PCN reaction that required hospitalization: Yes Has patient had a PCN reaction occurring within the last 10 years: Yes If all of the above answers are "NO", then may proceed with Cephalosporin use.    Hydrocodone Anaphylaxis   Percocet [Oxycodone-Acetaminophen] Other (See Comments)    Causes chest pain /tightness. Pressure pain around her ribs.   Vancomycin Itching   Depacon [Valproate Sodium]    Depacon [Valproic Acid] Other (See Comments)    Causes falls     Metabolic Disorder Labs: Lab Results  Component Value Date   HGBA1C 7.4 (H) 07/05/2021   MPG 165.68 07/05/2021   MPG 148 03/05/2019   No results found for: PROLACTIN Lab Results  Component Value Date   CHOL 129 11/08/2019   TRIG 158 11/08/2019   HDL 60 11/08/2019   CHOLHDL 3.8 03/05/2019  VLDL 31 (H) 07/10/2016   LDLCALC 43 11/08/2019   LDLCALC 152 (H) 03/05/2019   Lab Results  Component Value Date   TSH 1.49 03/05/2019   TSH 2.671 10/30/2018    Therapeutic Level  Labs: No results found for: LITHIUM No results found for: VALPROATE No components found for:  CBMZ  Current Medications: Current Outpatient Medications  Medication Sig Dispense Refill   ALPRAZolam (XANAX) 0.25 MG tablet Take 1 tablet (0.25 mg total) by mouth at bedtime as needed for anxiety or sleep. 30 tablet 2   aspirin EC 81 MG tablet Take 1 tablet (81 mg total) by mouth daily. 90 tablet 3   buPROPion (WELLBUTRIN XL) 150 MG 24 hr tablet Take 1 tablet (150 mg total) by mouth every morning. 30 tablet 2   carvedilol (COREG) 6.25 MG tablet TAKE ONE TABLET BY MOUTH TWICE DAILY (MORNING,EVENING) (Patient taking differently: Take 6.25 mg by mouth 2 (two) times daily with a meal.) 180 tablet 2   cetirizine (ZYRTEC) 10 MG tablet Take 1 tablet tablet 1-2 times daily. (Patient taking differently: Take 10 mg by mouth at bedtime.) 60 tablet 5   diclofenac Sodium (VOLTAREN) 1 % GEL Apply 2 g topically 4 (four) times daily as needed (pain).     diphenhydrAMINE (BENADRYL) 25 mg capsule Take 25 mg by mouth as needed for allergies or itching.     empagliflozin (JARDIANCE) 10 MG TABS tablet Take 10 mg by mouth daily.     EPINEPHrine 0.3 mg/0.3 mL IJ SOAJ injection Inject 0.3 mg into the muscle once.     famotidine (PEPCID) 20 MG tablet Take 20 mg by mouth at bedtime.     ferrous sulfate 325 (65 FE) MG tablet Take 325 mg by mouth daily after supper.     gabapentin (NEURONTIN) 400 MG capsule One capsule in the morning and 2 in the evening (Patient taking differently: Take 400-800 mg by mouth See admin instructions. One capsule ( 400 mg) in the morning and 2  capsules ( 800 mg) in the evening) 270 capsule 3   glipiZIDE (GLUCOTROL XL) 5 MG 24 hr tablet TAKE ONE TABLET BY MOUTH DAILY WITH BREAKFAST (Patient taking differently: Take 5 mg by mouth 2 (two) times daily.) 90 tablet 0   lamoTRIgine (LAMICTAL) 150 MG tablet Take 1 tablet (150 mg total) by mouth at bedtime. 30 tablet 2   levothyroxine (SYNTHROID) 112 MCG  tablet Take 112 mcg by mouth daily before breakfast.     losartan (COZAAR) 25 MG tablet TAKE ONE TABLET BY MOUTH DAILY (MORNING) (Patient taking differently: Take 50 mg by mouth daily.) 90 tablet 2   meclizine (ANTIVERT) 25 MG tablet Take 1 tablet (25 mg total) by mouth 3 (three) times daily as needed for dizziness. (Patient taking differently: Take 25 mg by mouth daily as needed for dizziness.) 30 tablet 0   metFORMIN (GLUCOPHAGE) 500 MG tablet TAKE ONE TABLET BY MOUTH TWICE DAILY WITH A MEAL (MORNING ,EVENING) (Patient taking differently: Take 500 mg by mouth 2 (two) times daily with a meal.) 180 tablet 0   methocarbamol (ROBAXIN) 500 MG tablet Take 1 tablet (500 mg total) by mouth every 6 (six) hours as needed for muscle spasms. 90 tablet 3   nystatin cream (MYCOSTATIN) Apply topically 2 (two) times daily. 30 g 0   ondansetron (ZOFRAN) 4 MG tablet Take 1 tablet (4 mg total) by mouth every 8 (eight) hours as needed for nausea or vomiting. 20 tablet 0   OVER THE  COUNTER MEDICATION Take 1 tablet by mouth daily as needed (pain). CBD Gummies     pantoprazole (PROTONIX) 40 MG tablet Take 1 tablet (40 mg total) by mouth daily before supper. 90 tablet 3   potassium chloride SA (KLOR-CON M) 20 MEQ tablet Take 20 mEq by mouth daily as needed (cramps).     REPATHA SURECLICK 034 MG/ML SOAJ INJECT ONE DOSE INTO SKIN EVERY 14 DAYS. (Patient taking differently: Inject 140 mg into the skin every 14 (fourteen) days.) 2 mL 11   rOPINIRole (REQUIP) 3 MG tablet TAKE ONE TABLET BY MOUTH EVERY MORNING, ,NOON AND AT BEDTIME. (PRT PT MORNING,EVENING,BEDTIME) (Patient taking differently: Take 3 mg by mouth 3 (three) times daily.) 270 tablet 3   tiZANidine (ZANAFLEX) 2 MG tablet Take 2 mg by mouth every 8 (eight) hours as needed for muscle spasms.     torsemide (DEMADEX) 20 MG tablet Take 20 mg by mouth daily as needed (edema).     traMADol (ULTRAM) 50 MG tablet Take 100 mg by mouth daily as needed for moderate pain.      traMADol (ULTRAM) 50 MG tablet Take 2 tablets (100 mg total) by mouth every 6 (six) hours as needed for moderate pain or severe pain. 30 tablet 0   venlafaxine XR (EFFEXOR-XR) 150 MG 24 hr capsule Take 2 capsules (300 mg total) by mouth at bedtime. 60 capsule 2   Vitamin D, Ergocalciferol, (DRISDOL) 1.25 MG (50000 UNIT) CAPS capsule Take 50,000 Units by mouth every 7 (seven) days. Sunday     No current facility-administered medications for this visit.     Musculoskeletal: Strength & Muscle Tone: na Gait & Station: na Patient leans: na  Psychiatric Specialty Exam: Review of Systems  Musculoskeletal:  Positive for back pain.  All other systems reviewed and are negative.  There were no vitals taken for this visit.There is no height or weight on file to calculate BMI.  General Appearance: NA  Eye Contact:  NA  Speech:  Clear and Coherent  Volume:  Normal  Mood:  Irritable  Affect:  NA  Thought Process:  Goal Directed  Orientation:  Full (Time, Place, and Person)  Thought Content: WDL   Suicidal Thoughts:  No  Homicidal Thoughts:  No  Memory:  Immediate;   Good Recent;   Good Remote;   Fair  Judgement:  Fair  Insight:  Fair  Psychomotor Activity:  Decreased  Concentration:  Concentration: Good and Attention Span: Good  Recall:  Good  Fund of Knowledge: Good  Language: Good  Akathisia:  No  Handed:  Right  AIMS (if indicated): not done  Assets:  Communication Skills Desire for Improvement Resilience Social Support Talents/Skills  ADL's:  Intact  Cognition: WNL  Sleep:  Fair   Screenings: Mini-Mental    [place] Office Visit from 02/18/2018 in [facility] Office Visit from 09/17/2017 in [facility]  Total Score (max 30 points ) 28 28      PHQ2-9    Flowsheet Row Video Visit from 08/30/2021 in [facility] Video Visit from 05/22/2021 in [facility] Video Visit from 02/15/2021 in [facility] Video Visit from 11/16/2020 in [facility] Video Visit from 08/03/2020 in [facility]  PHQ-2 Total Score 0 0 0 0 0      Flowsheet Row Video Visit from 08/30/2021 in [facility] (Discharged) from 07/12/2021 in [facility]  [facility] Pre-Admission Testing 60 from 07/05/2021 in [facility] PREADMISSION TESTING  C-SSRS RISK CATEGORY No Risk No Risk No Risk        Assessment and Plan: This patient is a 71 year old female with a history of depression anxiety mood swings and insomnia.  Despite this recent stressor of being "scammed" she is doing well on her current regimen.  She will continue Wellbutrin XL 150 mg daily as well as Effexor XR 300 mg daily for depression, Lamictal 100 mg daily for mood stabilization and occasional Xanax 0.25 mg at bedtime for anxiety.  She will return to see me in 3 months  Collaboration of Care: Collaboration of Care: Primary Care Provider AEB notes will be shared with PCP at patient's request  Patient/Guardian was advised Release of Information must be obtained prior to any record release in order to collaborate their care with an outside provider. Patient/Guardian was advised if they have not already done so to contact the registration department to sign all necessary forms in order for [place] to release information regarding their care.   Consent: Patient/Guardian gives verbal consent for treatment and assignment of benefits for services provided during this visit. Patient/Guardian expressed understanding and agreed to proceed.    [name], MD 08/30/2021, 11:09 AM

## 2021-09-01 DIAGNOSIS — I130 Hypertensive heart and chronic kidney disease with heart failure and stage 1 through stage 4 chronic kidney disease, or unspecified chronic kidney disease: Secondary | ICD-10-CM | POA: Diagnosis not present

## 2021-09-01 DIAGNOSIS — E039 Hypothyroidism, unspecified: Secondary | ICD-10-CM | POA: Diagnosis not present

## 2021-09-01 DIAGNOSIS — N1832 Chronic kidney disease, stage 3b: Secondary | ICD-10-CM | POA: Diagnosis not present

## 2021-09-01 DIAGNOSIS — R413 Other amnesia: Secondary | ICD-10-CM | POA: Diagnosis not present

## 2021-09-01 DIAGNOSIS — E1143 Type 2 diabetes mellitus with diabetic autonomic (poly)neuropathy: Secondary | ICD-10-CM | POA: Diagnosis not present

## 2021-09-01 DIAGNOSIS — E782 Mixed hyperlipidemia: Secondary | ICD-10-CM | POA: Diagnosis not present

## 2021-09-01 DIAGNOSIS — Z4789 Encounter for other orthopedic aftercare: Secondary | ICD-10-CM | POA: Diagnosis not present

## 2021-09-01 DIAGNOSIS — G8929 Other chronic pain: Secondary | ICD-10-CM | POA: Diagnosis not present

## 2021-09-01 DIAGNOSIS — G4733 Obstructive sleep apnea (adult) (pediatric): Secondary | ICD-10-CM | POA: Diagnosis not present

## 2021-09-01 DIAGNOSIS — G2581 Restless legs syndrome: Secondary | ICD-10-CM | POA: Diagnosis not present

## 2021-09-01 DIAGNOSIS — F319 Bipolar disorder, unspecified: Secondary | ICD-10-CM | POA: Diagnosis not present

## 2021-09-01 DIAGNOSIS — I25110 Atherosclerotic heart disease of native coronary artery with unstable angina pectoris: Secondary | ICD-10-CM | POA: Diagnosis not present

## 2021-09-01 DIAGNOSIS — M4316 Spondylolisthesis, lumbar region: Secondary | ICD-10-CM | POA: Diagnosis not present

## 2021-09-01 DIAGNOSIS — E1122 Type 2 diabetes mellitus with diabetic chronic kidney disease: Secondary | ICD-10-CM | POA: Diagnosis not present

## 2021-09-01 DIAGNOSIS — E662 Morbid (severe) obesity with alveolar hypoventilation: Secondary | ICD-10-CM | POA: Diagnosis not present

## 2021-09-01 DIAGNOSIS — D509 Iron deficiency anemia, unspecified: Secondary | ICD-10-CM | POA: Diagnosis not present

## 2021-09-01 DIAGNOSIS — F411 Generalized anxiety disorder: Secondary | ICD-10-CM | POA: Diagnosis not present

## 2021-09-01 DIAGNOSIS — E1142 Type 2 diabetes mellitus with diabetic polyneuropathy: Secondary | ICD-10-CM | POA: Diagnosis not present

## 2021-09-01 DIAGNOSIS — E1169 Type 2 diabetes mellitus with other specified complication: Secondary | ICD-10-CM | POA: Diagnosis not present

## 2021-09-01 DIAGNOSIS — I5042 Chronic combined systolic (congestive) and diastolic (congestive) heart failure: Secondary | ICD-10-CM | POA: Diagnosis not present

## 2021-09-01 DIAGNOSIS — R1310 Dysphagia, unspecified: Secondary | ICD-10-CM | POA: Diagnosis not present

## 2021-09-01 DIAGNOSIS — M48061 Spinal stenosis, lumbar region without neurogenic claudication: Secondary | ICD-10-CM | POA: Diagnosis not present

## 2021-09-01 DIAGNOSIS — K219 Gastro-esophageal reflux disease without esophagitis: Secondary | ICD-10-CM | POA: Diagnosis not present

## 2021-09-01 DIAGNOSIS — M4726 Other spondylosis with radiculopathy, lumbar region: Secondary | ICD-10-CM | POA: Diagnosis not present

## 2021-09-01 DIAGNOSIS — M160 Bilateral primary osteoarthritis of hip: Secondary | ICD-10-CM | POA: Diagnosis not present

## 2021-09-03 ENCOUNTER — Ambulatory Visit: Payer: Medicare Other | Admitting: Neurology

## 2021-09-03 DIAGNOSIS — M4726 Other spondylosis with radiculopathy, lumbar region: Secondary | ICD-10-CM | POA: Diagnosis not present

## 2021-09-03 DIAGNOSIS — M4316 Spondylolisthesis, lumbar region: Secondary | ICD-10-CM | POA: Diagnosis not present

## 2021-09-03 DIAGNOSIS — G8929 Other chronic pain: Secondary | ICD-10-CM | POA: Diagnosis not present

## 2021-09-03 DIAGNOSIS — M48061 Spinal stenosis, lumbar region without neurogenic claudication: Secondary | ICD-10-CM | POA: Diagnosis not present

## 2021-09-03 DIAGNOSIS — Z4789 Encounter for other orthopedic aftercare: Secondary | ICD-10-CM | POA: Diagnosis not present

## 2021-09-03 DIAGNOSIS — I130 Hypertensive heart and chronic kidney disease with heart failure and stage 1 through stage 4 chronic kidney disease, or unspecified chronic kidney disease: Secondary | ICD-10-CM | POA: Diagnosis not present

## 2021-09-04 DIAGNOSIS — Z4789 Encounter for other orthopedic aftercare: Secondary | ICD-10-CM | POA: Diagnosis not present

## 2021-09-04 DIAGNOSIS — M4316 Spondylolisthesis, lumbar region: Secondary | ICD-10-CM | POA: Diagnosis not present

## 2021-09-04 DIAGNOSIS — G8929 Other chronic pain: Secondary | ICD-10-CM | POA: Diagnosis not present

## 2021-09-04 DIAGNOSIS — I130 Hypertensive heart and chronic kidney disease with heart failure and stage 1 through stage 4 chronic kidney disease, or unspecified chronic kidney disease: Secondary | ICD-10-CM | POA: Diagnosis not present

## 2021-09-04 DIAGNOSIS — L84 Corns and callosities: Secondary | ICD-10-CM | POA: Diagnosis not present

## 2021-09-04 DIAGNOSIS — M79676 Pain in unspecified toe(s): Secondary | ICD-10-CM | POA: Diagnosis not present

## 2021-09-04 DIAGNOSIS — E1142 Type 2 diabetes mellitus with diabetic polyneuropathy: Secondary | ICD-10-CM | POA: Diagnosis not present

## 2021-09-04 DIAGNOSIS — B351 Tinea unguium: Secondary | ICD-10-CM | POA: Diagnosis not present

## 2021-09-04 DIAGNOSIS — M48061 Spinal stenosis, lumbar region without neurogenic claudication: Secondary | ICD-10-CM | POA: Diagnosis not present

## 2021-09-04 DIAGNOSIS — M4726 Other spondylosis with radiculopathy, lumbar region: Secondary | ICD-10-CM | POA: Diagnosis not present

## 2021-09-07 DIAGNOSIS — I130 Hypertensive heart and chronic kidney disease with heart failure and stage 1 through stage 4 chronic kidney disease, or unspecified chronic kidney disease: Secondary | ICD-10-CM | POA: Diagnosis not present

## 2021-09-07 DIAGNOSIS — M48061 Spinal stenosis, lumbar region without neurogenic claudication: Secondary | ICD-10-CM | POA: Diagnosis not present

## 2021-09-07 DIAGNOSIS — M4726 Other spondylosis with radiculopathy, lumbar region: Secondary | ICD-10-CM | POA: Diagnosis not present

## 2021-09-07 DIAGNOSIS — Z4789 Encounter for other orthopedic aftercare: Secondary | ICD-10-CM | POA: Diagnosis not present

## 2021-09-07 DIAGNOSIS — M4316 Spondylolisthesis, lumbar region: Secondary | ICD-10-CM | POA: Diagnosis not present

## 2021-09-07 DIAGNOSIS — G8929 Other chronic pain: Secondary | ICD-10-CM | POA: Diagnosis not present

## 2021-09-12 DIAGNOSIS — Z6841 Body Mass Index (BMI) 40.0 and over, adult: Secondary | ICD-10-CM | POA: Diagnosis not present

## 2021-09-12 DIAGNOSIS — I10 Essential (primary) hypertension: Secondary | ICD-10-CM | POA: Diagnosis not present

## 2021-09-12 DIAGNOSIS — M5416 Radiculopathy, lumbar region: Secondary | ICD-10-CM | POA: Diagnosis not present

## 2021-09-12 DIAGNOSIS — E1142 Type 2 diabetes mellitus with diabetic polyneuropathy: Secondary | ICD-10-CM | POA: Diagnosis not present

## 2021-09-12 DIAGNOSIS — M48062 Spinal stenosis, lumbar region with neurogenic claudication: Secondary | ICD-10-CM | POA: Diagnosis not present

## 2021-09-12 DIAGNOSIS — E119 Type 2 diabetes mellitus without complications: Secondary | ICD-10-CM | POA: Diagnosis not present

## 2021-09-13 DIAGNOSIS — M4726 Other spondylosis with radiculopathy, lumbar region: Secondary | ICD-10-CM | POA: Diagnosis not present

## 2021-09-13 DIAGNOSIS — I130 Hypertensive heart and chronic kidney disease with heart failure and stage 1 through stage 4 chronic kidney disease, or unspecified chronic kidney disease: Secondary | ICD-10-CM | POA: Diagnosis not present

## 2021-09-13 DIAGNOSIS — G8929 Other chronic pain: Secondary | ICD-10-CM | POA: Diagnosis not present

## 2021-09-13 DIAGNOSIS — M48061 Spinal stenosis, lumbar region without neurogenic claudication: Secondary | ICD-10-CM | POA: Diagnosis not present

## 2021-09-13 DIAGNOSIS — Z4789 Encounter for other orthopedic aftercare: Secondary | ICD-10-CM | POA: Diagnosis not present

## 2021-09-13 DIAGNOSIS — M4316 Spondylolisthesis, lumbar region: Secondary | ICD-10-CM | POA: Diagnosis not present

## 2021-09-17 DIAGNOSIS — E782 Mixed hyperlipidemia: Secondary | ICD-10-CM | POA: Diagnosis not present

## 2021-09-17 DIAGNOSIS — E1121 Type 2 diabetes mellitus with diabetic nephropathy: Secondary | ICD-10-CM | POA: Diagnosis not present

## 2021-09-17 DIAGNOSIS — Z8673 Personal history of transient ischemic attack (TIA), and cerebral infarction without residual deficits: Secondary | ICD-10-CM | POA: Diagnosis not present

## 2021-09-17 DIAGNOSIS — E039 Hypothyroidism, unspecified: Secondary | ICD-10-CM | POA: Diagnosis not present

## 2021-09-19 DIAGNOSIS — G8929 Other chronic pain: Secondary | ICD-10-CM | POA: Diagnosis not present

## 2021-09-19 DIAGNOSIS — M4726 Other spondylosis with radiculopathy, lumbar region: Secondary | ICD-10-CM | POA: Diagnosis not present

## 2021-09-19 DIAGNOSIS — Z4789 Encounter for other orthopedic aftercare: Secondary | ICD-10-CM | POA: Diagnosis not present

## 2021-09-19 DIAGNOSIS — M4316 Spondylolisthesis, lumbar region: Secondary | ICD-10-CM | POA: Diagnosis not present

## 2021-09-19 DIAGNOSIS — M48061 Spinal stenosis, lumbar region without neurogenic claudication: Secondary | ICD-10-CM | POA: Diagnosis not present

## 2021-09-19 DIAGNOSIS — I130 Hypertensive heart and chronic kidney disease with heart failure and stage 1 through stage 4 chronic kidney disease, or unspecified chronic kidney disease: Secondary | ICD-10-CM | POA: Diagnosis not present

## 2021-09-25 DIAGNOSIS — E1140 Type 2 diabetes mellitus with diabetic neuropathy, unspecified: Secondary | ICD-10-CM | POA: Diagnosis not present

## 2021-09-25 DIAGNOSIS — E1121 Type 2 diabetes mellitus with diabetic nephropathy: Secondary | ICD-10-CM | POA: Diagnosis not present

## 2021-09-25 DIAGNOSIS — D509 Iron deficiency anemia, unspecified: Secondary | ICD-10-CM | POA: Diagnosis not present

## 2021-09-25 DIAGNOSIS — G4733 Obstructive sleep apnea (adult) (pediatric): Secondary | ICD-10-CM | POA: Diagnosis not present

## 2021-09-25 DIAGNOSIS — M545 Low back pain, unspecified: Secondary | ICD-10-CM | POA: Diagnosis not present

## 2021-09-25 DIAGNOSIS — I5032 Chronic diastolic (congestive) heart failure: Secondary | ICD-10-CM | POA: Diagnosis not present

## 2021-09-25 DIAGNOSIS — E782 Mixed hyperlipidemia: Secondary | ICD-10-CM | POA: Diagnosis not present

## 2021-09-25 DIAGNOSIS — Z8673 Personal history of transient ischemic attack (TIA), and cerebral infarction without residual deficits: Secondary | ICD-10-CM | POA: Diagnosis not present

## 2021-09-25 DIAGNOSIS — I2510 Atherosclerotic heart disease of native coronary artery without angina pectoris: Secondary | ICD-10-CM | POA: Diagnosis not present

## 2021-09-25 DIAGNOSIS — E039 Hypothyroidism, unspecified: Secondary | ICD-10-CM | POA: Diagnosis not present

## 2021-09-25 DIAGNOSIS — N1832 Chronic kidney disease, stage 3b: Secondary | ICD-10-CM | POA: Diagnosis not present

## 2021-09-25 DIAGNOSIS — F3181 Bipolar II disorder: Secondary | ICD-10-CM | POA: Diagnosis not present

## 2021-09-27 NOTE — Patient Outreach (Signed)
Received a referral from Case Management for Ms. Odette ---. I have assigned Joellyn Quails, RN to call for follow up and determine if there are any Case Management needs.    [facility], [place], [place] Management [phone]

## 2021-09-28 DIAGNOSIS — M48061 Spinal stenosis, lumbar region without neurogenic claudication: Secondary | ICD-10-CM | POA: Diagnosis not present

## 2021-09-28 DIAGNOSIS — I130 Hypertensive heart and chronic kidney disease with heart failure and stage 1 through stage 4 chronic kidney disease, or unspecified chronic kidney disease: Secondary | ICD-10-CM | POA: Diagnosis not present

## 2021-09-28 DIAGNOSIS — M4726 Other spondylosis with radiculopathy, lumbar region: Secondary | ICD-10-CM | POA: Diagnosis not present

## 2021-09-28 DIAGNOSIS — Z4789 Encounter for other orthopedic aftercare: Secondary | ICD-10-CM | POA: Diagnosis not present

## 2021-09-28 DIAGNOSIS — G8929 Other chronic pain: Secondary | ICD-10-CM | POA: Diagnosis not present

## 2021-09-28 DIAGNOSIS — M4316 Spondylolisthesis, lumbar region: Secondary | ICD-10-CM | POA: Diagnosis not present

## 2021-10-01 DIAGNOSIS — E662 Morbid (severe) obesity with alveolar hypoventilation: Secondary | ICD-10-CM | POA: Diagnosis not present

## 2021-10-01 DIAGNOSIS — G8929 Other chronic pain: Secondary | ICD-10-CM | POA: Diagnosis not present

## 2021-10-01 DIAGNOSIS — M4316 Spondylolisthesis, lumbar region: Secondary | ICD-10-CM | POA: Diagnosis not present

## 2021-10-01 DIAGNOSIS — N1832 Chronic kidney disease, stage 3b: Secondary | ICD-10-CM | POA: Diagnosis not present

## 2021-10-01 DIAGNOSIS — E1122 Type 2 diabetes mellitus with diabetic chronic kidney disease: Secondary | ICD-10-CM | POA: Diagnosis not present

## 2021-10-01 DIAGNOSIS — M160 Bilateral primary osteoarthritis of hip: Secondary | ICD-10-CM | POA: Diagnosis not present

## 2021-10-01 DIAGNOSIS — E1169 Type 2 diabetes mellitus with other specified complication: Secondary | ICD-10-CM | POA: Diagnosis not present

## 2021-10-01 DIAGNOSIS — R1310 Dysphagia, unspecified: Secondary | ICD-10-CM | POA: Diagnosis not present

## 2021-10-01 DIAGNOSIS — E782 Mixed hyperlipidemia: Secondary | ICD-10-CM | POA: Diagnosis not present

## 2021-10-01 DIAGNOSIS — K219 Gastro-esophageal reflux disease without esophagitis: Secondary | ICD-10-CM | POA: Diagnosis not present

## 2021-10-01 DIAGNOSIS — M48061 Spinal stenosis, lumbar region without neurogenic claudication: Secondary | ICD-10-CM | POA: Diagnosis not present

## 2021-10-01 DIAGNOSIS — M4726 Other spondylosis with radiculopathy, lumbar region: Secondary | ICD-10-CM | POA: Diagnosis not present

## 2021-10-01 DIAGNOSIS — G2581 Restless legs syndrome: Secondary | ICD-10-CM | POA: Diagnosis not present

## 2021-10-01 DIAGNOSIS — E039 Hypothyroidism, unspecified: Secondary | ICD-10-CM | POA: Diagnosis not present

## 2021-10-01 DIAGNOSIS — G4733 Obstructive sleep apnea (adult) (pediatric): Secondary | ICD-10-CM | POA: Diagnosis not present

## 2021-10-01 DIAGNOSIS — D471 Chronic myeloproliferative disease: Secondary | ICD-10-CM | POA: Diagnosis not present

## 2021-10-01 DIAGNOSIS — I25110 Atherosclerotic heart disease of native coronary artery with unstable angina pectoris: Secondary | ICD-10-CM | POA: Diagnosis not present

## 2021-10-01 DIAGNOSIS — I5042 Chronic combined systolic (congestive) and diastolic (congestive) heart failure: Secondary | ICD-10-CM | POA: Diagnosis not present

## 2021-10-01 DIAGNOSIS — R413 Other amnesia: Secondary | ICD-10-CM | POA: Diagnosis not present

## 2021-10-01 DIAGNOSIS — E1142 Type 2 diabetes mellitus with diabetic polyneuropathy: Secondary | ICD-10-CM | POA: Diagnosis not present

## 2021-10-01 DIAGNOSIS — D509 Iron deficiency anemia, unspecified: Secondary | ICD-10-CM | POA: Diagnosis not present

## 2021-10-01 DIAGNOSIS — E1143 Type 2 diabetes mellitus with diabetic autonomic (poly)neuropathy: Secondary | ICD-10-CM | POA: Diagnosis not present

## 2021-10-01 DIAGNOSIS — I130 Hypertensive heart and chronic kidney disease with heart failure and stage 1 through stage 4 chronic kidney disease, or unspecified chronic kidney disease: Secondary | ICD-10-CM | POA: Diagnosis not present

## 2021-10-01 DIAGNOSIS — F319 Bipolar disorder, unspecified: Secondary | ICD-10-CM | POA: Diagnosis not present

## 2021-10-01 DIAGNOSIS — F411 Generalized anxiety disorder: Secondary | ICD-10-CM | POA: Diagnosis not present

## 2021-10-03 DIAGNOSIS — N1832 Chronic kidney disease, stage 3b: Secondary | ICD-10-CM | POA: Diagnosis not present

## 2021-10-03 DIAGNOSIS — I5042 Chronic combined systolic (congestive) and diastolic (congestive) heart failure: Secondary | ICD-10-CM | POA: Diagnosis not present

## 2021-10-03 DIAGNOSIS — M4726 Other spondylosis with radiculopathy, lumbar region: Secondary | ICD-10-CM | POA: Diagnosis not present

## 2021-10-03 DIAGNOSIS — I25110 Atherosclerotic heart disease of native coronary artery with unstable angina pectoris: Secondary | ICD-10-CM | POA: Diagnosis not present

## 2021-10-03 DIAGNOSIS — G8929 Other chronic pain: Secondary | ICD-10-CM | POA: Diagnosis not present

## 2021-10-03 DIAGNOSIS — I130 Hypertensive heart and chronic kidney disease with heart failure and stage 1 through stage 4 chronic kidney disease, or unspecified chronic kidney disease: Secondary | ICD-10-CM | POA: Diagnosis not present

## 2021-10-03 DIAGNOSIS — Z4789 Encounter for other orthopedic aftercare: Secondary | ICD-10-CM | POA: Diagnosis not present

## 2021-10-03 DIAGNOSIS — E1122 Type 2 diabetes mellitus with diabetic chronic kidney disease: Secondary | ICD-10-CM | POA: Diagnosis not present

## 2021-10-03 DIAGNOSIS — M4316 Spondylolisthesis, lumbar region: Secondary | ICD-10-CM | POA: Diagnosis not present

## 2021-10-03 DIAGNOSIS — M48061 Spinal stenosis, lumbar region without neurogenic claudication: Secondary | ICD-10-CM | POA: Diagnosis not present

## 2021-10-04 ENCOUNTER — Other Ambulatory Visit: Payer: Self-pay | Admitting: *Deleted

## 2021-10-04 ENCOUNTER — Encounter: Payer: Self-pay | Admitting: *Deleted

## 2021-10-04 NOTE — Patient Outreach (Signed)
[facility]) Care Management Telephonic RN Care Manager Note   10/04/2021 Name:  Norma [name] MRN:  [id number] DOB:  23-Feb-1951  Summary: Successful initial outreach & assistance with local Transportation services, suggestions for  [employer] keeping and social security application Norma [name] reports she was on disability for a long period of time and had supplemental security income (SSI) previously  Sister can take her to out of county appointments CPAP not working and not scheduled to be replaced until 2024. She was previously followed by Dr Luan Pulling. She was informed by the CPAP durable medical equipment (DME) Company that she owe a past charge. The CPAP was obtained in 2019. She reports she did have sleep studies completed in 2022 (Dr Dohmeier)Her present CPAP has button controls that are broken from a fall, now can not be set unless it is plugged in a particular way.    Recommendations/Changes made from today's visit: Initial assessment completed Addressed immediate concerns with transportation with a conference call with patient to aleya [phone] at her [employer] supplement coverage Confirmed this is not a medicare advantage plan and only a supplemental plan ( only offers nothing that medicare will not offer)  Discussed her county Aging, Disability, & Transit Services (ADTS) & completed a conference call to agency with her to allow her to hear and ask questions of [name]. She will be able to afford the $2-3 charge and call 3 days prior to local in county appointments by dialing [phone] extension [phone]. She is aware that [facility] charges an average of $240 round trip for out of the county medical transportation  Emailed information on the resources discussed today for ADTS & SSI Discuss SSI application for possible extra coverage  Discussed medicaid Community Alternatives Programs (CAP)  Discussed out of [place]ock[place] house cleaning service Encouraged to check local online, post  office or other community venues for posting      Subjective: Norma [name] is an 71 y.o. year old female who is a primary patient of [name], Edwinna Areola, MD. The care management team was consulted for assistance with care management and/or care coordination needs.    Telephonic RN Care Manager completed Telephone Visit today.   Objective:  Medications Reviewed Today     Reviewed by Cloria Spring, MD (Psychiatrist) on 08/30/21 at 1108  Med List Status: <None>   Medication Order Taking? Sig Documenting Provider Last Dose Status Informant  ALPRAZolam (XANAX) 0.25 MG tablet [id number]  Take 1 tablet (0.25 mg total) by mouth at bedtime as needed for anxiety or sleep. Cloria Spring, MD  Active   aspirin EC 81 MG tablet [id number] No Take 1 tablet (81 mg total) by mouth daily. Arnoldo Lenis, MD 07/05/2021 Active Self  buPROPion (WELLBUTRIN XL) 150 MG 24 hr tablet [id number]  Take 1 tablet (150 mg total) by mouth every morning. Cloria Spring, MD  Active   carvedilol (COREG) 6.25 MG tablet [id number] No TAKE ONE TABLET BY MOUTH TWICE DAILY (MORNING,EVENING)  Patient taking differently: Take 6.25 mg by mouth 2 (two) times daily with a meal.   Arnoldo Lenis, MD 07/12/2021 0400 Active Self  cetirizine (ZYRTEC) 10 MG tablet [id number] No Take 1 tablet tablet 1-2 times daily.  Patient taking differently: Take 10 mg by mouth at bedtime.   Valentina Shaggy, MD Past Week Active Self  diclofenac Sodium (VOLTAREN) 1 % GEL [id number] No Apply 2 g topically 4 (four) times daily as needed (pain).  [provider] Taking Active Self           Med Note Dorina Hoyer, [facility]   Fri Jun 29, 2021  3:52 PM)    diphenhydrAMINE (BENADRYL) 25 mg capsule [id number] No Take 25 mg by mouth as needed for allergies or itching. [provider] Past Week Active Self  empagliflozin (JARDIANCE) 10 MG TABS tablet [id number] No Take 10 mg by mouth daily. [provider] 07/11/2021 Active Self   EPINEPHrine 0.3 mg/0.3 mL IJ SOAJ injection [id number] No Inject 0.3 mg into the muscle once. [provider] Taking Active Self           Med Note Grayland Ormond   Mon May 08, 2020  9:08 AM) Patient uses for ananaphylaxis.  famotidine (PEPCID) 20 MG tablet [id number] No Take 20 mg by mouth at bedtime. [provider] Past Month Active Self  ferrous sulfate 325 (65 FE) MG tablet [id number] No Take 325 mg by mouth daily after supper. [provider] Past Week Active Self  gabapentin (NEURONTIN) 400 MG capsule [id number] No One capsule in the morning and 2 in the evening  Patient taking differently: Take 400-800 mg by mouth See admin instructions. One capsule ( 400 mg) in the morning and 2  capsules ( 800 mg) in the evening   Kathrynn Ducking, MD 07/12/2021 0400 Active Self  glipiZIDE (GLUCOTROL XL) 5 MG 24 hr tablet [id number] No TAKE ONE TABLET BY MOUTH DAILY WITH BREAKFAST  Patient taking differently: Take 5 mg by mouth 2 (two) times daily.   Cassandria Anger, MD 07/11/2021 Active Self  lamoTRIgine (LAMICTAL) 150 MG tablet [id number]  Take 1 tablet (150 mg total) by mouth at bedtime. Cloria Spring, MD  Active   levothyroxine (SYNTHROID) 112 MCG tablet [id number] No Take 112 mcg by mouth daily before breakfast. [provider] 07/12/2021 0400 Active Self  losartan (COZAAR) 25 MG tablet [id number] No TAKE ONE TABLET BY MOUTH DAILY (MORNING)  Patient taking differently: Take 50 mg by mouth daily.   Arnoldo Lenis, MD 07/12/2021 0400 Active Self  meclizine (ANTIVERT) 25 MG tablet [id number] No Take 1 tablet (25 mg total) by mouth 3 (three) times daily as needed for dizziness.  Patient taking differently: Take 25 mg by mouth daily as needed for dizziness.   Virgel Manifold, MD Past Month Active Self  metFORMIN (GLUCOPHAGE) 500 MG tablet [id number] No TAKE ONE TABLET BY MOUTH TWICE DAILY WITH A MEAL (MORNING ,EVENING)  Patient taking differently: Take 500 mg by mouth  2 (two) times daily with a meal.   Cassandria Anger, MD 07/11/2021 Active Self  methocarbamol (ROBAXIN) 500 MG tablet [id number]  Take 1 tablet (500 mg total) by mouth every 6 (six) hours as needed for muscle spasms. Marvis Moeller, NP  Active   nystatin cream (MYCOSTATIN) [id number]  Apply topically 2 (two) times daily. Marvis Moeller, NP  Active   ondansetron (ZOFRAN) 4 MG tablet [id number] No Take 1 tablet (4 mg total) by mouth every 8 (eight) hours as needed for nausea or vomiting. Rogene Houston, MD Past Month Active Self  OVER THE COUNTER MEDICATION [id number] No Take 1 tablet by mouth daily as needed (pain). CBD Gummies [provider] Taking Active Self  pantoprazole (PROTONIX) 40 MG tablet [id number] No Take 1 tablet (40 mg total) by mouth daily before supper. Rogene Houston, MD 07/11/2021 Active Self  potassium chloride SA (KLOR-CON M) 20 MEQ tablet [id number] No Take  20 mEq by mouth daily as needed (cramps). [provider] More than a month Active Self  REPATHA SURECLICK 527 MG/ML SOAJ [id number] No INJECT ONE DOSE INTO SKIN EVERY 14 DAYS.  Patient taking differently: Inject 140 mg into the skin every 14 (fourteen) days.   Pixie Casino, MD Past Month Active Self  rOPINIRole (REQUIP) 3 MG tablet [id number] No TAKE ONE TABLET BY MOUTH EVERY MORNING, ,NOON AND AT BEDTIME. (PRT PT MORNING,EVENING,BEDTIME)  Patient taking differently: Take 3 mg by mouth 3 (three) times daily.   Kathrynn Ducking, MD 07/12/2021 0400 Active Self  tiZANidine (ZANAFLEX) 2 MG tablet [id number] No Take 2 mg by mouth every 8 (eight) hours as needed for muscle spasms. [provider] 07/12/2021 0400 Active Self  torsemide (DEMADEX) 20 MG tablet [id number] No Take 20 mg by mouth daily as needed (edema). [provider] Past Week Active Self  traMADol (ULTRAM) 50 MG tablet [id number] No Take 100 mg by mouth daily as needed for moderate pain. [provider] Taking  Active Self           Med Note Sherren Mocha, Ronnette Juniper   Tue Oct 20, 2018 11:40 AM) Patient reports that she seldom takes.  traMADol (ULTRAM) 50 MG tablet [id number]  Take 2 tablets (100 mg total) by mouth every 6 (six) hours as needed for moderate pain or severe pain. Marvis Moeller, NP  Active   venlafaxine XR (EFFEXOR-XR) 150 MG 24 hr capsule [id number]  Take 2 capsules (300 mg total) by mouth at bedtime. Cloria Spring, MD  Active   Vitamin D, Ergocalciferol, (DRISDOL) 1.25 MG (50000 UNIT) CAPS capsule [id number] No Take 50,000 Units by mouth every 7 (seven) days. Sunday [provider] 07/12/2021 0400 Active Self           Patient Active Problem List   Diagnosis Date Noted   Dysphagia 05/22/2021   Leukocytosis 04/20/2021   Hypokalemia 04/20/2021   Spinal stenosis of lumbar region 02/12/2021   Stage 3b chronic kidney disease ([place]) 11/29/2020   History of diabetes mellitus 11/29/2020   Chronic diastolic heart failure ([place]) 11/29/2020   Idiopathic urticaria 11/29/2020   IDA (iron deficiency anemia) 42/68/3419   Systolic heart failure ([place]) 11/14/2020   Disorder of kidney 11/14/2020   Myeloproliferative disorder ([place]) 05/22/2020   Thrombocytosis 05/22/2020   Body mass index (BMI) 45.0-49.9, adult ([place]) 05/17/2020   S/P ORIF (open reduction internal fixation) fracture left index finger 04/10/20 04/26/2020   Open displaced fracture of proximal phalanx of left index finger    Cellulitis of left hand    Dog bite, hand, left, initial encounter 04/09/2020   Dog bite 04/09/2020   Hypoxemia 09/23/2019   Sleep related hypoxia 09/23/2019   Idiopathic hypersomnia with long sleep time 08/16/2019   Insomnia due to other mental disorder 08/16/2019   Dry mouth not due to sicca syndrome 08/16/2019   Chronic coughing 08/16/2019   Unilateral primary osteoarthritis, right knee 02/18/2019   Bilateral primary osteoarthritis of knee 01/20/2019   Type 2 diabetes mellitus with diabetic neuropathy,  unspecified ([place]) 09/16/2018   Unilateral primary osteoarthritis, left knee 09/16/2018   Class 3 severe obesity due to excess calories with serious comorbidity and body mass index (BMI) of 45.0 to 49.9 in adult ([place]) 02/20/2018   Severe obesity ([place]) 02/20/2018   Mixed hyperlipidemia 09/11/2017   Pressure injury of skin 09/02/2017   Coronary artery disease involving native heart without angina pectoris    S/P CABG x  3    Acute blood loss anemia    Bipolar affective disorder (HCC)    History of CVA (cerebrovascular accident)    Hx of CABG 08/29/2017   Coronary artery disease involving native coronary artery of native heart with unstable angina pectoris ([place]) 08/28/2017   Unstable angina ([place]) 08/27/2017   Cardiomyopathy ([place]) 08/22/2017   Cerebrovascular accident ([place]) 05/16/2017   Anemia 09/19/2016   Dizziness 06/21/2016   Chest pain with moderate risk for cardiac etiology 04/23/2016   Bipolar I disorder, most recent episode depressed ([place]) 06/05/2015   DM type 2 causing vascular disease ([place]) 09/20/2014   Chronic bilateral low back pain with bilateral sciatica 09/20/2014   Morbid obesity, unspecified obesity type ([place]) 09/20/2014   Memory difficulty 09/20/2014   Type 2 diabetes mellitus ([place]) 09/20/2014   Chronic low back pain 09/20/2014   Lumbosacral spondylosis without myelopathy 12/25/2012   Gastroparesis 11/04/2012   Restless legs syndrome (RLS) 09/17/2012   Headache(784.0) 03/19/2012   Gastroesophageal reflux disease 02/16/2012   Hypothyroidism 02/16/2012   Tremor 02/16/2012   Hyperlipidemia associated with type 2 diabetes mellitus ([place]) 04/01/2008   Obstructive sleep apnea syndrome 04/01/2008   Benign essential hypertension 04/01/2008   Left bundle-branch block 04/01/2008     SDOH:  (Social Determinants of Health) assessments and interventions performed:  SDOH Interventions    Flowsheet Row Most Recent Value  SDOH Interventions   Food Insecurity Interventions  Intervention Not Indicated  Financial Strain Interventions Intervention Not Indicated  Housing Interventions Intervention Not Indicated  Stress Interventions Intervention Not Indicated  Transportation Interventions Intervention Not Indicated       Care Plan  Review of patient past medical history, allergies, medications, health status, including review of consultants reports, laboratory and other test data, was performed as part of comprehensive evaluation for care management services.     Plan: The patient has been provided with contact information for the care management team and has been advised to call with any health related questions or concerns.  The care management team will reach out to the patient again over the next 30+ business days.  Shahla Betsill L. Lavina Hamman, RN, BSN, [place] Coordinator Office number [phone] [facility] number [phone] Fax number [phone]

## 2021-10-05 ENCOUNTER — Telehealth: Payer: Self-pay

## 2021-10-05 DIAGNOSIS — G4733 Obstructive sleep apnea (adult) (pediatric): Secondary | ICD-10-CM

## 2021-10-08 DIAGNOSIS — M4726 Other spondylosis with radiculopathy, lumbar region: Secondary | ICD-10-CM | POA: Diagnosis not present

## 2021-10-08 DIAGNOSIS — M4316 Spondylolisthesis, lumbar region: Secondary | ICD-10-CM | POA: Diagnosis not present

## 2021-10-08 DIAGNOSIS — I5042 Chronic combined systolic (congestive) and diastolic (congestive) heart failure: Secondary | ICD-10-CM | POA: Diagnosis not present

## 2021-10-08 DIAGNOSIS — M48061 Spinal stenosis, lumbar region without neurogenic claudication: Secondary | ICD-10-CM | POA: Diagnosis not present

## 2021-10-08 DIAGNOSIS — I130 Hypertensive heart and chronic kidney disease with heart failure and stage 1 through stage 4 chronic kidney disease, or unspecified chronic kidney disease: Secondary | ICD-10-CM | POA: Diagnosis not present

## 2021-10-08 DIAGNOSIS — G8929 Other chronic pain: Secondary | ICD-10-CM | POA: Diagnosis not present

## 2021-10-10 DIAGNOSIS — M4316 Spondylolisthesis, lumbar region: Secondary | ICD-10-CM | POA: Diagnosis not present

## 2021-10-10 DIAGNOSIS — I5042 Chronic combined systolic (congestive) and diastolic (congestive) heart failure: Secondary | ICD-10-CM | POA: Diagnosis not present

## 2021-10-10 DIAGNOSIS — M48061 Spinal stenosis, lumbar region without neurogenic claudication: Secondary | ICD-10-CM | POA: Diagnosis not present

## 2021-10-10 DIAGNOSIS — G8929 Other chronic pain: Secondary | ICD-10-CM | POA: Diagnosis not present

## 2021-10-10 DIAGNOSIS — I130 Hypertensive heart and chronic kidney disease with heart failure and stage 1 through stage 4 chronic kidney disease, or unspecified chronic kidney disease: Secondary | ICD-10-CM | POA: Diagnosis not present

## 2021-10-10 DIAGNOSIS — M4726 Other spondylosis with radiculopathy, lumbar region: Secondary | ICD-10-CM | POA: Diagnosis not present

## 2021-10-10 NOTE — Telephone Encounter (Signed)
New, Daylene Posey, Winifred Olive, CMA; Reola Calkins [facility],   I received the order. However I need the following to process this request:   1. Pap settings listed on the order  2. face 2 face note showing usage and benefit ( I have one but its almost a year old and I don't know that it will make it to processing in time.   Based on some notes I seen, it looks like patient has a year or go to go before this will be covered. After verification I will be able to provide some details.   Thank you,   Margaretha Sheffield order to add in Cpap settings from last ov note.  Asked if ov note from Feb. Of this year would be sufficient.

## 2021-10-10 NOTE — Addendum Note (Signed)
Addended by: Fritzi Mandes D on: 10/10/2021 09:01 AM   Modules accepted: Orders

## 2021-10-11 DIAGNOSIS — I130 Hypertensive heart and chronic kidney disease with heart failure and stage 1 through stage 4 chronic kidney disease, or unspecified chronic kidney disease: Secondary | ICD-10-CM | POA: Diagnosis not present

## 2021-10-11 DIAGNOSIS — G8929 Other chronic pain: Secondary | ICD-10-CM | POA: Diagnosis not present

## 2021-10-11 DIAGNOSIS — M4316 Spondylolisthesis, lumbar region: Secondary | ICD-10-CM | POA: Diagnosis not present

## 2021-10-11 DIAGNOSIS — M4726 Other spondylosis with radiculopathy, lumbar region: Secondary | ICD-10-CM | POA: Diagnosis not present

## 2021-10-11 DIAGNOSIS — M48061 Spinal stenosis, lumbar region without neurogenic claudication: Secondary | ICD-10-CM | POA: Diagnosis not present

## 2021-10-11 DIAGNOSIS — I5042 Chronic combined systolic (congestive) and diastolic (congestive) heart failure: Secondary | ICD-10-CM | POA: Diagnosis not present

## 2021-10-11 NOTE — Telephone Encounter (Signed)
Norma [name]; Fritzi Mandes D, CMA; Leonie Green, Gaylyn Rong, [place],   I received a reply back from out intake team regarding patients cpap:   The above patient is not eligible for a new machine. She received a machine 04/23/2018 from [facility] thru Medicare. She is not eligible until 04/24/2023.     Also, this patient is on our Do not call list per her request so we are unable to communicate this info to her.  If she would like private pay pricing she can call us at [phone] and ask for retail sales.   Thank you,   [facility] and informed patient of response from [employer]. Patient was not happy to hear this and states she is frustrated that she has to wait that long for a replacement. I voiced understanding of her concerns and suggested to her that she may could call [employer] and see if they could maintenance her machine if she did not want to pay out of pocket for one. She voiced understanding and states she may try to buy one on her own out of pocket without insurance involvement.  Advised her to call our office back if she needs help with the situation or a new order. Patient voiced understanding and nothing further is needed at this time.

## 2021-10-12 DIAGNOSIS — I10 Essential (primary) hypertension: Secondary | ICD-10-CM | POA: Diagnosis not present

## 2021-10-12 DIAGNOSIS — E119 Type 2 diabetes mellitus without complications: Secondary | ICD-10-CM | POA: Diagnosis not present

## 2021-10-17 DIAGNOSIS — I130 Hypertensive heart and chronic kidney disease with heart failure and stage 1 through stage 4 chronic kidney disease, or unspecified chronic kidney disease: Secondary | ICD-10-CM | POA: Diagnosis not present

## 2021-10-17 DIAGNOSIS — M4726 Other spondylosis with radiculopathy, lumbar region: Secondary | ICD-10-CM | POA: Diagnosis not present

## 2021-10-17 DIAGNOSIS — I5042 Chronic combined systolic (congestive) and diastolic (congestive) heart failure: Secondary | ICD-10-CM | POA: Diagnosis not present

## 2021-10-17 DIAGNOSIS — M4316 Spondylolisthesis, lumbar region: Secondary | ICD-10-CM | POA: Diagnosis not present

## 2021-10-17 DIAGNOSIS — G8929 Other chronic pain: Secondary | ICD-10-CM | POA: Diagnosis not present

## 2021-10-17 DIAGNOSIS — M48061 Spinal stenosis, lumbar region without neurogenic claudication: Secondary | ICD-10-CM | POA: Diagnosis not present

## 2021-10-19 NOTE — Telephone Encounter (Signed)
Update: Had suggested [facility] to patient on 10/05/21 to check into for a cpap machine since they are a non profit,   Called patient today to ensure she had received a CPAP machine and to see if she needed any further help from [place] and she states she is going to pick up her cpap today from the [employer]. Nothing further needed at this time. Advised patient to bring in her SD card from machine  for appts with Dr. Elsworth Soho.

## 2021-10-22 ENCOUNTER — Encounter (INDEPENDENT_AMBULATORY_CARE_PROVIDER_SITE_OTHER): Payer: Self-pay | Admitting: Internal Medicine

## 2021-10-29 ENCOUNTER — Ambulatory Visit (INDEPENDENT_AMBULATORY_CARE_PROVIDER_SITE_OTHER): Payer: Medicare Other

## 2021-10-29 ENCOUNTER — Ambulatory Visit: Payer: Medicare Other

## 2021-10-29 ENCOUNTER — Ambulatory Visit (INDEPENDENT_AMBULATORY_CARE_PROVIDER_SITE_OTHER): Payer: Medicare Other | Admitting: Orthopedic Surgery

## 2021-10-29 ENCOUNTER — Encounter: Payer: Self-pay | Admitting: Orthopedic Surgery

## 2021-10-29 VITALS — BP 118/49 | HR 67 | Ht 60.0 in | Wt 229.0 lb

## 2021-10-29 DIAGNOSIS — M79641 Pain in right hand: Secondary | ICD-10-CM

## 2021-10-29 DIAGNOSIS — M79642 Pain in left hand: Secondary | ICD-10-CM

## 2021-10-29 DIAGNOSIS — M25511 Pain in right shoulder: Secondary | ICD-10-CM | POA: Diagnosis not present

## 2021-10-29 DIAGNOSIS — M7542 Impingement syndrome of left shoulder: Secondary | ICD-10-CM | POA: Diagnosis not present

## 2021-10-29 DIAGNOSIS — I2510 Atherosclerotic heart disease of native coronary artery without angina pectoris: Secondary | ICD-10-CM | POA: Diagnosis not present

## 2021-10-29 MED ORDER — METHYLPREDNISOLONE ACETATE 40 MG/ML IJ SUSP
40.0000 mg | Freq: Once | INTRAMUSCULAR | Status: AC
Start: 2021-10-29 — End: 2021-10-29
  Administered 2021-10-29: 40 mg via INTRA_ARTICULAR

## 2021-10-29 NOTE — Addendum Note (Signed)
Addended byCandice Camp on: 10/29/2021 04:29 PM   Modules accepted: Orders

## 2021-10-29 NOTE — Progress Notes (Signed)
Chief Complaint  Patient presents with   Hand Pain    Bilateral hands painful states has had flare of arthritis all over all of joints are painful     Norma [name] says that her whole body has arthritis.  However further history reveals that she has pain when she lifts her left arm none on the right and she has bilateral hand pain she has some bone spurs forming around the IP joints of the hand she has stiffness in the morning it resolves or gets better has a better way to put it over the course of the day  Her exam shows nodular areas of the IP joints of all the digits she has excellent range of motion of the right hand good range of motion of the left hand except for the index finger which had surgery and has flexion contracture  Color capillary refill looks good there is no swelling or warmth of the joints  Differential diagnosis osteoarthritis of the hands, carpal tunnel syndrome, left shoulder appears to have impingement syndrome  She has some mild arthritis in the DIP joints but nothing that I would really make a big deal over.  She cannot take anti-inflammatories because of the effect it has on her GI tract so we are very limited here to topical liniments, Voltaren gel, Tylenol  I did mention to her perhaps to see a rheumatologist for perhaps some disease modifying drugs which are probably off label use in his case but may be warranted since we can use anti-inflammatories.  There is no orthopedic surgery needed at this time   Encounter Diagnoses  Name Primary?   Bilateral hand pain Yes   Impingement syndrome of left shoulder      Left shoulder injection subacromial space  Continue Voltaren gel and liniment such as Bengay or capsaicin as needed  Procedure injection right shoulder subacromial joint and left shoulder subacromial joint   procedure note the subacromial injection shoulder RIGHT  Verbal consent was obtained to inject the  RIGHT   Shoulder  Timeout was completed to  confirm the injection site is a subacromial space of the  RIGHT  shoulder   Medication used Depo-Medrol 40 mg and lidocaine 1% 3 cc  Anesthesia was provided by ethyl chloride  The injection was performed in the RIGHT  posterior subacromial space. After pinning the skin with alcohol and anesthetized the skin with ethyl chloride the subacromial space was injected using a 20-gauge needle. There were no complications  Sterile dressing was applied.    Procedure note the subacromial injection shoulder left   Verbal consent was obtained to inject the  Left   Shoulder  Timeout was completed to confirm the injection site is a subacromial space of the  left  shoulder  Medication used Depo-Medrol 40 mg and lidocaine 1% 3 cc  Anesthesia was provided by ethyl chloride  The injection was performed in the left  posterior subacromial space. After pinning the skin with alcohol and anesthetized the skin with ethyl chloride the subacromial space was injected using a 20-gauge needle. There were no complications  Sterile dressing was applied.

## 2021-10-29 NOTE — Patient Instructions (Addendum)
Continue Voltaren gel and liniment such as Bengay or capsaicin as needed  Consider referral to rheumatology

## 2021-10-30 ENCOUNTER — Encounter (INDEPENDENT_AMBULATORY_CARE_PROVIDER_SITE_OTHER): Payer: Self-pay

## 2021-10-31 ENCOUNTER — Other Ambulatory Visit: Payer: Self-pay | Admitting: Neurosurgery

## 2021-10-31 ENCOUNTER — Other Ambulatory Visit: Payer: Self-pay | Admitting: *Deleted

## 2021-10-31 ENCOUNTER — Other Ambulatory Visit (HOSPITAL_COMMUNITY): Payer: Self-pay | Admitting: Neurosurgery

## 2021-10-31 DIAGNOSIS — M5416 Radiculopathy, lumbar region: Secondary | ICD-10-CM | POA: Diagnosis not present

## 2021-10-31 DIAGNOSIS — E1142 Type 2 diabetes mellitus with diabetic polyneuropathy: Secondary | ICD-10-CM | POA: Diagnosis not present

## 2021-10-31 DIAGNOSIS — M48062 Spinal stenosis, lumbar region with neurogenic claudication: Secondary | ICD-10-CM | POA: Diagnosis not present

## 2021-10-31 NOTE — Patient Outreach (Signed)
Care Coordination   Unsuccessful follow up  Visit Note   10/31/2021 Name: Norma [name] MRN: [id number] DOB: Jan 13, 1951  Norma [name] is a 71 y.o. year old female who sees [name], Edwinna Areola, MD for primary care.   [facility] Unsuccessful outreach Outreach attempt to the listed at the preferred outreach number in EPIC [phone] No answer. [facility] RN CM left HIPAA [facility] Portability and Accountability Act) compliant voicemail message along with CM's contact info.   What matters to the patients health and wellness today?  N/A with initial call today With return call to patient today was able to successfully reach her. She states she is doing well and denies needs. She reports her transportation needs have resolved with offered resources She has seen her orthopedic for her hand pain and it has improved Agrees to further follow up and to cal RN CM prn   Goals Addressed   None    Care Plan : [place] of Care  Updates made by Barbaraann Faster, RN since 10/31/2021 12:00 AM  Completed 10/31/2021   Problem: Complex Care Coordination Needs and disease management in patient withback pain, DM, CKD Resolved 10/31/2021  Priority: High  Onset Date: 10/04/2021     Long-Range Goal: Establish Plan of Care for Management Complex SDOH Barriers, disease management and Care Coordination Needs in patient with back pain, ckd, DM Completed 10/31/2021  Start Date: 10/04/2021  This Visit's Progress: On track  Recent Progress: On track  Priority: High  Note:   Current Barriers:  Knowledge Deficits related to plan of care for management of DMII, CKD Stage 3 b, and lumbar pain   Care Coordination needs related to Transportation, Limited education about back pain, ckd, DM*, and Lacks knowledge of community resource: [facility]  RN CM Clinical Goal(s):  Patient will verbalize understanding of plan for management of DMII, CKD Stage 3 b, and lumbar pain  as evidenced by verbalization of use of resources  provide, improved pain level & lab values for DM & CKD  through collaboration with Consulting civil engineer, provider, and care team.   Interventions: Outreaches for care coordination, disease management, resources, home care/education needs Inter-disciplinary care team collaboration (see longitudinal plan of care) Evaluation of current treatment plan related to  self management and patient's adherence to plan as established by provider 10/31/21 Care plan resolved Duplicate goals    Interdisciplinary Collaboration Interventions:  (Status: Goal Met.) Short Term Goal   Collaborated with RN CM to initiate plan of care to address needs related to Transportation in patient with  recent back surgery, hx DM, CKD, CHF Collaboration with Celene Squibb, MD  10/04/21 Addressed immediate concerns with transportation with a conference call with patient to [place] [phone] at her [employer] supplement coverage Confirmed this is not a medicare advantage plan and only a supplemental plan ( only offers nothing that medicare will not offer)  Discussed her county Aging, Disability, & Transit Services (ADTS) & completed a conference call to agency with her to allow her to hear and ask questions of [name]. Emailed information on the resources discussed today for ADTS & SSI Discuss SSI application for possible extra coverage  Discussed medicaid Community Alternatives Programs (CAP)  Discussed out of pocking house cleaning service Encouraged to check local online, post office or other community venues for posting   Chronic Kidney Disease Interventions:  (Status:   10/31/21 Care plan resolved Duplicate goals ) Long Term Goal Assessed the Patient  understanding of chronic kidney disease    Evaluation of current treatment plan related to chronic kidney disease self management and patient's adherence to plan as established by provider      Screening for signs and symptoms of depression related to chronic disease state      Assessed  social determinant of health barriers    Last practice recorded BP readings:  BP Readings from Last 3 Encounters:  07/17/21 (!) 108/93  07/05/21 (!) 118/56  06/06/21 118/64  Most recent eGFR/CrCl:  Lab Results  Component Value Date   EGFR 36 (L) 01/11/2021    No components found for: "CRCL"   Diabetes Interventions:  (Status:   10/31/21 Care plan resolved Duplicate goals ) Long Term Goal Assessed patient's understanding of A1c goal: <7% Counseled on importance of regular laboratory monitoring as prescribed Review of patient status, including review of consultants reports, relevant laboratory and other test results, and medications completed Screening for signs and symptoms of depression related to chronic disease state  Assessed social determinant of health barriers Lab Results  Component Value Date   HGBA1C 7.4 (H) 07/05/2021   Pain Interventions:  (Status:   10/31/21 Care plan resolved Duplicate goals ) Short Term Goal Pain assessment performed Medications reviewed Reviewed provider established plan for pain management Discussed importance of adherence to all scheduled medical appointments Counseled on the importance of reporting any/all new or changed pain symptoms or management strategies to pain management provider Screening for signs and symptoms of depression related to chronic disease state  Assessed social determinant of health barriers   Patient Goals/Self-Care Activities: Take all medications as prescribed Attend all scheduled provider appointments Call pharmacy for medication refills 3-7 days in advance of running out of medications Attend church or other social activities Perform all self care activities independently  Perform IADL's (shopping, preparing meals, housekeeping, managing finances) independently Call provider office for new concerns or questions  10/04/21 She will be able to afford the $2-3 charge and call 3 days prior to local in county appointments by  dialing [phone] extension [phone]. She is aware that [facility] charges an average of $240 round trip for out of the county medical transportation   Follow Up Plan:  No further follow up required: 10/31/21 Care plan resolved Duplicate goals          SDOH assessments and interventions completed:   No   Care Coordination Interventions Activated:  No Care Coordination Interventions:   No, not indicated  Follow up plan: Follow up call scheduled for within the next 30 busy days also pending return call from patient   Encounter Outcome:  No answer with initial call out to pt Pt. Visit Completed was able to return call to patient and complete the visit

## 2021-11-07 ENCOUNTER — Ambulatory Visit (INDEPENDENT_AMBULATORY_CARE_PROVIDER_SITE_OTHER): Payer: Medicare Other | Admitting: Cardiology

## 2021-11-07 ENCOUNTER — Encounter: Payer: Self-pay | Admitting: *Deleted

## 2021-11-07 ENCOUNTER — Encounter: Payer: Self-pay | Admitting: Cardiology

## 2021-11-07 VITALS — BP 124/64 | HR 85 | Ht 60.0 in | Wt 225.4 lb

## 2021-11-07 DIAGNOSIS — E782 Mixed hyperlipidemia: Secondary | ICD-10-CM | POA: Diagnosis not present

## 2021-11-07 DIAGNOSIS — I5032 Chronic diastolic (congestive) heart failure: Secondary | ICD-10-CM

## 2021-11-07 DIAGNOSIS — I2510 Atherosclerotic heart disease of native coronary artery without angina pectoris: Secondary | ICD-10-CM | POA: Diagnosis not present

## 2021-11-07 NOTE — Patient Instructions (Signed)
Medication Instructions:  Continue all current medications.   Labwork: none  Testing/Procedures: none  Follow-Up: 6 months   Any Other Special Instructions Will Be Listed Below (If Applicable).   If you need a refill on your cardiac medications before your next appointment, please call your pharmacy.  

## 2021-11-07 NOTE — Progress Notes (Signed)
Clinical Summary Ms. Dace is a 71 y.o.female seen today for follow up of the following medical problems.    1. Chronic diastolic HF 71/2458 echo VLEF 09-98%, grade I diastolic dysfunction.  -06/3823 echo LVEF 50-55%   - mild infrequent edema, no SOB/DOE - compliant with meds. Takes the torsemide prn, has not needed in quite some time. In general trying to limit due to her kidney dysfunction.      2. CAD - 08/2017 cath for unstable angina: LAD 95%, D1 90%, RPDA 80%, OM 90%.  - CT surgery was consulted. 08/30/17 CABG with sequential SVG to diag and LAD, SVG-OM.   -  stopped lopressor due to fatigue.    - episode of chest pain 3 weeks ago. Several days in a row, followed by stomach issues -mid to right chest, sharp. Not positional. Would last <1 minutes. Not exertional.    3. Chronic LBBB     4. Hyperlipidemia -intolerant to statins - followed in lipid clinic.   - she is on repatha, LDL 152-->43 on repatha.    - compliant with meds     5. OSA - on cpap, followed by Dr Elsworth Soho   6. CKD 3b - followed Dr Theador Hawthorne    7.Back surgery 07/12/21    Past Medical History:  Diagnosis Date   Anemia    Anxiety    Bipolar disorder (HCC)    Bulging lumbar disc    L3-4   Chronic daily headache    Chronic low back pain 09/20/2014   CKD (chronic kidney disease)    stage 3   COPD (chronic obstructive pulmonary disease) (HCC)    Coronary artery disease    CABG 2019   Degenerative arthritis    Depression    Diabetes mellitus, type II (HCC)    Diabetic neuropathy ([place]) 09/16/2018   DM type 2 with diabetic peripheral neuropathy ([place]) 09/20/2014   Dyslipidemia    Dyspnea    Family history of adverse reaction to anesthesia    father had reaction to anesthesia medication per patient "they don't use it anymore"- unsure of reaction   Fatty liver    per pt report   Gastroparesis    GERD (gastroesophageal reflux disease)    Heart murmur    History of hiatal hernia    pt states  she no longer has this   Hypothyroidism    IBS (irritable bowel syndrome)    LBBB (left bundle Emanie Behan block)    Memory difficulty 09/20/2014   Morbid obesity ([place])    Myocardial infarction ([place])    per pt- prior to CABG   Neuropathy    Obstructive sleep apnea on CPAP    Restless legs syndrome (RLS) 09/17/2012   Stroke (cerebrum) ([place]) 05/16/2017   Left parietal   Urticaria      Allergies  Allergen Reactions   Amoxicillin Anaphylaxis and Other (See Comments)    Has patient had a PCN reaction causing immediate rash, facial/tongue/throat swelling, SOB or lightheadedness with hypotension: Yes Has patient had a PCN reaction causing severe rash involving mucus membranes or skin necrosis: Yes Has patient had a PCN reaction that required hospitalization: Yes Has patient had a PCN reaction occurring within the last 10 years: Yes If all of the above answers are "NO", then may proceed with Cephalosporin use.    Hydrocodone Anaphylaxis   Percocet [Oxycodone-Acetaminophen] Other (See Comments)    Causes chest pain /tightness. Pressure pain around her ribs.   Vancomycin Itching  Depacon [Valproate Sodium]    Depacon [Valproic Acid] Other (See Comments)    Causes falls      Current Outpatient Medications  Medication Sig Dispense Refill   ALPRAZolam (XANAX) 0.25 MG tablet Take 1 tablet (0.25 mg total) by mouth at bedtime as needed for anxiety or sleep. 30 tablet 2   aspirin EC 81 MG tablet Take 1 tablet (81 mg total) by mouth daily. 90 tablet 3   buPROPion (WELLBUTRIN XL) 150 MG 24 hr tablet Take 1 tablet (150 mg total) by mouth every morning. 30 tablet 2   carvedilol (COREG) 6.25 MG tablet TAKE ONE TABLET BY MOUTH TWICE DAILY (MORNING,EVENING) (Patient taking differently: Take 6.25 mg by mouth 2 (two) times daily with a meal.) 180 tablet 2   cetirizine (ZYRTEC) 10 MG tablet Take 1 tablet tablet 1-2 times daily. (Patient taking differently: Take 10 mg by mouth at bedtime.) 60 tablet 5    diclofenac Sodium (VOLTAREN) 1 % GEL Apply 2 g topically 4 (four) times daily as needed (pain).     diphenhydrAMINE (BENADRYL) 25 mg capsule Take 25 mg by mouth as needed for allergies or itching.     empagliflozin (JARDIANCE) 10 MG TABS tablet Take 10 mg by mouth daily.     EPINEPHrine 0.3 mg/0.3 mL IJ SOAJ injection Inject 0.3 mg into the muscle once.     famotidine (PEPCID) 20 MG tablet Take 20 mg by mouth at bedtime.     ferrous sulfate 325 (65 FE) MG tablet Take 325 mg by mouth daily after supper.     fluticasone (FLONASE) 50 MCG/ACT nasal spray Place 1 spray into both nostrils daily.     gabapentin (NEURONTIN) 400 MG capsule One capsule in the morning and 2 in the evening (Patient not taking: Reported on 10/29/2021) 270 capsule 3   glipiZIDE (GLUCOTROL XL) 5 MG 24 hr tablet TAKE ONE TABLET BY MOUTH DAILY WITH BREAKFAST (Patient taking differently: Take 5 mg by mouth 2 (two) times daily.) 90 tablet 0   lamoTRIgine (LAMICTAL) 150 MG tablet Take 1 tablet (150 mg total) by mouth at bedtime. 30 tablet 2   levothyroxine (SYNTHROID) 112 MCG tablet Take 112 mcg by mouth daily before breakfast.     losartan (COZAAR) 25 MG tablet TAKE ONE TABLET BY MOUTH DAILY (MORNING) (Patient taking differently: Take 50 mg by mouth daily.) 90 tablet 2   meclizine (ANTIVERT) 25 MG tablet Take 1 tablet (25 mg total) by mouth 3 (three) times daily as needed for dizziness. (Patient taking differently: Take 25 mg by mouth daily as needed for dizziness.) 30 tablet 0   metFORMIN (GLUCOPHAGE) 500 MG tablet TAKE ONE TABLET BY MOUTH TWICE DAILY WITH A MEAL (MORNING ,EVENING) (Patient taking differently: Take 500 mg by mouth 2 (two) times daily with a meal.) 180 tablet 0   methocarbamol (ROBAXIN) 500 MG tablet Take 1 tablet (500 mg total) by mouth every 6 (six) hours as needed for muscle spasms. 90 tablet 3   nystatin cream (MYCOSTATIN) Apply topically 2 (two) times daily. 30 g 0   ondansetron (ZOFRAN) 4 MG tablet Take 1 tablet  (4 mg total) by mouth every 8 (eight) hours as needed for nausea or vomiting. 20 tablet 0   OVER THE COUNTER MEDICATION Take 1 tablet by mouth daily as needed (pain). CBD Gummies     pantoprazole (PROTONIX) 40 MG tablet Take 1 tablet (40 mg total) by mouth daily before supper. 90 tablet 3   patiromer (VELTASSA) 8.4 g  packet Take by mouth.     potassium chloride SA (KLOR-CON M) 20 MEQ tablet Take 20 mEq by mouth daily as needed (cramps).     predniSONE (DELTASONE) 5 MG tablet Take by mouth as directed. (Patient not taking: Reported on 10/29/2021)     REPATHA SURECLICK 967 MG/ML SOAJ INJECT ONE DOSE INTO SKIN EVERY 14 DAYS. (Patient taking differently: Inject 140 mg into the skin every 14 (fourteen) days.) 2 mL 11   rOPINIRole (REQUIP) 3 MG tablet TAKE ONE TABLET BY MOUTH EVERY MORNING, ,NOON AND AT BEDTIME. (PRT PT MORNING,EVENING,BEDTIME) (Patient taking differently: Take 3 mg by mouth 3 (three) times daily.) 270 tablet 3   tiZANidine (ZANAFLEX) 2 MG tablet Take 2 mg by mouth every 8 (eight) hours as needed for muscle spasms.     torsemide (DEMADEX) 20 MG tablet Take 20 mg by mouth daily as needed (edema). (Patient not taking: Reported on 10/29/2021)     traMADol (ULTRAM) 50 MG tablet Take 100 mg by mouth daily as needed for moderate pain.     traMADol (ULTRAM) 50 MG tablet Take 2 tablets (100 mg total) by mouth every 6 (six) hours as needed for moderate pain or severe pain. 30 tablet 0   traMADol (ULTRAM) 50 MG tablet Take by mouth.     venlafaxine XR (EFFEXOR-XR) 150 MG 24 hr capsule Take 2 capsules (300 mg total) by mouth at bedtime. 60 capsule 2   Vitamin D, Ergocalciferol, (DRISDOL) 1.25 MG (50000 UNIT) CAPS capsule Take 50,000 Units by mouth every 7 (seven) days. Sunday     No current facility-administered medications for this visit.     Past Surgical History:  Procedure Laterality Date   ABDOMINAL HYSTERECTOMY     ARTHROSCOPY KNEE W/ DRILLING  06/2011   and Decemer of 2013.   Carpel  tunnel  1980's   CATARACT EXTRACTION W/PHACO Right 03/21/2017   Procedure: CATARACT EXTRACTION PHACO AND INTRAOCULAR LENS PLACEMENT RIGHT EYE;  Surgeon: Baruch Goldmann, MD;  Location: [facility];  Service: Ophthalmology;  Laterality: Right;  CDE: 2.91    CATARACT EXTRACTION W/PHACO Left 04/18/2017   Procedure: CATARACT EXTRACTION PHACO AND INTRAOCULAR LENS PLACEMENT LEFT EYE;  Surgeon: Baruch Goldmann, MD;  Location: [facility];  Service: Ophthalmology;  Laterality: Left;  left   CHOLECYSTECTOMY     COLONOSCOPY WITH PROPOFOL N/A 10/04/2016   Procedure: COLONOSCOPY WITH PROPOFOL;  Surgeon: Rogene Houston, MD;  Location: [facility];  Service: Endoscopy;  Laterality: N/A;  11:10   CORONARY ARTERY BYPASS GRAFT N/A 08/29/2017   Procedure: CORONARY ARTERY BYPASS GRAFTING (CABG) x 3 WITH ENDOSCOPIC HARVESTING OF RIGHT SAPHENOUS VEIN;  Surgeon: Ivin Poot, MD;  Location: [facility];  Service: Open Heart Surgery;  Laterality: N/A;   ESOPHAGOGASTRODUODENOSCOPY N/A 04/25/2016   Procedure: ESOPHAGOGASTRODUODENOSCOPY (EGD);  Surgeon: Rogene Houston, MD;  [facility];  Service: Endoscopy;  Laterality: N/A;  730   LEFT HEART CATH AND CORONARY ANGIOGRAPHY N/A 08/27/2017   Procedure: LEFT HEART CATH AND CORONARY ANGIOGRAPHY;  Surgeon: Sherren Mocha, MD;  Location: [facility];  Service: Cardiovascular::  pLAD 95% - p-mLAD 50%, ostD1 90% -pD1 80%; ostOM1 90%; rPDA 80%. EF ~50-55% - HK of dital Anterolateral & Apical wall.  - Rec CVTS c/s   NECK SURGERY     pt reports having growth removed from the back of her neck- after 2021   OPEN REDUCTION INTERNAL FIXATION (ORIF) HAND Left 04/10/2020   Procedure: OPEN REDUCTION INTERNAL FIXATION (  ORIF) HAND, left index finger;  Surgeon: Carole Civil, MD;  Location: [facility];  Service: Orthopedics;  Laterality: Left;  0.045 k wires   RECTAL SURGERY     fissure   SHOULDER SURGERY Left    arthroscopy in March of this year   TEE WITHOUT CARDIOVERSION N/A  08/29/2017   Procedure: TRANSESOPHAGEAL ECHOCARDIOGRAM (TEE);  Surgeon: Prescott Gum, Collier Salina, MD;  Location: [facility];  Service: Open Heart Surgery;  Laterality: N/A;   TRANSFORAMINAL LUMBAR INTERBODY FUSION W/ MIS 1 LEVEL Right 07/12/2021   Procedure: Minimally Invasive Surgery Transforaminal Lumbar Interbody Fusion  Lumbar four-five;  Surgeon: Vallarie Mare, MD;  Location: [facility];  Service: Neurosurgery;  Laterality: Right;   TRANSTHORACIC ECHOCARDIOGRAM  06/06/2017   Mild to moderate reduced EF 40 and 45%.  Anterior septal, inferoseptal and basal to mid inferior hypokinesis.  GR 1 DD.  No significant valvular lesion     Allergies  Allergen Reactions   Amoxicillin Anaphylaxis and Other (See Comments)    Has patient had a PCN reaction causing immediate rash, facial/tongue/throat swelling, SOB or lightheadedness with hypotension: Yes Has patient had a PCN reaction causing severe rash involving mucus membranes or skin necrosis: Yes Has patient had a PCN reaction that required hospitalization: Yes Has patient had a PCN reaction occurring within the last 10 years: Yes If all of the above answers are "NO", then may proceed with Cephalosporin use.    Hydrocodone Anaphylaxis   Percocet [Oxycodone-Acetaminophen] Other (See Comments)    Causes chest pain /tightness. Pressure pain around her ribs.   Vancomycin Itching   Depacon [Valproate Sodium]    Depacon [Valproic Acid] Other (See Comments)    Causes falls       Family History  Problem Relation Age of Onset   Hypertension Mother    Lymphoma Mother    Depression Mother    Arthritis Father    Alcohol abuse Father    Hypertension Sister    Cancer Brother        kidney and lung   Alcohol abuse Brother    Alcohol abuse Paternal Uncle    Alcohol abuse Paternal Grandfather    Alcohol abuse Paternal Grandmother    Allergic rhinitis Neg Hx    Angioedema Neg Hx    Asthma Neg Hx    Atopy Neg Hx    Eczema Neg Hx    Immunodeficiency Neg Hx     Urticaria Neg Hx      Social History Ms. Turay reports that she quit smoking about 50 years ago. Her smoking use included cigarettes. She has never used smokeless tobacco. Ms. Miklos reports no history of alcohol use.   Review of Systems CONSTITUTIONAL: No weight loss, fever, chills, weakness or fatigue.  HEENT: Eyes: No visual loss, blurred vision, double vision or yellow sclerae.No hearing loss, sneezing, congestion, runny nose or sore throat.  SKIN: No rash or itching.  CARDIOVASCULAR: per hpi RESPIRATORY: No shortness of breath, cough or sputum.  GASTROINTESTINAL: No anorexia, nausea, vomiting or diarrhea. No abdominal pain or blood.  GENITOURINARY: No burning on urination, no polyuria NEUROLOGICAL: No headache, dizziness, syncope, paralysis, ataxia, numbness or tingling in the extremities. No change in bowel or bladder control.  MUSCULOSKELETAL: No muscle, back pain, joint pain or stiffness.  LYMPHATICS: No enlarged nodes. No history of splenectomy.  PSYCHIATRIC: No history of depression or anxiety.  ENDOCRINOLOGIC: No reports of sweating, cold or heat intolerance. No polyuria or polydipsia.  [name]   Physical Examination  Today's Vitals   11/07/21 1304  BP: 124/64  Pulse: 85  SpO2: 96%  Weight: 225 lb 6.4 oz (102.2 kg)  Height: 5' (1.524 m)   Body mass index is 44.02 kg/m.  Gen: resting comfortably, no acute distress HEENT: no scleral icterus, pupils equal round and reactive, no palptable cervical adenopathy,  CV: RRR, no m/r/g, no jvd Resp: Clear to auscultation bilaterally GI: abdomen is soft, non-tender, non-distended, normal bowel sounds, no hepatosplenomegaly MSK: extremities are warm, no edema.  Skin: warm, no rash Neuro:  no focal deficits Psych: appropriate affect   Diagnostic Studies  03/2016 Lexiscan Probable normal perfusion and mild soft tissue attenuation (breast) No significant ischemia or evidence for scar The left ventricular ejection fraction is  normal (55-65%). Low risk scan.   08/2017 cath RPDA lesion is 80% stenosed. Ost 1st Mrg to 1st Mrg lesion is 90% stenosed. Prox LAD lesion is 95% stenosed. Prox LAD to Mid LAD lesion is 50% stenosed. Ost 1st Diag lesion is 90% stenosed. 1st Diag lesion is 80% stenosed. The left ventricular ejection fraction is 50-55% by visual estimate. There is mild left ventricular systolic dysfunction.   1.  Severe multivessel coronary artery disease with severe stenosis of the proximal LAD/first diagonal bifurcation, first obtuse marginal Aiyana Stegmann of the circumflex, and PDA Datrell Dunton of the RCA. 2.  Mild segmental LV systolic dysfunction with hypokinesis of the distal anterolateral and apical walls, LVEF estimated at 50 to 55%.   08/2017 TEE Aortic valve: The valve is trileaflet. No stenosis. No regurgitation. Mitral valve: Mild regurgitation. Central jet. Right ventricle: Normal cavity size, wall thickness and ejection fraction. Pericardium: Trivial pericardial effusion. Tricuspid valve: No regurgitation Pulmonic valve: No regurgitation by color doppler. Left ventricle: Regional wall motion abnormalities present, dyskinetic basal and apical septal wall, hypokinesis of lateral wall and inferoseptal wall. Increased wall thickness. LVEF 35-40%. No ASD/PFO present Unable to rule out thrombus in LAA but velocities make thrombus less likely.   05/2017 echo Study Conclusions   - Left ventricle: The cavity size was normal. There was mild   concentric hypertrophy. Systolic function was mildly to   moderately reduced. The estimated ejection fraction was in the   range of 40% to 45%. Hypokinesis of the anteroseptal,   inferoseptal and basal to mid inferior myocardium. Doppler   parameters are consistent with abnormal left ventricular   relaxation (grade 1 diastolic dysfunction). Doppler parameters   are consistent with indeterminate ventricular filling pressure. - Aortic valve: Transvalvular velocity was  within the normal range.   There was no stenosis. There was no regurgitation. - Mitral valve: Transvalvular velocity was within the normal range.   There was no evidence for stenosis. There was no regurgitation. - Right ventricle: The cavity size was normal. Wall thickness was   normal. Systolic function was normal. - Tricuspid valve: There was trivial regurgitation. - Global longitudinal strain -11.9%.     01/2018 echo Study Conclusions   - Left ventricle: The cavity size was normal. Wall thickness was   increased in a pattern of mild LVH. Systolic function was normal.   The estimated ejection fraction was in the range of 50% to 55%.   Regional wall motion abnormalities cannot be excluded. Doppler   parameters are consistent with abnormal left ventricular   relaxation (grade 1 diastolic dysfunction). - Ventricular septum: Septal motion showed abnormal function and   dyssynergy. These changes are consistent with a left bundle   Aadhya Bustamante block. - Mitral valve: Mildly thickened leaflets .  10/2020 echo IMPRESSIONS     1. Markedly abnormal septal motion ? from BBB. Left ventricular ejection  fraction, by estimation, is 50 to 55%. The left ventricle has low normal  function. The left ventricle has no regional wall motion abnormalities.  Left ventricular diastolic  parameters were normal.   2. Right ventricular systolic function is normal. The right ventricular  size is normal.   3. The mitral valve is normal in structure. Trivial mitral valve  regurgitation. No evidence of mitral stenosis.   4. The aortic valve is tricuspid. There is mild calcification of the  aortic valve. Aortic valve regurgitation is not visualized. Mild aortic  valve sclerosis is present, with no evidence of aortic valve stenosis.   5. The inferior vena cava is normal in size with greater than 50%  respiratory variability, suggesting right atrial pressure of 3 mmHg.      Assessment and Plan  1. Chronic  diastolic HF - euvolemic, has not required her prn toresmide in quite some time - continue to monitor   2. CAD - recent atypical chest pain for just a few days, cotninue to monitor - continue current meds   3. Hyperlipidemia - has done very well on repatha,LDL has been at goal - request most recent labs from pcp  F/u 6 months        Arnoldo Lenis, M.D.

## 2021-11-12 ENCOUNTER — Telehealth: Payer: Self-pay | Admitting: Pulmonary Disease

## 2021-11-12 ENCOUNTER — Telehealth: Payer: Self-pay | Admitting: Neurology

## 2021-11-12 ENCOUNTER — Encounter (INDEPENDENT_AMBULATORY_CARE_PROVIDER_SITE_OTHER): Payer: Self-pay | Admitting: Gastroenterology

## 2021-11-12 ENCOUNTER — Other Ambulatory Visit (INDEPENDENT_AMBULATORY_CARE_PROVIDER_SITE_OTHER): Payer: Self-pay

## 2021-11-12 ENCOUNTER — Ambulatory Visit (INDEPENDENT_AMBULATORY_CARE_PROVIDER_SITE_OTHER): Payer: Medicare Other | Admitting: Gastroenterology

## 2021-11-12 ENCOUNTER — Encounter (INDEPENDENT_AMBULATORY_CARE_PROVIDER_SITE_OTHER): Payer: Self-pay

## 2021-11-12 VITALS — BP 123/78 | HR 87 | Temp 98.3°F | Ht 60.0 in | Wt 226.7 lb

## 2021-11-12 DIAGNOSIS — E1122 Type 2 diabetes mellitus with diabetic chronic kidney disease: Secondary | ICD-10-CM | POA: Diagnosis not present

## 2021-11-12 DIAGNOSIS — D638 Anemia in other chronic diseases classified elsewhere: Secondary | ICD-10-CM | POA: Diagnosis not present

## 2021-11-12 DIAGNOSIS — R1319 Other dysphagia: Secondary | ICD-10-CM | POA: Diagnosis not present

## 2021-11-12 DIAGNOSIS — R110 Nausea: Secondary | ICD-10-CM

## 2021-11-12 DIAGNOSIS — R1013 Epigastric pain: Secondary | ICD-10-CM | POA: Diagnosis not present

## 2021-11-12 DIAGNOSIS — E119 Type 2 diabetes mellitus without complications: Secondary | ICD-10-CM | POA: Diagnosis not present

## 2021-11-12 DIAGNOSIS — N189 Chronic kidney disease, unspecified: Secondary | ICD-10-CM | POA: Diagnosis not present

## 2021-11-12 DIAGNOSIS — I5042 Chronic combined systolic (congestive) and diastolic (congestive) heart failure: Secondary | ICD-10-CM | POA: Diagnosis not present

## 2021-11-12 DIAGNOSIS — R809 Proteinuria, unspecified: Secondary | ICD-10-CM | POA: Diagnosis not present

## 2021-11-12 DIAGNOSIS — I129 Hypertensive chronic kidney disease with stage 1 through stage 4 chronic kidney disease, or unspecified chronic kidney disease: Secondary | ICD-10-CM | POA: Diagnosis not present

## 2021-11-12 DIAGNOSIS — E1129 Type 2 diabetes mellitus with other diabetic kidney complication: Secondary | ICD-10-CM | POA: Diagnosis not present

## 2021-11-12 DIAGNOSIS — I10 Essential (primary) hypertension: Secondary | ICD-10-CM | POA: Diagnosis not present

## 2021-11-12 NOTE — Patient Instructions (Signed)
Schedule EGD  Can take Zofran as needed for nausea If negative testing, we will consider CT abdomen/pelvis and gastric emptying study

## 2021-11-12 NOTE — Telephone Encounter (Signed)
Pt was donated a CPAP  machine.  Pt asking for setting to set the machine. Would like a call from the nurse

## 2021-11-12 NOTE — Progress Notes (Unsigned)
Maylon Peppers, M.D. Gastroenterology & Hepatology [facility] [address] [place], [place] [address]  Primary Care Physician: Celene Squibb, MD [employer] [id number]  I will communicate my assessment and recommendations to the referring MD via EMR.  Problems: Recurrent nausea Bloating  History of Present Illness: Norma [name] is a 71 y.o. female with past medical history of bipolar disorder, iron deficiency anemia, CKD, COPD, coronary artery disease status post CABG, depression, diabetes, diabetic neuropathy, hyperlipidemia, fatty liver, gastroparesis, GERD, hypothyroidism, IBS, morbid obesity, stroke, restless leg syndrome, who presents for evaluation of nausea and bloating.  The patient was last seen on 05/22/2021. At that time, the patient was advised to continue pantoprazole.  Patient was continued on oral iron for chronic iron deficiency anemia.  She was scheduled for a esophagram given history of dysphagia.  Esophagram performed on 06/26/2021 showed age-related esophageal dysmotility without penetration or abnormalities consistent with dysphagia.  Most recent hemoglobin on 07/13/2021 was 10.1 with MCV of 93.  Most recent iron stores from 2021 showed a ferritin of 10 and a saturation of 16%.  States that she has presented new onset of nausea for the last month without vomiting. She feels nauseated every day, usually in the morning -episodes are self limited but come back next day. She takes Zofran which she feels helps with her symptoms. She also reports that since she developed her symptoms, she has presented more burping and flatulence. She feels constantly bloated. She has been eating mostly ice cream , pudding, eggs, sausage and potatoes as although type of food may worsen her symptoms. If she eats other type of food she may have abdominal pain.  Patient reports she had back surgery (transforaminal lumbar fusion)  in end of March. States she had to go to rehab and was presenting constipation. States this resolved but believes this may have triggered her new symptoms.  Still has some mild dysphagia, especially to pills.  The patient denies having any nausea, vomiting, fever, chills, hematochezia, melena, hematemesis, abdominal distention, abdominal pain, diarrhea, jaundice, pruritus. Believes she has lost 13-14 lkb since February.  Last EGD: 2018 - Normal esophagus. - Z-line irregular, 35 cm from the incisors. - 2 cm hiatal hernia. - Portal hypertensive gastropathy. Biopsied - pathology showed mild chronic gastritis but negative for H. pylori - Erythematous mucosa in the prepyloric region of the stomach. - Normal duodenal bulb and second portion of the duodenum.  Last Colonoscopy:2018 - normal  Past Medical History: Past Medical History:  Diagnosis Date   Anemia    Anxiety    Bipolar disorder (HCC)    Bulging lumbar disc    L3-4   Chronic daily headache    Chronic low back pain 09/20/2014   CKD (chronic kidney disease)    stage 3   COPD (chronic obstructive pulmonary disease) (HCC)    Coronary artery disease    CABG 2019   Degenerative arthritis    Depression    Diabetes mellitus, type II ([place])    Diabetic neuropathy ([place]) 09/16/2018   DM type 2 with diabetic peripheral neuropathy ([place]) 09/20/2014   Dyslipidemia    Dyspnea    Family history of adverse reaction to anesthesia    father had reaction to anesthesia medication per patient "they don't use it anymore"- unsure of reaction   Fatty liver    per pt report   Gastroparesis    GERD (gastroesophageal reflux disease)  Heart murmur    History of hiatal hernia    pt states she no longer has this   Hypothyroidism    IBS (irritable bowel syndrome)    LBBB (left bundle branch block)    Memory difficulty 09/20/2014   Morbid obesity ([place])    Myocardial infarction ([place])    per pt- prior to CABG   Neuropathy    Obstructive sleep  apnea on CPAP    Restless legs syndrome (RLS) 09/17/2012   Stroke (cerebrum) ([place]) 05/16/2017   Left parietal   Urticaria     Past Surgical History: Past Surgical History:  Procedure Laterality Date   ABDOMINAL HYSTERECTOMY     ARTHROSCOPY KNEE W/ DRILLING  06/2011   and Dece[place] of 2013.   Carpel tunnel  1980's   CATARACT EXTRACTION W/PHACO Right 03/21/2017   Procedure: CATARACT EXTRACTION PHACO AND INTRAOCULAR LENS PLACEMENT RIGHT EYE;  Surgeon: Baruch Goldmann, MD;  Location: [facility];  Service: Ophthalmology;  Laterality: Right;  CDE: 2.91    CATARACT EXTRACTION W/PHACO Left 04/18/2017   Procedure: CATARACT EXTRACTION PHACO AND INTRAOCULAR LENS PLACEMENT LEFT EYE;  Surgeon: Baruch Goldmann, MD;  Location: [facility];  Service: Ophthalmology;  Laterality: Left;  left   CHOLECYSTECTOMY     COLONOSCOPY WITH PROPOFOL N/A 10/04/2016   Procedure: COLONOSCOPY WITH PROPOFOL;  Surgeon: Rogene Houston, MD;  [facility];  Service: Endoscopy;  Laterality: N/A;  11:10   CORONARY ARTERY BYPASS GRAFT N/A 08/29/2017   Procedure: CORONARY ARTERY BYPASS GRAFTING (CABG) x 3 WITH ENDOSCOPIC HARVESTING OF RIGHT SAPHENOUS VEIN;  Surgeon: Ivin Poot, MD;  Location: [facility];  Service: Open Heart Surgery;  Laterality: N/A;   ESOPHAGOGASTRODUODENOSCOPY N/A 04/25/2016   Procedure: ESOPHAGOGASTRODUODENOSCOPY (EGD);  Surgeon: Rogene Houston, MD;  [facility];  Service: Endoscopy;  Laterality: N/A;  730   LEFT HEART CATH AND CORONARY ANGIOGRAPHY N/A 08/27/2017   Procedure: LEFT HEART CATH AND CORONARY ANGIOGRAPHY;  Surgeon: Sherren Mocha, MD;  Location: [facility];  Service: Cardiovascular::  pLAD 95% - p-mLAD 50%, ostD1 90% -pD1 80%; ostOM1 90%; rPDA 80%. EF ~50-55% - HK of dital Anterolateral & Apical wall.  - Rec CVTS c/s   NECK SURGERY     pt reports having growth removed from the back of her neck- after 2021   OPEN REDUCTION INTERNAL FIXATION (ORIF) HAND Left 04/10/2020    Procedure: OPEN REDUCTION INTERNAL FIXATION (ORIF) HAND, left index finger;  Surgeon: Carole Civil, MD;  Location: [facility];  Service: Orthopedics;  Laterality: Left;  0.045 k wires   RECTAL SURGERY     fissure   SHOULDER SURGERY Left    arthroscopy in March of this year   TEE WITHOUT CARDIOVERSION N/A 08/29/2017   Procedure: TRANSESOPHAGEAL ECHOCARDIOGRAM (TEE);  Surgeon: Prescott Gum, Collier Salina, MD;  Location: [facility];  Service: Open Heart Surgery;  Laterality: N/A;   TRANSFORAMINAL LUMBAR INTERBODY FUSION W/ MIS 1 LEVEL Right 07/12/2021   Procedure: Minimally Invasive Surgery Transforaminal Lumbar Interbody Fusion  Lumbar four-five;  Surgeon: Vallarie Mare, MD;  Location: [facility];  Service: Neurosurgery;  Laterality: Right;   TRANSTHORACIC ECHOCARDIOGRAM  06/06/2017   Mild to moderate reduced EF 40 and 45%.  Anterior septal, inferoseptal and basal to mid inferior hypokinesis.  GR 1 DD.  No significant valvular lesion    Family History: Family History  Problem Relation Age of Onset   Hypertension Mother    Lymphoma Mother    Depression  Mother    Arthritis Father    Alcohol abuse Father    Hypertension Sister    Cancer Brother        kidney and lung   Alcohol abuse Brother    Alcohol abuse Paternal Uncle    Alcohol abuse Paternal Grandfather    Alcohol abuse Paternal Grandmother    Allergic rhinitis Neg Hx    Angioedema Neg Hx    Asthma Neg Hx    Atopy Neg Hx    Eczema Neg Hx    Immunodeficiency Neg Hx    Urticaria Neg Hx     Social History: Social History   Tobacco Use  Smoking Status Former   Types: Cigarettes   Quit date: 02/04/1971   Years since quitting: 50.8  Smokeless Tobacco Never  Tobacco Comments   smoked 2 cigarettes a day   Social History   Substance and Sexual Activity  Alcohol Use No   Alcohol/week: 0.0 standard drinks of alcohol   Social History   Substance and Sexual Activity  Drug Use No    Allergies: Allergies  Allergen Reactions    Amoxicillin Anaphylaxis and Other (See Comments)    Has patient had a PCN reaction causing immediate rash, facial/tongue/throat swelling, SOB or lightheadedness with hypotension: Yes Has patient had a PCN reaction causing severe rash involving mucus membranes or skin necrosis: Yes Has patient had a PCN reaction that required hospitalization: Yes Has patient had a PCN reaction occurring within the last 10 years: Yes If all of the above answers are "NO", then may proceed with Cephalosporin use.    Hydrocodone Anaphylaxis   Percocet [Oxycodone-Acetaminophen] Other (See Comments)    Causes chest pain /tightness. Pressure pain around her ribs.   Vancomycin Itching   Depacon [Valproate Sodium]    Depacon [Valproic Acid] Other (See Comments)    Causes falls    Prednisone Itching    Medications: Current Outpatient Medications  Medication Sig Dispense Refill   ALPRAZolam (XANAX) 0.25 MG tablet Take 1 tablet (0.25 mg total) by mouth at bedtime as needed for anxiety or sleep. 30 tablet 2   aspirin EC 81 MG tablet Take 1 tablet (81 mg total) by mouth daily. 90 tablet 3   buPROPion (WELLBUTRIN XL) 150 MG 24 hr tablet Take 1 tablet (150 mg total) by mouth every morning. 30 tablet 2   carvedilol (COREG) 6.25 MG tablet TAKE ONE TABLET BY MOUTH TWICE DAILY (MORNING,EVENING) (Patient taking differently: Take 6.25 mg by mouth 2 (two) times daily with a meal.) 180 tablet 2   cetirizine (ZYRTEC) 10 MG tablet Take 1 tablet tablet 1-2 times daily. (Patient taking differently: Take 10 mg by mouth at bedtime.) 60 tablet 5   diclofenac Sodium (VOLTAREN) 1 % GEL Apply 2 g topically 4 (four) times daily as needed (pain).     diphenhydrAMINE (BENADRYL) 25 mg capsule Take 25 mg by mouth as needed for allergies or itching.     empagliflozin (JARDIANCE) 10 MG TABS tablet Take 10 mg by mouth daily.     EPINEPHrine 0.3 mg/0.3 mL IJ SOAJ injection Inject 0.3 mg into the muscle once.     fluticasone (FLONASE) 50 MCG/ACT  nasal spray Place 1 spray into both nostrils daily.     glipiZIDE (GLUCOTROL XL) 5 MG 24 hr tablet TAKE ONE TABLET BY MOUTH DAILY WITH BREAKFAST (Patient taking differently: Take 5 mg by mouth 2 (two) times daily.) 90 tablet 0   lamoTRIgine (LAMICTAL) 150 MG tablet Take 1 tablet (  150 mg total) by mouth at bedtime. 30 tablet 2   levothyroxine (SYNTHROID) 112 MCG tablet Take 112 mcg by mouth daily before breakfast.     losartan (COZAAR) 25 MG tablet TAKE ONE TABLET BY MOUTH DAILY (MORNING) (Patient taking differently: Take 50 mg by mouth daily.) 90 tablet 2   meclizine (ANTIVERT) 25 MG tablet Take 1 tablet (25 mg total) by mouth 3 (three) times daily as needed for dizziness. (Patient taking differently: Take 25 mg by mouth daily as needed for dizziness.) 30 tablet 0   metFORMIN (GLUCOPHAGE) 500 MG tablet TAKE ONE TABLET BY MOUTH TWICE DAILY WITH A MEAL (MORNING ,EVENING) (Patient taking differently: Take 500 mg by mouth 2 (two) times daily with a meal.) 180 tablet 0   ondansetron (ZOFRAN) 4 MG tablet Take 1 tablet (4 mg total) by mouth every 8 (eight) hours as needed for nausea or vomiting. 20 tablet 0   OVER THE COUNTER MEDICATION Take 1 tablet by mouth daily as needed (pain). CBD Gummies     pantoprazole (PROTONIX) 40 MG tablet Take 1 tablet (40 mg total) by mouth daily before supper. 90 tablet 3   REPATHA SURECLICK 539 MG/ML SOAJ INJECT ONE DOSE INTO SKIN EVERY 14 DAYS. (Patient taking differently: Inject 140 mg into the skin every 14 (fourteen) days.) 2 mL 11   rOPINIRole (REQUIP) 3 MG tablet TAKE ONE TABLET BY MOUTH EVERY MORNING, ,NOON AND AT BEDTIME. (PRT PT MORNING,EVENING,BEDTIME) (Patient taking differently: Take 3 mg by mouth 3 (three) times daily.) 270 tablet 3   tiZANidine (ZANAFLEX) 2 MG tablet Take 2 mg by mouth 3 (three) times daily.     traMADol (ULTRAM) 50 MG tablet Take 100 mg by mouth daily as needed for moderate pain.     traMADol (ULTRAM) 50 MG tablet Take 2 tablets (100 mg total)  by mouth every 6 (six) hours as needed for moderate pain or severe pain. 30 tablet 0   traMADol (ULTRAM) 50 MG tablet Take by mouth.     venlafaxine XR (EFFEXOR-XR) 150 MG 24 hr capsule Take 2 capsules (300 mg total) by mouth at bedtime. 60 capsule 2   Vitamin D, Ergocalciferol, (DRISDOL) 1.25 MG (50000 UNIT) CAPS capsule Take 50,000 Units by mouth every 7 (seven) days. Sunday     No current facility-administered medications for this visit.    Review of Systems: GENERAL: negative for malaise, night sweats HEENT: No changes in hearing or vision, no nose bleeds or other nasal problems. NECK: Negative for lumps, goiter, pain and significant neck swelling RESPIRATORY: Negative for cough, wheezing CARDIOVASCULAR: Negative for chest pain, leg swelling, palpitations, orthopnea GI: SEE HPI MUSCULOSKELETAL: Negative for joint pain or swelling, back pain, and muscle pain. SKIN: Negative for lesions, rash PSYCH: Negative for sleep disturbance, mood disorder and recent psychosocial stressors. HEMATOLOGY Negative for prolonged bleeding, bruising easily, and swollen nodes. ENDOCRINE: Negative for cold or heat intolerance, polyuria, polydipsia and goiter. NEURO: negative for tremor, gait imbalance, syncope and seizures. The remainder of the review of systems is noncontributory.   Physical Exam: BP 123/78 (BP Location: Left Arm, Patient Position: Sitting, Cuff Size: Large)   Pulse 87   Temp 98.3 F (36.8 C) (Oral)   Ht 5' (1.524 m)   Wt 226 lb 11.2 oz (102.8 kg)   BMI 44.27 kg/m  GENERAL: The patient is AO x3, in no acute distress. Obese. HEENT: Head is normocephalic and atraumatic. EOMI are intact. Mouth is well hydrated and without lesions. NECK: Supple. No  masses LUNGS: Clear to auscultation. No presence of rhonchi/wheezing/rales. Adequate chest expansion HEART: RRR, normal s1 and s2. ABDOMEN: Soft, nontender, no guarding, no peritoneal signs, and nondistended. BS +. No masses. EXTREMITIES:  Without any cyanosis, clubbing, rash, lesions or edema. NEUROLOGIC: AOx3, no focal motor deficit. SKIN: no jaundice, no rashes  Imaging/Labs: as above  I personally reviewed and interpreted the available labs, imaging and endoscopic files.  Impression and Plan: Norma [name] is a 71 y.o. female with past medical history of bipolar disorder, iron deficiency anemia, CKD, COPD, coronary artery disease status post CABG, depression, diabetes, diabetic neuropathy, hyperlipidemia, fatty liver, gastroparesis, GERD, hypothyroidism, IBS, morbid obesity, stroke, restless leg syndrome, who presents for evaluation of nausea and bloating.  The patient has presented new onset of upper abdominal symptoms which have incompetence nausea, occasional abdominal pain and bloating.  Has not presented any red flag signs.  We will explore further her symptoms with an EGD and she can take Zofran as needed for now to continue controlling her nausea.  If these investigations are negative, we may need to consider a CT of the abdomen pelvis and an eventual gastric ending study.  - Schedule EGD  - Can take Zofran as needed for nausea - If negative testing, we will consider CT abdomen/pelvis and gastric emptying study  All questions were answered.      Harvel Quale, MD Gastroenterology and Hepatology [facility]

## 2021-11-12 NOTE — Telephone Encounter (Signed)
Patient called and is wanting to know what her cpap setting should be?  Please advise sir

## 2021-11-12 NOTE — Telephone Encounter (Signed)
Called and spoke w/ pt. Advised it appears she had sleep consult with Dr. Elsworth Soho 05/2021. Advised Dr. Elsworth Soho would handle CPAP therapy at this point. She states she does not have appt with him until 11/20/21. Recommended she call their office today. Also offered option of her calling [employer] to see if they can help set pressure. Appears she may have been at 14cm but Dr. Elsworth Soho would need to review first. She ended call before I could provide her phone#'s.  Dr. Alva:([phone]) [place]: [phone]

## 2021-11-13 ENCOUNTER — Encounter (INDEPENDENT_AMBULATORY_CARE_PROVIDER_SITE_OTHER): Payer: Self-pay

## 2021-11-13 NOTE — Telephone Encounter (Signed)
Called and spoke to patient about settings. And she states she got a new machine and was wanting to know. I advised her to call the DME company and they will set her machine. Nothing further needed

## 2021-11-14 ENCOUNTER — Ambulatory Visit: Payer: Self-pay | Admitting: *Deleted

## 2021-11-14 ENCOUNTER — Ambulatory Visit (HOSPITAL_COMMUNITY)
Admission: RE | Admit: 2021-11-14 | Discharge: 2021-11-14 | Disposition: A | Discharge status: Home / Self Care | Payer: Medicare Other | Source: Ambulatory Visit | Attending: Neurosurgery | Admitting: Neurosurgery

## 2021-11-14 DIAGNOSIS — M5416 Radiculopathy, lumbar region: Secondary | ICD-10-CM | POA: Insufficient documentation

## 2021-11-14 DIAGNOSIS — M4186 Other forms of scoliosis, lumbar region: Secondary | ICD-10-CM | POA: Diagnosis not present

## 2021-11-14 DIAGNOSIS — M48061 Spinal stenosis, lumbar region without neurogenic claudication: Secondary | ICD-10-CM | POA: Diagnosis not present

## 2021-11-14 DIAGNOSIS — G8929 Other chronic pain: Secondary | ICD-10-CM | POA: Diagnosis not present

## 2021-11-14 DIAGNOSIS — M5126 Other intervertebral disc displacement, lumbar region: Secondary | ICD-10-CM | POA: Diagnosis not present

## 2021-11-14 NOTE — Patient Outreach (Signed)
  Care Coordination   Follow Up Visit Note   11/14/2021 Name: Norma [name] MRN: [id number] DOB: 15-Jun-1950  Norma [name] is a 71 y.o. year old female who sees [name], Edwinna Areola, MD for primary care. I spoke with  [name] by phone today  What matters to the patients health and wellness today?   Only transportation resources, no further [facility] SW resources identified with further social determinants of health (SDOH) assessment completed. She does still report need of some home cleaning and cooking. She does not qualified for [employer] (CAP) services. She reports she can pay a little at an hourly rate of $15/hour   With SDOH assessment for housing assessment she reports one of her 2 Cat has recently been bringing in rats she believes possibly from outside. She will updated RN Cm later is this becomes a larger concern and a SW consult is needed  [facility] SW to be updated during 11/15/21 [place] pod 1 team meeting   Other concern today is symptoms of nausea for half a day She is pending an EGD to be completed by Dr Jenetta Downer, Gastroenterologist She does not prefer a colonoscopy  There is a history of chronic stomach symptoms for her and her mother plus her grandmother had GI cancer  She is able to remain hydrated and takes in foods like ice cream, tea, water, boost, and small meals every day    Goals Addressed               This Visit's Progress     Patient Stated     nausea management [facility]) (pt-stated)   Not on track     Start date 11/14/21   Care Coordination Interventions: advised to eat small frequent meals advised to eat snacks containing protein as tolerated during the day advised to keep crackers at the bedside to have before rising each morning advised to keep popsicles on hand for easy hydration        SDOH assessments and interventions completed:  Yes  SDOH Interventions Today    Flowsheet Row Most Recent Value  SDOH Interventions   Food  Insecurity Interventions Intervention Not Indicated  Housing Interventions Intervention Not Indicated  Depression Interventions/Treatment  Counseling, Currently on Treatment        Care Coordination Interventions Activated:  Yes  Care Coordination Interventions:  Yes, provided    Follow up plan: Follow up call scheduled for 12/18/21 1 pm     Encounter Outcome:  Pt. Visit Completed    Cassian Torelli L. Lavina Hamman, RN, BSN, [place] Coordinator Office number [phone]

## 2021-11-19 DIAGNOSIS — M5416 Radiculopathy, lumbar region: Secondary | ICD-10-CM | POA: Diagnosis not present

## 2021-11-22 DIAGNOSIS — E1122 Type 2 diabetes mellitus with diabetic chronic kidney disease: Secondary | ICD-10-CM | POA: Diagnosis not present

## 2021-11-22 DIAGNOSIS — I129 Hypertensive chronic kidney disease with stage 1 through stage 4 chronic kidney disease, or unspecified chronic kidney disease: Secondary | ICD-10-CM | POA: Diagnosis not present

## 2021-11-22 DIAGNOSIS — E611 Iron deficiency: Secondary | ICD-10-CM | POA: Diagnosis not present

## 2021-11-22 DIAGNOSIS — I5042 Chronic combined systolic (congestive) and diastolic (congestive) heart failure: Secondary | ICD-10-CM | POA: Diagnosis not present

## 2021-11-22 DIAGNOSIS — N189 Chronic kidney disease, unspecified: Secondary | ICD-10-CM | POA: Diagnosis not present

## 2021-11-26 ENCOUNTER — Encounter (HOSPITAL_COMMUNITY): Payer: Self-pay | Admitting: Psychiatry

## 2021-11-26 ENCOUNTER — Encounter (HOSPITAL_COMMUNITY): Payer: Self-pay

## 2021-11-26 ENCOUNTER — Telehealth (INDEPENDENT_AMBULATORY_CARE_PROVIDER_SITE_OTHER): Payer: Medicare Other | Admitting: Psychiatry

## 2021-11-26 ENCOUNTER — Encounter (INDEPENDENT_AMBULATORY_CARE_PROVIDER_SITE_OTHER): Payer: Self-pay | Admitting: Gastroenterology

## 2021-11-26 DIAGNOSIS — F331 Major depressive disorder, recurrent, moderate: Secondary | ICD-10-CM | POA: Diagnosis not present

## 2021-11-26 MED ORDER — BUPROPION HCL ER (XL) 150 MG PO TB24: 150.0000 mg | ORAL_TABLET | Freq: Every morning | ORAL | 2 refills | Status: DC

## 2021-11-26 MED ORDER — ARIPIPRAZOLE 2 MG PO TABS: 2.0000 mg | ORAL_TABLET | Freq: Every day | ORAL | 2 refills | Status: DC

## 2021-11-26 MED ORDER — ALPRAZOLAM 0.25 MG PO TABS
0.2500 mg | ORAL_TABLET | Freq: Every evening | ORAL | 2 refills | Status: DC | PRN
Start: 2021-11-26 — End: 2022-01-21

## 2021-11-26 MED ORDER — LAMOTRIGINE 150 MG PO TABS: 150.0000 mg | ORAL_TABLET | Freq: Every day | ORAL | 2 refills | Status: DC

## 2021-11-26 MED ORDER — VENLAFAXINE HCL ER 150 MG PO CP24: 300.0000 mg | ORAL_CAPSULE | Freq: Every day | ORAL | 2 refills | Status: DC

## 2021-11-26 NOTE — Progress Notes (Signed)
Virtual Visit via Telephone Note  I connected with Norma [name] on 11/26/21 at  1:00 PM EDT by telephone and verified that I am speaking with the correct person using two identifiers.  Location: Patient: home Provider: office   I discussed the limitations, risks, security and privacy concerns of performing an evaluation and management service by telephone and the availability of in person appointments. I also discussed with the patient that there may be a patient responsible charge related to this service. The patient expressed understanding and agreed to proceed.      I discussed the assessment and treatment plan with the patient. The patient was provided an opportunity to ask questions and all were answered. The patient agreed with the plan and demonstrated an understanding of the instructions.   The patient was advised to call back or seek an in-person evaluation if the symptoms worsen or if the condition fails to improve as anticipated.  I provided 20 minutes of non-face-to-face time during this encounter.   [name], MD  [facility] MD/PA/NP OP Progress Note  11/26/2021 1:22 PM Norma [name]  MRN:  [id number]  Chief Complaint:  Chief Complaint  Patient presents with   Anxiety   Depression   Follow-up   HPI: This patient is a 71 year old single white female who lives alone in [place].  She is a Equities trader.  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  The patient returns for follow-up after 3 months.  She states she has been more depressed lately.  She has had some conflicts with people at her church.  She is still not sleeping very well.  She has been tried on numerous antidepressants.  She denies feeling suicidal and is enjoying her job.  She is still having a lot of medical issues such as back pain restless legs etc.  She asked about changing her antidepressant.  I suggested we do an augmentation with Abilify versus Effexor so  hard to get off of and when she took the higher dose of Abilify she had more facial twitching.  She is willing to give this a try Visit Diagnosis:    ICD-10-CM   1. Moderate episode of recurrent major depressive disorder ([place])  F33.1       Past Psychiatric History: She was hospitalized in 2001 and went through a course of ECT.  She has been tried on [employer] Wellbutrin and Zyprexa  Past Medical History:  Past Medical History:  Diagnosis Date   Anemia    Anxiety    Bipolar disorder (HCC)    Bulging lumbar disc    L3-4   Chronic daily headache    Chronic low back pain 09/20/2014   CKD (chronic kidney disease)    stage 3   COPD (chronic obstructive pulmonary disease) (HCC)    Coronary artery disease    CABG 2019   Degenerative arthritis    Depression    Diabetes mellitus, type II (HCC)    Diabetic neuropathy ([place]) 09/16/2018   DM type 2 with diabetic peripheral neuropathy ([place]) 09/20/2014   Dyslipidemia    Dyspnea    Family history of adverse reaction to anesthesia    father had reaction to anesthesia medication per patient "they don't use it anymore"- unsure of reaction   Fatty liver    per pt report   Gastroparesis    GERD (gastroesophageal reflux disease)    Heart murmur    History of hiatal  hernia    pt states she no longer has this   Hypothyroidism    IBS (irritable bowel syndrome)    LBBB (left bundle branch block)    Memory difficulty 09/20/2014   Morbid obesity ([place])    Myocardial infarction ([place])    per pt- prior to CABG   Neuropathy    Obstructive sleep apnea on CPAP    Restless legs syndrome (RLS) 09/17/2012   Stroke (cerebrum) ([place]) 05/16/2017   Left parietal   Urticaria     Past Surgical History:  Procedure Laterality Date   ABDOMINAL HYSTERECTOMY     ARTHROSCOPY KNEE W/ DRILLING  06/2011   and Dece[place] of 2013.   Carpel tunnel  1980's   CATARACT EXTRACTION W/PHACO Right 03/21/2017   Procedure: CATARACT EXTRACTION PHACO AND INTRAOCULAR LENS  PLACEMENT RIGHT EYE;  Surgeon: Baruch Goldmann, MD;  Location: [facility];  Service: Ophthalmology;  Laterality: Right;  CDE: 2.91    CATARACT EXTRACTION W/PHACO Left 04/18/2017   Procedure: CATARACT EXTRACTION PHACO AND INTRAOCULAR LENS PLACEMENT LEFT EYE;  Surgeon: Baruch Goldmann, MD;  Location: [facility];  Service: Ophthalmology;  Laterality: Left;  left   CHOLECYSTECTOMY     COLONOSCOPY WITH PROPOFOL N/A 10/04/2016   Procedure: COLONOSCOPY WITH PROPOFOL;  Surgeon: Rogene Houston, MD;  [facility];  Service: Endoscopy;  Laterality: N/A;  11:10   CORONARY ARTERY BYPASS GRAFT N/A 08/29/2017   Procedure: CORONARY ARTERY BYPASS GRAFTING (CABG) x 3 WITH ENDOSCOPIC HARVESTING OF RIGHT SAPHENOUS VEIN;  Surgeon: Ivin Poot, MD;  Location: [facility];  Service: Open Heart Surgery;  Laterality: N/A;   ESOPHAGOGASTRODUODENOSCOPY N/A 04/25/2016   Procedure: ESOPHAGOGASTRODUODENOSCOPY (EGD);  Surgeon: Rogene Houston, MD;  [facility];  Service: Endoscopy;  Laterality: N/A;  730   LEFT HEART CATH AND CORONARY ANGIOGRAPHY N/A 08/27/2017   Procedure: LEFT HEART CATH AND CORONARY ANGIOGRAPHY;  Surgeon: Sherren Mocha, MD;  Location: [facility];  Service: Cardiovascular::  pLAD 95% - p-mLAD 50%, ostD1 90% -pD1 80%; ostOM1 90%; rPDA 80%. EF ~50-55% - HK of dital Anterolateral & Apical wall.  - Rec CVTS c/s   NECK SURGERY     pt reports having growth removed from the back of her neck- after 2021   OPEN REDUCTION INTERNAL FIXATION (ORIF) HAND Left 04/10/2020   Procedure: OPEN REDUCTION INTERNAL FIXATION (ORIF) HAND, left index finger;  Surgeon: Carole Civil, MD;  Location: [facility];  Service: Orthopedics;  Laterality: Left;  0.045 k wires   RECTAL SURGERY     fissure   SHOULDER SURGERY Left    arthroscopy in March of this year   TEE WITHOUT CARDIOVERSION N/A 08/29/2017   Procedure: TRANSESOPHAGEAL ECHOCARDIOGRAM (TEE);  Surgeon: Prescott Gum, Collier Salina, MD;  Location: [facility];  Service:  Open Heart Surgery;  Laterality: N/A;   TRANSFORAMINAL LUMBAR INTERBODY FUSION W/ MIS 1 LEVEL Right 07/12/2021   Procedure: Minimally Invasive Surgery Transforaminal Lumbar Interbody Fusion  Lumbar four-five;  Surgeon: Vallarie Mare, MD;  Location: [facility];  Service: Neurosurgery;  Laterality: Right;   TRANSTHORACIC ECHOCARDIOGRAM  06/06/2017   Mild to moderate reduced EF 40 and 45%.  Anterior septal, inferoseptal and basal to mid inferior hypokinesis.  GR 1 DD.  No significant valvular lesion    Family Psychiatric History: see below  Family History:  Family History  Problem Relation Age of Onset   Hypertension Mother    Lymphoma Mother    Depression Mother  Arthritis Father    Alcohol abuse Father    Hypertension Sister    Cancer Brother        kidney and lung   Alcohol abuse Brother    Alcohol abuse Paternal Uncle    Alcohol abuse Paternal Grandfather    Alcohol abuse Paternal Grandmother    Allergic rhinitis Neg Hx    Angioedema Neg Hx    Asthma Neg Hx    Atopy Neg Hx    Eczema Neg Hx    Immunodeficiency Neg Hx    Urticaria Neg Hx     Social History:  Social History   Socioeconomic History   Marital status: Single    Spouse name: Not on file   Number of children: 0   Years of education: 16   Highest education level: Not on file  Occupational History    Employer: [employer]  Tobacco Use   Smoking status: Former    Types: Cigarettes    Quit date: 02/04/1971    Years since quitting: 50.8   Smokeless tobacco: Never   Tobacco comments:    smoked 2 cigarettes a day  Vaping Use   Vaping Use: Never used  Substance and Sexual Activity   Alcohol use: No    Alcohol/week: 0.0 standard drinks of alcohol   Drug use: No   Sexual activity: Never  Other Topics Concern   Not on file  Social History Narrative   Patient lives at home alone.    Patient has no children.    Patient has her masters in nursing.    Patient is single.    Patient drinks about 2  glasses of tea daily.   Patient is right handed.   Social Determinants of Health   Financial Resource Strain: Low Risk  (10/04/2021)   Overall Financial Resource Strain (CARDIA)    Difficulty of Paying Living Expenses: Not very hard  Food Insecurity: No Food Insecurity (11/14/2021)   Hunger Vital Sign    Worried About Running Out of Food in the Last Year: Never true    Ran Out of Food in the Last Year: Never true  Transportation Needs: No Transportation Needs (10/04/2021)   PRAPARE - Hydrologist (Medical): No    Lack of Transportation (Non-Medical): No  Physical Activity: Not on file  Stress: No Stress Concern Present (10/04/2021)   [place]    Feeling of Stress : Only a little  Social Connections: Not on file    Allergies:  Allergies  Allergen Reactions   Amoxicillin Anaphylaxis and Other (See Comments)    Has patient had a PCN reaction causing immediate rash, facial/tongue/throat swelling, SOB or lightheadedness with hypotension: Yes Has patient had a PCN reaction causing severe rash involving mucus membranes or skin necrosis: Yes Has patient had a PCN reaction that required hospitalization: Yes Has patient had a PCN reaction occurring within the last 10 years: Yes If all of the above answers are "NO", then may proceed with Cephalosporin use.    Hydrocodone Anaphylaxis   Percocet [Oxycodone-Acetaminophen] Other (See Comments)    Causes chest pain /tightness. Pressure pain around her ribs.   Vancomycin Itching   Depacon [Valproate Sodium]    Depacon [Valproic Acid] Other (See Comments)    Causes falls    Prednisone Itching    Metabolic Disorder Labs: Lab Results  Component Value Date   HGBA1C 7.4 (H) 07/05/2021   MPG 165.68 07/05/2021  MPG 148 03/05/2019   No results found for: "PROLACTIN" Lab Results  Component Value Date   CHOL 129 11/08/2019   TRIG 158 11/08/2019    HDL 60 11/08/2019   CHOLHDL 3.8 03/05/2019   VLDL 31 (H) 07/10/2016   LDLCALC 43 11/08/2019   LDLCALC 152 (H) 03/05/2019   Lab Results  Component Value Date   TSH 1.49 03/05/2019   TSH 2.671 10/30/2018    Therapeutic Level Labs: No results found for: "LITHIUM" No results found for: "VALPROATE" No results found for: "CBMZ"  Current Medications: Current Outpatient Medications  Medication Sig Dispense Refill   ARIPiprazole (ABILIFY) 2 MG tablet Take 1 tablet (2 mg total) by mouth daily. 30 tablet 2   ALPRAZolam (XANAX) 0.25 MG tablet Take 1 tablet (0.25 mg total) by mouth at bedtime as needed for anxiety or sleep. 30 tablet 2   aspirin EC 81 MG tablet Take 1 tablet (81 mg total) by mouth daily. 90 tablet 3   buPROPion (WELLBUTRIN XL) 150 MG 24 hr tablet Take 1 tablet (150 mg total) by mouth every morning. 30 tablet 2   carvedilol (COREG) 6.25 MG tablet TAKE ONE TABLET BY MOUTH TWICE DAILY (MORNING,EVENING) (Patient taking differently: Take 6.25 mg by mouth 2 (two) times daily with a meal.) 180 tablet 2   cetirizine (ZYRTEC) 10 MG tablet Take 1 tablet tablet 1-2 times daily. (Patient taking differently: Take 10 mg by mouth at bedtime.) 60 tablet 5   diclofenac Sodium (VOLTAREN) 1 % GEL Apply 2 g topically 4 (four) times daily as needed (pain).     diphenhydrAMINE (BENADRYL) 25 mg capsule Take 25 mg by mouth as needed for allergies or itching.     empagliflozin (JARDIANCE) 10 MG TABS tablet Take 10 mg by mouth daily.     EPINEPHrine 0.3 mg/0.3 mL IJ SOAJ injection Inject 0.3 mg into the muscle once.     fluticasone (FLONASE) 50 MCG/ACT nasal spray Place 1 spray into both nostrils daily.     lamoTRIgine (LAMICTAL) 150 MG tablet Take 1 tablet (150 mg total) by mouth at bedtime. 30 tablet 2   levothyroxine (SYNTHROID) 112 MCG tablet Take 112 mcg by mouth daily before breakfast.     meclizine (ANTIVERT) 25 MG tablet Take 1 tablet (25 mg total) by mouth 3 (three) times daily as needed for  dizziness. (Patient taking differently: Take 25 mg by mouth daily as needed for dizziness.) 30 tablet 0   metFORMIN (GLUCOPHAGE) 500 MG tablet TAKE ONE TABLET BY MOUTH TWICE DAILY WITH A MEAL (MORNING ,EVENING) (Patient taking differently: Take 500 mg by mouth 2 (two) times daily with a meal.) 180 tablet 0   ondansetron (ZOFRAN) 4 MG tablet Take 1 tablet (4 mg total) by mouth every 8 (eight) hours as needed for nausea or vomiting. 20 tablet 0   OVER THE COUNTER MEDICATION Take 1 tablet by mouth daily as needed (pain). CBD Gummies     pantoprazole (PROTONIX) 40 MG tablet Take 1 tablet (40 mg total) by mouth daily before supper. 90 tablet 3   REPATHA SURECLICK 032 MG/ML SOAJ INJECT ONE DOSE INTO SKIN EVERY 14 DAYS. (Patient taking differently: Inject 140 mg into the skin every 14 (fourteen) days.) 2 mL 11   rOPINIRole (REQUIP) 3 MG tablet TAKE ONE TABLET BY MOUTH EVERY MORNING, ,NOON AND AT BEDTIME. (PRT PT MORNING,EVENING,BEDTIME) (Patient taking differently: Take 3 mg by mouth 3 (three) times daily.) 270 tablet 3   traMADol (ULTRAM) 50 MG tablet Take  2 tablets (100 mg total) by mouth every 6 (six) hours as needed for moderate pain or severe pain. 30 tablet 0   venlafaxine XR (EFFEXOR-XR) 150 MG 24 hr capsule Take 2 capsules (300 mg total) by mouth at bedtime. 60 capsule 2   Vitamin D, Ergocalciferol, (DRISDOL) 1.25 MG (50000 UNIT) CAPS capsule Take 50,000 Units by mouth every 7 (seven) days. Sunday     No current facility-administered medications for this visit.     Musculoskeletal: Strength & Muscle Tone: na Gait & Station: na Patient leans: N/A  Psychiatric Specialty Exam: Review of Systems  Musculoskeletal:  Positive for back pain.  Psychiatric/Behavioral:  Positive for dysphoric mood and sleep disturbance.   All other systems reviewed and are negative.   There were no vitals taken for this visit.There is no height or weight on file to calculate BMI.  General Appearance: NA  Eye  Contact:  NA  Speech:  Clear and Coherent  Volume:  Normal  Mood:  Dysphoric  Affect:  NA  Thought Process:  Goal Directed  Orientation:  Full (Time, Place, and Person)  Thought Content: Rumination   Suicidal Thoughts:  No  Homicidal Thoughts:  No  Memory:  Immediate;   Good Recent;   Good Remote;   Fair  Judgement:  Fair  Insight:  Fair  Psychomotor Activity:  Decreased  Concentration:  Concentration: Good and Attention Span: Good  Recall:  Good  Fund of Knowledge: Good  Language: Good  Akathisia:  No  Handed:  Right  AIMS (if indicated): not done  Assets:  Communication Skills Desire for Improvement Resilience Social Support  ADL's:  Intact  Cognition: WNL  Sleep:  Poor   Screenings: Mini-Mental    Flowsheet Row Office Visit from 02/18/2018 in [facility] Office Visit from 09/17/2017 in [facility]  Total Score (max 30 points ) 28 28      PHQ2-9    Flowsheet Row Video Visit from 11/26/2021 in [facility] Coordination from 11/14/2021 in [facility] Patient Outreach Telephone from 10/04/2021 in [facility] Video Visit from 08/30/2021 in [facility] Video Visit from 05/22/2021 in [facility]  PHQ-2 Total Score '4 2 1 '$ 0 0  PHQ-9 Total Score 9 11 -- -- --      Flowsheet Row Video Visit from 08/30/2021 in [facility] (Discharged) from 07/12/2021 in [facility] Pre-Admission Testing 60 from 07/05/2021 in [facility] PREADMISSION TESTING  C-SSRS RISK CATEGORY No Risk No Risk No Risk        Assessment and Plan: This patient is a 71 year old female with a history of depression anxiety mood swings and insomnia.  She does not feel like she is doing  quite as well lately.  She will add Abilify 2 mg to her regimen for augmentation, continue Wellbutrin XL 150 mg daily as well as Effexor XR 300 mg daily for depression Lamictal 100 mg daily for mood stabilization and Xanax 0.25 mg at bedtime for anxiety and sleep as needed.  She will return to see me in 2 months  Collaboration of Care: Collaboration of Care: Primary Care Provider AEB notes will be shared with PCP at patient's request  Patient/Guardian was advised Release of Information must be obtained prior to any record release in order to collaborate their care with an outside provider. Patient/Guardian was advised  if they have not already done so to contact the registration department to sign all necessary forms in order for [place] to release information regarding their care.   Consent: Patient/Guardian gives verbal consent for treatment and assignment of benefits for services provided during this visit. Patient/Guardian expressed understanding and agreed to proceed.    [name], MD 11/26/2021, 1:22 PM

## 2021-11-27 ENCOUNTER — Telehealth: Payer: Self-pay | Admitting: Orthopedic Surgery

## 2021-11-27 DIAGNOSIS — M2550 Pain in unspecified joint: Secondary | ICD-10-CM

## 2021-11-27 NOTE — Telephone Encounter (Signed)
Call received from patient relaying she is ready to have Dr Aline Brochure make the referral to rheumatologist; states her joint pain 'all over' is to the point she would like to proceed with referral. Phone: [phone]

## 2021-11-29 ENCOUNTER — Other Ambulatory Visit: Payer: Self-pay | Admitting: *Deleted

## 2021-11-29 NOTE — Patient Outreach (Signed)
  Care Coordination   [facility] case discussion  Visit Note   11/29/2021 Name: Norma [name] MRN: [id number] DOB: 07-10-1950  Norma [name] is a 71 y.o. year old female who sees Norma [name], Norma Areola, MD for primary care. I   spoke with [facility] pod [facility] team members   What matters to the patients health and wellness today?  Patient progression, follow up    Goals Addressed               This Visit's Progress     Patient Stated     nausea management [facility]) (pt-stated)   On track     Start date 11/14/21   Care Coordination Interventions: advised to eat small frequent meals advised to eat snacks containing protein as tolerated during the day advised to keep crackers at the bedside to have before rising each morning advised to keep popsicles on hand for easy hydration 11/14/21  Nausea addressed pending EGD vs colonoscopy hx chronic stomach s/s, family hx GI CA, nutrition discussed  11/08/21, 11/15/21 & 11/29/21 [facility] pod 1 team discussions No further SDOH or transportation resource needs reported by pt on 11/14/21- continue monthly outreaches        SDOH assessments and interventions completed:  No     Care Coordination Interventions Activated:  Yes  Care Coordination Interventions:  Yes, provided   Follow up plan: Follow up call scheduled for 12/19/21    Encounter Outcome:  Pt. Visit Completed   Norma [name] L. Norma Hamman, RN, BSN, [place] Coordinator Office number [phone]

## 2021-12-03 ENCOUNTER — Ambulatory Visit: Payer: Medicare Other | Admitting: Pulmonary Disease

## 2021-12-04 ENCOUNTER — Encounter: Payer: Self-pay | Admitting: Pulmonary Disease

## 2021-12-04 ENCOUNTER — Ambulatory Visit (INDEPENDENT_AMBULATORY_CARE_PROVIDER_SITE_OTHER): Payer: Medicare Other | Admitting: Pulmonary Disease

## 2021-12-04 VITALS — BP 132/74 | HR 64 | Temp 97.6°F | Ht 60.0 in | Wt 231.6 lb

## 2021-12-04 DIAGNOSIS — G4733 Obstructive sleep apnea (adult) (pediatric): Secondary | ICD-10-CM

## 2021-12-04 DIAGNOSIS — G4734 Idiopathic sleep related nonobstructive alveolar hypoventilation: Secondary | ICD-10-CM

## 2021-12-04 DIAGNOSIS — I2510 Atherosclerotic heart disease of native coronary artery without angina pectoris: Secondary | ICD-10-CM

## 2021-12-04 NOTE — Assessment & Plan Note (Signed)
She seems to be compliant by her subjective report.  Unable to check download and confirm objectively since her ST card does not seem to be compatible. She would like to switch DME to [facility] and we will ask them to provide [place] with a download report. It is certainly okay for her to use a nasal mask since she has tolerated that better when if she has a mouth leak.  Weight loss encouraged, compliance with goal of at least 4-6 hrs every night is the expectation. Advised against medications with sedative side effects Cautioned against driving when sleepy - understanding that sleepiness will vary on a day to day basis

## 2021-12-04 NOTE — Assessment & Plan Note (Signed)
Continue to blend 2 L of oxygen into her CPAP machine unclear cause of nocturnal hypoxia -lung function appears okay no evidence of ILD or pulm hypertension

## 2021-12-04 NOTE — Patient Instructions (Signed)
   OK to use nasal mask OK to change DME to [facility]

## 2021-12-04 NOTE — Progress Notes (Signed)
   Subjective:    Patient ID: Norma [name], female    DOB: 22-Jun-1950, 71 y.o.   MRN: [id number]  HPI   71 year old minimal remote smoker for FU of nocturnal hypoxia and OSA, referred by neurology. She is a retired Marine scientist, and reports diagnosis of OSA for more than 20 years maintained on CPAP.  DME for CPAP is [facility] She was started on oxygen after sleep study in 2021   PMH -diabetes, hypertension, CKD stage IIIb, spinal stenosis, bilateral TKR, CABG 2019,  HFpEF -Restless leg syndrome  Chief Complaint  Patient presents with   Follow-up    Cpap mask bothering patient.     She obtained a CPAP from [facility].  For some reason she was given a fullface mask and she is unable to tolerate.  CPAP is set at 14 cm [facility] seems to tolerate this pressure okay.  Was tolerating nasal mask well prior. She has oxygen blended into her CPAP  We reviewed nocturnal oximetry. Breathing is otherwise okay  Significant tests/ events reviewed  04/2018 PFTs no airway obstruction with ratio 84, FEV1 1 to 2%, FVC 93%, TLC 100%, DLCO 72%.  06/2021 ONO on CPAP/room air >> desaturation for 4 hours ONO on CPAP/2 L oxygen showed desaturation for 54 minutes.  12/2020 ONO on CPAP/5 L oxygen showed no desaturation   09/2019 NPSG TST 340 minutes, AHI 9.2/hour, lowest desaturation 75%, desaturation less than 88% for 5 hours   10/2019 CPAP titration, 243 pounds, CPAP 14 cm with 2 L oxygen   CT chest 05/2016 no ILD   Review of Systems neg for any significant sore throat, dysphagia, itching, sneezing, nasal congestion or excess/ purulent secretions, fever, chills, sweats, unintended wt loss, pleuritic or exertional cp, hempoptysis, orthopnea pnd or change in chronic leg swelling. Also denies presyncope, palpitations, heartburn, abdominal pain, nausea, vomiting, diarrhea or change in bowel or urinary habits, dysuria,hematuria, rash, arthralgias, visual complaints, headache, numbness  weakness or ataxia.     Objective:   Physical Exam  Gen. Pleasant, obese, in no distress ENT - no lesions, no post nasal drip Neck: No JVD, no thyromegaly, no carotid bruits Lungs: no use of accessory muscles, no dullness to percussion, decreased without rales or rhonchi  Cardiovascular: Rhythm regular, heart sounds  normal, no murmurs or gallops, no peripheral edema Musculoskeletal: No deformities, no cyanosis or clubbing , no tremors        Assessment & Plan:

## 2021-12-05 DIAGNOSIS — M25562 Pain in left knee: Secondary | ICD-10-CM | POA: Diagnosis not present

## 2021-12-05 DIAGNOSIS — M79642 Pain in left hand: Secondary | ICD-10-CM | POA: Diagnosis not present

## 2021-12-05 DIAGNOSIS — M25561 Pain in right knee: Secondary | ICD-10-CM | POA: Diagnosis not present

## 2021-12-05 DIAGNOSIS — M170 Bilateral primary osteoarthritis of knee: Secondary | ICD-10-CM | POA: Diagnosis not present

## 2021-12-05 DIAGNOSIS — M7542 Impingement syndrome of left shoulder: Secondary | ICD-10-CM | POA: Diagnosis not present

## 2021-12-05 DIAGNOSIS — M79641 Pain in right hand: Secondary | ICD-10-CM | POA: Diagnosis not present

## 2021-12-07 ENCOUNTER — Telehealth: Payer: Self-pay | Admitting: Pulmonary Disease

## 2021-12-07 DIAGNOSIS — G4733 Obstructive sleep apnea (adult) (pediatric): Secondary | ICD-10-CM

## 2021-12-10 NOTE — Telephone Encounter (Signed)
Order placed for DME switch. Nothing further needed at this time.

## 2021-12-12 ENCOUNTER — Ambulatory Visit: Payer: Medicare Other | Admitting: Pulmonary Disease

## 2021-12-12 ENCOUNTER — Other Ambulatory Visit (HOSPITAL_COMMUNITY): Payer: Medicare Other

## 2021-12-13 ENCOUNTER — Other Ambulatory Visit: Payer: Self-pay | Admitting: Cardiology

## 2021-12-13 DIAGNOSIS — I130 Hypertensive heart and chronic kidney disease with heart failure and stage 1 through stage 4 chronic kidney disease, or unspecified chronic kidney disease: Secondary | ICD-10-CM | POA: Diagnosis not present

## 2021-12-13 DIAGNOSIS — I10 Essential (primary) hypertension: Secondary | ICD-10-CM | POA: Diagnosis not present

## 2021-12-13 DIAGNOSIS — E1121 Type 2 diabetes mellitus with diabetic nephropathy: Secondary | ICD-10-CM | POA: Diagnosis not present

## 2021-12-13 DIAGNOSIS — I5032 Chronic diastolic (congestive) heart failure: Secondary | ICD-10-CM | POA: Diagnosis not present

## 2021-12-13 DIAGNOSIS — N1832 Chronic kidney disease, stage 3b: Secondary | ICD-10-CM | POA: Diagnosis not present

## 2021-12-13 DIAGNOSIS — E119 Type 2 diabetes mellitus without complications: Secondary | ICD-10-CM | POA: Diagnosis not present

## 2021-12-14 ENCOUNTER — Encounter (HOSPITAL_COMMUNITY): Payer: Self-pay

## 2021-12-14 ENCOUNTER — Ambulatory Visit (HOSPITAL_COMMUNITY): Admit: 2021-12-14 | Payer: Medicare Other | Admitting: Gastroenterology

## 2021-12-14 SURGERY — ESOPHAGOGASTRODUODENOSCOPY (EGD) WITH PROPOFOL
Anesthesia: Monitor Anesthesia Care

## 2021-12-18 ENCOUNTER — Ambulatory Visit: Payer: Self-pay | Admitting: *Deleted

## 2021-12-18 DIAGNOSIS — B351 Tinea unguium: Secondary | ICD-10-CM | POA: Diagnosis not present

## 2021-12-18 DIAGNOSIS — M79676 Pain in unspecified toe(s): Secondary | ICD-10-CM | POA: Diagnosis not present

## 2021-12-18 DIAGNOSIS — L84 Corns and callosities: Secondary | ICD-10-CM | POA: Diagnosis not present

## 2021-12-18 DIAGNOSIS — E1142 Type 2 diabetes mellitus with diabetic polyneuropathy: Secondary | ICD-10-CM | POA: Diagnosis not present

## 2021-12-18 NOTE — Patient Outreach (Signed)
  Care Coordination   Follow Up Visit Note   12/18/2021 Name: Norma [name] MRN: [id number] DOB: Apr 25, 1950  Norma [name] is a 71 y.o. year old female who sees [name], Edwinna Areola, MD for primary care. I spoke with  Norma [name] by phone today.  What matters to the patients health and wellness today?  Nausea better, resolved She feels it was related to constipation but reports both her GI and Nephrology providers recommends a colonoscopy while her prefers is only an EGD Health maintenance-today she had a foot exam, eye exam completed per patient in April -may 2023. She does not prefer mammograms,  had covid x 2,  had pneumonia vaccine. States had DEXA scan  Need dental & vision resources  Tamala Julian [phone] VP Gene [id number] [phone]  Confirms no outreach from [employer], only pharmacy   Goals Addressed               This Visit's Progress     Patient [place] maintenance [facility]) (pt-stated)   Not on track     Care Coordination Interventions: Patient interviewed about adult health maintenance status including  Colonoscopy    Pneumonia Vaccine COVID vaccination    Regular eye checkups Regular Dental Care    Diabetes Eye Exam    Diabetes Foot Exam    Provided resource contact information for vision and dental. She voiced her coverage is does not cover much.  Provided the [facility] dental school number [phone] Provided [facility] VP Gene Pam Drown for the [employer]. She was encouraged to return a call to RN CM if a [facility] care guide referral is needed further  Discussed DEXA scans every 2 years Referral completed online to [place] ([place] [address] Club President- Edwin Gunn- Requesting assistance with glasses Sent Online education on Vaccines      nausea management [facility]) (pt-stated)   On track     Start date 11/14/21   Care Coordination Interventions: Confirm she attended follow up  visits for nausea. She reports she believes her nausea related to constipation as she resolved her constipation and reports nausea is better both her GI and Nephrology providers recommends a colonoscopy while her prefers is only an EGD. Pending another November 2023 Nephrology appointment. Discussed last colonoscopy noted in EPIC as 10/04/2016 She recalls it being done in December         SDOH assessments and interventions completed:  Yes  SDOH Interventions Today    Flowsheet Row Most Recent Value  SDOH Interventions   Food Insecurity Interventions Intervention Not Indicated  Transportation Interventions Intervention Not Indicated  Financial Strain Interventions Other (Comment)  [give numbers for the [employer] club and [employer] dental school]        Care Coordination Interventions Activated:  Yes  Care Coordination Interventions:  Yes, provided   Follow up plan: Follow up call scheduled for 03/01/22 2 pm    Encounter Outcome:  Pt. Visit Completed   Carless Slatten L. Lavina Hamman, RN, BSN, [place] Coordinator Office number [phone]

## 2021-12-18 NOTE — Patient Outreach (Signed)
  Care Coordination   12/18/2021 Name: Norma [name] MRN: [id number] DOB: 12/10/1950   Care Coordination Outreach Attempts:  Contact was made with the patient today to offer care coordination services as a benefit of their health plan. The patient requested a return call on a later date.  She is out eating lunch  Follow Up Plan:  Additional outreach attempts will be made to offer the patient care coordination information and services.   Encounter Outcome:  No Answer  Care Coordination Interventions Activated:  No   Care Coordination Interventions:  No, not indicated    SIG Shonnie Poudrier L. Lavina Hamman, RN, BSN, [place] Coordinator Office number [phone]

## 2021-12-18 NOTE — Patient Instructions (Addendum)
Visit Information  Thank you for taking time to visit with me today. Please don't hesitate to contact me if I can be of assistance to you.    Reduced NEED HELP Access to free for reduced cost dental care is not readily available. Public low cost dental clinics are  scarce in [place]. [employer] have two mobile dental units which are used by  churches and other organizations to sponsor free dental clinics. These mobile units are used about  180 days each year throughout [place]. These clinics are available to patients who do not have dental  insurance and cannot otherwise afford to see a dentist. The services provided are limited to  restorations (fillings) and extractions. The churches that sponsor these clinics determine their own  target patient group; not all clinics are open to the general public. Listed below are links to organizations that offer free or reduced cost dental care:  [place]: [place] - agapedentalministry.[place]: [facility] - WorkplaceHistory.com.br  [place]: [employer] - FindDrives.pl  [place] Locations: [place], [place],  [place], [place], [place], [place], [place] - [email or web address] Dental cleanings may be available at your local community college for a small fee. In the dental  hygiene program, patients are needed for student practice. To find out if there is a church or organization in your area sponsoring the [employer], email: [email or web address]. Be sure to include the city and county in  which you need services. Not all of the clinics are open to the general public. The sponsoring  church/organization enlists local dentists to volunteer aboard the mobile units. Please contact us if we can serve you and help you or your church get involved in missions. [address], [place], [place] [address], [place], [place] [phone]  Following are the goals we discussed today:   Goals Addressed               This Visit's Progress     Patient Stated     Health maintenance [facility]) (pt-stated)   Not on track     Care Coordination Interventions: Patient interviewed about adult health maintenance status including  Colonoscopy    Pneumonia Vaccine COVID vaccination    Regular eye checkups Regular Dental Care    Diabetes Eye Exam    Diabetes Foot Exam    Provided resource contact information for vision and dental. She voiced her coverage is does not cover much.  Provided the [facility] dental school number [phone] Provided [facility] VP Gene Pam Drown for the [employer]. She was encouraged to return a call to RN CM if a [facility] care guide referral is needed further  Discussed DEXA scans every 2 years Referral completed online to [place] ([place] [address] [employer] President- Edwin Gunn- Requesting assistance with glasses Sent Online education on Vaccines      nausea management [facility]) (pt-stated)   On track     Start date 11/14/21   Care Coordination Interventions: Confirm she attended follow up visits for nausea. She reports she believes her nausea related to constipation as she resolved her constipation and reports nausea is better both her GI and Nephrology providers recommends a colonoscopy while her prefers is only an EGD. Pending  another November 2023 Nephrology appointment. Discussed last colonoscopy noted in EPIC as 10/04/2016 She recalls it being done in December         Our next appointment is by telephone on 03/01/22 at 2 pm  Please call the care guide team at [phone] if you need to cancel or reschedule your appointment.   If you are experiencing a Mental Health or [place] or need someone to talk to, please  call the Suicide and Crisis Lifeline: 988 call the [place] National Suicide Prevention Lifeline: 206-454-5014 or TTY: 684-877-8708 TTY 6841769882) to talk to a trained counselor call 1-800-273-TALK (toll free, 24 hour hotline) call the [facility]): [phone] call 911   Patient verbalizes understanding of instructions and care plan provided today and agrees to view in [place]. Active MyChart status and patient understanding of how to access instructions and care plan via MyChart confirmed with patient.     The patient has been provided with contact information for the care management team and has been advised to call with any health related questions or concerns.   [place] Lavina Hamman, RN, BSN, [place] Coordinator Office number [phone]

## 2021-12-19 ENCOUNTER — Encounter: Payer: Medicare Other | Admitting: *Deleted

## 2021-12-25 DIAGNOSIS — E611 Iron deficiency: Secondary | ICD-10-CM | POA: Insufficient documentation

## 2021-12-26 DIAGNOSIS — E611 Iron deficiency: Secondary | ICD-10-CM | POA: Diagnosis not present

## 2022-01-02 DIAGNOSIS — E611 Iron deficiency: Secondary | ICD-10-CM | POA: Diagnosis not present

## 2022-01-03 DIAGNOSIS — E039 Hypothyroidism, unspecified: Secondary | ICD-10-CM | POA: Diagnosis not present

## 2022-01-03 DIAGNOSIS — I2510 Atherosclerotic heart disease of native coronary artery without angina pectoris: Secondary | ICD-10-CM | POA: Diagnosis not present

## 2022-01-03 DIAGNOSIS — E1121 Type 2 diabetes mellitus with diabetic nephropathy: Secondary | ICD-10-CM | POA: Diagnosis not present

## 2022-01-07 DIAGNOSIS — E782 Mixed hyperlipidemia: Secondary | ICD-10-CM | POA: Diagnosis not present

## 2022-01-07 DIAGNOSIS — E1121 Type 2 diabetes mellitus with diabetic nephropathy: Secondary | ICD-10-CM | POA: Diagnosis not present

## 2022-01-07 DIAGNOSIS — I2510 Atherosclerotic heart disease of native coronary artery without angina pectoris: Secondary | ICD-10-CM | POA: Diagnosis not present

## 2022-01-07 DIAGNOSIS — N1832 Chronic kidney disease, stage 3b: Secondary | ICD-10-CM | POA: Diagnosis not present

## 2022-01-07 DIAGNOSIS — M545 Low back pain, unspecified: Secondary | ICD-10-CM | POA: Diagnosis not present

## 2022-01-07 DIAGNOSIS — E039 Hypothyroidism, unspecified: Secondary | ICD-10-CM | POA: Diagnosis not present

## 2022-01-07 DIAGNOSIS — E1140 Type 2 diabetes mellitus with diabetic neuropathy, unspecified: Secondary | ICD-10-CM | POA: Diagnosis not present

## 2022-01-07 DIAGNOSIS — I5032 Chronic diastolic (congestive) heart failure: Secondary | ICD-10-CM | POA: Diagnosis not present

## 2022-01-07 DIAGNOSIS — D509 Iron deficiency anemia, unspecified: Secondary | ICD-10-CM | POA: Diagnosis not present

## 2022-01-07 DIAGNOSIS — F3181 Bipolar II disorder: Secondary | ICD-10-CM | POA: Diagnosis not present

## 2022-01-07 DIAGNOSIS — Z8673 Personal history of transient ischemic attack (TIA), and cerebral infarction without residual deficits: Secondary | ICD-10-CM | POA: Diagnosis not present

## 2022-01-07 DIAGNOSIS — G4733 Obstructive sleep apnea (adult) (pediatric): Secondary | ICD-10-CM | POA: Diagnosis not present

## 2022-01-10 ENCOUNTER — Other Ambulatory Visit: Payer: Self-pay

## 2022-01-12 DIAGNOSIS — I130 Hypertensive heart and chronic kidney disease with heart failure and stage 1 through stage 4 chronic kidney disease, or unspecified chronic kidney disease: Secondary | ICD-10-CM | POA: Diagnosis not present

## 2022-01-12 DIAGNOSIS — I5032 Chronic diastolic (congestive) heart failure: Secondary | ICD-10-CM | POA: Diagnosis not present

## 2022-01-12 DIAGNOSIS — E119 Type 2 diabetes mellitus without complications: Secondary | ICD-10-CM | POA: Diagnosis not present

## 2022-01-12 DIAGNOSIS — I10 Essential (primary) hypertension: Secondary | ICD-10-CM | POA: Diagnosis not present

## 2022-01-12 DIAGNOSIS — E1121 Type 2 diabetes mellitus with diabetic nephropathy: Secondary | ICD-10-CM | POA: Diagnosis not present

## 2022-01-12 DIAGNOSIS — N1832 Chronic kidney disease, stage 3b: Secondary | ICD-10-CM | POA: Diagnosis not present

## 2022-01-13 ENCOUNTER — Other Ambulatory Visit: Payer: Self-pay

## 2022-01-13 ENCOUNTER — Emergency Department (HOSPITAL_COMMUNITY)
Admission: EM | Admit: 2022-01-13 | Discharge: 2022-01-13 | Disposition: A | Discharge status: Home / Self Care | Payer: Medicare Other | Attending: Emergency Medicine | Admitting: Emergency Medicine

## 2022-01-13 ENCOUNTER — Encounter (HOSPITAL_COMMUNITY): Payer: Self-pay

## 2022-01-13 DIAGNOSIS — M545 Low back pain, unspecified: Secondary | ICD-10-CM | POA: Diagnosis present

## 2022-01-13 DIAGNOSIS — E119 Type 2 diabetes mellitus without complications: Secondary | ICD-10-CM | POA: Insufficient documentation

## 2022-01-13 DIAGNOSIS — M5441 Lumbago with sciatica, right side: Secondary | ICD-10-CM | POA: Insufficient documentation

## 2022-01-13 DIAGNOSIS — I10 Essential (primary) hypertension: Secondary | ICD-10-CM | POA: Insufficient documentation

## 2022-01-13 DIAGNOSIS — Z7982 Long term (current) use of aspirin: Secondary | ICD-10-CM | POA: Diagnosis not present

## 2022-01-13 DIAGNOSIS — R52 Pain, unspecified: Secondary | ICD-10-CM | POA: Diagnosis not present

## 2022-01-13 DIAGNOSIS — M5442 Lumbago with sciatica, left side: Secondary | ICD-10-CM | POA: Diagnosis not present

## 2022-01-13 DIAGNOSIS — M79651 Pain in right thigh: Secondary | ICD-10-CM | POA: Insufficient documentation

## 2022-01-13 DIAGNOSIS — M79606 Pain in leg, unspecified: Secondary | ICD-10-CM | POA: Diagnosis not present

## 2022-01-13 DIAGNOSIS — Z7984 Long term (current) use of oral hypoglycemic drugs: Secondary | ICD-10-CM | POA: Diagnosis not present

## 2022-01-13 DIAGNOSIS — Z79899 Other long term (current) drug therapy: Secondary | ICD-10-CM | POA: Insufficient documentation

## 2022-01-13 DIAGNOSIS — M5432 Sciatica, left side: Secondary | ICD-10-CM

## 2022-01-13 MED ORDER — HYDROMORPHONE HCL 1 MG/ML IJ SOLN
1.0000 mg | Freq: Once | INTRAMUSCULAR | Status: AC
  Administered 2022-01-13: 1 mg via INTRAVENOUS
  Filled 2022-01-13: qty 1

## 2022-01-13 MED ORDER — METHYLPREDNISOLONE 4 MG PO TBPK: ORAL_TABLET | ORAL | 0 refills | Status: DC

## 2022-01-13 MED ORDER — KETOROLAC TROMETHAMINE 30 MG/ML IJ SOLN
30.0000 mg | Freq: Once | INTRAMUSCULAR | Status: AC
Start: 2022-01-13 — End: 2022-01-13
  Administered 2022-01-13: 30 mg via INTRAVENOUS
  Filled 2022-01-13: qty 1

## 2022-01-13 MED ORDER — TRAMADOL HCL 50 MG PO TABS: 50.0000 mg | ORAL_TABLET | Freq: Four times a day (QID) | ORAL | 0 refills | Status: DC | PRN

## 2022-01-13 MED ORDER — METHYLPREDNISOLONE SODIUM SUCC 125 MG IJ SOLR
125.0000 mg | Freq: Once | INTRAMUSCULAR | Status: AC
  Administered 2022-01-13: 125 mg via INTRAVENOUS
  Filled 2022-01-13: qty 2

## 2022-01-13 NOTE — Discharge Instructions (Signed)
Begin taking the Medrol Dosepak as prescribed.  Begin taking tramadol as prescribed as needed for pain.  Follow-up with your primary doctor if symptoms are not improving in the next 3 to 4 days.

## 2022-01-13 NOTE — ED Triage Notes (Signed)
Pt arrived from home via [facility] c/o left side sciatic pain. Pt reports she was at home sitting at her computer desk when pain began. Pt reports trying to get up and move around but the pain worsened and she was unable to move any further.

## 2022-01-13 NOTE — ED Provider Notes (Signed)
[facility] EMERGENCY DEPARTMENT Provider Note   CSN: [id number] Arrival date & time: 01/13/22  0240     History  Chief Complaint  Patient presents with   Sciatica    Norma [name] is a 71 y.o. female.  Patient is a 71 year old female with past medical history of hyperlipidemia, type 2 diabetes, sleep apnea, hypertension, obesity.  Patient presenting today with complaints of left low back and buttock pain.  This started last night in the absence of any injury or trauma.  She attempted to get up and walk to the bathroom, however she had so much pain she could not ambulate.  She denies any weakness or numbness of the leg.  She denies any bowel or bladder complaints.  This feels similar to sciatica that she has had on the right side, but has never had it on the left.  The history is provided by the patient.       Home Medications Prior to Admission medications   Medication Sig Start Date End Date Taking? Authorizing Provider  ALPRAZolam (XANAX) 0.25 MG tablet Take 1 tablet (0.25 mg total) by mouth at bedtime as needed for anxiety or sleep. 11/26/21   Cloria Spring, MD  ARIPiprazole (ABILIFY) 2 MG tablet Take 1 tablet (2 mg total) by mouth daily. 11/26/21   Cloria Spring, MD  aspirin EC 81 MG tablet Take 1 tablet (81 mg total) by mouth daily. 01/20/18   Arnoldo Lenis, MD  buPROPion (WELLBUTRIN XL) 150 MG 24 hr tablet Take 1 tablet (150 mg total) by mouth every morning. 11/26/21   Cloria Spring, MD  carvedilol (COREG) 6.25 MG tablet Take 1 tablet (6.25 mg total) by mouth 2 (two) times daily with a meal. 12/13/21   Branch, Alphonse Guild, MD  cetirizine (ZYRTEC) 10 MG tablet Take 1 tablet tablet 1-2 times daily. Patient taking differently: Take 10 mg by mouth at bedtime. 04/25/19   Valentina Shaggy, MD  diclofenac Sodium (VOLTAREN) 1 % GEL Apply 2 g topically 4 (four) times daily as needed (pain).    [provider]  diphenhydrAMINE (BENADRYL) 25 mg capsule Take 25 mg by  mouth as needed for allergies or itching.    [provider]  empagliflozin (JARDIANCE) 10 MG TABS tablet Take 10 mg by mouth daily.    [provider]  EPINEPHrine 0.3 mg/0.3 mL IJ SOAJ injection Inject 0.3 mg into the muscle once.    [provider]  ferrous sulfate 325 (65 FE) MG EC tablet Take 325 mg by mouth 3 (three) times daily with meals.    [provider]  fluticasone (FLONASE) 50 MCG/ACT nasal spray Place 1 spray into both nostrils daily. 09/25/21   [provider]  glipiZIDE (GLUCOTROL XL) 5 MG 24 hr tablet SMARTSIG:1 Tablet(s) By Mouth Morning-Evening 11/16/21   [provider]  lamoTRIgine (LAMICTAL) 150 MG tablet Take 1 tablet (150 mg total) by mouth at bedtime. 11/26/21   Cloria Spring, MD  levothyroxine (SYNTHROID) 112 MCG tablet Take 112 mcg by mouth daily before breakfast.    [provider]  losartan (COZAAR) 50 MG tablet Take 50 mg by mouth every morning. 11/16/21   [provider]  meclizine (ANTIVERT) 25 MG tablet Take 1 tablet (25 mg total) by mouth 3 (three) times daily as needed for dizziness. Patient taking differently: Take 25 mg by mouth daily as needed for dizziness. 03/29/14   Virgel Manifold, MD  metFORMIN (GLUCOPHAGE) 500 MG tablet  TAKE ONE TABLET BY MOUTH TWICE DAILY WITH A MEAL (MORNING ,EVENING) Patient taking differently: Take 500 mg by mouth 2 (two) times daily with a meal. 10/14/19   Nida, Marella Chimes, MD  ondansetron (ZOFRAN) 4 MG tablet Take 1 tablet (4 mg total) by mouth every 8 (eight) hours as needed for nausea or vomiting. 01/18/21   Rehman, Mechele Dawley, MD  OVER THE COUNTER MEDICATION Take 1 tablet by mouth daily as needed (pain). CBD Gummies    [provider]  pantoprazole (PROTONIX) 40 MG tablet Take 1 tablet (40 mg total) by mouth daily before supper. 05/22/21   Rehman, Mechele Dawley, MD  REPATHA SURECLICK 542 MG/ML SOAJ INJECT ONE DOSE INTO SKIN EVERY 14 DAYS. Patient taking  differently: Inject 140 mg into the skin every 14 (fourteen) days. 05/14/21   Hilty, Nadean Corwin, MD  rOPINIRole (REQUIP) 3 MG tablet TAKE ONE TABLET BY MOUTH EVERY MORNING, ,NOON AND AT BEDTIME. (PRT PT MORNING,EVENING,BEDTIME) Patient taking differently: Take 3 mg by mouth 3 (three) times daily. 01/11/21   Kathrynn Ducking, MD  traMADol (ULTRAM) 50 MG tablet Take 2 tablets (100 mg total) by mouth every 6 (six) hours as needed for moderate pain or severe pain. 07/17/21   Marvis Moeller, NP  venlafaxine XR (EFFEXOR-XR) 150 MG 24 hr capsule Take 2 capsules (300 mg total) by mouth at bedtime. 11/26/21   Cloria Spring, MD  Vitamin D, Ergocalciferol, (DRISDOL) 1.25 MG (50000 UNIT) CAPS capsule Take 50,000 Units by mouth every 7 (seven) days. Sunday    [provider]      Allergies    Amoxicillin, Hydrocodone, Percocet [oxycodone-acetaminophen], Vancomycin, Acetaminophen, Depacon [valproate sodium], Depacon [valproic acid], Iron, Oxycodone, Oxycodone hcl, and Prednisone    Review of Systems   Review of Systems  All other systems reviewed and are negative.   Physical Exam Updated Vital Signs BP 129/81 (BP Location: Left Arm)   Pulse 93   Temp 98.2 F (36.8 C) (Oral)   Resp 16   Ht 5' (1.524 m)   Wt 108.9 kg   SpO2 96%   BMI 46.87 kg/m  Physical Exam Vitals and nursing note reviewed.  Constitutional:      General: She is not in acute distress.    Appearance: Normal appearance. She is not ill-appearing.  HENT:     Head: Normocephalic and atraumatic.  Pulmonary:     Effort: Pulmonary effort is normal.  Musculoskeletal:     Comments: There is tenderness to palpation in the soft tissues of the left buttock and left lumbar region.  There is no bony tenderness or step-off.  Strength is 5 out of 5 in the left lower extremity.  DP pulses are easily palpable.  Skin:    General: Skin is warm and dry.  Neurological:     Mental Status: She is alert.     ED Results / Procedures /  Treatments   Labs (all labs ordered are listed, but only abnormal results are displayed) Labs Reviewed - No data to display  EKG None  Radiology No results found.  Procedures Procedures    Medications Ordered in ED Medications  HYDROmorphone (DILAUDID) injection 1 mg (has no administration in time range)  ketorolac (TORADOL) 30 MG/ML injection 30 mg (has no administration in time range)  methylPREDNISolone sodium succinate (SOLU-MEDROL) 125 mg/2 mL injection 125 mg (has no administration in time range)    ED Course/ Medical Decision Making/ A&P  Patient is a 71 year old female  presenting with complaints of left back and buttock pain starting yesterday evening.  She describes severe pain that is worse when she attempts to ambulate.  She has had sciatica on the right, but never the left and this feels similar.  Patient's left lower extremity is neurovascularly intact and there are no bowel or bladder issues that would suggest an emergent situation.  She has received Solu-Medrol and Dilaudid and is feeling significantly improved.  I feel as though she can safely be discharged with steroids, pain medication, and follow-up with primary doctor if not improving.  Final Clinical Impression(s) / ED Diagnoses Final diagnoses:  None    Rx / DC Orders ED Discharge Orders     None         Veryl Speak, MD 01/13/22 [phone]

## 2022-01-18 ENCOUNTER — Telehealth: Payer: Self-pay

## 2022-01-18 DIAGNOSIS — L03031 Cellulitis of right toe: Secondary | ICD-10-CM | POA: Diagnosis not present

## 2022-01-18 DIAGNOSIS — M545 Low back pain, unspecified: Secondary | ICD-10-CM | POA: Diagnosis not present

## 2022-01-18 DIAGNOSIS — S90822A Blister (nonthermal), left foot, initial encounter: Secondary | ICD-10-CM | POA: Diagnosis not present

## 2022-01-18 NOTE — Telephone Encounter (Signed)
        Patient  visited Dorita Fray on 10/1     Telephone encounter attempt :  1ST  A HIPAA compliant voice message was left requesting a return call.  Instructed patient to call back   [place], [place] Management  [phone] [address], [place], [place] [address] Phone: [phone] Email: [email or web address]

## 2022-01-21 ENCOUNTER — Telehealth (INDEPENDENT_AMBULATORY_CARE_PROVIDER_SITE_OTHER): Payer: Medicare Other | Admitting: Psychiatry

## 2022-01-21 ENCOUNTER — Encounter (HOSPITAL_COMMUNITY): Payer: Self-pay | Admitting: Psychiatry

## 2022-01-21 DIAGNOSIS — F3131 Bipolar disorder, current episode depressed, mild: Secondary | ICD-10-CM

## 2022-01-21 MED ORDER — VENLAFAXINE HCL ER 150 MG PO CP24: 300.0000 mg | ORAL_CAPSULE | Freq: Every day | ORAL | 2 refills | Status: DC

## 2022-01-21 MED ORDER — BUPROPION HCL ER (XL) 150 MG PO TB24: 150.0000 mg | ORAL_TABLET | Freq: Every morning | ORAL | 2 refills | Status: DC

## 2022-01-21 MED ORDER — ARIPIPRAZOLE 2 MG PO TABS: 2.0000 mg | ORAL_TABLET | Freq: Every day | ORAL | 2 refills | Status: DC

## 2022-01-21 MED ORDER — ALPRAZOLAM 0.25 MG PO TABS: 0.2500 mg | ORAL_TABLET | Freq: Every evening | ORAL | 2 refills | Status: DC | PRN

## 2022-01-21 MED ORDER — LAMOTRIGINE 150 MG PO TABS: 150.0000 mg | ORAL_TABLET | Freq: Every day | ORAL | 2 refills | Status: DC

## 2022-01-21 NOTE — Progress Notes (Signed)
Virtual Visit via Telephone Note  I connected with Norma [name] on 01/21/22 at 11:20 AM EDT by telephone and verified that I am speaking with the correct person using two identifiers.  Location: Patient: home Provider: office   I discussed the limitations, risks, security and privacy concerns of performing an evaluation and management service by telephone and the availability of in person appointments. I also discussed with the patient that there may be a patient responsible charge related to this service. The patient expressed understanding and agreed to proceed.      I discussed the assessment and treatment plan with the patient. The patient was provided an opportunity to ask questions and all were answered. The patient agreed with the plan and demonstrated an understanding of the instructions.   The patient was advised to call back or seek an in-person evaluation if the symptoms worsen or if the condition fails to improve as anticipated.  I provided 15 minutes of non-face-to-face time during this encounter.   [name], MD  [facility] MD/PA/NP OP Progress Note  01/21/2022 11:50 AM Norma [name]  MRN:  [id number]  Chief Complaint:  Chief Complaint  Patient presents with   Depression   Anxiety   Follow-up   HPI:  This patient is a 71 year old single white female who lives alone in [place].  She is a Equities trader.  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  For the most part she has been doing better.  She had a bad attack of sciatica over the weekend and was treated in the emergency room.  However for the most part she is walking better since her back surgery last year.  She only uses the walker sporadically.  She is sleeping well and her energy is fair.  She is enjoying her job.  Last time we added Abilify to her regimen she thinks is helped her mood considerably.  She denies  significant depression anxiety or manic symptoms. Visit Diagnosis:    ICD-10-CM   1. Bipolar affective disorder, currently depressed, mild ([place])  F31.31       Past Psychiatric History: She was hospitalized in 2001 and went through a course of ECT.  She has been tried on [employer] Wellbutrin and Zyprexa  Past Medical History:  Past Medical History:  Diagnosis Date   Anemia    Anxiety    Bipolar disorder (HCC)    Bulging lumbar disc    L3-4   Chronic daily headache    Chronic low back pain 09/20/2014   CKD (chronic kidney disease)    stage 3   COPD (chronic obstructive pulmonary disease) (HCC)    Coronary artery disease    CABG 2019   Degenerative arthritis    Depression    Diabetes mellitus, type II (HCC)    Diabetic neuropathy ([place]) 09/16/2018   DM type 2 with diabetic peripheral neuropathy ([place]) 09/20/2014   Dyslipidemia    Dyspnea    Family history of adverse reaction to anesthesia    father had reaction to anesthesia medication per patient "they don't use it anymore"- unsure of reaction   Fatty liver    per pt report   Gastroparesis    GERD (gastroesophageal reflux disease)    Heart murmur    History of hiatal hernia    pt states she no longer has this   Hypothyroidism    IBS (irritable  bowel syndrome)    LBBB (left bundle branch block)    Memory difficulty 09/20/2014   Morbid obesity ([place])    Myocardial infarction ([place])    per pt- prior to CABG   Neuropathy    Obstructive sleep apnea on CPAP    Restless legs syndrome (RLS) 09/17/2012   Stroke (cerebrum) ([place]) 05/16/2017   Left parietal   Urticaria     Past Surgical History:  Procedure Laterality Date   ABDOMINAL HYSTERECTOMY     ARTHROSCOPY KNEE W/ DRILLING  06/2011   and Dece[place] of 2013.   Carpel tunnel  1980's   CATARACT EXTRACTION W/PHACO Right 03/21/2017   Procedure: CATARACT EXTRACTION PHACO AND INTRAOCULAR LENS PLACEMENT RIGHT EYE;  Surgeon: Baruch Goldmann, MD;  Location: [facility];  Service:  Ophthalmology;  Laterality: Right;  CDE: 2.91    CATARACT EXTRACTION W/PHACO Left 04/18/2017   Procedure: CATARACT EXTRACTION PHACO AND INTRAOCULAR LENS PLACEMENT LEFT EYE;  Surgeon: Baruch Goldmann, MD;  Location: [facility];  Service: Ophthalmology;  Laterality: Left;  left   CHOLECYSTECTOMY     COLONOSCOPY WITH PROPOFOL N/A 10/04/2016   Procedure: COLONOSCOPY WITH PROPOFOL;  Surgeon: Rogene Houston, MD;  [facility];  Service: Endoscopy;  Laterality: N/A;  11:10   CORONARY ARTERY BYPASS GRAFT N/A 08/29/2017   Procedure: CORONARY ARTERY BYPASS GRAFTING (CABG) x 3 WITH ENDOSCOPIC HARVESTING OF RIGHT SAPHENOUS VEIN;  Surgeon: Ivin Poot, MD;  Location: [place];  Service: Open Heart Surgery;  Laterality: N/A;   ESOPHAGOGASTRODUODENOSCOPY N/A 04/25/2016   Procedure: ESOPHAGOGASTRODUODENOSCOPY (EGD);  Surgeon: Rogene Houston, MD;  [facility];  Service: Endoscopy;  Laterality: N/A;  730   LEFT HEART CATH AND CORONARY ANGIOGRAPHY N/A 08/27/2017   Procedure: LEFT HEART CATH AND CORONARY ANGIOGRAPHY;  Surgeon: Sherren Mocha, MD;  Location: [place] [facility];  Service: Cardiovascular::  pLAD 95% - p-mLAD 50%, ostD1 90% -pD1 80%; ostOM1 90%; rPDA 80%. EF ~50-55% - HK of dital Anterolateral & Apical wall.  - Rec CVTS c/s   NECK SURGERY     pt reports having growth removed from the back of her neck- after 2021   OPEN REDUCTION INTERNAL FIXATION (ORIF) HAND Left 04/10/2020   Procedure: OPEN REDUCTION INTERNAL FIXATION (ORIF) HAND, left index finger;  Surgeon: Carole Civil, MD;  Location: [facility];  Service: Orthopedics;  Laterality: Left;  0.045 k wires   RECTAL SURGERY     fissure   SHOULDER SURGERY Left    arthroscopy in March of this year   TEE WITHOUT CARDIOVERSION N/A 08/29/2017   Procedure: TRANSESOPHAGEAL ECHOCARDIOGRAM (TEE);  Surgeon: Prescott Gum, Collier Salina, MD;  Location: [facility];  Service: Open Heart Surgery;  Laterality: N/A;   TRANSFORAMINAL LUMBAR INTERBODY FUSION  W/ MIS 1 LEVEL Right 07/12/2021   Procedure: Minimally Invasive Surgery Transforaminal Lumbar Interbody Fusion  Lumbar four-five;  Surgeon: Vallarie Mare, MD;  Location: [facility];  Service: Neurosurgery;  Laterality: Right;   TRANSTHORACIC ECHOCARDIOGRAM  06/06/2017   Mild to moderate reduced EF 40 and 45%.  Anterior septal, inferoseptal and basal to mid inferior hypokinesis.  GR 1 DD.  No significant valvular lesion    Family Psychiatric History: See below  Family History:  Family History  Problem Relation Age of Onset   Hypertension Mother    Lymphoma Mother    Depression Mother    Arthritis Father    Alcohol abuse Father    Hypertension Sister    Cancer Brother  kidney and lung   Alcohol abuse Brother    Alcohol abuse Paternal Uncle    Alcohol abuse Paternal Grandfather    Alcohol abuse Paternal Grandmother    Allergic rhinitis Neg Hx    Angioedema Neg Hx    Asthma Neg Hx    Atopy Neg Hx    Eczema Neg Hx    Immunodeficiency Neg Hx    Urticaria Neg Hx     Social History:  Social History   Socioeconomic History   Marital status: Single    Spouse name: Not on file   Number of children: 0   Years of education: 16   Highest education level: Not on file  Occupational History    Employer: [employer]  Tobacco Use   Smoking status: Former    Types: Cigarettes    Quit date: 02/04/1971    Years since quitting: 50.9   Smokeless tobacco: Never   Tobacco comments:    smoked 2 cigarettes a day  Vaping Use   Vaping Use: Never used  Substance and Sexual Activity   Alcohol use: No    Alcohol/week: 0.0 standard drinks of alcohol   Drug use: No   Sexual activity: Never  Other Topics Concern   Not on file  Social History Narrative   Patient lives at home alone.    Patient has no children.    Patient has her masters in nursing.    Patient is single.    Patient drinks about 2 glasses of tea daily.   Patient is right handed.   Social Determinants of  Health   Financial Resource Strain: Medium Risk (12/18/2021)   Overall Financial Resource Strain (CARDIA)    Difficulty of Paying Living Expenses: Somewhat hard  Food Insecurity: No Food Insecurity (12/18/2021)   Hunger Vital Sign    Worried About Running Out of Food in the Last Year: Never true    Ran Out of Food in the Last Year: Never true  Transportation Needs: No Transportation Needs (12/18/2021)   PRAPARE - Hydrologist (Medical): No    Lack of Transportation (Non-Medical): No  Physical Activity: Not on file  Stress: No Stress Concern Present (10/04/2021)   [place]    Feeling of Stress : Only a little  Social Connections: Not on file    Allergies:  Allergies  Allergen Reactions   Amoxicillin Anaphylaxis and Other (See Comments)    Has patient had a PCN reaction causing immediate rash, facial/tongue/throat swelling, SOB or lightheadedness with hypotension: Yes Has patient had a PCN reaction causing severe rash involving mucus membranes or skin necrosis: Yes Has patient had a PCN reaction that required hospitalization: Yes Has patient had a PCN reaction occurring within the last 10 years: Yes If all of the above answers are "NO", then may proceed with Cephalosporin use.    Hydrocodone Anaphylaxis   Percocet [Oxycodone-Acetaminophen] Other (See Comments)    Causes chest pain /tightness. Pressure pain around her ribs.   Vancomycin Itching   Acetaminophen    Depacon [Valproate Sodium]    Depacon [Valproic Acid] Other (See Comments)    Causes falls    Iron Nausea And Vomiting   Oxycodone    Oxycodone Hcl    Prednisone Itching    Metabolic Disorder Labs: Lab Results  Component Value Date   HGBA1C 7.4 (H) 07/05/2021   MPG 165.68 07/05/2021   MPG 148 03/05/2019   No  results found for: "PROLACTIN" Lab Results  Component Value Date   CHOL 129 11/08/2019   TRIG 158 11/08/2019    HDL 60 11/08/2019   CHOLHDL 3.8 03/05/2019   VLDL 31 (H) 07/10/2016   LDLCALC 43 11/08/2019   LDLCALC 152 (H) 03/05/2019   Lab Results  Component Value Date   TSH 1.49 03/05/2019   TSH 2.671 10/30/2018    Therapeutic Level Labs: No results found for: "LITHIUM" No results found for: "VALPROATE" No results found for: "CBMZ"  Current Medications: Current Outpatient Medications  Medication Sig Dispense Refill   ALPRAZolam (XANAX) 0.25 MG tablet Take 1 tablet (0.25 mg total) by mouth at bedtime as needed for anxiety or sleep. 30 tablet 2   ARIPiprazole (ABILIFY) 2 MG tablet Take 1 tablet (2 mg total) by mouth daily. 30 tablet 2   aspirin EC 81 MG tablet Take 1 tablet (81 mg total) by mouth daily. 90 tablet 3   buPROPion (WELLBUTRIN XL) 150 MG 24 hr tablet Take 1 tablet (150 mg total) by mouth every morning. 30 tablet 2   carvedilol (COREG) 6.25 MG tablet Take 1 tablet (6.25 mg total) by mouth 2 (two) times daily with a meal. 180 tablet 2   cetirizine (ZYRTEC) 10 MG tablet Take 1 tablet tablet 1-2 times daily. (Patient taking differently: Take 10 mg by mouth at bedtime.) 60 tablet 5   diclofenac Sodium (VOLTAREN) 1 % GEL Apply 2 g topically 4 (four) times daily as needed (pain).     diphenhydrAMINE (BENADRYL) 25 mg capsule Take 25 mg by mouth as needed for allergies or itching.     empagliflozin (JARDIANCE) 10 MG TABS tablet Take 10 mg by mouth daily.     EPINEPHrine 0.3 mg/0.3 mL IJ SOAJ injection Inject 0.3 mg into the muscle once.     ferrous sulfate 325 (65 FE) MG EC tablet Take 325 mg by mouth 3 (three) times daily with meals.     fluticasone (FLONASE) 50 MCG/ACT nasal spray Place 1 spray into both nostrils daily.     glipiZIDE (GLUCOTROL XL) 5 MG 24 hr tablet SMARTSIG:1 Tablet(s) By Mouth Morning-Evening     lamoTRIgine (LAMICTAL) 150 MG tablet Take 1 tablet (150 mg total) by mouth at bedtime. 30 tablet 2   levothyroxine (SYNTHROID) 112 MCG tablet Take 112 mcg by mouth daily before  breakfast.     losartan (COZAAR) 50 MG tablet Take 50 mg by mouth every morning.     meclizine (ANTIVERT) 25 MG tablet Take 1 tablet (25 mg total) by mouth 3 (three) times daily as needed for dizziness. (Patient taking differently: Take 25 mg by mouth daily as needed for dizziness.) 30 tablet 0   metFORMIN (GLUCOPHAGE) 500 MG tablet TAKE ONE TABLET BY MOUTH TWICE DAILY WITH A MEAL (MORNING ,EVENING) (Patient taking differently: Take 500 mg by mouth 2 (two) times daily with a meal.) 180 tablet 0   methylPREDNISolone (MEDROL DOSEPAK) 4 MG TBPK tablet Take 6 tablets day 1, then 5 tablets day 2, then 4 tablets day 3, then 3 tablets day 4, then 2 tablets day 5, then 1 tablet day 6. 21 tablet 0   ondansetron (ZOFRAN) 4 MG tablet Take 1 tablet (4 mg total) by mouth every 8 (eight) hours as needed for nausea or vomiting. 20 tablet 0   OVER THE COUNTER MEDICATION Take 1 tablet by mouth daily as needed (pain). CBD Gummies     pantoprazole (PROTONIX) 40 MG tablet Take 1 tablet (40 mg  total) by mouth daily before supper. 90 tablet 3   REPATHA SURECLICK 676 MG/ML SOAJ INJECT ONE DOSE INTO SKIN EVERY 14 DAYS. (Patient taking differently: Inject 140 mg into the skin every 14 (fourteen) days.) 2 mL 11   rOPINIRole (REQUIP) 3 MG tablet TAKE ONE TABLET BY MOUTH EVERY MORNING, ,NOON AND AT BEDTIME. (PRT PT MORNING,EVENING,BEDTIME) (Patient taking differently: Take 3 mg by mouth 3 (three) times daily.) 270 tablet 3   traMADol (ULTRAM) 50 MG tablet Take 1 tablet (50 mg total) by mouth every 6 (six) hours as needed. 15 tablet 0   venlafaxine XR (EFFEXOR-XR) 150 MG 24 hr capsule Take 2 capsules (300 mg total) by mouth at bedtime. 60 capsule 2   Vitamin D, Ergocalciferol, (DRISDOL) 1.25 MG (50000 UNIT) CAPS capsule Take 50,000 Units by mouth every 7 (seven) days. Sunday     No current facility-administered medications for this visit.     Musculoskeletal: Strength & Muscle Tone: within normal limits Gait & Station:  normal Patient leans: N/A  Psychiatric Specialty Exam: Review of Systems  Musculoskeletal:  Positive for arthralgias and back pain.  Neurological:  Positive for numbness.  All other systems reviewed and are negative.   There were no vitals taken for this visit.There is no height or weight on file to calculate BMI.  General Appearance: NA  Eye Contact:  NA  Speech:  Clear and Coherent  Volume:  Normal  Mood:  Euthymic  Affect:  NA  Thought Process:  Goal Directed  Orientation:  Full (Time, Place, and Person)  Thought Content: WDL   Suicidal Thoughts:  No  Homicidal Thoughts:  No  Memory:  Immediate;   Good Recent;   Good Remote;   Fair  Judgement:  Good  Insight:  Good  Psychomotor Activity:  Decreased  Concentration:  Concentration: Good and Attention Span: Good  Recall:  Good  Fund of Knowledge: Good  Language: Good  Akathisia:  No  Handed:  Right  AIMS (if indicated): not done  Assets:  Communication Skills Desire for Improvement Resilience Social Support Talents/Skills  ADL's:  Intact  Cognition: WNL  Sleep:  Good   Screenings: Mini-Mental    Flowsheet Row Office Visit from 02/18/2018 in [facility] Office Visit from 09/17/2017 in [facility]  Total Score (max 30 points ) 28 28      PHQ2-9    Flowsheet Row Video Visit from 01/21/2022 in [facility] Video Visit from 11/26/2021 in [facility] from 11/14/2021 in [facility]reach Telephone from 10/04/2021 in [facility] Video Visit from 08/30/2021 in [facility]  PHQ-2 Total Score '1 4 2 1 '$ 0  PHQ-9 Total Score -- 9 11 -- --      Flowsheet Row Video Visit from 01/21/2022 in [facility] from 01/13/2022  in [facility] Video Visit from 08/30/2021 in [facility] No Risk No Risk No Risk        Assessment and Plan: This patient is a 71 year old female with a history of depression anxiety mood swings and insomnia.  She is doing better with the addition of Abilify 2 mg for augmentation.  She will continue this as well as Wellbutrin XL 150 mg daily as well as Effexor XR 300 mg daily for depression, Lamictal 100 mg daily for mood stabilization  and Xanax 0.25 mg at bedtime as needed for anxiety and sleep.  She will return to see me in 3 months  Collaboration of Care: Collaboration of Care: Primary Care Provider AEB notes will be shared with PCP at patient's request  Patient/Guardian was advised Release of Information must be obtained prior to any record release in order to collaborate their care with an outside provider. Patient/Guardian was advised if they have not already done so to contact the registration department to sign all necessary forms in order for [place] to release information regarding their care.   Consent: Patient/Guardian gives verbal consent for treatment and assignment of benefits for services provided during this visit. Patient/Guardian expressed understanding and agreed to proceed.    [name], MD 01/21/2022, 11:50 AM

## 2022-01-22 DIAGNOSIS — L97521 Non-pressure chronic ulcer of other part of left foot limited to breakdown of skin: Secondary | ICD-10-CM | POA: Diagnosis not present

## 2022-01-23 DIAGNOSIS — E11621 Type 2 diabetes mellitus with foot ulcer: Secondary | ICD-10-CM | POA: Diagnosis not present

## 2022-01-23 DIAGNOSIS — J449 Chronic obstructive pulmonary disease, unspecified: Secondary | ICD-10-CM | POA: Diagnosis not present

## 2022-01-23 DIAGNOSIS — C179 Malignant neoplasm of small intestine, unspecified: Secondary | ICD-10-CM | POA: Diagnosis not present

## 2022-01-23 DIAGNOSIS — L97415 Non-pressure chronic ulcer of right heel and midfoot with muscle involvement without evidence of necrosis: Secondary | ICD-10-CM | POA: Diagnosis not present

## 2022-01-23 DIAGNOSIS — M109 Gout, unspecified: Secondary | ICD-10-CM | POA: Diagnosis not present

## 2022-01-23 DIAGNOSIS — Z7984 Long term (current) use of oral hypoglycemic drugs: Secondary | ICD-10-CM | POA: Diagnosis not present

## 2022-01-23 DIAGNOSIS — Z7982 Long term (current) use of aspirin: Secondary | ICD-10-CM | POA: Diagnosis not present

## 2022-01-23 DIAGNOSIS — Z951 Presence of aortocoronary bypass graft: Secondary | ICD-10-CM | POA: Diagnosis not present

## 2022-01-23 DIAGNOSIS — Z79891 Long term (current) use of opiate analgesic: Secondary | ICD-10-CM | POA: Diagnosis not present

## 2022-01-23 DIAGNOSIS — M545 Low back pain, unspecified: Secondary | ICD-10-CM | POA: Diagnosis not present

## 2022-01-23 DIAGNOSIS — E1142 Type 2 diabetes mellitus with diabetic polyneuropathy: Secondary | ICD-10-CM | POA: Diagnosis not present

## 2022-01-23 DIAGNOSIS — Z87891 Personal history of nicotine dependence: Secondary | ICD-10-CM | POA: Diagnosis not present

## 2022-01-23 DIAGNOSIS — T25222D Burn of second degree of left foot, subsequent encounter: Secondary | ICD-10-CM | POA: Diagnosis not present

## 2022-01-23 DIAGNOSIS — E039 Hypothyroidism, unspecified: Secondary | ICD-10-CM | POA: Diagnosis not present

## 2022-01-23 DIAGNOSIS — I10 Essential (primary) hypertension: Secondary | ICD-10-CM | POA: Diagnosis not present

## 2022-01-25 DIAGNOSIS — I10 Essential (primary) hypertension: Secondary | ICD-10-CM | POA: Diagnosis not present

## 2022-01-25 DIAGNOSIS — E11621 Type 2 diabetes mellitus with foot ulcer: Secondary | ICD-10-CM | POA: Diagnosis not present

## 2022-01-25 DIAGNOSIS — J449 Chronic obstructive pulmonary disease, unspecified: Secondary | ICD-10-CM | POA: Diagnosis not present

## 2022-01-25 DIAGNOSIS — E1142 Type 2 diabetes mellitus with diabetic polyneuropathy: Secondary | ICD-10-CM | POA: Diagnosis not present

## 2022-01-25 DIAGNOSIS — T25222D Burn of second degree of left foot, subsequent encounter: Secondary | ICD-10-CM | POA: Diagnosis not present

## 2022-01-25 DIAGNOSIS — L97415 Non-pressure chronic ulcer of right heel and midfoot with muscle involvement without evidence of necrosis: Secondary | ICD-10-CM | POA: Diagnosis not present

## 2022-01-28 ENCOUNTER — Ambulatory Visit: Payer: Medicare Other | Admitting: Pulmonary Disease

## 2022-01-28 DIAGNOSIS — E1142 Type 2 diabetes mellitus with diabetic polyneuropathy: Secondary | ICD-10-CM | POA: Diagnosis not present

## 2022-01-28 DIAGNOSIS — L97415 Non-pressure chronic ulcer of right heel and midfoot with muscle involvement without evidence of necrosis: Secondary | ICD-10-CM | POA: Diagnosis not present

## 2022-01-28 DIAGNOSIS — E11621 Type 2 diabetes mellitus with foot ulcer: Secondary | ICD-10-CM | POA: Diagnosis not present

## 2022-01-28 DIAGNOSIS — I10 Essential (primary) hypertension: Secondary | ICD-10-CM | POA: Diagnosis not present

## 2022-01-28 DIAGNOSIS — J449 Chronic obstructive pulmonary disease, unspecified: Secondary | ICD-10-CM | POA: Diagnosis not present

## 2022-01-28 DIAGNOSIS — T25222D Burn of second degree of left foot, subsequent encounter: Secondary | ICD-10-CM | POA: Diagnosis not present

## 2022-01-29 ENCOUNTER — Other Ambulatory Visit (HOSPITAL_COMMUNITY): Payer: Self-pay | Admitting: Internal Medicine

## 2022-01-29 DIAGNOSIS — M546 Pain in thoracic spine: Secondary | ICD-10-CM | POA: Insufficient documentation

## 2022-01-29 DIAGNOSIS — R600 Localized edema: Secondary | ICD-10-CM | POA: Insufficient documentation

## 2022-01-29 DIAGNOSIS — M545 Low back pain, unspecified: Secondary | ICD-10-CM | POA: Diagnosis not present

## 2022-01-30 DIAGNOSIS — J449 Chronic obstructive pulmonary disease, unspecified: Secondary | ICD-10-CM | POA: Diagnosis not present

## 2022-01-30 DIAGNOSIS — L97415 Non-pressure chronic ulcer of right heel and midfoot with muscle involvement without evidence of necrosis: Secondary | ICD-10-CM | POA: Diagnosis not present

## 2022-01-30 DIAGNOSIS — E1142 Type 2 diabetes mellitus with diabetic polyneuropathy: Secondary | ICD-10-CM | POA: Diagnosis not present

## 2022-01-30 DIAGNOSIS — T25222D Burn of second degree of left foot, subsequent encounter: Secondary | ICD-10-CM | POA: Diagnosis not present

## 2022-01-30 DIAGNOSIS — I10 Essential (primary) hypertension: Secondary | ICD-10-CM | POA: Diagnosis not present

## 2022-01-30 DIAGNOSIS — E11621 Type 2 diabetes mellitus with foot ulcer: Secondary | ICD-10-CM | POA: Diagnosis not present

## 2022-01-31 ENCOUNTER — Ambulatory Visit (HOSPITAL_COMMUNITY)
Admission: RE | Admit: 2022-01-31 | Discharge: 2022-01-31 | Disposition: A | Discharge status: Home / Self Care | Payer: Medicare Other | Source: Ambulatory Visit | Attending: Internal Medicine | Admitting: Internal Medicine

## 2022-01-31 DIAGNOSIS — M546 Pain in thoracic spine: Secondary | ICD-10-CM | POA: Diagnosis not present

## 2022-02-07 DIAGNOSIS — J449 Chronic obstructive pulmonary disease, unspecified: Secondary | ICD-10-CM | POA: Diagnosis not present

## 2022-02-07 DIAGNOSIS — E1142 Type 2 diabetes mellitus with diabetic polyneuropathy: Secondary | ICD-10-CM | POA: Diagnosis not present

## 2022-02-07 DIAGNOSIS — E11621 Type 2 diabetes mellitus with foot ulcer: Secondary | ICD-10-CM | POA: Diagnosis not present

## 2022-02-07 DIAGNOSIS — I10 Essential (primary) hypertension: Secondary | ICD-10-CM | POA: Diagnosis not present

## 2022-02-07 DIAGNOSIS — T25222D Burn of second degree of left foot, subsequent encounter: Secondary | ICD-10-CM | POA: Diagnosis not present

## 2022-02-07 DIAGNOSIS — L97415 Non-pressure chronic ulcer of right heel and midfoot with muscle involvement without evidence of necrosis: Secondary | ICD-10-CM | POA: Diagnosis not present

## 2022-02-09 DIAGNOSIS — I509 Heart failure, unspecified: Secondary | ICD-10-CM | POA: Diagnosis not present

## 2022-02-09 DIAGNOSIS — Z7982 Long term (current) use of aspirin: Secondary | ICD-10-CM | POA: Diagnosis not present

## 2022-02-09 DIAGNOSIS — L03031 Cellulitis of right toe: Secondary | ICD-10-CM | POA: Diagnosis not present

## 2022-02-09 DIAGNOSIS — J449 Chronic obstructive pulmonary disease, unspecified: Secondary | ICD-10-CM | POA: Diagnosis not present

## 2022-02-09 DIAGNOSIS — Z79899 Other long term (current) drug therapy: Secondary | ICD-10-CM | POA: Diagnosis not present

## 2022-02-09 DIAGNOSIS — Z87891 Personal history of nicotine dependence: Secondary | ICD-10-CM | POA: Diagnosis not present

## 2022-02-09 DIAGNOSIS — Z881 Allergy status to other antibiotic agents status: Secondary | ICD-10-CM | POA: Diagnosis not present

## 2022-02-09 DIAGNOSIS — I110 Hypertensive heart disease with heart failure: Secondary | ICD-10-CM | POA: Diagnosis not present

## 2022-02-09 DIAGNOSIS — M7989 Other specified soft tissue disorders: Secondary | ICD-10-CM | POA: Diagnosis not present

## 2022-02-09 DIAGNOSIS — Z880 Allergy status to penicillin: Secondary | ICD-10-CM | POA: Diagnosis not present

## 2022-02-09 DIAGNOSIS — Z885 Allergy status to narcotic agent status: Secondary | ICD-10-CM | POA: Diagnosis not present

## 2022-02-09 DIAGNOSIS — Z79890 Hormone replacement therapy (postmenopausal): Secondary | ICD-10-CM | POA: Diagnosis not present

## 2022-02-09 DIAGNOSIS — E1165 Type 2 diabetes mellitus with hyperglycemia: Secondary | ICD-10-CM | POA: Diagnosis not present

## 2022-02-09 DIAGNOSIS — Z888 Allergy status to other drugs, medicaments and biological substances status: Secondary | ICD-10-CM | POA: Diagnosis not present

## 2022-02-09 DIAGNOSIS — Z7984 Long term (current) use of oral hypoglycemic drugs: Secondary | ICD-10-CM | POA: Diagnosis not present

## 2022-02-09 DIAGNOSIS — E079 Disorder of thyroid, unspecified: Secondary | ICD-10-CM | POA: Diagnosis not present

## 2022-02-11 ENCOUNTER — Ambulatory Visit (INDEPENDENT_AMBULATORY_CARE_PROVIDER_SITE_OTHER): Payer: Medicare Other | Admitting: Gastroenterology

## 2022-02-11 ENCOUNTER — Encounter (INDEPENDENT_AMBULATORY_CARE_PROVIDER_SITE_OTHER): Payer: Self-pay

## 2022-02-12 DIAGNOSIS — L97415 Non-pressure chronic ulcer of right heel and midfoot with muscle involvement without evidence of necrosis: Secondary | ICD-10-CM | POA: Diagnosis not present

## 2022-02-12 DIAGNOSIS — E119 Type 2 diabetes mellitus without complications: Secondary | ICD-10-CM | POA: Diagnosis not present

## 2022-02-12 DIAGNOSIS — L03119 Cellulitis of unspecified part of limb: Secondary | ICD-10-CM | POA: Diagnosis not present

## 2022-02-12 DIAGNOSIS — G2581 Restless legs syndrome: Secondary | ICD-10-CM | POA: Diagnosis not present

## 2022-02-12 DIAGNOSIS — E1142 Type 2 diabetes mellitus with diabetic polyneuropathy: Secondary | ICD-10-CM | POA: Diagnosis not present

## 2022-02-12 DIAGNOSIS — J449 Chronic obstructive pulmonary disease, unspecified: Secondary | ICD-10-CM | POA: Diagnosis not present

## 2022-02-12 DIAGNOSIS — E11621 Type 2 diabetes mellitus with foot ulcer: Secondary | ICD-10-CM | POA: Diagnosis not present

## 2022-02-12 DIAGNOSIS — T25222D Burn of second degree of left foot, subsequent encounter: Secondary | ICD-10-CM | POA: Diagnosis not present

## 2022-02-12 DIAGNOSIS — I10 Essential (primary) hypertension: Secondary | ICD-10-CM | POA: Diagnosis not present

## 2022-02-14 ENCOUNTER — Ambulatory Visit (INDEPENDENT_AMBULATORY_CARE_PROVIDER_SITE_OTHER): Payer: Medicare Other | Admitting: Gastroenterology

## 2022-02-14 ENCOUNTER — Encounter (INDEPENDENT_AMBULATORY_CARE_PROVIDER_SITE_OTHER): Payer: Self-pay | Admitting: Gastroenterology

## 2022-02-14 DIAGNOSIS — R1319 Other dysphagia: Secondary | ICD-10-CM | POA: Diagnosis not present

## 2022-02-14 DIAGNOSIS — D649 Anemia, unspecified: Secondary | ICD-10-CM

## 2022-02-14 DIAGNOSIS — R110 Nausea: Secondary | ICD-10-CM

## 2022-02-14 NOTE — Patient Instructions (Signed)
Please keep me posted on how labs look and discussion with dr Theador Hawthorne, I would recommend updating EGD for your swallowing, however, if you are continuing to have iron deficiency anemia, it would be best to update colonoscopy at the same time.  You have follow up already with DR. Castaenda in February 2024

## 2022-02-14 NOTE — Progress Notes (Signed)
Primary Care Physician:  Celene Squibb, MD  Primary GI: Jenetta Downer   Patient Location: Home   Provider Location: [place] office   Reason for Visit: dysphagia, nausea    Persons present on the virtual encounter, with roles: Jimmi Sidener L. Alver Sorrow, MSN, APRN, AGNP-C, [name]    Total time (minutes) spent on medical discussion: 6 minutes  Virtual Visit via Telephone  visit is conducted virtually and was requested by patient.   I connected with [name] on 02/14/22 at 11:45 AM EDT by telephone and verified that I am speaking with the correct person using two identifiers.   I discussed the limitations, risks, security and privacy concerns of performing an evaluation and management service by telephone and the availability of in person appointments. I also discussed with the patient that there may be a patient responsible charge related to this service. The patient expressed understanding and agreed to proceed.  Chief Complaint  Patient presents with   Dysphagia    Follow up. Reports dysphagia is about the same as last visit.  Reports pain and nausea have went away since since constipation is better.    History of Present Illness: Norma [name] is a 71 y.o. female with past medical history of bipolar disorder, iron deficiency anemia, CKD, COPD, coronary artery disease status post CABG, depression, diabetes, diabetic neuropathy, hyperlipidemia, fatty liver, gastroparesis, GERD, hypothyroidism, IBS, morbid obesity, stroke, restless leg syndrome.  Patient presenting today for follow up dysphagia and nausea.  Last seen July 2023, at that time having nausea without vomiting x1 month. Taking zofran with some relief. Also with some bloating and flatulence. Having some dysphagia at times especially with pills.  Recommended continue zofran for nausea, schedule EGD, consider CT A/P and GES if EGD negative. EGD was cancelled.   Last hgb 12 on 02/09/22  Present:  Patient  states she is doing pretty well. She is having some constipation, usually does not have to take anything for this. She is having a BM every day to every other day. Denies rectal bleeding, is on iron pills so stools are somewhat dark. Feels that nausea is now occurring very rarely. Denies any vomiting. She denies weight loss or changes in her appetite. Has recently been on some abx for cellulitis and prednisone, some steroid injections for shoulder and knee pain. She feels that she has dysphagia only with more gooey breads and salty foods. Tends to have issues mostly with pills, almost daily, larger pills have issues passing. She sometimes has to cough the pills back up and sometimes warm water will help them to pass.    She states that she is being followed by Dr. Theador Hawthorne for IDA, she is seeing him again at the end of this month to have her iron checked, she previously talked to Dr. Jenetta Downer about this and it was decided to hold off on EGD in case Dr. Theador Hawthorne recommended she also have EGD after updated iron studies. She would like to continue with this plan for now.   Last EGD: 2018 - Normal esophagus. - Z-line irregular, 35 cm from the incisors. - 2 cm hiatal hernia. - Portal hypertensive gastropathy. Biopsied - pathology showed mild chronic gastritis but negative for H. pylori - Erythematous mucosa in the prepyloric region of the stomach. - Normal duodenal bulb and second portion of the duodenum.  Last Colonoscopy:2018 - normal  Past Medical History:  Diagnosis Date   Anemia    Anxiety    Bipolar  disorder ([place])    Bulging lumbar disc    L3-4   Chronic daily headache    Chronic low back pain 09/20/2014   CKD (chronic kidney disease)    stage 3   COPD (chronic obstructive pulmonary disease) (HCC)    Coronary artery disease    CABG 2019   Degenerative arthritis    Depression    Diabetes mellitus, type II ([place])    Diabetic neuropathy ([place]) 09/16/2018   DM type 2 with diabetic peripheral  neuropathy ([place]) 09/20/2014   Dyslipidemia    Dyspnea    Family history of adverse reaction to anesthesia    father had reaction to anesthesia medication per patient "they don't use it anymore"- unsure of reaction   Fatty liver    per pt report   Gastroparesis    GERD (gastroesophageal reflux disease)    Heart murmur    History of hiatal hernia    pt states she no longer has this   Hypothyroidism    IBS (irritable bowel syndrome)    LBBB (left bundle branch block)    Memory difficulty 09/20/2014   Morbid obesity ([place])    Myocardial infarction ([place])    per pt- prior to CABG   Neuropathy    Obstructive sleep apnea on CPAP    Restless legs syndrome (RLS) 09/17/2012   Stroke (cerebrum) ([place]) 05/16/2017   Left parietal   Urticaria      Past Surgical History:  Procedure Laterality Date   ABDOMINAL HYSTERECTOMY     ARTHROSCOPY KNEE W/ DRILLING  06/2011   and Dece[place] of 2013.   Carpel tunnel  1980's   CATARACT EXTRACTION W/PHACO Right 03/21/2017   Procedure: CATARACT EXTRACTION PHACO AND INTRAOCULAR LENS PLACEMENT RIGHT EYE;  Surgeon: Baruch Goldmann, MD;  Location: [facility];  Service: Ophthalmology;  Laterality: Right;  CDE: 2.91    CATARACT EXTRACTION W/PHACO Left 04/18/2017   Procedure: CATARACT EXTRACTION PHACO AND INTRAOCULAR LENS PLACEMENT LEFT EYE;  Surgeon: Baruch Goldmann, MD;  Location: [facility];  Service: Ophthalmology;  Laterality: Left;  left   CHOLECYSTECTOMY     COLONOSCOPY WITH PROPOFOL N/A 10/04/2016   Procedure: COLONOSCOPY WITH PROPOFOL;  Surgeon: Rogene [place], MD;  [facility];  Service: Endoscopy;  Laterality: N/A;  11:10   CORONARY ARTERY BYPASS GRAFT N/A 08/29/2017   Procedure: CORONARY ARTERY BYPASS GRAFTING (CABG) x 3 WITH ENDOSCOPIC HARVESTING OF RIGHT SAPHENOUS VEIN;  Surgeon: Ivin Poot, MD;  Location: [place];  Service: Open Heart Surgery;  Laterality: N/A;   ESOPHAGOGASTRODUODENOSCOPY N/A 04/25/2016   Procedure:  ESOPHAGOGASTRODUODENOSCOPY (EGD);  Surgeon: Rogene [place], MD;  [facility];  Service: Endoscopy;  Laterality: N/A;  730   LEFT HEART CATH AND CORONARY ANGIOGRAPHY N/A 08/27/2017   Procedure: LEFT HEART CATH AND CORONARY ANGIOGRAPHY;  Surgeon: Sherren Mocha, MD;  Location: [facility];  Service: Cardiovascular::  pLAD 95% - p-mLAD 50%, ostD1 90% -pD1 80%; ostOM1 90%; rPDA 80%. EF ~50-55% - HK of dital Anterolateral & Apical wall.  - Rec CVTS c/s   NECK SURGERY     pt reports having growth removed from the back of her neck- after 2021   OPEN REDUCTION INTERNAL FIXATION (ORIF) HAND Left 04/10/2020   Procedure: OPEN REDUCTION INTERNAL FIXATION (ORIF) HAND, left index finger;  Surgeon: Carole Civil, MD;  Location: [facility];  Service: Orthopedics;  Laterality: Left;  0.045 k wires   RECTAL SURGERY     fissure  SHOULDER SURGERY Left    arthroscopy in March of this year   TEE WITHOUT CARDIOVERSION N/A 08/29/2017   Procedure: TRANSESOPHAGEAL ECHOCARDIOGRAM (TEE);  Surgeon: Prescott Gum, Collier [place]ina, MD;  Location: [facility];  Service: Open Heart Surgery;  Laterality: N/A;   TRANSFORAMINAL LUMBAR INTERBODY FUSION W/ MIS 1 LEVEL Right 07/12/2021   Procedure: Minimally Invasive Surgery Transforaminal Lumbar Interbody Fusion  Lumbar four-five;  Surgeon: Vallarie Mare, MD;  Location: [facility];  Service: Neurosurgery;  Laterality: Right;   TRANSTHORACIC ECHOCARDIOGRAM  06/06/2017   Mild to moderate reduced EF 40 and 45%.  Anterior septal, inferoseptal and basal to mid inferior hypokinesis.  GR 1 DD.  No significant valvular lesion     Current Meds  Medication Sig   ALPRAZolam (XANAX) 0.25 MG tablet Take 1 tablet (0.25 mg total) by mouth at bedtime as needed for anxiety or sleep.   ARIPiprazole (ABILIFY) 2 MG tablet Take 1 tablet (2 mg total) by mouth daily.   aspirin EC 81 MG tablet Take 1 tablet (81 mg total) by mouth daily.   buPROPion (WELLBUTRIN XL) 150 MG 24 hr tablet Take 1 tablet  (150 mg total) by mouth every morning.   carvedilol (COREG) 6.25 MG tablet Take 1 tablet (6.25 mg total) by mouth 2 (two) times daily with a meal.   cetirizine (ZYRTEC) 10 MG tablet Take 1 tablet tablet 1-2 times daily. (Patient taking differently: Take 10 mg by mouth at bedtime.)   diclofenac Sodium (VOLTAREN) 1 % GEL Apply 2 g topically 4 (four) times daily as needed (pain).   diphenhydrAMINE (BENADRYL) 25 mg capsule Take 25 mg by mouth as needed for allergies or itching.   empagliflozin (JARDIANCE) 10 MG TABS tablet Take 10 mg by mouth daily.   EPINEPHrine 0.3 mg/0.3 mL IJ SOAJ injection Inject 0.3 mg into the muscle once.   ferrous sulfate 325 (65 FE) MG EC tablet Take 325 mg by mouth. One daily   fluticasone (FLONASE) 50 MCG/ACT nasal spray Place 1 spray into both nostrils daily.   glipiZIDE (GLUCOTROL XL) 5 MG 24 hr tablet SMARTSIG:1 Tablet(s) By Mouth Morning-Evening   lamoTRIgine (LAMICTAL) 150 MG tablet Take 1 tablet (150 mg total) by mouth at bedtime.   levothyroxine (SYNTHROID) 112 MCG tablet Take 112 mcg by mouth daily before breakfast.   losartan (COZAAR) 50 MG tablet Take 50 mg by mouth every morning.   meclizine (ANTIVERT) 25 MG tablet Take 1 tablet (25 mg total) by mouth 3 (three) times daily as needed for dizziness. (Patient taking differently: Take 25 mg by mouth daily as needed for dizziness.)   metFORMIN (GLUCOPHAGE) 500 MG tablet TAKE ONE TABLET BY MOUTH TWICE DAILY WITH A MEAL (MORNING ,EVENING) (Patient taking differently: Take 500 mg by mouth 2 (two) times daily with a meal.)   ondansetron (ZOFRAN) 4 MG tablet Take 1 tablet (4 mg total) by mouth every 8 (eight) hours as needed for nausea or vomiting.   OVER THE COUNTER MEDICATION Take 1 tablet by mouth daily as needed (pain). CBD Gummies   pantoprazole (PROTONIX) 40 MG tablet Take 1 tablet (40 mg total) by mouth daily before supper.   REPATHA SURECLICK 812 MG/ML SOAJ INJECT ONE DOSE INTO SKIN EVERY 14 DAYS. (Patient taking  differently: Inject 140 mg into the skin every 14 (fourteen) days.)   rOPINIRole (REQUIP) 3 MG tablet TAKE ONE TABLET BY MOUTH EVERY MORNING, ,NOON AND AT BEDTIME. (PRT PT MORNING,EVENING,BEDTIME) (Patient taking differently: Take 3 mg by mouth 3 (three)  times daily.)   traMADol (ULTRAM) 50 MG tablet Take 1 tablet (50 mg total) by mouth every 6 (six) hours as needed.   venlafaxine XR (EFFEXOR-XR) 150 MG 24 hr capsule Take 2 capsules (300 mg total) by mouth at bedtime.   Vitamin D, Ergocalciferol, (DRISDOL) 1.25 MG (50000 UNIT) CAPS capsule Take 50,000 Units by mouth every 7 (seven) days. Sunday     Family History  Problem Relation Age of Onset   Hypertension Mother    Lymphoma Mother    Depression Mother    Arthritis Father    Alcohol abuse Father    Hypertension Sister    Cancer Brother        kidney and lung   Alcohol abuse Brother    Alcohol abuse Paternal Uncle    Alcohol abuse Paternal Grandfather    Alcohol abuse Paternal Grandmother    Allergic rhinitis Neg Hx    Angioedema Neg Hx    Asthma Neg Hx    Atopy Neg Hx    Eczema Neg Hx    Immunodeficiency Neg Hx    Urticaria Neg Hx     Social History   Socioeconomic History   Marital status: Single    Spouse name: Not on file   Number of children: 0   Years of education: 16   Highest education level: Not on file  Occupational History    Employer: [employer]  Tobacco Use   Smoking status: Former    Types: Cigarettes    Quit date: 02/04/1971    Years since quitting: 51.0   Smokeless tobacco: Never   Tobacco comments:    smoked 2 cigarettes a day  Vaping Use   Vaping Use: Never used  Substance and Sexual Activity   Alcohol use: No    Alcohol/week: 0.0 standard drinks of alcohol   Drug use: No   Sexual activity: Never  Other Topics Concern   Not on file  Social History Narrative   Patient lives at home alone.    Patient has no children.    Patient has her masters in nursing.    Patient is single.     Patient drinks about 2 glasses of tea daily.   Patient is right handed.   Social Determinants of Health   Financial Resource Strain: Medium Risk (12/18/2021)   Overall Financial Resource Strain (CARDIA)    Difficulty of Paying Living Expenses: Somewhat hard  Food Insecurity: No Food Insecurity (12/18/2021)   Hunger Vital Sign    Worried About Running Out of Food in the Last Year: Never true    Ran Out of Food in the Last Year: Never true  Transportation Needs: No Transportation Needs (12/18/2021)   PRAPARE - Hydrologist (Medical): No    Lack of Transportation (Non-Medical): No  Physical Activity: Not on file  Stress: No Stress Concern Present (10/04/2021)   [place]    Feeling of Stress : Only a little  Social Connections: Not on file   Review of Systems: Gen: Denies fever, chills, anorexia. Denies fatigue, weakness, weight loss.  CV: Denies chest pain, palpitations, syncope, peripheral edema, and claudication. Resp: Denies dyspnea at rest, cough, wheezing, coughing up blood, and pleurisy. GI: see HPI Derm: Denies rash, itching, dry skin Psych: Denies depression, anxiety, memory loss, confusion. No homicidal or suicidal ideation.  Heme: Denies bruising, bleeding, and enlarged lymph nodes.  Observations/Objective: No distress. Unable to perform physical  exam due to telephone encounter. No video available.   Assessment and Plan: [name] is presenting for follow up of nausea and dysphagia.  Nausea is much improved, having this only on occasion. No vomiting. Appetite is good. Denies weight loss. No abdominal pain. Having some constipation but does not have to take anything for this. She denies rectal bleeding or melena.   Continues to have some dysphagia, mostly with pills, previously scheduled for EGD at recommendation of Dr. Jenetta Downer at last visit, however, given her ongoing  IDA, she is supposed to see nephrology at the end of this month for more iron studies. After discussion, she decided at that time to hold off on EGD in case nephrologist recommended colonoscopy based on her iron studies. She would like to continue with this plan. She will make me aware of how she wants to proceed, EGD or both EGD and Colonoscopy after she sees nephrology. Notably hgb at the end of October was 12, she Is maintained on supplemental iron.   -proceed with EGD +/-colonoscopy depending on status of IDA, patient to keep me updated -continue zofran as needed  Follow Up Instructions: Has F/u In feb   I discussed the assessment and treatment plan with the patient. The patient was provided an opportunity to ask questions and all were answered. The patient agreed with the plan and demonstrated an understanding of the instructions.   The patient was advised to call back or seek an in-person evaluation if the symptoms worsen or if the condition fails to improve as anticipated.  I provided 6 minutes of NON face-to-face time during this telephone encounter   Abdullahi Vallone L. Alver Sorrow, MSN, APRN, AGNP-C Adult-Gerontology Nurse Practitioner [facility]

## 2022-02-15 DIAGNOSIS — Z8701 Personal history of pneumonia (recurrent): Secondary | ICD-10-CM | POA: Diagnosis not present

## 2022-02-15 DIAGNOSIS — Z1379 Encounter for other screening for genetic and chromosomal anomalies: Secondary | ICD-10-CM | POA: Diagnosis not present

## 2022-02-15 DIAGNOSIS — Z840 Family history of diseases of the skin and subcutaneous tissue: Secondary | ICD-10-CM | POA: Diagnosis not present

## 2022-02-15 DIAGNOSIS — Z8719 Personal history of other diseases of the digestive system: Secondary | ICD-10-CM | POA: Diagnosis not present

## 2022-02-15 DIAGNOSIS — Z862 Personal history of diseases of the blood and blood-forming organs and certain disorders involving the immune mechanism: Secondary | ICD-10-CM | POA: Diagnosis not present

## 2022-02-15 DIAGNOSIS — Z8572 Personal history of non-Hodgkin lymphomas: Secondary | ICD-10-CM | POA: Diagnosis not present

## 2022-02-15 DIAGNOSIS — Z8639 Personal history of other endocrine, nutritional and metabolic disease: Secondary | ICD-10-CM | POA: Diagnosis not present

## 2022-02-15 DIAGNOSIS — G2581 Restless legs syndrome: Secondary | ICD-10-CM | POA: Diagnosis not present

## 2022-02-16 DIAGNOSIS — E1165 Type 2 diabetes mellitus with hyperglycemia: Secondary | ICD-10-CM | POA: Insufficient documentation

## 2022-02-17 NOTE — Addendum Note (Signed)
Addended by: Montez Morita, Quillian Quince on: 02/17/2022 10:00 PM   Modules accepted: Level of Service

## 2022-02-21 ENCOUNTER — Encounter: Payer: Self-pay | Admitting: *Deleted

## 2022-02-21 DIAGNOSIS — I5042 Chronic combined systolic (congestive) and diastolic (congestive) heart failure: Secondary | ICD-10-CM | POA: Diagnosis not present

## 2022-02-21 DIAGNOSIS — I129 Hypertensive chronic kidney disease with stage 1 through stage 4 chronic kidney disease, or unspecified chronic kidney disease: Secondary | ICD-10-CM | POA: Diagnosis not present

## 2022-02-21 DIAGNOSIS — N189 Chronic kidney disease, unspecified: Secondary | ICD-10-CM | POA: Diagnosis not present

## 2022-02-21 DIAGNOSIS — E611 Iron deficiency: Secondary | ICD-10-CM | POA: Diagnosis not present

## 2022-02-21 DIAGNOSIS — E1122 Type 2 diabetes mellitus with diabetic chronic kidney disease: Secondary | ICD-10-CM | POA: Diagnosis not present

## 2022-02-22 DIAGNOSIS — E1142 Type 2 diabetes mellitus with diabetic polyneuropathy: Secondary | ICD-10-CM | POA: Diagnosis not present

## 2022-02-22 DIAGNOSIS — C179 Malignant neoplasm of small intestine, unspecified: Secondary | ICD-10-CM | POA: Diagnosis not present

## 2022-02-22 DIAGNOSIS — M545 Low back pain, unspecified: Secondary | ICD-10-CM | POA: Diagnosis not present

## 2022-02-22 DIAGNOSIS — E039 Hypothyroidism, unspecified: Secondary | ICD-10-CM | POA: Diagnosis not present

## 2022-02-22 DIAGNOSIS — M109 Gout, unspecified: Secondary | ICD-10-CM | POA: Diagnosis not present

## 2022-02-22 DIAGNOSIS — Z87891 Personal history of nicotine dependence: Secondary | ICD-10-CM | POA: Diagnosis not present

## 2022-02-22 DIAGNOSIS — I10 Essential (primary) hypertension: Secondary | ICD-10-CM | POA: Diagnosis not present

## 2022-02-22 DIAGNOSIS — L97415 Non-pressure chronic ulcer of right heel and midfoot with muscle involvement without evidence of necrosis: Secondary | ICD-10-CM | POA: Diagnosis not present

## 2022-02-22 DIAGNOSIS — Z951 Presence of aortocoronary bypass graft: Secondary | ICD-10-CM | POA: Diagnosis not present

## 2022-02-22 DIAGNOSIS — Z7984 Long term (current) use of oral hypoglycemic drugs: Secondary | ICD-10-CM | POA: Diagnosis not present

## 2022-02-22 DIAGNOSIS — J449 Chronic obstructive pulmonary disease, unspecified: Secondary | ICD-10-CM | POA: Diagnosis not present

## 2022-02-22 DIAGNOSIS — Z79891 Long term (current) use of opiate analgesic: Secondary | ICD-10-CM | POA: Diagnosis not present

## 2022-02-22 DIAGNOSIS — T25222D Burn of second degree of left foot, subsequent encounter: Secondary | ICD-10-CM | POA: Diagnosis not present

## 2022-02-22 DIAGNOSIS — E11621 Type 2 diabetes mellitus with foot ulcer: Secondary | ICD-10-CM | POA: Diagnosis not present

## 2022-02-22 DIAGNOSIS — Z7982 Long term (current) use of aspirin: Secondary | ICD-10-CM | POA: Diagnosis not present

## 2022-02-25 ENCOUNTER — Other Ambulatory Visit (HOSPITAL_COMMUNITY): Payer: Self-pay | Admitting: Neurosurgery

## 2022-02-25 DIAGNOSIS — Z6841 Body Mass Index (BMI) 40.0 and over, adult: Secondary | ICD-10-CM | POA: Diagnosis not present

## 2022-02-25 DIAGNOSIS — L97415 Non-pressure chronic ulcer of right heel and midfoot with muscle involvement without evidence of necrosis: Secondary | ICD-10-CM | POA: Diagnosis not present

## 2022-02-25 DIAGNOSIS — M5416 Radiculopathy, lumbar region: Secondary | ICD-10-CM | POA: Diagnosis not present

## 2022-02-25 DIAGNOSIS — I10 Essential (primary) hypertension: Secondary | ICD-10-CM | POA: Diagnosis not present

## 2022-02-25 DIAGNOSIS — E1142 Type 2 diabetes mellitus with diabetic polyneuropathy: Secondary | ICD-10-CM | POA: Diagnosis not present

## 2022-02-25 DIAGNOSIS — E11621 Type 2 diabetes mellitus with foot ulcer: Secondary | ICD-10-CM | POA: Diagnosis not present

## 2022-02-25 DIAGNOSIS — T25222D Burn of second degree of left foot, subsequent encounter: Secondary | ICD-10-CM | POA: Diagnosis not present

## 2022-02-25 DIAGNOSIS — J449 Chronic obstructive pulmonary disease, unspecified: Secondary | ICD-10-CM | POA: Diagnosis not present

## 2022-03-01 ENCOUNTER — Ambulatory Visit: Payer: Self-pay | Admitting: *Deleted

## 2022-03-01 NOTE — Patient Outreach (Signed)
  Care Coordination   03/01/2022 Name: Norma [name] MRN: [id number] DOB: March 22, 1951   Care Coordination Outreach Attempts:  An unsuccessful telephone outreach was attempted today to offer the patient information about available care coordination services as a benefit of their health plan.   Follow Up Plan:  Additional outreach attempts will be made to offer the patient care coordination information and services.   Encounter Outcome:  No Answer  Care Coordination Interventions Activated:  No   Care Coordination Interventions:  No, not indicated    Amillion Scobee L. Lavina Hamman, RN, BSN, [place] Coordinator Office number [phone]

## 2022-03-04 ENCOUNTER — Other Ambulatory Visit: Payer: Self-pay | Admitting: *Deleted

## 2022-03-04 DIAGNOSIS — T25222D Burn of second degree of left foot, subsequent encounter: Secondary | ICD-10-CM | POA: Diagnosis not present

## 2022-03-04 DIAGNOSIS — I10 Essential (primary) hypertension: Secondary | ICD-10-CM | POA: Diagnosis not present

## 2022-03-04 DIAGNOSIS — J449 Chronic obstructive pulmonary disease, unspecified: Secondary | ICD-10-CM | POA: Diagnosis not present

## 2022-03-04 DIAGNOSIS — L97415 Non-pressure chronic ulcer of right heel and midfoot with muscle involvement without evidence of necrosis: Secondary | ICD-10-CM | POA: Diagnosis not present

## 2022-03-04 DIAGNOSIS — E1142 Type 2 diabetes mellitus with diabetic polyneuropathy: Secondary | ICD-10-CM | POA: Diagnosis not present

## 2022-03-04 DIAGNOSIS — E11621 Type 2 diabetes mellitus with foot ulcer: Secondary | ICD-10-CM | POA: Diagnosis not present

## 2022-03-05 ENCOUNTER — Telehealth: Payer: Self-pay | Admitting: *Deleted

## 2022-03-05 DIAGNOSIS — L97511 Non-pressure chronic ulcer of other part of right foot limited to breakdown of skin: Secondary | ICD-10-CM | POA: Diagnosis not present

## 2022-03-05 NOTE — Patient Outreach (Signed)
  Care Coordination   03/05/2022 Name: Norma [name] MRN: [id number] DOB: Nov 06, 1950   Care Coordination Outreach Attempts:  A second unsuccessful outreach was attempted today to offer the patient with information about available care coordination services as a benefit of their health plan.     Follow Up Plan:  Additional outreach attempts will be made to offer the patient care coordination information and services.   Encounter Outcome:  No Answer  Care Coordination Interventions Activated:  No   Care Coordination Interventions:  No, not indicated    Martyn Timme L. Lavina Hamman, RN, BSN, [place] Coordinator Office number [phone]

## 2022-03-11 DIAGNOSIS — J449 Chronic obstructive pulmonary disease, unspecified: Secondary | ICD-10-CM | POA: Diagnosis not present

## 2022-03-11 DIAGNOSIS — T25222D Burn of second degree of left foot, subsequent encounter: Secondary | ICD-10-CM | POA: Diagnosis not present

## 2022-03-11 DIAGNOSIS — E1142 Type 2 diabetes mellitus with diabetic polyneuropathy: Secondary | ICD-10-CM | POA: Diagnosis not present

## 2022-03-11 DIAGNOSIS — E11621 Type 2 diabetes mellitus with foot ulcer: Secondary | ICD-10-CM | POA: Diagnosis not present

## 2022-03-11 DIAGNOSIS — L97415 Non-pressure chronic ulcer of right heel and midfoot with muscle involvement without evidence of necrosis: Secondary | ICD-10-CM | POA: Diagnosis not present

## 2022-03-11 DIAGNOSIS — I10 Essential (primary) hypertension: Secondary | ICD-10-CM | POA: Diagnosis not present

## 2022-03-13 ENCOUNTER — Ambulatory Visit: Payer: Self-pay | Admitting: *Deleted

## 2022-03-13 NOTE — Patient Outreach (Signed)
  Care Coordination   Follow Up Visit Note   03/13/2022 Name: Norma [name] MRN: [id number] DOB: Sep 11, 1950  Norma [name] is a 71 y.o. year old female who sees [name], Edwinna Areola, MD for primary care. I spoke with  [name] by phone today.  What matters to the patients health and wellness today?  Not needing anything this month  Dr Grandville Silos want to do CT of back  Had IV iron and has to pend CT for 2 months  Therefore EGD/colonoscopy is also pended    She reports some concerns with getting her refills on medicine like Requip   Goals Addressed               This Visit's Progress     Patient [place] maintenance [facility]) (pt-stated)   On track     Care Coordination Interventions: Patient interviewed about adult health maintenance status including  Colonoscopy    Confirmed pending colonoscopy after a CT completion Her doctors are aware per patient       nausea management [facility]) (pt-stated)   On track     Start date 11/14/21   Care Coordination Interventions: Confirmed her nausea continues but improved (? Constipation) EGD/colonoscopy pended by iron studies          SDOH assessments and interventions completed:  Yes     Care Coordination Interventions:  Yes, provided   Follow up plan: Follow up call scheduled for 05/07/22 1 pm     Encounter Outcome:  Pt. Visit Completed   Rockey Guarino L. Lavina Hamman, RN, BSN, [place] Coordinator Office number [phone]

## 2022-03-13 NOTE — Patient Instructions (Addendum)
Visit Information  Thank you for taking time to visit with me today. Please don't hesitate to contact me if I can be of assistance to you.   Following are the goals we discussed today:   Goals Addressed               This Visit's Progress     Patient Stated     Health maintenance [facility]) (pt-stated)   On track     Care Coordination Interventions: Patient interviewed about adult health maintenance status including  Colonoscopy    Confirmed pending colonoscopy after a CT completion Her doctors are aware per patient       nausea management [facility] (pt-stated)   On track     Start date 11/14/21   Care Coordination Interventions: Confirmed her nausea continues but improved (? Constipation) EGD/colonoscopy pended by iron studies          Our next appointment is by telephone on 05/07/22 at 1 pm  Please call the care guide team at [phone] if you need to cancel or reschedule your appointment.   If you are experiencing a Mental Health or [place] or need someone to talk to, please call the Suicide and Crisis Lifeline: 988 call the [place] National Suicide Prevention Lifeline: 743-654-8005 or TTY: 607-337-4907 TTY 216-849-8562) to talk to a trained counselor call 1-800-273-TALK (toll free, 24 hour hotline) call the [facility]: (239)585-0684 call 911   Patient verbalizes understanding of instructions and care plan provided today and agrees to view in [place]. Active MyChart status and patient understanding of how to access instructions and care plan via MyChart confirmed with patient.     The patient has been provided with contact information for the care management team and has been advised to call with any health related questions or concerns.   Payal Stanforth L. Lavina Hamman, RN, BSN, [place] Coordinator Office number [phone]

## 2022-03-21 DIAGNOSIS — E1142 Type 2 diabetes mellitus with diabetic polyneuropathy: Secondary | ICD-10-CM | POA: Diagnosis not present

## 2022-03-21 DIAGNOSIS — I10 Essential (primary) hypertension: Secondary | ICD-10-CM | POA: Diagnosis not present

## 2022-03-21 DIAGNOSIS — T25222D Burn of second degree of left foot, subsequent encounter: Secondary | ICD-10-CM | POA: Diagnosis not present

## 2022-03-21 DIAGNOSIS — J449 Chronic obstructive pulmonary disease, unspecified: Secondary | ICD-10-CM | POA: Diagnosis not present

## 2022-03-21 DIAGNOSIS — L97415 Non-pressure chronic ulcer of right heel and midfoot with muscle involvement without evidence of necrosis: Secondary | ICD-10-CM | POA: Diagnosis not present

## 2022-03-21 DIAGNOSIS — E11621 Type 2 diabetes mellitus with foot ulcer: Secondary | ICD-10-CM | POA: Diagnosis not present

## 2022-03-24 DIAGNOSIS — Z951 Presence of aortocoronary bypass graft: Secondary | ICD-10-CM | POA: Diagnosis not present

## 2022-03-24 DIAGNOSIS — E1142 Type 2 diabetes mellitus with diabetic polyneuropathy: Secondary | ICD-10-CM | POA: Diagnosis not present

## 2022-03-24 DIAGNOSIS — Z79899 Other long term (current) drug therapy: Secondary | ICD-10-CM | POA: Diagnosis not present

## 2022-03-24 DIAGNOSIS — I10 Essential (primary) hypertension: Secondary | ICD-10-CM | POA: Diagnosis not present

## 2022-03-24 DIAGNOSIS — E039 Hypothyroidism, unspecified: Secondary | ICD-10-CM | POA: Diagnosis not present

## 2022-03-24 DIAGNOSIS — L97415 Non-pressure chronic ulcer of right heel and midfoot with muscle involvement without evidence of necrosis: Secondary | ICD-10-CM | POA: Diagnosis not present

## 2022-03-24 DIAGNOSIS — M109 Gout, unspecified: Secondary | ICD-10-CM | POA: Diagnosis not present

## 2022-03-24 DIAGNOSIS — Z79891 Long term (current) use of opiate analgesic: Secondary | ICD-10-CM | POA: Diagnosis not present

## 2022-03-24 DIAGNOSIS — E11621 Type 2 diabetes mellitus with foot ulcer: Secondary | ICD-10-CM | POA: Diagnosis not present

## 2022-03-24 DIAGNOSIS — L97421 Non-pressure chronic ulcer of left heel and midfoot limited to breakdown of skin: Secondary | ICD-10-CM | POA: Diagnosis not present

## 2022-03-24 DIAGNOSIS — Z7984 Long term (current) use of oral hypoglycemic drugs: Secondary | ICD-10-CM | POA: Diagnosis not present

## 2022-03-24 DIAGNOSIS — Z7982 Long term (current) use of aspirin: Secondary | ICD-10-CM | POA: Diagnosis not present

## 2022-03-24 DIAGNOSIS — Z87891 Personal history of nicotine dependence: Secondary | ICD-10-CM | POA: Diagnosis not present

## 2022-03-24 DIAGNOSIS — J449 Chronic obstructive pulmonary disease, unspecified: Secondary | ICD-10-CM | POA: Diagnosis not present

## 2022-03-24 DIAGNOSIS — M545 Low back pain, unspecified: Secondary | ICD-10-CM | POA: Diagnosis not present

## 2022-03-24 DIAGNOSIS — C179 Malignant neoplasm of small intestine, unspecified: Secondary | ICD-10-CM | POA: Diagnosis not present

## 2022-03-26 DIAGNOSIS — E1122 Type 2 diabetes mellitus with diabetic chronic kidney disease: Secondary | ICD-10-CM | POA: Diagnosis not present

## 2022-03-26 DIAGNOSIS — I129 Hypertensive chronic kidney disease with stage 1 through stage 4 chronic kidney disease, or unspecified chronic kidney disease: Secondary | ICD-10-CM | POA: Diagnosis not present

## 2022-03-26 DIAGNOSIS — R809 Proteinuria, unspecified: Secondary | ICD-10-CM | POA: Diagnosis not present

## 2022-03-26 DIAGNOSIS — E1129 Type 2 diabetes mellitus with other diabetic kidney complication: Secondary | ICD-10-CM | POA: Diagnosis not present

## 2022-03-26 DIAGNOSIS — G4733 Obstructive sleep apnea (adult) (pediatric): Secondary | ICD-10-CM | POA: Diagnosis not present

## 2022-03-26 DIAGNOSIS — I5042 Chronic combined systolic (congestive) and diastolic (congestive) heart failure: Secondary | ICD-10-CM | POA: Diagnosis not present

## 2022-03-26 DIAGNOSIS — E611 Iron deficiency: Secondary | ICD-10-CM | POA: Diagnosis not present

## 2022-03-26 DIAGNOSIS — N189 Chronic kidney disease, unspecified: Secondary | ICD-10-CM | POA: Diagnosis not present

## 2022-03-27 DIAGNOSIS — I10 Essential (primary) hypertension: Secondary | ICD-10-CM | POA: Diagnosis not present

## 2022-03-27 DIAGNOSIS — E1142 Type 2 diabetes mellitus with diabetic polyneuropathy: Secondary | ICD-10-CM | POA: Diagnosis not present

## 2022-03-27 DIAGNOSIS — E11621 Type 2 diabetes mellitus with foot ulcer: Secondary | ICD-10-CM | POA: Diagnosis not present

## 2022-03-27 DIAGNOSIS — J449 Chronic obstructive pulmonary disease, unspecified: Secondary | ICD-10-CM | POA: Diagnosis not present

## 2022-03-27 DIAGNOSIS — L97421 Non-pressure chronic ulcer of left heel and midfoot limited to breakdown of skin: Secondary | ICD-10-CM | POA: Diagnosis not present

## 2022-03-27 DIAGNOSIS — L97415 Non-pressure chronic ulcer of right heel and midfoot with muscle involvement without evidence of necrosis: Secondary | ICD-10-CM | POA: Diagnosis not present

## 2022-04-01 DIAGNOSIS — I10 Essential (primary) hypertension: Secondary | ICD-10-CM | POA: Diagnosis not present

## 2022-04-01 DIAGNOSIS — L97421 Non-pressure chronic ulcer of left heel and midfoot limited to breakdown of skin: Secondary | ICD-10-CM | POA: Diagnosis not present

## 2022-04-01 DIAGNOSIS — J449 Chronic obstructive pulmonary disease, unspecified: Secondary | ICD-10-CM | POA: Diagnosis not present

## 2022-04-01 DIAGNOSIS — E1142 Type 2 diabetes mellitus with diabetic polyneuropathy: Secondary | ICD-10-CM | POA: Diagnosis not present

## 2022-04-01 DIAGNOSIS — L97415 Non-pressure chronic ulcer of right heel and midfoot with muscle involvement without evidence of necrosis: Secondary | ICD-10-CM | POA: Diagnosis not present

## 2022-04-01 DIAGNOSIS — E039 Hypothyroidism, unspecified: Secondary | ICD-10-CM | POA: Diagnosis not present

## 2022-04-01 DIAGNOSIS — E11621 Type 2 diabetes mellitus with foot ulcer: Secondary | ICD-10-CM | POA: Diagnosis not present

## 2022-04-02 IMAGING — MR MR LUMBAR SPINE W/O CM
5 series · 31 of 48 positions shown · non-contrast
Comparison: MR Lumbar 09/29/2018.  MR Lumbar 06/10/2014.

CLINICAL DATA: Patient complains of low back pain x 6 weeks with
bilateral leg pain x 1 year. No known injury. She does have a
history of lumbar spine surgery, but no history of cancer.

EXAM:
MRI LUMBAR SPINE WITHOUT CONTRAST
TECHNIQUE: Multiplanar, multisequence MR imaging of the lumbar spine was
performed. No intravenous contrast was administered.

[Series 5: T2 · sagittal · 4.0mm · 0.68mm/px · 6 of 15 slices shown (1 of 2)]
[im 1/15]
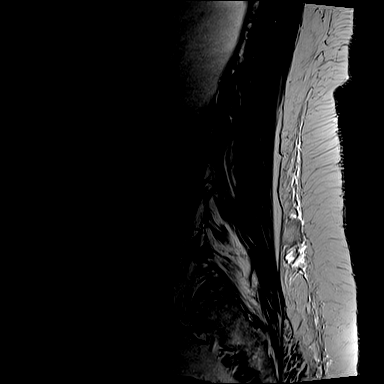
[im 3/15]
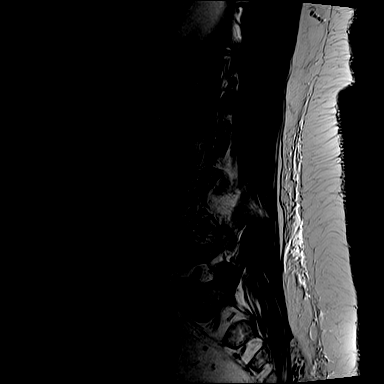
[im 6/15]
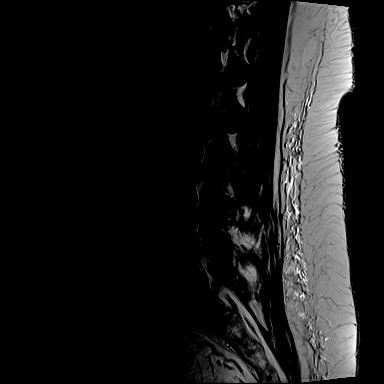
[im 9/15]
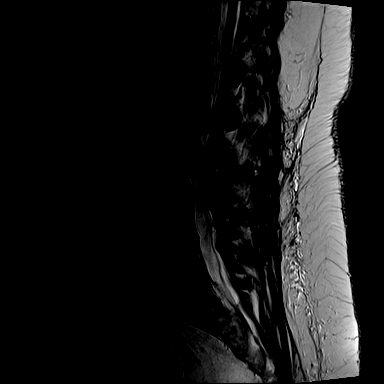
[im 12/15]
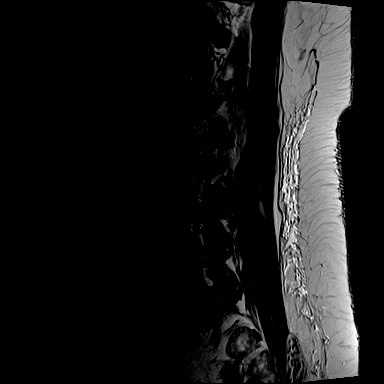
[im 15/15]
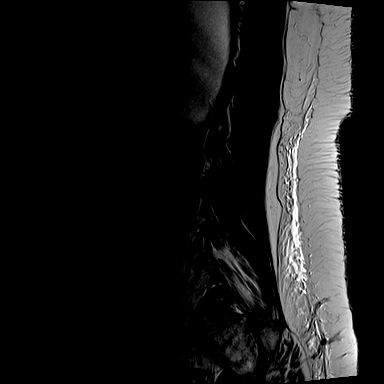

[Series 6: T1 · sagittal · 4.0mm · 0.81mm/px · 7 of 15 slices shown (1 of 2)]
[im 1/15]
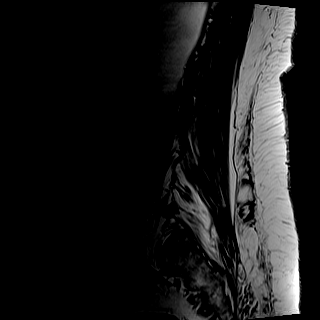
[im 3/15]
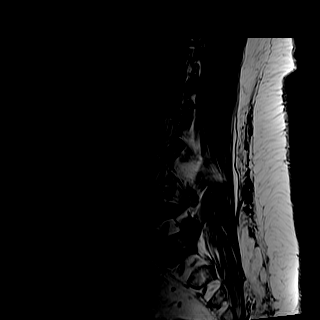
[im 5/15]
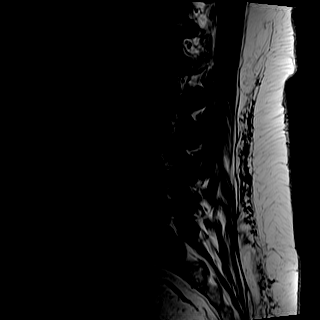
[im 8/15]
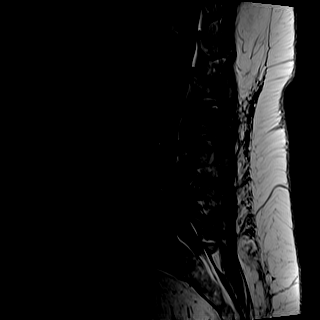
[im 10/15]
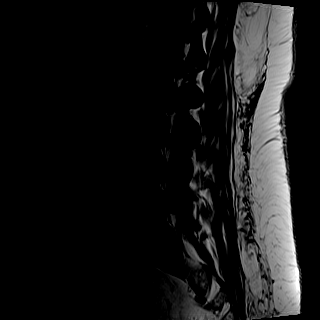
[im 12/15]
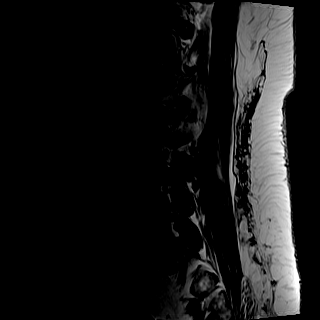
[im 15/15]
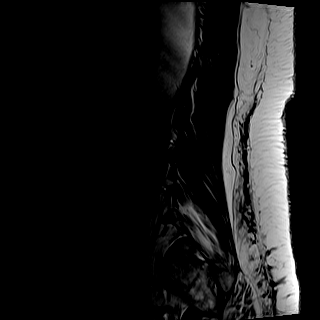

[Series 7: STIR · sagittal · 4.0mm · 0.51mm/px · 2 of 15 slices shown]
[im 1/15]
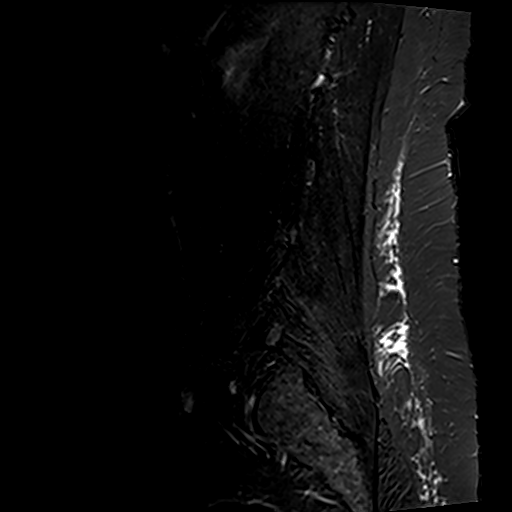
[im 3/15]
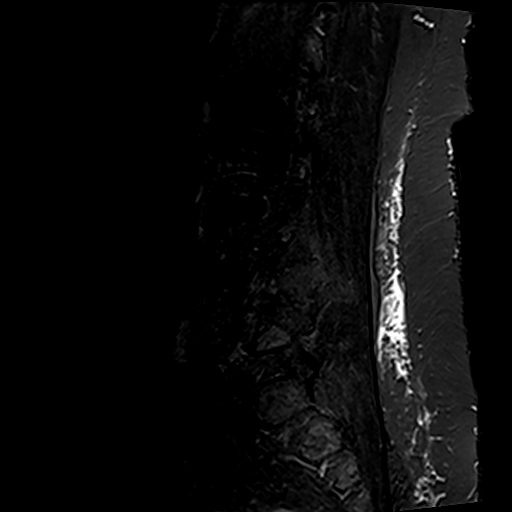

[Series 8: T2 · axial · 4.0mm · 0.70mm/px · z∈[-38,+132]mm · 8 of 31 slices shown (2 of 2)]
[im 1/31]
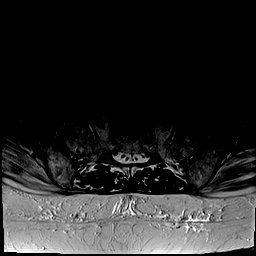
[im 5/31]
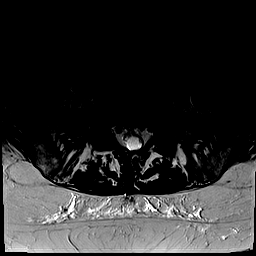
[im 10/31]
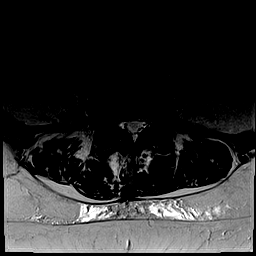
[im 14/31]
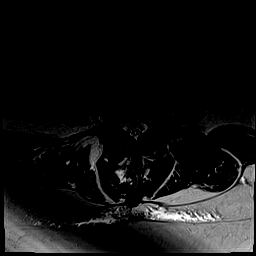
[im 17/31]
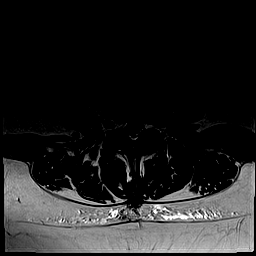
[im 21/31]
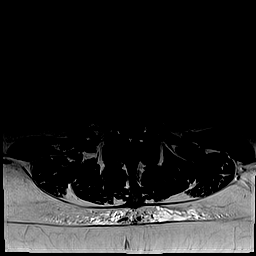
[im 26/31]
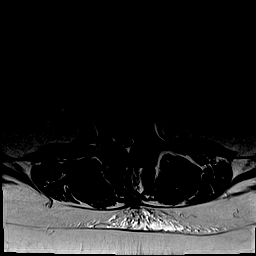
[im 31/31]
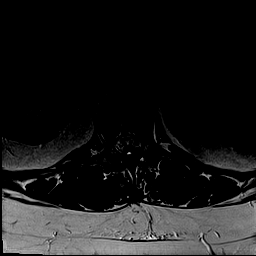

[Series 9: T1 · axial · 4.0mm · 0.35mm/px · z∈[-38,+132]mm · 8 of 31 slices shown (2 of 2)]
[im 1/31]
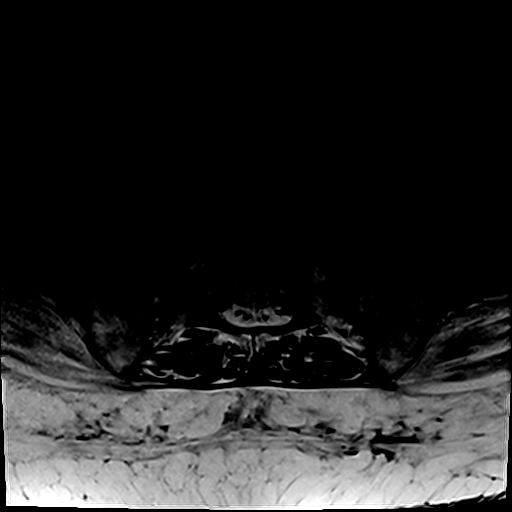
[im 5/31]
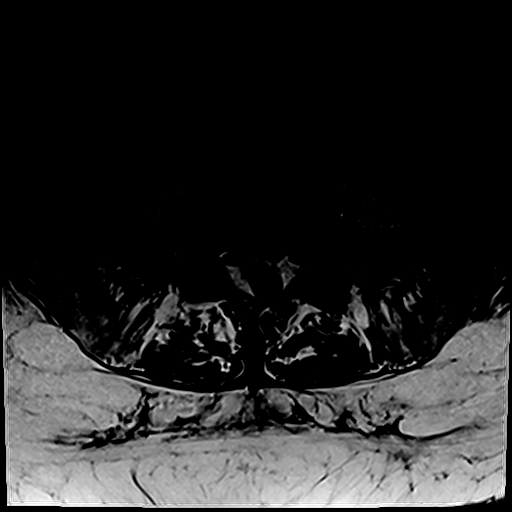
[im 10/31]
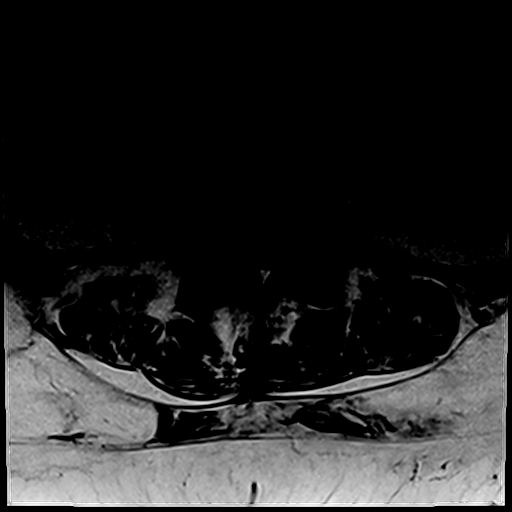
[im 14/31]
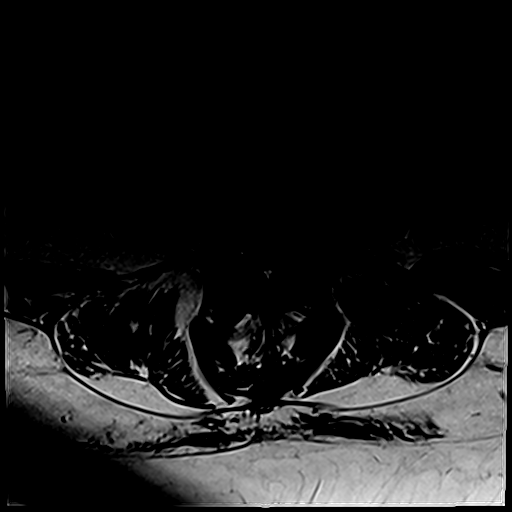
[im 17/31]
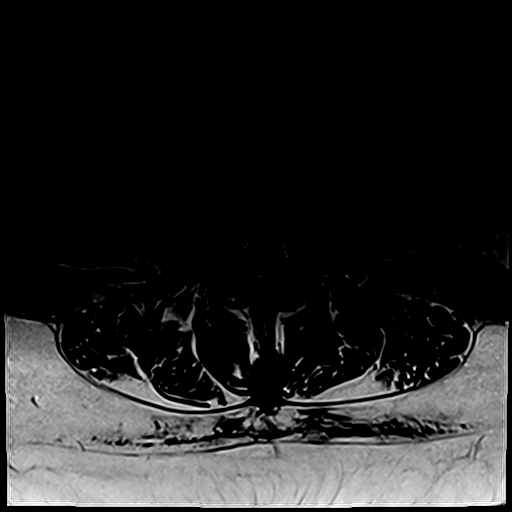
[im 21/31]
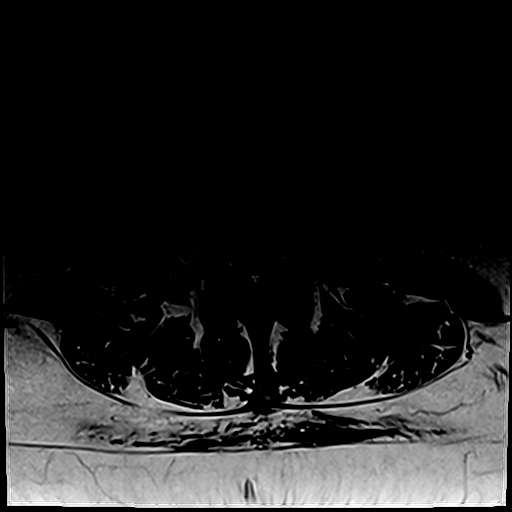
[im 26/31]
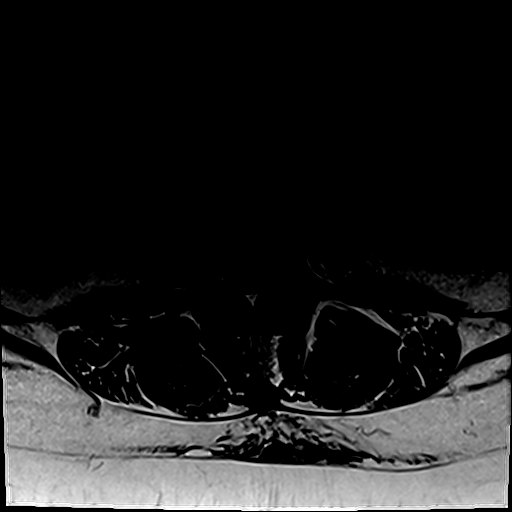
[im 31/31]
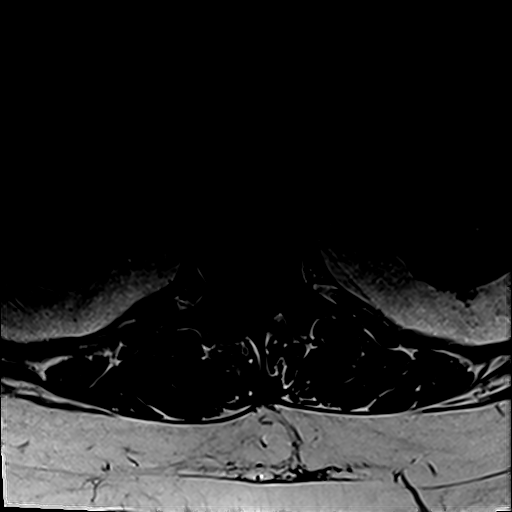

[31 of 48 positions shown; findings below may reference images not displayed]

FINDINGS: Segmentation:  Standard.

Alignment:  Physiologic.

Vertebrae:  No fracture, evidence of discitis, or bone lesion.

Conus medullaris and cauda equina: Conus extends to the L1 level.
Conus and cauda equina appear normal.

Paraspinal and other soft tissues: No acute paraspinal abnormality.

Disc levels:

Disc spaces: Disc desiccation throughout the lumbar spine. Mild disc
height loss at L4-5 with mild reactive endplate changes.

T12-L1: Minimal broad-based disc bulge. No foraminal or central
canal stenosis.

L1-L2: Mild broad-based disc bulge. Mild bilateral facet
arthropathy. No foraminal or central canal stenosis.

L2-L3: Mild broad-based disc bulge. Moderate bilateral facet
arthropathy with ligamentum flavum infolding. Moderate spinal
stenosis. No foraminal stenosis.

L3-L4: Mild broad-based disc bulge with a left far-lateral small
disc extrusion. Moderate bilateral facet arthropathy. Moderate
spinal stenosis. No foraminal stenosis.

L4-L5: Mild broad-based disc bulge. Moderate bilateral facet
arthropathy. Mild spinal stenosis. Mild right foraminal stenosis. No
left foraminal stenosis.

L5-S1: Mild broad-based disc bulge. Mild bilateral facet
arthropathy. No foraminal or central canal stenosis.
IMPRESSION: 1. Lumbar spine spondylosis as described above.
2. No acute osseous injury of the lumbar spine.

## 2022-04-04 DIAGNOSIS — L97511 Non-pressure chronic ulcer of other part of right foot limited to breakdown of skin: Secondary | ICD-10-CM | POA: Diagnosis not present

## 2022-04-09 ENCOUNTER — Other Ambulatory Visit (HOSPITAL_COMMUNITY): Payer: Self-pay | Admitting: Psychiatry

## 2022-04-11 DIAGNOSIS — I10 Essential (primary) hypertension: Secondary | ICD-10-CM | POA: Diagnosis not present

## 2022-04-11 DIAGNOSIS — J449 Chronic obstructive pulmonary disease, unspecified: Secondary | ICD-10-CM | POA: Diagnosis not present

## 2022-04-11 DIAGNOSIS — L97415 Non-pressure chronic ulcer of right heel and midfoot with muscle involvement without evidence of necrosis: Secondary | ICD-10-CM | POA: Diagnosis not present

## 2022-04-11 DIAGNOSIS — E1142 Type 2 diabetes mellitus with diabetic polyneuropathy: Secondary | ICD-10-CM | POA: Diagnosis not present

## 2022-04-11 DIAGNOSIS — L97421 Non-pressure chronic ulcer of left heel and midfoot limited to breakdown of skin: Secondary | ICD-10-CM | POA: Diagnosis not present

## 2022-04-11 DIAGNOSIS — E11621 Type 2 diabetes mellitus with foot ulcer: Secondary | ICD-10-CM | POA: Diagnosis not present

## 2022-04-17 ENCOUNTER — Ambulatory Visit (HOSPITAL_COMMUNITY)
Admission: RE | Admit: 2022-04-17 | Discharge: 2022-04-17 | Disposition: A | Discharge status: Home / Self Care | Payer: Medicare Other | Source: Ambulatory Visit | Attending: Neurosurgery | Admitting: Neurosurgery

## 2022-04-17 DIAGNOSIS — M5126 Other intervertebral disc displacement, lumbar region: Secondary | ICD-10-CM | POA: Diagnosis not present

## 2022-04-17 DIAGNOSIS — M5416 Radiculopathy, lumbar region: Secondary | ICD-10-CM | POA: Diagnosis not present

## 2022-04-17 DIAGNOSIS — M48061 Spinal stenosis, lumbar region without neurogenic claudication: Secondary | ICD-10-CM | POA: Diagnosis not present

## 2022-04-17 DIAGNOSIS — M5136 Other intervertebral disc degeneration, lumbar region: Secondary | ICD-10-CM | POA: Diagnosis not present

## 2022-04-17 MED ORDER — GADOBUTROL 1 MMOL/ML IV SOLN
10.0000 mL | Freq: Once | INTRAVENOUS | Status: AC | PRN
  Administered 2022-04-17: 10 mL via INTRAVENOUS

## 2022-04-18 DIAGNOSIS — L97415 Non-pressure chronic ulcer of right heel and midfoot with muscle involvement without evidence of necrosis: Secondary | ICD-10-CM | POA: Diagnosis not present

## 2022-04-18 DIAGNOSIS — I10 Essential (primary) hypertension: Secondary | ICD-10-CM | POA: Diagnosis not present

## 2022-04-18 DIAGNOSIS — E1142 Type 2 diabetes mellitus with diabetic polyneuropathy: Secondary | ICD-10-CM | POA: Diagnosis not present

## 2022-04-18 DIAGNOSIS — L97421 Non-pressure chronic ulcer of left heel and midfoot limited to breakdown of skin: Secondary | ICD-10-CM | POA: Diagnosis not present

## 2022-04-18 DIAGNOSIS — J449 Chronic obstructive pulmonary disease, unspecified: Secondary | ICD-10-CM | POA: Diagnosis not present

## 2022-04-18 DIAGNOSIS — E11621 Type 2 diabetes mellitus with foot ulcer: Secondary | ICD-10-CM | POA: Diagnosis not present

## 2022-04-22 DIAGNOSIS — M8580 Other specified disorders of bone density and structure, unspecified site: Secondary | ICD-10-CM | POA: Diagnosis not present

## 2022-04-22 DIAGNOSIS — M48062 Spinal stenosis, lumbar region with neurogenic claudication: Secondary | ICD-10-CM | POA: Diagnosis not present

## 2022-04-23 ENCOUNTER — Ambulatory Visit (HOSPITAL_COMMUNITY): Payer: Medicare Other | Admitting: Psychiatry

## 2022-04-24 DIAGNOSIS — N1832 Chronic kidney disease, stage 3b: Secondary | ICD-10-CM | POA: Diagnosis not present

## 2022-04-24 DIAGNOSIS — I5032 Chronic diastolic (congestive) heart failure: Secondary | ICD-10-CM | POA: Diagnosis not present

## 2022-04-24 DIAGNOSIS — I2510 Atherosclerotic heart disease of native coronary artery without angina pectoris: Secondary | ICD-10-CM | POA: Diagnosis not present

## 2022-04-24 DIAGNOSIS — E782 Mixed hyperlipidemia: Secondary | ICD-10-CM | POA: Diagnosis not present

## 2022-04-24 DIAGNOSIS — E039 Hypothyroidism, unspecified: Secondary | ICD-10-CM | POA: Diagnosis not present

## 2022-04-24 DIAGNOSIS — E1142 Type 2 diabetes mellitus with diabetic polyneuropathy: Secondary | ICD-10-CM | POA: Diagnosis not present

## 2022-04-24 DIAGNOSIS — E1122 Type 2 diabetes mellitus with diabetic chronic kidney disease: Secondary | ICD-10-CM | POA: Diagnosis not present

## 2022-04-24 DIAGNOSIS — D509 Iron deficiency anemia, unspecified: Secondary | ICD-10-CM | POA: Diagnosis not present

## 2022-04-24 DIAGNOSIS — G4733 Obstructive sleep apnea (adult) (pediatric): Secondary | ICD-10-CM | POA: Diagnosis not present

## 2022-04-24 DIAGNOSIS — F3181 Bipolar II disorder: Secondary | ICD-10-CM | POA: Diagnosis not present

## 2022-04-24 DIAGNOSIS — M545 Low back pain, unspecified: Secondary | ICD-10-CM | POA: Diagnosis not present

## 2022-04-30 DIAGNOSIS — B351 Tinea unguium: Secondary | ICD-10-CM | POA: Diagnosis not present

## 2022-04-30 DIAGNOSIS — M79676 Pain in unspecified toe(s): Secondary | ICD-10-CM | POA: Diagnosis not present

## 2022-04-30 DIAGNOSIS — L84 Corns and callosities: Secondary | ICD-10-CM | POA: Diagnosis not present

## 2022-04-30 DIAGNOSIS — E1142 Type 2 diabetes mellitus with diabetic polyneuropathy: Secondary | ICD-10-CM | POA: Diagnosis not present

## 2022-05-07 ENCOUNTER — Other Ambulatory Visit (INDEPENDENT_AMBULATORY_CARE_PROVIDER_SITE_OTHER): Payer: Self-pay | Admitting: Internal Medicine

## 2022-05-07 ENCOUNTER — Ambulatory Visit: Payer: Self-pay

## 2022-05-07 NOTE — Patient Outreach (Signed)
  Care Coordination   05/07/2022 Name: EVALINA TABAK MRN: [id number] DOB: 1950/12/21   Care Coordination Outreach Attempts:  An unsuccessful telephone outreach was attempted today to offer the patient information about available care coordination services as a benefit of their health plan.   Follow Up Plan:  Additional outreach attempts will be made to offer the patient care coordination information and services.   Encounter Outcome:  No Answer   Care Coordination Interventions:  No, not indicated    Jone Baseman, RN, MSN [facility] Care Management Coordinator Direct Line [phone]

## 2022-05-10 DIAGNOSIS — M8589 Other specified disorders of bone density and structure, multiple sites: Secondary | ICD-10-CM | POA: Diagnosis not present

## 2022-05-10 DIAGNOSIS — M8588 Other specified disorders of bone density and structure, other site: Secondary | ICD-10-CM | POA: Diagnosis not present

## 2022-05-14 ENCOUNTER — Telehealth (INDEPENDENT_AMBULATORY_CARE_PROVIDER_SITE_OTHER): Payer: Medicare Other | Admitting: Psychiatry

## 2022-05-14 ENCOUNTER — Encounter (HOSPITAL_COMMUNITY): Payer: Self-pay | Admitting: Psychiatry

## 2022-05-14 DIAGNOSIS — F3131 Bipolar disorder, current episode depressed, mild: Secondary | ICD-10-CM | POA: Diagnosis not present

## 2022-05-14 MED ORDER — ARIPIPRAZOLE 2 MG PO TABS: 2.0000 mg | ORAL_TABLET | Freq: Every day | ORAL | 2 refills | Status: DC

## 2022-05-14 MED ORDER — VENLAFAXINE HCL ER 150 MG PO CP24: ORAL_CAPSULE | ORAL | 2 refills | Status: DC

## 2022-05-14 MED ORDER — BUPROPION HCL ER (XL) 150 MG PO TB24: 150.0000 mg | ORAL_TABLET | Freq: Every morning | ORAL | 2 refills | Status: DC

## 2022-05-14 MED ORDER — ALPRAZOLAM 0.25 MG PO TABS: 0.2500 mg | ORAL_TABLET | Freq: Every evening | ORAL | 2 refills | Status: DC | PRN

## 2022-05-14 MED ORDER — LAMOTRIGINE 150 MG PO TABS: 150.0000 mg | ORAL_TABLET | Freq: Every day | ORAL | 2 refills | Status: DC

## 2022-05-14 NOTE — Progress Notes (Signed)
Virtual Visit via Telephone Note  I connected with Norma [name] on 05/14/22 at 11:20 AM EST by telephone and verified that I am speaking with the correct person using two identifiers.  Location: Patient: home Provider: office   I discussed the limitations, risks, security and privacy concerns of performing an evaluation and management service by telephone and the availability of in person appointments. I also discussed with the patient that there may be a patient responsible charge related to this service. The patient expressed understanding and agreed to proceed.      I discussed the assessment and treatment plan with the patient. The patient was provided an opportunity to ask questions and all were answered. The patient agreed with the plan and demonstrated an understanding of the instructions.   The patient was advised to call back or seek an in-person evaluation if the symptoms worsen or if the condition fails to improve as anticipated.  I provided 15 minutes of non-face-to-face time during this encounter.   [name], MD  [facility] MD/PA/NP OP Progress Note  05/14/2022 11:41 AM Norma [name]  MRN:  [id number]  Chief Complaint:  Chief Complaint  Patient presents with   Depression   Anxiety   Follow-up   HPI: This patient is a 72 year old white female who lives alone in [place].  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  She states she has been doing better overall since she has been put on Abilify a few months ago.  She is still having some back pain and knee pain but she tries to get around the best she can.  She denies significant depression anxiety thoughts of self-harm or suicide.  She is sleeping fairly well although some nights she sleeps better than others depending on her pain level.  She denies any manic symptoms or racing  thoughts Visit Diagnosis:    ICD-10-CM   1. Bipolar affective disorder, currently depressed, mild ([place])  F31.31       Past Psychiatric History:She was hospitalized in 2001 and went through a course of ECT.  She has been tried on [employer] Wellbutrin and Zyprexa    Past Medical History:  Past Medical History:  Diagnosis Date   Anemia    Anxiety    Bipolar disorder (HCC)    Bulging lumbar disc    L3-4   Chronic daily headache    Chronic low back pain 09/20/2014   CKD (chronic kidney disease)    stage 3   COPD (chronic obstructive pulmonary disease) (HCC)    Coronary artery disease    CABG 2019   Degenerative arthritis    Depression    Diabetes mellitus, type II (HCC)    Diabetic neuropathy ([place]) 09/16/2018   DM type 2 with diabetic peripheral neuropathy ([place]) 09/20/2014   Dyslipidemia    Dyspnea    Family history of adverse reaction to anesthesia    father had reaction to anesthesia medication per patient "they don't use it anymore"- unsure of reaction   Fatty liver    per pt report   Gastroparesis    GERD (gastroesophageal reflux disease)    Heart murmur    History of hiatal hernia    pt states she no longer has this   Hypothyroidism    IBS (irritable bowel syndrome)    LBBB (left bundle branch block)  Memory difficulty 09/20/2014   Morbid obesity ([place])    Myocardial infarction ([place])    per pt- prior to CABG   Neuropathy    Obstructive sleep apnea on CPAP    Restless legs syndrome (RLS) 09/17/2012   Stroke (cerebrum) ([place]) 05/16/2017   Left parietal   Urticaria     Past Surgical History:  Procedure Laterality Date   ABDOMINAL HYSTERECTOMY     ARTHROSCOPY KNEE W/ DRILLING  06/2011   and Dece[place] of 2013.   Carpel tunnel  1980's   CATARACT EXTRACTION W/PHACO Right 03/21/2017   Procedure: CATARACT EXTRACTION PHACO AND INTRAOCULAR LENS PLACEMENT RIGHT EYE;  Surgeon: Baruch Goldmann, MD;  Location: [facility];  Service: Ophthalmology;  Laterality: Right;  CDE:  2.91    CATARACT EXTRACTION W/PHACO Left 04/18/2017   Procedure: CATARACT EXTRACTION PHACO AND INTRAOCULAR LENS PLACEMENT LEFT EYE;  Surgeon: Baruch Goldmann, MD;  Location: [facility];  Service: Ophthalmology;  Laterality: Left;  left   CHOLECYSTECTOMY     COLONOSCOPY WITH PROPOFOL N/A 10/04/2016   Procedure: COLONOSCOPY WITH PROPOFOL;  Surgeon: Rogene Houston, MD;  [facility];  Service: Endoscopy;  Laterality: N/A;  11:10   CORONARY ARTERY BYPASS GRAFT N/A 08/29/2017   Procedure: CORONARY ARTERY BYPASS GRAFTING (CABG) x 3 WITH ENDOSCOPIC HARVESTING OF RIGHT SAPHENOUS VEIN;  Surgeon: Ivin Poot, MD;  Location: [place];  Service: Open Heart Surgery;  Laterality: N/A;   ESOPHAGOGASTRODUODENOSCOPY N/A 04/25/2016   Procedure: ESOPHAGOGASTRODUODENOSCOPY (EGD);  Surgeon: Rogene Houston, MD;  [facility];  Service: Endoscopy;  Laterality: N/A;  730   LEFT HEART CATH AND CORONARY ANGIOGRAPHY N/A 08/27/2017   Procedure: LEFT HEART CATH AND CORONARY ANGIOGRAPHY;  Surgeon: Sherren Mocha, MD;  Location: [facility];  Service: Cardiovascular::  pLAD 95% - p-mLAD 50%, ostD1 90% -pD1 80%; ostOM1 90%; rPDA 80%. EF ~50-55% - HK of dital Anterolateral & Apical wall.  - Rec CVTS c/s   NECK SURGERY     pt reports having growth removed from the back of her neck- after 2021   OPEN REDUCTION INTERNAL FIXATION (ORIF) HAND Left 04/10/2020   Procedure: OPEN REDUCTION INTERNAL FIXATION (ORIF) HAND, left index finger;  Surgeon: Carole Civil, MD;  Location: [facility];  Service: Orthopedics;  Laterality: Left;  0.045 k wires   RECTAL SURGERY     fissure   SHOULDER SURGERY Left    arthroscopy in March of this year   TEE WITHOUT CARDIOVERSION N/A 08/29/2017   Procedure: TRANSESOPHAGEAL ECHOCARDIOGRAM (TEE);  Surgeon: Prescott Gum, Collier Salina, MD;  Location: [place];  Service: Open Heart Surgery;  Laterality: N/A;   TRANSFORAMINAL LUMBAR INTERBODY FUSION W/ MIS 1 LEVEL Right 07/12/2021    Procedure: Minimally Invasive Surgery Transforaminal Lumbar Interbody Fusion  Lumbar four-five;  Surgeon: Vallarie Mare, MD;  Location: [facility];  Service: Neurosurgery;  Laterality: Right;   TRANSTHORACIC ECHOCARDIOGRAM  06/06/2017   Mild to moderate reduced EF 40 and 45%.  Anterior septal, inferoseptal and basal to mid inferior hypokinesis.  GR 1 DD.  No significant valvular lesion    Family Psychiatric History: See below  Family History:  Family History  Problem Relation Age of Onset   Hypertension Mother    Lymphoma Mother    Depression Mother    Arthritis Father    Alcohol abuse Father    Hypertension Sister    Cancer Brother        kidney and lung   Alcohol abuse  Brother    Alcohol abuse Paternal Uncle    Alcohol abuse Paternal Grandfather    Alcohol abuse Paternal Grandmother    Allergic rhinitis Neg Hx    Angioedema Neg Hx    Asthma Neg Hx    Atopy Neg Hx    Eczema Neg Hx    Immunodeficiency Neg Hx    Urticaria Neg Hx     Social History:  Social History   Socioeconomic History   Marital status: Single    Spouse name: Not on file   Number of children: 0   Years of education: 16   Highest education level: Not on file  Occupational History    Employer: [employer]  Tobacco Use   Smoking status: Former    Types: Cigarettes    Quit date: 02/04/1971    Years since quitting: 51.3   Smokeless tobacco: Never   Tobacco comments:    smoked 2 cigarettes a day  Vaping Use   Vaping Use: Never used  Substance and Sexual Activity   Alcohol use: No    Alcohol/week: 0.0 standard drinks of alcohol   Drug use: No   Sexual activity: Never  Other Topics Concern   Not on file  Social History Narrative   Patient lives at home alone.    Patient has no children.    Patient has her masters in nursing.    Patient is single.    Patient drinks about 2 glasses of tea daily.   Patient is right handed.   Social Determinants of Health   Financial Resource  Strain: Medium Risk (12/18/2021)   Overall Financial Resource Strain (CARDIA)    Difficulty of Paying Living Expenses: Somewhat hard  Food Insecurity: No Food Insecurity (12/18/2021)   Hunger Vital Sign    Worried About Running Out of Food in the Last Year: Never true    Ran Out of Food in the Last Year: Never true  Transportation Needs: No Transportation Needs (12/18/2021)   PRAPARE - Hydrologist (Medical): No    Lack of Transportation (Non-Medical): No  Physical Activity: Not on file  Stress: No Stress Concern Present (10/04/2021)   [place]    Feeling of Stress : Only a little  Social Connections: Not on file    Allergies:  Allergies  Allergen Reactions   Amoxicillin Anaphylaxis and Other (See Comments)    Has patient had a PCN reaction causing immediate rash, facial/tongue/throat swelling, SOB or lightheadedness with hypotension: Yes Has patient had a PCN reaction causing severe rash involving mucus membranes or skin necrosis: Yes Has patient had a PCN reaction that required hospitalization: Yes Has patient had a PCN reaction occurring within the last 10 years: Yes If all of the above answers are "NO", then may proceed with Cephalosporin use.    Hydrocodone Anaphylaxis   Percocet [Oxycodone-Acetaminophen] Other (See Comments)    Causes chest pain /tightness. Pressure pain around her ribs.   Vancomycin Itching   Acetaminophen    Depacon [Valproate Sodium]    Depacon [Valproic Acid] Other (See Comments)    Causes falls    Iron Nausea And Vomiting   Oxycodone    Oxycodone Hcl    Prednisone Itching    Metabolic Disorder Labs: Lab Results  Component Value Date   HGBA1C 7.4 (H) 07/05/2021   MPG 165.68 07/05/2021   MPG 148 03/05/2019   No results found for: "PROLACTIN" Lab Results  Component Value Date   CHOL 129 11/08/2019   TRIG 158 11/08/2019   HDL 60 11/08/2019   CHOLHDL  3.8 03/05/2019   VLDL 31 (H) 07/10/2016   LDLCALC 43 11/08/2019   LDLCALC 152 (H) 03/05/2019   Lab Results  Component Value Date   TSH 1.49 03/05/2019   TSH 2.671 10/30/2018    Therapeutic Level Labs: No results found for: "LITHIUM" No results found for: "VALPROATE" No results found for: "CBMZ"  Current Medications: Current Outpatient Medications  Medication Sig Dispense Refill   ALPRAZolam (XANAX) 0.25 MG tablet Take 1 tablet (0.25 mg total) by mouth at bedtime as needed for anxiety or sleep. 30 tablet 2   ARIPiprazole (ABILIFY) 2 MG tablet Take 1 tablet (2 mg total) by mouth daily. 30 tablet 2   aspirin EC 81 MG tablet Take 1 tablet (81 mg total) by mouth daily. 90 tablet 3   buPROPion (WELLBUTRIN XL) 150 MG 24 hr tablet Take 1 tablet (150 mg total) by mouth every morning. 30 tablet 2   carvedilol (COREG) 6.25 MG tablet Take 1 tablet (6.25 mg total) by mouth 2 (two) times daily with a meal. 180 tablet 2   cetirizine (ZYRTEC) 10 MG tablet Take 1 tablet tablet 1-2 times daily. (Patient taking differently: Take 10 mg by mouth at bedtime.) 60 tablet 5   diclofenac Sodium (VOLTAREN) 1 % GEL Apply 2 g topically 4 (four) times daily as needed (pain).     diphenhydrAMINE (BENADRYL) 25 mg capsule Take 25 mg by mouth as needed for allergies or itching.     empagliflozin (JARDIANCE) 10 MG TABS tablet Take 10 mg by mouth daily.     EPINEPHrine 0.3 mg/0.3 mL IJ SOAJ injection Inject 0.3 mg into the muscle once.     ferrous sulfate 325 (65 FE) MG EC tablet Take 325 mg by mouth. One daily     fluticasone (FLONASE) 50 MCG/ACT nasal spray Place 1 spray into both nostrils daily.     glipiZIDE (GLUCOTROL XL) 5 MG 24 hr tablet SMARTSIG:1 Tablet(s) By Mouth Morning-Evening     lamoTRIgine (LAMICTAL) 150 MG tablet Take 1 tablet (150 mg total) by mouth at bedtime. 30 tablet 2   levothyroxine (SYNTHROID) 112 MCG tablet Take 112 mcg by mouth daily before breakfast.     losartan (COZAAR) 50 MG tablet Take  50 mg by mouth every morning.     meclizine (ANTIVERT) 25 MG tablet Take 1 tablet (25 mg total) by mouth 3 (three) times daily as needed for dizziness. (Patient taking differently: Take 25 mg by mouth daily as needed for dizziness.) 30 tablet 0   metFORMIN (GLUCOPHAGE) 500 MG tablet TAKE ONE TABLET BY MOUTH TWICE DAILY WITH A MEAL (MORNING ,EVENING) (Patient taking differently: Take 500 mg by mouth 2 (two) times daily with a meal.) 180 tablet 0   ondansetron (ZOFRAN) 4 MG tablet Take 1 tablet (4 mg total) by mouth every 8 (eight) hours as needed for nausea or vomiting. 20 tablet 0   OVER THE COUNTER MEDICATION Take 1 tablet by mouth daily as needed (pain). CBD Gummies     pantoprazole (PROTONIX) 40 MG tablet Take 1 tablet by mouth daily before supper. 90 tablet 3   REPATHA SURECLICK 595 MG/ML SOAJ INJECT ONE DOSE INTO SKIN EVERY 14 DAYS. (Patient taking differently: Inject 140 mg into the skin every 14 (fourteen) days.) 2 mL 11   rOPINIRole (REQUIP) 3 MG tablet TAKE ONE TABLET BY MOUTH EVERY MORNING, ,NOON AND  AT BEDTIME. (PRT PT MORNING,EVENING,BEDTIME) (Patient taking differently: Take 3 mg by mouth 3 (three) times daily.) 270 tablet 3   traMADol (ULTRAM) 50 MG tablet Take 1 tablet (50 mg total) by mouth every 6 (six) hours as needed. 15 tablet 0   venlafaxine XR (EFFEXOR-XR) 150 MG 24 hr capsule TAKE TWO (2) CAPSULES BY MOUTH AT BEDTIME 60 capsule 2   Vitamin D, Ergocalciferol, (DRISDOL) 1.25 MG (50000 UNIT) CAPS capsule Take 50,000 Units by mouth every 7 (seven) days. Sunday     No current facility-administered medications for this visit.     Musculoskeletal: Strength & Muscle Tone: na Gait & Station: na Patient leans: N/A  Psychiatric Specialty Exam: Review of Systems  Musculoskeletal:  Positive for arthralgias, back pain, gait problem and joint swelling.  All other systems reviewed and are negative.   There were no vitals taken for this visit.There is no height or weight on file to  calculate BMI.  General Appearance: NA  Eye Contact:  NA  Speech:  Clear and Coherent  Volume:  Normal  Mood:  Euthymic  Affect:  NA  Thought Process:  Goal Directed  Orientation:  Full (Time, Place, and Person)  Thought Content: WDL   Suicidal Thoughts:  No  Homicidal Thoughts:  No  Memory:  Immediate;   Good Recent;   Good Remote;   Good  Judgement:  Good  Insight:  Good  Psychomotor Activity:  Decreased  Concentration:  Concentration: Good and Attention Span: Good  Recall:  Good  Fund of Knowledge: Good  Language: Good  Akathisia:  No  Handed:  Right  AIMS (if indicated): not done  Assets:  Communication Skills Desire for Improvement Resilience Social Support Talents/Skills  ADL's:  Intact  Cognition: WNL  Sleep:  Fair   Screenings: Mini-Mental    [place] Office Visit from 02/18/2018 in [facility] Office Visit from 09/17/2017 in [facility]  Total Score (max 30 points ) 28 28      PHQ2-9    Flowsheet Row Video Visit from 01/21/2022 in [facility] at [facility] Video Visit from 11/26/2021 in [place] at [facility] from 11/14/2021 in [facility] Patient Outreach Telephone from 10/04/2021 in [facility] Coord[facility] Video Visit from 08/30/2021 in [place] at [facility] Total Score '1 4 2 1 '$ 0  PHQ-9 Total Score -- 9 11 -- --      Flowsheet Row Video Visit from 01/21/2022 in [place] at [facility] [place] ED from 01/13/2022 in [facility] at [facility] Video Visit from 08/30/2021 in [place] at [facility] No Risk No Risk No Risk        Assessment and Plan: This patient is a 72 year old female with a history of  depression anxiety mood swings and insomnia. She is doing better with the addition of Abilify 2 mg for augmentation. She will continue this as well as Wellbutrin XL 150 mg daily as well as Effexor XR 300 mg daily for depression, Lamictal 100 mg daily for mood stabilization and Xanax 0.25 mg at bedtime as needed for anxiety and sleep. She will return to see me in 3 months   Collaboration of Care: Collaboration of Care: Primary Care Provider AEB notes will be shared with PCP at patient's request  Patient/Guardian was advised  Release of Information must be obtained prior to any record release in order to collaborate their care with an outside provider. Patient/Guardian was advised if they have not already done so to contact the registration department to sign all necessary forms in order for [place] to release information regarding their care.   Consent: Patient/Guardian gives verbal consent for treatment and assignment of benefits for services provided during this visit. Patient/Guardian expressed understanding and agreed to proceed.    [name], MD 05/14/2022, 11:41 AM

## 2022-05-15 ENCOUNTER — Ambulatory Visit: Payer: Medicare Other | Admitting: Cardiology

## 2022-05-17 ENCOUNTER — Encounter: Payer: Self-pay | Admitting: Nurse Practitioner

## 2022-05-17 ENCOUNTER — Ambulatory Visit: Payer: Medicare Other | Attending: Nurse Practitioner | Admitting: Nurse Practitioner

## 2022-05-17 VITALS — BP 106/74 | HR 78 | Ht 60.0 in | Wt 258.0 lb

## 2022-05-17 DIAGNOSIS — I25118 Atherosclerotic heart disease of native coronary artery with other forms of angina pectoris: Secondary | ICD-10-CM | POA: Insufficient documentation

## 2022-05-17 DIAGNOSIS — N1832 Chronic kidney disease, stage 3b: Secondary | ICD-10-CM | POA: Insufficient documentation

## 2022-05-17 DIAGNOSIS — E785 Hyperlipidemia, unspecified: Secondary | ICD-10-CM | POA: Insufficient documentation

## 2022-05-17 DIAGNOSIS — R0789 Other chest pain: Secondary | ICD-10-CM | POA: Diagnosis not present

## 2022-05-17 DIAGNOSIS — R0609 Other forms of dyspnea: Secondary | ICD-10-CM | POA: Diagnosis not present

## 2022-05-17 DIAGNOSIS — R0602 Shortness of breath: Secondary | ICD-10-CM

## 2022-05-17 DIAGNOSIS — I5032 Chronic diastolic (congestive) heart failure: Secondary | ICD-10-CM | POA: Insufficient documentation

## 2022-05-17 DIAGNOSIS — G4733 Obstructive sleep apnea (adult) (pediatric): Secondary | ICD-10-CM | POA: Diagnosis not present

## 2022-05-17 NOTE — Patient Instructions (Addendum)
Medication Instructions:  Your physician recommends that you continue on your current medications as directed. Please refer to the Current Medication list given to you today.  Labwork: BNP & BMET today  [facility]  Testing/Procedures: Your physician has requested that you have an echocardiogram. Echocardiography is a painless test that uses sound waves to create images of your heart. It provides your doctor with information about the size and shape of your heart and how well your heart's chambers and valves are working. This procedure takes approximately one hour. There are no restrictions for this procedure. Please do NOT wear cologne, perfume, aftershave, or lotions (deodorant is allowed). Please arrive 15 minutes prior to your appointment time.  Follow-Up: Your physician recommends that you schedule a follow-up appointment in: 1 month  Any Other Special Instructions Will Be Listed Below (If Applicable).  If you need a refill on your cardiac medications before your next appointment, please call your pharmacy.

## 2022-05-17 NOTE — Progress Notes (Signed)
Cardiology Office Note:    Date:  05/17/2022  ID:  Norma [name], DOB 1951/01/22, MRN [id number]  PCP:  Celene Squibb, MD   [facility] Cardiologist:  Carlyle Dolly, MD     Referring MD: Celene Squibb, MD   CC: Here for follow-up  History of Present Illness:    Norma [name] is a 72 y.o. female with a hx of the following:  CAD, s/p CABG Hyperlipidemia Chronic diastolic CHF Chronic left bundle branch block Type 2 diabetes CKD stage IIIb GERD OSA on CPAP COPD  Patient is a very pleasant 72 year old female with past medical history as mentioned above.  Underwent cardiac catheterization in 2019 for unstable angina.  Findings revealed D1 90%, LAD 95%, and RPDA 80%, as well as OM 90%.  Underwent CABG with sequential SVG to diag and LAD, SVG-OM.  Echocardiogram in 2022 showed normal EF.  Last seen by Dr. Carlyle Dolly on November 07, 2021.  She is compliant with her medications.  Was followed by lipid clinic and was tolerating Repatha well.  She had some recent atypical chest pain for a few days.  Dr. Harl Bowie stated we will continue to monitor.  No medication changes were made.  Was told to follow-up in 6 months.  Today she presents for 19-monthfollow-up.  She presents with multiple chief complaints including weight gain, shortness of breath, and chest pain. Since July 2023, she has gained 32 lbs. Sees Dr. AElsworth Soho Notes dyspnea on exertion that is chronic and states has been getting worse over time. Denies any association with chest pain. Says her "chest hurts all the time." Typically noticed with reaching for something or bending over. Says heat relieves the pain, described as a soreness. Does admit to 3 pillow use at night, but denies any PND, palpitations, syncope, presyncope, dizziness, swelling, acute bleeding, or claudication.   Past Medical History:  Diagnosis Date   Anemia    Anxiety    Bipolar disorder (HCC)    Bulging lumbar disc    L3-4   Chronic  daily headache    Chronic low back pain 09/20/2014   CKD (chronic kidney disease)    stage 3   COPD (chronic obstructive pulmonary disease) (HCC)    Coronary artery disease    CABG 2019   Degenerative arthritis    Depression    Diabetes mellitus, type II (HCC)    Diabetic neuropathy ([facility] 09/16/2018   DM type 2 with diabetic peripheral neuropathy ([facility] 09/20/2014   Dyslipidemia    Dyspnea    Family history of adverse reaction to anesthesia    father had reaction to anesthesia medication per patient "they don't use it anymore"- unsure of reaction   Fatty liver    per pt report   Gastroparesis    GERD (gastroesophageal reflux disease)    Heart murmur    History of hiatal hernia    pt states she no longer has this   Hypothyroidism    IBS (irritable bowel syndrome)    LBBB (left bundle branch block)    Memory difficulty 09/20/2014   Morbid obesity ([facility]    Myocardial infarction ([facility]    per pt- prior to CABG   Neuropathy    Obstructive sleep apnea on CPAP    Restless legs syndrome (RLS) 09/17/2012   Stroke (cerebrum) ([facility] 05/16/2017   Left parietal   Urticaria     Past Surgical History:  Procedure Laterality Date   ABDOMINAL HYSTERECTOMY  ARTHROSCOPY KNEE W/ DRILLING  06/2011   and Decemer of 2013.   Carpel tunnel  1980's   CATARACT EXTRACTION W/PHACO Right 03/21/2017   Procedure: CATARACT EXTRACTION PHACO AND INTRAOCULAR LENS PLACEMENT RIGHT EYE;  Surgeon: Baruch Goldmann, MD;  Location: [facility];  Service: Ophthalmology;  Laterality: Right;  CDE: 2.91    CATARACT EXTRACTION W/PHACO Left 04/18/2017   Procedure: CATARACT EXTRACTION PHACO AND INTRAOCULAR LENS PLACEMENT LEFT EYE;  Surgeon: Baruch Goldmann, MD;  Location: [facility];  Service: Ophthalmology;  Laterality: Left;  left   CHOLECYSTECTOMY     COLONOSCOPY WITH PROPOFOL N/A 10/04/2016   Procedure: COLONOSCOPY WITH PROPOFOL;  Surgeon: Rogene Houston, MD;  [facility];  Service: Endoscopy;  Laterality:  N/A;  11:10   CORONARY ARTERY BYPASS GRAFT N/A 08/29/2017   Procedure: CORONARY ARTERY BYPASS GRAFTING (CABG) x 3 WITH ENDOSCOPIC HARVESTING OF RIGHT SAPHENOUS VEIN;  Surgeon: Ivin Poot, MD;  Location: [place];  Service: Open Heart Surgery;  Laterality: N/A;   ESOPHAGOGASTRODUODENOSCOPY N/A 04/25/2016   Procedure: ESOPHAGOGASTRODUODENOSCOPY (EGD);  Surgeon: Rogene Houston, MD;  [facility];  Service: Endoscopy;  Laterality: N/A;  730   LEFT HEART CATH AND CORONARY ANGIOGRAPHY N/A 08/27/2017   Procedure: LEFT HEART CATH AND CORONARY ANGIOGRAPHY;  Surgeon: Sherren Mocha, MD;  Location: [place] [facility];  Service: Cardiovascular::  pLAD 95% - p-mLAD 50%, ostD1 90% -pD1 80%; ostOM1 90%; rPDA 80%. EF ~50-55% - HK of dital Anterolateral & Apical wall.  - Rec CVTS c/s   NECK SURGERY     pt reports having growth removed from the back of her neck- after 2021   OPEN REDUCTION INTERNAL FIXATION (ORIF) HAND Left 04/10/2020   Procedure: OPEN REDUCTION INTERNAL FIXATION (ORIF) HAND, left index finger;  Surgeon: Carole Civil, MD;  Location: [facility];  Service: Orthopedics;  Laterality: Left;  0.045 k wires   RECTAL SURGERY     fissure   SHOULDER SURGERY Left    arthroscopy in March of this year   TEE WITHOUT CARDIOVERSION N/A 08/29/2017   Procedure: TRANSESOPHAGEAL ECHOCARDIOGRAM (TEE);  Surgeon: Prescott Gum, Collier Salina, MD;  Location: [facility] [place];  Service: Open Heart Surgery;  Laterality: N/A;   TRANSFORAMINAL LUMBAR INTERBODY FUSION W/ MIS 1 LEVEL Right 07/12/2021   Procedure: Minimally Invasive Surgery Transforaminal Lumbar Interbody Fusion  Lumbar four-five;  Surgeon: Vallarie Mare, MD;  Location: [facility];  Service: Neurosurgery;  Laterality: Right;   TRANSTHORACIC ECHOCARDIOGRAM  06/06/2017   Mild to moderate reduced EF 40 and 45%.  Anterior septal, inferoseptal and basal to mid inferior hypokinesis.  GR 1 DD.  No significant valvular lesion    Current Medications: Current Meds   Medication Sig   ALPRAZolam (XANAX) 0.25 MG tablet Take 1 tablet (0.25 mg total) by mouth at bedtime as needed for anxiety or sleep.   ARIPiprazole (ABILIFY) 2 MG tablet Take 1 tablet (2 mg total) by mouth daily.   aspirin EC 81 MG tablet Take 1 tablet (81 mg total) by mouth daily.   buPROPion (WELLBUTRIN XL) 150 MG 24 hr tablet Take 1 tablet (150 mg total) by mouth every morning.   carvedilol (COREG) 6.25 MG tablet Take 1 tablet (6.25 mg total) by mouth 2 (two) times daily with a meal.   cetirizine (ZYRTEC) 10 MG tablet Take 1 tablet tablet 1-2 times daily. (Patient taking differently: Take 10 mg by mouth at bedtime.)   diclofenac Sodium (VOLTAREN) 1 % GEL Apply 2 g  topically 4 (four) times daily as needed (pain).   diphenhydrAMINE (BENADRYL) 25 mg capsule Take 25 mg by mouth as needed for allergies or itching.   empagliflozin (JARDIANCE) 10 MG TABS tablet Take 10 mg by mouth daily.   EPINEPHrine 0.3 mg/0.3 mL IJ SOAJ injection Inject 0.3 mg into the muscle once.   ferrous sulfate 325 (65 FE) MG EC tablet Take 325 mg by mouth. One daily   fluticasone (FLONASE) 50 MCG/ACT nasal spray Place 1 spray into both nostrils daily.   gabapentin (NEURONTIN) 400 MG capsule Take 400 mg by mouth in the morning. & 800 mg at bedtime   glipiZIDE (GLUCOTROL XL) 5 MG 24 hr tablet SMARTSIG:1 Tablet(s) By Mouth Morning-Evening   lamoTRIgine (LAMICTAL) 150 MG tablet Take 1 tablet (150 mg total) by mouth at bedtime.   levothyroxine (SYNTHROID) 112 MCG tablet Take 112 mcg by mouth daily before breakfast.   losartan (COZAAR) 50 MG tablet Take 50 mg by mouth every morning.   meclizine (ANTIVERT) 25 MG tablet Take 1 tablet (25 mg total) by mouth 3 (three) times daily as needed for dizziness. (Patient taking differently: Take 25 mg by mouth daily as needed for dizziness.)   metFORMIN (GLUCOPHAGE) 500 MG tablet TAKE ONE TABLET BY MOUTH TWICE DAILY WITH A MEAL (MORNING ,EVENING) (Patient taking differently: Take 500 mg by  mouth 2 (two) times daily with a meal.)   ondansetron (ZOFRAN) 4 MG tablet Take 1 tablet (4 mg total) by mouth every 8 (eight) hours as needed for nausea or vomiting.   OVER THE COUNTER MEDICATION Take 1 tablet by mouth daily as needed (pain). CBD Gummies   pantoprazole (PROTONIX) 40 MG tablet Take 1 tablet by mouth daily before supper.   REPATHA SURECLICK 637 MG/ML SOAJ INJECT ONE DOSE INTO SKIN EVERY 14 DAYS. (Patient taking differently: Inject 140 mg into the skin every 14 (fourteen) days.)   rOPINIRole (REQUIP) 3 MG tablet TAKE ONE TABLET BY MOUTH EVERY MORNING, ,NOON AND AT BEDTIME. (PRT PT MORNING,EVENING,BEDTIME) (Patient taking differently: Take 3 mg by mouth 3 (three) times daily.)   tiZANidine (ZANAFLEX) 2 MG tablet Take 2 mg by mouth every 8 (eight) hours as needed.   traMADol (ULTRAM) 50 MG tablet Take 1 tablet (50 mg total) by mouth every 6 (six) hours as needed.   venlafaxine XR (EFFEXOR-XR) 150 MG 24 hr capsule TAKE TWO (2) CAPSULES BY MOUTH AT BEDTIME   Vitamin D, Ergocalciferol, (DRISDOL) 1.25 MG (50000 UNIT) CAPS capsule Take 50,000 Units by mouth every 7 (seven) days. Sunday     Allergies:   Amoxicillin, Hydrocodone, Percocet [oxycodone-acetaminophen], Vancomycin, Acetaminophen, Depacon [valproate sodium], Depacon [valproic acid], Iron, Oxycodone, Oxycodone hcl, and Prednisone   Social History   Socioeconomic History   Marital status: Single    Spouse name: Not on file   Number of children: 0   Years of education: 16   Highest education level: Not on file  Occupational History    Employer: [employer]  Tobacco Use   Smoking status: Former    Types: Cigarettes    Quit date: 02/04/1971    Years since quitting: 51.3   Smokeless tobacco: Never   Tobacco comments:    smoked 2 cigarettes a day  Vaping Use   Vaping Use: Never used  Substance and Sexual Activity   Alcohol use: No    Alcohol/week: 0.0 standard drinks of alcohol   Drug use: No   Sexual  activity: Never  Other Topics Concern  Not on file  Social History Narrative   Patient lives at home alone.    Patient has no children.    Patient has her masters in nursing.    Patient is single.    Patient drinks about 2 glasses of tea daily.   Patient is right handed.   Social Determinants of Health   Financial Resource Strain: Medium Risk (12/18/2021)   Overall Financial Resource Strain (CARDIA)    Difficulty of Paying Living Expenses: Somewhat hard  Food Insecurity: No Food Insecurity (12/18/2021)   Hunger Vital Sign    Worried About Running Out of Food in the Last Year: Never true    Ran Out of Food in the Last Year: Never true  Transportation Needs: No Transportation Needs (12/18/2021)   PRAPARE - Hydrologist (Medical): No    Lack of Transportation (Non-Medical): No  Physical Activity: Not on file  Stress: No Stress Concern Present (10/04/2021)   [place]    Feeling of Stress : Only a little  Social Connections: Not on file     Family History: The patient's family history includes Alcohol abuse in her brother, father, paternal grandfather, paternal grandmother, and paternal uncle; Arthritis in her father; Cancer in her brother; Depression in her mother; Hypertension in her mother and sister; Lymphoma in her mother. There is no history of Allergic rhinitis, Angioedema, Asthma, Atopy, Eczema, Immunodeficiency, or Urticaria.  ROS:   Review of Systems  Constitutional: Negative.   Eyes: Negative.   Respiratory:  Positive for shortness of breath. Negative for cough, hemoptysis, sputum production and wheezing.   Cardiovascular:  Positive for chest pain. Negative for palpitations, orthopnea, claudication, leg swelling and PND.       See HPI.   Gastrointestinal: Negative.   Genitourinary: Negative.   Musculoskeletal: Negative.   Skin: Negative.   Neurological: Negative.    Endo/Heme/Allergies: Negative.   Psychiatric/Behavioral: Negative.      Please see the history of present illness.    All other systems reviewed and are negative.  EKGs/Labs/Other Studies Reviewed:    The following studies were reviewed today:   EKG:  EKG is ordered today.  The ekg ordered today demonstrates NSR, nonspecific intraventricular block, otherwise nothing acute.   Echocardiogram on 10/18/2020:   1. Markedly abnormal septal motion ? from BBB. Left ventricular ejection  fraction, by estimation, is 50 to 55%. The left ventricle has low normal  function. The left ventricle has no regional wall motion abnormalities.  Left ventricular diastolic  parameters were normal.   2. Right ventricular systolic function is normal. The right ventricular  size is normal.   3. The mitral valve is normal in structure. Trivial mitral valve  regurgitation. No evidence of mitral stenosis.   4. The aortic valve is tricuspid. There is mild calcification of the  aortic valve. Aortic valve regurgitation is not visualized. Mild aortic  valve sclerosis is present, with no evidence of aortic valve stenosis.   5. The inferior vena cava is normal in size with greater than 50%  respiratory variability, suggesting right atrial pressure of 3 mmHg.   Comparison(s): Echocardiogram done 02/03/18 showed an EF of 50-55%.  TEE on 08/30/2017: Aortic valve: The valve is trileaflet. No stenosis. No regurgitation.   Mitral valve: Mild regurgitation. Central jet.   Right ventricle: Normal cavity size, wall thickness and ejection  fraction.   Pericardium: Trivial pericardial effusion.   Tricuspid valve: No regurgitation  Pulmonic valve: No regurgitation by color doppler.   Left ventricle: Regional wall motion abnormalities present, dyskinetic  basal and apical septal wall, hypokinesis of lateral wall and inferoseptal  wall. Increased wall thickness. LVEF 35-40%.   No ASD/PFO present   Unable to rule  out thrombus in LAA but velocities make thrombus less  likely.    Post Bypass:   Tricuspid, Pulmonic, Mitral and Aortic valve unchanged. LVEF > 40% with  vasopressor support (CO > 3.2). RWMA's present but slightly improved. No  dissection present after aortic cannula removed.   Left heart cath on 08/27/2017: RPDA lesion is 80% stenosed. Ost 1st Mrg to 1st Mrg lesion is 90% stenosed. Prox LAD lesion is 95% stenosed. Prox LAD to Mid LAD lesion is 50% stenosed. Ost 1st Diag lesion is 90% stenosed. 1st Diag lesion is 80% stenosed. The left ventricular ejection fraction is 50-55% by visual estimate. There is mild left ventricular systolic dysfunction.   1.  Severe multivessel coronary artery disease with severe stenosis of the proximal LAD/first diagonal bifurcation, first obtuse marginal branch of the circumflex, and PDA branch of the RCA. 2.  Mild segmental LV systolic dysfunction with hypokinesis of the distal anterolateral and apical walls, LVEF estimated at 50 to 55%.   Recommendations: The patient has critical multivessel coronary artery disease with symptoms of unstable angina.  Considering her history of long-standing diabetes and multivessel disease involving the LAD/diagonal bifurcation, will consult cardiac surgery for consideration of CABG.  If she is felt to be a poor candidate for CABG because of morbid obesity and other comorbid medical problems, PCI would be a reasonable alternative.  If she requires treatment with PCI, I would treat the LAD/diagonal bifurcation and the severe stenosis in the large first OM branch.  Would likely treat her PDA lesion medically considering its relatively distal location in that vessel.  Myoview on 04/12/2016: Probable normal perfusion and mild soft tissue attenuation (breast) No significant ischemia or evidence for scar The left ventricular ejection fraction is normal (55-65%). Low risk scan.  Recent Labs: 07/05/2021: ALT 32 07/13/2021: BUN 18;  Creatinine, Ser 1.39; Hemoglobin 10.1; Platelets 229; Potassium 4.7; Sodium 134  Recent Lipid Panel    Component Value Date/Time   CHOL 129 11/08/2019 0000   TRIG 158 11/08/2019 0000   HDL 60 11/08/2019 0000   CHOLHDL 3.8 03/05/2019 1221   VLDL 31 (H) 07/10/2016 0934   LDLCALC 43 11/08/2019 0000   LDLCALC 152 (H) 03/05/2019 1221    Physical Exam:    VS:  BP 106/74   Pulse 78   Ht 5' (1.524 m)   Wt 258 lb (117 kg)   SpO2 95%   BMI 50.39 kg/m     Wt Readings from Last 3 Encounters:  05/17/22 258 lb (117 kg)  01/13/22 240 lb (108.9 kg)  12/04/21 231 lb 9.6 oz (105.1 kg)     GEN: Morbidly obese, 72 y.o. female in no acute distress HEENT: Normal NECK: No JVD; No carotid bruits CARDIAC: S1/S2, RRR, no murmurs, rubs, gallops; 2+ pulses RESPIRATORY:  Clear to auscultation without rales, wheezing or rhonchi  MUSCULOSKELETAL:  No edema; No deformity  SKIN: Warm and dry NEUROLOGIC:  Alert and oriented x 3 PSYCHIATRIC:  Normal affect   ASSESSMENT:    1. Chronic diastolic heart failure ([place])   2. DOE (dyspnea on exertion)   3. Atypical chest pain   4. Coronary artery disease involving native heart with other form of angina pectoris, unspecified vessel or  lesion type ([place])   5. Hyperlipidemia, unspecified hyperlipidemia type   6. Stage 3b chronic kidney disease (CKD) ([place])   7. OSA on CPAP   8. Morbid obesity, unspecified obesity type ([place])    PLAN:    In order of problems listed above:  Chronic diastolic CHF, DOE Does admit to worsening shortness of breath. Trying to limit diuretic use due to kidney function. Echocardiogram 10/2020 showed EF 50-55%. Euvolemic and well compensated on exam. Will update Echo. Continue current medication regimen. Will also obtain proBNP and BMET at this time to consider if GDMT needs to be titrated. Low sodium diet, fluid restriction <2L, and daily weights encouraged. Educated to contact our office for weight gain of 2 lbs overnight or 5 lbs in  one week. Heart healthy diet and regular cardiovascular exercise encouraged.   Atypical chest pain; CAD, s/p CABG Does admit to what sounds like MSK chest pain. Very atypical for CAD. She is s/p CABG in 2019. Will continue to monitor at this time. Continue ASA, carvedilol, losartan, and repatha. Heart healthy diet and regular cardiovascular exercise encouraged. Recommended Tylenol 1,000 mg BID for pain relief. ED precautions discussed.   HLD Recent lipid panel revealed LDL at 46. She is at goal. Continue current medication regimen. Heart healthy diet and regular cardiovascular exercise encouraged.   CKD stage 3b Stable kidney function 03/2022. Will repeat BMET at this time as mentioned above. Avoid nephrotoxic agents. She follows Dr. Theador Hawthorne (Nephrology). Continue to follow with PCP.  5. OSA on CPAP Encouraged continued compliance with CPAP. Continue to follow-up with Dr. Elsworth Soho  6. Morbid obesity BMI today 50.39. Weight loss via diet and exercise encouraged. Discussed the impact being overweight would have on cardiovascular risk.  7. Disposition: Follow-up with me in 1 month or sooner if anything changes.    Medication Adjustments/Labs and Tests Ordered: Current medicines are reviewed at length with the patient today.  Concerns regarding medicines are outlined above.  Orders Placed This Encounter  Procedures   Brain natriuretic peptide   Basic metabolic panel   EKG [id number]   ECHOCARDIOGRAM COMPLETE   No orders of the defined types were placed in this encounter.   Patient Instructions  Medication Instructions:  Your physician recommends that you continue on your current medications as directed. Please refer to the Current Medication list given to you today.  Labwork: BNP & BMET today  [facility]  Testing/Procedures: Your physician has requested that you have an echocardiogram. Echocardiography is a painless test that uses sound waves to create images of your heart. It  provides your doctor with information about the size and shape of your heart and how well your heart's chambers and valves are working. This procedure takes approximately one hour. There are no restrictions for this procedure. Please do NOT wear cologne, perfume, aftershave, or lotions (deodorant is allowed). Please arrive 15 minutes prior to your appointment time.  Follow-Up: Your physician recommends that you schedule a follow-up appointment in: 1 month  Any Other Special Instructions Will Be Listed Below (If Applicable).  If you need a refill on your cardiac medications before your next appointment, please call your pharmacy.   Signed, Finis Bud, NP  05/19/2022 6:09 PM    [place]

## 2022-05-31 ENCOUNTER — Other Ambulatory Visit: Payer: Self-pay | Admitting: Internal Medicine

## 2022-06-03 ENCOUNTER — Other Ambulatory Visit: Payer: Medicare Other

## 2022-06-03 DIAGNOSIS — R0602 Shortness of breath: Secondary | ICD-10-CM | POA: Diagnosis not present

## 2022-06-03 DIAGNOSIS — G4733 Obstructive sleep apnea (adult) (pediatric): Secondary | ICD-10-CM | POA: Diagnosis not present

## 2022-06-03 DIAGNOSIS — E1142 Type 2 diabetes mellitus with diabetic polyneuropathy: Secondary | ICD-10-CM | POA: Diagnosis not present

## 2022-06-03 DIAGNOSIS — E1129 Type 2 diabetes mellitus with other diabetic kidney complication: Secondary | ICD-10-CM | POA: Diagnosis not present

## 2022-06-03 DIAGNOSIS — R809 Proteinuria, unspecified: Secondary | ICD-10-CM | POA: Diagnosis not present

## 2022-06-03 DIAGNOSIS — Z6841 Body Mass Index (BMI) 40.0 and over, adult: Secondary | ICD-10-CM | POA: Diagnosis not present

## 2022-06-03 DIAGNOSIS — E611 Iron deficiency: Secondary | ICD-10-CM | POA: Diagnosis not present

## 2022-06-03 DIAGNOSIS — M5416 Radiculopathy, lumbar region: Secondary | ICD-10-CM | POA: Diagnosis not present

## 2022-06-03 DIAGNOSIS — I5042 Chronic combined systolic (congestive) and diastolic (congestive) heart failure: Secondary | ICD-10-CM | POA: Diagnosis not present

## 2022-06-03 DIAGNOSIS — N189 Chronic kidney disease, unspecified: Secondary | ICD-10-CM | POA: Diagnosis not present

## 2022-06-03 DIAGNOSIS — I129 Hypertensive chronic kidney disease with stage 1 through stage 4 chronic kidney disease, or unspecified chronic kidney disease: Secondary | ICD-10-CM | POA: Diagnosis not present

## 2022-06-03 DIAGNOSIS — E1122 Type 2 diabetes mellitus with diabetic chronic kidney disease: Secondary | ICD-10-CM | POA: Diagnosis not present

## 2022-06-03 DIAGNOSIS — I5032 Chronic diastolic (congestive) heart failure: Secondary | ICD-10-CM | POA: Diagnosis not present

## 2022-06-03 LAB — BASIC METABOLIC PANEL
BUN: 24 — AB (ref 4–21)
Creatinine: 1.4 — AB (ref 0.5–1.1)

## 2022-06-03 LAB — HEMOGLOBIN A1C: Hemoglobin A1C: 7.6

## 2022-06-04 ENCOUNTER — Ambulatory Visit: Payer: Self-pay | Admitting: *Deleted

## 2022-06-04 DIAGNOSIS — B351 Tinea unguium: Secondary | ICD-10-CM | POA: Diagnosis not present

## 2022-06-04 DIAGNOSIS — L84 Corns and callosities: Secondary | ICD-10-CM | POA: Diagnosis not present

## 2022-06-04 DIAGNOSIS — E1142 Type 2 diabetes mellitus with diabetic polyneuropathy: Secondary | ICD-10-CM | POA: Diagnosis not present

## 2022-06-04 DIAGNOSIS — M79676 Pain in unspecified toe(s): Secondary | ICD-10-CM | POA: Diagnosis not present

## 2022-06-04 LAB — BRAIN NATRIURETIC PEPTIDE: BNP: 57.6 pg/mL (ref 0.0–100.0)

## 2022-06-04 LAB — BASIC METABOLIC PANEL
BUN/Creatinine Ratio: 18 (ref 12–28)
BUN: 24 mg/dL (ref 8–27)
CO2: 20 mmol/L (ref 20–29)
Calcium: 9.4 mg/dL (ref 8.7–10.3)
Chloride: 100 mmol/L (ref 96–106)
Creatinine, Ser: 1.35 mg/dL — ABNORMAL HIGH (ref 0.57–1.00)
Glucose: 333 mg/dL — ABNORMAL HIGH (ref 70–99)
Potassium: 4.1 mmol/L (ref 3.5–5.2)
Sodium: 137 mmol/L (ref 134–144)
eGFR: 43 mL/min/{1.73_m2} — ABNORMAL LOW (ref >59)

## 2022-06-04 LAB — LAB REPORT - SCANNED
A1c: 7.6
EGFR: 43
EGFR: 45

## 2022-06-04 NOTE — Patient Outreach (Signed)
  Care Coordination   06/04/2022 Name: Norma [name] MRN: [id number] DOB: 20-May-1950   Care Coordination Outreach Attempts:  An unsuccessful telephone outreach was attempted today to offer the patient information about available care coordination services as a benefit of their health plan.   Follow Up Plan:  Additional outreach attempts will be made to offer the patient care coordination information and services.   Encounter Outcome:  No Answer   Care Coordination Interventions:  No, not indicated    Nial Hawe L. Lavina Hamman, RN, BSN, [place] Coordinator Office number [phone]

## 2022-06-06 DIAGNOSIS — Z6841 Body Mass Index (BMI) 40.0 and over, adult: Secondary | ICD-10-CM | POA: Diagnosis not present

## 2022-06-06 DIAGNOSIS — M5416 Radiculopathy, lumbar region: Secondary | ICD-10-CM | POA: Diagnosis not present

## 2022-06-10 ENCOUNTER — Ambulatory Visit (INDEPENDENT_AMBULATORY_CARE_PROVIDER_SITE_OTHER): Payer: Medicare Other | Admitting: Gastroenterology

## 2022-06-11 ENCOUNTER — Ambulatory Visit: Payer: Medicare Other | Attending: Nurse Practitioner

## 2022-06-11 ENCOUNTER — Encounter (INDEPENDENT_AMBULATORY_CARE_PROVIDER_SITE_OTHER): Payer: Self-pay

## 2022-06-11 ENCOUNTER — Other Ambulatory Visit (HOSPITAL_COMMUNITY): Payer: Self-pay | Admitting: Internal Medicine

## 2022-06-11 DIAGNOSIS — R0609 Other forms of dyspnea: Secondary | ICD-10-CM | POA: Insufficient documentation

## 2022-06-11 DIAGNOSIS — I5032 Chronic diastolic (congestive) heart failure: Secondary | ICD-10-CM | POA: Diagnosis not present

## 2022-06-11 DIAGNOSIS — Z1231 Encounter for screening mammogram for malignant neoplasm of breast: Secondary | ICD-10-CM

## 2022-06-11 DIAGNOSIS — Z6841 Body Mass Index (BMI) 40.0 and over, adult: Secondary | ICD-10-CM | POA: Diagnosis not present

## 2022-06-11 DIAGNOSIS — M5416 Radiculopathy, lumbar region: Secondary | ICD-10-CM | POA: Diagnosis not present

## 2022-06-11 LAB — ECHOCARDIOGRAM COMPLETE
AR max vel: 2.54 cm2
AV Peak grad: 10.6 mmHg
Ao pk vel: 1.63 m/s
Area-P 1/2: 2.99 cm2
Calc EF: 68.5 %
MV M vel: 2.67 m/s
MV Peak grad: 28.5 mmHg
S' Lateral: 3.5 cm
Single Plane A2C EF: 72.9 %
Single Plane A4C EF: 64.9 %

## 2022-06-11 MED ORDER — PERFLUTREN LIPID MICROSPHERE
1.0000 mL | INTRAVENOUS | Status: AC | PRN
  Administered 2022-06-11: 2 mL via INTRAVENOUS

## 2022-06-12 ENCOUNTER — Ambulatory Visit: Payer: Self-pay | Admitting: *Deleted

## 2022-06-13 ENCOUNTER — Ambulatory Visit: Payer: Self-pay | Admitting: *Deleted

## 2022-06-13 ENCOUNTER — Encounter: Payer: Self-pay | Admitting: Radiology

## 2022-06-13 DIAGNOSIS — M8580 Other specified disorders of bone density and structure, unspecified site: Secondary | ICD-10-CM | POA: Insufficient documentation

## 2022-06-13 NOTE — Patient Outreach (Signed)
  Care Coordination   06/13/2022 Name: Norma [name] MRN: [id number] DOB: Sep 27, 1950   Care Coordination Outreach Attempts:  A third unsuccessful outreach was attempted today to offer the patient with information about available care coordination services as a benefit of their health plan.   Follow Up Plan:  No further outreach attempts will be made at this time. We have been unable to contact the patient to offer or enroll patient in care coordination services  Encounter Outcome:  No Answer   Care Coordination Interventions:  No, not indicated    Baley Shands L. Lavina Hamman, RN, BSN, [place] Coordinator Office number [phone]

## 2022-06-14 ENCOUNTER — Telehealth: Payer: Self-pay | Admitting: Orthopedic Surgery

## 2022-06-14 ENCOUNTER — Telehealth: Payer: Self-pay | Admitting: *Deleted

## 2022-06-14 DIAGNOSIS — M5416 Radiculopathy, lumbar region: Secondary | ICD-10-CM | POA: Diagnosis not present

## 2022-06-14 DIAGNOSIS — Z6841 Body Mass Index (BMI) 40.0 and over, adult: Secondary | ICD-10-CM | POA: Diagnosis not present

## 2022-06-14 NOTE — Telephone Encounter (Signed)
Tried to call the patient, unable to leave a message, sent the patient a mychart message.  She is wanting an appointment with Dr. Aline Brochure for her knees and shoulder.

## 2022-06-14 NOTE — Progress Notes (Unsigned)
  Care Coordination Note  06/14/2022 Name: [name] MRN: [id number] DOB: 24-Apr-1950  Norma [name] is a 72 y.o. year old female who is a primary care patient of [name], Edwinna Areola, MD and is actively engaged with the care management team. I reached out to [name] by phone today to assist with re-scheduling a follow up visit with the RN Case Manager  Follow up plan: Unsuccessful telephone outreach attempt made.  [place]  Direct Dial: [phone]

## 2022-06-17 ENCOUNTER — Ambulatory Visit: Payer: Medicare Other | Attending: Nurse Practitioner | Admitting: Nurse Practitioner

## 2022-06-17 ENCOUNTER — Encounter: Payer: Self-pay | Admitting: Nurse Practitioner

## 2022-06-17 VITALS — BP 116/70 | HR 76 | Ht 60.0 in | Wt 259.8 lb

## 2022-06-17 DIAGNOSIS — R0789 Other chest pain: Secondary | ICD-10-CM | POA: Diagnosis not present

## 2022-06-17 DIAGNOSIS — G4733 Obstructive sleep apnea (adult) (pediatric): Secondary | ICD-10-CM

## 2022-06-17 DIAGNOSIS — R42 Dizziness and giddiness: Secondary | ICD-10-CM | POA: Diagnosis not present

## 2022-06-17 DIAGNOSIS — R0609 Other forms of dyspnea: Secondary | ICD-10-CM | POA: Diagnosis not present

## 2022-06-17 DIAGNOSIS — R0989 Other specified symptoms and signs involving the circulatory and respiratory systems: Secondary | ICD-10-CM

## 2022-06-17 DIAGNOSIS — I25118 Atherosclerotic heart disease of native coronary artery with other forms of angina pectoris: Secondary | ICD-10-CM

## 2022-06-17 DIAGNOSIS — N1832 Chronic kidney disease, stage 3b: Secondary | ICD-10-CM | POA: Diagnosis not present

## 2022-06-17 DIAGNOSIS — I5032 Chronic diastolic (congestive) heart failure: Secondary | ICD-10-CM | POA: Diagnosis not present

## 2022-06-17 DIAGNOSIS — E785 Hyperlipidemia, unspecified: Secondary | ICD-10-CM

## 2022-06-17 NOTE — Progress Notes (Signed)
Cardiology Office Note:    Date:  06/17/2022  ID:  Norma [name], DOB 04-05-1951, MRN [id number]  PCP:  Celene Squibb, [place] [facility] Cardiologist:  Carlyle Dolly, MD     Referring MD: Celene Squibb, MD   CC: Here for follow-up  History of Present Illness:    Norma [name] is a 72 y.o. female with a hx of the following:  CAD, s/p CABG Hyperlipidemia Chronic diastolic CHF Chronic left bundle branch block Type 2 diabetes CKD stage IIIb GERD OSA on CPAP COPD  Patient is a very pleasant 72 year old female with past medical history as mentioned above.  Underwent cardiac catheterization in 2019 for unstable angina.  Findings revealed D1 90%, LAD 95%, and RPDA 80%, as well as OM 90%.  Underwent CABG with sequential SVG to diag and LAD, SVG-OM.  Echocardiogram in 2022 showed normal EF.  TTE 05/2022 showed normal EF, no RWMA, normal PASP, mildly calcified AV, no significant valvular abnormalities.  Today she states her chest wall pain has resolved. Admits to atypical mid CP noted last week, occurred 3-4 times in a row, lasting only a few seconds in duration, not associated with exertion, denied radiation. Continues to note shortness of breath. Does admit to occasional lightheadedness, dizziness, not positional and denies any specific triggers. Denies any palpitations, syncope, presyncope, PND, swelling or significant weight changes, acute bleeding, or claudication.   Past Medical History:  Diagnosis Date   Anemia    Anxiety    Bipolar disorder ([place])    Bulging lumbar disc    L3-4   Chronic daily headache    Chronic low back pain 09/20/2014   CKD (chronic kidney disease)    stage 3   COPD (chronic obstructive pulmonary disease) (HCC)    Coronary artery disease    CABG 2019   Degenerative arthritis    Depression    Diabetes mellitus, type II ([place])    Diabetic neuropathy ([place]) 09/16/2018   DM type 2 with diabetic peripheral neuropathy ([place])  09/20/2014   Dyslipidemia    Dyspnea    Family history of adverse reaction to anesthesia    father had reaction to anesthesia medication per patient "they don't use it anymore"- unsure of reaction   Fatty liver    per pt report   Gastroparesis    GERD (gastroesophageal reflux disease)    Heart murmur    History of hiatal hernia    pt states she no longer has this   Hypothyroidism    IBS (irritable bowel syndrome)    LBBB (left bundle branch block)    Memory difficulty 09/20/2014   Morbid obesity ([place])    Myocardial infarction ([place])    per pt- prior to CABG   Neuropathy    Obstructive sleep apnea on CPAP    Restless legs syndrome (RLS) 09/17/2012   Stroke (cerebrum) ([place]) 05/16/2017   Left parietal   Urticaria     Past Surgical History:  Procedure Laterality Date   ABDOMINAL HYSTERECTOMY     ARTHROSCOPY KNEE W/ DRILLING  06/2011   and Dece[place] of 2013.   Carpel tunnel  1980's   CATARACT EXTRACTION W/PHACO Right 03/21/2017   Procedure: CATARACT EXTRACTION PHACO AND INTRAOCULAR LENS PLACEMENT RIGHT EYE;  Surgeon: Baruch Goldmann, MD;  Location: [facility];  Service: Ophthalmology;  Laterality: Right;  CDE: 2.91    CATARACT EXTRACTION W/PHACO Left 04/18/2017   Procedure: CATARACT EXTRACTION PHACO AND INTRAOCULAR LENS PLACEMENT LEFT EYE;  Surgeon: Baruch Goldmann, MD;  Location: [facility];  Service: Ophthalmology;  Laterality: Left;  left   CHOLECYSTECTOMY     COLONOSCOPY WITH PROPOFOL N/A 10/04/2016   Procedure: COLONOSCOPY WITH PROPOFOL;  Surgeon: Rogene Houston, MD;  [facility];  Service: Endoscopy;  Laterality: N/A;  11:10   CORONARY ARTERY BYPASS GRAFT N/A 08/29/2017   Procedure: CORONARY ARTERY BYPASS GRAFTING (CABG) x 3 WITH ENDOSCOPIC HARVESTING OF RIGHT SAPHENOUS VEIN;  Surgeon: Ivin Poot, MD;  Location: [facility];  Service: Open Heart Surgery;  Laterality: N/A;   ESOPHAGOGASTRODUODENOSCOPY N/A 04/25/2016   Procedure: ESOPHAGOGASTRODUODENOSCOPY (EGD);  Surgeon:  Rogene Houston, MD;  [facility];  Service: Endoscopy;  Laterality: N/A;  730   LEFT HEART CATH AND CORONARY ANGIOGRAPHY N/A 08/27/2017   Procedure: LEFT HEART CATH AND CORONARY ANGIOGRAPHY;  Surgeon: Sherren Mocha, MD;  Location: [facility];  Service: Cardiovascular::  pLAD 95% - p-mLAD 50%, ostD1 90% -pD1 80%; ostOM1 90%; rPDA 80%. EF ~50-55% - HK of dital Anterolateral & Apical wall.  - Rec CVTS c/s   NECK SURGERY     pt reports having growth removed from the back of her neck- after 2021   OPEN REDUCTION INTERNAL FIXATION (ORIF) HAND Left 04/10/2020   Procedure: OPEN REDUCTION INTERNAL FIXATION (ORIF) HAND, left index finger;  Surgeon: Carole Civil, MD;  Location: [facility];  Service: Orthopedics;  Laterality: Left;  0.045 k wires   RECTAL SURGERY     fissure   SHOULDER SURGERY Left    arthroscopy in March of this year   TEE WITHOUT CARDIOVERSION N/A 08/29/2017   Procedure: TRANSESOPHAGEAL ECHOCARDIOGRAM (TEE);  Surgeon: Prescott Gum, Collier Salina, MD;  Location: [facility];  Service: Open Heart Surgery;  Laterality: N/A;   TRANSFORAMINAL LUMBAR INTERBODY FUSION W/ MIS 1 LEVEL Right 07/12/2021   Procedure: Minimally Invasive Surgery Transforaminal Lumbar Interbody Fusion  Lumbar four-five;  Surgeon: Vallarie Mare, MD;  Location: [facility];  Service: Neurosurgery;  Laterality: Right;   TRANSTHORACIC ECHOCARDIOGRAM  06/06/2017   Mild to moderate reduced EF 40 and 45%.  Anterior septal, inferoseptal and basal to mid inferior hypokinesis.  GR 1 DD.  No significant valvular lesion    Current Medications: Current Meds  Medication Sig   ALPRAZolam (XANAX) 0.25 MG tablet Take 1 tablet (0.25 mg total) by mouth at bedtime as needed for anxiety or sleep.   ARIPiprazole (ABILIFY) 2 MG tablet Take 1 tablet (2 mg total) by mouth daily.   aspirin EC 81 MG tablet Take 1 tablet (81 mg total) by mouth daily.   buPROPion (WELLBUTRIN XL) 150 MG 24 hr tablet Take 1 tablet (150 mg total) by mouth  every morning.   carvedilol (COREG) 6.25 MG tablet Take 1 tablet (6.25 mg total) by mouth 2 (two) times daily with a meal.   cetirizine (ZYRTEC) 10 MG tablet Take 1 tablet tablet 1-2 times daily. (Patient taking differently: Take 10 mg by mouth at bedtime.)   diclofenac Sodium (VOLTAREN) 1 % GEL Apply 2 g topically 4 (four) times daily as needed (pain).   diphenhydrAMINE (BENADRYL) 25 mg capsule Take 25 mg by mouth as needed for allergies or itching.   empagliflozin (JARDIANCE) 10 MG TABS tablet Take 10 mg by mouth daily.   EPINEPHrine 0.3 mg/0.3 mL IJ SOAJ injection Inject 0.3 mg into the muscle once.   ferrous sulfate 325 (65 FE) MG EC tablet Take 325 mg by mouth. One daily   fluticasone (  FLONASE) 50 MCG/ACT nasal spray Place 1 spray into both nostrils daily.   gabapentin (NEURONTIN) 400 MG capsule Take 400 mg by mouth in the morning. & 800 mg at bedtime   glipiZIDE (GLUCOTROL XL) 5 MG 24 hr tablet SMARTSIG:1 Tablet(s) By Mouth Morning-Evening   lamoTRIgine (LAMICTAL) 150 MG tablet Take 1 tablet (150 mg total) by mouth at bedtime.   levothyroxine (SYNTHROID) 112 MCG tablet Take 112 mcg by mouth daily before breakfast.   losartan (COZAAR) 50 MG tablet Take 50 mg by mouth every morning.   meclizine (ANTIVERT) 25 MG tablet Take 1 tablet (25 mg total) by mouth 3 (three) times daily as needed for dizziness. (Patient taking differently: Take 25 mg by mouth daily as needed for dizziness.)   ondansetron (ZOFRAN) 4 MG tablet Take 1 tablet (4 mg total) by mouth every 8 (eight) hours as needed for nausea or vomiting.   OVER THE COUNTER MEDICATION Take 1 tablet by mouth daily as needed (pain). CBD Gummies   pantoprazole (PROTONIX) 40 MG tablet Take 1 tablet by mouth daily before supper.   REPATHA SURECLICK [id number] MG/ML SOAJ INJECT ONE DOSE INTO SKIN EVERY 14 DAYS.   rOPINIRole (REQUIP) 3 MG tablet TAKE ONE TABLET BY MOUTH EVERY MORNING, ,NOON AND AT BEDTIME. (PRT PT MORNING,EVENING,BEDTIME) (Patient taking  differently: Take 3 mg by mouth 3 (three) times daily.)   tiZANidine (ZANAFLEX) 2 MG tablet Take 2 mg by mouth 2 (two) times daily.   TOUJEO SOLOSTAR 300 UNIT/ML Solostar Pen Inject into the skin at bedtime.   traMADol (ULTRAM) 50 MG tablet Take 1 tablet (50 mg total) by mouth every 6 (six) hours as needed.   venlafaxine XR (EFFEXOR-XR) 150 MG 24 hr capsule TAKE TWO (2) CAPSULES BY MOUTH AT BEDTIME   Vitamin D, Ergocalciferol, (DRISDOL) 1.25 MG (50000 UNIT) CAPS capsule Take 50,000 Units by mouth every 7 (seven) days. Sunday     Allergies:   Amoxicillin, Hydrocodone, Percocet [oxycodone-acetaminophen], Vancomycin, Acetaminophen, Depacon [valproate sodium], Depacon [valproic acid], Iron, Oxycodone, Oxycodone hcl, and Prednisone   Social History   Socioeconomic History   Marital status: Single    Spouse name: Not on file   Number of children: 0   Years of education: 16   Highest education level: Not on file  Occupational History    Employer: [employer]  Tobacco Use   Smoking status: Former    Types: Cigarettes    Quit date: 02/04/1971    Years since quitting: 51.4   Smokeless tobacco: Never   Tobacco comments:    smoked 2 cigarettes a day  Vaping Use   Vaping Use: Never used  Substance and Sexual Activity   Alcohol use: No    Alcohol/week: 0.0 standard drinks of alcohol   Drug use: No   Sexual activity: Never  Other Topics Concern   Not on file  Social History Narrative   Patient lives at home alone.    Patient has no children.    Patient has her masters in nursing.    Patient is single.    Patient drinks about 2 glasses of tea daily.   Patient is right handed.   Social Determinants of Health   Financial Resource Strain: Medium Risk (12/18/2021)   Overall Financial Resource Strain (CARDIA)    Difficulty of Paying Living Expenses: Somewhat hard  Food Insecurity: No Food Insecurity (12/18/2021)   Hunger Vital Sign    Worried About Running Out of Food in the Last  Year: Never  true    Ran Out of Food in the Last Year: Never true  Transportation Needs: No Transportation Needs (12/18/2021)   PRAPARE - Hydrologist (Medical): No    Lack of Transportation (Non-Medical): No  Physical Activity: Not on file  Stress: No Stress Concern Present (10/04/2021)   [place]    Feeling of Stress : Only a little  Social Connections: Not on file     Family History: The patient's family history includes Alcohol abuse in her brother, father, paternal grandfather, paternal grandmother, and paternal uncle; Arthritis in her father; Cancer in her brother; Depression in her mother; Hypertension in her mother and sister; Lymphoma in her mother. There is no history of Allergic rhinitis, Angioedema, Asthma, Atopy, Eczema, Immunodeficiency, or Urticaria.  ROS:     Please see the history of present illness.    All other systems reviewed and are negative.  EKGs/Labs/Other Studies Reviewed:    The following studies were reviewed today:   EKG:  EKG is ordered today.  The ekg ordered today demonstrates NSR, nonspecific intraventricular block, otherwise nothing acute.   Echo on 06/11/2022:   1. Left ventricular ejection fraction, by estimation, is 55 to 60%. The  left ventricle has normal function. The left ventricle has no regional  wall motion abnormalities. Left ventricular diastolic parameters were  normal. The average left ventricular  global longitudinal strain is -23.2 %. The global longitudinal strain is  normal.   2. Right ventricular systolic function is normal. The right ventricular  size is normal. There is normal pulmonary artery systolic pressure. The  estimated right ventricular systolic pressure is [id number] mmHg.   3. The mitral valve is grossly normal. Trivial mitral valve  regurgitation.   4. The aortic valve is tricuspid. There is mild calcification of the  aortic  valve. Aortic valve regurgitation is not visualized. Aortic valve  sclerosis/calcification is present, without any evidence of aortic  stenosis.   5. The inferior vena cava is normal in size with greater than 50%  respiratory variability, suggesting right atrial pressure of 3 mmHg.   Comparison(s): Prior images reviewed side by side. LVEF normal range at  55-60%, septal motion consistent with LBBB.   Echocardiogram on 10/18/2020:   1. Markedly abnormal septal motion ? from BBB. Left ventricular ejection  fraction, by estimation, is 50 to 55%. The left ventricle has low normal  function. The left ventricle has no regional wall motion abnormalities.  Left ventricular diastolic  parameters were normal.   2. Right ventricular systolic function is normal. The right ventricular  size is normal.   3. The mitral valve is normal in structure. Trivial mitral valve  regurgitation. No evidence of mitral stenosis.   4. The aortic valve is tricuspid. There is mild calcification of the  aortic valve. Aortic valve regurgitation is not visualized. Mild aortic  valve sclerosis is present, with no evidence of aortic valve stenosis.   5. The inferior vena cava is normal in size with greater than 50%  respiratory variability, suggesting right atrial pressure of 3 mmHg.   Comparison(s): Echocardiogram done 02/03/18 showed an EF of 50-55%.  TEE on 08/30/2017: Aortic valve: The valve is trileaflet. No stenosis. No regurgitation.   Mitral valve: Mild regurgitation. Central jet.   Right ventricle: Normal cavity size, wall thickness and ejection  fraction.   Pericardium: Trivial pericardial effusion.   Tricuspid valve: No regurgitation   Pulmonic valve: No  regurgitation by color doppler.   Left ventricle: Regional wall motion abnormalities present, dyskinetic  basal and apical septal wall, hypokinesis of lateral wall and inferoseptal  wall. Increased wall thickness. LVEF 35-40%.   No ASD/PFO present    Unable to rule out thrombus in LAA but velocities make thrombus less  likely.    Post Bypass:   Tricuspid, Pulmonic, Mitral and Aortic valve unchanged. LVEF > 40% with  vasopressor support (CO > 3.2). RWMA's present but slightly improved. No  dissection present after aortic cannula removed.   Left heart cath on 08/27/2017: RPDA lesion is 80% stenosed. Ost 1st Mrg to 1st Mrg lesion is 90% stenosed. Prox LAD lesion is 95% stenosed. Prox LAD to Mid LAD lesion is 50% stenosed. Ost 1st Diag lesion is 90% stenosed. 1st Diag lesion is 80% stenosed. The left ventricular ejection fraction is 50-55% by visual estimate. There is mild left ventricular systolic dysfunction.   1.  Severe multivessel coronary artery disease with severe stenosis of the proximal LAD/first diagonal bifurcation, first obtuse marginal branch of the circumflex, and PDA branch of the RCA. 2.  Mild segmental LV systolic dysfunction with hypokinesis of the distal anterolateral and apical walls, LVEF estimated at 50 to 55%.   Recommendations: The patient has critical multivessel coronary artery disease with symptoms of unstable angina.  Considering her history of long-standing diabetes and multivessel disease involving the LAD/diagonal bifurcation, will consult cardiac surgery for consideration of CABG.  If she is felt to be a poor candidate for CABG because of morbid obesity and other comorbid medical problems, PCI would be a reasonable alternative.  If she requires treatment with PCI, I would treat the LAD/diagonal bifurcation and the severe stenosis in the large first OM branch.  Would likely treat her PDA lesion medically considering its relatively distal location in that vessel.  Myoview on 04/12/2016: Probable normal perfusion and mild soft tissue attenuation (breast) No significant ischemia or evidence for scar The left ventricular ejection fraction is normal (55-65%). Low risk scan.  Recent Labs: 07/05/2021: ALT  32 07/13/2021: Hemoglobin 10.1; Platelets 229 06/03/2022: BNP 57.6; BUN 24; Creatinine, Ser 1.35; Potassium 4.1; Sodium 137  Recent Lipid Panel    Component Value Date/Time   CHOL 129 11/08/2019 0000   TRIG 158 11/08/2019 0000   HDL 60 11/08/2019 0000   CHOLHDL 3.8 03/05/2019 1221   VLDL 31 (H) 07/10/2016 0934   LDLCALC 43 11/08/2019 0000   LDLCALC 152 (H) 03/05/2019 1221    Physical Exam:    VS:  BP 116/70   Pulse 76   Ht 5' (1.524 m)   Wt 259 lb 12.8 oz (117.8 kg)   SpO2 94%   BMI 50.74 kg/m     Wt Readings from Last 3 Encounters:  06/17/22 259 lb 12.8 oz (117.8 kg)  05/17/22 258 lb (117 kg)  01/13/22 240 lb (108.9 kg)     GEN: Morbidly obese, 73 y.o. female in no acute distress HEENT: Normal NECK: No JVD; R carotid bruit, no L carotid bruit CARDIAC: S1/S2, RRR, no murmurs, rubs, gallops; 2+ pulses RESPIRATORY:  Clear to auscultation without rales, wheezing or rhonchi  MUSCULOSKELETAL:  No edema; No deformity  SKIN: Warm and dry NEUROLOGIC:  Alert and oriented x 3 PSYCHIATRIC:  Normal affect   ASSESSMENT:    1. Chronic diastolic heart failure ([place])   2. DOE (dyspnea on exertion)   3. Atypical chest pain   4. Coronary artery disease involving native heart with other form  of angina pectoris, unspecified vessel or lesion type ([place])   5. Hyperlipidemia, unspecified hyperlipidemia type   6. Stage 3b chronic kidney disease (CKD) ([place])   7. OSA on CPAP   8. Morbid obesity, unspecified obesity type ([place])   9. Lightheadedness   10. Dizziness   11. Bruit    PLAN:    In order of problems listed above:  Chronic diastolic CHF, DOE Breathing stable. Recent Echo showed normal EF. Euvolemic and well compensated on exam. Continue current medication regimen, unable to increase GDMT with current BP. Low sodium diet, fluid restriction <2L, and daily weights encouraged. Educated to contact our office for weight gain of 2 lbs overnight or 5 lbs in one week. Heart healthy diet  and regular cardiovascular exercise encouraged.   Atypical chest pain; CAD, s/p CABG Symptoms very atypical for CAD. She is s/p CABG in 2019. Will continue to monitor at this time. Continue ASA, carvedilol, losartan, and repatha. Heart healthy diet and regular cardiovascular exercise encouraged. Previously recommended Tylenol 1,000 mg BID for pain relief. ED precautions discussed.   HLD Previous lipid panel revealed LDL at 46. She is at goal. Continue current medication regimen. Heart healthy diet and regular cardiovascular exercise encouraged.   CKD stage 3b Stable kidney function 05/2022. Avoid nephrotoxic agents. She follows Dr. Theador Hawthorne (Nephrology). Continue to follow with PCP.  5. OSA on CPAP Encouraged continued compliance with CPAP. Encouraged her to continue to follow-up with Dr. Elsworth Soho  6. Morbid obesity BMI today 50.74. Weight loss via diet and exercise encouraged. Discussed the impact being overweight would have on cardiovascular risk.  7. Lightheadedness, dizziness, right carotid bruit Chronic, stable. Denies any triggers, not positional. Right carotid bruit on exam. Will arrange carotid doppler at this time. Discussed conservative measures, and if symptoms do not improve by next OV, plan to arrange orthostatics. ED precautions discussed.  8. Disposition: Follow-up with me or APP in 3 months or sooner if anything changes.    Medication Adjustments/Labs and Tests Ordered: Current medicines are reviewed at length with the patient today.  Concerns regarding medicines are outlined above.  Orders Placed This Encounter  Procedures   VAS US CAROTID   No orders of the defined types were placed in this encounter.   Patient Instructions  Medication Instructions:  Your physician recommends that you continue on your current medications as directed. Please refer to the Current Medication list given to you today.   Labwork: none  Testing/Procedures: Your physician has requested  that you have a carotid duplex. This test is an ultrasound of the carotid arteries in your neck. It looks at blood flow through these arteries that supply the brain with blood. Allow one hour for this exam. There are no restrictions or special instructions.   Follow-Up:  Your physician recommends that you schedule a follow-up appointment in: 3 months  Any Other Special Instructions Will Be Listed Below (If Applicable).  If you need a refill on your cardiac medications before your next appointment, please call your pharmacy.    SignedFinis Bud, NP  06/17/2022 12:43 PM    [place]

## 2022-06-17 NOTE — Patient Instructions (Signed)
Medication Instructions:  Your physician recommends that you continue on your current medications as directed. Please refer to the Current Medication list given to you today.   Labwork: none  Testing/Procedures: Your physician has requested that you have a carotid duplex. This test is an ultrasound of the carotid arteries in your neck. It looks at blood flow through these arteries that supply the brain with blood. Allow one hour for this exam. There are no restrictions or special instructions.   Follow-Up:  Your physician recommends that you schedule a follow-up appointment in: 3 months  Any Other Special Instructions Will Be Listed Below (If Applicable).  If you need a refill on your cardiac medications before your next appointment, please call your pharmacy.

## 2022-06-19 ENCOUNTER — Ambulatory Visit (HOSPITAL_COMMUNITY): Payer: Medicare Other

## 2022-06-19 NOTE — Progress Notes (Signed)
  Care Coordination Note  06/19/2022 Name: Norma [name] MRN: [id number] DOB: 22-Feb-1951  Norma [name] is a 72 y.o. year old female who is a primary care patient of [name], Edwinna Areola, MD and is actively engaged with the care management team. I reached out to [name] by phone today to assist with re-scheduling a follow up visit with the RN Case Manager  Follow up plan: Unsuccessful telephone outreach attempt made. We have been unable to make contact with the patient for follow up.    [place]  Direct Dial: [phone]

## 2022-06-24 ENCOUNTER — Ambulatory Visit (INDEPENDENT_AMBULATORY_CARE_PROVIDER_SITE_OTHER): Payer: Medicare Other | Admitting: Orthopedic Surgery

## 2022-06-24 ENCOUNTER — Encounter: Payer: Self-pay | Admitting: Orthopedic Surgery

## 2022-06-24 VITALS — Ht 60.0 in | Wt 257.0 lb

## 2022-06-24 DIAGNOSIS — M1712 Unilateral primary osteoarthritis, left knee: Secondary | ICD-10-CM

## 2022-06-24 DIAGNOSIS — M1711 Unilateral primary osteoarthritis, right knee: Secondary | ICD-10-CM | POA: Diagnosis not present

## 2022-06-24 DIAGNOSIS — M7551 Bursitis of right shoulder: Secondary | ICD-10-CM

## 2022-06-24 DIAGNOSIS — Z6841 Body Mass Index (BMI) 40.0 and over, adult: Secondary | ICD-10-CM | POA: Diagnosis not present

## 2022-06-24 MED ORDER — METHYLPREDNISOLONE ACETATE 40 MG/ML IJ SUSP
40.0000 mg | Freq: Once | INTRAMUSCULAR | Status: AC
  Administered 2022-06-24: 40 mg via INTRA_ARTICULAR

## 2022-06-24 NOTE — Progress Notes (Signed)
Chief Complaint  Patient presents with   Knee Pain    73 year old female with bilateral knee pain chronic has osteoarthritis.  Her BMI is 50 she is not a surgical candidate for knee replacement she has severe arthritis on knee x-ray  She also has some right shoulder pain with pain when lying on her right side she does not report a decreased range of motion but does report some weakness   Ht 5' (1.524 m)   Wt 257 lb (116.6 kg)   BMI 50.19 kg/m  Ht 5' (1.524 m)   Wt 257 lb (116.6 kg)   BMI 50.19 kg/m   Right knee left knee  She has flexion contractures of both knees about 15 degrees most likely from using the walker chronically with a crouched gait related to the lumbar spinal stenosis the flexion arc is normal in both knees she is tender on the lateral compartments with some crepitation  Recommend  Encounter Diagnoses  Name Primary?   Primary osteoarthritis of left knee Yes   Primary osteoarthritis of right knee    Body mass index 50.0-59.9, adult (HCC)    Bursitis of right shoulder     Exercises right shoulder bilateral knee injection  Procedure note for bilateral knee injections  Procedure note left knee injection verbal consent was obtained to inject left knee joint  Timeout was completed to confirm the site of injection  The medications used were 40 mg depomedrol and 3 cc of 1% lidocaine  Anesthesia was provided by ethyl chloride and the skin was prepped with alcohol.  After cleaning the skin with alcohol a 20-gauge needle was used to inject the left knee joint. There were no complications. A sterile bandage was applied.   Procedure note right knee injection verbal consent was obtained to inject right knee joint  Timeout was completed to confirm the site of injection  The medications used were 40 mg depomedrol and 3 cc of 1% lidocaine  Anesthesia was provided by ethyl chloride and the skin was prepped with alcohol.  After cleaning the skin with alcohol a  20-gauge needle was used to inject the right knee joint. There were no complications. A sterile bandage was applied.

## 2022-06-25 ENCOUNTER — Telehealth: Payer: Self-pay | Admitting: "Endocrinology

## 2022-06-25 NOTE — Telephone Encounter (Signed)
Called pt to let her know we needed new referral.  Pt stated she would get PCP Dr. [name] to send over referral.

## 2022-06-25 NOTE — Telephone Encounter (Signed)
Pt stated she was having issues with blood sugar again.  Scheduled her for March 25th.  Does she need labs done and if so can you put those orders in?

## 2022-06-25 NOTE — Telephone Encounter (Signed)
Sorry, disregard, last appt was January, We will let pt know to get a referral thanks

## 2022-06-27 ENCOUNTER — Ambulatory Visit (HOSPITAL_COMMUNITY)
Admission: RE | Admit: 2022-06-27 | Discharge: 2022-06-27 | Disposition: A | Discharge status: Home / Self Care | Payer: Medicare Other | Source: Ambulatory Visit | Attending: Internal Medicine | Admitting: Internal Medicine

## 2022-06-27 DIAGNOSIS — Z1231 Encounter for screening mammogram for malignant neoplasm of breast: Secondary | ICD-10-CM | POA: Insufficient documentation

## 2022-07-02 DIAGNOSIS — Z6841 Body Mass Index (BMI) 40.0 and over, adult: Secondary | ICD-10-CM | POA: Diagnosis not present

## 2022-07-02 DIAGNOSIS — M5416 Radiculopathy, lumbar region: Secondary | ICD-10-CM | POA: Diagnosis not present

## 2022-07-04 ENCOUNTER — Encounter: Payer: Self-pay | Admitting: "Endocrinology

## 2022-07-04 ENCOUNTER — Telehealth: Payer: Self-pay | Admitting: *Deleted

## 2022-07-04 DIAGNOSIS — I129 Hypertensive chronic kidney disease with stage 1 through stage 4 chronic kidney disease, or unspecified chronic kidney disease: Secondary | ICD-10-CM | POA: Diagnosis not present

## 2022-07-04 DIAGNOSIS — E1122 Type 2 diabetes mellitus with diabetic chronic kidney disease: Secondary | ICD-10-CM | POA: Diagnosis not present

## 2022-07-04 DIAGNOSIS — E1129 Type 2 diabetes mellitus with other diabetic kidney complication: Secondary | ICD-10-CM | POA: Diagnosis not present

## 2022-07-04 DIAGNOSIS — Z6841 Body Mass Index (BMI) 40.0 and over, adult: Secondary | ICD-10-CM | POA: Diagnosis not present

## 2022-07-04 DIAGNOSIS — N189 Chronic kidney disease, unspecified: Secondary | ICD-10-CM | POA: Diagnosis not present

## 2022-07-04 DIAGNOSIS — I5042 Chronic combined systolic (congestive) and diastolic (congestive) heart failure: Secondary | ICD-10-CM | POA: Diagnosis not present

## 2022-07-04 DIAGNOSIS — G4733 Obstructive sleep apnea (adult) (pediatric): Secondary | ICD-10-CM | POA: Diagnosis not present

## 2022-07-04 DIAGNOSIS — R809 Proteinuria, unspecified: Secondary | ICD-10-CM | POA: Diagnosis not present

## 2022-07-04 DIAGNOSIS — E875 Hyperkalemia: Secondary | ICD-10-CM | POA: Diagnosis not present

## 2022-07-04 NOTE — Telephone Encounter (Signed)
Patient states she has been having some chest pains but cardiology doctor states it is not due to cardio issues. Patient having increased SOB upon exertion to/from bathroom etc.   Offered a visit at [facility], but patient does not have a ride to [place]. Scheduled appointment with Dr. Elsworth Soho next available (09/10/22) but wanted to discuss being seen sooner.  Please call and advise patient [phone]

## 2022-07-05 DIAGNOSIS — M5416 Radiculopathy, lumbar region: Secondary | ICD-10-CM | POA: Diagnosis not present

## 2022-07-05 DIAGNOSIS — Z6841 Body Mass Index (BMI) 40.0 and over, adult: Secondary | ICD-10-CM | POA: Diagnosis not present

## 2022-07-05 NOTE — Telephone Encounter (Signed)
ATC pt unable to LVM b/c box is full. IF she returns call please route her to me.

## 2022-07-08 ENCOUNTER — Ambulatory Visit: Payer: Medicare Other | Admitting: "Endocrinology

## 2022-07-09 DIAGNOSIS — L97521 Non-pressure chronic ulcer of other part of left foot limited to breakdown of skin: Secondary | ICD-10-CM | POA: Diagnosis not present

## 2022-07-10 DIAGNOSIS — Z6841 Body Mass Index (BMI) 40.0 and over, adult: Secondary | ICD-10-CM | POA: Diagnosis not present

## 2022-07-10 DIAGNOSIS — M5416 Radiculopathy, lumbar region: Secondary | ICD-10-CM | POA: Diagnosis not present

## 2022-07-11 DIAGNOSIS — M5416 Radiculopathy, lumbar region: Secondary | ICD-10-CM | POA: Diagnosis not present

## 2022-07-11 DIAGNOSIS — Z6841 Body Mass Index (BMI) 40.0 and over, adult: Secondary | ICD-10-CM | POA: Diagnosis not present

## 2022-07-11 IMAGING — US US RENAL
1 series · 14 of 25 positions shown · non-contrast
Comparison: None.

CLINICAL DATA: Stage 3 chronic kidney disease.

EXAM:
RENAL / URINARY TRACT ULTRASOUND COMPLETE

[Series 1: us renal · 14 of 66 slices shown]
[im 1/66]
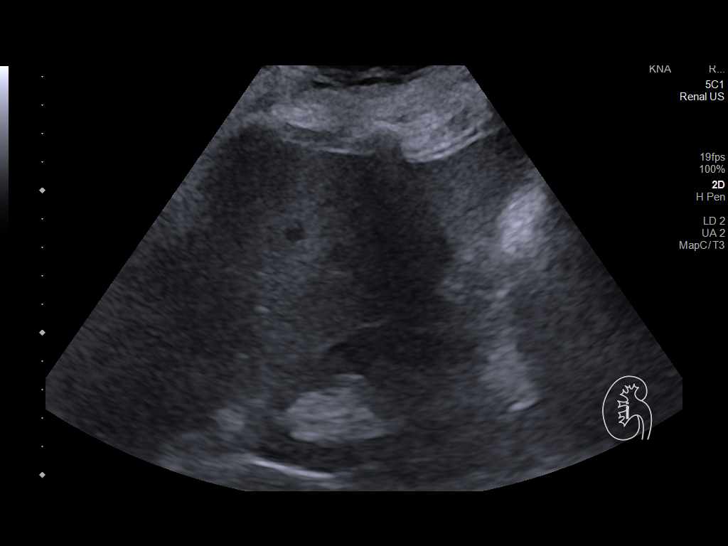
[im 6/66]
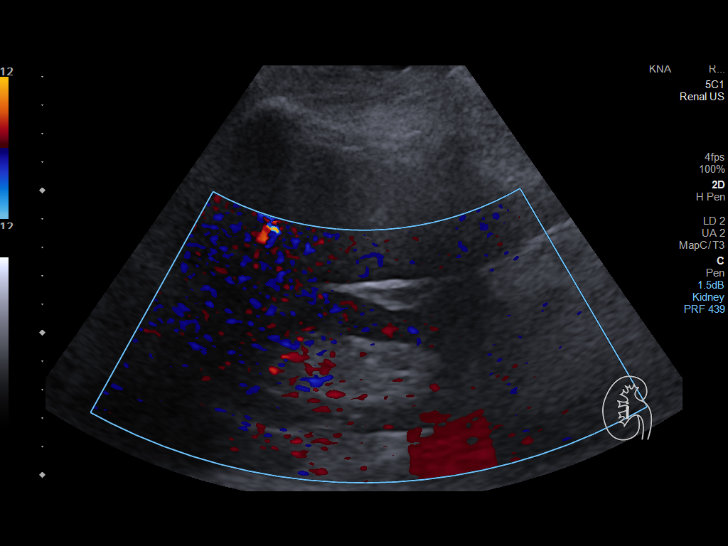
[im 11/66]
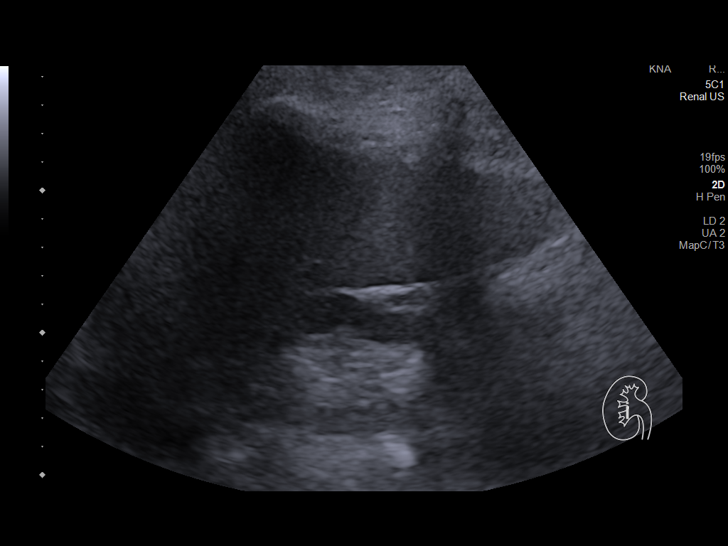
[im 17/66]
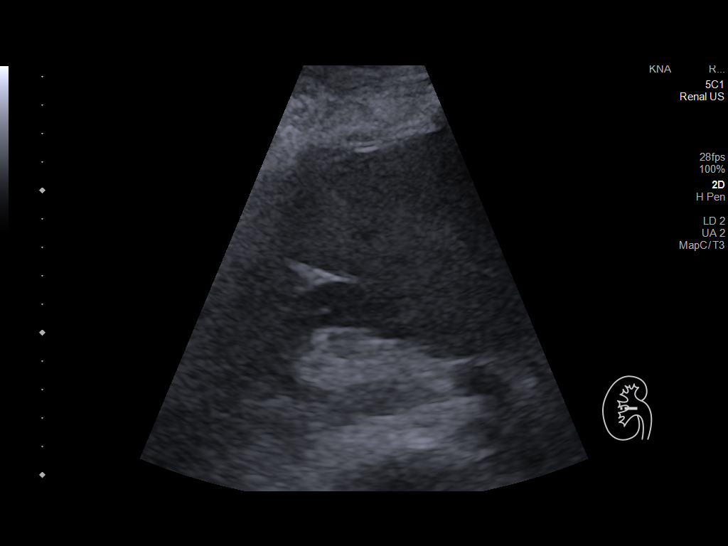
[im 22/66]
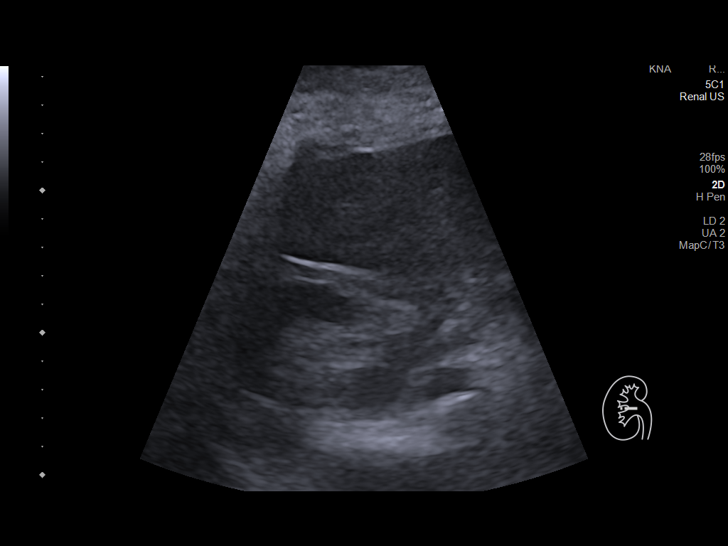
[im 25/66]
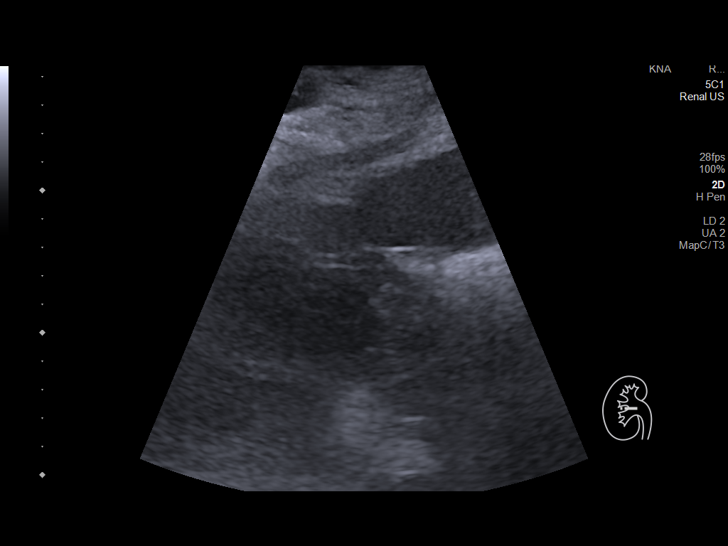
[im 30/66]
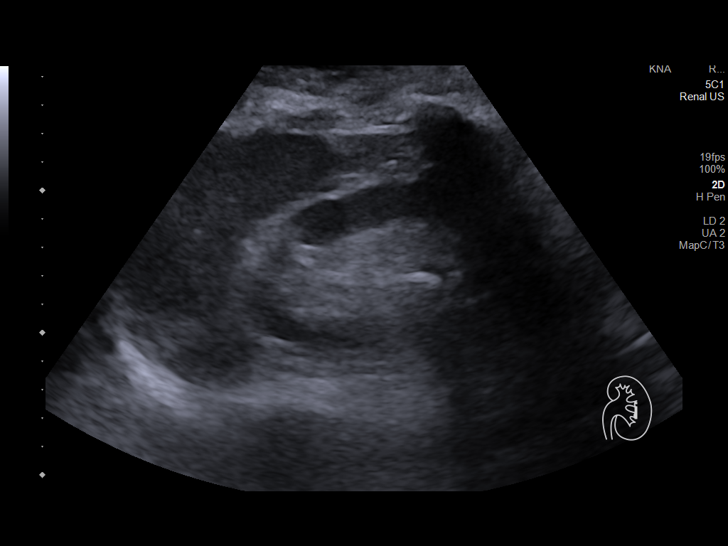
[im 36/66]
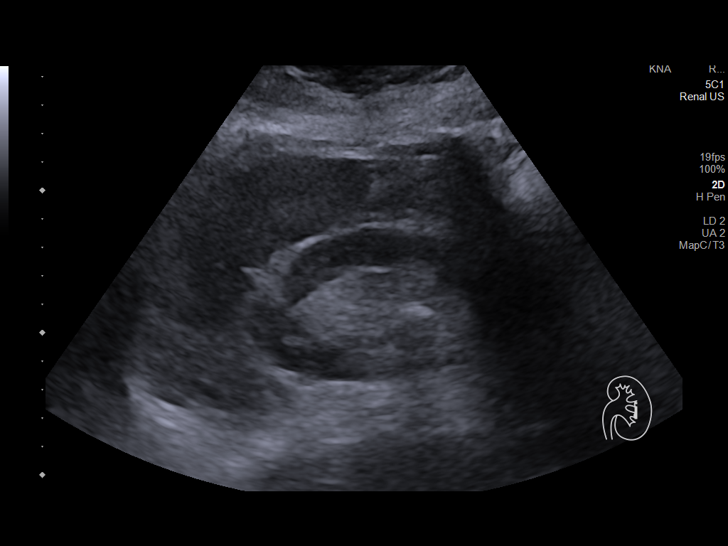
[im 41/66]
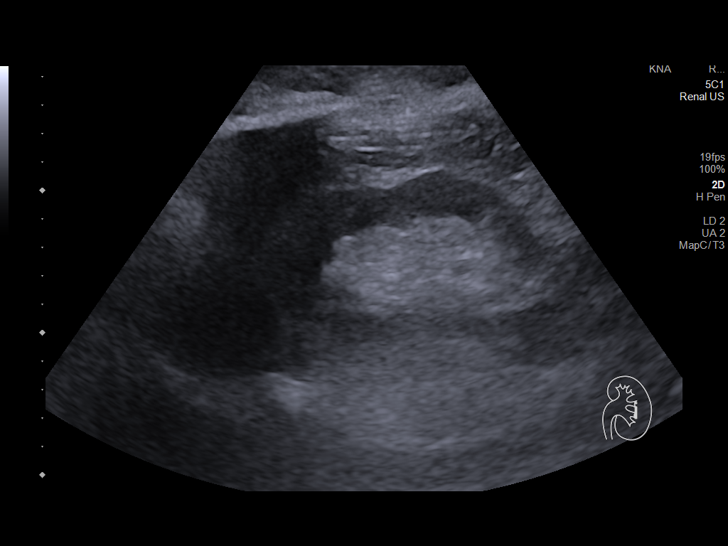
[im 44/66]
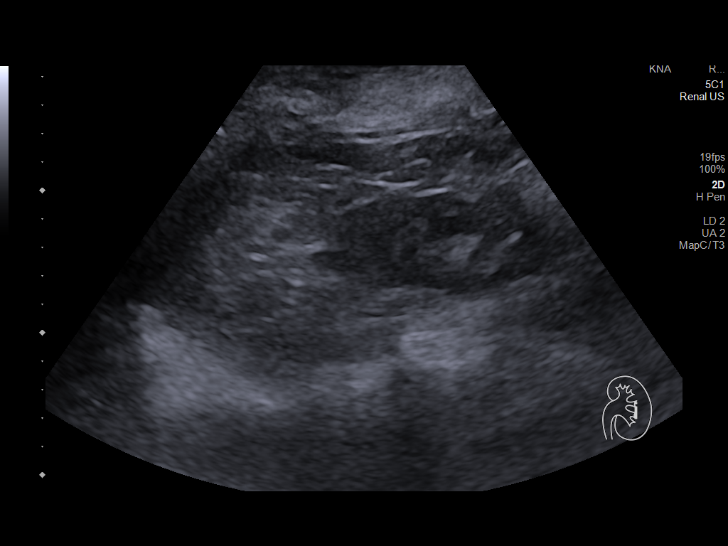
[im 49/66]
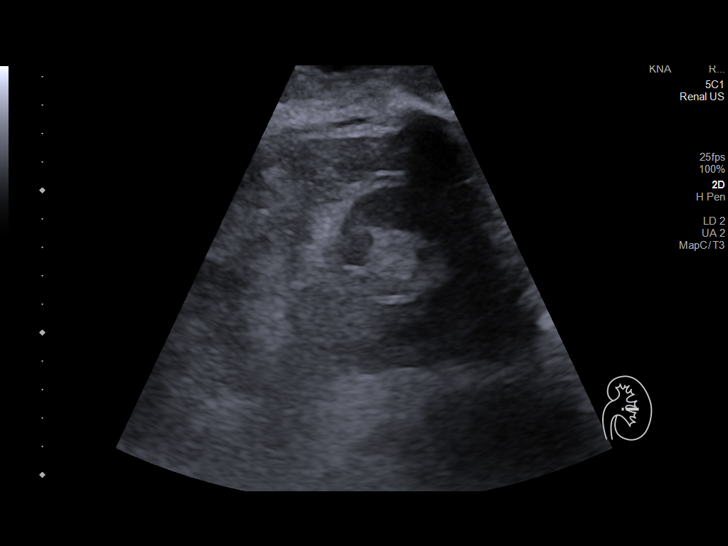
[im 55/66]
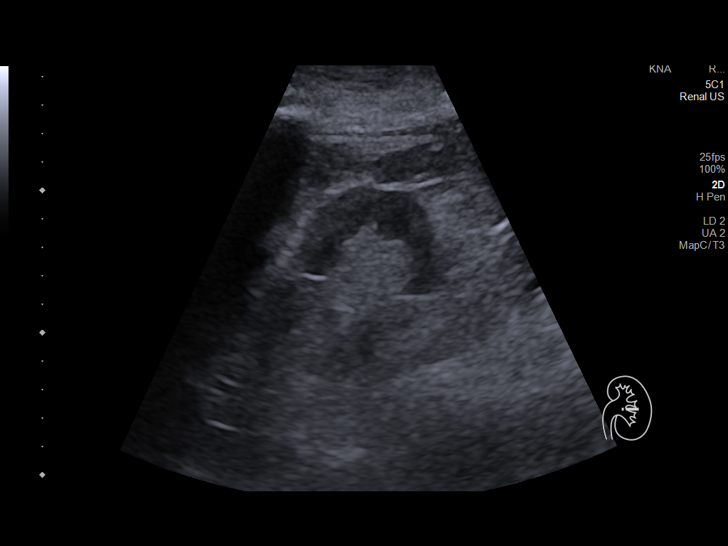
[im 60/66]
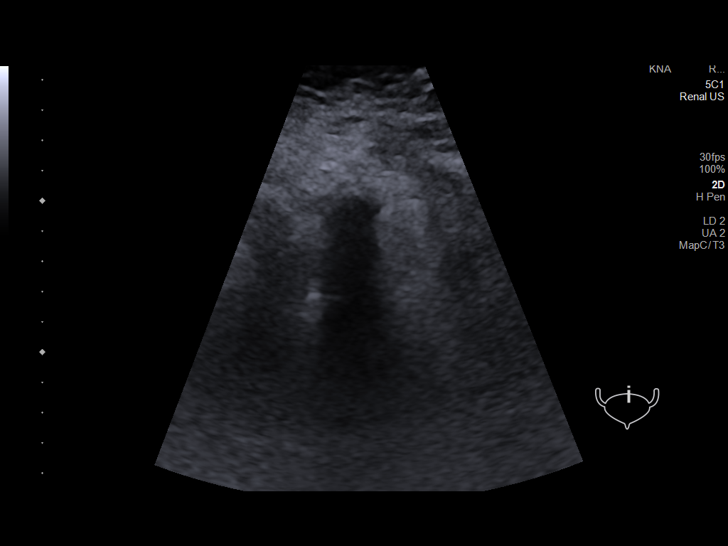
[im 66/66]
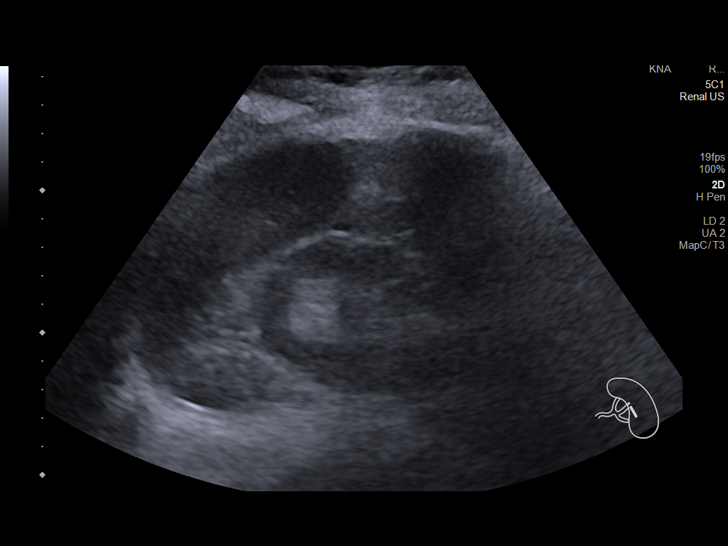

[14 of 25 positions shown; findings below may reference images not displayed]

FINDINGS: Right Kidney:

Renal measurements: 10.4 x 4.9 x 5.4 cm = volume: 143 mL.
Echogenicity within normal limits. No mass or hydronephrosis
visualized.

Left Kidney:

Renal measurements: 9.6 x 5.6 x 4.2 cm = volume: 121 mL. Normal
echogenicity. There is a 14 mm nonobstructing mid to lower pole
calculus. No hydronephrosis.

Bladder:

Appears normal for degree of bladder distention.

Other:

There is diffuse increased liver echogenicity most commonly seen in
the setting of fatty infiltration. Superimposed inflammation or
fibrosis is not excluded. Clinical correlation is recommended.
IMPRESSION: 1. A 14 mm nonobstructing left renal calculus.  No hydronephrosis.
2. Fatty liver.

## 2022-07-16 DIAGNOSIS — M5416 Radiculopathy, lumbar region: Secondary | ICD-10-CM | POA: Diagnosis not present

## 2022-07-16 DIAGNOSIS — Z6841 Body Mass Index (BMI) 40.0 and over, adult: Secondary | ICD-10-CM | POA: Diagnosis not present

## 2022-07-17 ENCOUNTER — Ambulatory Visit: Payer: Medicare Other | Attending: Nurse Practitioner

## 2022-07-17 DIAGNOSIS — R0989 Other specified symptoms and signs involving the circulatory and respiratory systems: Secondary | ICD-10-CM | POA: Diagnosis not present

## 2022-07-19 DIAGNOSIS — Z6841 Body Mass Index (BMI) 40.0 and over, adult: Secondary | ICD-10-CM | POA: Diagnosis not present

## 2022-07-19 DIAGNOSIS — M5416 Radiculopathy, lumbar region: Secondary | ICD-10-CM | POA: Diagnosis not present

## 2022-07-23 DIAGNOSIS — M5416 Radiculopathy, lumbar region: Secondary | ICD-10-CM | POA: Diagnosis not present

## 2022-07-23 DIAGNOSIS — Z6841 Body Mass Index (BMI) 40.0 and over, adult: Secondary | ICD-10-CM | POA: Diagnosis not present

## 2022-07-26 DIAGNOSIS — R070 Pain in throat: Secondary | ICD-10-CM | POA: Diagnosis not present

## 2022-07-26 DIAGNOSIS — J302 Other seasonal allergic rhinitis: Secondary | ICD-10-CM | POA: Diagnosis not present

## 2022-07-26 DIAGNOSIS — Z20822 Contact with and (suspected) exposure to covid-19: Secondary | ICD-10-CM | POA: Diagnosis not present

## 2022-07-26 DIAGNOSIS — R062 Wheezing: Secondary | ICD-10-CM | POA: Diagnosis not present

## 2022-07-30 DIAGNOSIS — M5416 Radiculopathy, lumbar region: Secondary | ICD-10-CM | POA: Diagnosis not present

## 2022-07-30 DIAGNOSIS — Z6841 Body Mass Index (BMI) 40.0 and over, adult: Secondary | ICD-10-CM | POA: Diagnosis not present

## 2022-07-31 ENCOUNTER — Ambulatory Visit (INDEPENDENT_AMBULATORY_CARE_PROVIDER_SITE_OTHER): Payer: Medicare Other | Admitting: "Endocrinology

## 2022-07-31 ENCOUNTER — Encounter: Payer: Self-pay | Admitting: "Endocrinology

## 2022-07-31 VITALS — BP 134/76 | HR 64 | Ht 60.0 in | Wt 264.8 lb

## 2022-07-31 DIAGNOSIS — E038 Other specified hypothyroidism: Secondary | ICD-10-CM | POA: Diagnosis not present

## 2022-07-31 DIAGNOSIS — E1159 Type 2 diabetes mellitus with other circulatory complications: Secondary | ICD-10-CM

## 2022-07-31 DIAGNOSIS — I10 Essential (primary) hypertension: Secondary | ICD-10-CM

## 2022-07-31 DIAGNOSIS — E785 Hyperlipidemia, unspecified: Secondary | ICD-10-CM

## 2022-07-31 DIAGNOSIS — E1169 Type 2 diabetes mellitus with other specified complication: Secondary | ICD-10-CM

## 2022-07-31 MED ORDER — EMPAGLIFLOZIN 10 MG PO TABS: 10.0000 mg | ORAL_TABLET | Freq: Every day | ORAL | 1 refills | Status: DC

## 2022-07-31 NOTE — Progress Notes (Signed)
Endocrinology Consult Note       08/01/2022, 2:51 PM   Subjective:    Patient ID: Norma [name], female    DOB: 23-Nov-1950.  Norma [name] is being seen in consultation for management of currently uncontrolled symptomatic diabetes requested by  [name], MD.   Past Medical History:  Diagnosis Date   Anemia    Anxiety    Bipolar disorder    Bulging lumbar disc    L3-4   Chronic daily headache    Chronic low back pain 09/20/2014   CKD (chronic kidney disease)    stage 3   COPD (chronic obstructive pulmonary disease)    Coronary artery disease    CABG 2019   Degenerative arthritis    Depression    Diabetes mellitus, type II    Diabetic neuropathy 09/16/2018   DM type 2 with diabetic peripheral neuropathy 09/20/2014   Dyslipidemia    Dyspnea    Family history of adverse reaction to anesthesia    father had reaction to anesthesia medication per patient "they don't use it anymore"- unsure of reaction   Fatty liver    per pt report   Gastroparesis    GERD (gastroesophageal reflux disease)    Heart murmur    History of hiatal hernia    pt states she no longer has this   Hypothyroidism    IBS (irritable bowel syndrome)    LBBB (left bundle branch block)    Memory difficulty 09/20/2014   Morbid obesity    Myocardial infarction    per pt- prior to CABG   Neuropathy    Obstructive sleep apnea on CPAP    Restless legs syndrome (RLS) 09/17/2012   Stroke (cerebrum) 05/16/2017   Left parietal   Urticaria     Past Surgical History:  Procedure Laterality Date   ABDOMINAL HYSTERECTOMY     ARTHROSCOPY KNEE W/ DRILLING  06/2011   and Decemer of 2013.   Carpel tunnel  1980's   CATARACT EXTRACTION W/PHACO Right 03/21/2017   Procedure: CATARACT EXTRACTION PHACO AND INTRAOCULAR LENS PLACEMENT RIGHT EYE;  Surgeon: Fabio Pierce, MD;  Location: [facility];  Service: Ophthalmology;  Laterality:  Right;  CDE: 2.91    CATARACT EXTRACTION W/PHACO Left 04/18/2017   Procedure: CATARACT EXTRACTION PHACO AND INTRAOCULAR LENS PLACEMENT LEFT EYE;  Surgeon: Fabio Pierce, MD;  Location: [facility];  Service: Ophthalmology;  Laterality: Left;  left   CHOLECYSTECTOMY     COLONOSCOPY WITH PROPOFOL N/A 10/04/2016   Procedure: COLONOSCOPY WITH PROPOFOL;  Surgeon: Malissa Hippo, MD;  Location: [facility];  Service: Endoscopy;  Laterality: N/A;  11:10   CORONARY ARTERY BYPASS GRAFT N/A 08/29/2017   Procedure: CORONARY ARTERY BYPASS GRAFTING (CABG) x 3 WITH ENDOSCOPIC HARVESTING OF RIGHT SAPHENOUS VEIN;  Surgeon: Kerin Perna, MD;  Location: [facility] OR;  Service: Open Heart Surgery;  Laterality: N/A;   ESOPHAGOGASTRODUODENOSCOPY N/A 04/25/2016   Procedure: ESOPHAGOGASTRODUODENOSCOPY (EGD);  Surgeon: Malissa Hippo, MD;  Location: [facility];  Service: Endoscopy;  Laterality: N/A;  730   LEFT HEART CATH AND CORONARY ANGIOGRAPHY N/A 08/27/2017  Procedure: LEFT HEART CATH AND CORONARY ANGIOGRAPHY;  Surgeon: Tonny Bollman, MD;  Location: [facility] INVASIVE CV LAB;  Service: Cardiovascular::  pLAD 95% - p-mLAD 50%, ostD1 90% -pD1 80%; ostOM1 90%; rPDA 80%. EF ~50-55% - HK of dital Anterolateral & Apical wall.  - Rec CVTS c/s   NECK SURGERY     pt reports having growth removed from the back of her neck- after 2021   OPEN REDUCTION INTERNAL FIXATION (ORIF) HAND Left 04/10/2020   Procedure: OPEN REDUCTION INTERNAL FIXATION (ORIF) HAND, left index finger;  Surgeon: Vickki Hearing, MD;  Location: [facility];  Service: Orthopedics;  Laterality: Left;  0.045 k wires   RECTAL SURGERY     fissure   SHOULDER SURGERY Left    arthroscopy in March of this year   TEE WITHOUT CARDIOVERSION N/A 08/29/2017   Procedure: TRANSESOPHAGEAL ECHOCARDIOGRAM (TEE);  Surgeon: Donata Clay, Theron Arista, MD;  Location: [facility] OR;  Service: Open Heart Surgery;  Laterality: N/A;   TRANSFORAMINAL LUMBAR INTERBODY FUSION W/ MIS 1 LEVEL Right  07/12/2021   Procedure: Minimally Invasive Surgery Transforaminal Lumbar Interbody Fusion  Lumbar four-five;  Surgeon: Bedelia Person, MD;  Location: [facility] OR;  Service: Neurosurgery;  Laterality: Right;   TRANSTHORACIC ECHOCARDIOGRAM  06/06/2017   Mild to moderate reduced EF 40 and 45%.  Anterior septal, inferoseptal and basal to mid inferior hypokinesis.  GR 1 DD.  No significant valvular lesion    Social History   Socioeconomic History   Marital status: Single    Spouse name: Not on file   Number of children: 0   Years of education: 16   Highest education level: Not on file  Occupational History    Employer: [employer]  Tobacco Use   Smoking status: Former    Types: Cigarettes    Quit date: 02/04/1971    Years since quitting: 51.5   Smokeless tobacco: Never   Tobacco comments:    smoked 2 cigarettes a day  Vaping Use   Vaping Use: Never used  Substance and Sexual Activity   Alcohol use: No    Alcohol/week: 0.0 standard drinks of alcohol   Drug use: No   Sexual activity: Never  Other Topics Concern   Not on file  Social History Narrative   Patient lives at home alone.    Patient has no children.    Patient has her masters in nursing.    Patient is single.    Patient drinks about 2 glasses of tea daily.   Patient is right handed.   Social Determinants of Health   Financial Resource Strain: Medium Risk (12/18/2021)   Overall Financial Resource Strain (CARDIA)    Difficulty of Paying Living Expenses: Somewhat hard  Food Insecurity: No Food Insecurity (12/18/2021)   Hunger Vital Sign    Worried About Running Out of Food in the Last Year: Never true    Ran Out of Food in the Last Year: Never true  Transportation Needs: No Transportation Needs (12/18/2021)   PRAPARE - Administrator, Civil Service (Medical): No    Lack of Transportation (Non-Medical): No  Physical Activity: Not on file  Stress: No Stress Concern Present (10/04/2021)   [employer] - Occupational Stress Questionnaire    Feeling of Stress : Only a little  Social Connections: Not on file    Family History  Problem Relation Age of Onset   Hypertension Mother    Lymphoma Mother    Depression  Mother    Arthritis Father    Alcohol abuse Father    Hypertension Sister    Cancer Brother        kidney and lung   Alcohol abuse Brother    Alcohol abuse Paternal Uncle    Alcohol abuse Paternal Grandfather    Alcohol abuse Paternal Grandmother    Allergic rhinitis Neg Hx    Angioedema Neg Hx    Asthma Neg Hx    Atopy Neg Hx    Eczema Neg Hx    Immunodeficiency Neg Hx    Urticaria Neg Hx     Outpatient Encounter Medications as of 07/31/2022  Medication Sig   albuterol (VENTOLIN HFA) 108 (90 Base) MCG/ACT inhaler Inhale into the lungs every 6 (six) hours as needed for wheezing or shortness of breath.   azelastine (ASTELIN) 0.1 % nasal spray Place into both nostrils 2 (two) times daily. Use in each nostril as directed   ALPRAZolam (XANAX) 0.25 MG tablet Take 1 tablet (0.25 mg total) by mouth at bedtime as needed for anxiety or sleep.   ARIPiprazole (ABILIFY) 2 MG tablet Take 1 tablet (2 mg total) by mouth daily.   aspirin EC 81 MG tablet Take 1 tablet (81 mg total) by mouth daily.   buPROPion (WELLBUTRIN XL) 150 MG 24 hr tablet Take 1 tablet (150 mg total) by mouth every morning.   carvedilol (COREG) 6.25 MG tablet Take 1 tablet (6.25 mg total) by mouth 2 (two) times daily with a meal.   cetirizine (ZYRTEC) 10 MG tablet Take 1 tablet tablet 1-2 times daily. (Patient not taking: Reported on 07/31/2022)   diclofenac Sodium (VOLTAREN) 1 % GEL Apply 2 g topically 4 (four) times daily as needed (pain).   diphenhydrAMINE (BENADRYL) 25 mg capsule Take 25 mg by mouth as needed for allergies or itching.   empagliflozin (JARDIANCE) 10 MG TABS tablet Take 1 tablet (10 mg total) by mouth daily.   EPINEPHrine 0.3 mg/0.3 mL IJ SOAJ injection Inject 0.3  mg into the muscle once.   ferrous sulfate 325 (65 FE) MG EC tablet Take 325 mg by mouth. One daily   fluticasone (FLONASE) 50 MCG/ACT nasal spray Place 1 spray into both nostrils daily. (Patient not taking: Reported on 07/31/2022)   gabapentin (NEURONTIN) 400 MG capsule Take 400 mg by mouth in the morning. & 800 mg at bedtime   glipiZIDE (GLUCOTROL XL) 5 MG 24 hr tablet Take 5 mg by mouth daily with breakfast.   lamoTRIgine (LAMICTAL) 150 MG tablet Take 1 tablet (150 mg total) by mouth at bedtime.   levothyroxine (SYNTHROID) 112 MCG tablet Take 112 mcg by mouth daily before breakfast.   losartan (COZAAR) 50 MG tablet Take 50 mg by mouth every morning.   meclizine (ANTIVERT) 25 MG tablet Take 1 tablet (25 mg total) by mouth 3 (three) times daily as needed for dizziness. (Patient taking differently: Take 25 mg by mouth daily as needed for dizziness.)   ondansetron (ZOFRAN) 4 MG tablet Take 1 tablet (4 mg total) by mouth every 8 (eight) hours as needed for nausea or vomiting.   OVER THE COUNTER MEDICATION daily as needed (pain). CBD ointment/roll on   pantoprazole (PROTONIX) 40 MG tablet Take 1 tablet by mouth daily before supper.   REPATHA SURECLICK 140 MG/ML SOAJ INJECT ONE DOSE INTO SKIN EVERY 14 DAYS.   rOPINIRole (REQUIP) 3 MG tablet TAKE ONE TABLET BY MOUTH EVERY MORNING, ,NOON AND AT BEDTIME. (PRT PT MORNING,EVENING,BEDTIME) (Patient taking differently:  Take 3 mg by mouth 3 (three) times daily.)   tiZANidine (ZANAFLEX) 2 MG tablet Take 2 mg by mouth 2 (two) times daily.   TOUJEO SOLOSTAR 300 UNIT/ML Solostar Pen Inject 24 Units into the skin at bedtime.   venlafaxine XR (EFFEXOR-XR) 150 MG 24 hr capsule TAKE TWO (2) CAPSULES BY MOUTH AT BEDTIME   Vitamin D, Ergocalciferol, (DRISDOL) 1.25 MG (50000 UNIT) CAPS capsule Take 50,000 Units by mouth every 7 (seven) days. Sunday   [DISCONTINUED] empagliflozin (JARDIANCE) 10 MG TABS tablet Take 25 mg by mouth daily.   [DISCONTINUED] traMADol (ULTRAM)  50 MG tablet Take 1 tablet (50 mg total) by mouth every 6 (six) hours as needed. (Patient not taking: Reported on 07/31/2022)   No facility-administered encounter medications on file as of 07/31/2022.    ALLERGIES: Allergies  Allergen Reactions   Amoxicillin Anaphylaxis and Other (See Comments)    Has patient had a PCN reaction causing immediate rash, facial/tongue/throat swelling, SOB or lightheadedness with hypotension: Yes Has patient had a PCN reaction causing severe rash involving mucus membranes or skin necrosis: Yes Has patient had a PCN reaction that required hospitalization: Yes Has patient had a PCN reaction occurring within the last 10 years: Yes If all of the above answers are "NO", then may proceed with Cephalosporin use.    Hydrocodone Anaphylaxis   Percocet [Oxycodone-Acetaminophen] Other (See Comments)    Causes chest pain /tightness. Pressure pain around her ribs.   Acetaminophen    Depacon [Valproate Sodium]    Depacon [Valproic Acid] Other (See Comments)    Causes falls    Oxycodone    Oxycodone Hcl    Prednisone Itching    VACCINATION STATUS: Immunization History  Administered Date(s) Administered   Influenza,inj,Quad PF,6+ Mos 01/20/2018   Janssen (J&J) SARS-COV-2 Vaccination 03/17/2020   Pneumococcal Polysaccharide-23 01/30/2015   Td,absorbed, Preservative Free, Adult Use, Lf Unspecified 07/31/1999   Tdap 07/31/1999, 04/08/2020    Diabetes She presents for her initial diabetic visit. She has type 2 diabetes mellitus. Onset time: She was diagnosed at approximate age of 47 years.  This patient was previously seen in this clinic for diabetic management, last visit was in 2021. There are no hypoglycemic associated symptoms. Pertinent negatives for hypoglycemia include no confusion, headaches, pallor or seizures. Associated symptoms include blurred vision, fatigue, polydipsia and polyuria. Pertinent negatives for diabetes include no chest pain and no polyphagia.  There are no hypoglycemic complications. Symptoms are worsening. Diabetic complications include heart disease and nephropathy. Risk factors for coronary artery disease include dyslipidemia, diabetes mellitus, hypertension, obesity, family history, post-menopausal, sedentary lifestyle and tobacco exposure. Current diabetic treatment includes insulin injections. Her weight is fluctuating minimally. She is following a generally unhealthy diet. When asked about meal planning, she reported none. She has had a previous visit with a dietitian. She rarely participates in exercise. Her overall blood glucose range is 180-200 mg/dl. (She did not bring logs to review.  Her recent A1c was 7.6%.) An ACE inhibitor/angiotensin II receptor blocker is being taken. Eye exam is current.  Hypertension This is a chronic problem. Associated symptoms include blurred vision. Pertinent negatives include no chest pain, headaches, palpitations or shortness of breath. Risk factors for coronary artery disease include dyslipidemia, diabetes mellitus, family history, obesity, post-menopausal state, smoking/tobacco exposure and sedentary lifestyle. Past treatments include angiotensin blockers. Hypertensive end-organ damage includes CAD/MI. Identifiable causes of hypertension include chronic renal disease.     Review of Systems  Constitutional:  Positive for fatigue. Negative for chills,  fever and unexpected weight change.  HENT:  Negative for trouble swallowing and voice change.   Eyes:  Positive for blurred vision. Negative for visual disturbance.  Respiratory:  Negative for cough, shortness of breath and wheezing.   Cardiovascular:  Negative for chest pain, palpitations and leg swelling.  Gastrointestinal:  Negative for diarrhea, nausea and vomiting.  Endocrine: Positive for polydipsia and polyuria. Negative for cold intolerance, heat intolerance and polyphagia.  Musculoskeletal:  Negative for arthralgias and myalgias.  Skin:   Negative for color change, pallor, rash and wound.  Neurological:  Negative for seizures and headaches.  Psychiatric/Behavioral:  Negative for confusion and suicidal ideas.     Objective:       07/31/2022    9:58 AM 06/24/2022   10:11 AM 06/17/2022   11:08 AM  Vitals with BMI  Height 5\' 0"  5\' 0"  5\' 0"   Weight 264 lbs 13 oz 257 lbs 259 lbs 13 oz  BMI 51.71 50.19 50.74  Systolic 134  116  Diastolic 76  70  Pulse 64  76    BP 134/76   Pulse 64   Ht 5' (1.524 m)   Wt 264 lb 12.8 oz (120.1 kg)   BMI 51.72 kg/m   Wt Readings from Last 3 Encounters:  07/31/22 264 lb 12.8 oz (120.1 kg)  06/24/22 257 lb (116.6 kg)  06/17/22 259 lb 12.8 oz (117.8 kg)     Physical Exam Constitutional:      Appearance: She is well-developed.  HENT:     Head: Normocephalic and atraumatic.  Neck:     Thyroid: No thyromegaly.     Trachea: No tracheal deviation.  Cardiovascular:     Rate and Rhythm: Normal rate and regular rhythm.  Pulmonary:     Effort: Pulmonary effort is normal.     Breath sounds: Normal breath sounds.  Abdominal:     Tenderness: There is no abdominal tenderness. There is no guarding.  Musculoskeletal:        General: Normal range of motion.     Cervical back: Normal range of motion and neck supple.  Skin:    General: Skin is warm and dry.     Coloration: Skin is not pale.     Findings: No erythema or rash.  Neurological:     Mental Status: She is alert and oriented to person, place, and time.     Cranial Nerves: No cranial nerve deficit.     Coordination: Coordination normal.     Deep Tendon Reflexes: Reflexes are normal and symmetric.  Psychiatric:        Judgment: Judgment normal.     CMP ( most recent) CMP     Component Value Date/Time   NA 137 06/03/2022 0937   K 4.1 06/03/2022 0937   CL 100 06/03/2022 0937   CO2 20 06/03/2022 0937   GLUCOSE 333 (H) 06/03/2022 0937   GLUCOSE 171 (H) 07/13/2021 0230   BUN 24 06/03/2022 0937   CREATININE 1.35 (H)  06/03/2022 0937   CREATININE 1.10 (H) 03/05/2019 1221   CALCIUM 9.4 06/03/2022 0937   PROT 7.1 07/05/2021 1400   PROT 6.9 01/11/2021 1014   ALBUMIN 3.3 (L) 07/05/2021 1400   ALBUMIN 4.1 01/11/2021 1014   AST 26 07/05/2021 1400   ALT 32 07/05/2021 1400   ALKPHOS 58 07/05/2021 1400   BILITOT 0.6 07/05/2021 1400   BILITOT 0.2 01/11/2021 1014   GFRNONAA 41 (L) 07/13/2021 0230   GFRNONAA 53 (L) 03/05/2019 1221  GFRAA 56 (L) 04/23/2019 1511   GFRAA 61 03/05/2019 1221     Diabetic Labs (most recent): Lab Results  Component Value Date   HGBA1C 7.6 06/03/2022   HGBA1C 7.4 (H) 07/05/2021   HGBA1C 6.6 11/08/2019   MICROALBUR 0.9 01/29/2018   MICROALBUR 1.5 07/10/2016     Lipid Panel ( most recent) Lipid Panel     Component Value Date/Time   CHOL 129 11/08/2019 0000   TRIG 158 11/08/2019 0000   HDL 60 11/08/2019 0000   CHOLHDL 3.8 03/05/2019 1221   VLDL 31 (H) 07/10/2016 0934   LDLCALC 43 11/08/2019 0000   LDLCALC 152 (H) 03/05/2019 1221      Lab Results  Component Value Date   TSH 1.49 03/05/2019   TSH 2.671 10/30/2018   TSH 3.05 04/24/2018   TSH 2.16 01/29/2018   TSH 3.71 07/11/2017   TSH 2.13 07/10/2016   TSH 1.766 05/12/2015   FREET4 1.2 03/05/2019   FREET4 1.05 10/30/2018   FREET4 1.2 01/29/2018   FREET4 1.4 07/11/2017   FREET4 1.0 07/10/2016   FREET4 1.22 05/12/2015           Assessment & Plan:   1. DM type 2 causing vascular disease  - Norma [name] has currently uncontrolled symptomatic type 2 DM since  72 years of age,  with most recent A1c of 7.6 %. Recent labs reviewed. - I had a long discussion with her about the possible risk factors and  the pathology behind its diabetes and its complications. -her diabetes is complicated by coronary artery disease, CKD, obesity/sedentary life, polypharmacy, and she remains at a high risk for more acute and chronic complications which include CAD, CVA, CKD, retinopathy, and neuropathy. These are all discussed  in detail with her.  - I discussed all available options of managing her diabetes including de-escalation of medications. I have counseled her on Food as Medicine by adopting a Whole Food , Plant Predominant  ( WFPP) nutrition as recommended by [employer] of Lifestyle Medicine. Patient is encouraged to switch to  unprocessed or minimally processed  complex starch, adequate protein intake (mainly plant source), minimal liquid fat, plenty of fruits, and vegetables. -  she is advised to stick to a routine mealtimes to eat 3 complete meals a day and snack only when necessary ( to snack only to correct hypoglycemia BG <70 day time or <100 at night).   - she acknowledges that there is a room for improvement in her food and drink choices. - Further Specific Suggestion is made for her to avoid simple carbohydrates  from her diet including Cakes, Sweet Desserts, Ice Cream, Soda (diet and regular), Sweet Tea, Candies, Chips, Cookies, Store Bought Juices, Alcohol ,  Artificial Sweeteners,  Coffee Creamer, and "Sugar-free" Products. This will help patient to have more stable blood glucose profile and potentially avoid unintended weight gain.  The following Lifestyle Medicine recommendations according to American College of Lifestyle Medicine [facility]) were discussed and offered to patient and she agrees to start the journey:  A.  Whole Foods, Plant-based plate comprising of fruits and vegetables, plant-based proteins, whole-grain carbohydrates was discussed in detail with the patient.   A list for source of those nutrients were also provided to the patient.  Patient will use only water or unsweetened tea for hydration. B.  The need to stay away from risky substances including alcohol, smoking; obtaining 7 to 9 hours of restorative sleep, at least 150 minutes of moderate intensity exercise weekly,  the importance of healthy social connections,  and stress reduction techniques were discussed. C.  A full color page of   Calorie density of various food groups per pound showing examples of each food groups was provided to the patient.  - she will be scheduled with Norm Salt, RDN, CDE for individualized diabetes education.  - I have approached her with the following individualized plan to manage  her diabetes and patient agrees:   - she he is advised to adjust her Toujeo at 24 units nightly, associated with monitoring of blood glucose 4 times a day-before meals and at bedtime and return in 10-15 days for reevaluation. - In the meantime, she is advised to continue Jardiance 10 mg p.o. once a day at breakfast, glipizide 5 mg XL p.o. daily at breakfast.   She will been for from a CGM.  She will be considered for either freestyle libre or Dexcom next visit.  - she is encouraged to call clinic for blood glucose levels less than 70 or above 300 mg /dl.  - Specific targets for  A1c;  LDL, HDL,  and Triglycerides were discussed with the patient.  2) Blood Pressure /Hypertension:  her blood pressure is  controlled to target.   she is advised to continue her current medications including losartan 50 mg p.o. daily with breakfast . 3) Lipids/Hyperlipidemia:   Review of her recent lipid panel showed  controlled  LDL at 43 .  she  is advised to continue    Repatha 140 mg subcutaneously every other week.   4)  Weight/Diet:  Body mass index is 51.72 kg/m.  -   clearly complicating her diabetes care.   she is  a candidate for weight loss. I discussed with her the fact that loss of 5 - 10% of her  current body weight will have the most impact on her diabetes management.  The above detailed  ACLM recommendations for nutrition, exercise, sleep, social life, avoidance of risky substances, the need for restorative sleep   information will also detailed on discharge instructions.  5) hypothyroidism: Longstanding diagnosis.  No recent thyroid function test to review.  She is advised to continue levothyroxine 112 mcg p.o. daily  before breakfast.  - We discussed about the correct intake of her thyroid hormone, on empty stomach at fasting, with water, separated by at least 30 minutes from breakfast and other medications,  and separated by more than 4 hours from calcium, iron, multivitamins, acid reflux medications (PPIs). -Patient is made aware of the fact that thyroid hormone replacement is needed for life, dose to be adjusted by periodic monitoring of thyroid function tests.   6) Chronic Care/Health Maintenance:  -she  is on ARB  medications and  is encouraged to initiate and continue to follow up with Ophthalmology, Dentist,  Podiatrist at least yearly or according to recommendations, and advised to   stay away from smoking. I have recommended yearly flu vaccine and pneumonia vaccine at least every 5 years; moderate intensity exercise for up to 150 minutes weekly; and  sleep for 7- 9 hours a day.  - she is  advised to maintain close follow up with [name], MD for primary care needs, as well as her other providers for optimal and coordinated care.   I spent 62 minutes in the care of the patient today including review of labs from CMP, Lipids, Thyroid Function, Hematology (current and previous including abstractions from other facilities); face-to-face time discussing  her blood glucose  readings/logs, discussing hypoglycemia and hyperglycemia episodes and symptoms, medications doses, her options of short and long term treatment based on the latest standards of care / guidelines;  discussion about incorporating lifestyle medicine;  and documenting the encounter. Risk reduction counseling performed per USPSTF guidelines to reduce  obesity and cardiovascular risk factors.      Please refer to Patient Instructions for Blood Glucose Monitoring and Insulin/Medications Dosing Guide"  in media tab for additional information. Please  also refer to " Patient Self Inventory" in the Media  tab for reviewed elements of pertinent  patient history.  Norma [name] participated in the discussions, expressed understanding, and voiced agreement with the above plans.  All questions were answered to her satisfaction. she is encouraged to contact clinic should she have any questions or concerns prior to her return visit.   Follow up plan: - Return in about 10 days (around 08/10/2022) for F/U with Meter/CGM /Logs Only - no Labs.  Marquis Lunch, MD [facility] [address] [place], [place] [address] Phone: [phone]  Fax: [phone]    08/01/2022, 2:51 PM  This note was partially dictated with voice recognition software. Similar sounding words can be transcribed inadequately or may not  be corrected upon review.

## 2022-07-31 NOTE — Patient Instructions (Signed)

## 2022-08-06 ENCOUNTER — Other Ambulatory Visit: Payer: Self-pay

## 2022-08-06 DIAGNOSIS — L97521 Non-pressure chronic ulcer of other part of left foot limited to breakdown of skin: Secondary | ICD-10-CM | POA: Diagnosis not present

## 2022-08-06 MED ORDER — LOSARTAN POTASSIUM 50 MG PO TABS: 50.0000 mg | ORAL_TABLET | Freq: Every morning | ORAL | 1 refills | Status: DC

## 2022-08-07 ENCOUNTER — Other Ambulatory Visit (HOSPITAL_COMMUNITY): Payer: Self-pay | Admitting: Psychiatry

## 2022-08-07 ENCOUNTER — Other Ambulatory Visit: Payer: Self-pay | Admitting: "Endocrinology

## 2022-08-07 MED ORDER — GLIPIZIDE ER 5 MG PO TB24: 5.0000 mg | ORAL_TABLET | Freq: Every day | ORAL | 1 refills | Status: DC

## 2022-08-13 ENCOUNTER — Telehealth (INDEPENDENT_AMBULATORY_CARE_PROVIDER_SITE_OTHER): Payer: Medicare Other | Admitting: Psychiatry

## 2022-08-13 ENCOUNTER — Encounter (HOSPITAL_COMMUNITY): Payer: Self-pay | Admitting: Psychiatry

## 2022-08-13 DIAGNOSIS — F3131 Bipolar disorder, current episode depressed, mild: Secondary | ICD-10-CM

## 2022-08-13 MED ORDER — BUPROPION HCL ER (XL) 150 MG PO TB24: 150.0000 mg | ORAL_TABLET | Freq: Every morning | ORAL | 2 refills | Status: DC

## 2022-08-13 MED ORDER — LAMOTRIGINE 150 MG PO TABS: 150.0000 mg | ORAL_TABLET | Freq: Every day | ORAL | 2 refills | Status: DC

## 2022-08-13 MED ORDER — ALPRAZOLAM 0.25 MG PO TABS: 0.2500 mg | ORAL_TABLET | Freq: Every evening | ORAL | 2 refills | Status: DC | PRN

## 2022-08-13 MED ORDER — VENLAFAXINE HCL ER 150 MG PO CP24: ORAL_CAPSULE | ORAL | 2 refills | Status: DC

## 2022-08-13 MED ORDER — ARIPIPRAZOLE 5 MG PO TABS: 5.0000 mg | ORAL_TABLET | Freq: Every day | ORAL | 2 refills | Status: DC

## 2022-08-13 NOTE — Progress Notes (Signed)
Virtual Visit via Telephone Note  I connected with Norma [name] on 08/13/22 at 11:00 AM EDT by telephone and verified that I am speaking with the correct person using two identifiers.  Location: Patient: home Provider: office   I discussed the limitations, risks, security and privacy concerns of performing an evaluation and management service by telephone and the availability of in person appointments. I also discussed with the patient that there may be a patient responsible charge related to this service. The patient expressed understanding and agreed to proceed.     I discussed the assessment and treatment plan with the patient. The patient was provided an opportunity to ask questions and all were answered. The patient agreed with the plan and demonstrated an understanding of the instructions.   The patient was advised to call back or seek an in-person evaluation if the symptoms worsen or if the condition fails to improve as anticipated.  I provided 15 minutes of non-face-to-face time during this encounter.   Diannia Ruder, MD  [facility] MD/PA/NP OP Progress Note  08/13/2022 11:21 AM Norma [name]  MRN:  [id number]  Chief Complaint:  Chief Complaint  Patient presents with   Depression   Anxiety   Follow-up  HPI: This patient is a 72 year old white female who lives alone in [place].  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  She states the last months she has been more depressed.  She is no longer driving because of her neuropathy in her feet and she sold her car to her nephew.  However family is not always very good about taking her places and she is feeling more more socially isolated.  No one from her chart to stop by recent notes to check on her.  I strongly urged her to check on the senior center and he knows they have a lot of activities that  she might like and she is going to do so.  I think a lot of her depression has to do with the social isolation and she agrees.  She denies thoughts of self-harm or suicide.  Her sleep is variable.  She is having a lot of trouble breathing right now and has not appointment with pulmonology coming up.  She really does not want to add a lot more medication but I suggested going up a little bit on the Abilify for the low mood and she was willing to try this.  She denies any manic symptoms or racing thoughts.  Visit Diagnosis:    ICD-10-CM   1. Bipolar affective disorder, currently depressed, mild (HCC)  F31.31       Past Psychiatric History: :She was hospitalized in 2001 and went through a course of ECT.  She has been tried on [employer] Wellbutrin and Zyprexa   Past Medical History:  Past Medical History:  Diagnosis Date   Anemia    Anxiety    Bipolar disorder (HCC)    Bulging lumbar disc    L3-4   Chronic daily headache    Chronic low back pain 09/20/2014   CKD (chronic kidney disease)    stage 3   COPD (chronic obstructive pulmonary disease) (HCC)    Coronary artery disease    CABG 2019   Degenerative arthritis    Depression    Diabetes mellitus, type II (HCC)    Diabetic neuropathy (  HCC) 09/16/2018   DM type 2 with diabetic peripheral neuropathy (HCC) 09/20/2014   Dyslipidemia    Dyspnea    Family history of adverse reaction to anesthesia    father had reaction to anesthesia medication per patient "they don't use it anymore"- unsure of reaction   Fatty liver    per pt report   Gastroparesis    GERD (gastroesophageal reflux disease)    Heart murmur    History of hiatal hernia    pt states she no longer has this   Hypothyroidism    IBS (irritable bowel syndrome)    LBBB (left bundle branch block)    Memory difficulty 09/20/2014   Morbid obesity (HCC)    Myocardial infarction (HCC)    per pt- prior to CABG   Neuropathy    Obstructive sleep apnea on CPAP     Restless legs syndrome (RLS) 09/17/2012   Stroke (cerebrum) (HCC) 05/16/2017   Left parietal   Urticaria     Past Surgical History:  Procedure Laterality Date   ABDOMINAL HYSTERECTOMY     ARTHROSCOPY KNEE W/ DRILLING  06/2011   and Decemer of 2013.   Carpel tunnel  1980's   CATARACT EXTRACTION W/PHACO Right 03/21/2017   Procedure: CATARACT EXTRACTION PHACO AND INTRAOCULAR LENS PLACEMENT RIGHT EYE;  Surgeon: Fabio Pierce, MD;  Location: [facility];  Service: Ophthalmology;  Laterality: Right;  CDE: 2.91    CATARACT EXTRACTION W/PHACO Left 04/18/2017   Procedure: CATARACT EXTRACTION PHACO AND INTRAOCULAR LENS PLACEMENT LEFT EYE;  Surgeon: Fabio Pierce, MD;  Location: [facility];  Service: Ophthalmology;  Laterality: Left;  left   CHOLECYSTECTOMY     COLONOSCOPY WITH PROPOFOL N/A 10/04/2016   Procedure: COLONOSCOPY WITH PROPOFOL;  Surgeon: Malissa Hippo, MD;  Location: [facility];  Service: Endoscopy;  Laterality: N/A;  11:10   CORONARY ARTERY BYPASS GRAFT N/A 08/29/2017   Procedure: CORONARY ARTERY BYPASS GRAFTING (CABG) x 3 WITH ENDOSCOPIC HARVESTING OF RIGHT SAPHENOUS VEIN;  Surgeon: Kerin Perna, MD;  Location: [facility] OR;  Service: Open Heart Surgery;  Laterality: N/A;   ESOPHAGOGASTRODUODENOSCOPY N/A 04/25/2016   Procedure: ESOPHAGOGASTRODUODENOSCOPY (EGD);  Surgeon: Malissa Hippo, MD;  [facility]: [facility];  Service: Endoscopy;  Laterality: N/A;  730   LEFT HEART CATH AND CORONARY ANGIOGRAPHY N/A 08/27/2017   Procedure: LEFT HEART CATH AND CORONARY ANGIOGRAPHY;  Surgeon: Tonny Bollman, MD;  Location: [name] INVASIVE CV LAB;  Service: Cardiovascular::  pLAD 95% - p-mLAD 50%, ostD1 90% -pD1 80%; ostOM1 90%; rPDA 80%. EF ~50-55% - HK of dital Anterolateral & Apical wall.  - Rec CVTS c/s   NECK SURGERY     pt reports having growth removed from the back of her neck- after 2021   OPEN REDUCTION INTERNAL FIXATION (ORIF) HAND Left 04/10/2020   Procedure: OPEN REDUCTION INTERNAL FIXATION  (ORIF) HAND, left index finger;  Surgeon: Vickki Hearing, MD;  Location: [facility];  Service: Orthopedics;  Laterality: Left;  0.045 k wires   RECTAL SURGERY     fissure   SHOULDER SURGERY Left    arthroscopy in March of this year   TEE WITHOUT CARDIOVERSION N/A 08/29/2017   Procedure: TRANSESOPHAGEAL ECHOCARDIOGRAM (TEE);  Surgeon: Donata Clay, Theron Arista, MD;  Location: [facility] OR;  Service: Open Heart Surgery;  Laterality: N/A;   TRANSFORAMINAL LUMBAR INTERBODY FUSION W/ MIS 1 LEVEL Right 07/12/2021   Procedure: Minimally Invasive Surgery Transforaminal Lumbar Interbody Fusion  Lumbar four-five;  Surgeon: Bedelia Person, MD;  Location:  [facility] OR;  Service: Neurosurgery;  Laterality: Right;   TRANSTHORACIC ECHOCARDIOGRAM  06/06/2017   Mild to moderate reduced EF 40 and 45%.  Anterior septal, inferoseptal and basal to mid inferior hypokinesis.  GR 1 DD.  No significant valvular lesion    Family Psychiatric History: See below  Family History:  Family History  Problem Relation Age of Onset   Hypertension Mother    Lymphoma Mother    Depression Mother    Arthritis Father    Alcohol abuse Father    Hypertension Sister    Cancer Brother        kidney and lung   Alcohol abuse Brother    Alcohol abuse Paternal Uncle    Alcohol abuse Paternal Grandfather    Alcohol abuse Paternal Grandmother    Allergic rhinitis Neg Hx    Angioedema Neg Hx    Asthma Neg Hx    Atopy Neg Hx    Eczema Neg Hx    Immunodeficiency Neg Hx    Urticaria Neg Hx     Social History:  Social History   Socioeconomic History   Marital status: Single    Spouse name: Not on file   Number of children: 0   Years of education: 16   Highest education level: Not on file  Occupational History    Employer: [employer]  Tobacco Use   Smoking status: Former    Types: Cigarettes    Quit date: 02/04/1971    Years since quitting: 51.5   Smokeless tobacco: Never   Tobacco comments:    smoked 2 cigarettes a day   Vaping Use   Vaping Use: Never used  Substance and Sexual Activity   Alcohol use: No    Alcohol/week: 0.0 standard drinks of alcohol   Drug use: No   Sexual activity: Never  Other Topics Concern   Not on file  Social History Narrative   Patient lives at home alone.    Patient has no children.    Patient has her masters in nursing.    Patient is single.    Patient drinks about 2 glasses of tea daily.   Patient is right handed.   Social Determinants of Health   Financial Resource Strain: Medium Risk (12/18/2021)   Overall Financial Resource Strain (CARDIA)    Difficulty of Paying Living Expenses: Somewhat hard  Food Insecurity: No Food Insecurity (12/18/2021)   Hunger Vital Sign    Worried About Running Out of Food in the Last Year: Never true    Ran Out of Food in the Last Year: Never true  Transportation Needs: No Transportation Needs (12/18/2021)   PRAPARE - Administrator, Civil Service (Medical): No    Lack of Transportation (Non-Medical): No  Physical Activity: Not on file  Stress: No Stress Concern Present (10/04/2021)   [employer] of Occupational Health - Occupational Stress Questionnaire    Feeling of Stress : Only a little  Social Connections: Not on file    Allergies:  Allergies  Allergen Reactions   Amoxicillin Anaphylaxis and Other (See Comments)    Has patient had a PCN reaction causing immediate rash, facial/tongue/throat swelling, SOB or lightheadedness with hypotension: Yes Has patient had a PCN reaction causing severe rash involving mucus membranes or skin necrosis: Yes Has patient had a PCN reaction that required hospitalization: Yes Has patient had a PCN reaction occurring within the last 10 years: Yes If all of the above answers are "NO", then may proceed  with Cephalosporin use.    Hydrocodone Anaphylaxis   Percocet [Oxycodone-Acetaminophen] Other (See Comments)    Causes chest pain /tightness. Pressure pain around her ribs.    Acetaminophen    Depacon [Valproate Sodium]    Depacon [Valproic Acid] Other (See Comments)    Causes falls    Oxycodone    Oxycodone Hcl    Prednisone Itching    Metabolic Disorder Labs: Lab Results  Component Value Date   HGBA1C 7.6 06/03/2022   MPG 165.68 07/05/2021   MPG 148 03/05/2019   No results found for: "PROLACTIN" Lab Results  Component Value Date   CHOL 129 11/08/2019   TRIG 158 11/08/2019   HDL 60 11/08/2019   CHOLHDL 3.8 03/05/2019   VLDL 31 (H) 07/10/2016   LDLCALC 43 11/08/2019   LDLCALC 152 (H) 03/05/2019   Lab Results  Component Value Date   TSH 1.49 03/05/2019   TSH 2.671 10/30/2018    Therapeutic Level Labs: No results found for: "LITHIUM" No results found for: "VALPROATE" No results found for: "CBMZ"  Current Medications: Current Outpatient Medications  Medication Sig Dispense Refill   ARIPiprazole (ABILIFY) 5 MG tablet Take 1 tablet (5 mg total) by mouth daily. 30 tablet 2   albuterol (VENTOLIN HFA) 108 (90 Base) MCG/ACT inhaler Inhale into the lungs every 6 (six) hours as needed for wheezing or shortness of breath.     ALPRAZolam (XANAX) 0.25 MG tablet Take 1 tablet (0.25 mg total) by mouth at bedtime as needed for anxiety or sleep. 30 tablet 2   aspirin EC 81 MG tablet Take 1 tablet (81 mg total) by mouth daily. 90 tablet 3   azelastine (ASTELIN) 0.1 % nasal spray Place into both nostrils 2 (two) times daily. Use in each nostril as directed     buPROPion (WELLBUTRIN XL) 150 MG 24 hr tablet Take 1 tablet (150 mg total) by mouth every morning. 30 tablet 2   carvedilol (COREG) 6.25 MG tablet Take 1 tablet (6.25 mg total) by mouth 2 (two) times daily with a meal. 180 tablet 2   diclofenac Sodium (VOLTAREN) 1 % GEL Apply 2 g topically 4 (four) times daily as needed (pain).     diphenhydrAMINE (BENADRYL) 25 mg capsule Take 25 mg by mouth as needed for allergies or itching.     empagliflozin (JARDIANCE) 10 MG TABS tablet Take 1 tablet (10 mg total)  by mouth daily. 90 tablet 1   EPINEPHrine 0.3 mg/0.3 mL IJ SOAJ injection Inject 0.3 mg into the muscle once.     ferrous sulfate 325 (65 FE) MG EC tablet Take 325 mg by mouth. One daily     fluticasone (FLONASE) 50 MCG/ACT nasal spray Place 1 spray into both nostrils daily. (Patient not taking: Reported on 07/31/2022)     gabapentin (NEURONTIN) 400 MG capsule Take 400 mg by mouth in the morning. & 800 mg at bedtime     glipiZIDE (GLUCOTROL XL) 5 MG 24 hr tablet Take 1 tablet (5 mg total) by mouth daily with breakfast. 90 tablet 1   lamoTRIgine (LAMICTAL) 150 MG tablet Take 1 tablet (150 mg total) by mouth at bedtime. 30 tablet 2   levothyroxine (SYNTHROID) 112 MCG tablet Take 112 mcg by mouth daily before breakfast.     losartan (COZAAR) 50 MG tablet Take 1 tablet (50 mg total) by mouth every morning. 90 tablet 1   meclizine (ANTIVERT) 25 MG tablet Take 1 tablet (25 mg total) by mouth 3 (three) times daily  as needed for dizziness. (Patient taking differently: Take 25 mg by mouth daily as needed for dizziness.) 30 tablet 0   ondansetron (ZOFRAN) 4 MG tablet Take 1 tablet (4 mg total) by mouth every 8 (eight) hours as needed for nausea or vomiting. 20 tablet 0   OVER THE COUNTER MEDICATION daily as needed (pain). CBD ointment/roll on     pantoprazole (PROTONIX) 40 MG tablet Take 1 tablet by mouth daily before supper. 90 tablet 3   REPATHA SURECLICK 140 MG/ML SOAJ INJECT ONE DOSE INTO SKIN EVERY 14 DAYS. 2 mL 11   rOPINIRole (REQUIP) 3 MG tablet TAKE ONE TABLET BY MOUTH EVERY MORNING, ,NOON AND AT BEDTIME. (PRT PT MORNING,EVENING,BEDTIME) (Patient taking differently: Take 3 mg by mouth 3 (three) times daily.) 270 tablet 3   tiZANidine (ZANAFLEX) 2 MG tablet Take 2 mg by mouth 2 (two) times daily.     TOUJEO SOLOSTAR 300 UNIT/ML Solostar Pen Inject 24 Units into the skin at bedtime.     venlafaxine XR (EFFEXOR-XR) 150 MG 24 hr capsule TAKE TWO (2) CAPSULES BY MOUTH AT BEDTIME 60 capsule 2   Vitamin D,  Ergocalciferol, (DRISDOL) 1.25 MG (50000 UNIT) CAPS capsule Take 50,000 Units by mouth every 7 (seven) days. Sunday     No current facility-administered medications for this visit.     Musculoskeletal: Strength & Muscle Tone: na Gait & Station: na Patient leans: N/A  Psychiatric Specialty Exam: Review of Systems  Constitutional:  Positive for fatigue.  Respiratory:  Positive for shortness of breath.   Musculoskeletal:  Positive for arthralgias.  Psychiatric/Behavioral:  Positive for dysphoric mood.   All other systems reviewed and are negative.   There were no vitals taken for this visit.There is no height or weight on file to calculate BMI.  General Appearance: NA  Eye Contact:  NA  Speech:  Clear and Coherent  Volume:  Normal  Mood:  Dysphoric  Affect:  NA  Thought Process:  Goal Directed  Orientation:  Full (Time, Place, and Person)  Thought Content: Rumination   Suicidal Thoughts:  No  Homicidal Thoughts:  No  Memory:  Immediate;   Good Recent;   Good Remote;   Good  Judgement:  Good  Insight:  Good  Psychomotor Activity:  Decreased  Concentration:  Concentration: Poor and Attention Span: Poor  Recall:  Good  Fund of Knowledge: Good  Language: Good  Akathisia:  No  Handed:  Right  AIMS (if indicated): not done  Assets:  Communication Skills Desire for Improvement Resilience  ADL's:  Intact  Cognition: WNL  Sleep:  Fair   Screenings: Mini-Mental    Flowsheet Row Office Visit from 02/18/2018 in [facility] Office Visit from 09/17/2017 in [facility]  Total Score (max 30 points ) 28 28      PHQ2-9    Flowsheet Row Video Visit from 08/13/2022 in [facility] [place] Video Visit from 01/21/2022 in [facility] [place] Video Visit from 11/26/2021 in [facility] [place] [facility] Coordination from 11/14/2021  in [facility] Patient Outreach Telephone from 10/04/2021 in [facility]  PHQ-2 Total Score 1 1 4 2 1   PHQ-9 Total Score -- -- 9 11 --      Flowsheet Row Video Visit from 08/13/2022 in [facility] [place] Video Visit from 01/21/2022 in [facility] at [facility] ED from 01/13/2022 in [facility] Emergency Department at [facility]  C-SSRS RISK CATEGORY No Risk No Risk No Risk        Assessment and Plan: This patient is a 72 year old female with a history depression anxiety mood swings and insomnia.  She has been somewhat more depressed lately but I think it is primarily due to the social isolation.  We will increase Abilify to 5 mg daily for augmentation.  She will continue Wellbutrin XL 150 mg daily as well as Effexor XR 300 mg daily for depression, Lamictal 100 mg daily for mood stabilization and Xanax 0.25 mg at bedtime as needed for anxiety and sleep.  She will return to see me in 3 months  Collaboration of Care: Collaboration of Care: Primary Care Provider AEB notes will be shared with PCP at patient's request  Patient/Guardian was advised Release of Information must be obtained prior to any record release in order to collaborate their care with an outside provider. Patient/Guardian was advised if they have not already done so to contact the registration department to sign all necessary forms in order for [place] to release information regarding their care.   Consent: Patient/Guardian gives verbal consent for treatment and assignment of benefits for services provided during this visit. Patient/Guardian expressed understanding and agreed to proceed.    Diannia Ruder, MD 08/13/2022, 11:21 AM

## 2022-08-20 ENCOUNTER — Ambulatory Visit (INDEPENDENT_AMBULATORY_CARE_PROVIDER_SITE_OTHER): Payer: Medicare Other | Admitting: "Endocrinology

## 2022-08-20 ENCOUNTER — Encounter: Payer: Self-pay | Admitting: "Endocrinology

## 2022-08-20 VITALS — BP 134/68 | HR 68 | Ht 60.0 in | Wt 261.6 lb

## 2022-08-20 DIAGNOSIS — I10 Essential (primary) hypertension: Secondary | ICD-10-CM | POA: Diagnosis not present

## 2022-08-20 DIAGNOSIS — E038 Other specified hypothyroidism: Secondary | ICD-10-CM | POA: Diagnosis not present

## 2022-08-20 DIAGNOSIS — Z794 Long term (current) use of insulin: Secondary | ICD-10-CM

## 2022-08-20 DIAGNOSIS — E1159 Type 2 diabetes mellitus with other circulatory complications: Secondary | ICD-10-CM

## 2022-08-20 DIAGNOSIS — E1169 Type 2 diabetes mellitus with other specified complication: Secondary | ICD-10-CM | POA: Diagnosis not present

## 2022-08-20 DIAGNOSIS — E785 Hyperlipidemia, unspecified: Secondary | ICD-10-CM

## 2022-08-20 DIAGNOSIS — Z7984 Long term (current) use of oral hypoglycemic drugs: Secondary | ICD-10-CM | POA: Diagnosis not present

## 2022-08-20 MED ORDER — FREESTYLE LIBRE 3 READER DEVI: 1.0000 | Freq: Once | 0 refills | Status: DC | PRN

## 2022-08-20 MED ORDER — FREESTYLE LIBRE 3 SENSOR MISC: 1.0000 | 2 refills | Status: DC

## 2022-08-20 NOTE — Progress Notes (Signed)
08/20/2022, 6:29 PM   Endocrinology follow-up note  Subjective:    Patient ID: Norma [name], female    DOB: March 23, 1951.  Norma [name] is being seen in follow-up after she was seen in consultation for management of currently uncontrolled symptomatic diabetes requested by  [name], MD.   Past Medical History:  Diagnosis Date   Anemia    Anxiety    Bipolar disorder (HCC)    Bulging lumbar disc    L3-4   Chronic daily headache    Chronic low back pain 09/20/2014   CKD (chronic kidney disease)    stage 3   COPD (chronic obstructive pulmonary disease) (HCC)    Coronary artery disease    CABG 2019   Degenerative arthritis    Depression    Diabetes mellitus, type II (HCC)    Diabetic neuropathy (HCC) 09/16/2018   DM type 2 with diabetic peripheral neuropathy (HCC) 09/20/2014   Dyslipidemia    Dyspnea    Family history of adverse reaction to anesthesia    father had reaction to anesthesia medication per patient "they don't use it anymore"- unsure of reaction   Fatty liver    per pt report   Gastroparesis    GERD (gastroesophageal reflux disease)    Heart murmur    History of hiatal hernia    pt states she no longer has this   Hypothyroidism    IBS (irritable bowel syndrome)    LBBB (left bundle branch block)    Memory difficulty 09/20/2014   Morbid obesity (HCC)    Myocardial infarction (HCC)    per pt- prior to CABG   Neuropathy    Obstructive sleep apnea on CPAP    Restless legs syndrome (RLS) 09/17/2012   Stroke (cerebrum) (HCC) 05/16/2017   Left parietal   Urticaria     Past Surgical History:  Procedure Laterality Date   ABDOMINAL HYSTERECTOMY     ARTHROSCOPY KNEE W/ DRILLING  06/2011   and Decemer of 2013.   Carpel tunnel  1980's   CATARACT EXTRACTION W/PHACO Right 03/21/2017   Procedure: CATARACT EXTRACTION PHACO AND INTRAOCULAR LENS PLACEMENT RIGHT EYE;   Surgeon: Fabio Pierce, MD;  Location: [facility];  Service: Ophthalmology;  Laterality: Right;  CDE: 2.91    CATARACT EXTRACTION W/PHACO Left 04/18/2017   Procedure: CATARACT EXTRACTION PHACO AND INTRAOCULAR LENS PLACEMENT LEFT EYE;  Surgeon: Fabio Pierce, MD;  Location: [facility];  Service: Ophthalmology;  Laterality: Left;  left   CHOLECYSTECTOMY     COLONOSCOPY WITH PROPOFOL N/A 10/04/2016   Procedure: COLONOSCOPY WITH PROPOFOL;  Surgeon: Malissa Hippo, MD;  Location: [facility];  Service: Endoscopy;  Laterality: N/A;  11:10   CORONARY ARTERY BYPASS GRAFT N/A 08/29/2017   Procedure: CORONARY ARTERY BYPASS GRAFTING (CABG) x 3 WITH ENDOSCOPIC HARVESTING OF RIGHT SAPHENOUS VEIN;  Surgeon: Kerin Perna, MD;  Location: [facility] OR;  Service: Open Heart Surgery;  Laterality: N/A;   ESOPHAGOGASTRODUODENOSCOPY N/A 04/25/2016   Procedure: ESOPHAGOGASTRODUODENOSCOPY (EGD);  Surgeon: Malissa Hippo, MD;  Location: [facility];  Service: Endoscopy;  Laterality: N/A;  730   LEFT HEART CATH AND CORONARY ANGIOGRAPHY N/A 08/27/2017   Procedure: LEFT HEART CATH AND CORONARY ANGIOGRAPHY;  Surgeon: Tonny Bollman, MD;  Location: [facility] INVASIVE CV LAB;  Service: Cardiovascular::  pLAD 95% - p-mLAD 50%, ostD1 90% -pD1 80%; ostOM1 90%; rPDA 80%. EF ~50-55% - HK of dital Anterolateral & Apical wall.  - Rec CVTS c/s   NECK SURGERY     pt reports having growth removed from the back of her neck- after 2021   OPEN REDUCTION INTERNAL FIXATION (ORIF) HAND Left 04/10/2020   Procedure: OPEN REDUCTION INTERNAL FIXATION (ORIF) HAND, left index finger;  Surgeon: Vickki Hearing, MD;  Location: [facility];  Service: Orthopedics;  Laterality: Left;  0.045 k wires   RECTAL SURGERY     fissure   SHOULDER SURGERY Left    arthroscopy in March of this year   TEE WITHOUT CARDIOVERSION N/A 08/29/2017   Procedure: TRANSESOPHAGEAL ECHOCARDIOGRAM (TEE);  Surgeon: Donata Clay, Theron Arista, MD;  Location: [facility] OR;  Service: Open Heart Surgery;   Laterality: N/A;   TRANSFORAMINAL LUMBAR INTERBODY FUSION W/ MIS 1 LEVEL Right 07/12/2021   Procedure: Minimally Invasive Surgery Transforaminal Lumbar Interbody Fusion  Lumbar four-five;  Surgeon: Bedelia Person, MD;  Location: [facility] OR;  Service: Neurosurgery;  Laterality: Right;   TRANSTHORACIC ECHOCARDIOGRAM  06/06/2017   Mild to moderate reduced EF 40 and 45%.  Anterior septal, inferoseptal and basal to mid inferior hypokinesis.  GR 1 DD.  No significant valvular lesion    Social History   Socioeconomic History   Marital status: Single    Spouse name: Not on file   Number of children: 0   Years of education: 16   Highest education level: Not on file  Occupational History    Employer: [employer]  Tobacco Use   Smoking status: Former    Types: Cigarettes    Quit date: 02/04/1971    Years since quitting: 51.5   Smokeless tobacco: Never   Tobacco comments:    smoked 2 cigarettes a day  Vaping Use   Vaping Use: Never used  Substance and Sexual Activity   Alcohol use: No    Alcohol/week: 0.0 standard drinks of alcohol   Drug use: No   Sexual activity: Never  Other Topics Concern   Not on file  Social History Narrative   Patient lives at home alone.    Patient has no children.    Patient has her masters in nursing.    Patient is single.    Patient drinks about 2 glasses of tea daily.   Patient is right handed.   Social Determinants of Health   Financial Resource Strain: Medium Risk (12/18/2021)   Overall Financial Resource Strain (CARDIA)    Difficulty of Paying Living Expenses: Somewhat hard  Food Insecurity: No Food Insecurity (12/18/2021)   Hunger Vital Sign    Worried About Running Out of Food in the Last Year: Never true    Ran Out of Food in the Last Year: Never true  Transportation Needs: No Transportation Needs (12/18/2021)   PRAPARE - Administrator, Civil Service (Medical): No    Lack of Transportation (Non-Medical): No  Physical  Activity: Not on file  Stress: No Stress Concern Present (10/04/2021)   [employer] of Occupational Health - Occupational Stress Questionnaire    Feeling of Stress : Only a little  Social Connections: Not on file    Family History  Problem Relation Age of Onset  Hypertension Mother    Lymphoma Mother    Depression Mother    Arthritis Father    Alcohol abuse Father    Hypertension Sister    Cancer Brother        kidney and lung   Alcohol abuse Brother    Alcohol abuse Paternal Uncle    Alcohol abuse Paternal Grandfather    Alcohol abuse Paternal Grandmother    Allergic rhinitis Neg Hx    Angioedema Neg Hx    Asthma Neg Hx    Atopy Neg Hx    Eczema Neg Hx    Immunodeficiency Neg Hx    Urticaria Neg Hx     Outpatient Encounter Medications as of 08/20/2022  Medication Sig   Continuous Glucose Receiver (FREESTYLE LIBRE 3 READER) DEVI 1 Piece by Does not apply route once as needed for up to 1 dose.   Continuous Glucose Sensor (FREESTYLE LIBRE 3 SENSOR) MISC 1 Piece by Does not apply route every 14 (fourteen) days. Place 1 sensor on the skin every 14 days. Use to check glucose continuously   albuterol (VENTOLIN HFA) 108 (90 Base) MCG/ACT inhaler Inhale into the lungs every 6 (six) hours as needed for wheezing or shortness of breath.   ALPRAZolam (XANAX) 0.25 MG tablet Take 1 tablet (0.25 mg total) by mouth at bedtime as needed for anxiety or sleep.   ARIPiprazole (ABILIFY) 5 MG tablet Take 1 tablet (5 mg total) by mouth daily.   aspirin EC 81 MG tablet Take 1 tablet (81 mg total) by mouth daily.   azelastine (ASTELIN) 0.1 % nasal spray Place into both nostrils 2 (two) times daily. Use in each nostril as directed   buPROPion (WELLBUTRIN XL) 150 MG 24 hr tablet Take 1 tablet (150 mg total) by mouth every morning.   carvedilol (COREG) 6.25 MG tablet Take 1 tablet (6.25 mg total) by mouth 2 (two) times daily with a meal.   diclofenac Sodium (VOLTAREN) 1 % GEL Apply 2 g topically 4  (four) times daily as needed (pain).   diphenhydrAMINE (BENADRYL) 25 mg capsule Take 25 mg by mouth as needed for allergies or itching.   empagliflozin (JARDIANCE) 10 MG TABS tablet Take 1 tablet (10 mg total) by mouth daily.   EPINEPHrine 0.3 mg/0.3 mL IJ SOAJ injection Inject 0.3 mg into the muscle once.   ferrous sulfate 325 (65 FE) MG EC tablet Take 325 mg by mouth. One daily   fluticasone (FLONASE) 50 MCG/ACT nasal spray Place 1 spray into both nostrils daily. (Patient not taking: Reported on 07/31/2022)   gabapentin (NEURONTIN) 400 MG capsule Take 400 mg by mouth in the morning. & 800 mg at bedtime   glipiZIDE (GLUCOTROL XL) 5 MG 24 hr tablet Take 1 tablet (5 mg total) by mouth daily with breakfast.   lamoTRIgine (LAMICTAL) 150 MG tablet Take 1 tablet (150 mg total) by mouth at bedtime.   levothyroxine (SYNTHROID) 112 MCG tablet Take 112 mcg by mouth daily before breakfast.   losartan (COZAAR) 50 MG tablet Take 1 tablet (50 mg total) by mouth every morning.   meclizine (ANTIVERT) 25 MG tablet Take 1 tablet (25 mg total) by mouth 3 (three) times daily as needed for dizziness. (Patient taking differently: Take 25 mg by mouth daily as needed for dizziness.)   ondansetron (ZOFRAN) 4 MG tablet Take 1 tablet (4 mg total) by mouth every 8 (eight) hours as needed for nausea or vomiting.   OVER THE COUNTER MEDICATION daily as needed (pain).  CBD ointment/roll on   pantoprazole (PROTONIX) 40 MG tablet Take 1 tablet by mouth daily before supper.   REPATHA SURECLICK 140 MG/ML SOAJ INJECT ONE DOSE INTO SKIN EVERY 14 DAYS.   rOPINIRole (REQUIP) 3 MG tablet TAKE ONE TABLET BY MOUTH EVERY MORNING, ,NOON AND AT BEDTIME. (PRT PT MORNING,EVENING,BEDTIME) (Patient taking differently: Take 3 mg by mouth 3 (three) times daily.)   tiZANidine (ZANAFLEX) 2 MG tablet Take 2 mg by mouth 2 (two) times daily.   TOUJEO SOLOSTAR 300 UNIT/ML Solostar Pen Inject 30 Units into the skin at bedtime.   venlafaxine XR (EFFEXOR-XR)  150 MG 24 hr capsule TAKE TWO (2) CAPSULES BY MOUTH AT BEDTIME   Vitamin D, Ergocalciferol, (DRISDOL) 1.25 MG (50000 UNIT) CAPS capsule Take 50,000 Units by mouth every 7 (seven) days. Sunday   No facility-administered encounter medications on file as of 08/20/2022.    ALLERGIES: Allergies  Allergen Reactions   Amoxicillin Anaphylaxis and Other (See Comments)    Has patient had a PCN reaction causing immediate rash, facial/tongue/throat swelling, SOB or lightheadedness with hypotension: Yes Has patient had a PCN reaction causing severe rash involving mucus membranes or skin necrosis: Yes Has patient had a PCN reaction that required hospitalization: Yes Has patient had a PCN reaction occurring within the last 10 years: Yes If all of the above answers are "NO", then may proceed with Cephalosporin use.    Hydrocodone Anaphylaxis   Percocet [Oxycodone-Acetaminophen] Other (See Comments)    Causes chest pain /tightness. Pressure pain around her ribs.   Acetaminophen    Depacon [Valproate Sodium]    Depacon [Valproic Acid] Other (See Comments)    Causes falls    Oxycodone    Oxycodone Hcl    Prednisone Itching    VACCINATION STATUS: Immunization History  Administered Date(s) Administered   Influenza,inj,Quad PF,6+ Mos 01/20/2018   Janssen (J&J) SARS-COV-2 Vaccination 03/17/2020   Pneumococcal Polysaccharide-23 01/30/2015   Td,absorbed, Preservative Free, Adult Use, Lf Unspecified 07/31/1999   Tdap 07/31/1999, 04/08/2020    Diabetes She presents for her follow-up diabetic visit. She has type 2 diabetes mellitus. Onset time: She was diagnosed at approximate age of 47 years.  This patient was previously seen in this clinic for diabetic management, last visit was in 2021. Her disease course has been worsening. There are no hypoglycemic associated symptoms. Pertinent negatives for hypoglycemia include no confusion, headaches, pallor or seizures. Associated symptoms include blurred vision,  fatigue, polydipsia and polyuria. Pertinent negatives for diabetes include no chest pain and no polyphagia. There are no hypoglycemic complications. Symptoms are worsening. Diabetic complications include heart disease and nephropathy. Risk factors for coronary artery disease include dyslipidemia, diabetes mellitus, hypertension, obesity, family history, post-menopausal, sedentary lifestyle and tobacco exposure. Current diabetic treatment includes insulin injections. Her weight is decreasing steadily. She is following a generally unhealthy diet. When asked about meal planning, she reported none. She has had a previous visit with a dietitian. She rarely participates in exercise. Her home blood glucose trend is fluctuating minimally. Her breakfast blood glucose range is generally 140-180 mg/dl. Her bedtime blood glucose range is generally 180-200 mg/dl. Her overall blood glucose range is 180-200 mg/dl. (She presents with her meter and logs showing slightly improving glycemic profile, still significantly above target.  Her recent A1c was 7.6%.  She did not document or report any hypoglycemia.  ) An ACE inhibitor/angiotensin II receptor blocker is being taken. Eye exam is current.  Hypertension This is a chronic problem. Associated symptoms include blurred vision.  Pertinent negatives include no chest pain, headaches, palpitations or shortness of breath. Risk factors for coronary artery disease include dyslipidemia, diabetes mellitus, family history, obesity, post-menopausal state, smoking/tobacco exposure and sedentary lifestyle. Past treatments include angiotensin blockers. Hypertensive end-organ damage includes CAD/MI. Identifiable causes of hypertension include chronic renal disease.     Review of Systems  Constitutional:  Positive for fatigue. Negative for chills, fever and unexpected weight change.  HENT:  Negative for trouble swallowing and voice change.   Eyes:  Positive for blurred vision. Negative for  visual disturbance.  Respiratory:  Negative for cough, shortness of breath and wheezing.   Cardiovascular:  Negative for chest pain, palpitations and leg swelling.  Gastrointestinal:  Negative for diarrhea, nausea and vomiting.  Endocrine: Positive for polydipsia and polyuria. Negative for cold intolerance, heat intolerance and polyphagia.  Musculoskeletal:  Negative for arthralgias and myalgias.  Skin:  Negative for color change, pallor, rash and wound.  Neurological:  Negative for seizures and headaches.  Psychiatric/Behavioral:  Negative for confusion and suicidal ideas.     Objective:       08/20/2022   10:58 AM 08/20/2022   10:44 AM 07/31/2022    9:58 AM  Vitals with BMI  Height  5\' 0"  5\' 0"   Weight  261 lbs 10 oz 264 lbs 13 oz  BMI  51.09 51.71  Systolic 134 142 440  Diastolic 68 58 76  Pulse  68 64    BP 134/68 Comment: L arm with manuel cuff  Pulse 68   Ht 5' (1.524 m)   Wt 261 lb 9.6 oz (118.7 kg)   BMI 51.09 kg/m   Wt Readings from Last 3 Encounters:  08/20/22 261 lb 9.6 oz (118.7 kg)  07/31/22 264 lb 12.8 oz (120.1 kg)  06/24/22 257 lb (116.6 kg)       CMP ( most recent) CMP     Component Value Date/Time   NA 137 06/03/2022 0937   K 4.1 06/03/2022 0937   CL 100 06/03/2022 0937   CO2 20 06/03/2022 0937   GLUCOSE 333 (H) 06/03/2022 0937   GLUCOSE 171 (H) 07/13/2021 0230   BUN 24 06/03/2022 0937   CREATININE 1.35 (H) 06/03/2022 0937   CREATININE 1.10 (H) 03/05/2019 1221   CALCIUM 9.4 06/03/2022 0937   PROT 7.1 07/05/2021 1400   PROT 6.9 01/11/2021 1014   ALBUMIN 3.3 (L) 07/05/2021 1400   ALBUMIN 4.1 01/11/2021 1014   AST 26 07/05/2021 1400   ALT 32 07/05/2021 1400   ALKPHOS 58 07/05/2021 1400   BILITOT 0.6 07/05/2021 1400   BILITOT 0.2 01/11/2021 1014   GFRNONAA 41 (L) 07/13/2021 0230   GFRNONAA 53 (L) 03/05/2019 1221   GFRAA 56 (L) 04/23/2019 1511   GFRAA 61 03/05/2019 1221     Diabetic Labs (most recent): Lab Results  Component Value Date    HGBA1C 7.6 06/03/2022   HGBA1C 7.4 (H) 07/05/2021   HGBA1C 6.6 11/08/2019   MICROALBUR 0.9 01/29/2018   MICROALBUR 1.5 07/10/2016     Lipid Panel ( most recent) Lipid Panel     Component Value Date/Time   CHOL 129 11/08/2019 0000   TRIG 158 11/08/2019 0000   HDL 60 11/08/2019 0000   CHOLHDL 3.8 03/05/2019 1221   VLDL 31 (H) 07/10/2016 0934   LDLCALC 43 11/08/2019 0000   LDLCALC 152 (H) 03/05/2019 1221      Lab Results  Component Value Date   TSH 1.49 03/05/2019   TSH 2.671 10/30/2018  TSH 3.05 04/24/2018   TSH 2.16 01/29/2018   TSH 3.71 07/11/2017   TSH 2.13 07/10/2016   TSH 1.766 05/12/2015   FREET4 1.2 03/05/2019   FREET4 1.05 10/30/2018   FREET4 1.2 01/29/2018   FREET4 1.4 07/11/2017   FREET4 1.0 07/10/2016   FREET4 1.22 05/12/2015       Assessment & Plan:   1. DM type 2 causing vascular disease  - Norma [name] has currently uncontrolled symptomatic type 2 DM since  72 years of age. She presents with her meter and logs showing slightly improving glycemic profile, still significantly above target.  Her recent A1c was 7.6%.  She did not document or report any hypoglycemia.  - Recent labs reviewed. - I had a long discussion with her about the possible risk factors and  the pathology behind its diabetes and its complications. -her diabetes is complicated by coronary artery disease, CKD, obesity/sedentary life, polypharmacy, and she remains at a high risk for more acute and chronic complications which include CAD, CVA, CKD, retinopathy, and neuropathy. These are all discussed in detail with her.  - I discussed all available options of managing her diabetes including de-escalation of medications. I have counseled her on Food as Medicine by adopting a Whole Food , Plant Predominant  ( WFPP) nutrition as recommended by [employer] of Lifestyle Medicine. Patient is encouraged to switch to  unprocessed or minimally processed  complex starch, adequate protein  intake (mainly plant source), minimal liquid fat, plenty of fruits, and vegetables. -  she is advised to stick to a routine mealtimes to eat 3 complete meals a day and snack only when necessary ( to snack only to correct hypoglycemia BG <70 day time or <100 at night).   - she acknowledges that there is a room for improvement in her food and drink choices. - Suggestion is made for her to avoid simple carbohydrates  from her diet including Cakes, Sweet Desserts, Ice Cream, Soda (diet and regular), Sweet Tea, Candies, Chips, Cookies, Store Bought Juices, Alcohol , Artificial Sweeteners,  Coffee Creamer, and "Sugar-free" Products, Lemonade. This will help patient to have more stable blood glucose profile and potentially avoid unintended weight gain.  The following Lifestyle Medicine recommendations according to American College of Lifestyle Medicine  [facility]) were discussed and and offered to patient and she  agrees to start the journey:  A. Whole Foods, Plant-Based Nutrition comprising of fruits and vegetables, plant-based proteins, whole-grain carbohydrates was discussed in detail with the patient.   A list for source of those nutrients were also provided to the patient.  Patient will use only water or unsweetened tea for hydration. B.  The need to stay away from risky substances including alcohol, smoking; obtaining 7 to 9 hours of restorative sleep, at least 150 minutes of moderate intensity exercise weekly, the importance of healthy social connections,  and stress management techniques were discussed. C.  A full color page of  Calorie density of various food groups per pound showing examples of each food groups was provided to the patient.   - she will be scheduled with Norm Salt, RDN, CDE for individualized diabetes education.  - I have approached her with the following individualized plan to manage  her diabetes and patient agrees:   - she he is advised to increase her Toujeo to 30 units  nightly,  associated with monitoring of blood glucose 4 times a day-before meals and at bedtime and return in 10-15 days for reevaluation. -This  patient will benefit from a CGM.  I discussed and prescribed the freestyle libre device for her. - In the meantime, she is advised to continue Jardiance 10 mg p.o. once a day at breakfast, glipizide 5 mg XL p.o. daily at breakfast.    - she is encouraged to call clinic for blood glucose levels less than 70 or above 300 mg /dl.  - Specific targets for  A1c;  LDL, HDL,  and Triglycerides were discussed with the patient.  2) Blood Pressure /Hypertension:   -Her blood pressure is controlled to target.   she is advised to continue her current medications including losartan 50 mg p.o. daily with breakfast . 3) Lipids/Hyperlipidemia:   Review of her recent lipid panel showed controlled LDL at 43.    she  is advised to continue    Repatha 140 mg subcutaneously every other week.   4)  Weight/Diet:  Body mass index is 51.09 kg/m.  -   clearly complicating her diabetes care.   she is  a candidate for weight loss. I discussed with her the fact that loss of 5 - 10% of her  current body weight will have the most impact on her diabetes management.  The above detailed  ACLM recommendations for nutrition, exercise, sleep, social life, avoidance of risky substances, the need for restorative sleep   information will also detailed on discharge instructions.  5) hypothyroidism: Longstanding diagnosis.  No recent thyroid function test to review.  She is advised to continue levothyroxine 112 mcg p.o. daily before breakfast.  - We discussed about the correct intake of her thyroid hormone, on empty stomach at fasting, with water, separated by at least 30 minutes from breakfast and other medications,  and separated by more than 4 hours from calcium, iron, multivitamins, acid reflux medications (PPIs). -Patient is made aware of the fact that thyroid hormone replacement is needed for  life, dose to be adjusted by periodic monitoring of thyroid function tests.   6) Chronic Care/Health Maintenance:  -she  is on ARB  medications and  is encouraged to initiate and continue to follow up with Ophthalmology, Dentist,  Podiatrist at least yearly or according to recommendations, and advised to   stay away from smoking. I have recommended yearly flu vaccine and pneumonia vaccine at least every 5 years; moderate intensity exercise for up to 150 minutes weekly; and  sleep for 7- 9 hours a day.  - she is  advised to maintain close follow up with [name], MD for primary care needs, as well as her other providers for optimal and coordinated care.   I spent  41  minutes in the care of the patient today including review of labs from CMP, Lipids, Thyroid Function, Hematology (current and previous including abstractions from other facilities); face-to-face time discussing  her blood glucose readings/logs, discussing hypoglycemia and hyperglycemia episodes and symptoms, medications doses, her options of short and long term treatment based on the latest standards of care / guidelines;  discussion about incorporating lifestyle medicine;  and documenting the encounter. Risk reduction counseling performed per USPSTF guidelines to reduce  obesity and cardiovascular risk factors.     Please refer to Patient Instructions for Blood Glucose Monitoring and Insulin/Medications Dosing Guide"  in media tab for additional information. Please  also refer to " Patient Self Inventory" in the Media  tab for reviewed elements of pertinent patient history.  Norma [name] participated in the discussions, expressed understanding, and voiced agreement with the  above plans.  All questions were answered to her satisfaction. she is encouraged to contact clinic should she have any questions or concerns prior to her return visit.    Follow up plan: - Return in about 9 weeks (around 10/22/2022) for F/U with Pre-visit  Labs, Meter/CGM/Logs, A1c here.  Marquis Lunch, MD [facility] [address] [place], [place] [address] Phone: [phone]  Fax: [phone]    08/20/2022, 6:29 PM  This note was partially dictated with voice recognition software. Similar sounding words can be transcribed inadequately or may not  be corrected upon review.

## 2022-08-20 NOTE — Patient Instructions (Signed)
                                     Advice for Weight Management  -For most of us the best way to lose weight is by diet management. Generally speaking, diet management means consuming less calories intentionally which over time brings about progressive weight loss.  This can be achieved more effectively by avoiding ultra processed carbohydrates, processed meats, unhealthy fats.    It is critically important to know your numbers: how much calorie you are consuming and how much calorie you need. More importantly, our carbohydrates sources should be unprocessed naturally occurring  complex starch food items.  It is always important to balance nutrition also by  appropriate intake of proteins (mainly plant-based), healthy fats/oils, plenty of fruits and vegetables.   -The American College of Lifestyle Medicine (ACL M) recommends nutrition derived mostly from Whole Food, Plant Predominant Sources example an apple instead of applesauce or apple pie. Eat Plenty of vegetables, Mushrooms, fruits, Legumes, Whole Grains, Nuts, seeds in lieu of processed meats, processed snacks/pastries red meat, poultry, eggs.  Use only water or unsweetened tea for hydration.  The College also recommends the need to stay away from risky substances including alcohol, smoking; obtaining 7-9 hours of restorative sleep, at least 150 minutes of moderate intensity exercise weekly, importance of healthy social connections, and being mindful of stress and seek help when it is overwhelming.    -Sticking to a routine mealtime to eat 3 meals a day and avoiding unnecessary snacks is shown to have a big role in weight control. Under normal circumstances, the only time we burn stored energy is when we are hungry, so allow  some hunger to take place- hunger means no food between appropriate meal times, only water.  It is not advisable to starve.   -It is better to avoid simple carbohydrates including:  Cakes, Sweet Desserts, Ice Cream, Soda (diet and regular), Sweet Tea, Candies, Chips, Cookies, Store Bought Juices, Alcohol in Excess of  1-2 drinks a day, Lemonade,  Artificial Sweeteners, Doughnuts, Coffee Creamers, "Sugar-free" Products, etc, etc.  This is not a complete list.....    -Consulting with certified diabetes educators is proven to provide you with the most accurate and current information on diet.  Also, you may be  interested in discussing diet options/exchanges , we can schedule a visit with Norma [name], RDN, CDE for individualized nutrition education.  -Exercise: If you are able: 30 -60 minutes a day ,4 days a week, or 150 minutes of moderate intensity exercise weekly.    The longer the better if tolerated.  Combine stretch, strength, and aerobic activities.  If you were told in the past that you have high risk for cardiovascular diseases, or if you are currently symptomatic, you may seek evaluation by your heart doctor prior to initiating moderate to intense exercise programs.                                  Additional Care Considerations for Diabetes/Prediabetes   -Diabetes  is a chronic disease.  The most important care consideration is regular follow-up with your diabetes care provider with the goal being avoiding or delaying its complications and to take advantage of advances in medications and technology.  If appropriate actions are taken early enough, type 2 diabetes can even be   reversed.  Seek information from the right source.  - Whole Food, Plant Predominant Nutrition is highly recommended: Eat Plenty of vegetables, Mushrooms, fruits, Legumes, Whole Grains, Nuts, seeds in lieu of processed meats, processed snacks/pastries red meat, poultry, eggs as recommended by American College of  Lifestyle Medicine (ACLM).  -Type 2 diabetes is known to coexist with other important comorbidities such as high blood pressure and high cholesterol.  It is critical to control not only the  diabetes but also the high blood pressure and high cholesterol to minimize and delay the risk of complications including coronary artery disease, stroke, amputations, blindness, etc.  The good news is that this diet recommendation for type 2 diabetes is also very helpful for managing high cholesterol and high blood blood pressure.  - Studies showed that people with diabetes will benefit from a class of medications known as ACE inhibitors and statins.  Unless there are specific reasons not to be on these medications, the standard of care is to consider getting one from these groups of medications at an optimal doses.  These medications are generally considered safe and proven to help protect the heart and the kidneys.    - People with diabetes are encouraged to initiate and maintain regular follow-up with eye doctors, foot doctors, dentists , and if necessary heart and kidney doctors.     - It is highly recommended that people with diabetes quit smoking or stay away from smoking, and get yearly  flu vaccine and pneumonia vaccine at least every 5 years.  See above for additional recommendations on exercise, sleep, stress management , and healthy social connections.      

## 2022-08-22 ENCOUNTER — Ambulatory Visit (INDEPENDENT_AMBULATORY_CARE_PROVIDER_SITE_OTHER): Payer: Medicare Other | Admitting: Gastroenterology

## 2022-08-22 ENCOUNTER — Encounter (INDEPENDENT_AMBULATORY_CARE_PROVIDER_SITE_OTHER): Payer: Self-pay | Admitting: Gastroenterology

## 2022-08-22 VITALS — BP 100/65 | HR 73 | Temp 98.2°F | Ht 60.0 in | Wt 265.9 lb

## 2022-08-22 DIAGNOSIS — K219 Gastro-esophageal reflux disease without esophagitis: Secondary | ICD-10-CM | POA: Diagnosis not present

## 2022-08-22 DIAGNOSIS — R1319 Other dysphagia: Secondary | ICD-10-CM | POA: Diagnosis not present

## 2022-08-22 DIAGNOSIS — D649 Anemia, unspecified: Secondary | ICD-10-CM | POA: Diagnosis not present

## 2022-08-22 NOTE — Progress Notes (Addendum)
Referring Provider: [name], MD Primary Care Physician:  [name], MD Primary GI Physician: Levon Hedger   Chief Complaint  Patient presents with   Dysphagia    Follow up on dysphagia. Reports food gets hung and Norma [name] can usually cough it up.    Anemia    Follow up on anemia. Takes iron about twice a week. Having fatigue and dizziness. States Norma [name] always has sob and it has not gotten worse.    HPI:   Norma [name] is a 72 y.o. female with past medical history of bipolar disorder, iron deficiency anemia, CKD, COPD, coronary artery disease status post CABG, depression, diabetes, diabetic neuropathy, hyperlipidemia, fatty liver, gastroparesis, GERD, hypothyroidism, IBS, morbid obesity, stroke, restless leg syndrome.   Patient presenting today for follow up of dysphagia and anemia.  Last seen November 2023, at that time virtually, doing pretty well. Norma [name] is having some constipation, usually does not have to take anything for this. Norma [name] is having a BM every day to every other day. Denies rectal bleeding, is on iron pills so stools are somewhat dark. nausea occurring very rarely. recently been on some abx for cellulitis and prednisone, some steroid injections for shoulder and knee pain. Norma [name] feels that Norma [name] has dysphagia only with more gooey breads and salty foods. Tends to have issues mostly with pills, almost daily, larger pills have issues passing. Norma [name] sometimes has to cough the pills back up and sometimes warm water will help them to pass.     being followed by Dr. Wolfgang Phoenix for IDA, Norma [name] previously talked to Dr. Levon Hedger about this and it was decided to hold off on EGD until after updated labs, Dr. Wolfgang Phoenix recommended Norma [name] also have EGD after updated iron studies. Norma [name] would like to continue with this plan for now.  Recommended Continue zofran PRN, EGD/Colonoscopy depending on iron studies    Last labs in February with iron 83, TIBC 338, iron sat 25%, ferritin 179, hgb 12.9  Present:  Norma [name]  states that Norma [name] has history of mini strokes and pinched nerve in her back and is having some issues with dizziness and back pain today. Dizziness Is not new. Norma [name] denies rectal bleeding or melena. Had some IV iron in September but none recently. Norma [name] is taking PO iron a few times per week. Norma [name] has SOB at baseline, no worsening of this or fatigue. Norma [name] is going to a diabetic support group and is trying to change her eating habits. No constipation or diarrhea. Norma [name] is not having upcoming labs in regards to her IDA until July   GERD well controlled. Norma [name] has had one episode of acid reflux a while back but no other issues. Norma [name] does well on protonix 40mg  once daily. Sometimes Norma [name] feels dysphagia is worse with certain foods such as grapes or other foods with peeling unless Norma [name] chews really well. Sometimes breads will cause an issue for her as well.   Esophagram 06/2021: Age-related esophageal dysmotility. Minimal laryngeal penetration without aspiration with mild residuals noted. Otherwise negative exam. Esophageal manometry: 2018 Incomplete relaxation of LES consistent with EG outflow obstruction.  Last EGD: 2018 - Normal esophagus. - Z-line irregular, 35 cm from the incisors. - 2 cm hiatal hernia. - Portal hypertensive gastropathy. Biopsied - pathology showed mild chronic gastritis but negative for H. pylori - Erythematous mucosa in the prepyloric region of the stomach. - Normal duodenal bulb and second portion of the duodenum.  Last Colonoscopy:2018 - normal     Past Medical  History:  Diagnosis Date   Anemia    Anxiety    Bipolar disorder (HCC)    Bulging lumbar disc    L3-4   Chronic daily headache    Chronic low back pain 09/20/2014   CKD (chronic kidney disease)    stage 3   COPD (chronic obstructive pulmonary disease) (HCC)    Coronary artery disease    CABG 2019   Degenerative arthritis    Depression    Diabetes mellitus, type II (HCC)    Diabetic neuropathy (HCC) 09/16/2018   DM  type 2 with diabetic peripheral neuropathy (HCC) 09/20/2014   Dyslipidemia    Dyspnea    Family history of adverse reaction to anesthesia    father had reaction to anesthesia medication per patient "they don't use it anymore"- unsure of reaction   Fatty liver    per pt report   Gastroparesis    GERD (gastroesophageal reflux disease)    Heart murmur    History of hiatal hernia    pt states Norma [name] no longer has this   Hypothyroidism    IBS (irritable bowel syndrome)    LBBB (left bundle branch block)    Memory difficulty 09/20/2014   Morbid obesity (HCC)    Myocardial infarction (HCC)    per pt- prior to CABG   Neuropathy    Obstructive sleep apnea on CPAP    Restless legs syndrome (RLS) 09/17/2012   Stroke (cerebrum) (HCC) 05/16/2017   Left parietal   Urticaria     Past Surgical History:  Procedure Laterality Date   ABDOMINAL HYSTERECTOMY     ARTHROSCOPY KNEE W/ DRILLING  06/2011   and Decemer of 2013.   Carpel tunnel  1980's   CATARACT EXTRACTION W/PHACO Right 03/21/2017   Procedure: CATARACT EXTRACTION PHACO AND INTRAOCULAR LENS PLACEMENT RIGHT EYE;  Surgeon: Fabio Pierce, MD;  Location: [facility];  Service: Ophthalmology;  Laterality: Right;  CDE: 2.91    CATARACT EXTRACTION W/PHACO Left 04/18/2017   Procedure: CATARACT EXTRACTION PHACO AND INTRAOCULAR LENS PLACEMENT LEFT EYE;  Surgeon: Fabio Pierce, MD;  Location: [facility];  Service: Ophthalmology;  Laterality: Left;  left   CHOLECYSTECTOMY     COLONOSCOPY WITH PROPOFOL N/A 10/04/2016   Procedure: COLONOSCOPY WITH PROPOFOL;  Surgeon: Malissa Hippo, MD;  Location: [facility];  Service: Endoscopy;  Laterality: N/A;  11:10   CORONARY ARTERY BYPASS GRAFT N/A 08/29/2017   Procedure: CORONARY ARTERY BYPASS GRAFTING (CABG) x 3 WITH ENDOSCOPIC HARVESTING OF RIGHT SAPHENOUS VEIN;  Surgeon: Kerin Perna, MD;  [facility];  Service: Open Heart Surgery;  Laterality: N/A;   ESOPHAGOGASTRODUODENOSCOPY N/A 04/25/2016    Procedure: ESOPHAGOGASTRODUODENOSCOPY (EGD);  Surgeon: Malissa Hippo, MD;  [facility];  Service: Endoscopy;  Laterality: N/A;  730   LEFT HEART CATH AND CORONARY ANGIOGRAPHY N/A 08/27/2017   Procedure: LEFT HEART CATH AND CORONARY ANGIOGRAPHY;  Surgeon: Tonny Bollman, MD;  Location: [facility]VAS[facility] CV LAB;  Service: Cardiovascular::  pLAD 95% - p-mLAD 50%, ostD1 90% -pD1 80%; ostOM1 90%; rPDA 80%. EF ~50-55% - HK of dital Anterolateral & Apical wall.  - Rec CVTS c/s   NECK SURGERY     pt reports having growth removed from the back of her neck- after 2021   OPEN REDUCTION INTERNAL FIXATION (ORIF) HAND Left 04/10/2020   Procedure: OPEN REDUCTION INTERNAL FIXATION (ORIF) HAND, left index finger;  Surgeon: Vickki Hearing, MD;  Location: [facility];  Service: Orthopedics;  Laterality: Left;  0.045 k wires   RECTAL SURGERY     fissure   SHOULDER SURGERY Left    arthroscopy in March of this year   TEE WITHOUT CARDIOVERSION N/A 08/29/2017   Procedure: TRANSESOPHAGEAL ECHOCARDIOGRAM (TEE);  Surgeon: Donata Clay, Theron Arista, MD;  Location: [facility] OR;  Service: Open Heart Surgery;  Laterality: N/A;   TRANSFORAMINAL LUMBAR INTERBODY FUSION W/ MIS 1 LEVEL Right 07/12/2021   Procedure: Minimally Invasive Surgery Transforaminal Lumbar Interbody Fusion  Lumbar four-five;  Surgeon: Bedelia Person, MD;  Location: [facility] OR;  Service: Neurosurgery;  Laterality: Right;   TRANSTHORACIC ECHOCARDIOGRAM  06/06/2017   Mild to moderate reduced EF 40 and 45%.  Anterior septal, inferoseptal and basal to mid inferior hypokinesis.  GR 1 DD.  No significant valvular lesion    Current Outpatient Medications  Medication Sig Dispense Refill   acetaminophen (TYLENOL) 500 MG tablet Take 1,000 mg by mouth every 6 (six) hours as needed.     albuterol (VENTOLIN HFA) 108 (90 Base) MCG/ACT inhaler Inhale into the lungs every 6 (six) hours as needed for wheezing or shortness of breath.     ALPRAZolam (XANAX) 0.25 MG tablet Take 1  tablet (0.25 mg total) by mouth at bedtime as needed for anxiety or sleep. 30 tablet 2   ARIPiprazole (ABILIFY) 5 MG tablet Take 1 tablet (5 mg total) by mouth daily. 30 tablet 2   aspirin EC 81 MG tablet Take 1 tablet (81 mg total) by mouth daily. 90 tablet 3   azelastine (ASTELIN) 0.1 % nasal spray Place into both nostrils 2 (two) times daily. Use in each nostril as directed     buPROPion (WELLBUTRIN XL) 150 MG 24 hr tablet Take 1 tablet (150 mg total) by mouth every morning. 30 tablet 2   carvedilol (COREG) 6.25 MG tablet Take 1 tablet (6.25 mg total) by mouth 2 (two) times daily with a meal. 180 tablet 2   Continuous Glucose Receiver (FREESTYLE LIBRE 3 READER) DEVI 1 Piece by Does not apply route once as needed for up to 1 dose. 1 each 0   Continuous Glucose Sensor (FREESTYLE LIBRE 3 SENSOR) MISC 1 Piece by Does not apply route every 14 (fourteen) days. Place 1 sensor on the skin every 14 days. Use to check glucose continuously 2 each 2   diclofenac Sodium (VOLTAREN) 1 % GEL Apply 2 g topically 4 (four) times daily as needed (pain).     diphenhydrAMINE (BENADRYL) 25 mg capsule Take 25 mg by mouth as needed for allergies or itching.     empagliflozin (JARDIANCE) 10 MG TABS tablet Take 1 tablet (10 mg total) by mouth daily. 90 tablet 1   ferrous sulfate 325 (65 FE) MG EC tablet Take 325 mg by mouth. One daily     gabapentin (NEURONTIN) 400 MG capsule Take 400 mg by mouth in the morning. & 800 mg at bedtime     glipiZIDE (GLUCOTROL XL) 5 MG 24 hr tablet Take 1 tablet (5 mg total) by mouth daily with breakfast. 90 tablet 1   lamoTRIgine (LAMICTAL) 150 MG tablet Take 1 tablet (150 mg total) by mouth at bedtime. 30 tablet 2   levothyroxine (SYNTHROID) 112 MCG tablet Take 112 mcg by mouth daily before breakfast.     losartan (COZAAR) 50 MG tablet Take 1 tablet (50 mg total) by mouth every morning. 90 tablet 1   meclizine (ANTIVERT) 25 MG tablet Take 1 tablet (25 mg total) by mouth 3 (three) times daily  as needed  for dizziness. (Patient taking differently: Take 25 mg by mouth daily as needed for dizziness.) 30 tablet 0   ondansetron (ZOFRAN) 4 MG tablet Take 1 tablet (4 mg total) by mouth every 8 (eight) hours as needed for nausea or vomiting. 20 tablet 0   OVER THE COUNTER MEDICATION daily as needed (pain). CBD ointment/roll on     pantoprazole (PROTONIX) 40 MG tablet Take 1 tablet by mouth daily before supper. 90 tablet 3   REPATHA SURECLICK 140 MG/ML SOAJ INJECT ONE DOSE INTO SKIN EVERY 14 DAYS. 2 mL 11   rOPINIRole (REQUIP) 3 MG tablet TAKE ONE TABLET BY MOUTH EVERY MORNING, ,NOON AND AT BEDTIME. (PRT PT MORNING,EVENING,BEDTIME) (Patient taking differently: Take 3 mg by mouth 3 (three) times daily.) 270 tablet 3   thiamine (VITAMIN B-1) 100 MG tablet Take 100 mg by mouth. Takes a couple times a week     tiZANidine (ZANAFLEX) 2 MG tablet Take 2 mg by mouth 2 (two) times daily.     TOUJEO SOLOSTAR 300 UNIT/ML Solostar Pen Inject 30 Units into the skin at bedtime.     venlafaxine XR (EFFEXOR-XR) 150 MG 24 hr capsule TAKE TWO (2) CAPSULES BY MOUTH AT BEDTIME 60 capsule 2   Vitamin D, Ergocalciferol, (DRISDOL) 1.25 MG (50000 UNIT) CAPS capsule Take 50,000 Units by mouth every 7 (seven) days. Sunday     EPINEPHrine 0.3 mg/0.3 mL IJ SOAJ injection Inject 0.3 mg into the muscle once. (Patient not taking: Reported on 08/22/2022)     fluticasone (FLONASE) 50 MCG/ACT nasal spray Place 1 spray into both nostrils daily. (Patient not taking: Reported on 07/31/2022)     No current facility-administered medications for this visit.    Allergies as of 08/22/2022 - Review Complete 08/22/2022  Allergen Reaction Noted   Amoxicillin Anaphylaxis and Other (See Comments) 04/15/2012   Hydrocodone Anaphylaxis 04/15/2012   Percocet [oxycodone-acetaminophen] Other (See Comments) 10/20/2018   Acetaminophen  11/29/2021   Depacon [valproate sodium]  05/07/2021   Depacon [valproic acid] Other (See Comments) 09/17/2012    Oxycodone  11/29/2021   Oxycodone hcl  11/29/2021   Prednisone Itching 02/14/2020    Family History  Problem Relation Age of Onset   Hypertension Mother    Lymphoma Mother    Depression Mother    Arthritis Father    Alcohol abuse Father    Hypertension Sister    Cancer Brother        kidney and lung   Alcohol abuse Brother    Alcohol abuse Paternal Uncle    Alcohol abuse Paternal Grandfather    Alcohol abuse Paternal Grandmother    Allergic rhinitis Neg Hx    Angioedema Neg Hx    Asthma Neg Hx    Atopy Neg Hx    Eczema Neg Hx    Immunodeficiency Neg Hx    Urticaria Neg Hx     Social History   Socioeconomic History   Marital status: Single    Spouse name: Not on file   Number of children: 0   Years of education: 16   Highest education level: Not on file  Occupational History    Employer: [employer]  Tobacco Use   Smoking status: Former    Types: Cigarettes    Quit date: 02/04/1971    Years since quitting: 51.5   Smokeless tobacco: Never   Tobacco comments:    smoked 2 cigarettes a day  Vaping Use   Vaping Use: Never used  Substance and Sexual  Activity   Alcohol use: No    Alcohol/week: 0.0 standard drinks of alcohol   Drug use: No   Sexual activity: Never  Other Topics Concern   Not on file  Social History Narrative   Patient lives at home alone.    Patient has no children.    Patient has her masters in nursing.    Patient is single.    Patient drinks about 2 glasses of tea daily.   Patient is right handed.   Social Determinants of Health   Financial Resource Strain: Medium Risk (12/18/2021)   Overall Financial Resource Strain (CARDIA)    Difficulty of Paying Living Expenses: Somewhat hard  Food Insecurity: No Food Insecurity (12/18/2021)   Hunger Vital Sign    Worried About Running Out of Food in the Last Year: Never true    Ran Out of Food in the Last Year: Never true  Transportation Needs: No Transportation Needs (12/18/2021)   PRAPARE -  Administrator, Civil Service (Medical): No    Lack of Transportation (Non-Medical): No  Physical Activity: Not on file  Stress: No Stress Concern Present (10/04/2021)   [employer] of Occupational Health - Occupational Stress Questionnaire    Feeling of Stress : Only a little  Social Connections: Not on file    Review of systems General: negative for night sweats, fever, chills, weight loss +fatigue Neck: Negative for lumps, goiter, pain and significant neck swelling Resp: Negative for cough, wheezing,+dyspnea with exertion CV: Negative for chest pain, leg swelling, palpitations, orthopnea GI: denies melena, hematochezia, nausea, vomiting, diarrhea, constipation, odyonophagia, early satiety or unintentional weight loss. +dysphagia  MSK: Negative for joint pain or swelling, back pain, and muscle pain. Derm: Negative for itching or rash Psych: Denies depression, anxiety, memory loss, confusion. No homicidal or suicidal ideation.  Heme: Negative for prolonged bleeding, bruising easily, and swollen nodes. Endocrine: Negative for cold or heat intolerance, polyuria, polydipsia and goiter. Neuro: negative for tremor, gait imbalance, syncope and seizures. The remainder of the review of systems is noncontributory.  Physical Exam: There were no vitals taken for this visit. General:   Alert and oriented. No distress noted. Pleasant and cooperative.  Head:  Normocephalic and atraumatic. Eyes:  Conjuctiva clear without scleral icterus. Mouth:  Oral mucosa pink and moist. Good dentition. No lesions. Heart: Normal rate and rhythm, s1 and s2 heart sounds present.  Lungs: Clear lung sounds in all lobes. Respirations equal and unlabored. Abdomen:  +BS, soft, non-tender and non-distended. No rebound or guarding. No HSM or masses noted. Derm: No palmar erythema or jaundice Msk:  Symmetrical without gross deformities. Normal posture. Extremities:  Without edema. Neurologic:  Alert and   oriented x4 Psych:  Alert and cooperative. Normal mood and affect.  Invalid input(s): "6 MONTHS"   ASSESSMENT: SIONA MONTAG is a 72 y.o. female presenting today for follow up of anemia/GERD/dysphagia.  GERD/Dysphagia: GERD feels well controlled on protonix 40mg  once daily, only occasional breakthrough. Norma [name] does feel that dysphagia is some worse especially with foods with peeling or breads. Will continue with daily protonix 40mg . Norma [name] had concern for EGJOO on esophageal manometry in 2018 and EGD around that time without esophageal abnormalities, recent esophagram with age related dysmotility. We discussed repeat EGD to rule out any other anatomic abnormalities that could be contributing given her dysphagia is worse, however, Norma [name] prefers to hold off at this time. Should continue with chewing precautions, avoiding trigger foods. Norma [name] will make me aware if her  symptoms worsen.  Anemia: no rectal bleeding or melena, takes PO iron a few times per week, last IV iron was in September. Iron studies and hemoglobin were WNL in February, however Norma [name] does note some dizziness, was notably SOB with walking into the clinic today. Will update h&h and iron studies to ensure no changes contributing to her symptoms.    PLAN:  H&h, Iron studies  2. Chewing precautions  3. Pt to make me aware if Norma [name] wants to pursue EGD  4. Continue with PPI daily.   All questions were answered, patient verbalized understanding and is in agreement with plan as outlined above.    Follow Up: 6 months   Odaliz Mcqueary L. Jeanmarie Hubert, MSN, APRN, AGNP-C Adult-Gerontology Nurse Practitioner [facility]  I have reviewed the note and agree with the APP's assessment as described in this progress note  Katrinka Blazing, MD Gastroenterology and Hepatology [facility]astroenterology

## 2022-08-22 NOTE — Patient Instructions (Signed)
We will check blood counts and iron levels to ensure these are normal  Continue to avoid foods you have issues swallowing, please make me aware if swallowing worsens or you wish to proceed with upper endoscopy for further evaluation  Continue with protonix 40mg  daily  Follow up 6 months  It was a pleasure to see you today. I want to create trusting relationships with patients and provide genuine, compassionate, and quality care. I truly value your feedback! please be on the lookout for a survey regarding your visit with me today. I appreciate your input about our visit and your time in completing this!    Bobbyjoe Pabst L. Jeanmarie Hubert, MSN, APRN, AGNP-C Adult-Gerontology Nurse Practitioner [facility] at [facility]

## 2022-08-27 ENCOUNTER — Ambulatory Visit: Payer: Medicare Other | Admitting: Cardiology

## 2022-08-27 DIAGNOSIS — L97521 Non-pressure chronic ulcer of other part of left foot limited to breakdown of skin: Secondary | ICD-10-CM | POA: Diagnosis not present

## 2022-08-27 DIAGNOSIS — D649 Anemia, unspecified: Secondary | ICD-10-CM | POA: Diagnosis not present

## 2022-09-06 ENCOUNTER — Telehealth: Payer: Self-pay | Admitting: Orthopedic Surgery

## 2022-09-06 NOTE — Telephone Encounter (Signed)
I can send to Dr Romeo Apple if you want to see when he wants to see her

## 2022-09-06 NOTE — Telephone Encounter (Signed)
Patient called wanting appointment with Dr Romeo Apple for her left ring finger she said its painful and locking up  Can I  use a same day spot for her next week

## 2022-09-07 ENCOUNTER — Other Ambulatory Visit (HOSPITAL_COMMUNITY): Payer: Self-pay | Admitting: Psychiatry

## 2022-09-07 DIAGNOSIS — Z6841 Body Mass Index (BMI) 40.0 and over, adult: Secondary | ICD-10-CM | POA: Diagnosis not present

## 2022-09-07 DIAGNOSIS — Z760 Encounter for issue of repeat prescription: Secondary | ICD-10-CM | POA: Diagnosis not present

## 2022-09-10 ENCOUNTER — Ambulatory Visit (HOSPITAL_COMMUNITY)
Admission: RE | Admit: 2022-09-10 | Discharge: 2022-09-10 | Disposition: A | Discharge status: Home / Self Care | Payer: Medicare Other | Source: Ambulatory Visit | Attending: Pulmonary Disease | Admitting: Pulmonary Disease

## 2022-09-10 ENCOUNTER — Ambulatory Visit (INDEPENDENT_AMBULATORY_CARE_PROVIDER_SITE_OTHER): Payer: Medicare Other | Admitting: Pulmonary Disease

## 2022-09-10 ENCOUNTER — Telehealth: Payer: Self-pay | Admitting: Pulmonary Disease

## 2022-09-10 ENCOUNTER — Encounter: Payer: Self-pay | Admitting: Pulmonary Disease

## 2022-09-10 VITALS — BP 106/66 | HR 87 | Ht 60.0 in | Wt 265.0 lb

## 2022-09-10 DIAGNOSIS — R0600 Dyspnea, unspecified: Secondary | ICD-10-CM | POA: Diagnosis not present

## 2022-09-10 DIAGNOSIS — R0609 Other forms of dyspnea: Secondary | ICD-10-CM | POA: Diagnosis not present

## 2022-09-10 DIAGNOSIS — G4733 Obstructive sleep apnea (adult) (pediatric): Secondary | ICD-10-CM

## 2022-09-10 NOTE — Progress Notes (Signed)
   Subjective:    Patient ID: Norma [name], female    DOB: 03-25-1951, 72 y.o.   MRN: [id number]  HPI 72 yo  minimal remote smoker for FU of nocturnal hypoxia and OSA, referred by neurology. She is a retired Engineer, civil (consulting), and reports diagnosis of OSA for more than 20 years maintained on CPAP.  DME is [facility] She was started on oxygen after sleep study in 2021- unclear cause of nocturnal hypoxia -lung function appears okay no evidence of ILD or pulm hypertension    PMH -diabetes, hypertension, CKD stage IIIb, spinal stenosis, bilateral TKR, CABG 2019,  HFpEF -Restless leg syndrome   Chief Complaint  Patient presents with   Follow-up    Pt f/u states that she has been having worsening SOB for 5mo. Uses 2L bled through CPAP qhs.    she has been having some chest pains but cardiology doctor states it is not due to cardio issues. Patient having increased SOB upon exertion to/from bathroom etc  Her oxygen saturation dropped to 91 on walking to the exam room today.  She has gained weight from 232 to 265 pounds After her last visit with me in March, diuretics were decreased to once a week.  BUN/in February was 24/1.4. Reviewed visits with cardiology and GI since then. She has nocturnal oxygen and is compliant  She obtained a CPAP machine from [facility] last time and now requests replacement.  She is very compliant machine   Significant tests/ events reviewed 04/2018 PFTs no airway obstruction with ratio 84, FEV1 1 to 2%, FVC 93%, TLC 100%, DLCO 72%.   06/2021 ONO on CPAP/room air >> desaturation for 4 hours ONO on CPAP/2 L oxygen showed desaturation for 54 minutes.  12/2020 ONO on CPAP/5 L oxygen showed no desaturation   09/2019 NPSG TST 340 minutes, AHI 9.2/hour, lowest desaturation 75%, desaturation less than 88% for 5 hours   10/2019 CPAP titration, 243 pounds, CPAP 14 cm with 2 L oxygen   CT chest 05/2016 no ILD   Review of Systems neg for any significant sore throat, dysphagia, itching,  sneezing, nasal congestion or excess/ purulent secretions, fever, chills, sweats, unintended wt loss, pleuritic or exertional cp, hempoptysis, orthopnea pnd or change in chronic leg swelling. Also denies presyncope, palpitations, heartburn, abdominal pain, nausea, vomiting, diarrhea or change in bowel or urinary habits, dysuria,hematuria, rash, arthralgias, visual complaints, headache, numbness weakness or ataxia.     Objective:   Physical Exam  Gen. Pleasant, obese, in no distress ENT - no lesions, no post nasal drip Neck: No JVD, no thyromegaly, no carotid bruits Lungs: no use of accessory muscles, no dullness to percussion, decreased without rales or rhonchi  Cardiovascular: Rhythm regular, heart sounds  normal, no murmurs or gallops, 2+ peripheral edema Musculoskeletal: No deformities, no cyanosis or clubbing , no tremors       Assessment & Plan:

## 2022-09-10 NOTE — Assessment & Plan Note (Signed)
Replacement auto CPAP 10-15 send will be ordered. She is very compliant and CPAP is only helped improve her daytime somnolence and fatigue  Weight loss encouraged, compliance with goal of at least 4-6 hrs every night is the expectation. Advised against medications with sedative side effects Cautioned against driving when sleepy - understanding that sleepiness will vary on a day to day basis

## 2022-09-10 NOTE — Patient Instructions (Addendum)
X Check BMET/BNP/ D-dimer  & CXR today  Take toresemide 20 mg daily x 2 weeks  X Repeat BMET in 2 weeks  Once weight down to 255, take toresemide thrice/week - q m/W/F  X Rx for autoCPAP 10-15 cm

## 2022-09-10 NOTE — Assessment & Plan Note (Signed)
Dyspnea has significantly increased and she is desaturating on exertion.  Previously only had nocturnal hypoxia.  This is very likely related to acute on chronic diastolic heart failure.  She has gained 35 pounds weight since last year.  Her diuretics were discontinued 3 months ago.  I have asked her to restart daily torsemide.  We will obtain baseline renal function and BNP and D-dimer.  If D-dimer is elevated I will to obtain a study to rule out pulmonary emboli although this seems to be low likelihood. She has appointment with cardiology scheduled for next month. We will repeat BMET in 2 weeks and advise about dosing of torsemide

## 2022-09-10 NOTE — Telephone Encounter (Signed)
[facility] in [place]  Needs refill on the following on AVS:   Take toresemide 20 mg daily x 2 weeks

## 2022-09-10 NOTE — Telephone Encounter (Signed)
I called her to advise 215 on Thursday had to leave message for her.

## 2022-09-12 ENCOUNTER — Ambulatory Visit: Payer: Medicare Other | Admitting: Orthopedic Surgery

## 2022-09-12 LAB — BASIC METABOLIC PANEL
BUN/Creatinine Ratio: 15 (ref 12–28)
BUN: 25 mg/dL (ref 8–27)
CO2: 21 mmol/L (ref 20–29)
Calcium: 9.2 mg/dL (ref 8.7–10.3)
Chloride: 100 mmol/L (ref 96–106)
Creatinine, Ser: 1.64 mg/dL — ABNORMAL HIGH (ref 0.57–1.00)
Glucose: 249 mg/dL — ABNORMAL HIGH (ref 70–99)
Potassium: 4.5 mmol/L (ref 3.5–5.2)
Sodium: 138 mmol/L (ref 134–144)
eGFR: 34 mL/min/{1.73_m2} — ABNORMAL LOW (ref >59)

## 2022-09-12 LAB — BRAIN NATRIURETIC PEPTIDE: BNP: 26.9 pg/mL (ref 0.0–100.0)

## 2022-09-12 LAB — D-DIMER, QUANTITATIVE: D-DIMER: 0.71 mg/L FEU — ABNORMAL HIGH (ref 0.00–0.49)

## 2022-09-12 NOTE — Telephone Encounter (Signed)
Pt calling in bc she needs a refill for torsomide  Pharmacy: [facility] in [place]

## 2022-09-14 ENCOUNTER — Encounter: Payer: Medicare Other | Admitting: Physician Assistant

## 2022-09-16 MED ORDER — TORSEMIDE 10 MG PO TABS: 20.0000 mg | ORAL_TABLET | Freq: Every day | ORAL | 0 refills | Status: DC

## 2022-09-16 NOTE — Progress Notes (Signed)
Once I logged in to see the patient, patient logged out and did not return. Unable to connect.   Jodell Cipro Ward, PA-C  [phone]

## 2022-09-16 NOTE — Telephone Encounter (Signed)
Called and spoke w/ pt let her know that I sent in the refill on her torsemide (20mg  for 2 weeks per RA) - NFN at time of call.

## 2022-09-16 NOTE — Telephone Encounter (Signed)
Patient checking on message for Toresemide. Patient phone number is [phone].

## 2022-09-17 DIAGNOSIS — L97521 Non-pressure chronic ulcer of other part of left foot limited to breakdown of skin: Secondary | ICD-10-CM | POA: Diagnosis not present

## 2022-09-19 ENCOUNTER — Telehealth: Payer: Self-pay | Admitting: Pulmonary Disease

## 2022-09-19 ENCOUNTER — Ambulatory Visit: Payer: Medicare Other | Attending: Cardiology | Admitting: Cardiology

## 2022-09-19 VITALS — BP 124/70 | HR 81 | Ht 60.0 in | Wt 261.0 lb

## 2022-09-19 DIAGNOSIS — I5032 Chronic diastolic (congestive) heart failure: Secondary | ICD-10-CM | POA: Insufficient documentation

## 2022-09-19 DIAGNOSIS — R0609 Other forms of dyspnea: Secondary | ICD-10-CM

## 2022-09-19 DIAGNOSIS — I2510 Atherosclerotic heart disease of native coronary artery without angina pectoris: Secondary | ICD-10-CM | POA: Insufficient documentation

## 2022-09-19 NOTE — Telephone Encounter (Signed)
Please follow up on VQ scan for her I ordered

## 2022-09-19 NOTE — Telephone Encounter (Signed)
-----   Message from Antoine Poche, MD sent at 09/19/2022 11:27 AM EDT ----- [facility] patient, Saw you guys had planned on a VQ, she has not heard about this being scheduled as of yet. She did not start her diuretic yet since your visit, will start today. We will follow her closely, tough to determine precisely her volume status, she may be a lady we send for a right heart cath pending how she does with initial attempt with diuresis particularly if we run into issues with progression of her renal dysfunction on torsemide. Coming back to see [place] in about 2 weeks  Dominga Ferry MD

## 2022-09-19 NOTE — Patient Instructions (Addendum)
Medication Instructions:  Your physician recommends that you continue on your current medications as directed. Please refer to the Current Medication list given to you today.  Labwork: BMET & Magnesium in 1 week (09/26/2022) Non-fasting [facility] ([address] [place]) or [facility]  Testing/Procedures: none  Follow-Up: Your physician recommends that you schedule a follow-up appointment in: 2 weeks  Any Other Special Instructions Will Be Listed Below (If Applicable).  If you need a refill on your cardiac medications before your next appointment, please call your pharmacy.

## 2022-09-19 NOTE — Progress Notes (Signed)
Clinical Summary Norma [name] is a 72 y.o.female seen today for follow up of the following medical problems.    1. Chronic diastolic HF 01/2018 echo VLEF 16-10%, grade I diastolic dysfunction.  -10/2020 echo LVEF 50-55%    - mild infrequent edema, no SOB/DOE - compliant with meds. Takes the torsemide prn, has not needed in quite some time. In general trying to limit due to her kidney dysfunction.     05/2022 echo: LVEF 55-60%, no WMAs, normal diastolic fxn, normal RV, normal PASP  - 09/10/22 pulm appt noted desats on exertion, increased DOE. Thought HF, restarted on torsemide - CXR no acute process. BNP 27. Cr 1.64 - ddimer elevated, plans were for VQ scan   - increased SOB over the last months - DOE with activities, some orthopnea - DOE with walking from parking lot into office. - no recent wheezing, no cough - some chest pain - occasoinal LE edema - weight 26 at pulm appt. In March 259 lbs. July of last year 225.  -she reports neprhology had asked her to only take torsemide when absolutely needed.  - has not started taking daily yet, was to start taking 20mg  daily.    2. CAD - 08/2017 cath for unstable angina: LAD 95%, D1 90%, RPDA 80%, OM 90%.  - CT surgery was consulted. 08/30/17 CABG with sequential SVG to diag and LAD, SVG-OM.   -  stopped lopressor due to fatigue.        - recent chest pain. Slight pressure midchest, radiating right and left. 1-2/10 in severity. Would occur at rest. Lasted about 30 seconds, resolved on its own. Isolated episode.      Other medical problems not addressed this visit   3. Chronic LBBB     4. Hyperlipidemia -intolerant to statins - followed in lipid clinic.   - she is on repatha, LDL 152-->43 on repatha.    - compliant with meds     5. OSA - on cpap, followed by Dr [name]   6. CKD 3b - followed Dr Wolfgang Phoenix     7.Back surgery 07/12/21  8. Carotid stenosis - 07/2022 carotid US 1-39% bilateral disease     Past Medical  History:  Diagnosis Date   Anemia    Anxiety    Bipolar disorder (HCC)    Bulging lumbar disc    L3-4   Chronic daily headache    Chronic low back pain 09/20/2014   CKD (chronic kidney disease)    stage 3   COPD (chronic obstructive pulmonary disease) (HCC)    Coronary artery disease    CABG 2019   Degenerative arthritis    Depression    Diabetes mellitus, type II (HCC)    Diabetic neuropathy (HCC) 09/16/2018   DM type 2 with diabetic peripheral neuropathy (HCC) 09/20/2014   Dyslipidemia    Dyspnea    Family history of adverse reaction to anesthesia    father had reaction to anesthesia medication per patient "they don't use it anymore"- unsure of reaction   Fatty liver    per pt report   Gastroparesis    GERD (gastroesophageal reflux disease)    Heart murmur    History of hiatal hernia    pt states she no longer has this   Hypothyroidism    IBS (irritable bowel syndrome)    LBBB (left bundle Simpson Paulos block)    Memory difficulty 09/20/2014   Morbid obesity (HCC)    Myocardial infarction (HCC)  per pt- prior to CABG   Neuropathy    Obstructive sleep apnea on CPAP    Restless legs syndrome (RLS) 09/17/2012   Stroke (cerebrum) (HCC) 05/16/2017   Left parietal   Urticaria      Allergies  Allergen Reactions   Amoxicillin Anaphylaxis and Other (See Comments)    Has patient had a PCN reaction causing immediate rash, facial/tongue/throat swelling, SOB or lightheadedness with hypotension: Yes Has patient had a PCN reaction causing severe rash involving mucus membranes or skin necrosis: Yes Has patient had a PCN reaction that required hospitalization: Yes Has patient had a PCN reaction occurring within the last 10 years: Yes If all of the above answers are "NO", then may proceed with Cephalosporin use.    Hydrocodone Anaphylaxis   Percocet [Oxycodone-Acetaminophen] Other (See Comments)    Causes chest pain /tightness. Pressure pain around her ribs.   Acetaminophen     Depacon [Valproate Sodium]    Depacon [Valproic Acid] Other (See Comments)    Causes falls    Oxycodone    Oxycodone Hcl    Prednisone Itching     Current Outpatient Medications  Medication Sig Dispense Refill   albuterol (VENTOLIN HFA) 108 (90 Base) MCG/ACT inhaler Inhale into the lungs every 6 (six) hours as needed for wheezing or shortness of breath.     ALPRAZolam (XANAX) 0.25 MG tablet Take 1 tablet (0.25 mg total) by mouth at bedtime as needed for anxiety or sleep. 30 tablet 2   ARIPiprazole (ABILIFY) 5 MG tablet Take 1 tablet (5 mg total) by mouth daily. 30 tablet 2   aspirin EC 81 MG tablet Take 1 tablet (81 mg total) by mouth daily. 90 tablet 3   azelastine (ASTELIN) 0.1 % nasal spray Place into both nostrils 2 (two) times daily. Use in each nostril as directed     buPROPion (WELLBUTRIN XL) 150 MG 24 hr tablet Take 1 tablet (150 mg total) by mouth every morning. 30 tablet 2   carvedilol (COREG) 6.25 MG tablet Take 1 tablet (6.25 mg total) by mouth 2 (two) times daily with a meal. 180 tablet 2   diclofenac Sodium (VOLTAREN) 1 % GEL Apply 2 g topically 4 (four) times daily as needed (pain).     diphenhydrAMINE (BENADRYL) 25 mg capsule Take 25 mg by mouth as needed for allergies or itching.     empagliflozin (JARDIANCE) 10 MG TABS tablet Take 1 tablet (10 mg total) by mouth daily. 90 tablet 1   EPINEPHrine 0.3 mg/0.3 mL IJ SOAJ injection Inject 0.3 mg into the muscle once.     ferrous sulfate 325 (65 FE) MG EC tablet Take 325 mg by mouth. One daily     gabapentin (NEURONTIN) 400 MG capsule Take 400 mg by mouth in the morning. & 800 mg at bedtime     glipiZIDE (GLUCOTROL XL) 5 MG 24 hr tablet Take 1 tablet (5 mg total) by mouth daily with breakfast. 90 tablet 1   lamoTRIgine (LAMICTAL) 150 MG tablet Take 1 tablet (150 mg total) by mouth at bedtime. 30 tablet 2   levothyroxine (SYNTHROID) 112 MCG tablet Take 112 mcg by mouth daily before breakfast.     losartan (COZAAR) 50 MG tablet  Take 1 tablet (50 mg total) by mouth every morning. 90 tablet 1   meclizine (ANTIVERT) 25 MG tablet Take 1 tablet (25 mg total) by mouth 3 (three) times daily as needed for dizziness. (Patient taking differently: Take 25 mg by mouth daily as  needed for dizziness.) 30 tablet 0   ondansetron (ZOFRAN) 4 MG tablet Take 1 tablet (4 mg total) by mouth every 8 (eight) hours as needed for nausea or vomiting. 20 tablet 0   OVER THE COUNTER MEDICATION daily as needed (pain). CBD ointment/roll on     pantoprazole (PROTONIX) 40 MG tablet Take 1 tablet by mouth daily before supper. 90 tablet 3   REPATHA SURECLICK 140 MG/ML SOAJ INJECT ONE DOSE INTO SKIN EVERY 14 DAYS. 2 mL 11   rOPINIRole (REQUIP) 3 MG tablet TAKE ONE TABLET BY MOUTH EVERY MORNING, ,NOON AND AT BEDTIME. (PRT PT MORNING,EVENING,BEDTIME) (Patient taking differently: Take 3 mg by mouth 3 (three) times daily.) 270 tablet 3   tiZANidine (ZANAFLEX) 2 MG tablet Take 2 mg by mouth 2 (two) times daily.     torsemide (DEMADEX) 10 MG tablet Take 2 tablets (20 mg total) by mouth daily. 28 tablet 0   TOUJEO SOLOSTAR 300 UNIT/ML Solostar Pen Inject 30 Units into the skin at bedtime.     venlafaxine XR (EFFEXOR-XR) 150 MG 24 hr capsule TAKE TWO (2) CAPSULES BY MOUTH AT BEDTIME 60 capsule 2   Vitamin D, Ergocalciferol, (DRISDOL) 1.25 MG (50000 UNIT) CAPS capsule Take 50,000 Units by mouth every 7 (seven) days. Sunday     No current facility-administered medications for this visit.     Past Surgical History:  Procedure Laterality Date   ABDOMINAL HYSTERECTOMY     ARTHROSCOPY KNEE W/ DRILLING  06/2011   and Decemer of 2013.   Carpel tunnel  1980's   CATARACT EXTRACTION W/PHACO Right 03/21/2017   Procedure: CATARACT EXTRACTION PHACO AND INTRAOCULAR LENS PLACEMENT RIGHT EYE;  Surgeon: Fabio Pierce, MD;  Location: [facility];  Service: Ophthalmology;  Laterality: Right;  CDE: 2.91    CATARACT EXTRACTION W/PHACO Left 04/18/2017   Procedure: CATARACT  EXTRACTION PHACO AND INTRAOCULAR LENS PLACEMENT LEFT EYE;  Surgeon: Fabio Pierce, MD;  Location: [facility];  Service: Ophthalmology;  Laterality: Left;  left   CHOLECYSTECTOMY     COLONOSCOPY WITH PROPOFOL N/A 10/04/2016   Procedure: COLONOSCOPY WITH PROPOFOL;  Surgeon: Malissa Hippo, MD;  Location: [facility];  Service: Endoscopy;  Laterality: N/A;  11:10   CORONARY ARTERY BYPASS GRAFT N/A 08/29/2017   Procedure: CORONARY ARTERY BYPASS GRAFTING (CABG) x 3 WITH ENDOSCOPIC HARVESTING OF RIGHT SAPHENOUS VEIN;  Surgeon: Kerin Perna, MD;  Location: [facility] OR;  Service: Open Heart Surgery;  Laterality: N/A;   ESOPHAGOGASTRODUODENOSCOPY N/A 04/25/2016   Procedure: ESOPHAGOGASTRODUODENOSCOPY (EGD);  Surgeon: Malissa Hippo, MD;  Location: [facility];  Service: Endoscopy;  Laterality: N/A;  730   LEFT HEART CATH AND CORONARY ANGIOGRAPHY N/A 08/27/2017   Procedure: LEFT HEART CATH AND CORONARY ANGIOGRAPHY;  Surgeon: Tonny Bollman, MD;  Location: [facility] INVASIVE CV LAB;  Service: Cardiovascular::  pLAD 95% - p-mLAD 50%, ostD1 90% -pD1 80%; ostOM1 90%; rPDA 80%. EF ~50-55% - HK of dital Anterolateral & Apical wall.  - Rec CVTS c/s   NECK SURGERY     pt reports having growth removed from the back of her neck- after 2021   OPEN REDUCTION INTERNAL FIXATION (ORIF) HAND Left 04/10/2020   Procedure: OPEN REDUCTION INTERNAL FIXATION (ORIF) HAND, left index finger;  Surgeon: Vickki Hearing, MD;  Location: [facility];  Service: Orthopedics;  Laterality: Left;  0.045 k wires   RECTAL SURGERY     fissure   SHOULDER SURGERY Left    arthroscopy in March of this year  TEE WITHOUT CARDIOVERSION N/A 08/29/2017   Procedure: TRANSESOPHAGEAL ECHOCARDIOGRAM (TEE);  Surgeon: Donata Clay, Theron Arista, MD;  Location: [facility] OR;  Service: Open Heart Surgery;  Laterality: N/A;   TRANSFORAMINAL LUMBAR INTERBODY FUSION W/ MIS 1 LEVEL Right 07/12/2021   Procedure: Minimally Invasive Surgery Transforaminal Lumbar Interbody Fusion  Lumbar  four-five;  Surgeon: Bedelia Person, MD;  Location: [facility] OR;  Service: Neurosurgery;  Laterality: Right;   TRANSTHORACIC ECHOCARDIOGRAM  06/06/2017   Mild to moderate reduced EF 40 and 45%.  Anterior septal, inferoseptal and basal to mid inferior hypokinesis.  GR 1 DD.  No significant valvular lesion     Allergies  Allergen Reactions   Amoxicillin Anaphylaxis and Other (See Comments)    Has patient had a PCN reaction causing immediate rash, facial/tongue/throat swelling, SOB or lightheadedness with hypotension: Yes Has patient had a PCN reaction causing severe rash involving mucus membranes or skin necrosis: Yes Has patient had a PCN reaction that required hospitalization: Yes Has patient had a PCN reaction occurring within the last 10 years: Yes If all of the above answers are "NO", then may proceed with Cephalosporin use.    Hydrocodone Anaphylaxis   Percocet [Oxycodone-Acetaminophen] Other (See Comments)    Causes chest pain /tightness. Pressure pain around her ribs.   Acetaminophen    Depacon [Valproate Sodium]    Depacon [Valproic Acid] Other (See Comments)    Causes falls    Oxycodone    Oxycodone Hcl    Prednisone Itching      Family History  Problem Relation Age of Onset   Hypertension Mother    Lymphoma Mother    Depression Mother    Arthritis Father    Alcohol abuse Father    Hypertension Sister    Cancer Brother        kidney and lung   Alcohol abuse Brother    Alcohol abuse Paternal Uncle    Alcohol abuse Paternal Grandfather    Alcohol abuse Paternal Grandmother    Allergic rhinitis Neg Hx    Angioedema Neg Hx    Asthma Neg Hx    Atopy Neg Hx    Eczema Neg Hx    Immunodeficiency Neg Hx    Urticaria Neg Hx      Social History Ms. Horsford reports that she quit smoking about 51 years ago. Her smoking use included cigarettes. She has never used smokeless tobacco. Ms. Wambolt reports no history of alcohol use.   Review of Systems CONSTITUTIONAL: No  weight loss, fever, chills, weakness or fatigue.  HEENT: Eyes: No visual loss, blurred vision, double vision or yellow sclerae.No hearing loss, sneezing, congestion, runny nose or sore throat.  SKIN: No rash or itching.  CARDIOVASCULAR: per hpi RESPIRATORY: No shortness of breath, cough or sputum.  GASTROINTESTINAL: No anorexia, nausea, vomiting or diarrhea. No abdominal pain or blood.  GENITOURINARY: No burning on urination, no polyuria NEUROLOGICAL: No headache, dizziness, syncope, paralysis, ataxia, numbness or tingling in the extremities. No change in bowel or bladder control.  MUSCULOSKELETAL: No muscle, back pain, joint pain or stiffness.  LYMPHATICS: No enlarged nodes. No history of splenectomy.  PSYCHIATRIC: No history of depression or anxiety.  ENDOCRINOLOGIC: No reports of sweating, cold or heat intolerance. No polyuria or polydipsia.  [name]   Physical Examination Today's Vitals   09/19/22 1042  BP: 124/70  Pulse: 81  SpO2: 92%  Weight: 261 lb (118.4 kg)  Height: 5' (1.524 m)   Body mass index is 50.97 kg/m.  Gen: resting  comfortably, no acute distress HEENT: no scleral icterus, pupils equal round and reactive, no palptable cervical adenopathy,  CV: RRR, no mrg, no jvd Resp: Clear to auscultation bilaterally GI: abdomen is soft, non-tender, non-distended, normal bowel sounds, no hepatosplenomegaly MSK: extremities are warm, trace bilateral edema Skin: warm, no rash Neuro:  no focal deficits Psych: appropriate affect   Diagnostic Studies  03/2016 Lexiscan Probable normal perfusion and mild soft tissue attenuation (breast) No significant ischemia or evidence for scar The left ventricular ejection fraction is normal (55-65%). Low risk scan.   08/2017 cath RPDA lesion is 80% stenosed. Ost 1st Mrg to 1st Mrg lesion is 90% stenosed. Prox LAD lesion is 95% stenosed. Prox LAD to Mid LAD lesion is 50% stenosed. Ost 1st Diag lesion is 90% stenosed. 1st Diag lesion is 80%  stenosed. The left ventricular ejection fraction is 50-55% by visual estimate. There is mild left ventricular systolic dysfunction.   1.  Severe multivessel coronary artery disease with severe stenosis of the proximal LAD/first diagonal bifurcation, first obtuse marginal Arsh Feutz of the circumflex, and PDA Fatoumata Albaugh of the RCA. 2.  Mild segmental LV systolic dysfunction with hypokinesis of the distal anterolateral and apical walls, LVEF estimated at 50 to 55%.   08/2017 TEE Aortic valve: The valve is trileaflet. No stenosis. No regurgitation. Mitral valve: Mild regurgitation. Central jet. Right ventricle: Normal cavity size, wall thickness and ejection fraction. Pericardium: Trivial pericardial effusion. Tricuspid valve: No regurgitation Pulmonic valve: No regurgitation by color doppler. Left ventricle: Regional wall motion abnormalities present, dyskinetic basal and apical septal wall, hypokinesis of lateral wall and inferoseptal wall. Increased wall thickness. LVEF 35-40%. No ASD/PFO present Unable to rule out thrombus in LAA but velocities make thrombus less likely.   05/2017 echo Study Conclusions   - Left ventricle: The cavity size was normal. There was mild   concentric hypertrophy. Systolic function was mildly to   moderately reduced. The estimated ejection fraction was in the   range of 40% to 45%. Hypokinesis of the anteroseptal,   inferoseptal and basal to mid inferior myocardium. Doppler   parameters are consistent with abnormal left ventricular   relaxation (grade 1 diastolic dysfunction). Doppler parameters   are consistent with indeterminate ventricular filling pressure. - Aortic valve: Transvalvular velocity was within the normal range.   There was no stenosis. There was no regurgitation. - Mitral valve: Transvalvular velocity was within the normal range.   There was no evidence for stenosis. There was no regurgitation. - Right ventricle: The cavity size was normal. Wall  thickness was   normal. Systolic function was normal. - Tricuspid valve: There was trivial regurgitation. - Global longitudinal strain -11.9%.     01/2018 echo Study Conclusions   - Left ventricle: The cavity size was normal. Wall thickness was   increased in a pattern of mild LVH. Systolic function was normal.   The estimated ejection fraction was in the range of 50% to 55%.   Regional wall motion abnormalities cannot be excluded. Doppler   parameters are consistent with abnormal left ventricular   relaxation (grade 1 diastolic dysfunction). - Ventricular septum: Septal motion showed abnormal function and   dyssynergy. These changes are consistent with a left bundle   Yuka Lallier block. - Mitral valve: Mildly thickened leaflets .     10/2020 echo IMPRESSIONS     1. Markedly abnormal septal motion ? from BBB. Left ventricular ejection  fraction, by estimation, is 50 to 55%. The left ventricle has low normal  function.  The left ventricle has no regional wall motion abnormalities.  Left ventricular diastolic  parameters were normal.   2. Right ventricular systolic function is normal. The right ventricular  size is normal.   3. The mitral valve is normal in structure. Trivial mitral valve  regurgitation. No evidence of mitral stenosis.   4. The aortic valve is tricuspid. There is mild calcification of the  aortic valve. Aortic valve regurgitation is not visualized. Mild aortic  valve sclerosis is present, with no evidence of aortic valve stenosis.   5. The inferior vena cava is normal in size with greater than 50%  respiratory variability, suggesting right atrial pressure of 3 mmHg.      05/2022 echo 1. Left ventricular ejection fraction, by estimation, is 55 to 60%. The  left ventricle has normal function. The left ventricle has no regional  wall motion abnormalities. Left ventricular diastolic parameters were  normal. The average left ventricular  global longitudinal strain is  -23.2 %. The global longitudinal strain is  normal.   2. Right ventricular systolic function is normal. The right ventricular  size is normal. There is normal pulmonary artery systolic pressure. The  estimated right ventricular systolic pressure is 28.4 mmHg.   3. The mitral valve is grossly normal. Trivial mitral valve  regurgitation.   4. The aortic valve is tricuspid. There is mild calcification of the  aortic valve. Aortic valve regurgitation is not visualized. Aortic valve  sclerosis/calcification is present, without any evidence of aortic  stenosis.   5. The inferior vena cava is normal in size with greater than 50%  respiratory variability, suggesting right atrial pressure of 3 mmHg.     Assessment and Plan  1. Chronic diastolic HF -10/2021 wt 225, at 05/2022 appt 258 lbs. Fairly stable in that range over the last few months - exam difficult to assess fluid status based on body habitus. CXR and BNP were benign. - significant SOB/DOE, some hypoxia with walking at pulm appt. Some orthopnea.  - in general difficult to assess how much weight gain could be fluid and if playing significant role in her symptoms.  - have to be careful with diuretics given her CKD. Was to start torsemide 20mg  daily back after her pulm appt but has not started yet  - will start torsemide 20mg  daily. Check bmet/mg in 1 weeks - f/u 2 weeks. Pending response to diuresis may need to consider RHC to see where we are volume wise, difficult to determine as listed above. Would particularly have a low thresthold for RHC if progression of renal dysfunction back on diuretic.   2.History of CAD - atypical chest pain, monitor at this time      Antoine Poche, M.D.,

## 2022-09-23 ENCOUNTER — Ambulatory Visit: Payer: Medicare Other | Admitting: Orthopedic Surgery

## 2022-09-25 ENCOUNTER — Other Ambulatory Visit: Payer: Self-pay | Admitting: Pulmonary Disease

## 2022-09-25 ENCOUNTER — Encounter (HOSPITAL_COMMUNITY): Payer: Self-pay

## 2022-09-25 ENCOUNTER — Telehealth: Payer: Self-pay | Admitting: Pulmonary Disease

## 2022-09-25 ENCOUNTER — Telehealth: Payer: Self-pay

## 2022-09-25 ENCOUNTER — Encounter (HOSPITAL_COMMUNITY)
Admission: RE | Admit: 2022-09-25 | Discharge: 2022-09-25 | Disposition: A | Discharge status: Home / Self Care | Payer: Medicare Other | Source: Ambulatory Visit | Attending: Pulmonary Disease | Admitting: Pulmonary Disease

## 2022-09-25 ENCOUNTER — Ambulatory Visit (HOSPITAL_COMMUNITY)
Admission: RE | Admit: 2022-09-25 | Discharge: 2022-09-25 | Disposition: A | Discharge status: Home / Self Care | Payer: Medicare Other | Source: Ambulatory Visit | Attending: Pulmonary Disease | Admitting: Pulmonary Disease

## 2022-09-25 DIAGNOSIS — Z0389 Encounter for observation for other suspected diseases and conditions ruled out: Secondary | ICD-10-CM | POA: Diagnosis not present

## 2022-09-25 DIAGNOSIS — R0609 Other forms of dyspnea: Secondary | ICD-10-CM

## 2022-09-25 DIAGNOSIS — R0602 Shortness of breath: Secondary | ICD-10-CM | POA: Diagnosis not present

## 2022-09-25 HISTORY — DX: Disorder of kidney and ureter, unspecified: N28.9

## 2022-09-25 MED ORDER — TECHNETIUM TO 99M ALBUMIN AGGREGATED
4.0000 | Freq: Once | INTRAVENOUS | Status: AC | PRN
  Administered 2022-09-25: 4.3 via INTRAVENOUS

## 2022-09-25 NOTE — Telephone Encounter (Signed)
Spoke with pt, advised her to increase the toujeo to 40 units qhs per Dr.Nida's orders. Understanding voiced.

## 2022-09-25 NOTE — Telephone Encounter (Signed)
[name] calling. PT needs a Chest Rad as well and there is no order in. Please place order.  His name is Norma [name] or [name] @ [phone]

## 2022-09-25 NOTE — Telephone Encounter (Signed)
Pt called with high BG readings.   Date Before breakfast Before lunch Before supper Bedtime  09/16/22 155   250  09/18/22 264     09/20/22  311    09/25/22 207       Pt taking: toujeo 30 units qhs, jardiance 12.5mg  (splitting a 25mg  tablet in half) and glipizide 5mg  qd. Please advise.

## 2022-09-26 NOTE — Telephone Encounter (Signed)
RA, please advise.  

## 2022-09-27 ENCOUNTER — Other Ambulatory Visit: Payer: Self-pay | Admitting: Pulmonary Disease

## 2022-09-27 DIAGNOSIS — I509 Heart failure, unspecified: Secondary | ICD-10-CM | POA: Diagnosis not present

## 2022-09-27 NOTE — Telephone Encounter (Signed)
Xray has been placed and done.   Nothing further needed.

## 2022-09-30 ENCOUNTER — Encounter: Payer: Self-pay | Admitting: Pulmonary Disease

## 2022-09-30 DIAGNOSIS — M5416 Radiculopathy, lumbar region: Secondary | ICD-10-CM | POA: Diagnosis not present

## 2022-09-30 DIAGNOSIS — E1142 Type 2 diabetes mellitus with diabetic polyneuropathy: Secondary | ICD-10-CM | POA: Diagnosis not present

## 2022-09-30 DIAGNOSIS — Z6841 Body Mass Index (BMI) 40.0 and over, adult: Secondary | ICD-10-CM | POA: Diagnosis not present

## 2022-10-01 ENCOUNTER — Other Ambulatory Visit: Payer: Self-pay

## 2022-10-01 ENCOUNTER — Telehealth: Payer: Self-pay | Admitting: Pulmonary Disease

## 2022-10-01 DIAGNOSIS — M5416 Radiculopathy, lumbar region: Secondary | ICD-10-CM | POA: Diagnosis not present

## 2022-10-01 DIAGNOSIS — Z6841 Body Mass Index (BMI) 40.0 and over, adult: Secondary | ICD-10-CM | POA: Diagnosis not present

## 2022-10-01 DIAGNOSIS — E1159 Type 2 diabetes mellitus with other circulatory complications: Secondary | ICD-10-CM

## 2022-10-01 MED ORDER — TOUJEO SOLOSTAR 300 UNIT/ML ~~LOC~~ SOPN
40.0000 [IU] | PEN_INJECTOR | Freq: Every day | SUBCUTANEOUS | 0 refills | Status: DC
Start: 2022-10-01 — End: 2022-10-25

## 2022-10-01 NOTE — Telephone Encounter (Signed)
Norma [name] states patient needs RX for Torsemide. Pharmacy is [facility]. Sarah phone number is [phone].

## 2022-10-01 NOTE — Telephone Encounter (Signed)
Are we referring to the Chest xray and VQ scan on 09/25/22, they have not been resulted yet. No results are in Epic

## 2022-10-02 IMAGING — RF DG LUMBAR SPINE 2-3V
1 series · 2 of 2 positions shown · non-contrast
Comparison: None.

CLINICAL DATA: Lumbar fusion of L4-L5

EXAM:
LUMBAR SPINE - 2 VIEW

[Series 1: run · 2 of 2 slices shown]
[im 1/2]
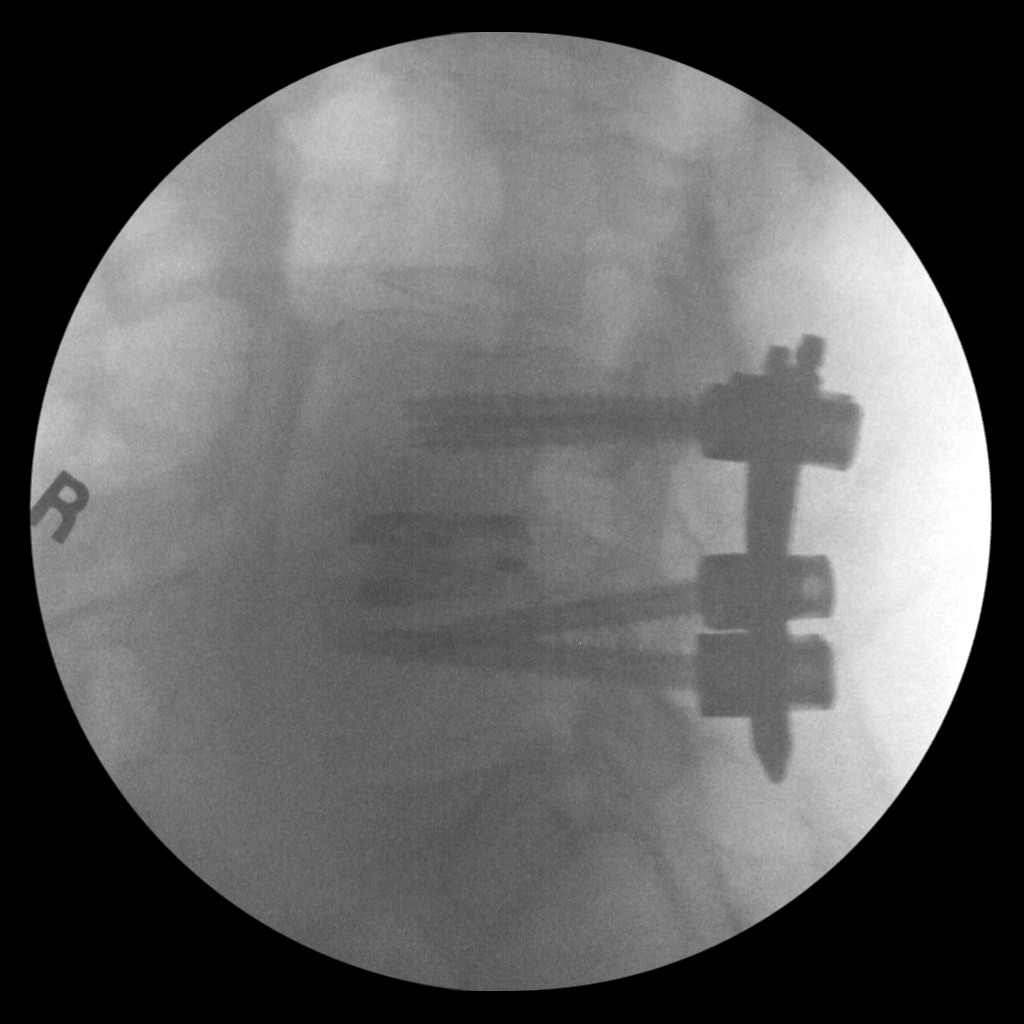
[im 2/2]
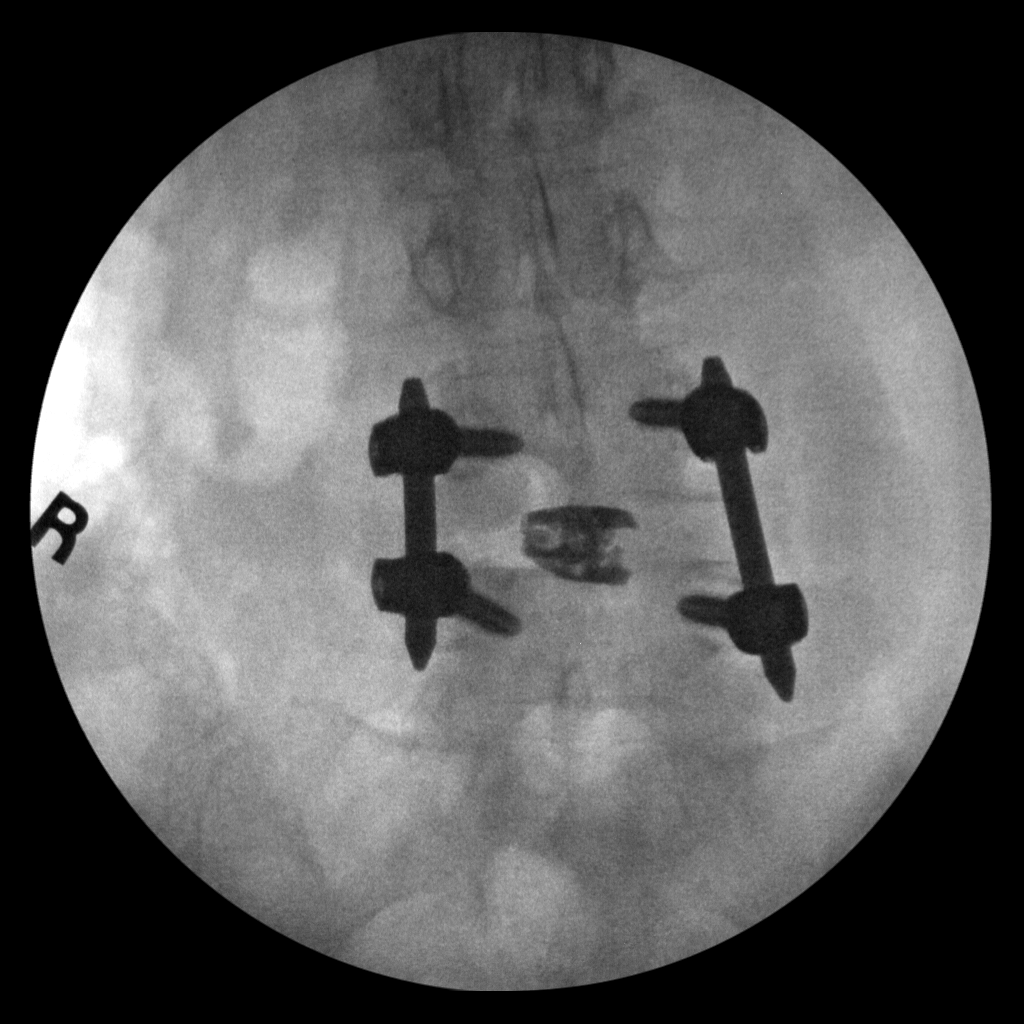

[2 of 2 positions shown; findings below may reference images not displayed]

FINDINGS: Fluoroscopic images were obtained intraoperatively and submitted for
post operative interpretation. Lumbar fusion of L4-L5 with interbody
spacer. Hardware in expected position, 2 images were obtained with
166.3 seconds of fluoroscopy time and 164.8 mGy^2. Please see the
performing provider's procedural report for further detail.
IMPRESSION: As above.

## 2022-10-03 ENCOUNTER — Ambulatory Visit: Payer: Medicare Other | Attending: Nurse Practitioner | Admitting: Nurse Practitioner

## 2022-10-03 ENCOUNTER — Encounter: Payer: Self-pay | Admitting: Nurse Practitioner

## 2022-10-03 VITALS — BP 126/72 | HR 75 | Ht 60.0 in | Wt 257.6 lb

## 2022-10-03 DIAGNOSIS — N1832 Chronic kidney disease, stage 3b: Secondary | ICD-10-CM | POA: Insufficient documentation

## 2022-10-03 DIAGNOSIS — R0609 Other forms of dyspnea: Secondary | ICD-10-CM | POA: Diagnosis not present

## 2022-10-03 DIAGNOSIS — I6523 Occlusion and stenosis of bilateral carotid arteries: Secondary | ICD-10-CM | POA: Diagnosis not present

## 2022-10-03 DIAGNOSIS — I2510 Atherosclerotic heart disease of native coronary artery without angina pectoris: Secondary | ICD-10-CM | POA: Diagnosis not present

## 2022-10-03 DIAGNOSIS — E785 Hyperlipidemia, unspecified: Secondary | ICD-10-CM | POA: Insufficient documentation

## 2022-10-03 DIAGNOSIS — G4733 Obstructive sleep apnea (adult) (pediatric): Secondary | ICD-10-CM | POA: Diagnosis not present

## 2022-10-03 DIAGNOSIS — I5032 Chronic diastolic (congestive) heart failure: Secondary | ICD-10-CM | POA: Diagnosis not present

## 2022-10-03 NOTE — Progress Notes (Signed)
Cardiology Office Note:    Date: 10/03/2022  ID:  Norma [name], DOB 02/03/1951, MRN [id number]  PCP:  [name], MD   [facility] Providers Cardiologist:  Dina Rich, MD     Referring MD: [name], MD   CC: Here for follow-up  History of Present Illness:    Norma [name] is a 72 y.o. female with a hx of the following:  CAD, s/p CABG Hyperlipidemia Carotid artery stenosis Chronic diastolic CHF Chronic left bundle branch block Type 2 diabetes CKD stage IIIb GERD OSA on CPAP COPD  Patient is a very pleasant 72 year old female with past medical history as mentioned above.  Underwent cardiac catheterization in 2019 for unstable angina.  Findings revealed D1 90%, LAD 95%, and RPDA 80%, as well as OM 90%.  Underwent CABG with sequential SVG to diag and LAD, SVG-OM.  Echocardiogram in 2022 showed normal EF.  TTE 05/2022 showed normal EF, no RWMA, normal PASP, mildly calcified AV, no significant valvular abnormalities.  Today she states she is doing well and doing better since I last saw her. She is breathing better and states she has lost weight.  Denies any chest pain, palpitations, syncope, presyncope, dizziness, orthopnea, PND, swelling or significant weight changes, acute bleeding, or claudication. Does admit to improved DOE.   Past Medical History:  Diagnosis Date   Anemia    Anxiety    Bipolar disorder (HCC)    Bulging lumbar disc    L3-4   Chronic daily headache    Chronic low back pain 09/20/2014   CKD (chronic kidney disease)    stage 3   COPD (chronic obstructive pulmonary disease) (HCC)    Coronary artery disease    CABG 2019   Degenerative arthritis    Depression    Diabetes mellitus, type II (HCC)    Diabetic neuropathy (HCC) 09/16/2018   DM type 2 with diabetic peripheral neuropathy (HCC) 09/20/2014   Dyslipidemia    Dyspnea    Family history of adverse reaction to anesthesia    father had reaction to anesthesia medication  per patient "they don't use it anymore"- unsure of reaction   Fatty liver    per pt report   Gastroparesis    GERD (gastroesophageal reflux disease)    Heart murmur    History of hiatal hernia    pt states she no longer has this   Hypertension    Hypothyroidism    IBS (irritable bowel syndrome)    LBBB (left bundle branch block)    Memory difficulty 09/20/2014   Morbid obesity (HCC)    Myocardial infarction (HCC)    per pt- prior to CABG   Neuropathy    Obstructive sleep apnea on CPAP    Renal insufficiency    Restless legs syndrome (RLS) 09/17/2012   Stroke (cerebrum) (HCC) 05/16/2017   Left parietal   Urticaria     Past Surgical History:  Procedure Laterality Date   ABDOMINAL HYSTERECTOMY     ARTHROSCOPY KNEE W/ DRILLING  06/2011   and Decemer of 2013.   Carpel tunnel  1980's   CATARACT EXTRACTION W/PHACO Right 03/21/2017   Procedure: CATARACT EXTRACTION PHACO AND INTRAOCULAR LENS PLACEMENT RIGHT EYE;  Surgeon: Fabio Pierce, MD;  Location: [facility];  Service: Ophthalmology;  Laterality: Right;  CDE: 2.91    CATARACT EXTRACTION W/PHACO Left 04/18/2017   Procedure: CATARACT EXTRACTION PHACO AND INTRAOCULAR LENS PLACEMENT LEFT EYE;  Surgeon: Fabio Pierce, MD;  Location: [facility];  Service: Ophthalmology;  Laterality: Left;  left   CHOLECYSTECTOMY     COLONOSCOPY WITH PROPOFOL N/A 10/04/2016   Procedure: COLONOSCOPY WITH PROPOFOL;  Surgeon: Malissa Hippo, MD;  Location: [facility];  Service: Endoscopy;  Laterality: N/A;  11:10   CORONARY ARTERY BYPASS GRAFT N/A 08/29/2017   Procedure: CORONARY ARTERY BYPASS GRAFTING (CABG) x 3 WITH ENDOSCOPIC HARVESTING OF RIGHT SAPHENOUS VEIN;  Surgeon: Kerin Perna, MD;  Location: [facility] OR;  Service: Open Heart Surgery;  Laterality: N/A;   ESOPHAGOGASTRODUODENOSCOPY N/A 04/25/2016   Procedure: ESOPHAGOGASTRODUODENOSCOPY (EGD);  Surgeon: Malissa Hippo, MD;  Location: [facility];  Service: Endoscopy;  Laterality: N/A;  730   LEFT  HEART CATH AND CORONARY ANGIOGRAPHY N/A 08/27/2017   Procedure: LEFT HEART CATH AND CORONARY ANGIOGRAPHY;  Surgeon: Tonny Bollman, MD;  Location: [facility] INVASIVE CV LAB;  Service: Cardiovascular::  pLAD 95% - p-mLAD 50%, ostD1 90% -pD1 80%; ostOM1 90%; rPDA 80%. EF ~50-55% - HK of dital Anterolateral & Apical wall.  - Rec CVTS c/s   NECK SURGERY     pt reports having growth removed from the back of her neck- after 2021   OPEN REDUCTION INTERNAL FIXATION (ORIF) HAND Left 04/10/2020   Procedure: OPEN REDUCTION INTERNAL FIXATION (ORIF) HAND, left index finger;  Surgeon: Vickki Hearing, MD;  Location: [facility];  Service: Orthopedics;  Laterality: Left;  0.045 k wires   RECTAL SURGERY     fissure   SHOULDER SURGERY Left    arthroscopy in March of this year   TEE WITHOUT CARDIOVERSION N/A 08/29/2017   Procedure: TRANSESOPHAGEAL ECHOCARDIOGRAM (TEE);  Surgeon: Donata Clay, Theron Arista, MD;  Location: [facility] OR;  Service: Open Heart Surgery;  Laterality: N/A;   TRANSFORAMINAL LUMBAR INTERBODY FUSION W/ MIS 1 LEVEL Right 07/12/2021   Procedure: Minimally Invasive Surgery Transforaminal Lumbar Interbody Fusion  Lumbar four-five;  Surgeon: Bedelia Person, MD;  Location: [facility];  Service: Neurosurgery;  Laterality: Right;   TRANSTHORACIC ECHOCARDIOGRAM  06/06/2017   Mild to moderate reduced EF 40 and 45%.  Anterior septal, inferoseptal and basal to mid inferior hypokinesis.  GR 1 DD.  No significant valvular lesion    Current Medications: Current Meds  Medication Sig   Acetaminophen (TYLENOL ARTHRITIS EXT RELIEF PO)    albuterol (VENTOLIN HFA) 108 (90 Base) MCG/ACT inhaler Inhale into the lungs every 6 (six) hours as needed for wheezing or shortness of breath.   ALPRAZolam (XANAX) 0.25 MG tablet Take 1 tablet (0.25 mg total) by mouth at bedtime as needed for anxiety or sleep.   ARIPiprazole (ABILIFY) 5 MG tablet Take 1 tablet (5 mg total) by mouth daily.   aspirin EC 81 MG tablet Take 1 tablet (81 mg total) by  mouth daily.   azelastine (ASTELIN) 0.1 % nasal spray Place into both nostrils 2 (two) times daily. Use in each nostril as directed   buPROPion (WELLBUTRIN XL) 150 MG 24 hr tablet Take 1 tablet (150 mg total) by mouth every morning.   carvedilol (COREG) 6.25 MG tablet Take 1 tablet (6.25 mg total) by mouth 2 (two) times daily with a meal.   diclofenac Sodium (VOLTAREN) 1 % GEL Apply 2 g topically 4 (four) times daily as needed (pain).   diphenhydrAMINE (BENADRYL) 25 mg capsule Take 25 mg by mouth as needed for allergies or itching.   empagliflozin (JARDIANCE) 10 MG TABS tablet Take 1 tablet (10 mg total) by mouth daily.   EPINEPHrine 0.3 mg/0.3 mL IJ SOAJ injection  Inject 0.3 mg into the muscle once.   ferrous sulfate 325 (65 FE) MG EC tablet Take 325 mg by mouth. One daily   gabapentin (NEURONTIN) 400 MG capsule Take 400 mg by mouth in the morning. & 800 mg at bedtime   glipiZIDE (GLUCOTROL XL) 5 MG 24 hr tablet Take 1 tablet (5 mg total) by mouth daily with breakfast.   insulin glargine, 1 Unit Dial, (TOUJEO SOLOSTAR) 300 UNIT/ML Solostar Pen Inject 40 Units into the skin at bedtime.   lamoTRIgine (LAMICTAL) 150 MG tablet Take 1 tablet (150 mg total) by mouth at bedtime.   levothyroxine (SYNTHROID) 112 MCG tablet Take 112 mcg by mouth daily before breakfast.   losartan (COZAAR) 50 MG tablet Take 1 tablet (50 mg total) by mouth every morning.   meclizine (ANTIVERT) 25 MG tablet Take 1 tablet (25 mg total) by mouth 3 (three) times daily as needed for dizziness. (Patient taking differently: Take 25 mg by mouth daily as needed for dizziness.)   mupirocin ointment (BACTROBAN) 2 %    ondansetron (ZOFRAN) 4 MG tablet Take 1 tablet (4 mg total) by mouth every 8 (eight) hours as needed for nausea or vomiting.   OVER THE COUNTER MEDICATION daily as needed (pain). CBD ointment/roll on   pantoprazole (PROTONIX) 40 MG tablet Take 1 tablet by mouth daily before supper.   REPATHA SURECLICK 140 MG/ML SOAJ  INJECT ONE DOSE INTO SKIN EVERY 14 DAYS.   rOPINIRole (REQUIP) 3 MG tablet TAKE ONE TABLET BY MOUTH EVERY MORNING, ,NOON AND AT BEDTIME. (PRT PT MORNING,EVENING,BEDTIME) (Patient taking differently: Take 3 mg by mouth 3 (three) times daily.)   tiZANidine (ZANAFLEX) 2 MG tablet Take 2 mg by mouth 2 (two) times daily.   venlafaxine XR (EFFEXOR-XR) 150 MG 24 hr capsule TAKE TWO (2) CAPSULES BY MOUTH AT BEDTIME   Vitamin D, Ergocalciferol, (DRISDOL) 1.25 MG (50000 UNIT) CAPS capsule Take 50,000 Units by mouth every 7 (seven) days. Sunday   torsemide (DEMADEX) 10 MG tablet Take 2 tablets (20 mg total) by mouth daily.     Allergies:   Amoxicillin, Hydrocodone, Percocet [oxycodone-acetaminophen], Acetaminophen, Depacon [valproate sodium], Depacon [valproic acid], Oxycodone, Oxycodone hcl, and Prednisone   Social History   Socioeconomic History   Marital status: Single    Spouse name: Not on file   Number of children: 0   Years of education: 16   Highest education level: Not on file  Occupational History    Employer: [employer]  Tobacco Use   Smoking status: Former    Types: Cigarettes    Quit date: 02/04/1971    Years since quitting: 51.6   Smokeless tobacco: Never   Tobacco comments:    smoked 2 cigarettes a day  Vaping Use   Vaping Use: Never used  Substance and Sexual Activity   Alcohol use: No    Alcohol/week: 0.0 standard drinks of alcohol   Drug use: No   Sexual activity: Never  Other Topics Concern   Not on file  Social History Narrative   Patient lives at home alone.    Patient has no children.    Patient has her masters in nursing.    Patient is single.    Patient drinks about 2 glasses of tea daily.   Patient is right handed.   Social Determinants of Health   Financial Resource Strain: Medium Risk (12/18/2021)   Overall Financial Resource Strain (CARDIA)    Difficulty of Paying Living Expenses: Somewhat hard  Food  Insecurity: No Food Insecurity (12/18/2021)    Hunger Vital Sign    Worried About Running Out of Food in the Last Year: Never true    Ran Out of Food in the Last Year: Never true  Transportation Needs: No Transportation Needs (12/18/2021)   PRAPARE - Administrator, Civil Service (Medical): No    Lack of Transportation (Non-Medical): No  Physical Activity: Not on file  Stress: No Stress Concern Present (10/04/2021)   [employer] of Occupational Health - Occupational Stress Questionnaire    Feeling of Stress : Only a little  Social Connections: Not on file     Family History: The patient's family history includes Alcohol abuse in her brother, father, paternal grandfather, paternal grandmother, and paternal uncle; Arthritis in her father; Cancer in her brother; Depression in her mother; Hypertension in her mother and sister; Lymphoma in her mother. There is no history of Allergic rhinitis, Angioedema, Asthma, Atopy, Eczema, Immunodeficiency, or Urticaria.  ROS:     Please see the history of present illness.    All other systems reviewed and are negative.  EKGs/Labs/Other Studies Reviewed:    The following studies were reviewed today:   EKG:  EKG is not ordered today.   Echo on 06/11/2022:   1. Left ventricular ejection fraction, by estimation, is 55 to 60%. The  left ventricle has normal function. The left ventricle has no regional  wall motion abnormalities. Left ventricular diastolic parameters were  normal. The average left ventricular  global longitudinal strain is -23.2 %. The global longitudinal strain is  normal.   2. Right ventricular systolic function is normal. The right ventricular  size is normal. There is normal pulmonary artery systolic pressure. The  estimated right ventricular systolic pressure is 28.4 mmHg.   3. The mitral valve is grossly normal. Trivial mitral valve  regurgitation.   4. The aortic valve is tricuspid. There is mild calcification of the  aortic valve. Aortic valve  regurgitation is not visualized. Aortic valve  sclerosis/calcification is present, without any evidence of aortic  stenosis.   5. The inferior vena cava is normal in size with greater than 50%  respiratory variability, suggesting right atrial pressure of 3 mmHg.   Comparison(s): Prior images reviewed side by side. LVEF normal range at  55-60%, septal motion consistent with LBBB.   Echocardiogram on 10/18/2020:   1. Markedly abnormal septal motion ? from BBB. Left ventricular ejection  fraction, by estimation, is 50 to 55%. The left ventricle has low normal  function. The left ventricle has no regional wall motion abnormalities.  Left ventricular diastolic  parameters were normal.   2. Right ventricular systolic function is normal. The right ventricular  size is normal.   3. The mitral valve is normal in structure. Trivial mitral valve  regurgitation. No evidence of mitral stenosis.   4. The aortic valve is tricuspid. There is mild calcification of the  aortic valve. Aortic valve regurgitation is not visualized. Mild aortic  valve sclerosis is present, with no evidence of aortic valve stenosis.   5. The inferior vena cava is normal in size with greater than 50%  respiratory variability, suggesting right atrial pressure of 3 mmHg.   Comparison(s): Echocardiogram done 02/03/18 showed an EF of 50-55%.  TEE on 08/30/2017: Aortic valve: The valve is trileaflet. No stenosis. No regurgitation.   Mitral valve: Mild regurgitation. Central jet.   Right ventricle: Normal cavity size, wall thickness and ejection  fraction.   Pericardium: Trivial  pericardial effusion.   Tricuspid valve: No regurgitation   Pulmonic valve: No regurgitation by color doppler.   Left ventricle: Regional wall motion abnormalities present, dyskinetic  basal and apical septal wall, hypokinesis of lateral wall and inferoseptal  wall. Increased wall thickness. LVEF 35-40%.   No ASD/PFO present   Unable to rule  out thrombus in LAA but velocities make thrombus less  likely.    Post Bypass:   Tricuspid, Pulmonic, Mitral and Aortic valve unchanged. LVEF > 40% with  vasopressor support (CO > 3.2). RWMA's present but slightly improved. No  dissection present after aortic cannula removed.   Left heart cath on 08/27/2017: RPDA lesion is 80% stenosed. Ost 1st Mrg to 1st Mrg lesion is 90% stenosed. Prox LAD lesion is 95% stenosed. Prox LAD to Mid LAD lesion is 50% stenosed. Ost 1st Diag lesion is 90% stenosed. 1st Diag lesion is 80% stenosed. The left ventricular ejection fraction is 50-55% by visual estimate. There is mild left ventricular systolic dysfunction.   1.  Severe multivessel coronary artery disease with severe stenosis of the proximal LAD/first diagonal bifurcation, first obtuse marginal branch of the circumflex, and PDA branch of the RCA. 2.  Mild segmental LV systolic dysfunction with hypokinesis of the distal anterolateral and apical walls, LVEF estimated at 50 to 55%.   Recommendations: The patient has critical multivessel coronary artery disease with symptoms of unstable angina.  Considering her history of long-standing diabetes and multivessel disease involving the LAD/diagonal bifurcation, will consult cardiac surgery for consideration of CABG.  If she is felt to be a poor candidate for CABG because of morbid obesity and other comorbid medical problems, PCI would be a reasonable alternative.  If she requires treatment with PCI, I would treat the LAD/diagonal bifurcation and the severe stenosis in the large first OM branch.  Would likely treat her PDA lesion medically considering its relatively distal location in that vessel.  Myoview on 04/12/2016: Probable normal perfusion and mild soft tissue attenuation (breast) No significant ischemia or evidence for scar The left ventricular ejection fraction is normal (55-65%). Low risk scan.  Recent Labs: 09/10/2022: BNP 26.9; BUN 25;  Creatinine, Ser 1.64; Potassium 4.5; Sodium 138  Recent Lipid Panel    Component Value Date/Time   CHOL 129 11/08/2019 0000   TRIG 158 11/08/2019 0000   HDL 60 11/08/2019 0000   CHOLHDL 3.8 03/05/2019 1221   VLDL 31 (H) 07/10/2016 0934   LDLCALC 43 11/08/2019 0000   LDLCALC 152 (H) 03/05/2019 1221    Physical Exam:    VS:  BP 126/72   Pulse 75   Ht 5' (1.524 m)   Wt 257 lb 9.6 oz (116.8 kg)   SpO2 92%   BMI 50.31 kg/m     Wt Readings from Last 3 Encounters:  10/03/22 257 lb 9.6 oz (116.8 kg)  09/19/22 261 lb (118.4 kg)  09/10/22 265 lb (120.2 kg)     GEN: Morbidly obese, 72 y.o. female in no acute distress HEENT: Normal NECK: No JVD; R carotid bruit, no L carotid bruit CARDIAC: S1/S2, RRR, no murmurs, rubs, gallops; 2+ pulses RESPIRATORY:  Clear to auscultation without rales, wheezing or rhonchi  MUSCULOSKELETAL:  No edema; No deformity  SKIN: Warm and dry NEUROLOGIC:  Alert and oriented x 3 PSYCHIATRIC:  Normal affect   ASSESSMENT:    No diagnosis found.  PLAN:    In order of problems listed above:  Chronic diastolic CHF, DOE Stage C, NYHA class I-II symptoms. Breathing  stable/improved. Echo 06/2022 showed normal EF. Euvolemic and well compensated on exam. Continue current medication regimen, unable to increase GDMT with current BP. Low sodium diet, fluid restriction <2L, and daily weights encouraged. Educated to contact our office for weight gain of 2 lbs overnight or 5 lbs in one week. Heart healthy diet and regular cardiovascular exercise encouraged.   CAD, s/p CABG Stable with no anginal symptoms. No indication for ischemic evaluation.   She is s/p CABG in 2019. Continue ASA, carvedilol, losartan, and repatha. Heart healthy diet and regular cardiovascular exercise encouraged. Previously recommended Tylenol 1,000 mg BID for pain relief. ED precautions discussed.   Carotid artery stenosis, HLD 1-39% bilateral stenosis seen on carotid duplex 07/2022. Previous  lipid panel revealed LDL at 46. She is at goal. Continue current medication regimen. Heart healthy diet and regular cardiovascular exercise encouraged.   CKD stage 3b Stable kidney function 09/2022. Avoid nephrotoxic agents. She follows Dr. Wolfgang Phoenix (Nephrology). Continue to follow with PCP.  5. OSA on CPAP Encouraged continued compliance with CPAP. Encouraged her to continue to follow-up with Dr. [name].  6. Morbid obesity BMI today 50.31. Weight loss via diet and exercise encouraged. Discussed the impact being overweight would have on cardiovascular risk.   8. Disposition: Follow-up with me or [facility] in 4-6 months or sooner if anything changes.    Medication Adjustments/Labs and Tests Ordered: Current medicines are reviewed at length with the patient today.  Concerns regarding medicines are outlined above.  No orders of the defined types were placed in this encounter.  No orders of the defined types were placed in this encounter.   Patient Instructions  Medication Instructions:  Your physician recommends that you continue on your current medications as directed. Please refer to the Current Medication list given to you today.  Labwork: none  Testing/Procedures: noe  Follow-Up: Your physician recommends that you schedule a follow-up appointment in: Follow up with Philis Nettle in 4-6 months  Any Other Special Instructions Will Be Listed Below (If Applicable).  If you need a refill on your cardiac medications before your next appointment, please call your pharmacy.    Signed, Sharlene Dory, NP  10/04/2022 4:51 PM    [facility]

## 2022-10-03 NOTE — Telephone Encounter (Signed)
Okay to send refill for torsemide.

## 2022-10-03 NOTE — Patient Instructions (Addendum)
Medication Instructions:  Your physician recommends that you continue on your current medications as directed. Please refer to the Current Medication list given to you today.  Labwork: none  Testing/Procedures: noe  Follow-Up: Your physician recommends that you schedule a follow-up appointment in: Follow up with Norma [name] in 4-6 months  Any Other Special Instructions Will Be Listed Below (If Applicable).  If you need a refill on your cardiac medications before your next appointment, please call your pharmacy.

## 2022-10-03 NOTE — Telephone Encounter (Signed)
Pt. Calling about blood work she had last week and Dr.Alva wants more done tomorrow can he hold the blood test for right now please advise pt

## 2022-10-03 NOTE — Telephone Encounter (Signed)
Called and spoke w/ pt she verbalized that she is currently weighing 257lb and Dr.Alva wanted her to continue the torsemide until she was @ 255lb.   Please advise if you are okay w/ me sending in another refill for pt?

## 2022-10-04 MED ORDER — TORSEMIDE 10 MG PO TABS: 20.0000 mg | ORAL_TABLET | Freq: Every day | ORAL | 0 refills | Status: DC

## 2022-10-04 NOTE — Telephone Encounter (Signed)
ATC pt LVM letting her know that I refilled the torsemide. Also routing message to RA for review of repeat blood work.

## 2022-10-08 ENCOUNTER — Ambulatory Visit (INDEPENDENT_AMBULATORY_CARE_PROVIDER_SITE_OTHER): Payer: Medicare Other | Admitting: Gastroenterology

## 2022-10-08 DIAGNOSIS — L97521 Non-pressure chronic ulcer of other part of left foot limited to breakdown of skin: Secondary | ICD-10-CM | POA: Diagnosis not present

## 2022-10-09 DIAGNOSIS — E1121 Type 2 diabetes mellitus with diabetic nephropathy: Secondary | ICD-10-CM | POA: Diagnosis not present

## 2022-10-09 DIAGNOSIS — I2510 Atherosclerotic heart disease of native coronary artery without angina pectoris: Secondary | ICD-10-CM | POA: Diagnosis not present

## 2022-10-09 DIAGNOSIS — E039 Hypothyroidism, unspecified: Secondary | ICD-10-CM | POA: Diagnosis not present

## 2022-10-09 LAB — HEPATIC FUNCTION PANEL
ALT: 13 U/L (ref 7–35)
AST: 13 (ref 13–35)
Alkaline Phosphatase: 82 (ref 25–125)
Bilirubin, Total: 0.4

## 2022-10-09 LAB — COMPREHENSIVE METABOLIC PANEL
Albumin: 3.9 (ref 3.5–5.0)
Calcium: 9.4 (ref 8.7–10.7)
Globulin: 2.9

## 2022-10-09 LAB — BASIC METABOLIC PANEL
BUN: 38 — AB (ref 4–21)
CO2: 24 — AB (ref 13–22)
Chloride: 95 — AB (ref 99–108)
Creatinine: 2.3 — AB (ref 0.5–1.1)
Glucose: 176
Potassium: 4.3 mEq/L (ref 3.5–5.1)
Sodium: 136 — AB (ref 137–147)

## 2022-10-09 LAB — LIPID PANEL
Cholesterol: 139 (ref 0–200)
HDL: 55 (ref 35–70)
LDL Cholesterol: 62
Triglycerides: 123 (ref 40–160)

## 2022-10-09 LAB — TSH: TSH: 1.73 (ref 0.41–5.90)

## 2022-10-09 LAB — HEMOGLOBIN A1C: Hemoglobin A1C: 9.6

## 2022-10-15 DIAGNOSIS — I5042 Chronic combined systolic (congestive) and diastolic (congestive) heart failure: Secondary | ICD-10-CM | POA: Diagnosis not present

## 2022-10-15 DIAGNOSIS — R809 Proteinuria, unspecified: Secondary | ICD-10-CM | POA: Diagnosis not present

## 2022-10-15 DIAGNOSIS — N170 Acute kidney failure with tubular necrosis: Secondary | ICD-10-CM | POA: Diagnosis not present

## 2022-10-15 DIAGNOSIS — E1122 Type 2 diabetes mellitus with diabetic chronic kidney disease: Secondary | ICD-10-CM | POA: Diagnosis not present

## 2022-10-18 ENCOUNTER — Other Ambulatory Visit (HOSPITAL_COMMUNITY): Payer: Self-pay | Admitting: Psychiatry

## 2022-10-18 ENCOUNTER — Telehealth (HOSPITAL_COMMUNITY): Payer: Self-pay | Admitting: *Deleted

## 2022-10-18 MED ORDER — LAMOTRIGINE 150 MG PO TABS: 150.0000 mg | ORAL_TABLET | Freq: Every day | ORAL | 2 refills | Status: DC

## 2022-10-18 NOTE — Telephone Encounter (Signed)
sent 

## 2022-10-18 NOTE — Telephone Encounter (Signed)
Patient called stating her pharmacy is [facility] and [facility] is closed. Per pt she is needing refills for her Lamictal and she is out of medication.   Patient would like provider to please send in her Lamictal to [facility].

## 2022-10-21 ENCOUNTER — Other Ambulatory Visit (HOSPITAL_COMMUNITY): Payer: Self-pay | Admitting: Psychiatry

## 2022-10-21 MED ORDER — LAMOTRIGINE 150 MG PO TABS: 150.0000 mg | ORAL_TABLET | Freq: Every day | ORAL | 2 refills | Status: DC

## 2022-10-23 DIAGNOSIS — N1832 Chronic kidney disease, stage 3b: Secondary | ICD-10-CM | POA: Diagnosis not present

## 2022-10-23 DIAGNOSIS — G4733 Obstructive sleep apnea (adult) (pediatric): Secondary | ICD-10-CM | POA: Diagnosis not present

## 2022-10-23 DIAGNOSIS — I2510 Atherosclerotic heart disease of native coronary artery without angina pectoris: Secondary | ICD-10-CM | POA: Diagnosis not present

## 2022-10-23 DIAGNOSIS — E1142 Type 2 diabetes mellitus with diabetic polyneuropathy: Secondary | ICD-10-CM | POA: Diagnosis not present

## 2022-10-23 DIAGNOSIS — E1122 Type 2 diabetes mellitus with diabetic chronic kidney disease: Secondary | ICD-10-CM | POA: Diagnosis not present

## 2022-10-23 DIAGNOSIS — E039 Hypothyroidism, unspecified: Secondary | ICD-10-CM | POA: Diagnosis not present

## 2022-10-23 DIAGNOSIS — G8929 Other chronic pain: Secondary | ICD-10-CM | POA: Diagnosis not present

## 2022-10-23 DIAGNOSIS — D509 Iron deficiency anemia, unspecified: Secondary | ICD-10-CM | POA: Diagnosis not present

## 2022-10-23 DIAGNOSIS — N1831 Chronic kidney disease, stage 3a: Secondary | ICD-10-CM | POA: Diagnosis not present

## 2022-10-23 DIAGNOSIS — M545 Low back pain, unspecified: Secondary | ICD-10-CM | POA: Diagnosis not present

## 2022-10-23 DIAGNOSIS — I5032 Chronic diastolic (congestive) heart failure: Secondary | ICD-10-CM | POA: Diagnosis not present

## 2022-10-25 ENCOUNTER — Encounter: Payer: Self-pay | Admitting: "Endocrinology

## 2022-10-25 ENCOUNTER — Ambulatory Visit (INDEPENDENT_AMBULATORY_CARE_PROVIDER_SITE_OTHER): Payer: Medicare Other | Admitting: "Endocrinology

## 2022-10-25 VITALS — BP 130/60 | HR 80 | Ht 60.0 in | Wt 260.8 lb

## 2022-10-25 DIAGNOSIS — E1169 Type 2 diabetes mellitus with other specified complication: Secondary | ICD-10-CM | POA: Diagnosis not present

## 2022-10-25 DIAGNOSIS — I10 Essential (primary) hypertension: Secondary | ICD-10-CM | POA: Diagnosis not present

## 2022-10-25 DIAGNOSIS — E038 Other specified hypothyroidism: Secondary | ICD-10-CM | POA: Diagnosis not present

## 2022-10-25 DIAGNOSIS — E1159 Type 2 diabetes mellitus with other circulatory complications: Secondary | ICD-10-CM

## 2022-10-25 DIAGNOSIS — E785 Hyperlipidemia, unspecified: Secondary | ICD-10-CM

## 2022-10-25 MED ORDER — TOUJEO SOLOSTAR 300 UNIT/ML ~~LOC~~ SOPN
44.0000 [IU] | PEN_INJECTOR | Freq: Every day | SUBCUTANEOUS | 1 refills | Status: DC
Start: 2022-10-25 — End: 2023-02-04

## 2022-10-25 NOTE — Progress Notes (Signed)
10/25/2022, 3:51 PM   Endocrinology follow-up note  Subjective:    Patient ID: Norma [name], female    DOB: [birth year].  Sherry Ruffing is being seen in follow-up after she was seen in consultation for management of currently uncontrolled symptomatic diabetes requested by  [name], MD.   Past Medical History:  Diagnosis Date   Anemia    Anxiety    Bipolar disorder (HCC)    Bulging lumbar disc    L3-4   Chronic daily headache    Chronic low back pain 09/20/2014   CKD (chronic kidney disease)    stage 3   COPD (chronic obstructive pulmonary disease) (HCC)    Coronary artery disease    CABG 2019   Degenerative arthritis    Depression    Diabetes mellitus, type II (HCC)    Diabetic neuropathy (HCC) 09/16/2018   DM type 2 with diabetic peripheral neuropathy (HCC) 09/20/2014   Dyslipidemia    Dyspnea    Family history of adverse reaction to anesthesia    father had reaction to anesthesia medication per patient "they don't use it anymore"- unsure of reaction   Fatty liver    per pt report   Gastroparesis    GERD (gastroesophageal reflux disease)    Heart murmur    History of hiatal hernia    pt states she no longer has this   Hypertension    Hypothyroidism    IBS (irritable bowel syndrome)    LBBB (left bundle branch block)    Memory difficulty 09/20/2014   Morbid obesity (HCC)    Myocardial infarction (HCC)    per pt- prior to CABG   Neuropathy    Obstructive sleep apnea on CPAP    Renal insufficiency    Restless legs syndrome (RLS) 09/17/2012   Stroke (cerebrum) (HCC) 05/16/2017   Left parietal   Urticaria     Past Surgical History:  Procedure Laterality Date   ABDOMINAL HYSTERECTOMY     ARTHROSCOPY KNEE W/ DRILLING  06/2011   and Decemer of 2013.   Carpel tunnel  1980's   CATARACT EXTRACTION W/PHACO Right 03/21/2017   Procedure: CATARACT EXTRACTION PHACO AND  INTRAOCULAR LENS PLACEMENT RIGHT EYE;  Surgeon: Fabio Pierce, MD;  Location: [facility];  Service: Ophthalmology;  Laterality: Right;  CDE: 2.91    CATARACT EXTRACTION W/PHACO Left 04/18/2017   Procedure: CATARACT EXTRACTION PHACO AND INTRAOCULAR LENS PLACEMENT LEFT EYE;  Surgeon: Fabio Pierce, MD;  Location: [facility];  Service: Ophthalmology;  Laterality: Left;  left   CHOLECYSTECTOMY     COLONOSCOPY WITH PROPOFOL N/A 10/04/2016   Procedure: COLONOSCOPY WITH PROPOFOL;  Surgeon: Malissa Hippo, MD;  Location: [facility];  Service: Endoscopy;  Laterality: N/A;  11:10   CORONARY ARTERY BYPASS GRAFT N/A 08/29/2017   Procedure: CORONARY ARTERY BYPASS GRAFTING (CABG) x 3 WITH ENDOSCOPIC HARVESTING OF RIGHT SAPHENOUS VEIN;  Surgeon: Kerin Perna, MD;  Location: [facility] OR;  Service: Open Heart Surgery;  Laterality: N/A;   ESOPHAGOGASTRODUODENOSCOPY N/A 04/25/2016   Procedure: ESOPHAGOGASTRODUODENOSCOPY (EGD);  Surgeon: Malissa Hippo, MD;  Location:  [facility];  Service: Endoscopy;  Laterality: N/A;  730   LEFT HEART CATH AND CORONARY ANGIOGRAPHY N/A 08/27/2017   Procedure: LEFT HEART CATH AND CORONARY ANGIOGRAPHY;  Surgeon: Tonny Bollman, MD;  Location: [facility] INVASIVE CV LAB;  Service: Cardiovascular::  pLAD 95% - p-mLAD 50%, ostD1 90% -pD1 80%; ostOM1 90%; rPDA 80%. EF ~50-55% - HK of dital Anterolateral & Apical wall.  - Rec CVTS c/s   NECK SURGERY     pt reports having growth removed from the back of her neck- after 2021   OPEN REDUCTION INTERNAL FIXATION (ORIF) HAND Left 04/10/2020   Procedure: OPEN REDUCTION INTERNAL FIXATION (ORIF) HAND, left index finger;  Surgeon: Vickki Hearing, MD;  Location: [facility];  Service: Orthopedics;  Laterality: Left;  0.045 k wires   RECTAL SURGERY     fissure   SHOULDER SURGERY Left    arthroscopy in March of this year   TEE WITHOUT CARDIOVERSION N/A 08/29/2017   Procedure: TRANSESOPHAGEAL ECHOCARDIOGRAM (TEE);  Surgeon: Donata Clay, Theron Arista, MD;  Location:  [facility] OR;  Service: Open Heart Surgery;  Laterality: N/A;   TRANSFORAMINAL LUMBAR INTERBODY FUSION W/ MIS 1 LEVEL Right 07/12/2021   Procedure: Minimally Invasive Surgery Transforaminal Lumbar Interbody Fusion  Lumbar four-five;  Surgeon: Bedelia Person, MD;  Location: [facility] OR;  Service: Neurosurgery;  Laterality: Right;   TRANSTHORACIC ECHOCARDIOGRAM  06/06/2017   Mild to moderate reduced EF 40 and 45%.  Anterior septal, inferoseptal and basal to mid inferior hypokinesis.  GR 1 DD.  No significant valvular lesion    Social History   Socioeconomic History   Marital status: Single    Spouse name: Not on file   Number of children: 0   Years of education: 16   Highest education level: Not on file  Occupational History    Employer: [employer]  Tobacco Use   Smoking status: Former    Current packs/day: 0.00    Types: Cigarettes    Quit date: 02/04/1971    Years since quitting: 51.7   Smokeless tobacco: Never   Tobacco comments:    smoked 2 cigarettes a day  Vaping Use   Vaping status: Never Used  Substance and Sexual Activity   Alcohol use: No    Alcohol/week: 0.0 standard drinks of alcohol   Drug use: No   Sexual activity: Never  Other Topics Concern   Not on file  Social History Narrative   Patient lives at home alone.    Patient has no children.    Patient has her masters in nursing.    Patient is single.    Patient drinks about 2 glasses of tea daily.   Patient is right handed.   Social Determinants of Health   Financial Resource Strain: Medium Risk (12/18/2021)   Overall Financial Resource Strain (CARDIA)    Difficulty of Paying Living Expenses: Somewhat hard  Food Insecurity: No Food Insecurity (12/18/2021)   Hunger Vital Sign    Worried About Running Out of Food in the Last Year: Never true    Ran Out of Food in the Last Year: Never true  Transportation Needs: No Transportation Needs (12/18/2021)   PRAPARE - Administrator, Civil Service  (Medical): No    Lack of Transportation (Non-Medical): No  Physical Activity: Not on file  Stress: No Stress Concern Present (10/04/2021)   [employer] of Occupational Health - Occupational Stress Questionnaire    Feeling of Stress : Only a little  Social Connections: Not on file    Family History  Problem Relation Age of Onset   Hypertension Mother    Lymphoma Mother    Depression Mother    Arthritis Father    Alcohol abuse Father    Hypertension Sister    Cancer Brother        kidney and lung   Alcohol abuse Brother    Alcohol abuse Paternal Uncle    Alcohol abuse Paternal Grandfather    Alcohol abuse Paternal Grandmother    Allergic rhinitis Neg Hx    Angioedema Neg Hx    Asthma Neg Hx    Atopy Neg Hx    Eczema Neg Hx    Immunodeficiency Neg Hx    Urticaria Neg Hx     Outpatient Encounter Medications as of 10/25/2022  Medication Sig   Acetaminophen (TYLENOL ARTHRITIS EXT RELIEF PO)    albuterol (VENTOLIN HFA) 108 (90 Base) MCG/ACT inhaler Inhale into the lungs every 6 (six) hours as needed for wheezing or shortness of breath.   ALPRAZolam (XANAX) 0.25 MG tablet Take 1 tablet (0.25 mg total) by mouth at bedtime as needed for anxiety or sleep.   ARIPiprazole (ABILIFY) 5 MG tablet Take 1 tablet (5 mg total) by mouth daily.   aspirin EC 81 MG tablet Take 1 tablet (81 mg total) by mouth daily.   azelastine (ASTELIN) 0.1 % nasal spray Place into both nostrils 2 (two) times daily. Use in each nostril as directed   buPROPion (WELLBUTRIN XL) 150 MG 24 hr tablet Take 1 tablet (150 mg total) by mouth every morning.   carvedilol (COREG) 6.25 MG tablet Take 1 tablet (6.25 mg total) by mouth 2 (two) times daily with a meal.   diclofenac Sodium (VOLTAREN) 1 % GEL Apply 2 g topically 4 (four) times daily as needed (pain).   diphenhydrAMINE (BENADRYL) 25 mg capsule Take 25 mg by mouth as needed for allergies or itching.   empagliflozin (JARDIANCE) 10 MG TABS tablet Take 1 tablet (10  mg total) by mouth daily. (Patient taking differently: Take 5 mg by mouth daily.)   EPINEPHrine 0.3 mg/0.3 mL IJ SOAJ injection Inject 0.3 mg into the muscle once.   ferrous sulfate 325 (65 FE) MG EC tablet Take 325 mg by mouth. One daily   gabapentin (NEURONTIN) 400 MG capsule Take 400 mg by mouth in the morning. & 800 mg at bedtime   glipiZIDE (GLUCOTROL XL) 5 MG 24 hr tablet Take 1 tablet (5 mg total) by mouth daily with breakfast.   insulin glargine, 1 Unit Dial, (TOUJEO SOLOSTAR) 300 UNIT/ML Solostar Pen Inject 44 Units into the skin at bedtime.   lamoTRIgine (LAMICTAL) 150 MG tablet Take 1 tablet (150 mg total) by mouth at bedtime.   levothyroxine (SYNTHROID) 112 MCG tablet Take 112 mcg by mouth daily before breakfast.   losartan (COZAAR) 50 MG tablet Take 1 tablet (50 mg total) by mouth every morning.   meclizine (ANTIVERT) 25 MG tablet Take 1 tablet (25 mg total) by mouth 3 (three) times daily as needed for dizziness. (Patient taking differently: Take 25 mg by mouth daily as needed for dizziness.)   mupirocin ointment (BACTROBAN) 2 %    ondansetron (ZOFRAN) 4 MG tablet Take 1 tablet (4 mg total) by mouth every 8 (eight) hours as needed for nausea or vomiting.   OVER THE COUNTER MEDICATION daily as needed (pain). CBD ointment/roll on   pantoprazole (PROTONIX) 40 MG tablet Take 1 tablet by mouth daily  before supper.   REPATHA SURECLICK 140 MG/ML SOAJ INJECT ONE DOSE INTO SKIN EVERY 14 DAYS.   rOPINIRole (REQUIP) 3 MG tablet TAKE ONE TABLET BY MOUTH EVERY MORNING, ,NOON AND AT BEDTIME. (PRT PT MORNING,EVENING,BEDTIME) (Patient taking differently: Take 3 mg by mouth 3 (three) times daily.)   tiZANidine (ZANAFLEX) 2 MG tablet Take 2 mg by mouth 2 (two) times daily.   torsemide (DEMADEX) 10 MG tablet Take 2 tablets (20 mg total) by mouth daily.   venlafaxine XR (EFFEXOR-XR) 150 MG 24 hr capsule TAKE TWO (2) CAPSULES BY MOUTH AT BEDTIME   Vitamin D, Ergocalciferol, (DRISDOL) 1.25 MG (50000 UNIT)  CAPS capsule Take 50,000 Units by mouth every 7 (seven) days. Sunday   [DISCONTINUED] insulin glargine, 1 Unit Dial, (TOUJEO SOLOSTAR) 300 UNIT/ML Solostar Pen Inject 40 Units into the skin at bedtime.   No facility-administered encounter medications on file as of 10/25/2022.    ALLERGIES: Allergies  Allergen Reactions   Amoxicillin Anaphylaxis and Other (See Comments)    Has patient had a PCN reaction causing immediate rash, facial/tongue/throat swelling, SOB or lightheadedness with hypotension: Yes Has patient had a PCN reaction causing severe rash involving mucus membranes or skin necrosis: Yes Has patient had a PCN reaction that required hospitalization: Yes Has patient had a PCN reaction occurring within the last 10 years: Yes If all of the above answers are "NO", then may proceed with Cephalosporin use.    Hydrocodone Anaphylaxis   Percocet [Oxycodone-Acetaminophen] Other (See Comments)    Causes chest pain /tightness. Pressure pain around her ribs.   Acetaminophen    Depacon [Valproate Sodium]    Depacon [Valproic Acid] Other (See Comments)    Causes falls    Oxycodone    Oxycodone Hcl    Prednisone Itching    VACCINATION STATUS: Immunization History  Administered Date(s) Administered   Influenza,inj,Quad PF,6+ Mos 01/20/2018   Janssen (J&J) SARS-COV-2 Vaccination 03/17/2020   Pneumococcal Polysaccharide-23 01/30/2015   Td,absorbed, Preservative Free, Adult Use, Lf Unspecified 07/31/1999   Tdap 07/31/1999, 04/08/2020    Diabetes She presents for her follow-up diabetic visit. She has type 2 diabetes mellitus. Onset time: She was diagnosed at approximate age of 47 years.  This patient was previously seen in this clinic for diabetic management, last visit was in 2021. Her disease course has been improving. There are no hypoglycemic associated symptoms. Pertinent negatives for hypoglycemia include no confusion, headaches, pallor or seizures. Associated symptoms include  fatigue. Pertinent negatives for diabetes include no blurred vision, no chest pain, no polydipsia, no polyphagia and no polyuria. There are no hypoglycemic complications. Symptoms are improving. Diabetic complications include heart disease and nephropathy. Risk factors for coronary artery disease include dyslipidemia, diabetes mellitus, hypertension, obesity, family history, post-menopausal, sedentary lifestyle and tobacco exposure. Current diabetic treatment includes insulin injections. Her weight is decreasing steadily. She is following a generally unhealthy diet. When asked about meal planning, she reported none. She has had a previous visit with a dietitian. She rarely participates in exercise. Her home blood glucose trend is fluctuating minimally. Her breakfast blood glucose range is generally 180-200 mg/dl. Her lunch blood glucose range is generally 180-200 mg/dl. Her dinner blood glucose range is generally 180-200 mg/dl. Her bedtime blood glucose range is generally 180-200 mg/dl. Her overall blood glucose range is 180-200 mg/dl. (She presents with improved glycemic profile using her CGM.  Her AGP report shows 47 time in range, 44% level 1 hyperglycemia, 9% Libre to hyperglycemia.  He has no hypoglycemia.  Her average blood glucose is 183 for the last 14 days.  ) An ACE inhibitor/angiotensin II receptor blocker is being taken. Eye exam is current.  Hypertension This is a chronic problem. Pertinent negatives include no blurred vision, chest pain, headaches, palpitations or shortness of breath. Risk factors for coronary artery disease include dyslipidemia, diabetes mellitus, family history, obesity, post-menopausal state, smoking/tobacco exposure and sedentary lifestyle. Past treatments include angiotensin blockers. Hypertensive end-organ damage includes CAD/MI. Identifiable causes of hypertension include chronic renal disease.     Review of Systems  Constitutional:  Positive for fatigue. Negative for  chills, fever and unexpected weight change.  HENT:  Negative for trouble swallowing and voice change.   Eyes:  Negative for blurred vision and visual disturbance.  Respiratory:  Negative for cough, shortness of breath and wheezing.   Cardiovascular:  Negative for chest pain, palpitations and leg swelling.  Gastrointestinal:  Negative for diarrhea, nausea and vomiting.  Endocrine: Negative for cold intolerance, heat intolerance, polydipsia, polyphagia and polyuria.  Musculoskeletal:  Negative for arthralgias and myalgias.  Skin:  Negative for color change, pallor, rash and wound.  Neurological:  Negative for seizures and headaches.  Psychiatric/Behavioral:  Negative for confusion and suicidal ideas.     Objective:       10/25/2022   11:08 AM 10/03/2022    1:14 PM 09/19/2022   10:42 AM  Vitals with BMI  Height 5\' 0"  5\' 0"  5\' 0"   Weight 260 lbs 13 oz 257 lbs 10 oz 261 lbs  BMI 50.93 50.31 50.97  Systolic 130 126 409  Diastolic 60 72 70  Pulse 80 75 81    BP 130/60   Pulse 80   Ht 5' (1.524 m)   Wt 260 lb 12.8 oz (118.3 kg)   BMI 50.93 kg/m   Wt Readings from Last 3 Encounters:  10/25/22 260 lb 12.8 oz (118.3 kg)  10/03/22 257 lb 9.6 oz (116.8 kg)  09/19/22 261 lb (118.4 kg)       CMP ( most recent) CMP     Component Value Date/Time   NA 136 (A) 10/09/2022 0000   K 4.3 10/09/2022 0000   CL 95 (A) 10/09/2022 0000   CO2 24 (A) 10/09/2022 0000   GLUCOSE 249 (H) 09/10/2022 1036   GLUCOSE 171 (H) 07/13/2021 0230   BUN 38 (A) 10/09/2022 0000   CREATININE 2.3 (A) 10/09/2022 0000   CREATININE 1.64 (H) 09/10/2022 1036   CREATININE 1.10 (H) 03/05/2019 1221   CALCIUM 9.4 10/09/2022 0000   PROT 7.1 07/05/2021 1400   PROT 6.9 01/11/2021 1014   ALBUMIN 3.9 10/09/2022 0000   ALBUMIN 4.1 01/11/2021 1014   AST 13 10/09/2022 0000   ALT 13 10/09/2022 0000   ALKPHOS 82 10/09/2022 0000   BILITOT 0.6 07/05/2021 1400   BILITOT 0.2 01/11/2021 1014   GFRNONAA 41 (L) 07/13/2021 0230    GFRNONAA 53 (L) 03/05/2019 1221   GFRAA 56 (L) 04/23/2019 1511   GFRAA 61 03/05/2019 1221     Diabetic Labs (most recent): Lab Results  Component Value Date   HGBA1C 9.6 10/09/2022   HGBA1C 7.6 06/03/2022   HGBA1C 7.4 (H) 07/05/2021   MICROALBUR 0.9 01/29/2018   MICROALBUR 1.5 07/10/2016     Lipid Panel ( most recent) Lipid Panel     Component Value Date/Time   CHOL 139 10/09/2022 0000   TRIG 123 10/09/2022 0000   HDL 55 10/09/2022 0000   CHOLHDL 3.8 03/05/2019 1221   VLDL 31 (H)  07/10/2016 0934   LDLCALC 62 10/09/2022 0000   LDLCALC 152 (H) 03/05/2019 1221      Lab Results  Component Value Date   TSH 1.73 10/09/2022   TSH 1.49 03/05/2019   TSH 2.671 10/30/2018   TSH 3.05 04/24/2018   TSH 2.16 01/29/2018   TSH 3.71 07/11/2017   TSH 2.13 07/10/2016   TSH 1.766 05/12/2015   FREET4 1.2 03/05/2019   FREET4 1.05 10/30/2018   FREET4 1.2 01/29/2018   FREET4 1.4 07/11/2017   FREET4 1.0 07/10/2016   FREET4 1.22 05/12/2015       Assessment & Plan:   1. DM type 2 causing vascular disease  - LILLIENNE INGEMI has currently uncontrolled symptomatic type 2 DM since  72 years of age. She presents with improved glycemic profile using her CGM.  Her AGP report shows 47 time in range, 44% level 1 hyperglycemia, 9% Libre to hyperglycemia.  He has no hypoglycemia.  Her average blood glucose is 183 for the last 14 days.   Her most recent ultrasound A1c was 9.6%. - Recent labs reviewed. - I had a long discussion with her about the possible risk factors and  the pathology behind its diabetes and its complications. -her diabetes is complicated by coronary artery disease, CKD, obesity/sedentary life, polypharmacy, and she remains at a high risk for more acute and chronic complications which include CAD, CVA, CKD, retinopathy, and neuropathy. These are all discussed in detail with her.  - I discussed all available options of managing her diabetes including de-escalation of  medications. I have counseled her on Food as Medicine by adopting a Whole Food , Plant Predominant  ( WFPP) nutrition as recommended by [employer] of Lifestyle Medicine. Patient is encouraged to switch to  unprocessed or minimally processed  complex starch, adequate protein intake (mainly plant source), minimal liquid fat, plenty of fruits, and vegetables. -  she is advised to stick to a routine mealtimes to eat 3 complete meals a day and snack only when necessary ( to snack only to correct hypoglycemia BG <70 day time or <100 at night).   - she acknowledges that there is a room for improvement in her food and drink choices. - Suggestion is made for her to avoid simple carbohydrates  from her diet including Cakes, Sweet Desserts, Ice Cream, Soda (diet and regular), Sweet Tea, Candies, Chips, Cookies, Store Bought Juices, Alcohol , Artificial Sweeteners,  Coffee Creamer, and "Sugar-free" Products, Lemonade. This will help patient to have more stable blood glucose profile and potentially avoid unintended weight gain.  The following Lifestyle Medicine recommendations according to American College of Lifestyle Medicine  [facility]) were discussed and and offered to patient and she  agrees to start the journey:  A. Whole Foods, Plant-Based Nutrition comprising of fruits and vegetables, plant-based proteins, whole-grain carbohydrates was discussed in detail with the patient.   A list for source of those nutrients were also provided to the patient.  Patient will use only water or unsweetened tea for hydration. B.  The need to stay away from risky substances including alcohol, smoking; obtaining 7 to 9 hours of restorative sleep, at least 150 minutes of moderate intensity exercise weekly, the importance of healthy social connections,  and stress management techniques were discussed. C.  A full color page of  Calorie density of various food groups per pound showing examples of each food groups was provided to the  patient.  - she will be scheduled with Norm Salt, RDN,  CDE for individualized diabetes education.  - I have approached her with the following individualized plan to manage  her diabetes and patient agrees:   - she  is advised to increase her Toujeo to 44 units nightly,   associated with utilizing her CGM continuously.   - She was advised by her nephrologist to lowered Jardiance to 5 mg p.o. daily at breakfast.  She is also on glipizide 5 mg XL p.o. daily at breakfast.    - she is encouraged to call clinic for blood glucose levels less than 70 or above 300 mg /dl.  - Specific targets for  A1c;  LDL, HDL,  and Triglycerides were discussed with the patient.  2) Blood Pressure /Hypertension:   -Her blood pressure is controlled to target.   she is advised to continue her current medications including losartan 50 mg p.o. daily with breakfast . 3) Lipids/Hyperlipidemia:   Review of her recent lipid panel showed controlled LDL at 43.    she  is advised to continue    Repatha 140 mg subcutaneously every other week.   4)  Weight/Diet:  Body mass index is 50.93 kg/m.  -   clearly complicating her diabetes care.   she is  a candidate for weight loss. I discussed with her the fact that loss of 5 - 10% of her  current body weight will have the most impact on her diabetes management.  The above detailed  ACLM recommendations for nutrition, exercise, sleep, social life, avoidance of risky substances, the need for restorative sleep   information will also detailed on discharge instructions.  5) hypothyroidism: Longstanding diagnosis.  No recent thyroid function test to review.  She is advised to continue levothyroxine 112 mcg p.o. daily before breakfast.     - We discussed about the correct intake of her thyroid hormone, on empty stomach at fasting, with water, separated by at least 30 minutes from breakfast and other medications,  and separated by more than 4 hours from calcium, iron, multivitamins, acid  reflux medications (PPIs). -Patient is made aware of the fact that thyroid hormone replacement is needed for life, dose to be adjusted by periodic monitoring of thyroid function tests.    6) Chronic Care/Health Maintenance:  -she  is on ARB  medications and  is encouraged to initiate and continue to follow up with Ophthalmology, Dentist,  Podiatrist at least yearly or according to recommendations, and advised to   stay away from smoking. I have recommended yearly flu vaccine and pneumonia vaccine at least every 5 years; moderate intensity exercise for up to 150 minutes weekly; and  sleep for 7- 9 hours a day.  - she is  advised to maintain close follow up with [name], MD for primary care needs, as well as her other providers for optimal and coordinated care.    I spent  26  minutes in the care of the patient today including review of labs from CMP, Lipids, Thyroid Function, Hematology (current and previous including abstractions from other facilities); face-to-face time discussing  her blood glucose readings/logs, discussing hypoglycemia and hyperglycemia episodes and symptoms, medications doses, her options of short and long term treatment based on the latest standards of care / guidelines;  discussion about incorporating lifestyle medicine;  and documenting the encounter. Risk reduction counseling performed per USPSTF guidelines to reduce  obesity and cardiovascular risk factors.     Please refer to Patient Instructions for Blood Glucose Monitoring and Insulin/Medications Dosing Guide"  in  media tab for additional information. Please  also refer to " Patient Self Inventory" in the Media  tab for reviewed elements of pertinent patient history.  Sherry Ruffing participated in the discussions, expressed understanding, and voiced agreement with the above plans.  All questions were answered to her satisfaction. she is encouraged to contact clinic should she have any questions or concerns prior  to her return visit.   Follow up plan: - Return in about 3 months (around 01/25/2023) for Bring Meter/CGM Device/Logs- A1c in Office.  Marquis Lunch, MD [facility] [address] [place], [place] [address] Phone: [phone]  Fax: [phone]    10/25/2022, 3:51 PM  This note was partially dictated with voice recognition software. Similar sounding words can be transcribed inadequately or may not  be corrected upon review.

## 2022-10-25 NOTE — Patient Instructions (Signed)

## 2022-10-28 DIAGNOSIS — E875 Hyperkalemia: Secondary | ICD-10-CM | POA: Diagnosis not present

## 2022-10-28 DIAGNOSIS — E1129 Type 2 diabetes mellitus with other diabetic kidney complication: Secondary | ICD-10-CM | POA: Diagnosis not present

## 2022-10-28 DIAGNOSIS — E1122 Type 2 diabetes mellitus with diabetic chronic kidney disease: Secondary | ICD-10-CM | POA: Diagnosis not present

## 2022-10-28 DIAGNOSIS — I5042 Chronic combined systolic (congestive) and diastolic (congestive) heart failure: Secondary | ICD-10-CM | POA: Diagnosis not present

## 2022-10-28 DIAGNOSIS — R809 Proteinuria, unspecified: Secondary | ICD-10-CM | POA: Diagnosis not present

## 2022-10-28 DIAGNOSIS — N170 Acute kidney failure with tubular necrosis: Secondary | ICD-10-CM | POA: Diagnosis not present

## 2022-10-28 DIAGNOSIS — N189 Chronic kidney disease, unspecified: Secondary | ICD-10-CM | POA: Diagnosis not present

## 2022-11-04 DIAGNOSIS — E1129 Type 2 diabetes mellitus with other diabetic kidney complication: Secondary | ICD-10-CM | POA: Diagnosis not present

## 2022-11-04 DIAGNOSIS — I5042 Chronic combined systolic (congestive) and diastolic (congestive) heart failure: Secondary | ICD-10-CM | POA: Diagnosis not present

## 2022-11-04 DIAGNOSIS — E1122 Type 2 diabetes mellitus with diabetic chronic kidney disease: Secondary | ICD-10-CM | POA: Diagnosis not present

## 2022-11-04 DIAGNOSIS — N170 Acute kidney failure with tubular necrosis: Secondary | ICD-10-CM | POA: Diagnosis not present

## 2022-11-05 DIAGNOSIS — L97521 Non-pressure chronic ulcer of other part of left foot limited to breakdown of skin: Secondary | ICD-10-CM | POA: Diagnosis not present

## 2022-11-07 ENCOUNTER — Other Ambulatory Visit (INDEPENDENT_AMBULATORY_CARE_PROVIDER_SITE_OTHER): Payer: Medicare Other

## 2022-11-07 ENCOUNTER — Ambulatory Visit (INDEPENDENT_AMBULATORY_CARE_PROVIDER_SITE_OTHER): Payer: Medicare Other | Admitting: Orthopedic Surgery

## 2022-11-07 VITALS — BP 118/62 | HR 72 | Ht 60.0 in | Wt 251.0 lb

## 2022-11-07 DIAGNOSIS — M1812 Unilateral primary osteoarthritis of first carpometacarpal joint, left hand: Secondary | ICD-10-CM | POA: Diagnosis not present

## 2022-11-07 DIAGNOSIS — M65342 Trigger finger, left ring finger: Secondary | ICD-10-CM

## 2022-11-07 DIAGNOSIS — M79642 Pain in left hand: Secondary | ICD-10-CM | POA: Diagnosis not present

## 2022-11-07 MED ORDER — METHYLPREDNISOLONE ACETATE 40 MG/ML IJ SUSP
40.0000 mg | Freq: Once | INTRAMUSCULAR | Status: AC
Start: 2022-11-07 — End: 2022-11-07
  Administered 2022-11-07: 40 mg via INTRA_ARTICULAR

## 2022-11-07 NOTE — Addendum Note (Signed)
Addended byCaffie Damme on: 11/07/2022 04:36 PM   Modules accepted: Orders

## 2022-11-07 NOTE — Progress Notes (Signed)
Chief Complaint  Patient presents with   Hand Pain    Left hand pain    72 year old female with left ring finger catching and locking and pain over the A1 pulley and then she also has pain in the basilar joint of the left thumb   Examination of the left hand shows that there is tenderness over the A1 pulley of the left ring finger I could not get it to lock or pop  She has pain in the [facility] joint of the left thumb with a positive compression grind test  Imaging was obtained  Recommend standard course of splinting for the left thumb and recommend injection of the left ring finger for triggering  Encounter Diagnoses  Name Primary?   Pain in left hand Yes   Degenerative arthritis of thumb, left    Trigger finger, left ring finger    Trigger finger injection  Diagnosis  left ring Procedure injection A1 pulley Medications lidocaine 1% 1 mL and Depo-Medrol 40 mg 1 mL Skin prep alcohol and ethyl chloride Verbal consent was obtained Timeout confirmed the injection site  After cleaning the skin with alcohol and anesthetizing the skin with ethyl chloride the A1 pulley was palpated and the injection was performed without complication   Splint for thumb OA

## 2022-11-12 ENCOUNTER — Encounter (HOSPITAL_COMMUNITY): Payer: Self-pay | Admitting: Psychiatry

## 2022-11-12 ENCOUNTER — Telehealth (INDEPENDENT_AMBULATORY_CARE_PROVIDER_SITE_OTHER): Payer: Medicare Other | Admitting: Psychiatry

## 2022-11-12 DIAGNOSIS — F3131 Bipolar disorder, current episode depressed, mild: Secondary | ICD-10-CM | POA: Diagnosis not present

## 2022-11-12 DIAGNOSIS — F331 Major depressive disorder, recurrent, moderate: Secondary | ICD-10-CM

## 2022-11-12 MED ORDER — VENLAFAXINE HCL ER 150 MG PO CP24: ORAL_CAPSULE | ORAL | 2 refills | Status: DC

## 2022-11-12 MED ORDER — ALPRAZOLAM 0.25 MG PO TABS: 0.2500 mg | ORAL_TABLET | Freq: Every evening | ORAL | 2 refills | Status: DC | PRN

## 2022-11-12 MED ORDER — LAMOTRIGINE 150 MG PO TABS: 150.0000 mg | ORAL_TABLET | Freq: Every day | ORAL | 2 refills | Status: DC

## 2022-11-12 MED ORDER — BUPROPION HCL ER (XL) 150 MG PO TB24: 150.0000 mg | ORAL_TABLET | Freq: Every morning | ORAL | 2 refills | Status: DC

## 2022-11-12 MED ORDER — ARIPIPRAZOLE 5 MG PO TABS: 5.0000 mg | ORAL_TABLET | Freq: Every day | ORAL | 2 refills | Status: DC

## 2022-11-12 NOTE — Progress Notes (Signed)
Virtual Visit via Telephone Note  I connected with Norma [name] on 11/12/22 at 11:00 AM EDT by telephone and verified that I am speaking with the correct person using two identifiers.  Location: Patient: home Provider: office   I discussed the limitations, risks, security and privacy concerns of performing an evaluation and management service by telephone and the availability of in person appointments. I also discussed with the patient that there may be a patient responsible charge related to this service. The patient expressed understanding and agreed to proceed.     I discussed the assessment and treatment plan with the patient. The patient was provided an opportunity to ask questions and all were answered. The patient agreed with the plan and demonstrated an understanding of the instructions.   The patient was advised to call back or seek an in-person evaluation if the symptoms worsen or if the condition fails to improve as anticipated.  I provided 20 minutes of non-face-to-face time during this encounter.   Diannia Ruder, MD  [facility] MD/PA/NP OP Progress Note  11/12/2022 11:11 AM Norma [name]  MRN:  [id number]  Chief Complaint:  Chief Complaint  Patient presents with   Depression   Anxiety   Follow-up   HPI: This patient is a 72 year old white female who lives alone in [place].  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  The patient returns for follow-up after 3 months regarding her mood swings and depression.  Last time she was more depressed and felt more isolated be because she had to sell her vehicle.  She could no longer drive because of the neuropathy in her feet.  She was feeling somewhat sad and isolated.  We did increase her Abilify to 5 mg daily and it seems to have helped her mood.  She states that she is no longer feeling depressed.  She is spending her time doing crafts.  She recently burned herself on the legs with some hot water but other than  that her health has been stable.  She is still dealing with shortness of breath and trying to get better control of her blood sugar.  She denies significant depression anxiety thoughts of self-harm or suicide.  She denies current mood swings or racing thoughts. Visit Diagnosis:    ICD-10-CM   1. Bipolar affective disorder, currently depressed, mild (HCC)  F31.31     2. Moderate episode of recurrent major depressive disorder (HCC)  F33.1       Past Psychiatric History: She was hospitalized in 2001 and went through a course of ECT.  She has been tried on [employer] Wellbutrin and Zyprexa   Past Medical History:  Past Medical History:  Diagnosis Date   Anemia    Anxiety    Bipolar disorder (HCC)    Bulging lumbar disc    L3-4   Chronic daily headache    Chronic low back pain 09/20/2014   CKD (chronic kidney disease)    stage 3   COPD (chronic obstructive pulmonary disease) (HCC)    Coronary artery disease    CABG 2019   Degenerative arthritis    Depression    Diabetes mellitus, type II (HCC)    Diabetic neuropathy (HCC) 09/16/2018   DM type 2 with diabetic peripheral neuropathy (HCC) 09/20/2014   Dyslipidemia    Dyspnea    Family history of adverse reaction to anesthesia    father had reaction to anesthesia medication per patient "they don't  use it anymore"- unsure of reaction   Fatty liver    per pt report   Gastroparesis    GERD (gastroesophageal reflux disease)    Heart murmur    History of hiatal hernia    pt states she no longer has this   Hypertension    Hypothyroidism    IBS (irritable bowel syndrome)    LBBB (left bundle branch block)    Memory difficulty 09/20/2014   Morbid obesity (HCC)    Myocardial infarction (HCC)    per pt- prior to CABG   Neuropathy    Obstructive sleep apnea on CPAP    Renal insufficiency    Restless legs syndrome (RLS) 09/17/2012   Stroke (cerebrum) (HCC) 05/16/2017   Left parietal   Urticaria     Past Surgical History:   Procedure Laterality Date   ABDOMINAL HYSTERECTOMY     ARTHROSCOPY KNEE W/ DRILLING  06/2011   and Decemer of 2013.   Carpel tunnel  1980's   CATARACT EXTRACTION W/PHACO Right 03/21/2017   Procedure: CATARACT EXTRACTION PHACO AND INTRAOCULAR LENS PLACEMENT RIGHT EYE;  Surgeon: Fabio Pierce, MD;  Location: [facility];  Service: Ophthalmology;  Laterality: Right;  CDE: 2.91    CATARACT EXTRACTION W/PHACO Left 04/18/2017   Procedure: CATARACT EXTRACTION PHACO AND INTRAOCULAR LENS PLACEMENT LEFT EYE;  Surgeon: Fabio Pierce, MD;  Location: [facility];  Service: Ophthalmology;  Laterality: Left;  left   CHOLECYSTECTOMY     COLONOSCOPY WITH PROPOFOL N/A 10/04/2016   Procedure: COLONOSCOPY WITH PROPOFOL;  Surgeon: Malissa Hippo, MD;  Location: [facility] END[facility] SUITE;  Service: Endoscopy;  Laterality: N/A;  11:10   CORONARY ARTERY BYPASS GRAFT N/A 08/29/2017   Procedure: CORONARY ARTERY BYPASS GRAFTING (CABG) x 3 WITH ENDOSCOPIC HARVESTING OF RIGHT SAPHENOUS VEIN;  Surgeon: Kerin Perna, MD;  Location: [facility] OR;  Service: Open Heart Surgery;  Laterality: N/A;   ESOPHAGOGASTRODUODENOSCOPY N/A 04/25/2016   Procedure: ESOPHAGOGASTRODUODENOSCOPY (EGD);  Surgeon: Malissa Hippo, MD;  [facility]: [facility];  Service: Endoscopy;  Laterality: N/A;  730   LEFT HEART CATH AND CORONARY ANGIOGRAPHY N/A 08/27/2017   Procedure: LEFT HEART CATH AND CORONARY ANGIOGRAPHY;  Surgeon: Tonny Bollman, MD;  Location: [facility]IVE [facility];  Service: Cardiovascular::  pLAD 95% - p-mLAD 50%, ostD1 90% -pD1 80%; ostOM1 90%; rPDA 80%. EF ~50-55% - HK of dital Anterolateral & Apical wall.  - Rec CVTS c/s   NECK SURGERY     pt reports having growth removed from the back of her neck- after 2021   OPEN REDUCTION INTERNAL FIXATION (ORIF) HAND Left 04/10/2020   Procedure: OPEN REDUCTION INTERNAL FIXATION (ORIF) HAND, left index finger;  Surgeon: Vickki Hearing, MD;  Location: [facility];  Service: Orthopedics;  Laterality: Left;  0.045 k  wires   RECTAL SURGERY     fissure   SHOULDER SURGERY Left    arthroscopy in March of this year   TEE WITHOUT CARDIOVERSION N/A 08/29/2017   Procedure: TRANSESOPHAGEAL ECHOCARDIOGRAM (TEE);  Surgeon: Donata Clay, Theron Arista, MD;  Location: [facility] OR;  Service: Open Heart Surgery;  Laterality: N/A;   TRANSFORAMINAL LUMBAR INTERBODY FUSION W/ MIS 1 LEVEL Right 07/12/2021   Procedure: Minimally Invasive Surgery Transforaminal Lumbar Interbody Fusion  Lumbar four-five;  Surgeon: Bedelia Person, MD;  Location: [facility] OR;  Service: Neurosurgery;  Laterality: Right;   TRANSTHORACIC ECHOCARDIOGRAM  06/06/2017   Mild to moderate reduced EF 40 and 45%.  Anterior septal, inferoseptal and basal to mid inferior hypokinesis.  GR 1 DD.  No significant valvular lesion    Family Psychiatric History: See below  Family History:  Family History  Problem Relation Age of Onset   Hypertension Mother    Lymphoma Mother    Depression Mother    Arthritis Father    Alcohol abuse Father    Hypertension Sister    Cancer Brother        kidney and lung   Alcohol abuse Brother    Alcohol abuse Paternal Uncle    Alcohol abuse Paternal Grandfather    Alcohol abuse Paternal Grandmother    Allergic rhinitis Neg Hx    Angioedema Neg Hx    Asthma Neg Hx    Atopy Neg Hx    Eczema Neg Hx    Immunodeficiency Neg Hx    Urticaria Neg Hx     Social History:  Social History   Socioeconomic History   Marital status: Single    Spouse name: Not on file   Number of children: 0   Years of education: 16   Highest education level: Not on file  Occupational History    Employer: [employer]  Tobacco Use   Smoking status: Former    Current packs/day: 0.00    Types: Cigarettes    Quit date: 02/04/1971    Years since quitting: 51.8   Smokeless tobacco: Never   Tobacco comments:    smoked 2 cigarettes a day  Vaping Use   Vaping status: Never Used  Substance and Sexual Activity   Alcohol use: No    Alcohol/week:  0.0 standard drinks of alcohol   Drug use: No   Sexual activity: Never  Other Topics Concern   Not on file  Social History Narrative   Patient lives at home alone.    Patient has no children.    Patient has her masters in nursing.    Patient is single.    Patient drinks about 2 glasses of tea daily.   Patient is right handed.   Social Determinants of Health   Financial Resource Strain: Medium Risk (12/18/2021)   Overall Financial Resource Strain (CARDIA)    Difficulty of Paying Living Expenses: Somewhat hard  Food Insecurity: No Food Insecurity (12/18/2021)   Hunger Vital Sign    Worried About Running Out of Food in the Last Year: Never true    Ran Out of Food in the Last Year: Never true  Transportation Needs: No Transportation Needs (12/18/2021)   PRAPARE - Administrator, Civil Service (Medical): No    Lack of Transportation (Non-Medical): No  Physical Activity: Not on file  Stress: No Stress Concern Present (10/04/2021)   [employer] of Occupational Health - Occupational Stress Questionnaire    Feeling of Stress : Only a little  Social Connections: Not on file    Allergies:  Allergies  Allergen Reactions   Amoxicillin Anaphylaxis and Other (See Comments)    Has patient had a PCN reaction causing immediate rash, facial/tongue/throat swelling, SOB or lightheadedness with hypotension: Yes Has patient had a PCN reaction causing severe rash involving mucus membranes or skin necrosis: Yes Has patient had a PCN reaction that required hospitalization: Yes Has patient had a PCN reaction occurring within the last 10 years: Yes If all of the above answers are "NO", then may proceed with Cephalosporin use.    Hydrocodone Anaphylaxis   Percocet [Oxycodone-Acetaminophen] Other (See Comments)    Causes chest pain /tightness. Pressure pain around her ribs.  Acetaminophen    Depacon [Valproate Sodium]    Depacon [Valproic Acid] Other (See Comments)    Causes falls     Oxycodone    Oxycodone Hcl    Prednisone Itching    Metabolic Disorder Labs: Lab Results  Component Value Date   HGBA1C 9.6 10/09/2022   MPG 165.68 07/05/2021   MPG 148 03/05/2019   No results found for: "PROLACTIN" Lab Results  Component Value Date   CHOL 139 10/09/2022   TRIG 123 10/09/2022   HDL 55 10/09/2022   CHOLHDL 3.8 03/05/2019   VLDL 31 (H) 07/10/2016   LDLCALC 62 10/09/2022   LDLCALC 43 11/08/2019   Lab Results  Component Value Date   TSH 1.73 10/09/2022   TSH 1.49 03/05/2019    Therapeutic Level Labs: No results found for: "LITHIUM" No results found for: "VALPROATE" No results found for: "CBMZ"  Current Medications: Current Outpatient Medications  Medication Sig Dispense Refill   Acetaminophen (TYLENOL ARTHRITIS EXT RELIEF PO)      albuterol (VENTOLIN HFA) 108 (90 Base) MCG/ACT inhaler Inhale into the lungs every 6 (six) hours as needed for wheezing or shortness of breath.     ALPRAZolam (XANAX) 0.25 MG tablet Take 1 tablet (0.25 mg total) by mouth at bedtime as needed for anxiety or sleep. 30 tablet 2   ARIPiprazole (ABILIFY) 5 MG tablet Take 1 tablet (5 mg total) by mouth daily. 30 tablet 2   aspirin EC 81 MG tablet Take 1 tablet (81 mg total) by mouth daily. 90 tablet 3   azelastine (ASTELIN) 0.1 % nasal spray Place into both nostrils 2 (two) times daily. Use in each nostril as directed     buPROPion (WELLBUTRIN XL) 150 MG 24 hr tablet Take 1 tablet (150 mg total) by mouth every morning. 30 tablet 2   carvedilol (COREG) 6.25 MG tablet Take 1 tablet (6.25 mg total) by mouth 2 (two) times daily with a meal. 180 tablet 2   diclofenac Sodium (VOLTAREN) 1 % GEL Apply 2 g topically 4 (four) times daily as needed (pain).     diphenhydrAMINE (BENADRYL) 25 mg capsule Take 25 mg by mouth as needed for allergies or itching.     empagliflozin (JARDIANCE) 10 MG TABS tablet Take 1 tablet (10 mg total) by mouth daily. (Patient taking differently: Take 5 mg by mouth  daily.) 90 tablet 1   EPINEPHrine 0.3 mg/0.3 mL IJ SOAJ injection Inject 0.3 mg into the muscle once.     ferrous sulfate 325 (65 FE) MG EC tablet Take 325 mg by mouth. One daily     gabapentin (NEURONTIN) 400 MG capsule Take 400 mg by mouth in the morning. & 800 mg at bedtime     glipiZIDE (GLUCOTROL XL) 5 MG 24 hr tablet Take 1 tablet (5 mg total) by mouth daily with breakfast. 90 tablet 1   insulin glargine, 1 Unit Dial, (TOUJEO SOLOSTAR) 300 UNIT/ML Solostar Pen Inject 44 Units into the skin at bedtime. 12 mL 1   lamoTRIgine (LAMICTAL) 150 MG tablet Take 1 tablet (150 mg total) by mouth at bedtime. 30 tablet 2   levothyroxine (SYNTHROID) 112 MCG tablet Take 112 mcg by mouth daily before breakfast.     losartan (COZAAR) 50 MG tablet Take 1 tablet (50 mg total) by mouth every morning. 90 tablet 1   meclizine (ANTIVERT) 25 MG tablet Take 1 tablet (25 mg total) by mouth 3 (three) times daily as needed for dizziness. (Patient taking differently: Take 25  mg by mouth daily as needed for dizziness.) 30 tablet 0   mupirocin ointment (BACTROBAN) 2 %      ondansetron (ZOFRAN) 4 MG tablet Take 1 tablet (4 mg total) by mouth every 8 (eight) hours as needed for nausea or vomiting. 20 tablet 0   OVER THE COUNTER MEDICATION daily as needed (pain). CBD ointment/roll on     pantoprazole (PROTONIX) 40 MG tablet Take 1 tablet by mouth daily before supper. 90 tablet 3   REPATHA SURECLICK 140 MG/ML SOAJ INJECT ONE DOSE INTO SKIN EVERY 14 DAYS. 2 mL 11   rOPINIRole (REQUIP) 3 MG tablet TAKE ONE TABLET BY MOUTH EVERY MORNING, ,NOON AND AT BEDTIME. (PRT PT MORNING,EVENING,BEDTIME) (Patient taking differently: Take 3 mg by mouth 3 (three) times daily.) 270 tablet 3   tiZANidine (ZANAFLEX) 2 MG tablet Take 2 mg by mouth 2 (two) times daily.     venlafaxine XR (EFFEXOR-XR) 150 MG 24 hr capsule TAKE TWO (2) CAPSULES BY MOUTH AT BEDTIME 60 capsule 2   Vitamin D, Ergocalciferol, (DRISDOL) 1.25 MG (50000 UNIT) CAPS capsule  Take 50,000 Units by mouth every 7 (seven) days. Sunday     No current facility-administered medications for this visit.     Musculoskeletal: Strength & Muscle Tone: na Gait & Station: na Patient leans: N/A  Psychiatric Specialty Exam: Review of Systems  Respiratory:  Positive for shortness of breath.   Neurological:  Positive for weakness and numbness.  All other systems reviewed and are negative.   There were no vitals taken for this visit.There is no height or weight on file to calculate BMI.  General Appearance: NA  Eye Contact:  NA  Speech:  Clear and Coherent  Volume:  Normal  Mood:  Euthymic  Affect:  NA  Thought Process:  Goal Directed  Orientation:  Full (Time, Place, and Person)  Thought Content: WDL   Suicidal Thoughts:  No  Homicidal Thoughts:  No  Memory:  Immediate;   Good  Judgement:  Good  Insight:  Fair  Psychomotor Activity:  Decreased  Concentration:  Concentration: Good and Attention Span: Good  Recall:  Good  Fund of Knowledge: Good  Language: Good  Akathisia:  No  Handed:  Right  AIMS (if indicated): not done  Assets:  Communication Skills Desire for Improvement Resilience Social Support  ADL's:  Intact  Cognition: WNL  Sleep:  Fair   Screenings: Mini-Mental    Flowsheet Row Office Visit from 02/18/2018 in [facility] Office Visit from 09/17/2017 in [facility]  Total Score (max 30 points ) 28 28      PHQ2-9    Flowsheet Row Video Visit from 08/13/2022 in [facility] [place] Video Visit from 01/21/2022 in [facility] [place] Video Visit from 11/26/2021 in [facility] Coordination from 11/14/2021 in [facility] Patient Outreach Telephone from 10/04/2021 in [facility]  PHQ-2 Total Score 1  1 4 2 1   PHQ-9 Total Score -- -- 9 11 --      Flowsheet Row Video Visit from 08/13/2022 in [facility] [place] Video Visit from 01/21/2022 in [facility] [place] ED from 01/13/2022 in [facility] Emergency Department at [facility]  C-SSRS RISK CATEGORY No Risk No Risk No Risk        Assessment and  Plan: This patient is a 72 year old female with a history of depression anxiety mood swings and insomnia.  She is doing better on her current regimen in terms of depression.  She will continue Wellbutrin XL 150 mg daily as well as Effexor XR 300 mg daily for depression, Abilify 5 mg daily for augmentation, Lamictal 100 mg daily for mood stabilization and Xanax 0.25 mg at bedtime as needed for anxiety or sleep.  She will return to see me in 3 months  Collaboration of Care: Collaboration of Care: Primary Care Provider AEB notes will be shared with PCP at patient's request  Patient/Guardian was advised Release of Information must be obtained prior to any record release in order to collaborate their care with an outside provider. Patient/Guardian was advised if they have not already done so to contact the registration department to sign all necessary forms in order for [place] to release information regarding their care.   Consent: Patient/Guardian gives verbal consent for treatment and assignment of benefits for services provided during this visit. Patient/Guardian expressed understanding and agreed to proceed.    Diannia Ruder, MD 11/12/2022, 11:11 AM

## 2022-11-13 DIAGNOSIS — E079 Disorder of thyroid, unspecified: Secondary | ICD-10-CM | POA: Diagnosis not present

## 2022-11-13 DIAGNOSIS — L97421 Non-pressure chronic ulcer of left heel and midfoot limited to breakdown of skin: Secondary | ICD-10-CM | POA: Diagnosis not present

## 2022-11-13 DIAGNOSIS — M1990 Unspecified osteoarthritis, unspecified site: Secondary | ICD-10-CM | POA: Diagnosis not present

## 2022-11-13 DIAGNOSIS — J449 Chronic obstructive pulmonary disease, unspecified: Secondary | ICD-10-CM | POA: Diagnosis not present

## 2022-11-13 DIAGNOSIS — T24212A Burn of second degree of left thigh, initial encounter: Secondary | ICD-10-CM | POA: Diagnosis not present

## 2022-11-13 DIAGNOSIS — Z87891 Personal history of nicotine dependence: Secondary | ICD-10-CM | POA: Diagnosis not present

## 2022-11-13 DIAGNOSIS — E11621 Type 2 diabetes mellitus with foot ulcer: Secondary | ICD-10-CM | POA: Insufficient documentation

## 2022-11-13 DIAGNOSIS — E1140 Type 2 diabetes mellitus with diabetic neuropathy, unspecified: Secondary | ICD-10-CM | POA: Diagnosis not present

## 2022-11-13 DIAGNOSIS — Z880 Allergy status to penicillin: Secondary | ICD-10-CM | POA: Diagnosis not present

## 2022-11-13 DIAGNOSIS — I10 Essential (primary) hypertension: Secondary | ICD-10-CM | POA: Diagnosis not present

## 2022-11-13 DIAGNOSIS — L97411 Non-pressure chronic ulcer of right heel and midfoot limited to breakdown of skin: Secondary | ICD-10-CM | POA: Diagnosis not present

## 2022-11-15 DIAGNOSIS — Z7984 Long term (current) use of oral hypoglycemic drugs: Secondary | ICD-10-CM | POA: Diagnosis not present

## 2022-11-15 DIAGNOSIS — D1724 Benign lipomatous neoplasm of skin and subcutaneous tissue of left leg: Secondary | ICD-10-CM | POA: Diagnosis not present

## 2022-11-15 DIAGNOSIS — T24212D Burn of second degree of left thigh, subsequent encounter: Secondary | ICD-10-CM | POA: Diagnosis not present

## 2022-11-15 DIAGNOSIS — I110 Hypertensive heart disease with heart failure: Secondary | ICD-10-CM | POA: Diagnosis not present

## 2022-11-15 DIAGNOSIS — L97521 Non-pressure chronic ulcer of other part of left foot limited to breakdown of skin: Secondary | ICD-10-CM | POA: Diagnosis not present

## 2022-11-15 DIAGNOSIS — M109 Gout, unspecified: Secondary | ICD-10-CM | POA: Diagnosis not present

## 2022-11-15 DIAGNOSIS — Z7982 Long term (current) use of aspirin: Secondary | ICD-10-CM | POA: Diagnosis not present

## 2022-11-15 DIAGNOSIS — Z604 Social exclusion and rejection: Secondary | ICD-10-CM | POA: Diagnosis not present

## 2022-11-15 DIAGNOSIS — Z9981 Dependence on supplemental oxygen: Secondary | ICD-10-CM | POA: Diagnosis not present

## 2022-11-15 DIAGNOSIS — E1140 Type 2 diabetes mellitus with diabetic neuropathy, unspecified: Secondary | ICD-10-CM | POA: Diagnosis not present

## 2022-11-15 DIAGNOSIS — Z794 Long term (current) use of insulin: Secondary | ICD-10-CM | POA: Diagnosis not present

## 2022-11-15 DIAGNOSIS — Z87891 Personal history of nicotine dependence: Secondary | ICD-10-CM | POA: Diagnosis not present

## 2022-11-15 DIAGNOSIS — E039 Hypothyroidism, unspecified: Secondary | ICD-10-CM | POA: Diagnosis not present

## 2022-11-15 DIAGNOSIS — I509 Heart failure, unspecified: Secondary | ICD-10-CM | POA: Diagnosis not present

## 2022-11-15 DIAGNOSIS — E11621 Type 2 diabetes mellitus with foot ulcer: Secondary | ICD-10-CM | POA: Diagnosis not present

## 2022-11-15 DIAGNOSIS — J449 Chronic obstructive pulmonary disease, unspecified: Secondary | ICD-10-CM | POA: Diagnosis not present

## 2022-11-15 DIAGNOSIS — Z8673 Personal history of transient ischemic attack (TIA), and cerebral infarction without residual deficits: Secondary | ICD-10-CM | POA: Diagnosis not present

## 2022-11-18 DIAGNOSIS — I110 Hypertensive heart disease with heart failure: Secondary | ICD-10-CM | POA: Diagnosis not present

## 2022-11-18 DIAGNOSIS — J449 Chronic obstructive pulmonary disease, unspecified: Secondary | ICD-10-CM | POA: Diagnosis not present

## 2022-11-18 DIAGNOSIS — E11621 Type 2 diabetes mellitus with foot ulcer: Secondary | ICD-10-CM | POA: Diagnosis not present

## 2022-11-18 DIAGNOSIS — E119 Type 2 diabetes mellitus without complications: Secondary | ICD-10-CM | POA: Diagnosis not present

## 2022-11-18 DIAGNOSIS — L97521 Non-pressure chronic ulcer of other part of left foot limited to breakdown of skin: Secondary | ICD-10-CM | POA: Diagnosis not present

## 2022-11-18 DIAGNOSIS — T24212D Burn of second degree of left thigh, subsequent encounter: Secondary | ICD-10-CM | POA: Diagnosis not present

## 2022-11-18 DIAGNOSIS — I509 Heart failure, unspecified: Secondary | ICD-10-CM | POA: Diagnosis not present

## 2022-11-20 DIAGNOSIS — Z87891 Personal history of nicotine dependence: Secondary | ICD-10-CM | POA: Diagnosis not present

## 2022-11-20 DIAGNOSIS — E11621 Type 2 diabetes mellitus with foot ulcer: Secondary | ICD-10-CM | POA: Diagnosis not present

## 2022-11-20 DIAGNOSIS — T2121XA Burn of second degree of chest wall, initial encounter: Secondary | ICD-10-CM | POA: Diagnosis not present

## 2022-11-20 DIAGNOSIS — L97411 Non-pressure chronic ulcer of right heel and midfoot limited to breakdown of skin: Secondary | ICD-10-CM | POA: Diagnosis not present

## 2022-11-20 DIAGNOSIS — T24212D Burn of second degree of left thigh, subsequent encounter: Secondary | ICD-10-CM | POA: Diagnosis not present

## 2022-11-20 DIAGNOSIS — J449 Chronic obstructive pulmonary disease, unspecified: Secondary | ICD-10-CM | POA: Diagnosis not present

## 2022-11-20 DIAGNOSIS — L97421 Non-pressure chronic ulcer of left heel and midfoot limited to breakdown of skin: Secondary | ICD-10-CM | POA: Diagnosis not present

## 2022-11-20 DIAGNOSIS — E1140 Type 2 diabetes mellitus with diabetic neuropathy, unspecified: Secondary | ICD-10-CM | POA: Diagnosis not present

## 2022-11-27 ENCOUNTER — Encounter (INDEPENDENT_AMBULATORY_CARE_PROVIDER_SITE_OTHER): Payer: Self-pay

## 2022-11-27 DIAGNOSIS — T24212D Burn of second degree of left thigh, subsequent encounter: Secondary | ICD-10-CM | POA: Diagnosis not present

## 2022-11-27 DIAGNOSIS — J449 Chronic obstructive pulmonary disease, unspecified: Secondary | ICD-10-CM | POA: Diagnosis not present

## 2022-11-27 DIAGNOSIS — L97521 Non-pressure chronic ulcer of other part of left foot limited to breakdown of skin: Secondary | ICD-10-CM | POA: Diagnosis not present

## 2022-11-27 DIAGNOSIS — L97421 Non-pressure chronic ulcer of left heel and midfoot limited to breakdown of skin: Secondary | ICD-10-CM | POA: Diagnosis not present

## 2022-11-27 DIAGNOSIS — E1140 Type 2 diabetes mellitus with diabetic neuropathy, unspecified: Secondary | ICD-10-CM | POA: Diagnosis not present

## 2022-11-27 DIAGNOSIS — T24212A Burn of second degree of left thigh, initial encounter: Secondary | ICD-10-CM | POA: Diagnosis not present

## 2022-11-27 DIAGNOSIS — E11621 Type 2 diabetes mellitus with foot ulcer: Secondary | ICD-10-CM | POA: Diagnosis not present

## 2022-11-27 DIAGNOSIS — T2121XD Burn of second degree of chest wall, subsequent encounter: Secondary | ICD-10-CM | POA: Diagnosis not present

## 2022-11-27 DIAGNOSIS — Z87891 Personal history of nicotine dependence: Secondary | ICD-10-CM | POA: Diagnosis not present

## 2022-11-27 DIAGNOSIS — L97411 Non-pressure chronic ulcer of right heel and midfoot limited to breakdown of skin: Secondary | ICD-10-CM | POA: Diagnosis not present

## 2022-12-01 ENCOUNTER — Other Ambulatory Visit (HOSPITAL_COMMUNITY): Payer: Self-pay | Admitting: Psychiatry

## 2022-12-02 DIAGNOSIS — I509 Heart failure, unspecified: Secondary | ICD-10-CM | POA: Diagnosis not present

## 2022-12-02 DIAGNOSIS — E11621 Type 2 diabetes mellitus with foot ulcer: Secondary | ICD-10-CM | POA: Diagnosis not present

## 2022-12-02 DIAGNOSIS — T24212D Burn of second degree of left thigh, subsequent encounter: Secondary | ICD-10-CM | POA: Diagnosis not present

## 2022-12-02 DIAGNOSIS — L97521 Non-pressure chronic ulcer of other part of left foot limited to breakdown of skin: Secondary | ICD-10-CM | POA: Diagnosis not present

## 2022-12-02 DIAGNOSIS — I110 Hypertensive heart disease with heart failure: Secondary | ICD-10-CM | POA: Diagnosis not present

## 2022-12-02 DIAGNOSIS — J449 Chronic obstructive pulmonary disease, unspecified: Secondary | ICD-10-CM | POA: Diagnosis not present

## 2022-12-03 ENCOUNTER — Other Ambulatory Visit: Payer: Self-pay | Admitting: Cardiology

## 2022-12-04 ENCOUNTER — Telehealth: Payer: Self-pay | Admitting: Internal Medicine

## 2022-12-04 DIAGNOSIS — T24212D Burn of second degree of left thigh, subsequent encounter: Secondary | ICD-10-CM | POA: Diagnosis not present

## 2022-12-04 DIAGNOSIS — I509 Heart failure, unspecified: Secondary | ICD-10-CM | POA: Diagnosis not present

## 2022-12-04 DIAGNOSIS — L97521 Non-pressure chronic ulcer of other part of left foot limited to breakdown of skin: Secondary | ICD-10-CM | POA: Diagnosis not present

## 2022-12-04 DIAGNOSIS — J449 Chronic obstructive pulmonary disease, unspecified: Secondary | ICD-10-CM | POA: Diagnosis not present

## 2022-12-04 DIAGNOSIS — E11621 Type 2 diabetes mellitus with foot ulcer: Secondary | ICD-10-CM | POA: Diagnosis not present

## 2022-12-04 DIAGNOSIS — I110 Hypertensive heart disease with heart failure: Secondary | ICD-10-CM | POA: Diagnosis not present

## 2022-12-04 NOTE — Telephone Encounter (Signed)
Returned pt call and LVM that [employer] is currently closed for applications. She can apply through [employer]. Informed pt that the [employer] form will be mailed to her and to call back to schedule an appt since she has not been seen in a year.

## 2022-12-04 NOTE — Telephone Encounter (Signed)
Pt c/o medication issue:  1. Name of Medication:   REPATHA SURECLICK 140 MG/ML SOAJ   2. How are you currently taking this medication (dosage and times per day)?   As prescribed  3. Are you having a reaction (difficulty breathing--STAT)?   No  4. What is your medication issue?   Patient stated she gets this medication from Dr. Rennis Golden and is calling to reapply to get this medication.

## 2022-12-09 DIAGNOSIS — L97521 Non-pressure chronic ulcer of other part of left foot limited to breakdown of skin: Secondary | ICD-10-CM | POA: Diagnosis not present

## 2022-12-09 DIAGNOSIS — E11621 Type 2 diabetes mellitus with foot ulcer: Secondary | ICD-10-CM | POA: Diagnosis not present

## 2022-12-09 DIAGNOSIS — I110 Hypertensive heart disease with heart failure: Secondary | ICD-10-CM | POA: Diagnosis not present

## 2022-12-09 DIAGNOSIS — J449 Chronic obstructive pulmonary disease, unspecified: Secondary | ICD-10-CM | POA: Diagnosis not present

## 2022-12-09 DIAGNOSIS — T24212D Burn of second degree of left thigh, subsequent encounter: Secondary | ICD-10-CM | POA: Diagnosis not present

## 2022-12-09 DIAGNOSIS — I509 Heart failure, unspecified: Secondary | ICD-10-CM | POA: Diagnosis not present

## 2022-12-10 ENCOUNTER — Telehealth: Payer: Self-pay | Admitting: Internal Medicine

## 2022-12-10 NOTE — Telephone Encounter (Signed)
Call goes straight to VM.  LM to call office

## 2022-12-10 NOTE — Telephone Encounter (Signed)
Pt c/o medication issue:  1. Name of Medication:   REPATHA SURECLICK 140 MG/ML SOAJ    2. How are you currently taking this medication (dosage and times per day)?    3. Are you having a reaction (difficulty breathing--STAT)? no  4. What is your medication issue? Patient states that she needs a form filled out, so that she can get her medication. Please advise

## 2022-12-11 DIAGNOSIS — J449 Chronic obstructive pulmonary disease, unspecified: Secondary | ICD-10-CM | POA: Diagnosis not present

## 2022-12-11 DIAGNOSIS — T24212D Burn of second degree of left thigh, subsequent encounter: Secondary | ICD-10-CM | POA: Diagnosis not present

## 2022-12-11 DIAGNOSIS — T2121XD Burn of second degree of chest wall, subsequent encounter: Secondary | ICD-10-CM | POA: Diagnosis not present

## 2022-12-11 DIAGNOSIS — L97421 Non-pressure chronic ulcer of left heel and midfoot limited to breakdown of skin: Secondary | ICD-10-CM | POA: Diagnosis not present

## 2022-12-11 DIAGNOSIS — E11621 Type 2 diabetes mellitus with foot ulcer: Secondary | ICD-10-CM | POA: Diagnosis not present

## 2022-12-11 DIAGNOSIS — Z87891 Personal history of nicotine dependence: Secondary | ICD-10-CM | POA: Diagnosis not present

## 2022-12-11 DIAGNOSIS — L97411 Non-pressure chronic ulcer of right heel and midfoot limited to breakdown of skin: Secondary | ICD-10-CM | POA: Diagnosis not present

## 2022-12-11 DIAGNOSIS — E1140 Type 2 diabetes mellitus with diabetic neuropathy, unspecified: Secondary | ICD-10-CM | POA: Diagnosis not present

## 2022-12-11 DIAGNOSIS — L97521 Non-pressure chronic ulcer of other part of left foot limited to breakdown of skin: Secondary | ICD-10-CM | POA: Diagnosis not present

## 2022-12-11 NOTE — Telephone Encounter (Signed)
Returned call to pt. Goes straight to VM. LVM to call the office.

## 2022-12-11 NOTE — Telephone Encounter (Signed)
LM for patient that [employer] grant is good until 08/2023 -- diagnosis verification is needed -- will upload.   MyChart message sent with grant info

## 2022-12-11 NOTE — Telephone Encounter (Signed)
GRANT Hypercholesterolemia - Medicare Access   STATUS Active   BALANCE $2130.30   START DATE 08/22/2022   END DATE 08/21/2023

## 2022-12-12 DIAGNOSIS — E1121 Type 2 diabetes mellitus with diabetic nephropathy: Secondary | ICD-10-CM | POA: Diagnosis not present

## 2022-12-12 DIAGNOSIS — Z79890 Hormone replacement therapy (postmenopausal): Secondary | ICD-10-CM | POA: Diagnosis not present

## 2022-12-12 DIAGNOSIS — Z794 Long term (current) use of insulin: Secondary | ICD-10-CM | POA: Diagnosis not present

## 2022-12-12 DIAGNOSIS — M65342 Trigger finger, left ring finger: Secondary | ICD-10-CM | POA: Diagnosis not present

## 2022-12-12 DIAGNOSIS — R2689 Other abnormalities of gait and mobility: Secondary | ICD-10-CM | POA: Insufficient documentation

## 2022-12-12 DIAGNOSIS — E039 Hypothyroidism, unspecified: Secondary | ICD-10-CM | POA: Diagnosis not present

## 2022-12-12 DIAGNOSIS — D509 Iron deficiency anemia, unspecified: Secondary | ICD-10-CM | POA: Diagnosis not present

## 2022-12-12 DIAGNOSIS — R2681 Unsteadiness on feet: Secondary | ICD-10-CM | POA: Diagnosis not present

## 2022-12-12 DIAGNOSIS — J302 Other seasonal allergic rhinitis: Secondary | ICD-10-CM | POA: Diagnosis not present

## 2022-12-12 DIAGNOSIS — Z7984 Long term (current) use of oral hypoglycemic drugs: Secondary | ICD-10-CM | POA: Diagnosis not present

## 2022-12-12 DIAGNOSIS — E119 Type 2 diabetes mellitus without complications: Secondary | ICD-10-CM | POA: Diagnosis not present

## 2022-12-12 DIAGNOSIS — M19042 Primary osteoarthritis, left hand: Secondary | ICD-10-CM | POA: Diagnosis not present

## 2022-12-13 DIAGNOSIS — L97521 Non-pressure chronic ulcer of other part of left foot limited to breakdown of skin: Secondary | ICD-10-CM | POA: Diagnosis not present

## 2022-12-13 DIAGNOSIS — T24212D Burn of second degree of left thigh, subsequent encounter: Secondary | ICD-10-CM | POA: Diagnosis not present

## 2022-12-13 DIAGNOSIS — I110 Hypertensive heart disease with heart failure: Secondary | ICD-10-CM | POA: Diagnosis not present

## 2022-12-13 DIAGNOSIS — J449 Chronic obstructive pulmonary disease, unspecified: Secondary | ICD-10-CM | POA: Diagnosis not present

## 2022-12-13 DIAGNOSIS — I509 Heart failure, unspecified: Secondary | ICD-10-CM | POA: Diagnosis not present

## 2022-12-13 DIAGNOSIS — E11621 Type 2 diabetes mellitus with foot ulcer: Secondary | ICD-10-CM | POA: Diagnosis not present

## 2022-12-15 DIAGNOSIS — Z7982 Long term (current) use of aspirin: Secondary | ICD-10-CM | POA: Diagnosis not present

## 2022-12-15 DIAGNOSIS — I110 Hypertensive heart disease with heart failure: Secondary | ICD-10-CM | POA: Diagnosis not present

## 2022-12-15 DIAGNOSIS — I509 Heart failure, unspecified: Secondary | ICD-10-CM | POA: Diagnosis not present

## 2022-12-15 DIAGNOSIS — T24212D Burn of second degree of left thigh, subsequent encounter: Secondary | ICD-10-CM | POA: Diagnosis not present

## 2022-12-15 DIAGNOSIS — M109 Gout, unspecified: Secondary | ICD-10-CM | POA: Diagnosis not present

## 2022-12-15 DIAGNOSIS — Z9981 Dependence on supplemental oxygen: Secondary | ICD-10-CM | POA: Diagnosis not present

## 2022-12-15 DIAGNOSIS — Z8673 Personal history of transient ischemic attack (TIA), and cerebral infarction without residual deficits: Secondary | ICD-10-CM | POA: Diagnosis not present

## 2022-12-15 DIAGNOSIS — E11621 Type 2 diabetes mellitus with foot ulcer: Secondary | ICD-10-CM | POA: Diagnosis not present

## 2022-12-15 DIAGNOSIS — Z7984 Long term (current) use of oral hypoglycemic drugs: Secondary | ICD-10-CM | POA: Diagnosis not present

## 2022-12-15 DIAGNOSIS — D1724 Benign lipomatous neoplasm of skin and subcutaneous tissue of left leg: Secondary | ICD-10-CM | POA: Diagnosis not present

## 2022-12-15 DIAGNOSIS — Z794 Long term (current) use of insulin: Secondary | ICD-10-CM | POA: Diagnosis not present

## 2022-12-15 DIAGNOSIS — L97521 Non-pressure chronic ulcer of other part of left foot limited to breakdown of skin: Secondary | ICD-10-CM | POA: Diagnosis not present

## 2022-12-15 DIAGNOSIS — E039 Hypothyroidism, unspecified: Secondary | ICD-10-CM | POA: Diagnosis not present

## 2022-12-15 DIAGNOSIS — Z604 Social exclusion and rejection: Secondary | ICD-10-CM | POA: Diagnosis not present

## 2022-12-15 DIAGNOSIS — Z87891 Personal history of nicotine dependence: Secondary | ICD-10-CM | POA: Diagnosis not present

## 2022-12-15 DIAGNOSIS — J449 Chronic obstructive pulmonary disease, unspecified: Secondary | ICD-10-CM | POA: Diagnosis not present

## 2022-12-15 DIAGNOSIS — E1140 Type 2 diabetes mellitus with diabetic neuropathy, unspecified: Secondary | ICD-10-CM | POA: Diagnosis not present

## 2022-12-18 ENCOUNTER — Ambulatory Visit (INDEPENDENT_AMBULATORY_CARE_PROVIDER_SITE_OTHER): Payer: Medicare Other | Admitting: Pulmonary Disease

## 2022-12-18 ENCOUNTER — Encounter: Payer: Self-pay | Admitting: Pulmonary Disease

## 2022-12-18 VITALS — BP 122/67 | HR 84 | Ht 60.0 in | Wt 261.0 lb

## 2022-12-18 DIAGNOSIS — M1990 Unspecified osteoarthritis, unspecified site: Secondary | ICD-10-CM | POA: Insufficient documentation

## 2022-12-18 DIAGNOSIS — I110 Hypertensive heart disease with heart failure: Secondary | ICD-10-CM | POA: Diagnosis not present

## 2022-12-18 DIAGNOSIS — I5032 Chronic diastolic (congestive) heart failure: Secondary | ICD-10-CM

## 2022-12-18 DIAGNOSIS — G4733 Obstructive sleep apnea (adult) (pediatric): Secondary | ICD-10-CM | POA: Diagnosis not present

## 2022-12-18 DIAGNOSIS — I10 Essential (primary) hypertension: Secondary | ICD-10-CM | POA: Insufficient documentation

## 2022-12-18 DIAGNOSIS — J449 Chronic obstructive pulmonary disease, unspecified: Secondary | ICD-10-CM | POA: Diagnosis not present

## 2022-12-18 DIAGNOSIS — L97521 Non-pressure chronic ulcer of other part of left foot limited to breakdown of skin: Secondary | ICD-10-CM | POA: Diagnosis not present

## 2022-12-18 DIAGNOSIS — E11621 Type 2 diabetes mellitus with foot ulcer: Secondary | ICD-10-CM | POA: Diagnosis not present

## 2022-12-18 DIAGNOSIS — T24212D Burn of second degree of left thigh, subsequent encounter: Secondary | ICD-10-CM | POA: Diagnosis not present

## 2022-12-18 DIAGNOSIS — I509 Heart failure, unspecified: Secondary | ICD-10-CM | POA: Diagnosis not present

## 2022-12-18 NOTE — Assessment & Plan Note (Signed)
She would be eligible for replacement auto CPAP 10 to 15 cm in January.  Prescription has been sent to DME  Weight loss encouraged, compliance with goal of at least 4-6 hrs every night is the expectation. Advised against medications with sedative side effects Cautioned against driving when sleepy - understanding that sleepiness will vary on a day to day basis

## 2022-12-18 NOTE — Assessment & Plan Note (Signed)
We discussed weight-based dosing of torsemide.  She only takes this on an as-needed basis if she takes this daily she is concerned about worsening her kidney function

## 2022-12-18 NOTE — Progress Notes (Signed)
   Subjective:    Patient ID: Norma [name], female    DOB: [birth year], 72 y.o.   MRN: [id number]  HPI  72 yo  minimal remote smoker for FU of nocturnal hypoxia and OSA, referred by neurology. She is a retired Engineer, civil (consulting), and reports diagnosis of OSA for more than 20 years maintained on CPAP.  DME is [facility] She was started on oxygen after sleep study in 2021- unclear cause of nocturnal hypoxia -lung function appears okay no evidence of ILD or pulm hypertension    PMH -diabetes, hypertension,  CKD stage IIIb,  spinal stenosis,  bilateral TKR,  CABG 2019,  HFpEF -Restless leg syndrome  4 month FU visit  Last OV 08/2022 >>Replacement auto CPAP 10-15 ordered but she is not due for replacement until January.  Her current CPAP has been obtained through charity She had increased shortness of breath and hypoxia O2 saturations of 91% -BNP was normal, D-dimer was less than 0.71 Echo 05/2022 showed normal LVEF and normal RVSP.  We  resumed torsemide , but this gets her dehydrated. BUN/creatinine increased from 45/1.6-38/2.3 in June.  She has follow-up with renal. She has limited her water intake to 1.5 L. Weight is stable at 261 pounds. Breathing is poor if she retains fluid   Significant tests/ events reviewed 04/2018 PFTs no airway obstruction with ratio 84, FEV1 1 to 2%, FVC 93%, TLC 100%, DLCO 72%.   06/2021 ONO on CPAP/room air >> desaturation for 4 hours ONO on CPAP/2 L oxygen showed desaturation for 54 minutes.  12/2020 ONO on CPAP/5 L oxygen showed no desaturation   09/2019 NPSG TST 340 minutes, AHI 9.2/hour, lowest desaturation 75%, desaturation less than 88% for 5 hours   10/2019 CPAP titration, 243 pounds, CPAP 14 cm with 2 L oxygen   CT chest 05/2016 no ILD    Review of Systems neg for any significant sore throat, dysphagia, itching, sneezing, nasal congestion or excess/ purulent secretions, fever, chills, sweats, unintended wt loss, pleuritic or exertional cp, hempoptysis,  orthopnea pnd or change in chronic leg swelling. Also denies presyncope, palpitations, heartburn, abdominal pain, nausea, vomiting, diarrhea or change in bowel or urinary habits, dysuria,hematuria, rash, arthralgias, visual complaints, headache, numbness weakness or ataxia.     Objective:   Physical Exam  Gen. Pleasant, obese, in no distress ENT - no lesions, no post nasal drip Neck: No JVD, no thyromegaly, no carotid bruits Lungs: no use of accessory muscles, no dullness to percussion, decreased without rales or rhonchi  Cardiovascular: Rhythm regular, heart sounds  normal, no murmurs or gallops, no peripheral edema Musculoskeletal: No deformities, no cyanosis or clubbing , no tremors        Assessment & Plan:

## 2022-12-18 NOTE — Patient Instructions (Signed)
Replacement CPAP Rx has been sent   We discussed weight based dosing of toresemide

## 2022-12-23 DIAGNOSIS — I110 Hypertensive heart disease with heart failure: Secondary | ICD-10-CM | POA: Diagnosis not present

## 2022-12-23 DIAGNOSIS — E11621 Type 2 diabetes mellitus with foot ulcer: Secondary | ICD-10-CM | POA: Diagnosis not present

## 2022-12-23 DIAGNOSIS — J449 Chronic obstructive pulmonary disease, unspecified: Secondary | ICD-10-CM | POA: Diagnosis not present

## 2022-12-23 DIAGNOSIS — T24212D Burn of second degree of left thigh, subsequent encounter: Secondary | ICD-10-CM | POA: Diagnosis not present

## 2022-12-23 DIAGNOSIS — L97521 Non-pressure chronic ulcer of other part of left foot limited to breakdown of skin: Secondary | ICD-10-CM | POA: Diagnosis not present

## 2022-12-23 DIAGNOSIS — I509 Heart failure, unspecified: Secondary | ICD-10-CM | POA: Diagnosis not present

## 2022-12-26 DIAGNOSIS — I110 Hypertensive heart disease with heart failure: Secondary | ICD-10-CM | POA: Diagnosis not present

## 2022-12-26 DIAGNOSIS — E11621 Type 2 diabetes mellitus with foot ulcer: Secondary | ICD-10-CM | POA: Diagnosis not present

## 2022-12-26 DIAGNOSIS — J449 Chronic obstructive pulmonary disease, unspecified: Secondary | ICD-10-CM | POA: Diagnosis not present

## 2022-12-26 DIAGNOSIS — I509 Heart failure, unspecified: Secondary | ICD-10-CM | POA: Diagnosis not present

## 2022-12-26 DIAGNOSIS — L97521 Non-pressure chronic ulcer of other part of left foot limited to breakdown of skin: Secondary | ICD-10-CM | POA: Diagnosis not present

## 2022-12-26 DIAGNOSIS — T24212D Burn of second degree of left thigh, subsequent encounter: Secondary | ICD-10-CM | POA: Diagnosis not present

## 2022-12-28 DIAGNOSIS — I110 Hypertensive heart disease with heart failure: Secondary | ICD-10-CM | POA: Diagnosis not present

## 2022-12-28 DIAGNOSIS — I509 Heart failure, unspecified: Secondary | ICD-10-CM | POA: Diagnosis not present

## 2022-12-28 DIAGNOSIS — L97521 Non-pressure chronic ulcer of other part of left foot limited to breakdown of skin: Secondary | ICD-10-CM | POA: Diagnosis not present

## 2022-12-28 DIAGNOSIS — T24212D Burn of second degree of left thigh, subsequent encounter: Secondary | ICD-10-CM | POA: Diagnosis not present

## 2022-12-28 DIAGNOSIS — E11621 Type 2 diabetes mellitus with foot ulcer: Secondary | ICD-10-CM | POA: Diagnosis not present

## 2022-12-28 DIAGNOSIS — J449 Chronic obstructive pulmonary disease, unspecified: Secondary | ICD-10-CM | POA: Diagnosis not present

## 2022-12-30 ENCOUNTER — Other Ambulatory Visit: Payer: Self-pay | Admitting: Cardiology

## 2022-12-30 ENCOUNTER — Telehealth: Payer: Self-pay | Admitting: Cardiology

## 2022-12-30 DIAGNOSIS — I509 Heart failure, unspecified: Secondary | ICD-10-CM | POA: Diagnosis not present

## 2022-12-30 DIAGNOSIS — I110 Hypertensive heart disease with heart failure: Secondary | ICD-10-CM | POA: Diagnosis not present

## 2022-12-30 DIAGNOSIS — J449 Chronic obstructive pulmonary disease, unspecified: Secondary | ICD-10-CM | POA: Diagnosis not present

## 2022-12-30 DIAGNOSIS — E11621 Type 2 diabetes mellitus with foot ulcer: Secondary | ICD-10-CM | POA: Diagnosis not present

## 2022-12-30 DIAGNOSIS — T24212D Burn of second degree of left thigh, subsequent encounter: Secondary | ICD-10-CM | POA: Diagnosis not present

## 2022-12-30 DIAGNOSIS — L97521 Non-pressure chronic ulcer of other part of left foot limited to breakdown of skin: Secondary | ICD-10-CM | POA: Diagnosis not present

## 2022-12-30 MED ORDER — CARVEDILOL 6.25 MG PO TABS: 6.2500 mg | ORAL_TABLET | Freq: Two times a day (BID) | ORAL | 2 refills | Status: DC

## 2022-12-30 NOTE — Telephone Encounter (Signed)
Medication filled.

## 2022-12-30 NOTE — Telephone Encounter (Signed)
Patient states she is returning a call from someone processing a refill request. She would like to inform them that her preferred pharmacy is now [facility], [address] [place], [place].

## 2023-01-01 DIAGNOSIS — J449 Chronic obstructive pulmonary disease, unspecified: Secondary | ICD-10-CM | POA: Diagnosis not present

## 2023-01-01 DIAGNOSIS — E11621 Type 2 diabetes mellitus with foot ulcer: Secondary | ICD-10-CM | POA: Diagnosis not present

## 2023-01-01 DIAGNOSIS — Z880 Allergy status to penicillin: Secondary | ICD-10-CM | POA: Diagnosis not present

## 2023-01-01 DIAGNOSIS — L97521 Non-pressure chronic ulcer of other part of left foot limited to breakdown of skin: Secondary | ICD-10-CM | POA: Diagnosis not present

## 2023-01-01 DIAGNOSIS — Z7982 Long term (current) use of aspirin: Secondary | ICD-10-CM | POA: Diagnosis not present

## 2023-01-01 DIAGNOSIS — I10 Essential (primary) hypertension: Secondary | ICD-10-CM | POA: Diagnosis not present

## 2023-01-01 DIAGNOSIS — E1140 Type 2 diabetes mellitus with diabetic neuropathy, unspecified: Secondary | ICD-10-CM | POA: Diagnosis not present

## 2023-01-01 DIAGNOSIS — Z791 Long term (current) use of non-steroidal anti-inflammatories (NSAID): Secondary | ICD-10-CM | POA: Diagnosis not present

## 2023-01-01 DIAGNOSIS — Z87891 Personal history of nicotine dependence: Secondary | ICD-10-CM | POA: Diagnosis not present

## 2023-01-03 DIAGNOSIS — I509 Heart failure, unspecified: Secondary | ICD-10-CM | POA: Diagnosis not present

## 2023-01-03 DIAGNOSIS — T24212D Burn of second degree of left thigh, subsequent encounter: Secondary | ICD-10-CM | POA: Diagnosis not present

## 2023-01-03 DIAGNOSIS — I110 Hypertensive heart disease with heart failure: Secondary | ICD-10-CM | POA: Diagnosis not present

## 2023-01-03 DIAGNOSIS — J449 Chronic obstructive pulmonary disease, unspecified: Secondary | ICD-10-CM | POA: Diagnosis not present

## 2023-01-03 DIAGNOSIS — L97521 Non-pressure chronic ulcer of other part of left foot limited to breakdown of skin: Secondary | ICD-10-CM | POA: Diagnosis not present

## 2023-01-03 DIAGNOSIS — E11621 Type 2 diabetes mellitus with foot ulcer: Secondary | ICD-10-CM | POA: Diagnosis not present

## 2023-01-06 DIAGNOSIS — J449 Chronic obstructive pulmonary disease, unspecified: Secondary | ICD-10-CM | POA: Diagnosis not present

## 2023-01-06 DIAGNOSIS — I110 Hypertensive heart disease with heart failure: Secondary | ICD-10-CM | POA: Diagnosis not present

## 2023-01-06 DIAGNOSIS — I509 Heart failure, unspecified: Secondary | ICD-10-CM | POA: Diagnosis not present

## 2023-01-06 DIAGNOSIS — L97521 Non-pressure chronic ulcer of other part of left foot limited to breakdown of skin: Secondary | ICD-10-CM | POA: Diagnosis not present

## 2023-01-06 DIAGNOSIS — E11621 Type 2 diabetes mellitus with foot ulcer: Secondary | ICD-10-CM | POA: Diagnosis not present

## 2023-01-06 DIAGNOSIS — T24212D Burn of second degree of left thigh, subsequent encounter: Secondary | ICD-10-CM | POA: Diagnosis not present

## 2023-01-07 DIAGNOSIS — E1142 Type 2 diabetes mellitus with diabetic polyneuropathy: Secondary | ICD-10-CM | POA: Diagnosis not present

## 2023-01-07 DIAGNOSIS — M79676 Pain in unspecified toe(s): Secondary | ICD-10-CM | POA: Diagnosis not present

## 2023-01-07 DIAGNOSIS — L84 Corns and callosities: Secondary | ICD-10-CM | POA: Diagnosis not present

## 2023-01-07 DIAGNOSIS — B351 Tinea unguium: Secondary | ICD-10-CM | POA: Diagnosis not present

## 2023-01-09 DIAGNOSIS — L97521 Non-pressure chronic ulcer of other part of left foot limited to breakdown of skin: Secondary | ICD-10-CM | POA: Diagnosis not present

## 2023-01-09 DIAGNOSIS — E11621 Type 2 diabetes mellitus with foot ulcer: Secondary | ICD-10-CM | POA: Diagnosis not present

## 2023-01-09 DIAGNOSIS — J449 Chronic obstructive pulmonary disease, unspecified: Secondary | ICD-10-CM | POA: Diagnosis not present

## 2023-01-09 DIAGNOSIS — I110 Hypertensive heart disease with heart failure: Secondary | ICD-10-CM | POA: Diagnosis not present

## 2023-01-09 DIAGNOSIS — I509 Heart failure, unspecified: Secondary | ICD-10-CM | POA: Diagnosis not present

## 2023-01-09 DIAGNOSIS — T24212D Burn of second degree of left thigh, subsequent encounter: Secondary | ICD-10-CM | POA: Diagnosis not present

## 2023-01-14 DIAGNOSIS — Z8673 Personal history of transient ischemic attack (TIA), and cerebral infarction without residual deficits: Secondary | ICD-10-CM | POA: Diagnosis not present

## 2023-01-14 DIAGNOSIS — Z7984 Long term (current) use of oral hypoglycemic drugs: Secondary | ICD-10-CM | POA: Diagnosis not present

## 2023-01-14 DIAGNOSIS — Z87891 Personal history of nicotine dependence: Secondary | ICD-10-CM | POA: Diagnosis not present

## 2023-01-14 DIAGNOSIS — I110 Hypertensive heart disease with heart failure: Secondary | ICD-10-CM | POA: Diagnosis not present

## 2023-01-14 DIAGNOSIS — E1140 Type 2 diabetes mellitus with diabetic neuropathy, unspecified: Secondary | ICD-10-CM | POA: Diagnosis not present

## 2023-01-14 DIAGNOSIS — Z794 Long term (current) use of insulin: Secondary | ICD-10-CM | POA: Diagnosis not present

## 2023-01-14 DIAGNOSIS — Z7982 Long term (current) use of aspirin: Secondary | ICD-10-CM | POA: Diagnosis not present

## 2023-01-14 DIAGNOSIS — D1724 Benign lipomatous neoplasm of skin and subcutaneous tissue of left leg: Secondary | ICD-10-CM | POA: Diagnosis not present

## 2023-01-14 DIAGNOSIS — Z604 Social exclusion and rejection: Secondary | ICD-10-CM | POA: Diagnosis not present

## 2023-01-14 DIAGNOSIS — I509 Heart failure, unspecified: Secondary | ICD-10-CM | POA: Diagnosis not present

## 2023-01-14 DIAGNOSIS — Z9981 Dependence on supplemental oxygen: Secondary | ICD-10-CM | POA: Diagnosis not present

## 2023-01-14 DIAGNOSIS — J449 Chronic obstructive pulmonary disease, unspecified: Secondary | ICD-10-CM | POA: Diagnosis not present

## 2023-01-14 DIAGNOSIS — M109 Gout, unspecified: Secondary | ICD-10-CM | POA: Diagnosis not present

## 2023-01-14 DIAGNOSIS — E039 Hypothyroidism, unspecified: Secondary | ICD-10-CM | POA: Diagnosis not present

## 2023-01-14 DIAGNOSIS — L97521 Non-pressure chronic ulcer of other part of left foot limited to breakdown of skin: Secondary | ICD-10-CM | POA: Diagnosis not present

## 2023-01-14 DIAGNOSIS — E11621 Type 2 diabetes mellitus with foot ulcer: Secondary | ICD-10-CM | POA: Diagnosis not present

## 2023-01-15 ENCOUNTER — Other Ambulatory Visit: Payer: Self-pay

## 2023-01-15 DIAGNOSIS — Z5989 Other problems related to housing and economic circumstances: Secondary | ICD-10-CM

## 2023-01-15 NOTE — Progress Notes (Addendum)
01/15/2023  Patient ID: Norma [name], female   DOB: [birth year], 71 y.o.   MRN: [id number]     2025 Medication Assistance Renewal Application Summary:  Patient was outreached by [facility] Team regarding medication assistance renewal for 2025. Verified address, anticipated insurance for 2025, and income has not changed. Patient remains interested in PAP for 2025 for Jardiance; please see below for other medications identified for medication assistance. [employer] Part D Medicare insurance.    Medication Review Findings:  Patient does not have medication assistance for Toujeo. Patient reported she pays $35/month for medication and she is interested in PAP for this medication.  She receives a Engineer, manufacturing with [employer] for Repatha and reports that it expires in February and March. However, per chart review note on 12/11/2022, the patient's grant expires 08/21/2023 and it was completed by cardiology team.  Rybelsus discontinued d/c intolerance endorsed and it no longer getting medication  Medication Assistance Findings:  Medication assistance needs identified: Volta Bing (confirmed patient qualifies with insurance) ; Repatha - grant     Additional medication assistance options reviewed with patient as warranted:  Foundation programs - patient reports that she knows how to do the grant re-enrollment. However, she may need follow up for re-enrollment.   Plan: I will route patient assistance letter to [facility] pharmacy technician who will coordinate patient assistance program application process for medications listed above.  [facility] pharmacy technician will assist with obtaining all required documents from both patient and provider(s) and submit application(s) once completed.    Thank you for allowing pharmacy to be a part of this patient's care.   Cephus Shelling, PharmD Clinical Pharmacist Cell: [phone]

## 2023-01-16 ENCOUNTER — Other Ambulatory Visit: Payer: Self-pay | Admitting: Internal Medicine

## 2023-01-16 ENCOUNTER — Other Ambulatory Visit: Payer: Self-pay | Admitting: Orthopedic Surgery

## 2023-01-16 ENCOUNTER — Telehealth: Payer: Self-pay

## 2023-01-16 ENCOUNTER — Encounter: Payer: Self-pay | Admitting: Orthopedic Surgery

## 2023-01-16 ENCOUNTER — Other Ambulatory Visit (INDEPENDENT_AMBULATORY_CARE_PROVIDER_SITE_OTHER): Payer: Self-pay

## 2023-01-16 ENCOUNTER — Ambulatory Visit (INDEPENDENT_AMBULATORY_CARE_PROVIDER_SITE_OTHER): Payer: Medicare Other | Admitting: Orthopedic Surgery

## 2023-01-16 VITALS — BP 160/79 | Ht 60.0 in | Wt 261.0 lb

## 2023-01-16 DIAGNOSIS — E875 Hyperkalemia: Secondary | ICD-10-CM | POA: Diagnosis not present

## 2023-01-16 DIAGNOSIS — G8929 Other chronic pain: Secondary | ICD-10-CM

## 2023-01-16 DIAGNOSIS — E1129 Type 2 diabetes mellitus with other diabetic kidney complication: Secondary | ICD-10-CM | POA: Diagnosis not present

## 2023-01-16 DIAGNOSIS — E11621 Type 2 diabetes mellitus with foot ulcer: Secondary | ICD-10-CM | POA: Diagnosis not present

## 2023-01-16 DIAGNOSIS — I509 Heart failure, unspecified: Secondary | ICD-10-CM | POA: Diagnosis not present

## 2023-01-16 DIAGNOSIS — I110 Hypertensive heart disease with heart failure: Secondary | ICD-10-CM | POA: Diagnosis not present

## 2023-01-16 DIAGNOSIS — Z794 Long term (current) use of insulin: Secondary | ICD-10-CM | POA: Diagnosis not present

## 2023-01-16 DIAGNOSIS — R809 Proteinuria, unspecified: Secondary | ICD-10-CM | POA: Diagnosis not present

## 2023-01-16 DIAGNOSIS — E1122 Type 2 diabetes mellitus with diabetic chronic kidney disease: Secondary | ICD-10-CM | POA: Diagnosis not present

## 2023-01-16 DIAGNOSIS — N189 Chronic kidney disease, unspecified: Secondary | ICD-10-CM | POA: Diagnosis not present

## 2023-01-16 DIAGNOSIS — M25562 Pain in left knee: Secondary | ICD-10-CM | POA: Diagnosis not present

## 2023-01-16 DIAGNOSIS — J449 Chronic obstructive pulmonary disease, unspecified: Secondary | ICD-10-CM | POA: Diagnosis not present

## 2023-01-16 DIAGNOSIS — E782 Mixed hyperlipidemia: Secondary | ICD-10-CM

## 2023-01-16 DIAGNOSIS — I25110 Atherosclerotic heart disease of native coronary artery with unstable angina pectoris: Secondary | ICD-10-CM

## 2023-01-16 DIAGNOSIS — M1712 Unilateral primary osteoarthritis, left knee: Secondary | ICD-10-CM | POA: Diagnosis not present

## 2023-01-16 DIAGNOSIS — L97521 Non-pressure chronic ulcer of other part of left foot limited to breakdown of skin: Secondary | ICD-10-CM | POA: Diagnosis not present

## 2023-01-16 DIAGNOSIS — N170 Acute kidney failure with tubular necrosis: Secondary | ICD-10-CM | POA: Diagnosis not present

## 2023-01-16 NOTE — Progress Notes (Signed)
BP (!) 160/79 Comment: patient advised to see primary care for this, she is also short of breath.  Ht 5' (1.524 m)   Wt 261 lb (118.4 kg)   BMI 50.97 kg/m   Chief Complaint  Patient presents with   Knee Problem    Left/ knee swollen and painful     72 year old history of bilateral knee pain no history of trauma progressively worsening medial knee pain swelling  Not a surgical candidate because medical problems BMI of 50   Exam left knee  Chronic flexion contracture about 10 degrees.  Flexion arc is from 10 to 115 degrees  Possible joint effusion  Tenderness medial femoral condyle  X-ray:Impression valgus angle 10 degrees extensive lateral compartment OA  All I can do at this point is injection   She asked about "gel" shots  Its worth a try   Procedure note left knee injection   verbal consent was obtained to inject left knee joint  Timeout was completed to confirm the site of injection  The medications used were depomedrol 40 mg and 1% lidocaine 3 cc Anesthesia was provided by ethyl chloride and the skin was prepped with alcohol.  After cleaning the skin with alcohol a 20-gauge needle was used to inject the left knee joint. There were no complications. A sterile bandage was applied.    We will inquire           Patient Active Problem List   Diagnosis Date Noted   Arthritis 12/18/2022   COPD (chronic obstructive pulmonary disease) (HCC) 12/18/2022   Hypertension 12/18/2022   Diabetic ulcer of toe of left foot associated with type 2 diabetes mellitus, limited to breakdown of skin (HCC) 11/13/2022   DOE (dyspnea on exertion) 09/10/2022   Osteopenia 06/13/2022   Iron deficiency 12/25/2021   Abdominal pain, epigastric 11/12/2021   History of back surgery 07/13/2021   Dysphagia 05/22/2021   Leukocytosis 04/20/2021   Spinal stenosis of lumbar region 02/12/2021   Stage 3b chronic kidney disease (HCC) 11/29/2020   History of diabetes mellitus 11/29/2020    Chronic diastolic heart failure (HCC) 11/29/2020   Idiopathic urticaria 11/29/2020   IDA (iron deficiency anemia) 11/29/2020   Systolic heart failure (HCC) 11/14/2020   Disorder of kidney 11/14/2020   Myeloproliferative disorder (HCC) 05/22/2020   Thrombocytosis 05/22/2020   Body mass index (BMI) 45.0-49.9, adult (HCC) 05/17/2020   S/P ORIF (open reduction internal fixation) fracture left index finger 04/10/20 04/26/2020   Open displaced fracture of proximal phalanx of left index finger    Cellulitis of left hand    Dog bite, hand, left, initial encounter 04/09/2020   Dog bite 04/09/2020   Hypoxemia 09/23/2019   Sleep related hypoxia 09/23/2019   Idiopathic hypersomnia with long sleep time 08/16/2019   Insomnia due to other mental disorder 08/16/2019   Dry mouth not due to sicca syndrome 08/16/2019   Chronic coughing 08/16/2019   Unilateral primary osteoarthritis, right knee 02/18/2019   Bilateral primary osteoarthritis of knee 01/20/2019   Type 2 diabetes mellitus with diabetic neuropathy, unspecified (HCC) 09/16/2018   Unilateral primary osteoarthritis, left knee 09/16/2018   Class 3 severe obesity due to excess calories with serious comorbidity and body mass index (BMI) of 45.0 to 49.9 in adult (HCC) 02/20/2018   Severe obesity (HCC) 02/20/2018   Mixed hyperlipidemia 09/11/2017   Pressure injury of skin 09/02/2017   Coronary artery disease involving native heart without angina pectoris    S/P CABG x 3  Acute blood loss anemia    Bipolar affective disorder (HCC)    History of CVA (cerebrovascular accident)    Hx of CABG 08/29/2017   Coronary artery disease involving native coronary artery of native heart with unstable angina pectoris (HCC) 08/28/2017   Unstable angina (HCC) 08/27/2017   Cardiomyopathy (HCC) 08/22/2017   Cerebrovascular accident (HCC) 05/16/2017   Anemia 09/19/2016   Dizziness 06/21/2016   Chest pain with moderate risk for cardiac etiology 04/23/2016    Bipolar I disorder, most recent episode depressed (HCC) 06/05/2015   DM type 2 causing vascular disease (HCC) 09/20/2014   Chronic bilateral low back pain with bilateral sciatica 09/20/2014   Morbid obesity (HCC) 09/20/2014   Memory difficulty 09/20/2014   Type 2 diabetes mellitus (HCC) 09/20/2014   Chronic low back pain 09/20/2014   Lumbosacral spondylosis without myelopathy 12/25/2012   Restless legs syndrome (RLS) 09/17/2012   Headache 03/19/2012   Gastroesophageal reflux disease 02/16/2012   Hypothyroidism 02/16/2012   Tremor 02/16/2012   Hyperlipidemia associated with type 2 diabetes mellitus (HCC) 04/01/2008   Obstructive sleep apnea syndrome 04/01/2008   Benign essential hypertension 04/01/2008   Left bundle-branch block 04/01/2008

## 2023-01-16 NOTE — Patient Instructions (Addendum)
We will check which brand of Hyaluronic acid injections (there are several) your insurance covers. We will call you with price and schedule with you if insurance approves and you are okay with the out of pocket costs. If for any reason they will not cover or the out of pocket costs are high, we will discuss with you and let you know other options available. This process normally takes several weeks to hear back from us the insurance approval process takes time.   

## 2023-01-16 NOTE — Telephone Encounter (Signed)
VOB submitted for Orthovisc, left knee.  

## 2023-01-20 DIAGNOSIS — I110 Hypertensive heart disease with heart failure: Secondary | ICD-10-CM | POA: Diagnosis not present

## 2023-01-20 DIAGNOSIS — Z794 Long term (current) use of insulin: Secondary | ICD-10-CM | POA: Diagnosis not present

## 2023-01-20 DIAGNOSIS — L97521 Non-pressure chronic ulcer of other part of left foot limited to breakdown of skin: Secondary | ICD-10-CM | POA: Diagnosis not present

## 2023-01-20 DIAGNOSIS — E11621 Type 2 diabetes mellitus with foot ulcer: Secondary | ICD-10-CM | POA: Diagnosis not present

## 2023-01-20 DIAGNOSIS — I509 Heart failure, unspecified: Secondary | ICD-10-CM | POA: Diagnosis not present

## 2023-01-20 DIAGNOSIS — J449 Chronic obstructive pulmonary disease, unspecified: Secondary | ICD-10-CM | POA: Diagnosis not present

## 2023-01-20 MED ORDER — METHYLPREDNISOLONE ACETATE 40 MG/ML IJ SUSP
40.0000 mg | Freq: Once | INTRAMUSCULAR | Status: AC
Start: 2023-01-20 — End: 2023-01-16
  Administered 2023-01-16: 40 mg via INTRA_ARTICULAR

## 2023-01-20 NOTE — Addendum Note (Signed)
Addended byCaffie Damme on: 01/20/2023 02:13 PM   Modules accepted: Orders

## 2023-01-22 DIAGNOSIS — L97521 Non-pressure chronic ulcer of other part of left foot limited to breakdown of skin: Secondary | ICD-10-CM | POA: Diagnosis not present

## 2023-01-22 DIAGNOSIS — I110 Hypertensive heart disease with heart failure: Secondary | ICD-10-CM | POA: Diagnosis not present

## 2023-01-22 DIAGNOSIS — Z880 Allergy status to penicillin: Secondary | ICD-10-CM | POA: Diagnosis not present

## 2023-01-22 DIAGNOSIS — E11621 Type 2 diabetes mellitus with foot ulcer: Secondary | ICD-10-CM | POA: Diagnosis not present

## 2023-01-22 DIAGNOSIS — I509 Heart failure, unspecified: Secondary | ICD-10-CM | POA: Diagnosis not present

## 2023-01-22 DIAGNOSIS — Z79899 Other long term (current) drug therapy: Secondary | ICD-10-CM | POA: Diagnosis not present

## 2023-01-22 DIAGNOSIS — Z87891 Personal history of nicotine dependence: Secondary | ICD-10-CM | POA: Diagnosis not present

## 2023-01-22 DIAGNOSIS — J449 Chronic obstructive pulmonary disease, unspecified: Secondary | ICD-10-CM | POA: Diagnosis not present

## 2023-01-22 DIAGNOSIS — Z7984 Long term (current) use of oral hypoglycemic drugs: Secondary | ICD-10-CM | POA: Diagnosis not present

## 2023-01-24 DIAGNOSIS — L97521 Non-pressure chronic ulcer of other part of left foot limited to breakdown of skin: Secondary | ICD-10-CM | POA: Diagnosis not present

## 2023-01-24 DIAGNOSIS — I509 Heart failure, unspecified: Secondary | ICD-10-CM | POA: Diagnosis not present

## 2023-01-24 DIAGNOSIS — E11621 Type 2 diabetes mellitus with foot ulcer: Secondary | ICD-10-CM | POA: Diagnosis not present

## 2023-01-24 DIAGNOSIS — Z794 Long term (current) use of insulin: Secondary | ICD-10-CM | POA: Diagnosis not present

## 2023-01-24 DIAGNOSIS — J449 Chronic obstructive pulmonary disease, unspecified: Secondary | ICD-10-CM | POA: Diagnosis not present

## 2023-01-24 DIAGNOSIS — I110 Hypertensive heart disease with heart failure: Secondary | ICD-10-CM | POA: Diagnosis not present

## 2023-01-27 DIAGNOSIS — J449 Chronic obstructive pulmonary disease, unspecified: Secondary | ICD-10-CM | POA: Diagnosis not present

## 2023-01-27 DIAGNOSIS — I509 Heart failure, unspecified: Secondary | ICD-10-CM | POA: Diagnosis not present

## 2023-01-27 DIAGNOSIS — L97521 Non-pressure chronic ulcer of other part of left foot limited to breakdown of skin: Secondary | ICD-10-CM | POA: Diagnosis not present

## 2023-01-27 DIAGNOSIS — R809 Proteinuria, unspecified: Secondary | ICD-10-CM | POA: Diagnosis not present

## 2023-01-27 DIAGNOSIS — E1122 Type 2 diabetes mellitus with diabetic chronic kidney disease: Secondary | ICD-10-CM | POA: Diagnosis not present

## 2023-01-27 DIAGNOSIS — E1129 Type 2 diabetes mellitus with other diabetic kidney complication: Secondary | ICD-10-CM | POA: Diagnosis not present

## 2023-01-27 DIAGNOSIS — N189 Chronic kidney disease, unspecified: Secondary | ICD-10-CM | POA: Diagnosis not present

## 2023-01-27 DIAGNOSIS — I110 Hypertensive heart disease with heart failure: Secondary | ICD-10-CM | POA: Diagnosis not present

## 2023-01-27 DIAGNOSIS — Z794 Long term (current) use of insulin: Secondary | ICD-10-CM | POA: Diagnosis not present

## 2023-01-27 DIAGNOSIS — I5042 Chronic combined systolic (congestive) and diastolic (congestive) heart failure: Secondary | ICD-10-CM | POA: Diagnosis not present

## 2023-01-27 DIAGNOSIS — E11621 Type 2 diabetes mellitus with foot ulcer: Secondary | ICD-10-CM | POA: Diagnosis not present

## 2023-01-28 ENCOUNTER — Ambulatory Visit: Payer: Self-pay | Admitting: *Deleted

## 2023-01-28 ENCOUNTER — Other Ambulatory Visit (HOSPITAL_COMMUNITY): Payer: Self-pay | Admitting: Psychiatry

## 2023-01-29 DIAGNOSIS — E11621 Type 2 diabetes mellitus with foot ulcer: Secondary | ICD-10-CM | POA: Diagnosis not present

## 2023-01-29 DIAGNOSIS — L84 Corns and callosities: Secondary | ICD-10-CM | POA: Diagnosis not present

## 2023-01-29 DIAGNOSIS — Z880 Allergy status to penicillin: Secondary | ICD-10-CM | POA: Diagnosis not present

## 2023-01-29 DIAGNOSIS — L97521 Non-pressure chronic ulcer of other part of left foot limited to breakdown of skin: Secondary | ICD-10-CM | POA: Diagnosis not present

## 2023-01-29 DIAGNOSIS — I110 Hypertensive heart disease with heart failure: Secondary | ICD-10-CM | POA: Diagnosis not present

## 2023-01-29 DIAGNOSIS — I509 Heart failure, unspecified: Secondary | ICD-10-CM | POA: Diagnosis not present

## 2023-01-29 DIAGNOSIS — J449 Chronic obstructive pulmonary disease, unspecified: Secondary | ICD-10-CM | POA: Diagnosis not present

## 2023-01-30 ENCOUNTER — Telehealth: Payer: Self-pay | Admitting: Pharmacy Technician

## 2023-01-30 DIAGNOSIS — Z5986 Financial insecurity: Secondary | ICD-10-CM

## 2023-01-30 NOTE — Progress Notes (Signed)
[facility] Customer service manager [facility])                                            [facility] Quality Pharmacy Team    01/30/2023  Norma [name] 05-Apr-1951 [id number]                                      Medication Assistance Referral  Referral From:  Cephus Shelling, PharmD  Medication/Company: [name] Patient application portion:  Mailed Provider application portion: Faxed  to Dr. Nita Sells Provider address/fax verified via: Office website  Medication/Company: [name] Patient application portion:  Mailed Provider application portion: Faxed  to Dr. Nita Sells Provider address/fax verified via: Office website  [name], CPhT [place]  Office: [phone] Fax: [phone] Email: [name]

## 2023-01-31 ENCOUNTER — Telehealth: Payer: Self-pay

## 2023-01-31 DIAGNOSIS — L97521 Non-pressure chronic ulcer of other part of left foot limited to breakdown of skin: Secondary | ICD-10-CM | POA: Diagnosis not present

## 2023-01-31 DIAGNOSIS — I110 Hypertensive heart disease with heart failure: Secondary | ICD-10-CM | POA: Diagnosis not present

## 2023-01-31 DIAGNOSIS — J449 Chronic obstructive pulmonary disease, unspecified: Secondary | ICD-10-CM | POA: Diagnosis not present

## 2023-01-31 DIAGNOSIS — I509 Heart failure, unspecified: Secondary | ICD-10-CM | POA: Diagnosis not present

## 2023-01-31 DIAGNOSIS — Z794 Long term (current) use of insulin: Secondary | ICD-10-CM | POA: Diagnosis not present

## 2023-01-31 DIAGNOSIS — E11621 Type 2 diabetes mellitus with foot ulcer: Secondary | ICD-10-CM | POA: Diagnosis not present

## 2023-01-31 NOTE — Telephone Encounter (Signed)
Please schedule patient 3 appts.with Dr. Romeo Apple for gel injection.  Thank you.  All gel information has been added to referral.

## 2023-02-03 ENCOUNTER — Encounter: Payer: Self-pay | Admitting: Nurse Practitioner

## 2023-02-03 ENCOUNTER — Ambulatory Visit: Payer: Medicare Other | Attending: Nurse Practitioner | Admitting: Nurse Practitioner

## 2023-02-03 ENCOUNTER — Telehealth: Payer: Self-pay | Admitting: Orthopedic Surgery

## 2023-02-03 VITALS — BP 126/60 | HR 98 | Ht 60.0 in | Wt 263.8 lb

## 2023-02-03 DIAGNOSIS — I2510 Atherosclerotic heart disease of native coronary artery without angina pectoris: Secondary | ICD-10-CM

## 2023-02-03 DIAGNOSIS — E785 Hyperlipidemia, unspecified: Secondary | ICD-10-CM

## 2023-02-03 DIAGNOSIS — E11621 Type 2 diabetes mellitus with foot ulcer: Secondary | ICD-10-CM | POA: Diagnosis not present

## 2023-02-03 DIAGNOSIS — N1832 Chronic kidney disease, stage 3b: Secondary | ICD-10-CM | POA: Diagnosis not present

## 2023-02-03 DIAGNOSIS — Z23 Encounter for immunization: Secondary | ICD-10-CM | POA: Diagnosis not present

## 2023-02-03 DIAGNOSIS — R0609 Other forms of dyspnea: Secondary | ICD-10-CM

## 2023-02-03 DIAGNOSIS — J449 Chronic obstructive pulmonary disease, unspecified: Secondary | ICD-10-CM

## 2023-02-03 DIAGNOSIS — I5032 Chronic diastolic (congestive) heart failure: Secondary | ICD-10-CM

## 2023-02-03 DIAGNOSIS — I6523 Occlusion and stenosis of bilateral carotid arteries: Secondary | ICD-10-CM | POA: Diagnosis not present

## 2023-02-03 DIAGNOSIS — I509 Heart failure, unspecified: Secondary | ICD-10-CM | POA: Diagnosis not present

## 2023-02-03 DIAGNOSIS — L97521 Non-pressure chronic ulcer of other part of left foot limited to breakdown of skin: Secondary | ICD-10-CM | POA: Diagnosis not present

## 2023-02-03 DIAGNOSIS — G4733 Obstructive sleep apnea (adult) (pediatric): Secondary | ICD-10-CM | POA: Diagnosis not present

## 2023-02-03 DIAGNOSIS — Z794 Long term (current) use of insulin: Secondary | ICD-10-CM | POA: Diagnosis not present

## 2023-02-03 DIAGNOSIS — I110 Hypertensive heart disease with heart failure: Secondary | ICD-10-CM | POA: Diagnosis not present

## 2023-02-03 DIAGNOSIS — E1159 Type 2 diabetes mellitus with other circulatory complications: Secondary | ICD-10-CM

## 2023-02-03 NOTE — Telephone Encounter (Signed)
LVM for the patient to call me back to schedule her Orthovisc injections for her lt knee, no copay

## 2023-02-03 NOTE — Progress Notes (Unsigned)
Cardiology Office Note:    Date: 10/03/2022  ID:  Norma [name], DOB March 24, 1951, MRN [id number]  PCP:  [name], MD   [facility] Providers Cardiologist:  Dina Rich, MD     Referring MD: [name], MD   CC: Here for follow-up  History of Present Illness:    Norma [name] is a 72 y.o. female with a hx of the following:  CAD, s/p CABG Hyperlipidemia Carotid artery stenosis Chronic diastolic CHF Chronic left bundle branch block Type 2 diabetes CKD stage IIIb GERD OSA on CPAP COPD  Patient is a very pleasant 72 year old female with past medical history as mentioned above.  Underwent cardiac catheterization in 2019 for unstable angina.  Findings revealed D1 90%, LAD 95%, and RPDA 80%, as well as OM 90%.  Underwent CABG with sequential SVG to diag and LAD, SVG-OM.  Echocardiogram in 2022 showed normal EF.  TTE 05/2022 showed normal EF, no RWMA, normal PASP, mildly calcified AV, no significant valvular abnormalities.  Today she states she is doing well and doing better since I last saw her. She is breathing better and states she has lost weight.  Denies any chest pain, palpitations, syncope, presyncope, dizziness, orthopnea, PND, swelling or significant weight changes, acute bleeding, or claudication. Does admit to improved DOE.   Past Medical History:  Diagnosis Date   Anemia    Anxiety    Bipolar disorder (HCC)    Bulging lumbar disc    L3-4   Chronic daily headache    Chronic low back pain 09/20/2014   CKD (chronic kidney disease)    stage 3   COPD (chronic obstructive pulmonary disease) (HCC)    Coronary artery disease    CABG 2019   Degenerative arthritis    Depression    Diabetes mellitus, type II (HCC)    Diabetic neuropathy (HCC) 09/16/2018   DM type 2 with diabetic peripheral neuropathy (HCC) 09/20/2014   Dyslipidemia    Dyspnea    Family history of adverse reaction to anesthesia    father had reaction to anesthesia medication  per patient "they don't use it anymore"- unsure of reaction   Fatty liver    per pt report   Gastroparesis    GERD (gastroesophageal reflux disease)    Heart murmur    History of hiatal hernia    pt states she no longer has this   Hypertension    Hypothyroidism    IBS (irritable bowel syndrome)    LBBB (left bundle branch block)    Memory difficulty 09/20/2014   Morbid obesity (HCC)    Myocardial infarction (HCC)    per pt- prior to CABG   Neuropathy    Obstructive sleep apnea on CPAP    Renal insufficiency    Restless legs syndrome (RLS) 09/17/2012   Stroke (cerebrum) (HCC) 05/16/2017   Left parietal   Urticaria     Past Surgical History:  Procedure Laterality Date   ABDOMINAL HYSTERECTOMY     ARTHROSCOPY KNEE W/ DRILLING  06/2011   and Decemer of 2013.   Carpel tunnel  1980's   CATARACT EXTRACTION W/PHACO Right 03/21/2017   Procedure: CATARACT EXTRACTION PHACO AND INTRAOCULAR LENS PLACEMENT RIGHT EYE;  Surgeon: Fabio Pierce, MD;  Location: [facility];  Service: Ophthalmology;  Laterality: Right;  CDE: 2.91    CATARACT EXTRACTION W/PHACO Left 04/18/2017   Procedure: CATARACT EXTRACTION PHACO AND INTRAOCULAR LENS PLACEMENT LEFT EYE;  Surgeon: Fabio Pierce, MD;  Location: [facility];  Service: Ophthalmology;  Laterality: Left;  left   CHOLECYSTECTOMY     COLONOSCOPY WITH PROPOFOL N/A 10/04/2016   Procedure: COLONOSCOPY WITH PROPOFOL;  Surgeon: Malissa Hippo, MD;  Location: [facility];  Service: Endoscopy;  Laterality: N/A;  11:10   CORONARY ARTERY BYPASS GRAFT N/A 08/29/2017   Procedure: CORONARY ARTERY BYPASS GRAFTING (CABG) x 3 WITH ENDOSCOPIC HARVESTING OF RIGHT SAPHENOUS VEIN;  Surgeon: Kerin Perna, MD;  Location: [facility] OR;  Service: Open Heart Surgery;  Laterality: N/A;   ESOPHAGOGASTRODUODENOSCOPY N/A 04/25/2016   Procedure: ESOPHAGOGASTRODUODENOSCOPY (EGD);  Surgeon: Malissa Hippo, MD;  Location: [facility];  Service: Endoscopy;  Laterality: N/A;  730   LEFT  HEART CATH AND CORONARY ANGIOGRAPHY N/A 08/27/2017   Procedure: LEFT HEART CATH AND CORONARY ANGIOGRAPHY;  Surgeon: Tonny Bollman, MD;  Location: [facility] INVASIVE CV LAB;  Service: Cardiovascular::  pLAD 95% - p-mLAD 50%, ostD1 90% -pD1 80%; ostOM1 90%; rPDA 80%. EF ~50-55% - HK of dital Anterolateral & Apical wall.  - Rec CVTS c/s   NECK SURGERY     pt reports having growth removed from the back of her neck- after 2021   OPEN REDUCTION INTERNAL FIXATION (ORIF) HAND Left 04/10/2020   Procedure: OPEN REDUCTION INTERNAL FIXATION (ORIF) HAND, left index finger;  Surgeon: Vickki Hearing, MD;  Location: [facility];  Service: Orthopedics;  Laterality: Left;  0.045 k wires   RECTAL SURGERY     fissure   SHOULDER SURGERY Left    arthroscopy in March of this year   TEE WITHOUT CARDIOVERSION N/A 08/29/2017   Procedure: TRANSESOPHAGEAL ECHOCARDIOGRAM (TEE);  Surgeon: Donata Clay, Theron Arista, MD;  Location: [facility] OR;  Service: Open Heart Surgery;  Laterality: N/A;   TRANSFORAMINAL LUMBAR INTERBODY FUSION W/ MIS 1 LEVEL Right 07/12/2021   Procedure: Minimally Invasive Surgery Transforaminal Lumbar Interbody Fusion  Lumbar four-five;  Surgeon: Bedelia Person, MD;  Location: [facility] OR;  Service: Neurosurgery;  Laterality: Right;   TRANSTHORACIC ECHOCARDIOGRAM  06/06/2017   Mild to moderate reduced EF 40 and 45%.  Anterior septal, inferoseptal and basal to mid inferior hypokinesis.  GR 1 DD.  No significant valvular lesion    Current Medications: Current Meds  Medication Sig   Acetaminophen (TYLENOL ARTHRITIS EXT RELIEF PO)    albuterol (VENTOLIN HFA) 108 (90 Base) MCG/ACT inhaler Inhale into the lungs every 6 (six) hours as needed for wheezing or shortness of breath.   ALPRAZolam (XANAX) 0.25 MG tablet Take 1 tablet (0.25 mg total) by mouth at bedtime as needed for anxiety or sleep.   ARIPiprazole (ABILIFY) 5 MG tablet Take 1 tablet (5 mg total) by mouth daily.   aspirin EC 81 MG tablet Take 1 tablet (81 mg total) by  mouth daily.   azelastine (ASTELIN) 0.1 % nasal spray Place into both nostrils 2 (two) times daily. Use in each nostril as directed   buPROPion (WELLBUTRIN XL) 150 MG 24 hr tablet Take 1 tablet (150 mg total) by mouth every morning.   carvedilol (COREG) 6.25 MG tablet Take 1 tablet (6.25 mg total) by mouth 2 (two) times daily with a meal.   diclofenac Sodium (VOLTAREN) 1 % GEL Apply 2 g topically 4 (four) times daily as needed (pain).   diphenhydrAMINE (BENADRYL) 25 mg capsule Take 25 mg by mouth as needed for allergies or itching.   empagliflozin (JARDIANCE) 10 MG TABS tablet Take 1 tablet (10 mg total) by mouth daily.   EPINEPHrine 0.3 mg/0.3 mL IJ SOAJ injection  Inject 0.3 mg into the muscle once.   ferrous sulfate 325 (65 FE) MG EC tablet Take 325 mg by mouth. One daily   gabapentin (NEURONTIN) 400 MG capsule Take 400 mg by mouth in the morning. & 800 mg at bedtime   glipiZIDE (GLUCOTROL XL) 5 MG 24 hr tablet Take 1 tablet (5 mg total) by mouth daily with breakfast.   insulin glargine, 1 Unit Dial, (TOUJEO SOLOSTAR) 300 UNIT/ML Solostar Pen Inject 40 Units into the skin at bedtime.   lamoTRIgine (LAMICTAL) 150 MG tablet Take 1 tablet (150 mg total) by mouth at bedtime.   levothyroxine (SYNTHROID) 112 MCG tablet Take 112 mcg by mouth daily before breakfast.   losartan (COZAAR) 50 MG tablet Take 1 tablet (50 mg total) by mouth every morning.   meclizine (ANTIVERT) 25 MG tablet Take 1 tablet (25 mg total) by mouth 3 (three) times daily as needed for dizziness. (Patient taking differently: Take 25 mg by mouth daily as needed for dizziness.)   mupirocin ointment (BACTROBAN) 2 %    ondansetron (ZOFRAN) 4 MG tablet Take 1 tablet (4 mg total) by mouth every 8 (eight) hours as needed for nausea or vomiting.   OVER THE COUNTER MEDICATION daily as needed (pain). CBD ointment/roll on   pantoprazole (PROTONIX) 40 MG tablet Take 1 tablet by mouth daily before supper.   REPATHA SURECLICK 140 MG/ML SOAJ  INJECT ONE DOSE INTO SKIN EVERY 14 DAYS.   rOPINIRole (REQUIP) 3 MG tablet TAKE ONE TABLET BY MOUTH EVERY MORNING, ,NOON AND AT BEDTIME. (PRT PT MORNING,EVENING,BEDTIME) (Patient taking differently: Take 3 mg by mouth 3 (three) times daily.)   tiZANidine (ZANAFLEX) 2 MG tablet Take 2 mg by mouth 2 (two) times daily.   venlafaxine XR (EFFEXOR-XR) 150 MG 24 hr capsule TAKE TWO (2) CAPSULES BY MOUTH AT BEDTIME   Vitamin D, Ergocalciferol, (DRISDOL) 1.25 MG (50000 UNIT) CAPS capsule Take 50,000 Units by mouth every 7 (seven) days. Sunday   torsemide (DEMADEX) 10 MG tablet Take 2 tablets (20 mg total) by mouth daily.     Allergies:   Amoxicillin, Hydrocodone, Percocet [oxycodone-acetaminophen], Depacon [valproate sodium], Depacon [valproic acid], Oxycodone, Oxycodone hcl, and Prednisone   Social History   Socioeconomic History   Marital status: Single    Spouse name: Not on file   Number of children: 0   Years of education: 16   Highest education level: Not on file  Occupational History    Employer: [employer]  Tobacco Use   Smoking status: Former    Current packs/day: 0.00    Types: Cigarettes    Quit date: 02/04/1971    Years since quitting: 52.0   Smokeless tobacco: Never   Tobacco comments:    smoked 2 cigarettes a day  Vaping Use   Vaping status: Never Used  Substance and Sexual Activity   Alcohol use: No    Alcohol/week: 0.0 standard drinks of alcohol   Drug use: No   Sexual activity: Never  Other Topics Concern   Not on file  Social History Narrative   Patient lives at home alone.    Patient has no children.    Patient has her masters in nursing.    Patient is single.    Patient drinks about 2 glasses of tea daily.   Patient is right handed.   Social Determinants of Health   Financial Resource Strain: Medium Risk (12/18/2021)   Overall Financial Resource Strain (CARDIA)    Difficulty of Paying Living  Expenses: Somewhat hard  Food Insecurity: No Food  Insecurity (12/18/2021)   Hunger Vital Sign    Worried About Running Out of Food in the Last Year: Never true    Ran Out of Food in the Last Year: Never true  Transportation Needs: No Transportation Needs (12/18/2021)   PRAPARE - Administrator, Civil Service (Medical): No    Lack of Transportation (Non-Medical): No  Physical Activity: Not on file  Stress: No Stress Concern Present (10/04/2021)   [employer] of Occupational Health - Occupational Stress Questionnaire    Feeling of Stress : Only a little  Social Connections: Not on file     Family History: The patient's family history includes Alcohol abuse in her brother, father, paternal grandfather, paternal grandmother, and paternal uncle; Arthritis in her father; Cancer in her brother; Depression in her mother; Hypertension in her mother and sister; Lymphoma in her mother. There is no history of Allergic rhinitis, Angioedema, Asthma, Atopy, Eczema, Immunodeficiency, or Urticaria.  ROS:     Please see the history of present illness.    All other systems reviewed and are negative.  EKGs/Labs/Other Studies Reviewed:    The following studies were reviewed today:   EKG:  EKG is not ordered today.   Echo on 06/11/2022:   1. Left ventricular ejection fraction, by estimation, is 55 to 60%. The  left ventricle has normal function. The left ventricle has no regional  wall motion abnormalities. Left ventricular diastolic parameters were  normal. The average left ventricular  global longitudinal strain is -23.2 %. The global longitudinal strain is  normal.   2. Right ventricular systolic function is normal. The right ventricular  size is normal. There is normal pulmonary artery systolic pressure. The  estimated right ventricular systolic pressure is 28.4 mmHg.   3. The mitral valve is grossly normal. Trivial mitral valve  regurgitation.   4. The aortic valve is tricuspid. There is mild calcification of the  aortic valve.  Aortic valve regurgitation is not visualized. Aortic valve  sclerosis/calcification is present, without any evidence of aortic  stenosis.   5. The inferior vena cava is normal in size with greater than 50%  respiratory variability, suggesting right atrial pressure of 3 mmHg.   Comparison(s): Prior images reviewed side by side. LVEF normal range at  55-60%, septal motion consistent with LBBB.   Echocardiogram on 10/18/2020:   1. Markedly abnormal septal motion ? from BBB. Left ventricular ejection  fraction, by estimation, is 50 to 55%. The left ventricle has low normal  function. The left ventricle has no regional wall motion abnormalities.  Left ventricular diastolic  parameters were normal.   2. Right ventricular systolic function is normal. The right ventricular  size is normal.   3. The mitral valve is normal in structure. Trivial mitral valve  regurgitation. No evidence of mitral stenosis.   4. The aortic valve is tricuspid. There is mild calcification of the  aortic valve. Aortic valve regurgitation is not visualized. Mild aortic  valve sclerosis is present, with no evidence of aortic valve stenosis.   5. The inferior vena cava is normal in size with greater than 50%  respiratory variability, suggesting right atrial pressure of 3 mmHg.   Comparison(s): Echocardiogram done 02/03/18 showed an EF of 50-55%.  TEE on 08/30/2017: Aortic valve: The valve is trileaflet. No stenosis. No regurgitation.   Mitral valve: Mild regurgitation. Central jet.   Right ventricle: Normal cavity size, wall thickness and ejection  fraction.   Pericardium: Trivial pericardial effusion.   Tricuspid valve: No regurgitation   Pulmonic valve: No regurgitation by color doppler.   Left ventricle: Regional wall motion abnormalities present, dyskinetic  basal and apical septal wall, hypokinesis of lateral wall and inferoseptal  wall. Increased wall thickness. LVEF 35-40%.   No ASD/PFO present    Unable to rule out thrombus in LAA but velocities make thrombus less  likely.    Post Bypass:   Tricuspid, Pulmonic, Mitral and Aortic valve unchanged. LVEF > 40% with  vasopressor support (CO > 3.2). RWMA's present but slightly improved. No  dissection present after aortic cannula removed.   Left heart cath on 08/27/2017: RPDA lesion is 80% stenosed. Ost 1st Mrg to 1st Mrg lesion is 90% stenosed. Prox LAD lesion is 95% stenosed. Prox LAD to Mid LAD lesion is 50% stenosed. Ost 1st Diag lesion is 90% stenosed. 1st Diag lesion is 80% stenosed. The left ventricular ejection fraction is 50-55% by visual estimate. There is mild left ventricular systolic dysfunction.   1.  Severe multivessel coronary artery disease with severe stenosis of the proximal LAD/first diagonal bifurcation, first obtuse marginal branch of the circumflex, and PDA branch of the RCA. 2.  Mild segmental LV systolic dysfunction with hypokinesis of the distal anterolateral and apical walls, LVEF estimated at 50 to 55%.   Recommendations: The patient has critical multivessel coronary artery disease with symptoms of unstable angina.  Considering her history of long-standing diabetes and multivessel disease involving the LAD/diagonal bifurcation, will consult cardiac surgery for consideration of CABG.  If she is felt to be a poor candidate for CABG because of morbid obesity and other comorbid medical problems, PCI would be a reasonable alternative.  If she requires treatment with PCI, I would treat the LAD/diagonal bifurcation and the severe stenosis in the large first OM branch.  Would likely treat her PDA lesion medically considering its relatively distal location in that vessel.  Myoview on 04/12/2016: Probable normal perfusion and mild soft tissue attenuation (breast) No significant ischemia or evidence for scar The left ventricular ejection fraction is normal (55-65%). Low risk scan.  Recent Labs: 09/10/2022: BNP  26.9 10/09/2022: ALT 13; BUN 38; Creatinine 2.3; Potassium 4.3; Sodium 136; TSH 1.73  Recent Lipid Panel    Component Value Date/Time   CHOL 139 10/09/2022 0000   TRIG 123 10/09/2022 0000   HDL 55 10/09/2022 0000   CHOLHDL 3.8 03/05/2019 1221   VLDL 31 (H) 07/10/2016 0934   LDLCALC 62 10/09/2022 0000   LDLCALC 152 (H) 03/05/2019 1221    Physical Exam:    VS:  There were no vitals taken for this visit.    Wt Readings from Last 3 Encounters:  01/16/23 261 lb (118.4 kg)  12/18/22 261 lb (118.4 kg)  11/07/22 251 lb (113.9 kg)     GEN: Morbidly obese, 72 y.o. female in no acute distress HEENT: Normal NECK: No JVD; R carotid bruit, no L carotid bruit CARDIAC: S1/S2, RRR, no murmurs, rubs, gallops; 2+ pulses RESPIRATORY:  Clear to auscultation without rales, wheezing or rhonchi  MUSCULOSKELETAL:  No edema; No deformity  SKIN: Warm and dry NEUROLOGIC:  Alert and oriented x 3 PSYCHIATRIC:  Normal affect   ASSESSMENT:    No diagnosis found.  PLAN:    In order of problems listed above:  Chronic diastolic CHF, DOE Stage C, NYHA class I-II symptoms. Breathing stable/improved. Echo 06/2022 showed normal EF. Euvolemic and well compensated on exam. Continue current medication regimen,  unable to increase GDMT with current BP. Low sodium diet, fluid restriction <2L, and daily weights encouraged. Educated to contact our office for weight gain of 2 lbs overnight or 5 lbs in one week. Heart healthy diet and regular cardiovascular exercise encouraged.   CAD, s/p CABG Stable with no anginal symptoms. No indication for ischemic evaluation.   She is s/p CABG in 2019. Continue ASA, carvedilol, losartan, and repatha. Heart healthy diet and regular cardiovascular exercise encouraged. Previously recommended Tylenol 1,000 mg BID for pain relief. ED precautions discussed.   Carotid artery stenosis, HLD 1-39% bilateral stenosis seen on carotid duplex 07/2022. Previous lipid panel revealed LDL at 46.  She is at goal. Continue current medication regimen. Heart healthy diet and regular cardiovascular exercise encouraged.   CKD stage 3b Stable kidney function 09/2022. Avoid nephrotoxic agents. She follows Dr. Wolfgang Phoenix (Nephrology). Continue to follow with PCP.  5. OSA on CPAP Encouraged continued compliance with CPAP. Encouraged her to continue to follow-up with Dr. [name].  6. Morbid obesity BMI today 50.31. Weight loss via diet and exercise encouraged. Discussed the impact being overweight would have on cardiovascular risk.   8. Disposition: Follow-up with me or [facility] in 4-6 months or sooner if anything changes.    Medication Adjustments/Labs and Tests Ordered: Current medicines are reviewed at length with the patient today.  Concerns regarding medicines are outlined above.  No orders of the defined types were placed in this encounter.  No orders of the defined types were placed in this encounter.   There are no Patient Instructions on file for this visit.   Signed, Sharlene Dory, NP  02/03/2023 8:34 AM    [facility]

## 2023-02-03 NOTE — Patient Instructions (Addendum)

## 2023-02-04 ENCOUNTER — Ambulatory Visit (INDEPENDENT_AMBULATORY_CARE_PROVIDER_SITE_OTHER): Payer: Medicare Other | Admitting: "Endocrinology

## 2023-02-04 ENCOUNTER — Encounter: Payer: Self-pay | Admitting: "Endocrinology

## 2023-02-04 VITALS — BP 126/64 | HR 80 | Ht 60.0 in | Wt 263.8 lb

## 2023-02-04 DIAGNOSIS — E1159 Type 2 diabetes mellitus with other circulatory complications: Secondary | ICD-10-CM

## 2023-02-04 DIAGNOSIS — E785 Hyperlipidemia, unspecified: Secondary | ICD-10-CM

## 2023-02-04 DIAGNOSIS — E1169 Type 2 diabetes mellitus with other specified complication: Secondary | ICD-10-CM

## 2023-02-04 DIAGNOSIS — E559 Vitamin D deficiency, unspecified: Secondary | ICD-10-CM | POA: Diagnosis not present

## 2023-02-04 DIAGNOSIS — E038 Other specified hypothyroidism: Secondary | ICD-10-CM | POA: Diagnosis not present

## 2023-02-04 DIAGNOSIS — I10 Essential (primary) hypertension: Secondary | ICD-10-CM | POA: Diagnosis not present

## 2023-02-04 DIAGNOSIS — Z794 Long term (current) use of insulin: Secondary | ICD-10-CM | POA: Diagnosis not present

## 2023-02-04 LAB — POCT GLYCOSYLATED HEMOGLOBIN (HGB A1C): HbA1c, POC (controlled diabetic range): 7.8 % — AB (ref 0.0–7.0)

## 2023-02-04 MED ORDER — TOUJEO SOLOSTAR 300 UNIT/ML ~~LOC~~ SOPN: 50.0000 [IU] | PEN_INJECTOR | Freq: Every day | SUBCUTANEOUS | 1 refills | Status: DC

## 2023-02-04 MED ORDER — VITAMIN D3 50 MCG (2000 UT) PO CAPS: 2000.0000 [IU] | ORAL_CAPSULE | Freq: Every day | ORAL | 3 refills | Status: DC

## 2023-02-04 NOTE — Telephone Encounter (Signed)
Noted! Thank you

## 2023-02-04 NOTE — Progress Notes (Signed)
02/04/2023, 10:56 AM   Endocrinology follow-up note  Subjective:    Patient ID: Norma [name], female    DOB: Oct 23, 1950.  Norma [name] is being seen in follow-up after she was seen in consultation for management of currently uncontrolled symptomatic diabetes requested by  [name], MD.   Past Medical History:  Diagnosis Date   Anemia    Anxiety    Bipolar disorder (HCC)    Bulging lumbar disc    L3-4   Chronic daily headache    Chronic low back pain 09/20/2014   CKD (chronic kidney disease)    stage 3   COPD (chronic obstructive pulmonary disease) (HCC)    Coronary artery disease    CABG 2019   Degenerative arthritis    Depression    Diabetes mellitus, type II (HCC)    Diabetic neuropathy (HCC) 09/16/2018   DM type 2 with diabetic peripheral neuropathy (HCC) 09/20/2014   Dyslipidemia    Dyspnea    Family history of adverse reaction to anesthesia    father had reaction to anesthesia medication per patient "they don't use it anymore"- unsure of reaction   Fatty liver    per pt report   Gastroparesis    GERD (gastroesophageal reflux disease)    Heart murmur    History of hiatal hernia    pt states she no longer has this   Hypertension    Hypothyroidism    IBS (irritable bowel syndrome)    LBBB (left bundle branch block)    Memory difficulty 09/20/2014   Morbid obesity (HCC)    Myocardial infarction (HCC)    per pt- prior to CABG   Neuropathy    Obstructive sleep apnea on CPAP    Renal insufficiency    Restless legs syndrome (RLS) 09/17/2012   Stroke (cerebrum) (HCC) 05/16/2017   Left parietal   Urticaria     Past Surgical History:  Procedure Laterality Date   ABDOMINAL HYSTERECTOMY     ARTHROSCOPY KNEE W/ DRILLING  06/2011   and Decemer of 2013.   Carpel tunnel  1980's   CATARACT EXTRACTION W/PHACO Right 03/21/2017   Procedure: CATARACT EXTRACTION PHACO AND  INTRAOCULAR LENS PLACEMENT RIGHT EYE;  Surgeon: Fabio Pierce, MD;  Location: [facility];  Service: Ophthalmology;  Laterality: Right;  CDE: 2.91    CATARACT EXTRACTION W/PHACO Left 04/18/2017   Procedure: CATARACT EXTRACTION PHACO AND INTRAOCULAR LENS PLACEMENT LEFT EYE;  Surgeon: Fabio Pierce, MD;  Location: [facility];  Service: Ophthalmology;  Laterality: Left;  left   CHOLECYSTECTOMY     COLONOSCOPY WITH PROPOFOL N/A 10/04/2016   Procedure: COLONOSCOPY WITH PROPOFOL;  Surgeon: Malissa Hippo, MD;  Location: [facility];  Service: Endoscopy;  Laterality: N/A;  11:10   CORONARY ARTERY BYPASS GRAFT N/A 08/29/2017   Procedure: CORONARY ARTERY BYPASS GRAFTING (CABG) x 3 WITH ENDOSCOPIC HARVESTING OF RIGHT SAPHENOUS VEIN;  Surgeon: Kerin Perna, MD;  Location: [facility] OR;  Service: Open Heart Surgery;  Laterality: N/A;   ESOPHAGOGASTRODUODENOSCOPY N/A 04/25/2016   Procedure: ESOPHAGOGASTRODUODENOSCOPY (EGD);  Surgeon: Malissa Hippo, MD;  Location:  [facility];  Service: Endoscopy;  Laterality: N/A;  730   LEFT HEART CATH AND CORONARY ANGIOGRAPHY N/A 08/27/2017   Procedure: LEFT HEART CATH AND CORONARY ANGIOGRAPHY;  Surgeon: Tonny Bollman, MD;  Location: [facility] INVASIVE CV LAB;  Service: Cardiovascular::  pLAD 95% - p-mLAD 50%, ostD1 90% -pD1 80%; ostOM1 90%; rPDA 80%. EF ~50-55% - HK of dital Anterolateral & Apical wall.  - Rec CVTS c/s   NECK SURGERY     pt reports having growth removed from the back of her neck- after 2021   OPEN REDUCTION INTERNAL FIXATION (ORIF) HAND Left 04/10/2020   Procedure: OPEN REDUCTION INTERNAL FIXATION (ORIF) HAND, left index finger;  Surgeon: Vickki Hearing, MD;  Location: [facility];  Service: Orthopedics;  Laterality: Left;  0.045 k wires   RECTAL SURGERY     fissure   SHOULDER SURGERY Left    arthroscopy in March of this year   TEE WITHOUT CARDIOVERSION N/A 08/29/2017   Procedure: TRANSESOPHAGEAL ECHOCARDIOGRAM (TEE);  Surgeon: Donata Clay, Theron Arista, MD;  Location:  [facility] OR;  Service: Open Heart Surgery;  Laterality: N/A;   TRANSFORAMINAL LUMBAR INTERBODY FUSION W/ MIS 1 LEVEL Right 07/12/2021   Procedure: Minimally Invasive Surgery Transforaminal Lumbar Interbody Fusion  Lumbar four-five;  Surgeon: Bedelia Person, MD;  Location: [facility] OR;  Service: Neurosurgery;  Laterality: Right;   TRANSTHORACIC ECHOCARDIOGRAM  06/06/2017   Mild to moderate reduced EF 40 and 45%.  Anterior septal, inferoseptal and basal to mid inferior hypokinesis.  GR 1 DD.  No significant valvular lesion    Social History   Socioeconomic History   Marital status: Single    Spouse name: Not on file   Number of children: 0   Years of education: 16   Highest education level: Not on file  Occupational History    Employer: [employer]  Tobacco Use   Smoking status: Former    Current packs/day: 0.00    Types: Cigarettes    Quit date: 02/04/1971    Years since quitting: 52.0   Smokeless tobacco: Never   Tobacco comments:    smoked 2 cigarettes a day  Vaping Use   Vaping status: Never Used  Substance and Sexual Activity   Alcohol use: No    Alcohol/week: 0.0 standard drinks of alcohol   Drug use: No   Sexual activity: Never  Other Topics Concern   Not on file  Social History Narrative   Patient lives at home alone.    Patient has no children.    Patient has her masters in nursing.    Patient is single.    Patient drinks about 2 glasses of tea daily.   Patient is right handed.   Social Determinants of Health   Financial Resource Strain: Medium Risk (12/18/2021)   Overall Financial Resource Strain (CARDIA)    Difficulty of Paying Living Expenses: Somewhat hard  Food Insecurity: No Food Insecurity (12/18/2021)   Hunger Vital Sign    Worried About Running Out of Food in the Last Year: Never true    Ran Out of Food in the Last Year: Never true  Transportation Needs: No Transportation Needs (12/18/2021)   PRAPARE - Administrator, Civil Service  (Medical): No    Lack of Transportation (Non-Medical): No  Physical Activity: Not on file  Stress: No Stress Concern Present (10/04/2021)   [employer] of Occupational Health - Occupational Stress Questionnaire    Feeling of Stress : Only a little  Social Connections: Not on file    Family History  Problem Relation Age of Onset   Hypertension Mother    Lymphoma Mother    Depression Mother    Arthritis Father    Alcohol abuse Father    Hypertension Sister    Cancer Brother        kidney and lung   Alcohol abuse Brother    Alcohol abuse Paternal Uncle    Alcohol abuse Paternal Grandfather    Alcohol abuse Paternal Grandmother    Allergic rhinitis Neg Hx    Angioedema Neg Hx    Asthma Neg Hx    Atopy Neg Hx    Eczema Neg Hx    Immunodeficiency Neg Hx    Urticaria Neg Hx     Outpatient Encounter Medications as of 02/04/2023  Medication Sig   Acetaminophen (TYLENOL ARTHRITIS EXT RELIEF PO)    albuterol (VENTOLIN HFA) 108 (90 Base) MCG/ACT inhaler Inhale into the lungs every 6 (six) hours as needed for wheezing or shortness of breath.   ALPRAZolam (XANAX) 0.25 MG tablet Take 1 tablet (0.25 mg total) by mouth at bedtime as needed for anxiety or sleep.   ARIPiprazole (ABILIFY) 5 MG tablet Take 1 tablet (5 mg total) by mouth daily.   aspirin EC 81 MG tablet Take 1 tablet (81 mg total) by mouth daily.   azelastine (ASTELIN) 0.1 % nasal spray Place into both nostrils 2 (two) times daily. Use in each nostril as directed   buPROPion (WELLBUTRIN XL) 150 MG 24 hr tablet TAKE 1 TABLET BY MOUTH EVERY MORNING   carvedilol (COREG) 6.25 MG tablet Take 1 tablet (6.25 mg total) by mouth 2 (two) times daily with a meal.   diclofenac Sodium (VOLTAREN) 1 % GEL Apply 2 g topically 4 (four) times daily as needed (pain).   diphenhydrAMINE (BENADRYL) 25 mg capsule Take 25 mg by mouth as needed for allergies or itching.   EPINEPHrine 0.3 mg/0.3 mL IJ SOAJ injection Inject 0.3 mg into the muscle  once.   Evolocumab (REPATHA SURECLICK) 140 MG/ML SOAJ INJECT 1 DOSE UNDER THE SKIN EVERY 14 DAYS   ferrous sulfate 325 (65 FE) MG EC tablet Take 325 mg by mouth. One daily   gabapentin (NEURONTIN) 400 MG capsule Take 400 mg by mouth in the morning. & 800 mg at bedtime   glipiZIDE (GLUCOTROL XL) 5 MG 24 hr tablet Take 1 tablet (5 mg total) by mouth daily with breakfast.   insulin glargine, 1 Unit Dial, (TOUJEO SOLOSTAR) 300 UNIT/ML Solostar Pen Inject 44 Units into the skin at bedtime.   lamoTRIgine (LAMICTAL) 150 MG tablet Take 1 tablet (150 mg total) by mouth at bedtime.   levocetirizine (XYZAL) 5 MG tablet Take 5 mg by mouth daily.   levothyroxine (SYNTHROID) 112 MCG tablet Take 112 mcg by mouth daily before breakfast.   losartan (COZAAR) 25 MG tablet Take 25 mg by mouth daily.   meclizine (ANTIVERT) 25 MG tablet Take 1 tablet (25 mg total) by mouth 3 (three) times daily as needed for dizziness. (Patient taking differently: Take 25 mg by mouth daily as needed for dizziness.)   mupirocin ointment (BACTROBAN) 2 %    ondansetron (ZOFRAN) 4 MG tablet Take 1 tablet (4 mg total) by mouth every 8 (eight) hours as needed for nausea or vomiting.   OVER THE COUNTER MEDICATION daily as needed (pain). CBD ointment/roll on   pantoprazole (PROTONIX) 40 MG tablet Take 1 tablet by mouth daily before supper.  rOPINIRole (REQUIP) 3 MG tablet TAKE ONE TABLET BY MOUTH EVERY MORNING, ,NOON AND AT BEDTIME. (PRT PT MORNING,EVENING,BEDTIME) (Patient taking differently: Take 3 mg by mouth 3 (three) times daily.)   tiZANidine (ZANAFLEX) 2 MG tablet Take 2 mg by mouth 2 (two) times daily.   torsemide (DEMADEX) 20 MG tablet Take 40 mg by mouth daily.   venlafaxine XR (EFFEXOR-XR) 150 MG 24 hr capsule TAKE TWO (2) CAPSULES BY MOUTH AT BEDTIME   [DISCONTINUED] Vitamin D, Ergocalciferol, (DRISDOL) 1.25 MG (50000 UNIT) CAPS capsule Take 50,000 Units by mouth every 7 (seven) days. Sunday   No facility-administered encounter  medications on file as of 02/04/2023.    ALLERGIES: Allergies  Allergen Reactions   Amoxicillin Anaphylaxis and Other (See Comments)    Has patient had a PCN reaction causing immediate rash, facial/tongue/throat swelling, SOB or lightheadedness with hypotension: Yes Has patient had a PCN reaction causing severe rash involving mucus membranes or skin necrosis: Yes Has patient had a PCN reaction that required hospitalization: Yes Has patient had a PCN reaction occurring within the last 10 years: Yes If all of the above answers are "NO", then may proceed with Cephalosporin use.    Hydrocodone Anaphylaxis   Percocet [Oxycodone-Acetaminophen] Other (See Comments)    Causes chest pain /tightness. Pressure pain around her ribs.   Depacon [Valproate Sodium]    Depacon [Valproic Acid] Other (See Comments)    Causes falls    Oxycodone    Oxycodone Hcl    Prednisone Itching    VACCINATION STATUS: Immunization History  Administered Date(s) Administered    Astrazeneca Covid-19 Vaccine, Pf, 0.5 Ml Non [place]  01/29/2023   Influenza,inj,Quad PF,6+ Mos 01/20/2018   Influenza-Unspecified 01/29/2023   Janssen (J&J) SARS-COV-2 Vaccination 03/17/2020   Pneumococcal Polysaccharide-23 01/30/2015   Td,absorbed, Preservative Free, Adult Use, Lf Unspecified 07/31/1999   Tdap 07/31/1999, 04/08/2020    Diabetes She presents for her follow-up diabetic visit. She has type 2 diabetes mellitus. Onset time: She was diagnosed at approximate age of 47 years.  This patient was previously seen in this clinic for diabetic management, last visit was in 2021. Her disease course has been improving. There are no hypoglycemic associated symptoms. Pertinent negatives for hypoglycemia include no confusion, headaches, pallor or seizures. Associated symptoms include fatigue. Pertinent negatives for diabetes include no blurred vision, no chest pain, no polydipsia, no polyphagia and no polyuria. There are no hypoglycemic  complications. Symptoms are improving. Diabetic complications include heart disease and nephropathy. Risk factors for coronary artery disease include dyslipidemia, diabetes mellitus, hypertension, obesity, family history, post-menopausal, sedentary lifestyle and tobacco exposure. Current diabetic treatment includes insulin injections. Her weight is increasing steadily. She is following a generally unhealthy diet. When asked about meal planning, she reported none. She has had a previous visit with a dietitian. She rarely participates in exercise. Her home blood glucose trend is decreasing steadily. Her breakfast blood glucose range is generally 140-180 mg/dl. Her lunch blood glucose range is generally 140-180 mg/dl. Her dinner blood glucose range is generally 140-180 mg/dl. Her bedtime blood glucose range is generally 140-180 mg/dl. Her overall blood glucose range is 140-180 mg/dl. (She presents with improved glycemic profile.  Her AGP report shows 55% time range, 41% level 1 hyperglycemia, 4% able to hyperglycemia.  Her average blood glucose is 175 mg per DL over the last 14 days.  Her point-of-care A1c is 7.8% improving from 9.6%.   ) An ACE inhibitor/angiotensin II receptor blocker is being taken. Eye exam is current.  Hypertension This is a chronic problem. Pertinent negatives include no blurred vision, chest pain, headaches, palpitations or shortness of breath. Risk factors for coronary artery disease include dyslipidemia, diabetes mellitus, family history, obesity, post-menopausal state, smoking/tobacco exposure and sedentary lifestyle. Past treatments include angiotensin blockers. Hypertensive end-organ damage includes CAD/MI. Identifiable causes of hypertension include chronic renal disease.    Review of Systems  Constitutional:  Positive for fatigue. Negative for chills, fever and unexpected weight change.  HENT:  Negative for trouble swallowing and voice change.   Eyes:  Negative for blurred vision  and visual disturbance.  Respiratory:  Negative for cough, shortness of breath and wheezing.   Cardiovascular:  Negative for chest pain, palpitations and leg swelling.  Gastrointestinal:  Negative for diarrhea, nausea and vomiting.  Endocrine: Negative for cold intolerance, heat intolerance, polydipsia, polyphagia and polyuria.  Musculoskeletal:  Negative for arthralgias and myalgias.  Skin:  Negative for color change, pallor, rash and wound.  Neurological:  Negative for seizures and headaches.  Psychiatric/Behavioral:  Negative for confusion and suicidal ideas.     Objective:       02/04/2023   10:37 AM 02/03/2023   11:10 AM 01/16/2023   10:24 AM  Vitals with BMI  Height 5\' 0"  5\' 0"  5\' 0"   Weight 263 lbs 13 oz 263 lbs 13 oz 261 lbs  BMI 51.52 51.52 50.97  Systolic 126 126 161  Diastolic 64 60 79  Pulse 80 98     BP 126/64   Pulse 80   Ht 5' (1.524 m)   Wt 263 lb 12.8 oz (119.7 kg)   BMI 51.52 kg/m   Wt Readings from Last 3 Encounters:  02/04/23 263 lb 12.8 oz (119.7 kg)  02/03/23 263 lb 12.8 oz (119.7 kg)  01/16/23 261 lb (118.4 kg)       CMP ( most recent) CMP     Component Value Date/Time   NA 136 (A) 10/09/2022 0000   K 4.3 10/09/2022 0000   CL 95 (A) 10/09/2022 0000   CO2 24 (A) 10/09/2022 0000   GLUCOSE 249 (H) 09/10/2022 1036   GLUCOSE 171 (H) 07/13/2021 0230   BUN 38 (A) 10/09/2022 0000   CREATININE 2.3 (A) 10/09/2022 0000   CREATININE 1.64 (H) 09/10/2022 1036   CREATININE 1.10 (H) 03/05/2019 1221   CALCIUM 9.4 10/09/2022 0000   PROT 7.1 07/05/2021 1400   PROT 6.9 01/11/2021 1014   ALBUMIN 3.9 10/09/2022 0000   ALBUMIN 4.1 01/11/2021 1014   AST 13 10/09/2022 0000   ALT 13 10/09/2022 0000   ALKPHOS 82 10/09/2022 0000   BILITOT 0.6 07/05/2021 1400   BILITOT 0.2 01/11/2021 1014   GFRNONAA 23 10/09/2022 0000   GFRNONAA 53 (L) 03/05/2019 1221   GFRAA 56 (L) 04/23/2019 1511   GFRAA 61 03/05/2019 1221     Diabetic Labs (most recent): Lab  Results  Component Value Date   HGBA1C 9.6 10/09/2022   HGBA1C 7.6 06/03/2022   HGBA1C 7.4 (H) 07/05/2021   MICROALBUR 0.9 01/29/2018   MICROALBUR 1.5 07/10/2016     Lipid Panel ( most recent) Lipid Panel     Component Value Date/Time   CHOL 139 10/09/2022 0000   TRIG 123 10/09/2022 0000   HDL 55 10/09/2022 0000   CHOLHDL 3.8 03/05/2019 1221   VLDL 31 (H) 07/10/2016 0934   LDLCALC 62 10/09/2022 0000   LDLCALC 152 (H) 03/05/2019 1221      Lab Results  Component Value Date   TSH 1.73  10/09/2022   TSH 1.49 03/05/2019   TSH 2.671 10/30/2018   TSH 3.05 04/24/2018   TSH 2.16 01/29/2018   TSH 3.71 07/11/2017   TSH 2.13 07/10/2016   TSH 1.766 05/12/2015   FREET4 1.2 03/05/2019   FREET4 1.05 10/30/2018   FREET4 1.2 01/29/2018   FREET4 1.4 07/11/2017   FREET4 1.0 07/10/2016   FREET4 1.22 05/12/2015       Assessment & Plan:   1. DM type 2 causing vascular disease  - Norma [name] has currently uncontrolled symptomatic type 2 DM since  72 years of age.  She presents with improved glycemic profile.  Her AGP report shows 55% time range, 41% level 1 hyperglycemia, 4% able to hyperglycemia.  Her average blood glucose is 175 mg per DL over the last 14 days.  Her point-of-care A1c is 7.8% improving from 9.6%.    - Recent labs reviewed. - I had a long discussion with her about the possible risk factors and  the pathology behind its diabetes and its complications. -her diabetes is complicated by coronary artery disease, CKD, obesity/sedentary life, polypharmacy, and she remains at a high risk for more acute and chronic complications which include CAD, CVA, CKD, retinopathy, and neuropathy. These are all discussed in detail with her.  - I discussed all available options of managing her diabetes including de-escalation of medications. I have counseled her on Food as Medicine by adopting a Whole Food , Plant Predominant  ( WFPP) nutrition as recommended by [employer] of  Lifestyle Medicine. Patient is encouraged to switch to  unprocessed or minimally processed  complex starch, adequate protein intake (mainly plant source), minimal liquid fat, plenty of fruits, and vegetables. -  she is advised to stick to a routine mealtimes to eat 3 complete meals a day and snack only when necessary ( to snack only to correct hypoglycemia BG <70 day time or <100 at night).   - she acknowledges that there is a room for improvement in her food and drink choices. - Suggestion is made for her to avoid simple carbohydrates  from her diet including Cakes, Sweet Desserts, Ice Cream, Soda (diet and regular), Sweet Tea, Candies, Chips, Cookies, Store Bought Juices, Alcohol , Artificial Sweeteners,  Coffee Creamer, and "Sugar-free" Products, Lemonade. This will help patient to have more stable blood glucose profile and potentially avoid unintended weight gain.  The following Lifestyle Medicine recommendations according to American College of Lifestyle Medicine  [facility]) were discussed and and offered to patient and she  agrees to start the journey:  A. Whole Foods, Plant-Based Nutrition comprising of fruits and vegetables, plant-based proteins, whole-grain carbohydrates was discussed in detail with the patient.   A list for source of those nutrients were also provided to the patient.  Patient will use only water or unsweetened tea for hydration. B.  The need to stay away from risky substances including alcohol, smoking; obtaining 7 to 9 hours of restorative sleep, at least 150 minutes of moderate intensity exercise weekly, the importance of healthy social connections,  and stress management techniques were discussed. C.  A full color page of  Calorie density of various food groups per pound showing examples of each food groups was provided to the patient.   - she will be scheduled with Norm Salt, RDN, CDE for individualized diabetes education.  - I have approached her with the following  individualized plan to manage  her diabetes and patient agrees:   - she  is advised  to increase her Toujeo to 50 units nightly,  associated with utilizing her CGM continuously.   - She is getting Jardiance from her nephrologist,  10 mg p.o. daily at breakfast.  -She is also on glipizide 5 mg XL p.o. daily at breakfast, advised to continue. - she is encouraged to call clinic for blood glucose levels less than 70 or above 200 mg /dl weekly average.  - Specific targets for  A1c;  LDL, HDL,  and Triglycerides were discussed with the patient.  2) Blood Pressure /Hypertension:   -Her blood pressure is controlled to target.   she is advised to continue her current medications including losartan 50 mg p.o. daily with breakfast . 3) Lipids/Hyperlipidemia:   Review of her recent lipid panel showed controlled LDL at 43.    she  is advised to continue    Repatha 140 mg subcutaneously every other week.   4)  Weight/Diet:  Body mass index is 51.52 kg/m.  -   clearly complicating her diabetes care.   she is  a candidate for weight loss. I discussed with her the fact that loss of 5 - 10% of her  current body weight will have the most impact on her diabetes management.  The above detailed  ACLM recommendations for nutrition, exercise, sleep, social life, avoidance of risky substances, the need for restorative sleep   information will also detailed on discharge instructions.  5) hypothyroidism: Longstanding diagnosis.  No recent thyroid function test to review.  She is advised to continue levothyroxine 112 mcg p.o. daily before breakfast.    - We discussed about the correct intake of her thyroid hormone, on empty stomach at fasting, with water, separated by at least 30 minutes from breakfast and other medications,  and separated by more than 4 hours from calcium, iron, multivitamins, acid reflux medications (PPIs). -Patient is made aware of the fact that thyroid hormone replacement is needed for life, dose to be  adjusted by periodic monitoring of thyroid function tests.  6) vitamin D deficiency: She is status post treatment with vitamin D 2 50,000 units.  I would stay she is advised to switch to vitamin D3 2000 units daily for maintenance.   7) Chronic Care/Health Maintenance:  -she  is on ARB  medications and  is encouraged to initiate and continue to follow up with Ophthalmology, Dentist,  Podiatrist at least yearly or according to recommendations, and advised to   stay away from smoking. I have recommended yearly flu vaccine and pneumonia vaccine at least every 5 years; moderate intensity exercise for up to 150 minutes weekly; and  sleep for 7- 9 hours a day.  - she is  advised to maintain close follow up with [name], MD for primary care needs, as well as her other providers for optimal and coordinated care.   I spent  41  minutes in the care of the patient today including review of labs from CMP, Lipids, Thyroid Function, Hematology (current and previous including abstractions from other facilities); face-to-face time discussing  her blood glucose readings/logs, discussing hypoglycemia and hyperglycemia episodes and symptoms, medications doses, her options of short and long term treatment based on the latest standards of care / guidelines;  discussion about incorporating lifestyle medicine;  and documenting the encounter. Risk reduction counseling performed per USPSTF guidelines to reduce  obesity and cardiovascular risk factors.     Please refer to Patient Instructions for Blood Glucose Monitoring and Insulin/Medications Dosing Guide"  in media tab  for additional information. Please  also refer to " Patient Self Inventory" in the Media  tab for reviewed elements of pertinent patient history.  Norma [name] participated in the discussions, expressed understanding, and voiced agreement with the above plans.  All questions were answered to her satisfaction. she is encouraged to contact clinic  should she have any questions or concerns prior to her return visit.   Follow up plan: - No follow-ups on file.  Marquis Lunch, MD [facility] [address] [place], [place] [address] Phone: [phone]  Fax: [phone]    02/04/2023, 10:56 AM  This note was partially dictated with voice recognition software. Similar sounding words can be transcribed inadequately or may not  be corrected upon review.

## 2023-02-04 NOTE — Patient Instructions (Signed)

## 2023-02-05 DIAGNOSIS — J449 Chronic obstructive pulmonary disease, unspecified: Secondary | ICD-10-CM | POA: Diagnosis not present

## 2023-02-05 DIAGNOSIS — I509 Heart failure, unspecified: Secondary | ICD-10-CM | POA: Diagnosis not present

## 2023-02-05 DIAGNOSIS — Z880 Allergy status to penicillin: Secondary | ICD-10-CM | POA: Diagnosis not present

## 2023-02-05 DIAGNOSIS — L97521 Non-pressure chronic ulcer of other part of left foot limited to breakdown of skin: Secondary | ICD-10-CM | POA: Diagnosis not present

## 2023-02-05 DIAGNOSIS — E11621 Type 2 diabetes mellitus with foot ulcer: Secondary | ICD-10-CM | POA: Diagnosis not present

## 2023-02-05 DIAGNOSIS — I110 Hypertensive heart disease with heart failure: Secondary | ICD-10-CM | POA: Diagnosis not present

## 2023-02-07 ENCOUNTER — Encounter: Payer: Self-pay | Admitting: Orthopedic Surgery

## 2023-02-07 ENCOUNTER — Ambulatory Visit (INDEPENDENT_AMBULATORY_CARE_PROVIDER_SITE_OTHER): Payer: Medicare Other | Admitting: Orthopedic Surgery

## 2023-02-07 DIAGNOSIS — M1711 Unilateral primary osteoarthritis, right knee: Secondary | ICD-10-CM

## 2023-02-07 DIAGNOSIS — M170 Bilateral primary osteoarthritis of knee: Secondary | ICD-10-CM | POA: Diagnosis not present

## 2023-02-07 DIAGNOSIS — Z794 Long term (current) use of insulin: Secondary | ICD-10-CM | POA: Diagnosis not present

## 2023-02-07 DIAGNOSIS — M1712 Unilateral primary osteoarthritis, left knee: Secondary | ICD-10-CM

## 2023-02-07 DIAGNOSIS — L97521 Non-pressure chronic ulcer of other part of left foot limited to breakdown of skin: Secondary | ICD-10-CM | POA: Diagnosis not present

## 2023-02-07 DIAGNOSIS — I110 Hypertensive heart disease with heart failure: Secondary | ICD-10-CM | POA: Diagnosis not present

## 2023-02-07 DIAGNOSIS — J449 Chronic obstructive pulmonary disease, unspecified: Secondary | ICD-10-CM | POA: Diagnosis not present

## 2023-02-07 DIAGNOSIS — E11621 Type 2 diabetes mellitus with foot ulcer: Secondary | ICD-10-CM | POA: Diagnosis not present

## 2023-02-07 DIAGNOSIS — I509 Heart failure, unspecified: Secondary | ICD-10-CM | POA: Diagnosis not present

## 2023-02-07 MED ORDER — HYALURONAN 30 MG/2ML IX SOSY
30.0000 mg | PREFILLED_SYRINGE | INTRA_ARTICULAR | Status: AC
  Administered 2023-02-07 – 2023-02-21 (×3): 30 mg via INTRA_ARTICULAR

## 2023-02-07 NOTE — Progress Notes (Signed)
Chief Complaint  Patient presents with   Injections    Bilateral orthovisc #1    . Encounter Diagnoses  Name Primary?   Primary osteoarthritis of left knee Yes   Primary osteoarthritis of right knee     Chief Complaint  Patient presents with   Injections    Bilateral orthovisc #1    Encounter Diagnoses  Name Primary?   Primary osteoarthritis of left knee Yes   Primary osteoarthritis of right knee     The patient has consented to and requested hyaluronic acid injection   MEDICATION: Orthovisc  bilaterally  THE knee is prepped with alcohol and ethyl chloride  The injection is performed with a 21-gauge needle, via inferolateral approach  No complications were noted  Appropriate instructions post injection were given   1 week

## 2023-02-10 ENCOUNTER — Other Ambulatory Visit (HOSPITAL_COMMUNITY): Payer: Self-pay | Admitting: Neurosurgery

## 2023-02-10 DIAGNOSIS — E11621 Type 2 diabetes mellitus with foot ulcer: Secondary | ICD-10-CM | POA: Diagnosis not present

## 2023-02-10 DIAGNOSIS — I110 Hypertensive heart disease with heart failure: Secondary | ICD-10-CM | POA: Diagnosis not present

## 2023-02-10 DIAGNOSIS — Z794 Long term (current) use of insulin: Secondary | ICD-10-CM | POA: Diagnosis not present

## 2023-02-10 DIAGNOSIS — M5416 Radiculopathy, lumbar region: Secondary | ICD-10-CM

## 2023-02-10 DIAGNOSIS — I509 Heart failure, unspecified: Secondary | ICD-10-CM | POA: Diagnosis not present

## 2023-02-10 DIAGNOSIS — L97521 Non-pressure chronic ulcer of other part of left foot limited to breakdown of skin: Secondary | ICD-10-CM | POA: Diagnosis not present

## 2023-02-10 DIAGNOSIS — J449 Chronic obstructive pulmonary disease, unspecified: Secondary | ICD-10-CM | POA: Diagnosis not present

## 2023-02-10 DIAGNOSIS — Z6841 Body Mass Index (BMI) 40.0 and over, adult: Secondary | ICD-10-CM | POA: Diagnosis not present

## 2023-02-11 DIAGNOSIS — Z23 Encounter for immunization: Secondary | ICD-10-CM | POA: Diagnosis not present

## 2023-02-12 ENCOUNTER — Telehealth (INDEPENDENT_AMBULATORY_CARE_PROVIDER_SITE_OTHER): Payer: Medicare Other | Admitting: Psychiatry

## 2023-02-12 ENCOUNTER — Encounter (HOSPITAL_COMMUNITY): Payer: Self-pay | Admitting: Psychiatry

## 2023-02-12 ENCOUNTER — Telehealth: Payer: Self-pay | Admitting: "Endocrinology

## 2023-02-12 DIAGNOSIS — Z79899 Other long term (current) drug therapy: Secondary | ICD-10-CM | POA: Diagnosis not present

## 2023-02-12 DIAGNOSIS — F3131 Bipolar disorder, current episode depressed, mild: Secondary | ICD-10-CM

## 2023-02-12 DIAGNOSIS — J449 Chronic obstructive pulmonary disease, unspecified: Secondary | ICD-10-CM | POA: Diagnosis not present

## 2023-02-12 DIAGNOSIS — L97521 Non-pressure chronic ulcer of other part of left foot limited to breakdown of skin: Secondary | ICD-10-CM | POA: Diagnosis not present

## 2023-02-12 DIAGNOSIS — Z880 Allergy status to penicillin: Secondary | ICD-10-CM | POA: Diagnosis not present

## 2023-02-12 DIAGNOSIS — E11621 Type 2 diabetes mellitus with foot ulcer: Secondary | ICD-10-CM | POA: Diagnosis not present

## 2023-02-12 DIAGNOSIS — I509 Heart failure, unspecified: Secondary | ICD-10-CM | POA: Diagnosis not present

## 2023-02-12 DIAGNOSIS — Z87891 Personal history of nicotine dependence: Secondary | ICD-10-CM | POA: Diagnosis not present

## 2023-02-12 DIAGNOSIS — F331 Major depressive disorder, recurrent, moderate: Secondary | ICD-10-CM

## 2023-02-12 DIAGNOSIS — Z888 Allergy status to other drugs, medicaments and biological substances status: Secondary | ICD-10-CM | POA: Diagnosis not present

## 2023-02-12 DIAGNOSIS — I110 Hypertensive heart disease with heart failure: Secondary | ICD-10-CM | POA: Diagnosis not present

## 2023-02-12 MED ORDER — ALPRAZOLAM 0.25 MG PO TABS: 0.2500 mg | ORAL_TABLET | Freq: Every evening | ORAL | 2 refills | Status: DC | PRN

## 2023-02-12 MED ORDER — LAMOTRIGINE 150 MG PO TABS: 150.0000 mg | ORAL_TABLET | Freq: Every day | ORAL | 2 refills | Status: DC

## 2023-02-12 MED ORDER — VENLAFAXINE HCL ER 150 MG PO CP24: ORAL_CAPSULE | ORAL | 2 refills | Status: DC

## 2023-02-12 MED ORDER — BUPROPION HCL ER (XL) 150 MG PO TB24: 150.0000 mg | ORAL_TABLET | Freq: Every morning | ORAL | 2 refills | Status: DC

## 2023-02-12 MED ORDER — ARIPIPRAZOLE 5 MG PO TABS: 5.0000 mg | ORAL_TABLET | Freq: Every day | ORAL | 2 refills | Status: DC

## 2023-02-12 NOTE — Progress Notes (Signed)
Virtual Visit via Telephone Note  I connected with Norma [name] on 02/12/23 at 11:00 AM EDT by telephone and verified that I am speaking with the correct person using two identifiers.  Location: Patient: home Provider: office   I discussed the limitations, risks, security and privacy concerns of performing an evaluation and management service by telephone and the availability of in person appointments. I also discussed with the patient that there may be a patient responsible charge related to this service. The patient expressed understanding and agreed to proceed.      I discussed the assessment and treatment plan with the patient. The patient was provided an opportunity to ask questions and all were answered. The patient agreed with the plan and demonstrated an understanding of the instructions.   The patient was advised to call back or seek an in-person evaluation if the symptoms worsen or if the condition fails to improve as anticipated.  I provided 15 minutes of non-face-to-face time during this encounter.   Diannia Ruder, MD  [facility] MD/PA/NP OP Progress Note  02/12/2023 11:12 AM Norma [name]  MRN:  [id number]  Chief Complaint:  Chief Complaint  Patient presents with   Depression   Anxiety   Follow-up   HPI: This patient is a 72 year old white female who lives alone in [place].  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  The patient returns for follow-up after 3 months regarding her mood swings and depression.  She states overall she is doing fairly well she still dealing with a lot of chronic health issues such as neuropathy radiculopathy pain in her legs and back as well as a healing ulcer on her foot.  All of this has made her less mobile.  She does get out with her family somewhat.  She is also let a young woman who is pregnant lives with her temporarily and she is enjoying the company.  She still complains of shortness of breath and I urged her to  talk to her pulmonologist about this.  She denies significant depression anxiety thoughts of self-harm or suicide racing thoughts or severe mood swings.  She occasionally uses the Xanax to help her sleep or to help her "shaking spells." Visit Diagnosis:    ICD-10-CM   1. Bipolar affective disorder, currently depressed, mild (HCC)  F31.31     2. Moderate episode of recurrent major depressive disorder (HCC)  F33.1       Past Psychiatric History:  She was hospitalized in 2001 and went through a course of ECT.  She has been tried on [employer] Wellbutrin and Zyprexa   Past Medical History:  Past Medical History:  Diagnosis Date   Anemia    Anxiety    Bipolar disorder (HCC)    Bulging lumbar disc    L3-4   Chronic daily headache    Chronic low back pain 09/20/2014   CKD (chronic kidney disease)    stage 3   COPD (chronic obstructive pulmonary disease) (HCC)    Coronary artery disease    CABG 2019   Degenerative arthritis    Depression    Diabetes mellitus, type II (HCC)    Diabetic neuropathy (HCC) 09/16/2018   DM type 2 with diabetic peripheral neuropathy (HCC) 09/20/2014   Dyslipidemia    Dyspnea    Family history of adverse reaction to anesthesia    father had reaction to anesthesia medication per patient "they don't use it anymore"- unsure of  reaction   Fatty liver    per pt report   Gastroparesis    GERD (gastroesophageal reflux disease)    Heart murmur    History of hiatal hernia    pt states she no longer has this   Hypertension    Hypothyroidism    IBS (irritable bowel syndrome)    LBBB (left bundle branch block)    Memory difficulty 09/20/2014   Morbid obesity (HCC)    Myocardial infarction (HCC)    per pt- prior to CABG   Neuropathy    Obstructive sleep apnea on CPAP    Renal insufficiency    Restless legs syndrome (RLS) 09/17/2012   Stroke (cerebrum) (HCC) 05/16/2017   Left parietal   Urticaria     Past Surgical History:  Procedure Laterality Date    ABDOMINAL HYSTERECTOMY     ARTHROSCOPY KNEE W/ DRILLING  06/2011   and Decemer of 2013.   Carpel tunnel  1980's   CATARACT EXTRACTION W/PHACO Right 03/21/2017   Procedure: CATARACT EXTRACTION PHACO AND INTRAOCULAR LENS PLACEMENT RIGHT EYE;  Surgeon: Fabio Pierce, MD;  Location: [facility];  Service: Ophthalmology;  Laterality: Right;  CDE: 2.91    CATARACT EXTRACTION W/PHACO Left 04/18/2017   Procedure: CATARACT EXTRACTION PHACO AND INTRAOCULAR LENS PLACEMENT LEFT EYE;  Surgeon: Fabio Pierce, MD;  Location: [facility];  Service: Ophthalmology;  Laterality: Left;  left   CHOLECYSTECTOMY     COLONOSCOPY WITH PROPOFOL N/A 10/04/2016   Procedure: COLONOSCOPY WITH PROPOFOL;  Surgeon: Malissa Hippo, MD;  Location: [facility] SU[facility];  Service: Endoscopy;  Laterality: N/A;  11:10   CORONARY ARTERY BYPASS GRAFT N/A 08/29/2017   Procedure: CORONARY ARTERY BYPASS GRAFTING (CABG) x 3 WITH ENDOSCOPIC HARVESTING OF RIGHT SAPHENOUS VEIN;  Surgeon: Kerin Perna, MD;  Location[facility] OR;  Service: Open Heart Surgery;  Laterality: N/A;   ESOPHAGOGASTRODUODENOSCOPY N/A 04/25/2016   Procedure: ESOPHAGOGASTRODUODENOSCOPY (EGD);  Surgeon: Malissa Hippo, MD;  [facility]: [facility];  Service: Endoscopy;  Laterality: N/A;  730   LEFT HEART CATH AND CORONARY ANGIOGRAPHY N/A 08/27/2017   Procedure: LEFT HEART CATH AND CORONARY ANGIOGRAPHY;  Surgeon: Tonny Bollman, MD;  Location: [facility] INVASIVE CV LAB;  Service: Cardiovascular::  pLAD 95% - p-mLAD 50%, ostD1 90% -pD1 80%; ostOM1 90%; rPDA 80%. EF ~50-55% - HK of dital Anterolateral & Apical wall.  - Rec CVTS c/s   NECK SURGERY     pt reports having growth removed from the back of her neck- after 2021   OPEN REDUCTION INTERNAL FIXATION (ORIF) HAND Left 04/10/2020   Procedure: OPEN REDUCTION INTERNAL FIXATION (ORIF) HAND, left index finger;  Surgeon: Vickki Hearing, MD;  Location: [facility];  Service: Orthopedics;  Laterality: Left;  0.045 k wires   RECTAL SURGERY      fissure   SHOULDER SURGERY Left    arthroscopy in March of this year   TEE WITHOUT CARDIOVERSION N/A 08/29/2017   Procedure: TRANSESOPHAGEAL ECHOCARDIOGRAM (TEE);  Surgeon: Donata Clay, Theron Arista, MD;  Location: [facility] OR;  Service: Open Heart Surgery;  Laterality: N/A;   TRANSFORAMINAL LUMBAR INTERBODY FUSION W/ MIS 1 LEVEL Right 07/12/2021   Procedure: Minimally Invasive Surgery Transforaminal Lumbar Interbody Fusion  Lumbar four-five;  Surgeon: Bedelia Person, MD;  Location: [facility] OR;  Service: Neurosurgery;  Laterality: Right;   TRANSTHORACIC ECHOCARDIOGRAM  06/06/2017   Mild to moderate reduced EF 40 and 45%.  Anterior septal, inferoseptal and basal to mid inferior hypokinesis.  GR 1 DD.  No significant valvular lesion    Family Psychiatric History: See below  Family History:  Family History  Problem Relation Age of Onset   Hypertension Mother    Lymphoma Mother    Depression Mother    Arthritis Father    Alcohol abuse Father    Hypertension Sister    Cancer Brother        kidney and lung   Alcohol abuse Brother    Alcohol abuse Paternal Uncle    Alcohol abuse Paternal Grandfather    Alcohol abuse Paternal Grandmother    Allergic rhinitis Neg Hx    Angioedema Neg Hx    Asthma Neg Hx    Atopy Neg Hx    Eczema Neg Hx    Immunodeficiency Neg Hx    Urticaria Neg Hx     Social History:  Social History   Socioeconomic History   Marital status: Single    Spouse name: Not on file   Number of children: 0   Years of education: 16   Highest education level: Not on file  Occupational History    Employer: [employer]  Tobacco Use   Smoking status: Former    Current packs/day: 0.00    Types: Cigarettes    Quit date: 02/04/1971    Years since quitting: 52.0   Smokeless tobacco: Never   Tobacco comments:    smoked 2 cigarettes a day  Vaping Use   Vaping status: Never Used  Substance and Sexual Activity   Alcohol use: No    Alcohol/week: 0.0 standard drinks of alcohol    Drug use: No   Sexual activity: Never  Other Topics Concern   Not on file  Social History Narrative   Patient lives at home alone.    Patient has no children.    Patient has her masters in nursing.    Patient is single.    Patient drinks about 2 glasses of tea daily.   Patient is right handed.   Social Determinants of Health   Financial Resource Strain: Medium Risk (12/18/2021)   Overall Financial Resource Strain (CARDIA)    Difficulty of Paying Living Expenses: Somewhat hard  Food Insecurity: No Food Insecurity (12/18/2021)   Hunger Vital Sign    Worried About Running Out of Food in the Last Year: Never true    Ran Out of Food in the Last Year: Never true  Transportation Needs: No Transportation Needs (12/18/2021)   PRAPARE - Administrator, Civil Service (Medical): No    Lack of Transportation (Non-Medical): No  Physical Activity: Not on file  Stress: No Stress Concern Present (10/04/2021)   [employer] of Occupational Health - Occupational Stress Questionnaire    Feeling of Stress : Only a little  Social Connections: Not on file    Allergies:  Allergies  Allergen Reactions   Amoxicillin Anaphylaxis and Other (See Comments)    Has patient had a PCN reaction causing immediate rash, facial/tongue/throat swelling, SOB or lightheadedness with hypotension: Yes Has patient had a PCN reaction causing severe rash involving mucus membranes or skin necrosis: Yes Has patient had a PCN reaction that required hospitalization: Yes Has patient had a PCN reaction occurring within the last 10 years: Yes If all of the above answers are "NO", then may proceed with Cephalosporin use.    Hydrocodone Anaphylaxis   Percocet [Oxycodone-Acetaminophen] Other (See Comments)    Causes chest pain /tightness. Pressure pain around her ribs.   Depacon [Valproate Sodium]  Depacon [Valproic Acid] Other (See Comments)    Causes falls    Oxycodone    Oxycodone Hcl    Prednisone Itching     Metabolic Disorder Labs: Lab Results  Component Value Date   HGBA1C 7.8 (A) 02/04/2023   MPG 165.68 07/05/2021   MPG 148 03/05/2019   No results found for: "PROLACTIN" Lab Results  Component Value Date   CHOL 139 10/09/2022   TRIG 123 10/09/2022   HDL 55 10/09/2022   CHOLHDL 3.8 03/05/2019   VLDL 31 (H) 07/10/2016   LDLCALC 62 10/09/2022   LDLCALC 43 11/08/2019   Lab Results  Component Value Date   TSH 1.73 10/09/2022   TSH 1.49 03/05/2019    Therapeutic Level Labs: No results found for: "LITHIUM" No results found for: "VALPROATE" No results found for: "CBMZ"  Current Medications: Current Outpatient Medications  Medication Sig Dispense Refill   Acetaminophen (TYLENOL ARTHRITIS EXT RELIEF PO)      albuterol (VENTOLIN HFA) 108 (90 Base) MCG/ACT inhaler Inhale into the lungs every 6 (six) hours as needed for wheezing or shortness of breath.     ALPRAZolam (XANAX) 0.25 MG tablet Take 1 tablet (0.25 mg total) by mouth at bedtime as needed for anxiety or sleep. 30 tablet 2   ARIPiprazole (ABILIFY) 5 MG tablet Take 1 tablet (5 mg total) by mouth daily. 30 tablet 2   aspirin EC 81 MG tablet Take 1 tablet (81 mg total) by mouth daily. 90 tablet 3   azelastine (ASTELIN) 0.1 % nasal spray Place into both nostrils 2 (two) times daily. Use in each nostril as directed     buPROPion (WELLBUTRIN XL) 150 MG 24 hr tablet Take 1 tablet (150 mg total) by mouth every morning. 30 tablet 2   carvedilol (COREG) 6.25 MG tablet Take 1 tablet (6.25 mg total) by mouth 2 (two) times daily with a meal. 180 tablet 2   Cholecalciferol (VITAMIN D3) 50 MCG (2000 UT) capsule Take 1 capsule (2,000 Units total) by mouth daily with lunch. 90 capsule 3   diclofenac Sodium (VOLTAREN) 1 % GEL Apply 2 g topically 4 (four) times daily as needed (pain).     diphenhydrAMINE (BENADRYL) 25 mg capsule Take 25 mg by mouth as needed for allergies or itching.     empagliflozin (JARDIANCE) 10 MG TABS tablet Take 10 mg  by mouth daily with breakfast.     EPINEPHrine 0.3 mg/0.3 mL IJ SOAJ injection Inject 0.3 mg into the muscle once.     Evolocumab (REPATHA SURECLICK) 140 MG/ML SOAJ INJECT 1 DOSE UNDER THE SKIN EVERY 14 DAYS 6 mL 0   ferrous sulfate 325 (65 FE) MG EC tablet Take 325 mg by mouth. One daily     gabapentin (NEURONTIN) 400 MG capsule Take 400 mg by mouth in the morning. & 800 mg at bedtime     glipiZIDE (GLUCOTROL XL) 5 MG 24 hr tablet Take 1 tablet (5 mg total) by mouth daily with breakfast. 90 tablet 1   insulin glargine, 1 Unit Dial, (TOUJEO SOLOSTAR) 300 UNIT/ML Solostar Pen Inject 50 Units into the skin at bedtime. 12 mL 1   lamoTRIgine (LAMICTAL) 150 MG tablet Take 1 tablet (150 mg total) by mouth at bedtime. 30 tablet 2   levocetirizine (XYZAL) 5 MG tablet Take 5 mg by mouth daily.     levothyroxine (SYNTHROID) 112 MCG tablet Take 112 mcg by mouth daily before breakfast.     losartan (COZAAR) 25 MG tablet Take 25  mg by mouth daily.     meclizine (ANTIVERT) 25 MG tablet Take 1 tablet (25 mg total) by mouth 3 (three) times daily as needed for dizziness. (Patient taking differently: Take 25 mg by mouth daily as needed for dizziness.) 30 tablet 0   mupirocin ointment (BACTROBAN) 2 %      ondansetron (ZOFRAN) 4 MG tablet Take 1 tablet (4 mg total) by mouth every 8 (eight) hours as needed for nausea or vomiting. 20 tablet 0   OVER THE COUNTER MEDICATION daily as needed (pain). CBD ointment/roll on     pantoprazole (PROTONIX) 40 MG tablet Take 1 tablet by mouth daily before supper. 90 tablet 3   rOPINIRole (REQUIP) 3 MG tablet TAKE ONE TABLET BY MOUTH EVERY MORNING, ,NOON AND AT BEDTIME. (PRT PT MORNING,EVENING,BEDTIME) (Patient taking differently: Take 3 mg by mouth 3 (three) times daily.) 270 tablet 3   tiZANidine (ZANAFLEX) 2 MG tablet Take 2 mg by mouth 2 (two) times daily.     torsemide (DEMADEX) 20 MG tablet Take 40 mg by mouth daily.     venlafaxine XR (EFFEXOR-XR) 150 MG 24 hr capsule TAKE TWO  (2) CAPSULES BY MOUTH AT BEDTIME 60 capsule 2   Current Facility-Administered Medications  Medication Dose Route Frequency Provider Last Rate Last Admin   Hyaluronan (ORTHOVISC) intra-articular injection 30 mg  30 mg Intra-articular Weekly Vickki Hearing, MD   30 mg at 02/07/23 1117   Hyaluronan (ORTHOVISC) intra-articular injection 30 mg  30 mg Intra-articular Weekly Vickki Hearing, MD   30 mg at 02/07/23 1117     Musculoskeletal: Strength & Muscle Tone: na Gait & Station: na Patient leans: N/A  Psychiatric Specialty Exam: Review of Systems  Respiratory:  Positive for shortness of breath.   Musculoskeletal:  Positive for arthralgias, back pain and gait problem.  All other systems reviewed and are negative.   There were no vitals taken for this visit.There is no height or weight on file to calculate BMI.  General Appearance: NA  Eye Contact:  NA  Speech:  Clear and Coherent  Volume:  Normal  Mood:  Euthymic  Affect:  NA  Thought Process:  Goal Directed  Orientation:  Full (Time, Place, and Person)  Thought Content: WDL   Suicidal Thoughts:  No  Homicidal Thoughts:  No  Memory:  Immediate;   Good Recent;   Good Remote;   Good  Judgement:  Good  Insight:  Good  Psychomotor Activity:  Decreased  Concentration:  Concentration: Good and Attention Span: Good  Recall:  Good  Fund of Knowledge: Good  Language: Good  Akathisia:  No  Handed:  Right  AIMS (if indicated): not done  Assets:  Communication Skills Desire for Improvement Resilience Social Support  ADL's:  Intact  Cognition: WNL  Sleep:  Good   Screenings: Mini-Mental    Flowsheet Row Office Visit from 02/18/2018 in [facility] Office Visit from 09/17/2017 in [facility]  Total Score (max 30 points ) 28 28      PHQ2-9    Flowsheet Row Video Visit from 08/13/2022 in [facility] [place] Video Visit from  01/21/2022 in [facility] at [place] Video Visit from 11/26/2021 in [facility] at [facility] Coordination from 11/14/2021 in [facility] Coordination Patient Outreach Telephone from 10/04/2021 in [facility] Coordination  PHQ-2 Total Score 1 1 4  2  1  PHQ-9 Total Score -- -- 9 11 --      Flowsheet Row Video Visit from 08/13/2022 in [facility] [place] Video Visit from 01/21/2022 in [facility] [place] ED from 01/13/2022 in [facility] Emergency Department at [facility]  C-SSRS RISK CATEGORY No Risk No Risk No Risk        Assessment and Plan: This patient is a 72 year old female with a history of depression anxiety mood swings and insomnia.  She continues to do well on her current regimen.  She will continue Wellbutrin XL 150 mg daily as well as Effexor XR 300 mg daily for depression, Abilify 5 mg daily for augmentation, Lamictal 100 mg daily for mood stabilization and Xanax 0.25 mg at bedtime as needed for anxiety or sleep.  She will return to see me in 3 months  Collaboration of Care: Collaboration of Care: Primary Care Provider AEB notes will be shared with PCP at patient's request  Patient/Guardian was advised Release of Information must be obtained prior to any record release in order to collaborate their care with an outside provider. Patient/Guardian was advised if they have not already done so to contact the registration department to sign all necessary forms in order for [place] to release information regarding their care.   Consent: Patient/Guardian gives verbal consent for treatment and assignment of benefits for services provided during this visit. Patient/Guardian expressed understanding and agreed to proceed.    Diannia Ruder, MD 02/12/2023, 11:12 AM

## 2023-02-12 NOTE — Telephone Encounter (Signed)
Pt states she needs diabetic shoes and needs RX sent to [facility]. The wound center stated she really needs these.

## 2023-02-13 DIAGNOSIS — Z87891 Personal history of nicotine dependence: Secondary | ICD-10-CM | POA: Diagnosis not present

## 2023-02-13 DIAGNOSIS — E1140 Type 2 diabetes mellitus with diabetic neuropathy, unspecified: Secondary | ICD-10-CM | POA: Diagnosis not present

## 2023-02-13 DIAGNOSIS — Z9981 Dependence on supplemental oxygen: Secondary | ICD-10-CM | POA: Diagnosis not present

## 2023-02-13 DIAGNOSIS — J449 Chronic obstructive pulmonary disease, unspecified: Secondary | ICD-10-CM | POA: Diagnosis not present

## 2023-02-13 DIAGNOSIS — D1724 Benign lipomatous neoplasm of skin and subcutaneous tissue of left leg: Secondary | ICD-10-CM | POA: Diagnosis not present

## 2023-02-13 DIAGNOSIS — Z794 Long term (current) use of insulin: Secondary | ICD-10-CM | POA: Diagnosis not present

## 2023-02-13 DIAGNOSIS — I509 Heart failure, unspecified: Secondary | ICD-10-CM | POA: Diagnosis not present

## 2023-02-13 DIAGNOSIS — L97521 Non-pressure chronic ulcer of other part of left foot limited to breakdown of skin: Secondary | ICD-10-CM | POA: Diagnosis not present

## 2023-02-13 DIAGNOSIS — E039 Hypothyroidism, unspecified: Secondary | ICD-10-CM | POA: Diagnosis not present

## 2023-02-13 DIAGNOSIS — Z7982 Long term (current) use of aspirin: Secondary | ICD-10-CM | POA: Diagnosis not present

## 2023-02-13 DIAGNOSIS — Z604 Social exclusion and rejection: Secondary | ICD-10-CM | POA: Diagnosis not present

## 2023-02-13 DIAGNOSIS — Z8673 Personal history of transient ischemic attack (TIA), and cerebral infarction without residual deficits: Secondary | ICD-10-CM | POA: Diagnosis not present

## 2023-02-13 DIAGNOSIS — Z7984 Long term (current) use of oral hypoglycemic drugs: Secondary | ICD-10-CM | POA: Diagnosis not present

## 2023-02-13 DIAGNOSIS — M109 Gout, unspecified: Secondary | ICD-10-CM | POA: Diagnosis not present

## 2023-02-13 DIAGNOSIS — E11621 Type 2 diabetes mellitus with foot ulcer: Secondary | ICD-10-CM | POA: Diagnosis not present

## 2023-02-13 DIAGNOSIS — I110 Hypertensive heart disease with heart failure: Secondary | ICD-10-CM | POA: Diagnosis not present

## 2023-02-14 ENCOUNTER — Ambulatory Visit (INDEPENDENT_AMBULATORY_CARE_PROVIDER_SITE_OTHER): Payer: Medicare Other | Admitting: Orthopedic Surgery

## 2023-02-14 DIAGNOSIS — L97521 Non-pressure chronic ulcer of other part of left foot limited to breakdown of skin: Secondary | ICD-10-CM | POA: Diagnosis not present

## 2023-02-14 DIAGNOSIS — Z794 Long term (current) use of insulin: Secondary | ICD-10-CM | POA: Diagnosis not present

## 2023-02-14 DIAGNOSIS — I509 Heart failure, unspecified: Secondary | ICD-10-CM | POA: Diagnosis not present

## 2023-02-14 DIAGNOSIS — M170 Bilateral primary osteoarthritis of knee: Secondary | ICD-10-CM | POA: Diagnosis not present

## 2023-02-14 DIAGNOSIS — M1712 Unilateral primary osteoarthritis, left knee: Secondary | ICD-10-CM

## 2023-02-14 DIAGNOSIS — M1711 Unilateral primary osteoarthritis, right knee: Secondary | ICD-10-CM

## 2023-02-14 DIAGNOSIS — I110 Hypertensive heart disease with heart failure: Secondary | ICD-10-CM | POA: Diagnosis not present

## 2023-02-14 DIAGNOSIS — E11621 Type 2 diabetes mellitus with foot ulcer: Secondary | ICD-10-CM | POA: Diagnosis not present

## 2023-02-14 DIAGNOSIS — J449 Chronic obstructive pulmonary disease, unspecified: Secondary | ICD-10-CM | POA: Diagnosis not present

## 2023-02-14 NOTE — Progress Notes (Signed)
Orthovisc  Chief Complaint  Patient presents with   Knee Problem    HA orthovisc both knees     Encounter Diagnoses  Name Primary?   Primary osteoarthritis of left knee Yes   Primary osteoarthritis of right knee     The patient has consented to and requested hyaluronic acid injection   MEDICATION: orthovissc   bilaterally  THE knee is prepped with alcohol and ethyl chloride  The injection is performed with a 21-gauge needle, via inferolateral approach  No complications were noted  Appropriate instructions post injection were given   1 week  Orthovisc 3

## 2023-02-17 DIAGNOSIS — I110 Hypertensive heart disease with heart failure: Secondary | ICD-10-CM | POA: Diagnosis not present

## 2023-02-17 DIAGNOSIS — I509 Heart failure, unspecified: Secondary | ICD-10-CM | POA: Diagnosis not present

## 2023-02-17 DIAGNOSIS — L97521 Non-pressure chronic ulcer of other part of left foot limited to breakdown of skin: Secondary | ICD-10-CM | POA: Diagnosis not present

## 2023-02-17 DIAGNOSIS — E11621 Type 2 diabetes mellitus with foot ulcer: Secondary | ICD-10-CM | POA: Diagnosis not present

## 2023-02-17 DIAGNOSIS — Z794 Long term (current) use of insulin: Secondary | ICD-10-CM | POA: Diagnosis not present

## 2023-02-17 DIAGNOSIS — J449 Chronic obstructive pulmonary disease, unspecified: Secondary | ICD-10-CM | POA: Diagnosis not present

## 2023-02-19 DIAGNOSIS — J449 Chronic obstructive pulmonary disease, unspecified: Secondary | ICD-10-CM | POA: Diagnosis not present

## 2023-02-19 DIAGNOSIS — L97521 Non-pressure chronic ulcer of other part of left foot limited to breakdown of skin: Secondary | ICD-10-CM | POA: Diagnosis not present

## 2023-02-19 DIAGNOSIS — Z880 Allergy status to penicillin: Secondary | ICD-10-CM | POA: Diagnosis not present

## 2023-02-19 DIAGNOSIS — I110 Hypertensive heart disease with heart failure: Secondary | ICD-10-CM | POA: Diagnosis not present

## 2023-02-19 DIAGNOSIS — I509 Heart failure, unspecified: Secondary | ICD-10-CM | POA: Diagnosis not present

## 2023-02-19 DIAGNOSIS — E11621 Type 2 diabetes mellitus with foot ulcer: Secondary | ICD-10-CM | POA: Diagnosis not present

## 2023-02-20 ENCOUNTER — Ambulatory Visit (HOSPITAL_COMMUNITY)
Admission: RE | Admit: 2023-02-20 | Discharge: 2023-02-20 | Disposition: A | Discharge status: Home / Self Care | Payer: Medicare Other | Source: Ambulatory Visit | Attending: Neurosurgery | Admitting: Neurosurgery

## 2023-02-20 DIAGNOSIS — M5126 Other intervertebral disc displacement, lumbar region: Secondary | ICD-10-CM | POA: Diagnosis not present

## 2023-02-20 DIAGNOSIS — M5136 Other intervertebral disc degeneration, lumbar region with discogenic back pain only: Secondary | ICD-10-CM | POA: Diagnosis not present

## 2023-02-20 DIAGNOSIS — M47816 Spondylosis without myelopathy or radiculopathy, lumbar region: Secondary | ICD-10-CM | POA: Diagnosis not present

## 2023-02-20 DIAGNOSIS — M5416 Radiculopathy, lumbar region: Secondary | ICD-10-CM | POA: Insufficient documentation

## 2023-02-20 DIAGNOSIS — M5137 Other intervertebral disc degeneration, lumbosacral region with discogenic back pain only: Secondary | ICD-10-CM | POA: Diagnosis not present

## 2023-02-20 NOTE — Telephone Encounter (Signed)
Please Advise

## 2023-02-21 ENCOUNTER — Encounter: Payer: Self-pay | Admitting: Orthopedic Surgery

## 2023-02-21 ENCOUNTER — Ambulatory Visit (INDEPENDENT_AMBULATORY_CARE_PROVIDER_SITE_OTHER): Payer: Medicare Other | Admitting: Orthopedic Surgery

## 2023-02-21 DIAGNOSIS — M170 Bilateral primary osteoarthritis of knee: Secondary | ICD-10-CM

## 2023-02-21 DIAGNOSIS — J449 Chronic obstructive pulmonary disease, unspecified: Secondary | ICD-10-CM | POA: Diagnosis not present

## 2023-02-21 DIAGNOSIS — M1711 Unilateral primary osteoarthritis, right knee: Secondary | ICD-10-CM

## 2023-02-21 DIAGNOSIS — I509 Heart failure, unspecified: Secondary | ICD-10-CM | POA: Diagnosis not present

## 2023-02-21 DIAGNOSIS — E11621 Type 2 diabetes mellitus with foot ulcer: Secondary | ICD-10-CM | POA: Diagnosis not present

## 2023-02-21 DIAGNOSIS — M1712 Unilateral primary osteoarthritis, left knee: Secondary | ICD-10-CM

## 2023-02-21 DIAGNOSIS — Z794 Long term (current) use of insulin: Secondary | ICD-10-CM | POA: Diagnosis not present

## 2023-02-21 DIAGNOSIS — I110 Hypertensive heart disease with heart failure: Secondary | ICD-10-CM | POA: Diagnosis not present

## 2023-02-21 DIAGNOSIS — L97521 Non-pressure chronic ulcer of other part of left foot limited to breakdown of skin: Secondary | ICD-10-CM | POA: Diagnosis not present

## 2023-02-21 NOTE — Progress Notes (Signed)
Encounter Diagnoses  Name Primary?   Primary osteoarthritis of left knee Yes   Primary osteoarthritis of right knee     Procedure note for injection of hyaluronic acid   Diagnosis osteoarthritis of the knee  Verbal consent was obtained to inject the knee with HYALURONIC ACID . Timeout was completed to confirm the injection site as the RIGHT    Knee  Ethyl chloride spray was used for anesthesia Alcohol was used to prep the skin. The infrapatellar lateral portal was used as an injection site and 1 vial of hyaluronic acid  was injected into the knee  Specific Co. Preparation: ORTHOVISC  No complications were noted   Chief Complaint  Patient presents with   Injections    Both knees Orthovisc    Encounter Diagnoses  Name Primary?   Primary osteoarthritis of left knee Yes   Primary osteoarthritis of right knee     The patient has consented to and requested hyaluronic acid injection   MEDICATION: ORTHOVISC  left  THE knee is prepped with alcohol and ethyl chloride  The injection is performed with a 21-gauge needle, via inferolateral approach  No complications were noted  Appropriate instructions post injection were given   PRN

## 2023-02-24 ENCOUNTER — Ambulatory Visit (INDEPENDENT_AMBULATORY_CARE_PROVIDER_SITE_OTHER): Payer: Medicare Other | Admitting: Gastroenterology

## 2023-02-24 DIAGNOSIS — I110 Hypertensive heart disease with heart failure: Secondary | ICD-10-CM | POA: Diagnosis not present

## 2023-02-24 DIAGNOSIS — J449 Chronic obstructive pulmonary disease, unspecified: Secondary | ICD-10-CM | POA: Diagnosis not present

## 2023-02-24 DIAGNOSIS — L97521 Non-pressure chronic ulcer of other part of left foot limited to breakdown of skin: Secondary | ICD-10-CM | POA: Diagnosis not present

## 2023-02-24 DIAGNOSIS — I509 Heart failure, unspecified: Secondary | ICD-10-CM | POA: Diagnosis not present

## 2023-02-24 DIAGNOSIS — Z794 Long term (current) use of insulin: Secondary | ICD-10-CM | POA: Diagnosis not present

## 2023-02-24 DIAGNOSIS — E11621 Type 2 diabetes mellitus with foot ulcer: Secondary | ICD-10-CM | POA: Diagnosis not present

## 2023-02-27 DIAGNOSIS — E11621 Type 2 diabetes mellitus with foot ulcer: Secondary | ICD-10-CM | POA: Diagnosis not present

## 2023-02-27 DIAGNOSIS — J449 Chronic obstructive pulmonary disease, unspecified: Secondary | ICD-10-CM | POA: Diagnosis not present

## 2023-02-27 DIAGNOSIS — Z794 Long term (current) use of insulin: Secondary | ICD-10-CM | POA: Diagnosis not present

## 2023-02-27 DIAGNOSIS — L97521 Non-pressure chronic ulcer of other part of left foot limited to breakdown of skin: Secondary | ICD-10-CM | POA: Diagnosis not present

## 2023-02-27 DIAGNOSIS — I110 Hypertensive heart disease with heart failure: Secondary | ICD-10-CM | POA: Diagnosis not present

## 2023-02-27 DIAGNOSIS — I509 Heart failure, unspecified: Secondary | ICD-10-CM | POA: Diagnosis not present

## 2023-03-03 ENCOUNTER — Ambulatory Visit (INDEPENDENT_AMBULATORY_CARE_PROVIDER_SITE_OTHER): Payer: Medicare Other | Admitting: Gastroenterology

## 2023-03-03 DIAGNOSIS — D509 Iron deficiency anemia, unspecified: Secondary | ICD-10-CM | POA: Diagnosis not present

## 2023-03-03 DIAGNOSIS — K219 Gastro-esophageal reflux disease without esophagitis: Secondary | ICD-10-CM

## 2023-03-03 DIAGNOSIS — E11621 Type 2 diabetes mellitus with foot ulcer: Secondary | ICD-10-CM | POA: Diagnosis not present

## 2023-03-03 DIAGNOSIS — J449 Chronic obstructive pulmonary disease, unspecified: Secondary | ICD-10-CM | POA: Diagnosis not present

## 2023-03-03 DIAGNOSIS — L97521 Non-pressure chronic ulcer of other part of left foot limited to breakdown of skin: Secondary | ICD-10-CM | POA: Diagnosis not present

## 2023-03-03 DIAGNOSIS — R1310 Dysphagia, unspecified: Secondary | ICD-10-CM | POA: Diagnosis not present

## 2023-03-03 DIAGNOSIS — D649 Anemia, unspecified: Secondary | ICD-10-CM

## 2023-03-03 DIAGNOSIS — R1319 Other dysphagia: Secondary | ICD-10-CM

## 2023-03-03 DIAGNOSIS — I509 Heart failure, unspecified: Secondary | ICD-10-CM | POA: Diagnosis not present

## 2023-03-03 DIAGNOSIS — Z794 Long term (current) use of insulin: Secondary | ICD-10-CM | POA: Diagnosis not present

## 2023-03-03 DIAGNOSIS — I110 Hypertensive heart disease with heart failure: Secondary | ICD-10-CM | POA: Diagnosis not present

## 2023-03-03 NOTE — Patient Instructions (Signed)
Please continue with protonix 40mg  daily Continue with good reflux precautions to include avoiding greasy, spicy, tomato/citrus based foods, caffeine, chocolate, alcohol.  Stay upright 2 to 3 hours after eating prior to lying down.  Avoid eating late in the evenings.  We will get you scheduled for upper endoscopy for further evaluation of your swallowing issues.  Follow-up 3 to 4 months  It was a pleasure to see you today. I want to create trusting relationships with patients and provide genuine, compassionate, and quality care. I truly value your feedback! please be on the lookout for a survey regarding your visit with me today. I appreciate your input about our visit and your time in completing this!    Bee Marchiano L. Jeanmarie Hubert, MSN, APRN, AGNP-C Adult-Gerontology Nurse Practitioner [facility] at [facility]

## 2023-03-03 NOTE — Progress Notes (Signed)
Primary Care Physician:  [name], MD  Primary GI: Dr. Levon Hedger   Patient Location: Home   Provider Location: [facility]   Reason for Visit: follow up of GERD, dysphagia and Anemia    Persons present on the virtual encounter, with roles: Norma Ryle L. Jeanmarie Hubert, MSN, APRN, AGNP-C, Sherry Ruffing   Total time (minutes) spent on medical discussion:8 minutes  Virtual Visit via Telephone visit is conducted virtually and was requested by patient.   I connected with Sherry Ruffing on 03/03/23 at 10:15 AM EST by telephone and verified that I am speaking with the correct person using two identifiers.   I discussed the limitations, risks, security and privacy concerns of performing an evaluation and management service by telephone and the availability of in person appointments. I also discussed with the patient that there may be a patient responsible charge related to this service. The patient expressed understanding and agreed to proceed.  Chief Complaint  Patient presents with   Dysphagia    Patient doing a phone visit today. Follow up on dysphagia and GERD. Having some choking at times. Takes protonix at supper time.    Anemia    Follow up on anemia. Has not been taking iron lately. Takes sometimes.    History of Present Illness: Norma [name] is a 72 y.o. female with past medical history of bipolar disorder, iron deficiency anemia, CKD, COPD, coronary artery disease status post CABG, depression, diabetes, diabetic neuropathy, hyperlipidemia, fatty liver, gastroparesis, GERD, hypothyroidism, IBS, morbid obesity, stroke, restless leg syndrome.   Patient here today for follow-up of dysphagia and anemia.  last seen May 2024, at that time reported IV iron infusion in September but none recently.  Taking p.o. iron a few times per week.  She reported shortness of breath at baseline.  GERD well-controlled, taking Protonix 40 mg once daily.  Feeling dysphagia worse with certain  foods.  Patient recommended to have H&H, iron studies, chewing precautions, patient made me where she wished to pursue EGD, continue Protonix 40 mg daily.  Iron studies in may with TIBC 351, iron 53, sat 15%, ferritin 101, most recent hgb was 13.2 in October ([facility] tab).   Present: GERD well controlled, notes that certain foods tend to give her worse issues with dysphagia, seems to be getting worse, occurring almost daily. She is taking protonix 40mg  around dinner time which she thinks works better for her than taking in morning.   Not taking iron pills recently. States her labs every 3 months have been looking good. She stopped iron pills on her own as labs were good. She has chronic SOB, she is seeing Dr. [name] for this. She sees Dr. Wolfgang Phoenix every few months. Denies rectal bleeding or melena. Denies changes in appetite or weight loss.    Esophagram 06/2021: Age-related esophageal dysmotility. Minimal laryngeal penetration without aspiration with mild residuals noted. Otherwise negative exam. Esophageal manometry: 2018 Incomplete relaxation of LES consistent with EG outflow obstruction.  Last EGD: 2018 - Normal esophagus. - Z-line irregular, 35 cm from the incisors. - 2 cm hiatal hernia. - Portal hypertensive gastropathy. Biopsied - pathology showed mild chronic gastritis but negative for H. pylori - Erythematous mucosa in the prepyloric region of the stomach. - Normal duodenal bulb and second portion of the duodenum.  Last Colonoscopy:2018 - normal  Past Medical History:  Diagnosis Date   Anemia    Anxiety    Bipolar disorder (HCC)    Bulging lumbar disc  L3-4   Chronic daily headache    Chronic low back pain 09/20/2014   CKD (chronic kidney disease)    stage 3   COPD (chronic obstructive pulmonary disease) (HCC)    Coronary artery disease    CABG 2019   Degenerative arthritis    Depression    Diabetes mellitus, type II (HCC)    Diabetic neuropathy (HCC) 09/16/2018    DM type 2 with diabetic peripheral neuropathy (HCC) 09/20/2014   Dyslipidemia    Dyspnea    Family history of adverse reaction to anesthesia    father had reaction to anesthesia medication per patient "they don't use it anymore"- unsure of reaction   Fatty liver    per pt report   Gastroparesis    GERD (gastroesophageal reflux disease)    Heart murmur    History of hiatal hernia    pt states she no longer has this   Hypertension    Hypothyroidism    IBS (irritable bowel syndrome)    LBBB (left bundle branch block)    Memory difficulty 09/20/2014   Morbid obesity (HCC)    Myocardial infarction (HCC)    per pt- prior to CABG   Neuropathy    Obstructive sleep apnea on CPAP    Renal insufficiency    Restless legs syndrome (RLS) 09/17/2012   Stroke (cerebrum) (HCC) 05/16/2017   Left parietal   Urticaria      Past Surgical History:  Procedure Laterality Date   ABDOMINAL HYSTERECTOMY     ARTHROSCOPY KNEE W/ DRILLING  06/2011   and Decemer of 2013.   Carpel tunnel  1980's   CATARACT EXTRACTION W/PHACO Right 03/21/2017   Procedure: CATARACT EXTRACTION PHACO AND INTRAOCULAR LENS PLACEMENT RIGHT EYE;  Surgeon: Fabio Pierce, MD;  Location: [facility];  Service: Ophthalmology;  Laterality: Right;  CDE: 2.91    CATARACT EXTRACTION W/PHACO Left 04/18/2017   Procedure: CATARACT EXTRACTION PHACO AND INTRAOCULAR LENS PLACEMENT LEFT EYE;  Surgeon: Fabio Pierce, MD;  Location: [facility];  Service: Ophthalmology;  Laterality: Left;  left   CHOLECYSTECTOMY     COLONOSCOPY WITH PROPOFOL N/A 10/04/2016   Procedure: COLONOSCOPY WITH PROPOFOL;  Surgeon: Malissa Hippo, MD;  Location: [facility];  Service: Endoscopy;  Laterality: N/A;  11:10   CORONARY ARTERY BYPASS GRAFT N/A 08/29/2017   Procedure: CORONARY ARTERY BYPASS GRAFTING (CABG) x 3 WITH ENDOSCOPIC HARVESTING OF RIGHT SAPHENOUS VEIN;  Surgeon: Kerin Perna, MD;  Location: [facility] OR;  Service: Open Heart Surgery;  Laterality: N/A;    ESOPHAGOGASTRODUODENOSCOPY N/A 04/25/2016   Procedure: ESOPHAGOGASTRODUODENOSCOPY (EGD);  Surgeon: Malissa Hippo, MD;  Location: [facility];  Service: Endoscopy;  Laterality: N/A;  730   LEFT HEART CATH AND CORONARY ANGIOGRAPHY N/A 08/27/2017   Procedure: LEFT HEART CATH AND CORONARY ANGIOGRAPHY;  Surgeon: Tonny Bollman, MD;  Location: [facility] INVASIVE CV LAB;  Service: Cardiovascular::  pLAD 95% - p-mLAD 50%, ostD1 90% -pD1 80%; ostOM1 90%; rPDA 80%. EF ~50-55% - HK of dital Anterolateral & Apical wall.  - Rec CVTS c/s   NECK SURGERY     pt reports having growth removed from the back of her neck- after 2021   OPEN REDUCTION INTERNAL FIXATION (ORIF) HAND Left 04/10/2020   Procedure: OPEN REDUCTION INTERNAL FIXATION (ORIF) HAND, left index finger;  Surgeon: Vickki Hearing, MD;  Location: [facility];  Service: Orthopedics;  Laterality: Left;  0.045 k wires   RECTAL SURGERY     fissure   SHOULDER  SURGERY Left    arthroscopy in March of this year   TEE WITHOUT CARDIOVERSION N/A 08/29/2017   Procedure: TRANSESOPHAGEAL ECHOCARDIOGRAM (TEE);  Surgeon: Donata Clay, Theron Arista, MD;  Location: [facility] OR;  Service: Open Heart Surgery;  Laterality: N/A;   TRANSFORAMINAL LUMBAR INTERBODY FUSION W/ MIS 1 LEVEL Right 07/12/2021   Procedure: Minimally Invasive Surgery Transforaminal Lumbar Interbody Fusion  Lumbar four-five;  Surgeon: Bedelia Person, MD;  Location: [facility] OR;  Service: Neurosurgery;  Laterality: Right;   TRANSTHORACIC ECHOCARDIOGRAM  06/06/2017   Mild to moderate reduced EF 40 and 45%.  Anterior septal, inferoseptal and basal to mid inferior hypokinesis.  GR 1 DD.  No significant valvular lesion     Current Meds  Medication Sig   acetaminophen (TYLENOL) 325 MG tablet Take 650 mg by mouth. Takes 3 tabs daily   ALPRAZolam (XANAX) 0.25 MG tablet Take 1 tablet (0.25 mg total) by mouth at bedtime as needed for anxiety or sleep.   ARIPiprazole (ABILIFY) 5 MG tablet Take 1 tablet (5 mg total) by mouth  daily.   aspirin EC 81 MG tablet Take 1 tablet (81 mg total) by mouth daily.   azelastine (ASTELIN) 0.1 % nasal spray Place into both nostrils 2 (two) times daily. Use in each nostril as directed   buPROPion (WELLBUTRIN XL) 150 MG 24 hr tablet Take 1 tablet (150 mg total) by mouth every morning.   carvedilol (COREG) 6.25 MG tablet Take 1 tablet (6.25 mg total) by mouth 2 (two) times daily with a meal.   Cholecalciferol (VITAMIN D3) 50 MCG (2000 UT) capsule Take 1 capsule (2,000 Units total) by mouth daily with lunch.   diclofenac Sodium (VOLTAREN) 1 % GEL Apply 2 g topically 4 (four) times daily as needed (pain).   diphenhydrAMINE (BENADRYL) 25 mg capsule Take 25 mg by mouth as needed for allergies or itching.   empagliflozin (JARDIANCE) 10 MG TABS tablet Take 10 mg by mouth daily with breakfast.   EPINEPHrine 0.3 mg/0.3 mL IJ SOAJ injection Inject 0.3 mg into the muscle once.   Evolocumab (REPATHA SURECLICK) 140 MG/ML SOAJ INJECT 1 DOSE UNDER THE SKIN EVERY 14 DAYS   ferrous sulfate 325 (65 FE) MG EC tablet Take 325 mg by mouth. One daily   gabapentin (NEURONTIN) 400 MG capsule Take 400 mg by mouth in the morning. & 800 mg at bedtime   glipiZIDE (GLUCOTROL XL) 5 MG 24 hr tablet Take 1 tablet (5 mg total) by mouth daily with breakfast.   insulin glargine, 1 Unit Dial, (TOUJEO SOLOSTAR) 300 UNIT/ML Solostar Pen Inject 50 Units into the skin at bedtime.   lamoTRIgine (LAMICTAL) 150 MG tablet Take 1 tablet (150 mg total) by mouth at bedtime.   levothyroxine (SYNTHROID) 112 MCG tablet Take 112 mcg by mouth daily before breakfast.   losartan (COZAAR) 25 MG tablet Take 25 mg by mouth daily.   meclizine (ANTIVERT) 25 MG tablet Take 1 tablet (25 mg total) by mouth 3 (three) times daily as needed for dizziness. (Patient taking differently: Take 25 mg by mouth daily as needed for dizziness.)   OVER THE COUNTER MEDICATION daily as needed (pain). CBD ointment/roll on   pantoprazole (PROTONIX) 40 MG tablet  Take 1 tablet by mouth daily before supper.   rOPINIRole (REQUIP) 3 MG tablet TAKE ONE TABLET BY MOUTH EVERY MORNING, ,NOON AND AT BEDTIME. (PRT PT MORNING,EVENING,BEDTIME) (Patient taking differently: Take 3 mg by mouth 3 (three) times daily.)   tiZANidine (ZANAFLEX) 2 MG tablet Take  2 mg by mouth 2 (two) times daily.   venlafaxine XR (EFFEXOR-XR) 150 MG 24 hr capsule TAKE TWO (2) CAPSULES BY MOUTH AT BEDTIME   [DISCONTINUED] Acetaminophen (TYLENOL ARTHRITIS EXT RELIEF PO)    [DISCONTINUED] albuterol (VENTOLIN HFA) 108 (90 Base) MCG/ACT inhaler Inhale into the lungs every 6 (six) hours as needed for wheezing or shortness of breath.   [DISCONTINUED] levocetirizine (XYZAL) 5 MG tablet Take 5 mg by mouth daily.   [DISCONTINUED] mupirocin ointment (BACTROBAN) 2 %      Family History  Problem Relation Age of Onset   Hypertension Mother    Lymphoma Mother    Depression Mother    Arthritis Father    Alcohol abuse Father    Hypertension Sister    Cancer Brother        kidney and lung   Alcohol abuse Brother    Alcohol abuse Paternal Uncle    Alcohol abuse Paternal Grandfather    Alcohol abuse Paternal Grandmother    Allergic rhinitis Neg Hx    Angioedema Neg Hx    Asthma Neg Hx    Atopy Neg Hx    Eczema Neg Hx    Immunodeficiency Neg Hx    Urticaria Neg Hx     Social History   Socioeconomic History   Marital status: Single    Spouse name: Not on file   Number of children: 0   Years of education: 16   Highest education level: Not on file  Occupational History    Employer: [employer]  Tobacco Use   Smoking status: Former    Current packs/day: 0.00    Types: Cigarettes    Quit date: 02/04/1971    Years since quitting: 52.1   Smokeless tobacco: Never   Tobacco comments:    smoked 2 cigarettes a day  Vaping Use   Vaping status: Never Used  Substance and Sexual Activity   Alcohol use: No    Alcohol/week: 0.0 standard drinks of alcohol   Drug use: No   Sexual  activity: Never  Other Topics Concern   Not on file  Social History Narrative   Patient lives at home alone.    Patient has no children.    Patient has her masters in nursing.    Patient is single.    Patient drinks about 2 glasses of tea daily.   Patient is right handed.   Social Determinants of Health   Financial Resource Strain: Medium Risk (12/18/2021)   Overall Financial Resource Strain (CARDIA)    Difficulty of Paying Living Expenses: Somewhat hard  Food Insecurity: No Food Insecurity (12/18/2021)   Hunger Vital Sign    Worried About Running Out of Food in the Last Year: Never true    Ran Out of Food in the Last Year: Never true  Transportation Needs: No Transportation Needs (12/18/2021)   PRAPARE - Administrator, Civil Service (Medical): No    Lack of Transportation (Non-Medical): No  Physical Activity: Not on file  Stress: No Stress Concern Present (10/04/2021)   [employer] of Occupational Health - Occupational Stress Questionnaire    Feeling of Stress : Only a little  Social Connections: Not on file    Review of Systems: Gen: Denies fever, chills, anorexia. Denies fatigue, weakness, weight loss.  CV: Denies chest pain, palpitations, syncope, peripheral edema, and claudication. Resp: Denies dyspnea at rest, cough, wheezing, coughing up blood, and pleurisy. GI: see HPI Derm: Denies rash, itching, dry skin Psych:  Denies depression, anxiety, memory loss, confusion. No homicidal or suicidal ideation.  Heme: Denies bruising, bleeding, and enlarged lymph nodes.  Observations/Objective: No distress. Unable to perform physical exam due to telephone encounter. No video available.   Assessment and Plan: Norma [name] is a 72 y.o. female presenting virtually today for follow-up of GERD, dysphagia, IDA  GERD well-managed with Protonix 40 mg daily.  She continues to have dysphagia with thicker, drier foods noting this occurs almost daily, worse than at previous  visit.  BPE in 2023 with some age-related esophageal dysmotility however last EGD was in 2018.  At this time would recommend proceeding with EGD to rule out any other anatomic abnormalities that could be contributing to her worsening dysphagia. Indications, risks and benefits of procedure discussed in detail with patient. Patient verbalized understanding and is in agreement to proceed with EGD +/- dilation.  History of IDA, most recent labs of the past few months with stable hemoglobin, iron studies within normal limits in May 2024, she is no longer taking p.o. iron pills.  Denies any rectal bleeding or melena.  She has routine labs done with her nephrologist Dr. Wolfgang Phoenix every 3 months.  Plan: Continue Protonix 40 mg daily Good reflux precautions Schedule EGD +/- dilation ASA III  Follow up: 3-4 months    I discussed the assessment and treatment plan with the patient. The patient was provided an opportunity to ask questions and all were answered. The patient agreed with the plan and demonstrated an understanding of the instructions.   The patient was advised to call back or seek an in-person evaluation if the symptoms worsen or if the condition fails to improve as anticipated.  I provided 8 minutes of non face-to-face time during this telephone encounter   Kamile Fassler L. Jeanmarie Hubert, MSN, APRN, AGNP-C Adult-Gerontology Nurse Practitioner [facility]  I have reviewed the note and agree with the APP's assessment as described in this progress note  Katrinka Blazing, MD Gastroenterology and Hepatology [facility]

## 2023-03-05 DIAGNOSIS — E11621 Type 2 diabetes mellitus with foot ulcer: Secondary | ICD-10-CM | POA: Diagnosis not present

## 2023-03-05 DIAGNOSIS — L97521 Non-pressure chronic ulcer of other part of left foot limited to breakdown of skin: Secondary | ICD-10-CM | POA: Diagnosis not present

## 2023-03-05 DIAGNOSIS — J449 Chronic obstructive pulmonary disease, unspecified: Secondary | ICD-10-CM | POA: Diagnosis not present

## 2023-03-05 DIAGNOSIS — I110 Hypertensive heart disease with heart failure: Secondary | ICD-10-CM | POA: Diagnosis not present

## 2023-03-05 DIAGNOSIS — I509 Heart failure, unspecified: Secondary | ICD-10-CM | POA: Diagnosis not present

## 2023-03-05 DIAGNOSIS — Z794 Long term (current) use of insulin: Secondary | ICD-10-CM | POA: Diagnosis not present

## 2023-03-06 DIAGNOSIS — E11621 Type 2 diabetes mellitus with foot ulcer: Secondary | ICD-10-CM | POA: Diagnosis not present

## 2023-03-06 DIAGNOSIS — Z794 Long term (current) use of insulin: Secondary | ICD-10-CM | POA: Diagnosis not present

## 2023-03-06 DIAGNOSIS — I110 Hypertensive heart disease with heart failure: Secondary | ICD-10-CM | POA: Diagnosis not present

## 2023-03-06 DIAGNOSIS — L97521 Non-pressure chronic ulcer of other part of left foot limited to breakdown of skin: Secondary | ICD-10-CM | POA: Diagnosis not present

## 2023-03-06 DIAGNOSIS — I509 Heart failure, unspecified: Secondary | ICD-10-CM | POA: Diagnosis not present

## 2023-03-06 DIAGNOSIS — J449 Chronic obstructive pulmonary disease, unspecified: Secondary | ICD-10-CM | POA: Diagnosis not present

## 2023-03-10 DIAGNOSIS — Z794 Long term (current) use of insulin: Secondary | ICD-10-CM | POA: Diagnosis not present

## 2023-03-10 DIAGNOSIS — M25562 Pain in left knee: Secondary | ICD-10-CM | POA: Diagnosis not present

## 2023-03-10 DIAGNOSIS — G8929 Other chronic pain: Secondary | ICD-10-CM | POA: Diagnosis not present

## 2023-03-10 DIAGNOSIS — M25561 Pain in right knee: Secondary | ICD-10-CM | POA: Diagnosis not present

## 2023-03-10 DIAGNOSIS — I110 Hypertensive heart disease with heart failure: Secondary | ICD-10-CM | POA: Diagnosis not present

## 2023-03-10 DIAGNOSIS — M5416 Radiculopathy, lumbar region: Secondary | ICD-10-CM | POA: Diagnosis not present

## 2023-03-10 DIAGNOSIS — J449 Chronic obstructive pulmonary disease, unspecified: Secondary | ICD-10-CM | POA: Diagnosis not present

## 2023-03-10 DIAGNOSIS — E11621 Type 2 diabetes mellitus with foot ulcer: Secondary | ICD-10-CM | POA: Diagnosis not present

## 2023-03-10 DIAGNOSIS — I509 Heart failure, unspecified: Secondary | ICD-10-CM | POA: Diagnosis not present

## 2023-03-10 DIAGNOSIS — Z6841 Body Mass Index (BMI) 40.0 and over, adult: Secondary | ICD-10-CM | POA: Diagnosis not present

## 2023-03-10 DIAGNOSIS — L97521 Non-pressure chronic ulcer of other part of left foot limited to breakdown of skin: Secondary | ICD-10-CM | POA: Diagnosis not present

## 2023-03-11 DIAGNOSIS — I509 Heart failure, unspecified: Secondary | ICD-10-CM | POA: Diagnosis not present

## 2023-03-11 DIAGNOSIS — I110 Hypertensive heart disease with heart failure: Secondary | ICD-10-CM | POA: Diagnosis not present

## 2023-03-11 DIAGNOSIS — Z794 Long term (current) use of insulin: Secondary | ICD-10-CM | POA: Diagnosis not present

## 2023-03-11 DIAGNOSIS — L97521 Non-pressure chronic ulcer of other part of left foot limited to breakdown of skin: Secondary | ICD-10-CM | POA: Diagnosis not present

## 2023-03-11 DIAGNOSIS — E11621 Type 2 diabetes mellitus with foot ulcer: Secondary | ICD-10-CM | POA: Diagnosis not present

## 2023-03-11 DIAGNOSIS — J449 Chronic obstructive pulmonary disease, unspecified: Secondary | ICD-10-CM | POA: Diagnosis not present

## 2023-03-12 ENCOUNTER — Other Ambulatory Visit: Payer: Self-pay

## 2023-03-12 DIAGNOSIS — E11621 Type 2 diabetes mellitus with foot ulcer: Secondary | ICD-10-CM | POA: Diagnosis not present

## 2023-03-12 DIAGNOSIS — I110 Hypertensive heart disease with heart failure: Secondary | ICD-10-CM | POA: Diagnosis not present

## 2023-03-12 DIAGNOSIS — I509 Heart failure, unspecified: Secondary | ICD-10-CM | POA: Diagnosis not present

## 2023-03-12 DIAGNOSIS — L97521 Non-pressure chronic ulcer of other part of left foot limited to breakdown of skin: Secondary | ICD-10-CM | POA: Diagnosis not present

## 2023-03-12 DIAGNOSIS — Z794 Long term (current) use of insulin: Secondary | ICD-10-CM | POA: Diagnosis not present

## 2023-03-12 DIAGNOSIS — J449 Chronic obstructive pulmonary disease, unspecified: Secondary | ICD-10-CM | POA: Diagnosis not present

## 2023-03-12 MED ORDER — EMPAGLIFLOZIN 10 MG PO TABS: 10.0000 mg | ORAL_TABLET | Freq: Every day | ORAL | 3 refills | Status: DC

## 2023-03-15 DIAGNOSIS — Z9981 Dependence on supplemental oxygen: Secondary | ICD-10-CM | POA: Diagnosis not present

## 2023-03-15 DIAGNOSIS — E039 Hypothyroidism, unspecified: Secondary | ICD-10-CM | POA: Diagnosis not present

## 2023-03-15 DIAGNOSIS — Z7982 Long term (current) use of aspirin: Secondary | ICD-10-CM | POA: Diagnosis not present

## 2023-03-15 DIAGNOSIS — E1140 Type 2 diabetes mellitus with diabetic neuropathy, unspecified: Secondary | ICD-10-CM | POA: Diagnosis not present

## 2023-03-15 DIAGNOSIS — J449 Chronic obstructive pulmonary disease, unspecified: Secondary | ICD-10-CM | POA: Diagnosis not present

## 2023-03-15 DIAGNOSIS — Z7984 Long term (current) use of oral hypoglycemic drugs: Secondary | ICD-10-CM | POA: Diagnosis not present

## 2023-03-15 DIAGNOSIS — Z87891 Personal history of nicotine dependence: Secondary | ICD-10-CM | POA: Diagnosis not present

## 2023-03-15 DIAGNOSIS — E11621 Type 2 diabetes mellitus with foot ulcer: Secondary | ICD-10-CM | POA: Diagnosis not present

## 2023-03-15 DIAGNOSIS — I509 Heart failure, unspecified: Secondary | ICD-10-CM | POA: Diagnosis not present

## 2023-03-15 DIAGNOSIS — Z794 Long term (current) use of insulin: Secondary | ICD-10-CM | POA: Diagnosis not present

## 2023-03-15 DIAGNOSIS — I110 Hypertensive heart disease with heart failure: Secondary | ICD-10-CM | POA: Diagnosis not present

## 2023-03-15 DIAGNOSIS — L97521 Non-pressure chronic ulcer of other part of left foot limited to breakdown of skin: Secondary | ICD-10-CM | POA: Diagnosis not present

## 2023-03-15 DIAGNOSIS — D1724 Benign lipomatous neoplasm of skin and subcutaneous tissue of left leg: Secondary | ICD-10-CM | POA: Diagnosis not present

## 2023-03-15 DIAGNOSIS — M109 Gout, unspecified: Secondary | ICD-10-CM | POA: Diagnosis not present

## 2023-03-15 DIAGNOSIS — Z8673 Personal history of transient ischemic attack (TIA), and cerebral infarction without residual deficits: Secondary | ICD-10-CM | POA: Diagnosis not present

## 2023-03-15 DIAGNOSIS — Z604 Social exclusion and rejection: Secondary | ICD-10-CM | POA: Diagnosis not present

## 2023-03-16 HISTORY — PX: ESOPHAGOGASTRODUODENOSCOPY: SHX1529

## 2023-03-17 DIAGNOSIS — I509 Heart failure, unspecified: Secondary | ICD-10-CM | POA: Diagnosis not present

## 2023-03-17 DIAGNOSIS — J449 Chronic obstructive pulmonary disease, unspecified: Secondary | ICD-10-CM | POA: Diagnosis not present

## 2023-03-17 DIAGNOSIS — E11621 Type 2 diabetes mellitus with foot ulcer: Secondary | ICD-10-CM | POA: Diagnosis not present

## 2023-03-17 DIAGNOSIS — I110 Hypertensive heart disease with heart failure: Secondary | ICD-10-CM | POA: Diagnosis not present

## 2023-03-17 DIAGNOSIS — Z794 Long term (current) use of insulin: Secondary | ICD-10-CM | POA: Diagnosis not present

## 2023-03-17 DIAGNOSIS — L97521 Non-pressure chronic ulcer of other part of left foot limited to breakdown of skin: Secondary | ICD-10-CM | POA: Diagnosis not present

## 2023-03-19 DIAGNOSIS — J449 Chronic obstructive pulmonary disease, unspecified: Secondary | ICD-10-CM | POA: Diagnosis not present

## 2023-03-19 DIAGNOSIS — I509 Heart failure, unspecified: Secondary | ICD-10-CM | POA: Diagnosis not present

## 2023-03-19 DIAGNOSIS — L97521 Non-pressure chronic ulcer of other part of left foot limited to breakdown of skin: Secondary | ICD-10-CM | POA: Diagnosis not present

## 2023-03-19 DIAGNOSIS — Z951 Presence of aortocoronary bypass graft: Secondary | ICD-10-CM | POA: Diagnosis not present

## 2023-03-19 DIAGNOSIS — Z791 Long term (current) use of non-steroidal anti-inflammatories (NSAID): Secondary | ICD-10-CM | POA: Diagnosis not present

## 2023-03-19 DIAGNOSIS — E11621 Type 2 diabetes mellitus with foot ulcer: Secondary | ICD-10-CM | POA: Diagnosis not present

## 2023-03-19 DIAGNOSIS — Z8673 Personal history of transient ischemic attack (TIA), and cerebral infarction without residual deficits: Secondary | ICD-10-CM | POA: Diagnosis not present

## 2023-03-19 DIAGNOSIS — Z90710 Acquired absence of both cervix and uterus: Secondary | ICD-10-CM | POA: Diagnosis not present

## 2023-03-19 DIAGNOSIS — Z87891 Personal history of nicotine dependence: Secondary | ICD-10-CM | POA: Diagnosis not present

## 2023-03-19 DIAGNOSIS — I110 Hypertensive heart disease with heart failure: Secondary | ICD-10-CM | POA: Diagnosis not present

## 2023-03-19 DIAGNOSIS — Z794 Long term (current) use of insulin: Secondary | ICD-10-CM | POA: Diagnosis not present

## 2023-03-19 DIAGNOSIS — Z7982 Long term (current) use of aspirin: Secondary | ICD-10-CM | POA: Diagnosis not present

## 2023-03-19 DIAGNOSIS — E079 Disorder of thyroid, unspecified: Secondary | ICD-10-CM | POA: Diagnosis not present

## 2023-03-19 DIAGNOSIS — Z79899 Other long term (current) drug therapy: Secondary | ICD-10-CM | POA: Diagnosis not present

## 2023-03-19 DIAGNOSIS — E1140 Type 2 diabetes mellitus with diabetic neuropathy, unspecified: Secondary | ICD-10-CM | POA: Diagnosis not present

## 2023-03-19 DIAGNOSIS — Z7984 Long term (current) use of oral hypoglycemic drugs: Secondary | ICD-10-CM | POA: Diagnosis not present

## 2023-03-21 DIAGNOSIS — I509 Heart failure, unspecified: Secondary | ICD-10-CM | POA: Diagnosis not present

## 2023-03-21 DIAGNOSIS — E11621 Type 2 diabetes mellitus with foot ulcer: Secondary | ICD-10-CM | POA: Diagnosis not present

## 2023-03-21 DIAGNOSIS — L97521 Non-pressure chronic ulcer of other part of left foot limited to breakdown of skin: Secondary | ICD-10-CM | POA: Diagnosis not present

## 2023-03-21 DIAGNOSIS — J449 Chronic obstructive pulmonary disease, unspecified: Secondary | ICD-10-CM | POA: Diagnosis not present

## 2023-03-21 DIAGNOSIS — I110 Hypertensive heart disease with heart failure: Secondary | ICD-10-CM | POA: Diagnosis not present

## 2023-03-21 DIAGNOSIS — Z794 Long term (current) use of insulin: Secondary | ICD-10-CM | POA: Diagnosis not present

## 2023-03-24 DIAGNOSIS — J449 Chronic obstructive pulmonary disease, unspecified: Secondary | ICD-10-CM | POA: Diagnosis not present

## 2023-03-24 DIAGNOSIS — I110 Hypertensive heart disease with heart failure: Secondary | ICD-10-CM | POA: Diagnosis not present

## 2023-03-24 DIAGNOSIS — Z794 Long term (current) use of insulin: Secondary | ICD-10-CM | POA: Diagnosis not present

## 2023-03-24 DIAGNOSIS — E11621 Type 2 diabetes mellitus with foot ulcer: Secondary | ICD-10-CM | POA: Diagnosis not present

## 2023-03-24 DIAGNOSIS — I509 Heart failure, unspecified: Secondary | ICD-10-CM | POA: Diagnosis not present

## 2023-03-24 DIAGNOSIS — L97521 Non-pressure chronic ulcer of other part of left foot limited to breakdown of skin: Secondary | ICD-10-CM | POA: Diagnosis not present

## 2023-03-27 DIAGNOSIS — J449 Chronic obstructive pulmonary disease, unspecified: Secondary | ICD-10-CM | POA: Diagnosis not present

## 2023-03-27 DIAGNOSIS — I110 Hypertensive heart disease with heart failure: Secondary | ICD-10-CM | POA: Diagnosis not present

## 2023-03-27 DIAGNOSIS — I509 Heart failure, unspecified: Secondary | ICD-10-CM | POA: Diagnosis not present

## 2023-03-27 DIAGNOSIS — Z794 Long term (current) use of insulin: Secondary | ICD-10-CM | POA: Diagnosis not present

## 2023-03-27 DIAGNOSIS — L97521 Non-pressure chronic ulcer of other part of left foot limited to breakdown of skin: Secondary | ICD-10-CM | POA: Diagnosis not present

## 2023-03-27 DIAGNOSIS — E11621 Type 2 diabetes mellitus with foot ulcer: Secondary | ICD-10-CM | POA: Diagnosis not present

## 2023-03-31 DIAGNOSIS — Z794 Long term (current) use of insulin: Secondary | ICD-10-CM | POA: Diagnosis not present

## 2023-03-31 DIAGNOSIS — I110 Hypertensive heart disease with heart failure: Secondary | ICD-10-CM | POA: Diagnosis not present

## 2023-03-31 DIAGNOSIS — L97521 Non-pressure chronic ulcer of other part of left foot limited to breakdown of skin: Secondary | ICD-10-CM | POA: Diagnosis not present

## 2023-03-31 DIAGNOSIS — J449 Chronic obstructive pulmonary disease, unspecified: Secondary | ICD-10-CM | POA: Diagnosis not present

## 2023-03-31 DIAGNOSIS — E11621 Type 2 diabetes mellitus with foot ulcer: Secondary | ICD-10-CM | POA: Diagnosis not present

## 2023-03-31 DIAGNOSIS — I509 Heart failure, unspecified: Secondary | ICD-10-CM | POA: Diagnosis not present

## 2023-04-02 ENCOUNTER — Telehealth: Payer: Self-pay | Admitting: Pulmonary Disease

## 2023-04-02 DIAGNOSIS — L97521 Non-pressure chronic ulcer of other part of left foot limited to breakdown of skin: Secondary | ICD-10-CM | POA: Diagnosis not present

## 2023-04-02 DIAGNOSIS — I509 Heart failure, unspecified: Secondary | ICD-10-CM | POA: Diagnosis not present

## 2023-04-02 DIAGNOSIS — I110 Hypertensive heart disease with heart failure: Secondary | ICD-10-CM | POA: Diagnosis not present

## 2023-04-02 DIAGNOSIS — E1140 Type 2 diabetes mellitus with diabetic neuropathy, unspecified: Secondary | ICD-10-CM | POA: Diagnosis not present

## 2023-04-02 DIAGNOSIS — E11621 Type 2 diabetes mellitus with foot ulcer: Secondary | ICD-10-CM | POA: Diagnosis not present

## 2023-04-02 DIAGNOSIS — J449 Chronic obstructive pulmonary disease, unspecified: Secondary | ICD-10-CM | POA: Diagnosis not present

## 2023-04-03 DIAGNOSIS — J449 Chronic obstructive pulmonary disease, unspecified: Secondary | ICD-10-CM | POA: Diagnosis not present

## 2023-04-03 DIAGNOSIS — I509 Heart failure, unspecified: Secondary | ICD-10-CM | POA: Diagnosis not present

## 2023-04-03 DIAGNOSIS — E11621 Type 2 diabetes mellitus with foot ulcer: Secondary | ICD-10-CM | POA: Diagnosis not present

## 2023-04-03 DIAGNOSIS — Z794 Long term (current) use of insulin: Secondary | ICD-10-CM | POA: Diagnosis not present

## 2023-04-03 DIAGNOSIS — L97521 Non-pressure chronic ulcer of other part of left foot limited to breakdown of skin: Secondary | ICD-10-CM | POA: Diagnosis not present

## 2023-04-03 DIAGNOSIS — I110 Hypertensive heart disease with heart failure: Secondary | ICD-10-CM | POA: Diagnosis not present

## 2023-04-04 DIAGNOSIS — L97521 Non-pressure chronic ulcer of other part of left foot limited to breakdown of skin: Secondary | ICD-10-CM | POA: Diagnosis not present

## 2023-04-04 DIAGNOSIS — I509 Heart failure, unspecified: Secondary | ICD-10-CM | POA: Diagnosis not present

## 2023-04-04 DIAGNOSIS — Z794 Long term (current) use of insulin: Secondary | ICD-10-CM | POA: Diagnosis not present

## 2023-04-04 DIAGNOSIS — E11621 Type 2 diabetes mellitus with foot ulcer: Secondary | ICD-10-CM | POA: Diagnosis not present

## 2023-04-04 DIAGNOSIS — J449 Chronic obstructive pulmonary disease, unspecified: Secondary | ICD-10-CM | POA: Diagnosis not present

## 2023-04-04 DIAGNOSIS — I110 Hypertensive heart disease with heart failure: Secondary | ICD-10-CM | POA: Diagnosis not present

## 2023-04-04 NOTE — Patient Instructions (Signed)
Norma [name]  04/04/2023     @PREFPERIOPPHARMACY @   Your procedure is scheduled on 04/10/2023.   Report to [facility] at  0900  A.M.    Call this number if you have problems the morning of surgery:  [phone]  If you experience any cold or flu symptoms such as cough, fever, chills, shortness of breath, etc. between now and your scheduled surgery, please notify us at the above number.   Remember:        Your last dose of jardiance should be on 04/06/2023.        Take 1/2 (25 units) of your night time insulin the night before your procedure.      DO NOT take any medications for diabetes the morning of your procedure.    Follow the diet instructions given to you by the office.     You may drink clear liquids until 0700 am on 04/10/2023.    Clear liquids allowed are:                    Water, Juice (No red color; non-citric and without pulp; diabetics please choose diet or no sugar options), Carbonated beverages (diabetics please choose diet or no sugar options), Clear Tea (No creamer, milk, or cream, including half & half and powdered creamer), Black Coffee Only (No creamer, milk or cream, including half & half and powdered creamer), and Clear Sports drink (No red color; diabetics please choose diet or no sugar options)    Take these medicines the morning of surgery with A SIP OF WATER           aripiprazole, bupropion, carvedilol, gabapentin, levothyroxine, meclizine (if needed), pantoprazole, tizanidine (if needed).     Do not wear jewelry, make-up or nail polish, including gel polish,  artificial nails, or any other type of covering on natural nails (fingers and  toes).  Do not wear lotions, powders, or perfumes, or deodorant.  Do not shave 48 hours prior to surgery.  Men may shave face and neck.  Do not bring valuables to the hospital.  [facility] is not responsible for any belongings or valuables.  Contacts, dentures or bridgework may not be worn  into surgery.  Leave your suitcase in the car.  After surgery it may be brought to your room.  For patients admitted to the hospital, discharge time will be determined by your treatment team.  Patients discharged the day of surgery will not be allowed to drive home and must have someone with them for 24 hours.    Special instructions:   DO NOT smoke tobacco or vape for 24 hours before your procedure.  Please read over the following fact sheets that you were given. Anesthesia Post-op Instructions and Care and Recovery After Surgery      Upper Endoscopy, Adult, Care After After the procedure, it is common to have a sore throat. It is also common to have: Mild stomach pain or discomfort. Bloating. Nausea. Follow these instructions at home: The instructions below may help you care for yourself at home. Your health care provider may give you more instructions. If you have questions, ask your health care provider. If you were given a sedative during the procedure, it can affect you for several hours. Do not drive or operate machinery until your health care provider says that it is safe. If you will be going home right after the procedure, plan to have a  responsible adult: Take you home from the hospital or clinic. You will not be allowed to drive. Care for you for the time you are told. Follow instructions from your health care provider about what you may eat and drink. Return to your normal activities as told by your health care provider. Ask your health care provider what activities are safe for you. Take over-the-counter and prescription medicines only as told by your health care provider. Contact a health care provider if you: Have a sore throat that lasts longer than one day. Have trouble swallowing. Have a fever. Get help right away if you: Vomit blood or your vomit looks like coffee grounds. Have bloody, black, or tarry stools. Have a very bad sore throat or you cannot  swallow. Have difficulty breathing or very bad pain in your chest or abdomen. These symptoms may be an emergency. Get help right away. Call 911. Do not wait to see if the symptoms will go away. Do not drive yourself to the hospital. Summary After the procedure, it is common to have a sore throat, mild stomach discomfort, bloating, and nausea. If you were given a sedative during the procedure, it can affect you for several hours. Do not drive until your health care provider says that it is safe. Follow instructions from your health care provider about what you may eat and drink. Return to your normal activities as told by your health care provider. This information is not intended to replace advice given to you by your health care provider. Make sure you discuss any questions you have with your health care provider. Document Revised: 07/11/2021 Document Reviewed: 07/11/2021 Elsevier Patient Education  2024 [employer] Esophageal Dilatation Esophageal dilatation, also called esophageal dilation, is a procedure to widen or open a blocked or narrowed part of the esophagus. The esophagus is the part of the body that moves food and liquid from the mouth to the stomach. You may need this procedure if: You have a buildup of scar tissue in your esophagus that makes it difficult, painful, or impossible to swallow. This can be caused by gastroesophageal reflux disease (GERD). You have cancer of the esophagus. There is a problem with how food moves through your esophagus. In some cases, you may need this procedure repeated at a later time to dilate the esophagus gradually. Tell a health care provider about: Any allergies you have. All medicines you are taking, including vitamins, herbs, eye drops, creams, and over-the-counter medicines. Any problems you or family members have had with anesthetic medicines. Any blood disorders you have. Any surgeries you have had. Any medical conditions you have. Any  antibiotic medicines you are required to take before dental procedures. Whether you are pregnant or may be pregnant. What are the risks? Generally, this is a safe procedure. However, problems may occur, including: Bleeding due to a tear in the lining of the esophagus. A hole, or perforation, in the esophagus. What happens before the procedure? Ask your health care provider about: Changing or stopping your regular medicines. This is especially important if you are taking diabetes medicines or blood thinners. Taking medicines such as aspirin and ibuprofen. These medicines can thin your blood. Do not take these medicines unless your health care provider tells you to take them. Taking over-the-counter medicines, vitamins, herbs, and supplements. Follow instructions from your health care provider about eating or drinking restrictions. Plan to have a responsible adult take you home from the hospital or clinic. Plan to have a responsible adult care  for you for the time you are told after you leave the hospital or clinic. This is important. What happens during the procedure? You may be given a medicine to help you relax (sedative). A numbing medicine may be sprayed into the back of your throat, or you may gargle the medicine. Your health care provider may perform the dilatation using various surgical instruments, such as: Simple dilators. This instrument is carefully placed in the esophagus to stretch it. Guided wire bougies. This involves using an endoscope to insert a wire into the esophagus. A dilator is passed over this wire to enlarge the esophagus. Then the wire is removed. Balloon dilators. An endoscope with a small balloon is inserted into the esophagus. The balloon is inflated to stretch the esophagus and open it up. The procedure may vary among health care providers and hospitals. What can I expect after the procedure? Your blood pressure, heart rate, breathing rate, and blood oxygen level  will be monitored until you leave the hospital or clinic. Your throat may feel slightly sore and numb. This will get better over time. You will not be allowed to eat or drink until your throat is no longer numb. When you are able to drink, urinate, and sit on the edge of the bed without nausea or dizziness, you may be able to return home. Follow these instructions at home: Take over-the-counter and prescription medicines only as told by your health care provider. If you were given a sedative during the procedure, it can affect you for several hours. Do not drive or operate machinery until your health care provider says that it is safe. Plan to have a responsible adult care for you for the time you are told. This is important. Follow instructions from your health care provider about any eating or drinking restrictions. Do not use any products that contain nicotine or tobacco, such as cigarettes, e-cigarettes, and chewing tobacco. If you need help quitting, ask your health care provider. Keep all follow-up visits. This is important. Contact a health care provider if: You have a fever. You have pain that is not relieved by medicine. Get help right away if: You have chest pain. You have trouble breathing. You have trouble swallowing. You vomit blood. You have black, tarry, or bloody stools. These symptoms may represent a serious problem that is an emergency. Do not wait to see if the symptoms will go away. Get medical help right away. Call your local emergency services (911 in the [place] Do not drive yourself to the hospital. Summary Esophageal dilatation, also called esophageal dilation, is a procedure to widen or open a blocked or narrowed part of the esophagus. Plan to have a responsible adult take you home from the hospital or clinic. For this procedure, a numbing medicine may be sprayed into the back of your throat, or you may gargle the medicine. Do not drive or operate machinery until  your health care provider says that it is safe. This information is not intended to replace advice given to you by your health care provider. Make sure you discuss any questions you have with your health care provider. Document Revised: 08/18/2019 Document Reviewed: 08/18/2019 Elsevier Patient Education  2024 [employer] Monitored Anesthesia Care, Care After The following information offers guidance on how to care for yourself after your procedure. Your health care provider may also give you more specific instructions. If you have problems or questions, contact your health care provider. What can I expect after the procedure? After the  procedure, it is common to have: Tiredness. Little or no memory about what happened during or after the procedure. Impaired judgment when it comes to making decisions. Nausea or vomiting. Some trouble with balance. Follow these instructions at home: For the time period you were told by your health care provider:  Rest. Do not participate in activities where you could fall or become injured. Do not drive or use machinery. Do not drink alcohol. Do not take sleeping pills or medicines that cause drowsiness. Do not make important decisions or sign legal documents. Do not take care of children on your own. Medicines Take over-the-counter and prescription medicines only as told by your health care provider. If you were prescribed antibiotics, take them as told by your health care provider. Do not stop using the antibiotic even if you start to feel better. Eating and drinking Follow instructions from your health care provider about what you may eat and drink. Drink enough fluid to keep your urine pale yellow. If you vomit: Drink clear fluids slowly and in small amounts as you are able. Clear fluids include water, ice chips, low-calorie sports drinks, and fruit juice that has water added to it (diluted fruit juice). Eat light and bland foods in small amounts  as you are able. These foods include bananas, applesauce, rice, lean meats, toast, and crackers. General instructions  Have a responsible adult stay with you for the time you are told. It is important to have someone help care for you until you are awake and alert. If you have sleep apnea, surgery and some medicines can increase your risk for breathing problems. Follow instructions from your health care provider about wearing your sleep device: When you are sleeping. This includes during daytime naps. While taking prescription pain medicines, sleeping medicines, or medicines that make you drowsy. Do not use any products that contain nicotine or tobacco. These products include cigarettes, chewing tobacco, and vaping devices, such as e-cigarettes. If you need help quitting, ask your health care provider. Contact a health care provider if: You feel nauseous or vomit every time you eat or drink. You feel light-headed. You are still sleepy or having trouble with balance after 24 hours. You get a rash. You have a fever. You have redness or swelling around the IV site. Get help right away if: You have trouble breathing. You have new confusion after you get home. These symptoms may be an emergency. Get help right away. Call 911. Do not wait to see if the symptoms will go away. Do not drive yourself to the hospital. This information is not intended to replace advice given to you by your health care provider. Make sure you discuss any questions you have with your health care provider. Document Revised: 08/27/2021 Document Reviewed: 08/27/2021 Elsevier Patient Education  2024 [employer].

## 2023-04-07 ENCOUNTER — Encounter (HOSPITAL_COMMUNITY)
Admission: RE | Admit: 2023-04-07 | Discharge: 2023-04-07 | Disposition: A | Discharge status: Home / Self Care | Payer: Medicare Other | Source: Ambulatory Visit | Attending: Gastroenterology | Admitting: Gastroenterology

## 2023-04-07 ENCOUNTER — Encounter (HOSPITAL_COMMUNITY): Payer: Self-pay

## 2023-04-07 VITALS — Ht 60.0 in | Wt 263.0 lb

## 2023-04-07 DIAGNOSIS — L97521 Non-pressure chronic ulcer of other part of left foot limited to breakdown of skin: Secondary | ICD-10-CM | POA: Diagnosis not present

## 2023-04-07 DIAGNOSIS — E611 Iron deficiency: Secondary | ICD-10-CM

## 2023-04-07 DIAGNOSIS — I509 Heart failure, unspecified: Secondary | ICD-10-CM | POA: Diagnosis not present

## 2023-04-07 DIAGNOSIS — E1159 Type 2 diabetes mellitus with other circulatory complications: Secondary | ICD-10-CM

## 2023-04-07 DIAGNOSIS — E11621 Type 2 diabetes mellitus with foot ulcer: Secondary | ICD-10-CM | POA: Diagnosis not present

## 2023-04-07 DIAGNOSIS — Z794 Long term (current) use of insulin: Secondary | ICD-10-CM | POA: Diagnosis not present

## 2023-04-07 DIAGNOSIS — I110 Hypertensive heart disease with heart failure: Secondary | ICD-10-CM | POA: Diagnosis not present

## 2023-04-07 DIAGNOSIS — J449 Chronic obstructive pulmonary disease, unspecified: Secondary | ICD-10-CM | POA: Diagnosis not present

## 2023-04-10 ENCOUNTER — Ambulatory Visit (HOSPITAL_COMMUNITY): Payer: Medicare Other | Admitting: Anesthesiology

## 2023-04-10 ENCOUNTER — Ambulatory Visit (HOSPITAL_BASED_OUTPATIENT_CLINIC_OR_DEPARTMENT_OTHER): Payer: Medicare Other | Admitting: Anesthesiology

## 2023-04-10 ENCOUNTER — Ambulatory Visit (HOSPITAL_COMMUNITY)
Admission: RE | Admit: 2023-04-10 | Discharge: 2023-04-10 | Disposition: A | Discharge status: Home / Self Care | Payer: Medicare Other | Attending: Gastroenterology | Admitting: Gastroenterology

## 2023-04-10 ENCOUNTER — Encounter (HOSPITAL_COMMUNITY)
Admission: RE | Disposition: A | Discharge status: Home / Self Care | Payer: Self-pay | Source: Home / Self Care | Attending: Gastroenterology

## 2023-04-10 ENCOUNTER — Encounter (HOSPITAL_COMMUNITY): Payer: Self-pay | Admitting: Gastroenterology

## 2023-04-10 DIAGNOSIS — R1310 Dysphagia, unspecified: Secondary | ICD-10-CM | POA: Insufficient documentation

## 2023-04-10 DIAGNOSIS — K2950 Unspecified chronic gastritis without bleeding: Secondary | ICD-10-CM | POA: Diagnosis not present

## 2023-04-10 DIAGNOSIS — J449 Chronic obstructive pulmonary disease, unspecified: Secondary | ICD-10-CM | POA: Diagnosis not present

## 2023-04-10 DIAGNOSIS — K319 Disease of stomach and duodenum, unspecified: Secondary | ICD-10-CM | POA: Diagnosis not present

## 2023-04-10 DIAGNOSIS — K449 Diaphragmatic hernia without obstruction or gangrene: Secondary | ICD-10-CM | POA: Insufficient documentation

## 2023-04-10 DIAGNOSIS — Z8249 Family history of ischemic heart disease and other diseases of the circulatory system: Secondary | ICD-10-CM | POA: Diagnosis not present

## 2023-04-10 DIAGNOSIS — Z794 Long term (current) use of insulin: Secondary | ICD-10-CM | POA: Diagnosis not present

## 2023-04-10 DIAGNOSIS — K3189 Other diseases of stomach and duodenum: Secondary | ICD-10-CM | POA: Diagnosis not present

## 2023-04-10 DIAGNOSIS — Z7984 Long term (current) use of oral hypoglycemic drugs: Secondary | ICD-10-CM | POA: Insufficient documentation

## 2023-04-10 DIAGNOSIS — E1122 Type 2 diabetes mellitus with diabetic chronic kidney disease: Secondary | ICD-10-CM | POA: Diagnosis not present

## 2023-04-10 DIAGNOSIS — I2510 Atherosclerotic heart disease of native coronary artery without angina pectoris: Secondary | ICD-10-CM | POA: Diagnosis not present

## 2023-04-10 DIAGNOSIS — I129 Hypertensive chronic kidney disease with stage 1 through stage 4 chronic kidney disease, or unspecified chronic kidney disease: Secondary | ICD-10-CM | POA: Diagnosis not present

## 2023-04-10 DIAGNOSIS — K317 Polyp of stomach and duodenum: Secondary | ICD-10-CM | POA: Diagnosis not present

## 2023-04-10 DIAGNOSIS — E1159 Type 2 diabetes mellitus with other circulatory complications: Secondary | ICD-10-CM

## 2023-04-10 DIAGNOSIS — N183 Chronic kidney disease, stage 3 unspecified: Secondary | ICD-10-CM | POA: Diagnosis not present

## 2023-04-10 DIAGNOSIS — Z87891 Personal history of nicotine dependence: Secondary | ICD-10-CM | POA: Diagnosis not present

## 2023-04-10 DIAGNOSIS — E611 Iron deficiency: Secondary | ICD-10-CM

## 2023-04-10 LAB — BASIC METABOLIC PANEL
Anion gap: 9 (ref 5–15)
BUN: 25 mg/dL — ABNORMAL HIGH (ref 8–23)
CO2: 25 mmol/L (ref 22–32)
Calcium: 8.5 mg/dL — ABNORMAL LOW (ref 8.9–10.3)
Chloride: 103 mmol/L (ref 98–111)
Creatinine, Ser: 1.28 mg/dL — ABNORMAL HIGH (ref 0.44–1.00)
GFR, Estimated: 45 mL/min — ABNORMAL LOW (ref >60)
Glucose, Bld: 186 mg/dL — ABNORMAL HIGH (ref 70–99)
Potassium: 4.1 mmol/L (ref 3.5–5.1)
Sodium: 137 mmol/L (ref 135–145)

## 2023-04-10 LAB — CBC WITH DIFFERENTIAL/PLATELET
Abs Immature Granulocytes: 0.05 10*3/uL (ref 0.00–0.07)
Basophils Absolute: 0.1 10*3/uL (ref 0.0–0.1)
Basophils Relative: 1 %
Eosinophils Absolute: 0.4 10*3/uL (ref 0.0–0.5)
Eosinophils Relative: 4 %
HCT: 40.3 % (ref 36.0–46.0)
Hemoglobin: 13.2 g/dL (ref 12.0–15.0)
Immature Granulocytes: 1 %
Lymphocytes Relative: 15 %
Lymphs Abs: 1.4 10*3/uL (ref 0.7–4.0)
MCH: 31.8 pg (ref 26.0–34.0)
MCHC: 32.8 g/dL (ref 30.0–36.0)
MCV: 97.1 fL (ref 80.0–100.0)
Monocytes Absolute: 0.8 10*3/uL (ref 0.1–1.0)
Monocytes Relative: 9 %
Neutro Abs: 7 10*3/uL (ref 1.7–7.7)
Neutrophils Relative %: 70 %
Platelets: 257 10*3/uL (ref 150–400)
RBC: 4.15 MIL/uL (ref 3.87–5.11)
RDW: 13.2 % (ref 11.5–15.5)
WBC: 9.7 10*3/uL (ref 4.0–10.5)
nRBC: 0 % (ref 0.0–0.2)

## 2023-04-10 LAB — KOH PREP: KOH Prep: NONE SEEN

## 2023-04-10 LAB — GLUCOSE, CAPILLARY: Glucose-Capillary: 172 mg/dL — ABNORMAL HIGH (ref 70–99)

## 2023-04-10 SURGERY — ESOPHAGOGASTRODUODENOSCOPY (EGD) WITH PROPOFOL
Anesthesia: General

## 2023-04-10 MED ORDER — LIDOCAINE HCL (PF) 2 % IJ SOLN
INTRAMUSCULAR | Status: DC | PRN
  Administered 2023-04-10: 100 mg via INTRADERMAL

## 2023-04-10 MED ORDER — LACTATED RINGERS IV SOLN: INTRAVENOUS | Status: DC | PRN

## 2023-04-10 MED ORDER — PHENYLEPHRINE 80 MCG/ML (10ML) SYRINGE FOR IV PUSH (FOR BLOOD PRESSURE SUPPORT)
PREFILLED_SYRINGE | INTRAVENOUS | Status: DC | PRN
  Administered 2023-04-10 (×3): 160 ug via INTRAVENOUS

## 2023-04-10 MED ORDER — PHENYLEPHRINE 80 MCG/ML (10ML) SYRINGE FOR IV PUSH (FOR BLOOD PRESSURE SUPPORT)
PREFILLED_SYRINGE | INTRAVENOUS | Status: AC
  Filled 2023-04-10: qty 10

## 2023-04-10 MED ORDER — PROPOFOL 500 MG/50ML IV EMUL
INTRAVENOUS | Status: DC | PRN
  Administered 2023-04-10: 100 ug/kg/min via INTRAVENOUS

## 2023-04-10 MED ORDER — PROPOFOL 10 MG/ML IV BOLUS
INTRAVENOUS | Status: DC | PRN
  Administered 2023-04-10: 40 mg via INTRAVENOUS
  Administered 2023-04-10: 100 mg via INTRAVENOUS

## 2023-04-10 NOTE — Transfer of Care (Signed)
Immediate Anesthesia Transfer of Care Note  Patient: Norma [name]  Procedure(s) Performed: ESOPHAGOGASTRODUODENOSCOPY (EGD) WITH PROPOFOL SAVORY DILATION POLYPECTOMY INTESTINAL ESOPHAGEAL BRUSHING  Patient Location: Short Stay  Anesthesia Type:General  Level of Consciousness: awake  Airway & Oxygen Therapy: Patient Spontanous Breathing  Post-op Assessment: Report given to RN and Post -op Vital signs reviewed and stable  Post vital signs: Reviewed and stable  Last Vitals:  Vitals Value Taken Time  BP    Temp    Pulse    Resp    SpO2      Last Pain:  Vitals:   04/10/23 1027  PainSc: 4          Complications: No notable events documented.

## 2023-04-10 NOTE — H&P (Signed)
Norma [name] is an 72 y.o. female.   Chief Complaint: Dysphagia and odynophagia HPI: Norma [name] is a 72 y.o. female with past medical history of bipolar disorder, iron deficiency anemia, CKD, COPD, coronary artery disease status post CABG, depression, diabetes, diabetic neuropathy, hyperlipidemia, fatty liver, gastroparesis, GERD, hypothyroidism, IBS, morbid obesity, stroke, restless leg syndrome, who comes for evaluation of dysphagia and odynophagia.   States that every time she swallows she feels a stabbing pain in her chest with some episodes dysphagia.  No nausea or vomiting, fever or chills, melena or hematochezia.  Past Medical History:  Diagnosis Date   Anemia    Anxiety    Bipolar disorder (HCC)    Bulging lumbar disc    L3-4   Chronic daily headache    Chronic low back pain 09/20/2014   CKD (chronic kidney disease)    stage 3   COPD (chronic obstructive pulmonary disease) (HCC)    Coronary artery disease    CABG 2019   Degenerative arthritis    Depression    Diabetes mellitus, type II (HCC)    Diabetic neuropathy (HCC) 09/16/2018   DM type 2 with diabetic peripheral neuropathy (HCC) 09/20/2014   Dyslipidemia    Dyspnea    Family history of adverse reaction to anesthesia    father had reaction to anesthesia medication per patient "they don't use it anymore"- unsure of reaction   Fatty liver    per pt report   Gastroparesis    GERD (gastroesophageal reflux disease)    Heart murmur    History of hiatal hernia    pt states she no longer has this   Hypertension    Hypothyroidism    IBS (irritable bowel syndrome)    LBBB (left bundle branch block)    Memory difficulty 09/20/2014   Morbid obesity (HCC)    Myocardial infarction (HCC)    per pt- prior to CABG   Neuropathy    Obstructive sleep apnea on CPAP    Renal insufficiency    Restless legs syndrome (RLS) 09/17/2012   Stroke (cerebrum) (HCC) 05/16/2017   Left parietal   Urticaria     Past Surgical  History:  Procedure Laterality Date   ABDOMINAL HYSTERECTOMY     ARTHROSCOPY KNEE W/ DRILLING  06/2011   and Decemer of 2013.   Carpel tunnel  1980's   CATARACT EXTRACTION W/PHACO Right 03/21/2017   Procedure: CATARACT EXTRACTION PHACO AND INTRAOCULAR LENS PLACEMENT RIGHT EYE;  Surgeon: Fabio Pierce, MD;  Location: [facility];  Service: Ophthalmology;  Laterality: Right;  CDE: 2.91    CATARACT EXTRACTION W/PHACO Left 04/18/2017   Procedure: CATARACT EXTRACTION PHACO AND INTRAOCULAR LENS PLACEMENT LEFT EYE;  Surgeon: Fabio Pierce, MD;  Location: [facility];  Service: Ophthalmology;  Laterality: Left;  left   CHOLECYSTECTOMY     COLONOSCOPY WITH PROPOFOL N/A 10/04/2016   Procedure: COLONOSCOPY WITH PROPOFOL;  Surgeon: Malissa Hippo, MD;  Location: [facility];  Service: Endoscopy;  Laterality: N/A;  11:10   CORONARY ARTERY BYPASS GRAFT N/A 08/29/2017   Procedure: CORONARY ARTERY BYPASS GRAFTING (CABG) x 3 WITH ENDOSCOPIC HARVESTING OF RIGHT SAPHENOUS VEIN;  Surgeon: Kerin Perna, MD;  Location: [facility] OR;  Service: Open Heart Surgery;  Laterality: N/A;   ESOPHAGOGASTRODUODENOSCOPY N/A 04/25/2016   Procedure: ESOPHAGOGASTRODUODENOSCOPY (EGD);  Surgeon: Malissa Hippo, MD;  [facility]: [facility];  Service: Endoscopy;  Laterality: N/A;  730   LEFT HEART CATH AND CORONARY ANGIOGRAPHY N/A 08/27/2017  Procedure: LEFT HEART CATH AND CORONARY ANGIOGRAPHY;  Surgeon: Tonny Bollman, MD;  Location: [facility] INVAS[facility] CV LAB;  Service: Cardiovascular::  pLAD 95% - p-mLAD 50%, ostD1 90% -pD1 80%; ostOM1 90%; rPDA 80%. EF ~50-55% - HK of dital Anterolateral & Apical wall.  - Rec CVTS c/s   NECK SURGERY     pt reports having growth removed from the back of her neck- after 2021   OPEN REDUCTION INTERNAL FIXATION (ORIF) HAND Left 04/10/2020   Procedure: OPEN REDUCTION INTERNAL FIXATION (ORIF) HAND, left index finger;  Surgeon: Vickki Hearing, MD;  Location: [facility];  Service: Orthopedics;  Laterality: Left;   0.045 k wires   RECTAL SURGERY     fissure   SHOULDER SURGERY Left    arthroscopy in March of this year   TEE WITHOUT CARDIOVERSION N/A 08/29/2017   Procedure: TRANSESOPHAGEAL ECHOCARDIOGRAM (TEE);  Surgeon: Donata Clay, Theron Arista, MD;  Location: [facility] OR;  Service: Open Heart Surgery;  Laterality: N/A;   TRANSFORAMINAL LUMBAR INTERBODY FUSION W/ MIS 1 LEVEL Right 07/12/2021   Procedure: Minimally Invasive Surgery Transforaminal Lumbar Interbody Fusion  Lumbar four-five;  Surgeon: Bedelia Person, MD;  Location: [facility] OR;  Service: Neurosurgery;  Laterality: Right;   TRANSTHORACIC ECHOCARDIOGRAM  06/06/2017   Mild to moderate reduced EF 40 and 45%.  Anterior septal, inferoseptal and basal to mid inferior hypokinesis.  GR 1 DD.  No significant valvular lesion    Family History  Problem Relation Age of Onset   Hypertension Mother    Lymphoma Mother    Depression Mother    Arthritis Father    Alcohol abuse Father    Hypertension Sister    Cancer Brother        kidney and lung   Alcohol abuse Brother    Alcohol abuse Paternal Uncle    Alcohol abuse Paternal Grandfather    Alcohol abuse Paternal Grandmother    Allergic rhinitis Neg Hx    Angioedema Neg Hx    Asthma Neg Hx    Atopy Neg Hx    Eczema Neg Hx    Immunodeficiency Neg Hx    Urticaria Neg Hx    Social History:  reports that she quit smoking about 52 years ago. Her smoking use included cigarettes. She has never used smokeless tobacco. She reports that she does not drink alcohol and does not use drugs.  Allergies:  Allergies  Allergen Reactions   Amoxicillin Anaphylaxis and Other (See Comments)    Has patient had a PCN reaction causing immediate rash, facial/tongue/throat swelling, SOB or lightheadedness with hypotension: Yes Has patient had a PCN reaction causing severe rash involving mucus membranes or skin necrosis: Yes Has patient had a PCN reaction that required hospitalization: Yes Has patient had a PCN reaction occurring  within the last 10 years: Yes If all of the above answers are "NO", then may proceed with Cephalosporin use.    Hydrocodone Anaphylaxis   Percocet [Oxycodone-Acetaminophen] Other (See Comments)    Causes chest pain /tightness. Pressure pain around her ribs.   Depacon [Valproate Sodium]    Depacon [Valproic Acid] Other (See Comments)    Causes falls    Oxycodone    Oxycodone Hcl    Prednisone Itching    Medications Prior to Admission  Medication Sig Dispense Refill   acetaminophen (TYLENOL) 325 MG tablet Take 650 mg by mouth. Takes 3 tabs daily     ARIPiprazole (ABILIFY) 5 MG tablet Take 1 tablet (5 mg total) by mouth daily.  30 tablet 2   aspirin EC 81 MG tablet Take 1 tablet (81 mg total) by mouth daily. 90 tablet 3   azelastine (ASTELIN) 0.1 % nasal spray Place into both nostrils 2 (two) times daily. Use in each nostril as directed     buPROPion (WELLBUTRIN XL) 150 MG 24 hr tablet Take 1 tablet (150 mg total) by mouth every morning. 30 tablet 2   carvedilol (COREG) 6.25 MG tablet Take 1 tablet (6.25 mg total) by mouth 2 (two) times daily with a meal. 180 tablet 2   Cholecalciferol (VITAMIN D3) 50 MCG (2000 UT) capsule Take 1 capsule (2,000 Units total) by mouth daily with lunch. 90 capsule 3   diclofenac Sodium (VOLTAREN) 1 % GEL Apply 2 g topically 4 (four) times daily as needed (pain).     gabapentin (NEURONTIN) 400 MG capsule Take 400 mg by mouth in the morning. & 800 mg at bedtime     glipiZIDE (GLUCOTROL XL) 5 MG 24 hr tablet Take 1 tablet (5 mg total) by mouth daily with breakfast. 90 tablet 1   insulin glargine, 1 Unit Dial, (TOUJEO SOLOSTAR) 300 UNIT/ML Solostar Pen Inject 50 Units into the skin at bedtime. 12 mL 1   lamoTRIgine (LAMICTAL) 150 MG tablet Take 1 tablet (150 mg total) by mouth at bedtime. 30 tablet 2   levothyroxine (SYNTHROID) 112 MCG tablet Take 112 mcg by mouth daily before breakfast.     losartan (COZAAR) 25 MG tablet Take 25 mg by mouth daily.     OVER THE  COUNTER MEDICATION daily as needed (pain). CBD ointment/roll on     pantoprazole (PROTONIX) 40 MG tablet Take 1 tablet by mouth daily before supper. 90 tablet 3   rOPINIRole (REQUIP) 3 MG tablet TAKE ONE TABLET BY MOUTH EVERY MORNING, ,NOON AND AT BEDTIME. (PRT PT MORNING,EVENING,BEDTIME) (Patient taking differently: Take 3 mg by mouth 3 (three) times daily.) 270 tablet 3   tiZANidine (ZANAFLEX) 2 MG tablet Take 2 mg by mouth 2 (two) times daily.     venlafaxine XR (EFFEXOR-XR) 150 MG 24 hr capsule TAKE TWO (2) CAPSULES BY MOUTH AT BEDTIME 60 capsule 2   ALPRAZolam (XANAX) 0.25 MG tablet Take 1 tablet (0.25 mg total) by mouth at bedtime as needed for anxiety or sleep. 30 tablet 2   diphenhydrAMINE (BENADRYL) 25 mg capsule Take 25 mg by mouth as needed for allergies or itching.     empagliflozin (JARDIANCE) 10 MG TABS tablet Take 1 tablet (10 mg total) by mouth daily with breakfast. 90 tablet 3   EPINEPHrine 0.3 mg/0.3 mL IJ SOAJ injection Inject 0.3 mg into the muscle once.     Evolocumab (REPATHA SURECLICK) 140 MG/ML SOAJ INJECT 1 DOSE UNDER THE SKIN EVERY 14 DAYS 6 mL 0   ferrous sulfate 325 (65 FE) MG EC tablet Take 325 mg by mouth. One daily     meclizine (ANTIVERT) 25 MG tablet Take 1 tablet (25 mg total) by mouth 3 (three) times daily as needed for dizziness. (Patient taking differently: Take 25 mg by mouth daily as needed for dizziness.) 30 tablet 0   torsemide (DEMADEX) 20 MG tablet Take 40 mg by mouth daily. (Patient not taking: Reported on 03/03/2023)      Results for orders placed or performed during the hospital encounter of 04/10/23 (from the past 48 hours)  CBC with Differential/Platelet     Status: None   Collection Time: 04/10/23  8:46 AM  Result Value Ref Range  WBC 9.7 4.0 - 10.5 K/uL   RBC 4.15 3.87 - 5.11 MIL/uL   Hemoglobin 13.2 12.0 - 15.0 g/dL   HCT 25.3 66.4 - 40.3 %   MCV 97.1 80.0 - 100.0 fL   MCH 31.8 26.0 - 34.0 pg   MCHC 32.8 30.0 - 36.0 g/dL   RDW 47.4 25.9 -  56.3 %   Platelets 257 150 - 400 K/uL   nRBC 0.0 0.0 - 0.2 %   Neutrophils Relative % 70 %   Neutro Abs 7.0 1.7 - 7.7 K/uL   Lymphocytes Relative 15 %   Lymphs Abs 1.4 0.7 - 4.0 K/uL   Monocytes Relative 9 %   Monocytes Absolute 0.8 0.1 - 1.0 K/uL   Eosinophils Relative 4 %   Eosinophils Absolute 0.4 0.0 - 0.5 K/uL   Basophils Relative 1 %   Basophils Absolute 0.1 0.0 - 0.1 K/uL   Immature Granulocytes 1 %   Abs Immature Granulocytes 0.05 0.00 - 0.07 K/uL    Comment: Performed at [facility], [address] [place], [place] [address]  Basic metabolic panel     Status: Abnormal   Collection Time: 04/10/23  8:46 AM  Result Value Ref Range   Sodium 137 135 - 145 mmol/L   Potassium 4.1 3.5 - 5.1 mmol/L   Chloride 103 98 - 111 mmol/L   CO2 25 22 - 32 mmol/L   Glucose, Bld 186 (H) 70 - 99 mg/dL    Comment: Glucose reference range applies only to samples taken after fasting for at least 8 hours.   BUN 25 (H) 8 - 23 mg/dL   Creatinine, Ser 3.32 (H) 0.44 - 1.00 mg/dL   Calcium 8.5 (L) 8.9 - 10.3 mg/dL   GFR, Estimated 45 (L) >60 mL/min    Comment: (NOTE) Calculated using the CKD-EPI Creatinine Equation (2021)    Anion gap 9 5 - 15    Comment: Performed at [facility], [address], [place], [place] [address]  Glucose, capillary     Status: Abnormal   Collection Time: 04/10/23  8:49 AM  Result Value Ref Range   Glucose-Capillary 172 (H) 70 - 99 mg/dL    Comment: Glucose reference range applies only to samples taken after fasting for at least 8 hours.   *Note: Due to a large number of results and/or encounters for the requested time period, some results have not been displayed. A complete set of results can be found in Results Review.   No results found.  Review of Systems  HENT:  Positive for trouble swallowing.   All other systems reviewed and are negative.   Blood pressure (!) 109/43, pulse 75, temperature 98.3 F (36.8 C), resp. rate (!) 22, height 5' (1.524 m), weight  119.3 kg, SpO2 96%. Physical Exam  GENERAL: The patient is AO x3, in no acute distress. HEENT: Head is normocephalic and atraumatic. EOMI are intact. Mouth is well hydrated and without lesions. NECK: Supple. No masses LUNGS: Clear to auscultation. No presence of rhonchi/wheezing/rales. Adequate chest expansion HEART: RRR, normal s1 and s2. ABDOMEN: Soft, nontender, no guarding, no peritoneal signs, and nondistended. BS +. No masses. EXTREMITIES: Without any cyanosis, clubbing, rash, lesions or edema. NEUROLOGIC: AOx3, no focal motor deficit. SKIN: no jaundice, no rashes  Assessment/Plan Norma [name] is a 72 y.o. female with past medical history of bipolar disorder, iron deficiency anemia, CKD, COPD, coronary artery disease status post CABG, depression, diabetes, diabetic neuropathy, hyperlipidemia, fatty liver, gastroparesis, GERD, hypothyroidism,  IBS, morbid obesity, stroke, restless leg syndrome, who comes for evaluation of dysphagia and odynophagia.  Will proceed with EGD.  Dolores Frame, MD 04/10/2023, 10:23 AM

## 2023-04-10 NOTE — Anesthesia Preprocedure Evaluation (Addendum)
Anesthesia Evaluation  Patient identified by MRN, date of birth, ID band Patient awake    Reviewed: Allergy & Precautions, NPO status , Patient's Chart, lab work & pertinent test results  History of Anesthesia Complications Negative for: history of anesthetic complications  Airway Mallampati: II  TM Distance: >3 FB Neck ROM: Full    Dental  (+) Missing, Dental Advisory Given, Edentulous Upper   Pulmonary shortness of breath, sleep apnea and Continuous Positive Airway Pressure Ventilation , COPD, former smoker   Pulmonary exam normal breath sounds clear to auscultation       Cardiovascular hypertension, Pt. on medications and Pt. on home beta blockers + angina  + CAD, + Past MI, + CABG (2019) and + DOE  Normal cardiovascular exam+ dysrhythmias + Valvular Problems/Murmurs  Rhythm:Regular Rate:Normal   Echo 10/18/20: EF 50-55%, no RWMA, normal RVSF, AV sclerosis w/o stenosis   Neuro/Psych  Headaches PSYCHIATRIC DISORDERS Anxiety Depression Bipolar Disorder   Diabetic neuropathy  Neuromuscular disease CVA (2019)    GI/Hepatic Neg liver ROS, hiatal hernia,GERD  ,,  Endo/Other  diabetes, Type 2, Oral Hypoglycemic AgentsHypothyroidism  Class 4 obesity  Renal/GU CRF and Renal InsufficiencyRenal disease (Cr 1.70)Stage 3 CKD  negative genitourinary   Musculoskeletal  (+) Arthritis ,    Abdominal  (+) + obese  Peds  Hematology negative hematology ROS (+)   Anesthesia Other Findings   Reproductive/Obstetrics                             Anesthesia Physical Anesthesia Plan  ASA: 4  Anesthesia Plan: General   Post-op Pain Management: Minimal or no pain anticipated   Induction: Intravenous  PONV Risk Score and Plan: 3 and Propofol infusion  Airway Management Planned: Nasal Cannula and Natural Airway  Additional Equipment: None  Intra-op Plan:   Post-operative Plan:   Informed Consent: I have  reviewed the patients History and Physical, chart, labs and discussed the procedure including the risks, benefits and alternatives for the proposed anesthesia with the patient or authorized representative who has indicated his/her understanding and acceptance.     Dental advisory given  Plan Discussed with:   Anesthesia Plan Comments: (See APP note by Joslyn Hy, FNP )        Anesthesia Quick Evaluation

## 2023-04-10 NOTE — Discharge Instructions (Signed)
You are being discharged to home.  ?Resume your previous diet.  ?We are waiting for your pathology results.  ?Continue your present medications.  ?

## 2023-04-10 NOTE — Op Note (Signed)
[facility] Patient Name: Norma [name] Procedure Date: 04/10/2023 10:18 AM MRN: [id number] Date of Birth: 1951/02/21 Attending MD: Katrinka Blazing , , [id number] CSN: [id number] Age: 72 Admit Type: Outpatient Procedure:                Upper GI endoscopy Indications:              Dysphagia, Odynophagia Providers:                Katrinka Blazing, Buel Ream. Thomasena Edis RN, RN, Lennice Sites Technician, Pensions consultant Referring MD:              Medicines:                Monitored Anesthesia Care Complications:            No immediate complications. Estimated Blood Loss:     Estimated blood loss: none. Procedure:                Pre-Anesthesia Assessment:                           - Prior to the procedure, a History and Physical                            was performed, and patient medications, allergies                            and sensitivities were reviewed. The patient's                            tolerance of previous anesthesia was reviewed.                           - The risks and benefits of the procedure and the                            sedation options and risks were discussed with the                            patient. All questions were answered and informed                            consent was obtained.                           - ASA Grade Assessment: III - A patient with severe                            systemic disease.                           After obtaining informed consent, the endoscope was                            passed under direct vision. Throughout the  procedure, the patient's blood pressure, pulse, and                            oxygen saturations were monitored continuously. The                            [id number]-[id number] ([id number]) scope was introduced through the                            mouth, and advanced to the second part of duodenum.                            The upper GI endoscopy was  accomplished without                            difficulty. The patient tolerated the procedure                            well. Scope In: 10:32:58 AM Scope Out: 10:42:57 AM Total Procedure Duration: 0 hours 9 minutes 59 seconds  Findings:      No endoscopic abnormality was evident in the esophagus to explain the       patient's complaint of dysphagia. It was decided, however, to proceed       with dilation of the entire esophagus. A guidewire was placed and the       scope was withdrawn. Dilation was performed with a Savary dilator with       no resistance at 18 mm. The dilation site was examined following       endoscope reinsertion and showed mild mucosal disruption. Brushings for       KOH prep were obtained.      A 2 cm hiatal hernia was present.      A single 5 mm sessile polyp with no bleeding was found in the gastric       body. The polyp was removed with a cold snare. Resection and retrieval       were complete.      The examined duodenum was normal. Impression:               - No endoscopic esophageal abnormality to explain                            patient's dysphagia. Esophagus dilated. Dilated.                            Brushings performed.                           - 2 cm hiatal hernia.                           - A single gastric polyp. Resected and retrieved.                           - Normal examined duodenum. Moderate Sedation:      Per Anesthesia Care Recommendation:           -  Discharge patient to home (ambulatory).                           - Resume previous diet.                           - Await pathology results.                           - Continue present medications. Procedure Code(s):        --- Professional ---                           [id number], Esophagogastroduodenoscopy, flexible,                            transoral; with removal of tumor(s), polyp(s), or                            other lesion(s) by snare technique                           [id number],  Esophagogastroduodenoscopy, flexible,                            transoral; with insertion of guide wire followed by                            passage of dilator(s) through esophagus over guide                            wire Diagnosis Code(s):        --- Professional ---                           R13.10, Dysphagia, unspecified                           K44.9, Diaphragmatic hernia without obstruction or                            gangrene                           K31.7, Polyp of stomach and duodenum CPT copyright 2022 [employer]. All rights reserved. The codes documented in this report are preliminary and upon coder review may  be revised to meet current compliance requirements. Katrinka Blazing, MD Katrinka Blazing,  04/10/2023 10:48:09 AM This report has been signed electronically. Number of Addenda: 0

## 2023-04-10 NOTE — Anesthesia Procedure Notes (Signed)
Date/Time: 04/10/2023 10:27 AM  Performed by: Julian Reil, CRNAPre-anesthesia Checklist: Patient identified, Emergency Drugs available, Suction available and Patient being monitored Patient Re-evaluated:Patient Re-evaluated prior to induction Oxygen Delivery Method: Nasal cannula Induction Type: IV induction Comments: Optiflow High Flow Speed O2 used.

## 2023-04-10 NOTE — Anesthesia Postprocedure Evaluation (Signed)
Anesthesia Post Note  Patient: JENESE LOPEZMARTINEZ  Procedure(s) Performed: ESOPHAGOGASTRODUODENOSCOPY (EGD) WITH PROPOFOL SAVORY DILATION POLYPECTOMY INTESTINAL ESOPHAGEAL BRUSHING  Patient location during evaluation: PACU Anesthesia Type: General Level of consciousness: awake and alert Pain management: pain level controlled Vital Signs Assessment: post-procedure vital signs reviewed and stable Respiratory status: spontaneous breathing, nonlabored ventilation, respiratory function stable and patient connected to nasal cannula oxygen Cardiovascular status: blood pressure returned to baseline and stable Postop Assessment: no apparent nausea or vomiting Anesthetic complications: no   There were no known notable events for this encounter.   Last Vitals:  Vitals:   04/10/23 0857 04/10/23 1049  BP: (!) 109/43 (!) 119/42  Pulse: 75 71  Resp: (!) 22 (!) 23  Temp: 36.8 C 36.6 C  SpO2: 96% 95%    Last Pain:  Vitals:   04/10/23 1049  TempSrc: Axillary  PainSc: 0-No pain                 Joslyn Ramos L Ashauna Bertholf

## 2023-04-11 DIAGNOSIS — L97521 Non-pressure chronic ulcer of other part of left foot limited to breakdown of skin: Secondary | ICD-10-CM | POA: Diagnosis not present

## 2023-04-11 DIAGNOSIS — E11621 Type 2 diabetes mellitus with foot ulcer: Secondary | ICD-10-CM | POA: Diagnosis not present

## 2023-04-11 DIAGNOSIS — I509 Heart failure, unspecified: Secondary | ICD-10-CM | POA: Diagnosis not present

## 2023-04-11 DIAGNOSIS — I110 Hypertensive heart disease with heart failure: Secondary | ICD-10-CM | POA: Diagnosis not present

## 2023-04-11 DIAGNOSIS — J449 Chronic obstructive pulmonary disease, unspecified: Secondary | ICD-10-CM | POA: Diagnosis not present

## 2023-04-11 DIAGNOSIS — Z794 Long term (current) use of insulin: Secondary | ICD-10-CM | POA: Diagnosis not present

## 2023-04-11 LAB — SURGICAL PATHOLOGY

## 2023-04-13 ENCOUNTER — Emergency Department (HOSPITAL_COMMUNITY): Payer: Medicare Other

## 2023-04-13 ENCOUNTER — Other Ambulatory Visit: Payer: Self-pay

## 2023-04-13 ENCOUNTER — Encounter (HOSPITAL_COMMUNITY): Payer: Self-pay | Admitting: Student

## 2023-04-13 ENCOUNTER — Observation Stay (HOSPITAL_COMMUNITY)
Admission: EM | Admit: 2023-04-13 | Discharge: 2023-04-14 | Disposition: A | Discharge status: Home Health | Payer: Medicare Other | Attending: Internal Medicine | Admitting: Internal Medicine

## 2023-04-13 DIAGNOSIS — L03116 Cellulitis of left lower limb: Secondary | ICD-10-CM | POA: Insufficient documentation

## 2023-04-13 DIAGNOSIS — R0689 Other abnormalities of breathing: Secondary | ICD-10-CM | POA: Diagnosis not present

## 2023-04-13 DIAGNOSIS — M7989 Other specified soft tissue disorders: Secondary | ICD-10-CM | POA: Diagnosis not present

## 2023-04-13 DIAGNOSIS — N1832 Chronic kidney disease, stage 3b: Secondary | ICD-10-CM | POA: Diagnosis not present

## 2023-04-13 DIAGNOSIS — Z951 Presence of aortocoronary bypass graft: Secondary | ICD-10-CM | POA: Diagnosis not present

## 2023-04-13 DIAGNOSIS — Z7982 Long term (current) use of aspirin: Secondary | ICD-10-CM | POA: Diagnosis not present

## 2023-04-13 DIAGNOSIS — E1121 Type 2 diabetes mellitus with diabetic nephropathy: Secondary | ICD-10-CM | POA: Diagnosis not present

## 2023-04-13 DIAGNOSIS — F32A Depression, unspecified: Secondary | ICD-10-CM | POA: Diagnosis not present

## 2023-04-13 DIAGNOSIS — G4733 Obstructive sleep apnea (adult) (pediatric): Secondary | ICD-10-CM | POA: Insufficient documentation

## 2023-04-13 DIAGNOSIS — Z20822 Contact with and (suspected) exposure to covid-19: Secondary | ICD-10-CM | POA: Insufficient documentation

## 2023-04-13 DIAGNOSIS — Z8673 Personal history of transient ischemic attack (TIA), and cerebral infarction without residual deficits: Secondary | ICD-10-CM | POA: Insufficient documentation

## 2023-04-13 DIAGNOSIS — D631 Anemia in chronic kidney disease: Secondary | ICD-10-CM | POA: Diagnosis not present

## 2023-04-13 DIAGNOSIS — M7732 Calcaneal spur, left foot: Secondary | ICD-10-CM | POA: Diagnosis not present

## 2023-04-13 DIAGNOSIS — K219 Gastro-esophageal reflux disease without esophagitis: Secondary | ICD-10-CM | POA: Diagnosis not present

## 2023-04-13 DIAGNOSIS — Z794 Long term (current) use of insulin: Secondary | ICD-10-CM | POA: Insufficient documentation

## 2023-04-13 DIAGNOSIS — E1122 Type 2 diabetes mellitus with diabetic chronic kidney disease: Secondary | ICD-10-CM | POA: Diagnosis not present

## 2023-04-13 DIAGNOSIS — J449 Chronic obstructive pulmonary disease, unspecified: Secondary | ICD-10-CM | POA: Insufficient documentation

## 2023-04-13 DIAGNOSIS — J40 Bronchitis, not specified as acute or chronic: Secondary | ICD-10-CM

## 2023-04-13 DIAGNOSIS — G2581 Restless legs syndrome: Secondary | ICD-10-CM | POA: Insufficient documentation

## 2023-04-13 DIAGNOSIS — E039 Hypothyroidism, unspecified: Secondary | ICD-10-CM | POA: Insufficient documentation

## 2023-04-13 DIAGNOSIS — R6510 Systemic inflammatory response syndrome (SIRS) of non-infectious origin without acute organ dysfunction: Secondary | ICD-10-CM

## 2023-04-13 DIAGNOSIS — R509 Fever, unspecified: Secondary | ICD-10-CM | POA: Diagnosis not present

## 2023-04-13 DIAGNOSIS — F419 Anxiety disorder, unspecified: Secondary | ICD-10-CM | POA: Insufficient documentation

## 2023-04-13 DIAGNOSIS — I129 Hypertensive chronic kidney disease with stage 1 through stage 4 chronic kidney disease, or unspecified chronic kidney disease: Secondary | ICD-10-CM | POA: Diagnosis not present

## 2023-04-13 DIAGNOSIS — A419 Sepsis, unspecified organism: Secondary | ICD-10-CM | POA: Diagnosis not present

## 2023-04-13 DIAGNOSIS — Z79899 Other long term (current) drug therapy: Secondary | ICD-10-CM | POA: Insufficient documentation

## 2023-04-13 DIAGNOSIS — I2510 Atherosclerotic heart disease of native coronary artery without angina pectoris: Secondary | ICD-10-CM | POA: Diagnosis not present

## 2023-04-13 DIAGNOSIS — E66813 Obesity, class 3: Secondary | ICD-10-CM | POA: Diagnosis not present

## 2023-04-13 DIAGNOSIS — E1140 Type 2 diabetes mellitus with diabetic neuropathy, unspecified: Secondary | ICD-10-CM | POA: Diagnosis not present

## 2023-04-13 DIAGNOSIS — R0902 Hypoxemia: Secondary | ICD-10-CM | POA: Diagnosis not present

## 2023-04-13 DIAGNOSIS — R531 Weakness: Secondary | ICD-10-CM

## 2023-04-13 DIAGNOSIS — R000 Tachycardia, unspecified: Secondary | ICD-10-CM | POA: Diagnosis not present

## 2023-04-13 DIAGNOSIS — J209 Acute bronchitis, unspecified: Secondary | ICD-10-CM | POA: Diagnosis not present

## 2023-04-13 DIAGNOSIS — E785 Hyperlipidemia, unspecified: Secondary | ICD-10-CM | POA: Insufficient documentation

## 2023-04-13 DIAGNOSIS — Z6841 Body Mass Index (BMI) 40.0 and over, adult: Secondary | ICD-10-CM | POA: Diagnosis not present

## 2023-04-13 DIAGNOSIS — Z87891 Personal history of nicotine dependence: Secondary | ICD-10-CM | POA: Insufficient documentation

## 2023-04-13 DIAGNOSIS — M19072 Primary osteoarthritis, left ankle and foot: Secondary | ICD-10-CM | POA: Diagnosis not present

## 2023-04-13 LAB — COMPREHENSIVE METABOLIC PANEL
ALT: 12 U/L (ref 0–44)
AST: 18 U/L (ref 15–41)
Albumin: 3.6 g/dL (ref 3.5–5.0)
Alkaline Phosphatase: 65 U/L (ref 38–126)
Anion gap: 12 (ref 5–15)
BUN: 22 mg/dL (ref 8–23)
CO2: 23 mmol/L (ref 22–32)
Calcium: 9.3 mg/dL (ref 8.9–10.3)
Chloride: 98 mmol/L (ref 98–111)
Creatinine, Ser: 1.36 mg/dL — ABNORMAL HIGH (ref 0.44–1.00)
GFR, Estimated: 42 mL/min — ABNORMAL LOW (ref >60)
Glucose, Bld: 190 mg/dL — ABNORMAL HIGH (ref 70–99)
Potassium: 4.3 mmol/L (ref 3.5–5.1)
Sodium: 133 mmol/L — ABNORMAL LOW (ref 135–145)
Total Bilirubin: 0.7 mg/dL (ref <1.2)
Total Protein: 8.2 g/dL — ABNORMAL HIGH (ref 6.5–8.1)

## 2023-04-13 LAB — URINALYSIS, W/ REFLEX TO CULTURE (INFECTION SUSPECTED)
Bacteria, UA: NONE SEEN
Bilirubin Urine: NEGATIVE
Glucose, UA: >=500 mg/dL — AB
Ketones, ur: 5 mg/dL — AB
Leukocytes,Ua: NEGATIVE
Nitrite: NEGATIVE
Protein, ur: 100 mg/dL — AB
Specific Gravity, Urine: 1.022 (ref 1.005–1.030)
pH: 6 (ref 5.0–8.0)

## 2023-04-13 LAB — PROTIME-INR
INR: 1 (ref 0.8–1.2)
Prothrombin Time: 13.5 s (ref 11.4–15.2)

## 2023-04-13 LAB — CBC
HCT: 41.9 % (ref 36.0–46.0)
Hemoglobin: 14.2 g/dL (ref 12.0–15.0)
MCH: 31.5 pg (ref 26.0–34.0)
MCHC: 33.9 g/dL (ref 30.0–36.0)
MCV: 92.9 fL (ref 80.0–100.0)
Platelets: 251 10*3/uL (ref 150–400)
RBC: 4.51 MIL/uL (ref 3.87–5.11)
RDW: 12.9 % (ref 11.5–15.5)
WBC: 15.8 10*3/uL — ABNORMAL HIGH (ref 4.0–10.5)
nRBC: 0 % (ref 0.0–0.2)

## 2023-04-13 LAB — PROCALCITONIN: Procalcitonin: 0.17 ng/mL

## 2023-04-13 LAB — CBG MONITORING, ED
Glucose-Capillary: 198 mg/dL — ABNORMAL HIGH (ref 70–99)
Glucose-Capillary: 179 mg/dL — ABNORMAL HIGH (ref 70–99)

## 2023-04-13 LAB — RESP PANEL BY RT-PCR (RSV, FLU A&B, COVID)  RVPGX2
Influenza A by PCR: NEGATIVE
Influenza B by PCR: NEGATIVE
Resp Syncytial Virus by PCR: NEGATIVE
SARS Coronavirus 2 by RT PCR: NEGATIVE

## 2023-04-13 LAB — SEDIMENTATION RATE: Sed Rate: 35 mm/h — ABNORMAL HIGH (ref 0–22)

## 2023-04-13 LAB — LACTIC ACID, PLASMA
Lactic Acid, Venous: 1.3 mmol/L (ref 0.5–1.9)
Lactic Acid, Venous: 1.2 mmol/L (ref 0.5–1.9)

## 2023-04-13 MED ORDER — INSULIN GLARGINE-YFGN 100 UNIT/ML ~~LOC~~ SOLN
25.0000 [IU] | Freq: Every day | SUBCUTANEOUS | Status: DC
  Administered 2023-04-14: 25 [IU] via SUBCUTANEOUS
  Filled 2023-04-13 (×2): qty 0.25

## 2023-04-13 MED ORDER — ACETAMINOPHEN 325 MG PO TABS
650.0000 mg | ORAL_TABLET | Freq: Four times a day (QID) | ORAL | Status: DC | PRN
  Administered 2023-04-14: 650 mg via ORAL
  Filled 2023-04-13: qty 2

## 2023-04-13 MED ORDER — ACETAMINOPHEN 650 MG RE SUPP: 650.0000 mg | Freq: Four times a day (QID) | RECTAL | Status: DC | PRN

## 2023-04-13 MED ORDER — PANTOPRAZOLE SODIUM 40 MG PO TBEC: 40.0000 mg | DELAYED_RELEASE_TABLET | Freq: Every day | ORAL | Status: DC

## 2023-04-13 MED ORDER — INSULIN ASPART 100 UNIT/ML IJ SOLN
0.0000 [IU] | Freq: Three times a day (TID) | INTRAMUSCULAR | Status: DC
  Administered 2023-04-14: 2 [IU] via SUBCUTANEOUS
  Administered 2023-04-14: 3 [IU] via SUBCUTANEOUS

## 2023-04-13 MED ORDER — LOSARTAN POTASSIUM 25 MG PO TABS: 25.0000 mg | ORAL_TABLET | Freq: Every day | ORAL | Status: DC

## 2023-04-13 MED ORDER — VANCOMYCIN HCL 750 MG/150ML IV SOLN: 750.0000 mg | INTRAVENOUS | Status: DC

## 2023-04-13 MED ORDER — VANCOMYCIN HCL 1500 MG/300ML IV SOLN
1500.0000 mg | Freq: Once | INTRAVENOUS | Status: DC
  Filled 2023-04-13: qty 300

## 2023-04-13 MED ORDER — SENNOSIDES-DOCUSATE SODIUM 8.6-50 MG PO TABS: 1.0000 | ORAL_TABLET | Freq: Every evening | ORAL | Status: DC | PRN

## 2023-04-13 MED ORDER — VANCOMYCIN HCL IN DEXTROSE 1-5 GM/200ML-% IV SOLN
1000.0000 mg | Freq: Once | INTRAVENOUS | Status: AC
  Administered 2023-04-13: 1000 mg via INTRAVENOUS

## 2023-04-13 MED ORDER — CARVEDILOL 3.125 MG PO TABS
6.2500 mg | ORAL_TABLET | Freq: Two times a day (BID) | ORAL | Status: DC
  Administered 2023-04-14: 6.25 mg via ORAL
  Filled 2023-04-13: qty 2

## 2023-04-13 MED ORDER — ONDANSETRON HCL 4 MG/2ML IJ SOLN: 4.0000 mg | Freq: Four times a day (QID) | INTRAMUSCULAR | Status: DC | PRN

## 2023-04-13 MED ORDER — LEVOTHYROXINE SODIUM 112 MCG PO TABS
112.0000 ug | ORAL_TABLET | Freq: Every day | ORAL | Status: DC
  Administered 2023-04-14: 112 ug via ORAL
  Filled 2023-04-13: qty 1

## 2023-04-13 MED ORDER — VITAMIN D 25 MCG (1000 UNIT) PO TABS
2000.0000 [IU] | ORAL_TABLET | Freq: Every day | ORAL | Status: DC
  Administered 2023-04-14: 2000 [IU] via ORAL
  Filled 2023-04-13: qty 2

## 2023-04-13 MED ORDER — VENLAFAXINE HCL ER 75 MG PO CP24
300.0000 mg | ORAL_CAPSULE | Freq: Every day | ORAL | Status: DC
  Administered 2023-04-14: 300 mg via ORAL
  Filled 2023-04-13: qty 4

## 2023-04-13 MED ORDER — SODIUM CHLORIDE 0.9 % IV BOLUS
1000.0000 mL | Freq: Once | INTRAVENOUS | Status: AC
  Administered 2023-04-13: 1000 mL via INTRAVENOUS

## 2023-04-13 MED ORDER — EMPAGLIFLOZIN 10 MG PO TABS: 10.0000 mg | ORAL_TABLET | Freq: Every day | ORAL | Status: DC

## 2023-04-13 MED ORDER — SODIUM CHLORIDE 0.9 % IV SOLN
500.0000 mg | Freq: Once | INTRAVENOUS | Status: AC
  Administered 2023-04-13: 500 mg via INTRAVENOUS
  Filled 2023-04-13: qty 5

## 2023-04-13 MED ORDER — BUPROPION HCL ER (XL) 150 MG PO TB24
150.0000 mg | ORAL_TABLET | Freq: Every morning | ORAL | Status: DC
  Administered 2023-04-14: 150 mg via ORAL
  Filled 2023-04-13: qty 1

## 2023-04-13 MED ORDER — SODIUM CHLORIDE 0.9 % IV SOLN
2.0000 g | Freq: Once | INTRAVENOUS | Status: AC
  Administered 2023-04-13: 2 g via INTRAVENOUS
  Filled 2023-04-13: qty 20

## 2023-04-13 MED ORDER — ACETAMINOPHEN 500 MG PO TABS
1000.0000 mg | ORAL_TABLET | Freq: Once | ORAL | Status: AC
  Administered 2023-04-13: 1000 mg via ORAL
  Filled 2023-04-13: qty 2

## 2023-04-13 MED ORDER — ROPINIROLE HCL 1 MG PO TABS
3.0000 mg | ORAL_TABLET | Freq: Three times a day (TID) | ORAL | Status: DC
  Administered 2023-04-14 (×2): 3 mg via ORAL
  Filled 2023-04-13 (×2): qty 3

## 2023-04-13 MED ORDER — ASPIRIN 81 MG PO TBEC
81.0000 mg | DELAYED_RELEASE_TABLET | Freq: Every day | ORAL | Status: DC
  Administered 2023-04-14: 81 mg via ORAL
  Filled 2023-04-13: qty 1

## 2023-04-13 MED ORDER — ARIPIPRAZOLE 5 MG PO TABS
5.0000 mg | ORAL_TABLET | Freq: Every day | ORAL | Status: DC
  Administered 2023-04-14: 5 mg via ORAL
  Filled 2023-04-13: qty 1

## 2023-04-13 MED ORDER — VANCOMYCIN HCL 1500 MG/300ML IV SOLN
1500.0000 mg | Freq: Once | INTRAVENOUS | Status: AC
  Administered 2023-04-13: 1500 mg via INTRAVENOUS

## 2023-04-13 MED ORDER — INSULIN ASPART 100 UNIT/ML IJ SOLN: 0.0000 [IU] | Freq: Every day | INTRAMUSCULAR | Status: DC

## 2023-04-13 MED ORDER — CEFTRIAXONE SODIUM 2 G IJ SOLR: 2.0000 g | INTRAMUSCULAR | Status: DC

## 2023-04-13 MED ORDER — ONDANSETRON HCL 4 MG PO TABS: 4.0000 mg | ORAL_TABLET | Freq: Four times a day (QID) | ORAL | Status: DC | PRN

## 2023-04-13 MED ORDER — VANCOMYCIN HCL IN DEXTROSE 1-5 GM/200ML-% IV SOLN
1000.0000 mg | Freq: Once | INTRAVENOUS | Status: DC
  Filled 2023-04-13: qty 200

## 2023-04-13 MED ORDER — GLIPIZIDE ER 5 MG PO TB24
5.0000 mg | ORAL_TABLET | Freq: Every day | ORAL | Status: DC
  Administered 2023-04-14: 5 mg via ORAL
  Filled 2023-04-13: qty 1

## 2023-04-13 MED ORDER — LAMOTRIGINE 25 MG PO TABS
150.0000 mg | ORAL_TABLET | Freq: Every day | ORAL | Status: DC
  Administered 2023-04-14: 150 mg via ORAL
  Filled 2023-04-13: qty 2

## 2023-04-13 MED ORDER — ENOXAPARIN SODIUM 60 MG/0.6ML IJ SOSY: 0.5000 mg/kg | PREFILLED_SYRINGE | INTRAMUSCULAR | Status: DC

## 2023-04-13 MED ORDER — ONDANSETRON HCL 4 MG/2ML IJ SOLN
4.0000 mg | Freq: Once | INTRAMUSCULAR | Status: AC
Start: 2023-04-13 — End: 2023-04-13
  Administered 2023-04-13: 4 mg via INTRAVENOUS
  Filled 2023-04-13: qty 2

## 2023-04-13 MED ORDER — ALPRAZOLAM 0.25 MG PO TABS: 0.2500 mg | ORAL_TABLET | Freq: Every evening | ORAL | Status: DC | PRN

## 2023-04-13 MED ORDER — DIPHENHYDRAMINE HCL 25 MG PO CAPS: 25.0000 mg | ORAL_CAPSULE | Freq: Four times a day (QID) | ORAL | Status: DC | PRN

## 2023-04-13 MED ORDER — GABAPENTIN 400 MG PO CAPS
400.0000 mg | ORAL_CAPSULE | Freq: Every morning | ORAL | Status: DC
Start: 2023-04-14 — End: 2023-04-14
  Administered 2023-04-14: 400 mg via ORAL
  Filled 2023-04-13: qty 1

## 2023-04-13 NOTE — Progress Notes (Signed)
Pt being followed by [facility] for Sepsis protocol. 

## 2023-04-13 NOTE — Progress Notes (Signed)
PHARMACIST - PHYSICIAN COMMUNICATION  CONCERNING:  Enoxaparin (Lovenox) for DVT Prophylaxis    RECOMMENDATION: Patient was prescribed enoxaprin 40mg  q24 hours for VTE prophylaxis.   Filed Weights   04/13/23 1844  Weight: 119.3 kg (263 lb 0.1 oz)    Body mass index is 51.37 kg/m.  Estimated Creatinine Clearance: 45.6 mL/min (A) (by C-G formula based on SCr of 1.36 mg/dL (H)).   Based on [facility] policy patient is candidate for enoxaparin 0.5mg /kg TBW SQ every 24 hours based on BMI being >30.  DESCRIPTION: Pharmacy has adjusted enoxaparin dose per [facility] policy.  Patient is now receiving enoxaparin 60 mg every 24 hours    Rockwell Alexandria, PharmD Clinical Pharmacist  04/13/2023 9:36 PM

## 2023-04-13 NOTE — Progress Notes (Signed)
Pharmacy Antibiotic Note  Norma [name] is a 72 y.o. female admitted on 04/13/2023 with cellulitis. PMH significant for COPD on nightly oxygen, CKD, CAD status post CABG, diabetes, obesity, and chronic left foot wound. In ED, patient is febrile to 101.3 F with WBC 15.8. Pharmacy has been consulted for vancomycin dosing.  Plan: Give vancomycin 2500 mg IV load x1 Start vancomycin 750 q24 hours (eAUC 459.8, Cmin 13.9, Scr 1.36, IBW used, Vd 0.5 L/kg) Monitor renal function, clinical status, culture data, and LOT  Height: 5' (152.4 cm) Weight: 119.3 kg (263 lb 0.1 oz) IBW/kg (Calculated) : 45.5  Temp (24hrs), Avg:101.3 F (38.5 C), Min:101.3 F (38.5 C), Max:101.3 F (38.5 C)  Recent Labs  Lab 04/10/23 0846 04/13/23 1846 04/13/23 1848  WBC 9.7 15.8*  --   CREATININE 1.28* 1.36*  --   LATICACIDVEN  --   --  1.3    Estimated Creatinine Clearance: 45.6 mL/min (A) (by C-G formula based on SCr of 1.36 mg/dL (H)).    Allergies  Allergen Reactions   Amoxicillin Anaphylaxis and Other (See Comments)    Has patient had a PCN reaction causing immediate rash, facial/tongue/throat swelling, SOB or lightheadedness with hypotension: Yes Has patient had a PCN reaction causing severe rash involving mucus membranes or skin necrosis: Yes Has patient had a PCN reaction that required hospitalization: Yes Has patient had a PCN reaction occurring within the last 10 years: Yes If all of the above answers are "NO", then may proceed with Cephalosporin use.    Hydrocodone Anaphylaxis   Percocet [Oxycodone-Acetaminophen] Other (See Comments)    Causes chest pain /tightness. Pressure pain around her ribs.   Depacon [Valproate Sodium]    Depacon [Valproic Acid] Other (See Comments)    Causes falls    Oxycodone    Oxycodone Hcl    Prednisone Itching   Antimicrobials this admission: ceftriaxone 12/29 >>  azithromycin 12/29 >>  Vancomycin 12/29 >>  Dose adjustments this admission:  N/A  Microbiology results: 12/29 BCx: pending 12/29 MRSA PCR: pending  Thank you for involving pharmacy in this patient's care.   Rockwell Alexandria, PharmD Clinical Pharmacist 04/13/2023 7:35 PM

## 2023-04-13 NOTE — ED Triage Notes (Signed)
Pt arrived [facility] from home with roommate. Roommate stated pt was not acting right . Pt usually ambulates with a cane but today she is too weak to walk and pt urinated on herself today. Pt unaware that she had done that. [facility] RA was 91 %. 2 lpm applied nasal cannula , CBG 162 Pt has strong urine smell.

## 2023-04-13 NOTE — Progress Notes (Signed)
CODE SEPSIS - PHARMACY COMMUNICATION  **Broad Spectrum Antibiotics should be administered within 1 hour of Sepsis diagnosis**  Time Code Sepsis Called/Page Received: 1916  Antibiotics Ordered: vancomycin, ceftriaxone, azithromycin  Time of 1st antibiotic administration: 1928  Additional action taken by pharmacy: None  If necessary, Name of Provider/Nurse Contacted: None    Norma [name] ,PharmD Clinical Pharmacist  04/13/2023  7:43 PM

## 2023-04-13 NOTE — H&P (Addendum)
History and Physical    Patient: Norma [name]:[id number] DOB: 1951-02-25 DOA: 04/13/2023 DOS: the patient was seen and examined on 04/13/2023 PCP: [name], MD  Patient coming from: Home  Chief Complaint:  Chief Complaint  Patient presents with   Altered Mental Status   HPI: Norma [name] is a 72 y.o. female with medical history significant of bipolar disorder, IDA, CKD 3, COPD on 2 L at night, CAD s/p CABG, anxiety and depression, T2DM, diabetic neuropathy, HLD, fatty liver, gastroparesis, GERD, hypothyroidism, IBS, morbid obesity, CVA, restless leg syndrome and chronic left foot wound who presents to the ED for evaluation of generalized weakness. Reports that she was around family members who had cold and cough symptoms around Christmas.  Over the last few days, she has had generalized weakness. Her roommate who often cooks for her was not around yesterday so patient did not get up out of bed all day. She reports being extremely fatigued and experience some occasional chills. She denies any cough, shortness of breath, chest pain, fever, abdominal pain, nausea, vomiting, dizziness, dysuria, hematuria, headache or foot pain.  Reports she is on 2 L [place] with her CPAP at night.  ED course: Initial vitals with temp 101.3, RR 20, HR 103, BP 165/89, SpO2 92% on room air, improved to 96% on 2 L. Labs show sodium 133, K+ 4.3, glucose 190, creatinine 1.36, WBC 15.8, Hgb 14.2, platelet 261, PT/INR 13.5/1.0, lactic acid 1.2, flu, RSV and COVID test negative, UA with significant glucosuria, mild hemoglobinuria, mild ketonuria, moderate proteinuria but no signs of infection. CXR with no acute intrathoracic process X-ray left foot with severe soft tissue swelling of the anterior and lateral aspect of the ankle and multifocal osteoarthritis but no acute fracture Sepsis protocol initiated in the ED and patient received IV vancomycin, Rocephin and azithromycin as well as 1 g of Tylenol and IV  Zofran [facility] was consulted for admission  Review of Systems: As mentioned in the history of present illness. All other systems reviewed and are negative. Past Medical History:  Diagnosis Date   Anemia    Anxiety    Bipolar disorder (HCC)    Bulging lumbar disc    L3-4   Chronic daily headache    Chronic low back pain 09/20/2014   CKD (chronic kidney disease)    stage 3   COPD (chronic obstructive pulmonary disease) (HCC)    Coronary artery disease    CABG 2019   Degenerative arthritis    Depression    Diabetes mellitus, type II (HCC)    Diabetic neuropathy (HCC) 09/16/2018   DM type 2 with diabetic peripheral neuropathy (HCC) 09/20/2014   Dyslipidemia    Dyspnea    Family history of adverse reaction to anesthesia    father had reaction to anesthesia medication per patient "they don't use it anymore"- unsure of reaction   Fatty liver    per pt report   Gastroparesis    GERD (gastroesophageal reflux disease)    Heart murmur    History of hiatal hernia    pt states she no longer has this   Hypertension    Hypothyroidism    IBS (irritable bowel syndrome)    LBBB (left bundle branch block)    Memory difficulty 09/20/2014   Morbid obesity (HCC)    Myocardial infarction (HCC)    per pt- prior to CABG   Neuropathy    Obstructive sleep apnea on CPAP    Renal insufficiency  Restless legs syndrome (RLS) 09/17/2012   Stroke (cerebrum) (HCC) 05/16/2017   Left parietal   Urticaria    Past Surgical History:  Procedure Laterality Date   ABDOMINAL HYSTERECTOMY     ARTHROSCOPY KNEE W/ DRILLING  06/2011   and Decemer of 2013.   Carpel tunnel  1980's   CATARACT EXTRACTION W/PHACO Right 03/21/2017   Procedure: CATARACT EXTRACTION PHACO AND INTRAOCULAR LENS PLACEMENT RIGHT EYE;  Surgeon: Fabio Pierce, MD;  Location: [facility];  Service: Ophthalmology;  Laterality: Right;  CDE: 2.91    CATARACT EXTRACTION W/PHACO Left 04/18/2017   Procedure: CATARACT EXTRACTION PHACO AND  INTRAOCULAR LENS PLACEMENT LEFT EYE;  Surgeon: Fabio Pierce, MD;  Location: [facility];  Service: Ophthalmology;  Laterality: Left;  left   CHOLECYSTECTOMY     COLONOSCOPY WITH PROPOFOL N/A 10/04/2016   Procedure: COLONOSCOPY WITH PROPOFOL;  Surgeon: Malissa Hippo, MD;  Location: [facility];  Service: Endoscopy;  Laterality: N/A;  11:10   CORONARY ARTERY BYPASS GRAFT N/A 08/29/2017   Procedure: CORONARY ARTERY BYPASS GRAFTING (CABG) x 3 WITH ENDOSCOPIC HARVESTING OF RIGHT SAPHENOUS VEIN;  Surgeon: Kerin Perna, MD;  Location: [facility] OR;  Service: Open Heart Surgery;  Laterality: N/A;   ESOPHAGOGASTRODUODENOSCOPY N/A 04/25/2016   Procedure: ESOPHAGOGASTRODUODENOSCOPY (EGD);  Surgeon: Malissa Hippo, MD;  Location: [facility];  Service: Endoscopy;  Laterality: N/A;  730   LEFT HEART CATH AND CORONARY ANGIOGRAPHY N/A 08/27/2017   Procedure: LEFT HEART CATH AND CORONARY ANGIOGRAPHY;  Surgeon: Tonny Bollman, MD;  Location: [facility] INVASIVE CV LAB;  Service: Cardiovascular::  pLAD 95% - p-mLAD 50%, ostD1 90% -pD1 80%; ostOM1 90%; rPDA 80%. EF ~50-55% - HK of dital Anterolateral & Apical wall.  - Rec CVTS c/s   NECK SURGERY     pt reports having growth removed from the back of her neck- after 2021   OPEN REDUCTION INTERNAL FIXATION (ORIF) HAND Left 04/10/2020   Procedure: OPEN REDUCTION INTERNAL FIXATION (ORIF) HAND, left index finger;  Surgeon: Vickki Hearing, MD;  Location: [facility];  Service: Orthopedics;  Laterality: Left;  0.045 k wires   RECTAL SURGERY     fissure   SHOULDER SURGERY Left    arthroscopy in March of this year   TEE WITHOUT CARDIOVERSION N/A 08/29/2017   Procedure: TRANSESOPHAGEAL ECHOCARDIOGRAM (TEE);  Surgeon: Donata Clay, Theron Arista, MD;  Location: [facility] OR;  Service: Open Heart Surgery;  Laterality: N/A;   TRANSFORAMINAL LUMBAR INTERBODY FUSION W/ MIS 1 LEVEL Right 07/12/2021   Procedure: Minimally Invasive Surgery Transforaminal Lumbar Interbody Fusion  Lumbar four-five;  Surgeon:  Bedelia Person, MD;  Location: [facility] OR;  Service: Neurosurgery;  Laterality: Right;   TRANSTHORACIC ECHOCARDIOGRAM  06/06/2017   Mild to moderate reduced EF 40 and 45%.  Anterior septal, inferoseptal and basal to mid inferior hypokinesis.  GR 1 DD.  No significant valvular lesion   Social History:  reports that she quit smoking about 52 years ago. Her smoking use included cigarettes. She has never used smokeless tobacco. She reports that she does not drink alcohol and does not use drugs.  Allergies  Allergen Reactions   Amoxicillin Anaphylaxis and Other (See Comments)    Has patient had a PCN reaction causing immediate rash, facial/tongue/throat swelling, SOB or lightheadedness with hypotension: Yes Has patient had a PCN reaction causing severe rash involving mucus membranes or skin necrosis: Yes Has patient had a PCN reaction that required hospitalization: Yes Has patient had a PCN reaction occurring within the  last 10 years: Yes If all of the above answers are "NO", then may proceed with Cephalosporin use.    Hydrocodone Anaphylaxis   Percocet [Oxycodone-Acetaminophen] Other (See Comments)    Causes chest pain /tightness. Pressure pain around her ribs.   Depacon [Valproate Sodium]    Depacon [Valproic Acid] Other (See Comments)    Causes falls    Oxycodone    Oxycodone Hcl    Prednisone Itching    Family History  Problem Relation Age of Onset   Hypertension Mother    Lymphoma Mother    Depression Mother    Arthritis Father    Alcohol abuse Father    Hypertension Sister    Cancer Brother        kidney and lung   Alcohol abuse Brother    Alcohol abuse Paternal Uncle    Alcohol abuse Paternal Grandfather    Alcohol abuse Paternal Grandmother    Allergic rhinitis Neg Hx    Angioedema Neg Hx    Asthma Neg Hx    Atopy Neg Hx    Eczema Neg Hx    Immunodeficiency Neg Hx    Urticaria Neg Hx     Prior to Admission medications   Medication Sig Start Date End Date Taking?  Authorizing Provider  acetaminophen (TYLENOL) 325 MG tablet Take 650 mg by mouth. Takes 3 tabs daily    [provider]  ALPRAZolam (XANAX) 0.25 MG tablet Take 1 tablet (0.25 mg total) by mouth at bedtime as needed for anxiety or sleep. 02/12/23   Myrlene Broker, MD  ARIPiprazole (ABILIFY) 5 MG tablet Take 1 tablet (5 mg total) by mouth daily. 02/12/23 02/12/24  Myrlene Broker, MD  aspirin EC 81 MG tablet Take 1 tablet (81 mg total) by mouth daily. 01/20/18   Antoine Poche, MD  azelastine (ASTELIN) 0.1 % nasal spray Place into both nostrils 2 (two) times daily. Use in each nostril as directed    [provider]  buPROPion (WELLBUTRIN XL) 150 MG 24 hr tablet Take 1 tablet (150 mg total) by mouth every morning. 02/12/23   Myrlene Broker, MD  carvedilol (COREG) 6.25 MG tablet Take 1 tablet (6.25 mg total) by mouth 2 (two) times daily with a meal. 12/30/22   Branch, Dorothe Pea, MD  Cholecalciferol (VITAMIN D3) 50 MCG (2000 UT) capsule Take 1 capsule (2,000 Units total) by mouth daily with lunch. 02/04/23   [name], MD  diclofenac Sodium (VOLTAREN) 1 % GEL Apply 2 g topically 4 (four) times daily as needed (pain).    [provider]  diphenhydrAMINE (BENADRYL) 25 mg capsule Take 25 mg by mouth as needed for allergies or itching.    [provider]  empagliflozin (JARDIANCE) 10 MG TABS tablet Take 1 tablet (10 mg total) by mouth daily with breakfast. 03/12/23   Nida, Denman George, MD  EPINEPHrine 0.3 mg/0.3 mL IJ SOAJ injection Inject 0.3 mg into the muscle once.    [provider]  Evolocumab (REPATHA SURECLICK) 140 MG/ML SOAJ INJECT 1 DOSE UNDER THE SKIN EVERY 14 DAYS 01/16/23   Hilty, Lisette Abu, MD  ferrous sulfate 325 (65 FE) MG EC tablet Take 325 mg by mouth. One daily    [provider]  gabapentin (NEURONTIN) 400 MG capsule Take 400 mg by mouth in the morning. & 800 mg at bedtime    [provider]  glipiZIDE  (GLUCOTROL XL) 5 MG 24 hr tablet Take 1 tablet (5 mg  total) by mouth daily with breakfast. 08/07/22   Nida, Denman George, MD  insulin glargine, 1 Unit Dial, (TOUJEO SOLOSTAR) 300 UNIT/ML Solostar Pen Inject 50 Units into the skin at bedtime. 02/04/23   [name], MD  lamoTRIgine (LAMICTAL) 150 MG tablet Take 1 tablet (150 mg total) by mouth at bedtime. 02/12/23   Myrlene Broker, MD  levothyroxine (SYNTHROID) 112 MCG tablet Take 112 mcg by mouth daily before breakfast.    [provider]  losartan (COZAAR) 25 MG tablet Take 25 mg by mouth daily. 11/04/22   [provider]  meclizine (ANTIVERT) 25 MG tablet Take 1 tablet (25 mg total) by mouth 3 (three) times daily as needed for dizziness. Patient taking differently: Take 25 mg by mouth daily as needed for dizziness. 03/29/14   Raeford Razor, MD  OVER THE COUNTER MEDICATION daily as needed (pain). CBD ointment/roll on    [provider]  pantoprazole (PROTONIX) 40 MG tablet Take 1 tablet by mouth daily before supper. 05/07/22   Carlan, Chelsea L, NP  rOPINIRole (REQUIP) 3 MG tablet TAKE ONE TABLET BY MOUTH EVERY MORNING, ,NOON AND AT BEDTIME. (PRT PT MORNING,EVENING,BEDTIME) Patient taking differently: Take 3 mg by mouth 3 (three) times daily. 01/11/21   York Spaniel, MD  tiZANidine (ZANAFLEX) 2 MG tablet Take 2 mg by mouth 2 (two) times daily. 05/07/22   [provider]  torsemide (DEMADEX) 20 MG tablet Take 40 mg by mouth daily. Patient not taking: Reported on 03/03/2023 07/23/22   [provider]  venlafaxine XR (EFFEXOR-XR) 150 MG 24 hr capsule TAKE TWO (2) CAPSULES BY MOUTH AT BEDTIME 02/12/23   Myrlene Broker, MD    Physical Exam: Vitals:   04/13/23 1930 04/13/23 1945 04/13/23 2000 04/13/23 2015  BP: 121/70 (!) 141/88 127/62 119/74  Pulse: 94 93 90 93  Resp: 20 20 15 20   Temp:      TempSrc:      SpO2: 95% 95% 96% 98%  Weight:      Height:       General: Pleasant,  chronically ill morbidly obese woman laying in bed. No acute distress. HEENT: [place]/AT. Anicteric sclera. Dry mucous membrane. CV: Mild tachycardia. Regular rhythm. No murmurs, rubs, or gallops. Mild edema of the left foot. Pulmonary: On 2 L [place]. Lungs CTAB. Normal effort. No wheezing or rales.Decreased breath sounds throughout. Abdominal: Soft, nontender, nondistended. Normal bowel sounds. Extremities: Palpable radial and DP pulses. Small healed wound with surrounding granulation tissue underneath the left big toe. No erythema, tenderness or drainage. (See media tab) Skin: Warm and dry.  Neuro: A&Ox3. Moves all extremities. Normal sensation to light touch. No focal deficit. Psych: Normal mood and affect  Data Reviewed: Labs show sodium 133, K+ 4.3, glucose 190, creatinine 1.36, WBC 15.8, Hgb 14.2, platelet 261, PT/INR 13.5/1.0, lactic acid 1.2, flu, RSV and COVID test negative, UA with significant glucosuria, mild hemoglobinuria, mild ketonuria, moderate proteinuria but no signs of infection. EKG shows sinus tach with LBBB CXR with no acute intrathoracic process X-ray left foot with severe soft tissue swelling of the anterior and lateral aspect of the ankle and multifocal osteoarthritis but no acute fracture  Assessment and Plan: Norma [name] is a 72 y.o. female with medical history significant of bipolar disorder, IDA, CKD 3, COPD on 2 L at night, CAD s/p CABG, anxiety and depression, T2DM, diabetic neuropathy, HLD, fatty liver, gastroparesis, GERD, hypothyroidism, IBS, morbid obesity, CVA, restless leg syndrome and chronic left foot  wound who presents to the ED for evaluation of generalized weakness and admitted for sepsis rule out.   # Sepsis rule out # SIRS Chronically ill patient presented with few days of generalized weakness and chills found to be febrile, tachycardic with leukocytosis. She has no respiratory, abdominal or urinary symptoms. UA negative for infection. COVID, flu and RSV  negative. CXR does not show any signs of pneumonia. No source of infection identified so far. Aspiration pneumonia possible as patient reports being in bed all day yesterday however procalcitonin only elevated to 0.17. An acute viral infection also possible. S/p IV Vanc, Rocephin and azithromycin in the ED. -Continue IV vancomycin and Rocephin -Check full respiratory panel -Trend CBC, fever curve  # T2DM # Diabetic neuropathy Last A1c 7.8% 2 months ago. CBG in the 190s. -Semglee 25 units at bedtime -Continue Jardiance and glipizide -Continue gabapentin -SSI with meals and at bedtime, CBG monitoring  # HTN BP stable with SBP in the 110s to 120s -Continue losartan  # Chronic left foot wound Follows with wound care in the outpatient. Next follow-up in mid January due to significant improvement in wound. X-ray of the foot shows severe soft tissue swelling around the ankle but does not show any signs of osteomyelitis. -Follow-up with wound care in the outpatient  # CKD 3B Creatinine around her baseline. Status post 1 L IVNS. -Trend renal function -Avoid nephrotoxic agents  # CAD s/p CABG # HLD -Continue aspirin and Repatha  # Hypothyroidism -Continue levothyroxine -Follow-up repeat TSH  # COPD on 2 L at night # OSA on CPAP No respiratory distress or wheezing on exam. -Continue supplemental O2 -CPAP at night  # Anxiety and depression -Continue Abilify, Wellbutrin, and venlafaxine -Continue as needed Xanax  # Bipolar disorder -Continue lamotrigine  # Restless leg syndrome -Continue ropinirole  # GERD -Continue Protonix  # General Debility -PT/OT eval and treat -Check vitamin D  # Class III obesity Body mass index is 51.37 kg/m. -PCP follow-up for nutrition and weight loss counseling    Advance Care Planning:   Code Status: Limited: Do not attempt resuscitation (DNR) -DNR-LIMITED -Do Not Intubate/DNI    Consults: None  Family Communication: No family at  bedside  Severity of Illness: The appropriate patient status for this patient is OBSERVATION. Observation status is judged to be reasonable and necessary in order to provide the required intensity of service to ensure the patient's safety. The patient's presenting symptoms, physical exam findings, and initial radiographic and laboratory data in the context of their medical condition is felt to place them at decreased risk for further clinical deterioration. Furthermore, it is anticipated that the patient will be medically stable for discharge from the hospital within 2 midnights of admission.   Author: Steffanie Rainwater, MD 04/13/2023 8:50 PM  For on call review [email or web address].

## 2023-04-13 NOTE — ED Provider Notes (Signed)
[place] EMERGENCY DEPARTMENT AT [facility] Provider Note   CSN: [id number] Arrival date & time: 04/13/23  1759     History  Chief Complaint  Patient presents with   Altered Mental Status    Norma [name] is a 72 y.o. female.  72 year old female with a history of COPD on nightly oxygen, CKD, CAD status post CABG, diabetes, obesity, chronic abdominal and left foot wound who presents emergency department with generalized weakness.  Today patient has had extreme fatigue and weakness.  EMS found that she had a fever as well.  She tells me that she has been around several sick contacts and has been coughing recently.  No runny nose or sore throat.  Typically only wears oxygen at night but EMS placed her on 2 L nasal cannula.  Says that she is getting a wound on her left foot evaluated and cared for at the wound care clinic.  No new pain in that area.       Home Medications Prior to Admission medications   Medication Sig Start Date End Date Taking? Authorizing Provider  acetaminophen (TYLENOL) 500 MG tablet Take 1,000 mg by mouth daily as needed for moderate pain (pain score 4-6), fever or headache.   Yes [provider]  ALPRAZolam (XANAX) 0.25 MG tablet Take 1 tablet (0.25 mg total) by mouth at bedtime as needed for anxiety or sleep. 02/12/23  Yes Myrlene Broker, MD  ARIPiprazole (ABILIFY) 5 MG tablet Take 1 tablet (5 mg total) by mouth daily. 02/12/23 02/12/24 Yes Myrlene Broker, MD  aspirin EC 81 MG tablet Take 1 tablet (81 mg total) by mouth daily. 01/20/18  Yes BranchDorothe Pea, MD  azelastine (ASTELIN) 0.1 % nasal spray Place 1 spray into both nostrils 2 (two) times daily as needed for allergies.   Yes [provider]  buPROPion (WELLBUTRIN XL) 150 MG 24 hr tablet Take 1 tablet (150 mg total) by mouth every morning. 02/12/23  Yes Myrlene Broker, MD  carvedilol (COREG) 6.25 MG tablet Take 1 tablet (6.25 mg total) by mouth 2 (two) times daily with  a meal. 12/30/22  Yes Branch, Dorothe Pea, MD  Cholecalciferol (VITAMIN D3) 50 MCG (2000 UT) capsule Take 1 capsule (2,000 Units total) by mouth daily with lunch. 02/04/23  Yes Nida, Denman George, MD  diclofenac Sodium (VOLTAREN) 1 % GEL Apply 2 g topically 4 (four) times daily as needed (pain).   Yes [provider]  doxycycline (VIBRA-TABS) 100 MG tablet Take 1 tablet (100 mg total) by mouth 2 (two) times daily for 5 days. 04/14/23 04/19/23 Yes [name], MD  Evolocumab (REPATHA SURECLICK) 140 MG/ML SOAJ INJECT 1 DOSE UNDER THE SKIN EVERY 14 DAYS 01/16/23  Yes Hilty, Lisette Abu, MD  gabapentin (NEURONTIN) 400 MG capsule Take 400-800 mg by mouth See admin instructions. Take 1 capsule (400mg ) every morning and 2 capsules (800mg ) at bedtime.   Yes [provider]  insulin glargine, 1 Unit Dial, (TOUJEO SOLOSTAR) 300 UNIT/ML Solostar Pen Inject 50 Units into the skin at bedtime. 02/04/23  Yes [name], MD  lamoTRIgine (LAMICTAL) 150 MG tablet Take 1 tablet (150 mg total) by mouth at bedtime. 02/12/23  Yes Myrlene Broker, MD  levothyroxine (SYNTHROID) 112 MCG tablet Take 112 mcg by mouth daily.   Yes [provider]  OVER THE COUNTER MEDICATION Apply 1 application  topically daily as needed (pain). CBD salve   Yes [provider]  pantoprazole (PROTONIX)  40 MG tablet Take 1 tablet by mouth daily before supper. Patient taking differently: Take 40 mg by mouth at bedtime. 05/07/22  Yes Carlan, Chelsea L, NP  rOPINIRole (REQUIP) 3 MG tablet TAKE ONE TABLET BY MOUTH EVERY MORNING, ,NOON AND AT BEDTIME. (PRT PT MORNING,EVENING,BEDTIME) Patient taking differently: Take 3-6 mg by mouth See admin instructions. Take 1 tablet (3mg ) every morning and 2 tablets (6mg ) at bedtime. 01/11/21  Yes York Spaniel, MD  venlafaxine XR (EFFEXOR-XR) 150 MG 24 hr capsule TAKE TWO (2) CAPSULES BY MOUTH AT BEDTIME 02/12/23  Yes Myrlene Broker, MD  empagliflozin (JARDIANCE) 10  MG TABS tablet Take 5 mg daily 04/14/23   [name], MD      Allergies    Amoxil [amoxicillin], Hydrocodone, Percocet [oxycodone-acetaminophen], Depacon [valproic acid], Prednisone, and Roxicodone [oxycodone]    Review of Systems   Review of Systems  Physical Exam Updated Vital Signs BP (!) 85/45 (BP Location: Left Arm)   Pulse 74   Temp 98.3 F (36.8 C) (Oral)   Resp 20   Ht 5' (1.524 m)   Wt 122.9 kg   SpO2 95%   BMI 52.92 kg/m  Physical Exam Vitals and nursing note reviewed.  Constitutional:      General: She is not in acute distress.    Appearance: She is well-developed. She is ill-appearing.     Comments: On 2 L nasal  HENT:     Head: Normocephalic and atraumatic.     Right Ear: External ear normal.     Left Ear: External ear normal.     Nose: Nose normal.  Eyes:     Extraocular Movements: Extraocular movements intact.     Conjunctiva/sclera: Conjunctivae normal.     Pupils: Pupils are equal, round, and reactive to light.  Cardiovascular:     Rate and Rhythm: Normal rate and regular rhythm.     Heart sounds: No murmur heard. Pulmonary:     Effort: Pulmonary effort is normal. No respiratory distress.     Breath sounds: Normal breath sounds.     Comments: Limited due to habitus Abdominal:     General: Abdomen is flat. There is no distension.     Palpations: Abdomen is soft. There is no mass.     Tenderness: There is no abdominal tenderness. There is no guarding.     Comments: Small wound on lower abdomen with eschar present  Musculoskeletal:     Cervical back: Normal range of motion and neck supple.     Right lower leg: No edema.     Left lower leg: No edema.     Comments: Wound on left foot see image below.  No drainage noted  Skin:    General: Skin is warm and dry.  Neurological:     Mental Status: She is alert. Mental status is at baseline.  Psychiatric:        Mood and Affect: Mood normal.    Left lower extremity:  ED Results / Procedures /  Treatments   Labs (all labs ordered are listed, but only abnormal results are displayed) Labs Reviewed  COMPREHENSIVE METABOLIC PANEL - Abnormal; Notable for the following components:      Result Value   Sodium 133 (*)    Glucose, Bld 190 (*)    Creatinine, Ser 1.36 (*)    Total Protein 8.2 (*)    GFR, Estimated 42 (*)    All other components within normal limits  CBC - Abnormal; Notable for  the following components:   WBC 15.8 (*)    All other components within normal limits  URINALYSIS, W/ REFLEX TO CULTURE (INFECTION SUSPECTED) - Abnormal; Notable for the following components:   Glucose, UA >=500 (*)    Hgb urine dipstick SMALL (*)    Ketones, ur 5 (*)    Protein, ur 100 (*)    All other components within normal limits  C-REACTIVE PROTEIN - Abnormal; Notable for the following components:   CRP 7.5 (*)    All other components within normal limits  SEDIMENTATION RATE - Abnormal; Notable for the following components:   Sed Rate 35 (*)    All other components within normal limits  CBC - Abnormal; Notable for the following components:   WBC 11.5 (*)    All other components within normal limits  BASIC METABOLIC PANEL - Abnormal; Notable for the following components:   Sodium 134 (*)    Glucose, Bld 186 (*)    Creatinine, Ser 1.36 (*)    Calcium 8.5 (*)    GFR, Estimated 42 (*)    All other components within normal limits  GLUCOSE, CAPILLARY - Abnormal; Notable for the following components:   Glucose-Capillary 156 (*)    All other components within normal limits  GLUCOSE, CAPILLARY - Abnormal; Notable for the following components:   Glucose-Capillary 164 (*)    All other components within normal limits  GLUCOSE, CAPILLARY - Abnormal; Notable for the following components:   Glucose-Capillary 121 (*)    All other components within normal limits  CBG MONITORING, ED - Abnormal; Notable for the following components:   Glucose-Capillary 198 (*)    All other components within  normal limits  CBG MONITORING, ED - Abnormal; Notable for the following components:   Glucose-Capillary 179 (*)    All other components within normal limits  CULTURE, BLOOD (ROUTINE X 2)  CULTURE, BLOOD (ROUTINE X 2)  RESP PANEL BY RT-PCR (RSV, FLU A&B, COVID)  RVPGX2  RESPIRATORY PANEL BY PCR  LACTIC ACID, PLASMA  LACTIC ACID, PLASMA  PROTIME-INR  PROCALCITONIN  HIV ANTIBODY (ROUTINE TESTING W REFLEX)  TSH  VITAMIN D 25 HYDROXY (VIT D DEFICIENCY, FRACTURES)  VITAMIN B12    EKG EKG Interpretation Date/Time:  Sunday April 13 2023 18:45:54 EST Ventricular Rate:  104 PR Interval:  117 QRS Duration:  155 QT Interval:  396 QTC Calculation: 521 R Axis:   -7  Text Interpretation: Sinus tachycardia Left bundle branch block When compared to 07/05/21 tachycardia now present Confirmed by Vonita Moss [phone]) on 04/13/2023 11:57:03 PM  Radiology DG Foot Complete Left Result Date: 04/13/2023 CLINICAL DATA:  Left foot swelling EXAM: LEFT FOOT - COMPLETE 3+ VIEW COMPARISON:  None Available. FINDINGS: Frontal, oblique, and lateral views of the left foot are obtained. There is severe swelling of the anterolateral aspect of the left ankle. No acute fracture, subluxation, or dislocation. Multifocal osteoarthritis greatest within the midfoot and tibiotalar joint. Prominent inferior calcaneal spur. IMPRESSION: 1. Multifocal osteoarthritis. 2. Severe soft tissue swelling of the anterior and lateral aspect of the ankle. 3. No acute fracture. Electronically Signed   By: Sharlet Salina M.D.   On: 04/13/2023 19:56   DG Chest 1 View Result Date: 04/13/2023 CLINICAL DATA:  Sepsis, weakness EXAM: CHEST  1 VIEW COMPARISON:  09/25/2022 FINDINGS: Single frontal view of the chest demonstrates an unremarkable cardiac silhouette. No acute airspace disease, effusion, or pneumothorax. No acute bony abnormality. IMPRESSION: 1. No acute intrathoracic process. Electronically Signed  By: Sharlet Salina M.D.   On:  04/13/2023 19:54    Procedures Procedures    Medications Ordered in ED Medications  vancomycin (VANCOREADY) IVPB 750 mg/150 mL (has no administration in time range)  enoxaparin (LOVENOX) injection 60 mg (has no administration in time range)  acetaminophen (TYLENOL) tablet 650 mg (650 mg Oral Given 04/14/23 0659)    Or  acetaminophen (TYLENOL) suppository 650 mg ( Rectal See Alternative 04/14/23 0659)  senna-docusate (Senokot-S) tablet 1 tablet (has no administration in time range)  ondansetron (ZOFRAN) tablet 4 mg (has no administration in time range)    Or  ondansetron (ZOFRAN) injection 4 mg (has no administration in time range)  insulin aspart (novoLOG) injection 0-15 Units (2 Units Subcutaneous Given 04/14/23 1200)  insulin aspart (novoLOG) injection 0-5 Units ( Subcutaneous Not Given 04/13/23 2255)  ALPRAZolam (XANAX) tablet 0.25 mg (has no administration in time range)  ARIPiprazole (ABILIFY) tablet 5 mg (5 mg Oral Given 04/14/23 0846)  aspirin EC tablet 81 mg (81 mg Oral Given 04/14/23 0845)  buPROPion (WELLBUTRIN XL) 24 hr tablet 150 mg (150 mg Oral Given 04/14/23 0845)  carvedilol (COREG) tablet 6.25 mg (6.25 mg Oral Given 04/14/23 0845)  cholecalciferol (VITAMIN D3) 25 MCG (1000 UNIT) tablet 2,000 Units (2,000 Units Oral Given 04/14/23 1318)  diphenhydrAMINE (BENADRYL) capsule 25 mg (has no administration in time range)  gabapentin (NEURONTIN) capsule 400 mg (400 mg Oral Given 04/14/23 0949)  glipiZIDE (GLUCOTROL XL) 24 hr tablet 5 mg (5 mg Oral Given 04/14/23 0845)  lamoTRIgine (LAMICTAL) tablet 150 mg (150 mg Oral Given 04/14/23 0036)  levothyroxine (SYNTHROID) tablet 112 mcg (112 mcg Oral Given 04/14/23 0642)  pantoprazole (PROTONIX) EC tablet 40 mg (has no administration in time range)  rOPINIRole (REQUIP) tablet 3 mg (3 mg Oral Given 04/14/23 0845)  venlafaxine XR (EFFEXOR-XR) 24 hr capsule 300 mg (300 mg Oral Given 04/14/23 0037)  insulin glargine-yfgn (SEMGLEE)  injection 25 Units (25 Units Subcutaneous Given 04/14/23 0046)  cefTRIAXone (ROCEPHIN) 2 g in sodium chloride 0.9 % 100 mL IVPB (has no administration in time range)  empagliflozin (JARDIANCE) tablet 10 mg (has no administration in time range)  acetaminophen (TYLENOL) tablet 1,000 mg (1,000 mg Oral Given 04/13/23 1929)  cefTRIAXone (ROCEPHIN) 2 g in sodium chloride 0.9 % 100 mL IVPB (0 g Intravenous Stopped 04/13/23 1958)  azithromycin (ZITHROMAX) 500 mg in sodium chloride 0.9 % 250 mL IVPB (0 mg Intravenous Stopped 04/13/23 2031)  vancomycin (VANCOREADY) IVPB 1500 mg/300 mL (0 mg Intravenous Stopped 04/13/23 2157)    And  vancomycin (VANCOCIN) IVPB 1000 mg/200 mL premix (0 mg Intravenous Stopped 04/13/23 2311)  ondansetron (ZOFRAN) injection 4 mg (4 mg Intravenous Given 04/13/23 1956)  sodium chloride 0.9 % bolus 1,000 mL (0 mLs Intravenous Stopping previously hung infusion 04/13/23 2355)    ED Course/ Medical Decision Making/ A&P Clinical Course as of 04/14/23 1656  Sun Apr 13, 2023  2046 Dr Kirke Corin from hospitalist to admit [RP]    Clinical Course User Index [RP] Rondel Baton, MD                                 Medical Decision Making Amount and/or Complexity of Data Reviewed Labs: ordered. Radiology: ordered.  Risk OTC drugs. Prescription drug management. Decision regarding hospitalization.   Norma [name] is a 72 y.o. female with comorbidities that complicate the patient evaluation including COPD on nightly oxygen, CKD,  CAD status post CABG, diabetes, obesity, chronic abdominal and left foot wound who presents emergency department with generalized weakness.   Initial Ddx:  Sepsis, septic shock, URI, pneumonia, osteomyelitis  MDM/Course:  Patient presents to the emergency department with generalized weakness.  On exam is febrile, tachycardic, and hypoxic.  Concerned about sepsis so code sepsis was activated.  She was started on broad-spectrum antibiotics to cover  her for pneumonia and/or osteomyelitis of the foot.  Chest x-ray without pneumonia.  COVID and flu are negative.  Suspect that she may have an occult pneumonia or osteo of her foot so was admitted to hospitalist for further management.  May benefit from MRI of the foot at some point in time.  Upon re-evaluation patient was stable.  Lactate WNL and she is hemodynamically stable also will hold off on fluid bolus at this time.  This patient presents to the ED for concern of complaints listed in HPI, this involves an extensive number of treatment options, and is a complaint that carries with it a high risk of complications and morbidity. Disposition including potential need for admission considered.   Dispo: Admit to Floor  Additional history obtained from family Records reviewed Outpatient Clinic Notes The following labs were independently interpreted: Chemistry and show no acute abnormality I independently reviewed the following imaging with scope of interpretation limited to determining acute life threatening conditions related to emergency care: Chest x-ray and agree with the radiologist interpretation with the following exceptions: none I personally reviewed and interpreted cardiac monitoring: sinus tachycardia I personally reviewed and interpreted the pt's EKG: see above for interpretation  I have reviewed the patients home medications and made adjustments as needed Consults: Hospitalist Social Determinants of health:  Elderly  Portions of this note were generated with Scientist, clinical (histocompatibility and immunogenetics). Dictation errors may occur despite best attempts at proofreading.     Final Clinical Impression(s) / ED Diagnoses Final diagnoses:  Sepsis, due to unspecified organism, unspecified whether acute organ dysfunction present [facility])  Generalized weakness  Hypoxia    Rx / DC Orders ED Discharge Orders          Ordered    doxycycline (VIBRA-TABS) 100 MG tablet  2 times daily        04/14/23 1431     empagliflozin (JARDIANCE) 10 MG TABS tablet        04/14/23 1431    Increase activity slowly        04/14/23 1431    Diet - low sodium heart healthy        04/14/23 1431    Discharge instructions       Comments: Take medications as prescribed Maintain adequate hydration Follow heart healthy/modified carbohydrate diet Arrange follow-up with PCP in 10 days   04/14/23 1431    Discharge wound care:       Comments: [place] show admission wound care instructions and continue to follow-up with wound care service as previously Arranged   04/14/23 1431              Rondel Baton, MD 04/14/23 1657

## 2023-04-14 DIAGNOSIS — I110 Hypertensive heart disease with heart failure: Secondary | ICD-10-CM | POA: Diagnosis not present

## 2023-04-14 DIAGNOSIS — J449 Chronic obstructive pulmonary disease, unspecified: Secondary | ICD-10-CM | POA: Diagnosis not present

## 2023-04-14 DIAGNOSIS — J40 Bronchitis, not specified as acute or chronic: Secondary | ICD-10-CM

## 2023-04-14 DIAGNOSIS — L03116 Cellulitis of left lower limb: Secondary | ICD-10-CM | POA: Diagnosis not present

## 2023-04-14 DIAGNOSIS — Z7982 Long term (current) use of aspirin: Secondary | ICD-10-CM | POA: Diagnosis not present

## 2023-04-14 DIAGNOSIS — E1121 Type 2 diabetes mellitus with diabetic nephropathy: Secondary | ICD-10-CM | POA: Diagnosis not present

## 2023-04-14 DIAGNOSIS — E11621 Type 2 diabetes mellitus with foot ulcer: Secondary | ICD-10-CM | POA: Diagnosis not present

## 2023-04-14 DIAGNOSIS — E1140 Type 2 diabetes mellitus with diabetic neuropathy, unspecified: Secondary | ICD-10-CM | POA: Diagnosis not present

## 2023-04-14 DIAGNOSIS — A419 Sepsis, unspecified organism: Secondary | ICD-10-CM | POA: Diagnosis not present

## 2023-04-14 DIAGNOSIS — M109 Gout, unspecified: Secondary | ICD-10-CM | POA: Diagnosis not present

## 2023-04-14 DIAGNOSIS — Z8673 Personal history of transient ischemic attack (TIA), and cerebral infarction without residual deficits: Secondary | ICD-10-CM | POA: Diagnosis not present

## 2023-04-14 DIAGNOSIS — I509 Heart failure, unspecified: Secondary | ICD-10-CM | POA: Diagnosis not present

## 2023-04-14 DIAGNOSIS — Z794 Long term (current) use of insulin: Secondary | ICD-10-CM | POA: Diagnosis not present

## 2023-04-14 DIAGNOSIS — E039 Hypothyroidism, unspecified: Secondary | ICD-10-CM | POA: Diagnosis not present

## 2023-04-14 DIAGNOSIS — R6510 Systemic inflammatory response syndrome (SIRS) of non-infectious origin without acute organ dysfunction: Secondary | ICD-10-CM | POA: Diagnosis not present

## 2023-04-14 DIAGNOSIS — Z9981 Dependence on supplemental oxygen: Secondary | ICD-10-CM | POA: Diagnosis not present

## 2023-04-14 DIAGNOSIS — D1724 Benign lipomatous neoplasm of skin and subcutaneous tissue of left leg: Secondary | ICD-10-CM | POA: Diagnosis not present

## 2023-04-14 DIAGNOSIS — Z7984 Long term (current) use of oral hypoglycemic drugs: Secondary | ICD-10-CM | POA: Diagnosis not present

## 2023-04-14 DIAGNOSIS — Z604 Social exclusion and rejection: Secondary | ICD-10-CM | POA: Diagnosis not present

## 2023-04-14 DIAGNOSIS — Z87891 Personal history of nicotine dependence: Secondary | ICD-10-CM | POA: Diagnosis not present

## 2023-04-14 DIAGNOSIS — L97521 Non-pressure chronic ulcer of other part of left foot limited to breakdown of skin: Secondary | ICD-10-CM | POA: Diagnosis not present

## 2023-04-14 LAB — CBC
HCT: 39.7 % (ref 36.0–46.0)
Hemoglobin: 13.1 g/dL (ref 12.0–15.0)
MCH: 31.3 pg (ref 26.0–34.0)
MCHC: 33 g/dL (ref 30.0–36.0)
MCV: 94.7 fL (ref 80.0–100.0)
Platelets: 215 10*3/uL (ref 150–400)
RBC: 4.19 MIL/uL (ref 3.87–5.11)
RDW: 13.1 % (ref 11.5–15.5)
WBC: 11.5 10*3/uL — ABNORMAL HIGH (ref 4.0–10.5)
nRBC: 0 % (ref 0.0–0.2)

## 2023-04-14 LAB — RESPIRATORY PANEL BY PCR

## 2023-04-14 LAB — BASIC METABOLIC PANEL
Anion gap: 10 (ref 5–15)
BUN: 22 mg/dL (ref 8–23)
CO2: 22 mmol/L (ref 22–32)
Calcium: 8.5 mg/dL — ABNORMAL LOW (ref 8.9–10.3)
Chloride: 102 mmol/L (ref 98–111)
Creatinine, Ser: 1.36 mg/dL — ABNORMAL HIGH (ref 0.44–1.00)
GFR, Estimated: 42 mL/min — ABNORMAL LOW (ref >60)
Glucose, Bld: 186 mg/dL — ABNORMAL HIGH (ref 70–99)
Potassium: 3.8 mmol/L (ref 3.5–5.1)
Sodium: 134 mmol/L — ABNORMAL LOW (ref 135–145)

## 2023-04-14 LAB — VITAMIN B12: Vitamin B-12: 251 pg/mL (ref 180–914)

## 2023-04-14 LAB — HIV ANTIBODY (ROUTINE TESTING W REFLEX): HIV Screen 4th Generation wRfx: NONREACTIVE

## 2023-04-14 LAB — GLUCOSE, CAPILLARY
Glucose-Capillary: 121 mg/dL — ABNORMAL HIGH (ref 70–99)
Glucose-Capillary: 156 mg/dL — ABNORMAL HIGH (ref 70–99)
Glucose-Capillary: 164 mg/dL — ABNORMAL HIGH (ref 70–99)

## 2023-04-14 LAB — VITAMIN D 25 HYDROXY (VIT D DEFICIENCY, FRACTURES): Vit D, 25-Hydroxy: 42.17 ng/mL (ref 30–100)

## 2023-04-14 LAB — TSH: TSH: 1.304 u[IU]/mL (ref 0.350–4.500)

## 2023-04-14 LAB — C-REACTIVE PROTEIN: CRP: 7.5 mg/dL — ABNORMAL HIGH (ref <1.0)

## 2023-04-14 MED ORDER — DOXYCYCLINE HYCLATE 100 MG PO TABS: 100.0000 mg | ORAL_TABLET | Freq: Two times a day (BID) | ORAL | 0 refills | Status: AC

## 2023-04-14 MED ORDER — EMPAGLIFLOZIN 10 MG PO TABS: 10.0000 mg | ORAL_TABLET | ORAL | Status: DC

## 2023-04-14 MED ORDER — EMPAGLIFLOZIN 10 MG PO TABS: ORAL_TABLET | ORAL | Status: DC

## 2023-04-14 NOTE — Care Management Obs Status (Signed)
MEDICARE OBSERVATION STATUS NOTIFICATION   Patient Details  Name: Norma [name] MRN: [id number] Date of Birth: 11-Jul-1950   Medicare Observation Status Notification Given:  Yes    Villa Herb, LCSWA 04/14/2023, 3:06 PM

## 2023-04-14 NOTE — Progress Notes (Signed)
   04/14/23 0421  Vitals  Temp 99.5 F (37.5 C)  Temp Source Oral  BP (!) 95/40  MAP (mmHg) (!) 56  BP Location Right Arm  BP Method Automatic  Patient Position (if appropriate) Lying  Pulse Rate 93  Pulse Rate Source Dinamap  Resp (!) 26  MEWS COLOR  MEWS Score Color Yellow  Oxygen Therapy  SpO2 93 %  O2 Device Room Air  Height and Weight  Weight 122.9 kg  Type of Scale Used Bed (per nurse due to patients weakness)  Type of Weight Actual  BMI (Calculated) 52.92  MEWS Score  MEWS Temp 0  MEWS Systolic 1  MEWS Pulse 0  MEWS RR 2  MEWS LOC 0  MEWS Score 3  Provider Notification  Provider Name/Title Adefeso  Date Provider Notified 04/14/23  Time Provider Notified [phone]  Method of Notification  (Secure chat)  Notification Reason Other (Comment) (Yellow Mews. RR elevated. BP + map low.)     Manual obtained, 102/46 on L arm. On call provider Adefeso made aware. Awaiting orders at this time.

## 2023-04-14 NOTE — Discharge Summary (Signed)
Physician Discharge Summary   Patient: Norma [name] MRN: [id number] DOB: 1950/11/06  Admit date:     04/13/2023  Discharge date: 04/14/23  Discharge Physician: [name]   PCP: [name], MD   Recommendations at discharge:  Reassess blood pressure and adjust medication as needed. Repeat basic metabolic panel to follow electrolytes and renal function Continue assisting patient with weight loss management.   Discharge Diagnoses: Principal Problem:   Sepsis (HCC) Active Problems:   Type 2 diabetes with nephropathy (HCC)   Obesity, Class III, BMI 40-49.9 (morbid obesity) (HCC)   SIRS (systemic inflammatory response syndrome) (HCC)   Bronchitis   Left leg cellulitis  Brief Hospital admission course: As per H&P written by Dr. Kirke Corin on 04/13/2023 Norma [name] is a 72 y.o. female with medical history significant of bipolar disorder, IDA, CKD 3, COPD on 2 L at night, CAD s/p CABG, anxiety and depression, T2DM, diabetic neuropathy, HLD, fatty liver, gastroparesis, GERD, hypothyroidism, IBS, morbid obesity, CVA, restless leg syndrome and chronic left foot wound who presents to the ED for evaluation of generalized weakness. Reports that she was around family members who had cold and cough symptoms around Christmas.  Over the last few days, she has had generalized weakness. Her roommate who often cooks for her was not around yesterday so patient did not get up out of bed all day. She reports being extremely fatigued and experience some occasional chills. She denies any cough, shortness of breath, chest pain, fever, abdominal pain, nausea, vomiting, dizziness, dysuria, hematuria, headache or foot pain.  Reports she is on 2 L [place] with her CPAP at night.   ED course: Initial vitals with temp 101.3, RR 20, HR 103, BP 165/89, SpO2 92% on room air, improved to 96% on 2 L. Labs show sodium 133, K+ 4.3, glucose 190, creatinine 1.36, WBC 15.8, Hgb 14.2, platelet 261, PT/INR 13.5/1.0, lactic  acid 1.2, flu, RSV and COVID test negative, UA with significant glucosuria, mild hemoglobinuria, mild ketonuria, moderate proteinuria but no signs of infection. CXR with no acute intrathoracic process X-ray left foot with severe soft tissue swelling of the anterior and lateral aspect of the ankle and multifocal osteoarthritis but no acute fracture Sepsis protocol initiated in the ED and patient received IV vancomycin, Rocephin and azithromycin as well as 1 g of Tylenol and IV Zofran TRH was consulted for admission  Assessment and Plan: 1-SIRS -With concerns for mild left leg superimposed cellulitis and may be bronchitis; no specific source of infection isolated -patient has remained hemodynamically stable and not spiking any fevers at time of discharge -Agrees to go home with oral doxycycline to complete 5 more days and follow-up with PCP. -Sepsis rule out.  2-type 2 diabetes with nephropathy/neuropathy -Resume home insulin therapy -Recent A1c 7.8 -Continue Jardiance as per nephrology service recommendation (using only 5 mg daily). -Modified carbohydrate diet discussed with patient. -Will continue the use of gabapentin -Given chronic renal failure we will hold glipizide at discharge.  3-hypertension -Continue home antihypertensive agents with exception for losartan due to soft blood pressure at time of discharge. -Patient advised to maintain adequate hydration and to follow heart healthy/low-sodium diet -PCP to reassess blood pressure and adjust medication as required.  4-chronic kidney disease stage IIIb -Continue follow-up with nephrology service. -Avoid the use of nephrotoxic agents and maintain adequate hydration.  5-chronic left foot wound -With concern for mild superimposed cellulitic process -5 days of doxycycline prescribed at discharge -Continue patient follow-up with wound care service.  6-morbid obesity -Low-calorie diet and portion control discussed with patient -Body  mass index is 52.92 kg/m.   7-COPD/obstructive sleep apnea -Continue 2 L at night and the use of CPAP.  8-anxiety/depression  -continue Abilify, Wellbutrin and Effexor. -No suicidal ideation or hallucination.  9-GERD -Continue PPI.  Consultants: None Procedures performed: See below for x-ray report. Disposition: Home Diet recommendation: Heart healthy/low-sodium and modified carbohydrate diet.  DISCHARGE MEDICATION: Allergies as of 04/14/2023       Reactions   Amoxil [amoxicillin] Anaphylaxis   Hydrocodone Anaphylaxis   Percocet [oxycodone-acetaminophen] Other (See Comments)   Causes chest pain /tightness. Pressure pain around her ribs.   Depacon [valproic Acid] Other (See Comments)   Causes falls    Prednisone Itching   Roxicodone [oxycodone] Other (See Comments)   Chest pain, tightness        Medication List     STOP taking these medications    glipiZIDE 5 MG 24 hr tablet Commonly known as: GLUCOTROL XL   losartan 25 MG tablet Commonly known as: COZAAR   tiZANidine 2 MG tablet Commonly known as: ZANAFLEX       TAKE these medications    acetaminophen 500 MG tablet Commonly known as: TYLENOL Take 1,000 mg by mouth daily as needed for moderate pain (pain score 4-6), fever or headache.   ALPRAZolam 0.25 MG tablet Commonly known as: XANAX Take 1 tablet (0.25 mg total) by mouth at bedtime as needed for anxiety or sleep.   ARIPiprazole 5 MG tablet Commonly known as: ABILIFY Take 1 tablet (5 mg total) by mouth daily.   aspirin EC 81 MG tablet Take 1 tablet (81 mg total) by mouth daily.   azelastine 0.1 % nasal spray Commonly known as: ASTELIN Place 1 spray into both nostrils 2 (two) times daily as needed for allergies.   buPROPion 150 MG 24 hr tablet Commonly known as: WELLBUTRIN XL Take 1 tablet (150 mg total) by mouth every morning.   carvedilol 6.25 MG tablet Commonly known as: COREG Take 1 tablet (6.25 mg total) by mouth 2 (two) times daily  with a meal.   diclofenac Sodium 1 % Gel Commonly known as: VOLTAREN Apply 2 g topically 4 (four) times daily as needed (pain).   doxycycline 100 MG tablet Commonly known as: VIBRA-TABS Take 1 tablet (100 mg total) by mouth 2 (two) times daily for 5 days.   empagliflozin 10 MG Tabs tablet Commonly known as: Jardiance Take 5 mg daily What changed:  how much to take how to take this when to take this additional instructions   gabapentin 400 MG capsule Commonly known as: NEURONTIN Take 400-800 mg by mouth See admin instructions. Take 1 capsule (400mg ) every morning and 2 capsules (800mg ) at bedtime.   lamoTRIgine 150 MG tablet Commonly known as: LAMICTAL Take 1 tablet (150 mg total) by mouth at bedtime.   levothyroxine 112 MCG tablet Commonly known as: SYNTHROID Take 112 mcg by mouth daily.   OVER THE COUNTER MEDICATION Apply 1 application  topically daily as needed (pain). CBD salve   pantoprazole 40 MG tablet Commonly known as: PROTONIX Take 1 tablet by mouth daily before supper. What changed: See the new instructions.   Repatha SureClick 140 MG/ML Soaj Generic drug: Evolocumab INJECT 1 DOSE UNDER THE SKIN EVERY 14 DAYS   rOPINIRole 3 MG tablet Commonly known as: REQUIP TAKE ONE TABLET BY MOUTH EVERY MORNING, ,NOON AND AT BEDTIME. (PRT PT MORNING,EVENING,BEDTIME) What changed:  how much to take how to  take this when to take this additional instructions   Toujeo SoloStar 300 UNIT/ML Solostar Pen Generic drug: insulin glargine (1 Unit Dial) Inject 50 Units into the skin at bedtime.   venlafaxine XR 150 MG 24 hr capsule Commonly known as: EFFEXOR-XR TAKE TWO (2) CAPSULES BY MOUTH AT BEDTIME   Vitamin D3 50 MCG (2000 UT) capsule Take 1 capsule (2,000 Units total) by mouth daily with lunch.               Discharge Care Instructions  (From admission, onward)           Start     Ordered   04/14/23 0000  Discharge wound care:       Comments:  [place] show admission wound care instructions and continue to follow-up with wound care service as previously Arranged   04/14/23 1431            Follow-up Information     [name], MD. Schedule an appointment as soon as possible for a visit in 10 day(s).   Specialty: Internal Medicine Contact information: [address] [place] [address] [phone]                Discharge Exam: Filed Weights   04/13/23 1844 04/13/23 2330 04/14/23 0421  Weight: 119.3 kg 121.4 kg 122.9 kg   General exam: Alert, awake, oriented x 3; morbidly obese in appearance and in no acute distress.  Afebrile. Respiratory system: Positive scattered rhonchi; no using accessory muscle.  Good saturation on room air. Cardiovascular system:RRR. No murmurs, rubs, gallops. Gastrointestinal system: Abdomen is obese, nondistended, soft and nontender. No organomegaly or masses felt. Normal bowel sounds heard. Central nervous system: No focal neurological deficits. Extremities/skin: No cyanosis or clubbing; left lower extremity with chronic malleolar infection demonstrating mild surrounding erythematous changes. Psychiatry: Judgement and insight appear normal. Mood & affect appropriate.    Condition at discharge: Stable and improved.  The results of significant diagnostics from this hospitalization (including imaging, microbiology, ancillary and laboratory) are listed below for reference.   Imaging Studies: DG Foot Complete Left Result Date: 04/13/2023 CLINICAL DATA:  Left foot swelling EXAM: LEFT FOOT - COMPLETE 3+ VIEW COMPARISON:  None Available. FINDINGS: Frontal, oblique, and lateral views of the left foot are obtained. There is severe swelling of the anterolateral aspect of the left ankle. No acute fracture, subluxation, or dislocation. Multifocal osteoarthritis greatest within the midfoot and tibiotalar joint. Prominent inferior calcaneal spur. IMPRESSION: 1. Multifocal osteoarthritis. 2.  Severe soft tissue swelling of the anterior and lateral aspect of the ankle. 3. No acute fracture. Electronically Signed   By: Sharlet Salina M.D.   On: 04/13/2023 19:56   DG Chest 1 View Result Date: 04/13/2023 CLINICAL DATA:  Sepsis, weakness EXAM: CHEST  1 VIEW COMPARISON:  09/25/2022 FINDINGS: Single frontal view of the chest demonstrates an unremarkable cardiac silhouette. No acute airspace disease, effusion, or pneumothorax. No acute bony abnormality. IMPRESSION: 1. No acute intrathoracic process. Electronically Signed   By: Sharlet Salina M.D.   On: 04/13/2023 19:54    Microbiology: Results for orders placed or performed during the hospital encounter of 04/13/23  Culture, blood (Routine x 2)     Status: None (Preliminary result)   Collection Time: 04/13/23  6:48 PM   Specimen: BLOOD  Result Value Ref Range Status   Specimen Description BLOOD  Final   Special Requests NONE  Final   Culture   Final    NO GROWTH <  12 HOURS Performed at [facility], [address], [place], [place] [address]    Report Status PENDING  Incomplete  Resp panel by RT-PCR (RSV, Flu A&B, Covid) Anterior Nasal Swab     Status: None   Collection Time: 04/13/23  7:16 PM   Specimen: Anterior Nasal Swab  Result Value Ref Range Status   SARS Coronavirus 2 by RT PCR NEGATIVE NEGATIVE Final    Comment: (NOTE) SARS-CoV-2 target nucleic acids are NOT DETECTED.  The SARS-CoV-2 RNA is generally detectable in upper respiratory specimens during the acute phase of infection. The lowest concentration of SARS-CoV-2 viral copies this assay can detect is 138 copies/mL. A negative result does not preclude SARS-Cov-2 infection and should not be used as the sole basis for treatment or other patient management decisions. A negative result may occur with  improper specimen collection/handling, submission of specimen other than nasopharyngeal swab, presence of viral mutation(s) within the areas targeted by this assay, and  inadequate number of viral copies(<138 copies/mL). A negative result must be combined with clinical observations, patient history, and epidemiological information. The expected result is Negative.  Fact Sheet for Patients:  BloggerCourse.com  Fact Sheet for Healthcare Providers:  SeriousBroker.it  This test is no t yet approved or cleared by the [place] FDA and  has been authorized for detection and/or diagnosis of SARS-CoV-2 by FDA under an Emergency Use Authorization (EUA). This EUA will remain  in effect (meaning this test can be used) for the duration of the COVID-19 declaration under Section 564(b)(1) of the Act, 21 U.S.C.section 360bbb-3(b)(1), unless the authorization is terminated  or revoked sooner.       Influenza A by PCR NEGATIVE NEGATIVE Final   Influenza B by PCR NEGATIVE NEGATIVE Final    Comment: (NOTE) The Xpert Xpress SARS-CoV-2/FLU/RSV plus assay is intended as an aid in the diagnosis of influenza from Nasopharyngeal swab specimens and should not be used as a sole basis for treatment. Nasal washings and aspirates are unacceptable for Xpert Xpress SARS-CoV-2/FLU/RSV testing.  Fact Sheet for Patients: BloggerCourse.com  Fact Sheet for Healthcare Providers: SeriousBroker.it  This test is not yet approved or cleared by the [place] FDA and has been authorized for detection and/or diagnosis of SARS-CoV-2 by FDA under an Emergency Use Authorization (EUA). This EUA will remain in effect (meaning this test can be used) for the duration of the COVID-19 declaration under Section 564(b)(1) of the Act, 21 U.S.C. section 360bbb-3(b)(1), unless the authorization is terminated or revoked.     Resp Syncytial Virus by PCR NEGATIVE NEGATIVE Final    Comment: (NOTE) Fact Sheet for Patients: BloggerCourse.com  Fact Sheet for Healthcare  Providers: SeriousBroker.it  This test is not yet approved or cleared by the [place] FDA and has been authorized for detection and/or diagnosis of SARS-CoV-2 by FDA under an Emergency Use Authorization (EUA). This EUA will remain in effect (meaning this test can be used) for the duration of the COVID-19 declaration under Section 564(b)(1) of the Act, 21 U.S.C. section 360bbb-3(b)(1), unless the authorization is terminated or revoked.  Performed at [facility], [address], [place], [place] [address]   Culture, blood (Routine x 2)     Status: None (Preliminary result)   Collection Time: 04/13/23  8:34 PM   Specimen: BLOOD LEFT HAND  Result Value Ref Range Status   Specimen Description BLOOD LEFT HAND  Final   Special Requests   Final    BOTTLES DRAWN AEROBIC AND ANAEROBIC Blood Culture  adequate volume   Culture   Final    NO GROWTH < 12 HOURS Performed at [facility], [address] [place], [place] [address]    Report Status PENDING  Incomplete   *Note: Due to a large number of results and/or encounters for the requested time period, some results have not been displayed. A complete set of results can be found in Results Review.    Labs: CBC: Recent Labs  Lab 04/10/23 0846 04/13/23 1846 04/14/23 0449  WBC 9.7 15.8* 11.5*  NEUTROABS 7.0  --   --   HGB 13.2 14.2 13.1  HCT 40.3 41.9 39.7  MCV 97.1 92.9 94.7  PLT 257 251 215   Basic Metabolic Panel: Recent Labs  Lab 04/10/23 0846 04/13/23 1846 04/14/23 0449  NA 137 133* 134*  K 4.1 4.3 3.8  CL 103 98 102  CO2 25 23 22   GLUCOSE 186* 190* 186*  BUN 25* 22 22  CREATININE 1.28* 1.36* 1.36*  CALCIUM 8.5* 9.3 8.5*   Liver Function Tests: Recent Labs  Lab 04/13/23 1846  AST 18  ALT 12  ALKPHOS 65  BILITOT 0.7  PROT 8.2*  ALBUMIN 3.6   CBG: Recent Labs  Lab 04/13/23 1925 04/13/23 2249 04/14/23 0045 04/14/23 0734 04/14/23 1115  GLUCAP 198* 179* 156* 164* 121*     Discharge time spent: greater than 30 minutes.  Signed: [name], MD [facility] 04/14/2023

## 2023-04-14 NOTE — Plan of Care (Signed)
  Problem: Acute Rehab PT Goals(only PT should resolve) Goal: Pt Will Go Supine/Side To Sit Outcome: Progressing Flowsheets (Taken 04/14/2023 1426) Pt will go Supine/Side to Sit:  Independently  with modified independence Goal: Patient Will Transfer Sit To/From Stand Outcome: Progressing Flowsheets (Taken 04/14/2023 1426) Patient will transfer sit to/from stand:  with modified independence  with supervision Goal: Pt Will Transfer Bed To Chair/Chair To Bed Outcome: Progressing Flowsheets (Taken 04/14/2023 1426) Pt will Transfer Bed to Chair/Chair to Bed:  with modified independence  with supervision Goal: Pt Will Ambulate Outcome: Progressing Flowsheets (Taken 04/14/2023 1426) Pt will Ambulate:  50 feet  with supervision  with modified independence  with rolling walker   2:27 PM, 04/14/23 Norma [name], MPT Physical Therapist with [facility] [phone] office [phone] mobile phone

## 2023-04-14 NOTE — Care Management Obs Status (Signed)
MEDICARE OBSERVATION STATUS NOTIFICATION   Patient Details  Name: Norma [name] MRN: [id number] Date of Birth: [birth year]   Medicare Observation Status Notification Given:  Yes    Villa Herb, LCSWA 04/14/2023, 11:44 AM

## 2023-04-14 NOTE — TOC Initial Note (Signed)
Transition of Care [facility]) - Initial/Assessment Note    Patient Details  Name: Norma [name] MRN: [id number] Date of Birth: Jun 19, 1950  [facility]) CM/SW Contact:    Villa Herb, LCSWA Phone Number: 04/14/2023, 11:46 AM  Clinical Narrative:                 CSW updated that PT is reocmmedning [facility] PT for pt at D/C. CSW spoke with pt to complete assessment. Pt states that she lives with a roommate. Pt is mostly independent in completing her ADLs. Pt states that she has help from her sister if needed. Pts sister provides transportation if needed. Pt states that she has HH with [facility]. CSW spoke to [place] with [facility] who states they are active for PT/RN, will need MD to place ned HH orders. Pt states that she has needed DME to use if and when needed. TOC to follow.   Expected Discharge Plan: Home w Home Health Services Barriers to Discharge: Continued Medical Work up   Patient Goals and CMS Choice Patient states their goals for this hospitalization and ongoing recovery are:: return homr CMS Medicare.gov Compare Post Acute Care list provided to:: Patient Choice offered to / list presented to : Patient      Expected Discharge Plan and Services In-house Referral: Clinical Social Work Discharge Planning Services: CM Consult Post Acute Care Choice: Home Health Living arrangements for the past 2 months: Single Family Home                                      Prior Living Arrangements/Services Living arrangements for the past 2 months: Single Family Home Lives with:: Roommate Patient language and need for interpreter reviewed:: Yes Do you feel safe going back to the place where you live?: Yes      Need for Family Participation in Patient Care: Yes (Comment) Care giver support system in place?: Yes (comment) Current home services: DME, Home PT, Home RN Criminal Activity/Legal Involvement Pertinent to Current Situation/Hospitalization: No - Comment as  needed  Activities of Daily Living   ADL Screening (condition at time of admission) Independently performs ADLs?: Yes (appropriate for developmental age) Is the patient deaf or have difficulty hearing?: No Does the patient have difficulty seeing, even when wearing glasses/contacts?: No Does the patient have difficulty concentrating, remembering, or making decisions?: No  Permission Sought/Granted                  Emotional Assessment Appearance:: Appears stated age Attitude/Demeanor/Rapport: Engaged Affect (typically observed): Accepting Orientation: : Oriented to Self, Oriented to Place, Oriented to  Time, Oriented to Situation Alcohol / Substance Use: Not Applicable Psych Involvement: No (comment)  Admission diagnosis:  Sepsis (HCC) [A41.9] Patient Active Problem List   Diagnosis Date Noted   Sepsis (HCC) 04/13/2023   SIRS (systemic inflammatory response syndrome) (HCC) 04/13/2023   Insulin long-term use (HCC) 02/04/2023   Vitamin D deficiency 02/04/2023   Arthritis 12/18/2022   COPD (chronic obstructive pulmonary disease) (HCC) 12/18/2022   Hypertension 12/18/2022   Diabetic ulcer of toe of left foot associated with type 2 diabetes mellitus, limited to breakdown of skin (HCC) 11/13/2022   DOE (dyspnea on exertion) 09/10/2022   Osteopenia 06/13/2022   Iron deficiency 12/25/2021   Abdominal pain, epigastric 11/12/2021   History of back surgery 07/13/2021   Dysphagia 05/22/2021   Leukocytosis 04/20/2021   Spinal stenosis  of lumbar region 02/12/2021   Stage 3b chronic kidney disease (HCC) 11/29/2020   History of diabetes mellitus 11/29/2020   Chronic diastolic heart failure (HCC) 11/29/2020   Idiopathic urticaria 11/29/2020   IDA (iron deficiency anemia) 11/29/2020   Systolic heart failure (HCC) 11/14/2020   Disorder of kidney 11/14/2020   Myeloproliferative disorder (HCC) 05/22/2020   Thrombocytosis 05/22/2020   Body mass index (BMI) 45.0-49.9, adult (HCC)  05/17/2020   S/P ORIF (open reduction internal fixation) fracture left index finger 04/10/20 04/26/2020   Open displaced fracture of proximal phalanx of left index finger    Cellulitis of left hand    Dog bite, hand, left, initial encounter 04/09/2020   Dog bite 04/09/2020   Hypoxemia 09/23/2019   Sleep related hypoxia 09/23/2019   Idiopathic hypersomnia with long sleep time 08/16/2019   Insomnia due to other mental disorder 08/16/2019   Dry mouth not due to sicca syndrome 08/16/2019   Chronic coughing 08/16/2019   Unilateral primary osteoarthritis, right knee 02/18/2019   Bilateral primary osteoarthritis of knee 01/20/2019   Type 2 diabetes mellitus with diabetic neuropathy, unspecified (HCC) 09/16/2018   Unilateral primary osteoarthritis, left knee 09/16/2018   Class 3 severe obesity due to excess calories with serious comorbidity and body mass index (BMI) of 45.0 to 49.9 in adult (HCC) 02/20/2018   Severe obesity (HCC) 02/20/2018   Mixed hyperlipidemia 09/11/2017   Pressure injury of skin 09/02/2017   Coronary artery disease involving native heart without angina pectoris    S/P CABG x 3    Acute blood loss anemia    Bipolar affective disorder (HCC)    History of CVA (cerebrovascular accident)    Hx of CABG 08/29/2017   Coronary artery disease involving native coronary artery of native heart with unstable angina pectoris (HCC) 08/28/2017   Unstable angina (HCC) 08/27/2017   Cardiomyopathy (HCC) 08/22/2017   Cerebrovascular accident (HCC) 05/16/2017   Anemia 09/19/2016   Dizziness 06/21/2016   Chest pain with moderate risk for cardiac etiology 04/23/2016   Bipolar I disorder, most recent episode depressed (HCC) 06/05/2015   DM type 2 causing vascular disease (HCC) 09/20/2014   Chronic bilateral low back pain with bilateral sciatica 09/20/2014   Morbid obesity (HCC) 09/20/2014   Memory difficulty 09/20/2014   Type 2 diabetes mellitus (HCC) 09/20/2014   Chronic low back pain  09/20/2014   Lumbosacral spondylosis without myelopathy 12/25/2012   Restless legs syndrome (RLS) 09/17/2012   Headache 03/19/2012   Gastroesophageal reflux disease 02/16/2012   Hypothyroidism 02/16/2012   Tremor 02/16/2012   Hyperlipidemia associated with type 2 diabetes mellitus (HCC) 04/01/2008   Obstructive sleep apnea syndrome 04/01/2008   Benign essential hypertension 04/01/2008   Left bundle-branch block 04/01/2008   PCP:  [name], MD Pharmacy:   [facility] - [place], [place] - [address] [place] [address] Phone: [phone] Fax: [phone]  [facility] [id number] - [place], [place] - [address] AT [facility] OF [address] [place] [address] Phone: [phone] Fax: [phone]  [facility] - [place], [place] - [address] [place] [address] Phone: [phone] Fax: [phone]     Social Drivers of Health (SDOH) Social History: SDOH Screenings   Food Insecurity: No Food Insecurity (04/13/2023)  Housing: Low Risk  (04/13/2023)  Transportation Needs: No Transportation Needs (04/13/2023)  Utilities: Not At Risk (04/13/2023)  Depression (PHQ2-9): Low Risk  (  08/13/2022)  Financial Resource Strain: Medium Risk (12/18/2021)  Stress: No Stress Concern Present (10/04/2021)  Tobacco Use: Medium Risk (04/13/2023)   SDOH Interventions:     Readmission Risk Interventions     No data to display

## 2023-04-14 NOTE — Evaluation (Addendum)
Physical Therapy Evaluation Patient Details Name: Norma [name] MRN: [id number] DOB: July 10, 1950 Today's Date: 04/14/2023  History of Present Illness  Norma [name] is a 72 y.o. female with medical history significant of bipolar disorder, IDA, CKD 3, COPD on 2 L at night, CAD s/p CABG, anxiety and depression, T2DM, diabetic neuropathy, HLD, fatty liver, gastroparesis, GERD, hypothyroidism, IBS, morbid obesity, CVA, restless leg syndrome and chronic left foot wound who presents to the ED for evaluation of generalized weakness. Reports that she was around family members who had cold and cough symptoms around Christmas.  Over the last few days, she has had generalized weakness. Her roommate who often cooks for her was not around yesterday so patient did not get up out of bed all day. She reports being extremely fatigued and experience some occasional chills. She denies any cough, shortness of breath, chest pain, fever, abdominal pain, nausea, vomiting, dizziness, dysuria, hematuria, headache or foot pain.  Reports she is on 2 L [place] with her CPAP at night.   Clinical Impression  Patient demonstrates good return for sitting up at bedside, transferring to/from [facility], chair and commode in bathroom, unsteady on feet, but able to ambulate using RW without loss of balance and limited mostly due to c/o SOB. Patient tolerated sitting up in chair after therapy - nursing staff notified. Patient on room air during functional activities with SpO2 at 96%. Patient will benefit from continued skilled physical therapy in hospital and recommended venue below to increase strength, balance, endurance for safe ADLs and gait.        If plan is discharge home, recommend the following: A little help with walking and/or transfers;A little help with bathing/dressing/bathroom;Help with stairs or ramp for entrance;Assistance with cooking/housework   Can travel by private vehicle        Equipment Recommendations None  recommended by PT  Recommendations for Other Services       Functional Status Assessment Patient has had a recent decline in their functional status and demonstrates the ability to make significant improvements in function in a reasonable and predictable amount of time.     Precautions / Restrictions Precautions Precautions: Fall Restrictions Weight Bearing Restrictions Per Provider Order: No      Mobility  Bed Mobility Overal bed mobility: Modified Independent                  Transfers Overall transfer level: Modified independent                 General transfer comment: slightly labored movement    Ambulation/Gait Ambulation/Gait assistance: Supervision, Contact guard assist Gait Distance (Feet): 15 Feet Assistive device: Rolling walker (2 wheels), None Gait Pattern/deviations: Decreased stride length, Decreased step length - right, Decreased step length - left Gait velocity: decreasd     General Gait Details: slow labored movement without loss of balance using RW and when using IV pole  Stairs            Wheelchair Mobility     Tilt Bed    Modified Rankin (Stroke Patients Only)       Balance Overall balance assessment: Needs assistance Sitting-balance support: Feet supported, No upper extremity supported Sitting balance-Leahy Scale: Good Sitting balance - Comments: seated at EOB   Standing balance support: During functional activity, No upper extremity supported Standing balance-Leahy Scale: Fair Standing balance comment: fair/good using RW  Pertinent Vitals/Pain Pain Assessment Pain Assessment: 0-10 Pain Score: 3  Pain Location: headache Pain Descriptors / Indicators: Headache Pain Intervention(s): Limited activity within patient's tolerance, Monitored during session    Home Living Family/patient expects to be discharged to:: Private residence Living Arrangements:  Non-relatives/Friends Available Help at Discharge: Family;Friend(s);Available PRN/intermittently Type of Home: House Home Access: Stairs to enter Entrance Stairs-Rails: Doctor, general practice of Steps: multiple short (handicap height steps)   Home Layout: Two level Home Equipment: Agricultural consultant (2 wheels);Cane - single point;BSC/3in1;Wheelchair - manual      Prior Function Prior Level of Function : Independent/Modified Independent             Mobility Comments: Household ambulation without AD ADLs Comments: Independent     Extremity/Trunk Assessment   Upper Extremity Assessment Upper Extremity Assessment: Overall WFL for tasks assessed    Lower Extremity Assessment Lower Extremity Assessment: Generalized weakness    Cervical / Trunk Assessment Cervical / Trunk Assessment: Normal  Communication   Communication Communication: No apparent difficulties  Cognition Arousal: Alert Behavior During Therapy: WFL for tasks assessed/performed Overall Cognitive Status: Within Functional Limits for tasks assessed                                          General Comments      Exercises     Assessment/Plan    PT Assessment Patient needs continued PT services  PT Problem List Decreased strength;Decreased activity tolerance;Decreased balance;Decreased mobility       PT Treatment Interventions DME instruction;Gait training;Stair training;Functional mobility training;Therapeutic activities;Therapeutic exercise;Balance training;Patient/family education    PT Goals (Current goals can be found in the Care Plan section)  Acute Rehab PT Goals Patient Stated Goal: return home with family/friends to assist PT Goal Formulation: With patient Time For Goal Achievement: 04/18/23 Potential to Achieve Goals: Good    Frequency Min 3X/week     Co-evaluation               AM-PAC PT "6 Clicks" Mobility  Outcome Measure Help needed turning from  your back to your side while in a flat bed without using bedrails?: None Help needed moving from lying on your back to sitting on the side of a flat bed without using bedrails?: None Help needed moving to and from a bed to a chair (including a wheelchair)?: A Little Help needed standing up from a chair using your arms (e.g., wheelchair or bedside chair)?: None Help needed to walk in hospital room?: A Little Help needed climbing 3-5 steps with a railing? : A Little 6 Click Score: 21    End of Session   Activity Tolerance: Patient tolerated treatment well;Patient limited by fatigue Patient left: in chair;with call bell/phone within reach Nurse Communication: Mobility status PT Visit Diagnosis: Unsteadiness on feet (R26.81);Other abnormalities of gait and mobility (R26.89);Muscle weakness (generalized) (M62.81)    Time: 1038-1100 PT Time Calculation (min) (ACUTE ONLY): 22 min   Charges:   PT Evaluation $PT Eval Moderate Complexity: 1 Mod PT Treatments $Therapeutic Activity: 8-22 mins PT General Charges $$ ACUTE PT VISIT: 1 Visit         2:39 PM, 04/14/23 Ocie Bob, MPT Physical Therapist with [facility] [phone] office [phone] mobile phone

## 2023-04-14 NOTE — Progress Notes (Signed)
Patient arrived to the unit, room 338. Patient alert and oriented x4. Admission done. Vitals obtained, stable. DNR bracelet applied. Patient stated she was hungry- bag lunch and cup of water given. Respiratory called for CPAP.

## 2023-04-14 NOTE — Plan of Care (Signed)
  Problem: Clinical Measurements: Goal: Will remain free from infection Outcome: Progressing Goal: Diagnostic test results will improve Outcome: Progressing Goal: Respiratory complications will improve Outcome: Progressing   Problem: Skin Integrity: Goal: Risk for impaired skin integrity will decrease Outcome: Progressing

## 2023-04-14 NOTE — Care Management CC44 (Signed)
Condition Code 44 Documentation Completed  Patient Details  Name: Norma [name] MRN: [id number] Date of Birth: [birth year]   Condition Code 44 given:  Yes Patient signature on Condition Code 44 notice:  Yes Documentation of 2 MD's agreement:  Yes Code 44 added to claim:  Yes    Villa Herb, LCSWA 04/14/2023, 3:06 PM

## 2023-04-14 NOTE — Plan of Care (Signed)
  Problem: Education: Goal: Ability to describe self-care measures that may prevent or decrease complications (Diabetes Survival Skills Education) will improve Outcome: Adequate for Discharge Goal: Individualized Educational Video(s) Outcome: Adequate for Discharge   Problem: Coping: Goal: Ability to adjust to condition or change in health will improve Outcome: Adequate for Discharge   Problem: Fluid Volume: Goal: Ability to maintain a balanced intake and output will improve Outcome: Adequate for Discharge   Problem: Health Behavior/Discharge Planning: Goal: Ability to identify and utilize available resources and services will improve Outcome: Adequate for Discharge Goal: Ability to manage health-related needs will improve Outcome: Adequate for Discharge   Problem: Metabolic: Goal: Ability to maintain appropriate glucose levels will improve Outcome: Adequate for Discharge   Problem: Nutritional: Goal: Maintenance of adequate nutrition will improve Outcome: Adequate for Discharge Goal: Progress toward achieving an optimal weight will improve Outcome: Adequate for Discharge   Problem: Skin Integrity: Goal: Risk for impaired skin integrity will decrease Outcome: Adequate for Discharge   Problem: Tissue Perfusion: Goal: Adequacy of tissue perfusion will improve Outcome: Adequate for Discharge   Problem: Education: Goal: Knowledge of General Education information will improve Description: Including pain rating scale, medication(s)/side effects and non-pharmacologic comfort measures Outcome: Adequate for Discharge   Problem: Health Behavior/Discharge Planning: Goal: Ability to manage health-related needs will improve Outcome: Adequate for Discharge   Problem: Clinical Measurements: Goal: Ability to maintain clinical measurements within normal limits will improve Outcome: Adequate for Discharge Goal: Will remain free from infection Outcome: Adequate for Discharge Goal:  Diagnostic test results will improve Outcome: Adequate for Discharge Goal: Respiratory complications will improve Outcome: Adequate for Discharge Goal: Cardiovascular complication will be avoided Outcome: Adequate for Discharge   Problem: Activity: Goal: Risk for activity intolerance will decrease Outcome: Adequate for Discharge   Problem: Nutrition: Goal: Adequate nutrition will be maintained Outcome: Adequate for Discharge   Problem: Coping: Goal: Level of anxiety will decrease Outcome: Adequate for Discharge   Problem: Elimination: Goal: Will not experience complications related to bowel motility Outcome: Adequate for Discharge Goal: Will not experience complications related to urinary retention Outcome: Adequate for Discharge   Problem: Pain Management: Goal: General experience of comfort will improve Outcome: Adequate for Discharge   Problem: Safety: Goal: Ability to remain free from injury will improve Outcome: Adequate for Discharge   Problem: Skin Integrity: Goal: Risk for impaired skin integrity will decrease Outcome: Adequate for Discharge   Problem: Acute Rehab PT Goals(only PT should resolve) Goal: Pt Will Go Supine/Side To Sit Outcome: Adequate for Discharge Goal: Patient Will Transfer Sit To/From Stand Outcome: Adequate for Discharge Goal: Pt Will Transfer Bed To Chair/Chair To Bed Outcome: Adequate for Discharge Goal: Pt Will Ambulate Outcome: Adequate for Discharge

## 2023-04-15 ENCOUNTER — Encounter (INDEPENDENT_AMBULATORY_CARE_PROVIDER_SITE_OTHER): Payer: Self-pay | Admitting: *Deleted

## 2023-04-15 DIAGNOSIS — G609 Hereditary and idiopathic neuropathy, unspecified: Secondary | ICD-10-CM | POA: Diagnosis not present

## 2023-04-15 DIAGNOSIS — M5417 Radiculopathy, lumbosacral region: Secondary | ICD-10-CM | POA: Diagnosis not present

## 2023-04-17 ENCOUNTER — Telehealth: Payer: Self-pay | Admitting: Cardiology

## 2023-04-17 NOTE — Telephone Encounter (Signed)
 Patient notified and verbalized understanding. Patient will continue to monitor bp.

## 2023-04-17 NOTE — Telephone Encounter (Signed)
 Pt c/o medication issue:  1. Name of Medication: Lopressor   2. How are you currently taking this medication (dosage and times per day)? As Written 3. Are you having a reaction (difficulty breathing--STAT)? No  4. What is your medication issue? Patient was at the hospital on Sunday and was informed to stop taking this medication. Patient would like to know if Dr. Alvan would like her to stop the medication. Please advise.

## 2023-04-17 NOTE — Telephone Encounter (Signed)
 Per hospital note:  3-hypertension -Continue home antihypertensive agents with exception for losartan  due to soft blood pressure at time of discharge. -Patient advised to maintain adequate hydration and to follow heart healthy/low-sodium diet -PCP to reassess blood pressure and adjust medication as required.  Pt stated that at home on Losartan  bp is running in the 120-140 range. Pt is questioning if she should restart Losartan . Please advise.

## 2023-04-18 DIAGNOSIS — E11621 Type 2 diabetes mellitus with foot ulcer: Secondary | ICD-10-CM | POA: Diagnosis not present

## 2023-04-18 DIAGNOSIS — Z794 Long term (current) use of insulin: Secondary | ICD-10-CM | POA: Diagnosis not present

## 2023-04-18 DIAGNOSIS — I509 Heart failure, unspecified: Secondary | ICD-10-CM | POA: Diagnosis not present

## 2023-04-18 DIAGNOSIS — L97521 Non-pressure chronic ulcer of other part of left foot limited to breakdown of skin: Secondary | ICD-10-CM | POA: Diagnosis not present

## 2023-04-18 DIAGNOSIS — I110 Hypertensive heart disease with heart failure: Secondary | ICD-10-CM | POA: Diagnosis not present

## 2023-04-18 DIAGNOSIS — J449 Chronic obstructive pulmonary disease, unspecified: Secondary | ICD-10-CM | POA: Diagnosis not present

## 2023-04-18 LAB — CULTURE, BLOOD (ROUTINE X 2)
Culture: NO GROWTH
Culture: NO GROWTH
Special Requests: ADEQUATE

## 2023-04-22 ENCOUNTER — Telehealth: Payer: Self-pay

## 2023-04-22 DIAGNOSIS — E039 Hypothyroidism, unspecified: Secondary | ICD-10-CM | POA: Diagnosis not present

## 2023-04-22 DIAGNOSIS — L84 Corns and callosities: Secondary | ICD-10-CM | POA: Diagnosis not present

## 2023-04-22 DIAGNOSIS — E1121 Type 2 diabetes mellitus with diabetic nephropathy: Secondary | ICD-10-CM | POA: Diagnosis not present

## 2023-04-22 DIAGNOSIS — B351 Tinea unguium: Secondary | ICD-10-CM | POA: Diagnosis not present

## 2023-04-22 DIAGNOSIS — E1142 Type 2 diabetes mellitus with diabetic polyneuropathy: Secondary | ICD-10-CM | POA: Diagnosis not present

## 2023-04-22 DIAGNOSIS — Z7689 Persons encountering health services in other specified circumstances: Secondary | ICD-10-CM | POA: Diagnosis not present

## 2023-04-22 DIAGNOSIS — M79676 Pain in unspecified toe(s): Secondary | ICD-10-CM | POA: Diagnosis not present

## 2023-04-22 DIAGNOSIS — I10 Essential (primary) hypertension: Secondary | ICD-10-CM | POA: Diagnosis not present

## 2023-04-22 DIAGNOSIS — D509 Iron deficiency anemia, unspecified: Secondary | ICD-10-CM | POA: Diagnosis not present

## 2023-04-22 DIAGNOSIS — E1329 Other specified diabetes mellitus with other diabetic kidney complication: Secondary | ICD-10-CM | POA: Diagnosis not present

## 2023-04-22 DIAGNOSIS — M545 Low back pain, unspecified: Secondary | ICD-10-CM | POA: Diagnosis not present

## 2023-04-22 DIAGNOSIS — I2510 Atherosclerotic heart disease of native coronary artery without angina pectoris: Secondary | ICD-10-CM | POA: Diagnosis not present

## 2023-04-22 DIAGNOSIS — G8929 Other chronic pain: Secondary | ICD-10-CM | POA: Diagnosis not present

## 2023-04-22 DIAGNOSIS — E1165 Type 2 diabetes mellitus with hyperglycemia: Secondary | ICD-10-CM | POA: Diagnosis not present

## 2023-04-22 DIAGNOSIS — L97509 Non-pressure chronic ulcer of other part of unspecified foot with unspecified severity: Secondary | ICD-10-CM | POA: Diagnosis not present

## 2023-04-22 DIAGNOSIS — G2581 Restless legs syndrome: Secondary | ICD-10-CM | POA: Diagnosis not present

## 2023-04-22 LAB — COMPREHENSIVE METABOLIC PANEL
Albumin: 3.8 (ref 3.5–5.0)
Calcium: 9.4 (ref 8.7–10.7)
Globulin: 2.8

## 2023-04-22 LAB — LIPID PANEL
Cholesterol: 173 (ref 0–200)
HDL: 41 (ref 35–70)
LDL Cholesterol: 106
Triglycerides: 147 (ref 40–160)

## 2023-04-22 LAB — BASIC METABOLIC PANEL
BUN: 24 — AB (ref 4–21)
CO2: 26 — AB (ref 13–22)
Chloride: 99 (ref 99–108)
Creatinine: 1.5 — AB (ref 0.5–1.1)
Glucose: 203
Potassium: 5 meq/L (ref 3.5–5.1)
Sodium: 138 (ref 137–147)

## 2023-04-22 LAB — HEPATIC FUNCTION PANEL
ALT: 12 U/L (ref 7–35)
AST: 11 — AB (ref 13–35)
Alkaline Phosphatase: 81 (ref 25–125)
Bilirubin, Total: 0.3

## 2023-04-22 LAB — TSH: TSH: 1.79 (ref 0.41–5.90)

## 2023-04-22 LAB — HEMOGLOBIN A1C: Hemoglobin A1C: 7.7

## 2023-04-22 NOTE — Telephone Encounter (Signed)
 Pt called stating she was in the hospital on 12/29 through 12/30 due to fever and altered mental status. States they thought she may be septic but did not find any infection. States they advised her to stop taking glipizide  but she was not comfortable doing that so she has continued taking glipizide  5mg  daily, she is also taking Jardiance  10mg  daily and Toujeo  50 units at bedtime. Pt states her BG this morning was 121 and yesterday before bed was 111. States her glucose averages between 170-180 usually but was unable to give me specific readings otherwise. Please advise.

## 2023-04-23 DIAGNOSIS — L97521 Non-pressure chronic ulcer of other part of left foot limited to breakdown of skin: Secondary | ICD-10-CM | POA: Diagnosis not present

## 2023-04-23 DIAGNOSIS — J449 Chronic obstructive pulmonary disease, unspecified: Secondary | ICD-10-CM | POA: Diagnosis not present

## 2023-04-23 DIAGNOSIS — E11621 Type 2 diabetes mellitus with foot ulcer: Secondary | ICD-10-CM | POA: Diagnosis not present

## 2023-04-23 DIAGNOSIS — I110 Hypertensive heart disease with heart failure: Secondary | ICD-10-CM | POA: Diagnosis not present

## 2023-04-23 DIAGNOSIS — G8929 Other chronic pain: Secondary | ICD-10-CM | POA: Diagnosis not present

## 2023-04-23 DIAGNOSIS — Z794 Long term (current) use of insulin: Secondary | ICD-10-CM | POA: Diagnosis not present

## 2023-04-23 DIAGNOSIS — I509 Heart failure, unspecified: Secondary | ICD-10-CM | POA: Diagnosis not present

## 2023-04-23 DIAGNOSIS — Z6841 Body Mass Index (BMI) 40.0 and over, adult: Secondary | ICD-10-CM | POA: Diagnosis not present

## 2023-04-23 LAB — LAB REPORT - SCANNED
A1c: 7.7
Albumin, Urine POC: 3.6
Albumin/Creatinine Ratio, Urine, POC: 3
Creatinine, POC: 116.2 mg/dL
EGFR: 38
Free T4: 1.37 ng/dL
TSH: 1.79 (ref 0.41–5.90)

## 2023-04-23 NOTE — Telephone Encounter (Signed)
Tried to call pt but did not receive an answer and was unable to leave a message due to her voicemail being full. 

## 2023-04-24 NOTE — Telephone Encounter (Signed)
 Spoke with pt advising her to discontinue glipizide and to contact the office if her FBG is >150 x 3 days per Dr.Nida's orders. Pt voiced understanding.

## 2023-04-25 DIAGNOSIS — I509 Heart failure, unspecified: Secondary | ICD-10-CM | POA: Diagnosis not present

## 2023-04-25 DIAGNOSIS — I110 Hypertensive heart disease with heart failure: Secondary | ICD-10-CM | POA: Diagnosis not present

## 2023-04-25 DIAGNOSIS — I5042 Chronic combined systolic (congestive) and diastolic (congestive) heart failure: Secondary | ICD-10-CM | POA: Diagnosis not present

## 2023-04-25 DIAGNOSIS — R809 Proteinuria, unspecified: Secondary | ICD-10-CM | POA: Diagnosis not present

## 2023-04-25 DIAGNOSIS — J449 Chronic obstructive pulmonary disease, unspecified: Secondary | ICD-10-CM | POA: Diagnosis not present

## 2023-04-25 DIAGNOSIS — E11621 Type 2 diabetes mellitus with foot ulcer: Secondary | ICD-10-CM | POA: Diagnosis not present

## 2023-04-25 DIAGNOSIS — L97521 Non-pressure chronic ulcer of other part of left foot limited to breakdown of skin: Secondary | ICD-10-CM | POA: Diagnosis not present

## 2023-04-25 DIAGNOSIS — Z794 Long term (current) use of insulin: Secondary | ICD-10-CM | POA: Diagnosis not present

## 2023-04-25 DIAGNOSIS — E1129 Type 2 diabetes mellitus with other diabetic kidney complication: Secondary | ICD-10-CM | POA: Diagnosis not present

## 2023-04-25 DIAGNOSIS — E1122 Type 2 diabetes mellitus with diabetic chronic kidney disease: Secondary | ICD-10-CM | POA: Diagnosis not present

## 2023-04-27 DIAGNOSIS — E118 Type 2 diabetes mellitus with unspecified complications: Secondary | ICD-10-CM | POA: Insufficient documentation

## 2023-04-27 DIAGNOSIS — L97509 Non-pressure chronic ulcer of other part of unspecified foot with unspecified severity: Secondary | ICD-10-CM | POA: Insufficient documentation

## 2023-04-28 DIAGNOSIS — I509 Heart failure, unspecified: Secondary | ICD-10-CM | POA: Diagnosis not present

## 2023-04-28 DIAGNOSIS — I110 Hypertensive heart disease with heart failure: Secondary | ICD-10-CM | POA: Diagnosis not present

## 2023-04-28 DIAGNOSIS — L97521 Non-pressure chronic ulcer of other part of left foot limited to breakdown of skin: Secondary | ICD-10-CM | POA: Diagnosis not present

## 2023-04-28 DIAGNOSIS — E11621 Type 2 diabetes mellitus with foot ulcer: Secondary | ICD-10-CM | POA: Diagnosis not present

## 2023-04-28 DIAGNOSIS — J449 Chronic obstructive pulmonary disease, unspecified: Secondary | ICD-10-CM | POA: Diagnosis not present

## 2023-04-28 DIAGNOSIS — Z794 Long term (current) use of insulin: Secondary | ICD-10-CM | POA: Diagnosis not present

## 2023-04-30 ENCOUNTER — Telehealth: Payer: Self-pay | Admitting: Pulmonary Disease

## 2023-04-30 DIAGNOSIS — E11621 Type 2 diabetes mellitus with foot ulcer: Secondary | ICD-10-CM | POA: Diagnosis not present

## 2023-04-30 DIAGNOSIS — L97521 Non-pressure chronic ulcer of other part of left foot limited to breakdown of skin: Secondary | ICD-10-CM | POA: Diagnosis not present

## 2023-04-30 DIAGNOSIS — G4733 Obstructive sleep apnea (adult) (pediatric): Secondary | ICD-10-CM

## 2023-04-30 DIAGNOSIS — Z87891 Personal history of nicotine dependence: Secondary | ICD-10-CM | POA: Diagnosis not present

## 2023-04-30 DIAGNOSIS — E1140 Type 2 diabetes mellitus with diabetic neuropathy, unspecified: Secondary | ICD-10-CM | POA: Diagnosis not present

## 2023-04-30 DIAGNOSIS — Z880 Allergy status to penicillin: Secondary | ICD-10-CM | POA: Diagnosis not present

## 2023-04-30 DIAGNOSIS — I10 Essential (primary) hypertension: Secondary | ICD-10-CM | POA: Diagnosis not present

## 2023-04-30 DIAGNOSIS — J449 Chronic obstructive pulmonary disease, unspecified: Secondary | ICD-10-CM | POA: Diagnosis not present

## 2023-04-30 DIAGNOSIS — Z7982 Long term (current) use of aspirin: Secondary | ICD-10-CM | POA: Diagnosis not present

## 2023-04-30 NOTE — Telephone Encounter (Signed)
 Patient needs new order sent to her DME [facility]) for her CPAP machine--Patient call back [phone]

## 2023-04-30 NOTE — Telephone Encounter (Signed)
 Order placed

## 2023-05-01 DIAGNOSIS — M545 Low back pain, unspecified: Secondary | ICD-10-CM | POA: Diagnosis not present

## 2023-05-01 DIAGNOSIS — E1329 Other specified diabetes mellitus with other diabetic kidney complication: Secondary | ICD-10-CM | POA: Diagnosis not present

## 2023-05-01 DIAGNOSIS — E039 Hypothyroidism, unspecified: Secondary | ICD-10-CM | POA: Diagnosis not present

## 2023-05-01 DIAGNOSIS — G2581 Restless legs syndrome: Secondary | ICD-10-CM | POA: Diagnosis not present

## 2023-05-01 DIAGNOSIS — Z23 Encounter for immunization: Secondary | ICD-10-CM | POA: Diagnosis not present

## 2023-05-01 DIAGNOSIS — D72829 Elevated white blood cell count, unspecified: Secondary | ICD-10-CM | POA: Diagnosis not present

## 2023-05-01 DIAGNOSIS — L97509 Non-pressure chronic ulcer of other part of unspecified foot with unspecified severity: Secondary | ICD-10-CM | POA: Diagnosis not present

## 2023-05-01 DIAGNOSIS — E1121 Type 2 diabetes mellitus with diabetic nephropathy: Secondary | ICD-10-CM | POA: Diagnosis not present

## 2023-05-01 DIAGNOSIS — I10 Essential (primary) hypertension: Secondary | ICD-10-CM | POA: Diagnosis not present

## 2023-05-01 DIAGNOSIS — R2689 Other abnormalities of gait and mobility: Secondary | ICD-10-CM | POA: Diagnosis not present

## 2023-05-01 DIAGNOSIS — Z7689 Persons encountering health services in other specified circumstances: Secondary | ICD-10-CM | POA: Diagnosis not present

## 2023-05-01 DIAGNOSIS — E1122 Type 2 diabetes mellitus with diabetic chronic kidney disease: Secondary | ICD-10-CM | POA: Diagnosis not present

## 2023-05-01 DIAGNOSIS — I130 Hypertensive heart and chronic kidney disease with heart failure and stage 1 through stage 4 chronic kidney disease, or unspecified chronic kidney disease: Secondary | ICD-10-CM | POA: Diagnosis not present

## 2023-05-02 DIAGNOSIS — Z794 Long term (current) use of insulin: Secondary | ICD-10-CM | POA: Diagnosis not present

## 2023-05-02 DIAGNOSIS — L97521 Non-pressure chronic ulcer of other part of left foot limited to breakdown of skin: Secondary | ICD-10-CM | POA: Diagnosis not present

## 2023-05-02 DIAGNOSIS — E11621 Type 2 diabetes mellitus with foot ulcer: Secondary | ICD-10-CM | POA: Diagnosis not present

## 2023-05-02 DIAGNOSIS — I110 Hypertensive heart disease with heart failure: Secondary | ICD-10-CM | POA: Diagnosis not present

## 2023-05-02 DIAGNOSIS — J449 Chronic obstructive pulmonary disease, unspecified: Secondary | ICD-10-CM | POA: Diagnosis not present

## 2023-05-02 DIAGNOSIS — I509 Heart failure, unspecified: Secondary | ICD-10-CM | POA: Diagnosis not present

## 2023-05-05 DIAGNOSIS — E1122 Type 2 diabetes mellitus with diabetic chronic kidney disease: Secondary | ICD-10-CM | POA: Diagnosis not present

## 2023-05-05 DIAGNOSIS — E1129 Type 2 diabetes mellitus with other diabetic kidney complication: Secondary | ICD-10-CM | POA: Diagnosis not present

## 2023-05-05 DIAGNOSIS — R809 Proteinuria, unspecified: Secondary | ICD-10-CM | POA: Diagnosis not present

## 2023-05-05 DIAGNOSIS — I5042 Chronic combined systolic (congestive) and diastolic (congestive) heart failure: Secondary | ICD-10-CM | POA: Diagnosis not present

## 2023-05-06 ENCOUNTER — Telehealth: Payer: Self-pay

## 2023-05-06 DIAGNOSIS — I509 Heart failure, unspecified: Secondary | ICD-10-CM | POA: Diagnosis not present

## 2023-05-06 DIAGNOSIS — L97521 Non-pressure chronic ulcer of other part of left foot limited to breakdown of skin: Secondary | ICD-10-CM | POA: Diagnosis not present

## 2023-05-06 DIAGNOSIS — Z794 Long term (current) use of insulin: Secondary | ICD-10-CM | POA: Diagnosis not present

## 2023-05-06 DIAGNOSIS — I110 Hypertensive heart disease with heart failure: Secondary | ICD-10-CM | POA: Diagnosis not present

## 2023-05-06 DIAGNOSIS — J449 Chronic obstructive pulmonary disease, unspecified: Secondary | ICD-10-CM | POA: Diagnosis not present

## 2023-05-06 DIAGNOSIS — E11621 Type 2 diabetes mellitus with foot ulcer: Secondary | ICD-10-CM | POA: Diagnosis not present

## 2023-05-06 NOTE — Telephone Encounter (Signed)
Spoke with pt advising her to continue taking her Jardiance along with her insulin and that at her next appt in February Dr.Nida will discuss other options such as Ozempic per Dr.Nida. Pt voiced understanding.

## 2023-05-06 NOTE — Telephone Encounter (Signed)
Pt called stating she was seen by her neurosurgeon and advised her to lose weight. Pt states her PCP recommended trying Ozempic but advised her to contact us for recommendation and options. Please advise.

## 2023-05-07 ENCOUNTER — Other Ambulatory Visit: Payer: Self-pay | Admitting: "Endocrinology

## 2023-05-07 DIAGNOSIS — E1159 Type 2 diabetes mellitus with other circulatory complications: Secondary | ICD-10-CM

## 2023-05-09 DIAGNOSIS — E11621 Type 2 diabetes mellitus with foot ulcer: Secondary | ICD-10-CM | POA: Diagnosis not present

## 2023-05-09 DIAGNOSIS — I110 Hypertensive heart disease with heart failure: Secondary | ICD-10-CM | POA: Diagnosis not present

## 2023-05-09 DIAGNOSIS — Z794 Long term (current) use of insulin: Secondary | ICD-10-CM | POA: Diagnosis not present

## 2023-05-09 DIAGNOSIS — J449 Chronic obstructive pulmonary disease, unspecified: Secondary | ICD-10-CM | POA: Diagnosis not present

## 2023-05-09 DIAGNOSIS — I509 Heart failure, unspecified: Secondary | ICD-10-CM | POA: Diagnosis not present

## 2023-05-09 DIAGNOSIS — L97521 Non-pressure chronic ulcer of other part of left foot limited to breakdown of skin: Secondary | ICD-10-CM | POA: Diagnosis not present

## 2023-05-12 ENCOUNTER — Telehealth: Payer: Self-pay | Admitting: Internal Medicine

## 2023-05-12 DIAGNOSIS — I509 Heart failure, unspecified: Secondary | ICD-10-CM | POA: Diagnosis not present

## 2023-05-12 DIAGNOSIS — L97521 Non-pressure chronic ulcer of other part of left foot limited to breakdown of skin: Secondary | ICD-10-CM | POA: Diagnosis not present

## 2023-05-12 DIAGNOSIS — E11621 Type 2 diabetes mellitus with foot ulcer: Secondary | ICD-10-CM | POA: Diagnosis not present

## 2023-05-12 DIAGNOSIS — J449 Chronic obstructive pulmonary disease, unspecified: Secondary | ICD-10-CM | POA: Diagnosis not present

## 2023-05-12 DIAGNOSIS — I110 Hypertensive heart disease with heart failure: Secondary | ICD-10-CM | POA: Diagnosis not present

## 2023-05-12 DIAGNOSIS — Z794 Long term (current) use of insulin: Secondary | ICD-10-CM | POA: Diagnosis not present

## 2023-05-12 NOTE — Telephone Encounter (Signed)
Norma [name] is still good till 08/21/23   Card No. [id number]  BIN [id number]  PCN [id number]  PC Group [id number]

## 2023-05-12 NOTE — Telephone Encounter (Signed)
Pt c/o medication issue:  1. Name of Medication: Evolocumab (REPATHA SURECLICK) 140 MG/ML SOAJ   2. How are you currently taking this medication (dosage and times per day)?   3. Are you having a reaction (difficulty breathing--STAT)?   4. What is your medication issue? Patient is requesting this be sent into [facility] again. States she is almost out of this medication.

## 2023-05-12 NOTE — Telephone Encounter (Addendum)
Patient identification verified by 2 forms. Marilynn Rail, RN    Called and spoke to patient  Relayed message below  Patient states:   -received paperwork for Repatha   -unsure where to bring it once completed  Informed patient:   -can bring paperwork to office once completed  Patient has no further questions at this time

## 2023-05-14 DIAGNOSIS — Z604 Social exclusion and rejection: Secondary | ICD-10-CM | POA: Diagnosis not present

## 2023-05-14 DIAGNOSIS — I130 Hypertensive heart and chronic kidney disease with heart failure and stage 1 through stage 4 chronic kidney disease, or unspecified chronic kidney disease: Secondary | ICD-10-CM | POA: Diagnosis not present

## 2023-05-14 DIAGNOSIS — F319 Bipolar disorder, unspecified: Secondary | ICD-10-CM | POA: Diagnosis not present

## 2023-05-14 DIAGNOSIS — L97521 Non-pressure chronic ulcer of other part of left foot limited to breakdown of skin: Secondary | ICD-10-CM | POA: Diagnosis not present

## 2023-05-14 DIAGNOSIS — Z87891 Personal history of nicotine dependence: Secondary | ICD-10-CM | POA: Diagnosis not present

## 2023-05-14 DIAGNOSIS — K760 Fatty (change of) liver, not elsewhere classified: Secondary | ICD-10-CM | POA: Diagnosis not present

## 2023-05-14 DIAGNOSIS — E66813 Obesity, class 3: Secondary | ICD-10-CM | POA: Diagnosis not present

## 2023-05-14 DIAGNOSIS — D1724 Benign lipomatous neoplasm of skin and subcutaneous tissue of left leg: Secondary | ICD-10-CM | POA: Diagnosis not present

## 2023-05-14 DIAGNOSIS — I509 Heart failure, unspecified: Secondary | ICD-10-CM | POA: Diagnosis not present

## 2023-05-14 DIAGNOSIS — J449 Chronic obstructive pulmonary disease, unspecified: Secondary | ICD-10-CM | POA: Diagnosis not present

## 2023-05-14 DIAGNOSIS — Z7982 Long term (current) use of aspirin: Secondary | ICD-10-CM | POA: Diagnosis not present

## 2023-05-14 DIAGNOSIS — E1142 Type 2 diabetes mellitus with diabetic polyneuropathy: Secondary | ICD-10-CM | POA: Diagnosis not present

## 2023-05-14 DIAGNOSIS — D509 Iron deficiency anemia, unspecified: Secondary | ICD-10-CM | POA: Diagnosis not present

## 2023-05-14 DIAGNOSIS — Z8673 Personal history of transient ischemic attack (TIA), and cerebral infarction without residual deficits: Secondary | ICD-10-CM | POA: Diagnosis not present

## 2023-05-14 DIAGNOSIS — E11621 Type 2 diabetes mellitus with foot ulcer: Secondary | ICD-10-CM | POA: Diagnosis not present

## 2023-05-14 DIAGNOSIS — F419 Anxiety disorder, unspecified: Secondary | ICD-10-CM | POA: Diagnosis not present

## 2023-05-14 DIAGNOSIS — D631 Anemia in chronic kidney disease: Secondary | ICD-10-CM | POA: Diagnosis not present

## 2023-05-14 DIAGNOSIS — Z6841 Body Mass Index (BMI) 40.0 and over, adult: Secondary | ICD-10-CM | POA: Diagnosis not present

## 2023-05-14 DIAGNOSIS — I2510 Atherosclerotic heart disease of native coronary artery without angina pectoris: Secondary | ICD-10-CM | POA: Diagnosis not present

## 2023-05-14 DIAGNOSIS — Z794 Long term (current) use of insulin: Secondary | ICD-10-CM | POA: Diagnosis not present

## 2023-05-14 DIAGNOSIS — M109 Gout, unspecified: Secondary | ICD-10-CM | POA: Diagnosis not present

## 2023-05-14 DIAGNOSIS — Z7984 Long term (current) use of oral hypoglycemic drugs: Secondary | ICD-10-CM | POA: Diagnosis not present

## 2023-05-14 DIAGNOSIS — E039 Hypothyroidism, unspecified: Secondary | ICD-10-CM | POA: Diagnosis not present

## 2023-05-14 DIAGNOSIS — N1832 Chronic kidney disease, stage 3b: Secondary | ICD-10-CM | POA: Diagnosis not present

## 2023-05-14 DIAGNOSIS — Z9981 Dependence on supplemental oxygen: Secondary | ICD-10-CM | POA: Diagnosis not present

## 2023-05-15 ENCOUNTER — Telehealth: Payer: Self-pay | Admitting: Pharmacy Technician

## 2023-05-15 ENCOUNTER — Telehealth (HOSPITAL_COMMUNITY): Payer: Medicare Other | Admitting: Psychiatry

## 2023-05-15 ENCOUNTER — Encounter (HOSPITAL_COMMUNITY): Payer: Self-pay | Admitting: Psychiatry

## 2023-05-15 DIAGNOSIS — F3131 Bipolar disorder, current episode depressed, mild: Secondary | ICD-10-CM | POA: Diagnosis not present

## 2023-05-15 DIAGNOSIS — F331 Major depressive disorder, recurrent, moderate: Secondary | ICD-10-CM

## 2023-05-15 DIAGNOSIS — Z5986 Financial insecurity: Secondary | ICD-10-CM

## 2023-05-15 MED ORDER — LAMOTRIGINE 150 MG PO TABS: 150.0000 mg | ORAL_TABLET | Freq: Every day | ORAL | 2 refills | Status: DC

## 2023-05-15 MED ORDER — VENLAFAXINE HCL ER 150 MG PO CP24: ORAL_CAPSULE | ORAL | 2 refills | Status: DC

## 2023-05-15 MED ORDER — BUPROPION HCL ER (XL) 150 MG PO TB24: 150.0000 mg | ORAL_TABLET | Freq: Every morning | ORAL | 2 refills | Status: DC

## 2023-05-15 MED ORDER — ARIPIPRAZOLE 5 MG PO TABS: 5.0000 mg | ORAL_TABLET | Freq: Every day | ORAL | 2 refills | Status: DC

## 2023-05-15 NOTE — Progress Notes (Signed)
Virtual Visit via Telephone Note  I connected with Norma [name] on 05/15/23 at 11:00 AM EST by telephone and verified that I am speaking with the correct person using two identifiers.  Location: Patient: home Provider: office   I discussed the limitations, risks, security and privacy concerns of performing an evaluation and management service by telephone and the availability of in person appointments. I also discussed with the patient that there may be a patient responsible charge related to this service. The patient expressed understanding and agreed to proceed.     I discussed the assessment and treatment plan with the patient. The patient was provided an opportunity to ask questions and all were answered. The patient agreed with the plan and demonstrated an understanding of the instructions.   The patient was advised to call back or seek an in-person evaluation if the symptoms worsen or if the condition fails to improve as anticipated.  I provided 20 minutes of non-face-to-face time during this encounter.   Diannia Ruder, MD  [facility] MD/PA/NP OP Progress Note  05/15/2023 11:21 AM Norma [name]  MRN:  [id number]  Chief Complaint:  Chief Complaint  Patient presents with   Depression   Anxiety   Follow-up   HPI: This patient is a 73 year old white female who is living with a roommate in [place].  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  The patient returns for follow-up after 3 months regarding her mood swings and depression.  Overall the patient states that she is doing fairly well.  Her roommate helps her out a lot.  She is a apparently a young woman who is pregnant and needed a place to stay.  She is helping the patient with housework and meals.  The patient is still dealing with neuropathy and radiculopathy pain in her legs and back.  She still needs to walk with a walker.  She was hospitalized at the end of December was sepsis although no one could figure out the actual  origin of it.  Health wise she is doing better now.  She denies significant depression anxiety thoughts of self-harm or suicide racing thoughts or severe mood swings.  She of very occasionally needs the Xanax.  She feels that the medications have helped considerably to stabilize her mood. Visit Diagnosis:    ICD-10-CM   1. Bipolar affective disorder, currently depressed, mild (HCC)  F31.31     2. Moderate episode of recurrent major depressive disorder (HCC)  F33.1       Past Psychiatric History:  She was hospitalized in 2001 and went through a course of ECT.  She has been tried on [employer] Wellbutrin and Zyprexa   Past Medical History:  Past Medical History:  Diagnosis Date   Anemia    Anxiety    Bipolar disorder (HCC)    Bulging lumbar disc    L3-4   Chronic daily headache    Chronic low back pain 09/20/2014   CKD (chronic kidney disease)    stage 3   COPD (chronic obstructive pulmonary disease) (HCC)    Coronary artery disease    CABG 2019   Degenerative arthritis    Depression    Diabetes mellitus, type II (HCC)    Diabetic neuropathy (HCC) 09/16/2018   DM type 2 with diabetic peripheral neuropathy (HCC) 09/20/2014   Dyslipidemia    Dyspnea    Family history of adverse reaction to anesthesia    father had reaction to anesthesia medication  per patient "they don't use it anymore"- unsure of reaction   Fatty liver    per pt report   Gastroparesis    GERD (gastroesophageal reflux disease)    Heart murmur    History of hiatal hernia    pt states she no longer has this   Hypertension    Hypothyroidism    IBS (irritable bowel syndrome)    LBBB (left bundle branch block)    Memory difficulty 09/20/2014   Morbid obesity (HCC)    Myocardial infarction (HCC)    per pt- prior to CABG   Neuropathy    Obstructive sleep apnea on CPAP    Renal insufficiency    Restless legs syndrome (RLS) 09/17/2012   Stroke (cerebrum) (HCC) 05/16/2017   Left parietal   Urticaria      Past Surgical History:  Procedure Laterality Date   ABDOMINAL HYSTERECTOMY     ARTHROSCOPY KNEE W/ DRILLING  06/2011   and Decemer of 2013.   Carpel tunnel  1980's   CATARACT EXTRACTION W/PHACO Right 03/21/2017   Procedure: CATARACT EXTRACTION PHACO AND INTRAOCULAR LENS PLACEMENT RIGHT EYE;  Surgeon: Fabio Pierce, MD;  Location: [facility];  Service: Ophthalmology;  Laterality: Right;  CDE: 2.91    CATARACT EXTRACTION W/PHACO Left 04/18/2017   Procedure: CATARACT EXTRACTION PHACO AND INTRAOCULAR LENS PLACEMENT LEFT EYE;  Surgeon: Fabio Pierce, MD;  Location: [facility];  Service: Ophthalmology;  Laterality: Left;  left   CHOLECYSTECTOMY     COLONOSCOPY WITH PROPOFOL N/A 10/04/2016   Procedure: COLONOSCOPY WITH PROPOFOL;  Surgeon: Malissa Hippo, MD;  Location: [facility] SU[facility];  Service: Endoscopy;  Laterality: N/A;  11:10   CORONARY ARTERY BYPASS GRAFT N/A 08/29/2017   Procedure: CORONARY ARTERY BYPASS GRAFTING (CABG) x 3 WITH ENDOSCOPIC HARVESTING OF RIGHT SAPHENOUS VEIN;  Surgeon: Kerin Perna, MD;  Location: [facility] OR;  Service: Open Heart Surgery;  Laterality: N/A;   ESOPHAGOGASTRODUODENOSCOPY N/A 04/25/2016   Procedure: ESOPHAGOGASTRODUODENOSCOPY (EGD);  Surgeon: Malissa Hippo, MD;  [facility]: [facility];  Service: Endoscopy;  Laterality: N/A;  730   LEFT HEART CATH AND CORONARY ANGIOGRAPHY N/A 08/27/2017   Procedure: LEFT HEART CATH AND CORONARY ANGIOGRAPHY;  Surgeon: Tonny Bollman, MD;  Location: [facility] INVASIVE CV LAB;  Service: Cardiovascular::  pLAD 95% - p-mLAD 50%, ostD1 90% -pD1 80%; ostOM1 90%; rPDA 80%. EF ~50-55% - HK of dital Anterolateral & Apical wall.  - Rec CVTS c/s   NECK SURGERY     pt reports having growth removed from the back of her neck- after 2021   OPEN REDUCTION INTERNAL FIXATION (ORIF) HAND Left 04/10/2020   Procedure: OPEN REDUCTION INTERNAL FIXATION (ORIF) HAND, left index finger;  Surgeon: Vickki Hearing, MD;  Location: [facility];  Service: Orthopedics;   Laterality: Left;  0.045 k wires   RECTAL SURGERY     fissure   SHOULDER SURGERY Left    arthroscopy in March of this year   TEE WITHOUT CARDIOVERSION N/A 08/29/2017   Procedure: TRANSESOPHAGEAL ECHOCARDIOGRAM (TEE);  Surgeon: Donata Clay, Theron Arista, MD;  Location: [facility] OR;  Service: Open Heart Surgery;  Laterality: N/A;   TRANSFORAMINAL LUMBAR INTERBODY FUSION W/ MIS 1 LEVEL Right 07/12/2021   Procedure: Minimally Invasive Surgery Transforaminal Lumbar Interbody Fusion  Lumbar four-five;  Surgeon: Bedelia Person, MD;  Location: [facility] OR;  Service: Neurosurgery;  Laterality: Right;   TRANSTHORACIC ECHOCARDIOGRAM  06/06/2017   Mild to moderate reduced EF 40 and 45%.  Anterior septal, inferoseptal and basal  to mid inferior hypokinesis.  GR 1 DD.  No significant valvular lesion    Family Psychiatric History: See below  Family History:  Family History  Problem Relation Age of Onset   Hypertension Mother    Lymphoma Mother    Depression Mother    Arthritis Father    Alcohol abuse Father    Hypertension Sister    Cancer Brother        kidney and lung   Alcohol abuse Brother    Alcohol abuse Paternal Uncle    Alcohol abuse Paternal Grandfather    Alcohol abuse Paternal Grandmother    Allergic rhinitis Neg Hx    Angioedema Neg Hx    Asthma Neg Hx    Atopy Neg Hx    Eczema Neg Hx    Immunodeficiency Neg Hx    Urticaria Neg Hx     Social History:  Social History   Socioeconomic History   Marital status: Single    Spouse name: Not on file   Number of children: 0   Years of education: 16   Highest education level: Not on file  Occupational History    Employer: [employer]  Tobacco Use   Smoking status: Former    Current packs/day: 0.00    Types: Cigarettes    Quit date: 02/04/1971    Years since quitting: 52.3   Smokeless tobacco: Never   Tobacco comments:    smoked 2 cigarettes a day  Vaping Use   Vaping status: Never Used  Substance and Sexual Activity   Alcohol  use: No    Alcohol/week: 0.0 standard drinks of alcohol   Drug use: No   Sexual activity: Never  Other Topics Concern   Not on file  Social History Narrative   Patient lives at home alone.    Patient has no children.    Patient has her masters in nursing.    Patient is single.    Patient drinks about 2 glasses of tea daily.   Patient is right handed.   Social Drivers of Health   Financial Resource Strain: Medium Risk (12/18/2021)   Overall Financial Resource Strain (CARDIA)    Difficulty of Paying Living Expenses: Somewhat hard  Food Insecurity: No Food Insecurity (04/13/2023)   Hunger Vital Sign    Worried About Running Out of Food in the Last Year: Never true    Ran Out of Food in the Last Year: Never true  Transportation Needs: No Transportation Needs (04/13/2023)   PRAPARE - Administrator, Civil Service (Medical): No    Lack of Transportation (Non-Medical): No  Physical Activity: Not on file  Stress: No Stress Concern Present (10/04/2021)   [employer] of Occupational Health - Occupational Stress Questionnaire    Feeling of Stress : Only a little  Social Connections: Not on file    Allergies:  Allergies  Allergen Reactions   Amoxil [Amoxicillin] Anaphylaxis   Hydrocodone Anaphylaxis   Percocet [Oxycodone-Acetaminophen] Other (See Comments)    Causes chest pain /tightness. Pressure pain around her ribs.   Depacon [Valproic Acid] Other (See Comments)    Causes falls    Prednisone Itching   Roxicodone [Oxycodone] Other (See Comments)    Chest pain, tightness    Metabolic Disorder Labs: Lab Results  Component Value Date   HGBA1C 7.8 (A) 02/04/2023   MPG 165.68 07/05/2021   MPG 148 03/05/2019   No results found for: "PROLACTIN" Lab Results  Component Value Date  CHOL 139 10/09/2022   TRIG 123 10/09/2022   HDL 55 10/09/2022   CHOLHDL 3.8 03/05/2019   VLDL 31 (H) 07/10/2016   LDLCALC 62 10/09/2022   LDLCALC 43 11/08/2019   Lab Results   Component Value Date   TSH 1.304 04/14/2023   TSH 1.73 10/09/2022    Therapeutic Level Labs: No results found for: "LITHIUM" No results found for: "VALPROATE" No results found for: "CBMZ"  Current Medications: Current Outpatient Medications  Medication Sig Dispense Refill   acetaminophen (TYLENOL) 500 MG tablet Take 1,000 mg by mouth daily as needed for moderate pain (pain score 4-6), fever or headache.     ALPRAZolam (XANAX) 0.25 MG tablet Take 1 tablet (0.25 mg total) by mouth at bedtime as needed for anxiety or sleep. 30 tablet 2   ARIPiprazole (ABILIFY) 5 MG tablet Take 1 tablet (5 mg total) by mouth daily. 30 tablet 2   aspirin EC 81 MG tablet Take 1 tablet (81 mg total) by mouth daily. 90 tablet 3   azelastine (ASTELIN) 0.1 % nasal spray Place 1 spray into both nostrils 2 (two) times daily as needed for allergies.     buPROPion (WELLBUTRIN XL) 150 MG 24 hr tablet Take 1 tablet (150 mg total) by mouth every morning. 30 tablet 2   carvedilol (COREG) 6.25 MG tablet Take 1 tablet (6.25 mg total) by mouth 2 (two) times daily with a meal. 180 tablet 2   Cholecalciferol (VITAMIN D3) 50 MCG (2000 UT) capsule Take 1 capsule (2,000 Units total) by mouth daily with lunch. 90 capsule 3   diclofenac Sodium (VOLTAREN) 1 % GEL Apply 2 g topically 4 (four) times daily as needed (pain).     empagliflozin (JARDIANCE) 10 MG TABS tablet Take 5 mg daily     Evolocumab (REPATHA SURECLICK) 140 MG/ML SOAJ INJECT 1 DOSE UNDER THE SKIN EVERY 14 DAYS 6 mL 0   gabapentin (NEURONTIN) 400 MG capsule Take 400-800 mg by mouth See admin instructions. Take 1 capsule (400mg ) every morning and 2 capsules (800mg ) at bedtime.     insulin glargine, 1 Unit Dial, (TOUJEO SOLOSTAR) 300 UNIT/ML Solostar Pen Inject 50 Units into the skin at bedtime. 13.5 mL 2   lamoTRIgine (LAMICTAL) 150 MG tablet Take 1 tablet (150 mg total) by mouth at bedtime. 30 tablet 2   levothyroxine (SYNTHROID) 112 MCG tablet Take 112 mcg by mouth  daily.     OVER THE COUNTER MEDICATION Apply 1 application  topically daily as needed (pain). CBD salve     pantoprazole (PROTONIX) 40 MG tablet Take 1 tablet by mouth daily before supper. (Patient taking differently: Take 40 mg by mouth at bedtime.) 90 tablet 3   rOPINIRole (REQUIP) 3 MG tablet TAKE ONE TABLET BY MOUTH EVERY MORNING, ,NOON AND AT BEDTIME. (PRT PT MORNING,EVENING,BEDTIME) (Patient taking differently: Take 3-6 mg by mouth See admin instructions. Take 1 tablet (3mg ) every morning and 2 tablets (6mg ) at bedtime.) 270 tablet 3   venlafaxine XR (EFFEXOR-XR) 150 MG 24 hr capsule TAKE TWO (2) CAPSULES BY MOUTH AT BEDTIME 60 capsule 2   No current facility-administered medications for this visit.     Musculoskeletal: Strength & Muscle Tone: na Gait & Station: na Patient leans: N/A  Psychiatric Specialty Exam: Review of Systems  Musculoskeletal:  Positive for arthralgias, back pain, gait problem and myalgias.  All other systems reviewed and are negative.   There were no vitals taken for this visit.There is no height or weight on file  to calculate BMI.  General Appearance: NA  Eye Contact:  NA  Speech:  Clear and Coherent  Volume:  Normal  Mood:  Euthymic  Affect:  Congruent  Thought Process:  Goal Directed  Orientation:  Full (Time, Place, and Person)  Thought Content: WDL   Suicidal Thoughts:  No  Homicidal Thoughts:  No  Memory:  Immediate;   Good Recent;   Good Remote;   Fair  Judgement:  Good  Insight:  Good  Psychomotor Activity:  Decreased  Concentration:  Concentration: Good and Attention Span: Good  Recall:  Good  Fund of Knowledge: Good  Language: Good  Akathisia:  No  Handed:  Right  AIMS (if indicated): not done  Assets:  Communication Skills Desire for Improvement Resilience Social Support Talents/Skills  ADL's:  Intact  Cognition: WNL  Sleep:  Good   Screenings: Mini-Mental    Flowsheet Row Office Visit from 02/18/2018 in [facility] Office Visit from 09/17/2017 in [facility]  Total Score (max 30 points ) 28 28      PHQ2-9    Flowsheet Row Video Visit from 08/13/2022 in [facility] [place] Video Visit from 01/21/2022 in [facility] [place] Video Visit from 11/26/2021 in [facility] Coordination from 11/14/2021 in [facility] Patient Outreach Telephone from 10/04/2021 in [facility] Coord[facility]  PHQ-2 Total Score 1 1 4 2 1   PHQ-9 Total Score -- -- 9 11 --      Flowsheet Row ED to Hosp-Admission (Discharged) from 04/13/2023 in [facility] Admission (Discharged) from 04/10/2023 in [facility] Video Visit from 08/13/2022 in [facility] [place]  C-SSRS RISK CATEGORY No Risk No Risk No Risk        Assessment and Plan: This patient is a 73 year old female with a history of depression anxiety mood swings and insomnia.  She continues to do well on her current regimen.  She will continue Wellbutrin XL 150 mg daily as well as Effexor XR 300 mg daily for depression, Abilify 5 mg daily for augmentation, Lamictal 100 mg daily for mood stabilization and Xanax 0.25 mg at bedtime as needed for sleep or anxiety.  She will return to see me in 3 months  Collaboration of Care: Collaboration of Care: Primary Care Provider AEB notes will be shared with PCP at patient's request  Patient/Guardian was advised Release of Information must be obtained prior to any record release in order to collaborate their care with an outside provider. Patient/Guardian was advised if they have not already done so to contact the registration department to sign all necessary forms in order for [place] to release information regarding their care.   Consent:  Patient/Guardian gives verbal consent for treatment and assignment of benefits for services provided during this visit. Patient/Guardian expressed understanding and agreed to proceed.    Diannia Ruder, MD 05/15/2023, 11:21 AM

## 2023-05-15 NOTE — Progress Notes (Addendum)
 Pharmacy Medication Assistance Program Note    05/15/2023  Patient ID: Norma [name], female   DOB: 24-Nov-1950, 73 y.o.   MRN: [id number]     05/15/2023  Outreach Medication One  Initial Outreach Date (Medication One) 01/28/2023  Manufacturer Medication One Sanofi  [employer] Drugs Toujeo  Dose of Toujeo Solostar 300 units/ml  Type of Radiographer, therapeutic Assistance  Date Application Sent to Patient 01/30/2023  Application Items Requested Application;Proof of Income;Other  Date Application Sent to Prescriber 01/30/2023  Name of Prescriber Purcell Nails        05/15/2023  Outreach Medication Two  Initial Outreach Date (Medication Two) 01/28/2023  Manufacturer Medication Two [employer]  [employer] Drugs Jardiance  Dose of Jardiance 10mg   Type of Radiographer, therapeutic Assistance  Date Application Sent to Patient 01/30/2023  Application Items Requested Application;Proof of Income;Other  Date Application Sent to Prescriber 01/30/2023  Name of Prescriber Nita Sells  Date Application Received From Provider 04/02/2023  Patient Assistance Determination Approved  Approval Start Date 04/16/2023  Patient Notification Method Telephone Call  Telephone Call Outcome Successful   Successful outreach to patient. HIPAA verified. Patient informs she is approved for 2025 with BI for Jardiance. She informs she has been paying for Toujeo but is interested in the [employer]ofi patient assistance program. She informs she has the [employer] patient assistance program application that was mailed to her in October but was not able to locate the return envelope. She requested the address to mail back the information be emailed to her at [email or web address]. Emailed patient the address to mail back her application.  Care coordination call placed to BI in regard to [facility] application. Per IVR system, patient is APPROVED 04/16/23-04/14/24. First shipment will be processed automatically but  subsequent refills will have to phoned in by the patient by calling [phone].  Will await return of Sanofi application.  ADDENDUM 06/05/2023 Unsuccessful outreach call to patient. HIPAA complaint voicemail left inquiring if patient had mailed back the Sanofi application. Will attempt another outreach call.   ADDENDUM 06/12/2023 Patient returned the  call and was unable to find every page of the [facility] application that she had and has requested another one be mailed to her. Will place Sanofi application in mail when in office next.  ADDENDUM 06/19/23 Application mailed to patient today.  [name], CPhT [place]  Office: [phone] Fax: [phone] Email: [email or web address]

## 2023-05-19 DIAGNOSIS — E11621 Type 2 diabetes mellitus with foot ulcer: Secondary | ICD-10-CM | POA: Diagnosis not present

## 2023-05-19 DIAGNOSIS — N1832 Chronic kidney disease, stage 3b: Secondary | ICD-10-CM | POA: Diagnosis not present

## 2023-05-19 DIAGNOSIS — I130 Hypertensive heart and chronic kidney disease with heart failure and stage 1 through stage 4 chronic kidney disease, or unspecified chronic kidney disease: Secondary | ICD-10-CM | POA: Diagnosis not present

## 2023-05-19 DIAGNOSIS — L97521 Non-pressure chronic ulcer of other part of left foot limited to breakdown of skin: Secondary | ICD-10-CM | POA: Diagnosis not present

## 2023-05-19 DIAGNOSIS — J449 Chronic obstructive pulmonary disease, unspecified: Secondary | ICD-10-CM | POA: Diagnosis not present

## 2023-05-19 DIAGNOSIS — I509 Heart failure, unspecified: Secondary | ICD-10-CM | POA: Diagnosis not present

## 2023-05-21 DIAGNOSIS — Z8673 Personal history of transient ischemic attack (TIA), and cerebral infarction without residual deficits: Secondary | ICD-10-CM | POA: Diagnosis not present

## 2023-05-21 DIAGNOSIS — Z79899 Other long term (current) drug therapy: Secondary | ICD-10-CM | POA: Diagnosis not present

## 2023-05-21 DIAGNOSIS — I10 Essential (primary) hypertension: Secondary | ICD-10-CM | POA: Diagnosis not present

## 2023-05-21 DIAGNOSIS — E11621 Type 2 diabetes mellitus with foot ulcer: Secondary | ICD-10-CM | POA: Diagnosis not present

## 2023-05-21 DIAGNOSIS — L97521 Non-pressure chronic ulcer of other part of left foot limited to breakdown of skin: Secondary | ICD-10-CM | POA: Diagnosis not present

## 2023-05-21 DIAGNOSIS — Z87891 Personal history of nicotine dependence: Secondary | ICD-10-CM | POA: Diagnosis not present

## 2023-05-21 DIAGNOSIS — Z7982 Long term (current) use of aspirin: Secondary | ICD-10-CM | POA: Diagnosis not present

## 2023-05-21 DIAGNOSIS — J449 Chronic obstructive pulmonary disease, unspecified: Secondary | ICD-10-CM | POA: Diagnosis not present

## 2023-05-21 DIAGNOSIS — Z7984 Long term (current) use of oral hypoglycemic drugs: Secondary | ICD-10-CM | POA: Diagnosis not present

## 2023-05-21 DIAGNOSIS — E039 Hypothyroidism, unspecified: Secondary | ICD-10-CM | POA: Diagnosis not present

## 2023-05-21 DIAGNOSIS — E1140 Type 2 diabetes mellitus with diabetic neuropathy, unspecified: Secondary | ICD-10-CM | POA: Diagnosis not present

## 2023-05-21 DIAGNOSIS — Z880 Allergy status to penicillin: Secondary | ICD-10-CM | POA: Diagnosis not present

## 2023-05-23 DIAGNOSIS — I509 Heart failure, unspecified: Secondary | ICD-10-CM | POA: Diagnosis not present

## 2023-05-23 DIAGNOSIS — I130 Hypertensive heart and chronic kidney disease with heart failure and stage 1 through stage 4 chronic kidney disease, or unspecified chronic kidney disease: Secondary | ICD-10-CM | POA: Diagnosis not present

## 2023-05-23 DIAGNOSIS — L97521 Non-pressure chronic ulcer of other part of left foot limited to breakdown of skin: Secondary | ICD-10-CM | POA: Diagnosis not present

## 2023-05-23 DIAGNOSIS — E11621 Type 2 diabetes mellitus with foot ulcer: Secondary | ICD-10-CM | POA: Diagnosis not present

## 2023-05-23 DIAGNOSIS — J449 Chronic obstructive pulmonary disease, unspecified: Secondary | ICD-10-CM | POA: Diagnosis not present

## 2023-05-23 DIAGNOSIS — N1832 Chronic kidney disease, stage 3b: Secondary | ICD-10-CM | POA: Diagnosis not present

## 2023-05-26 DIAGNOSIS — I509 Heart failure, unspecified: Secondary | ICD-10-CM | POA: Diagnosis not present

## 2023-05-26 DIAGNOSIS — I130 Hypertensive heart and chronic kidney disease with heart failure and stage 1 through stage 4 chronic kidney disease, or unspecified chronic kidney disease: Secondary | ICD-10-CM | POA: Diagnosis not present

## 2023-05-26 DIAGNOSIS — N1832 Chronic kidney disease, stage 3b: Secondary | ICD-10-CM | POA: Diagnosis not present

## 2023-05-26 DIAGNOSIS — J449 Chronic obstructive pulmonary disease, unspecified: Secondary | ICD-10-CM | POA: Diagnosis not present

## 2023-05-26 DIAGNOSIS — E11621 Type 2 diabetes mellitus with foot ulcer: Secondary | ICD-10-CM | POA: Diagnosis not present

## 2023-05-26 DIAGNOSIS — L97521 Non-pressure chronic ulcer of other part of left foot limited to breakdown of skin: Secondary | ICD-10-CM | POA: Diagnosis not present

## 2023-05-27 ENCOUNTER — Other Ambulatory Visit (INDEPENDENT_AMBULATORY_CARE_PROVIDER_SITE_OTHER): Payer: Self-pay | Admitting: Gastroenterology

## 2023-05-29 DIAGNOSIS — N1832 Chronic kidney disease, stage 3b: Secondary | ICD-10-CM | POA: Diagnosis not present

## 2023-05-29 DIAGNOSIS — I130 Hypertensive heart and chronic kidney disease with heart failure and stage 1 through stage 4 chronic kidney disease, or unspecified chronic kidney disease: Secondary | ICD-10-CM | POA: Diagnosis not present

## 2023-05-29 DIAGNOSIS — J449 Chronic obstructive pulmonary disease, unspecified: Secondary | ICD-10-CM | POA: Diagnosis not present

## 2023-05-29 DIAGNOSIS — I509 Heart failure, unspecified: Secondary | ICD-10-CM | POA: Diagnosis not present

## 2023-05-29 DIAGNOSIS — L97521 Non-pressure chronic ulcer of other part of left foot limited to breakdown of skin: Secondary | ICD-10-CM | POA: Diagnosis not present

## 2023-05-29 DIAGNOSIS — E11621 Type 2 diabetes mellitus with foot ulcer: Secondary | ICD-10-CM | POA: Diagnosis not present

## 2023-06-02 DIAGNOSIS — I509 Heart failure, unspecified: Secondary | ICD-10-CM | POA: Diagnosis not present

## 2023-06-02 DIAGNOSIS — N1832 Chronic kidney disease, stage 3b: Secondary | ICD-10-CM | POA: Diagnosis not present

## 2023-06-02 DIAGNOSIS — I130 Hypertensive heart and chronic kidney disease with heart failure and stage 1 through stage 4 chronic kidney disease, or unspecified chronic kidney disease: Secondary | ICD-10-CM | POA: Diagnosis not present

## 2023-06-02 DIAGNOSIS — L97521 Non-pressure chronic ulcer of other part of left foot limited to breakdown of skin: Secondary | ICD-10-CM | POA: Diagnosis not present

## 2023-06-02 DIAGNOSIS — J449 Chronic obstructive pulmonary disease, unspecified: Secondary | ICD-10-CM | POA: Diagnosis not present

## 2023-06-02 DIAGNOSIS — E11621 Type 2 diabetes mellitus with foot ulcer: Secondary | ICD-10-CM | POA: Diagnosis not present

## 2023-06-06 ENCOUNTER — Encounter: Payer: Self-pay | Admitting: Orthopedic Surgery

## 2023-06-06 ENCOUNTER — Ambulatory Visit: Payer: Medicare Other | Admitting: Orthopedic Surgery

## 2023-06-06 DIAGNOSIS — G63 Polyneuropathy in diseases classified elsewhere: Secondary | ICD-10-CM

## 2023-06-06 DIAGNOSIS — M5416 Radiculopathy, lumbar region: Secondary | ICD-10-CM | POA: Diagnosis not present

## 2023-06-06 DIAGNOSIS — M1712 Unilateral primary osteoarthritis, left knee: Secondary | ICD-10-CM

## 2023-06-06 DIAGNOSIS — J449 Chronic obstructive pulmonary disease, unspecified: Secondary | ICD-10-CM | POA: Diagnosis not present

## 2023-06-06 DIAGNOSIS — M1711 Unilateral primary osteoarthritis, right knee: Secondary | ICD-10-CM

## 2023-06-06 DIAGNOSIS — L97521 Non-pressure chronic ulcer of other part of left foot limited to breakdown of skin: Secondary | ICD-10-CM | POA: Diagnosis not present

## 2023-06-06 DIAGNOSIS — I509 Heart failure, unspecified: Secondary | ICD-10-CM | POA: Diagnosis not present

## 2023-06-06 DIAGNOSIS — E11621 Type 2 diabetes mellitus with foot ulcer: Secondary | ICD-10-CM | POA: Diagnosis not present

## 2023-06-06 DIAGNOSIS — I130 Hypertensive heart and chronic kidney disease with heart failure and stage 1 through stage 4 chronic kidney disease, or unspecified chronic kidney disease: Secondary | ICD-10-CM | POA: Diagnosis not present

## 2023-06-06 DIAGNOSIS — M170 Bilateral primary osteoarthritis of knee: Secondary | ICD-10-CM

## 2023-06-06 DIAGNOSIS — N1832 Chronic kidney disease, stage 3b: Secondary | ICD-10-CM | POA: Diagnosis not present

## 2023-06-06 NOTE — Progress Notes (Signed)
 Patient ID: Norma [name], female   DOB: 1951/02/04, 73 y.o.   MRN: [id number] Encounter Diagnoses  Name Primary?   Polyneuropathy associated with underlying disease (HCC) Yes   Primary osteoarthritis of right knee    Primary osteoarthritis of left knee    Radiculopathy, lumbar region      Norma [name] came in today asking for "a miracle"  She has right leg pain and right knee pain left knee pain radiculopathy from the lumbar spine  She was able to obtain the nerve conduction study results from [facility] and they were in epic and it says  Evidence of chronic lumbosacral radiculopathy affecting bilateral L4 and L5 nerve roots with no active denervation  Evidence of diffuse sensory motor axonal and demyelinating peripheral neuropathy in both lower extremities most likely secondary to uncontrolled diabetes  Unfortunately as I told her today she has a BMI of 50+ and we cannot do surgery on her.  We discussed possibilities of injection or surgery to help her lose weight.  She will see the endocrinologist to see if she is eligible for some of the newer medications to help with weight loss  Unfortunately we cannot help her at this time with the other 2 conditions but if she loses weight we can help her with a knee replacement

## 2023-06-06 NOTE — Progress Notes (Signed)
   There were no vitals taken for this visit.  There is no height or weight on file to calculate BMI.  Chief Complaint  Patient presents with   Knee Pain    BILATERAL can't walk with knees extended, walking with knees flexed states injections dont help     No diagnosis found.  DOI/DOS/ Date: long duration   Worse

## 2023-06-09 DIAGNOSIS — J449 Chronic obstructive pulmonary disease, unspecified: Secondary | ICD-10-CM | POA: Diagnosis not present

## 2023-06-09 DIAGNOSIS — N1832 Chronic kidney disease, stage 3b: Secondary | ICD-10-CM | POA: Diagnosis not present

## 2023-06-09 DIAGNOSIS — I509 Heart failure, unspecified: Secondary | ICD-10-CM | POA: Diagnosis not present

## 2023-06-09 DIAGNOSIS — L97521 Non-pressure chronic ulcer of other part of left foot limited to breakdown of skin: Secondary | ICD-10-CM | POA: Diagnosis not present

## 2023-06-09 DIAGNOSIS — E11621 Type 2 diabetes mellitus with foot ulcer: Secondary | ICD-10-CM | POA: Diagnosis not present

## 2023-06-09 DIAGNOSIS — I130 Hypertensive heart and chronic kidney disease with heart failure and stage 1 through stage 4 chronic kidney disease, or unspecified chronic kidney disease: Secondary | ICD-10-CM | POA: Diagnosis not present

## 2023-06-11 ENCOUNTER — Encounter: Payer: Self-pay | Admitting: "Endocrinology

## 2023-06-11 ENCOUNTER — Ambulatory Visit (INDEPENDENT_AMBULATORY_CARE_PROVIDER_SITE_OTHER): Payer: Medicare Other | Admitting: "Endocrinology

## 2023-06-11 VITALS — BP 106/62 | HR 52 | Ht 60.0 in | Wt 258.8 lb

## 2023-06-11 DIAGNOSIS — E1159 Type 2 diabetes mellitus with other circulatory complications: Secondary | ICD-10-CM | POA: Diagnosis not present

## 2023-06-11 DIAGNOSIS — E785 Hyperlipidemia, unspecified: Secondary | ICD-10-CM | POA: Diagnosis not present

## 2023-06-11 DIAGNOSIS — I10 Essential (primary) hypertension: Secondary | ICD-10-CM

## 2023-06-11 DIAGNOSIS — Z7985 Long-term (current) use of injectable non-insulin antidiabetic drugs: Secondary | ICD-10-CM

## 2023-06-11 DIAGNOSIS — E1169 Type 2 diabetes mellitus with other specified complication: Secondary | ICD-10-CM | POA: Diagnosis not present

## 2023-06-11 DIAGNOSIS — Z794 Long term (current) use of insulin: Secondary | ICD-10-CM

## 2023-06-11 DIAGNOSIS — E038 Other specified hypothyroidism: Secondary | ICD-10-CM

## 2023-06-11 MED ORDER — TIRZEPATIDE 2.5 MG/0.5ML ~~LOC~~ SOAJ: 2.5000 mg | SUBCUTANEOUS | 0 refills | Status: DC

## 2023-06-11 NOTE — Progress Notes (Signed)
 06/11/2023, 2:42 PM   Endocrinology follow-up note  Subjective:    Patient ID: Norma [name], female    DOB: 11-Dec-1950.  Sherry Ruffing is being seen in follow-up after she was seen in consultation for management of currently uncontrolled symptomatic diabetes requested by  [name], MD.   Past Medical History:  Diagnosis Date   Anemia    Anxiety    Bipolar disorder (HCC)    Bulging lumbar disc    L3-4   Chronic daily headache    Chronic low back pain 09/20/2014   CKD (chronic kidney disease)    stage 3   COPD (chronic obstructive pulmonary disease) (HCC)    Coronary artery disease    CABG 2019   Degenerative arthritis    Depression    Diabetes mellitus, type II (HCC)    Diabetic neuropathy (HCC) 09/16/2018   DM type 2 with diabetic peripheral neuropathy (HCC) 09/20/2014   Dyslipidemia    Dyspnea    Family history of adverse reaction to anesthesia    father had reaction to anesthesia medication per patient "they don't use it anymore"- unsure of reaction   Fatty liver    per pt report   Gastroparesis    GERD (gastroesophageal reflux disease)    Heart murmur    History of hiatal hernia    pt states she no longer has this   Hypertension    Hypothyroidism    IBS (irritable bowel syndrome)    LBBB (left bundle branch block)    Memory difficulty 09/20/2014   Morbid obesity (HCC)    Myocardial infarction (HCC)    per pt- prior to CABG   Neuropathy    Obstructive sleep apnea on CPAP    Renal insufficiency    Restless legs syndrome (RLS) 09/17/2012   Stroke (cerebrum) (HCC) 05/16/2017   Left parietal   Urticaria     Past Surgical History:  Procedure Laterality Date   ABDOMINAL HYSTERECTOMY     ARTHROSCOPY KNEE W/ DRILLING  06/2011   and Decemer of 2013.   Carpel tunnel  1980's   CATARACT EXTRACTION W/PHACO Right 03/21/2017   Procedure: CATARACT EXTRACTION PHACO AND  INTRAOCULAR LENS PLACEMENT RIGHT EYE;  Surgeon: Fabio Pierce, MD;  Location: [facility];  Service: Ophthalmology;  Laterality: Right;  CDE: 2.91    CATARACT EXTRACTION W/PHACO Left 04/18/2017   Procedure: CATARACT EXTRACTION PHACO AND INTRAOCULAR LENS PLACEMENT LEFT EYE;  Surgeon: Fabio Pierce, MD;  Location: [facility];  Service: Ophthalmology;  Laterality: Left;  left   CHOLECYSTECTOMY     COLONOSCOPY WITH PROPOFOL N/A 10/04/2016   Procedure: COLONOSCOPY WITH PROPOFOL;  Surgeon: Malissa Hippo, MD;  Location: [facility];  Service: Endoscopy;  Laterality: N/A;  11:10   CORONARY ARTERY BYPASS GRAFT N/A 08/29/2017   Procedure: CORONARY ARTERY BYPASS GRAFTING (CABG) x 3 WITH ENDOSCOPIC HARVESTING OF RIGHT SAPHENOUS VEIN;  Surgeon: Kerin Perna, MD;  Location: [facility] OR;  Service: Open Heart Surgery;  Laterality: N/A;   ESOPHAGOGASTRODUODENOSCOPY N/A 04/25/2016   Procedure: ESOPHAGOGASTRODUODENOSCOPY (EGD);  Surgeon: Malissa Hippo, MD;  [facility]:  [facility];  Service: Endoscopy;  Laterality: N/A;  730   ESOPHAGOGASTRODUODENOSCOPY  03/2023   LEFT HEART CATH AND CORONARY ANGIOGRAPHY N/A 08/27/2017   Procedure: LEFT HEART CATH AND CORONARY ANGIOGRAPHY;  Surgeon: Tonny Bollman, MD;  Location: [facility] INVASIVE CV LAB;  Service: Cardiovascular::  pLAD 95% - p-mLAD 50%, ostD1 90% -pD1 80%; ostOM1 90%; rPDA 80%. EF ~50-55% - HK of dital Anterolateral & Apical wall.  - Rec CVTS c/s   NECK SURGERY     pt reports having growth removed from the back of her neck- after 2021   OPEN REDUCTION INTERNAL FIXATION (ORIF) HAND Left 04/10/2020   Procedure: OPEN REDUCTION INTERNAL FIXATION (ORIF) HAND, left index finger;  Surgeon: Vickki Hearing, MD;  Location: [facility];  Service: Orthopedics;  Laterality: Left;  0.045 k wires   RECTAL SURGERY     fissure   SHOULDER SURGERY Left    arthroscopy in March of this year   TEE WITHOUT CARDIOVERSION N/A 08/29/2017   Procedure: TRANSESOPHAGEAL ECHOCARDIOGRAM (TEE);   Surgeon: Donata Clay, Theron Arista, MD;  Location: [facility] OR;  Service: Open Heart Surgery;  Laterality: N/A;   TRANSFORAMINAL LUMBAR INTERBODY FUSION W/ MIS 1 LEVEL Right 07/12/2021   Procedure: Minimally Invasive Surgery Transforaminal Lumbar Interbody Fusion  Lumbar four-five;  Surgeon: Bedelia Person, MD;  Location: [facility] OR;  Service: Neurosurgery;  Laterality: Right;   TRANSTHORACIC ECHOCARDIOGRAM  06/06/2017   Mild to moderate reduced EF 40 and 45%.  Anterior septal, inferoseptal and basal to mid inferior hypokinesis.  GR 1 DD.  No significant valvular lesion    Social History   Socioeconomic History   Marital status: Single    Spouse name: Not on file   Number of children: 0   Years of education: 16   Highest education level: Not on file  Occupational History    Employer: [employer]  Tobacco Use   Smoking status: Former    Current packs/day: 0.00    Types: Cigarettes    Quit date: 02/04/1971    Years since quitting: 52.3   Smokeless tobacco: Never   Tobacco comments:    smoked 2 cigarettes a day  Vaping Use   Vaping status: Never Used  Substance and Sexual Activity   Alcohol use: No    Alcohol/week: 0.0 standard drinks of alcohol   Drug use: No   Sexual activity: Never  Other Topics Concern   Not on file  Social History Narrative   Patient lives at home alone.    Patient has no children.    Patient has her masters in nursing.    Patient is single.    Patient drinks about 2 glasses of tea daily.   Patient is right handed.   Social Drivers of Health   Financial Resource Strain: Medium Risk (12/18/2021)   Overall Financial Resource Strain (CARDIA)    Difficulty of Paying Living Expenses: Somewhat hard  Food Insecurity: No Food Insecurity (04/13/2023)   Hunger Vital Sign    Worried About Running Out of Food in the Last Year: Never true    Ran Out of Food in the Last Year: Never true  Transportation Needs: No Transportation Needs (04/13/2023)   PRAPARE -  Administrator, Civil Service (Medical): No    Lack of Transportation (Non-Medical): No  Physical Activity: Not on file  Stress: No Stress Concern Present (10/04/2021)   [employer] of Occupational Health - Occupational Stress Questionnaire    Feeling of Stress :  Only a little  Social Connections: Not on file    Family History  Problem Relation Age of Onset   Hypertension Mother    Lymphoma Mother    Depression Mother    Arthritis Father    Alcohol abuse Father    Hypertension Sister    Cancer Brother        kidney and lung   Alcohol abuse Brother    Alcohol abuse Paternal Uncle    Alcohol abuse Paternal Grandfather    Alcohol abuse Paternal Grandmother    Allergic rhinitis Neg Hx    Angioedema Neg Hx    Asthma Neg Hx    Atopy Neg Hx    Eczema Neg Hx    Immunodeficiency Neg Hx    Urticaria Neg Hx     Outpatient Encounter Medications as of 06/11/2023  Medication Sig   tirzepatide (MOUNJARO) 2.5 MG/0.5ML Pen Inject 2.5 mg into the skin once a week.   acetaminophen (TYLENOL) 500 MG tablet Take 1,000 mg by mouth daily as needed for moderate pain (pain score 4-6), fever or headache.   ALPRAZolam (XANAX) 0.25 MG tablet Take 1 tablet (0.25 mg total) by mouth at bedtime as needed for anxiety or sleep.   ARIPiprazole (ABILIFY) 5 MG tablet Take 1 tablet (5 mg total) by mouth daily.   aspirin EC 81 MG tablet Take 1 tablet (81 mg total) by mouth daily.   azelastine (ASTELIN) 0.1 % nasal spray Place 1 spray into both nostrils 2 (two) times daily as needed for allergies.   buPROPion (WELLBUTRIN XL) 150 MG 24 hr tablet Take 1 tablet (150 mg total) by mouth every morning.   carvedilol (COREG) 6.25 MG tablet Take 1 tablet (6.25 mg total) by mouth 2 (two) times daily with a meal.   Cholecalciferol (VITAMIN D3) 50 MCG (2000 UT) capsule Take 1 capsule (2,000 Units total) by mouth daily with lunch. (Patient not taking: Reported on 06/11/2023)   diclofenac Sodium (VOLTAREN) 1 %  GEL Apply 2 g topically 4 (four) times daily as needed (pain).   empagliflozin (JARDIANCE) 10 MG TABS tablet Take 5 mg daily (Patient taking differently: 10 mg.)   Evolocumab (REPATHA SURECLICK) 140 MG/ML SOAJ INJECT 1 DOSE UNDER THE SKIN EVERY 14 DAYS   gabapentin (NEURONTIN) 400 MG capsule Take 400-800 mg by mouth See admin instructions. Take 1 capsule (400mg ) every morning and 2 capsules (800mg ) at bedtime.   insulin glargine, 1 Unit Dial, (TOUJEO SOLOSTAR) 300 UNIT/ML Solostar Pen Inject 50 Units into the skin at bedtime.   lamoTRIgine (LAMICTAL) 150 MG tablet Take 1 tablet (150 mg total) by mouth at bedtime.   levothyroxine (SYNTHROID) 112 MCG tablet Take 112 mcg by mouth daily.   OVER THE COUNTER MEDICATION Apply 1 application  topically daily as needed (pain). CBD salve (Patient not taking: Reported on 06/11/2023)   pantoprazole (PROTONIX) 40 MG tablet TAKE 1 TABLET BY MOUTH AT BEDTIME   rOPINIRole (REQUIP) 3 MG tablet TAKE ONE TABLET BY MOUTH EVERY MORNING, ,NOON AND AT BEDTIME. (PRT PT MORNING,EVENING,BEDTIME) (Patient taking differently: Take 3-6 mg by mouth See admin instructions. Take 1 tablet (3mg ) every morning and 2 tablets (6mg ) at bedtime.)   venlafaxine XR (EFFEXOR-XR) 150 MG 24 hr capsule TAKE TWO (2) CAPSULES BY MOUTH AT BEDTIME   No facility-administered encounter medications on file as of 06/11/2023.    ALLERGIES: Allergies  Allergen Reactions   Amoxil [Amoxicillin] Anaphylaxis   Hydrocodone Anaphylaxis   Percocet [Oxycodone-Acetaminophen] Other (See Comments)  Causes chest pain /tightness. Pressure pain around her ribs.   Depacon [Valproic Acid] Other (See Comments)    Causes falls    Prednisone Itching   Roxicodone [Oxycodone] Other (See Comments)    Chest pain, tightness    VACCINATION STATUS: Immunization History  Administered Date(s) Administered    Astrazeneca Covid-19 Vaccine, Pf, 0.5 Ml Non [place]  01/29/2023   Influenza,inj,Quad PF,6+ Mos 01/20/2018    Influenza-Unspecified 01/29/2023   Janssen (J&J) SARS-COV-2 Vaccination 03/17/2020   Pneumococcal Polysaccharide-23 01/30/2015   Td,absorbed, Preservative Free, Adult Use, Lf Unspecified 07/31/1999   Tdap 07/31/1999, 04/08/2020    Diabetes She presents for her follow-up diabetic visit. She has type 2 diabetes mellitus. Onset time: She was diagnosed at approximate age of 47 years.  This patient was previously seen in this clinic for diabetic management, last visit was in 2021. Her disease course has been improving. There are no hypoglycemic associated symptoms. Pertinent negatives for hypoglycemia include no confusion, headaches, pallor or seizures. Associated symptoms include fatigue. Pertinent negatives for diabetes include no blurred vision, no chest pain, no polydipsia, no polyphagia and no polyuria. There are no hypoglycemic complications. Symptoms are improving. Diabetic complications include heart disease and nephropathy. Risk factors for coronary artery disease include dyslipidemia, diabetes mellitus, hypertension, obesity, family history, post-menopausal, sedentary lifestyle and tobacco exposure. Current diabetic treatment includes insulin injections. Her weight is decreasing steadily. She is following a generally unhealthy diet. When asked about meal planning, she reported none. She has had a previous visit with a dietitian. She rarely participates in exercise. Her home blood glucose trend is decreasing steadily. Her breakfast blood glucose range is generally 180-200 mg/dl. Her lunch blood glucose range is generally 180-200 mg/dl. Her dinner blood glucose range is generally 180-200 mg/dl. Her bedtime blood glucose range is generally 180-200 mg/dl. Her overall blood glucose range is 180-200 mg/dl. (She presents with improved glycemic profile.  Her CGM AGP shows 50% time range, 40% level 1 hyperglycemia, 10% level 2 hyperglycemia.  She has no hypoglycemia.  Her recent A1c was 7.7%, progressively  improving from 9.6%.  Average blood glucose for the most recent 2 weeks is 184 mg per DL.) An ACE inhibitor/angiotensin II receptor blocker is being taken. Eye exam is current.  Hypertension This is a chronic problem. Pertinent negatives include no blurred vision, chest pain, headaches, palpitations or shortness of breath. Risk factors for coronary artery disease include dyslipidemia, diabetes mellitus, family history, obesity, post-menopausal state, smoking/tobacco exposure and sedentary lifestyle. Past treatments include angiotensin blockers. Hypertensive end-organ damage includes CAD/MI. Identifiable causes of hypertension include chronic renal disease.    Review of Systems  Constitutional:  Positive for fatigue. Negative for chills, fever and unexpected weight change.  HENT:  Negative for trouble swallowing and voice change.   Eyes:  Negative for blurred vision and visual disturbance.  Respiratory:  Negative for cough, shortness of breath and wheezing.   Cardiovascular:  Negative for chest pain, palpitations and leg swelling.  Gastrointestinal:  Negative for diarrhea, nausea and vomiting.  Endocrine: Negative for cold intolerance, heat intolerance, polydipsia, polyphagia and polyuria.  Musculoskeletal:  Negative for arthralgias and myalgias.  Skin:  Negative for color change, pallor, rash and wound.  Neurological:  Negative for seizures and headaches.  Psychiatric/Behavioral:  Negative for confusion and suicidal ideas.     Objective:       06/11/2023   10:51 AM 04/14/2023    1:51 PM 04/14/2023   10:11 AM  Vitals with BMI  Height 5\' 0"   Weight 258 lbs 13 oz    BMI 50.54    Systolic 106 85 96  Diastolic 62 45 49  Pulse 52 74 79    BP 106/62   Pulse (!) 52   Ht 5' (1.524 m)   Wt 258 lb 12.8 oz (117.4 kg)   BMI 50.54 kg/m   Wt Readings from Last 3 Encounters:  06/11/23 258 lb 12.8 oz (117.4 kg)  04/14/23 270 lb 15.1 oz (122.9 kg)  04/10/23 263 lb 0.1 oz (119.3 kg)        CMP ( most recent) CMP     Component Value Date/Time   NA 138 04/22/2023 0000   K 5.0 04/22/2023 0000   CL 99 04/22/2023 0000   CO2 26 (A) 04/22/2023 0000   GLUCOSE 186 (H) 04/14/2023 0449   BUN 24 (A) 04/22/2023 0000   CREATININE 1.5 (A) 04/22/2023 0000   CREATININE 1.36 (H) 04/14/2023 0449   CREATININE 1.10 (H) 03/05/2019 1221   CALCIUM 9.4 04/22/2023 0000   PROT 8.2 (H) 04/13/2023 1846   PROT 6.9 01/11/2021 1014   ALBUMIN 3.8 04/22/2023 0000   ALBUMIN 4.1 01/11/2021 1014   AST 11 (A) 04/22/2023 0000   ALT 12 04/22/2023 0000   ALKPHOS 81 04/22/2023 0000   BILITOT 0.7 04/13/2023 1846   BILITOT 0.2 01/11/2021 1014   GFRNONAA 42 (L) 04/14/2023 0449   GFRNONAA 23 10/09/2022 0000   GFRNONAA 53 (L) 03/05/2019 1221   GFRAA 56 (L) 04/23/2019 1511   GFRAA 61 03/05/2019 1221     Diabetic Labs (most recent): Lab Results  Component Value Date   HGBA1C 7.7 04/22/2023   HGBA1C 7.8 (A) 02/04/2023   HGBA1C 9.6 10/09/2022   MICROALBUR 0.9 01/29/2018   MICROALBUR 1.5 07/10/2016     Lipid Panel ( most recent) Lipid Panel     Component Value Date/Time   CHOL 173 04/22/2023 0000   TRIG 147 04/22/2023 0000   HDL 41 04/22/2023 0000   CHOLHDL 3.8 03/05/2019 1221   VLDL 31 (H) 07/10/2016 0934   LDLCALC 106 04/22/2023 0000   LDLCALC 152 (H) 03/05/2019 1221      Lab Results  Component Value Date   TSH 1.79 04/22/2023   TSH 1.304 04/14/2023   TSH 1.73 10/09/2022   TSH 1.49 03/05/2019   TSH 2.671 10/30/2018   TSH 3.05 04/24/2018   TSH 2.16 01/29/2018   TSH 3.71 07/11/2017   TSH 2.13 07/10/2016   TSH 1.766 05/12/2015   FREET4 1.2 03/05/2019   FREET4 1.05 10/30/2018   FREET4 1.2 01/29/2018   FREET4 1.4 07/11/2017   FREET4 1.0 07/10/2016   FREET4 1.22 05/12/2015       Assessment & Plan:   1. DM type 2 causing vascular disease  - CERENITI CURB has currently uncontrolled symptomatic type 2 DM since  73 years of age.  She presents with improved glycemic  profile.  Her CGM AGP shows 50% time range, 40% level 1 hyperglycemia, 10% level 2 hyperglycemia.  She has no hypoglycemia.  Her recent A1c was 7.7%, progressively improving from 9.6%.  Average blood glucose for the most recent 2 weeks is 184 mg per DL.  - Recent labs reviewed. - I had a long discussion with her about the possible risk factors and  the pathology behind its diabetes and its complications. -her diabetes is complicated by coronary artery disease, CKD, obesity/sedentary life, polypharmacy, and she remains at a high risk for more acute and chronic complications which  include CAD, CVA, CKD, retinopathy, and neuropathy. These are all discussed in detail with her.  - I discussed all available options of managing her diabetes including de-escalation of medications. I have counseled her on Food as Medicine by adopting a Whole Food , Plant Predominant  ( WFPP) nutrition as recommended by [employer] of Lifestyle Medicine. Patient is encouraged to switch to  unprocessed or minimally processed  complex starch, adequate protein intake (mainly plant source), minimal liquid fat, plenty of fruits, and vegetables. -  she is advised to stick to a routine mealtimes to eat 3 complete meals a day and snack only when necessary ( to snack only to correct hypoglycemia BG <70 day time or <100 at night).   -Her engagement with lifestyle medicine nutrition is suboptimal, however she acknowledges that there is a room for improvement in her food and drink choices. - Suggestion is made for her to avoid simple carbohydrates  from her diet including Cakes, Sweet Desserts, Ice Cream, Soda (diet and regular), Sweet Tea, Candies, Chips, Cookies, Store Bought Juices, Alcohol , Artificial Sweeteners,  Coffee Creamer, and "Sugar-free" Products, Lemonade. This will help patient to have more stable blood glucose profile and potentially avoid unintended weight gain.  The following Lifestyle Medicine recommendations  according to American College of Lifestyle Medicine  [facility]) were discussed and and offered to patient and she  agrees to start the journey:  A. Whole Foods, Plant-Based Nutrition comprising of fruits and vegetables, plant-based proteins, whole-grain carbohydrates was discussed in detail with the patient.   A list for source of those nutrients were also provided to the patient.  Patient will use only water or unsweetened tea for hydration. B.  The need to stay away from risky substances including alcohol, smoking; obtaining 7 to 9 hours of restorative sleep, at least 150 minutes of moderate intensity exercise weekly, the importance of healthy social connections,  and stress management techniques were discussed. C.  A full color page of  Calorie density of various food groups per pound showing examples of each food groups was provided to the patient.  - she will be scheduled with Norm Salt, RDN, CDE for individualized diabetes education.  - I have approached her with the following individualized plan to manage  her diabetes and patient agrees:   -In light of her tight nocturnal glycemic profile, she will not tolerate any higher dose of Toujeo.  She is advised to continue Toujeo 50 units nightly,  associated with utilizing her CGM continuously.   - She is getting Jardiance from her nephrologist,  10 mg p.o. daily at breakfast. -She was taken off of glipizide recently.  She may benefit from GLP-1 receptor agonist.  I discussed and prescribed Mounjaro 2.5 mg subcutaneously weekly.  Side effects and precautions discussed with her.  This medication will be advanced as she tolerates.  - she is encouraged to call clinic for blood glucose levels less than 70 or above 200 mg /dl weekly average.  - Specific targets for  A1c;  LDL, HDL,  and Triglycerides were discussed with the patient.  2) Blood Pressure /Hypertension:   -Her blood pressure is controlled to target.   she is advised to continue her  current medications including losartan 50 mg p.o. daily with breakfast . 3) Lipids/Hyperlipidemia:   Review of her recent lipid panel showed uncontrolled lipid panel with LDL at 106.    she  is advised to continue    Repatha 140 mg subcutaneously every other week.  4)  Weight/Diet:  Body mass index is 50.54 kg/m.  -   clearly complicating her diabetes care.   she is  a candidate for weight loss. I discussed with her the fact that loss of 5 - 10% of her  current body weight will have the most impact on her diabetes management.  The above detailed  ACLM recommendations for nutrition, exercise, sleep, social life, avoidance of risky substances, the need for restorative sleep   information will also detailed on discharge instructions.  5) hypothyroidism: Longstanding diagnosis.  No recent thyroid function test to review.  She is advised to continue levothyroxine 112 mcg p.o. daily before breakfast.    - We discussed about the correct intake of her thyroid hormone, on empty stomach at fasting, with water, separated by at least 30 minutes from breakfast and other medications,  and separated by more than 4 hours from calcium, iron, multivitamins, acid reflux medications (PPIs). -Patient is made aware of the fact that thyroid hormone replacement is needed for life, dose to be adjusted by periodic monitoring of thyroid function tests.   6) vitamin D deficiency: She is status post treatment with vitamin D 2 50,000 units.  I would stay she is advised to switch to vitamin D3 2000 units daily for maintenance.   7) Chronic Care/Health Maintenance:  -she  is on ARB  medications and  is encouraged to initiate and continue to follow up with Ophthalmology, Dentist,  Podiatrist at least yearly or according to recommendations, and advised to   stay away from smoking. I have recommended yearly flu vaccine and pneumonia vaccine at least every 5 years; moderate intensity exercise for up to 150 minutes weekly; and  sleep  for 7- 9 hours a day.  - she is  advised to maintain close follow up with [name], MD for primary care needs, as well as her other providers for optimal and coordinated care.  I spent  42  minutes in the care of the patient today including review of labs from CMP, Lipids, Thyroid Function, Hematology (current and previous including abstractions from other facilities); face-to-face time discussing  her blood glucose readings/logs, discussing hypoglycemia and hyperglycemia episodes and symptoms, medications doses, her options of short and long term treatment based on the latest standards of care / guidelines;  discussion about incorporating lifestyle medicine;  and documenting the encounter. Risk reduction counseling performed per USPSTF guidelines to reduce  obesity and cardiovascular risk factors.     Please refer to Patient Instructions for Blood Glucose Monitoring and Insulin/Medications Dosing Guide"  in media tab for additional information. Please  also refer to " Patient Self Inventory" in the Media  tab for reviewed elements of pertinent patient history.  Sherry Ruffing participated in the discussions, expressed understanding, and voiced agreement with the above plans.  All questions were answered to her satisfaction. she is encouraged to contact clinic should she have any questions or concerns prior to her return visit.    Follow up plan: - Return in about 4 months (around 10/09/2023) for Bring Meter/CGM Device/Logs- A1c in Office.  Marquis Lunch, MD [facility] [address] [place], [place] [address] Phone: [phone]  Fax: [phone]    06/11/2023, 2:42 PM  This note was partially dictated with voice recognition software. Similar sounding words can be transcribed inadequately or may not  be corrected upon review.

## 2023-06-11 NOTE — Patient Instructions (Signed)

## 2023-06-12 DIAGNOSIS — N1832 Chronic kidney disease, stage 3b: Secondary | ICD-10-CM | POA: Diagnosis not present

## 2023-06-12 DIAGNOSIS — E11621 Type 2 diabetes mellitus with foot ulcer: Secondary | ICD-10-CM | POA: Diagnosis not present

## 2023-06-12 DIAGNOSIS — I509 Heart failure, unspecified: Secondary | ICD-10-CM | POA: Diagnosis not present

## 2023-06-12 DIAGNOSIS — J449 Chronic obstructive pulmonary disease, unspecified: Secondary | ICD-10-CM | POA: Diagnosis not present

## 2023-06-12 DIAGNOSIS — I130 Hypertensive heart and chronic kidney disease with heart failure and stage 1 through stage 4 chronic kidney disease, or unspecified chronic kidney disease: Secondary | ICD-10-CM | POA: Diagnosis not present

## 2023-06-12 DIAGNOSIS — L97521 Non-pressure chronic ulcer of other part of left foot limited to breakdown of skin: Secondary | ICD-10-CM | POA: Diagnosis not present

## 2023-06-13 DIAGNOSIS — E11621 Type 2 diabetes mellitus with foot ulcer: Secondary | ICD-10-CM | POA: Diagnosis not present

## 2023-06-13 DIAGNOSIS — L97521 Non-pressure chronic ulcer of other part of left foot limited to breakdown of skin: Secondary | ICD-10-CM | POA: Diagnosis not present

## 2023-06-13 DIAGNOSIS — Z87891 Personal history of nicotine dependence: Secondary | ICD-10-CM | POA: Diagnosis not present

## 2023-06-13 DIAGNOSIS — J449 Chronic obstructive pulmonary disease, unspecified: Secondary | ICD-10-CM | POA: Diagnosis not present

## 2023-06-13 DIAGNOSIS — F319 Bipolar disorder, unspecified: Secondary | ICD-10-CM | POA: Diagnosis not present

## 2023-06-13 DIAGNOSIS — F419 Anxiety disorder, unspecified: Secondary | ICD-10-CM | POA: Diagnosis not present

## 2023-06-13 DIAGNOSIS — I2510 Atherosclerotic heart disease of native coronary artery without angina pectoris: Secondary | ICD-10-CM | POA: Diagnosis not present

## 2023-06-13 DIAGNOSIS — Z8673 Personal history of transient ischemic attack (TIA), and cerebral infarction without residual deficits: Secondary | ICD-10-CM | POA: Diagnosis not present

## 2023-06-13 DIAGNOSIS — I509 Heart failure, unspecified: Secondary | ICD-10-CM | POA: Diagnosis not present

## 2023-06-13 DIAGNOSIS — Z604 Social exclusion and rejection: Secondary | ICD-10-CM | POA: Diagnosis not present

## 2023-06-13 DIAGNOSIS — D631 Anemia in chronic kidney disease: Secondary | ICD-10-CM | POA: Diagnosis not present

## 2023-06-13 DIAGNOSIS — Z9981 Dependence on supplemental oxygen: Secondary | ICD-10-CM | POA: Diagnosis not present

## 2023-06-13 DIAGNOSIS — I130 Hypertensive heart and chronic kidney disease with heart failure and stage 1 through stage 4 chronic kidney disease, or unspecified chronic kidney disease: Secondary | ICD-10-CM | POA: Diagnosis not present

## 2023-06-13 DIAGNOSIS — E1142 Type 2 diabetes mellitus with diabetic polyneuropathy: Secondary | ICD-10-CM | POA: Diagnosis not present

## 2023-06-13 DIAGNOSIS — Z794 Long term (current) use of insulin: Secondary | ICD-10-CM | POA: Diagnosis not present

## 2023-06-13 DIAGNOSIS — E66813 Obesity, class 3: Secondary | ICD-10-CM | POA: Diagnosis not present

## 2023-06-13 DIAGNOSIS — D509 Iron deficiency anemia, unspecified: Secondary | ICD-10-CM | POA: Diagnosis not present

## 2023-06-13 DIAGNOSIS — N1832 Chronic kidney disease, stage 3b: Secondary | ICD-10-CM | POA: Diagnosis not present

## 2023-06-13 DIAGNOSIS — E039 Hypothyroidism, unspecified: Secondary | ICD-10-CM | POA: Diagnosis not present

## 2023-06-13 DIAGNOSIS — K760 Fatty (change of) liver, not elsewhere classified: Secondary | ICD-10-CM | POA: Diagnosis not present

## 2023-06-13 DIAGNOSIS — Z7984 Long term (current) use of oral hypoglycemic drugs: Secondary | ICD-10-CM | POA: Diagnosis not present

## 2023-06-13 DIAGNOSIS — Z6841 Body Mass Index (BMI) 40.0 and over, adult: Secondary | ICD-10-CM | POA: Diagnosis not present

## 2023-06-13 DIAGNOSIS — M109 Gout, unspecified: Secondary | ICD-10-CM | POA: Diagnosis not present

## 2023-06-13 DIAGNOSIS — Z7982 Long term (current) use of aspirin: Secondary | ICD-10-CM | POA: Diagnosis not present

## 2023-06-13 DIAGNOSIS — D1724 Benign lipomatous neoplasm of skin and subcutaneous tissue of left leg: Secondary | ICD-10-CM | POA: Diagnosis not present

## 2023-06-16 DIAGNOSIS — I509 Heart failure, unspecified: Secondary | ICD-10-CM | POA: Diagnosis not present

## 2023-06-16 DIAGNOSIS — N1832 Chronic kidney disease, stage 3b: Secondary | ICD-10-CM | POA: Diagnosis not present

## 2023-06-16 DIAGNOSIS — I130 Hypertensive heart and chronic kidney disease with heart failure and stage 1 through stage 4 chronic kidney disease, or unspecified chronic kidney disease: Secondary | ICD-10-CM | POA: Diagnosis not present

## 2023-06-16 DIAGNOSIS — E11621 Type 2 diabetes mellitus with foot ulcer: Secondary | ICD-10-CM | POA: Diagnosis not present

## 2023-06-16 DIAGNOSIS — L97521 Non-pressure chronic ulcer of other part of left foot limited to breakdown of skin: Secondary | ICD-10-CM | POA: Diagnosis not present

## 2023-06-16 DIAGNOSIS — J449 Chronic obstructive pulmonary disease, unspecified: Secondary | ICD-10-CM | POA: Diagnosis not present

## 2023-06-17 ENCOUNTER — Other Ambulatory Visit: Payer: Self-pay | Admitting: Pharmacist

## 2023-06-17 DIAGNOSIS — I25110 Atherosclerotic heart disease of native coronary artery with unstable angina pectoris: Secondary | ICD-10-CM

## 2023-06-17 DIAGNOSIS — E782 Mixed hyperlipidemia: Secondary | ICD-10-CM

## 2023-06-17 MED ORDER — REPATHA SURECLICK 140 MG/ML ~~LOC~~ SOAJ: 140.0000 mg | SUBCUTANEOUS | 1 refills | Status: DC

## 2023-06-19 DIAGNOSIS — E11621 Type 2 diabetes mellitus with foot ulcer: Secondary | ICD-10-CM | POA: Diagnosis not present

## 2023-06-19 DIAGNOSIS — I130 Hypertensive heart and chronic kidney disease with heart failure and stage 1 through stage 4 chronic kidney disease, or unspecified chronic kidney disease: Secondary | ICD-10-CM | POA: Diagnosis not present

## 2023-06-19 DIAGNOSIS — J449 Chronic obstructive pulmonary disease, unspecified: Secondary | ICD-10-CM | POA: Diagnosis not present

## 2023-06-19 DIAGNOSIS — L97521 Non-pressure chronic ulcer of other part of left foot limited to breakdown of skin: Secondary | ICD-10-CM | POA: Diagnosis not present

## 2023-06-19 DIAGNOSIS — I509 Heart failure, unspecified: Secondary | ICD-10-CM | POA: Diagnosis not present

## 2023-06-19 DIAGNOSIS — N1832 Chronic kidney disease, stage 3b: Secondary | ICD-10-CM | POA: Diagnosis not present

## 2023-06-23 ENCOUNTER — Ambulatory Visit (HOSPITAL_BASED_OUTPATIENT_CLINIC_OR_DEPARTMENT_OTHER): Payer: Medicare Other | Admitting: Pulmonary Disease

## 2023-06-23 DIAGNOSIS — I509 Heart failure, unspecified: Secondary | ICD-10-CM | POA: Diagnosis not present

## 2023-06-23 DIAGNOSIS — I130 Hypertensive heart and chronic kidney disease with heart failure and stage 1 through stage 4 chronic kidney disease, or unspecified chronic kidney disease: Secondary | ICD-10-CM | POA: Diagnosis not present

## 2023-06-23 DIAGNOSIS — J449 Chronic obstructive pulmonary disease, unspecified: Secondary | ICD-10-CM | POA: Diagnosis not present

## 2023-06-23 DIAGNOSIS — E11621 Type 2 diabetes mellitus with foot ulcer: Secondary | ICD-10-CM | POA: Diagnosis not present

## 2023-06-23 DIAGNOSIS — N1832 Chronic kidney disease, stage 3b: Secondary | ICD-10-CM | POA: Diagnosis not present

## 2023-06-23 DIAGNOSIS — L97521 Non-pressure chronic ulcer of other part of left foot limited to breakdown of skin: Secondary | ICD-10-CM | POA: Diagnosis not present

## 2023-06-25 DIAGNOSIS — Z7982 Long term (current) use of aspirin: Secondary | ICD-10-CM | POA: Diagnosis not present

## 2023-06-25 DIAGNOSIS — Z79899 Other long term (current) drug therapy: Secondary | ICD-10-CM | POA: Diagnosis not present

## 2023-06-25 DIAGNOSIS — J449 Chronic obstructive pulmonary disease, unspecified: Secondary | ICD-10-CM | POA: Diagnosis not present

## 2023-06-25 DIAGNOSIS — I509 Heart failure, unspecified: Secondary | ICD-10-CM | POA: Diagnosis not present

## 2023-06-25 DIAGNOSIS — E1140 Type 2 diabetes mellitus with diabetic neuropathy, unspecified: Secondary | ICD-10-CM | POA: Diagnosis not present

## 2023-06-25 DIAGNOSIS — E039 Hypothyroidism, unspecified: Secondary | ICD-10-CM | POA: Diagnosis not present

## 2023-06-25 DIAGNOSIS — L97521 Non-pressure chronic ulcer of other part of left foot limited to breakdown of skin: Secondary | ICD-10-CM | POA: Diagnosis not present

## 2023-06-25 DIAGNOSIS — E11621 Type 2 diabetes mellitus with foot ulcer: Secondary | ICD-10-CM | POA: Diagnosis not present

## 2023-06-25 DIAGNOSIS — Z87891 Personal history of nicotine dependence: Secondary | ICD-10-CM | POA: Diagnosis not present

## 2023-06-25 DIAGNOSIS — I110 Hypertensive heart disease with heart failure: Secondary | ICD-10-CM | POA: Diagnosis not present

## 2023-06-27 DIAGNOSIS — I130 Hypertensive heart and chronic kidney disease with heart failure and stage 1 through stage 4 chronic kidney disease, or unspecified chronic kidney disease: Secondary | ICD-10-CM | POA: Diagnosis not present

## 2023-06-27 DIAGNOSIS — E11621 Type 2 diabetes mellitus with foot ulcer: Secondary | ICD-10-CM | POA: Diagnosis not present

## 2023-06-27 DIAGNOSIS — N1832 Chronic kidney disease, stage 3b: Secondary | ICD-10-CM | POA: Diagnosis not present

## 2023-06-27 DIAGNOSIS — J449 Chronic obstructive pulmonary disease, unspecified: Secondary | ICD-10-CM | POA: Diagnosis not present

## 2023-06-27 DIAGNOSIS — I509 Heart failure, unspecified: Secondary | ICD-10-CM | POA: Diagnosis not present

## 2023-06-27 DIAGNOSIS — L97521 Non-pressure chronic ulcer of other part of left foot limited to breakdown of skin: Secondary | ICD-10-CM | POA: Diagnosis not present

## 2023-06-30 DIAGNOSIS — L97521 Non-pressure chronic ulcer of other part of left foot limited to breakdown of skin: Secondary | ICD-10-CM | POA: Diagnosis not present

## 2023-06-30 DIAGNOSIS — J449 Chronic obstructive pulmonary disease, unspecified: Secondary | ICD-10-CM | POA: Diagnosis not present

## 2023-06-30 DIAGNOSIS — N1832 Chronic kidney disease, stage 3b: Secondary | ICD-10-CM | POA: Diagnosis not present

## 2023-06-30 DIAGNOSIS — I509 Heart failure, unspecified: Secondary | ICD-10-CM | POA: Diagnosis not present

## 2023-06-30 DIAGNOSIS — I130 Hypertensive heart and chronic kidney disease with heart failure and stage 1 through stage 4 chronic kidney disease, or unspecified chronic kidney disease: Secondary | ICD-10-CM | POA: Diagnosis not present

## 2023-06-30 DIAGNOSIS — E11621 Type 2 diabetes mellitus with foot ulcer: Secondary | ICD-10-CM | POA: Diagnosis not present

## 2023-07-01 ENCOUNTER — Ambulatory Visit (INDEPENDENT_AMBULATORY_CARE_PROVIDER_SITE_OTHER): Payer: Medicare Other | Admitting: Gastroenterology

## 2023-07-01 ENCOUNTER — Ambulatory Visit: Payer: Medicare Other | Admitting: Adult Health

## 2023-07-02 ENCOUNTER — Telehealth: Payer: Self-pay

## 2023-07-02 ENCOUNTER — Other Ambulatory Visit: Payer: Self-pay | Admitting: "Endocrinology

## 2023-07-02 DIAGNOSIS — L97521 Non-pressure chronic ulcer of other part of left foot limited to breakdown of skin: Secondary | ICD-10-CM | POA: Diagnosis not present

## 2023-07-02 DIAGNOSIS — I509 Heart failure, unspecified: Secondary | ICD-10-CM | POA: Diagnosis not present

## 2023-07-02 DIAGNOSIS — E1140 Type 2 diabetes mellitus with diabetic neuropathy, unspecified: Secondary | ICD-10-CM | POA: Diagnosis not present

## 2023-07-02 DIAGNOSIS — J449 Chronic obstructive pulmonary disease, unspecified: Secondary | ICD-10-CM | POA: Diagnosis not present

## 2023-07-02 DIAGNOSIS — E11621 Type 2 diabetes mellitus with foot ulcer: Secondary | ICD-10-CM | POA: Diagnosis not present

## 2023-07-02 DIAGNOSIS — I110 Hypertensive heart disease with heart failure: Secondary | ICD-10-CM | POA: Diagnosis not present

## 2023-07-02 MED ORDER — TRULICITY 0.75 MG/0.5ML ~~LOC~~ SOAJ: 0.7500 mg | SUBCUTANEOUS | 0 refills | Status: DC

## 2023-07-02 NOTE — Telephone Encounter (Signed)
 FYI- Pt called stating she has not started Mounjaro. States she is experiencing difficulty in finding discounts for the [facility] and has also looked for Ozempic. States both will still cost over 100.00 with discount cards.

## 2023-07-02 NOTE — Telephone Encounter (Signed)
 Spoke with pt advising her she can try Trulicity 0.75mg  weekly per Dr.Nida. Pt voiced understanding. Rx for Trulicity 0.75mg  SQ weekly sent to pharmacy by Dr.Nida.

## 2023-07-03 ENCOUNTER — Encounter: Payer: Self-pay | Admitting: Nurse Practitioner

## 2023-07-03 ENCOUNTER — Ambulatory Visit: Admitting: Nurse Practitioner

## 2023-07-03 VITALS — BP 128/76 | HR 89 | Ht 60.0 in | Wt 259.0 lb

## 2023-07-03 DIAGNOSIS — G4733 Obstructive sleep apnea (adult) (pediatric): Secondary | ICD-10-CM | POA: Diagnosis not present

## 2023-07-03 DIAGNOSIS — R0609 Other forms of dyspnea: Secondary | ICD-10-CM | POA: Diagnosis not present

## 2023-07-03 NOTE — Progress Notes (Signed)
 @Patient  ID: Norma [name], female    DOB: Oct 06, 1950, 73 y.o.   MRN: [id number]  Chief Complaint  Patient presents with   Follow-up    Breathing has been some better since her last visit. Still wheezing. She is doing well with CPAP.     Referring provider: [name], MD  HPI: 73 year female, former smoker followed for OSA on CPAP and nocturnal hypoxia. She is a patient of Dr. [name] and last seen in office 12/18/2022. Past medical history significant for DM, HTN, CKD, spinal stenosis, bilateral TKR, CABG 2019, HFpEF, RLS.   TEST/EVENTS:  05/2016 CT chest: no ILD 04/2018 PFT: FVC 93, FEV1 102, ratio 84, TLC 100, DLCO 72 09/2019 NPSG: AHI 9.2/h, SpO2 low 75% 10/2019 CPAP titration >> CPAP 14 cmH2O with 2 lpm O2 06/2021 ONO on CPAP/ra >> desaturation for 4 hr  12/18/2022: OV with Dr. [name]. Hx of OSA maintained on CPAP. DME is [employer]. Started on oxygen after sleep study in 2021; unclear cause of nocturnal hypoxia. Lung function appears okay. No evidence of ILD or pulm HTN. Replacement auto CPAP 10-15 cmH2O. Increased DOE; resumed torsemide but has difficulties with dehydration with this. Weight stable. Breathing impacted by fluid retention.   07/03/2023: Today - follow up Patient presents today for follow-up.  She has been feeling better since she was here last from a breathing standpoint.  Still gets short winded with most activities and is relatively sedentary, which is baseline for her.  No significant cough or wheezing. No increased swelling or weight gain. She is sleeping with her CPAP every night.  Working well for her.  Feels well rested with use.  She does notice that it leaks sometimes.  No issues with drowsy driving, morning headaches or excessive daytime sleepiness.  06/02/2023-07/01/2023 CPAP 10-15 cmH2O 26/30 days; 83% greater than 4 hours; average use 6 hours 42 minutes Pressure 95th 12.2 Leaks 95th 57.8 AHI 6.2   Allergies  Allergen Reactions   Amoxil [Amoxicillin]  Anaphylaxis   Hydrocodone Anaphylaxis   Percocet [Oxycodone-Acetaminophen] Other (See Comments)    Causes chest pain /tightness. Pressure pain around her ribs.   Depacon [Valproic Acid] Other (See Comments)    Causes falls    Prednisone Itching   Roxicodone [Oxycodone] Other (See Comments)    Chest pain, tightness    Immunization History  Administered Date(s) Administered    Astrazeneca Covid-19 Vaccine, Pf, 0.5 Ml Non [place]  01/29/2023   Influenza,inj,Quad PF,6+ Mos 01/20/2018   Influenza-Unspecified 01/29/2023   Janssen (J&J) SARS-COV-2 Vaccination 03/17/2020   Pneumococcal Polysaccharide-23 01/30/2015   Td,absorbed, Preservative Free, Adult Use, Lf Unspecified 07/31/1999   Tdap 07/31/1999, 04/08/2020    Past Medical History:  Diagnosis Date   Anemia    Anxiety    Bipolar disorder (HCC)    Bulging lumbar disc    L3-4   Chronic daily headache    Chronic low back pain 09/20/2014   CKD (chronic kidney disease)    stage 3   COPD (chronic obstructive pulmonary disease) (HCC)    Coronary artery disease    CABG 2019   Degenerative arthritis    Depression    Diabetes mellitus, type II (HCC)    Diabetic neuropathy (HCC) 09/16/2018   DM type 2 with diabetic peripheral neuropathy (HCC) 09/20/2014   Dyslipidemia    Dyspnea    Family history of adverse reaction to anesthesia    father had reaction to anesthesia medication per patient "they don't use it anymore"-  unsure of reaction   Fatty liver    per pt report   Gastroparesis    GERD (gastroesophageal reflux disease)    Heart murmur    History of hiatal hernia    pt states she no longer has this   Hypertension    Hypothyroidism    IBS (irritable bowel syndrome)    LBBB (left bundle branch block)    Memory difficulty 09/20/2014   Morbid obesity (HCC)    Myocardial infarction (HCC)    per pt- prior to CABG   Neuropathy    Obstructive sleep apnea on CPAP    Renal insufficiency    Restless legs syndrome (RLS) 09/17/2012    Stroke (cerebrum) (HCC) 05/16/2017   Left parietal   Urticaria     Tobacco History: Social History   Tobacco Use  Smoking Status Former   Current packs/day: 0.00   Types: Cigarettes   Quit date: 02/04/1971   Years since quitting: 52.4  Smokeless Tobacco Never  Tobacco Comments   smoked 2 cigarettes a day   Counseling given: Not Answered Tobacco comments: smoked 2 cigarettes a day   Outpatient Medications Prior to Visit  Medication Sig Dispense Refill   acetaminophen (TYLENOL) 500 MG tablet Take 1,000 mg by mouth daily as needed for moderate pain (pain score 4-6), fever or headache.     ALPRAZolam (XANAX) 0.25 MG tablet Take 1 tablet (0.25 mg total) by mouth at bedtime as needed for anxiety or sleep. 30 tablet 2   ARIPiprazole (ABILIFY) 5 MG tablet Take 1 tablet (5 mg total) by mouth daily. 30 tablet 2   aspirin EC 81 MG tablet Take 1 tablet (81 mg total) by mouth daily. 90 tablet 3   azelastine (ASTELIN) 0.1 % nasal spray Place 1 spray into both nostrils 2 (two) times daily as needed for allergies.     buPROPion (WELLBUTRIN XL) 150 MG 24 hr tablet Take 1 tablet (150 mg total) by mouth every morning. 30 tablet 2   carvedilol (COREG) 6.25 MG tablet Take 1 tablet (6.25 mg total) by mouth 2 (two) times daily with a meal. 180 tablet 2   Cholecalciferol (VITAMIN D3) 50 MCG (2000 UT) capsule Take 1 capsule (2,000 Units total) by mouth daily with lunch. 90 capsule 3   diclofenac Sodium (VOLTAREN) 1 % GEL Apply 2 g topically 4 (four) times daily as needed (pain).     Dulaglutide (TRULICITY) 0.75 MG/0.5ML SOAJ Inject 0.75 mg into the skin once a week. 6 mL 0   empagliflozin (JARDIANCE) 10 MG TABS tablet Take 5 mg daily (Patient taking differently: 10 mg.)     Evolocumab (REPATHA SURECLICK) 140 MG/ML SOAJ Inject 140 mg into the skin every 14 (fourteen) days. 6 mL 1   gabapentin (NEURONTIN) 400 MG capsule Take 400-800 mg by mouth See admin instructions. Take 1 capsule (400mg ) every morning  and 2 capsules (800mg ) at bedtime.     insulin glargine, 1 Unit Dial, (TOUJEO SOLOSTAR) 300 UNIT/ML Solostar Pen Inject 50 Units into the skin at bedtime. 13.5 mL 2   lamoTRIgine (LAMICTAL) 150 MG tablet Take 1 tablet (150 mg total) by mouth at bedtime. 30 tablet 2   levothyroxine (SYNTHROID) 112 MCG tablet Take 112 mcg by mouth daily.     OVER THE COUNTER MEDICATION Apply 1 application  topically daily as needed (pain). CBD salve     pantoprazole (PROTONIX) 40 MG tablet TAKE 1 TABLET BY MOUTH AT BEDTIME 90 tablet 2   rOPINIRole (REQUIP)  3 MG tablet TAKE ONE TABLET BY MOUTH EVERY MORNING, ,NOON AND AT BEDTIME. (PRT PT MORNING,EVENING,BEDTIME) (Patient taking differently: Take 3-6 mg by mouth See admin instructions. Take 1 tablet (3mg ) every morning and 2 tablets (6mg ) at bedtime.) 270 tablet 3   venlafaxine XR (EFFEXOR-XR) 150 MG 24 hr capsule TAKE TWO (2) CAPSULES BY MOUTH AT BEDTIME 60 capsule 2   No facility-administered medications prior to visit.     Review of Systems:   Constitutional: No weight loss or gain, night sweats, fevers, chills, or lassitude. +fatigue (baseline) HEENT: No headaches, difficulty swallowing, tooth/dental problems, or sore throat. No sneezing, itching, ear ache, nasal congestion, or post nasal drip CV:  No chest pain, orthopnea, PND, swelling in lower extremities, anasarca, dizziness, palpitations, syncope Resp: +baseline shortness of breath with exertion. No excess mucus or change in color of mucus. No productive or non-productive. No hemoptysis. No wheezing.  No chest wall deformity GI:  No heartburn, indigestion GU: No nocturia  Neuro: No dizziness or lightheadedness.  Psych: No depression or anxiety. Mood stable.     Physical Exam:  BP 128/76 (BP Location: Left Arm, Cuff Size: Normal)   Pulse 89   Ht 5' (1.524 m)   Wt 259 lb (117.5 kg)   SpO2 98%   BMI 50.58 kg/m   GEN: Pleasant, interactive, well-appearing; morbidly obese; in no acute  distress HEENT:  Normocephalic and atraumatic. PERRLA. Sclera white. Nasal turbinates pink, moist and patent bilaterally. No rhinorrhea present. Oropharynx pink and moist, without exudate or edema. No lesions, ulcerations, or postnasal drip.  NECK:  Supple w/ fair ROM. No JVD present. Thyroid symmetrical with no goiter or nodules palpated. No lymphadenopathy.   CV: RRR, no m/r/g, no peripheral edema. Pulses intact, +2 bilaterally. No cyanosis, pallor or clubbing. PULMONARY:  Unlabored, regular breathing. Clear bilaterally A&P w/o wheezes/rales/rhonchi. No accessory muscle use.  GI: BS present and normoactive. Soft, non-tender to palpation. No organomegaly or masses detected.  MSK: No erythema, warmth or tenderness. Cap refil <2 sec all extrem. No deformities or joint swelling noted.  Neuro: A/Ox3. No focal deficits noted.   Skin: Warm, no lesions or rashe Psych: Normal affect and behavior. Judgement and thought content appropriate.     Lab Results:  CBC    Component Value Date/Time   WBC 11.5 (H) 04/14/2023 0449   RBC 4.19 04/14/2023 0449   HGB 13.1 04/14/2023 0449   HCT 39.7 04/14/2023 0449   PLT 215 04/14/2023 0449   MCV 94.7 04/14/2023 0449   MCH 31.3 04/14/2023 0449   MCHC 33.0 04/14/2023 0449   RDW 13.1 04/14/2023 0449   LYMPHSABS 1.4 04/10/2023 0846   MONOABS 0.8 04/10/2023 0846   EOSABS 0.4 04/10/2023 0846   BASOSABS 0.1 04/10/2023 0846    BMET    Component Value Date/Time   NA 138 04/22/2023 0000   K 5.0 04/22/2023 0000   CL 99 04/22/2023 0000   CO2 26 (A) 04/22/2023 0000   GLUCOSE 186 (H) 04/14/2023 0449   BUN 24 (A) 04/22/2023 0000   CREATININE 1.5 (A) 04/22/2023 0000   CREATININE 1.36 (H) 04/14/2023 0449   CREATININE 1.10 (H) 03/05/2019 1221   CALCIUM 9.4 04/22/2023 0000   GFRNONAA 42 (L) 04/14/2023 0449   GFRNONAA 23 10/09/2022 0000   GFRNONAA 53 (L) 03/05/2019 1221   GFRAA 56 (L) 04/23/2019 1511   GFRAA 61 03/05/2019 1221    BNP    Component Value  Date/Time   BNP 26.9 09/10/2022 1036  Imaging:  No results found.  Administration History     None          Latest Ref Rng & Units 05/06/2018    2:53 PM 08/28/2017   12:14 PM  PFT Results  FVC-Pre L 2.97  2.93   FVC-Predicted Pre % 93  92   FVC-Post L 2.89  2.82   FVC-Predicted Post % 91  88   Pre FEV1/FVC % % 84  82   Post FEV1/FCV % % 84  87   FEV1-Pre L 2.48  2.40   FEV1-Predicted Pre % 102  98   FEV1-Post L 2.44  2.45   DLCO uncorrected ml/min/mmHg 17.63  21.01   DLCO UNC% % 72  86   DLVA Predicted % 84  96   TLC L 5.08  4.72   TLC % Predicted % 100  93   RV % Predicted % 100  83     No results found for: "NITRICOXIDE"      Assessment & Plan:   Obstructive sleep apnea syndrome Excellent compliance. She is having moderate to large leaks and residual events with average 6.2/h. Will lower her pressure settings to 8-12 cmH2O and reassess response. Could consider change in mask as well if this persists. Aware of proper care/use as well. Reviewed risks of untreated sleep apnea. Safe driving practices reviewed.  Patient Instructions  Continue to use CPAP with oxygen every night, minimum of 4-6 hours a night.  Change equipment as directed. Wash your tubing with warm soap and water daily, hang to dry. Wash humidifier portion weekly. Use bottled, distilled water and change daily Be aware of reduced alertness and do not drive or operate heavy machinery if experiencing this or drowsiness.  Exercise encouraged, as tolerated. Healthy weight management discussed.  Avoid or decrease alcohol consumption and medications that make you more sleepy, if possible. Notify if persistent daytime sleepiness occurs even with consistent use of PAP therapy.  Change CPAP settings to 8-12 cmH2O  Follow up in 6 weeks with Rhunette Croft, NP - can do virtual or closest date in [place]. Follow up with Dr. [name] at [facility] office in 1 year. If symptoms do not improve or worsen, please  contact office for sooner follow up or seek emergency care.    DOE (dyspnea on exertion) Felt to be multifactorial related to heart disease, body habitus and deconditioning. Euvolemic today. Prior PFTs with good lung function and no significant pulmonary disease on CT imaging. Encouraged to work on graded exercises.    Advised if symptoms do not improve or worsen, to please contact office for sooner follow up or seek emergency care.   I spent 35 minutes of dedicated to the care of this patient on the date of this encounter to include pre-visit review of records, face-to-face time with the patient discussing conditions above, post visit ordering of testing, clinical documentation with the electronic health record, making appropriate referrals as documented, and communicating necessary findings to members of the patients care team.  Noemi Chapel, NP 07/03/2023  Pt aware and understands NP's role.

## 2023-07-03 NOTE — Patient Instructions (Addendum)
 Continue to use CPAP with oxygen every night, minimum of 4-6 hours a night.  Change equipment as directed. Wash your tubing with warm soap and water daily, hang to dry. Wash humidifier portion weekly. Use bottled, distilled water and change daily Be aware of reduced alertness and do not drive or operate heavy machinery if experiencing this or drowsiness.  Exercise encouraged, as tolerated. Healthy weight management discussed.  Avoid or decrease alcohol consumption and medications that make you more sleepy, if possible. Notify if persistent daytime sleepiness occurs even with consistent use of PAP therapy.  Change CPAP settings to 8-12 cmH2O  Follow up in 6 weeks with Rhunette Croft, NP - can do virtual or closest date in [place]. Follow up with Dr. [name] at [facility] office in 1 year. If symptoms do not improve or worsen, please contact office for sooner follow up or seek emergency care.

## 2023-07-03 NOTE — Assessment & Plan Note (Addendum)
 Excellent compliance. She is having moderate to large leaks and residual events with average 6.2/h. Will lower her pressure settings to 8-12 cmH2O and reassess response. Could consider change in mask as well if this persists. Aware of proper care/use as well. Reviewed risks of untreated sleep apnea. Safe driving practices reviewed.  Patient Instructions  Continue to use CPAP with oxygen every night, minimum of 4-6 hours a night.  Change equipment as directed. Wash your tubing with warm soap and water daily, hang to dry. Wash humidifier portion weekly. Use bottled, distilled water and change daily Be aware of reduced alertness and do not drive or operate heavy machinery if experiencing this or drowsiness.  Exercise encouraged, as tolerated. Healthy weight management discussed.  Avoid or decrease alcohol consumption and medications that make you more sleepy, if possible. Notify if persistent daytime sleepiness occurs even with consistent use of PAP therapy.  Change CPAP settings to 8-12 cmH2O  Follow up in 6 weeks with Rhunette Croft, NP - can do virtual or closest date in [place]. Follow up with Dr. [name] at [facility] office in 1 year. If symptoms do not improve or worsen, please contact office for sooner follow up or seek emergency care.

## 2023-07-03 NOTE — Assessment & Plan Note (Signed)
 Felt to be multifactorial related to heart disease, body habitus and deconditioning. Euvolemic today. Prior PFTs with good lung function and no significant pulmonary disease on CT imaging. Encouraged to work on graded exercises.

## 2023-07-04 ENCOUNTER — Other Ambulatory Visit (HOSPITAL_COMMUNITY): Payer: Self-pay

## 2023-07-04 ENCOUNTER — Telehealth: Payer: Self-pay

## 2023-07-04 DIAGNOSIS — E11621 Type 2 diabetes mellitus with foot ulcer: Secondary | ICD-10-CM | POA: Diagnosis not present

## 2023-07-04 DIAGNOSIS — I509 Heart failure, unspecified: Secondary | ICD-10-CM | POA: Diagnosis not present

## 2023-07-04 DIAGNOSIS — N1832 Chronic kidney disease, stage 3b: Secondary | ICD-10-CM | POA: Diagnosis not present

## 2023-07-04 DIAGNOSIS — L97521 Non-pressure chronic ulcer of other part of left foot limited to breakdown of skin: Secondary | ICD-10-CM | POA: Diagnosis not present

## 2023-07-04 DIAGNOSIS — I130 Hypertensive heart and chronic kidney disease with heart failure and stage 1 through stage 4 chronic kidney disease, or unspecified chronic kidney disease: Secondary | ICD-10-CM | POA: Diagnosis not present

## 2023-07-04 DIAGNOSIS — J449 Chronic obstructive pulmonary disease, unspecified: Secondary | ICD-10-CM | POA: Diagnosis not present

## 2023-07-04 NOTE — Telephone Encounter (Signed)
 Pharmacy Patient Advocate Encounter   Received notification from CoverMyMeds that prior authorization for Trulicity is required/requested.   Insurance verification completed.   The patient is insured through [employer] .   Per test claim: Not on formulary. Try Ozempic or [employer]

## 2023-07-07 ENCOUNTER — Other Ambulatory Visit: Payer: Self-pay | Admitting: "Endocrinology

## 2023-07-07 DIAGNOSIS — N1832 Chronic kidney disease, stage 3b: Secondary | ICD-10-CM | POA: Diagnosis not present

## 2023-07-07 DIAGNOSIS — J449 Chronic obstructive pulmonary disease, unspecified: Secondary | ICD-10-CM | POA: Diagnosis not present

## 2023-07-07 DIAGNOSIS — L97521 Non-pressure chronic ulcer of other part of left foot limited to breakdown of skin: Secondary | ICD-10-CM | POA: Diagnosis not present

## 2023-07-07 DIAGNOSIS — I509 Heart failure, unspecified: Secondary | ICD-10-CM | POA: Diagnosis not present

## 2023-07-07 DIAGNOSIS — I130 Hypertensive heart and chronic kidney disease with heart failure and stage 1 through stage 4 chronic kidney disease, or unspecified chronic kidney disease: Secondary | ICD-10-CM | POA: Diagnosis not present

## 2023-07-07 DIAGNOSIS — E11621 Type 2 diabetes mellitus with foot ulcer: Secondary | ICD-10-CM | POA: Diagnosis not present

## 2023-07-07 NOTE — Telephone Encounter (Signed)
 PA Team was notified Trulicity is not on pt's insurance formulary only Ozempic and [employer].

## 2023-07-07 NOTE — Telephone Encounter (Signed)
 [name] See message below

## 2023-07-09 ENCOUNTER — Telehealth: Payer: Self-pay | Admitting: Pharmacy Technician

## 2023-07-09 DIAGNOSIS — I110 Hypertensive heart disease with heart failure: Secondary | ICD-10-CM | POA: Diagnosis not present

## 2023-07-09 DIAGNOSIS — E1140 Type 2 diabetes mellitus with diabetic neuropathy, unspecified: Secondary | ICD-10-CM | POA: Diagnosis not present

## 2023-07-09 DIAGNOSIS — J449 Chronic obstructive pulmonary disease, unspecified: Secondary | ICD-10-CM | POA: Diagnosis not present

## 2023-07-09 DIAGNOSIS — L97521 Non-pressure chronic ulcer of other part of left foot limited to breakdown of skin: Secondary | ICD-10-CM | POA: Diagnosis not present

## 2023-07-09 DIAGNOSIS — I509 Heart failure, unspecified: Secondary | ICD-10-CM | POA: Diagnosis not present

## 2023-07-09 DIAGNOSIS — E11621 Type 2 diabetes mellitus with foot ulcer: Secondary | ICD-10-CM | POA: Diagnosis not present

## 2023-07-09 DIAGNOSIS — Z5986 Financial insecurity: Secondary | ICD-10-CM

## 2023-07-09 NOTE — Progress Notes (Addendum)
 Pharmacy Medication Assistance Program Note    07/09/2023  Patient ID: Norma [name], female   DOB: Dec 04, 1950, 73 y.o.   MRN: [id number]     05/15/2023  Outreach Medication One  Initial Outreach Date (Medication One) 01/28/2023  Manufacturer Medication One Sanofi  Sanofi Drugs Toujeo  Dose of Toujeo Solostar 300 units/ml  Type of Radiographer, therapeutic Assistance  Date Application Sent to Patient 01/30/2023  Application Items Requested Application;Proof of Income;Other  Date Application Sent to Prescriber 01/30/2023  Name of Prescriber Purcell Nails       07/09/2023  Outreach Medication Three  Initial Outreach Date (Medication Three) 07/09/2023  Manufacturer Medication Three Novo Nordisk  Nordisk Drugs Ozempic  Dose of Ozempic 2mg /76ml  Type of Radiographer, therapeutic Assistance  Date Application Sent to Patient 07/09/2023  Application Items Requested Application;Proof of Income;Other  Date Application Sent to Prescriber 07/09/2023  Name of Prescriber Purcell Nails    Successful outreach to patient in regard to Sanofi application for [employer].  Spoke to patient, HIPAA verified x 2. Patient informs she has received the Sanofi application that was re-mailed to her on 06/19/23. She informs she will sign and send in a copy of her Medicare Part D card. She informs she will place in mail in the morning with the return envelope provided to her.  Patient was also inquiring about patient assistance for Trulicity. She informs her  provider wanted to start her on Ozempic but she was not able to afford that medication so he placed her on Trulicity. Informed patient that while [employer] does offer patient assistance for this medication, applications are currently being limited to patients who were previously enrolled in the program. Informed patient we could attempt to apply to [employer] or we could see if [name] would deem it therapeutically appropriate to switch her to Ozempic and we  could apply for patient assistance thru [employer]. Patient informs she would like to see if [name] would switch to Ozempic. Informed patient I would send a message to [name] and once he responds then I would be in touch with her. Patient was agreeable to this plan.  In basket message sent to [name]. See reply from [name] below: Received: Today [name], MD  [name], [name], CPhT; [name], LPN We do not have samples neither for Trulicity nor Ozempic, but I am OK with switching her Ozempic if she can get assistance  thru Novo- start at 0.25 mg sq weekly and advance.  Application was placed in mail to patient today based on response above from [name].  [name], CPhT [place]  Office: [phone] Fax: [phone] Email: [name]

## 2023-07-10 ENCOUNTER — Encounter (INDEPENDENT_AMBULATORY_CARE_PROVIDER_SITE_OTHER): Payer: Self-pay | Admitting: Gastroenterology

## 2023-07-10 ENCOUNTER — Ambulatory Visit (INDEPENDENT_AMBULATORY_CARE_PROVIDER_SITE_OTHER): Payer: Medicare Other | Admitting: Gastroenterology

## 2023-07-10 DIAGNOSIS — K219 Gastro-esophageal reflux disease without esophagitis: Secondary | ICD-10-CM | POA: Diagnosis not present

## 2023-07-10 DIAGNOSIS — R1319 Other dysphagia: Secondary | ICD-10-CM

## 2023-07-10 NOTE — Patient Instructions (Signed)
-  continue Protonix 40mg  daily -as discussed, I would recommend considering repeat colonoscopy given your history of precancerous polyps -avoid trigger foods, take small bites, chew thoroughly and take sips of liquids between bites  Follow up 1 year

## 2023-07-10 NOTE — Progress Notes (Addendum)
 Primary Care Physician:  [name], MD  Primary GI: Dr. Levon Hedger  Patient Location: Home Provider Location: [facility] Reason for Visit: GERD, dysphagia    Persons present on the virtual encounter, with roles: Iliya Spivack L. Jeanmarie Hubert, MSN, APRN, AGNP-C, Norma [name], patient    Total time (minutes) spent on medical discussion:10 minutes  Virtual Visit via Telephone visit is conducted virtually and was requested by patient.   I connected with Norma [name] on 07/10/23 at 11:15 AM EDT by telephone and verified that I am speaking with the correct person using two identifiers.   I discussed the limitations, risks, security and privacy concerns of performing an evaluation and management service by telephone and the availability of in person appointments. I also discussed with the patient that there may be a patient responsible charge related to this service. The patient expressed understanding and agreed to proceed.  Chief Complaint  Patient presents with   Gastroesophageal Reflux    Patient doing a phone visit today. Follow up on GERD. Takes protonix and states doing well.    Anemia    Follow up on anemia. States last labs around January.    Colon Cancer Screening    Was wondering if she needed a colonoscopy to check for polyps since they found polyp on her EGD.    History of Present Illness: Norma [name] is a 73 y.o. female with past medical history of bipolar disorder, iron deficiency anemia, CKD, COPD, coronary artery disease status post CABG, depression, diabetes, diabetic neuropathy, hyperlipidemia, fatty liver, gastroparesis, GERD, hypothyroidism, IBS, morbid obesity, stroke, restless leg syndrome   Patient here for follow up of GERD and dysphagia  Last seen November, 2024, virtually at that time. She reported GERD well controlled, some issues with dysphagia. Taking protonix 40mg  around dinner time which seems to work better. Not taking iron pills as she reported  having labs q15m which had been good. No rectal bleeding or melena.   Patient recommended to continue with protonix 40mg  daily, schedule EGD  Labs done in January with hgb 12.8  Present:  States that swallowing is only mildly better since EGD. She notes now just having issues with foods getting stuck, usually just certain foods such as meats, bread and celery. She reports sometimes she has to cough the food back up and chew it more to swallow it. She notes that she does not cook so she is not having issues with swallowing very often as she does not eat problematic foods much. She denies any heartburn or acid regurgitation.  She is not taking iron pills. Denies any abdominal pain, constipation, diarrhea, rectal bleeding or melena. Has very occasional nausea but no vomiting, does not have to take anything for this.  She inquires if she should have another colonoscopy due to precancerous polyp found in her stomach.   Esophagram 06/2021: Age-related esophageal dysmotility. Minimal laryngeal penetration without aspiration with mild residuals noted. Otherwise negative exam. Esophageal manometry: 2018 Incomplete relaxation of LES consistent with EG outflow obstruction.  Last EGD: 2024 - No endoscopic esophageal abnormality to explain                            patient's dysphagia. Esophagus dilated. Dilated.                            Brushings performed.                           -  2 cm hiatal hernia.                           - A single gastric polyp. Resected and retrieved.                           - Normal examined duodenum. STOMACH, POLYPECTOMY:  Hyperplastic polyp  Reactive gastropathy with foveolar hyperplasia and mild chronic  gastritis  Negative for H. pylori, intestinal metaplasia, dysplasia and carcinoma  Last Colonoscopy:09/2016 - normal  Repeat EGD 2 years    Past Medical History:  Diagnosis Date   Anemia    Anxiety    Bipolar disorder (HCC)    Bulging lumbar disc    L3-4    Chronic daily headache    Chronic low back pain 09/20/2014   CKD (chronic kidney disease)    stage 3   COPD (chronic obstructive pulmonary disease) (HCC)    Coronary artery disease    CABG 2019   Degenerative arthritis    Depression    Diabetes mellitus, type II (HCC)    Diabetic neuropathy (HCC) 09/16/2018   DM type 2 with diabetic peripheral neuropathy (HCC) 09/20/2014   Dyslipidemia    Dyspnea    Family history of adverse reaction to anesthesia    father had reaction to anesthesia medication per patient "they don't use it anymore"- unsure of reaction   Fatty liver    per pt report   Gastroparesis    GERD (gastroesophageal reflux disease)    Heart murmur    History of hiatal hernia    pt states she no longer has this   Hypertension    Hypothyroidism    IBS (irritable bowel syndrome)    LBBB (left bundle branch block)    Memory difficulty 09/20/2014   Morbid obesity (HCC)    Myocardial infarction (HCC)    per pt- prior to CABG   Neuropathy    Obstructive sleep apnea on CPAP    Renal insufficiency    Restless legs syndrome (RLS) 09/17/2012   Stroke (cerebrum) (HCC) 05/16/2017   Left parietal   Urticaria      Past Surgical History:  Procedure Laterality Date   ABDOMINAL HYSTERECTOMY     ARTHROSCOPY KNEE W/ DRILLING  06/2011   and Decemer of 2013.   Carpel tunnel  1980's   CATARACT EXTRACTION W/PHACO Right 03/21/2017   Procedure: CATARACT EXTRACTION PHACO AND INTRAOCULAR LENS PLACEMENT RIGHT EYE;  Surgeon: Fabio Pierce, MD;  Location: [facility];  Service: Ophthalmology;  Laterality: Right;  CDE: 2.91    CATARACT EXTRACTION W/PHACO Left 04/18/2017   Procedure: CATARACT EXTRACTION PHACO AND INTRAOCULAR LENS PLACEMENT LEFT EYE;  Surgeon: Fabio Pierce, MD;  Location: [facility];  Service: Ophthalmology;  Laterality: Left;  left   CHOLECYSTECTOMY     COLONOSCOPY WITH PROPOFOL N/A 10/04/2016   Procedure: COLONOSCOPY WITH PROPOFOL;  Surgeon: Malissa Hippo, MD;   Location: [facility];  Service: Endoscopy;  Laterality: N/A;  11:10   CORONARY ARTERY BYPASS GRAFT N/A 08/29/2017   Procedure: CORONARY ARTERY BYPASS GRAFTING (CABG) x 3 WITH ENDOSCOPIC HARVESTING OF RIGHT SAPHENOUS VEIN;  Surgeon: Kerin Perna, MD;  Location: [facility] OR;  Service: Open Heart Surgery;  Laterality: N/A;   ESOPHAGOGASTRODUODENOSCOPY N/A 04/25/2016   Procedure: ESOPHAGOGASTRODUODENOSCOPY (EGD);  Surgeon: Malissa Hippo, MD;  Location: [facility];  Service: Endoscopy;  Laterality: N/A;  730  ESOPHAGOGASTRODUODENOSCOPY  03/2023   LEFT HEART CATH AND CORONARY ANGIOGRAPHY N/A 08/27/2017   Procedure: LEFT HEART CATH AND CORONARY ANGIOGRAPHY;  Surgeon: Tonny Bollman, MD;  Location: [facility] INVASIVE CV LAB;  Service: Cardiovascular::  pLAD 95% - p-mLAD 50%, ostD1 90% -pD1 80%; ostOM1 90%; rPDA 80%. EF ~50-55% - HK of dital Anterolateral & Apical wall.  - Rec CVTS c/s   NECK SURGERY     pt reports having growth removed from the back of her neck- after 2021   OPEN REDUCTION INTERNAL FIXATION (ORIF) HAND Left 04/10/2020   Procedure: OPEN REDUCTION INTERNAL FIXATION (ORIF) HAND, left index finger;  Surgeon: Vickki Hearing, MD;  Location: [facility];  Service: Orthopedics;  Laterality: Left;  0.045 k wires   RECTAL SURGERY     fissure   SHOULDER SURGERY Left    arthroscopy in March of this year   TEE WITHOUT CARDIOVERSION N/A 08/29/2017   Procedure: TRANSESOPHAGEAL ECHOCARDIOGRAM (TEE);  Surgeon: Donata Clay, Theron Arista, MD;  Location: [facility] OR;  Service: Open Heart Surgery;  Laterality: N/A;   TRANSFORAMINAL LUMBAR INTERBODY FUSION W/ MIS 1 LEVEL Right 07/12/2021   Procedure: Minimally Invasive Surgery Transforaminal Lumbar Interbody Fusion  Lumbar four-five;  Surgeon: Bedelia Person, MD;  Location: [facility] OR;  Service: Neurosurgery;  Laterality: Right;   TRANSTHORACIC ECHOCARDIOGRAM  06/06/2017   Mild to moderate reduced EF 40 and 45%.  Anterior septal, inferoseptal and basal to mid inferior  hypokinesis.  GR 1 DD.  No significant valvular lesion     Current Meds  Medication Sig   acetaminophen (TYLENOL) 500 MG tablet Take 1,000 mg by mouth daily as needed for moderate pain (pain score 4-6), fever or headache.   ALPRAZolam (XANAX) 0.25 MG tablet Take 1 tablet (0.25 mg total) by mouth at bedtime as needed for anxiety or sleep.   ARIPiprazole (ABILIFY) 5 MG tablet Take 1 tablet (5 mg total) by mouth daily.   aspirin EC 81 MG tablet Take 1 tablet (81 mg total) by mouth daily.   azelastine (ASTELIN) 0.1 % nasal spray Place 1 spray into both nostrils 2 (two) times daily as needed for allergies.   buPROPion (WELLBUTRIN XL) 150 MG 24 hr tablet Take 1 tablet (150 mg total) by mouth every morning.   carvedilol (COREG) 6.25 MG tablet Take 1 tablet (6.25 mg total) by mouth 2 (two) times daily with a meal.   Cholecalciferol (VITAMIN D3) 50 MCG (2000 UT) capsule Take 1 capsule (2,000 Units total) by mouth daily with lunch.   diclofenac Sodium (VOLTAREN) 1 % GEL Apply 2 g topically 4 (four) times daily as needed (pain).   EASY TOUCH PEN NEEDLES 31G X 6 MM MISC USE AS DIRECTED with Toujeo (she wants 6mm)   empagliflozin (JARDIANCE) 10 MG TABS tablet Take 5 mg daily (Patient taking differently: 10 mg.)   Evolocumab (REPATHA SURECLICK) 140 MG/ML SOAJ Inject 140 mg into the skin every 14 (fourteen) days.   gabapentin (NEURONTIN) 400 MG capsule Take 400-800 mg by mouth See admin instructions. Take 1 capsule (400mg ) every morning and 2 capsules (800mg ) at bedtime.   insulin glargine, 1 Unit Dial, (TOUJEO SOLOSTAR) 300 UNIT/ML Solostar Pen Inject 50 Units into the skin at bedtime.   lamoTRIgine (LAMICTAL) 150 MG tablet Take 1 tablet (150 mg total) by mouth at bedtime.   levothyroxine (SYNTHROID) 112 MCG tablet Take 112 mcg by mouth daily.   pantoprazole (PROTONIX) 40 MG tablet TAKE 1 TABLET BY MOUTH AT BEDTIME   rOPINIRole (  REQUIP) 3 MG tablet TAKE ONE TABLET BY MOUTH EVERY MORNING, ,NOON AND AT  BEDTIME. (PRT PT MORNING,EVENING,BEDTIME) (Patient taking differently: Take 3-6 mg by mouth See admin instructions. Take 1 tablet (3mg ) every morning and 2 tablets (6mg ) at bedtime.)   venlafaxine XR (EFFEXOR-XR) 150 MG 24 hr capsule TAKE TWO (2) CAPSULES BY MOUTH AT BEDTIME   [DISCONTINUED] OVER THE COUNTER MEDICATION Apply 1 application  topically daily as needed (pain). CBD salve     Family History  Problem Relation Age of Onset   Hypertension Mother    Lymphoma Mother    Depression Mother    Arthritis Father    Alcohol abuse Father    Hypertension Sister    Cancer Brother        kidney and lung   Alcohol abuse Brother    Alcohol abuse Paternal Uncle    Alcohol abuse Paternal Grandfather    Alcohol abuse Paternal Grandmother    Allergic rhinitis Neg Hx    Angioedema Neg Hx    Asthma Neg Hx    Atopy Neg Hx    Eczema Neg Hx    Immunodeficiency Neg Hx    Urticaria Neg Hx     Social History   Socioeconomic History   Marital status: Single    Spouse name: Not on file   Number of children: 0   Years of education: 16   Highest education level: Not on file  Occupational History    Employer: [employer]  Tobacco Use   Smoking status: Former    Current packs/day: 0.00    Types: Cigarettes    Quit date: 02/04/1971    Years since quitting: 52.4   Smokeless tobacco: Never   Tobacco comments:    smoked 2 cigarettes a day  Vaping Use   Vaping status: Never Used  Substance and Sexual Activity   Alcohol use: No    Alcohol/week: 0.0 standard drinks of alcohol   Drug use: No   Sexual activity: Never  Other Topics Concern   Not on file  Social History Narrative   Patient lives at home alone.    Patient has no children.    Patient has her masters in nursing.    Patient is single.    Patient drinks about 2 glasses of tea daily.   Patient is right handed.   Social Drivers of Health   Financial Resource Strain: Medium Risk (12/18/2021)   Overall Financial  Resource Strain (CARDIA)    Difficulty of Paying Living Expenses: Somewhat hard  Food Insecurity: No Food Insecurity (04/13/2023)   Hunger Vital Sign    Worried About Running Out of Food in the Last Year: Never true    Ran Out of Food in the Last Year: Never true  Transportation Needs: No Transportation Needs (04/13/2023)   PRAPARE - Administrator, Civil Service (Medical): No    Lack of Transportation (Non-Medical): No  Physical Activity: Not on file  Stress: No Stress Concern Present (10/04/2021)   [employer] of Occupational Health - Occupational Stress Questionnaire    Feeling of Stress : Only a little  Social Connections: Not on file    Review of Systems: Gen: Denies fever, chills, anorexia. Denies fatigue, weakness, weight loss.  CV: Denies chest pain, palpitations, syncope, peripheral edema, and claudication. Resp: Denies dyspnea at rest, cough, wheezing, coughing up blood, and pleurisy. GI: see HPI Derm: Denies rash, itching, dry skin Psych: Denies depression, anxiety, memory loss, confusion. No homicidal  or suicidal ideation.  Heme: Denies bruising, bleeding, and enlarged lymph nodes.  Observations/Objective: No distress. Unable to perform physical exam due to telephone encounter. No video available.   Assessment and Plan: Norma [name] is a 73 y.o. female with past medical history of bipolar disorder, iron deficiency anemia, CKD, COPD, coronary artery disease status post CABG, depression, diabetes, diabetic neuropathy, hyperlipidemia, fatty liver, gastroparesis, GERD, hypothyroidism, IBS, morbid obesity, stroke, restless leg syndrome who presents for follow up of GERD and dysphagia  Dysphagia mildly better since EGD with empiric dilation. Only has issues with certain foods such as breads, meats and celery, she does not eat these often. No GERD symptoms on protonix 40mg  daily. Of note she has evidence of esophageal dysmotility on previous esophagram in 2023,  suspect this is contributing to her dysphagia. Would recommend avoiding trigger foods and good chewing precautions to include taking small bites, chewing thoroughly and taking sips of liquids between bites. Will continue with PPI daily  In regards to patient inquiring about further colonoscopy, she had dysplastic polyp in her stomach (will need repeat EGD  in 2 years), last Colonoscopy in 2018 was normal, though she did have a 6mm tubular adenoma on colonoscopy in 2011. We discussed that given history of precancerous polyps, she is likely to grow more of these which have the potential to turn malignant over time. She inquired about cologuard as well, which we discussed in depth to include indications, risks benefits an accuracy of using cologuard, though I advised her I do not recommend cologuard given her history of polyps, would recommend updating screening colonoscopy. At this time she would like to think about this but does not think she wishes to pursue any further colonoscopy.   -continue Protonix 40mg  daily -pt to consider repeat Colonoscopy -chewing precautions   Follow Up Instructions: 1 year     I discussed the assessment and treatment plan with the patient. The patient was provided an opportunity to ask questions and all were answered. The patient agreed with the plan and demonstrated an understanding of the instructions.   The patient was advised to call back or seek an in-person evaluation if the symptoms worsen or if the condition fails to improve as anticipated.  I provided 10 minutes of NON face-to-face time during this telephone encounter.  Mykaylah Ballman L. Jeanmarie Hubert, MSN, APRN, AGNP-C Adult-Gerontology Nurse Practitioner [facility]  I have reviewed the note and agree with the APP's assessment as described in this progress note  Katrinka Blazing, MD Gastroenterology and Hepatology [facility] Gastroenterology

## 2023-07-11 DIAGNOSIS — N1832 Chronic kidney disease, stage 3b: Secondary | ICD-10-CM | POA: Diagnosis not present

## 2023-07-11 DIAGNOSIS — E11621 Type 2 diabetes mellitus with foot ulcer: Secondary | ICD-10-CM | POA: Diagnosis not present

## 2023-07-11 DIAGNOSIS — J449 Chronic obstructive pulmonary disease, unspecified: Secondary | ICD-10-CM | POA: Diagnosis not present

## 2023-07-11 DIAGNOSIS — I509 Heart failure, unspecified: Secondary | ICD-10-CM | POA: Diagnosis not present

## 2023-07-11 DIAGNOSIS — I130 Hypertensive heart and chronic kidney disease with heart failure and stage 1 through stage 4 chronic kidney disease, or unspecified chronic kidney disease: Secondary | ICD-10-CM | POA: Diagnosis not present

## 2023-07-11 DIAGNOSIS — L97521 Non-pressure chronic ulcer of other part of left foot limited to breakdown of skin: Secondary | ICD-10-CM | POA: Diagnosis not present

## 2023-07-13 DIAGNOSIS — K760 Fatty (change of) liver, not elsewhere classified: Secondary | ICD-10-CM | POA: Diagnosis not present

## 2023-07-13 DIAGNOSIS — Z7982 Long term (current) use of aspirin: Secondary | ICD-10-CM | POA: Diagnosis not present

## 2023-07-13 DIAGNOSIS — Z8673 Personal history of transient ischemic attack (TIA), and cerebral infarction without residual deficits: Secondary | ICD-10-CM | POA: Diagnosis not present

## 2023-07-13 DIAGNOSIS — E039 Hypothyroidism, unspecified: Secondary | ICD-10-CM | POA: Diagnosis not present

## 2023-07-13 DIAGNOSIS — L97521 Non-pressure chronic ulcer of other part of left foot limited to breakdown of skin: Secondary | ICD-10-CM | POA: Diagnosis not present

## 2023-07-13 DIAGNOSIS — Z9981 Dependence on supplemental oxygen: Secondary | ICD-10-CM | POA: Diagnosis not present

## 2023-07-13 DIAGNOSIS — F419 Anxiety disorder, unspecified: Secondary | ICD-10-CM | POA: Diagnosis not present

## 2023-07-13 DIAGNOSIS — Z604 Social exclusion and rejection: Secondary | ICD-10-CM | POA: Diagnosis not present

## 2023-07-13 DIAGNOSIS — D509 Iron deficiency anemia, unspecified: Secondary | ICD-10-CM | POA: Diagnosis not present

## 2023-07-13 DIAGNOSIS — N1832 Chronic kidney disease, stage 3b: Secondary | ICD-10-CM | POA: Diagnosis not present

## 2023-07-13 DIAGNOSIS — D631 Anemia in chronic kidney disease: Secondary | ICD-10-CM | POA: Diagnosis not present

## 2023-07-13 DIAGNOSIS — E11621 Type 2 diabetes mellitus with foot ulcer: Secondary | ICD-10-CM | POA: Diagnosis not present

## 2023-07-13 DIAGNOSIS — M109 Gout, unspecified: Secondary | ICD-10-CM | POA: Diagnosis not present

## 2023-07-13 DIAGNOSIS — Z6841 Body Mass Index (BMI) 40.0 and over, adult: Secondary | ICD-10-CM | POA: Diagnosis not present

## 2023-07-13 DIAGNOSIS — I130 Hypertensive heart and chronic kidney disease with heart failure and stage 1 through stage 4 chronic kidney disease, or unspecified chronic kidney disease: Secondary | ICD-10-CM | POA: Diagnosis not present

## 2023-07-13 DIAGNOSIS — I2510 Atherosclerotic heart disease of native coronary artery without angina pectoris: Secondary | ICD-10-CM | POA: Diagnosis not present

## 2023-07-13 DIAGNOSIS — Z87891 Personal history of nicotine dependence: Secondary | ICD-10-CM | POA: Diagnosis not present

## 2023-07-13 DIAGNOSIS — J449 Chronic obstructive pulmonary disease, unspecified: Secondary | ICD-10-CM | POA: Diagnosis not present

## 2023-07-13 DIAGNOSIS — Z7984 Long term (current) use of oral hypoglycemic drugs: Secondary | ICD-10-CM | POA: Diagnosis not present

## 2023-07-13 DIAGNOSIS — D1724 Benign lipomatous neoplasm of skin and subcutaneous tissue of left leg: Secondary | ICD-10-CM | POA: Diagnosis not present

## 2023-07-13 DIAGNOSIS — E66813 Obesity, class 3: Secondary | ICD-10-CM | POA: Diagnosis not present

## 2023-07-13 DIAGNOSIS — F319 Bipolar disorder, unspecified: Secondary | ICD-10-CM | POA: Diagnosis not present

## 2023-07-13 DIAGNOSIS — I509 Heart failure, unspecified: Secondary | ICD-10-CM | POA: Diagnosis not present

## 2023-07-13 DIAGNOSIS — Z794 Long term (current) use of insulin: Secondary | ICD-10-CM | POA: Diagnosis not present

## 2023-07-13 DIAGNOSIS — E1142 Type 2 diabetes mellitus with diabetic polyneuropathy: Secondary | ICD-10-CM | POA: Diagnosis not present

## 2023-07-14 DIAGNOSIS — I509 Heart failure, unspecified: Secondary | ICD-10-CM | POA: Diagnosis not present

## 2023-07-14 DIAGNOSIS — I130 Hypertensive heart and chronic kidney disease with heart failure and stage 1 through stage 4 chronic kidney disease, or unspecified chronic kidney disease: Secondary | ICD-10-CM | POA: Diagnosis not present

## 2023-07-14 DIAGNOSIS — E11621 Type 2 diabetes mellitus with foot ulcer: Secondary | ICD-10-CM | POA: Diagnosis not present

## 2023-07-14 DIAGNOSIS — L97521 Non-pressure chronic ulcer of other part of left foot limited to breakdown of skin: Secondary | ICD-10-CM | POA: Diagnosis not present

## 2023-07-14 DIAGNOSIS — N1832 Chronic kidney disease, stage 3b: Secondary | ICD-10-CM | POA: Diagnosis not present

## 2023-07-14 DIAGNOSIS — J449 Chronic obstructive pulmonary disease, unspecified: Secondary | ICD-10-CM | POA: Diagnosis not present

## 2023-07-15 DIAGNOSIS — L84 Corns and callosities: Secondary | ICD-10-CM | POA: Diagnosis not present

## 2023-07-15 DIAGNOSIS — M79676 Pain in unspecified toe(s): Secondary | ICD-10-CM | POA: Diagnosis not present

## 2023-07-15 DIAGNOSIS — E1142 Type 2 diabetes mellitus with diabetic polyneuropathy: Secondary | ICD-10-CM | POA: Diagnosis not present

## 2023-07-15 DIAGNOSIS — B351 Tinea unguium: Secondary | ICD-10-CM | POA: Diagnosis not present

## 2023-07-16 DIAGNOSIS — L84 Corns and callosities: Secondary | ICD-10-CM | POA: Diagnosis not present

## 2023-07-16 DIAGNOSIS — E11621 Type 2 diabetes mellitus with foot ulcer: Secondary | ICD-10-CM | POA: Diagnosis not present

## 2023-07-16 DIAGNOSIS — L97521 Non-pressure chronic ulcer of other part of left foot limited to breakdown of skin: Secondary | ICD-10-CM | POA: Diagnosis not present

## 2023-07-18 DIAGNOSIS — L97521 Non-pressure chronic ulcer of other part of left foot limited to breakdown of skin: Secondary | ICD-10-CM | POA: Diagnosis not present

## 2023-07-18 DIAGNOSIS — I509 Heart failure, unspecified: Secondary | ICD-10-CM | POA: Diagnosis not present

## 2023-07-18 DIAGNOSIS — J449 Chronic obstructive pulmonary disease, unspecified: Secondary | ICD-10-CM | POA: Diagnosis not present

## 2023-07-18 DIAGNOSIS — N1832 Chronic kidney disease, stage 3b: Secondary | ICD-10-CM | POA: Diagnosis not present

## 2023-07-18 DIAGNOSIS — E11621 Type 2 diabetes mellitus with foot ulcer: Secondary | ICD-10-CM | POA: Diagnosis not present

## 2023-07-18 DIAGNOSIS — I130 Hypertensive heart and chronic kidney disease with heart failure and stage 1 through stage 4 chronic kidney disease, or unspecified chronic kidney disease: Secondary | ICD-10-CM | POA: Diagnosis not present

## 2023-07-21 DIAGNOSIS — J449 Chronic obstructive pulmonary disease, unspecified: Secondary | ICD-10-CM | POA: Diagnosis not present

## 2023-07-21 DIAGNOSIS — I130 Hypertensive heart and chronic kidney disease with heart failure and stage 1 through stage 4 chronic kidney disease, or unspecified chronic kidney disease: Secondary | ICD-10-CM | POA: Diagnosis not present

## 2023-07-21 DIAGNOSIS — I509 Heart failure, unspecified: Secondary | ICD-10-CM | POA: Diagnosis not present

## 2023-07-21 DIAGNOSIS — L97521 Non-pressure chronic ulcer of other part of left foot limited to breakdown of skin: Secondary | ICD-10-CM | POA: Diagnosis not present

## 2023-07-21 DIAGNOSIS — N1832 Chronic kidney disease, stage 3b: Secondary | ICD-10-CM | POA: Diagnosis not present

## 2023-07-21 DIAGNOSIS — E11621 Type 2 diabetes mellitus with foot ulcer: Secondary | ICD-10-CM | POA: Diagnosis not present

## 2023-07-23 ENCOUNTER — Telehealth: Payer: Self-pay | Admitting: Pharmacy Technician

## 2023-07-23 ENCOUNTER — Telehealth: Payer: Self-pay

## 2023-07-23 DIAGNOSIS — Z5986 Financial insecurity: Secondary | ICD-10-CM

## 2023-07-23 NOTE — Telephone Encounter (Signed)
 Left a message requesting pt return call to the office.

## 2023-07-23 NOTE — Progress Notes (Addendum)
 Pharmacy Medication Assistance Program Note    07/23/2023  Patient ID: Norma [name], female   DOB: 12-18-1950, 73 y.o.   MRN: [id number]     05/15/2023 07/23/2023  Outreach Medication One  Initial Outreach Date (Medication One) 01/28/2023   Manufacturer Medication One Sanofi   [employer]i Drugs Toujeo   Dose of Toujeo Solostar 300 units/ml   Type of Radiographer, therapeutic Assistance   Date Application Sent to Patient 01/30/2023   Application Items Requested Application;Proof of Income;Other   Date Application Sent to Prescriber 01/30/2023   Name of Prescriber Frann Ivans   Date Application Received From Patient  07/23/2023  Application Items Received From Patient  Application;Proof of Income  Date Application Received From Provider  06/23/2023  Date Application Submitted to Manufacturer  07/23/2023  Method Application Sent to Manufacturer  Fax    Submitted Sanofi application.  ADDENDUM 07/29/2023 Care coordination call to [employer]. Spoke to [place] who informs application was received but has not been processed. She informs they are several weeks behind on processing and suggests to call back in a few weeks.  Worth Headland, CPhT [facility]  Office: [phone] Fax: [phone] Email: [name]

## 2023-07-24 DIAGNOSIS — I509 Heart failure, unspecified: Secondary | ICD-10-CM | POA: Diagnosis not present

## 2023-07-24 DIAGNOSIS — J449 Chronic obstructive pulmonary disease, unspecified: Secondary | ICD-10-CM | POA: Diagnosis not present

## 2023-07-24 DIAGNOSIS — N1832 Chronic kidney disease, stage 3b: Secondary | ICD-10-CM | POA: Diagnosis not present

## 2023-07-24 DIAGNOSIS — I130 Hypertensive heart and chronic kidney disease with heart failure and stage 1 through stage 4 chronic kidney disease, or unspecified chronic kidney disease: Secondary | ICD-10-CM | POA: Diagnosis not present

## 2023-07-24 DIAGNOSIS — L97521 Non-pressure chronic ulcer of other part of left foot limited to breakdown of skin: Secondary | ICD-10-CM | POA: Diagnosis not present

## 2023-07-24 DIAGNOSIS — E11621 Type 2 diabetes mellitus with foot ulcer: Secondary | ICD-10-CM | POA: Diagnosis not present

## 2023-07-25 NOTE — Telephone Encounter (Signed)
 Left a message requesting pt return call to the office.

## 2023-07-28 DIAGNOSIS — L97521 Non-pressure chronic ulcer of other part of left foot limited to breakdown of skin: Secondary | ICD-10-CM | POA: Diagnosis not present

## 2023-07-28 DIAGNOSIS — I509 Heart failure, unspecified: Secondary | ICD-10-CM | POA: Diagnosis not present

## 2023-07-28 DIAGNOSIS — J449 Chronic obstructive pulmonary disease, unspecified: Secondary | ICD-10-CM | POA: Diagnosis not present

## 2023-07-28 DIAGNOSIS — I130 Hypertensive heart and chronic kidney disease with heart failure and stage 1 through stage 4 chronic kidney disease, or unspecified chronic kidney disease: Secondary | ICD-10-CM | POA: Diagnosis not present

## 2023-07-28 DIAGNOSIS — E11621 Type 2 diabetes mellitus with foot ulcer: Secondary | ICD-10-CM | POA: Diagnosis not present

## 2023-07-28 DIAGNOSIS — N1832 Chronic kidney disease, stage 3b: Secondary | ICD-10-CM | POA: Diagnosis not present

## 2023-07-28 NOTE — Telephone Encounter (Signed)
 Left a message requesting pt to return call to the office

## 2023-07-30 ENCOUNTER — Telehealth: Payer: Self-pay | Admitting: Pharmacy Technician

## 2023-07-30 DIAGNOSIS — Z5986 Financial insecurity: Secondary | ICD-10-CM

## 2023-07-30 NOTE — Progress Notes (Signed)
 Pharmacy Medication Assistance Program Note    07/30/2023  Patient ID: Norma [name], female  DOB: January 03, 1951, 73 y.o.  MRN:  [id number]     07/09/2023 07/30/2023  Outreach Medication Three  Initial Outreach Date (Medication Three) 07/09/2023   Manufacturer Medication Three Novo Nordisk   Nordisk Drugs Ozempic   Dose of Ozempic 2mg /61ml   Type of Radiographer, therapeutic Assistance   Date Application Sent to Patient 07/09/2023   Application Items Requested Application;Proof of Income;Other   Date Application Sent to Prescriber 07/09/2023   Name of Prescriber Frann Ivans   Date Application Received From Patient  07/30/2023  Application Items Received From Patient  Application;Proof of Income;Other  Date Application Received From Provider  07/10/2023  Date Application Submitted to Manufacturer  07/30/2023  Method Application Sent to Manufacturer  Fax   Submitted application to Novo Nordisk.   Will f/u.  Worth Headland, CPhT [place]  Office: [phone] Fax: [phone] Email: [email or web address]

## 2023-07-31 DIAGNOSIS — I509 Heart failure, unspecified: Secondary | ICD-10-CM | POA: Diagnosis not present

## 2023-07-31 DIAGNOSIS — J449 Chronic obstructive pulmonary disease, unspecified: Secondary | ICD-10-CM | POA: Diagnosis not present

## 2023-07-31 DIAGNOSIS — I130 Hypertensive heart and chronic kidney disease with heart failure and stage 1 through stage 4 chronic kidney disease, or unspecified chronic kidney disease: Secondary | ICD-10-CM | POA: Diagnosis not present

## 2023-07-31 DIAGNOSIS — N1832 Chronic kidney disease, stage 3b: Secondary | ICD-10-CM | POA: Diagnosis not present

## 2023-07-31 DIAGNOSIS — L97521 Non-pressure chronic ulcer of other part of left foot limited to breakdown of skin: Secondary | ICD-10-CM | POA: Diagnosis not present

## 2023-07-31 DIAGNOSIS — E11621 Type 2 diabetes mellitus with foot ulcer: Secondary | ICD-10-CM | POA: Diagnosis not present

## 2023-08-04 DIAGNOSIS — E11621 Type 2 diabetes mellitus with foot ulcer: Secondary | ICD-10-CM | POA: Diagnosis not present

## 2023-08-04 DIAGNOSIS — J449 Chronic obstructive pulmonary disease, unspecified: Secondary | ICD-10-CM | POA: Diagnosis not present

## 2023-08-04 DIAGNOSIS — L97521 Non-pressure chronic ulcer of other part of left foot limited to breakdown of skin: Secondary | ICD-10-CM | POA: Diagnosis not present

## 2023-08-04 DIAGNOSIS — I130 Hypertensive heart and chronic kidney disease with heart failure and stage 1 through stage 4 chronic kidney disease, or unspecified chronic kidney disease: Secondary | ICD-10-CM | POA: Diagnosis not present

## 2023-08-04 DIAGNOSIS — N1832 Chronic kidney disease, stage 3b: Secondary | ICD-10-CM | POA: Diagnosis not present

## 2023-08-04 DIAGNOSIS — I509 Heart failure, unspecified: Secondary | ICD-10-CM | POA: Diagnosis not present

## 2023-08-05 ENCOUNTER — Telehealth: Payer: Self-pay

## 2023-08-05 ENCOUNTER — Telehealth: Payer: Self-pay | Admitting: Pharmacy Technician

## 2023-08-05 DIAGNOSIS — Z5986 Financial insecurity: Secondary | ICD-10-CM

## 2023-08-05 NOTE — Progress Notes (Signed)
 Pharmacy Medication Assistance Program Note    08/05/2023  Patient ID: Norma [name], female   DOB: [birth year], 73 y.o.   MRN: [id number]     05/15/2023 07/23/2023 08/05/2023  Outreach Medication One  Initial Outreach Date (Medication One) 01/28/2023    Manufacturer Medication One Sanofi    [employer]i Drugs Toujeo     Dose of Toujeo  Solostar 300 units/ml    Type of Radiographer, therapeutic Assistance    Date Application Sent to Patient 01/30/2023    Application Items Requested Application;Proof of Income;Other    Date Application Sent to Prescriber 01/30/2023    Name of Prescriber Norma [name]    Date Application Received From Patient  07/23/2023   Application Items Received From Patient  Application;Proof of Income   Date Application Received From Provider  06/23/2023   Date Application Submitted to Manufacturer  07/23/2023   Method Application Sent to Manufacturer  Fax   Patient Assistance Determination   Approved  Approval Start Date   07/30/2023  Approval End Date   07/28/2024  Patient Notification Method   Telephone Call  Telephone Call Outcome   Left Voicemail  Additional Outreach Contact   Provider  Contacted Provider   Message       07/09/2023 07/30/2023 08/05/2023  Outreach Medication Three  Initial Outreach Date (Medication Three) 07/09/2023    Manufacturer Medication Three Novo Nordisk    Nordisk Drugs Ozempic    Dose of Ozempic 2mg /32ml    Type of Radiographer, therapeutic Assistance    Date Application Sent to Patient 07/09/2023    Application Items Requested Application;Proof of Income;Other    Date Application Sent to Prescriber 07/09/2023    Name of Prescriber Norma [name]    Date Application Received From Patient  07/30/2023   Application Items Received From Patient  Application;Proof of Income;Other   Date Application Received From Provider  07/10/2023   Date Application Submitted to Manufacturer  07/30/2023   Method Application Sent to Manufacturer  Fax   Patient  Assistance Determination   Approved  Approval Start Date   07/31/2023  Approval End Date   04/14/2024  Patient Notification Method   Telephone Call  Additional Outreach Contact   Provider    Two care coordination calls placed to [employer]ofi and Novo Nordisk patient assistance programs.  Spoke to Norma [name] at [employer] who informs patient is APPROVED 07/30/2023-07/28/2024 for Toujeo  300units/ml Solostar. She informs a 90 days supply of medication will be shipped directly the the prescriber's office. She also informs subsequent shipments will be shipped out to prescriber's office automatically every 90 days. She informs patients will receive an automated phone call to inform them of their next shipment. They can also call Sanofi at [phone] to inquire about shipments. In this case , her initial shipment should arrive on 08/06/23 and the next shipment will start processing on 10/23/23.  Spoke to Norma [name] at [employer] who informs patient is APPROVED 07/31/23-04/14/24 for Ozempic. She informs patient will receive a 120 day supply of Ozempic which will be delivered to the prescriber's office in the next 10-14 business days barring any delays.  She informs if prescriber wishes to increase dose then a medication change form will need to be completed which is located on the [employer] website for prescribers. Subsequent shipments of Ozempic 0.5mg  will continue to process automatically and be delivered to the prescriber's office.  Unsuccessful outreach call placed to patient. HIPAA compliant voicemail left requesting a return call. Will also route this note to  Norma [name] Norma [name] as FYI and to inform him of case closure as patient assistance has been completed.  Allen Egerton, CPhT [place]  Office: [phone] Fax: [phone] Email: [name]

## 2023-08-05 NOTE — Telephone Encounter (Signed)
 Pt' medication list updated to show pt taking Ozempic in the place of Trulicity.

## 2023-08-06 ENCOUNTER — Telehealth: Payer: Self-pay | Admitting: "Endocrinology

## 2023-08-06 DIAGNOSIS — E11621 Type 2 diabetes mellitus with foot ulcer: Secondary | ICD-10-CM | POA: Diagnosis not present

## 2023-08-06 DIAGNOSIS — L97521 Non-pressure chronic ulcer of other part of left foot limited to breakdown of skin: Secondary | ICD-10-CM | POA: Diagnosis not present

## 2023-08-06 DIAGNOSIS — L84 Corns and callosities: Secondary | ICD-10-CM | POA: Diagnosis not present

## 2023-08-06 NOTE — Telephone Encounter (Signed)
 Called and let pt know pt assistance was here

## 2023-08-07 ENCOUNTER — Telehealth: Payer: Self-pay | Admitting: "Endocrinology

## 2023-08-07 DIAGNOSIS — E211 Secondary hyperparathyroidism, not elsewhere classified: Secondary | ICD-10-CM | POA: Diagnosis not present

## 2023-08-07 DIAGNOSIS — R809 Proteinuria, unspecified: Secondary | ICD-10-CM | POA: Diagnosis not present

## 2023-08-07 DIAGNOSIS — N189 Chronic kidney disease, unspecified: Secondary | ICD-10-CM | POA: Diagnosis not present

## 2023-08-07 DIAGNOSIS — D631 Anemia in chronic kidney disease: Secondary | ICD-10-CM | POA: Diagnosis not present

## 2023-08-07 NOTE — Telephone Encounter (Signed)
 Tried to return pt's call, left a message requesting a return call to the office.

## 2023-08-07 NOTE — Telephone Encounter (Signed)
 Pt would like you to call her per family member

## 2023-08-08 DIAGNOSIS — J449 Chronic obstructive pulmonary disease, unspecified: Secondary | ICD-10-CM | POA: Diagnosis not present

## 2023-08-08 DIAGNOSIS — L97521 Non-pressure chronic ulcer of other part of left foot limited to breakdown of skin: Secondary | ICD-10-CM | POA: Diagnosis not present

## 2023-08-08 DIAGNOSIS — E11621 Type 2 diabetes mellitus with foot ulcer: Secondary | ICD-10-CM | POA: Diagnosis not present

## 2023-08-08 DIAGNOSIS — I130 Hypertensive heart and chronic kidney disease with heart failure and stage 1 through stage 4 chronic kidney disease, or unspecified chronic kidney disease: Secondary | ICD-10-CM | POA: Diagnosis not present

## 2023-08-08 DIAGNOSIS — I509 Heart failure, unspecified: Secondary | ICD-10-CM | POA: Diagnosis not present

## 2023-08-08 DIAGNOSIS — N1832 Chronic kidney disease, stage 3b: Secondary | ICD-10-CM | POA: Diagnosis not present

## 2023-08-11 DIAGNOSIS — I509 Heart failure, unspecified: Secondary | ICD-10-CM | POA: Diagnosis not present

## 2023-08-11 DIAGNOSIS — I130 Hypertensive heart and chronic kidney disease with heart failure and stage 1 through stage 4 chronic kidney disease, or unspecified chronic kidney disease: Secondary | ICD-10-CM | POA: Diagnosis not present

## 2023-08-11 DIAGNOSIS — J449 Chronic obstructive pulmonary disease, unspecified: Secondary | ICD-10-CM | POA: Diagnosis not present

## 2023-08-11 DIAGNOSIS — N1832 Chronic kidney disease, stage 3b: Secondary | ICD-10-CM | POA: Diagnosis not present

## 2023-08-11 DIAGNOSIS — L97521 Non-pressure chronic ulcer of other part of left foot limited to breakdown of skin: Secondary | ICD-10-CM | POA: Diagnosis not present

## 2023-08-11 DIAGNOSIS — E11621 Type 2 diabetes mellitus with foot ulcer: Secondary | ICD-10-CM | POA: Diagnosis not present

## 2023-08-12 ENCOUNTER — Telehealth (INDEPENDENT_AMBULATORY_CARE_PROVIDER_SITE_OTHER): Payer: Self-pay | Admitting: *Deleted

## 2023-08-12 DIAGNOSIS — N1832 Chronic kidney disease, stage 3b: Secondary | ICD-10-CM | POA: Diagnosis not present

## 2023-08-12 DIAGNOSIS — I130 Hypertensive heart and chronic kidney disease with heart failure and stage 1 through stage 4 chronic kidney disease, or unspecified chronic kidney disease: Secondary | ICD-10-CM | POA: Diagnosis not present

## 2023-08-12 DIAGNOSIS — K760 Fatty (change of) liver, not elsewhere classified: Secondary | ICD-10-CM | POA: Diagnosis not present

## 2023-08-12 DIAGNOSIS — Z87891 Personal history of nicotine dependence: Secondary | ICD-10-CM | POA: Diagnosis not present

## 2023-08-12 DIAGNOSIS — E11621 Type 2 diabetes mellitus with foot ulcer: Secondary | ICD-10-CM | POA: Diagnosis not present

## 2023-08-12 DIAGNOSIS — I509 Heart failure, unspecified: Secondary | ICD-10-CM | POA: Diagnosis not present

## 2023-08-12 DIAGNOSIS — Z794 Long term (current) use of insulin: Secondary | ICD-10-CM | POA: Diagnosis not present

## 2023-08-12 DIAGNOSIS — Z7982 Long term (current) use of aspirin: Secondary | ICD-10-CM | POA: Diagnosis not present

## 2023-08-12 DIAGNOSIS — D631 Anemia in chronic kidney disease: Secondary | ICD-10-CM | POA: Diagnosis not present

## 2023-08-12 DIAGNOSIS — J449 Chronic obstructive pulmonary disease, unspecified: Secondary | ICD-10-CM | POA: Diagnosis not present

## 2023-08-12 DIAGNOSIS — Z9981 Dependence on supplemental oxygen: Secondary | ICD-10-CM | POA: Diagnosis not present

## 2023-08-12 DIAGNOSIS — E039 Hypothyroidism, unspecified: Secondary | ICD-10-CM | POA: Diagnosis not present

## 2023-08-12 DIAGNOSIS — D1724 Benign lipomatous neoplasm of skin and subcutaneous tissue of left leg: Secondary | ICD-10-CM | POA: Diagnosis not present

## 2023-08-12 DIAGNOSIS — Z8673 Personal history of transient ischemic attack (TIA), and cerebral infarction without residual deficits: Secondary | ICD-10-CM | POA: Diagnosis not present

## 2023-08-12 DIAGNOSIS — M109 Gout, unspecified: Secondary | ICD-10-CM | POA: Diagnosis not present

## 2023-08-12 DIAGNOSIS — F419 Anxiety disorder, unspecified: Secondary | ICD-10-CM | POA: Diagnosis not present

## 2023-08-12 DIAGNOSIS — F319 Bipolar disorder, unspecified: Secondary | ICD-10-CM | POA: Diagnosis not present

## 2023-08-12 DIAGNOSIS — I2510 Atherosclerotic heart disease of native coronary artery without angina pectoris: Secondary | ICD-10-CM | POA: Diagnosis not present

## 2023-08-12 DIAGNOSIS — E66813 Obesity, class 3: Secondary | ICD-10-CM | POA: Diagnosis not present

## 2023-08-12 DIAGNOSIS — Z7984 Long term (current) use of oral hypoglycemic drugs: Secondary | ICD-10-CM | POA: Diagnosis not present

## 2023-08-12 DIAGNOSIS — D509 Iron deficiency anemia, unspecified: Secondary | ICD-10-CM | POA: Diagnosis not present

## 2023-08-12 DIAGNOSIS — Z6841 Body Mass Index (BMI) 40.0 and over, adult: Secondary | ICD-10-CM | POA: Diagnosis not present

## 2023-08-12 DIAGNOSIS — Z604 Social exclusion and rejection: Secondary | ICD-10-CM | POA: Diagnosis not present

## 2023-08-12 DIAGNOSIS — E1142 Type 2 diabetes mellitus with diabetic polyneuropathy: Secondary | ICD-10-CM | POA: Diagnosis not present

## 2023-08-12 DIAGNOSIS — L97521 Non-pressure chronic ulcer of other part of left foot limited to breakdown of skin: Secondary | ICD-10-CM | POA: Diagnosis not present

## 2023-08-12 NOTE — Telephone Encounter (Signed)
 Discussed with patient per chelsea try ibgard and let us  know if not improved. She verbalized understanding.

## 2023-08-12 NOTE — Telephone Encounter (Signed)
 Patient called to see what she can use for gas. She was last seen in office on 07/10/23 and did not have any issues at that time for gas but states yesterday and today she has had gas so bad it has smelled up the whole house. She said she tried gas x in the past and it has not helped. She is having a little bloating but no pain she said. She said her diet has not changed so does not know what is causing the gas.   [phone]

## 2023-08-13 ENCOUNTER — Telehealth (HOSPITAL_COMMUNITY): Admitting: Psychiatry

## 2023-08-13 ENCOUNTER — Encounter (HOSPITAL_COMMUNITY): Payer: Self-pay | Admitting: Psychiatry

## 2023-08-13 DIAGNOSIS — E11621 Type 2 diabetes mellitus with foot ulcer: Secondary | ICD-10-CM | POA: Diagnosis not present

## 2023-08-13 DIAGNOSIS — E1140 Type 2 diabetes mellitus with diabetic neuropathy, unspecified: Secondary | ICD-10-CM | POA: Diagnosis not present

## 2023-08-13 DIAGNOSIS — L97521 Non-pressure chronic ulcer of other part of left foot limited to breakdown of skin: Secondary | ICD-10-CM | POA: Diagnosis not present

## 2023-08-13 DIAGNOSIS — I110 Hypertensive heart disease with heart failure: Secondary | ICD-10-CM | POA: Diagnosis not present

## 2023-08-13 DIAGNOSIS — F3131 Bipolar disorder, current episode depressed, mild: Secondary | ICD-10-CM

## 2023-08-13 DIAGNOSIS — I509 Heart failure, unspecified: Secondary | ICD-10-CM | POA: Diagnosis not present

## 2023-08-13 DIAGNOSIS — Z7982 Long term (current) use of aspirin: Secondary | ICD-10-CM | POA: Diagnosis not present

## 2023-08-13 DIAGNOSIS — F331 Major depressive disorder, recurrent, moderate: Secondary | ICD-10-CM | POA: Diagnosis not present

## 2023-08-13 DIAGNOSIS — J449 Chronic obstructive pulmonary disease, unspecified: Secondary | ICD-10-CM | POA: Diagnosis not present

## 2023-08-13 DIAGNOSIS — Z880 Allergy status to penicillin: Secondary | ICD-10-CM | POA: Diagnosis not present

## 2023-08-13 DIAGNOSIS — Z90710 Acquired absence of both cervix and uterus: Secondary | ICD-10-CM | POA: Diagnosis not present

## 2023-08-13 DIAGNOSIS — E039 Hypothyroidism, unspecified: Secondary | ICD-10-CM | POA: Diagnosis not present

## 2023-08-13 DIAGNOSIS — Z79899 Other long term (current) drug therapy: Secondary | ICD-10-CM | POA: Diagnosis not present

## 2023-08-13 DIAGNOSIS — Z87891 Personal history of nicotine dependence: Secondary | ICD-10-CM | POA: Diagnosis not present

## 2023-08-13 MED ORDER — LAMOTRIGINE 150 MG PO TABS: 150.0000 mg | ORAL_TABLET | Freq: Every day | ORAL | 2 refills | Status: DC

## 2023-08-13 MED ORDER — VENLAFAXINE HCL ER 150 MG PO CP24: ORAL_CAPSULE | ORAL | 2 refills | Status: DC

## 2023-08-13 MED ORDER — ARIPIPRAZOLE 5 MG PO TABS
5.0000 mg | ORAL_TABLET | Freq: Every day | ORAL | 2 refills | Status: DC
Start: 2023-08-13 — End: 2023-11-13

## 2023-08-13 MED ORDER — BUPROPION HCL ER (XL) 150 MG PO TB24: 150.0000 mg | ORAL_TABLET | Freq: Every morning | ORAL | 2 refills | Status: DC

## 2023-08-13 NOTE — Progress Notes (Signed)
 Virtual Visit via Telephone Note  I connected with Norma [name] on 08/13/23 at 11:20 AM EDT by telephone and verified that I am speaking with the correct person using two identifiers.  Location: Patient: home Provider: office   I discussed the limitations, risks, security and privacy concerns of performing an evaluation and management service by telephone and the availability of in person appointments. I also discussed with the patient that there may be a patient responsible charge related to this service. The patient expressed understanding and agreed to proceed.     I discussed the assessment and treatment plan with the patient. The patient was provided an opportunity to ask questions and all were answered. The patient agreed with the plan and demonstrated an understanding of the instructions.   The patient was advised to call back or seek an in-person evaluation if the symptoms worsen or if the condition fails to improve as anticipated.  I provided 20 minutes of non-face-to-face time during this encounter.   Norma Annas, MD  [facility] MD/PA/NP OP Progress Note  08/13/2023 11:32 AM Norma [name]  MRN:  [id number]  Chief Complaint:  Chief Complaint  Patient presents with   Depression   Anxiety   Follow-up   HPI: This patient is a 73 year old white female who is living with a roommate in [place].  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   The patient returns for follow-up after 3 months regarding her mood swings and depression.  The patient states she is doing fairly well.  She is had a diabetic foot ulcer that is very slow to heal but it is almost there.  She has to keep it immobilized most of the time.  She is no longer using the Xanax  and does not feel like she needs it.  She is still dealing with neuropathy as well.  She still has a roommate who recently had a baby and she is not sure how long she will stay but she is helping her out quite a bit around the house.  The  patient denies significant depression anxiety thoughts of self-harm or suicide racing thoughts or severe mood swings.  She still feels her medications are helpful. Visit Diagnosis:    ICD-10-CM   1. Bipolar affective disorder, currently depressed, mild (HCC)  F31.31     2. Moderate episode of recurrent major depressive disorder (HCC)  F33.1       Past Psychiatric History: She was hospitalized in 2001 and went through a course of ECT.  She has been tried on Serzone Effexor  Wellbutrin  and Zyprexa   Past Medical History:  Past Medical History:  Diagnosis Date   Anemia    Anxiety    Bipolar disorder (HCC)    Bulging lumbar disc    L3-4   Chronic daily headache    Chronic low back pain 09/20/2014   CKD (chronic kidney disease)    stage 3   COPD (chronic obstructive pulmonary disease) (HCC)    Coronary artery disease    CABG 2019   Degenerative arthritis    Depression    Diabetes mellitus, type II (HCC)    Diabetic neuropathy (HCC) 09/16/2018   DM type 2 with diabetic peripheral neuropathy (HCC) 09/20/2014   Dyslipidemia    Dyspnea    Family history of adverse reaction to anesthesia    father had reaction to anesthesia medication per patient "they don't use it anymore"- unsure of reaction   Fatty liver  per pt report   Gastroparesis    GERD (gastroesophageal reflux disease)    Heart murmur    History of hiatal hernia    pt states she no longer has this   Hypertension    Hypothyroidism    IBS (irritable bowel syndrome)    LBBB (left bundle branch block)    Memory difficulty 09/20/2014   Morbid obesity (HCC)    Myocardial infarction (HCC)    per pt- prior to CABG   Neuropathy    Obstructive sleep apnea on CPAP    Renal insufficiency    Restless legs syndrome (RLS) 09/17/2012   Stroke (cerebrum) (HCC) 05/16/2017   Left parietal   Urticaria     Past Surgical History:  Procedure Laterality Date   ABDOMINAL HYSTERECTOMY     ARTHROSCOPY KNEE W/ DRILLING  06/2011    and Decemer of 2013.   Carpel tunnel  1980's   CATARACT EXTRACTION W/PHACO Right 03/21/2017   Procedure: CATARACT EXTRACTION PHACO AND INTRAOCULAR LENS PLACEMENT RIGHT EYE;  Surgeon: Tarri Farm, MD;  Location: [facility];  Service: Ophthalmology;  Laterality: Right;  CDE: 2.91    CATARACT EXTRACTION W/PHACO Left 04/18/2017   Procedure: CATARACT EXTRACTION PHACO AND INTRAOCULAR LENS PLACEMENT LEFT EYE;  Surgeon: Tarri Farm, MD;  Location: [facility];  Service: Ophthalmology;  Laterality: Left;  left   CHOLECYSTECTOMY     COLONOSCOPY WITH PROPOFOL  N/A 10/04/2016   Procedure: COLONOSCOPY WITH PROPOFOL ;  Surgeon: Ruby Corporal, MD;  Location: [facility];  Service: Endoscopy;  Laterality: N/A;  11:10   CORONARY ARTERY BYPASS GRAFT N/A 08/29/2017   Procedure: CORONARY ARTERY BYPASS GRAFTING (CABG) x 3 WITH ENDOSCOPIC HARVESTING OF RIGHT SAPHENOUS VEIN;  Surgeon: Heriberto London, MD;  Location[facility] OR;  Service: Open Heart Surgery;  Laterality: N/A;   ESOPHAGOGASTRODUODENOSCOPY N/A 04/25/2016   Procedure: ESOPHAGOGASTRODUODENOSCOPY (EGD);  Surgeon: Ruby Corporal, MD;  Location: [facility];  Service: Endoscopy;  Laterality: N/A;  730   ESOPHAGOGASTRODUODENOSCOPY  03/2023   LEFT HEART CATH AND CORONARY ANGIOGRAPHY N/A 08/27/2017   Procedure: LEFT HEART CATH AND CORONARY ANGIOGRAPHY;  Surgeon: Arnoldo Lapping, MD;  Location: [facility] INVASIVE CV LAB;  Service: Cardiovascular::  pLAD 95% - p-mLAD 50%, ostD1 90% -pD1 80%; ostOM1 90%; rPDA 80%. EF ~50-55% - HK of dital Anterolateral & Apical wall.  - Rec CVTS c/s   NECK SURGERY     pt reports having growth removed from the back of her neck- after 2021   OPEN REDUCTION INTERNAL FIXATION (ORIF) HAND Left 04/10/2020   Procedure: OPEN REDUCTION INTERNAL FIXATION (ORIF) HAND, left index finger;  Surgeon: Darrin Emerald, MD;  Location: [facility];  Service: Orthopedics;  Laterality: Left;  0.045 k wires   RECTAL SURGERY     fissure   SHOULDER SURGERY Left     arthroscopy in March of this year   TEE WITHOUT CARDIOVERSION N/A 08/29/2017   Procedure: TRANSESOPHAGEAL ECHOCARDIOGRAM (TEE);  Surgeon: Matt Song, Donata Fryer, MD;  Location: [facility] OR;  Service: Open Heart Surgery;  Laterality: N/A;   TRANSFORAMINAL LUMBAR INTERBODY FUSION W/ MIS 1 LEVEL Right 07/12/2021   Procedure: Minimally Invasive Surgery Transforaminal Lumbar Interbody Fusion  Lumbar four-five;  Surgeon: Van Gelinas, MD;  Location: [facility] OR;  Service: Neurosurgery;  Laterality: Right;   TRANSTHORACIC ECHOCARDIOGRAM  06/06/2017   Mild to moderate reduced EF 40 and 45%.  Anterior septal, inferoseptal and basal to mid inferior hypokinesis.  GR 1 DD.  No significant valvular  lesion    Family Psychiatric History: See below  Family History:  Family History  Problem Relation Age of Onset   Hypertension Mother    Lymphoma Mother    Depression Mother    Arthritis Father    Alcohol abuse Father    Hypertension Sister    Cancer Brother        kidney and lung   Alcohol abuse Brother    Alcohol abuse Paternal Uncle    Alcohol abuse Paternal Grandfather    Alcohol abuse Paternal Grandmother    Allergic rhinitis Neg Hx    Angioedema Neg Hx    Asthma Neg Hx    Atopy Neg Hx    Eczema Neg Hx    Immunodeficiency Neg Hx    Urticaria Neg Hx     Social History:  Social History   Socioeconomic History   Marital status: Single    Spouse name: Not on file   Number of children: 0   Years of education: 16   Highest education level: Not on file  Occupational History    Employer: [employer]  Tobacco Use   Smoking status: Former    Current packs/day: 0.00    Types: Cigarettes    Quit date: 02/04/1971    Years since quitting: 52.5   Smokeless tobacco: Never   Tobacco comments:    smoked 2 cigarettes a day  Vaping Use   Vaping status: Never Used  Substance and Sexual Activity   Alcohol use: No    Alcohol/week: 0.0 standard drinks of alcohol   Drug use: No   Sexual  activity: Never  Other Topics Concern   Not on file  Social History Narrative   Patient lives at home alone.    Patient has no children.    Patient has her masters in nursing.    Patient is single.    Patient drinks about 2 glasses of tea daily.   Patient is right handed.   Social Drivers of Health   Financial Resource Strain: Medium Risk (12/18/2021)   Overall Financial Resource Strain (CARDIA)    Difficulty of Paying Living Expenses: Somewhat hard  Food Insecurity: No Food Insecurity (04/13/2023)   Hunger Vital Sign    Worried About Running Out of Food in the Last Year: Never true    Ran Out of Food in the Last Year: Never true  Transportation Needs: No Transportation Needs (04/13/2023)   PRAPARE - Administrator, Civil Service (Medical): No    Lack of Transportation (Non-Medical): No  Physical Activity: Not on file  Stress: No Stress Concern Present (10/04/2021)   [employer] of Occupational Health - Occupational Stress Questionnaire    Feeling of Stress : Only a little  Social Connections: Not on file    Allergies:  Allergies  Allergen Reactions   Amoxil [Amoxicillin] Anaphylaxis   Hydrocodone Anaphylaxis   Percocet [Oxycodone -Acetaminophen ] Other (See Comments)    Causes chest pain /tightness. Pressure pain around her ribs.   Depacon [Valproic Acid] Other (See Comments)    Causes falls    Prednisone Itching   Roxicodone  [Oxycodone ] Other (See Comments)    Chest pain, tightness    Metabolic Disorder Labs: Lab Results  Component Value Date   HGBA1C 7.7 04/22/2023   MPG 165.68 07/05/2021   MPG 148 03/05/2019   No results found for: "PROLACTIN" Lab Results  Component Value Date   CHOL 173 04/22/2023   TRIG 147 04/22/2023   HDL 41 04/22/2023  CHOLHDL 3.8 03/05/2019   VLDL 31 (H) 07/10/2016   LDLCALC 106 04/22/2023   LDLCALC 62 10/09/2022   Lab Results  Component Value Date   TSH 1.79 04/23/2023   TSH 1.79 04/22/2023    Therapeutic  Level Labs: No results found for: "LITHIUM" No results found for: "VALPROATE" No results found for: "CBMZ"  Current Medications: Current Outpatient Medications  Medication Sig Dispense Refill   acetaminophen  (TYLENOL ) 500 MG tablet Take 1,000 mg by mouth daily as needed for moderate pain (pain score 4-6), fever or headache.     ARIPiprazole  (ABILIFY ) 5 MG tablet Take 1 tablet (5 mg total) by mouth daily. 30 tablet 2   aspirin  EC 81 MG tablet Take 1 tablet (81 mg total) by mouth daily. 90 tablet 3   azelastine (ASTELIN) 0.1 % nasal spray Place 1 spray into both nostrils 2 (two) times daily as needed for allergies.     buPROPion  (WELLBUTRIN  XL) 150 MG 24 hr tablet Take 1 tablet (150 mg total) by mouth every morning. 30 tablet 2   carvedilol  (COREG ) 6.25 MG tablet Take 1 tablet (6.25 mg total) by mouth 2 (two) times daily with a meal. 180 tablet 2   Cholecalciferol  (VITAMIN D3) 50 MCG (2000 UT) capsule Take 1 capsule (2,000 Units total) by mouth daily with lunch. 90 capsule 3   diclofenac Sodium (VOLTAREN) 1 % GEL Apply 2 g topically 4 (four) times daily as needed (pain).     EASY TOUCH PEN NEEDLES 31G X 6 MM MISC USE AS DIRECTED with Toujeo  (she wants 6mm) 100 each 2   empagliflozin  (JARDIANCE ) 10 MG TABS tablet Take 5 mg daily (Patient taking differently: 10 mg.)     Evolocumab  (REPATHA  SURECLICK) 140 MG/ML SOAJ Inject 140 mg into the skin every 14 (fourteen) days. 6 mL 1   gabapentin  (NEURONTIN ) 400 MG capsule Take 400-800 mg by mouth See admin instructions. Take 1 capsule (400mg ) every morning and 2 capsules (800mg ) at bedtime.     insulin  glargine, 1 Unit Dial, (TOUJEO  SOLOSTAR) 300 UNIT/ML Solostar Pen Inject 50 Units into the skin at bedtime. 13.5 mL 2   lamoTRIgine  (LAMICTAL ) 150 MG tablet Take 1 tablet (150 mg total) by mouth at bedtime. 30 tablet 2   levothyroxine  (SYNTHROID ) 112 MCG tablet Take 112 mcg by mouth daily.     pantoprazole  (PROTONIX ) 40 MG tablet TAKE 1 TABLET BY MOUTH AT  BEDTIME 90 tablet 2   rOPINIRole  (REQUIP ) 3 MG tablet TAKE ONE TABLET BY MOUTH EVERY MORNING, ,NOON AND AT BEDTIME. (PRT PT MORNING,EVENING,BEDTIME) (Patient taking differently: Take 3-6 mg by mouth See admin instructions. Take 1 tablet (3mg ) every morning and 2 tablets (6mg ) at bedtime.) 270 tablet 3   Semaglutide,0.25 or 0.5MG /DOS, (OZEMPIC, 0.25 OR 0.5 MG/DOSE,) 2 MG/3ML SOPN Inject into the skin.     venlafaxine  XR (EFFEXOR -XR) 150 MG 24 hr capsule TAKE TWO (2) CAPSULES BY MOUTH AT BEDTIME 60 capsule 2   No current facility-administered medications for this visit.     Musculoskeletal: Strength & Muscle Tone: na Gait & Station: na Patient leans: N/A  Psychiatric Specialty Exam: Review of Systems  Musculoskeletal:  Positive for gait problem.  Skin:  Positive for wound.  Neurological:  Positive for numbness.  All other systems reviewed and are negative.   There were no vitals taken for this visit.There is no height or weight on file to calculate BMI.  General Appearance: NA  Eye Contact:  NA  Speech:  Clear and  Coherent  Volume:  Normal  Mood:  Euthymic  Affect:  Congruent  Thought Process:  Goal Directed  Orientation:  Full (Time, Place, and Person)  Thought Content: WDL   Suicidal Thoughts:  No  Homicidal Thoughts:  No  Memory:  Immediate;   Good Recent;   Good Remote;   Good  Judgement:  Good  Insight:  Good  Psychomotor Activity:  Decreased  Concentration:  Concentration: Good and Attention Span: Good  Recall:  Good  Fund of Knowledge: Good  Language: Good  Akathisia:  No  Handed:  Right  AIMS (if indicated): not done  Assets:  Communication Skills Desire for Improvement Resilience Social Support  ADL's:  Intact  Cognition: WNL  Sleep:  Fair   Screenings: Mini-Mental    Flowsheet Row Office Visit from 02/18/2018 in [facility] Office Visit from 09/17/2017 in [facility]  Total Score (max 30  points ) 28 28      PHQ2-9    Flowsheet Row Video Visit from 08/13/2022 in [facility] [place] Video Visit from 01/21/2022 in [facility] [place] Video Visit from 11/26/2021 in [facility] Coordination from 11/14/2021 in [facility] Patient Outreach Telephone from 10/04/2021 in [facility]  PHQ-2 Total Score 1 1 4 2 1   PHQ-9 Total Score -- -- 9 11 --      Flowsheet Row ED to Hosp-Admission (Discharged) from 04/13/2023 in [facility] Admission (Discharged) from 04/10/2023 in [facility] Video Visit from 08/13/2022 in [facility] [place]  C-SSRS RISK CATEGORY No Risk No Risk No Risk        Assessment and Plan: This patient is a 73 year old female with a history of depression anxiety mood swings and insomnia.  She is doing well on her current regimen.  We will discontinue Xanax  at her request.  She will continue Wellbutrin  XL 150 mg daily as well as Effexor  XR 300 mg daily for depression, Abilify  5 mg daily for augmentation and Lamictal  100 mg daily for mood stabilization.  She will return to see me in 3 months  Collaboration of Care: Collaboration of Care: Primary Care Provider AEB notes will be shared with PCP at patient's request  Patient/Guardian was advised Release of Information must be obtained prior to any record release in order to collaborate their care with an outside provider. Patient/Guardian was advised if they have not already done so to contact the registration department to sign all necessary forms in order for us  to release information regarding their care.   Consent: Patient/Guardian gives verbal consent for treatment and assignment of benefits for services provided during this visit. Patient/Guardian expressed  understanding and agreed to proceed.    Norma Annas, MD 08/13/2023, 11:32 AM

## 2023-08-14 NOTE — Telephone Encounter (Signed)
 Pt called and made aware her pt assistance is here for pick up

## 2023-08-15 ENCOUNTER — Other Ambulatory Visit: Payer: Self-pay | Admitting: Nurse Practitioner

## 2023-08-15 DIAGNOSIS — L97521 Non-pressure chronic ulcer of other part of left foot limited to breakdown of skin: Secondary | ICD-10-CM | POA: Diagnosis not present

## 2023-08-15 DIAGNOSIS — I130 Hypertensive heart and chronic kidney disease with heart failure and stage 1 through stage 4 chronic kidney disease, or unspecified chronic kidney disease: Secondary | ICD-10-CM | POA: Diagnosis not present

## 2023-08-15 DIAGNOSIS — I509 Heart failure, unspecified: Secondary | ICD-10-CM | POA: Diagnosis not present

## 2023-08-15 DIAGNOSIS — E11621 Type 2 diabetes mellitus with foot ulcer: Secondary | ICD-10-CM | POA: Diagnosis not present

## 2023-08-15 DIAGNOSIS — N1832 Chronic kidney disease, stage 3b: Secondary | ICD-10-CM | POA: Diagnosis not present

## 2023-08-15 DIAGNOSIS — J449 Chronic obstructive pulmonary disease, unspecified: Secondary | ICD-10-CM | POA: Diagnosis not present

## 2023-08-15 NOTE — Telephone Encounter (Signed)
 Pt picked up pt assistance.

## 2023-08-18 DIAGNOSIS — N1832 Chronic kidney disease, stage 3b: Secondary | ICD-10-CM | POA: Diagnosis not present

## 2023-08-18 DIAGNOSIS — J449 Chronic obstructive pulmonary disease, unspecified: Secondary | ICD-10-CM | POA: Diagnosis not present

## 2023-08-18 DIAGNOSIS — I130 Hypertensive heart and chronic kidney disease with heart failure and stage 1 through stage 4 chronic kidney disease, or unspecified chronic kidney disease: Secondary | ICD-10-CM | POA: Diagnosis not present

## 2023-08-18 DIAGNOSIS — E11621 Type 2 diabetes mellitus with foot ulcer: Secondary | ICD-10-CM | POA: Diagnosis not present

## 2023-08-18 DIAGNOSIS — I509 Heart failure, unspecified: Secondary | ICD-10-CM | POA: Diagnosis not present

## 2023-08-18 DIAGNOSIS — L97521 Non-pressure chronic ulcer of other part of left foot limited to breakdown of skin: Secondary | ICD-10-CM | POA: Diagnosis not present

## 2023-08-19 ENCOUNTER — Other Ambulatory Visit: Payer: Self-pay | Admitting: "Endocrinology

## 2023-08-20 DIAGNOSIS — I110 Hypertensive heart disease with heart failure: Secondary | ICD-10-CM | POA: Diagnosis not present

## 2023-08-20 DIAGNOSIS — J449 Chronic obstructive pulmonary disease, unspecified: Secondary | ICD-10-CM | POA: Diagnosis not present

## 2023-08-20 DIAGNOSIS — E11621 Type 2 diabetes mellitus with foot ulcer: Secondary | ICD-10-CM | POA: Diagnosis not present

## 2023-08-20 DIAGNOSIS — I509 Heart failure, unspecified: Secondary | ICD-10-CM | POA: Diagnosis not present

## 2023-08-20 DIAGNOSIS — E1140 Type 2 diabetes mellitus with diabetic neuropathy, unspecified: Secondary | ICD-10-CM | POA: Diagnosis not present

## 2023-08-20 DIAGNOSIS — L97521 Non-pressure chronic ulcer of other part of left foot limited to breakdown of skin: Secondary | ICD-10-CM | POA: Diagnosis not present

## 2023-08-22 DIAGNOSIS — L97521 Non-pressure chronic ulcer of other part of left foot limited to breakdown of skin: Secondary | ICD-10-CM | POA: Diagnosis not present

## 2023-08-22 DIAGNOSIS — I130 Hypertensive heart and chronic kidney disease with heart failure and stage 1 through stage 4 chronic kidney disease, or unspecified chronic kidney disease: Secondary | ICD-10-CM | POA: Diagnosis not present

## 2023-08-22 DIAGNOSIS — J449 Chronic obstructive pulmonary disease, unspecified: Secondary | ICD-10-CM | POA: Diagnosis not present

## 2023-08-22 DIAGNOSIS — N1832 Chronic kidney disease, stage 3b: Secondary | ICD-10-CM | POA: Diagnosis not present

## 2023-08-22 DIAGNOSIS — E11621 Type 2 diabetes mellitus with foot ulcer: Secondary | ICD-10-CM | POA: Diagnosis not present

## 2023-08-22 DIAGNOSIS — I509 Heart failure, unspecified: Secondary | ICD-10-CM | POA: Diagnosis not present

## 2023-08-25 DIAGNOSIS — E039 Hypothyroidism, unspecified: Secondary | ICD-10-CM | POA: Diagnosis not present

## 2023-08-25 DIAGNOSIS — G8929 Other chronic pain: Secondary | ICD-10-CM | POA: Diagnosis not present

## 2023-08-25 DIAGNOSIS — J449 Chronic obstructive pulmonary disease, unspecified: Secondary | ICD-10-CM | POA: Diagnosis not present

## 2023-08-25 DIAGNOSIS — M545 Low back pain, unspecified: Secondary | ICD-10-CM | POA: Diagnosis not present

## 2023-08-25 DIAGNOSIS — E11621 Type 2 diabetes mellitus with foot ulcer: Secondary | ICD-10-CM | POA: Diagnosis not present

## 2023-08-25 DIAGNOSIS — E782 Mixed hyperlipidemia: Secondary | ICD-10-CM | POA: Diagnosis not present

## 2023-08-25 DIAGNOSIS — K219 Gastro-esophageal reflux disease without esophagitis: Secondary | ICD-10-CM | POA: Diagnosis not present

## 2023-08-25 DIAGNOSIS — I509 Heart failure, unspecified: Secondary | ICD-10-CM | POA: Diagnosis not present

## 2023-08-25 DIAGNOSIS — I2510 Atherosclerotic heart disease of native coronary artery without angina pectoris: Secondary | ICD-10-CM | POA: Diagnosis not present

## 2023-08-25 DIAGNOSIS — F3181 Bipolar II disorder: Secondary | ICD-10-CM | POA: Diagnosis not present

## 2023-08-25 DIAGNOSIS — N1832 Chronic kidney disease, stage 3b: Secondary | ICD-10-CM | POA: Diagnosis not present

## 2023-08-25 DIAGNOSIS — D509 Iron deficiency anemia, unspecified: Secondary | ICD-10-CM | POA: Diagnosis not present

## 2023-08-25 DIAGNOSIS — J302 Other seasonal allergic rhinitis: Secondary | ICD-10-CM | POA: Diagnosis not present

## 2023-08-25 DIAGNOSIS — G2581 Restless legs syndrome: Secondary | ICD-10-CM | POA: Diagnosis not present

## 2023-08-25 DIAGNOSIS — I10 Essential (primary) hypertension: Secondary | ICD-10-CM | POA: Diagnosis not present

## 2023-08-25 DIAGNOSIS — I130 Hypertensive heart and chronic kidney disease with heart failure and stage 1 through stage 4 chronic kidney disease, or unspecified chronic kidney disease: Secondary | ICD-10-CM | POA: Diagnosis not present

## 2023-08-25 DIAGNOSIS — E1165 Type 2 diabetes mellitus with hyperglycemia: Secondary | ICD-10-CM | POA: Diagnosis not present

## 2023-08-25 DIAGNOSIS — L97521 Non-pressure chronic ulcer of other part of left foot limited to breakdown of skin: Secondary | ICD-10-CM | POA: Diagnosis not present

## 2023-08-26 DIAGNOSIS — E1129 Type 2 diabetes mellitus with other diabetic kidney complication: Secondary | ICD-10-CM | POA: Diagnosis not present

## 2023-08-26 DIAGNOSIS — N1832 Chronic kidney disease, stage 3b: Secondary | ICD-10-CM | POA: Diagnosis not present

## 2023-08-26 DIAGNOSIS — G4733 Obstructive sleep apnea (adult) (pediatric): Secondary | ICD-10-CM | POA: Diagnosis not present

## 2023-08-26 DIAGNOSIS — I129 Hypertensive chronic kidney disease with stage 1 through stage 4 chronic kidney disease, or unspecified chronic kidney disease: Secondary | ICD-10-CM | POA: Diagnosis not present

## 2023-08-27 DIAGNOSIS — I509 Heart failure, unspecified: Secondary | ICD-10-CM | POA: Diagnosis not present

## 2023-08-27 DIAGNOSIS — E1140 Type 2 diabetes mellitus with diabetic neuropathy, unspecified: Secondary | ICD-10-CM | POA: Diagnosis not present

## 2023-08-27 DIAGNOSIS — L97521 Non-pressure chronic ulcer of other part of left foot limited to breakdown of skin: Secondary | ICD-10-CM | POA: Diagnosis not present

## 2023-08-27 DIAGNOSIS — E11621 Type 2 diabetes mellitus with foot ulcer: Secondary | ICD-10-CM | POA: Diagnosis not present

## 2023-08-27 DIAGNOSIS — J449 Chronic obstructive pulmonary disease, unspecified: Secondary | ICD-10-CM | POA: Diagnosis not present

## 2023-08-27 DIAGNOSIS — I110 Hypertensive heart disease with heart failure: Secondary | ICD-10-CM | POA: Diagnosis not present

## 2023-08-28 ENCOUNTER — Encounter: Payer: Self-pay | Admitting: Nurse Practitioner

## 2023-08-28 ENCOUNTER — Ambulatory Visit: Admitting: Nurse Practitioner

## 2023-08-28 VITALS — BP 116/52 | HR 76 | Ht 60.0 in | Wt 258.0 lb

## 2023-08-28 DIAGNOSIS — G4733 Obstructive sleep apnea (adult) (pediatric): Secondary | ICD-10-CM | POA: Diagnosis not present

## 2023-08-28 DIAGNOSIS — E66813 Obesity, class 3: Secondary | ICD-10-CM | POA: Diagnosis not present

## 2023-08-28 DIAGNOSIS — Z6841 Body Mass Index (BMI) 40.0 and over, adult: Secondary | ICD-10-CM

## 2023-08-28 DIAGNOSIS — R0609 Other forms of dyspnea: Secondary | ICD-10-CM

## 2023-08-28 DIAGNOSIS — G4734 Idiopathic sleep related nonobstructive alveolar hypoventilation: Secondary | ICD-10-CM | POA: Diagnosis not present

## 2023-08-28 NOTE — Assessment & Plan Note (Signed)
BMI 50. Healthy weight loss encouraged 

## 2023-08-28 NOTE — Patient Instructions (Signed)
 Continue to use CPAP with oxygen  every night, minimum of 4-6 hours a night.  Change equipment as directed. Wash your tubing with warm soap and water  daily, hang to dry. Wash humidifier portion weekly. Use bottled, distilled water  and change daily Be aware of reduced alertness and do not drive or operate heavy machinery if experiencing this or drowsiness.  Exercise encouraged, as tolerated. Healthy weight management discussed.  Avoid or decrease alcohol consumption and medications that make you more sleepy, if possible. Notify if persistent daytime sleepiness occurs even with consistent use of PAP therapy.  Work on graded exercises as able    Follow up next March 2026 with Dr. Villa Greaser or Gina Lagos. If symptoms do not improve or worsen, please contact office for sooner follow up or seek emergency care.

## 2023-08-28 NOTE — Assessment & Plan Note (Signed)
 Mild OSA. Excellent compliance and control. Receives benefit from use. She is still having some moderate to large leaks but overall AHI improved with pressure change. She is not noticing leaks now either. Will hold off on any further changes. Aware of risks of untreated OSA. Understands proper care/use of device. Healthy weight loss encouraged. Safe driving practices reviewed.  Patient Instructions  Continue to use CPAP with oxygen  every night, minimum of 4-6 hours a night.  Change equipment as directed. Wash your tubing with warm soap and water  daily, hang to dry. Wash humidifier portion weekly. Use bottled, distilled water  and change daily Be aware of reduced alertness and do not drive or operate heavy machinery if experiencing this or drowsiness.  Exercise encouraged, as tolerated. Healthy weight management discussed.  Avoid or decrease alcohol consumption and medications that make you more sleepy, if possible. Notify if persistent daytime sleepiness occurs even with consistent use of PAP therapy.  Work on graded exercises as able    Follow up next March 2026 with Dr. Villa Greaser or Gina Lagos. If symptoms do not improve or worsen, please contact office for sooner follow up or seek emergency care.

## 2023-08-28 NOTE — Assessment & Plan Note (Signed)
 Felt to be multifactorial related to heart disease, body habitus and deconditioning. Euvolemic today. Prior PFTs with good lung function and no significant pulmonary disease on CT imaging. Encouraged to work on graded exercises.

## 2023-08-28 NOTE — Assessment & Plan Note (Signed)
 Continue supplemental oxygen  bled through CPAP at night. Goal>88-90%

## 2023-08-28 NOTE — Progress Notes (Signed)
 @Patient  ID: Norma [name], female    DOB: [birth year], 73 y.o.   MRN: [id number]  Chief Complaint  Patient presents with   Sleep Apnea    Referring provider: Omie Bickers, MD  HPI: 73 year female, former smoker followed for OSA on CPAP and nocturnal hypoxia. She is a patient of Dr. Hortense Lyons and last seen in office 07/03/2023 by [facility] NP. Past medical history significant for DM, HTN, CKD, spinal stenosis, bilateral TKR, CABG 2019, HFpEF, RLS.   TEST/EVENTS:  05/2016 CT chest: no ILD 04/2018 PFT: FVC 93, FEV1 102, ratio 84, TLC 100, DLCO 72 09/2019 NPSG: AHI 9.2/h, SpO2 low 75% 10/2019 CPAP titration >> CPAP 14 cmH2O with 2 lpm O2 06/2021 ONO on CPAP/ra >> desaturation for 4 hr  12/18/2022: OV with Dr. Villa Greaser. Hx of OSA maintained on CPAP. DME is [employer]. Started on oxygen  after sleep study in 2021; unclear cause of nocturnal hypoxia. Lung function appears okay. No evidence of ILD or pulm HTN. Replacement auto CPAP 10-15 cmH2O. Increased DOE; resumed torsemide  but has difficulties with dehydration with this. Weight stable. Breathing impacted by fluid retention.   07/03/2023: OV with Elliott Quade NP for follow-up.  She has been feeling better since she was here last from a breathing standpoint.  Still gets short winded with most activities and is relatively sedentary, which is baseline for her.  No significant cough or wheezing. No increased swelling or weight gain. She is sleeping with her CPAP every night.  Working well for her.  Feels well rested with use.  She does notice that it leaks sometimes.  No issues with drowsy driving, morning headaches or excessive daytime sleepiness. 06/02/2023-07/01/2023 CPAP 10-15 cmH2O 26/30 days; 83% greater than 4 hours; average use 6 hours 42 minutes Pressure 95th 12.2 Leaks 95th 57.8 AHI 6.2  08/28/2023: Today - follow up Patient presents today for follow up. At her last visit, we adjusted her CPAP pressures down to 8-12 cmH2O given high leaks and residual AHI 6.2/h. The  leaks were more noticeable to her. She tells me today she hasn't noticed them as much. Pressure change seems to have helped. She's not having any issues with her sleep. Feels well rested and sleeps well. She denies any drowsy driving or morning headaches.  Breathing is the same. Stable. No cough or wheezing  07/29/2023-08/27/2023 CPAP 8-12 cmH2O 30/30 days; 100% >4 hr; average use 8 hr  Pressure 95th 10.8 Leaks 54.7 AHI 5.1  Allergies  Allergen Reactions   Amoxil [Amoxicillin] Anaphylaxis   Hydrocodone Anaphylaxis   Percocet [Oxycodone -Acetaminophen ] Other (See Comments)    Causes chest pain /tightness. Pressure pain around her ribs.   Depacon [Valproic Acid] Other (See Comments)    Causes falls    Prednisone Itching   Roxicodone  Alaire.Amsler ] Other (See Comments)    Chest pain, tightness    Immunization History  Administered Date(s) Administered    Astrazeneca Covid-19 Vaccine, Pf, 0.5 Ml Non Us   01/29/2023   Influenza Inj Mdck Quad Pf 02/12/2016   Influenza,inj,Quad PF,6+ Mos 01/20/2018   Influenza,inj,quad, With Preservative 04/25/2022   Influenza-Unspecified 01/29/2023   Janssen (J&J) SARS-COV-2 Vaccination 03/17/2020   PNEUMOCOCCAL CONJUGATE-20 05/01/2023   Pneumococcal Polysaccharide-23 01/30/2015   Td,absorbed, Preservative Free, Adult Use, Lf Unspecified 07/31/1999   Tdap 07/31/1999, 04/08/2020    Past Medical History:  Diagnosis Date   Anemia    Anxiety    Bipolar disorder (HCC)    Bulging lumbar disc    L3-4   Chronic daily  headache    Chronic low back pain 09/20/2014   CKD (chronic kidney disease)    stage 3   COPD (chronic obstructive pulmonary disease) (HCC)    Coronary artery disease    CABG 2019   Degenerative arthritis    Depression    Diabetes mellitus, type II (HCC)    Diabetic neuropathy (HCC) 09/16/2018   DM type 2 with diabetic peripheral neuropathy (HCC) 09/20/2014   Dyslipidemia    Dyspnea    Family history of adverse reaction to anesthesia     father had reaction to anesthesia medication per patient "they don't use it anymore"- unsure of reaction   Fatty liver    per pt report   Gastroparesis    GERD (gastroesophageal reflux disease)    Heart murmur    History of hiatal hernia    pt states she no longer has this   Hypertension    Hypothyroidism    IBS (irritable bowel syndrome)    LBBB (left bundle branch block)    Memory difficulty 09/20/2014   Morbid obesity (HCC)    Myocardial infarction (HCC)    per pt- prior to CABG   Neuropathy    Obstructive sleep apnea on CPAP    Renal insufficiency    Restless legs syndrome (RLS) 09/17/2012   Stroke (cerebrum) (HCC) 05/16/2017   Left parietal   Urticaria     Tobacco History: Social History   Tobacco Use  Smoking Status Former   Current packs/day: 0.00   Types: Cigarettes   Quit date: 02/04/1971   Years since quitting: 52.5  Smokeless Tobacco Never  Tobacco Comments   smoked 2 cigarettes a day   Counseling given: Not Answered Tobacco comments: smoked 2 cigarettes a day   Outpatient Medications Prior to Visit  Medication Sig Dispense Refill   acetaminophen  (TYLENOL ) 500 MG tablet Take 1,000 mg by mouth daily as needed for moderate pain (pain score 4-6), fever or headache.     ALPRAZolam  (XANAX ) 0.25 MG tablet TAKE 1 TABLET BY MOUTH AT BEDTIME AS NEEDED FOR ANXIETY OR FOR SLEEP     ARIPiprazole  (ABILIFY ) 5 MG tablet Take 1 tablet (5 mg total) by mouth daily. 30 tablet 2   aspirin  EC 81 MG tablet Take 1 tablet (81 mg total) by mouth daily. 90 tablet 3   azelastine (ASTELIN) 0.1 % nasal spray Place 1 spray into both nostrils 2 (two) times daily as needed for allergies.     buPROPion  (WELLBUTRIN  XL) 150 MG 24 hr tablet Take 1 tablet (150 mg total) by mouth every morning. 30 tablet 2   carvedilol  (COREG ) 6.25 MG tablet Take 1 tablet (6.25 mg total) by mouth 2 (two) times daily with a meal. 180 tablet 2   Cholecalciferol  (VITAMIN D3) 50 MCG (2000 UT) capsule Take 1  capsule (2,000 Units total) by mouth daily with lunch. 90 capsule 3   diclofenac Sodium (VOLTAREN) 1 % GEL Apply 2 g topically 4 (four) times daily as needed (pain).     EASY TOUCH PEN NEEDLES 31G X 6 MM MISC USE AS DIRECTED with Toujeo  (she wants 6mm) 100 each 2   empagliflozin  (JARDIANCE ) 10 MG TABS tablet Take 5 mg daily (Patient taking differently: 10 mg.)     EPINEPHrine  0.3 mg/0.3 mL IJ SOAJ injection Inject 0.3 mg into the muscle as needed.     Evolocumab  (REPATHA  SURECLICK) 140 MG/ML SOAJ Inject 140 mg into the skin every 14 (fourteen) days. 6 mL 1   gabapentin  (  NEURONTIN ) 400 MG capsule Take 400-800 mg by mouth See admin instructions. Take 1 capsule (400mg ) every morning and 2 capsules (800mg ) at bedtime.     insulin  glargine, 1 Unit Dial, (TOUJEO  SOLOSTAR) 300 UNIT/ML Solostar Pen Inject 50 Units into the skin at bedtime. 13.5 mL 2   lamoTRIgine  (LAMICTAL ) 150 MG tablet Take 1 tablet (150 mg total) by mouth at bedtime. 30 tablet 2   levothyroxine  (SYNTHROID ) 112 MCG tablet Take 112 mcg by mouth daily.     losartan  (COZAAR ) 25 MG tablet Take 25 mg by mouth daily.     pantoprazole  (PROTONIX ) 40 MG tablet TAKE 1 TABLET BY MOUTH AT BEDTIME 90 tablet 2   rOPINIRole  (REQUIP ) 3 MG tablet TAKE ONE TABLET BY MOUTH EVERY MORNING, ,NOON AND AT BEDTIME. (PRT PT MORNING,EVENING,BEDTIME) (Patient taking differently: Take 3-6 mg by mouth See admin instructions. Take 1 tablet (3mg ) every morning and 2 tablets (6mg ) at bedtime.) 270 tablet 3   Semaglutide,0.25 or 0.5MG /DOS, (OZEMPIC, 0.25 OR 0.5 MG/DOSE,) 2 MG/3ML SOPN Inject into the skin.     tiZANidine  (ZANAFLEX ) 2 MG tablet Take 2 mg by mouth 2 (two) times daily.     triamcinolone  cream (KENALOG) 0.1 % SMARTSIG:sparingly Topical Twice Daily     venlafaxine  XR (EFFEXOR -XR) 150 MG 24 hr capsule TAKE TWO (2) CAPSULES BY MOUTH AT BEDTIME 60 capsule 2   No facility-administered medications prior to visit.     Review of Systems:   Constitutional: No  weight loss or gain, night sweats, fevers, chills, or lassitude. +fatigue (baseline) HEENT: No headaches, difficulty swallowing, tooth/dental problems, or sore throat. No sneezing, itching, ear ache, nasal congestion, or post nasal drip CV:  No chest pain, orthopnea, PND, swelling in lower extremities, anasarca, dizziness, palpitations, syncope Resp: +baseline shortness of breath with exertion. No excess mucus or change in color of mucus. No productive or non-productive. No hemoptysis. No wheezing.  No chest wall deformity GI:  No heartburn, indigestion GU: No nocturia  Neuro: No dizziness or lightheadedness.  Psych: No depression or anxiety. Mood stable.     Physical Exam:  BP (!) 116/52   Pulse 76   Ht 5' (1.524 m)   Wt 258 lb (117 kg)   SpO2 94%   BMI 50.39 kg/m   GEN: Pleasant, interactive, well-appearing; morbidly obese; in no acute distress HEENT:  Normocephalic and atraumatic. PERRLA. Sclera white. Nasal turbinates pink, moist and patent bilaterally. No rhinorrhea present. Oropharynx pink and moist, without exudate or edema. No lesions, ulcerations, or postnasal drip.  NECK:  Supple w/ fair ROM. No lymphadenopathy.   CV: RRR, no m/r/g, no peripheral edema. Pulses intact, +2 bilaterally. No cyanosis, pallor or clubbing. PULMONARY:  Unlabored, regular breathing. Clear bilaterally A&P w/o wheezes/rales/rhonchi. No accessory muscle use.  GI: BS present and normoactive. Soft, non-tender to palpation. No organomegaly or masses detected.  MSK: No erythema, warmth or tenderness. Cap refil <2 sec all extrem.  Neuro: A/Ox3. No focal deficits noted.   Skin: Warm, no lesions or rashe Psych: Normal affect and behavior. Judgement and thought content appropriate.     Lab Results:  CBC    Component Value Date/Time   WBC 11.5 (H) 04/14/2023 0449   RBC 4.19 04/14/2023 0449   HGB 13.1 04/14/2023 0449   HCT 39.7 04/14/2023 0449   PLT 215 04/14/2023 0449   MCV 94.7 04/14/2023 0449    MCH 31.3 04/14/2023 0449   MCHC 33.0 04/14/2023 0449   RDW 13.1 04/14/2023 0449  LYMPHSABS 1.4 04/10/2023 0846   MONOABS 0.8 04/10/2023 0846   EOSABS 0.4 04/10/2023 0846   BASOSABS 0.1 04/10/2023 0846    BMET    Component Value Date/Time   NA 138 04/22/2023 0000   K 5.0 04/22/2023 0000   CL 99 04/22/2023 0000   CO2 26 (A) 04/22/2023 0000   GLUCOSE 186 (H) 04/14/2023 0449   BUN 24 (A) 04/22/2023 0000   CREATININE 1.5 (A) 04/22/2023 0000   CREATININE 1.36 (H) 04/14/2023 0449   CREATININE 1.10 (H) 03/05/2019 1221   CALCIUM  9.4 04/22/2023 0000   GFRNONAA 42 (L) 04/14/2023 0449   GFRNONAA 23 10/09/2022 0000   GFRNONAA 53 (L) 03/05/2019 1221   GFRAA 56 (L) 04/23/2019 1511   GFRAA 61 03/05/2019 1221    BNP    Component Value Date/Time   BNP 26.9 09/10/2022 1036     Imaging:  No results found.  Administration History     None          Latest Ref Rng & Units 05/06/2018    2:53 PM 08/28/2017   12:14 PM  PFT Results  FVC-Pre L 2.97  2.93   FVC-Predicted Pre % 93  92   FVC-Post L 2.89  2.82   FVC-Predicted Post % 91  88   Pre FEV1/FVC % % 84  82   Post FEV1/FCV % % 84  87   FEV1-Pre L 2.48  2.40   FEV1-Predicted Pre % 102  98   FEV1-Post L 2.44  2.45   DLCO uncorrected ml/min/mmHg 17.63  21.01   DLCO UNC% % 72  86   DLVA Predicted % 84  96   TLC L 5.08  4.72   TLC % Predicted % 100  93   RV % Predicted % 100  83     No results found for: "NITRICOXIDE"      Assessment & Plan:   Obstructive sleep apnea syndrome Mild OSA. Excellent compliance and control. Receives benefit from use. She is still having some moderate to large leaks but overall AHI improved with pressure change. She is not noticing leaks now either. Will hold off on any further changes. Aware of risks of untreated OSA. Understands proper care/use of device. Healthy weight loss encouraged. Safe driving practices reviewed.  Patient Instructions  Continue to use CPAP with oxygen  every night,  minimum of 4-6 hours a night.  Change equipment as directed. Wash your tubing with warm soap and water  daily, hang to dry. Wash humidifier portion weekly. Use bottled, distilled water  and change daily Be aware of reduced alertness and do not drive or operate heavy machinery if experiencing this or drowsiness.  Exercise encouraged, as tolerated. Healthy weight management discussed.  Avoid or decrease alcohol consumption and medications that make you more sleepy, if possible. Notify if persistent daytime sleepiness occurs even with consistent use of PAP therapy.  Work on graded exercises as able    Follow up next March 2026 with Dr. Villa Greaser or Gina Lagos. If symptoms do not improve or worsen, please contact office for sooner follow up or seek emergency care.    Sleep related hypoxia Continue supplemental oxygen  bled through CPAP at night. Goal>88-90%  DOE (dyspnea on exertion) Felt to be multifactorial related to heart disease, body habitus and deconditioning. Euvolemic today. Prior PFTs with good lung function and no significant pulmonary disease on CT imaging. Encouraged to work on graded exercises.   Class 3 severe obesity with body mass index (BMI) of 50.0  to 59.9 in adult BMI 50. Healthy weight loss encouraged     Advised if symptoms do not improve or worsen, to please contact office for sooner follow up or seek emergency care.   I spent 35 minutes of dedicated to the care of this patient on the date of this encounter to include pre-visit review of records, face-to-face time with the patient discussing conditions above, post visit ordering of testing, clinical documentation with the electronic health record, making appropriate referrals as documented, and communicating necessary findings to members of the patients care team.  Roetta Clarke, NP 08/28/2023  Pt aware and understands NP's role.

## 2023-08-29 DIAGNOSIS — E11621 Type 2 diabetes mellitus with foot ulcer: Secondary | ICD-10-CM | POA: Diagnosis not present

## 2023-08-29 DIAGNOSIS — I130 Hypertensive heart and chronic kidney disease with heart failure and stage 1 through stage 4 chronic kidney disease, or unspecified chronic kidney disease: Secondary | ICD-10-CM | POA: Diagnosis not present

## 2023-08-29 DIAGNOSIS — J449 Chronic obstructive pulmonary disease, unspecified: Secondary | ICD-10-CM | POA: Diagnosis not present

## 2023-08-29 DIAGNOSIS — I509 Heart failure, unspecified: Secondary | ICD-10-CM | POA: Diagnosis not present

## 2023-08-29 DIAGNOSIS — N1832 Chronic kidney disease, stage 3b: Secondary | ICD-10-CM | POA: Diagnosis not present

## 2023-08-29 DIAGNOSIS — L97521 Non-pressure chronic ulcer of other part of left foot limited to breakdown of skin: Secondary | ICD-10-CM | POA: Diagnosis not present

## 2023-09-01 DIAGNOSIS — I509 Heart failure, unspecified: Secondary | ICD-10-CM | POA: Diagnosis not present

## 2023-09-01 DIAGNOSIS — I130 Hypertensive heart and chronic kidney disease with heart failure and stage 1 through stage 4 chronic kidney disease, or unspecified chronic kidney disease: Secondary | ICD-10-CM | POA: Diagnosis not present

## 2023-09-01 DIAGNOSIS — E11621 Type 2 diabetes mellitus with foot ulcer: Secondary | ICD-10-CM | POA: Diagnosis not present

## 2023-09-01 DIAGNOSIS — J449 Chronic obstructive pulmonary disease, unspecified: Secondary | ICD-10-CM | POA: Diagnosis not present

## 2023-09-01 DIAGNOSIS — N1832 Chronic kidney disease, stage 3b: Secondary | ICD-10-CM | POA: Diagnosis not present

## 2023-09-01 DIAGNOSIS — L97521 Non-pressure chronic ulcer of other part of left foot limited to breakdown of skin: Secondary | ICD-10-CM | POA: Diagnosis not present

## 2023-09-04 DIAGNOSIS — E11621 Type 2 diabetes mellitus with foot ulcer: Secondary | ICD-10-CM | POA: Diagnosis not present

## 2023-09-04 DIAGNOSIS — I509 Heart failure, unspecified: Secondary | ICD-10-CM | POA: Diagnosis not present

## 2023-09-04 DIAGNOSIS — I130 Hypertensive heart and chronic kidney disease with heart failure and stage 1 through stage 4 chronic kidney disease, or unspecified chronic kidney disease: Secondary | ICD-10-CM | POA: Diagnosis not present

## 2023-09-04 DIAGNOSIS — J449 Chronic obstructive pulmonary disease, unspecified: Secondary | ICD-10-CM | POA: Diagnosis not present

## 2023-09-04 DIAGNOSIS — L97521 Non-pressure chronic ulcer of other part of left foot limited to breakdown of skin: Secondary | ICD-10-CM | POA: Diagnosis not present

## 2023-09-04 DIAGNOSIS — N1832 Chronic kidney disease, stage 3b: Secondary | ICD-10-CM | POA: Diagnosis not present

## 2023-09-09 DIAGNOSIS — K219 Gastro-esophageal reflux disease without esophagitis: Secondary | ICD-10-CM | POA: Diagnosis not present

## 2023-09-09 DIAGNOSIS — E11621 Type 2 diabetes mellitus with foot ulcer: Secondary | ICD-10-CM | POA: Diagnosis not present

## 2023-09-09 DIAGNOSIS — J449 Chronic obstructive pulmonary disease, unspecified: Secondary | ICD-10-CM | POA: Diagnosis not present

## 2023-09-09 DIAGNOSIS — J309 Allergic rhinitis, unspecified: Secondary | ICD-10-CM | POA: Diagnosis not present

## 2023-09-09 DIAGNOSIS — S31109D Unspecified open wound of abdominal wall, unspecified quadrant without penetration into peritoneal cavity, subsequent encounter: Secondary | ICD-10-CM | POA: Diagnosis not present

## 2023-09-09 DIAGNOSIS — L97521 Non-pressure chronic ulcer of other part of left foot limited to breakdown of skin: Secondary | ICD-10-CM | POA: Diagnosis not present

## 2023-09-09 DIAGNOSIS — R251 Tremor, unspecified: Secondary | ICD-10-CM | POA: Diagnosis not present

## 2023-09-09 DIAGNOSIS — I509 Heart failure, unspecified: Secondary | ICD-10-CM | POA: Diagnosis not present

## 2023-09-09 DIAGNOSIS — N1832 Chronic kidney disease, stage 3b: Secondary | ICD-10-CM | POA: Diagnosis not present

## 2023-09-09 DIAGNOSIS — I130 Hypertensive heart and chronic kidney disease with heart failure and stage 1 through stage 4 chronic kidney disease, or unspecified chronic kidney disease: Secondary | ICD-10-CM | POA: Diagnosis not present

## 2023-09-10 DIAGNOSIS — Z880 Allergy status to penicillin: Secondary | ICD-10-CM | POA: Diagnosis not present

## 2023-09-10 DIAGNOSIS — E1140 Type 2 diabetes mellitus with diabetic neuropathy, unspecified: Secondary | ICD-10-CM | POA: Diagnosis not present

## 2023-09-10 DIAGNOSIS — Z90710 Acquired absence of both cervix and uterus: Secondary | ICD-10-CM | POA: Diagnosis not present

## 2023-09-10 DIAGNOSIS — L84 Corns and callosities: Secondary | ICD-10-CM | POA: Diagnosis not present

## 2023-09-10 DIAGNOSIS — Z79899 Other long term (current) drug therapy: Secondary | ICD-10-CM | POA: Diagnosis not present

## 2023-09-10 DIAGNOSIS — M1990 Unspecified osteoarthritis, unspecified site: Secondary | ICD-10-CM | POA: Diagnosis not present

## 2023-09-10 DIAGNOSIS — Z7982 Long term (current) use of aspirin: Secondary | ICD-10-CM | POA: Diagnosis not present

## 2023-09-10 DIAGNOSIS — E039 Hypothyroidism, unspecified: Secondary | ICD-10-CM | POA: Diagnosis not present

## 2023-09-10 DIAGNOSIS — M109 Gout, unspecified: Secondary | ICD-10-CM | POA: Diagnosis not present

## 2023-09-10 DIAGNOSIS — Z951 Presence of aortocoronary bypass graft: Secondary | ICD-10-CM | POA: Diagnosis not present

## 2023-09-10 DIAGNOSIS — L97521 Non-pressure chronic ulcer of other part of left foot limited to breakdown of skin: Secondary | ICD-10-CM | POA: Diagnosis not present

## 2023-09-10 DIAGNOSIS — I110 Hypertensive heart disease with heart failure: Secondary | ICD-10-CM | POA: Diagnosis not present

## 2023-09-10 DIAGNOSIS — L603 Nail dystrophy: Secondary | ICD-10-CM | POA: Diagnosis not present

## 2023-09-10 DIAGNOSIS — Z87891 Personal history of nicotine dependence: Secondary | ICD-10-CM | POA: Diagnosis not present

## 2023-09-10 DIAGNOSIS — I509 Heart failure, unspecified: Secondary | ICD-10-CM | POA: Diagnosis not present

## 2023-09-10 DIAGNOSIS — J449 Chronic obstructive pulmonary disease, unspecified: Secondary | ICD-10-CM | POA: Diagnosis not present

## 2023-09-10 DIAGNOSIS — Z885 Allergy status to narcotic agent status: Secondary | ICD-10-CM | POA: Diagnosis not present

## 2023-09-10 DIAGNOSIS — E11621 Type 2 diabetes mellitus with foot ulcer: Secondary | ICD-10-CM | POA: Diagnosis not present

## 2023-09-11 DIAGNOSIS — E1122 Type 2 diabetes mellitus with diabetic chronic kidney disease: Secondary | ICD-10-CM | POA: Diagnosis not present

## 2023-09-11 DIAGNOSIS — D1724 Benign lipomatous neoplasm of skin and subcutaneous tissue of left leg: Secondary | ICD-10-CM | POA: Diagnosis not present

## 2023-09-11 DIAGNOSIS — Z6841 Body Mass Index (BMI) 40.0 and over, adult: Secondary | ICD-10-CM | POA: Diagnosis not present

## 2023-09-11 DIAGNOSIS — Z794 Long term (current) use of insulin: Secondary | ICD-10-CM | POA: Diagnosis not present

## 2023-09-11 DIAGNOSIS — I2510 Atherosclerotic heart disease of native coronary artery without angina pectoris: Secondary | ICD-10-CM | POA: Diagnosis not present

## 2023-09-11 DIAGNOSIS — F319 Bipolar disorder, unspecified: Secondary | ICD-10-CM | POA: Diagnosis not present

## 2023-09-11 DIAGNOSIS — E66813 Obesity, class 3: Secondary | ICD-10-CM | POA: Diagnosis not present

## 2023-09-11 DIAGNOSIS — L97521 Non-pressure chronic ulcer of other part of left foot limited to breakdown of skin: Secondary | ICD-10-CM | POA: Diagnosis not present

## 2023-09-11 DIAGNOSIS — N1832 Chronic kidney disease, stage 3b: Secondary | ICD-10-CM | POA: Diagnosis not present

## 2023-09-11 DIAGNOSIS — Z7982 Long term (current) use of aspirin: Secondary | ICD-10-CM | POA: Diagnosis not present

## 2023-09-11 DIAGNOSIS — K760 Fatty (change of) liver, not elsewhere classified: Secondary | ICD-10-CM | POA: Diagnosis not present

## 2023-09-11 DIAGNOSIS — Z604 Social exclusion and rejection: Secondary | ICD-10-CM | POA: Diagnosis not present

## 2023-09-11 DIAGNOSIS — F419 Anxiety disorder, unspecified: Secondary | ICD-10-CM | POA: Diagnosis not present

## 2023-09-11 DIAGNOSIS — Z7984 Long term (current) use of oral hypoglycemic drugs: Secondary | ICD-10-CM | POA: Diagnosis not present

## 2023-09-11 DIAGNOSIS — Z87891 Personal history of nicotine dependence: Secondary | ICD-10-CM | POA: Diagnosis not present

## 2023-09-11 DIAGNOSIS — M109 Gout, unspecified: Secondary | ICD-10-CM | POA: Diagnosis not present

## 2023-09-11 DIAGNOSIS — J449 Chronic obstructive pulmonary disease, unspecified: Secondary | ICD-10-CM | POA: Diagnosis not present

## 2023-09-11 DIAGNOSIS — I509 Heart failure, unspecified: Secondary | ICD-10-CM | POA: Diagnosis not present

## 2023-09-11 DIAGNOSIS — D631 Anemia in chronic kidney disease: Secondary | ICD-10-CM | POA: Diagnosis not present

## 2023-09-11 DIAGNOSIS — E11621 Type 2 diabetes mellitus with foot ulcer: Secondary | ICD-10-CM | POA: Diagnosis not present

## 2023-09-11 DIAGNOSIS — Z9981 Dependence on supplemental oxygen: Secondary | ICD-10-CM | POA: Diagnosis not present

## 2023-09-11 DIAGNOSIS — I130 Hypertensive heart and chronic kidney disease with heart failure and stage 1 through stage 4 chronic kidney disease, or unspecified chronic kidney disease: Secondary | ICD-10-CM | POA: Diagnosis not present

## 2023-09-11 DIAGNOSIS — E039 Hypothyroidism, unspecified: Secondary | ICD-10-CM | POA: Diagnosis not present

## 2023-09-11 DIAGNOSIS — D509 Iron deficiency anemia, unspecified: Secondary | ICD-10-CM | POA: Diagnosis not present

## 2023-09-11 DIAGNOSIS — E1142 Type 2 diabetes mellitus with diabetic polyneuropathy: Secondary | ICD-10-CM | POA: Diagnosis not present

## 2023-09-12 ENCOUNTER — Ambulatory Visit: Payer: Medicare Other | Admitting: Cardiology

## 2023-09-12 DIAGNOSIS — I130 Hypertensive heart and chronic kidney disease with heart failure and stage 1 through stage 4 chronic kidney disease, or unspecified chronic kidney disease: Secondary | ICD-10-CM | POA: Diagnosis not present

## 2023-09-12 DIAGNOSIS — N1832 Chronic kidney disease, stage 3b: Secondary | ICD-10-CM | POA: Diagnosis not present

## 2023-09-12 DIAGNOSIS — E11621 Type 2 diabetes mellitus with foot ulcer: Secondary | ICD-10-CM | POA: Diagnosis not present

## 2023-09-12 DIAGNOSIS — E1122 Type 2 diabetes mellitus with diabetic chronic kidney disease: Secondary | ICD-10-CM | POA: Diagnosis not present

## 2023-09-12 DIAGNOSIS — L97521 Non-pressure chronic ulcer of other part of left foot limited to breakdown of skin: Secondary | ICD-10-CM | POA: Diagnosis not present

## 2023-09-12 DIAGNOSIS — I509 Heart failure, unspecified: Secondary | ICD-10-CM | POA: Diagnosis not present

## 2023-09-13 DIAGNOSIS — E782 Mixed hyperlipidemia: Secondary | ICD-10-CM | POA: Diagnosis not present

## 2023-09-13 DIAGNOSIS — E1165 Type 2 diabetes mellitus with hyperglycemia: Secondary | ICD-10-CM | POA: Diagnosis not present

## 2023-09-13 DIAGNOSIS — F3181 Bipolar II disorder: Secondary | ICD-10-CM | POA: Diagnosis not present

## 2023-09-13 DIAGNOSIS — I10 Essential (primary) hypertension: Secondary | ICD-10-CM | POA: Diagnosis not present

## 2023-09-15 ENCOUNTER — Telehealth: Payer: Self-pay | Admitting: Cardiology

## 2023-09-15 DIAGNOSIS — L97521 Non-pressure chronic ulcer of other part of left foot limited to breakdown of skin: Secondary | ICD-10-CM | POA: Diagnosis not present

## 2023-09-15 DIAGNOSIS — I509 Heart failure, unspecified: Secondary | ICD-10-CM | POA: Diagnosis not present

## 2023-09-15 DIAGNOSIS — N1832 Chronic kidney disease, stage 3b: Secondary | ICD-10-CM | POA: Diagnosis not present

## 2023-09-15 DIAGNOSIS — E1122 Type 2 diabetes mellitus with diabetic chronic kidney disease: Secondary | ICD-10-CM | POA: Diagnosis not present

## 2023-09-15 DIAGNOSIS — I130 Hypertensive heart and chronic kidney disease with heart failure and stage 1 through stage 4 chronic kidney disease, or unspecified chronic kidney disease: Secondary | ICD-10-CM | POA: Diagnosis not present

## 2023-09-15 DIAGNOSIS — E11621 Type 2 diabetes mellitus with foot ulcer: Secondary | ICD-10-CM | POA: Diagnosis not present

## 2023-09-15 NOTE — Telephone Encounter (Signed)
 Pt c/o medication issue:  1. Name of Medication: carvedilol  (COREG ) 6.25 MG tablet   2. How are you currently taking this medication (dosage and times per day)? As nwritten  3. Are you having a reaction (difficulty breathing--STAT)? Bo   4. What is your medication issue? Hand tremors wants  to switch pt to Inderal. Please advise

## 2023-09-15 NOTE — Telephone Encounter (Signed)
 MyChart message sent to patient.

## 2023-09-15 NOTE — Telephone Encounter (Signed)
 Patient NP [name] wants to switch from carvedilol  to inderal due to patient having hand tremors.  Patient states the tremors just started about a month ago  Patient has been on carvedilol  since 2019  Inderal is treatment of choice she states and she cannot take both medications together

## 2023-09-17 ENCOUNTER — Telehealth: Payer: Self-pay | Admitting: Cardiology

## 2023-09-17 DIAGNOSIS — L97521 Non-pressure chronic ulcer of other part of left foot limited to breakdown of skin: Secondary | ICD-10-CM | POA: Diagnosis not present

## 2023-09-17 DIAGNOSIS — I110 Hypertensive heart disease with heart failure: Secondary | ICD-10-CM | POA: Diagnosis not present

## 2023-09-17 DIAGNOSIS — E1140 Type 2 diabetes mellitus with diabetic neuropathy, unspecified: Secondary | ICD-10-CM | POA: Diagnosis not present

## 2023-09-17 DIAGNOSIS — J449 Chronic obstructive pulmonary disease, unspecified: Secondary | ICD-10-CM | POA: Diagnosis not present

## 2023-09-17 DIAGNOSIS — I509 Heart failure, unspecified: Secondary | ICD-10-CM | POA: Diagnosis not present

## 2023-09-17 DIAGNOSIS — E11621 Type 2 diabetes mellitus with foot ulcer: Secondary | ICD-10-CM | POA: Diagnosis not present

## 2023-09-17 NOTE — Telephone Encounter (Signed)
 Patient is calling in about getting an EKG schedule. Please advise

## 2023-09-19 DIAGNOSIS — R251 Tremor, unspecified: Secondary | ICD-10-CM | POA: Diagnosis not present

## 2023-09-19 DIAGNOSIS — I130 Hypertensive heart and chronic kidney disease with heart failure and stage 1 through stage 4 chronic kidney disease, or unspecified chronic kidney disease: Secondary | ICD-10-CM | POA: Diagnosis not present

## 2023-09-19 DIAGNOSIS — K219 Gastro-esophageal reflux disease without esophagitis: Secondary | ICD-10-CM | POA: Diagnosis not present

## 2023-09-19 DIAGNOSIS — S31109D Unspecified open wound of abdominal wall, unspecified quadrant without penetration into peritoneal cavity, subsequent encounter: Secondary | ICD-10-CM | POA: Diagnosis not present

## 2023-09-19 DIAGNOSIS — I5032 Chronic diastolic (congestive) heart failure: Secondary | ICD-10-CM | POA: Diagnosis not present

## 2023-09-19 DIAGNOSIS — N1832 Chronic kidney disease, stage 3b: Secondary | ICD-10-CM | POA: Diagnosis not present

## 2023-09-19 DIAGNOSIS — J309 Allergic rhinitis, unspecified: Secondary | ICD-10-CM | POA: Diagnosis not present

## 2023-09-19 DIAGNOSIS — E11621 Type 2 diabetes mellitus with foot ulcer: Secondary | ICD-10-CM | POA: Diagnosis not present

## 2023-09-19 DIAGNOSIS — I10 Essential (primary) hypertension: Secondary | ICD-10-CM | POA: Diagnosis not present

## 2023-09-19 DIAGNOSIS — E1122 Type 2 diabetes mellitus with diabetic chronic kidney disease: Secondary | ICD-10-CM | POA: Diagnosis not present

## 2023-09-19 DIAGNOSIS — I509 Heart failure, unspecified: Secondary | ICD-10-CM | POA: Diagnosis not present

## 2023-09-19 DIAGNOSIS — E1165 Type 2 diabetes mellitus with hyperglycemia: Secondary | ICD-10-CM | POA: Diagnosis not present

## 2023-09-19 DIAGNOSIS — L97521 Non-pressure chronic ulcer of other part of left foot limited to breakdown of skin: Secondary | ICD-10-CM | POA: Diagnosis not present

## 2023-09-22 ENCOUNTER — Ambulatory Visit

## 2023-09-22 DIAGNOSIS — N1832 Chronic kidney disease, stage 3b: Secondary | ICD-10-CM | POA: Diagnosis not present

## 2023-09-22 DIAGNOSIS — E1122 Type 2 diabetes mellitus with diabetic chronic kidney disease: Secondary | ICD-10-CM | POA: Diagnosis not present

## 2023-09-22 DIAGNOSIS — E11621 Type 2 diabetes mellitus with foot ulcer: Secondary | ICD-10-CM | POA: Diagnosis not present

## 2023-09-22 DIAGNOSIS — I130 Hypertensive heart and chronic kidney disease with heart failure and stage 1 through stage 4 chronic kidney disease, or unspecified chronic kidney disease: Secondary | ICD-10-CM | POA: Diagnosis not present

## 2023-09-22 DIAGNOSIS — L97521 Non-pressure chronic ulcer of other part of left foot limited to breakdown of skin: Secondary | ICD-10-CM | POA: Diagnosis not present

## 2023-09-22 DIAGNOSIS — I509 Heart failure, unspecified: Secondary | ICD-10-CM | POA: Diagnosis not present

## 2023-09-23 DIAGNOSIS — M79675 Pain in left toe(s): Secondary | ICD-10-CM | POA: Diagnosis not present

## 2023-09-23 DIAGNOSIS — M79674 Pain in right toe(s): Secondary | ICD-10-CM | POA: Diagnosis not present

## 2023-09-23 DIAGNOSIS — E1142 Type 2 diabetes mellitus with diabetic polyneuropathy: Secondary | ICD-10-CM | POA: Diagnosis not present

## 2023-09-23 DIAGNOSIS — B351 Tinea unguium: Secondary | ICD-10-CM | POA: Diagnosis not present

## 2023-09-23 DIAGNOSIS — L84 Corns and callosities: Secondary | ICD-10-CM | POA: Diagnosis not present

## 2023-09-25 ENCOUNTER — Ambulatory Visit: Attending: Internal Medicine

## 2023-09-25 DIAGNOSIS — I5032 Chronic diastolic (congestive) heart failure: Secondary | ICD-10-CM | POA: Insufficient documentation

## 2023-09-25 NOTE — Progress Notes (Signed)
 Patient here for an EKG per Dr.Branch  EKZ preformed and sent to provider for review

## 2023-09-26 DIAGNOSIS — E11621 Type 2 diabetes mellitus with foot ulcer: Secondary | ICD-10-CM | POA: Diagnosis not present

## 2023-09-26 DIAGNOSIS — E1122 Type 2 diabetes mellitus with diabetic chronic kidney disease: Secondary | ICD-10-CM | POA: Diagnosis not present

## 2023-09-26 DIAGNOSIS — I509 Heart failure, unspecified: Secondary | ICD-10-CM | POA: Diagnosis not present

## 2023-09-26 DIAGNOSIS — I130 Hypertensive heart and chronic kidney disease with heart failure and stage 1 through stage 4 chronic kidney disease, or unspecified chronic kidney disease: Secondary | ICD-10-CM | POA: Diagnosis not present

## 2023-09-26 DIAGNOSIS — L97521 Non-pressure chronic ulcer of other part of left foot limited to breakdown of skin: Secondary | ICD-10-CM | POA: Diagnosis not present

## 2023-09-26 DIAGNOSIS — N1832 Chronic kidney disease, stage 3b: Secondary | ICD-10-CM | POA: Diagnosis not present

## 2023-09-28 DIAGNOSIS — R000 Tachycardia, unspecified: Secondary | ICD-10-CM | POA: Diagnosis not present

## 2023-09-28 DIAGNOSIS — R0902 Hypoxemia: Secondary | ICD-10-CM | POA: Diagnosis not present

## 2023-09-28 DIAGNOSIS — R69 Illness, unspecified: Secondary | ICD-10-CM | POA: Diagnosis not present

## 2023-09-29 DIAGNOSIS — I509 Heart failure, unspecified: Secondary | ICD-10-CM | POA: Diagnosis not present

## 2023-09-29 DIAGNOSIS — E11621 Type 2 diabetes mellitus with foot ulcer: Secondary | ICD-10-CM | POA: Diagnosis not present

## 2023-09-29 DIAGNOSIS — E1122 Type 2 diabetes mellitus with diabetic chronic kidney disease: Secondary | ICD-10-CM | POA: Diagnosis not present

## 2023-09-29 DIAGNOSIS — L97521 Non-pressure chronic ulcer of other part of left foot limited to breakdown of skin: Secondary | ICD-10-CM | POA: Diagnosis not present

## 2023-09-29 DIAGNOSIS — N1832 Chronic kidney disease, stage 3b: Secondary | ICD-10-CM | POA: Diagnosis not present

## 2023-09-29 DIAGNOSIS — I130 Hypertensive heart and chronic kidney disease with heart failure and stage 1 through stage 4 chronic kidney disease, or unspecified chronic kidney disease: Secondary | ICD-10-CM | POA: Diagnosis not present

## 2023-10-02 DIAGNOSIS — L97521 Non-pressure chronic ulcer of other part of left foot limited to breakdown of skin: Secondary | ICD-10-CM | POA: Diagnosis not present

## 2023-10-02 DIAGNOSIS — I130 Hypertensive heart and chronic kidney disease with heart failure and stage 1 through stage 4 chronic kidney disease, or unspecified chronic kidney disease: Secondary | ICD-10-CM | POA: Diagnosis not present

## 2023-10-02 DIAGNOSIS — N1832 Chronic kidney disease, stage 3b: Secondary | ICD-10-CM | POA: Diagnosis not present

## 2023-10-02 DIAGNOSIS — E1122 Type 2 diabetes mellitus with diabetic chronic kidney disease: Secondary | ICD-10-CM | POA: Diagnosis not present

## 2023-10-02 DIAGNOSIS — E11621 Type 2 diabetes mellitus with foot ulcer: Secondary | ICD-10-CM | POA: Diagnosis not present

## 2023-10-02 DIAGNOSIS — I509 Heart failure, unspecified: Secondary | ICD-10-CM | POA: Diagnosis not present

## 2023-10-06 DIAGNOSIS — L97521 Non-pressure chronic ulcer of other part of left foot limited to breakdown of skin: Secondary | ICD-10-CM | POA: Diagnosis not present

## 2023-10-06 DIAGNOSIS — I130 Hypertensive heart and chronic kidney disease with heart failure and stage 1 through stage 4 chronic kidney disease, or unspecified chronic kidney disease: Secondary | ICD-10-CM | POA: Diagnosis not present

## 2023-10-06 DIAGNOSIS — N1832 Chronic kidney disease, stage 3b: Secondary | ICD-10-CM | POA: Diagnosis not present

## 2023-10-06 DIAGNOSIS — I509 Heart failure, unspecified: Secondary | ICD-10-CM | POA: Diagnosis not present

## 2023-10-06 DIAGNOSIS — E1122 Type 2 diabetes mellitus with diabetic chronic kidney disease: Secondary | ICD-10-CM | POA: Diagnosis not present

## 2023-10-06 DIAGNOSIS — E11621 Type 2 diabetes mellitus with foot ulcer: Secondary | ICD-10-CM | POA: Diagnosis not present

## 2023-10-07 ENCOUNTER — Telehealth: Payer: Self-pay | Admitting: Cardiology

## 2023-10-07 NOTE — Telephone Encounter (Signed)
-----   Message from Alvan Carrier sent at 10/07/2023  1:07 PM EDT ----- EKG looks fine  JINNY Alvan MD ----- Message ----- From: Myriam Fine Sent: 09/25/2023   2:29 PM EDT To: Carrier JULIANNA Alvan, MD

## 2023-10-07 NOTE — Telephone Encounter (Signed)
 Patient informed and verbalized understanding of plan.

## 2023-10-08 DIAGNOSIS — E11621 Type 2 diabetes mellitus with foot ulcer: Secondary | ICD-10-CM | POA: Diagnosis not present

## 2023-10-08 DIAGNOSIS — L97521 Non-pressure chronic ulcer of other part of left foot limited to breakdown of skin: Secondary | ICD-10-CM | POA: Diagnosis not present

## 2023-10-08 DIAGNOSIS — I110 Hypertensive heart disease with heart failure: Secondary | ICD-10-CM | POA: Diagnosis not present

## 2023-10-08 DIAGNOSIS — E1140 Type 2 diabetes mellitus with diabetic neuropathy, unspecified: Secondary | ICD-10-CM | POA: Diagnosis not present

## 2023-10-08 DIAGNOSIS — I509 Heart failure, unspecified: Secondary | ICD-10-CM | POA: Diagnosis not present

## 2023-10-08 DIAGNOSIS — J449 Chronic obstructive pulmonary disease, unspecified: Secondary | ICD-10-CM | POA: Diagnosis not present

## 2023-10-09 ENCOUNTER — Encounter: Payer: Self-pay | Admitting: "Endocrinology

## 2023-10-09 ENCOUNTER — Ambulatory Visit (INDEPENDENT_AMBULATORY_CARE_PROVIDER_SITE_OTHER): Payer: Medicare Other | Admitting: "Endocrinology

## 2023-10-09 VITALS — BP 112/58 | HR 60 | Ht 60.0 in | Wt 253.6 lb

## 2023-10-09 DIAGNOSIS — G8929 Other chronic pain: Secondary | ICD-10-CM | POA: Diagnosis not present

## 2023-10-09 DIAGNOSIS — E785 Hyperlipidemia, unspecified: Secondary | ICD-10-CM | POA: Diagnosis not present

## 2023-10-09 DIAGNOSIS — E038 Other specified hypothyroidism: Secondary | ICD-10-CM

## 2023-10-09 DIAGNOSIS — I10 Essential (primary) hypertension: Secondary | ICD-10-CM

## 2023-10-09 DIAGNOSIS — Z794 Long term (current) use of insulin: Secondary | ICD-10-CM | POA: Diagnosis not present

## 2023-10-09 DIAGNOSIS — E559 Vitamin D deficiency, unspecified: Secondary | ICD-10-CM

## 2023-10-09 DIAGNOSIS — M5441 Lumbago with sciatica, right side: Secondary | ICD-10-CM | POA: Diagnosis not present

## 2023-10-09 DIAGNOSIS — E1159 Type 2 diabetes mellitus with other circulatory complications: Secondary | ICD-10-CM | POA: Diagnosis not present

## 2023-10-09 DIAGNOSIS — M5442 Lumbago with sciatica, left side: Secondary | ICD-10-CM | POA: Diagnosis not present

## 2023-10-09 DIAGNOSIS — M25562 Pain in left knee: Secondary | ICD-10-CM | POA: Diagnosis not present

## 2023-10-09 DIAGNOSIS — E1169 Type 2 diabetes mellitus with other specified complication: Secondary | ICD-10-CM | POA: Diagnosis not present

## 2023-10-09 DIAGNOSIS — Z9181 History of falling: Secondary | ICD-10-CM | POA: Diagnosis not present

## 2023-10-09 MED ORDER — SEMAGLUTIDE (1 MG/DOSE) 4 MG/3ML ~~LOC~~ SOPN: 1.0000 mg | PEN_INJECTOR | SUBCUTANEOUS | 0 refills | Status: DC

## 2023-10-09 NOTE — Progress Notes (Signed)
 10/09/2023, 10:21 AM   Endocrinology follow-up note  Subjective:    Patient ID: Norma [name], female    DOB: 1951/01/18.  Norma [name] is being seen in follow-up after she was seen in consultation for management of currently uncontrolled symptomatic diabetes requested by  Shona Norleen PEDLAR, MD.   Past Medical History:  Diagnosis Date   Anemia    Anxiety    Bipolar disorder (HCC)    Bulging lumbar disc    L3-4   Chronic daily headache    Chronic low back pain 09/20/2014   CKD (chronic kidney disease)    stage 3   COPD (chronic obstructive pulmonary disease) (HCC)    Coronary artery disease    CABG 2019   Degenerative arthritis    Depression    Diabetes mellitus, type II (HCC)    Diabetic neuropathy (HCC) 09/16/2018   DM type 2 with diabetic peripheral neuropathy (HCC) 09/20/2014   Dyslipidemia    Dyspnea    Family history of adverse reaction to anesthesia    father had reaction to anesthesia medication per patient they don't use it anymore- unsure of reaction   Fatty liver    per pt report   Gastroparesis    GERD (gastroesophageal reflux disease)    Heart murmur    History of hiatal hernia    pt states she no longer has this   Hypertension    Hypothyroidism    IBS (irritable bowel syndrome)    LBBB (left bundle branch block)    Memory difficulty 09/20/2014   Morbid obesity (HCC)    Myocardial infarction (HCC)    per pt- prior to CABG   Neuropathy    Obstructive sleep apnea on CPAP    Renal insufficiency    Restless legs syndrome (RLS) 09/17/2012   Stroke (cerebrum) (HCC) 05/16/2017   Left parietal   Urticaria     Past Surgical History:  Procedure Laterality Date   ABDOMINAL HYSTERECTOMY     ARTHROSCOPY KNEE W/ DRILLING  06/2011   and Decemer of 2013.   Carpel tunnel  1980's   CATARACT EXTRACTION W/PHACO Right 03/21/2017   Procedure: CATARACT EXTRACTION PHACO AND  INTRAOCULAR LENS PLACEMENT RIGHT EYE;  Surgeon: Harrie Agent, MD;  Location: [facility];  Service: Ophthalmology;  Laterality: Right;  CDE: 2.91    CATARACT EXTRACTION W/PHACO Left 04/18/2017   Procedure: CATARACT EXTRACTION PHACO AND INTRAOCULAR LENS PLACEMENT LEFT EYE;  Surgeon: Harrie Agent, MD;  Location: [facility];  Service: Ophthalmology;  Laterality: Left;  left   CHOLECYSTECTOMY     COLONOSCOPY WITH PROPOFOL  N/A 10/04/2016   Procedure: COLONOSCOPY WITH PROPOFOL ;  Surgeon: Golda Claudis PENNER, MD;  Location: [facility];  Service: Endoscopy;  Laterality: N/A;  11:10   CORONARY ARTERY BYPASS GRAFT N/A 08/29/2017   Procedure: CORONARY ARTERY BYPASS GRAFTING (CABG) x 3 WITH ENDOSCOPIC HARVESTING OF RIGHT SAPHENOUS VEIN;  Surgeon: Fleeta Hanford Coy, MD;  Location: [facility] OR;  Service: Open Heart Surgery;  Laterality: N/A;   ESOPHAGOGASTRODUODENOSCOPY N/A 04/25/2016   Procedure: ESOPHAGOGASTRODUODENOSCOPY (EGD);  Surgeon: Claudis PENNER Golda, MD;  Location:  [facility];  Service: Endoscopy;  Laterality: N/A;  730   ESOPHAGOGASTRODUODENOSCOPY  03/2023   LEFT HEART CATH AND CORONARY ANGIOGRAPHY N/A 08/27/2017   Procedure: LEFT HEART CATH AND CORONARY ANGIOGRAPHY;  Surgeon: Wonda Sharper, MD;  Location: [facility] INVASIVE CV LAB;  Service: Cardiovascular::  pLAD 95% - p-mLAD 50%, ostD1 90% -pD1 80%; ostOM1 90%; rPDA 80%. EF ~50-55% - HK of dital Anterolateral & Apical wall.  - Rec CVTS c/s   NECK SURGERY     pt reports having growth removed from the back of her neck- after 2021   OPEN REDUCTION INTERNAL FIXATION (ORIF) HAND Left 04/10/2020   Procedure: OPEN REDUCTION INTERNAL FIXATION (ORIF) HAND, left index finger;  Surgeon: Margrette Taft BRAVO, MD;  Location: [facility];  Service: Orthopedics;  Laterality: Left;  0.045 k wires   RECTAL SURGERY     fissure   SHOULDER SURGERY Left    arthroscopy in March of this year   TEE WITHOUT CARDIOVERSION N/A 08/29/2017   Procedure: TRANSESOPHAGEAL ECHOCARDIOGRAM (TEE);   Surgeon: Fleeta Ochoa, Maude, MD;  Location: [facility] OR;  Service: Open Heart Surgery;  Laterality: N/A;   TRANSFORAMINAL LUMBAR INTERBODY FUSION W/ MIS 1 LEVEL Right 07/12/2021   Procedure: Minimally Invasive Surgery Transforaminal Lumbar Interbody Fusion  Lumbar four-five;  Surgeon: Debby Dorn MATSU, MD;  Location: [facility] OR;  Service: Neurosurgery;  Laterality: Right;   TRANSTHORACIC ECHOCARDIOGRAM  06/06/2017   Mild to moderate reduced EF 40 and 45%.  Anterior septal, inferoseptal and basal to mid inferior hypokinesis.  GR 1 DD.  No significant valvular lesion    Social History   Socioeconomic History   Marital status: Single    Spouse name: Not on file   Number of children: 0   Years of education: 16   Highest education level: Not on file  Occupational History    Employer: [employer]  Tobacco Use   Smoking status: Former    Current packs/day: 0.00    Types: Cigarettes    Quit date: 02/04/1971    Years since quitting: 52.7   Smokeless tobacco: Never   Tobacco comments:    smoked 2 cigarettes a day  Vaping Use   Vaping status: Never Used  Substance and Sexual Activity   Alcohol use: No    Alcohol/week: 0.0 standard drinks of alcohol   Drug use: No   Sexual activity: Never  Other Topics Concern   Not on file  Social History Narrative   Patient lives at home alone.    Patient has no children.    Patient has her masters in nursing.    Patient is single.    Patient drinks about 2 glasses of tea daily.   Patient is right handed.   Social Drivers of Health   Financial Resource Strain: Medium Risk (12/18/2021)   Overall Financial Resource Strain (CARDIA)    Difficulty of Paying Living Expenses: Somewhat hard  Food Insecurity: No Food Insecurity (04/13/2023)   Hunger Vital Sign    Worried About Running Out of Food in the Last Year: Never true    Ran Out of Food in the Last Year: Never true  Transportation Needs: No Transportation Needs (04/13/2023)   PRAPARE -  Administrator, Civil Service (Medical): No    Lack of Transportation (Non-Medical): No  Physical Activity: Not on file  Stress: No Stress Concern Present (10/04/2021)   [employer] of Occupational Health - Occupational Stress Questionnaire    Feeling of Stress :  Only a little  Social Connections: Not on file    Family History  Problem Relation Age of Onset   Hypertension Mother    Lymphoma Mother    Depression Mother    Arthritis Father    Alcohol abuse Father    Hypertension Sister    Cancer Brother        kidney and lung   Alcohol abuse Brother    Alcohol abuse Paternal Uncle    Alcohol abuse Paternal Grandfather    Alcohol abuse Paternal Grandmother    Allergic rhinitis Neg Hx    Angioedema Neg Hx    Asthma Neg Hx    Atopy Neg Hx    Eczema Neg Hx    Immunodeficiency Neg Hx    Urticaria Neg Hx     Outpatient Encounter Medications as of 10/09/2023  Medication Sig   cetirizine  (ZYRTEC ) 10 MG chewable tablet Chew 10 mg by mouth daily.   Semaglutide, 1 MG/DOSE, 4 MG/3ML SOPN Inject 1 mg as directed once a week.   [DISCONTINUED] Semaglutide,0.25 or 0.5MG /DOS, (OZEMPIC, 0.25 OR 0.5 MG/DOSE,) 2 MG/3ML SOPN Inject into the skin.   acetaminophen  (TYLENOL ) 500 MG tablet Take 1,000 mg by mouth daily as needed for moderate pain (pain score 4-6), fever or headache.   ALPRAZolam  (XANAX ) 0.25 MG tablet TAKE 1 TABLET BY MOUTH AT BEDTIME AS NEEDED FOR ANXIETY OR FOR SLEEP   ARIPiprazole  (ABILIFY ) 5 MG tablet Take 1 tablet (5 mg total) by mouth daily.   aspirin  EC 81 MG tablet Take 1 tablet (81 mg total) by mouth daily.   azelastine (ASTELIN) 0.1 % nasal spray Place 1 spray into both nostrils 2 (two) times daily as needed for allergies.   buPROPion  (WELLBUTRIN  XL) 150 MG 24 hr tablet Take 1 tablet (150 mg total) by mouth every morning.   Cholecalciferol  (VITAMIN D3) 50 MCG (2000 UT) capsule Take 1 capsule (2,000 Units total) by mouth daily with lunch.   diclofenac Sodium  (VOLTAREN) 1 % GEL Apply 2 g topically 4 (four) times daily as needed (pain).   EASY TOUCH PEN NEEDLES 31G X 6 MM MISC USE AS DIRECTED with Toujeo  (she wants 6mm)   empagliflozin  (JARDIANCE ) 10 MG TABS tablet Take 5 mg daily (Patient taking differently: 10 mg.)   EPINEPHrine  0.3 mg/0.3 mL IJ SOAJ injection Inject 0.3 mg into the muscle as needed.   Evolocumab  (REPATHA  SURECLICK) 140 MG/ML SOAJ Inject 140 mg into the skin every 14 (fourteen) days.   gabapentin  (NEURONTIN ) 400 MG capsule Take 400-800 mg by mouth See admin instructions. Take 1 capsule (400mg ) every morning and 2 capsules (800mg ) at bedtime.   insulin  glargine, 1 Unit Dial, (TOUJEO  SOLOSTAR) 300 UNIT/ML Solostar Pen Inject 50 Units into the skin at bedtime.   lamoTRIgine  (LAMICTAL ) 150 MG tablet Take 1 tablet (150 mg total) by mouth at bedtime.   levothyroxine  (SYNTHROID ) 112 MCG tablet Take 112 mcg by mouth daily.   losartan  (COZAAR ) 25 MG tablet Take 25 mg by mouth daily.   pantoprazole  (PROTONIX ) 40 MG tablet TAKE 1 TABLET BY MOUTH AT BEDTIME   propranolol (INDERAL) 10 MG tablet Take 10 mg by mouth 2 (two) times daily.   rOPINIRole  (REQUIP ) 3 MG tablet TAKE ONE TABLET BY MOUTH EVERY MORNING, ,NOON AND AT BEDTIME. (PRT PT MORNING,EVENING,BEDTIME) (Patient taking differently: Take 3-6 mg by mouth See admin instructions. Take 1 tablet (3mg ) every morning and 2 tablets (6mg ) at bedtime.)   tiZANidine  (ZANAFLEX ) 2 MG tablet Take 2  mg by mouth 2 (two) times daily.   triamcinolone  cream (KENALOG) 0.1 % SMARTSIG:sparingly Topical Twice Daily   venlafaxine  XR (EFFEXOR -XR) 150 MG 24 hr capsule TAKE TWO (2) CAPSULES BY MOUTH AT BEDTIME   [DISCONTINUED] carvedilol  (COREG ) 6.25 MG tablet Take 1 tablet (6.25 mg total) by mouth 2 (two) times daily with a meal. (Patient not taking: Reported on 10/09/2023)   No facility-administered encounter medications on file as of 10/09/2023.    ALLERGIES: Allergies  Allergen Reactions   Amoxil [Amoxicillin]  Anaphylaxis   Hydrocodone Anaphylaxis   Percocet [Oxycodone -Acetaminophen ] Other (See Comments)    Causes chest pain /tightness. Pressure pain around her ribs.   Depacon [Valproic Acid] Other (See Comments)    Causes falls    Prednisone Itching   Roxicodone  [Oxycodone ] Other (See Comments)    Chest pain, tightness    VACCINATION STATUS: Immunization History  Administered Date(s) Administered    Astrazeneca Covid-19 Vaccine, Pf, 0.5 Ml Non Us   01/29/2023   Influenza Inj Mdck Quad Pf 02/12/2016   Influenza,inj,Quad PF,6+ Mos 01/20/2018   Influenza,inj,quad, With Preservative 04/25/2022   Influenza-Unspecified 01/29/2023   Janssen (J&J) SARS-COV-2 Vaccination 03/17/2020   PNEUMOCOCCAL CONJUGATE-20 05/01/2023   Pneumococcal Polysaccharide-23 01/30/2015   Td,absorbed, Preservative Free, Adult Use, Lf Unspecified 07/31/1999   Tdap 07/31/1999, 04/08/2020    Diabetes She presents for her follow-up diabetic visit. She has type 2 diabetes mellitus. Onset time: She was diagnosed at approximate age of 47 years.  This patient was previously seen in this clinic for diabetic management, last visit was in 2021. Her disease course has been improving. There are no hypoglycemic associated symptoms. Pertinent negatives for hypoglycemia include no confusion, headaches, pallor or seizures. Associated symptoms include fatigue. Pertinent negatives for diabetes include no blurred vision, no chest pain, no polydipsia, no polyphagia and no polyuria. There are no hypoglycemic complications. Symptoms are improving. Diabetic complications include heart disease and nephropathy. Risk factors for coronary artery disease include dyslipidemia, diabetes mellitus, hypertension, obesity, family history, post-menopausal, sedentary lifestyle and tobacco exposure. Current diabetic treatment includes insulin  injections. Her weight is decreasing steadily. She is following a generally unhealthy diet. When asked about meal planning,  she reported none. She has had a previous visit with a dietitian. She rarely participates in exercise. Her home blood glucose trend is decreasing steadily. Her breakfast blood glucose range is generally 140-180 mg/dl. Her lunch blood glucose range is generally 140-180 mg/dl. Her dinner blood glucose range is generally 140-180 mg/dl. Her bedtime blood glucose range is generally 140-180 mg/dl. Her overall blood glucose range is 140-180 mg/dl. (She presents with improved glycemic profile.  Her CGM AGP shows 87% time range, 13% level 1 hyperglycemia, no hypoglycemia.  Her average blood glucose 148 for the most recent 14 days.  Her point-of-care A1c is 7.2%, progressively improving from 9.6.  ) An ACE inhibitor/angiotensin II receptor blocker is being taken. Eye exam is current.  Hypertension This is a chronic problem. Pertinent negatives include no blurred vision, chest pain, headaches, palpitations or shortness of breath. Risk factors for coronary artery disease include dyslipidemia, diabetes mellitus, family history, obesity, post-menopausal state, smoking/tobacco exposure and sedentary lifestyle. Past treatments include angiotensin blockers. Hypertensive end-organ damage includes CAD/MI. Identifiable causes of hypertension include chronic renal disease.    Objective:       10/09/2023    9:58 AM 08/28/2023    8:22 AM 07/03/2023   10:57 AM  Vitals with BMI  Height 5' 0 5' 0 5' 0  Weight 253  lbs 10 oz 258 lbs 259 lbs  BMI 49.53 50.39 50.58  Systolic 112 116   Diastolic 58 52   Pulse 60 76     BP (!) 112/58   Pulse 60   Ht 5' (1.524 m)   Wt 253 lb 9.6 oz (115 kg)   BMI 49.53 kg/m   Wt Readings from Last 3 Encounters:  10/09/23 253 lb 9.6 oz (115 kg)  08/28/23 258 lb (117 kg)  07/03/23 259 lb (117.5 kg)       CMP ( most recent) CMP     Component Value Date/Time   NA 138 04/22/2023 0000   K 5.0 04/22/2023 0000   CL 99 04/22/2023 0000   CO2 26 (A) 04/22/2023 0000   GLUCOSE 186 (H)  04/14/2023 0449   BUN 24 (A) 04/22/2023 0000   CREATININE 1.5 (A) 04/22/2023 0000   CREATININE 1.36 (H) 04/14/2023 0449   CREATININE 1.10 (H) 03/05/2019 1221   CALCIUM  9.4 04/22/2023 0000   PROT 8.2 (H) 04/13/2023 1846   PROT 6.9 01/11/2021 1014   ALBUMIN  3.8 04/22/2023 0000   ALBUMIN  4.1 01/11/2021 1014   AST 11 (A) 04/22/2023 0000   ALT 12 04/22/2023 0000   ALKPHOS 81 04/22/2023 0000   BILITOT 0.7 04/13/2023 1846   BILITOT 0.2 01/11/2021 1014   GFRNONAA 42 (L) 04/14/2023 0449   GFRNONAA 23 10/09/2022 0000   GFRNONAA 53 (L) 03/05/2019 1221   GFRAA 56 (L) 04/23/2019 1511   GFRAA 61 03/05/2019 1221     Diabetic Labs (most recent): Lab Results  Component Value Date   HGBA1C 7.7 04/22/2023   HGBA1C 7.8 (A) 02/04/2023   HGBA1C 9.6 10/09/2022   MICROALBUR 0.9 01/29/2018   MICROALBUR 1.5 07/10/2016     Lipid Panel ( most recent) Lipid Panel     Component Value Date/Time   CHOL 173 04/22/2023 0000   TRIG 147 04/22/2023 0000   HDL 41 04/22/2023 0000   CHOLHDL 3.8 03/05/2019 1221   VLDL 31 (H) 07/10/2016 0934   LDLCALC 106 04/22/2023 0000   LDLCALC 152 (H) 03/05/2019 1221      Lab Results  Component Value Date   TSH 1.79 04/23/2023   TSH 1.79 04/22/2023   TSH 1.304 04/14/2023   TSH 1.73 10/09/2022   TSH 1.49 03/05/2019   TSH 2.671 10/30/2018   TSH 3.05 04/24/2018   TSH 2.16 01/29/2018   TSH 3.71 07/11/2017   TSH 2.13 07/10/2016   FREET4 1.37 04/23/2023   FREET4 1.2 03/05/2019   FREET4 1.05 10/30/2018   FREET4 1.2 01/29/2018   FREET4 1.4 07/11/2017   FREET4 1.0 07/10/2016   FREET4 1.22 05/12/2015       Assessment & Plan:   1. DM type 2 causing vascular disease  - ALONZO LOVING has currently uncontrolled symptomatic type 2 DM since  73 years of age.  She presents with improved glycemic profile.  Her CGM AGP shows 87% time range, 13% level 1 hyperglycemia, no hypoglycemia.  Her average blood glucose 148 for the most recent 14 days.  Her point-of-care  A1c is 7.2%, progressively improving from 9.6.   - Recent labs reviewed. - I had a long discussion with her about the possible risk factors and  the pathology behind its diabetes and its complications. -her diabetes is complicated by coronary artery disease, CKD, obesity/sedentary life, polypharmacy, and she remains at a high risk for more acute and chronic complications which include CAD, CVA, CKD, retinopathy, and neuropathy. These  are all discussed in detail with her.  - I discussed all available options of managing her diabetes including de-escalation of medications. I have counseled her on Food as Medicine by adopting a Whole Food , Plant Predominant  ( WFPP) nutrition as recommended by [employer] of Lifestyle Medicine. Patient is encouraged to switch to  unprocessed or minimally processed  complex starch, adequate protein intake (mainly plant source), minimal liquid fat, plenty of fruits, and vegetables. -  she is advised to stick to a routine mealtimes to eat 3 complete meals a day and snack only when necessary ( to snack only to correct hypoglycemia BG <70 day time or <100 at night).   -Her engagement with lifestyle medicine nutrition is suboptimal, however she acknowledges that there is a room for improvement in her food and drink choices.  - Suggestion is made for her to avoid simple carbohydrates  from her diet including Cakes, Sweet Desserts, Ice Cream, Soda (diet and regular), Sweet Tea, Candies, Chips, Cookies, Store Bought Juices, Alcohol , Artificial Sweeteners,  Coffee Creamer, and Sugar-free Products, Lemonade. This will help patient to have more stable blood glucose profile and potentially avoid unintended weight gain.  The following Lifestyle Medicine recommendations according to American College of Lifestyle Medicine  [facility]) were discussed and and offered to patient and she  agrees to start the journey:  A. Whole Foods, Plant-Based Nutrition comprising of fruits and  vegetables, plant-based proteins, whole-grain carbohydrates was discussed in detail with the patient.   A list for source of those nutrients were also provided to the patient.  Patient will use only water  or unsweetened tea for hydration. B.  The need to stay away from risky substances including alcohol, smoking; obtaining 7 to 9 hours of restorative sleep, at least 150 minutes of moderate intensity exercise weekly, the importance of healthy social connections,  and stress management techniques were discussed. C.  A full color page of  Calorie density of various food groups per pound showing examples of each food groups was provided to the patient.  - I have approached her with the following individualized plan to manage  her diabetes and patient agrees:   - Based on her presentation with near target glycemic profile, she will not need prandial insulin  for now.  She is advised to continue Toujeo  50 units nightly.   - She is benefiting from her GLP-1 receptor agonist, discussed and increased Ozempic to 1 mg subcutaneously weekly.  This medication will be advanced as she tolerates.  Side effects and precautions discussed with her. - She has access for Jardiance , advised to continue Jardiance  10 mg p.o. daily at breakfast. - she is encouraged to call clinic for blood glucose levels less than 70 or above 200 mg /dl weekly average.  - Specific targets for  A1c;  LDL, HDL,  and Triglycerides were discussed with the patient.  2) Blood Pressure /Hypertension:   Her blood pressure is controlled to target.   she is advised to continue her current medications including losartan  50 mg p.o. daily with breakfast . 3) Lipids/Hyperlipidemia:   Review of her recent lipid panel showed uncontrolled lipid panel with LDL at 106.     she  is advised to continue    Repatha  140 mg subcutaneously every other week.   4)  Weight/Diet:  Body mass index is 49.53 kg/m.  -   clearly complicating her diabetes care.   she is  a  candidate for weight loss. I discussed with  her the fact that loss of 5 - 10% of her  current body weight will have the most impact on her diabetes management.  The above detailed  ACLM recommendations for nutrition, exercise, sleep, social life, avoidance of risky substances, the need for restorative sleep   information will also detailed on discharge instructions.  5) hypothyroidism: Longstanding diagnosis.  No recent thyroid  function test to review.  She is advised to continue levothyroxine  112 mcg p.o. daily before breakfast.     - We discussed about the correct intake of her thyroid  hormone, on empty stomach at fasting, with water , separated by at least 30 minutes from breakfast and other medications,  and separated by more than 4 hours from calcium , iron, multivitamins, acid reflux medications (PPIs). -Patient is made aware of the fact that thyroid  hormone replacement is needed for life, dose to be adjusted by periodic monitoring of thyroid  function tests.   6) vitamin D  deficiency: She is status post treatment with vitamin D  2 50,000 units for several weeks.  she is advised to switch to vitamin D3 2000 units daily for maintenance.   7) Chronic Care/Health Maintenance:  -she  is on ARB  medications and  is encouraged to initiate and continue to follow up with Ophthalmology, Dentist,  Podiatrist at least yearly or according to recommendations, and advised to   stay away from smoking. I have recommended yearly flu vaccine and pneumonia vaccine at least every 5 years; moderate intensity exercise for up to 150 minutes weekly; and  sleep for 7- 9 hours a day.  - she is  advised to maintain close follow up with Shona Norleen PEDLAR, MD for primary care needs, as well as her other providers for optimal and coordinated care.   I spent  43  minutes in the care of the patient today including review of labs from CMP, Lipids, Thyroid  Function, Hematology (current and previous including abstractions from other  facilities); face-to-face time discussing  her blood glucose readings/logs, discussing hypoglycemia and hyperglycemia episodes and symptoms, medications doses, her options of short and long term treatment based on the latest standards of care / guidelines;  discussion about incorporating lifestyle medicine;  and documenting the encounter. Risk reduction counseling performed per USPSTF guidelines to reduce  obesity and cardiovascular risk factors.     Please refer to Patient Instructions for Blood Glucose Monitoring and Insulin /Medications Dosing Guide  in media tab for additional information. Please  also refer to  Patient Self Inventory in the Media  tab for reviewed elements of pertinent patient history.  Norma [name] participated in the discussions, expressed understanding, and voiced agreement with the above plans.  All questions were answered to her satisfaction. she is encouraged to contact clinic should she have any questions or concerns prior to her return visit.     Follow up plan: - Return in about 4 months (around 02/08/2024) for F/U with Pre-visit Labs, Meter/CGM/Logs, A1c here.  Ranny Earl, MD [facility] [address] [place], [place] [address] Phone: [phone]  Fax: [phone]    10/09/2023, 10:21 AM  This note was partially dictated with voice recognition software. Similar sounding words can be transcribed inadequately or may not  be corrected upon review.

## 2023-10-10 DIAGNOSIS — N1832 Chronic kidney disease, stage 3b: Secondary | ICD-10-CM | POA: Diagnosis not present

## 2023-10-10 DIAGNOSIS — I130 Hypertensive heart and chronic kidney disease with heart failure and stage 1 through stage 4 chronic kidney disease, or unspecified chronic kidney disease: Secondary | ICD-10-CM | POA: Diagnosis not present

## 2023-10-10 DIAGNOSIS — I10 Essential (primary) hypertension: Secondary | ICD-10-CM | POA: Diagnosis not present

## 2023-10-10 DIAGNOSIS — I509 Heart failure, unspecified: Secondary | ICD-10-CM | POA: Diagnosis not present

## 2023-10-10 DIAGNOSIS — L97521 Non-pressure chronic ulcer of other part of left foot limited to breakdown of skin: Secondary | ICD-10-CM | POA: Diagnosis not present

## 2023-10-10 DIAGNOSIS — I2510 Atherosclerotic heart disease of native coronary artery without angina pectoris: Secondary | ICD-10-CM | POA: Diagnosis not present

## 2023-10-10 DIAGNOSIS — E11621 Type 2 diabetes mellitus with foot ulcer: Secondary | ICD-10-CM | POA: Diagnosis not present

## 2023-10-10 DIAGNOSIS — E1122 Type 2 diabetes mellitus with diabetic chronic kidney disease: Secondary | ICD-10-CM | POA: Diagnosis not present

## 2023-10-10 DIAGNOSIS — I5032 Chronic diastolic (congestive) heart failure: Secondary | ICD-10-CM | POA: Diagnosis not present

## 2023-10-11 DIAGNOSIS — Z794 Long term (current) use of insulin: Secondary | ICD-10-CM | POA: Diagnosis not present

## 2023-10-11 DIAGNOSIS — D1724 Benign lipomatous neoplasm of skin and subcutaneous tissue of left leg: Secondary | ICD-10-CM | POA: Diagnosis not present

## 2023-10-11 DIAGNOSIS — E039 Hypothyroidism, unspecified: Secondary | ICD-10-CM | POA: Diagnosis not present

## 2023-10-11 DIAGNOSIS — E11621 Type 2 diabetes mellitus with foot ulcer: Secondary | ICD-10-CM | POA: Diagnosis not present

## 2023-10-11 DIAGNOSIS — F419 Anxiety disorder, unspecified: Secondary | ICD-10-CM | POA: Diagnosis not present

## 2023-10-11 DIAGNOSIS — K760 Fatty (change of) liver, not elsewhere classified: Secondary | ICD-10-CM | POA: Diagnosis not present

## 2023-10-11 DIAGNOSIS — E1122 Type 2 diabetes mellitus with diabetic chronic kidney disease: Secondary | ICD-10-CM | POA: Diagnosis not present

## 2023-10-11 DIAGNOSIS — F319 Bipolar disorder, unspecified: Secondary | ICD-10-CM | POA: Diagnosis not present

## 2023-10-11 DIAGNOSIS — D631 Anemia in chronic kidney disease: Secondary | ICD-10-CM | POA: Diagnosis not present

## 2023-10-11 DIAGNOSIS — M109 Gout, unspecified: Secondary | ICD-10-CM | POA: Diagnosis not present

## 2023-10-11 DIAGNOSIS — I130 Hypertensive heart and chronic kidney disease with heart failure and stage 1 through stage 4 chronic kidney disease, or unspecified chronic kidney disease: Secondary | ICD-10-CM | POA: Diagnosis not present

## 2023-10-11 DIAGNOSIS — L97521 Non-pressure chronic ulcer of other part of left foot limited to breakdown of skin: Secondary | ICD-10-CM | POA: Diagnosis not present

## 2023-10-11 DIAGNOSIS — Z7984 Long term (current) use of oral hypoglycemic drugs: Secondary | ICD-10-CM | POA: Diagnosis not present

## 2023-10-11 DIAGNOSIS — Z604 Social exclusion and rejection: Secondary | ICD-10-CM | POA: Diagnosis not present

## 2023-10-11 DIAGNOSIS — Z87891 Personal history of nicotine dependence: Secondary | ICD-10-CM | POA: Diagnosis not present

## 2023-10-11 DIAGNOSIS — D509 Iron deficiency anemia, unspecified: Secondary | ICD-10-CM | POA: Diagnosis not present

## 2023-10-11 DIAGNOSIS — E66813 Obesity, class 3: Secondary | ICD-10-CM | POA: Diagnosis not present

## 2023-10-11 DIAGNOSIS — Z6841 Body Mass Index (BMI) 40.0 and over, adult: Secondary | ICD-10-CM | POA: Diagnosis not present

## 2023-10-11 DIAGNOSIS — N1832 Chronic kidney disease, stage 3b: Secondary | ICD-10-CM | POA: Diagnosis not present

## 2023-10-11 DIAGNOSIS — J449 Chronic obstructive pulmonary disease, unspecified: Secondary | ICD-10-CM | POA: Diagnosis not present

## 2023-10-11 DIAGNOSIS — I2510 Atherosclerotic heart disease of native coronary artery without angina pectoris: Secondary | ICD-10-CM | POA: Diagnosis not present

## 2023-10-11 DIAGNOSIS — Z9981 Dependence on supplemental oxygen: Secondary | ICD-10-CM | POA: Diagnosis not present

## 2023-10-11 DIAGNOSIS — I509 Heart failure, unspecified: Secondary | ICD-10-CM | POA: Diagnosis not present

## 2023-10-11 DIAGNOSIS — E1142 Type 2 diabetes mellitus with diabetic polyneuropathy: Secondary | ICD-10-CM | POA: Diagnosis not present

## 2023-10-11 DIAGNOSIS — Z7982 Long term (current) use of aspirin: Secondary | ICD-10-CM | POA: Diagnosis not present

## 2023-10-13 ENCOUNTER — Telehealth: Payer: Self-pay | Admitting: Pharmacy Technician

## 2023-10-13 DIAGNOSIS — E118 Type 2 diabetes mellitus with unspecified complications: Secondary | ICD-10-CM | POA: Diagnosis not present

## 2023-10-13 DIAGNOSIS — I509 Heart failure, unspecified: Secondary | ICD-10-CM | POA: Diagnosis not present

## 2023-10-13 DIAGNOSIS — G4733 Obstructive sleep apnea (adult) (pediatric): Secondary | ICD-10-CM | POA: Diagnosis not present

## 2023-10-13 DIAGNOSIS — R410 Disorientation, unspecified: Secondary | ICD-10-CM | POA: Diagnosis not present

## 2023-10-13 DIAGNOSIS — J449 Chronic obstructive pulmonary disease, unspecified: Secondary | ICD-10-CM | POA: Diagnosis not present

## 2023-10-13 DIAGNOSIS — R42 Dizziness and giddiness: Secondary | ICD-10-CM | POA: Diagnosis not present

## 2023-10-13 NOTE — Progress Notes (Signed)
   10/13/2023  Patient ID: Norma [name], female   DOB: 13-Feb-1951, 73 y.o.   MRN: [id number]  Voicemail left by patient informing that Ozempic  dose with Novo Nordisk has been increased and she would like to start receiving the new dose to avoid giving herself 2 injections of Ozempic  0.5mg .  Sent staff message to Dr. Barbette nurse Zada Sharps requesting the Novo Nordisk change request form be completed and faxed into [employer].  Successful call placed to patient. HIPAA verified. Informed patient of the above information and advised patient to call Dr. Lenis office or [employer] for an update later in the week or the start of next week.  Patient verbalized understanding.  Norma [name], CPhT [facility] Office: [phone] Email: [name]

## 2023-10-15 ENCOUNTER — Telehealth: Payer: Self-pay | Admitting: Cardiology

## 2023-10-15 DIAGNOSIS — E1122 Type 2 diabetes mellitus with diabetic chronic kidney disease: Secondary | ICD-10-CM | POA: Diagnosis not present

## 2023-10-15 DIAGNOSIS — N1832 Chronic kidney disease, stage 3b: Secondary | ICD-10-CM | POA: Diagnosis not present

## 2023-10-15 DIAGNOSIS — I130 Hypertensive heart and chronic kidney disease with heart failure and stage 1 through stage 4 chronic kidney disease, or unspecified chronic kidney disease: Secondary | ICD-10-CM | POA: Diagnosis not present

## 2023-10-15 DIAGNOSIS — L97521 Non-pressure chronic ulcer of other part of left foot limited to breakdown of skin: Secondary | ICD-10-CM | POA: Diagnosis not present

## 2023-10-15 DIAGNOSIS — I509 Heart failure, unspecified: Secondary | ICD-10-CM | POA: Diagnosis not present

## 2023-10-15 DIAGNOSIS — E11621 Type 2 diabetes mellitus with foot ulcer: Secondary | ICD-10-CM | POA: Diagnosis not present

## 2023-10-15 NOTE — Telephone Encounter (Signed)
Routed EKG to PCP

## 2023-10-15 NOTE — Telephone Encounter (Signed)
 Dr Shona would like us  to fax EKG done on 6/12 to his office at [phone]

## 2023-10-17 DIAGNOSIS — I509 Heart failure, unspecified: Secondary | ICD-10-CM | POA: Diagnosis not present

## 2023-10-17 DIAGNOSIS — N1832 Chronic kidney disease, stage 3b: Secondary | ICD-10-CM | POA: Diagnosis not present

## 2023-10-17 DIAGNOSIS — E11621 Type 2 diabetes mellitus with foot ulcer: Secondary | ICD-10-CM | POA: Diagnosis not present

## 2023-10-17 DIAGNOSIS — E1122 Type 2 diabetes mellitus with diabetic chronic kidney disease: Secondary | ICD-10-CM | POA: Diagnosis not present

## 2023-10-17 DIAGNOSIS — I130 Hypertensive heart and chronic kidney disease with heart failure and stage 1 through stage 4 chronic kidney disease, or unspecified chronic kidney disease: Secondary | ICD-10-CM | POA: Diagnosis not present

## 2023-10-17 DIAGNOSIS — L97521 Non-pressure chronic ulcer of other part of left foot limited to breakdown of skin: Secondary | ICD-10-CM | POA: Diagnosis not present

## 2023-10-20 DIAGNOSIS — M48061 Spinal stenosis, lumbar region without neurogenic claudication: Secondary | ICD-10-CM | POA: Diagnosis not present

## 2023-10-20 DIAGNOSIS — G8929 Other chronic pain: Secondary | ICD-10-CM | POA: Diagnosis not present

## 2023-10-22 ENCOUNTER — Other Ambulatory Visit: Payer: Self-pay | Admitting: "Endocrinology

## 2023-10-22 DIAGNOSIS — E11621 Type 2 diabetes mellitus with foot ulcer: Secondary | ICD-10-CM | POA: Diagnosis not present

## 2023-10-22 DIAGNOSIS — L97521 Non-pressure chronic ulcer of other part of left foot limited to breakdown of skin: Secondary | ICD-10-CM | POA: Diagnosis not present

## 2023-10-24 DIAGNOSIS — I10 Essential (primary) hypertension: Secondary | ICD-10-CM | POA: Diagnosis not present

## 2023-10-24 DIAGNOSIS — I130 Hypertensive heart and chronic kidney disease with heart failure and stage 1 through stage 4 chronic kidney disease, or unspecified chronic kidney disease: Secondary | ICD-10-CM | POA: Diagnosis not present

## 2023-10-24 DIAGNOSIS — E039 Hypothyroidism, unspecified: Secondary | ICD-10-CM | POA: Diagnosis not present

## 2023-10-24 DIAGNOSIS — J309 Allergic rhinitis, unspecified: Secondary | ICD-10-CM | POA: Diagnosis not present

## 2023-10-24 DIAGNOSIS — E1122 Type 2 diabetes mellitus with diabetic chronic kidney disease: Secondary | ICD-10-CM | POA: Diagnosis not present

## 2023-10-24 DIAGNOSIS — K219 Gastro-esophageal reflux disease without esophagitis: Secondary | ICD-10-CM | POA: Diagnosis not present

## 2023-10-24 DIAGNOSIS — I509 Heart failure, unspecified: Secondary | ICD-10-CM | POA: Diagnosis not present

## 2023-10-24 DIAGNOSIS — L97521 Non-pressure chronic ulcer of other part of left foot limited to breakdown of skin: Secondary | ICD-10-CM | POA: Diagnosis not present

## 2023-10-24 DIAGNOSIS — I2510 Atherosclerotic heart disease of native coronary artery without angina pectoris: Secondary | ICD-10-CM | POA: Diagnosis not present

## 2023-10-24 DIAGNOSIS — S31109D Unspecified open wound of abdominal wall, unspecified quadrant without penetration into peritoneal cavity, subsequent encounter: Secondary | ICD-10-CM | POA: Diagnosis not present

## 2023-10-24 DIAGNOSIS — N1832 Chronic kidney disease, stage 3b: Secondary | ICD-10-CM | POA: Diagnosis not present

## 2023-10-24 DIAGNOSIS — E1165 Type 2 diabetes mellitus with hyperglycemia: Secondary | ICD-10-CM | POA: Diagnosis not present

## 2023-10-24 DIAGNOSIS — E11621 Type 2 diabetes mellitus with foot ulcer: Secondary | ICD-10-CM | POA: Diagnosis not present

## 2023-10-24 DIAGNOSIS — I5032 Chronic diastolic (congestive) heart failure: Secondary | ICD-10-CM | POA: Diagnosis not present

## 2023-10-24 DIAGNOSIS — F3181 Bipolar II disorder: Secondary | ICD-10-CM | POA: Diagnosis not present

## 2023-10-24 DIAGNOSIS — R251 Tremor, unspecified: Secondary | ICD-10-CM | POA: Diagnosis not present

## 2023-10-24 DIAGNOSIS — E782 Mixed hyperlipidemia: Secondary | ICD-10-CM | POA: Diagnosis not present

## 2023-10-27 DIAGNOSIS — E11621 Type 2 diabetes mellitus with foot ulcer: Secondary | ICD-10-CM | POA: Diagnosis not present

## 2023-10-27 DIAGNOSIS — E1122 Type 2 diabetes mellitus with diabetic chronic kidney disease: Secondary | ICD-10-CM | POA: Diagnosis not present

## 2023-10-27 DIAGNOSIS — I130 Hypertensive heart and chronic kidney disease with heart failure and stage 1 through stage 4 chronic kidney disease, or unspecified chronic kidney disease: Secondary | ICD-10-CM | POA: Diagnosis not present

## 2023-10-27 DIAGNOSIS — L97521 Non-pressure chronic ulcer of other part of left foot limited to breakdown of skin: Secondary | ICD-10-CM | POA: Diagnosis not present

## 2023-10-27 DIAGNOSIS — N1832 Chronic kidney disease, stage 3b: Secondary | ICD-10-CM | POA: Diagnosis not present

## 2023-10-27 DIAGNOSIS — I509 Heart failure, unspecified: Secondary | ICD-10-CM | POA: Diagnosis not present

## 2023-10-29 DIAGNOSIS — M545 Low back pain, unspecified: Secondary | ICD-10-CM | POA: Diagnosis not present

## 2023-10-29 DIAGNOSIS — Z0001 Encounter for general adult medical examination with abnormal findings: Secondary | ICD-10-CM | POA: Diagnosis not present

## 2023-10-29 DIAGNOSIS — L97509 Non-pressure chronic ulcer of other part of unspecified foot with unspecified severity: Secondary | ICD-10-CM | POA: Diagnosis not present

## 2023-10-29 DIAGNOSIS — L97521 Non-pressure chronic ulcer of other part of left foot limited to breakdown of skin: Secondary | ICD-10-CM | POA: Diagnosis not present

## 2023-10-29 DIAGNOSIS — G2581 Restless legs syndrome: Secondary | ICD-10-CM | POA: Diagnosis not present

## 2023-10-29 DIAGNOSIS — H538 Other visual disturbances: Secondary | ICD-10-CM | POA: Diagnosis not present

## 2023-10-29 DIAGNOSIS — E039 Hypothyroidism, unspecified: Secondary | ICD-10-CM | POA: Diagnosis not present

## 2023-10-29 DIAGNOSIS — Z7689 Persons encountering health services in other specified circumstances: Secondary | ICD-10-CM | POA: Diagnosis not present

## 2023-10-29 DIAGNOSIS — J449 Chronic obstructive pulmonary disease, unspecified: Secondary | ICD-10-CM | POA: Diagnosis not present

## 2023-10-29 DIAGNOSIS — I1310 Hypertensive heart and chronic kidney disease without heart failure, with stage 1 through stage 4 chronic kidney disease, or unspecified chronic kidney disease: Secondary | ICD-10-CM | POA: Diagnosis not present

## 2023-10-29 DIAGNOSIS — E1329 Other specified diabetes mellitus with other diabetic kidney complication: Secondary | ICD-10-CM | POA: Diagnosis not present

## 2023-10-29 DIAGNOSIS — I509 Heart failure, unspecified: Secondary | ICD-10-CM | POA: Diagnosis not present

## 2023-10-29 DIAGNOSIS — E11621 Type 2 diabetes mellitus with foot ulcer: Secondary | ICD-10-CM | POA: Diagnosis not present

## 2023-10-29 DIAGNOSIS — G4733 Obstructive sleep apnea (adult) (pediatric): Secondary | ICD-10-CM | POA: Diagnosis not present

## 2023-10-31 DIAGNOSIS — L97521 Non-pressure chronic ulcer of other part of left foot limited to breakdown of skin: Secondary | ICD-10-CM | POA: Diagnosis not present

## 2023-10-31 DIAGNOSIS — N1832 Chronic kidney disease, stage 3b: Secondary | ICD-10-CM | POA: Diagnosis not present

## 2023-10-31 DIAGNOSIS — E1122 Type 2 diabetes mellitus with diabetic chronic kidney disease: Secondary | ICD-10-CM | POA: Diagnosis not present

## 2023-10-31 DIAGNOSIS — I130 Hypertensive heart and chronic kidney disease with heart failure and stage 1 through stage 4 chronic kidney disease, or unspecified chronic kidney disease: Secondary | ICD-10-CM | POA: Diagnosis not present

## 2023-10-31 DIAGNOSIS — I509 Heart failure, unspecified: Secondary | ICD-10-CM | POA: Diagnosis not present

## 2023-10-31 DIAGNOSIS — E11621 Type 2 diabetes mellitus with foot ulcer: Secondary | ICD-10-CM | POA: Diagnosis not present

## 2023-11-03 ENCOUNTER — Telehealth: Payer: Self-pay | Admitting: Pharmacy Technician

## 2023-11-03 DIAGNOSIS — Z5986 Financial insecurity: Secondary | ICD-10-CM

## 2023-11-03 DIAGNOSIS — E1122 Type 2 diabetes mellitus with diabetic chronic kidney disease: Secondary | ICD-10-CM | POA: Diagnosis not present

## 2023-11-03 DIAGNOSIS — L97521 Non-pressure chronic ulcer of other part of left foot limited to breakdown of skin: Secondary | ICD-10-CM | POA: Diagnosis not present

## 2023-11-03 DIAGNOSIS — E11621 Type 2 diabetes mellitus with foot ulcer: Secondary | ICD-10-CM | POA: Diagnosis not present

## 2023-11-03 DIAGNOSIS — N1832 Chronic kidney disease, stage 3b: Secondary | ICD-10-CM | POA: Diagnosis not present

## 2023-11-03 DIAGNOSIS — I130 Hypertensive heart and chronic kidney disease with heart failure and stage 1 through stage 4 chronic kidney disease, or unspecified chronic kidney disease: Secondary | ICD-10-CM | POA: Diagnosis not present

## 2023-11-03 DIAGNOSIS — I509 Heart failure, unspecified: Secondary | ICD-10-CM | POA: Diagnosis not present

## 2023-11-03 NOTE — Progress Notes (Signed)
   11/03/2023  Patient ID: Norma [name], female   DOB: 1950-06-08, 73 y.o.   MRN: [id number]  Incoming call received from patient in regard to a new medication that Dr Milford office placed her on that she can not afford.  HIPAA verified.  Patient informs she was placed on Linzess but the cost to her with insurance is over $100 and she is calling to see if this medication has patient assistance. It appears that the medication is offered thru [employer] patient assistance program.  Will cc chart to PharmD Ozell Lefevre for assistance.  Hung Rhinesmith, CPhT [facility] Office: [phone] Email: [email or web address]

## 2023-11-05 DIAGNOSIS — L97421 Non-pressure chronic ulcer of left heel and midfoot limited to breakdown of skin: Secondary | ICD-10-CM | POA: Diagnosis not present

## 2023-11-05 DIAGNOSIS — L97521 Non-pressure chronic ulcer of other part of left foot limited to breakdown of skin: Secondary | ICD-10-CM | POA: Diagnosis not present

## 2023-11-05 DIAGNOSIS — E11621 Type 2 diabetes mellitus with foot ulcer: Secondary | ICD-10-CM | POA: Insufficient documentation

## 2023-11-07 DIAGNOSIS — E1122 Type 2 diabetes mellitus with diabetic chronic kidney disease: Secondary | ICD-10-CM | POA: Diagnosis not present

## 2023-11-07 DIAGNOSIS — I130 Hypertensive heart and chronic kidney disease with heart failure and stage 1 through stage 4 chronic kidney disease, or unspecified chronic kidney disease: Secondary | ICD-10-CM | POA: Diagnosis not present

## 2023-11-07 DIAGNOSIS — I509 Heart failure, unspecified: Secondary | ICD-10-CM | POA: Diagnosis not present

## 2023-11-07 DIAGNOSIS — E11621 Type 2 diabetes mellitus with foot ulcer: Secondary | ICD-10-CM | POA: Diagnosis not present

## 2023-11-07 DIAGNOSIS — N1832 Chronic kidney disease, stage 3b: Secondary | ICD-10-CM | POA: Diagnosis not present

## 2023-11-07 DIAGNOSIS — L97521 Non-pressure chronic ulcer of other part of left foot limited to breakdown of skin: Secondary | ICD-10-CM | POA: Diagnosis not present

## 2023-11-10 DIAGNOSIS — E66813 Obesity, class 3: Secondary | ICD-10-CM | POA: Diagnosis not present

## 2023-11-10 DIAGNOSIS — F419 Anxiety disorder, unspecified: Secondary | ICD-10-CM | POA: Diagnosis not present

## 2023-11-10 DIAGNOSIS — E1142 Type 2 diabetes mellitus with diabetic polyneuropathy: Secondary | ICD-10-CM | POA: Diagnosis not present

## 2023-11-10 DIAGNOSIS — Z604 Social exclusion and rejection: Secondary | ICD-10-CM | POA: Diagnosis not present

## 2023-11-10 DIAGNOSIS — I509 Heart failure, unspecified: Secondary | ICD-10-CM | POA: Diagnosis not present

## 2023-11-10 DIAGNOSIS — D631 Anemia in chronic kidney disease: Secondary | ICD-10-CM | POA: Diagnosis not present

## 2023-11-10 DIAGNOSIS — Z6841 Body Mass Index (BMI) 40.0 and over, adult: Secondary | ICD-10-CM | POA: Diagnosis not present

## 2023-11-10 DIAGNOSIS — Z794 Long term (current) use of insulin: Secondary | ICD-10-CM | POA: Diagnosis not present

## 2023-11-10 DIAGNOSIS — J449 Chronic obstructive pulmonary disease, unspecified: Secondary | ICD-10-CM | POA: Diagnosis not present

## 2023-11-10 DIAGNOSIS — E11621 Type 2 diabetes mellitus with foot ulcer: Secondary | ICD-10-CM | POA: Diagnosis not present

## 2023-11-10 DIAGNOSIS — I130 Hypertensive heart and chronic kidney disease with heart failure and stage 1 through stage 4 chronic kidney disease, or unspecified chronic kidney disease: Secondary | ICD-10-CM | POA: Diagnosis not present

## 2023-11-10 DIAGNOSIS — D1724 Benign lipomatous neoplasm of skin and subcutaneous tissue of left leg: Secondary | ICD-10-CM | POA: Diagnosis not present

## 2023-11-10 DIAGNOSIS — M109 Gout, unspecified: Secondary | ICD-10-CM | POA: Diagnosis not present

## 2023-11-10 DIAGNOSIS — F319 Bipolar disorder, unspecified: Secondary | ICD-10-CM | POA: Diagnosis not present

## 2023-11-10 DIAGNOSIS — L97521 Non-pressure chronic ulcer of other part of left foot limited to breakdown of skin: Secondary | ICD-10-CM | POA: Diagnosis not present

## 2023-11-10 DIAGNOSIS — I2510 Atherosclerotic heart disease of native coronary artery without angina pectoris: Secondary | ICD-10-CM | POA: Diagnosis not present

## 2023-11-10 DIAGNOSIS — D509 Iron deficiency anemia, unspecified: Secondary | ICD-10-CM | POA: Diagnosis not present

## 2023-11-10 DIAGNOSIS — Z87891 Personal history of nicotine dependence: Secondary | ICD-10-CM | POA: Diagnosis not present

## 2023-11-10 DIAGNOSIS — Z7984 Long term (current) use of oral hypoglycemic drugs: Secondary | ICD-10-CM | POA: Diagnosis not present

## 2023-11-10 DIAGNOSIS — E039 Hypothyroidism, unspecified: Secondary | ICD-10-CM | POA: Diagnosis not present

## 2023-11-10 DIAGNOSIS — K760 Fatty (change of) liver, not elsewhere classified: Secondary | ICD-10-CM | POA: Diagnosis not present

## 2023-11-10 DIAGNOSIS — Z7982 Long term (current) use of aspirin: Secondary | ICD-10-CM | POA: Diagnosis not present

## 2023-11-10 DIAGNOSIS — N1832 Chronic kidney disease, stage 3b: Secondary | ICD-10-CM | POA: Diagnosis not present

## 2023-11-10 DIAGNOSIS — E1122 Type 2 diabetes mellitus with diabetic chronic kidney disease: Secondary | ICD-10-CM | POA: Diagnosis not present

## 2023-11-10 DIAGNOSIS — Z9981 Dependence on supplemental oxygen: Secondary | ICD-10-CM | POA: Diagnosis not present

## 2023-11-12 ENCOUNTER — Encounter: Payer: Self-pay | Admitting: Cardiology

## 2023-11-12 ENCOUNTER — Telehealth: Payer: Self-pay | Admitting: "Endocrinology

## 2023-11-12 ENCOUNTER — Telehealth (HOSPITAL_COMMUNITY): Admitting: Psychiatry

## 2023-11-12 ENCOUNTER — Ambulatory Visit: Attending: Cardiology | Admitting: Cardiology

## 2023-11-12 VITALS — BP 112/64 | HR 70 | Ht 60.0 in | Wt 253.0 lb

## 2023-11-12 DIAGNOSIS — I5032 Chronic diastolic (congestive) heart failure: Secondary | ICD-10-CM | POA: Diagnosis not present

## 2023-11-12 DIAGNOSIS — J449 Chronic obstructive pulmonary disease, unspecified: Secondary | ICD-10-CM | POA: Diagnosis not present

## 2023-11-12 DIAGNOSIS — E11621 Type 2 diabetes mellitus with foot ulcer: Secondary | ICD-10-CM | POA: Diagnosis not present

## 2023-11-12 DIAGNOSIS — M109 Gout, unspecified: Secondary | ICD-10-CM | POA: Diagnosis not present

## 2023-11-12 DIAGNOSIS — I110 Hypertensive heart disease with heart failure: Secondary | ICD-10-CM | POA: Diagnosis not present

## 2023-11-12 DIAGNOSIS — E039 Hypothyroidism, unspecified: Secondary | ICD-10-CM | POA: Diagnosis not present

## 2023-11-12 DIAGNOSIS — E785 Hyperlipidemia, unspecified: Secondary | ICD-10-CM | POA: Diagnosis not present

## 2023-11-12 DIAGNOSIS — L97521 Non-pressure chronic ulcer of other part of left foot limited to breakdown of skin: Secondary | ICD-10-CM | POA: Diagnosis not present

## 2023-11-12 DIAGNOSIS — Z7985 Long-term (current) use of injectable non-insulin antidiabetic drugs: Secondary | ICD-10-CM | POA: Diagnosis not present

## 2023-11-12 DIAGNOSIS — I2510 Atherosclerotic heart disease of native coronary artery without angina pectoris: Secondary | ICD-10-CM | POA: Insufficient documentation

## 2023-11-12 DIAGNOSIS — Z87891 Personal history of nicotine dependence: Secondary | ICD-10-CM | POA: Diagnosis not present

## 2023-11-12 DIAGNOSIS — L97421 Non-pressure chronic ulcer of left heel and midfoot limited to breakdown of skin: Secondary | ICD-10-CM | POA: Diagnosis not present

## 2023-11-12 DIAGNOSIS — Z79899 Other long term (current) drug therapy: Secondary | ICD-10-CM | POA: Diagnosis not present

## 2023-11-12 DIAGNOSIS — I509 Heart failure, unspecified: Secondary | ICD-10-CM | POA: Diagnosis not present

## 2023-11-12 DIAGNOSIS — E1140 Type 2 diabetes mellitus with diabetic neuropathy, unspecified: Secondary | ICD-10-CM | POA: Diagnosis not present

## 2023-11-12 DIAGNOSIS — Z7982 Long term (current) use of aspirin: Secondary | ICD-10-CM | POA: Diagnosis not present

## 2023-11-12 MED ORDER — PROPRANOLOL HCL 40 MG PO TABS: 40.0000 mg | ORAL_TABLET | Freq: Two times a day (BID) | ORAL | 6 refills | Status: AC

## 2023-11-12 NOTE — Telephone Encounter (Signed)
 LVM to let pt know pt assistance of ozempic  is here

## 2023-11-12 NOTE — Patient Instructions (Signed)
 Medication Instructions:   Increase Propranolol  to 40mg  twice a day   Continue all other medications.     Labwork:  none  Testing/Procedures:  none  Follow-Up:  6 months   Any Other Special Instructions Will Be Listed Below (If Applicable).   If you need a refill on your cardiac medications before your next appointment, please call your pharmacy.

## 2023-11-12 NOTE — Progress Notes (Signed)
 Clinical Summary Norma [name] is a 73 y.o.female seen today for follow up of the following medical problems.    1. Chronic diastolic HF 01/2018 echo VLEF 49-44%, grade I diastolic dysfunction.  -10/2020 echo LVEF 50-55%    - mild infrequent edema, no SOB/DOE - compliant with meds. Takes the torsemide  prn, has not needed in quite some time. In general trying to limit due to her kidney dysfunction.     05/2022 echo: LVEF 55-60%, no WMAs, normal diastolic fxn, normal RV, normal PASP   - chronic SOB/DOE - no recent edema -compliant with meds. Per her report nephrology had asked her to stop diuretic given worsening renal function.       2. CAD - 08/2017 cath for unstable angina: LAD 95%, D1 90%, RPDA 80%, OM 90%.  - CT surgery was consulted.  -08/30/17 CABG with sequential SVG to diag and LAD, SVG-OM.   - chronic chest pains, typically at rest. Can vary in location. Lasts just a few seconds.       3. Chronic LBBB     4. Hyperlipidemia -intolerant to statins - followed in lipid clinic.   - she is on repatha , LDL 152-->43 on repatha .    -mixed compliance with repatha       5. OSA - on cpap, followed by Dr Jude   6. CKD 3b - followed Dr Rachele     7.Back surgery 07/12/21   8. Carotid stenosis - 07/2022 carotid US  1-39% bilateral disease Past Medical History:  Diagnosis Date   Anemia    Anxiety    Bipolar disorder (HCC)    Bulging lumbar disc    L3-4   Chronic daily headache    Chronic low back pain 09/20/2014   CKD (chronic kidney disease)    stage 3   COPD (chronic obstructive pulmonary disease) (HCC)    Coronary artery disease    CABG 2019   Degenerative arthritis    Depression    Diabetes mellitus, type II (HCC)    Diabetic neuropathy (HCC) 09/16/2018   DM type 2 with diabetic peripheral neuropathy (HCC) 09/20/2014   Dyslipidemia    Dyspnea    Family history of adverse reaction to anesthesia    father had reaction to anesthesia medication per  patient they don't use it anymore- unsure of reaction   Fatty liver    per pt report   Gastroparesis    GERD (gastroesophageal reflux disease)    Heart murmur    History of hiatal hernia    pt states she no longer has this   Hypertension    Hypothyroidism    IBS (irritable bowel syndrome)    LBBB (left bundle Artha Stavros block)    Memory difficulty 09/20/2014   Morbid obesity (HCC)    Myocardial infarction (HCC)    per pt- prior to CABG   Neuropathy    Obstructive sleep apnea on CPAP    Renal insufficiency    Restless legs syndrome (RLS) 09/17/2012   Stroke (cerebrum) (HCC) 05/16/2017   Left parietal   Urticaria      Allergies  Allergen Reactions   Amoxil [Amoxicillin] Anaphylaxis   Hydrocodone Anaphylaxis   Percocet [Oxycodone -Acetaminophen ] Other (See Comments)    Causes chest pain /tightness. Pressure pain around her ribs.   Depacon [Valproic Acid] Other (See Comments)    Causes falls    Prednisone Itching   Roxicodone  [Oxycodone ] Other (See Comments)    Chest pain, tightness  Current Outpatient Medications  Medication Sig Dispense Refill   acetaminophen  (TYLENOL ) 500 MG tablet Take 1,000 mg by mouth daily as needed for moderate pain (pain score 4-6), fever or headache.     ALPRAZolam  (XANAX ) 0.25 MG tablet TAKE 1 TABLET BY MOUTH AT BEDTIME AS NEEDED FOR ANXIETY OR FOR SLEEP     ARIPiprazole  (ABILIFY ) 5 MG tablet Take 1 tablet (5 mg total) by mouth daily. 30 tablet 2   aspirin  EC 81 MG tablet Take 1 tablet (81 mg total) by mouth daily. 90 tablet 3   azelastine (ASTELIN) 0.1 % nasal spray Place 1 spray into both nostrils 2 (two) times daily as needed for allergies.     buPROPion  (WELLBUTRIN  XL) 150 MG 24 hr tablet Take 1 tablet (150 mg total) by mouth every morning. 30 tablet 2   cetirizine  (ZYRTEC ) 10 MG chewable tablet Chew 10 mg by mouth daily.     Cholecalciferol  (VITAMIN D3) 50 MCG (2000 UT) capsule Take 1 capsule (2,000 Units total) by mouth daily with lunch.  90 capsule 3   diclofenac Sodium (VOLTAREN) 1 % GEL Apply 2 g topically 4 (four) times daily as needed (pain).     EASY TOUCH PEN NEEDLES 31G X 6 MM MISC USE AS DIRECTED with Toujeo  (she wants 6mm) 100 each 2   EPINEPHrine  0.3 mg/0.3 mL IJ SOAJ injection Inject 0.3 mg into the muscle as needed.     Evolocumab  (REPATHA  SURECLICK) 140 MG/ML SOAJ Inject 140 mg into the skin every 14 (fourteen) days. 6 mL 1   gabapentin  (NEURONTIN ) 400 MG capsule Take 400-800 mg by mouth See admin instructions. Take 1 capsule (400mg ) every morning and 2 capsules (800mg ) at bedtime.     insulin  glargine, 1 Unit Dial, (TOUJEO  SOLOSTAR) 300 UNIT/ML Solostar Pen Inject 50 Units into the skin at bedtime. 13.5 mL 2   JARDIANCE  10 MG TABS tablet TAKE 1 TABLET (10 MG TOTAL) BY MOUTH DAILY WITH BREAKFAST. 90 tablet 2   lamoTRIgine  (LAMICTAL ) 150 MG tablet Take 1 tablet (150 mg total) by mouth at bedtime. 30 tablet 2   levothyroxine  (SYNTHROID ) 112 MCG tablet Take 112 mcg by mouth daily.     losartan  (COZAAR ) 25 MG tablet Take 25 mg by mouth daily.     pantoprazole  (PROTONIX ) 40 MG tablet TAKE 1 TABLET BY MOUTH AT BEDTIME 90 tablet 2   propranolol  (INDERAL ) 10 MG tablet Take 10 mg by mouth 2 (two) times daily.     rOPINIRole  (REQUIP ) 3 MG tablet TAKE ONE TABLET BY MOUTH EVERY MORNING, ,NOON AND AT BEDTIME. (PRT PT MORNING,EVENING,BEDTIME) (Patient taking differently: Take 3-6 mg by mouth See admin instructions. Take 1 tablet (3mg ) every morning and 2 tablets (6mg ) at bedtime.) 270 tablet 3   Semaglutide , 1 MG/DOSE, 4 MG/3ML SOPN Inject 1 mg as directed once a week. 9 mL 0   tiZANidine  (ZANAFLEX ) 2 MG tablet Take 2 mg by mouth 2 (two) times daily.     triamcinolone  cream (KENALOG) 0.1 % SMARTSIG:sparingly Topical Twice Daily     venlafaxine  XR (EFFEXOR -XR) 150 MG 24 hr capsule TAKE TWO (2) CAPSULES BY MOUTH AT BEDTIME 60 capsule 2   No current facility-administered medications for this visit.     Past Surgical History:   Procedure Laterality Date   ABDOMINAL HYSTERECTOMY     ARTHROSCOPY KNEE W/ DRILLING  06/2011   and Decemer of 2013.   Carpel tunnel  1980's   CATARACT EXTRACTION W/PHACO Right 03/21/2017   Procedure:  CATARACT EXTRACTION PHACO AND INTRAOCULAR LENS PLACEMENT RIGHT EYE;  Surgeon: Harrie Agent, MD;  Location: [facility];  Service: Ophthalmology;  Laterality: Right;  CDE: 2.91    CATARACT EXTRACTION W/PHACO Left 04/18/2017   Procedure: CATARACT EXTRACTION PHACO AND INTRAOCULAR LENS PLACEMENT LEFT EYE;  Surgeon: Harrie Agent, MD;  Location: [facility];  Service: Ophthalmology;  Laterality: Left;  left   CHOLECYSTECTOMY     COLONOSCOPY WITH PROPOFOL  N/A 10/04/2016   Procedure: COLONOSCOPY WITH PROPOFOL ;  Surgeon: Golda Claudis PENNER, MD;  Location: [facility];  Service: Endoscopy;  Laterality: N/A;  11:10   CORONARY ARTERY BYPASS GRAFT N/A 08/29/2017   Procedure: CORONARY ARTERY BYPASS GRAFTING (CABG) x 3 WITH ENDOSCOPIC HARVESTING OF RIGHT SAPHENOUS VEIN;  Surgeon: Fleeta Hanford Coy, MD;  Location: [facility] OR;  Service: Open Heart Surgery;  Laterality: N/A;   ESOPHAGOGASTRODUODENOSCOPY N/A 04/25/2016   Procedure: ESOPHAGOGASTRODUODENOSCOPY (EGD);  Surgeon: Claudis PENNER Golda, MD;  Location: [facility];  Service: Endoscopy;  Laterality: N/A;  730   ESOPHAGOGASTRODUODENOSCOPY  03/2023   LEFT HEART CATH AND CORONARY ANGIOGRAPHY N/A 08/27/2017   Procedure: LEFT HEART CATH AND CORONARY ANGIOGRAPHY;  Surgeon: Wonda Sharper, MD;  Location[facility];  Service: Cardiovascular::  pLAD 95% - p-mLAD 50%, ostD1 90% -pD1 80%; ostOM1 90%; rPDA 80%. EF ~50-55% - HK of dital Anterolateral & Apical wall.  - Rec CVTS c/s   NECK SURGERY     pt reports having growth removed from the back of her neck- after 2021   OPEN REDUCTION INTERNAL FIXATION (ORIF) HAND Left 04/10/2020   Procedure: OPEN REDUCTION INTERNAL FIXATION (ORIF) HAND, left index finger;  Surgeon: Margrette Taft BRAVO, MD;  Location: [facility];  Service:  Orthopedics;  Laterality: Left;  0.045 k wires   RECTAL SURGERY     fissure   SHOULDER SURGERY Left    arthroscopy in March of this year   TEE WITHOUT CARDIOVERSION N/A 08/29/2017   Procedure: TRANSESOPHAGEAL ECHOCARDIOGRAM (TEE);  Surgeon: Fleeta Hanford, Coy, MD;  Location: [facility] OR;  Service: Open Heart Surgery;  Laterality: N/A;   TRANSFORAMINAL LUMBAR INTERBODY FUSION W/ MIS 1 LEVEL Right 07/12/2021   Procedure: Minimally Invasive Surgery Transforaminal Lumbar Interbody Fusion  Lumbar four-five;  Surgeon: Debby Dorn MATSU, MD;  Location: [facility] OR;  Service: Neurosurgery;  Laterality: Right;   TRANSTHORACIC ECHOCARDIOGRAM  06/06/2017   Mild to moderate reduced EF 40 and 45%.  Anterior septal, inferoseptal and basal to mid inferior hypokinesis.  GR 1 DD.  No significant valvular lesion     Allergies  Allergen Reactions   Amoxil [Amoxicillin] Anaphylaxis   Hydrocodone Anaphylaxis   Percocet [Oxycodone -Acetaminophen ] Other (See Comments)    Causes chest pain /tightness. Pressure pain around her ribs.   Depacon [Valproic Acid] Other (See Comments)    Causes falls    Prednisone Itching   Roxicodone  [Oxycodone ] Other (See Comments)    Chest pain, tightness      Family History  Problem Relation Age of Onset   Hypertension Mother    Lymphoma Mother    Depression Mother    Arthritis Father    Alcohol abuse Father    Hypertension Sister    Cancer Brother        kidney and lung   Alcohol abuse Brother    Alcohol abuse Paternal Uncle    Alcohol abuse Paternal Grandfather    Alcohol abuse Paternal Grandmother    Allergic rhinitis Neg Hx    Angioedema Neg Hx  Asthma Neg Hx    Atopy Neg Hx    Eczema Neg Hx    Immunodeficiency Neg Hx    Urticaria Neg Hx      Social History Ms. Liao reports that she quit smoking about 52 years ago. Her smoking use included cigarettes. She has never used smokeless tobacco. Ms. Giannone reports no history of alcohol use.      Physical  Examination Today's Vitals   11/12/23 1536  BP: 112/64  Pulse: 70  SpO2: 94%  Weight: 253 lb (114.8 kg)  Height: 5' (1.524 m)   Body mass index is 49.41 kg/m.  Gen: resting comfortably, no acute distress HEENT: no scleral icterus, pupils equal round and reactive, no palptable cervical adenopathy,  CV: RRR, no mrg, no jvd Resp: Clear to auscultation bilaterally GI: abdomen is soft, non-tender, non-distended, normal bowel sounds, no hepatosplenomegaly MSK: extremities are warm, no edema.  Skin: warm, no rash Neuro:  no focal deficits Psych: appropriate affect   Diagnostic Studies  03/2016 Lexiscan  Probable normal perfusion and mild soft tissue attenuation (breast) No significant ischemia or evidence for scar The left ventricular ejection fraction is normal (55-65%). Low risk scan.   08/2017 cath RPDA lesion is 80% stenosed. Ost 1st Mrg to 1st Mrg lesion is 90% stenosed. Prox LAD lesion is 95% stenosed. Prox LAD to Mid LAD lesion is 50% stenosed. Ost 1st Diag lesion is 90% stenosed. 1st Diag lesion is 80% stenosed. The left ventricular ejection fraction is 50-55% by visual estimate. There is mild left ventricular systolic dysfunction.   1.  Severe multivessel coronary artery disease with severe stenosis of the proximal LAD/first diagonal bifurcation, first obtuse marginal Norma [name] of the circumflex, and PDA Norma [name] of the RCA. 2.  Mild segmental LV systolic dysfunction with hypokinesis of the distal anterolateral and apical walls, LVEF estimated at 50 to 55%.   08/2017 TEE Aortic valve: The valve is trileaflet. No stenosis. No regurgitation. Mitral valve: Mild regurgitation. Central jet. Right ventricle: Normal cavity size, wall thickness and ejection fraction. Pericardium: Trivial pericardial effusion. Tricuspid valve: No regurgitation Pulmonic valve: No regurgitation by color doppler. Left ventricle: Regional wall motion abnormalities present, dyskinetic basal and apical  septal wall, hypokinesis of lateral wall and inferoseptal wall. Increased wall thickness. LVEF 35-40%. No ASD/PFO present Unable to rule out thrombus in LAA but velocities make thrombus less likely.   05/2017 echo Study Conclusions   - Left ventricle: The cavity size was normal. There was mild   concentric hypertrophy. Systolic function was mildly to   moderately reduced. The estimated ejection fraction was in the   range of 40% to 45%. Hypokinesis of the anteroseptal,   inferoseptal and basal to mid inferior myocardium. Doppler   parameters are consistent with abnormal left ventricular   relaxation (grade 1 diastolic dysfunction). Doppler parameters   are consistent with indeterminate ventricular filling pressure. - Aortic valve: Transvalvular velocity was within the normal range.   There was no stenosis. There was no regurgitation. - Mitral valve: Transvalvular velocity was within the normal range.   There was no evidence for stenosis. There was no regurgitation. - Right ventricle: The cavity size was normal. Wall thickness was   normal. Systolic function was normal. - Tricuspid valve: There was trivial regurgitation. - Global longitudinal strain -11.9%.     01/2018 echo Study Conclusions   - Left ventricle: The cavity size was normal. Wall thickness was   increased in a pattern of mild LVH. Systolic function was normal.  The estimated ejection fraction was in the range of 50% to 55%.   Regional wall motion abnormalities cannot be excluded. Doppler   parameters are consistent with abnormal left ventricular   relaxation (grade 1 diastolic dysfunction). - Ventricular septum: Septal motion showed abnormal function and   dyssynergy. These changes are consistent with a left bundle   Norma [name] block. - Mitral valve: Mildly thickened leaflets .     10/2020 echo IMPRESSIONS     1. Markedly abnormal septal motion ? from BBB. Left ventricular ejection  fraction, by estimation, is 50  to 55%. The left ventricle has low normal  function. The left ventricle has no regional wall motion abnormalities.  Left ventricular diastolic  parameters were normal.   2. Right ventricular systolic function is normal. The right ventricular  size is normal.   3. The mitral valve is normal in structure. Trivial mitral valve  regurgitation. No evidence of mitral stenosis.   4. The aortic valve is tricuspid. There is mild calcification of the  aortic valve. Aortic valve regurgitation is not visualized. Mild aortic  valve sclerosis is present, with no evidence of aortic valve stenosis.   5. The inferior vena cava is normal in size with greater than 50%  respiratory variability, suggesting right atrial pressure of 3 mmHg.      05/2022 echo 1. Left ventricular ejection fraction, by estimation, is 55 to 60%. The  left ventricle has normal function. The left ventricle has no regional  wall motion abnormalities. Left ventricular diastolic parameters were  normal. The average left ventricular  global longitudinal strain is -23.2 %. The global longitudinal strain is  normal.   2. Right ventricular systolic function is normal. The right ventricular  size is normal. There is normal pulmonary artery systolic pressure. The  estimated right ventricular systolic pressure is 28.4 mmHg.   3. The mitral valve is grossly normal. Trivial mitral valve  regurgitation.   4. The aortic valve is tricuspid. There is mild calcification of the  aortic valve. Aortic valve regurgitation is not visualized. Aortic valve  sclerosis/calcification is present, without any evidence of aortic  stenosis.   5. The inferior vena cava is normal in size with greater than 50%  respiratory variability, suggesting right atrial pressure of 3 mmHg.        Assessment and Plan   1. Chronic diastolic HF -appears euvolemic today, prior attempts at aggressive diuresis led to AKI - she reports neprhology had stopped her torsemide   due to worsening renal function     2.History of CAD - atypical chest pain that is chronic, continue to monitor  3. HLD - above goal, mixed compliance with repatha . Encouraged to take regularly.    4. Tremor - was changed from coreg  to propranolol . Ongoign tremor, can increase to 40mg  bid.     Dorn PHEBE Ross, M.D.

## 2023-11-13 ENCOUNTER — Encounter (HOSPITAL_COMMUNITY): Payer: Self-pay | Admitting: Psychiatry

## 2023-11-13 ENCOUNTER — Telehealth (HOSPITAL_COMMUNITY): Admitting: Psychiatry

## 2023-11-13 DIAGNOSIS — N1832 Chronic kidney disease, stage 3b: Secondary | ICD-10-CM | POA: Diagnosis not present

## 2023-11-13 DIAGNOSIS — E11621 Type 2 diabetes mellitus with foot ulcer: Secondary | ICD-10-CM | POA: Diagnosis not present

## 2023-11-13 DIAGNOSIS — F3131 Bipolar disorder, current episode depressed, mild: Secondary | ICD-10-CM

## 2023-11-13 DIAGNOSIS — L97521 Non-pressure chronic ulcer of other part of left foot limited to breakdown of skin: Secondary | ICD-10-CM | POA: Diagnosis not present

## 2023-11-13 DIAGNOSIS — E1122 Type 2 diabetes mellitus with diabetic chronic kidney disease: Secondary | ICD-10-CM | POA: Diagnosis not present

## 2023-11-13 DIAGNOSIS — E1165 Type 2 diabetes mellitus with hyperglycemia: Secondary | ICD-10-CM | POA: Diagnosis not present

## 2023-11-13 DIAGNOSIS — I130 Hypertensive heart and chronic kidney disease with heart failure and stage 1 through stage 4 chronic kidney disease, or unspecified chronic kidney disease: Secondary | ICD-10-CM | POA: Diagnosis not present

## 2023-11-13 DIAGNOSIS — E782 Mixed hyperlipidemia: Secondary | ICD-10-CM | POA: Diagnosis not present

## 2023-11-13 DIAGNOSIS — F331 Major depressive disorder, recurrent, moderate: Secondary | ICD-10-CM

## 2023-11-13 DIAGNOSIS — I10 Essential (primary) hypertension: Secondary | ICD-10-CM | POA: Diagnosis not present

## 2023-11-13 DIAGNOSIS — I509 Heart failure, unspecified: Secondary | ICD-10-CM | POA: Diagnosis not present

## 2023-11-13 DIAGNOSIS — I5032 Chronic diastolic (congestive) heart failure: Secondary | ICD-10-CM | POA: Diagnosis not present

## 2023-11-13 MED ORDER — LAMOTRIGINE 150 MG PO TABS: 150.0000 mg | ORAL_TABLET | Freq: Every day | ORAL | 2 refills | Status: DC

## 2023-11-13 MED ORDER — ARIPIPRAZOLE 5 MG PO TABS: 5.0000 mg | ORAL_TABLET | Freq: Every day | ORAL | 2 refills | Status: DC

## 2023-11-13 MED ORDER — BUPROPION HCL ER (XL) 150 MG PO TB24: 150.0000 mg | ORAL_TABLET | Freq: Every morning | ORAL | 2 refills | Status: DC

## 2023-11-13 MED ORDER — VENLAFAXINE HCL ER 150 MG PO CP24: ORAL_CAPSULE | ORAL | 2 refills | Status: DC

## 2023-11-13 NOTE — Progress Notes (Signed)
 Virtual Visit via Telephone Note  I connected with Norma [name] on 11/13/23 at 11:00 AM EDT by telephone and verified that I am speaking with the correct person using two identifiers.  Location: Patient: home Provider: office   I discussed the limitations, risks, security and privacy concerns of performing an evaluation and management service by telephone and the availability of in person appointments. I also discussed with the patient that there may be a patient responsible charge related to this service. The patient expressed understanding and agreed to proceed.     I discussed the assessment and treatment plan with the patient. The patient was provided an opportunity to ask questions and all were answered. The patient agreed with the plan and demonstrated an understanding of the instructions.   The patient was advised to call back or seek an in-person evaluation if the symptoms worsen or if the condition fails to improve as anticipated.  I provided 20 minutes of non-face-to-face time during this encounter.   Norma Gull, MD  [facility] MD/PA/NP OP Progress Note  11/13/2023 11:24 AM Norma [name]  MRN:  [id number]  Chief Complaint:  Chief Complaint  Patient presents with   Anxiety   Depression   Follow-up   HPI:  This patient is a 73 year old white female who is living with a roommate in [place].  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   The patient returns for follow-up after 3 months regarding her mood swings and depression.  The patient reports that she is doing fairly well in terms of mood.  She still has a diabetic foot ulcer on her left large toe that is not completely closed.  She is having frequent with visits with the wound care center.  She states that her mood has been good.  She is not allowed to walk much because of the ulcer and is going to try some chair exercises.  She is lost 10 pounds so far on Ozempic .  Her blood sugar is also coming down.  The patient  denies significant depression mood swings anxiety difficulty sleeping or racing thoughts.  She denies thoughts of self-harm or suicide.  She still feels that her medications are working well for her Visit Diagnosis:    ICD-10-CM   1. Bipolar affective disorder, currently depressed, mild (HCC)  F31.31     2. Moderate episode of recurrent major depressive disorder (HCC)  F33.1       Past Psychiatric History: She was hospitalized in 2001 and went through a course of ECT.  She has been tried on Serzone Effexor  Wellbutrin  and Zyprexa   Past Medical History:  Past Medical History:  Diagnosis Date   Anemia    Anxiety    Bipolar disorder (HCC)    Bulging lumbar disc    L3-4   Chronic daily headache    Chronic low back pain 09/20/2014   CKD (chronic kidney disease)    stage 3   COPD (chronic obstructive pulmonary disease) (HCC)    Coronary artery disease    CABG 2019   Degenerative arthritis    Depression    Diabetes mellitus, type II (HCC)    Diabetic neuropathy (HCC) 09/16/2018   DM type 2 with diabetic peripheral neuropathy (HCC) 09/20/2014   Dyslipidemia    Dyspnea    Family history of adverse reaction to anesthesia    father had reaction to anesthesia medication per patient they don't use it anymore- unsure of reaction   Fatty  liver    per pt report   Gastroparesis    GERD (gastroesophageal reflux disease)    Heart murmur    History of hiatal hernia    pt states she no longer has this   Hypertension    Hypothyroidism    IBS (irritable bowel syndrome)    LBBB (left bundle branch block)    Memory difficulty 09/20/2014   Morbid obesity (HCC)    Myocardial infarction (HCC)    per pt- prior to CABG   Neuropathy    Obstructive sleep apnea on CPAP    Renal insufficiency    Restless legs syndrome (RLS) 09/17/2012   Stroke (cerebrum) (HCC) 05/16/2017   Left parietal   Urticaria     Past Surgical History:  Procedure Laterality Date   ABDOMINAL HYSTERECTOMY      ARTHROSCOPY KNEE W/ DRILLING  06/2011   and Decemer of 2013.   Carpel tunnel  1980's   CATARACT EXTRACTION W/PHACO Right 03/21/2017   Procedure: CATARACT EXTRACTION PHACO AND INTRAOCULAR LENS PLACEMENT RIGHT EYE;  Surgeon: Harrie Agent, MD;  Location: [facility];  Service: Ophthalmology;  Laterality: Right;  CDE: 2.91    CATARACT EXTRACTION W/PHACO Left 04/18/2017   Procedure: CATARACT EXTRACTION PHACO AND INTRAOCULAR LENS PLACEMENT LEFT EYE;  Surgeon: Harrie Agent, MD;  Location: [facility];  Service: Ophthalmology;  Laterality: Left;  left   CHOLECYSTECTOMY     COLONOSCOPY WITH PROPOFOL  N/A 10/04/2016   Procedure: COLONOSCOPY WITH PROPOFOL ;  Surgeon: Golda Claudis PENNER, MD;  Location: [facility];  Service: Endoscopy;  Laterality: N/A;  11:10   CORONARY ARTERY BYPASS GRAFT N/A 08/29/2017   Procedure: CORONARY ARTERY BYPASS GRAFTING (CABG) x 3 WITH ENDOSCOPIC HARVESTING OF RIGHT SAPHENOUS VEIN;  Surgeon: Fleeta Hanford Coy, MD;  Location: [facility] OR;  Service: Open Heart Surgery;  Laterality: N/A;   ESOPHAGOGASTRODUODENOSCOPY N/A 04/25/2016   Procedure: ESOPHAGOGASTRODUODENOSCOPY (EGD);  Surgeon: Claudis PENNER Golda, MD;  Location: [facility];  Service: Endoscopy;  Laterality: N/A;  730   ESOPHAGOGASTRODUODENOSCOPY  03/2023   LEFT HEART CATH AND CORONARY ANGIOGRAPHY N/A 08/27/2017   Procedure: LEFT HEART CATH AND CORONARY ANGIOGRAPHY;  Surgeon: Wonda Sharper, MD;  Location: [facility] INVASIVE CV LAB;  Service: Cardiovascular::  pLAD 95% - p-mLAD 50%, ostD1 90% -pD1 80%; ostOM1 90%; rPDA 80%. EF ~50-55% - HK of dital Anterolateral & Apical wall.  - Rec CVTS c/s   NECK SURGERY     pt reports having growth removed from the back of her neck- after 2021   OPEN REDUCTION INTERNAL FIXATION (ORIF) HAND Left 04/10/2020   Procedure: OPEN REDUCTION INTERNAL FIXATION (ORIF) HAND, left index finger;  Surgeon: Margrette Taft BRAVO, MD;  Location: [facility];  Service: Orthopedics;  Laterality: Left;  0.045 k wires   RECTAL  SURGERY     fissure   SHOULDER SURGERY Left    arthroscopy in March of this year   TEE WITHOUT CARDIOVERSION N/A 08/29/2017   Procedure: TRANSESOPHAGEAL ECHOCARDIOGRAM (TEE);  Surgeon: Fleeta Hanford, Coy, MD;  Location: [facility] OR;  Service: Open Heart Surgery;  Laterality: N/A;   TRANSFORAMINAL LUMBAR INTERBODY FUSION W/ MIS 1 LEVEL Right 07/12/2021   Procedure: Minimally Invasive Surgery Transforaminal Lumbar Interbody Fusion  Lumbar four-five;  Surgeon: Debby Dorn MATSU, MD;  Location: [facility] OR;  Service: Neurosurgery;  Laterality: Right;   TRANSTHORACIC ECHOCARDIOGRAM  06/06/2017   Mild to moderate reduced EF 40 and 45%.  Anterior septal, inferoseptal and basal to mid inferior hypokinesis.  GR 1 DD.  No significant valvular lesion    Family Psychiatric History: See below  Family History:  Family History  Problem Relation Age of Onset   Hypertension Mother    Lymphoma Mother    Depression Mother    Arthritis Father    Alcohol abuse Father    Hypertension Sister    Cancer Brother        kidney and lung   Alcohol abuse Brother    Alcohol abuse Paternal Uncle    Alcohol abuse Paternal Grandfather    Alcohol abuse Paternal Grandmother    Allergic rhinitis Neg Hx    Angioedema Neg Hx    Asthma Neg Hx    Atopy Neg Hx    Eczema Neg Hx    Immunodeficiency Neg Hx    Urticaria Neg Hx     Social History:  Social History   Socioeconomic History   Marital status: Single    Spouse name: Not on file   Number of children: 0   Years of education: 16   Highest education level: Not on file  Occupational History    Employer: [employer]  Tobacco Use   Smoking status: Former    Current packs/day: 0.00    Types: Cigarettes    Quit date: 02/04/1971    Years since quitting: 52.8   Smokeless tobacco: Never   Tobacco comments:    smoked 2 cigarettes a day  Vaping Use   Vaping status: Never Used  Substance and Sexual Activity   Alcohol use: No    Alcohol/week: 0.0 standard  drinks of alcohol   Drug use: No   Sexual activity: Never  Other Topics Concern   Not on file  Social History Narrative   Patient lives at home alone.    Patient has no children.    Patient has her masters in nursing.    Patient is single.    Patient drinks about 2 glasses of tea daily.   Patient is right handed.   Social Drivers of Health   Financial Resource Strain: Medium Risk (12/18/2021)   Overall Financial Resource Strain (CARDIA)    Difficulty of Paying Living Expenses: Somewhat hard  Food Insecurity: No Food Insecurity (04/13/2023)   Hunger Vital Sign    Worried About Running Out of Food in the Last Year: Never true    Ran Out of Food in the Last Year: Never true  Transportation Needs: No Transportation Needs (04/13/2023)   PRAPARE - Administrator, Civil Service (Medical): No    Lack of Transportation (Non-Medical): No  Physical Activity: Not on file  Stress: No Stress Concern Present (10/04/2021)   [employer] of Occupational Health - Occupational Stress Questionnaire    Feeling of Stress : Only a little  Social Connections: Not on file    Allergies:  Allergies  Allergen Reactions   Amoxil [Amoxicillin] Anaphylaxis   Hydrocodone Anaphylaxis   Percocet [Oxycodone -Acetaminophen ] Other (See Comments)    Causes chest pain /tightness. Pressure pain around her ribs.   Depacon [Valproic Acid] Other (See Comments)    Causes falls    Prednisone Itching   Roxicodone  [Oxycodone ] Other (See Comments)    Chest pain, tightness    Metabolic Disorder Labs: Lab Results  Component Value Date   HGBA1C 7.7 04/22/2023   MPG 165.68 07/05/2021   MPG 148 03/05/2019   No results found for: PROLACTIN Lab Results  Component Value Date   CHOL 173 04/22/2023   TRIG 147 04/22/2023  HDL 41 04/22/2023   CHOLHDL 3.8 03/05/2019   VLDL 31 (H) 07/10/2016   LDLCALC 106 04/22/2023   LDLCALC 62 10/09/2022   Lab Results  Component Value Date   TSH 1.79 04/23/2023    TSH 1.79 04/22/2023    Therapeutic Level Labs: No results found for: LITHIUM No results found for: VALPROATE No results found for: CBMZ  Current Medications: Current Outpatient Medications  Medication Sig Dispense Refill   acetaminophen  (TYLENOL ) 500 MG tablet Take 1,000 mg by mouth daily as needed for moderate pain (pain score 4-6), fever or headache.     ARIPiprazole  (ABILIFY ) 5 MG tablet Take 1 tablet (5 mg total) by mouth daily. 30 tablet 2   aspirin  EC 81 MG tablet Take 1 tablet (81 mg total) by mouth daily. 90 tablet 3   azelastine (ASTELIN) 0.1 % nasal spray Place 1 spray into both nostrils 2 (two) times daily as needed for allergies.     buPROPion  (WELLBUTRIN  XL) 150 MG 24 hr tablet Take 1 tablet (150 mg total) by mouth every morning. 30 tablet 2   cetirizine  (ZYRTEC ) 10 MG chewable tablet Chew 10 mg by mouth daily.     Cholecalciferol  (VITAMIN D3) 50 MCG (2000 UT) capsule Take 1 capsule (2,000 Units total) by mouth daily with lunch. 90 capsule 3   diclofenac Sodium (VOLTAREN) 1 % GEL Apply 2 g topically 4 (four) times daily as needed (pain).     EASY TOUCH PEN NEEDLES 31G X 6 MM MISC USE AS DIRECTED with Toujeo  (she wants 6mm) 100 each 2   EPINEPHrine  0.3 mg/0.3 mL IJ SOAJ injection Inject 0.3 mg into the muscle as needed.     Evolocumab  (REPATHA  SURECLICK) 140 MG/ML SOAJ Inject 140 mg into the skin every 14 (fourteen) days. 6 mL 1   gabapentin  (NEURONTIN ) 400 MG capsule Take 400-800 mg by mouth See admin instructions. Take 1 capsule (400mg ) every morning and 2 capsules (800mg ) at bedtime.     insulin  glargine, 1 Unit Dial, (TOUJEO  SOLOSTAR) 300 UNIT/ML Solostar Pen Inject 50 Units into the skin at bedtime. 13.5 mL 2   JARDIANCE  10 MG TABS tablet TAKE 1 TABLET (10 MG TOTAL) BY MOUTH DAILY WITH BREAKFAST. 90 tablet 2   lamoTRIgine  (LAMICTAL ) 150 MG tablet Take 1 tablet (150 mg total) by mouth at bedtime. 30 tablet 2   levothyroxine  (SYNTHROID ) 112 MCG tablet Take 112 mcg by  mouth daily.     linaclotide (LINZESS) 145 MCG CAPS capsule Take 145 mcg by mouth daily before breakfast.     losartan  (COZAAR ) 25 MG tablet Take 25 mg by mouth daily.     pantoprazole  (PROTONIX ) 40 MG tablet TAKE 1 TABLET BY MOUTH AT BEDTIME 90 tablet 2   propranolol  (INDERAL ) 40 MG tablet Take 1 tablet (40 mg total) by mouth 2 (two) times daily. 60 tablet 6   rOPINIRole  (REQUIP ) 3 MG tablet TAKE ONE TABLET BY MOUTH EVERY MORNING, ,NOON AND AT BEDTIME. (PRT PT MORNING,EVENING,BEDTIME) (Patient taking differently: Take 3-6 mg by mouth See admin instructions. Take 1 tablet (3mg ) every morning and 2 tablets (6mg ) at bedtime.) 270 tablet 3   Semaglutide , 1 MG/DOSE, 4 MG/3ML SOPN Inject 1 mg as directed once a week. 9 mL 0   tiZANidine  (ZANAFLEX ) 2 MG tablet Take 2 mg by mouth 2 (two) times daily.     triamcinolone  cream (KENALOG) 0.1 % SMARTSIG:sparingly Topical Twice Daily     venlafaxine  XR (EFFEXOR -XR) 150 MG 24 hr capsule TAKE TWO (  2) CAPSULES BY MOUTH AT BEDTIME 60 capsule 2   No current facility-administered medications for this visit.     Musculoskeletal: Strength & Muscle Tone: na Gait & Station: na Patient leans: N/A  Psychiatric Specialty Exam: Review of Systems  Musculoskeletal:  Positive for arthralgias.  Neurological:  Positive for numbness.  All other systems reviewed and are negative.   There were no vitals taken for this visit.There is no height or weight on file to calculate BMI.  General Appearance: NA  Eye Contact:  NA  Speech:  Clear and Coherent  Volume:  Normal  Mood:  Euthymic  Affect:  Congruent  Thought Process:  Goal Directed  Orientation:  Full (Time, Place, and Person)  Thought Content: WDL   Suicidal Thoughts:  No  Homicidal Thoughts:  No  Memory:  Immediate;   Good Recent;   Good Remote;   NA  Judgement:  Good  Insight:  Good  Psychomotor Activity:  Decreased  Concentration:  Concentration: Good and Attention Span: Good  Recall:  Good  Fund of  Knowledge: Good  Language: Good  Akathisia:  No  Handed:  Right  AIMS (if indicated): not done  Assets:  Communication Skills Desire for Improvement Physical Health Resilience Social Support Talents/Skills  ADL's:  Intact  Cognition: WNL  Sleep:  Good   Screenings: Mini-Mental    Flowsheet Row Office Visit from 02/18/2018 in [facility] Office Visit from 09/17/2017 in [facility]  Total Score (max 30 points ) 28 28   PHQ2-9    Flowsheet Row Video Visit from 08/13/2022 in [facility] [place] Video Visit from 01/21/2022 in [facility] [place] Video Visit from 11/26/2021 in [facility] at [facility] Coordination from 11/14/2021 in [facility] Patient Outreach Telephone from 10/04/2021 in [facility]  PHQ-2 Total Score 1 1 4 2 1   PHQ-9 Total Score -- -- 9 11 --   Flowsheet Row ED to Hosp-Admission (Discharged) from 04/13/2023 in [place] [facility] Admission (Discharged) from 04/10/2023 in [facility] Video Visit from 08/13/2022 in [facility] at [place]  C-SSRS RISK CATEGORY No Risk No Risk No Risk     Assessment and Plan: This patient is a 73 year old female with a history of depression anxiety mood swings and insomnia.  She is doing well on her current regimen.  She will continue Wellbutrin  XL 150 mg daily as well as Effexor  XR 300 mg daily for depression, Abilify  5 mg daily for augmentation and Lamictal  100 mg daily for mood stabilization.  She will return to see me in 3 months  Collaboration of Care: Collaboration of Care: Primary Care Provider AEB notes to be shared with PCP at patient's request  Patient/Guardian was advised Release of Information must be obtained prior to any record release  in order to collaborate their care with an outside provider. Patient/Guardian was advised if they have not already done so to contact the registration department to sign all necessary forms in order for us  to release information regarding their care.   Consent: Patient/Guardian gives verbal consent for treatment and assignment of benefits for services provided during this visit. Patient/Guardian expressed understanding and agreed to proceed.    Norma Gull, MD 11/13/2023, 11:24 AM

## 2023-11-17 DIAGNOSIS — I509 Heart failure, unspecified: Secondary | ICD-10-CM | POA: Diagnosis not present

## 2023-11-17 DIAGNOSIS — E1122 Type 2 diabetes mellitus with diabetic chronic kidney disease: Secondary | ICD-10-CM | POA: Diagnosis not present

## 2023-11-17 DIAGNOSIS — I130 Hypertensive heart and chronic kidney disease with heart failure and stage 1 through stage 4 chronic kidney disease, or unspecified chronic kidney disease: Secondary | ICD-10-CM | POA: Diagnosis not present

## 2023-11-17 DIAGNOSIS — N1832 Chronic kidney disease, stage 3b: Secondary | ICD-10-CM | POA: Diagnosis not present

## 2023-11-17 DIAGNOSIS — L97521 Non-pressure chronic ulcer of other part of left foot limited to breakdown of skin: Secondary | ICD-10-CM | POA: Diagnosis not present

## 2023-11-17 DIAGNOSIS — E11621 Type 2 diabetes mellitus with foot ulcer: Secondary | ICD-10-CM | POA: Diagnosis not present

## 2023-11-19 DIAGNOSIS — J449 Chronic obstructive pulmonary disease, unspecified: Secondary | ICD-10-CM | POA: Diagnosis not present

## 2023-11-19 DIAGNOSIS — E11621 Type 2 diabetes mellitus with foot ulcer: Secondary | ICD-10-CM | POA: Diagnosis not present

## 2023-11-19 DIAGNOSIS — I110 Hypertensive heart disease with heart failure: Secondary | ICD-10-CM | POA: Diagnosis not present

## 2023-11-19 DIAGNOSIS — I509 Heart failure, unspecified: Secondary | ICD-10-CM | POA: Diagnosis not present

## 2023-11-19 DIAGNOSIS — L97521 Non-pressure chronic ulcer of other part of left foot limited to breakdown of skin: Secondary | ICD-10-CM | POA: Diagnosis not present

## 2023-11-19 DIAGNOSIS — E1140 Type 2 diabetes mellitus with diabetic neuropathy, unspecified: Secondary | ICD-10-CM | POA: Diagnosis not present

## 2023-11-19 NOTE — Telephone Encounter (Signed)
 PAP oz ozempic  was picked up by Nathanel

## 2023-11-21 ENCOUNTER — Other Ambulatory Visit: Payer: Self-pay | Admitting: Internal Medicine

## 2023-11-21 DIAGNOSIS — E782 Mixed hyperlipidemia: Secondary | ICD-10-CM | POA: Diagnosis not present

## 2023-11-21 DIAGNOSIS — E039 Hypothyroidism, unspecified: Secondary | ICD-10-CM | POA: Diagnosis not present

## 2023-11-21 DIAGNOSIS — N1832 Chronic kidney disease, stage 3b: Secondary | ICD-10-CM | POA: Diagnosis not present

## 2023-11-21 DIAGNOSIS — I509 Heart failure, unspecified: Secondary | ICD-10-CM | POA: Diagnosis not present

## 2023-11-21 DIAGNOSIS — E1165 Type 2 diabetes mellitus with hyperglycemia: Secondary | ICD-10-CM | POA: Diagnosis not present

## 2023-11-21 DIAGNOSIS — I25110 Atherosclerotic heart disease of native coronary artery with unstable angina pectoris: Secondary | ICD-10-CM

## 2023-11-21 DIAGNOSIS — F3181 Bipolar II disorder: Secondary | ICD-10-CM | POA: Diagnosis not present

## 2023-11-21 DIAGNOSIS — R251 Tremor, unspecified: Secondary | ICD-10-CM | POA: Diagnosis not present

## 2023-11-21 DIAGNOSIS — I130 Hypertensive heart and chronic kidney disease with heart failure and stage 1 through stage 4 chronic kidney disease, or unspecified chronic kidney disease: Secondary | ICD-10-CM | POA: Diagnosis not present

## 2023-11-21 DIAGNOSIS — L97521 Non-pressure chronic ulcer of other part of left foot limited to breakdown of skin: Secondary | ICD-10-CM | POA: Diagnosis not present

## 2023-11-21 DIAGNOSIS — K219 Gastro-esophageal reflux disease without esophagitis: Secondary | ICD-10-CM | POA: Diagnosis not present

## 2023-11-21 DIAGNOSIS — E1122 Type 2 diabetes mellitus with diabetic chronic kidney disease: Secondary | ICD-10-CM | POA: Diagnosis not present

## 2023-11-21 DIAGNOSIS — J309 Allergic rhinitis, unspecified: Secondary | ICD-10-CM | POA: Diagnosis not present

## 2023-11-21 DIAGNOSIS — S31109D Unspecified open wound of abdominal wall, unspecified quadrant without penetration into peritoneal cavity, subsequent encounter: Secondary | ICD-10-CM | POA: Diagnosis not present

## 2023-11-21 DIAGNOSIS — I5032 Chronic diastolic (congestive) heart failure: Secondary | ICD-10-CM | POA: Diagnosis not present

## 2023-11-21 DIAGNOSIS — I10 Essential (primary) hypertension: Secondary | ICD-10-CM | POA: Diagnosis not present

## 2023-11-21 DIAGNOSIS — E11621 Type 2 diabetes mellitus with foot ulcer: Secondary | ICD-10-CM | POA: Diagnosis not present

## 2023-11-21 DIAGNOSIS — I2510 Atherosclerotic heart disease of native coronary artery without angina pectoris: Secondary | ICD-10-CM | POA: Diagnosis not present

## 2023-11-24 DIAGNOSIS — E11621 Type 2 diabetes mellitus with foot ulcer: Secondary | ICD-10-CM | POA: Diagnosis not present

## 2023-11-24 DIAGNOSIS — N1832 Chronic kidney disease, stage 3b: Secondary | ICD-10-CM | POA: Diagnosis not present

## 2023-11-24 DIAGNOSIS — E1122 Type 2 diabetes mellitus with diabetic chronic kidney disease: Secondary | ICD-10-CM | POA: Diagnosis not present

## 2023-11-24 DIAGNOSIS — I130 Hypertensive heart and chronic kidney disease with heart failure and stage 1 through stage 4 chronic kidney disease, or unspecified chronic kidney disease: Secondary | ICD-10-CM | POA: Diagnosis not present

## 2023-11-24 DIAGNOSIS — I509 Heart failure, unspecified: Secondary | ICD-10-CM | POA: Diagnosis not present

## 2023-11-24 DIAGNOSIS — L97521 Non-pressure chronic ulcer of other part of left foot limited to breakdown of skin: Secondary | ICD-10-CM | POA: Diagnosis not present

## 2023-11-25 DIAGNOSIS — N1832 Chronic kidney disease, stage 3b: Secondary | ICD-10-CM | POA: Diagnosis not present

## 2023-11-25 DIAGNOSIS — I10 Essential (primary) hypertension: Secondary | ICD-10-CM | POA: Diagnosis not present

## 2023-11-26 DIAGNOSIS — L97521 Non-pressure chronic ulcer of other part of left foot limited to breakdown of skin: Secondary | ICD-10-CM | POA: Diagnosis not present

## 2023-11-26 DIAGNOSIS — I509 Heart failure, unspecified: Secondary | ICD-10-CM | POA: Diagnosis not present

## 2023-11-26 DIAGNOSIS — E11621 Type 2 diabetes mellitus with foot ulcer: Secondary | ICD-10-CM | POA: Diagnosis not present

## 2023-11-26 DIAGNOSIS — L97421 Non-pressure chronic ulcer of left heel and midfoot limited to breakdown of skin: Secondary | ICD-10-CM | POA: Diagnosis not present

## 2023-11-26 DIAGNOSIS — E1140 Type 2 diabetes mellitus with diabetic neuropathy, unspecified: Secondary | ICD-10-CM | POA: Diagnosis not present

## 2023-11-26 DIAGNOSIS — J449 Chronic obstructive pulmonary disease, unspecified: Secondary | ICD-10-CM | POA: Diagnosis not present

## 2023-11-26 DIAGNOSIS — I110 Hypertensive heart disease with heart failure: Secondary | ICD-10-CM | POA: Diagnosis not present

## 2023-11-28 DIAGNOSIS — I509 Heart failure, unspecified: Secondary | ICD-10-CM | POA: Diagnosis not present

## 2023-11-28 DIAGNOSIS — N1832 Chronic kidney disease, stage 3b: Secondary | ICD-10-CM | POA: Diagnosis not present

## 2023-11-28 DIAGNOSIS — E1122 Type 2 diabetes mellitus with diabetic chronic kidney disease: Secondary | ICD-10-CM | POA: Diagnosis not present

## 2023-11-28 DIAGNOSIS — L97521 Non-pressure chronic ulcer of other part of left foot limited to breakdown of skin: Secondary | ICD-10-CM | POA: Diagnosis not present

## 2023-11-28 DIAGNOSIS — I130 Hypertensive heart and chronic kidney disease with heart failure and stage 1 through stage 4 chronic kidney disease, or unspecified chronic kidney disease: Secondary | ICD-10-CM | POA: Diagnosis not present

## 2023-11-28 DIAGNOSIS — E11621 Type 2 diabetes mellitus with foot ulcer: Secondary | ICD-10-CM | POA: Diagnosis not present

## 2023-12-01 DIAGNOSIS — N2581 Secondary hyperparathyroidism of renal origin: Secondary | ICD-10-CM | POA: Diagnosis not present

## 2023-12-01 DIAGNOSIS — E875 Hyperkalemia: Secondary | ICD-10-CM | POA: Diagnosis not present

## 2023-12-01 DIAGNOSIS — E11621 Type 2 diabetes mellitus with foot ulcer: Secondary | ICD-10-CM | POA: Diagnosis not present

## 2023-12-01 DIAGNOSIS — N1832 Chronic kidney disease, stage 3b: Secondary | ICD-10-CM | POA: Diagnosis not present

## 2023-12-01 DIAGNOSIS — I129 Hypertensive chronic kidney disease with stage 1 through stage 4 chronic kidney disease, or unspecified chronic kidney disease: Secondary | ICD-10-CM | POA: Diagnosis not present

## 2023-12-01 DIAGNOSIS — L97521 Non-pressure chronic ulcer of other part of left foot limited to breakdown of skin: Secondary | ICD-10-CM | POA: Diagnosis not present

## 2023-12-01 DIAGNOSIS — E1122 Type 2 diabetes mellitus with diabetic chronic kidney disease: Secondary | ICD-10-CM | POA: Diagnosis not present

## 2023-12-01 DIAGNOSIS — I130 Hypertensive heart and chronic kidney disease with heart failure and stage 1 through stage 4 chronic kidney disease, or unspecified chronic kidney disease: Secondary | ICD-10-CM | POA: Diagnosis not present

## 2023-12-01 DIAGNOSIS — I509 Heart failure, unspecified: Secondary | ICD-10-CM | POA: Diagnosis not present

## 2023-12-02 DIAGNOSIS — E1142 Type 2 diabetes mellitus with diabetic polyneuropathy: Secondary | ICD-10-CM | POA: Diagnosis not present

## 2023-12-02 DIAGNOSIS — M79674 Pain in right toe(s): Secondary | ICD-10-CM | POA: Diagnosis not present

## 2023-12-02 DIAGNOSIS — L84 Corns and callosities: Secondary | ICD-10-CM | POA: Diagnosis not present

## 2023-12-02 DIAGNOSIS — M79675 Pain in left toe(s): Secondary | ICD-10-CM | POA: Diagnosis not present

## 2023-12-02 DIAGNOSIS — B351 Tinea unguium: Secondary | ICD-10-CM | POA: Diagnosis not present

## 2023-12-03 DIAGNOSIS — R918 Other nonspecific abnormal finding of lung field: Secondary | ICD-10-CM | POA: Diagnosis not present

## 2023-12-03 DIAGNOSIS — Z79899 Other long term (current) drug therapy: Secondary | ICD-10-CM | POA: Diagnosis not present

## 2023-12-03 DIAGNOSIS — J90 Pleural effusion, not elsewhere classified: Secondary | ICD-10-CM | POA: Diagnosis not present

## 2023-12-03 DIAGNOSIS — Z981 Arthrodesis status: Secondary | ICD-10-CM | POA: Diagnosis not present

## 2023-12-03 DIAGNOSIS — Z880 Allergy status to penicillin: Secondary | ICD-10-CM | POA: Diagnosis not present

## 2023-12-03 DIAGNOSIS — J189 Pneumonia, unspecified organism: Secondary | ICD-10-CM | POA: Diagnosis not present

## 2023-12-03 DIAGNOSIS — R069 Unspecified abnormalities of breathing: Secondary | ICD-10-CM | POA: Diagnosis not present

## 2023-12-03 DIAGNOSIS — Z4682 Encounter for fitting and adjustment of non-vascular catheter: Secondary | ICD-10-CM | POA: Diagnosis not present

## 2023-12-03 DIAGNOSIS — Z1152 Encounter for screening for COVID-19: Secondary | ICD-10-CM | POA: Diagnosis not present

## 2023-12-03 DIAGNOSIS — J441 Chronic obstructive pulmonary disease with (acute) exacerbation: Secondary | ICD-10-CM | POA: Diagnosis not present

## 2023-12-03 DIAGNOSIS — R062 Wheezing: Secondary | ICD-10-CM | POA: Diagnosis not present

## 2023-12-03 DIAGNOSIS — Z87891 Personal history of nicotine dependence: Secondary | ICD-10-CM | POA: Diagnosis not present

## 2023-12-03 DIAGNOSIS — E1140 Type 2 diabetes mellitus with diabetic neuropathy, unspecified: Secondary | ICD-10-CM | POA: Diagnosis not present

## 2023-12-03 DIAGNOSIS — I509 Heart failure, unspecified: Secondary | ICD-10-CM | POA: Diagnosis not present

## 2023-12-03 DIAGNOSIS — A419 Sepsis, unspecified organism: Secondary | ICD-10-CM | POA: Diagnosis not present

## 2023-12-03 DIAGNOSIS — R0603 Acute respiratory distress: Secondary | ICD-10-CM | POA: Diagnosis not present

## 2023-12-03 DIAGNOSIS — J811 Chronic pulmonary edema: Secondary | ICD-10-CM | POA: Diagnosis not present

## 2023-12-03 DIAGNOSIS — Z885 Allergy status to narcotic agent status: Secondary | ICD-10-CM | POA: Diagnosis not present

## 2023-12-03 DIAGNOSIS — Z7982 Long term (current) use of aspirin: Secondary | ICD-10-CM | POA: Diagnosis not present

## 2023-12-03 DIAGNOSIS — I110 Hypertensive heart disease with heart failure: Secondary | ICD-10-CM | POA: Diagnosis not present

## 2023-12-04 DIAGNOSIS — J440 Chronic obstructive pulmonary disease with acute lower respiratory infection: Secondary | ICD-10-CM | POA: Diagnosis not present

## 2023-12-04 DIAGNOSIS — R918 Other nonspecific abnormal finding of lung field: Secondary | ICD-10-CM | POA: Diagnosis not present

## 2023-12-04 DIAGNOSIS — E872 Acidosis, unspecified: Secondary | ICD-10-CM | POA: Diagnosis not present

## 2023-12-04 DIAGNOSIS — Z604 Social exclusion and rejection: Secondary | ICD-10-CM | POA: Diagnosis not present

## 2023-12-04 DIAGNOSIS — E1121 Type 2 diabetes mellitus with diabetic nephropathy: Secondary | ICD-10-CM | POA: Diagnosis not present

## 2023-12-04 DIAGNOSIS — M1990 Unspecified osteoarthritis, unspecified site: Secondary | ICD-10-CM | POA: Diagnosis not present

## 2023-12-04 DIAGNOSIS — I447 Left bundle-branch block, unspecified: Secondary | ICD-10-CM | POA: Diagnosis not present

## 2023-12-04 DIAGNOSIS — Z9989 Dependence on other enabling machines and devices: Secondary | ICD-10-CM | POA: Diagnosis not present

## 2023-12-04 DIAGNOSIS — D509 Iron deficiency anemia, unspecified: Secondary | ICD-10-CM | POA: Diagnosis not present

## 2023-12-04 DIAGNOSIS — Z87891 Personal history of nicotine dependence: Secondary | ICD-10-CM | POA: Diagnosis not present

## 2023-12-04 DIAGNOSIS — F419 Anxiety disorder, unspecified: Secondary | ICD-10-CM | POA: Diagnosis present

## 2023-12-04 DIAGNOSIS — J9611 Chronic respiratory failure with hypoxia: Secondary | ICD-10-CM | POA: Diagnosis not present

## 2023-12-04 DIAGNOSIS — N1832 Chronic kidney disease, stage 3b: Secondary | ICD-10-CM | POA: Diagnosis not present

## 2023-12-04 DIAGNOSIS — D1724 Benign lipomatous neoplasm of skin and subcutaneous tissue of left leg: Secondary | ICD-10-CM | POA: Diagnosis not present

## 2023-12-04 DIAGNOSIS — E785 Hyperlipidemia, unspecified: Secondary | ICD-10-CM | POA: Diagnosis not present

## 2023-12-04 DIAGNOSIS — J449 Chronic obstructive pulmonary disease, unspecified: Secondary | ICD-10-CM | POA: Diagnosis not present

## 2023-12-04 DIAGNOSIS — E1142 Type 2 diabetes mellitus with diabetic polyneuropathy: Secondary | ICD-10-CM | POA: Diagnosis not present

## 2023-12-04 DIAGNOSIS — J90 Pleural effusion, not elsewhere classified: Secondary | ICD-10-CM | POA: Diagnosis not present

## 2023-12-04 DIAGNOSIS — J22 Unspecified acute lower respiratory infection: Secondary | ICD-10-CM | POA: Diagnosis not present

## 2023-12-04 DIAGNOSIS — K760 Fatty (change of) liver, not elsewhere classified: Secondary | ICD-10-CM | POA: Diagnosis not present

## 2023-12-04 DIAGNOSIS — Z4682 Encounter for fitting and adjustment of non-vascular catheter: Secondary | ICD-10-CM | POA: Diagnosis not present

## 2023-12-04 DIAGNOSIS — L97521 Non-pressure chronic ulcer of other part of left foot limited to breakdown of skin: Secondary | ICD-10-CM | POA: Diagnosis not present

## 2023-12-04 DIAGNOSIS — E1140 Type 2 diabetes mellitus with diabetic neuropathy, unspecified: Secondary | ICD-10-CM | POA: Diagnosis present

## 2023-12-04 DIAGNOSIS — Z8673 Personal history of transient ischemic attack (TIA), and cerebral infarction without residual deficits: Secondary | ICD-10-CM | POA: Diagnosis not present

## 2023-12-04 DIAGNOSIS — N183 Chronic kidney disease, stage 3 unspecified: Secondary | ICD-10-CM | POA: Diagnosis not present

## 2023-12-04 DIAGNOSIS — Z7984 Long term (current) use of oral hypoglycemic drugs: Secondary | ICD-10-CM | POA: Diagnosis not present

## 2023-12-04 DIAGNOSIS — E66813 Obesity, class 3: Secondary | ICD-10-CM | POA: Diagnosis not present

## 2023-12-04 DIAGNOSIS — E039 Hypothyroidism, unspecified: Secondary | ICD-10-CM | POA: Diagnosis present

## 2023-12-04 DIAGNOSIS — I25810 Atherosclerosis of coronary artery bypass graft(s) without angina pectoris: Secondary | ICD-10-CM | POA: Diagnosis not present

## 2023-12-04 DIAGNOSIS — D631 Anemia in chronic kidney disease: Secondary | ICD-10-CM | POA: Diagnosis not present

## 2023-12-04 DIAGNOSIS — Z7982 Long term (current) use of aspirin: Secondary | ICD-10-CM | POA: Diagnosis not present

## 2023-12-04 DIAGNOSIS — Z794 Long term (current) use of insulin: Secondary | ICD-10-CM | POA: Diagnosis not present

## 2023-12-04 DIAGNOSIS — I959 Hypotension, unspecified: Secondary | ICD-10-CM | POA: Diagnosis not present

## 2023-12-04 DIAGNOSIS — Z743 Need for continuous supervision: Secondary | ICD-10-CM | POA: Diagnosis not present

## 2023-12-04 DIAGNOSIS — E1143 Type 2 diabetes mellitus with diabetic autonomic (poly)neuropathy: Secondary | ICD-10-CM | POA: Diagnosis not present

## 2023-12-04 DIAGNOSIS — Z6841 Body Mass Index (BMI) 40.0 and over, adult: Secondary | ICD-10-CM | POA: Diagnosis not present

## 2023-12-04 DIAGNOSIS — R531 Weakness: Secondary | ICD-10-CM | POA: Diagnosis not present

## 2023-12-04 DIAGNOSIS — G4730 Sleep apnea, unspecified: Secondary | ICD-10-CM | POA: Diagnosis not present

## 2023-12-04 DIAGNOSIS — J9601 Acute respiratory failure with hypoxia: Secondary | ICD-10-CM | POA: Diagnosis present

## 2023-12-04 DIAGNOSIS — J441 Chronic obstructive pulmonary disease with (acute) exacerbation: Secondary | ICD-10-CM | POA: Diagnosis not present

## 2023-12-04 DIAGNOSIS — A419 Sepsis, unspecified organism: Secondary | ICD-10-CM | POA: Diagnosis not present

## 2023-12-04 DIAGNOSIS — Z862 Personal history of diseases of the blood and blood-forming organs and certain disorders involving the immune mechanism: Secondary | ICD-10-CM | POA: Diagnosis not present

## 2023-12-04 DIAGNOSIS — E11621 Type 2 diabetes mellitus with foot ulcer: Secondary | ICD-10-CM | POA: Diagnosis not present

## 2023-12-04 DIAGNOSIS — I110 Hypertensive heart disease with heart failure: Secondary | ICD-10-CM | POA: Diagnosis not present

## 2023-12-04 DIAGNOSIS — Z9981 Dependence on supplemental oxygen: Secondary | ICD-10-CM | POA: Diagnosis not present

## 2023-12-04 DIAGNOSIS — G4733 Obstructive sleep apnea (adult) (pediatric): Secondary | ICD-10-CM | POA: Diagnosis present

## 2023-12-04 DIAGNOSIS — I517 Cardiomegaly: Secondary | ICD-10-CM | POA: Diagnosis not present

## 2023-12-04 DIAGNOSIS — E038 Other specified hypothyroidism: Secondary | ICD-10-CM | POA: Diagnosis not present

## 2023-12-04 DIAGNOSIS — I5032 Chronic diastolic (congestive) heart failure: Secondary | ICD-10-CM | POA: Diagnosis present

## 2023-12-04 DIAGNOSIS — J15211 Pneumonia due to Methicillin susceptible Staphylococcus aureus: Secondary | ICD-10-CM | POA: Diagnosis not present

## 2023-12-04 DIAGNOSIS — M109 Gout, unspecified: Secondary | ICD-10-CM | POA: Diagnosis not present

## 2023-12-04 DIAGNOSIS — J9690 Respiratory failure, unspecified, unspecified whether with hypoxia or hypercapnia: Secondary | ICD-10-CM | POA: Diagnosis not present

## 2023-12-04 DIAGNOSIS — I2510 Atherosclerotic heart disease of native coronary artery without angina pectoris: Secondary | ICD-10-CM | POA: Diagnosis present

## 2023-12-04 DIAGNOSIS — I10 Essential (primary) hypertension: Secondary | ICD-10-CM | POA: Diagnosis not present

## 2023-12-04 DIAGNOSIS — I130 Hypertensive heart and chronic kidney disease with heart failure and stage 1 through stage 4 chronic kidney disease, or unspecified chronic kidney disease: Secondary | ICD-10-CM | POA: Diagnosis not present

## 2023-12-04 DIAGNOSIS — F32A Depression, unspecified: Secondary | ICD-10-CM | POA: Diagnosis not present

## 2023-12-04 DIAGNOSIS — I509 Heart failure, unspecified: Secondary | ICD-10-CM | POA: Diagnosis not present

## 2023-12-04 DIAGNOSIS — F319 Bipolar disorder, unspecified: Secondary | ICD-10-CM | POA: Diagnosis present

## 2023-12-04 DIAGNOSIS — Z951 Presence of aortocoronary bypass graft: Secondary | ICD-10-CM | POA: Diagnosis not present

## 2023-12-04 DIAGNOSIS — E1122 Type 2 diabetes mellitus with diabetic chronic kidney disease: Secondary | ICD-10-CM | POA: Diagnosis not present

## 2023-12-04 DIAGNOSIS — J189 Pneumonia, unspecified organism: Secondary | ICD-10-CM | POA: Diagnosis not present

## 2023-12-05 ENCOUNTER — Telehealth: Payer: Self-pay | Admitting: Cardiology

## 2023-12-05 DIAGNOSIS — J440 Chronic obstructive pulmonary disease with acute lower respiratory infection: Secondary | ICD-10-CM | POA: Insufficient documentation

## 2023-12-05 NOTE — Telephone Encounter (Signed)
 Patient is currently heavily sedated and has a breathing tube and cannot advocate for herself per patient DPR has authorized to talk with her sister which is her emergency contact. I had printed off our most recent med list and placed up front for patient to pick up.

## 2023-12-05 NOTE — Telephone Encounter (Signed)
 Pt sister called in stating [facility] is [facility] [place] and they need a list of her medications. She asked if she can have that printed out so she can come pick up. Please advise.

## 2023-12-08 ENCOUNTER — Telehealth: Payer: Self-pay | Admitting: "Endocrinology

## 2023-12-08 NOTE — Telephone Encounter (Signed)
 Let pt know that pt assistance is ready for pick up

## 2023-12-10 DIAGNOSIS — E1140 Type 2 diabetes mellitus with diabetic neuropathy, unspecified: Secondary | ICD-10-CM | POA: Diagnosis not present

## 2023-12-10 DIAGNOSIS — L97521 Non-pressure chronic ulcer of other part of left foot limited to breakdown of skin: Secondary | ICD-10-CM | POA: Diagnosis not present

## 2023-12-10 DIAGNOSIS — Z951 Presence of aortocoronary bypass graft: Secondary | ICD-10-CM | POA: Diagnosis not present

## 2023-12-10 DIAGNOSIS — E1142 Type 2 diabetes mellitus with diabetic polyneuropathy: Secondary | ICD-10-CM | POA: Diagnosis not present

## 2023-12-10 DIAGNOSIS — Z87891 Personal history of nicotine dependence: Secondary | ICD-10-CM | POA: Diagnosis not present

## 2023-12-10 DIAGNOSIS — Z9989 Dependence on other enabling machines and devices: Secondary | ICD-10-CM | POA: Diagnosis not present

## 2023-12-10 DIAGNOSIS — D509 Iron deficiency anemia, unspecified: Secondary | ICD-10-CM | POA: Diagnosis not present

## 2023-12-10 DIAGNOSIS — M1990 Unspecified osteoarthritis, unspecified site: Secondary | ICD-10-CM | POA: Diagnosis not present

## 2023-12-10 DIAGNOSIS — E11621 Type 2 diabetes mellitus with foot ulcer: Secondary | ICD-10-CM | POA: Diagnosis not present

## 2023-12-10 DIAGNOSIS — Z794 Long term (current) use of insulin: Secondary | ICD-10-CM | POA: Diagnosis not present

## 2023-12-10 DIAGNOSIS — E1121 Type 2 diabetes mellitus with diabetic nephropathy: Secondary | ICD-10-CM | POA: Diagnosis not present

## 2023-12-10 DIAGNOSIS — J9611 Chronic respiratory failure with hypoxia: Secondary | ICD-10-CM | POA: Diagnosis not present

## 2023-12-10 DIAGNOSIS — Z7984 Long term (current) use of oral hypoglycemic drugs: Secondary | ICD-10-CM | POA: Diagnosis not present

## 2023-12-10 DIAGNOSIS — M6281 Muscle weakness (generalized): Secondary | ICD-10-CM | POA: Diagnosis not present

## 2023-12-10 DIAGNOSIS — Z604 Social exclusion and rejection: Secondary | ICD-10-CM | POA: Diagnosis not present

## 2023-12-10 DIAGNOSIS — F419 Anxiety disorder, unspecified: Secondary | ICD-10-CM | POA: Diagnosis not present

## 2023-12-10 DIAGNOSIS — Z8673 Personal history of transient ischemic attack (TIA), and cerebral infarction without residual deficits: Secondary | ICD-10-CM | POA: Diagnosis not present

## 2023-12-10 DIAGNOSIS — D631 Anemia in chronic kidney disease: Secondary | ICD-10-CM | POA: Diagnosis not present

## 2023-12-10 DIAGNOSIS — E1165 Type 2 diabetes mellitus with hyperglycemia: Secondary | ICD-10-CM | POA: Diagnosis not present

## 2023-12-10 DIAGNOSIS — J189 Pneumonia, unspecified organism: Secondary | ICD-10-CM | POA: Diagnosis not present

## 2023-12-10 DIAGNOSIS — M109 Gout, unspecified: Secondary | ICD-10-CM | POA: Diagnosis not present

## 2023-12-10 DIAGNOSIS — E782 Mixed hyperlipidemia: Secondary | ICD-10-CM | POA: Diagnosis not present

## 2023-12-10 DIAGNOSIS — K760 Fatty (change of) liver, not elsewhere classified: Secondary | ICD-10-CM | POA: Diagnosis not present

## 2023-12-10 DIAGNOSIS — F319 Bipolar disorder, unspecified: Secondary | ICD-10-CM | POA: Diagnosis not present

## 2023-12-10 DIAGNOSIS — N183 Chronic kidney disease, stage 3 unspecified: Secondary | ICD-10-CM | POA: Diagnosis not present

## 2023-12-10 DIAGNOSIS — G4733 Obstructive sleep apnea (adult) (pediatric): Secondary | ICD-10-CM | POA: Diagnosis not present

## 2023-12-10 DIAGNOSIS — I2510 Atherosclerotic heart disease of native coronary artery without angina pectoris: Secondary | ICD-10-CM | POA: Diagnosis not present

## 2023-12-10 DIAGNOSIS — I959 Hypotension, unspecified: Secondary | ICD-10-CM | POA: Diagnosis not present

## 2023-12-10 DIAGNOSIS — J449 Chronic obstructive pulmonary disease, unspecified: Secondary | ICD-10-CM | POA: Diagnosis not present

## 2023-12-10 DIAGNOSIS — E1122 Type 2 diabetes mellitus with diabetic chronic kidney disease: Secondary | ICD-10-CM | POA: Diagnosis not present

## 2023-12-10 DIAGNOSIS — E66813 Obesity, class 3: Secondary | ICD-10-CM | POA: Diagnosis not present

## 2023-12-10 DIAGNOSIS — R531 Weakness: Secondary | ICD-10-CM | POA: Diagnosis not present

## 2023-12-10 DIAGNOSIS — F32A Depression, unspecified: Secondary | ICD-10-CM | POA: Diagnosis not present

## 2023-12-10 DIAGNOSIS — J9601 Acute respiratory failure with hypoxia: Secondary | ICD-10-CM | POA: Diagnosis not present

## 2023-12-10 DIAGNOSIS — N1832 Chronic kidney disease, stage 3b: Secondary | ICD-10-CM | POA: Diagnosis not present

## 2023-12-10 DIAGNOSIS — I130 Hypertensive heart and chronic kidney disease with heart failure and stage 1 through stage 4 chronic kidney disease, or unspecified chronic kidney disease: Secondary | ICD-10-CM | POA: Diagnosis not present

## 2023-12-10 DIAGNOSIS — Z9981 Dependence on supplemental oxygen: Secondary | ICD-10-CM | POA: Diagnosis not present

## 2023-12-10 DIAGNOSIS — R41841 Cognitive communication deficit: Secondary | ICD-10-CM | POA: Diagnosis not present

## 2023-12-10 DIAGNOSIS — I25810 Atherosclerosis of coronary artery bypass graft(s) without angina pectoris: Secondary | ICD-10-CM | POA: Diagnosis not present

## 2023-12-10 DIAGNOSIS — Z6841 Body Mass Index (BMI) 40.0 and over, adult: Secondary | ICD-10-CM | POA: Diagnosis not present

## 2023-12-10 DIAGNOSIS — E1143 Type 2 diabetes mellitus with diabetic autonomic (poly)neuropathy: Secondary | ICD-10-CM | POA: Diagnosis not present

## 2023-12-10 DIAGNOSIS — Z7982 Long term (current) use of aspirin: Secondary | ICD-10-CM | POA: Diagnosis not present

## 2023-12-10 DIAGNOSIS — D1724 Benign lipomatous neoplasm of skin and subcutaneous tissue of left leg: Secondary | ICD-10-CM | POA: Diagnosis not present

## 2023-12-10 DIAGNOSIS — I5032 Chronic diastolic (congestive) heart failure: Secondary | ICD-10-CM | POA: Diagnosis not present

## 2023-12-10 DIAGNOSIS — G4730 Sleep apnea, unspecified: Secondary | ICD-10-CM | POA: Diagnosis not present

## 2023-12-10 DIAGNOSIS — E038 Other specified hypothyroidism: Secondary | ICD-10-CM | POA: Diagnosis not present

## 2023-12-10 DIAGNOSIS — Z741 Need for assistance with personal care: Secondary | ICD-10-CM | POA: Diagnosis not present

## 2023-12-10 DIAGNOSIS — Z743 Need for continuous supervision: Secondary | ICD-10-CM | POA: Diagnosis not present

## 2023-12-10 DIAGNOSIS — Z862 Personal history of diseases of the blood and blood-forming organs and certain disorders involving the immune mechanism: Secondary | ICD-10-CM | POA: Diagnosis not present

## 2023-12-10 DIAGNOSIS — J22 Unspecified acute lower respiratory infection: Secondary | ICD-10-CM | POA: Diagnosis not present

## 2023-12-10 DIAGNOSIS — I509 Heart failure, unspecified: Secondary | ICD-10-CM | POA: Diagnosis not present

## 2023-12-10 DIAGNOSIS — I10 Essential (primary) hypertension: Secondary | ICD-10-CM | POA: Diagnosis not present

## 2023-12-10 DIAGNOSIS — E039 Hypothyroidism, unspecified: Secondary | ICD-10-CM | POA: Diagnosis not present

## 2023-12-10 NOTE — Assessment & Plan Note (Addendum)
 Presented from OSH 8/21 intubated. She presented to OSH with respiratory distress, found to have pneumonia, acute hypoxic respiratory failure and required intubation. She was transferred to ICU at [facility]. She was extubated on 8/22. Transferred to medicine service on 8/23.  - Patient is not on inhalers at home.  Patient has remained stable, saturating well on room air. At the time of discharge, the patient was afebrile, vital signs were stable, in no acute distress.  The patient meets all goals necessary for discharge from a medical and therapy standpoint, and thus the patient is stable and safe for discharge.  New discharge medications discussed in detail and the patient stated understanding of use and administration.  The patient is verbalizing understanding of all discharge instructions and therefore was released.    PT/OT recommended SNF, and patient is being discharged to SNF.

## 2023-12-10 NOTE — Telephone Encounter (Signed)
 error

## 2023-12-10 NOTE — H&P (Signed)
 [facility]  History & Physical Date: 12/10/23 Patient Name / DOB: Norma [name]  09-24-1950   Room: T847/T847-97               Patient Care Team: Shona Norleen Folk, MD as PCP - General  Assessment & Plan  Profound weakness secondary to acute hypoxic respiratory failure due to COPD exacerbation and community-acquired pneumonia  Secondary diagnosis  Type 2 diabetes mellitus with diabetic neuropathy and nephropathy Obstructive sleep apnea Hypertension History of CVA Hypothyroidism Coronary artery disease \\Chronic  kidney disease stage III Bipolar disorder CHF secondary to diastolic dysfunction    Principal Problem:   COPD (chronic obstructive pulmonary disease)     Active Problems:   Arthritis   Hypertension   Lower respiratory tract infection   Type 2 diabetes mellitus with diabetic neuropathy, with long-term current use of insulin        Obstructive sleep apnea   CPAP (continuous positive airway pressure) dependence   History of stroke   Hypothyroidism   Coronary artery disease   Hx of coronary artery bypass graft   Bipolar disorder, unspecified       Anxiety and depression   Chronic kidney disease, stage III (moderate) (CMS-HCC)   Chronic diastolic heart failure         Level of Care: skilled  DVT prophylaxis:  not indicated since patient is ambulatory and short stay expected Anticipated disposition: To Home with Home Health Estimated discharge:  10 to 14 days   Chief Complaint   Having no energy.  Shortness of breath.  Extreme fatigue.  Problem on ambulation.  Poor appetite.  Problem resting at night.  History Of Present Illness   Norma [name] is a  73 y.o.  female with known history of multiple medical problems including chronic COPD stage III.  Type 2 diabetes mellitus with nephropathy.  Obstructive sleep apnea.  Hypertension.  Hyperlipidemia.  Hypothyroidism.  Was admitted recently for acute  hypoxic respiratory failure due to COPD as well as pneumonia but unfortunately the despite aggressive treatment patient continued to remain weak and in need for further rehab for which reason she was admitted at our skilled nursing facility for further care.  Review Of Systems  As mentioned above  Allergies  Amoxicillin, Hydrocodone, Oxycodone -acetaminophen , Vancomycin , and Valproic acid  Home Medications   Prior to Admission medications  Medication Dose, Route, Frequency  ALPRAZolam  (XANAX ) 0.25 MG tablet TAKE ONE TABLET BY MOUTH TWICE DAILY AS NEEDED FOR ANXIETY  aspirin  (ECOTRIN) 81 MG tablet 1 tablet, Oral, Daily (standard)  buPROPion  (WELLBUTRIN  XL) 300 MG 24 hr tablet TAKE ONE TABLET BY MOUTH IN THE MORNING  cetirizine  (ZYRTEC ) 5 MG tablet 5 mg, Oral, Daily (standard)  diclofenac sodium (VOLTAREN) 1 % gel APPLY 2-4 GRAMS TO THE AFFECTED AREA AS NEEDED FOR PAIN  diphenhydrAMINE  (BENADRYL ) 25 mg capsule 25 mg, Oral  EPINEPHrine  (EPIPEN ) 0.3 mg/0.3 mL injection 0.3 mg, Intramuscular  lamoTRIgine  (LAMICTAL ) 150 MG tablet 150 mg, Oral, Nightly  losartan  (COZAAR ) 25 MG tablet TAKE ONE TABLET BY MOUTH DAILY (MORNING)  meclizine  (ANTIVERT ) 25 mg tablet 25 mg, Oral  propranolol  (INDERAL ) 10 MG tablet 10 mg, 2 times a day (standard)  REPATHA  SURECLICK 140 mg/mL PnIj INJECT ONE DOSE INTO SKIN EVERY 14 DAYS.  rOPINIRole  (REQUIP ) 3 MG tablet TAKE ONE TABLET BY MOUTH EVERY MORNING, NOON, AND AT BEDTIME. (PER PT MORNING,EVENING,BEDTIME)  semaglutide  (OZEMPIC ) 0.25 mg or 0.5 mg (2 mg/3 mL) PnIj 0.5  mg, Subcutaneous  venlafaxine  (EFFEXOR -XR) 150 MG 24 hr capsule TAKE TWO (2) CAPSULES BY MOUTH IN THE MORNING (,2 BEDTIME PER PT 02-29-20)    Medical History  Past Medical History[1]  Surgical History  Past Surgical History[2]  Social History   She is single Denies smoking denies alcohol abuse Have no children Used to work as a Theme park manager Social History[3]  Family History  Family  History[4]  Father was healthy Mother died of lymphoma Brother died secondary to complications from kidney transplant  Code Status  Full Code  Objective  Temp:  [36.6 C (97.9 F)] 36.6 C (97.9 F) Pulse:  [73] 73 Resp:  [20] 20 BP: (152)/(82) 152/82 SpO2:  [96 %] 96 % BMI (Calculated):  [48.43] 48.43  Physical Exam   General:  chronically ill appearing    Neuro:   awake, alert, oriented CV:   regular rate and rhythm Pulmonary:  Bilateral rhonchi with diminished air exchange on bases of both lungs Abdomen:  soft, nontender, nondistended Extremities:  no warmth, erythema, or edema MSK:   3/5 strength throughout HEENT:  normacephalic and atraumatic GU:   not assessed SKIN:   no petechiae  Lab Results  Labs Reviewed:  Yes Lab Results  Component Value Date   WBC 32.5 (H) 12/03/2023   HGB 14.3 12/03/2023   HCT 43.2 12/03/2023   PLT 397 12/03/2023    Lab Results  Component Value Date   NA 135 12/03/2023   K 5.0 12/03/2023   CL 98 12/03/2023   CO2 25.0 12/03/2023   BUN 21 (H) 12/03/2023   CREATININE 1.52 (H) 12/03/2023   GLU 229 (H) 12/03/2023   CALCIUM  9.1 12/03/2023   MG 2.1 12/03/2023   PHOS 4.1 11/18/2022      Pending Labs    Imaging   No results found.  Patient is a good rehab candidate She is going to do well with physical therapy and Occupational Therapy In addition to we will have to monitor her closely with medicatio   Yancey DELENA Reno, MD             [1] Past Medical History: Diagnosis Date  . Arthritis   . CHF (congestive heart failure)       . CHF (congestive heart failure)       . COPD (chronic obstructive pulmonary disease)       . Diabetes mellitus       . Disease of thyroid  gland   . Gout   . Hypertension   . Hypothyroid   . Lipoma    left ankle  . Neuropathy   . Onychodystrophy   . Stroke       [2] Past Surgical History: Procedure Laterality Date  . CARDIAC SURGERY    . CORONARY ARTERY BYPASS GRAFT     x 3  .  HYSTERECTOMY    [3] Social History Tobacco Use  . Smoking status: Former  . Smokeless tobacco: Never  Substance Use Topics  . Alcohol use: Never  . Drug use: Never  [4] No family history on file.

## 2023-12-11 DIAGNOSIS — R531 Weakness: Secondary | ICD-10-CM | POA: Insufficient documentation

## 2023-12-12 DIAGNOSIS — E1165 Type 2 diabetes mellitus with hyperglycemia: Secondary | ICD-10-CM | POA: Diagnosis not present

## 2023-12-12 DIAGNOSIS — I10 Essential (primary) hypertension: Secondary | ICD-10-CM | POA: Diagnosis not present

## 2023-12-12 DIAGNOSIS — I5032 Chronic diastolic (congestive) heart failure: Secondary | ICD-10-CM | POA: Diagnosis not present

## 2023-12-12 DIAGNOSIS — E782 Mixed hyperlipidemia: Secondary | ICD-10-CM | POA: Diagnosis not present

## 2023-12-13 NOTE — Nursing Note (Signed)
 Patient alert and oriented. Able to make needs known to staff. Set up only for assistance. Tolerated meds well. Repatha  injection given today. No adverse reactions. Mild pain. PRN tylenol  given. Currently in bed with call bell in reach

## 2023-12-15 NOTE — Nursing Note (Signed)
 WRITER SPOKE TO RESIDENT'S SISTER AND ASKED IF SHE WOULD BRING IN RESIDENT'S REPATHA  INJ. FROM HOME.

## 2023-12-15 NOTE — Nursing Note (Signed)
 RESIDENT RESTING IN BED. SHE HAS ATTENDED THERAPY WITHOUT ANY DIFFICULTIES. TYLENOL  GIVEN THIS MORNING WITH HER SCHEDULED MEDS. CBG'S MONITORED PER ORDERS. NO S/S OF DISTRESS NOTED.

## 2023-12-15 NOTE — LTC Provider Review (Signed)
 PHYSICAL THERAPY TREATMENT NOTE      Patient Name:  Norma [name]      Medical Record Number: [id number]  Date of Birth: [birth year] Location: [facility] [place]        PT Precautions: Falls, Monitor vital as needed OT Precautions: Falls,   SLP Precautions:  ,    Weight Bearing:    RLE Weight Bearing: Weight bearing as tolerated   LLE Weight Bearing: Weight bearing as tolerated    SUBJECTIVE Patient had excellent response to treatment today and had no c/o increased pain from treatment, patient demonstrated excellent cooperation and will benefit from continued skilled PT treatment.  OBJECTIVE Today's Treatment:    Transfer from: Wheelchair Transfer Type 1: To and from Transfer to 1: Stand Technique 1: Sit to stand Transfer Device 1: RW Transfer Level of Assistance 1: Minimal verbal cues, Contact guard, Standby assistance Trials/Comments 1: Guided patient through transfer training with CG-SB(A) and verbal cues for hand placement and sequencing as well as safety awareness with locking brakes and proper pre-stand positioning to improve functional mobility and decrease fall risk with transfers.       Surface 1: Level tile Device 1: Rollator Other Apparatus 1: Wheelchair follow Assistance 1: Contact guard, Minimal verbal cues Quality of Gait 1: forward flexed, slow gait , unsteady with Rollator Comments/Distance (ft) 1: Gait training on level surface in hall with 4WRW x 30' x 2 reps with CG(A) and cues for posture, stride length equality and safety when turning and sitting to improve functional mobility and decrease fall risk. Patient had 2 occasions where she quickly needed to sit without warning showing need for continued skilled PT intervention.       Balance Training: Standing balance activity at table top with unilateral and bilateral UE's reaching outside BOS for up to 3 minutes at a time to challenge righting reactions with occasional  slight LOB noted when reaching accross midline with static balance training.       Nu Step Comments: Ther-Ex seated to BLEs and BUE's on Nu-step machine with level 8 resistance x 2 sets of 10 minutes with occasional verbal cues required for attendance to task as well as speed to maximize results of exercises and increase strength and flexibility needed for transfers and functional mobility.     I attest that I have reviewed the above information. SignedBETHA Rankin Lime, PTA 12/15/2023

## 2023-12-16 NOTE — Nursing Note (Signed)
 Resting quietly with eyes closed, resp even and non labored. HOB elevated and call bell in reach.

## 2023-12-17 DIAGNOSIS — L97421 Non-pressure chronic ulcer of left heel and midfoot limited to breakdown of skin: Secondary | ICD-10-CM | POA: Diagnosis not present

## 2023-12-17 DIAGNOSIS — E11621 Type 2 diabetes mellitus with foot ulcer: Secondary | ICD-10-CM | POA: Diagnosis not present

## 2023-12-17 NOTE — Progress Notes (Signed)
 Norma [name] 73 y.o. [birth year] Phone: [phone] (home)  Address: [address] [place] [address]  MRN: [id number] Primary MD : Norma [name] [facility]   Problem List Items Addressed This Visit     Diabetic ulcer of left midfoot associated with type 2 diabetes mellitus, limited to breakdown of skin    - Primary   Diabetic foot ulcer  She had apligraf applications 01/29/23,02/12/23, 07/02/23 and 07/09/23.  PuraPly graft applied 08/13/23, 08/20/23, 09/10/23, 10/08/23, 10/22/23, 10/29/23, 11/05/23, 11/12/23 and today 11/19/23.  The wound is very small and superficial.  Continue aquacel ag, kerlix and ACE. Change MWF.  Preoperative diagnosis: Periwound callus, biofilm of ulcer   Postoperative diagnosis: Same  Procedure performed: Paring of peri wound callus on the left great toe plantar aspect and excisional debridement of biofilm of the ulcer  Anesthesia: None  Surgeon: Duwaine Jumper, MD  Procedure performed in detail:  After informed consent was obtained, a 15 blade was used to pare back the peri wound callus of the left great toe on the plantar aspect.  It was used to excise the biofilm.  Patient tolerated procedure well.  I personally spent over 20 minutes face-to-face and non-face-to-face in the care of this patient, which includes all pre, intra, and post visit time on the date of service.  All documented time was specific to the E/M visit and does not include any procedures that may have been performed.   Wound Care Visit  Past Medical History:  Diagnosis Date  . Arthritis   . CHF (congestive heart failure)    (CMS-HCC)   . CHF (congestive heart failure)    (CMS-HCC)   . COPD (chronic obstructive pulmonary disease)    (CMS-HCC)   . Diabetes mellitus    (CMS-HCC)   . Disease of thyroid  gland   . Gout   . Hypertension   . Hypothyroid   . Lipoma    left ankle  . Neuropathy   . Onychodystrophy   . Stroke    (CMS-HCC)    Past Surgical  History:  Procedure Laterality Date  . CARDIAC SURGERY    . CORONARY ARTERY BYPASS GRAFT     x 3  . HYSTERECTOMY     Allergies  Allergen Reactions  . Amoxicillin Anaphylaxis and Other (See Comments)    Has patient had a PCN reaction causing immediate rash, facial/tongue/throat swelling, SOB or lightheadedness with hypotension: Yes Has patient had a PCN reaction causing severe rash involving mucus membranes or skin necrosis: Yes Has patient had a PCN reaction that required hospitalization: Yes Has patient had a PCN reaction occurring within the last 10 years: Yes If all of the above answers are NO, then may proceed with Cephalosporin use.  SABRA Hydrocodone Anaphylaxis  . Oxycodone -Acetaminophen  Other (See Comments)    Causes chest pain /tightness. Pressure pain around her ribs.  . Vancomycin  Itching  . Valproic Acid Other (See Comments)    Causes falls     Meds:  Current Outpatient Medications:  .  ALPRAZolam  (XANAX ) 0.25 MG tablet, TAKE ONE TABLET BY MOUTH TWICE DAILY AS NEEDED FOR ANXIETY, Disp: , Rfl:  .  aspirin  (ECOTRIN) 81 MG tablet, Take 1 tablet by mouth daily., Disp: , Rfl:  .  buPROPion  (WELLBUTRIN  XL) 300 MG 24 hr tablet, TAKE ONE TABLET BY MOUTH IN THE MORNING, Disp: , Rfl:  .  cetirizine  (ZYRTEC ) 5 MG tablet, Take 1 tablet (5 mg total) by mouth daily., Disp: ,  Rfl:  .  diclofenac sodium (VOLTAREN) 1 % gel, APPLY 2-4 GRAMS TO THE AFFECTED AREA AS NEEDED FOR PAIN, Disp: , Rfl:  .  diphenhydrAMINE  (BENADRYL ) 25 mg capsule, Take 25 mg by mouth., Disp: , Rfl:  .  EPINEPHrine  (EPIPEN ) 0.3 mg/0.3 mL injection, Inject 0.3 mg into the muscle., Disp: , Rfl:  .  lamoTRIgine  (LAMICTAL ) 150 MG tablet, Take 150 mg by mouth nightly., Disp: , Rfl:  .  losartan  (COZAAR ) 25 MG tablet, TAKE ONE TABLET BY MOUTH DAILY (MORNING), Disp: , Rfl:  .  meclizine  (ANTIVERT ) 25 mg tablet, Take 25 mg by mouth., Disp: , Rfl:  .  propranolol  (INDERAL ) 10 MG tablet, Take 1 tablet (10 mg total) by mouth  two (2) times a day., Disp: , Rfl:  .  REPATHA  SURECLICK 140 mg/mL PnIj, INJECT ONE DOSE INTO SKIN EVERY 14 DAYS., Disp: , Rfl:  .  rOPINIRole  (REQUIP ) 3 MG tablet, TAKE ONE TABLET BY MOUTH EVERY MORNING, NOON, AND AT BEDTIME. (PER PT MORNING,EVENING,BEDTIME), Disp: , Rfl:  .  semaglutide  (OZEMPIC ) 0.25 mg or 0.5 mg (2 mg/3 mL) PnIj, Inject 0.5 mg under the skin., Disp: , Rfl:  .  venlafaxine  (EFFEXOR -XR) 150 MG 24 hr capsule, TAKE TWO (2) CAPSULES BY MOUTH IN THE MORNING (,2 BEDTIME PER PT 02-29-20), Disp: , Rfl:  No current facility-administered medications for this visit.  Facility-Administered Medications Ordered in Other Visits:  .  [MAR Hold - Suspended Admission] acetaminophen  (TYLENOL ) tablet 1,000 mg, 1,000 mg, Oral, Q6H PRN, Hasanaj, Xaje Adem, MD, 1,000 mg at 12/16/23 0935 .  [MAR Hold - Suspended Admission] aripiprazole  (ABILIFY ) tablet 5 mg, 5 mg, Oral, Daily, Hasanaj, Xaje Adem, MD, 5 mg at 12/17/23 0801 .  [MAR Hold - Suspended Admission] aspirin  chewable tablet 81 mg, 81 mg, Oral, Daily, Hasanaj, Xaje Adem, MD, 81 mg at 12/17/23 0801 .  [MAR Hold - Suspended Admission] azelastine (ASTELIN) 137 mcg (0.1 %) nasal spray 1 spray, 1 spray, Each Nare, BID, Hasanaj, Xaje Adem, MD, 1 spray at 12/17/23 0801 .  [MAR Hold - Suspended Admission] buPROPion  (Wellbutrin  XL) 24 hr tablet 150 mg, 150 mg, Oral, Daily, Hasanaj, Xaje Adem, MD, 150 mg at 12/17/23 0801 .  [MAR Hold - Suspended Admission] cetirizine  (ZYRTEC ) tablet 10 mg, 10 mg, Oral, Nightly, Hasanaj, Xaje Adem, MD, 10 mg at 12/16/23 2053 .  [MAR Hold - Suspended Admission] cholecalciferol  (vitamin D3-50 mcg (2,000 unit)) tablet 50 mcg, 50 mcg, Oral, Daily, Hasanaj, Xaje Adem, MD, 50 mcg at 12/17/23 0801 .  [MAR Hold - Suspended Admission] empagliflozin  (JARDIANCE ) tablet 10 mg, 10 mg, Oral, Daily, Hasanaj, Xaje Adem, MD, 10 mg at 12/17/23 0801 .  [MAR Hold - Suspended Admission] EPINEPHrine  (EPIPEN ) injection 0.3 mg, 0.3 mg,  Intramuscular, Once PRN, Hasanaj, Xaje Adem, MD .  ILDA Hold - Suspended Admission] evolocumab  PnIj 140 mg, 140 mg, Subcutaneous, Q14 Days, Hasanaj, Yancey Skeens, MD, 140 mg at 12/13/23 0810 .  [MAR Hold - Suspended Admission] gabapentin  (NEURONTIN ) capsule 400 mg, 400 mg, Oral, Daily, Hasanaj, Xaje Adem, MD, 400 mg at 12/17/23 0801 .  [MAR Hold - Suspended Admission] gabapentin  (NEURONTIN ) capsule 800 mg, 800 mg, Oral, Nightly (2000), Hasanaj, Yancey Skeens, MD, 800 mg at 12/16/23 1926 .  [MAR Hold - Suspended Admission] insulin  glargine U-300 conc (TOUJEO  SOLOSTAR) injection pen (U-300) 5.1 Units, 5.1 Units, Subcutaneous, Nightly, Hasanaj, Xaje Adem, MD, 5.1 Units at 12/16/23 2053 .  [MAR Hold - Suspended Admission] lamoTRIgine  (LaMICtal ) tablet 150 mg, 150  mg, Oral, Nightly (2000), Hasanaj, Yancey Skeens, MD, 150 mg at 12/16/23 1926 .  [MAR Hold - Suspended Admission] levothyroxine  (SYNTHROID ) tablet 112 mcg, 112 mcg, Oral, daily, Hasanaj, Xaje Adem, MD, 112 mcg at 12/17/23 0514 .  [MAR Hold - Suspended Admission] omeprazole  (PriLOSEC) capsule 40 mg, 40 mg, Oral, Nightly (2000), Hasanaj, Yancey Skeens, MD, 40 mg at 12/16/23 1926 .  [MAR Hold - Suspended Admission] ondansetron  (ZOFRAN ) tablet 4 mg, 4 mg, Oral, Q8H PRN, Hasanaj, Xaje Adem, MD .  ILDA Hold - Suspended Admission] polyethylene glycol (GLYCOLAX ) powder (BULK CONTAINER) 17 g, 17 g, Oral, Daily, Hasanaj, Xaje Adem, MD, 17 g at 12/17/23 0801 .  [MAR Hold - Suspended Admission] propranolol  (INDERAL ) tablet 40 mg, 40 mg, Oral, BID, Hasanaj, Xaje Adem, MD, 40 mg at 12/17/23 0801 .  [MAR Hold - Suspended Admission] rOPINIRole  (REQUIP ) tablet 3 mg, 3 mg, Oral, Daily, Hasanaj, Xaje Adem, MD, 3 mg at 12/17/23 0801 .  [MAR Hold - Suspended Admission] rOPINIRole  (REQUIP ) tablet 6 mg, 6 mg, Oral, Nightly (2000), Hasanaj, Yancey Skeens, MD, 6 mg at 12/16/23 1926 .  [MAR Hold - Suspended Admission] semaglutide  (OZEMPIC ) injection PnIj 1 mg, 1 mg, Subcutaneous, Q7 Days,  Hasanaj, Xaje Adem, MD, 1 mg at 12/12/23 0815 .  [MAR Hold - Suspended Admission] tizanidine  (ZANAFLEX ) tablet 2 mg, 2 mg, Oral, BID, Hasanaj, Xaje Adem, MD, 2 mg at 12/17/23 0801 .  [MAR Hold - Suspended Admission] triamcinolone  (KENALOG) 0.1 % cream, , Topical, BID, Hasanaj, Xaje Adem, MD, Given at 12/17/23 0801 .  [MAR Hold - Suspended Admission] tuberculin PPD injection, 5 Units, Intradermal, Once, Hasanaj, Xaje Adem, MD .  ILDA Hold - Suspended Admission] valsartan (DIOVAN) tablet 40 mg, 40 mg, Oral, Daily, Hasanaj, Xaje Adem, MD, 40 mg at 12/17/23 0801 .  [MAR Hold - Suspended Admission] venlafaxine  (EFFEXOR -XR) 24 hr capsule 300 mg, 300 mg, Oral, Nightly (2000), Hasanaj, Yancey Skeens, MD, 300 mg at 12/16/23 1926  SocHx:  reports that she has quit smoking. She has never used smokeless tobacco. She reports that she does not drink alcohol and does not use drugs.  FamHx: has no family status information on file.    Review of Systems A 12 system review of systems was negative except as noted in HPI  special needs met: patient walks with walker  Subjective:  12/17/23  She returns for follow up.  She had respiratory failure and was intubated.  She was transferred to [facility].  She was admitted 8/21-8/27/25.  She reports she is doing well.  11/26/23  She returns for follow up.  She scratched the dorsum of her right foot and there is a wound.  11/19/23  She presents for her 9th PuraPly application.  11/12/23  She presents for her 8th PuraPly application.  11/05/23  She presents for her 7th PuraPly application.  10/29/23  She presents for her sixth PuraPly application.  10/22/23  She presents for a PuraPly application.  She saw Endocrinology Dr. Lenis on 10/09/23. I reviewed this note.  10/08/23  She couldn't come last week because she had another appointment.  She reports she has been walking on it without the dressing.  She is here for PuraPly application.  09/10/23  She  presents for follow up.  She did not have transport so could not come last week.  08/27/23  She presents for follow up.  She has a new dorsal wound and some wounds around the callus  08/20/23  She presents for her  second PuraPly application.  She presented today with her dressing on and her post op shoe.  08/13/23  She presents for PuraPly application  08/06/23  She reports that a callus came off on its own. She reports not wearing her post op shoe.  She is wearing a sandal today.  07/16/23  She returns for follow up.  She saw podiatry yesterday.  She reports she had some calluses pared back on the right foot.  She forgot to ask about diabetic shoes.  07/09/23  She presents for the second round of apligraf application.  07/02/23  She presents for apligraf application.  No complaints.  06/25/23  She reports having a blister on the 1st metatarsal head that has just resolved.  05/21/23  She returns for follow up.  04/30/23  She returns for follow up.  She saw Dr. Roddie last week and he removed some of her callus.  04/02/23  She returns for follow up.  She reports she has been using the cast sock.  03/19/23  She returns for follow up.  She reports taking a shower this morning.  02/19/23   One week follow up from second apligraf placement. She reports the graft came off on 02/16/23.  02/12/23  Presents for apligraf placement.  She has no complaints.  02/05/23  Apligraf came off on 10/21.  She has no other complaints.  01/29/23  She presents for apligraf placement  01/22/23  She returns for follow up. She has no complaints. She reports not much change in her foot.  12/11/22  She returns for follow up.  She reports her burns have healed.  She is doing the hydrafera blue for her foot.  11/27/22  She returns for follow up and says her burn is doing great.  She is having trouble with the hydrafera blue dressing.  11/20/22  She returns for follow up.  She noticed a  scald burn on her left breast as well.  She reports she is doing much better.  11/13/22  Norma [name] presents to the wound care clinic for evaluation of a left great toe plantar diabetic foot ulcer.  She also was moving boiled water  and the bottom of the cup broke and she had a scald burn this past weekend 11/09/22.  She reports it mainly burned her left upper thigh and less so on the right thigh.  She has had the left foot ulcer for about 3 months.  She is wearing sketchers.  Objective:    Vitals:   12/17/23 0941  BP: 142/63  Pulse: 66  Resp: 18  Temp: 36.2 C (97.1 F)  SpO2: 95%     General:  well appearing  Pulmonary: CTAB, no wheezes, rhonci, crackles  CV:   RRR, S1,S2, no murmurs, gallops,rubs  GI: soft, bowel sounds active, non-tender  Neuro:    AAO x 3  Psych : Normal mood and judgement  Pulse :   +2 DP on the left foot  Wound:  0.2 x 0.2 x 0.1 cm

## 2023-12-17 NOTE — Nursing Note (Signed)
 Patient alert and oriented. Able to make needs known to staff. Set up only for assistance. Tolerated meds well. Mild pain. PRN tylenol  given. Participated in therapy. Currently in bed with call bell in reach.

## 2023-12-18 NOTE — Nursing Note (Signed)
 Alert and oriented. Able to make needs known. Meds given and tolerated. No complaints of pain or distress. Resting in bed at this time with call light in reach. Continues therapy.

## 2024-01-01 ENCOUNTER — Telehealth: Payer: Self-pay | Admitting: "Endocrinology

## 2024-01-01 NOTE — Telephone Encounter (Signed)
 PAP was picked up by sister Nathanel Croft, also addressed in note on 01/01/24

## 2024-01-01 NOTE — Telephone Encounter (Signed)
 Pt picked up pt assistance.

## 2024-01-01 NOTE — Telephone Encounter (Signed)
 It was picked up by Darin Nathanel Croft

## 2024-01-02 DIAGNOSIS — E1143 Type 2 diabetes mellitus with diabetic autonomic (poly)neuropathy: Secondary | ICD-10-CM | POA: Diagnosis not present

## 2024-01-02 DIAGNOSIS — J449 Chronic obstructive pulmonary disease, unspecified: Secondary | ICD-10-CM | POA: Diagnosis not present

## 2024-01-02 DIAGNOSIS — R531 Weakness: Secondary | ICD-10-CM | POA: Diagnosis not present

## 2024-01-02 DIAGNOSIS — G4730 Sleep apnea, unspecified: Secondary | ICD-10-CM | POA: Diagnosis not present

## 2024-01-02 DIAGNOSIS — J9611 Chronic respiratory failure with hypoxia: Secondary | ICD-10-CM | POA: Diagnosis not present

## 2024-01-02 DIAGNOSIS — Z8673 Personal history of transient ischemic attack (TIA), and cerebral infarction without residual deficits: Secondary | ICD-10-CM | POA: Diagnosis not present

## 2024-01-02 DIAGNOSIS — E038 Other specified hypothyroidism: Secondary | ICD-10-CM | POA: Diagnosis not present

## 2024-01-02 DIAGNOSIS — I10 Essential (primary) hypertension: Secondary | ICD-10-CM | POA: Diagnosis not present

## 2024-01-02 NOTE — LTC Provider Review (Signed)
 OCCUPATIONAL THERAPY DISCHARGE SUMMARY    Patient Name:  Norma [name]      Medical Record Number: [id number]  Date of Birth: [birth year] Location: [facility] [place] Physician:Hasanaj, [place]  PT Precautions: Falls, Monitor vital as needed OT Precautions: Falls,   SLP Precautions:  ,    Weight Bearing:         RLE Weight Bearing: Weight bearing as tolerated   LLE Weight Bearing: Weight bearing as tolerated    SUBJECTIVE Non bill dc on this date   OBJECTIVE Today's Treatment   Dominant hand is Right, Patient Able to Express Needs/Desires: Recent Change Hearing - Right Ear: No Deficit,    Basic ADLs: Patient Able to Express Needs/Desires: Recent Change Location: Shower Self-Feeding (Utensils): Set up Self-Feeding (Cups): Set up Grooming: Modified Independence (standing at sink) Oral Care: Set up UB Bathing: Set up LB Bathing: Supervision (when standing during pericare hygiene using grab bars for balance and support with sleeve protector on LLE to prevent dressing from getting wet.) UB Dressing: Independent LB Dressing: Set up (doffing/donning underwear and pants) Footwear management: Set up (setup seated at EOB/ Min A seated in WC when donning sock on left foot that is dressed) Toilet Hygiene: Modified Independence Toilet: Clothing Management: Modified Independence Is the patient continent?: yes Assistive Devices : Wheelchair, Rolling walker  Functional Standing:  Time/Activity: 1 min 25 seconds  max standing endurance due to decrepid sounds of knees.  Assistive Devices : Cane, Paediatric nurse, Environmental consultant, Other (Comment) (Pt reported having manual wc but reported it being past down from family and she does not use)  Transfers:  Toilet: Toilet Transfer Technique: Medical sales representative device used: Sports coach to / from Toilet: Civil Service fast streamer assistance: Modified independence  Toilet  Transfers Comments: From Bed>bathroom (toilet then sink) then back to [facility] pt completed functional mobility with Rollator at [employer]. Independence    Shower: Transfer In/Out- Shower: Teacher, early years/pre to: Shower seat with back  Musician Device: Geophysicist/field seismologist Transfers assistance: Haematologist Comments: Functional transfer in and out of walk in shower using grab bars required SBA   Additional Transfer: Transfer 1 Transfer from: Wheelchair Transfer Type : To Transfer to : Bed Technique : Ambulatory Transfer Device : rollator Transfer Level of Assistance : Modified independence Trials/Comments : Functional mobility using Rollator Mod. Independence ~70 feet    Transfer 2 Transfer from: Sit Transfer Type : To and from Transfer to : Stand Technique : Sit to stand, Stand to sit Transfer Device : rollator Transfer Level of Assistance : Modified independence Trials/Comments : STS 30 second test completing x8   IADLs: Meal Prep Level: Other (Comment) Meal Prep Level of Assistance:  (Pt reports family friend completing IADL task)  Medication Management Level of Assistance: Setup Medication Management comments: Pt with medication packets  ASSESSMENT Short Term Goals: Short Term Goal #1: Pt will decrease risk for falls AEB an increase in score to 6/11 on 30 sec STS test.  Date Established : 12/12/23 Time Frame : 2 weeks  Plan : Not Met   Short Term Goal #2: Pt will increase ability to stand supported for > 5 Min in order to increase participation in ADL task  Date Established : 12/12/23 Time Frame : 2 weeks  Plan : Not Met, Goal Discontinued   Short Term Goal #3: Pt will improve BUE strength  4/5 in order to improve overall strength, endurance and act tolerance.  Date Established : 12/12/23 Time Frame : 2 weeks  Plan : Not Met, Goal Discontinued   Short Term Goal #4: Pt will complete LBD with Sup and use of AD as needed in order  to return to PLOF in improve overall QOL.  Date Established : 12/12/23 Time Frame : 2 weeks  Plan : Met   Short Term Goal #5: Pt will complete toilet transfer, hygiene and clothing management with CGA in order to return to PLOF.  Date Established : 12/12/23 Time Frame : 2 weeks  Plan : Met   Long Term Goals: LTC Long Term Goal #1: Pt will decrease risk for falls AEB an increase in score to 9/11 on 30 sec STS test.  Date Established : 12/12/23 Time Frame : 30 Days  Plan : Not Met   LTC Long Term Goal #2: Pt will demonstrate increased activity tolerance and dynamic standing balance AEB participation in functional task in standing 20+ min with Good standing balance.    Time Frame : 30 Days  Plan : Not Met, Goal Discontinued   Long Term Goal #3: Pt will improve BUE strength 5/5 in order to improve overall strength, endurance and act tolerance.  Date Established : 12/12/23 Time Frame : 30 Days  Plan : Not Met, Goal Discontinued   Long Term Goal #4: Pt will completed all ADL task with Mod I in order to return to PLOF.  Date Established : 12/12/23 Time Frame : 30 Days  Plan : Met      PLAN OT Plan: No skilled OT No Skilled OT: Reached full rehab potential at this time Equipment Recommendations: Tub transfer bench, Reacher, Rolling Walker      Discharge Recommendations: AM/PM assistance   I attest that I have reviewed the above information. Signed: Kailan A Parker, OT Filed 01/02/2024

## 2024-01-02 NOTE — LTC Provider Review (Signed)
-------------------------------------------------------------------------------   Summary: Discharge summary -------------------------------------------------------------------------------      PHYSICAL THERAPY DISCHARGE SUMMARY       Patient Name:  Norma [name]      Medical Record Number: [id number]  Date of Birth: [birth year]  Sex: Female       Location: [facility] Physician: Orpha Haley   PT Precautions: Falls, Monitor vital as needed OT Precautions: Falls,     Weight Bearing: RLE Weight Bearing: Weight bearing as tolerated   LLE Weight Bearing: Weight bearing as tolerated    OBJECTIVE Objective measurements are from previous session:   Bed Mobility:  Bed Mobility From 1: Supine Bed Mobility Type 1: To Bed Mobility to 1: Short sit Level of Assistance 1: Supervision, Contact guard Bed Mobility Comments 1: Patient requires intermittent support with verbal cues, touching A - CGA     Transfers:   Transfer from: Wheelchair Transfer Type 1: To and from Transfer to 1: Chair with arms Technique 1: Stand pivot, Ambulatory Transfer Device 1: RW Transfer Level of Assistance 1: Minimal verbal cues, Supervision, Modified independence Trials/Comments 1: Pt required education and minimal verbal cuing to improve sequencing of hands, balance adjustments, and sequencing to increase steadiness and safety with trials of SPTs unsupported and with AD. Seated rest periods required due to fatigue.   Gait:  FWB   Surface 1: Level tile Device 1: Rollator Other Apparatus 1: Wheelchair follow Assistance 1: Minimal verbal cues, Close supervision Quality of Gait 1: forward flexed, slow gait , unsteady with Rollator Comments/Distance (ft) 1: Directed patient through gait training on level surface in hall with 4WRW x 200' x 2 reps with close supervision and cues for posture, stride length equality and safety when turning and sitting to improve  functional mobility and decrease fall risk and would benefit from continued skilled PT gait training.    Stairs Mobility:   Rails 1: Bilateral Assistance 1: Contact guard, Minimal verbal cues, Minimum assistance Comment/# Steps 1: Ascending and descending stairs with Bilateral rails requried frequent repeated verbal cues and Min-CG(A) showing need for continued skilled PT interventions.    ASSESSMENT Short Term Goals: SHORT GOAL #1: Pt will improve gross strength of BLE to 4+/5 to improve transfers.  Date Established : 12/12/23 Time Frame : 2 weeks  Plan : Met   Short Term Goal #2: Pt will improve balance and decrease risk of falls as evidenced by Lars Balance Test score of 40/56.  Date Established : 12/12/23 Time Frame : 2 weeks  Plan : Not Met   Long Term Goals: Long Term Goal #1: Pt will improve safety with transfers to Mod I to allow for safe dc home.  Date Established : 12/12/23 Time Frame : 4 weeks  Plan : Met   Long Term Goal #2: Pt will amb safely with rollator to allow for safe dc home with mod I.  Date Established : 12/12/23 Time Frame : 4 weeks  Plan : Met   Analysis of Functional Outcome/Clinical Impression: Good ability to return to PLOF with support   PLAN PT Plan: Skilled PT    PT Discharge Recommendations: Home PT, Home with Supervision, Frequent checks, To Be Determined Equipment Recommended: Rollator    I attest that I have reviewed the above information. Signed: Rankin Lawrence, PT Filed 01/02/2024

## 2024-01-02 NOTE — Nursing Note (Signed)
 Patient is alert and oriented. Able to make needs known to staff. Denies any pain at this time. Tolerated meds well. She will be discharged this afternoon. Family will be here to pick patient up around 130 pm.

## 2024-01-02 NOTE — Discharge Summary (Signed)
 [facility] Discharge Summary        Admit date:    12/10/2023 Discharge date:   01/02/2024 Length of stay:    LOS: 23 days     Discharge Service:   [facility] Discharge Attending Physician: Yancey Reno, MD Discharge to:    To Home with Home Health Condition at Discharge:  good Code Status:    Full Code  Patient Care Team: Shona Norleen Folk, MD as PCP - General  Summary  Consults       none Endocrinology  Discharge Diagnoses  Principal Problem:   COPD (chronic obstructive pulmonary disease)    (CMS-HCC) Active Problems:   Arthritis   Hypertension   Diabetic ulcer of toe of left foot associated with type 2 diabetes mellitus, limited to breakdown of skin    (CMS-HCC)   Lower respiratory tract infection   Type 2 diabetes mellitus with diabetic neuropathy, with long-term current use of insulin     (CMS-HCC)   Obstructive sleep apnea   CPAP (continuous positive airway pressure) dependence   History of stroke   Hypothyroidism   Coronary artery disease   Hx of coronary artery bypass graft   Bipolar disorder, unspecified    (CMS-HCC)   Anxiety and depression   Chronic kidney disease, stage III (moderate) (CMS-HCC)   Chronic diastolic heart failure    (CMS-HCC)   Weakness generalized   Type 2 diabetes mellitus with diabetic nephropathy    (CMS-HCC)   Need for assistance with personal care   Muscle weakness (generalized)   Cognitive communication deficit   Flu vaccine need   [facility]   At [facility] rehab in [place]  patient was treated with combination of intensive physical therapy and Occupational Therapy in addition to close supervision of medication on daily basis.  Patient has excellent response to treatment with ability to walk.  Transfer.  And doing steps.  Also patient was arranged to have additional physical therapy and Occupational Therapy as an outpatient in addition patient was advised to follow-up with her primary care doctor within 2 weeks  I  spent 30 mins in the discharge of this patient.  Procedures   none  Discharge Medications     Your Medication List     PAUSE taking these medications    aspirin  81 MG tablet Wait to take this until: January 03, 2024 Commonly known as: ECOTRIN Take 1 tablet by mouth daily.   buPROPion  300 MG 24 hr tablet Wait to take this until: January 03, 2024 Commonly known as: Wellbutrin  XL TAKE ONE TABLET BY MOUTH IN THE MORNING   cetirizine  5 MG tablet Wait to take this until: January 03, 2024 Commonly known as: ZYRTEC  Take 1 tablet (5 mg total) by mouth daily.   diclofenac sodium 1 % gel Wait to take this until: January 03, 2024 Commonly known as: VOLTAREN APPLY 2-4 GRAMS TO THE AFFECTED AREA AS NEEDED FOR PAIN   lamoTRIgine  150 MG tablet Wait to take this until: January 03, 2024 Commonly known as: LaMICtal  Take 150 mg by mouth nightly.   losartan  25 MG tablet Wait to take this until: January 03, 2024 Commonly known as: COZAAR  TAKE ONE TABLET BY MOUTH DAILY (MORNING)   OZEMPIC  0.25 mg or 0.5 mg (2 mg/3 mL) Pnij Wait to take this until: January 03, 2024 Generic drug: semaglutide  Inject 0.5 mg under the skin.   propranolol  10 MG tablet Wait to take this until: January 03, 2024 Commonly known as: INDERAL   Take 1 tablet (10 mg total) by mouth two (2) times a day.   REPATHA  SURECLICK 140 mg/mL Pnij Wait to take this until: January 03, 2024 Generic drug: evolocumab  INJECT ONE DOSE INTO SKIN EVERY 14 DAYS.   rOPINIRole  3 MG tablet Wait to take this until: January 03, 2024 Commonly known as: REQUIP  TAKE ONE TABLET BY MOUTH EVERY MORNING, NOON, AND AT BEDTIME. (PER PT MORNING,EVENING,BEDTIME)   venlafaxine  150 MG 24 hr capsule Wait to take this until: January 03, 2024 Commonly known as: EFFEXOR -XR TAKE TWO (2) CAPSULES BY MOUTH IN THE MORNING (,2 BEDTIME PER PT 02-29-20)       STOP taking these medications    ALPRAZolam  0.25 MG  tablet Commonly known as: XANAX    diphenhydrAMINE  25 mg capsule Commonly known as: BENADRYL    EPINEPHrine  0.3 mg/0.3 mL injection Commonly known as: EPIPEN    meclizine  25 mg tablet Commonly known as: ANTIVERT        START taking these medications    acetaminophen  500 MG tablet Commonly known as: TYLENOL  Take 2 tablets (1,000 mg total) by mouth every six (6) hours as needed.   aripiprazole  5 MG tablet Commonly known as: ABILIFY  Take 1 tablet (5 mg total) by mouth in the morning.   azelastine 137 mcg (0.1 %) nasal spray Commonly known as: ASTELIN 1 spray into each nostril two (2) times a day. Use in each nostril as directed   cholecalciferol  (vitamin D3-50 mcg (2,000 unit)) 50 mcg (2,000 unit) tablet Take 1 tablet (50 mcg total) by mouth in the morning.   gabapentin  400 MG capsule Commonly known as: NEURONTIN  Take 1 capsule (400 mg total) by mouth in the morning.   gabapentin  400 MG capsule Commonly known as: NEURONTIN  Take 2 capsules (800 mg total) by mouth nightly.   insulin  aspart 100 unit/mL (3 mL) injection pen Commonly known as: NovoLOG  FLEXPEN Inject 0.04 mL (4 Units total) under the skin Three (3) times a day with a meal.   insulin  glargine U-300 conc 300 unit/mL (1.5 mL) injection pen Commonly known as: TOUJEO  SOLOSTAR Inject 5 Units under the skin nightly.   levothyroxine  112 MCG tablet Commonly known as: SYNTHROID  Take 1 tablet (112 mcg total) by mouth daily. Start taking on: January 03, 2024   omeprazole  40 MG capsule Commonly known as: PriLOSEC Take 1 capsule (40 mg total) by mouth nightly.        Pending Test Results   none   Lab Results   Results for orders placed or performed during the hospital encounter of 12/10/23  Basic Metabolic Panel  Result Value Ref Range   Sodium 137 135 - 145 mmol/L   Potassium 4.3 3.5 - 5.0 mmol/L   Chloride 102 98 - 107 mmol/L   CO2 26.9 21.0 - 32.0 mmol/L   Anion Gap 8 3 - 11 mmol/L   BUN 22 (H) 8  - 20 mg/dL   Creatinine 8.37 (H) 9.39 - 1.10 mg/dL   BUN/Creatinine Ratio 14    eGFR CKD-EPI (2021) Female 34 (L) >=60 mL/min/1.39m2   Glucose 128 70 - 179 mg/dL   Calcium  9.1 8.5 - 10.1 mg/dL  Read PPD  Result Value Ref Range   TB Skin Test Negative   Read PPD  Result Value Ref Range   TB Skin Test Negative   POCT Glucose  Result Value Ref Range   Glucose, POC 139 (H) 70 - 105 mg/dL  POCT Glucose  Result Value Ref Range   Glucose, POC 202 (  H) 70 - 105 mg/dL  POCT Glucose  Result Value Ref Range   Glucose, POC 135 (H) 70 - 105 mg/dL  POCT Glucose  Result Value Ref Range   Glucose, POC 148 (H) 70 - 105 mg/dL  POCT Glucose  Result Value Ref Range   Glucose, POC 152 (H) 70 - 105 mg/dL  POCT Glucose  Result Value Ref Range   Glucose, POC 181 (H) 70 - 105 mg/dL  POCT Glucose  Result Value Ref Range   Glucose, POC 152 (H) 70 - 105 mg/dL  POCT Glucose  Result Value Ref Range   Glucose, POC 157 (H) 70 - 105 mg/dL  POCT Glucose  Result Value Ref Range   Glucose, POC 140 (H) 70 - 105 mg/dL  POCT Glucose  Result Value Ref Range   Glucose, POC 179 (H) 70 - 105 mg/dL  POCT Glucose  Result Value Ref Range   Glucose, POC 224 (H) 70 - 105 mg/dL  POCT Glucose  Result Value Ref Range   Glucose, POC 160 (H) 70 - 105 mg/dL  POCT Glucose  Result Value Ref Range   Glucose, POC 151 (H) 70 - 105 mg/dL  POCT Glucose  Result Value Ref Range   Glucose, POC 163 (H) 70 - 105 mg/dL  POCT Glucose  Result Value Ref Range   Glucose, POC 186 (H) 70 - 105 mg/dL  POCT Glucose  Result Value Ref Range   Glucose, POC 193 (H) 70 - 105 mg/dL  POCT Glucose  Result Value Ref Range   Glucose, POC 171 (H) 70 - 105 mg/dL  POCT Glucose  Result Value Ref Range   Glucose, POC 144 (H) 70 - 105 mg/dL  POCT Glucose  Result Value Ref Range   Glucose, POC 176 (H) 70 - 105 mg/dL  POCT Glucose  Result Value Ref Range   Glucose, POC 185 (H) 70 - 105 mg/dL  POCT Glucose  Result Value Ref Range    Glucose, POC 192 (H) 70 - 105 mg/dL  POCT Glucose  Result Value Ref Range   Glucose, POC 161 (H) 70 - 105 mg/dL  POCT Glucose  Result Value Ref Range   Glucose, POC 188 (H) 70 - 105 mg/dL  POCT Glucose  Result Value Ref Range   Glucose, POC 166 (H) 70 - 105 mg/dL  POCT Glucose  Result Value Ref Range   Glucose, POC 186 (H) 70 - 105 mg/dL  POCT Glucose  Result Value Ref Range   Glucose, POC 230 (H) 70 - 105 mg/dL  POCT Glucose  Result Value Ref Range   Glucose, POC 180 (H) 70 - 105 mg/dL  POCT Glucose  Result Value Ref Range   Glucose, POC 158 (H) 70 - 105 mg/dL  POCT Glucose  Result Value Ref Range   Glucose, POC 134 (H) 70 - 105 mg/dL  POCT Glucose  Result Value Ref Range   Glucose, POC 163 (H) 70 - 105 mg/dL  POCT Glucose  Result Value Ref Range   Glucose, POC 142 (H) 70 - 105 mg/dL  POCT Glucose  Result Value Ref Range   Glucose, POC 118 (H) 70 - 105 mg/dL  POCT Glucose  Result Value Ref Range   Glucose, POC 147 (H) 70 - 105 mg/dL  POCT Glucose  Result Value Ref Range   Glucose, POC 157 (H) 70 - 105 mg/dL  POCT Glucose  Result Value Ref Range   Glucose, POC 190 (H) 70 - 105 mg/dL  POCT Glucose  Result Value Ref Range   Glucose, POC 125 (H) 70 - 105 mg/dL  POCT Glucose  Result Value Ref Range   Glucose, POC 149 (H) 70 - 105 mg/dL  POCT Glucose  Result Value Ref Range   Glucose, POC 189 (H) 70 - 105 mg/dL  POCT Glucose  Result Value Ref Range   Glucose, POC 119 (H) 70 - 105 mg/dL  POCT Glucose  Result Value Ref Range   Glucose, POC 138 (H) 70 - 105 mg/dL  POCT Glucose  Result Value Ref Range   Glucose, POC 161 (H) 70 - 105 mg/dL  POCT Glucose  Result Value Ref Range   Glucose, POC 177 (H) 70 - 105 mg/dL  POCT Glucose  Result Value Ref Range   Glucose, POC 133 (H) 70 - 105 mg/dL  POCT Glucose  Result Value Ref Range   Glucose, POC 149 (H) 70 - 105 mg/dL  POCT Glucose  Result Value Ref Range   Glucose, POC 176 (H) 70 - 105 mg/dL  POCT Glucose   Result Value Ref Range   Glucose, POC 174 (H) 70 - 105 mg/dL  POCT Glucose  Result Value Ref Range   Glucose, POC 169 (H) 70 - 105 mg/dL  POCT Glucose  Result Value Ref Range   Glucose, POC 131 (H) 70 - 105 mg/dL  POCT Glucose  Result Value Ref Range   Glucose, POC 165 (H) 70 - 105 mg/dL  POCT Glucose  Result Value Ref Range   Glucose, POC 170 (H) 70 - 105 mg/dL  POCT Glucose  Result Value Ref Range   Glucose, POC 174 (H) 70 - 105 mg/dL  POCT Glucose  Result Value Ref Range   Glucose, POC 134 (H) 70 - 105 mg/dL  POCT Glucose  Result Value Ref Range   Glucose, POC 156 (H) 70 - 105 mg/dL  POCT Glucose  Result Value Ref Range   Glucose, POC 157 (H) 70 - 105 mg/dL  POCT Glucose  Result Value Ref Range   Glucose, POC 142 (H) 70 - 105 mg/dL  POCT Glucose  Result Value Ref Range   Glucose, POC 162 (H) 70 - 105 mg/dL  POCT Glucose  Result Value Ref Range   Glucose, POC 154 (H) 70 - 105 mg/dL  POCT Glucose  Result Value Ref Range   Glucose, POC 168 (H) 70 - 105 mg/dL  POCT Glucose  Result Value Ref Range   Glucose, POC 159 (H) 70 - 105 mg/dL  POCT Glucose  Result Value Ref Range   Glucose, POC 164 (H) 70 - 105 mg/dL  POCT Glucose  Result Value Ref Range   Glucose, POC 125 (H) 70 - 105 mg/dL  POCT Glucose  Result Value Ref Range   Glucose, POC 141 (H) 70 - 105 mg/dL  POCT Glucose  Result Value Ref Range   Glucose, POC 176 (H) 70 - 105 mg/dL  POCT Glucose  Result Value Ref Range   Glucose, POC 150 (H) 70 - 105 mg/dL  POCT Glucose  Result Value Ref Range   Glucose, POC 159 (H) 70 - 105 mg/dL  POCT Glucose  Result Value Ref Range   Glucose, POC 146 (H) 70 - 105 mg/dL  POCT Glucose  Result Value Ref Range   Glucose, POC 112 (H) 70 - 105 mg/dL  POCT Glucose  Result Value Ref Range   Glucose, POC 161 (H) 70 - 105 mg/dL  POCT Glucose  Result Value  Ref Range   Glucose, POC 132 (H) 70 - 105 mg/dL  POCT Glucose  Result Value Ref Range   Glucose, POC 164 (H) 70 -  105 mg/dL  POCT Glucose  Result Value Ref Range   Glucose, POC 157 (H) 70 - 105 mg/dL  POCT Glucose  Result Value Ref Range   Glucose, POC 130 (H) 70 - 105 mg/dL  POCT Glucose  Result Value Ref Range   Glucose, POC 160 (H) 70 - 105 mg/dL  POCT Glucose  Result Value Ref Range   Glucose, POC 234 (H) 70 - 105 mg/dL  POCT Glucose  Result Value Ref Range   Glucose, POC 135 (H) 70 - 105 mg/dL  POCT Glucose  Result Value Ref Range   Glucose, POC 136 (H) 70 - 105 mg/dL  CBC w/ Differential  Result Value Ref Range   WBC 9.5 4.0 - 10.5 10*9/L   RBC 4.11 3.80 - 5.10 10*12/L   HGB 12.9 11.5 - 15.0 g/dL   HCT 61.3 65.9 - 55.9 %   MCV 93.9 80.0 - 98.0 fL   MCH 31.4 27.0 - 34.0 pg   MCHC 33.4 32.0 - 36.0 g/dL   RDW 86.1 88.4 - 85.4 %   MPV 11.3 (H) 7.4 - 10.4 fL   Platelet 260 140 - 415 10*9/L   Neutrophils % 61.0 %   Lymphocytes % 22.2 %   Monocytes % 9.5 %   Eosinophils % 6.2 %   Basophils % 0.8 %   Absolute Neutrophils 5.8 1.8 - 7.8 10*9/L   Absolute Lymphocytes 2.1 0.7 - 4.5 10*9/L   Absolute Monocytes 0.9 0.1 - 1.0 10*9/L   Absolute Eosinophils 0.6 (H) 0.0 - 0.4 10*9/L   Absolute Basophils 0.1 0.0 - 0.2 10*9/L    Imaging   No results found.  Discharge Instructions   Diet Instructions   Heart Healthy Carbohydrate Consistent     Activity Instructions   Walk with assistance.     Follow Up instructions and Outpatient Referrals    Ambulatory Referral to Home Health     Reason for referral: Home Health following Rehab   Physician to follow patient's care: PCP   Disciplines requested:  Physical Therapy Occupational Therapy Nursing     Nursing requested: Teaching/skilled observation and assessment   What teaching is needed (new diagnosis? new medications?): medication  management   Physical Therapy requested:  Home safety evaluation Evaluate and treat     Occupational Therapy Requested:  Home safety evaluation Evaluate and treat     Requested [facility] Date:  12/27/2023   Requested follow up plan: I would resume responsibility.       Yancey DELENA Reno, MD

## 2024-01-03 DIAGNOSIS — G4730 Sleep apnea, unspecified: Secondary | ICD-10-CM | POA: Diagnosis not present

## 2024-01-03 DIAGNOSIS — Z9981 Dependence on supplemental oxygen: Secondary | ICD-10-CM | POA: Diagnosis not present

## 2024-01-05 DIAGNOSIS — L97521 Non-pressure chronic ulcer of other part of left foot limited to breakdown of skin: Secondary | ICD-10-CM | POA: Diagnosis not present

## 2024-01-05 DIAGNOSIS — E1122 Type 2 diabetes mellitus with diabetic chronic kidney disease: Secondary | ICD-10-CM | POA: Diagnosis not present

## 2024-01-05 DIAGNOSIS — I509 Heart failure, unspecified: Secondary | ICD-10-CM | POA: Diagnosis not present

## 2024-01-05 DIAGNOSIS — E11621 Type 2 diabetes mellitus with foot ulcer: Secondary | ICD-10-CM | POA: Diagnosis not present

## 2024-01-05 DIAGNOSIS — N1832 Chronic kidney disease, stage 3b: Secondary | ICD-10-CM | POA: Diagnosis not present

## 2024-01-05 DIAGNOSIS — I130 Hypertensive heart and chronic kidney disease with heart failure and stage 1 through stage 4 chronic kidney disease, or unspecified chronic kidney disease: Secondary | ICD-10-CM | POA: Diagnosis not present

## 2024-01-06 ENCOUNTER — Telehealth: Payer: Self-pay

## 2024-01-06 NOTE — Telephone Encounter (Signed)
 Tried to return pt's call, left a message requesting pt return call to the office.

## 2024-01-07 DIAGNOSIS — E11621 Type 2 diabetes mellitus with foot ulcer: Secondary | ICD-10-CM | POA: Diagnosis not present

## 2024-01-07 DIAGNOSIS — L97529 Non-pressure chronic ulcer of other part of left foot with unspecified severity: Secondary | ICD-10-CM | POA: Diagnosis not present

## 2024-01-07 DIAGNOSIS — L97421 Non-pressure chronic ulcer of left heel and midfoot limited to breakdown of skin: Secondary | ICD-10-CM | POA: Diagnosis not present

## 2024-01-08 DIAGNOSIS — H02831 Dermatochalasis of right upper eyelid: Secondary | ICD-10-CM | POA: Diagnosis not present

## 2024-01-08 DIAGNOSIS — Z961 Presence of intraocular lens: Secondary | ICD-10-CM | POA: Diagnosis not present

## 2024-01-08 DIAGNOSIS — H02834 Dermatochalasis of left upper eyelid: Secondary | ICD-10-CM | POA: Diagnosis not present

## 2024-01-08 DIAGNOSIS — H01004 Unspecified blepharitis left upper eyelid: Secondary | ICD-10-CM | POA: Diagnosis not present

## 2024-01-08 DIAGNOSIS — H01001 Unspecified blepharitis right upper eyelid: Secondary | ICD-10-CM | POA: Diagnosis not present

## 2024-01-08 DIAGNOSIS — H01002 Unspecified blepharitis right lower eyelid: Secondary | ICD-10-CM | POA: Diagnosis not present

## 2024-01-08 DIAGNOSIS — H26493 Other secondary cataract, bilateral: Secondary | ICD-10-CM | POA: Diagnosis not present

## 2024-01-08 DIAGNOSIS — H01005 Unspecified blepharitis left lower eyelid: Secondary | ICD-10-CM | POA: Diagnosis not present

## 2024-01-09 DIAGNOSIS — D1724 Benign lipomatous neoplasm of skin and subcutaneous tissue of left leg: Secondary | ICD-10-CM | POA: Diagnosis not present

## 2024-01-09 DIAGNOSIS — Z8673 Personal history of transient ischemic attack (TIA), and cerebral infarction without residual deficits: Secondary | ICD-10-CM | POA: Diagnosis not present

## 2024-01-09 DIAGNOSIS — Z9981 Dependence on supplemental oxygen: Secondary | ICD-10-CM | POA: Diagnosis not present

## 2024-01-09 DIAGNOSIS — F319 Bipolar disorder, unspecified: Secondary | ICD-10-CM | POA: Diagnosis not present

## 2024-01-09 DIAGNOSIS — Z6841 Body Mass Index (BMI) 40.0 and over, adult: Secondary | ICD-10-CM | POA: Diagnosis not present

## 2024-01-09 DIAGNOSIS — E039 Hypothyroidism, unspecified: Secondary | ICD-10-CM | POA: Diagnosis not present

## 2024-01-09 DIAGNOSIS — N1832 Chronic kidney disease, stage 3b: Secondary | ICD-10-CM | POA: Diagnosis not present

## 2024-01-09 DIAGNOSIS — D631 Anemia in chronic kidney disease: Secondary | ICD-10-CM | POA: Diagnosis not present

## 2024-01-09 DIAGNOSIS — F419 Anxiety disorder, unspecified: Secondary | ICD-10-CM | POA: Diagnosis not present

## 2024-01-09 DIAGNOSIS — Z7982 Long term (current) use of aspirin: Secondary | ICD-10-CM | POA: Diagnosis not present

## 2024-01-09 DIAGNOSIS — Z794 Long term (current) use of insulin: Secondary | ICD-10-CM | POA: Diagnosis not present

## 2024-01-09 DIAGNOSIS — L97521 Non-pressure chronic ulcer of other part of left foot limited to breakdown of skin: Secondary | ICD-10-CM | POA: Diagnosis not present

## 2024-01-09 DIAGNOSIS — I130 Hypertensive heart and chronic kidney disease with heart failure and stage 1 through stage 4 chronic kidney disease, or unspecified chronic kidney disease: Secondary | ICD-10-CM | POA: Diagnosis not present

## 2024-01-09 DIAGNOSIS — I5032 Chronic diastolic (congestive) heart failure: Secondary | ICD-10-CM | POA: Diagnosis not present

## 2024-01-09 DIAGNOSIS — D509 Iron deficiency anemia, unspecified: Secondary | ICD-10-CM | POA: Diagnosis not present

## 2024-01-09 DIAGNOSIS — Z7984 Long term (current) use of oral hypoglycemic drugs: Secondary | ICD-10-CM | POA: Diagnosis not present

## 2024-01-09 DIAGNOSIS — E1122 Type 2 diabetes mellitus with diabetic chronic kidney disease: Secondary | ICD-10-CM | POA: Diagnosis not present

## 2024-01-09 DIAGNOSIS — E66813 Obesity, class 3: Secondary | ICD-10-CM | POA: Diagnosis not present

## 2024-01-09 DIAGNOSIS — K760 Fatty (change of) liver, not elsewhere classified: Secondary | ICD-10-CM | POA: Diagnosis not present

## 2024-01-09 DIAGNOSIS — E11621 Type 2 diabetes mellitus with foot ulcer: Secondary | ICD-10-CM | POA: Diagnosis not present

## 2024-01-09 DIAGNOSIS — M109 Gout, unspecified: Secondary | ICD-10-CM | POA: Diagnosis not present

## 2024-01-09 DIAGNOSIS — Z87891 Personal history of nicotine dependence: Secondary | ICD-10-CM | POA: Diagnosis not present

## 2024-01-09 DIAGNOSIS — I2510 Atherosclerotic heart disease of native coronary artery without angina pectoris: Secondary | ICD-10-CM | POA: Diagnosis not present

## 2024-01-09 DIAGNOSIS — J449 Chronic obstructive pulmonary disease, unspecified: Secondary | ICD-10-CM | POA: Diagnosis not present

## 2024-01-09 DIAGNOSIS — E1142 Type 2 diabetes mellitus with diabetic polyneuropathy: Secondary | ICD-10-CM | POA: Diagnosis not present

## 2024-01-12 DIAGNOSIS — E11621 Type 2 diabetes mellitus with foot ulcer: Secondary | ICD-10-CM | POA: Diagnosis not present

## 2024-01-12 DIAGNOSIS — N1832 Chronic kidney disease, stage 3b: Secondary | ICD-10-CM | POA: Diagnosis not present

## 2024-01-12 DIAGNOSIS — L97521 Non-pressure chronic ulcer of other part of left foot limited to breakdown of skin: Secondary | ICD-10-CM | POA: Diagnosis not present

## 2024-01-12 DIAGNOSIS — I130 Hypertensive heart and chronic kidney disease with heart failure and stage 1 through stage 4 chronic kidney disease, or unspecified chronic kidney disease: Secondary | ICD-10-CM | POA: Diagnosis not present

## 2024-01-12 DIAGNOSIS — E1122 Type 2 diabetes mellitus with diabetic chronic kidney disease: Secondary | ICD-10-CM | POA: Diagnosis not present

## 2024-01-12 DIAGNOSIS — I5032 Chronic diastolic (congestive) heart failure: Secondary | ICD-10-CM | POA: Diagnosis not present

## 2024-01-13 ENCOUNTER — Telehealth: Payer: Self-pay

## 2024-01-13 DIAGNOSIS — E1165 Type 2 diabetes mellitus with hyperglycemia: Secondary | ICD-10-CM | POA: Diagnosis not present

## 2024-01-13 DIAGNOSIS — I10 Essential (primary) hypertension: Secondary | ICD-10-CM | POA: Diagnosis not present

## 2024-01-13 DIAGNOSIS — E782 Mixed hyperlipidemia: Secondary | ICD-10-CM | POA: Diagnosis not present

## 2024-01-13 DIAGNOSIS — I5032 Chronic diastolic (congestive) heart failure: Secondary | ICD-10-CM | POA: Diagnosis not present

## 2024-01-13 NOTE — Telephone Encounter (Signed)
 Tried to return pt's call, left a message requesting pt return call to the office.

## 2024-01-14 DIAGNOSIS — N1832 Chronic kidney disease, stage 3b: Secondary | ICD-10-CM | POA: Diagnosis not present

## 2024-01-14 DIAGNOSIS — E1122 Type 2 diabetes mellitus with diabetic chronic kidney disease: Secondary | ICD-10-CM | POA: Diagnosis not present

## 2024-01-14 DIAGNOSIS — J189 Pneumonia, unspecified organism: Secondary | ICD-10-CM | POA: Diagnosis not present

## 2024-01-14 DIAGNOSIS — I130 Hypertensive heart and chronic kidney disease with heart failure and stage 1 through stage 4 chronic kidney disease, or unspecified chronic kidney disease: Secondary | ICD-10-CM | POA: Diagnosis not present

## 2024-01-14 DIAGNOSIS — L97509 Non-pressure chronic ulcer of other part of unspecified foot with unspecified severity: Secondary | ICD-10-CM | POA: Diagnosis not present

## 2024-01-14 DIAGNOSIS — E1165 Type 2 diabetes mellitus with hyperglycemia: Secondary | ICD-10-CM | POA: Diagnosis not present

## 2024-01-14 DIAGNOSIS — I5032 Chronic diastolic (congestive) heart failure: Secondary | ICD-10-CM | POA: Diagnosis not present

## 2024-01-14 DIAGNOSIS — E11621 Type 2 diabetes mellitus with foot ulcer: Secondary | ICD-10-CM | POA: Diagnosis not present

## 2024-01-14 DIAGNOSIS — Z794 Long term (current) use of insulin: Secondary | ICD-10-CM | POA: Diagnosis not present

## 2024-01-14 DIAGNOSIS — L97521 Non-pressure chronic ulcer of other part of left foot limited to breakdown of skin: Secondary | ICD-10-CM | POA: Diagnosis not present

## 2024-01-19 DIAGNOSIS — I130 Hypertensive heart and chronic kidney disease with heart failure and stage 1 through stage 4 chronic kidney disease, or unspecified chronic kidney disease: Secondary | ICD-10-CM | POA: Diagnosis not present

## 2024-01-19 DIAGNOSIS — E1122 Type 2 diabetes mellitus with diabetic chronic kidney disease: Secondary | ICD-10-CM | POA: Diagnosis not present

## 2024-01-19 DIAGNOSIS — I5032 Chronic diastolic (congestive) heart failure: Secondary | ICD-10-CM | POA: Diagnosis not present

## 2024-01-19 DIAGNOSIS — N1832 Chronic kidney disease, stage 3b: Secondary | ICD-10-CM | POA: Diagnosis not present

## 2024-01-19 DIAGNOSIS — L97521 Non-pressure chronic ulcer of other part of left foot limited to breakdown of skin: Secondary | ICD-10-CM | POA: Diagnosis not present

## 2024-01-19 DIAGNOSIS — E11621 Type 2 diabetes mellitus with foot ulcer: Secondary | ICD-10-CM | POA: Diagnosis not present

## 2024-01-20 LAB — LAB REPORT - SCANNED: EGFR: 37

## 2024-01-22 DIAGNOSIS — N1832 Chronic kidney disease, stage 3b: Secondary | ICD-10-CM | POA: Diagnosis not present

## 2024-01-22 DIAGNOSIS — I130 Hypertensive heart and chronic kidney disease with heart failure and stage 1 through stage 4 chronic kidney disease, or unspecified chronic kidney disease: Secondary | ICD-10-CM | POA: Diagnosis not present

## 2024-01-22 DIAGNOSIS — I5032 Chronic diastolic (congestive) heart failure: Secondary | ICD-10-CM | POA: Diagnosis not present

## 2024-01-22 DIAGNOSIS — L97521 Non-pressure chronic ulcer of other part of left foot limited to breakdown of skin: Secondary | ICD-10-CM | POA: Diagnosis not present

## 2024-01-22 DIAGNOSIS — E11621 Type 2 diabetes mellitus with foot ulcer: Secondary | ICD-10-CM | POA: Diagnosis not present

## 2024-01-22 DIAGNOSIS — E1122 Type 2 diabetes mellitus with diabetic chronic kidney disease: Secondary | ICD-10-CM | POA: Diagnosis not present

## 2024-01-26 DIAGNOSIS — E11621 Type 2 diabetes mellitus with foot ulcer: Secondary | ICD-10-CM | POA: Diagnosis not present

## 2024-01-26 DIAGNOSIS — N1832 Chronic kidney disease, stage 3b: Secondary | ICD-10-CM | POA: Diagnosis not present

## 2024-01-26 DIAGNOSIS — I5032 Chronic diastolic (congestive) heart failure: Secondary | ICD-10-CM | POA: Diagnosis not present

## 2024-01-26 DIAGNOSIS — I130 Hypertensive heart and chronic kidney disease with heart failure and stage 1 through stage 4 chronic kidney disease, or unspecified chronic kidney disease: Secondary | ICD-10-CM | POA: Diagnosis not present

## 2024-01-26 DIAGNOSIS — L97521 Non-pressure chronic ulcer of other part of left foot limited to breakdown of skin: Secondary | ICD-10-CM | POA: Diagnosis not present

## 2024-01-26 DIAGNOSIS — E1122 Type 2 diabetes mellitus with diabetic chronic kidney disease: Secondary | ICD-10-CM | POA: Diagnosis not present

## 2024-01-28 ENCOUNTER — Encounter (INDEPENDENT_AMBULATORY_CARE_PROVIDER_SITE_OTHER): Payer: Self-pay | Admitting: Gastroenterology

## 2024-01-28 DIAGNOSIS — L97421 Non-pressure chronic ulcer of left heel and midfoot limited to breakdown of skin: Secondary | ICD-10-CM | POA: Diagnosis not present

## 2024-01-28 DIAGNOSIS — L97529 Non-pressure chronic ulcer of other part of left foot with unspecified severity: Secondary | ICD-10-CM | POA: Diagnosis not present

## 2024-01-28 DIAGNOSIS — E11621 Type 2 diabetes mellitus with foot ulcer: Secondary | ICD-10-CM | POA: Diagnosis not present

## 2024-01-30 DIAGNOSIS — I130 Hypertensive heart and chronic kidney disease with heart failure and stage 1 through stage 4 chronic kidney disease, or unspecified chronic kidney disease: Secondary | ICD-10-CM | POA: Diagnosis not present

## 2024-01-30 DIAGNOSIS — I5032 Chronic diastolic (congestive) heart failure: Secondary | ICD-10-CM | POA: Diagnosis not present

## 2024-01-30 DIAGNOSIS — E1122 Type 2 diabetes mellitus with diabetic chronic kidney disease: Secondary | ICD-10-CM | POA: Diagnosis not present

## 2024-01-30 DIAGNOSIS — N1832 Chronic kidney disease, stage 3b: Secondary | ICD-10-CM | POA: Diagnosis not present

## 2024-01-30 DIAGNOSIS — E11621 Type 2 diabetes mellitus with foot ulcer: Secondary | ICD-10-CM | POA: Diagnosis not present

## 2024-01-30 DIAGNOSIS — L97521 Non-pressure chronic ulcer of other part of left foot limited to breakdown of skin: Secondary | ICD-10-CM | POA: Diagnosis not present

## 2024-02-02 DIAGNOSIS — I5032 Chronic diastolic (congestive) heart failure: Secondary | ICD-10-CM | POA: Diagnosis not present

## 2024-02-02 DIAGNOSIS — E11621 Type 2 diabetes mellitus with foot ulcer: Secondary | ICD-10-CM | POA: Diagnosis not present

## 2024-02-02 DIAGNOSIS — L97521 Non-pressure chronic ulcer of other part of left foot limited to breakdown of skin: Secondary | ICD-10-CM | POA: Diagnosis not present

## 2024-02-02 DIAGNOSIS — N1832 Chronic kidney disease, stage 3b: Secondary | ICD-10-CM | POA: Diagnosis not present

## 2024-02-02 DIAGNOSIS — E1122 Type 2 diabetes mellitus with diabetic chronic kidney disease: Secondary | ICD-10-CM | POA: Diagnosis not present

## 2024-02-02 DIAGNOSIS — I130 Hypertensive heart and chronic kidney disease with heart failure and stage 1 through stage 4 chronic kidney disease, or unspecified chronic kidney disease: Secondary | ICD-10-CM | POA: Diagnosis not present

## 2024-02-03 DIAGNOSIS — I130 Hypertensive heart and chronic kidney disease with heart failure and stage 1 through stage 4 chronic kidney disease, or unspecified chronic kidney disease: Secondary | ICD-10-CM | POA: Diagnosis not present

## 2024-02-03 DIAGNOSIS — N1832 Chronic kidney disease, stage 3b: Secondary | ICD-10-CM | POA: Diagnosis not present

## 2024-02-03 DIAGNOSIS — I5032 Chronic diastolic (congestive) heart failure: Secondary | ICD-10-CM | POA: Diagnosis not present

## 2024-02-03 DIAGNOSIS — L97521 Non-pressure chronic ulcer of other part of left foot limited to breakdown of skin: Secondary | ICD-10-CM | POA: Diagnosis not present

## 2024-02-03 DIAGNOSIS — E1122 Type 2 diabetes mellitus with diabetic chronic kidney disease: Secondary | ICD-10-CM | POA: Diagnosis not present

## 2024-02-03 DIAGNOSIS — E11621 Type 2 diabetes mellitus with foot ulcer: Secondary | ICD-10-CM | POA: Diagnosis not present

## 2024-02-04 ENCOUNTER — Encounter: Payer: Self-pay | Admitting: "Endocrinology

## 2024-02-04 ENCOUNTER — Ambulatory Visit (INDEPENDENT_AMBULATORY_CARE_PROVIDER_SITE_OTHER): Admitting: "Endocrinology

## 2024-02-04 VITALS — BP 114/68 | HR 64 | Ht 60.0 in | Wt 242.8 lb

## 2024-02-04 DIAGNOSIS — E1159 Type 2 diabetes mellitus with other circulatory complications: Secondary | ICD-10-CM | POA: Diagnosis not present

## 2024-02-04 DIAGNOSIS — Z79899 Other long term (current) drug therapy: Secondary | ICD-10-CM | POA: Diagnosis not present

## 2024-02-04 DIAGNOSIS — Z87891 Personal history of nicotine dependence: Secondary | ICD-10-CM | POA: Diagnosis not present

## 2024-02-04 DIAGNOSIS — I10 Essential (primary) hypertension: Secondary | ICD-10-CM

## 2024-02-04 DIAGNOSIS — Z880 Allergy status to penicillin: Secondary | ICD-10-CM | POA: Diagnosis not present

## 2024-02-04 DIAGNOSIS — E039 Hypothyroidism, unspecified: Secondary | ICD-10-CM | POA: Diagnosis not present

## 2024-02-04 DIAGNOSIS — E1169 Type 2 diabetes mellitus with other specified complication: Secondary | ICD-10-CM | POA: Diagnosis not present

## 2024-02-04 DIAGNOSIS — E038 Other specified hypothyroidism: Secondary | ICD-10-CM | POA: Diagnosis not present

## 2024-02-04 DIAGNOSIS — I509 Heart failure, unspecified: Secondary | ICD-10-CM | POA: Diagnosis not present

## 2024-02-04 DIAGNOSIS — Z79890 Hormone replacement therapy (postmenopausal): Secondary | ICD-10-CM | POA: Diagnosis not present

## 2024-02-04 DIAGNOSIS — N1832 Chronic kidney disease, stage 3b: Secondary | ICD-10-CM | POA: Diagnosis not present

## 2024-02-04 DIAGNOSIS — Z794 Long term (current) use of insulin: Secondary | ICD-10-CM | POA: Diagnosis not present

## 2024-02-04 DIAGNOSIS — Z91048 Other nonmedicinal substance allergy status: Secondary | ICD-10-CM | POA: Diagnosis not present

## 2024-02-04 DIAGNOSIS — I130 Hypertensive heart and chronic kidney disease with heart failure and stage 1 through stage 4 chronic kidney disease, or unspecified chronic kidney disease: Secondary | ICD-10-CM | POA: Diagnosis not present

## 2024-02-04 DIAGNOSIS — E11621 Type 2 diabetes mellitus with foot ulcer: Secondary | ICD-10-CM | POA: Diagnosis not present

## 2024-02-04 DIAGNOSIS — E785 Hyperlipidemia, unspecified: Secondary | ICD-10-CM | POA: Diagnosis not present

## 2024-02-04 DIAGNOSIS — Z885 Allergy status to narcotic agent status: Secondary | ICD-10-CM | POA: Diagnosis not present

## 2024-02-04 DIAGNOSIS — E1140 Type 2 diabetes mellitus with diabetic neuropathy, unspecified: Secondary | ICD-10-CM | POA: Diagnosis not present

## 2024-02-04 DIAGNOSIS — E1122 Type 2 diabetes mellitus with diabetic chronic kidney disease: Secondary | ICD-10-CM | POA: Diagnosis not present

## 2024-02-04 DIAGNOSIS — L97521 Non-pressure chronic ulcer of other part of left foot limited to breakdown of skin: Secondary | ICD-10-CM | POA: Diagnosis not present

## 2024-02-04 DIAGNOSIS — I5032 Chronic diastolic (congestive) heart failure: Secondary | ICD-10-CM | POA: Diagnosis not present

## 2024-02-04 DIAGNOSIS — I110 Hypertensive heart disease with heart failure: Secondary | ICD-10-CM | POA: Diagnosis not present

## 2024-02-04 DIAGNOSIS — M5441 Lumbago with sciatica, right side: Secondary | ICD-10-CM | POA: Diagnosis not present

## 2024-02-04 DIAGNOSIS — J449 Chronic obstructive pulmonary disease, unspecified: Secondary | ICD-10-CM | POA: Diagnosis not present

## 2024-02-04 DIAGNOSIS — Z881 Allergy status to other antibiotic agents status: Secondary | ICD-10-CM | POA: Diagnosis not present

## 2024-02-04 MED ORDER — SEMAGLUTIDE (2 MG/DOSE) 8 MG/3ML ~~LOC~~ SOPN: 2.0000 mg | PEN_INJECTOR | SUBCUTANEOUS | 1 refills | Status: DC

## 2024-02-04 NOTE — Progress Notes (Signed)
 02/04/2024, 10:27 AM   Endocrinology follow-up note  Subjective:    Patient ID: Norma [name], female    DOB: November 29, 1950.  Norma [name] is being seen in follow-up after she was seen in consultation for management of currently uncontrolled symptomatic diabetes requested by  Shona Norleen PEDLAR, MD.   Past Medical History:  Diagnosis Date   Anemia    Anxiety    Bipolar disorder (HCC)    Bulging lumbar disc    L3-4   Chronic daily headache    Chronic low back pain 09/20/2014   CKD (chronic kidney disease)    stage 3   COPD (chronic obstructive pulmonary disease) (HCC)    Coronary artery disease    CABG 2019   Degenerative arthritis    Depression    Diabetes mellitus, type II (HCC)    Diabetic neuropathy (HCC) 09/16/2018   DM type 2 with diabetic peripheral neuropathy (HCC) 09/20/2014   Dyslipidemia    Dyspnea    Family history of adverse reaction to anesthesia    father had reaction to anesthesia medication per patient they don't use it anymore- unsure of reaction   Fatty liver    per pt report   Gastroparesis    GERD (gastroesophageal reflux disease)    Heart murmur    History of hiatal hernia    pt states she no longer has this   Hypertension    Hypothyroidism    IBS (irritable bowel syndrome)    LBBB (left bundle branch block)    Memory difficulty 09/20/2014   Morbid obesity (HCC)    Myocardial infarction (HCC)    per pt- prior to CABG   Neuropathy    Obstructive sleep apnea on CPAP    Renal insufficiency    Restless legs syndrome (RLS) 09/17/2012   Stroke (cerebrum) (HCC) 05/16/2017   Left parietal   Urticaria     Past Surgical History:  Procedure Laterality Date   ABDOMINAL HYSTERECTOMY     ARTHROSCOPY KNEE W/ DRILLING  06/2011   and Decemer of 2013.   Carpel tunnel  1980's   CATARACT EXTRACTION W/PHACO Right 03/21/2017   Procedure: CATARACT EXTRACTION PHACO AND  INTRAOCULAR LENS PLACEMENT RIGHT EYE;  Surgeon: Harrie Agent, MD;  Location: [facility];  Service: Ophthalmology;  Laterality: Right;  CDE: 2.91    CATARACT EXTRACTION W/PHACO Left 04/18/2017   Procedure: CATARACT EXTRACTION PHACO AND INTRAOCULAR LENS PLACEMENT LEFT EYE;  Surgeon: Harrie Agent, MD;  Location: [facility];  Service: Ophthalmology;  Laterality: Left;  left   CHOLECYSTECTOMY     COLONOSCOPY WITH PROPOFOL  N/A 10/04/2016   Procedure: COLONOSCOPY WITH PROPOFOL ;  Surgeon: Golda Claudis PENNER, MD;  Location: [facility];  Service: Endoscopy;  Laterality: N/A;  11:10   CORONARY ARTERY BYPASS GRAFT N/A 08/29/2017   Procedure: CORONARY ARTERY BYPASS GRAFTING (CABG) x 3 WITH ENDOSCOPIC HARVESTING OF RIGHT SAPHENOUS VEIN;  Surgeon: Fleeta Hanford Coy, MD;  Location: [facility] OR;  Service: Open Heart Surgery;  Laterality: N/A;   ESOPHAGOGASTRODUODENOSCOPY N/A 04/25/2016   Procedure: ESOPHAGOGASTRODUODENOSCOPY (EGD);  Surgeon: Claudis PENNER Golda, MD;  [facility];  Service: Endoscopy;  Laterality: N/A;  730   ESOPHAGOGASTRODUODENOSCOPY  03/2023   LEFT HEART CATH AND CORONARY ANGIOGRAPHY N/A 08/27/2017   Procedure: LEFT HEART CATH AND CORONARY ANGIOGRAPHY;  Surgeon: Wonda Sharper, MD;  Location: [facility] INVASIVE CV LAB;  Service: Cardiovascular::  pLAD 95% - p-mLAD 50%, ostD1 90% -pD1 80%; ostOM1 90%; rPDA 80%. EF ~50-55% - HK of dital Anterolateral & Apical wall.  - Rec CVTS c/s   NECK SURGERY     pt reports having growth removed from the back of her neck- after 2021   OPEN REDUCTION INTERNAL FIXATION (ORIF) HAND Left 04/10/2020   Procedure: OPEN REDUCTION INTERNAL FIXATION (ORIF) HAND, left index finger;  Surgeon: Margrette Taft BRAVO, MD;  Location: [facility];  Service: Orthopedics;  Laterality: Left;  0.045 k wires   RECTAL SURGERY     fissure   SHOULDER SURGERY Left    arthroscopy in March of this year   TEE WITHOUT CARDIOVERSION N/A 08/29/2017   Procedure: TRANSESOPHAGEAL ECHOCARDIOGRAM (TEE);   Surgeon: Fleeta Ochoa, Maude, MD;  Location: [facility] OR;  Service: Open Heart Surgery;  Laterality: N/A;   TRANSFORAMINAL LUMBAR INTERBODY FUSION W/ MIS 1 LEVEL Right 07/12/2021   Procedure: Minimally Invasive Surgery Transforaminal Lumbar Interbody Fusion  Lumbar four-five;  Surgeon: Debby Dorn MATSU, MD;  Location: [facility] OR;  Service: Neurosurgery;  Laterality: Right;   TRANSTHORACIC ECHOCARDIOGRAM  06/06/2017   Mild to moderate reduced EF 40 and 45%.  Anterior septal, inferoseptal and basal to mid inferior hypokinesis.  GR 1 DD.  No significant valvular lesion    Social History   Socioeconomic History   Marital status: Single    Spouse name: Not on file   Number of children: 0   Years of education: 16   Highest education level: Not on file  Occupational History    Employer: [employer]  Tobacco Use   Smoking status: Former    Current packs/day: 0.00    Types: Cigarettes    Quit date: 02/04/1971    Years since quitting: 53.0   Smokeless tobacco: Never   Tobacco comments:    smoked 2 cigarettes a day  Vaping Use   Vaping status: Never Used  Substance and Sexual Activity   Alcohol use: No    Alcohol/week: 0.0 standard drinks of alcohol   Drug use: No   Sexual activity: Never  Other Topics Concern   Not on file  Social History Narrative   Patient lives at home alone.    Patient has no children.    Patient has her masters in nursing.    Patient is single.    Patient drinks about 2 glasses of tea daily.   Patient is right handed.   Social Drivers of Health   Financial Resource Strain: Medium Risk (12/18/2021)   Overall Financial Resource Strain (CARDIA)    Difficulty of Paying Living Expenses: Somewhat hard  Food Insecurity: No Food Insecurity (04/13/2023)   Hunger Vital Sign    Worried About Running Out of Food in the Last Year: Never true    Ran Out of Food in the Last Year: Never true  Transportation Needs: No Transportation Needs (12/18/2023)   [facility] from [facility] - Transportation    Lack of Transportation (Medical): No    Lack of Transportation (Non-Medical): No  Physical Activity: Not on file  Stress: No Stress Concern Present (10/04/2021)   [employer] [facility] [employer] [facility] - Occupational Stress  Questionnaire    Feeling of Stress : Only a little  Social Connections: Not on file    Family History  Problem Relation Age of Onset   Hypertension Mother    Lymphoma Mother    Depression Mother    Arthritis Father    Alcohol abuse Father    Hypertension Sister    Cancer Brother        kidney and lung   Alcohol abuse Brother    Alcohol abuse Paternal Uncle    Alcohol abuse Paternal Grandfather    Alcohol abuse Paternal Grandmother    Allergic rhinitis Neg Hx    Angioedema Neg Hx    Asthma Neg Hx    Atopy Neg Hx    Eczema Neg Hx    Immunodeficiency Neg Hx    Urticaria Neg Hx     Outpatient Encounter Medications as of 02/04/2024  Medication Sig   Semaglutide , 2 MG/DOSE, 8 MG/3ML SOPN Inject 2 mg as directed once a week.   [DISCONTINUED] insulin  glargine, 1 Unit Dial, (TOUJEO  SOLOSTAR) 300 UNIT/ML Solostar Pen Inject 50 Units into the skin at bedtime. (Patient taking differently: Inject 5 Units into the skin at bedtime.)   acetaminophen  (TYLENOL ) 500 MG tablet Take 1,000 mg by mouth daily as needed for moderate pain (pain score 4-6), fever or headache.   ARIPiprazole  (ABILIFY ) 5 MG tablet Take 1 tablet (5 mg total) by mouth daily.   aspirin  EC 81 MG tablet Take 1 tablet (81 mg total) by mouth daily.   azelastine (ASTELIN) 0.1 % nasal spray Place 1 spray into both nostrils 2 (two) times daily as needed for allergies.   buPROPion  (WELLBUTRIN  XL) 150 MG 24 hr tablet Take 1 tablet (150 mg total) by mouth every morning.   cetirizine  (ZYRTEC ) 10 MG chewable tablet Chew 10 mg by mouth daily.   Cholecalciferol  (VITAMIN D3) 50 MCG (2000 UT) capsule Take 1 capsule (2,000 Units total) by mouth daily with lunch.   diclofenac  Sodium (VOLTAREN) 1 % GEL Apply 2 g topically 4 (four) times daily as needed (pain).   EASY TOUCH PEN NEEDLES 31G X 6 MM MISC USE AS DIRECTED with Toujeo  (she wants 6mm)   EPINEPHrine  0.3 mg/0.3 mL IJ SOAJ injection Inject 0.3 mg into the muscle as needed.   gabapentin  (NEURONTIN ) 400 MG capsule Take 400-800 mg by mouth See admin instructions. Take 1 capsule (400mg ) every morning and 2 capsules (800mg ) at bedtime.   JARDIANCE  10 MG TABS tablet TAKE 1 TABLET (10 MG TOTAL) BY MOUTH DAILY WITH BREAKFAST.   lamoTRIgine  (LAMICTAL ) 150 MG tablet Take 1 tablet (150 mg total) by mouth at bedtime.   levothyroxine  (SYNTHROID ) 112 MCG tablet Take 112 mcg by mouth daily.   linaclotide (LINZESS) 145 MCG CAPS capsule Take 145 mcg by mouth daily before breakfast.   losartan  (COZAAR ) 25 MG tablet Take 25 mg by mouth daily.   pantoprazole  (PROTONIX ) 40 MG tablet TAKE 1 TABLET BY MOUTH AT BEDTIME   propranolol  (INDERAL ) 40 MG tablet Take 1 tablet (40 mg total) by mouth 2 (two) times daily.   REPATHA  SURECLICK 140 MG/ML SOAJ ADMINISTER 1 ML UNDER THE SKIN EVERY 14 DAYS   rOPINIRole  (REQUIP ) 3 MG tablet TAKE ONE TABLET BY MOUTH EVERY MORNING, ,NOON AND AT BEDTIME. (PRT PT MORNING,EVENING,BEDTIME) (Patient taking differently: Take 3-6 mg by mouth See admin instructions. Take 1 tablet (3mg ) every morning and 2 tablets (6mg ) at bedtime.)   tiZANidine  (ZANAFLEX ) 2 MG tablet Take 2 mg  by mouth 2 (two) times daily.   triamcinolone  cream (KENALOG) 0.1 % SMARTSIG:sparingly Topical Twice Daily   venlafaxine  XR (EFFEXOR -XR) 150 MG 24 hr capsule TAKE TWO (2) CAPSULES BY MOUTH AT BEDTIME   [DISCONTINUED] Semaglutide , 1 MG/DOSE, 4 MG/3ML SOPN Inject 1 mg as directed once a week.   No facility-administered encounter medications on file as of 02/04/2024.    ALLERGIES: Allergies  Allergen Reactions   Amoxil [Amoxicillin] Anaphylaxis   Hydrocodone Anaphylaxis   Percocet [Oxycodone -Acetaminophen ] Other (See Comments)    Causes  chest pain /tightness. Pressure pain around her ribs.   Depacon [Valproic Acid] Other (See Comments)    Causes falls    Prednisone Itching   Roxicodone  [Oxycodone ] Other (See Comments)    Chest pain, tightness    VACCINATION STATUS: Immunization History  Administered Date(s) Administered    Astrazeneca Covid-19 Vaccine, Pf, 0.5 Ml Non Us   01/29/2023   Influenza Inj Mdck Quad Pf 02/12/2016   Influenza,inj,Quad PF,6+ Mos 01/20/2018   Influenza,inj,quad, With Preservative 04/25/2022   Influenza-Unspecified 01/29/2023   Janssen (J&J) SARS-COV-2 Vaccination 03/17/2020   PNEUMOCOCCAL CONJUGATE-20 05/01/2023   Pneumococcal Polysaccharide-23 01/30/2015   Td,absorbed, Preservative Free, Adult Use, Lf Unspecified 07/31/1999   Tdap 07/31/1999, 04/08/2020    Diabetes She presents for her follow-up diabetic visit. She has type 2 diabetes mellitus. Onset time: She was diagnosed at approximate age of 47 years.  This patient was previously seen in this clinic for diabetic management, last visit was in 2021. Her disease course has been improving. There are no hypoglycemic associated symptoms. Pertinent negatives for hypoglycemia include no confusion, headaches, pallor or seizures. Associated symptoms include fatigue. Pertinent negatives for diabetes include no blurred vision, no chest pain, no polydipsia, no polyphagia and no polyuria. There are no hypoglycemic complications. Symptoms are improving. Diabetic complications include heart disease and nephropathy. Risk factors for coronary artery disease include dyslipidemia, diabetes mellitus, hypertension, obesity, family history, post-menopausal, sedentary lifestyle and tobacco exposure. Current diabetic treatment includes insulin  injections. Her weight is decreasing steadily. She is following a generally unhealthy diet. When asked about meal planning, she reported none. She has had a previous visit with a dietitian. She rarely participates in exercise. Her  home blood glucose trend is decreasing steadily. Her breakfast blood glucose range is generally 140-180 mg/dl. Her lunch blood glucose range is generally 140-180 mg/dl. Her dinner blood glucose range is generally 140-180 mg/dl. Her bedtime blood glucose range is generally 140-180 mg/dl. Her overall blood glucose range is 140-180 mg/dl. (She presents with continued improvement in her glycemic profile.  Her average blood glucose is 146 mg per DL.  Her glucose variability is 18.4%.  Her AGP report shows 89% time in range, 11% level 1 hyperglycemia.  She has no hypoglycemia.  Her recent A1c was 7.2% progressively improving.  This is when she continues to drop the dose of her Toujeo  all the way to 5 units.  ) An ACE inhibitor/angiotensin II receptor blocker is being taken. Eye exam is current.  Hypertension This is a chronic problem. Pertinent negatives include no blurred vision, chest pain, headaches, palpitations or shortness of breath. Risk factors for coronary artery disease include dyslipidemia, diabetes mellitus, family history, obesity, post-menopausal state, smoking/tobacco exposure and sedentary lifestyle. Past treatments include angiotensin blockers. Hypertensive end-organ damage includes CAD/MI. Identifiable causes of hypertension include chronic renal disease.    Objective:       02/04/2024   10:07 AM 11/12/2023    3:36 PM 10/09/2023    9:58 AM  Vitals with BMI  Height 5' 0 5' 0 5' 0  Weight 242 lbs 13 oz 253 lbs 253 lbs 10 oz  BMI 47.42 49.41 49.53  Systolic 114 112 887  Diastolic 68 64 58  Pulse 64 70 60    BP 114/68   Pulse 64   Ht 5' (1.524 m)   Wt 242 lb 12.8 oz (110.1 kg)   BMI 47.42 kg/m   Wt Readings from Last 3 Encounters:  02/04/24 242 lb 12.8 oz (110.1 kg)  11/12/23 253 lb (114.8 kg)  10/09/23 253 lb 9.6 oz (115 kg)       CMP ( most recent) CMP     Component Value Date/Time   NA 138 04/22/2023 0000   K 5.0 04/22/2023 0000   CL 99 04/22/2023 0000   CO2 26  (A) 04/22/2023 0000   GLUCOSE 186 (H) 04/14/2023 0449   BUN 24 (A) 04/22/2023 0000   CREATININE 1.5 (A) 04/22/2023 0000   CREATININE 1.36 (H) 04/14/2023 0449   CREATININE 1.10 (H) 03/05/2019 1221   CALCIUM  9.4 04/22/2023 0000   PROT 8.2 (H) 04/13/2023 1846   PROT 6.9 01/11/2021 1014   ALBUMIN  3.8 04/22/2023 0000   ALBUMIN  4.1 01/11/2021 1014   AST 11 (A) 04/22/2023 0000   ALT 12 04/22/2023 0000   ALKPHOS 81 04/22/2023 0000   BILITOT 0.7 04/13/2023 1846   BILITOT 0.2 01/11/2021 1014   GFRNONAA 42 (L) 04/14/2023 0449   GFRNONAA 23 10/09/2022 0000   GFRNONAA 53 (L) 03/05/2019 1221   GFRAA 56 (L) 04/23/2019 1511   GFRAA 61 03/05/2019 1221     Diabetic Labs (most recent): Lab Results  Component Value Date   HGBA1C 7.7 04/22/2023   HGBA1C 7.8 (A) 02/04/2023   HGBA1C 9.6 10/09/2022   MICROALBUR 0.9 01/29/2018   MICROALBUR 1.5 07/10/2016     Lipid Panel ( most recent) Lipid Panel     Component Value Date/Time   CHOL 173 04/22/2023 0000   TRIG 147 04/22/2023 0000   HDL 41 04/22/2023 0000   CHOLHDL 3.8 03/05/2019 1221   VLDL 31 (H) 07/10/2016 0934   LDLCALC 106 04/22/2023 0000   LDLCALC 152 (H) 03/05/2019 1221      Lab Results  Component Value Date   TSH 1.79 04/23/2023   TSH 1.79 04/22/2023   TSH 1.304 04/14/2023   TSH 1.73 10/09/2022   TSH 1.49 03/05/2019   TSH 2.671 10/30/2018   TSH 3.05 04/24/2018   TSH 2.16 01/29/2018   TSH 3.71 07/11/2017   TSH 2.13 07/10/2016   FREET4 1.37 04/23/2023   FREET4 1.2 03/05/2019   FREET4 1.05 10/30/2018   FREET4 1.2 01/29/2018   FREET4 1.4 07/11/2017   FREET4 1.0 07/10/2016   FREET4 1.22 05/12/2015       Assessment & Plan:   1. DM type 2 causing vascular disease  - Norma [name] has currently uncontrolled symptomatic type 2 DM since  73 years of age.  She presents with continued improvement in her glycemic profile.  Her average blood glucose is 146 mg per DL.  Her glucose variability is 18.4%.  Her AGP report  shows 89% time in range, 11% level 1 hyperglycemia.  She has no hypoglycemia.  Her recent A1c was 7.2% progressively improving.  This is when she continues to drop the dose of her Toujeo  all the way to 5 units.  - Recent labs reviewed. - I had a long discussion with her about the possible risk factors  and  the pathology behind its diabetes and its complications. -her diabetes is complicated by coronary artery disease, CKD, obesity/sedentary life, polypharmacy, and she remains at a high risk for more acute and chronic complications which include CAD, CVA, CKD, retinopathy, and neuropathy. These are all discussed in detail with her.  - I discussed all available options of managing her diabetes including de-escalation of medications. I have counseled her on Food as Medicine by adopting a Whole Food , Plant Predominant  ( WFPP) nutrition as recommended by [employer] of Lifestyle Medicine. Patient is encouraged to switch to  unprocessed or minimally processed  complex starch, adequate protein intake (mainly plant source), minimal liquid fat, plenty of fruits, and vegetables. -  she is advised to stick to a routine mealtimes to eat 3 complete meals a day and snack only when necessary ( to snack only to correct hypoglycemia BG <70 day time or <100 at night).   -Her engagement with lifestyle medicine nutrition is suboptimal, however she acknowledges that there is a room for improvement in her food and drink choices.  - Suggestion is made for her to avoid simple carbohydrates  from her diet including Cakes, Sweet Desserts, Ice Cream, Soda (diet and regular), Sweet Tea, Candies, Chips, Cookies, Store Bought Juices, Alcohol , Artificial Sweeteners,  Coffee Creamer, and Sugar-free Products, Lemonade. This will help patient to have more stable blood glucose profile and potentially avoid unintended weight gain.    - I have approached her with the following individualized plan to manage  her diabetes and  patient agrees:    She is only taking 5 units of Toujeo  nightly.   - Based on her presentation with near target glycemic profile, she will be a chance to come off of insulin ,  advised to discontinue all insulin .  - She is benefiting from her GLP-1 receptor agonist, discussed and increased her Ozempic  to 2 mg subcutaneously weekly.   Side effects and precautions discussed with her. - She has access for Jardiance , advised to continue Jardiance  10 mg p.o. daily at breakfast.   - she is encouraged to call clinic for blood glucose levels less than 70 or above 180 mg /dl weekly average.  - Specific targets for  A1c;  LDL, HDL,  and Triglycerides were discussed with the patient.  2) Blood Pressure /Hypertension:   Blood pressure is controlled to target.   she is advised to continue her current medications including losartan  50 mg p.o. daily with breakfast . 3) Lipids/Hyperlipidemia:   Review of her recent lipid panel showed uncontrolled lipid panel with LDL at 106.     she  is advised to continue Repatha  140 mg subcutaneously every other week.  4)  Weight/Diet:  Body mass index is 47.42 kg/m.  -   clearly complicating her diabetes care.   she is  a candidate for weight loss. I discussed with her the fact that loss of 5 - 10% of her  current body weight will have the most impact on her diabetes management.  The above detailed  ACLM recommendations for nutrition, exercise, sleep, social life, avoidance of risky substances, the need for restorative sleep   information will also detailed on discharge instructions.  5) hypothyroidism: Longstanding diagnosis.  No recent thyroid  function test to review.  She is advised to continue levothyroxine  112 mcg p.o. daily before breakfast.     - We discussed about the correct intake of her thyroid  hormone, on empty stomach at fasting, with water , separated by  at least 30 minutes from breakfast and other medications,  and separated by more than 4 hours from calcium ,  iron, multivitamins, acid reflux medications (PPIs). -Patient is made aware of the fact that thyroid  hormone replacement is needed for life, dose to be adjusted by periodic monitoring of thyroid  function tests.   6) vitamin D  deficiency: She is status post treatment with vitamin D  2 50,000 units for several weeks.  she is advised to switch to vitamin D3 2000 units daily for maintenance.   7) Chronic Care/Health Maintenance:  -she  is on ARB  medications and  is encouraged to initiate and continue to follow up with Ophthalmology, Dentist,  Podiatrist at least yearly or according to recommendations, and advised to   stay away from smoking. I have recommended yearly flu vaccine and pneumonia vaccine at least every 5 years; moderate intensity exercise for up to 150 minutes weekly; and  sleep for 7- 9 hours a day.  - she is  advised to maintain close follow up with Shona Norleen PEDLAR, MD for primary care needs, as well as her other providers for optimal and coordinated care.  I spent  40  minutes in the care of the patient today including review of labs from CMP, Lipids, Thyroid  Function, Hematology (current and previous including abstractions from other facilities); face-to-face time discussing  her blood glucose readings/logs, discussing hypoglycemia and hyperglycemia episodes and symptoms, medications doses, her options of short and long term treatment based on the latest standards of care / guidelines;  discussion about incorporating lifestyle medicine;  and documenting the encounter. Risk reduction counseling performed per USPSTF guidelines to reduce  obesity and cardiovascular risk factors.     Please refer to Patient Instructions for Blood Glucose Monitoring and Insulin /Medications Dosing Guide  in media tab for additional information. Please  also refer to  Patient Self Inventory in the Media  tab for reviewed elements of pertinent patient history.  Norma [name] participated in the discussions,  expressed understanding, and voiced agreement with the above plans.  All questions were answered to her satisfaction. she is encouraged to contact clinic should she have any questions or concerns prior to her return visit.   Follow up plan: - Return in about 3 months (around 05/06/2024) for Bring Meter/CGM Device/Logs- A1c in Office.  Ranny Earl, MD [facility] [address] [place], [place] [address] Phone: [phone]  Fax: [phone]    02/04/2024, 10:27 AM  This note was partially dictated with voice recognition software. Similar sounding words can be transcribed inadequately or may not  be corrected upon review.

## 2024-02-04 NOTE — Patient Instructions (Signed)

## 2024-02-05 ENCOUNTER — Other Ambulatory Visit: Payer: Self-pay | Admitting: "Endocrinology

## 2024-02-06 DIAGNOSIS — I130 Hypertensive heart and chronic kidney disease with heart failure and stage 1 through stage 4 chronic kidney disease, or unspecified chronic kidney disease: Secondary | ICD-10-CM | POA: Diagnosis not present

## 2024-02-06 DIAGNOSIS — I5032 Chronic diastolic (congestive) heart failure: Secondary | ICD-10-CM | POA: Diagnosis not present

## 2024-02-06 DIAGNOSIS — N1832 Chronic kidney disease, stage 3b: Secondary | ICD-10-CM | POA: Diagnosis not present

## 2024-02-06 DIAGNOSIS — L97521 Non-pressure chronic ulcer of other part of left foot limited to breakdown of skin: Secondary | ICD-10-CM | POA: Diagnosis not present

## 2024-02-06 DIAGNOSIS — E11621 Type 2 diabetes mellitus with foot ulcer: Secondary | ICD-10-CM | POA: Diagnosis not present

## 2024-02-06 DIAGNOSIS — E1122 Type 2 diabetes mellitus with diabetic chronic kidney disease: Secondary | ICD-10-CM | POA: Diagnosis not present

## 2024-02-08 DIAGNOSIS — I2510 Atherosclerotic heart disease of native coronary artery without angina pectoris: Secondary | ICD-10-CM | POA: Diagnosis not present

## 2024-02-08 DIAGNOSIS — D509 Iron deficiency anemia, unspecified: Secondary | ICD-10-CM | POA: Diagnosis not present

## 2024-02-08 DIAGNOSIS — F319 Bipolar disorder, unspecified: Secondary | ICD-10-CM | POA: Diagnosis not present

## 2024-02-08 DIAGNOSIS — J449 Chronic obstructive pulmonary disease, unspecified: Secondary | ICD-10-CM | POA: Diagnosis not present

## 2024-02-08 DIAGNOSIS — Z6841 Body Mass Index (BMI) 40.0 and over, adult: Secondary | ICD-10-CM | POA: Diagnosis not present

## 2024-02-08 DIAGNOSIS — I130 Hypertensive heart and chronic kidney disease with heart failure and stage 1 through stage 4 chronic kidney disease, or unspecified chronic kidney disease: Secondary | ICD-10-CM | POA: Diagnosis not present

## 2024-02-08 DIAGNOSIS — Z7982 Long term (current) use of aspirin: Secondary | ICD-10-CM | POA: Diagnosis not present

## 2024-02-08 DIAGNOSIS — Z87891 Personal history of nicotine dependence: Secondary | ICD-10-CM | POA: Diagnosis not present

## 2024-02-08 DIAGNOSIS — L97521 Non-pressure chronic ulcer of other part of left foot limited to breakdown of skin: Secondary | ICD-10-CM | POA: Diagnosis not present

## 2024-02-08 DIAGNOSIS — F419 Anxiety disorder, unspecified: Secondary | ICD-10-CM | POA: Diagnosis not present

## 2024-02-08 DIAGNOSIS — E11621 Type 2 diabetes mellitus with foot ulcer: Secondary | ICD-10-CM | POA: Diagnosis not present

## 2024-02-08 DIAGNOSIS — E039 Hypothyroidism, unspecified: Secondary | ICD-10-CM | POA: Diagnosis not present

## 2024-02-08 DIAGNOSIS — E1142 Type 2 diabetes mellitus with diabetic polyneuropathy: Secondary | ICD-10-CM | POA: Diagnosis not present

## 2024-02-08 DIAGNOSIS — D631 Anemia in chronic kidney disease: Secondary | ICD-10-CM | POA: Diagnosis not present

## 2024-02-08 DIAGNOSIS — E1122 Type 2 diabetes mellitus with diabetic chronic kidney disease: Secondary | ICD-10-CM | POA: Diagnosis not present

## 2024-02-08 DIAGNOSIS — D1724 Benign lipomatous neoplasm of skin and subcutaneous tissue of left leg: Secondary | ICD-10-CM | POA: Diagnosis not present

## 2024-02-08 DIAGNOSIS — M109 Gout, unspecified: Secondary | ICD-10-CM | POA: Diagnosis not present

## 2024-02-08 DIAGNOSIS — Z9981 Dependence on supplemental oxygen: Secondary | ICD-10-CM | POA: Diagnosis not present

## 2024-02-08 DIAGNOSIS — Z794 Long term (current) use of insulin: Secondary | ICD-10-CM | POA: Diagnosis not present

## 2024-02-08 DIAGNOSIS — E66813 Obesity, class 3: Secondary | ICD-10-CM | POA: Diagnosis not present

## 2024-02-08 DIAGNOSIS — K760 Fatty (change of) liver, not elsewhere classified: Secondary | ICD-10-CM | POA: Diagnosis not present

## 2024-02-08 DIAGNOSIS — I5032 Chronic diastolic (congestive) heart failure: Secondary | ICD-10-CM | POA: Diagnosis not present

## 2024-02-08 DIAGNOSIS — Z8673 Personal history of transient ischemic attack (TIA), and cerebral infarction without residual deficits: Secondary | ICD-10-CM | POA: Diagnosis not present

## 2024-02-08 DIAGNOSIS — Z7984 Long term (current) use of oral hypoglycemic drugs: Secondary | ICD-10-CM | POA: Diagnosis not present

## 2024-02-08 DIAGNOSIS — N1832 Chronic kidney disease, stage 3b: Secondary | ICD-10-CM | POA: Diagnosis not present

## 2024-02-09 DIAGNOSIS — E1165 Type 2 diabetes mellitus with hyperglycemia: Secondary | ICD-10-CM | POA: Diagnosis not present

## 2024-02-09 DIAGNOSIS — J309 Allergic rhinitis, unspecified: Secondary | ICD-10-CM | POA: Diagnosis not present

## 2024-02-09 DIAGNOSIS — I10 Essential (primary) hypertension: Secondary | ICD-10-CM | POA: Diagnosis not present

## 2024-02-09 DIAGNOSIS — N1832 Chronic kidney disease, stage 3b: Secondary | ICD-10-CM | POA: Diagnosis not present

## 2024-02-09 DIAGNOSIS — E039 Hypothyroidism, unspecified: Secondary | ICD-10-CM | POA: Diagnosis not present

## 2024-02-09 DIAGNOSIS — L97521 Non-pressure chronic ulcer of other part of left foot limited to breakdown of skin: Secondary | ICD-10-CM | POA: Diagnosis not present

## 2024-02-09 DIAGNOSIS — E782 Mixed hyperlipidemia: Secondary | ICD-10-CM | POA: Diagnosis not present

## 2024-02-09 DIAGNOSIS — F3181 Bipolar II disorder: Secondary | ICD-10-CM | POA: Diagnosis not present

## 2024-02-09 DIAGNOSIS — I130 Hypertensive heart and chronic kidney disease with heart failure and stage 1 through stage 4 chronic kidney disease, or unspecified chronic kidney disease: Secondary | ICD-10-CM | POA: Diagnosis not present

## 2024-02-09 DIAGNOSIS — E1122 Type 2 diabetes mellitus with diabetic chronic kidney disease: Secondary | ICD-10-CM | POA: Diagnosis not present

## 2024-02-09 DIAGNOSIS — I2510 Atherosclerotic heart disease of native coronary artery without angina pectoris: Secondary | ICD-10-CM | POA: Diagnosis not present

## 2024-02-09 DIAGNOSIS — K219 Gastro-esophageal reflux disease without esophagitis: Secondary | ICD-10-CM | POA: Diagnosis not present

## 2024-02-09 DIAGNOSIS — R251 Tremor, unspecified: Secondary | ICD-10-CM | POA: Diagnosis not present

## 2024-02-09 DIAGNOSIS — M79604 Pain in right leg: Secondary | ICD-10-CM | POA: Diagnosis not present

## 2024-02-09 DIAGNOSIS — I5032 Chronic diastolic (congestive) heart failure: Secondary | ICD-10-CM | POA: Diagnosis not present

## 2024-02-09 DIAGNOSIS — E11621 Type 2 diabetes mellitus with foot ulcer: Secondary | ICD-10-CM | POA: Diagnosis not present

## 2024-02-10 DIAGNOSIS — L84 Corns and callosities: Secondary | ICD-10-CM | POA: Diagnosis not present

## 2024-02-10 DIAGNOSIS — B351 Tinea unguium: Secondary | ICD-10-CM | POA: Diagnosis not present

## 2024-02-10 DIAGNOSIS — I5032 Chronic diastolic (congestive) heart failure: Secondary | ICD-10-CM | POA: Diagnosis not present

## 2024-02-10 DIAGNOSIS — E1142 Type 2 diabetes mellitus with diabetic polyneuropathy: Secondary | ICD-10-CM | POA: Diagnosis not present

## 2024-02-10 DIAGNOSIS — M79675 Pain in left toe(s): Secondary | ICD-10-CM | POA: Diagnosis not present

## 2024-02-10 DIAGNOSIS — E1122 Type 2 diabetes mellitus with diabetic chronic kidney disease: Secondary | ICD-10-CM | POA: Diagnosis not present

## 2024-02-10 DIAGNOSIS — E11621 Type 2 diabetes mellitus with foot ulcer: Secondary | ICD-10-CM | POA: Diagnosis not present

## 2024-02-10 DIAGNOSIS — I130 Hypertensive heart and chronic kidney disease with heart failure and stage 1 through stage 4 chronic kidney disease, or unspecified chronic kidney disease: Secondary | ICD-10-CM | POA: Diagnosis not present

## 2024-02-10 DIAGNOSIS — L97521 Non-pressure chronic ulcer of other part of left foot limited to breakdown of skin: Secondary | ICD-10-CM | POA: Diagnosis not present

## 2024-02-10 DIAGNOSIS — M79674 Pain in right toe(s): Secondary | ICD-10-CM | POA: Diagnosis not present

## 2024-02-10 DIAGNOSIS — N1832 Chronic kidney disease, stage 3b: Secondary | ICD-10-CM | POA: Diagnosis not present

## 2024-02-11 ENCOUNTER — Other Ambulatory Visit (HOSPITAL_COMMUNITY): Payer: Self-pay | Admitting: Surgery

## 2024-02-11 DIAGNOSIS — M5416 Radiculopathy, lumbar region: Secondary | ICD-10-CM | POA: Diagnosis not present

## 2024-02-11 DIAGNOSIS — E039 Hypothyroidism, unspecified: Secondary | ICD-10-CM | POA: Diagnosis not present

## 2024-02-11 DIAGNOSIS — Z794 Long term (current) use of insulin: Secondary | ICD-10-CM | POA: Diagnosis not present

## 2024-02-11 DIAGNOSIS — Z79899 Other long term (current) drug therapy: Secondary | ICD-10-CM | POA: Diagnosis not present

## 2024-02-11 DIAGNOSIS — L97529 Non-pressure chronic ulcer of other part of left foot with unspecified severity: Secondary | ICD-10-CM | POA: Diagnosis not present

## 2024-02-11 DIAGNOSIS — J449 Chronic obstructive pulmonary disease, unspecified: Secondary | ICD-10-CM | POA: Diagnosis not present

## 2024-02-11 DIAGNOSIS — E11621 Type 2 diabetes mellitus with foot ulcer: Secondary | ICD-10-CM | POA: Diagnosis not present

## 2024-02-11 DIAGNOSIS — I10 Essential (primary) hypertension: Secondary | ICD-10-CM | POA: Diagnosis not present

## 2024-02-11 DIAGNOSIS — M4326 Fusion of spine, lumbar region: Secondary | ICD-10-CM | POA: Diagnosis not present

## 2024-02-11 DIAGNOSIS — L97421 Non-pressure chronic ulcer of left heel and midfoot limited to breakdown of skin: Secondary | ICD-10-CM | POA: Diagnosis not present

## 2024-02-12 ENCOUNTER — Telehealth: Payer: Self-pay | Admitting: Nurse Practitioner

## 2024-02-12 DIAGNOSIS — H26491 Other secondary cataract, right eye: Secondary | ICD-10-CM | POA: Diagnosis not present

## 2024-02-12 NOTE — Telephone Encounter (Signed)
 Called and let pts sister know that PAP of ozempic  was ready for pick up

## 2024-02-13 ENCOUNTER — Telehealth (HOSPITAL_COMMUNITY): Admitting: Psychiatry

## 2024-02-13 DIAGNOSIS — I5032 Chronic diastolic (congestive) heart failure: Secondary | ICD-10-CM | POA: Diagnosis not present

## 2024-02-13 DIAGNOSIS — I10 Essential (primary) hypertension: Secondary | ICD-10-CM | POA: Diagnosis not present

## 2024-02-13 DIAGNOSIS — L97521 Non-pressure chronic ulcer of other part of left foot limited to breakdown of skin: Secondary | ICD-10-CM | POA: Diagnosis not present

## 2024-02-13 DIAGNOSIS — I130 Hypertensive heart and chronic kidney disease with heart failure and stage 1 through stage 4 chronic kidney disease, or unspecified chronic kidney disease: Secondary | ICD-10-CM | POA: Diagnosis not present

## 2024-02-13 DIAGNOSIS — E11621 Type 2 diabetes mellitus with foot ulcer: Secondary | ICD-10-CM | POA: Diagnosis not present

## 2024-02-13 DIAGNOSIS — E782 Mixed hyperlipidemia: Secondary | ICD-10-CM | POA: Diagnosis not present

## 2024-02-13 DIAGNOSIS — N1832 Chronic kidney disease, stage 3b: Secondary | ICD-10-CM | POA: Diagnosis not present

## 2024-02-13 DIAGNOSIS — E1122 Type 2 diabetes mellitus with diabetic chronic kidney disease: Secondary | ICD-10-CM | POA: Diagnosis not present

## 2024-02-13 DIAGNOSIS — F3181 Bipolar II disorder: Secondary | ICD-10-CM | POA: Diagnosis not present

## 2024-02-16 DIAGNOSIS — N1832 Chronic kidney disease, stage 3b: Secondary | ICD-10-CM | POA: Diagnosis not present

## 2024-02-16 DIAGNOSIS — L97521 Non-pressure chronic ulcer of other part of left foot limited to breakdown of skin: Secondary | ICD-10-CM | POA: Diagnosis not present

## 2024-02-16 DIAGNOSIS — J189 Pneumonia, unspecified organism: Secondary | ICD-10-CM | POA: Insufficient documentation

## 2024-02-16 DIAGNOSIS — E1122 Type 2 diabetes mellitus with diabetic chronic kidney disease: Secondary | ICD-10-CM | POA: Diagnosis not present

## 2024-02-16 DIAGNOSIS — E11621 Type 2 diabetes mellitus with foot ulcer: Secondary | ICD-10-CM | POA: Diagnosis not present

## 2024-02-16 DIAGNOSIS — I130 Hypertensive heart and chronic kidney disease with heart failure and stage 1 through stage 4 chronic kidney disease, or unspecified chronic kidney disease: Secondary | ICD-10-CM | POA: Diagnosis not present

## 2024-02-16 DIAGNOSIS — I5032 Chronic diastolic (congestive) heart failure: Secondary | ICD-10-CM | POA: Diagnosis not present

## 2024-02-17 DIAGNOSIS — I5032 Chronic diastolic (congestive) heart failure: Secondary | ICD-10-CM | POA: Diagnosis not present

## 2024-02-17 DIAGNOSIS — L97521 Non-pressure chronic ulcer of other part of left foot limited to breakdown of skin: Secondary | ICD-10-CM | POA: Diagnosis not present

## 2024-02-17 DIAGNOSIS — I130 Hypertensive heart and chronic kidney disease with heart failure and stage 1 through stage 4 chronic kidney disease, or unspecified chronic kidney disease: Secondary | ICD-10-CM | POA: Diagnosis not present

## 2024-02-17 DIAGNOSIS — E11621 Type 2 diabetes mellitus with foot ulcer: Secondary | ICD-10-CM | POA: Diagnosis not present

## 2024-02-17 DIAGNOSIS — E1122 Type 2 diabetes mellitus with diabetic chronic kidney disease: Secondary | ICD-10-CM | POA: Diagnosis not present

## 2024-02-17 DIAGNOSIS — N1832 Chronic kidney disease, stage 3b: Secondary | ICD-10-CM | POA: Diagnosis not present

## 2024-02-18 ENCOUNTER — Telehealth: Payer: Self-pay | Admitting: Pharmacy Technician

## 2024-02-18 DIAGNOSIS — L97521 Non-pressure chronic ulcer of other part of left foot limited to breakdown of skin: Secondary | ICD-10-CM | POA: Diagnosis not present

## 2024-02-18 DIAGNOSIS — I5032 Chronic diastolic (congestive) heart failure: Secondary | ICD-10-CM | POA: Diagnosis not present

## 2024-02-18 DIAGNOSIS — N1832 Chronic kidney disease, stage 3b: Secondary | ICD-10-CM | POA: Diagnosis not present

## 2024-02-18 DIAGNOSIS — I130 Hypertensive heart and chronic kidney disease with heart failure and stage 1 through stage 4 chronic kidney disease, or unspecified chronic kidney disease: Secondary | ICD-10-CM | POA: Diagnosis not present

## 2024-02-18 DIAGNOSIS — E1122 Type 2 diabetes mellitus with diabetic chronic kidney disease: Secondary | ICD-10-CM | POA: Diagnosis not present

## 2024-02-18 DIAGNOSIS — E11621 Type 2 diabetes mellitus with foot ulcer: Secondary | ICD-10-CM | POA: Diagnosis not present

## 2024-02-18 NOTE — Progress Notes (Addendum)
   02/18/2024  Patient ID: Norma [name], female   DOB: 1950/10/04, 73 y.o.   MRN: [id number]  Received message from Shereen Gin, [facility] Assistant that patient would like me to return the call.  Unsuccessful outreach to patient. HIPAA compliant voicemail left.   ADDENDUM 02/18/2024 Patient returned the call. HIPAA verified. Patient informs Dr. Lenis discontinued Toujeo  and she needs to know how to cancel shipments from [facility] patient assistance. Provided patient phone number to [employer] to cancel medication shipments. Patient verbalized understanding.  Amiaya Mcneeley, CPhT [facility] Office: [phone] Email: [email or web address]

## 2024-02-19 ENCOUNTER — Ambulatory Visit (HOSPITAL_COMMUNITY)
Admission: RE | Admit: 2024-02-19 | Discharge: 2024-02-19 | Disposition: A | Discharge status: Home / Self Care | Source: Ambulatory Visit | Attending: Surgery | Admitting: Surgery

## 2024-02-19 ENCOUNTER — Telehealth: Payer: Self-pay

## 2024-02-19 DIAGNOSIS — M5416 Radiculopathy, lumbar region: Secondary | ICD-10-CM | POA: Insufficient documentation

## 2024-02-19 DIAGNOSIS — M4187 Other forms of scoliosis, lumbosacral region: Secondary | ICD-10-CM | POA: Diagnosis not present

## 2024-02-19 DIAGNOSIS — Z981 Arthrodesis status: Secondary | ICD-10-CM | POA: Diagnosis not present

## 2024-02-19 DIAGNOSIS — M5136 Other intervertebral disc degeneration, lumbar region with discogenic back pain only: Secondary | ICD-10-CM | POA: Diagnosis not present

## 2024-02-19 NOTE — Telephone Encounter (Signed)
 Left a message requesting pt return call to the office.

## 2024-02-20 DIAGNOSIS — E1122 Type 2 diabetes mellitus with diabetic chronic kidney disease: Secondary | ICD-10-CM | POA: Diagnosis not present

## 2024-02-20 DIAGNOSIS — I130 Hypertensive heart and chronic kidney disease with heart failure and stage 1 through stage 4 chronic kidney disease, or unspecified chronic kidney disease: Secondary | ICD-10-CM | POA: Diagnosis not present

## 2024-02-20 DIAGNOSIS — N1832 Chronic kidney disease, stage 3b: Secondary | ICD-10-CM | POA: Diagnosis not present

## 2024-02-20 DIAGNOSIS — E11621 Type 2 diabetes mellitus with foot ulcer: Secondary | ICD-10-CM | POA: Diagnosis not present

## 2024-02-20 DIAGNOSIS — I5032 Chronic diastolic (congestive) heart failure: Secondary | ICD-10-CM | POA: Diagnosis not present

## 2024-02-20 DIAGNOSIS — L97521 Non-pressure chronic ulcer of other part of left foot limited to breakdown of skin: Secondary | ICD-10-CM | POA: Diagnosis not present

## 2024-02-23 DIAGNOSIS — N1832 Chronic kidney disease, stage 3b: Secondary | ICD-10-CM | POA: Diagnosis not present

## 2024-02-23 DIAGNOSIS — I130 Hypertensive heart and chronic kidney disease with heart failure and stage 1 through stage 4 chronic kidney disease, or unspecified chronic kidney disease: Secondary | ICD-10-CM | POA: Diagnosis not present

## 2024-02-23 DIAGNOSIS — E1122 Type 2 diabetes mellitus with diabetic chronic kidney disease: Secondary | ICD-10-CM | POA: Diagnosis not present

## 2024-02-23 DIAGNOSIS — E11621 Type 2 diabetes mellitus with foot ulcer: Secondary | ICD-10-CM | POA: Diagnosis not present

## 2024-02-23 DIAGNOSIS — L97521 Non-pressure chronic ulcer of other part of left foot limited to breakdown of skin: Secondary | ICD-10-CM | POA: Diagnosis not present

## 2024-02-23 DIAGNOSIS — I5032 Chronic diastolic (congestive) heart failure: Secondary | ICD-10-CM | POA: Diagnosis not present

## 2024-02-24 DIAGNOSIS — E1122 Type 2 diabetes mellitus with diabetic chronic kidney disease: Secondary | ICD-10-CM | POA: Diagnosis not present

## 2024-02-24 DIAGNOSIS — N1832 Chronic kidney disease, stage 3b: Secondary | ICD-10-CM | POA: Diagnosis not present

## 2024-02-24 DIAGNOSIS — I5032 Chronic diastolic (congestive) heart failure: Secondary | ICD-10-CM | POA: Diagnosis not present

## 2024-02-24 DIAGNOSIS — L97521 Non-pressure chronic ulcer of other part of left foot limited to breakdown of skin: Secondary | ICD-10-CM | POA: Diagnosis not present

## 2024-02-24 DIAGNOSIS — E11621 Type 2 diabetes mellitus with foot ulcer: Secondary | ICD-10-CM | POA: Diagnosis not present

## 2024-02-24 DIAGNOSIS — I130 Hypertensive heart and chronic kidney disease with heart failure and stage 1 through stage 4 chronic kidney disease, or unspecified chronic kidney disease: Secondary | ICD-10-CM | POA: Diagnosis not present

## 2024-02-25 DIAGNOSIS — M545 Low back pain, unspecified: Secondary | ICD-10-CM | POA: Diagnosis not present

## 2024-02-25 DIAGNOSIS — L97521 Non-pressure chronic ulcer of other part of left foot limited to breakdown of skin: Secondary | ICD-10-CM | POA: Diagnosis not present

## 2024-02-25 DIAGNOSIS — E782 Mixed hyperlipidemia: Secondary | ICD-10-CM | POA: Diagnosis not present

## 2024-02-25 DIAGNOSIS — N1832 Chronic kidney disease, stage 3b: Secondary | ICD-10-CM | POA: Diagnosis not present

## 2024-02-25 DIAGNOSIS — E1329 Other specified diabetes mellitus with other diabetic kidney complication: Secondary | ICD-10-CM | POA: Diagnosis not present

## 2024-02-25 DIAGNOSIS — L97509 Non-pressure chronic ulcer of other part of unspecified foot with unspecified severity: Secondary | ICD-10-CM | POA: Diagnosis not present

## 2024-02-25 DIAGNOSIS — E11621 Type 2 diabetes mellitus with foot ulcer: Secondary | ICD-10-CM | POA: Diagnosis not present

## 2024-02-25 DIAGNOSIS — I5032 Chronic diastolic (congestive) heart failure: Secondary | ICD-10-CM | POA: Diagnosis not present

## 2024-02-25 DIAGNOSIS — G8929 Other chronic pain: Secondary | ICD-10-CM | POA: Diagnosis not present

## 2024-02-25 DIAGNOSIS — I2510 Atherosclerotic heart disease of native coronary artery without angina pectoris: Secondary | ICD-10-CM | POA: Diagnosis not present

## 2024-02-25 DIAGNOSIS — M1990 Unspecified osteoarthritis, unspecified site: Secondary | ICD-10-CM | POA: Diagnosis not present

## 2024-02-25 DIAGNOSIS — I130 Hypertensive heart and chronic kidney disease with heart failure and stage 1 through stage 4 chronic kidney disease, or unspecified chronic kidney disease: Secondary | ICD-10-CM | POA: Diagnosis not present

## 2024-02-25 DIAGNOSIS — E1122 Type 2 diabetes mellitus with diabetic chronic kidney disease: Secondary | ICD-10-CM | POA: Diagnosis not present

## 2024-02-25 DIAGNOSIS — E039 Hypothyroidism, unspecified: Secondary | ICD-10-CM | POA: Diagnosis not present

## 2024-02-26 DIAGNOSIS — N1832 Chronic kidney disease, stage 3b: Secondary | ICD-10-CM | POA: Diagnosis not present

## 2024-02-26 DIAGNOSIS — E1122 Type 2 diabetes mellitus with diabetic chronic kidney disease: Secondary | ICD-10-CM | POA: Diagnosis not present

## 2024-02-26 DIAGNOSIS — I130 Hypertensive heart and chronic kidney disease with heart failure and stage 1 through stage 4 chronic kidney disease, or unspecified chronic kidney disease: Secondary | ICD-10-CM | POA: Diagnosis not present

## 2024-02-26 DIAGNOSIS — E11621 Type 2 diabetes mellitus with foot ulcer: Secondary | ICD-10-CM | POA: Diagnosis not present

## 2024-02-26 DIAGNOSIS — H26492 Other secondary cataract, left eye: Secondary | ICD-10-CM | POA: Diagnosis not present

## 2024-02-26 DIAGNOSIS — I5032 Chronic diastolic (congestive) heart failure: Secondary | ICD-10-CM | POA: Diagnosis not present

## 2024-02-26 DIAGNOSIS — L97521 Non-pressure chronic ulcer of other part of left foot limited to breakdown of skin: Secondary | ICD-10-CM | POA: Diagnosis not present

## 2024-02-27 DIAGNOSIS — N1832 Chronic kidney disease, stage 3b: Secondary | ICD-10-CM | POA: Diagnosis not present

## 2024-02-27 DIAGNOSIS — I5032 Chronic diastolic (congestive) heart failure: Secondary | ICD-10-CM | POA: Diagnosis not present

## 2024-02-27 DIAGNOSIS — E11621 Type 2 diabetes mellitus with foot ulcer: Secondary | ICD-10-CM | POA: Diagnosis not present

## 2024-02-27 DIAGNOSIS — E1122 Type 2 diabetes mellitus with diabetic chronic kidney disease: Secondary | ICD-10-CM | POA: Diagnosis not present

## 2024-02-27 DIAGNOSIS — I130 Hypertensive heart and chronic kidney disease with heart failure and stage 1 through stage 4 chronic kidney disease, or unspecified chronic kidney disease: Secondary | ICD-10-CM | POA: Diagnosis not present

## 2024-02-27 DIAGNOSIS — L97521 Non-pressure chronic ulcer of other part of left foot limited to breakdown of skin: Secondary | ICD-10-CM | POA: Diagnosis not present

## 2024-03-01 DIAGNOSIS — I130 Hypertensive heart and chronic kidney disease with heart failure and stage 1 through stage 4 chronic kidney disease, or unspecified chronic kidney disease: Secondary | ICD-10-CM | POA: Diagnosis not present

## 2024-03-01 DIAGNOSIS — E11621 Type 2 diabetes mellitus with foot ulcer: Secondary | ICD-10-CM | POA: Diagnosis not present

## 2024-03-01 DIAGNOSIS — E1122 Type 2 diabetes mellitus with diabetic chronic kidney disease: Secondary | ICD-10-CM | POA: Diagnosis not present

## 2024-03-01 DIAGNOSIS — I5032 Chronic diastolic (congestive) heart failure: Secondary | ICD-10-CM | POA: Diagnosis not present

## 2024-03-01 DIAGNOSIS — L97521 Non-pressure chronic ulcer of other part of left foot limited to breakdown of skin: Secondary | ICD-10-CM | POA: Diagnosis not present

## 2024-03-01 DIAGNOSIS — N1832 Chronic kidney disease, stage 3b: Secondary | ICD-10-CM | POA: Diagnosis not present

## 2024-03-03 ENCOUNTER — Ambulatory Visit (HOSPITAL_COMMUNITY): Admitting: Psychiatry

## 2024-03-04 DIAGNOSIS — E1122 Type 2 diabetes mellitus with diabetic chronic kidney disease: Secondary | ICD-10-CM | POA: Diagnosis not present

## 2024-03-04 DIAGNOSIS — I5032 Chronic diastolic (congestive) heart failure: Secondary | ICD-10-CM | POA: Diagnosis not present

## 2024-03-04 DIAGNOSIS — L97521 Non-pressure chronic ulcer of other part of left foot limited to breakdown of skin: Secondary | ICD-10-CM | POA: Diagnosis not present

## 2024-03-04 DIAGNOSIS — I130 Hypertensive heart and chronic kidney disease with heart failure and stage 1 through stage 4 chronic kidney disease, or unspecified chronic kidney disease: Secondary | ICD-10-CM | POA: Diagnosis not present

## 2024-03-04 DIAGNOSIS — E11621 Type 2 diabetes mellitus with foot ulcer: Secondary | ICD-10-CM | POA: Diagnosis not present

## 2024-03-04 DIAGNOSIS — N1832 Chronic kidney disease, stage 3b: Secondary | ICD-10-CM | POA: Diagnosis not present

## 2024-03-05 DIAGNOSIS — L97521 Non-pressure chronic ulcer of other part of left foot limited to breakdown of skin: Secondary | ICD-10-CM | POA: Diagnosis not present

## 2024-03-05 DIAGNOSIS — I130 Hypertensive heart and chronic kidney disease with heart failure and stage 1 through stage 4 chronic kidney disease, or unspecified chronic kidney disease: Secondary | ICD-10-CM | POA: Diagnosis not present

## 2024-03-05 DIAGNOSIS — E1122 Type 2 diabetes mellitus with diabetic chronic kidney disease: Secondary | ICD-10-CM | POA: Diagnosis not present

## 2024-03-05 DIAGNOSIS — E11621 Type 2 diabetes mellitus with foot ulcer: Secondary | ICD-10-CM | POA: Diagnosis not present

## 2024-03-05 DIAGNOSIS — N1832 Chronic kidney disease, stage 3b: Secondary | ICD-10-CM | POA: Diagnosis not present

## 2024-03-05 DIAGNOSIS — I5032 Chronic diastolic (congestive) heart failure: Secondary | ICD-10-CM | POA: Diagnosis not present

## 2024-03-08 ENCOUNTER — Ambulatory Visit (HOSPITAL_COMMUNITY): Admitting: Psychiatry

## 2024-03-09 ENCOUNTER — Telehealth: Payer: Self-pay | Admitting: Pharmacy Technician

## 2024-03-09 DIAGNOSIS — M79604 Pain in right leg: Secondary | ICD-10-CM | POA: Diagnosis not present

## 2024-03-09 DIAGNOSIS — K219 Gastro-esophageal reflux disease without esophagitis: Secondary | ICD-10-CM | POA: Diagnosis not present

## 2024-03-09 DIAGNOSIS — R251 Tremor, unspecified: Secondary | ICD-10-CM | POA: Diagnosis not present

## 2024-03-09 DIAGNOSIS — J309 Allergic rhinitis, unspecified: Secondary | ICD-10-CM | POA: Diagnosis not present

## 2024-03-09 DIAGNOSIS — I10 Essential (primary) hypertension: Secondary | ICD-10-CM | POA: Diagnosis not present

## 2024-03-09 DIAGNOSIS — N1832 Chronic kidney disease, stage 3b: Secondary | ICD-10-CM | POA: Diagnosis not present

## 2024-03-09 DIAGNOSIS — I2510 Atherosclerotic heart disease of native coronary artery without angina pectoris: Secondary | ICD-10-CM | POA: Diagnosis not present

## 2024-03-09 DIAGNOSIS — E782 Mixed hyperlipidemia: Secondary | ICD-10-CM | POA: Diagnosis not present

## 2024-03-09 DIAGNOSIS — I5032 Chronic diastolic (congestive) heart failure: Secondary | ICD-10-CM | POA: Diagnosis not present

## 2024-03-09 DIAGNOSIS — F3181 Bipolar II disorder: Secondary | ICD-10-CM | POA: Diagnosis not present

## 2024-03-09 DIAGNOSIS — E039 Hypothyroidism, unspecified: Secondary | ICD-10-CM | POA: Diagnosis not present

## 2024-03-09 DIAGNOSIS — E1165 Type 2 diabetes mellitus with hyperglycemia: Secondary | ICD-10-CM | POA: Diagnosis not present

## 2024-03-09 NOTE — Progress Notes (Signed)
   03/09/2024  Patient ID: Norma [name], female   DOB: May 07, 1950, 73 y.o.   MRN: [id number]  Received message from [facility] team member that the patient would like me to return the call regarding medication assistance.  Unsuccessful outreach to patient. HIPAA compliant voicemail left for patient to return the call.  Aliviya Schoeller, CPhT [facility] Office: [phone] Email: [email or web address]

## 2024-03-14 DIAGNOSIS — E1140 Type 2 diabetes mellitus with diabetic neuropathy, unspecified: Secondary | ICD-10-CM | POA: Diagnosis not present

## 2024-03-14 DIAGNOSIS — I5032 Chronic diastolic (congestive) heart failure: Secondary | ICD-10-CM | POA: Diagnosis not present

## 2024-03-14 DIAGNOSIS — E782 Mixed hyperlipidemia: Secondary | ICD-10-CM | POA: Diagnosis not present

## 2024-03-14 DIAGNOSIS — F3181 Bipolar II disorder: Secondary | ICD-10-CM | POA: Diagnosis not present

## 2024-03-18 ENCOUNTER — Ambulatory Visit (HOSPITAL_COMMUNITY)
Admission: RE | Admit: 2024-03-18 | Discharge: 2024-03-18 | Disposition: A | Source: Ambulatory Visit | Attending: Internal Medicine | Admitting: Internal Medicine

## 2024-03-18 ENCOUNTER — Encounter: Payer: Self-pay | Admitting: Internal Medicine

## 2024-03-18 ENCOUNTER — Ambulatory Visit: Admitting: Internal Medicine

## 2024-03-18 VITALS — BP 111/65 | HR 69 | Ht 60.0 in | Wt 243.0 lb

## 2024-03-18 DIAGNOSIS — Z6841 Body Mass Index (BMI) 40.0 and over, adult: Secondary | ICD-10-CM | POA: Diagnosis not present

## 2024-03-18 DIAGNOSIS — Z87891 Personal history of nicotine dependence: Secondary | ICD-10-CM

## 2024-03-18 DIAGNOSIS — E1121 Type 2 diabetes mellitus with diabetic nephropathy: Secondary | ICD-10-CM | POA: Diagnosis not present

## 2024-03-18 DIAGNOSIS — D509 Iron deficiency anemia, unspecified: Secondary | ICD-10-CM | POA: Diagnosis not present

## 2024-03-18 DIAGNOSIS — R0609 Other forms of dyspnea: Secondary | ICD-10-CM | POA: Insufficient documentation

## 2024-03-18 DIAGNOSIS — E039 Hypothyroidism, unspecified: Secondary | ICD-10-CM | POA: Diagnosis not present

## 2024-03-18 DIAGNOSIS — I2510 Atherosclerotic heart disease of native coronary artery without angina pectoris: Secondary | ICD-10-CM | POA: Diagnosis not present

## 2024-03-18 DIAGNOSIS — R2689 Other abnormalities of gait and mobility: Secondary | ICD-10-CM | POA: Diagnosis not present

## 2024-03-18 NOTE — Patient Instructions (Signed)
 Pace yourself slower if needed to keep from losing your breath  Please remember to go to the lab department   for your tests - we will call you with the results when they are available.  Make sure you check your oxygen  saturation at your highest level of activity(NOT after you stop)  to be sure it stays over 90% and keep track of it at least once a week, more often if breathing getting worse, and let me know if losing ground. (Collect the dots to connect the dots approach)        Please remember to go to the  x-ray department  @  [facility] for your tests - we will call you with the results when they are available      I will call when I have a chance to review all your records to see if you need follow up here.

## 2024-03-18 NOTE — Assessment & Plan Note (Addendum)
 Body mass index is 47.46 kg/m.   Lab Results  Component Value Date   TSH 2.970 03/18/2024   Contributing to doe and risk of GERD/dvt/ PE  >>>   reviewed the need and the process to achieve and maintain neg calorie balance > defer f/u primary care including intermittently monitoring thyroid  status         Each maintenance medication was reviewed in detail including emphasizing most importantly the difference between maintenance and prns and under what circumstances the prns are to be triggered using an action plan format where appropriate.  Total time for H and P, chart review, counseling, reviewing pulse ox  device(s) and generating customized AVS unique to this office visit / same day charting = 50 min with pt new to me  with   refractory respiratory  symptoms of uncertain etiology

## 2024-03-18 NOTE — Progress Notes (Signed)
 Norma [name], female    DOB: 1950-09-17    MRN: [id number]   Brief patient profile:  73  yowf  minimal smoking as teenage  former Norma [name]  Pt self-referred back to pulmonary clinic in [place]  03/18/2024  for  doe x around 2012   attributed to Obesity  complicated by OSA on cpap and 02 just at hs with pfts showing obesity changes only (low ERV) most recently  1//22/2020 at wt 260  S/p admit to [facility] > HP with pna  > SNF > home and not back to baseline as of 03/18/2024   CT chest  12/04/23  1. Dense confluent consolidations in the dependent lungs with air bronchograms, suspect a component of pneumonia. Marked atelectasis can have a similar appearance   2. Additional diffuse interstitial infiltrates, likely pulmonary edema   Admit Date: 12/04/2023 Attending Physician: Charlie Shown Burman Raddle., * Discharge Date: 12/10/2023  Active & Resolved Diagnosis: Principal Problem (Resolved): Acute hypoxic respiratory failure (CMD) Active Problems: Chronic obstructive pulmonary disease with acute lower respiratory infection (CMD) Type 2 diabetes mellitus with diabetic neuropathy, with long-term current use of insulin  (CMD) H/O leukocytosis OSA (obstructive sleep apnea) Resolved Problems: CAP (community acquired pneumonia)  Hospital Course: Norma [name] is a 73 y.o. female with a PMHx significant for CAD s/p CABG (May 2019), HFpEF, CKD3b, OSA on CPAP, COPD,T2DM, Hx L parietal stroke, hypothyroidism, bipolar disorder, and anxiety/depression who was transferred to [facility] ICU from OSH [facility]) on 12/04/23 with AECOPD due to CAP with acute hypoxic respiratory failure requiring intubation. She was extubated to [place] on 8/22 and eventually weaned to RA. Pt was transferred to [facility] Medicine floor on 8/23 and was stable on RA at time of transfer.  Assessment & Plan Acute hypoxic respiratory failure (CMD) (Resolved: 12/10/2023) Presented from OSH 8/21 intubated. She presented to OSH with respiratory  distress, found to have pneumonia, acute hypoxic respiratory failure and required intubation. She was transferred to ICU at [facility]. She was extubated on 8/22. Transferred to medicine service on 8/23.  - Patient is not on inhalers at home. Patient has remained stable, saturating well on room air. At the time of discharge, the patient was afebrile, vital signs were stable, in no acute distress. The patient meets all goals necessary for discharge from a medical and therapy standpoint, and thus the patient is stable and safe for discharge. New discharge medications discussed in detail and the patient stated understanding of use and administration. The patient is verbalizing understanding of all discharge instructions and therefore was released.  PT/OT recommended SNF, and patient is being discharged to SNF.  CAP (community acquired pneumonia) (Resolved: 12/10/2023) Sepsis due to pneumonia, resolved Lactic acidosis. Resolved. The patient has evidence of ongoing pneumonia based on images showing consolidation with WBC/mL greater than or equal to 12,000 and 2 or more additional findings of new onset or worsening cough, or dyspnea, or tachycardia and Increased oxygen  requirements .  CT at OSH showed dense confluent consolidation in the dependent lungs with air bronchogram's, suspect component of pneumonia. Sputum cultures positive for MSSA and streptococcus, She had leukocytosis of 32k and Lactic acid of 3.2.  - Patient completed 3 days of azithromycin  and 5 days of ceftriaxone  for CAP - No blood cultures were obtained at OSH. She has remained afebrile and HDS.   Type 2 diabetes mellitus with diabetic neuropathy, with long-term current use of insulin  (CMD) T2DM Lab Results  Component Value Date  HGBA1C 7.2 (H) 12/08/2023  Patient is on empagliflozin , insulin  glargine 50 units nightly, semaglutide  at home Patient reports low serum glucose levels at home on the above regimen. Patient has been requiring around  5 units total insulin  lispro daily here. - Will discharge on home empagliflozin  and semaglutide , and 5 units insulin  glargine nightly. - Recommend POC glucose checks with meals and at night, and titrate insulin  glargine if indicated  H/O leukocytosis Appears chronic. Baseline ~ 13-15 Stable. Pt afebrile and HDS. CAP treated as above.  OSA (obstructive sleep apnea) - continue home CPAP   History of Present Illness  03/18/2024  Pulmonary/ TOC  office eval/ [facility] / [place] [facility] wt 243 no resp rx daytime Chief Complaint  Patient presents with   Shortness of Breath    TOC - shob all the time  Dyspnea:  room to room at home / never shops anymore / still goes to church let off at door and 1st pew sits  > variably tol s stopping  Cough: occ but not productive and no correlation with doe  Sleep: flat bed/  2 pillows and cpap / 2lpm  q hs  SABA use: Vonzell said they won't help and he was right  02 ldz:wnwz daytime     No obvious patterns in  day to day or daytime pattern/variability or assoc excess/ purulent sputum or mucus plugs or hemoptysis or cp or chest tightness, subjective wheeze or overt sinus or hb symptoms.    Also denies any obvious fluctuation of symptoms with weather or environmental changes or other aggravating or alleviating factors except as outlined above   No unusual exposure hx or h/o childhood pna/ asthma or knowledge of premature birth.  Current Allergies, Complete Past Medical History, Past Surgical History, Family History, and Social History were reviewed in [employer] record.  ROS  The following are not active complaints unless bolded Hoarseness, sore throat, dysphagia, dental problems, itching, sneezing,  nasal congestion or discharge of excess mucus or purulent secretions, ear ache,   fever, chills, sweats, unintended wt loss or wt gain, classically pleuritic or exertional cp,  orthopnea pnd or arm/hand swelling  or leg swelling,  presyncope, palpitations, abdominal pain, anorexia, nausea, vomiting, diarrhea  or change in bowel habits or change in bladder habits, change in stools or change in urine, dysuria, hematuria,  rash, arthralgias, visual complaints, headache, numbness, weakness or ataxia or problems with walking/uses rollator or coordination,  change in mood or  memory.            Outpatient Medications Prior to Visit  Medication Sig Dispense Refill   acetaminophen  (TYLENOL ) 500 MG tablet Take 1,000 mg by mouth daily as needed for moderate pain (pain score 4-6), fever or headache.     ARIPiprazole  (ABILIFY ) 5 MG tablet Take 1 tablet (5 mg total) by mouth daily. 30 tablet 2   aspirin  EC 81 MG tablet Take 1 tablet (81 mg total) by mouth daily. 90 tablet 3   azelastine (ASTELIN) 0.1 % nasal spray Place 1 spray into both nostrils 2 (two) times daily as needed for allergies.     buPROPion  (WELLBUTRIN  XL) 150 MG 24 hr tablet Take 1 tablet (150 mg total) by mouth every morning. 30 tablet 2   cetirizine  (ZYRTEC ) 10 MG chewable tablet Chew 10 mg by mouth daily.     diclofenac Sodium (VOLTAREN) 1 % GEL Apply 2 g topically 4 (four) times daily as needed (pain).     EASY TOUCH PEN NEEDLES 31G X 6  MM MISC USE AS DIRECTED with Toujeo  (she wants 6mm) 100 each 2   EPINEPHrine  0.3 mg/0.3 mL IJ SOAJ injection Inject 0.3 mg into the muscle as needed.     FT VITAMIN D3 50 MCG TABS TAKE 1 TABLET BY MOUTH EVERY MORNING 90 tablet 3   gabapentin  (NEURONTIN ) 400 MG capsule Take 400-800 mg by mouth See admin instructions. Take 1 capsule (400mg ) every morning and 2 capsules (800mg ) at bedtime.     JARDIANCE  10 MG TABS tablet TAKE 1 TABLET (10 MG TOTAL) BY MOUTH DAILY WITH BREAKFAST. 90 tablet 2   lamoTRIgine  (LAMICTAL ) 150 MG tablet Take 1 tablet (150 mg total) by mouth at bedtime. 30 tablet 2   levothyroxine  (SYNTHROID ) 112 MCG tablet Take 112 mcg by mouth daily.     linaclotide (LINZESS) 145 MCG CAPS capsule Take 145 mcg by mouth daily  before breakfast.     losartan  (COZAAR ) 25 MG tablet Take 25 mg by mouth daily.     pantoprazole  (PROTONIX ) 40 MG tablet TAKE 1 TABLET BY MOUTH AT BEDTIME 90 tablet 2   propranolol  (INDERAL ) 40 MG tablet Take 1 tablet (40 mg total) by mouth 2 (two) times daily. 60 tablet 6   REPATHA  SURECLICK 140 MG/ML SOAJ ADMINISTER 1 ML UNDER THE SKIN EVERY 14 DAYS 6 mL 1   rOPINIRole  (REQUIP ) 3 MG tablet TAKE ONE TABLET BY MOUTH EVERY MORNING, ,NOON AND AT BEDTIME. (PRT PT MORNING,EVENING,BEDTIME) (Patient taking differently: Take 3-6 mg by mouth See admin instructions. Take 1 tablet (3mg ) every morning and 2 tablets (6mg ) at bedtime.) 270 tablet 3   Semaglutide , 2 MG/DOSE, 8 MG/3ML SOPN Inject 2 mg as directed once a week. 9 mL 1   tiZANidine  (ZANAFLEX ) 2 MG tablet Take 2 mg by mouth 2 (two) times daily.     triamcinolone  cream (KENALOG) 0.1 % SMARTSIG:sparingly Topical Twice Daily     venlafaxine  XR (EFFEXOR -XR) 150 MG 24 hr capsule TAKE TWO (2) CAPSULES BY MOUTH AT BEDTIME 60 capsule 2   No facility-administered medications prior to visit.    Past Medical History:  Diagnosis Date   Anemia    Anxiety    Bipolar disorder (HCC)    Bulging lumbar disc    L3-4   Chronic daily headache    Chronic low back pain 09/20/2014   CKD (chronic kidney disease)    stage 3   COPD (chronic obstructive pulmonary disease) (HCC)    Coronary artery disease    CABG 2019   Degenerative arthritis    Depression    Diabetes mellitus, type II (HCC)    Diabetic neuropathy (HCC) 09/16/2018   DM type 2 with diabetic peripheral neuropathy (HCC) 09/20/2014   Dyslipidemia    Dyspnea    Family history of adverse reaction to anesthesia    father had reaction to anesthesia medication per patient they don't use it anymore- unsure of reaction   Fatty liver    per pt report   Gastroparesis    GERD (gastroesophageal reflux disease)    Heart murmur    History of hiatal hernia    pt states she no longer has this    Hypertension    Hypothyroidism    IBS (irritable bowel syndrome)    LBBB (left bundle branch block)    Memory difficulty 09/20/2014   Morbid obesity (HCC)    Myocardial infarction (HCC)    per pt- prior to CABG   Neuropathy    Obstructive sleep apnea on CPAP  Renal insufficiency    Restless legs syndrome (RLS) 09/17/2012   Stroke (cerebrum) (HCC) 05/16/2017   Left parietal   Urticaria       Objective:     BP 111/65   Pulse 69   Ht 5' (1.524 m)   Wt 243 lb (110.2 kg)   SpO2 93% Comment: ra  BMI 47.46 kg/m   SpO2: 93 % (ra) amb MO (by BMI)  nad / uses rollator   HEENT : Oropharynx  clear      Nasal turbinates nl    NECK :  without  apparent JVD/ palpable Nodes/TM    LUNGS: no acc muscle use,  mildly kyphotic  contour chest which is clear to A and P bilaterally without cough on insp or exp maneuvers   CV:  RRR  no s3 or murmur or increase in P2, and no edema   ABD:  quit obese soft and nontender   MS:  ext warm without deformities Or obvious joint restrictions  calf tenderness, cyanosis or clubbing    SKIN: warm and dry without lesions    NEURO:  alert, approp, nl sensorium with  no motor or cerebellar deficits apparent.      CXR PA and Lateral:   03/18/2024 :    I personally reviewed images and impression is as follows:     Relatively small lung volumes / very mild kyphosis/ no acute findings    Labs ordered/ reviewed:      Chemistry      Component Value Date/Time   NA 140 03/18/2024 1149   K 4.8 03/18/2024 1149   CL 100 03/18/2024 1149   CO2 24 03/18/2024 1149   BUN 19 03/18/2024 1149   CREATININE 1.26 (H) 03/18/2024 1149   CREATININE 1.10 (H) 03/05/2019 1221   GLU 203 04/22/2023 0000      Component Value Date/Time   CALCIUM  9.5 03/18/2024 1149   ALKPHOS 81 04/22/2023 0000   AST 11 (A) 04/22/2023 0000   ALT 12 04/22/2023 0000   BILITOT 0.7 04/13/2023 1846   BILITOT 0.2 01/11/2021 1014        Lab Results  Component Value Date   WBC  11.6 (H) 03/18/2024   HGB 13.3 03/18/2024   HCT 41.9 03/18/2024   MCV 97 03/18/2024   PLT 335 03/18/2024     Lab Results  Component Value Date   DDIMER 0.82 (H) 03/18/2024      Lab Results  Component Value Date   TSH 2.970 03/18/2024     BNP      96      03/18/2024     Lab Results  Component Value Date   ESRSEDRATE 33 03/18/2024   ESRSEDRATE 35 (H) 04/13/2023   ESRSEDRATE 18 04/23/2019        Assessment      Assessment & Plan DOE (dyspnea on exertion) Minimal smoking hx - PFTs thru 05/06/18 wnl x low ERV c/w effects of obesity - 03/18/2024   Walked on RA  x  2  lap(s) =  approx 300  ft  @ slow/ rollator pace, stopped due to sob  with lowest 02 sats 96% - DOE labs 03/18/2024  ok  Symptoms are   disproportionate to objective findings and not clear to what extent this is actually a pulmonary  problem but pt does appear to have difficult to sort out respiratory symptoms of unknown origin for which  DDX  = almost all start with A and  include  Adherence, Ace Inhibitors, Acid Reflux, Active Sinus Disease, Alpha 1 Antitripsin deficiency, Anxiety masquerading as Airways dz,  ABPA,  Allergy(esp in young), Aspiration (esp in elderly), Adverse effects of meds,  Active smoking or Vaping, A bunch of PE's/clot burden (a few small clots can't cause this syndrome unless there is already severe underlying pulm or vascular dz with poor reserve),  Anemia or thyroid  disorder, plus two Bs  = Bronchiectasis and Beta blocker use..and one C= CHF    All items under ddx considered and most eliminated by hx / exam and walking study today except:  ? A bunch of PEs :  D dimer   high normal value (seen commonly in the elderly or chronically ill)  may miss small peripheral pe, the clot burden with sob is moderately high and the d dimer  has a very high neg pred value if used in this setting.    Anxiety / depression/ deconditioning can't be excluded on basis of today's eval so rec regular submax ex and check  sats a peak ex and f/u prn if losing ground but I don't see a specific pulmonary dx that requires follow up in this clinic at this point.    Morbid obesity due to excess calories (HCC) Body mass index is 47.46 kg/m.   Lab Results  Component Value Date   TSH 2.970 03/18/2024   Contributing to doe and risk of GERD/dvt/ PE  >>>   reviewed the need and the process to achieve and maintain neg calorie balance > defer f/u primary care including intermittently monitoring thyroid  status         Each maintenance medication was reviewed in detail including emphasizing most importantly the difference between maintenance and prns and under what circumstances the prns are to be triggered using an action plan format where appropriate.  Total time for H and P, chart review, counseling, reviewing pulse ox  device(s) and generating customized AVS unique to this office visit / same day charting = 50 min with pt new to me  with   refractory respiratory  symptoms of uncertain etiology          AVS  Patient Instructions  Pace yourself slower if needed to keep from losing your breath  Please remember to go to the lab department   for your tests - we will call you with the results when they are available.  Make sure you check your oxygen  saturation at your highest level of activity(NOT after you stop)  to be sure it stays over 90% and keep track of it at least once a week, more often if breathing getting worse, and let me know if losing ground. (Collect the dots to connect the dots approach)        Please remember to go to the  x-ray department  @  [facility] for your tests - we will call you with the results when they are available      I will call when I have a chance to review all your records to see if you need follow up here.    Ozell America, MD 03/21/2024

## 2024-03-18 NOTE — Assessment & Plan Note (Addendum)
 Minimal smoking hx - PFTs thru 05/06/18 wnl x low ERV c/w effects of obesity - 03/18/2024   Walked on RA  x  2  lap(s) =  approx 300  ft  @ slow/ rollator pace, stopped due to sob  with lowest 02 sats 96% - DOE labs 03/18/2024  ok  Symptoms are   disproportionate to objective findings and not clear to what extent this is actually a pulmonary  problem but pt does appear to have difficult to sort out respiratory symptoms of unknown origin for which  DDX  = almost all start with A and  include Adherence, Ace Inhibitors, Acid Reflux, Active Sinus Disease, Alpha 1 Antitripsin deficiency, Anxiety masquerading as Airways dz,  ABPA,  Allergy(esp in young), Aspiration (esp in elderly), Adverse effects of meds,  Active smoking or Vaping, A bunch of PE's/clot burden (a few small clots can't cause this syndrome unless there is already severe underlying pulm or vascular dz with poor reserve),  Anemia or thyroid  disorder, plus two Bs  = Bronchiectasis and Beta blocker use..and one C= CHF    All items under ddx considered and most eliminated by hx / exam and walking study today except:  ? A bunch of PEs :  D dimer   high normal value (seen commonly in the elderly or chronically ill)  may miss small peripheral pe, the clot burden with sob is moderately high and the d dimer  has a very high neg pred value if used in this setting.    Anxiety / depression/ deconditioning can't be excluded on basis of today's eval so rec regular submax ex and check sats a peak ex and f/u prn if losing ground but I don't see a specific pulmonary dx that requires follow up in this clinic at this point.

## 2024-03-21 ENCOUNTER — Ambulatory Visit: Payer: Self-pay | Admitting: Internal Medicine

## 2024-03-21 LAB — CBC WITH DIFFERENTIAL/PLATELET
Basophils Absolute: 0.1 x10E3/uL (ref 0.0–0.2)
Basos: 1 %
EOS (ABSOLUTE): 0.4 x10E3/uL (ref 0.0–0.4)
Eos: 4 %
Hematocrit: 41.9 % (ref 34.0–46.6)
Hemoglobin: 13.3 g/dL (ref 11.1–15.9)
Immature Grans (Abs): 0.1 x10E3/uL (ref 0.0–0.1)
Immature Granulocytes: 1 %
Lymphocytes Absolute: 1.4 x10E3/uL (ref 0.7–3.1)
Lymphs: 12 %
MCH: 30.8 pg (ref 26.6–33.0)
MCHC: 31.7 g/dL (ref 31.5–35.7)
MCV: 97 fL (ref 79–97)
Monocytes Absolute: 0.7 x10E3/uL (ref 0.1–0.9)
Monocytes: 6 %
Neutrophils Absolute: 9 x10E3/uL — ABNORMAL HIGH (ref 1.4–7.0)
Neutrophils: 76 %
Platelets: 335 x10E3/uL (ref 150–450)
RBC: 4.32 x10E6/uL (ref 3.77–5.28)
RDW: 12.8 % (ref 11.7–15.4)
WBC: 11.6 x10E3/uL — ABNORMAL HIGH (ref 3.4–10.8)

## 2024-03-21 LAB — TSH: TSH: 2.97 u[IU]/mL (ref 0.450–4.500)

## 2024-03-21 LAB — BASIC METABOLIC PANEL WITH GFR
BUN/Creatinine Ratio: 15 (ref 12–28)
BUN: 19 mg/dL (ref 8–27)
CO2: 24 mmol/L (ref 20–29)
Calcium: 9.5 mg/dL (ref 8.7–10.3)
Chloride: 100 mmol/L (ref 96–106)
Creatinine, Ser: 1.26 mg/dL — ABNORMAL HIGH (ref 0.57–1.00)
Glucose: 144 mg/dL — ABNORMAL HIGH (ref 70–99)
Potassium: 4.8 mmol/L (ref 3.5–5.2)
Sodium: 140 mmol/L (ref 134–144)
eGFR: 46 mL/min/1.73 — ABNORMAL LOW (ref >59)

## 2024-03-21 LAB — IGE: IgE (Immunoglobulin E), Serum: 58 [IU]/mL (ref 6–495)

## 2024-03-21 LAB — BRAIN NATRIURETIC PEPTIDE: BNP: 95.8 pg/mL (ref 0.0–100.0)

## 2024-03-21 LAB — SEDIMENTATION RATE: Sed Rate: 33 mm/h (ref 0–40)

## 2024-03-21 LAB — D-DIMER, QUANTITATIVE: D-DIMER: 0.82 mg{FEU}/L — ABNORMAL HIGH (ref 0.00–0.49)

## 2024-03-22 NOTE — Progress Notes (Signed)
 Atc vmfull

## 2024-03-23 NOTE — Progress Notes (Signed)
 Atc x1 lmtcb

## 2024-03-24 ENCOUNTER — Other Ambulatory Visit: Payer: Self-pay

## 2024-03-24 ENCOUNTER — Ambulatory Visit (INDEPENDENT_AMBULATORY_CARE_PROVIDER_SITE_OTHER): Admitting: Psychiatry

## 2024-03-24 ENCOUNTER — Encounter (HOSPITAL_COMMUNITY): Payer: Self-pay | Admitting: Psychiatry

## 2024-03-24 VITALS — BP 108/60 | HR 61 | Ht 60.0 in | Wt 241.4 lb

## 2024-03-24 DIAGNOSIS — F3131 Bipolar disorder, current episode depressed, mild: Secondary | ICD-10-CM

## 2024-03-24 DIAGNOSIS — F331 Major depressive disorder, recurrent, moderate: Secondary | ICD-10-CM

## 2024-03-24 MED ORDER — VENLAFAXINE HCL ER 150 MG PO CP24: ORAL_CAPSULE | ORAL | 2 refills | Status: AC

## 2024-03-24 MED ORDER — ARIPIPRAZOLE 5 MG PO TABS: 5.0000 mg | ORAL_TABLET | Freq: Every day | ORAL | 2 refills | Status: AC

## 2024-03-24 MED ORDER — BUPROPION HCL ER (XL) 150 MG PO TB24: 150.0000 mg | ORAL_TABLET | Freq: Every morning | ORAL | 2 refills | Status: AC

## 2024-03-24 MED ORDER — LAMOTRIGINE 150 MG PO TABS: 150.0000 mg | ORAL_TABLET | Freq: Every day | ORAL | 2 refills | Status: AC

## 2024-03-24 NOTE — Progress Notes (Signed)
 [facility] MD/PA/NP OP Progress Note  03/24/2024 11:17 AM Norma [name]  MRN:  [id number]  Chief Complaint:  Chief Complaint  Patient presents with   Depression   Anxiety   Follow-up   HPI: This patient is a 73 year old white female who is living with a roommate in [place]. 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   The patient returns for follow-up after 4 months regarding her bipolar disorder and major depression.  She presents in person today.  Since I last saw the patient has had some medical issues.  She was hospitalized in August due to an exacerbation of COPD.  She is being followed by pulmonology.  All of her lab work is generally been normal although her kidney functions are a bit elevated.  She is also followed by nephrology.  Right now she states that her mood is stable.  She denies significant depression or mood swings.  She is sleeping well.  She gets along well with her roommate.  She spends time with her sister and her father.  She is still walking with a walker as she was very weak when she came out of the hospital.  She has had PT and OT.  She is pleasant and easy to talk with today. Visit Diagnosis:    ICD-10-CM   1. Bipolar affective disorder, currently depressed, mild (HCC)  F31.31     2. Moderate episode of recurrent major depressive disorder (HCC)  F33.1       Past Psychiatric History: She was hospitalized in 2001 and went through a course of ECT.  She has been tried on Serzone Effexor  Wellbutrin  and Zyprexa   Past Medical History:  Past Medical History:  Diagnosis Date   Anemia    Anxiety    Bipolar disorder (HCC)    Bulging lumbar disc    L3-4   Chronic daily headache    Chronic low back pain 09/20/2014   CKD (chronic kidney disease)    stage 3   COPD (chronic obstructive pulmonary disease) (HCC)    Coronary artery disease    CABG 2019   Degenerative arthritis    Depression    Diabetes mellitus, type II (HCC)    Diabetic neuropathy (HCC) 09/16/2018    DM type 2 with diabetic peripheral neuropathy (HCC) 09/20/2014   Dyslipidemia    Dyspnea    Family history of adverse reaction to anesthesia    father had reaction to anesthesia medication per patient they don't use it anymore- unsure of reaction   Fatty liver    per pt report   Gastroparesis    GERD (gastroesophageal reflux disease)    Heart murmur    History of hiatal hernia    pt states she no longer has this   Hypertension    Hypothyroidism    IBS (irritable bowel syndrome)    LBBB (left bundle branch block)    Memory difficulty 09/20/2014   Morbid obesity (HCC)    Myocardial infarction (HCC)    per pt- prior to CABG   Neuropathy    Obstructive sleep apnea on CPAP    Renal insufficiency    Restless legs syndrome (RLS) 09/17/2012   Stroke (cerebrum) (HCC) 05/16/2017   Left parietal   Urticaria     Past Surgical History:  Procedure Laterality Date   ABDOMINAL HYSTERECTOMY     ARTHROSCOPY KNEE W/ DRILLING  06/2011   and Decemer of 2013.   Carpel tunnel  1980's  CATARACT EXTRACTION W/PHACO Right 03/21/2017   Procedure: CATARACT EXTRACTION PHACO AND INTRAOCULAR LENS PLACEMENT RIGHT EYE;  Surgeon: Harrie Agent, MD;  Location: [facility];  Service: Ophthalmology;  Laterality: Right;  CDE: 2.91    CATARACT EXTRACTION W/PHACO Left 04/18/2017   Procedure: CATARACT EXTRACTION PHACO AND INTRAOCULAR LENS PLACEMENT LEFT EYE;  Surgeon: Harrie Agent, MD;  Location: [facility];  Service: Ophthalmology;  Laterality: Left;  left   CHOLECYSTECTOMY     COLONOSCOPY WITH PROPOFOL  N/A 10/04/2016   Procedure: COLONOSCOPY WITH PROPOFOL ;  Surgeon: Golda Claudis PENNER, MD;  Location: [facility];  Service: Endoscopy;  Laterality: N/A;  11:10   CORONARY ARTERY BYPASS GRAFT N/A 08/29/2017   Procedure: CORONARY ARTERY BYPASS GRAFTING (CABG) x 3 WITH ENDOSCOPIC HARVESTING OF RIGHT SAPHENOUS VEIN;  Surgeon: Fleeta Hanford Coy, MD;  Location: [facility] OR;  Service: Open Heart Surgery;  Laterality: N/A;    ESOPHAGOGASTRODUODENOSCOPY N/A 04/25/2016   Procedure: ESOPHAGOGASTRODUODENOSCOPY (EGD);  Surgeon: Claudis PENNER Golda, MD;  Location: [facility];  Service: Endoscopy;  Laterality: N/A;  730   ESOPHAGOGASTRODUODENOSCOPY  03/2023   LEFT HEART CATH AND CORONARY ANGIOGRAPHY N/A 08/27/2017   Procedure: LEFT HEART CATH AND CORONARY ANGIOGRAPHY;  Surgeon: Wonda Sharper, MD;  Location: [facility] INVASIVE CV LAB;  Service: Cardiovascular::  pLAD 95% - p-mLAD 50%, ostD1 90% -pD1 80%; ostOM1 90%; rPDA 80%. EF ~50-55% - HK of dital Anterolateral & Apical wall.  - Rec CVTS c/s   NECK SURGERY     pt reports having growth removed from the back of her neck- after 2021   OPEN REDUCTION INTERNAL FIXATION (ORIF) HAND Left 04/10/2020   Procedure: OPEN REDUCTION INTERNAL FIXATION (ORIF) HAND, left index finger;  Surgeon: Margrette Taft BRAVO, MD;  Location: [facility];  Service: Orthopedics;  Laterality: Left;  0.045 k wires   RECTAL SURGERY     fissure   SHOULDER SURGERY Left    arthroscopy in March of this year   TEE WITHOUT CARDIOVERSION N/A 08/29/2017   Procedure: TRANSESOPHAGEAL ECHOCARDIOGRAM (TEE);  Surgeon: Fleeta Hanford, Coy, MD;  Location: [facility] OR;  Service: Open Heart Surgery;  Laterality: N/A;   TRANSFORAMINAL LUMBAR INTERBODY FUSION W/ MIS 1 LEVEL Right 07/12/2021   Procedure: Minimally Invasive Surgery Transforaminal Lumbar Interbody Fusion  Lumbar four-five;  Surgeon: Debby Dorn MATSU, MD;  Location: [facility] OR;  Service: Neurosurgery;  Laterality: Right;   TRANSTHORACIC ECHOCARDIOGRAM  06/06/2017   Mild to moderate reduced EF 40 and 45%.  Anterior septal, inferoseptal and basal to mid inferior hypokinesis.  GR 1 DD.  No significant valvular lesion    Family Psychiatric History: See below  Family History:  Family History  Problem Relation Age of Onset   Hypertension Mother    Lymphoma Mother    Depression Mother    Arthritis Father    Alcohol abuse Father    Hypertension Sister    Cancer Brother         kidney and lung   Alcohol abuse Brother    Alcohol abuse Paternal Uncle    Alcohol abuse Paternal Grandfather    Alcohol abuse Paternal Grandmother    Allergic rhinitis Neg Hx    Angioedema Neg Hx    Asthma Neg Hx    Atopy Neg Hx    Eczema Neg Hx    Immunodeficiency Neg Hx    Urticaria Neg Hx     Social History:  Social History   Socioeconomic History   Marital status: Single    Spouse name:  Not on file   Number of children: 0   Years of education: 16   Highest education level: Not on file  Occupational History    Employer: [employer]  Tobacco Use   Smoking status: Former    Current packs/day: 0.00    Types: Cigarettes    Quit date: 02/04/1971    Years since quitting: 53.1   Smokeless tobacco: Never   Tobacco comments:    smoked 2 cigarettes a day  Vaping Use   Vaping status: Never Used  Substance and Sexual Activity   Alcohol use: No    Alcohol/week: 0.0 standard drinks of alcohol   Drug use: No   Sexual activity: Never  Other Topics Concern   Not on file  Social History Narrative   Patient lives at home alone.    Patient has no children.    Patient has her masters in nursing.    Patient is single.    Patient drinks about 2 glasses of tea daily.   Patient is right handed.   Social Drivers of Health   Financial Resource Strain: Medium Risk (12/18/2021)   Overall Financial Resource Strain (CARDIA)    Difficulty of Paying Living Expenses: Somewhat hard  Food Insecurity: No Food Insecurity (04/13/2023)   Hunger Vital Sign    Worried About Running Out of Food in the Last Year: Never true    Ran Out of Food in the Last Year: Never true  Transportation Needs: No Transportation Needs (12/18/2023)   [facility] from [facility] - Transportation    Lack of Transportation (Medical): No    Lack of Transportation (Non-Medical): No  Physical Activity: Not on file  Stress: No Stress Concern Present (10/04/2021)   [employer]  [employer] - Occupational Stress Questionnaire    Feeling of Stress : Only a little  Social Connections: Not on file    Allergies:  Allergies  Allergen Reactions   Amoxil [Amoxicillin] Anaphylaxis   Hydrocodone Anaphylaxis   Percocet [Oxycodone -Acetaminophen ] Other (See Comments)    Causes chest pain /tightness. Pressure pain around her ribs.   Depacon [Valproic Acid] Other (See Comments)    Causes falls    Prednisone  Itching   Roxicodone  [Oxycodone ] Other (See Comments)    Chest pain, tightness    Metabolic Disorder Labs: Lab Results  Component Value Date   HGBA1C 7.7 04/22/2023   MPG 165.68 07/05/2021   MPG 148 03/05/2019   No results found for: PROLACTIN Lab Results  Component Value Date   CHOL 173 04/22/2023   TRIG 147 04/22/2023   HDL 41 04/22/2023   CHOLHDL 3.8 03/05/2019   VLDL 31 (H) 07/10/2016   LDLCALC 106 04/22/2023   LDLCALC 62 10/09/2022   Lab Results  Component Value Date   TSH 2.970 03/18/2024   TSH 1.79 04/23/2023    Therapeutic Level Labs: No results found for: LITHIUM No results found for: VALPROATE No results found for: CBMZ  Current Medications: Current Outpatient Medications  Medication Sig Dispense Refill   acetaminophen  (TYLENOL ) 500 MG tablet Take 1,000 mg by mouth daily as needed for moderate pain (pain score 4-6), fever or headache.     aspirin  EC 81 MG tablet Take 1 tablet (81 mg total) by mouth daily. 90 tablet 3   azelastine (ASTELIN) 0.1 % nasal spray Place 1 spray into both nostrils 2 (two) times daily as needed for allergies.     cetirizine  (ZYRTEC ) 10 MG chewable tablet Chew 10 mg  by mouth daily.     diclofenac Sodium (VOLTAREN) 1 % GEL Apply 2 g topically 4 (four) times daily as needed (pain).     EASY TOUCH PEN NEEDLES 31G X 6 MM MISC USE AS DIRECTED with Toujeo  (she wants 6mm) 100 each 2   EPINEPHrine  0.3 mg/0.3 mL IJ SOAJ injection Inject 0.3 mg into the muscle as needed.     FT VITAMIN D3 50 MCG TABS TAKE 1 TABLET BY  MOUTH EVERY MORNING 90 tablet 3   gabapentin  (NEURONTIN ) 400 MG capsule Take 400-800 mg by mouth See admin instructions. Take 1 capsule (400mg ) every morning and 2 capsules (800mg ) at bedtime.     JARDIANCE  10 MG TABS tablet TAKE 1 TABLET (10 MG TOTAL) BY MOUTH DAILY WITH BREAKFAST. 90 tablet 2   levothyroxine  (SYNTHROID ) 112 MCG tablet Take 112 mcg by mouth daily.     linaclotide (LINZESS) 145 MCG CAPS capsule Take 145 mcg by mouth daily before breakfast.     losartan  (COZAAR ) 25 MG tablet Take 25 mg by mouth daily.     pantoprazole  (PROTONIX ) 40 MG tablet TAKE 1 TABLET BY MOUTH AT BEDTIME 90 tablet 2   propranolol  (INDERAL ) 40 MG tablet Take 1 tablet (40 mg total) by mouth 2 (two) times daily. 60 tablet 6   REPATHA  SURECLICK 140 MG/ML SOAJ ADMINISTER 1 ML UNDER THE SKIN EVERY 14 DAYS 6 mL 1   rOPINIRole  (REQUIP ) 3 MG tablet TAKE ONE TABLET BY MOUTH EVERY MORNING, ,NOON AND AT BEDTIME. (PRT PT MORNING,EVENING,BEDTIME) (Patient taking differently: Take 3-6 mg by mouth See admin instructions. Take 1 tablet (3mg ) every morning and 2 tablets (6mg ) at bedtime.) 270 tablet 3   Semaglutide , 2 MG/DOSE, 8 MG/3ML SOPN Inject 2 mg as directed once a week. 9 mL 1   tiZANidine  (ZANAFLEX ) 2 MG tablet Take 2 mg by mouth 2 (two) times daily.     triamcinolone  cream (KENALOG) 0.1 % SMARTSIG:sparingly Topical Twice Daily     ARIPiprazole  (ABILIFY ) 5 MG tablet Take 1 tablet (5 mg total) by mouth daily. 90 tablet 2   buPROPion  (WELLBUTRIN  XL) 150 MG 24 hr tablet Take 1 tablet (150 mg total) by mouth every morning. 90 tablet 2   lamoTRIgine  (LAMICTAL ) 150 MG tablet Take 1 tablet (150 mg total) by mouth at bedtime. 90 tablet 2   venlafaxine  XR (EFFEXOR -XR) 150 MG 24 hr capsule TAKE TWO (2) CAPSULES BY MOUTH AT BEDTIME 180 capsule 2   No current facility-administered medications for this visit.     Musculoskeletal: Strength & Muscle Tone: decreased Gait & Station: unsteady Patient leans: N/A  Psychiatric  Specialty Exam: Review of Systems  Musculoskeletal:  Positive for gait problem.  Neurological:  Positive for weakness.  All other systems reviewed and are negative.   Blood pressure 108/60, pulse 61, height 5' (1.524 m), weight 241 lb 6.4 oz (109.5 kg), SpO2 94%.Body mass index is 47.15 kg/m.  General Appearance: Casual and Fairly Groomed  Eye Contact:  Good  Speech:  Clear and Coherent  Volume:  Normal  Mood:  Euthymic  Affect:  Congruent  Thought Process:  Goal Directed  Orientation:  Full (Time, Place, and Person)  Thought Content: WDL   Suicidal Thoughts:  No  Homicidal Thoughts:  No  Memory:  Immediate;   Good Recent;   Good Remote;   NA  Judgement:  Good  Insight:  Fair  Psychomotor Activity:  Decreased  Concentration:  Concentration: Good and Attention Span: Good  Recall:  Good  Fund of Knowledge: Good  Language: Good  Akathisia:  No  Handed:  Right  AIMS (if indicated): not done  Assets:  Communication Skills Desire for Improvement Resilience Social Support  ADL's:  Intact  Cognition: WNL  Sleep:  Good   Screenings: GAD-7    Flowsheet Row Office Visit from 03/24/2024 in [facility] [place]  Total GAD-7 Score 2   Mini-Mental    Flowsheet Row Office Visit from 02/18/2018 in [facility] Office Visit from 09/17/2017 in [facility]  Total Score (max 30 points ) 28 28   PHQ2-9    Flowsheet Row Office Visit from 03/24/2024 in [facility] [place] Video Visit from 08/13/2022 in [facility] at [place] Video Visit from 01/21/2022 in [facility] at [place] Video Visit from 11/26/2021 in [facility] at [facility] Coordination from 11/14/2021 in [facility]  PHQ-2 Total Score 2 1 1 4 2   PHQ-9 Total Score 4 --  -- 9 11   Flowsheet Row ED to Hosp-Admission (Discharged) from 04/13/2023 in [facility] Admission (Discharged) from 04/10/2023 in [facility] Video Visit from 08/13/2022 in [facility] at [place]  C-SSRS RISK CATEGORY No Risk No Risk No Risk     Assessment and Plan: This patient is a 73 year old female with a history of major depression bipolar disorder and insomnia.  She continues to do well on her current regimen.  She will continue Wellbutrin  XL 150 mg daily as well as Effexor  XR 300 mg daily for major depression Abilify  5 mg daily for augmentation and Lamictal  100 mg daily for bipolar disorder.  She will return to see me in 6 months  Collaboration of Care: Collaboration of Care: Primary Care Provider AEB notes will be shared with PCP at patient's request  Patient/Guardian was advised Release of Information must be obtained prior to any record release in order to collaborate their care with an outside provider. Patient/Guardian was advised if they have not already done so to contact the registration department to sign all necessary forms in order for us  to release information regarding their care.   Consent: Patient/Guardian gives verbal consent for treatment and assignment of benefits for services provided during this visit. Patient/Guardian expressed understanding and agreed to proceed.    Barnie Gull, MD 03/24/2024, 11:18 AM

## 2024-03-26 ENCOUNTER — Ambulatory Visit: Admitting: Orthopedic Surgery

## 2024-04-04 ENCOUNTER — Other Ambulatory Visit (INDEPENDENT_AMBULATORY_CARE_PROVIDER_SITE_OTHER): Payer: Self-pay | Admitting: Gastroenterology

## 2024-05-06 ENCOUNTER — Encounter: Payer: Self-pay | Admitting: "Endocrinology

## 2024-05-06 ENCOUNTER — Ambulatory Visit: Admitting: "Endocrinology

## 2024-05-06 VITALS — BP 116/62 | HR 60 | Ht 60.0 in | Wt 237.4 lb

## 2024-05-06 DIAGNOSIS — Z7984 Long term (current) use of oral hypoglycemic drugs: Secondary | ICD-10-CM | POA: Diagnosis not present

## 2024-05-06 DIAGNOSIS — E038 Other specified hypothyroidism: Secondary | ICD-10-CM

## 2024-05-06 DIAGNOSIS — I10 Essential (primary) hypertension: Secondary | ICD-10-CM

## 2024-05-06 DIAGNOSIS — E1169 Type 2 diabetes mellitus with other specified complication: Secondary | ICD-10-CM

## 2024-05-06 DIAGNOSIS — E559 Vitamin D deficiency, unspecified: Secondary | ICD-10-CM

## 2024-05-06 DIAGNOSIS — E785 Hyperlipidemia, unspecified: Secondary | ICD-10-CM | POA: Diagnosis not present

## 2024-05-06 DIAGNOSIS — Z794 Long term (current) use of insulin: Secondary | ICD-10-CM | POA: Diagnosis not present

## 2024-05-06 DIAGNOSIS — E1159 Type 2 diabetes mellitus with other circulatory complications: Secondary | ICD-10-CM | POA: Diagnosis not present

## 2024-05-06 LAB — POCT GLYCOSYLATED HEMOGLOBIN (HGB A1C): HbA1c, POC (controlled diabetic range): 8.3 % — AB (ref 0.0–7.0)

## 2024-05-06 MED ORDER — TOUJEO SOLOSTAR 300 UNIT/ML ~~LOC~~ SOPN: 20.0000 [IU] | PEN_INJECTOR | Freq: Every evening | SUBCUTANEOUS | 1 refills | Status: AC

## 2024-05-06 MED ORDER — EMPAGLIFLOZIN 25 MG PO TABS: 25.0000 mg | ORAL_TABLET | Freq: Every day | ORAL | 1 refills | Status: AC

## 2024-05-06 NOTE — Progress Notes (Signed)
 "                                                                                      05/06/2024, 12:51 PM   Endocrinology follow-up note  Subjective:    Patient ID: Norma [name], female    DOB: August 12, 1950.  Norma [name] is being seen in follow-up after she was seen in consultation for management of currently uncontrolled symptomatic diabetes requested by  Shona Norleen PEDLAR, MD.   Past Medical History:  Diagnosis Date   Anemia    Anxiety    Bipolar disorder (HCC)    Bulging lumbar disc    L3-4   Chronic daily headache    Chronic low back pain 09/20/2014   CKD (chronic kidney disease)    stage 3   COPD (chronic obstructive pulmonary disease) (HCC)    Coronary artery disease    CABG 2019   Degenerative arthritis    Depression    Diabetes mellitus, type II (HCC)    Diabetic neuropathy (HCC) 09/16/2018   DM type 2 with diabetic peripheral neuropathy (HCC) 09/20/2014   Dyslipidemia    Dyspnea    Family history of adverse reaction to anesthesia    father had reaction to anesthesia medication per patient they don't use it anymore- unsure of reaction   Fatty liver    per pt report   Gastroparesis    GERD (gastroesophageal reflux disease)    Heart murmur    History of hiatal hernia    pt states she no longer has this   Hypertension    Hypothyroidism    IBS (irritable bowel syndrome)    LBBB (left bundle branch block)    Memory difficulty 09/20/2014   Morbid obesity (HCC)    Myocardial infarction (HCC)    per pt- prior to CABG   Neuropathy    Obstructive sleep apnea on CPAP    Renal insufficiency    Restless legs syndrome (RLS) 09/17/2012   Stroke (cerebrum) (HCC) 05/16/2017   Left parietal   Urticaria     Past Surgical History:  Procedure Laterality Date   ABDOMINAL HYSTERECTOMY     ARTHROSCOPY KNEE W/ DRILLING  06/2011   and Decemer of 2013.   Carpel tunnel  1980's   CATARACT EXTRACTION W/PHACO Right 03/21/2017   Procedure: CATARACT EXTRACTION PHACO AND  INTRAOCULAR LENS PLACEMENT RIGHT EYE;  Surgeon: Harrie Agent, MD;  Location: [facility];  Service: Ophthalmology;  Laterality: Right;  CDE: 2.91    CATARACT EXTRACTION W/PHACO Left 04/18/2017   Procedure: CATARACT EXTRACTION PHACO AND INTRAOCULAR LENS PLACEMENT LEFT EYE;  Surgeon: Harrie Agent, MD;  Location: [facility];  Service: Ophthalmology;  Laterality: Left;  left   CHOLECYSTECTOMY     COLONOSCOPY WITH PROPOFOL  N/A 10/04/2016   Procedure: COLONOSCOPY WITH PROPOFOL ;  Surgeon: Golda Claudis PENNER, MD;  Location: [facility];  Service: Endoscopy;  Laterality: N/A;  11:10   CORONARY ARTERY BYPASS GRAFT N/A 08/29/2017   Procedure: CORONARY ARTERY BYPASS GRAFTING (CABG) x 3 WITH ENDOSCOPIC HARVESTING OF RIGHT SAPHENOUS VEIN;  Surgeon: Fleeta Hanford Coy, MD;  Location: [facility] OR;  Service: Open Heart Surgery;  Laterality:  N/A;   ESOPHAGOGASTRODUODENOSCOPY N/A 04/25/2016   Procedure: ESOPHAGOGASTRODUODENOSCOPY (EGD);  Surgeon: Claudis RAYMOND Rivet, MD;  Location: [facility];  Service: Endoscopy;  Laterality: N/A;  730   ESOPHAGOGASTRODUODENOSCOPY  03/2023   LEFT HEART CATH AND CORONARY ANGIOGRAPHY N/A 08/27/2017   Procedure: LEFT HEART CATH AND CORONARY ANGIOGRAPHY;  Surgeon: Wonda Sharper, MD;  Location: [facility] INVASIVE CV LAB;  Service: Cardiovascular::  pLAD 95% - p-mLAD 50%, ostD1 90% -pD1 80%; ostOM1 90%; rPDA 80%. EF ~50-55% - HK of dital Anterolateral & Apical wall.  - Rec CVTS c/s   NECK SURGERY     pt reports having growth removed from the back of her neck- after 2021   OPEN REDUCTION INTERNAL FIXATION (ORIF) HAND Left 04/10/2020   Procedure: OPEN REDUCTION INTERNAL FIXATION (ORIF) HAND, left index finger;  Surgeon: Margrette Taft BRAVO, MD;  Location: [facility];  Service: Orthopedics;  Laterality: Left;  0.045 k wires   RECTAL SURGERY     fissure   SHOULDER SURGERY Left    arthroscopy in March of this year   TEE WITHOUT CARDIOVERSION N/A 08/29/2017   Procedure: TRANSESOPHAGEAL ECHOCARDIOGRAM (TEE);   Surgeon: Fleeta Ochoa, Maude, MD;  Location: [facility] OR;  Service: Open Heart Surgery;  Laterality: N/A;   TRANSFORAMINAL LUMBAR INTERBODY FUSION W/ MIS 1 LEVEL Right 07/12/2021   Procedure: Minimally Invasive Surgery Transforaminal Lumbar Interbody Fusion  Lumbar four-five;  Surgeon: Debby Dorn MATSU, MD;  Location: [facility] OR;  Service: Neurosurgery;  Laterality: Right;   TRANSTHORACIC ECHOCARDIOGRAM  06/06/2017   Mild to moderate reduced EF 40 and 45%.  Anterior septal, inferoseptal and basal to mid inferior hypokinesis.  GR 1 DD.  No significant valvular lesion    Social History   Socioeconomic History   Marital status: Single    Spouse name: Not on file   Number of children: 0   Years of education: 16   Highest education level: Not on file  Occupational History    Employer: [employer]  Tobacco Use   Smoking status: Former    Current packs/day: 0.00    Types: Cigarettes    Quit date: 02/04/1971    Years since quitting: 53.2   Smokeless tobacco: Never   Tobacco comments:    smoked 2 cigarettes a day  Vaping Use   Vaping status: Never Used  Substance and Sexual Activity   Alcohol use: No    Alcohol/week: 0.0 standard drinks of alcohol   Drug use: No   Sexual activity: Never  Other Topics Concern   Not on file  Social History Narrative   Patient lives at home alone.    Patient has no children.    Patient has her masters in nursing.    Patient is single.    Patient drinks about 2 glasses of tea daily.   Patient is right handed.   Social Drivers of Health   Tobacco Use: Medium Risk (05/06/2024)   Patient History    Smoking Tobacco Use: Former    Smokeless Tobacco Use: Never    Passive Exposure: Not on file  Financial Resource Strain: Medium Risk (12/18/2021)   Overall Financial Resource Strain (CARDIA)    Difficulty of Paying Living Expenses: Somewhat hard  Food Insecurity: No Food Insecurity (04/13/2023)   Hunger Vital Sign    Worried About Running Out of Food in  the Last Year: Never true    Ran Out of Food in the Last Year: Never true  Transportation Needs: No Transportation Needs (12/18/2023)  Received from [facility] - Transportation    Lack of Transportation (Medical): No    Lack of Transportation (Non-Medical): No  Physical Activity: Not on file  Stress: No Stress Concern Present (10/04/2021)   [employer] - Occupational Stress Questionnaire    Feeling of Stress : Only a little  Social Connections: Not on file  Depression (PHQ2-9): Low Risk (03/24/2024)   Depression (PHQ2-9)    PHQ-2 Score: 4  Alcohol Screen: Not on file  Housing: Low Risk (04/13/2023)   Housing Stability Vital Sign    Unable to Pay for Housing in the Last Year: No    Number of Times Moved in the Last Year: 0    Homeless in the Last Year: No  Utilities: Not At Risk (04/13/2023)   [facility] Utilities    Threatened with loss of utilities: No  Health Literacy: Medium Risk (12/18/2023)   Received from [facility] Literacy    How often do you need to have someone help you when you read instructions, pamphlets, or other written material from your doctor or pharmacy?: Rarely    Family History  Problem Relation Age of Onset   Hypertension Mother    Lymphoma Mother    Depression Mother    Arthritis Father    Alcohol abuse Father    Hypertension Sister    Cancer Brother        kidney and lung   Alcohol abuse Brother    Alcohol abuse Paternal Uncle    Alcohol abuse Paternal Grandfather    Alcohol abuse Paternal Grandmother    Allergic rhinitis Neg Hx    Angioedema Neg Hx    Asthma Neg Hx    Atopy Neg Hx    Eczema Neg Hx    Immunodeficiency Neg Hx    Urticaria Neg Hx     Outpatient Encounter Medications as of 05/06/2024  Medication Sig   insulin  glargine, 1 Unit Dial, (TOUJEO  SOLOSTAR) 300 UNIT/ML Solostar Pen Inject 20 Units into the skin at bedtime.   acetaminophen  (TYLENOL ) 500 MG tablet Take 1,000 mg by mouth  daily as needed for moderate pain (pain score 4-6), fever or headache.   ARIPiprazole  (ABILIFY ) 5 MG tablet Take 1 tablet (5 mg total) by mouth daily.   aspirin  EC 81 MG tablet Take 1 tablet (81 mg total) by mouth daily.   azelastine (ASTELIN) 0.1 % nasal spray Place 1 spray into both nostrils 2 (two) times daily as needed for allergies.   buPROPion  (WELLBUTRIN  XL) 150 MG 24 hr tablet Take 1 tablet (150 mg total) by mouth every morning.   cetirizine  (ZYRTEC ) 10 MG chewable tablet Chew 10 mg by mouth daily.   diclofenac Sodium (VOLTAREN) 1 % GEL Apply 2 g topically 4 (four) times daily as needed (pain).   EASY TOUCH PEN NEEDLES 31G X 6 MM MISC USE AS DIRECTED with Toujeo  (she wants 6mm)   empagliflozin  (JARDIANCE ) 25 MG TABS tablet Take 1 tablet (25 mg total) by mouth daily with breakfast.   EPINEPHrine  0.3 mg/0.3 mL IJ SOAJ injection Inject 0.3 mg into the muscle as needed.   FT VITAMIN D3 50 MCG TABS TAKE 1 TABLET BY MOUTH EVERY MORNING   gabapentin  (NEURONTIN ) 400 MG capsule Take 400-800 mg by mouth See admin instructions. Take 1 capsule (400mg ) every morning and 2 capsules (800mg ) at bedtime.   lamoTRIgine  (LAMICTAL ) 150 MG tablet Take 1 tablet (150 mg total) by mouth  at bedtime.   levothyroxine  (SYNTHROID ) 112 MCG tablet Take 112 mcg by mouth daily.   linaclotide (LINZESS) 145 MCG CAPS capsule Take 145 mcg by mouth daily before breakfast.   losartan  (COZAAR ) 25 MG tablet Take 25 mg by mouth daily.   pantoprazole  (PROTONIX ) 40 MG tablet TAKE 1 TABLET BY MOUTH EVERY EVENING   propranolol  (INDERAL ) 40 MG tablet Take 1 tablet (40 mg total) by mouth 2 (two) times daily.   REPATHA  SURECLICK 140 MG/ML SOAJ ADMINISTER 1 ML UNDER THE SKIN EVERY 14 DAYS   rOPINIRole  (REQUIP ) 3 MG tablet TAKE ONE TABLET BY MOUTH EVERY MORNING, ,NOON AND AT BEDTIME. (PRT PT MORNING,EVENING,BEDTIME) (Patient taking differently: Take 3-6 mg by mouth See admin instructions. Take 1 tablet (3mg ) every morning and 2 tablets  (6mg ) at bedtime.)   tiZANidine  (ZANAFLEX ) 2 MG tablet Take 2 mg by mouth 2 (two) times daily.   triamcinolone  cream (KENALOG) 0.1 % SMARTSIG:sparingly Topical Twice Daily   venlafaxine  XR (EFFEXOR -XR) 150 MG 24 hr capsule TAKE TWO (2) CAPSULES BY MOUTH AT BEDTIME   [DISCONTINUED] JARDIANCE  10 MG TABS tablet TAKE 1 TABLET (10 MG TOTAL) BY MOUTH DAILY WITH BREAKFAST.   [DISCONTINUED] Semaglutide , 2 MG/DOSE, 8 MG/3ML SOPN Inject 2 mg as directed once a week. (Patient not taking: Reported on 05/06/2024)   No facility-administered encounter medications on file as of 05/06/2024.    ALLERGIES: Allergies  Allergen Reactions   Amoxil [Amoxicillin] Anaphylaxis   Hydrocodone Anaphylaxis   Percocet [Oxycodone -Acetaminophen ] Other (See Comments)    Causes chest pain /tightness. Pressure pain around her ribs.   Depacon [Valproic Acid] Other (See Comments)    Causes falls    Prednisone  Itching   Roxicodone  [Oxycodone ] Other (See Comments)    Chest pain, tightness    VACCINATION STATUS: Immunization History  Administered Date(s) Administered    Astrazeneca Covid-19 Vaccine, Pf, 0.5 Ml Non Us   01/29/2023   Influenza Inj Mdck Quad Pf 02/12/2016   Influenza,inj,Quad PF,6+ Mos 01/20/2018   Influenza,inj,quad, With Preservative 04/25/2022   Influenza-Unspecified 01/29/2023   Janssen (J&J) SARS-COV-2 Vaccination 03/17/2020   PNEUMOCOCCAL CONJUGATE-20 05/01/2023   Pneumococcal Polysaccharide-23 01/30/2015   Td,absorbed, Preservative Free, Adult Use, Lf Unspecified 07/31/1999   Tdap 07/31/1999, 04/08/2020    Diabetes She presents for her follow-up diabetic visit. She has type 2 diabetes mellitus. Onset time: She was diagnosed at approximate age of 47 years.  This patient was previously seen in this clinic for diabetic management, last visit was in 2021. Her disease course has been worsening. There are no hypoglycemic associated symptoms. Pertinent negatives for hypoglycemia include no confusion,  headaches, pallor or seizures. Associated symptoms include fatigue. Pertinent negatives for diabetes include no blurred vision, no chest pain, no polydipsia, no polyphagia and no polyuria. There are no hypoglycemic complications. Symptoms are worsening. Diabetic complications include heart disease and nephropathy. Risk factors for coronary artery disease include dyslipidemia, diabetes mellitus, hypertension, obesity, family history, post-menopausal, sedentary lifestyle and tobacco exposure. Her weight is decreasing steadily. She is following a generally unhealthy diet. When asked about meal planning, she reported none. She has had a previous visit with a dietitian. She rarely participates in exercise. Her home blood glucose trend is increasing steadily. Her breakfast blood glucose range is generally 180-200 mg/dl. Her lunch blood glucose range is generally 180-200 mg/dl. Her dinner blood glucose range is generally 180-200 mg/dl. Her bedtime blood glucose range is generally 180-200 mg/dl. Her overall blood glucose range is 180-200 mg/dl. (She presents with loss of  control of glycemic profile.  Her average blood glucose is 201 for the most recent 14 days with 21.3% glucose variability.  Her AGP report shows 37% time in range, 49% Libre 1 hyperglycemia, 14% level 2 hyperglycemia.  She has no hypoglycemia.  She lost coverage for GLP-1 receptor agonist.  Her point-of-care A1c is 8.3% increasing from 7.2% during her last visit.      ) An ACE inhibitor/angiotensin II receptor blocker is being taken. Eye exam is current.  Hypertension This is a chronic problem. Pertinent negatives include no blurred vision, chest pain, headaches, palpitations or shortness of breath. Risk factors for coronary artery disease include dyslipidemia, diabetes mellitus, family history, obesity, post-menopausal state, smoking/tobacco exposure and sedentary lifestyle. Past treatments include angiotensin blockers. Hypertensive end-organ damage  includes CAD/MI. Identifiable causes of hypertension include chronic renal disease.    Objective:       05/06/2024   11:11 AM 03/24/2024   10:57 AM 03/18/2024   11:13 AM  Vitals with BMI  Height 5' 0  5' 0  Weight 237 lbs 6 oz  243 lbs  BMI 46.36  47.46  Systolic 116  111  Diastolic 62  65  Pulse 60  69     Information is confidential and restricted. Go to Review Flowsheets to unlock data.    BP 116/62   Pulse 60   Ht 5' (1.524 m)   Wt 237 lb 6.4 oz (107.7 kg)   BMI 46.36 kg/m   Wt Readings from Last 3 Encounters:  05/06/24 237 lb 6.4 oz (107.7 kg)  03/18/24 243 lb (110.2 kg)  02/04/24 242 lb 12.8 oz (110.1 kg)       CMP ( most recent) CMP     Component Value Date/Time   NA 140 03/18/2024 1149   K 4.8 03/18/2024 1149   CL 100 03/18/2024 1149   CO2 24 03/18/2024 1149   GLUCOSE 144 (H) 03/18/2024 1149   GLUCOSE 186 (H) 04/14/2023 0449   BUN 19 03/18/2024 1149   CREATININE 1.26 (H) 03/18/2024 1149   CREATININE 1.10 (H) 03/05/2019 1221   CALCIUM  9.5 03/18/2024 1149   PROT 8.2 (H) 04/13/2023 1846   PROT 6.9 01/11/2021 1014   ALBUMIN  3.8 04/22/2023 0000   ALBUMIN  4.1 01/11/2021 1014   AST 11 (A) 04/22/2023 0000   ALT 12 04/22/2023 0000   ALKPHOS 81 04/22/2023 0000   BILITOT 0.7 04/13/2023 1846   BILITOT 0.2 01/11/2021 1014   GFRNONAA 42 (L) 04/14/2023 0449   GFRNONAA 23 10/09/2022 0000   GFRNONAA 53 (L) 03/05/2019 1221   GFRAA 56 (L) 04/23/2019 1511   GFRAA 61 03/05/2019 1221     Diabetic Labs (most recent): Lab Results  Component Value Date   HGBA1C 8.3 (A) 05/06/2024   HGBA1C 7.7 04/22/2023   HGBA1C 7.8 (A) 02/04/2023   MICROALBUR 0.9 01/29/2018   MICROALBUR 1.5 07/10/2016     Lipid Panel ( most recent) Lipid Panel     Component Value Date/Time   CHOL 173 04/22/2023 0000   TRIG 147 04/22/2023 0000   HDL 41 04/22/2023 0000   CHOLHDL 3.8 03/05/2019 1221   VLDL 31 (H) 07/10/2016 0934   LDLCALC 106 04/22/2023 0000   LDLCALC 152 (H)  03/05/2019 1221      Lab Results  Component Value Date   TSH 2.970 03/18/2024   TSH 1.79 04/23/2023   TSH 1.79 04/22/2023   TSH 1.304 04/14/2023   TSH 1.73 10/09/2022   TSH 1.49 03/05/2019  TSH 2.671 10/30/2018   TSH 3.05 04/24/2018   TSH 2.16 01/29/2018   TSH 3.71 07/11/2017   FREET4 1.37 04/23/2023   FREET4 1.2 03/05/2019   FREET4 1.05 10/30/2018   FREET4 1.2 01/29/2018   FREET4 1.4 07/11/2017   FREET4 1.0 07/10/2016   FREET4 1.22 05/12/2015       Assessment & Plan:   1. DM type 2 causing vascular disease  - RACHELE LAMASTER has currently uncontrolled symptomatic type 2 DM since  74 years of age.  She presents with loss of control of glycemic profile.  Her average blood glucose is 201 for the most recent 14 days with 21.3% glucose variability.  Her AGP report shows 37% time in range, 49% Libre 1 hyperglycemia, 14% level 2 hyperglycemia.  She has no hypoglycemia.  She lost coverage for GLP-1 receptor agonist.  Her point-of-care A1c is 8.3% increasing from 7.2% during her last visit.    - Recent labs reviewed. - I had a long discussion with her about the possible risk factors and  the pathology behind its diabetes and its complications. -her diabetes is complicated by coronary artery disease, CKD, obesity/sedentary life, polypharmacy, and she remains at a high risk for more acute and chronic complications which include CAD, CVA, CKD, retinopathy, and neuropathy. These are all discussed in detail with her.  - I discussed all available options of managing her diabetes including de-escalation of medications. I have counseled her on Food as Medicine by adopting a Whole Food , Plant Predominant  ( WFPP) nutrition as recommended by [employer] of Lifestyle Medicine. Patient is encouraged to switch to  unprocessed or minimally processed  complex starch, adequate protein intake (mainly plant source), minimal liquid fat, plenty of fruits, and vegetables. -  she is advised to stick  to a routine mealtimes to eat 3 complete meals a day and snack only when necessary ( to snack only to correct hypoglycemia BG <70 day time or <100 at night).   -Her engagement with lifestyle medicine nutrition is suboptimal, however she acknowledges that there is a room for improvement in her food and drink choices.  - Suggestion is made for her to avoid simple carbohydrates  from her diet including Cakes, Sweet Desserts, Ice Cream, Soda (diet and regular), Sweet Tea, Candies, Chips, Cookies, Store Bought Juices, Alcohol , Artificial Sweeteners,  Coffee Creamer, and Sugar-free Products, Lemonade. This will help patient to have more stable blood glucose profile and potentially avoid unintended weight gain.    - I have approached her with the following individualized plan to manage  her diabetes and patient agrees:   -She has lost coverage for GLP-1 receptor agonist.  She may benefit from resumption of her basal insulin .  I discussed and prescribed Toujeo  20 units nightly. -She will also benefit from a higher dose of Jardiance .  I discussed and prescribed Jardiance  25 mg p.o. daily at breakfast.   - she is encouraged to continue to use her CGM, and call clinic for blood glucose levels less than 70 or above 180 mg /dl weekly average.  - Specific targets for  A1c;  LDL, HDL,  and Triglycerides were discussed with the patient.  2) Blood Pressure /Hypertension:   -Her blood pressure is controlled target.   she is advised to continue her current medications including losartan  50 mg p.o. daily with breakfast . 3) Lipids/Hyperlipidemia:   Review of her recent lipid panel showed uncontrolled lipid panel with LDL at 106.  she  is advised to continue Repatha  140 mg subcutaneously every other week.  4)  Weight/Diet:  Body mass index is 46.36 kg/m.  -   clearly complicating her diabetes care.   she is  a candidate for weight loss.  I discussed with her the fact that loss of 5 - 10% of her  current  body weight will have the most impact on her diabetes management.  The above detailed  ACLM recommendations for nutrition, exercise, sleep, social life, avoidance of risky substances, the need for restorative sleep   information will also detailed on discharge instructions.  5) hypothyroidism: Longstanding diagnosis.  No recent thyroid  function test to review.  She is advised to continue levothyroxine  112 mcg p.o. daily before breakfast.     - We discussed about the correct intake of her thyroid  hormone, on empty stomach at fasting, with water , separated by at least 30 minutes from breakfast and other medications,  and separated by more than 4 hours from calcium , iron, multivitamins, acid reflux medications (PPIs). -Patient is made aware of the fact that thyroid  hormone replacement is needed for life, dose to be adjusted by periodic monitoring of thyroid  function tests.   6) vitamin D  deficiency: She is status post treatment with vitamin D  2 50,000 units for several weeks.  she is advised to switch to vitamin D3 2000 units daily for maintenance.   7) Chronic Care/Health Maintenance:  -she  is on ARB  medications and  is encouraged to initiate and continue to follow up with Ophthalmology, Dentist,  Podiatrist at least yearly or according to recommendations, and advised to   stay away from smoking. I have recommended yearly flu vaccine and pneumonia vaccine at least every 5 years; moderate intensity exercise for up to 150 minutes weekly; and  sleep for 7- 9 hours a day.  - she is  advised to maintain close follow up with Shona Norleen PEDLAR, MD for primary care needs, as well as her other providers for optimal and coordinated care.  I spent  31  minutes in the care of the patient today including review of labs from CMP, Lipids, Thyroid  Function, Hematology (current and previous including abstractions from other facilities); face-to-face time discussing  her blood glucose readings/logs, discussing  hypoglycemia and hyperglycemia episodes and symptoms, medications doses, her options of short and long term treatment based on the latest standards of care / guidelines;  discussion about incorporating lifestyle medicine;  and documenting the encounter. Risk reduction counseling performed per USPSTF guidelines to reduce  obesity and cardiovascular risk factors.     Please refer to Patient Instructions for Blood Glucose Monitoring and Insulin /Medications Dosing Guide  in media tab for additional information. Please  also refer to  Patient Self Inventory in the Media  tab for reviewed elements of pertinent patient history.  Norma [name] participated in the discussions, expressed understanding, and voiced agreement with the above plans.  All questions were answered to her satisfaction. she is encouraged to contact clinic should she have any questions or concerns prior to her return visit. Dear Patient: Feel free to review your progress notes.  If you are reviewing this progress note and have questions about the meaning of /or medical terms being used, please make a note and address it at your next follow-up appointment.  Medical notes are meant to be a communication tool between medical professionals and require medical terms to be used for efficiency and insurance approval.    Follow up plan: -  Return in about 4 months (around 09/03/2024) for Bring Meter/CGM Device/Logs- A1c in Office.  Ranny Earl, MD [facility] [address] [place], [place] [address] Phone: [phone]  Fax: [phone]    05/06/2024, 12:51 PM  This note was partially dictated with voice recognition software. Similar sounding words can be transcribed inadequately or may not  be corrected upon review.  "

## 2024-05-06 NOTE — Patient Instructions (Signed)

## 2024-05-07 ENCOUNTER — Telehealth: Payer: Self-pay | Admitting: "Endocrinology

## 2024-05-07 NOTE — Telephone Encounter (Signed)
 Pt states she is on Touejo and that is [employer]. She needs that form. We gave her NovoNordisk

## 2024-05-11 NOTE — Telephone Encounter (Signed)
 Spoke to pt advising her I do have the forms for [employer] and she can come by the office at her convenience to fill out her part. Pt voiced understanding.

## 2024-05-18 DIAGNOSIS — M5416 Radiculopathy, lumbar region: Secondary | ICD-10-CM | POA: Insufficient documentation

## 2024-05-20 ENCOUNTER — Ambulatory Visit: Admitting: Orthopedic Surgery

## 2024-06-03 ENCOUNTER — Ambulatory Visit: Admitting: Orthopedic Surgery

## 2024-07-30 ENCOUNTER — Ambulatory Visit: Admitting: Nurse Practitioner

## 2024-09-09 ENCOUNTER — Ambulatory Visit: Admitting: "Endocrinology

## 2024-09-13 ENCOUNTER — Telehealth (HOSPITAL_COMMUNITY): Admitting: Psychiatry
